# Patient Record
Sex: Male | Born: 1990
Health system: Southern US, Community
[De-identification: ages and names within clinical notes are randomized; demographics above are authoritative.]

## PROBLEM LIST (undated history)

## (undated) DIAGNOSIS — I10 Essential (primary) hypertension: Secondary | ICD-10-CM

## (undated) DIAGNOSIS — Z992 Dependence on renal dialysis: Secondary | ICD-10-CM

## (undated) DIAGNOSIS — D649 Anemia, unspecified: Secondary | ICD-10-CM

## (undated) DIAGNOSIS — N189 Chronic kidney disease, unspecified: Secondary | ICD-10-CM

## (undated) DIAGNOSIS — E039 Hypothyroidism, unspecified: Secondary | ICD-10-CM

## (undated) DIAGNOSIS — J9601 Acute respiratory failure with hypoxia: Secondary | ICD-10-CM

## (undated) DIAGNOSIS — E785 Hyperlipidemia, unspecified: Secondary | ICD-10-CM

## (undated) DIAGNOSIS — E079 Disorder of thyroid, unspecified: Secondary | ICD-10-CM

## (undated) DIAGNOSIS — N186 End stage renal disease: Secondary | ICD-10-CM

## (undated) DIAGNOSIS — T884XXA Failed or difficult intubation, initial encounter: Secondary | ICD-10-CM

## (undated) DIAGNOSIS — K219 Gastro-esophageal reflux disease without esophagitis: Secondary | ICD-10-CM

## (undated) DIAGNOSIS — S31000A Unspecified open wound of lower back and pelvis without penetration into retroperitoneum, initial encounter: Secondary | ICD-10-CM

## (undated) DIAGNOSIS — T8859XA Other complications of anesthesia, initial encounter: Secondary | ICD-10-CM

## (undated) HISTORY — DX: Disorder of thyroid, unspecified: E07.9

## (undated) HISTORY — DX: Essential (primary) hypertension: I10

---
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
## 2012-01-09 ENCOUNTER — Other Ambulatory Visit: Payer: Self-pay | Admitting: *Deleted

## 2012-01-09 MED ORDER — LISINOPRIL-HYDROCHLOROTHIAZIDE 20-12.5 MG PO TABS: 2.0000 | ORAL_TABLET | Freq: Every day | ORAL | Status: DC

## 2012-01-09 NOTE — Telephone Encounter (Signed)
Called and informed mom of Rx sent into pharmacy and that he will need an office visit with PCP for any further refills.Paul [name], [name]

## 2012-01-09 NOTE — Telephone Encounter (Signed)
Will refill x1 month. Call pt and let him know he needs a follow up appointment.  NO further refills unless he is seen by me, or at least has an appt to be seen.

## 2012-02-01 ENCOUNTER — Encounter: Payer: Self-pay | Admitting: Family Medicine

## 2012-02-22 ENCOUNTER — Other Ambulatory Visit: Payer: Self-pay | Admitting: Family Medicine

## 2012-02-22 MED ORDER — LISINOPRIL-HYDROCHLOROTHIAZIDE 20-12.5 MG PO TABS: 2.0000 | ORAL_TABLET | Freq: Every day | ORAL | Status: DC

## 2012-02-22 NOTE — Telephone Encounter (Signed)
Mom is calling for a refill on Lisinopril to got to [facility] on [address].  She has schedule him to be seen on 10/18 and would like some meds to last until then.

## 2012-02-22 NOTE — Telephone Encounter (Signed)
Given 3 week supply.

## 2012-02-22 NOTE — Telephone Encounter (Signed)
Will fwd. RX refill request to [place]. Javier Glazier, Gerrit Heck

## 2012-03-05 ENCOUNTER — Other Ambulatory Visit: Payer: Self-pay | Admitting: *Deleted

## 2012-03-05 DIAGNOSIS — E039 Hypothyroidism, unspecified: Secondary | ICD-10-CM

## 2012-03-05 MED ORDER — LEVOTHYROXINE SODIUM 150 MCG PO TABS: 150.0000 ug | ORAL_TABLET | Freq: Every day | ORAL | Status: DC

## 2012-03-09 ENCOUNTER — Ambulatory Visit: Payer: Medicaid Other | Admitting: Family Medicine

## 2012-03-14 ENCOUNTER — Other Ambulatory Visit: Payer: Self-pay | Admitting: *Deleted

## 2012-03-14 MED ORDER — LISINOPRIL-HYDROCHLOROTHIAZIDE 20-12.5 MG PO TABS: 2.0000 | ORAL_TABLET | Freq: Every day | ORAL | Status: DC

## 2012-03-14 NOTE — Telephone Encounter (Signed)
Patient has appointment scheduled for 03/28/2012 with Dr. Loraine Maple.  Lauralyn Primes

## 2012-03-14 NOTE — Telephone Encounter (Signed)
I will refill until the appt. This is the LAST TIME I will refill, even if he has an appt scheduled, because he has canceled and needs to be seen.  Please let pt and his mother know this. Thanks!

## 2012-03-28 ENCOUNTER — Ambulatory Visit: Payer: Medicaid Other | Admitting: Family Medicine

## 2012-03-30 ENCOUNTER — Telehealth: Payer: Self-pay | Admitting: Family Medicine

## 2012-03-30 NOTE — Telephone Encounter (Signed)
Pt is now out of his Lisinopril and has appt on 11/19 - needs enough to last until appt  [facility] [place]

## 2012-03-30 NOTE — Telephone Encounter (Signed)
Request DENIED.  Patient has no showed multiple times.  NO MEDS WILL BE PRESCRIBED UNTIL HE COMES IN FOR AN APPOINTMENT.

## 2012-04-10 ENCOUNTER — Ambulatory Visit (INDEPENDENT_AMBULATORY_CARE_PROVIDER_SITE_OTHER): Payer: Medicaid Other | Admitting: Family Medicine

## 2012-04-10 ENCOUNTER — Encounter: Payer: Self-pay | Admitting: Family Medicine

## 2012-04-10 VITALS — BP 146/91 | HR 112 | Ht 74.0 in | Wt 380.0 lb

## 2012-04-10 DIAGNOSIS — E785 Hyperlipidemia, unspecified: Secondary | ICD-10-CM

## 2012-04-10 DIAGNOSIS — I10 Essential (primary) hypertension: Secondary | ICD-10-CM

## 2012-04-10 DIAGNOSIS — E119 Type 2 diabetes mellitus without complications: Secondary | ICD-10-CM

## 2012-04-10 LAB — POCT GLYCOSYLATED HEMOGLOBIN (HGB A1C): Hemoglobin A1C: 8.9

## 2012-04-10 LAB — COMPREHENSIVE METABOLIC PANEL
Alkaline Phosphatase: 43 U/L (ref 39–117)
BUN: 14 mg/dL (ref 6–23)
CO2: 23 mEq/L (ref 19–32)
Creat: 1.03 mg/dL (ref 0.50–1.35)
Glucose, Bld: 128 mg/dL — ABNORMAL HIGH (ref 70–99)
Total Bilirubin: 0.2 mg/dL — ABNORMAL LOW (ref 0.3–1.2)

## 2012-04-10 NOTE — Assessment & Plan Note (Addendum)
Now primarily being managed by [facility]; last appt records faxed to [place] was from 01/25/12. Will not make any changes today. A1c unchanged at 8.9.  Pt reports he was supposed to f/u last month but didn't.  Is also not taking victoza, only taking lantus and metformin.

## 2012-04-10 NOTE — Progress Notes (Signed)
S: Pt comes in today for follow up.  HYPERTENSION BP: 146/91 Meds: amlodipine 10, lisinopril/HCTZ 40/25, was supposed to start coreg but mom refused, so now is on metoprolol 50 BID but not taking it Taking meds: Yes : out of lisinopril right now, never taken metoprolol    # of doses missed/week: 0 Symptoms: Headache: No Dizziness: No Vision changes: No SOB:  No Chest pain: No LE swelling: No Tobacco use: No   DIABETES Primarily Managed by [facility] now Home CBGs: AM usually 150's, PM usually 170-230; highest he can remember 260 or 270  Meds: per [facility] note: Lantus, metformin, victoza Taking Meds: yes- not taking Victoza  # of doses missed per week: 0 Hypoglycemic episodes?: no Symptoms: Polyuria: no Polydipsia: no Parasthesias: no   Dizziness: no  Nausea:  no Vomiting:  no Last A1c: TODAY 8.9   HYPERLIPIDEMIA and OBESITY Meds: tricor Muscle aches: no Last FLP or LDL:  Lab Results  Component Value Date   LDLCALC Comment:   Not calculated due to Triglyceride >400. Suggest ordering Direct LDL (Unit Code: [phone]).   Total Cholesterol/HDL Ratio:CHD Risk                        Coronary Heart Disease Risk Table                                        Men       Women          1/2 Average Risk              3.4        3.3              Average Risk              5.0        4.4           2X Average Risk              9.6        7.1           3X Average Risk             23.4       11.0 Use the calculated Patient Ratio above and the CHD Risk table  to determine the patient's CHD Risk. ATP III Classification (LDL):       < 100        mg/dL         Optimal      100 - 129     mg/dL         Near or Above Optimal      130 - 159     mg/dL         Borderline High      160 - 189     mg/dL         High       > 190        mg/dL         Very High   10/05/2011   Lab Results  Component Value Date   CHOL 253* 10/05/2011   HDL 41 10/05/2011   LDLCALC Comment:   Not calculated due to Triglyceride >400. Suggest  ordering Direct LDL (Unit Code: [phone]).   Total Cholesterol/HDL Ratio:CHD Risk  Coronary Heart Disease Risk Table                                        Men       Women          1/2 Average Risk              3.4        3.3              Average Risk              5.0        4.4           2X Average Risk              9.6        7.1           3X Average Risk             23.4       11.0 Use the calculated Patient Ratio above and the CHD Risk table  to determine the patient's CHD Risk. ATP III Classification (LDL):       < 100        mg/dL         Optimal      100 - 129     mg/dL         Near or Above Optimal      130 - 159     mg/dL         Borderline High      160 - 189     mg/dL         High       > 190        mg/dL         Very High   10/05/2011   LDLDIRECT 126* 04/14/2009   TRIG 581* 10/05/2011   CHOLHDL 6.2 10/05/2011    Diet: Trying to stay away from Sodas, drinking mostly Juice and Gatorade  Exercise: "I need to exercise more" hard because its colder out  Weight:  Wt Readings from Last 3 Encounters:  04/10/12 380 lb (172.367 kg) (99.99%*)  10/05/11 362 lb 5 oz (164.344 kg) (99.99%*)  04/12/11 357 lb 12.8 oz (162.297 kg) (99.98%*)   * Growth percentiles are based on CDC 2-20 Years data.       ROS: Per HPI  History  Smoking status  . Never Smoker   Smokeless tobacco  . Never Used    O:  Filed Vitals:   04/10/12 1108  BP: 146/91  Pulse: 112    Gen: NAD, obese CV: RRR, no murmur Pulm: CTA bilat, no wheezes or crackles Ext: Warm, no edema   A/P: 21 y.o. male p/w HTN, DM, HLD, obesity -See problem list -f/u in 1 month

## 2012-04-10 NOTE — Patient Instructions (Signed)
It was good to see you again (finally)!  We are checking some labs, I'll send you a letter with the results.  I am refilling your blood pressure medicine.  Please come back in ONE MONTH. We need to recheck your blood pressure and figure out what to do with your cholesterol and triglycerides.

## 2012-04-10 NOTE — Assessment & Plan Note (Signed)
Slightly improved today even though he has been out of lisino/HCTZ x2 weeks.  Max'ed out on 3 agents (norvasc, lisino/HCTZ) and mom will not allow [place] to start him on BB. Recheck BP in 1 month, although not confident pt will actually follow up.

## 2012-04-10 NOTE — Assessment & Plan Note (Addendum)
Continues with significantly elevated TGs. Will check direct LDL since he has eaten today. Is on Tricor 145mg  (fenofibrate) only at this time.  Will likely add a statin, as LDL will most likely still be >100.  Going to be difficult to get TG's under control while DM is not controlled.

## 2012-04-10 NOTE — Assessment & Plan Note (Signed)
Gained another 20lb in 6 months Is not really willing to listen to advice on diet and exercise or to brainstorm on his own.

## 2012-04-11 ENCOUNTER — Encounter: Payer: Self-pay | Admitting: Family Medicine

## 2012-04-11 ENCOUNTER — Other Ambulatory Visit: Payer: Self-pay | Admitting: *Deleted

## 2012-04-11 ENCOUNTER — Telehealth: Payer: Self-pay | Admitting: Family Medicine

## 2012-04-11 DIAGNOSIS — I10 Essential (primary) hypertension: Secondary | ICD-10-CM

## 2012-04-11 MED ORDER — PRAVASTATIN SODIUM 40 MG PO TABS: 40.0000 mg | ORAL_TABLET | Freq: Every day | ORAL | Status: DC

## 2012-04-11 MED ORDER — AMLODIPINE BESYLATE 10 MG PO TABS: 10.0000 mg | ORAL_TABLET | Freq: Every day | ORAL | Status: DC

## 2012-04-11 NOTE — Telephone Encounter (Signed)
LMOM (cell) - to rt call if this is his cell. (Recording states "Matt"). Please advise of Dr Alphonsa Overall msg.

## 2012-04-11 NOTE — Telephone Encounter (Signed)
Please call patient and let him know that I did send in a statin for his cholesterol.

## 2012-04-12 ENCOUNTER — Telehealth: Payer: Self-pay | Admitting: Family Medicine

## 2012-04-12 MED ORDER — LISINOPRIL-HYDROCHLOROTHIAZIDE 20-12.5 MG PO TABS: 2.0000 | ORAL_TABLET | Freq: Every day | ORAL | Status: DC

## 2012-04-12 NOTE — Telephone Encounter (Signed)
Notified patient.

## 2012-04-12 NOTE — Telephone Encounter (Signed)
Saw doctor the other day and she was supposed to call in his Lisinopril - they are upset that this hasn't been done yet.  [facility] [address]

## 2012-04-12 NOTE — Telephone Encounter (Signed)
Will forward message to Dr. Loraine Maple

## 2012-04-12 NOTE — Telephone Encounter (Signed)
Refilled

## 2012-04-16 ENCOUNTER — Other Ambulatory Visit: Payer: Self-pay | Admitting: *Deleted

## 2012-04-16 DIAGNOSIS — E039 Hypothyroidism, unspecified: Secondary | ICD-10-CM

## 2012-04-16 MED ORDER — LEVOTHYROXINE SODIUM 150 MCG PO TABS: 150.0000 ug | ORAL_TABLET | Freq: Every day | ORAL | Status: DC

## 2012-05-03 ENCOUNTER — Ambulatory Visit: Payer: Medicaid Other | Admitting: Family Medicine

## 2012-06-07 ENCOUNTER — Ambulatory Visit: Payer: Medicaid Other | Admitting: Family Medicine

## 2012-07-13 ENCOUNTER — Telehealth: Payer: Self-pay | Admitting: Family Medicine

## 2012-07-13 DIAGNOSIS — E785 Hyperlipidemia, unspecified: Secondary | ICD-10-CM

## 2012-07-13 DIAGNOSIS — I10 Essential (primary) hypertension: Secondary | ICD-10-CM

## 2012-07-13 MED ORDER — LISINOPRIL-HYDROCHLOROTHIAZIDE 20-12.5 MG PO TABS: 2.0000 | ORAL_TABLET | Freq: Every day | ORAL | Status: DC

## 2012-07-13 MED ORDER — AMLODIPINE BESYLATE 10 MG PO TABS: 10.0000 mg | ORAL_TABLET | Freq: Every day | ORAL | Status: DC

## 2012-07-13 MED ORDER — PRAVASTATIN SODIUM 40 MG PO TABS: 40.0000 mg | ORAL_TABLET | Freq: Every day | ORAL | Status: DC

## 2012-07-13 MED ORDER — TRICOR 145 MG PO TABS: 145.0000 mg | ORAL_TABLET | Freq: Every day | ORAL | Status: DC

## 2012-07-13 MED ORDER — LEVOTHYROXINE SODIUM 150 MCG PO TABS: 150.0000 ug | ORAL_TABLET | Freq: Every day | ORAL | Status: DC

## 2012-07-13 MED ORDER — METOPROLOL TARTRATE 50 MG PO TABS: 50.0000 mg | ORAL_TABLET | Freq: Two times a day (BID) | ORAL | Status: DC

## 2012-07-13 NOTE — Telephone Encounter (Signed)
Fwd. To PCP for review .Paul [name]  

## 2012-07-13 NOTE — Telephone Encounter (Signed)
Patient is completely out of meds and calls to see if he is able to get enough of his meds until his appt that is sch'd for next Friday 2/28.

## 2012-07-13 NOTE — Telephone Encounter (Signed)
Called pt. No answer. No voice mail. See Dr.McGill's message. Paul [name], Paul [name]

## 2012-07-13 NOTE — Telephone Encounter (Signed)
This should be coming through the pharmacy. I will refill them, next time he needs to call his pharmacy and have refill requests sent.   He needs to keep his appt.   I filled HTN and HLD meds only.  He needs DM medications filled through endocrinologist as I no longer see him for his DM per his mother's request.

## 2012-07-20 ENCOUNTER — Ambulatory Visit: Payer: Medicaid Other | Admitting: Family Medicine

## 2012-08-13 ENCOUNTER — Telehealth: Payer: Self-pay | Admitting: Family Medicine

## 2012-08-13 NOTE — Telephone Encounter (Signed)
Left message to return call. Per Dr Loraine Maple this patient Paul [name] have any refills until his appointment. He has no showed 6 out of the last 7 appointments.Paul [name], Paul [name]

## 2012-08-13 NOTE — Telephone Encounter (Signed)
Patient has an appointment for 4/2 but is out of his Lisinopril.  Caregiver is asking for enough of the Lisinopril to last until that appointment be sent to their pharmacy.

## 2012-08-14 NOTE — Telephone Encounter (Signed)
appt 08/22/12 with PCP. Marland KitchenMauricia [name]

## 2012-08-22 ENCOUNTER — Ambulatory Visit (INDEPENDENT_AMBULATORY_CARE_PROVIDER_SITE_OTHER): Payer: Medicaid Other | Admitting: Family Medicine

## 2012-08-22 ENCOUNTER — Telehealth: Payer: Self-pay | Admitting: *Deleted

## 2012-08-22 VITALS — BP 163/107 | HR 109 | Temp 97.7°F | Ht 74.0 in | Wt 383.0 lb

## 2012-08-22 DIAGNOSIS — IMO0002 Reserved for concepts with insufficient information to code with codable children: Secondary | ICD-10-CM

## 2012-08-22 DIAGNOSIS — E1165 Type 2 diabetes mellitus with hyperglycemia: Secondary | ICD-10-CM

## 2012-08-22 DIAGNOSIS — I10 Essential (primary) hypertension: Secondary | ICD-10-CM

## 2012-08-22 DIAGNOSIS — E785 Hyperlipidemia, unspecified: Secondary | ICD-10-CM

## 2012-08-22 MED ORDER — AMLODIPINE BESYLATE 10 MG PO TABS: 10.0000 mg | ORAL_TABLET | Freq: Every day | ORAL | Status: DC

## 2012-08-22 MED ORDER — LISINOPRIL-HYDROCHLOROTHIAZIDE 20-12.5 MG PO TABS: 2.0000 | ORAL_TABLET | Freq: Every day | ORAL | Status: DC

## 2012-08-22 MED ORDER — ATORVASTATIN CALCIUM 20 MG PO TABS: 20.0000 mg | ORAL_TABLET | Freq: Every day | ORAL | Status: DC

## 2012-08-22 NOTE — Telephone Encounter (Signed)
Patient completed PCP change request form.  Form states "prefer to have male doctor.  Not comfortable with females."  Discussed with Dr. Loraine Maple.  Called patient to discuss options.  Patient requesting an "African American" doctor.  Informed patient that only [facility] doctor is male and will be graduating in June.  Patient states he will "just stay with Dr. Loraine Maple for now."

## 2012-08-22 NOTE — Assessment & Plan Note (Signed)
[facility] is managing- per pt A1c was 8.9; on lantus 70 BID, met 1000 BID. Pt refuses victoza.

## 2012-08-22 NOTE — Patient Instructions (Addendum)
It was good to see you today.  I am sending in a different statin (cholesterol medicine) and refilling your blood pressure medicines.  Schedule an eye appointment!  It's very important to have your eyes checked every year, especially since you have diabetes.  I'm glad to hear your A1c is getting better!  Come back to see me on May 20th in the morning-- have the ladies up front schedule you in the cross cover clinic.  We will check your cholesterol and kidneys at this appointment so don't eat anything before you come!

## 2012-08-22 NOTE — Assessment & Plan Note (Signed)
Out of medicines, so elevated today. Refilled lisino/HCTZ and norvasc. F/u in 6 weeks for recheck and will check CMET.

## 2012-08-22 NOTE — Assessment & Plan Note (Signed)
Did not tolerate pravastatin due to GI upset, will try Lipitor instead. Check FLP in 6 weeks at f/u

## 2012-08-22 NOTE — Progress Notes (Signed)
S: Pt comes in today for follow up.  HYPERTENSION BP: 163/107 Meds: amlodipine 10, lisino/HCTZ 40/25, metoprolol 50 BID  Taking meds: Yes not taking metoprolol     # of doses missed/week: 0; has been out for a week Symptoms: Headache: No Dizziness: No Vision changes: No SOB:  No Chest pain: No LE swelling: No Tobacco use: No   DIABETES Being followed by [name] per mother's request for specialist.  Home CBGs: sometimes checks; supposed to do it 1x/day, was 107 yesterday at [place]: per [facility]. Lantus 70 BID, metformin 1000 BID; not taking Victoza, [facility] aware  Taking Meds: yes # of doses missed per week: 0 Hypoglycemic episodes?: no Symptoms: Polyuria: no Polydipsia: no Parasthesias: no   Dizziness: no  Nausea:  no Vomiting:  no Last A1c: per pt, 8.9 at [facility]    HYPERLIPIDEMIA  Meds: prava 40, tricor 140; not taking pravastatin b/c of GI upset  Muscle aches: no Last FLP or LDL:  Lab Results  Component Value Date   LDLCALC Comment:   Not calculated due to Triglyceride >400. Suggest ordering Direct LDL (Unit Code: [phone]).   Total Cholesterol/HDL Ratio:CHD Risk                        Coronary Heart Disease Risk Table                                        Men       Women          1/2 Average Risk              3.4        3.3              Average Risk              5.0        4.4           2X Average Risk              9.6        7.1           3X Average Risk             23.4       11.0 Use the calculated Patient Ratio above and the CHD Risk table  to determine the patient's CHD Risk. ATP III Classification (LDL):       < 100        mg/dL         Optimal      100 - 129     mg/dL         Near or Above Optimal      130 - 159     mg/dL         Borderline High      160 - 189     mg/dL         High       > 190        mg/dL         Very High   10/05/2011   Lab Results  Component Value Date   CHOL 253* 10/05/2011   HDL 41 10/05/2011   LDLCALC Comment:   Not calculated due to Triglyceride >400.  Suggest ordering Direct LDL (Unit Code: [phone]).  Total Cholesterol/HDL Ratio:CHD Risk                        Coronary Heart Disease Risk Table                                        Men       Women          1/2 Average Risk              3.4        3.3              Average Risk              5.0        4.4           2X Average Risk              9.6        7.1           3X Average Risk             23.4       11.0 Use the calculated Patient Ratio above and the CHD Risk table  to determine the patient's CHD Risk. ATP III Classification (LDL):       < 100        mg/dL         Optimal      100 - 129     mg/dL         Near or Above Optimal      130 - 159     mg/dL         Borderline High      160 - 189     mg/dL         High       > 190        mg/dL         Very High   10/05/2011   LDLDIRECT 181* 04/10/2012   TRIG 581* 10/05/2011   CHOLHDL 6.2 10/05/2011    Diet: Exercise:  Weight:  Wt Readings from Last 3 Encounters:  08/22/12 383 lb (173.728 kg)  04/10/12 380 lb (172.367 kg) (100%*, Z = 3.78)  10/05/11 362 lb 5 oz (164.344 kg) (100%*, Z = 3.62)   * Growth percentiles are based on CDC 2-20 Years data.      ROS: Per HPI  History  Smoking status  . Never Smoker   Smokeless tobacco  . Never Used    O:  Filed Vitals:   08/22/12 0847  BP: 163/107  Pulse: 109  Temp: 97.7 F (36.5 C)    Gen: NAD, morbidly obese  CV: RRR, no murmur Pulm: CTA bilat, no wheezes or crackles Ext: Warm, no edema   A/P: 22 y.o. male p/w HTN , DM, HLD -See problem list -f/u in 6 weeks for BP recheck and labs

## 2012-08-27 ENCOUNTER — Encounter: Payer: Self-pay | Admitting: Family Medicine

## 2012-10-09 ENCOUNTER — Ambulatory Visit: Payer: Medicaid Other

## 2012-10-27 ENCOUNTER — Other Ambulatory Visit: Payer: Self-pay | Admitting: Family Medicine

## 2012-11-01 ENCOUNTER — Encounter: Payer: Self-pay | Admitting: Family Medicine

## 2012-11-01 ENCOUNTER — Ambulatory Visit (INDEPENDENT_AMBULATORY_CARE_PROVIDER_SITE_OTHER): Payer: Medicaid Other | Admitting: Family Medicine

## 2012-11-01 VITALS — BP 182/118 | HR 105 | Ht 74.0 in | Wt 382.0 lb

## 2012-11-01 DIAGNOSIS — I10 Essential (primary) hypertension: Secondary | ICD-10-CM

## 2012-11-01 DIAGNOSIS — E1165 Type 2 diabetes mellitus with hyperglycemia: Secondary | ICD-10-CM

## 2012-11-01 DIAGNOSIS — E785 Hyperlipidemia, unspecified: Secondary | ICD-10-CM

## 2012-11-01 LAB — COMPREHENSIVE METABOLIC PANEL
AST: 13 U/L (ref 0–37)
Alkaline Phosphatase: 46 U/L (ref 39–117)
BUN: 15 mg/dL (ref 6–23)
Glucose, Bld: 172 mg/dL — ABNORMAL HIGH (ref 70–99)
Potassium: 4.2 mEq/L (ref 3.5–5.3)
Sodium: 140 mEq/L (ref 135–145)
Total Bilirubin: 0.3 mg/dL (ref 0.3–1.2)
Total Protein: 7.3 g/dL (ref 6.0–8.3)

## 2012-11-01 MED ORDER — AMLODIPINE BESYLATE 10 MG PO TABS: 10.0000 mg | ORAL_TABLET | Freq: Every day | ORAL | Status: DC

## 2012-11-01 MED ORDER — TRICOR 145 MG PO TABS: 145.0000 mg | ORAL_TABLET | Freq: Every day | ORAL | Status: DC

## 2012-11-01 MED ORDER — ATORVASTATIN CALCIUM 20 MG PO TABS: 20.0000 mg | ORAL_TABLET | Freq: Every day | ORAL | Status: DC

## 2012-11-01 MED ORDER — LISINOPRIL-HYDROCHLOROTHIAZIDE 20-12.5 MG PO TABS: 2.0000 | ORAL_TABLET | Freq: Every day | ORAL | Status: DC

## 2012-11-01 NOTE — Patient Instructions (Addendum)
It was good to see you today.  Good luck with everything!  I am giving you 3 months supply of your blood pressure medicine and a 1 year supply for your cholesterol medicines.  We checked some labs today-- I'll send you a letter with the results.   Try to go to [facility] or [facility] and check your blood pressure a few times in the next 2 weeks.  Then, call and tell me what those numbers are.  We can adjust your medicines if needed so you don't have to come back as soon.  Plan on coming back in 2-3 months to meet your new doctor and get your next set of refills.

## 2012-11-01 NOTE — Assessment & Plan Note (Addendum)
Not fasting today. Check LDL. Cont lipitor 20, fenofibrate 145  ADDENDUM: LDL remains elevated, will increase lipitor to 40mg , new Rx sent in and pt notified.

## 2012-11-01 NOTE — Assessment & Plan Note (Signed)
Followed by [name].

## 2012-11-01 NOTE — Assessment & Plan Note (Signed)
Elevated again today but off medicines. Having a difficult time getting him to come in for follow up unless he is totally out of his medicines.  Will try having him check BPs at home and call in the next 2 weeks so I can make med adjustments over the phone.  He will need a 4th agent added since already on norvasc 10 and lisino/HCTZ 40/25.  Mom will not let him so a BB so may want to try splitting lisino/HCTZ to lisino 80 + chlorthalidone 50 or 100.  Could also consider adding spironolactone if K is low (<4).  Check CMET today.  Counseled extensively on the risks of continuing to have BPs this elevated including early heart attack, stroke, blindness, impotence, kidney damage.

## 2012-11-01 NOTE — Progress Notes (Signed)
S: Pt comes in today for follow up.  HYPERTENSION BP: 182/118 Doesn't check BPs at home but says Dr. Alfonso Ramus always tells him it's good Meds: norvasc 10, lisino/HCTZ 40/25 (metoprolol 50 BID but does not take this because he mother does not want him to) Taking meds: Yes but has been out x1 week    # of doses missed/week: 0 usually (uses a pill box) Symptoms: Headache: No Dizziness: No Vision changes: No SOB:  No Chest pain: No LE swelling: No Tobacco use: No   DIABETES Followed by [name], will defer to them for treatment per mother's request. He reports his next appointment there is 11/20/12 Currently taking lantus 70 + metformin, refuses Victoza    ROS: Per HPI  History  Smoking status  . Never Smoker   Smokeless tobacco  . Never Used    O:  Filed Vitals:   11/01/12 0852  BP: 182/118  Pulse: 105    Gen: NAD, morbidly obese CV: RRR, no murmur Pulm: CTA bilat, no wheezes or crackles Ext: Warm, no edema   A/P: 22 y.o. male p/w HTN, DM managed by endocrine  -See problem list -f/u in 1 month

## 2012-11-02 ENCOUNTER — Encounter: Payer: Self-pay | Admitting: Family Medicine

## 2012-11-02 MED ORDER — ATORVASTATIN CALCIUM 40 MG PO TABS: 40.0000 mg | ORAL_TABLET | Freq: Every day | ORAL | Status: DC

## 2012-11-02 NOTE — Addendum Note (Signed)
Addended by: Lorin Glass A on: 11/02/2012 08:28 AM   Modules accepted: Orders

## 2012-11-14 ENCOUNTER — Telehealth: Payer: Self-pay | Admitting: *Deleted

## 2012-11-14 NOTE — Telephone Encounter (Signed)
Spoke with patient and he has not gone to check his blood pressures.  He does not have a car.  Barett Whidbee, Loralyn [place], [place]

## 2012-11-14 NOTE — Telephone Encounter (Signed)
Message copied by Lazaro Arms on Wed Nov 14, 2012  4:50 PM ------      Message from: Lorin Glass A      Created: Wed Nov 14, 2012 11:28 AM       Please call pt and find out what his BPs have been running over the past few weeks-- I asked him to call me with these results to help titrate his medications. Thanks! ------

## 2012-11-29 ENCOUNTER — Other Ambulatory Visit: Payer: Self-pay

## 2012-12-13 ENCOUNTER — Telehealth: Payer: Self-pay | Admitting: Family Medicine

## 2012-12-13 NOTE — Telephone Encounter (Signed)
Pt was just transferred to Dr. Teryl Lucy mother requested and [facility] approved. He has an appointment to see him 8/22 for med refill. Alann will be out medication before then, so can we give some refill's to last him till his visit. Dahl's mother only wants her son to see Dr. Teryl Lucy. JW

## 2012-12-13 NOTE — Telephone Encounter (Signed)
Please advise. Paul [name] S  

## 2012-12-14 ENCOUNTER — Other Ambulatory Visit: Payer: Self-pay | Admitting: Family Medicine

## 2012-12-14 DIAGNOSIS — E785 Hyperlipidemia, unspecified: Secondary | ICD-10-CM

## 2012-12-14 DIAGNOSIS — I10 Essential (primary) hypertension: Secondary | ICD-10-CM

## 2012-12-14 DIAGNOSIS — E119 Type 2 diabetes mellitus without complications: Secondary | ICD-10-CM

## 2012-12-14 DIAGNOSIS — E039 Hypothyroidism, unspecified: Secondary | ICD-10-CM

## 2012-12-14 MED ORDER — AMLODIPINE BESYLATE 10 MG PO TABS: 10.0000 mg | ORAL_TABLET | Freq: Every day | ORAL | Status: DC

## 2012-12-14 MED ORDER — LISINOPRIL-HYDROCHLOROTHIAZIDE 20-12.5 MG PO TABS: 2.0000 | ORAL_TABLET | Freq: Every day | ORAL | Status: DC

## 2012-12-14 MED ORDER — ACCU-CHEK SOFT TOUCH LANCETS MISC: Status: DC

## 2012-12-14 MED ORDER — METFORMIN HCL 1000 MG PO TABS: 1000.0000 mg | ORAL_TABLET | Freq: Two times a day (BID) | ORAL | Status: DC

## 2012-12-14 MED ORDER — GLUCOSE BLOOD VI STRP: ORAL_STRIP | Status: DC

## 2012-12-14 MED ORDER — LEVOTHYROXINE SODIUM 150 MCG PO TABS: 150.0000 ug | ORAL_TABLET | Freq: Every day | ORAL | Status: DC

## 2012-12-14 MED ORDER — TRICOR 145 MG PO TABS: 145.0000 mg | ORAL_TABLET | Freq: Every day | ORAL | Status: DC

## 2012-12-14 MED ORDER — ATORVASTATIN CALCIUM 40 MG PO TABS: 40.0000 mg | ORAL_TABLET | Freq: Every day | ORAL | Status: DC

## 2012-12-14 MED ORDER — INSULIN GLARGINE 100 UNIT/ML ~~LOC~~ SOLN: SUBCUTANEOUS | Status: DC

## 2012-12-14 NOTE — Telephone Encounter (Signed)
Hey Ms. Proposito!  I refilled Mr. Hendershot medications for one month with no refills. That should be enough to last him until he sees me next month. I'm not sure if this goes to you or someone else, but thanks!  Cordelia Poche, MD

## 2012-12-17 NOTE — Telephone Encounter (Signed)
Left message on voicemail. Paul [name] S  

## 2013-01-11 ENCOUNTER — Encounter: Payer: Self-pay | Admitting: Family Medicine

## 2013-01-11 ENCOUNTER — Ambulatory Visit (INDEPENDENT_AMBULATORY_CARE_PROVIDER_SITE_OTHER): Payer: Medicaid Other | Admitting: Family Medicine

## 2013-01-11 VITALS — BP 170/99 | HR 120 | Temp 98.8°F | Ht 74.0 in | Wt 371.2 lb

## 2013-01-11 DIAGNOSIS — E1165 Type 2 diabetes mellitus with hyperglycemia: Secondary | ICD-10-CM

## 2013-01-11 DIAGNOSIS — E039 Hypothyroidism, unspecified: Secondary | ICD-10-CM

## 2013-01-11 DIAGNOSIS — E119 Type 2 diabetes mellitus without complications: Secondary | ICD-10-CM

## 2013-01-11 DIAGNOSIS — E785 Hyperlipidemia, unspecified: Secondary | ICD-10-CM

## 2013-01-11 DIAGNOSIS — I10 Essential (primary) hypertension: Secondary | ICD-10-CM

## 2013-01-11 DIAGNOSIS — IMO0002 Reserved for concepts with insufficient information to code with codable children: Secondary | ICD-10-CM

## 2013-01-11 MED ORDER — AMLODIPINE BESYLATE 10 MG PO TABS: 10.0000 mg | ORAL_TABLET | Freq: Every day | ORAL | Status: DC

## 2013-01-11 MED ORDER — TRICOR 145 MG PO TABS: 145.0000 mg | ORAL_TABLET | Freq: Every day | ORAL | Status: DC

## 2013-01-11 MED ORDER — ATORVASTATIN CALCIUM 40 MG PO TABS: 40.0000 mg | ORAL_TABLET | Freq: Every day | ORAL | Status: DC

## 2013-01-11 MED ORDER — HYDRALAZINE HCL 10 MG PO TABS: 10.0000 mg | ORAL_TABLET | Freq: Three times a day (TID) | ORAL | Status: DC

## 2013-01-11 MED ORDER — LISINOPRIL-HYDROCHLOROTHIAZIDE 20-12.5 MG PO TABS: 2.0000 | ORAL_TABLET | Freq: Every day | ORAL | Status: DC

## 2013-01-11 MED ORDER — METFORMIN HCL 1000 MG PO TABS: 1000.0000 mg | ORAL_TABLET | Freq: Two times a day (BID) | ORAL | Status: DC

## 2013-01-11 MED ORDER — LEVOTHYROXINE SODIUM 150 MCG PO TABS: 150.0000 ug | ORAL_TABLET | Freq: Every day | ORAL | Status: DC

## 2013-01-11 MED ORDER — ACCU-CHEK SOFT TOUCH LANCETS MISC: Status: DC

## 2013-01-11 MED ORDER — GLUCOSE BLOOD VI STRP: ORAL_STRIP | Status: DC

## 2013-01-11 MED ORDER — INSULIN GLARGINE 100 UNIT/ML ~~LOC~~ SOLN: SUBCUTANEOUS | Status: DC

## 2013-01-11 NOTE — Progress Notes (Signed)
  Subjective:    Patient ID: Paul [name], male    DOB: [birth year], 22 y.o.   MRN: [id number]  Diabetes He presents for his follow-up diabetic visit. He has type 2 diabetes mellitus. There are no hypoglycemic associated symptoms. Pertinent negatives for hypoglycemia include no headaches. Pertinent negatives for diabetes include no blurred vision, no chest pain, no foot paresthesias, no foot ulcerations and no visual change. There are no diabetic complications. Risk factors for coronary artery disease include diabetes mellitus, hypertension, male sex, sedentary lifestyle, family history, dyslipidemia and obesity. He is compliant with treatment all of the time. He rarely participates in exercise. He does not see a podiatrist.Eye exam is not current.  Hypertension Pertinent negatives include no blurred vision, chest pain, headaches or shortness of breath.      Review of Systems  Eyes: Negative for blurred vision.  Respiratory: Negative for shortness of breath.   Cardiovascular: Negative for chest pain.  Musculoskeletal: Negative for myalgias.  Neurological: Negative for light-headedness, numbness and headaches.       Objective:   Physical Exam  Vitals reviewed. Constitutional: He appears well-developed.  Eyes:  Fundoscopic exam:      The right eye shows no AV nicking, no exudate and no hemorrhage.       The left eye shows no AV nicking, no exudate and no hemorrhage.  Cardiovascular: Regular rhythm, S1 normal and S2 normal.  Tachycardia present.   Pulmonary/Chest: Effort normal and breath sounds normal. No respiratory distress. He has no wheezes. He exhibits no tenderness.  Musculoskeletal: Normal range of motion. He exhibits no edema and no tenderness.  Neurological: He is alert. No sensory deficit.  Negative monofilament test  Skin:  No foot ulcers          Assessment & Plan:

## 2013-01-11 NOTE — Assessment & Plan Note (Signed)
BP continues to not be at goal. He is not having any symptoms. He says he is taking his medication. He will be more mindful about healthy food options, including low sodium meals. He seems to understand the consequences of his high blood pressure, including stroke, loss of eye sight, heart attack.I started hydralazine 10mg  TID as a new medication and will follow-up in one month. He will come back on Tuesday, 8/26, for a basic metabolic panel and TSH.

## 2013-01-11 NOTE — Patient Instructions (Addendum)
Hey Mr. Huneke, it was a pleasure meeting you today. We spoke about your high blood pressure and your diabetes. We decided that we would start you on a new drug called Hydralazine, which you will take three times per day. Please continue taking your other medications as prescribed. Please incorporate the lifestyle changes that we spoke about, including healthier eating, daily exercise and generally being more mindful of your health. If you have any questions, please do not hesitate to call our office. Please make an appointment for 4 weeks.  Sincerely, Cordelia Poche, MD

## 2013-01-11 NOTE — Assessment & Plan Note (Signed)
Currently followed by Dr. Buddy Duty at [facility]. Expressed to patient the important of keeping regular appointments. A1C improved to 8.1 from 8.9 last on 03/2012.

## 2013-04-25 ENCOUNTER — Telehealth: Payer: Self-pay | Admitting: Family Medicine

## 2013-04-25 NOTE — Telephone Encounter (Signed)
Refill request for Lisinopril. Appt made for 12/15 @ 2pm

## 2013-04-27 ENCOUNTER — Telehealth: Payer: Self-pay | Admitting: Family Medicine

## 2013-04-27 DIAGNOSIS — I10 Essential (primary) hypertension: Secondary | ICD-10-CM

## 2013-04-28 ENCOUNTER — Other Ambulatory Visit: Payer: Self-pay | Admitting: Family Medicine

## 2013-04-28 MED ORDER — LISINOPRIL-HYDROCHLOROTHIAZIDE 20-12.5 MG PO TABS: 2.0000 | ORAL_TABLET | Freq: Every day | ORAL | Status: DC

## 2013-04-28 NOTE — Telephone Encounter (Signed)
Medication refilled

## 2013-04-28 NOTE — Telephone Encounter (Signed)
I have refilled Paul [name] lisinopril-hctz via calling in to pharmacy. He has an appointment with me on 12/15.

## 2013-04-30 ENCOUNTER — Ambulatory Visit: Payer: Medicaid Other | Admitting: Family Medicine

## 2013-05-01 NOTE — Telephone Encounter (Signed)
Medication already called in, filled and picked up by patient (confirmed by pharmacy). Refused duplicate request by pharmacy.

## 2013-05-06 ENCOUNTER — Ambulatory Visit (INDEPENDENT_AMBULATORY_CARE_PROVIDER_SITE_OTHER): Payer: Medicaid Other | Admitting: Family Medicine

## 2013-05-06 ENCOUNTER — Encounter: Payer: Self-pay | Admitting: Family Medicine

## 2013-05-06 VITALS — BP 110/40 | HR 101 | Temp 98.0°F | Ht 74.0 in | Wt 349.7 lb

## 2013-05-06 DIAGNOSIS — E039 Hypothyroidism, unspecified: Secondary | ICD-10-CM

## 2013-05-06 DIAGNOSIS — I10 Essential (primary) hypertension: Secondary | ICD-10-CM

## 2013-05-06 DIAGNOSIS — E785 Hyperlipidemia, unspecified: Secondary | ICD-10-CM

## 2013-05-06 DIAGNOSIS — IMO0002 Reserved for concepts with insufficient information to code with codable children: Secondary | ICD-10-CM

## 2013-05-06 DIAGNOSIS — E1165 Type 2 diabetes mellitus with hyperglycemia: Secondary | ICD-10-CM

## 2013-05-06 DIAGNOSIS — E119 Type 2 diabetes mellitus without complications: Secondary | ICD-10-CM

## 2013-05-06 MED ORDER — METFORMIN HCL 1000 MG PO TABS: 1000.0000 mg | ORAL_TABLET | Freq: Two times a day (BID) | ORAL | Status: DC

## 2013-05-06 MED ORDER — INSULIN GLARGINE 100 UNIT/ML ~~LOC~~ SOLN: SUBCUTANEOUS | Status: DC

## 2013-05-06 MED ORDER — LISINOPRIL-HYDROCHLOROTHIAZIDE 20-12.5 MG PO TABS: 2.0000 | ORAL_TABLET | Freq: Every day | ORAL | Status: DC

## 2013-05-06 MED ORDER — ATORVASTATIN CALCIUM 40 MG PO TABS: 40.0000 mg | ORAL_TABLET | Freq: Every day | ORAL | Status: DC

## 2013-05-06 MED ORDER — LEVOTHYROXINE SODIUM 150 MCG PO TABS: 150.0000 ug | ORAL_TABLET | Freq: Every day | ORAL | Status: DC

## 2013-05-06 MED ORDER — ACCU-CHEK SOFT TOUCH LANCETS MISC: Status: DC

## 2013-05-06 MED ORDER — AMLODIPINE BESYLATE 10 MG PO TABS: 10.0000 mg | ORAL_TABLET | Freq: Every day | ORAL | Status: DC

## 2013-05-06 NOTE — Patient Instructions (Addendum)
It was great seeing you again, Paul [name]. Today we spoke about your high blood pressure, diabetes and your obesity. Great job with your weight. Continue to make healthy food choices and increase physical activity. Regarding your diabetes, we will get an A1C today and most likely start you on a per-meal dose of a new insulin called Novolog (short acting). With your losing weight and getting healthier overall, hopefully we can decrease the amount of insulin altogether. I will get some blood work to check your kidneys and your cholesterol. Please make an appointment for the lab some time this week to get those labs done. Please do not eat before getting your labs. Regarding your blood pressure, I will discontinue the hydralazine since your blood pressure seems to be better controlled after I got a manual reading. If you have any symptoms of dizziness, vision changes or loss of consciousness, please call the office to see me. Please see me in one month for a follow-up.  Sincerely,  Cordelia Poche

## 2013-05-07 NOTE — Progress Notes (Signed)
   Subjective:    Patient ID: Paul [name], male    DOB: [birth year], 22 y.o.   MRN: [id number]  HPI  Hypertension Mr. Paul [name] currently takes his medication as prescribed. He has not had any side effects from any medications. He has no headaches, chest pain, change in vision. He does not currently check his blood pressure at home.  Diabetes Patient generally has CBGs in 150s-160s. He currently takes his medication as prescribed. He checks his blood sugars twice a day but is not always consistent. He repots no hypoglycemic episodes.  Hypercholesterolemia/hypertriglyceridemia Taking atorvastatin and Tricor as prescribed. He endorses no side effects.  Obesity Mr. Paul [name] is happy to tell me today that he has lost just over 21lbs. He says he is eating healthier and drinking more water and less soda. He feels better overall and notices he has more energy. He plans to continue losing weight.  Review of Systems  Constitutional: Negative for chills.  Cardiovascular: Negative for chest pain and leg swelling.  Neurological: Negative for dizziness, tremors, seizures, syncope, weakness and headaches.       Objective:   Physical Exam  Vitals reviewed. Constitutional: He is oriented to person, place, and time. He appears well-developed and well-nourished.  Cardiovascular: Normal rate, regular rhythm and normal heart sounds.   Manual BP: 110/40  Pulmonary/Chest: Effort normal and breath sounds normal. No respiratory distress. He has no wheezes.  Neurological: He is alert and oriented to person, place, and time.  Monofilament test negative  Skin: Skin is warm and dry. No lesion noted.          Assessment & Plan:

## 2013-05-08 NOTE — Assessment & Plan Note (Signed)
He has lost about 21lbs in almost 4 months. Continue to encourage safe weigh tloss through diet and behavior modification.

## 2013-05-08 NOTE — Assessment & Plan Note (Signed)
Initial BP from automatic cuff showed high blood pressure. Manual reading was significantly lower. Patient not having symptoms, but would like blood pressure to not be so low (110/40). Will discontinue hydralzine and follow-up blood pressure at next visit.

## 2013-05-08 NOTE — Assessment & Plan Note (Signed)
Will obtain fasting lipid panel and follow-up

## 2013-05-08 NOTE — Assessment & Plan Note (Signed)
Will continue current regimen of metformin and lantus. Will obtain A1C and most likely start meal coverage to balance long vs short acting insulin. Will be mindful of patient's weight loss and potential benefits to insulin resistance achieved, possibly requiring less insulin.

## 2013-06-28 ENCOUNTER — Telehealth: Payer: Self-pay | Admitting: Family Medicine

## 2013-06-28 NOTE — Telephone Encounter (Signed)
Insurance no longer will pay for Tricor and patient will need a generic called in.

## 2013-07-02 MED ORDER — FENOFIBRATE 145 MG PO TABS: 145.0000 mg | ORAL_TABLET | Freq: Every day | ORAL | Status: DC

## 2013-07-02 NOTE — Telephone Encounter (Signed)
LVM for patient to call back. ?

## 2013-07-03 ENCOUNTER — Telehealth: Payer: Self-pay | Admitting: *Deleted

## 2013-07-03 NOTE — Telephone Encounter (Signed)
Prior Authorization received from [facility] for Fenofibrate 145 Mg. Formulary and PA form placed in provider box for completion. Derl Barrow, RN

## 2013-07-04 ENCOUNTER — Other Ambulatory Visit: Payer: Self-pay | Admitting: Family Medicine

## 2013-07-12 MED ORDER — GEMFIBROZIL 600 MG PO TABS: 600.0000 mg | ORAL_TABLET | Freq: Two times a day (BID) | ORAL | Status: DC

## 2013-07-12 NOTE — Telephone Encounter (Signed)
Gemfibrozil is on patient's insurance drug preferred list. Have written a prescription for new medication and sent to pharmacy.

## 2013-07-12 NOTE — Telephone Encounter (Signed)
Unable to reach patient,phone # is a Educational psychologist number and the emergancy  contact number is out of service.please advise. Paul [name], Paul [name]

## 2013-07-17 ENCOUNTER — Other Ambulatory Visit: Payer: Self-pay | Admitting: Family Medicine

## 2013-07-22 ENCOUNTER — Telehealth: Payer: Self-pay | Admitting: *Deleted

## 2013-07-22 NOTE — Telephone Encounter (Signed)
Unable to reach patient phone number not update. Gerrick Ray, Lewie Loron

## 2013-09-09 ENCOUNTER — Other Ambulatory Visit: Payer: Self-pay | Admitting: *Deleted

## 2013-09-09 DIAGNOSIS — E119 Type 2 diabetes mellitus without complications: Secondary | ICD-10-CM

## 2013-09-12 ENCOUNTER — Other Ambulatory Visit: Payer: Self-pay | Admitting: Family Medicine

## 2013-09-12 ENCOUNTER — Telehealth: Payer: Self-pay | Admitting: Family Medicine

## 2013-09-12 MED ORDER — INSULIN GLARGINE 100 UNIT/ML ~~LOC~~ SOLN: SUBCUTANEOUS | Status: DC

## 2013-09-12 NOTE — Telephone Encounter (Signed)
Patient needs refill on insulin. Has made an appt for 5/5 at 9 am but will need at least enough to last him until this appt. Please advise.

## 2013-09-13 NOTE — Telephone Encounter (Signed)
Refill request by pharmacy responded to. Patient can pick up his prescription at his pharmacy.

## 2013-09-24 ENCOUNTER — Ambulatory Visit: Payer: Medicaid Other | Admitting: Family Medicine

## 2013-10-04 ENCOUNTER — Ambulatory Visit (INDEPENDENT_AMBULATORY_CARE_PROVIDER_SITE_OTHER): Payer: Medicaid Other | Admitting: Family Medicine

## 2013-10-04 ENCOUNTER — Encounter: Payer: Self-pay | Admitting: Family Medicine

## 2013-10-04 VITALS — BP 148/75 | HR 87 | Temp 99.3°F | Ht 74.0 in | Wt 342.0 lb

## 2013-10-04 DIAGNOSIS — E039 Hypothyroidism, unspecified: Secondary | ICD-10-CM

## 2013-10-04 DIAGNOSIS — E785 Hyperlipidemia, unspecified: Secondary | ICD-10-CM

## 2013-10-04 DIAGNOSIS — IMO0002 Reserved for concepts with insufficient information to code with codable children: Secondary | ICD-10-CM

## 2013-10-04 DIAGNOSIS — E119 Type 2 diabetes mellitus without complications: Secondary | ICD-10-CM

## 2013-10-04 DIAGNOSIS — I10 Essential (primary) hypertension: Secondary | ICD-10-CM

## 2013-10-04 DIAGNOSIS — IMO0001 Reserved for inherently not codable concepts without codable children: Secondary | ICD-10-CM

## 2013-10-04 DIAGNOSIS — E1165 Type 2 diabetes mellitus with hyperglycemia: Secondary | ICD-10-CM

## 2013-10-04 LAB — POCT GLYCOSYLATED HEMOGLOBIN (HGB A1C): Hemoglobin A1C: 5.9

## 2013-10-04 MED ORDER — ACCU-CHEK SOFT TOUCH LANCETS MISC: Status: DC

## 2013-10-04 MED ORDER — AMLODIPINE BESYLATE 10 MG PO TABS: 10.0000 mg | ORAL_TABLET | Freq: Every day | ORAL | Status: DC

## 2013-10-04 MED ORDER — METFORMIN HCL 1000 MG PO TABS: 1000.0000 mg | ORAL_TABLET | Freq: Two times a day (BID) | ORAL | Status: DC

## 2013-10-04 MED ORDER — LEVOTHYROXINE SODIUM 150 MCG PO TABS: 150.0000 ug | ORAL_TABLET | Freq: Every day | ORAL | Status: DC

## 2013-10-04 MED ORDER — ATORVASTATIN CALCIUM 40 MG PO TABS: 40.0000 mg | ORAL_TABLET | Freq: Every day | ORAL | Status: DC

## 2013-10-04 NOTE — Patient Instructions (Signed)
Paul [name], it was a pleasure seeing you today. Today we talked about your high blood pressure and diabetes. I am not making any changes to your medications today. Please see me in 4 weeks for a physical exam.   If you have any questions or concerns, please do not hesitate to call the office at [phone].  Sincerely,  Cordelia Poche, MD

## 2013-10-07 ENCOUNTER — Telehealth: Payer: Self-pay | Admitting: Family Medicine

## 2013-10-07 DIAGNOSIS — E119 Type 2 diabetes mellitus without complications: Secondary | ICD-10-CM

## 2013-10-07 MED ORDER — INSULIN GLARGINE 100 UNIT/ML ~~LOC~~ SOLN: SUBCUTANEOUS | Status: DC

## 2013-10-07 NOTE — Telephone Encounter (Signed)
Refilling patient's insulin. Will have patient informed.

## 2013-10-07 NOTE — Telephone Encounter (Signed)
MOther called and needs a refill on his insulin. They forgot to ask on Friday to have this called in and he is out. Blima Rich

## 2013-10-07 NOTE — Telephone Encounter (Signed)
Mother calls again. States that this refill is urgent. Patient been out all weekend. Please refill ASAP.

## 2013-10-07 NOTE — Telephone Encounter (Signed)
Unable to reach patient phone number no longer in service.Paul [name]

## 2013-10-08 NOTE — Assessment & Plan Note (Signed)
Currently not at goal. Will recheck at next visit. Will refill meds for now. May make changes at next visit.

## 2013-10-08 NOTE — Assessment & Plan Note (Signed)
Patient is not checking blood sugar because has not had test strips. He will start to check his blood sugar now. Will follow-up A1C today.

## 2013-10-08 NOTE — Progress Notes (Signed)
   Subjective:    Patient ID: Paul [name], male    DOB: 05/22/1991, 23 y.o.   MRN: [id number]  HPI  Patient presents to clinic for follow-up of diabetes and hypertension. Patient has type 2 diabetes currently treated with metformin and Lantus. He is adherent to his regimen but has not been checking his blood sugars. Reports no episodes of hypoglycemia. Patient also has hypertension and is adherent to treatment with amlodipine and lisinopril-hydrochlorothiazide.    Review of Systems  Cardiovascular: Negative for chest pain and palpitations.  Gastrointestinal: Negative for nausea, vomiting and diarrhea.  Endocrine: Negative for polydipsia and polyuria.  Neurological: Negative for seizures, syncope and headaches.  All other systems reviewed and are negative.      Objective:   Physical Exam  Constitutional: He is oriented to person, place, and time. He appears well-developed and well-nourished.  Cardiovascular: Normal rate, regular rhythm and normal heart sounds.   Pulmonary/Chest: Effort normal and breath sounds normal.  Abdominal: Soft. There is no tenderness.  Neurological: He is alert and oriented to person, place, and time.  Skin: Skin is warm and dry.          Assessment & Plan:

## 2013-10-08 NOTE — Assessment & Plan Note (Signed)
Continue statin therapy.

## 2013-10-29 ENCOUNTER — Other Ambulatory Visit: Payer: Self-pay | Admitting: Family Medicine

## 2013-11-01 NOTE — Telephone Encounter (Signed)
LVM for patient to call back. ?

## 2013-11-11 ENCOUNTER — Other Ambulatory Visit: Payer: Self-pay | Admitting: *Deleted

## 2013-11-12 ENCOUNTER — Telehealth: Payer: Self-pay | Admitting: Family Medicine

## 2013-11-12 NOTE — Telephone Encounter (Signed)
Needs lantus refilled [facility] on [facility]

## 2013-11-13 ENCOUNTER — Telehealth: Payer: Self-pay | Admitting: Family Medicine

## 2013-11-13 NOTE — Telephone Encounter (Signed)
Spoke with patient and informed him that lantus was sent in on 6/11

## 2013-11-13 NOTE — Telephone Encounter (Signed)
Mother says she

## 2013-11-13 NOTE — Telephone Encounter (Signed)
Please advise.thank you. Kindred Reidinger S  

## 2013-11-13 NOTE — Telephone Encounter (Signed)
[phone] Mother wants Dr Teryl Lucy to call her today. She had called about sons insulin. It was called in to [facility] June 11. She now says she got it then but is out of it now. She is tired of his medicine being messed up.

## 2013-11-14 ENCOUNTER — Telehealth: Payer: Self-pay | Admitting: *Deleted

## 2013-11-14 DIAGNOSIS — E1165 Type 2 diabetes mellitus with hyperglycemia: Principal | ICD-10-CM

## 2013-11-14 DIAGNOSIS — IMO0001 Reserved for inherently not codable concepts without codable children: Secondary | ICD-10-CM

## 2013-11-14 MED ORDER — INSULIN GLARGINE 100 UNIT/ML ~~LOC~~ SOLN: SUBCUTANEOUS | Status: DC

## 2013-11-14 NOTE — Telephone Encounter (Signed)
Mom called and stated pt is out of insulin and has been for two days.  Mom stated that medication is not lasting a month.  Mom advised to make an appt to discuss with PCP.  Medication refilled per verbal order Dr. Lindell Noe.  Derl Barrow, RN

## 2013-11-14 NOTE — Telephone Encounter (Signed)
Mom said that she is out of his medicine for his insulin.  Need more than one vial for his insulin

## 2013-11-18 ENCOUNTER — Other Ambulatory Visit: Payer: Self-pay | Admitting: *Deleted

## 2013-11-18 MED ORDER — LISINOPRIL-HYDROCHLOROTHIAZIDE 20-12.5 MG PO TABS: 1.0000 | ORAL_TABLET | Freq: Every day | ORAL | Status: DC

## 2013-11-21 ENCOUNTER — Ambulatory Visit: Payer: Medicaid Other | Admitting: Family Medicine

## 2014-01-30 ENCOUNTER — Telehealth: Payer: Self-pay | Admitting: Family Medicine

## 2014-01-30 DIAGNOSIS — E119 Type 2 diabetes mellitus without complications: Secondary | ICD-10-CM

## 2014-01-30 DIAGNOSIS — I10 Essential (primary) hypertension: Secondary | ICD-10-CM

## 2014-01-30 MED ORDER — AMLODIPINE BESYLATE 10 MG PO TABS: 10.0000 mg | ORAL_TABLET | Freq: Every day | ORAL | Status: DC

## 2014-01-30 MED ORDER — METFORMIN HCL 1000 MG PO TABS: 1000.0000 mg | ORAL_TABLET | Freq: Two times a day (BID) | ORAL | Status: DC

## 2014-01-30 MED ORDER — LISINOPRIL-HYDROCHLOROTHIAZIDE 20-12.5 MG PO TABS: 1.0000 | ORAL_TABLET | Freq: Every day | ORAL | Status: DC

## 2014-01-30 NOTE — Telephone Encounter (Signed)
Refilled metformin, lisinopril-hctz and amlodipine. Patient has an appointment on 9/22 to see me.

## 2014-01-30 NOTE — Telephone Encounter (Signed)
Refill request for Metformin, lisinopril & amlodipine.

## 2014-01-31 NOTE — Telephone Encounter (Signed)
Left message on patient's voicemail.Laurene Melendrez, Lewie Loron

## 2014-02-11 ENCOUNTER — Ambulatory Visit: Payer: Medicaid Other | Admitting: Family Medicine

## 2014-02-14 ENCOUNTER — Other Ambulatory Visit: Payer: Self-pay | Admitting: Family Medicine

## 2014-02-14 NOTE — Telephone Encounter (Signed)
Mother called and she needs refills on her son's insulin and his lisinopril called in. He is out of insulin. Please do this today. jw

## 2014-02-17 ENCOUNTER — Telehealth: Payer: Self-pay | Admitting: Family Medicine

## 2014-02-17 NOTE — Telephone Encounter (Signed)
Pt called and needs his Lantus called in. He is out. Paul [name]

## 2014-02-18 NOTE — Telephone Encounter (Signed)
Medication already refilled. Patient needs to keep up with appointments for management of his chronic issues. I see that he has an appointment with me coming up.

## 2014-02-24 ENCOUNTER — Ambulatory Visit: Payer: Medicaid Other | Admitting: Family Medicine

## 2014-03-07 ENCOUNTER — Other Ambulatory Visit: Payer: Self-pay | Admitting: Family Medicine

## 2014-03-07 DIAGNOSIS — E7800 Pure hypercholesterolemia, unspecified: Secondary | ICD-10-CM

## 2014-03-07 DIAGNOSIS — I10 Essential (primary) hypertension: Secondary | ICD-10-CM

## 2014-03-07 NOTE — Telephone Encounter (Signed)
Mother called and needs a refill for her son's Lipitor and Amlodipine called in to [facility] on [address]. jw

## 2014-03-08 MED ORDER — AMLODIPINE BESYLATE 10 MG PO TABS: 10.0000 mg | ORAL_TABLET | Freq: Every day | ORAL | Status: DC

## 2014-03-08 MED ORDER — ATORVASTATIN CALCIUM 40 MG PO TABS: 40.0000 mg | ORAL_TABLET | Freq: Every day | ORAL | Status: DC

## 2014-03-10 NOTE — Telephone Encounter (Signed)
Attempted to call, but number is invalid to reach patient's mother

## 2014-03-10 NOTE — Telephone Encounter (Signed)
Appointment set for 10/30 with Dr. Lonny Prude

## 2014-03-21 ENCOUNTER — Ambulatory Visit (INDEPENDENT_AMBULATORY_CARE_PROVIDER_SITE_OTHER): Payer: Medicaid Other | Admitting: Family Medicine

## 2014-03-21 VITALS — BP 155/80 | HR 93 | Temp 98.3°F | Ht 74.0 in | Wt 338.0 lb

## 2014-03-21 DIAGNOSIS — E7800 Pure hypercholesterolemia, unspecified: Secondary | ICD-10-CM

## 2014-03-21 DIAGNOSIS — E785 Hyperlipidemia, unspecified: Secondary | ICD-10-CM

## 2014-03-21 DIAGNOSIS — E1165 Type 2 diabetes mellitus with hyperglycemia: Secondary | ICD-10-CM

## 2014-03-21 DIAGNOSIS — I10 Essential (primary) hypertension: Secondary | ICD-10-CM

## 2014-03-21 DIAGNOSIS — IMO0002 Reserved for concepts with insufficient information to code with codable children: Secondary | ICD-10-CM

## 2014-03-21 LAB — POCT GLYCOSYLATED HEMOGLOBIN (HGB A1C): HEMOGLOBIN A1C: 5.9

## 2014-03-21 MED ORDER — GLUCOSE BLOOD VI STRP: ORAL_STRIP | Status: DC

## 2014-03-21 MED ORDER — ACCU-CHEK FASTCLIX LANCETS MISC: 1.0000 | Freq: Three times a day (TID) | Status: DC

## 2014-03-21 MED ORDER — LISINOPRIL-HYDROCHLOROTHIAZIDE 20-12.5 MG PO TABS: 1.0000 | ORAL_TABLET | Freq: Every day | ORAL | Status: DC

## 2014-03-21 MED ORDER — AMLODIPINE BESYLATE 10 MG PO TABS: 10.0000 mg | ORAL_TABLET | Freq: Every day | ORAL | Status: DC

## 2014-03-21 MED ORDER — LEVOTHYROXINE SODIUM 150 MCG PO TABS: 150.0000 ug | ORAL_TABLET | Freq: Every day | ORAL | Status: DC

## 2014-03-21 MED ORDER — ACCU-CHEK NANO SMARTVIEW W/DEVICE KIT: 1.0000 | PACK | Freq: Three times a day (TID) | Status: DC

## 2014-03-21 MED ORDER — GEMFIBROZIL 600 MG PO TABS: 600.0000 mg | ORAL_TABLET | Freq: Two times a day (BID) | ORAL | Status: DC

## 2014-03-21 MED ORDER — INSULIN GLARGINE 100 UNIT/ML ~~LOC~~ SOLN: SUBCUTANEOUS | Status: DC

## 2014-03-21 MED ORDER — ATORVASTATIN CALCIUM 40 MG PO TABS: 40.0000 mg | ORAL_TABLET | Freq: Every day | ORAL | Status: DC

## 2014-03-21 NOTE — Progress Notes (Signed)
    Subjective    Paul [name] is a 23 y.o. male that presents for an office visit.   1. Diabetes: Currently adherent to regimen but has not had his medications for the past few days. No hypoglycemia.  2. Hypertension: Has mostly been adherent to regimen but has not had it consistently for the past 3 days. No headache, chest pain or shortness of breath.  3. CAD: Adherent with regimen. Currently on atorvastatin and gemfibrozil.  History  Substance Use Topics  . Smoking status: Never Smoker   . Smokeless tobacco: Never Used  . Alcohol Use: No    No Known Allergies  No orders of the defined types were placed in this encounter.    ROS  Per HPI   Objective   BP 155/80  Pulse 93  Temp(Src) 98.3 F (36.8 C) (Oral)  Ht 6\' 2"  (1.88 m)  Wt 338 lb (153.316 kg)  BMI 43.38 kg/m2  General: Obese, male in no distress  Assessment and Plan   Please refer to problem based charting of assessment and plan

## 2014-03-21 NOTE — Patient Instructions (Signed)
Thank you for coming to see me today. It was a pleasure. Today we talked about:   Blood pressure: your blood pressure was elevated today. I will recheck in one month and my need to increase your blood pressure medication at that time  Diabetes: I have refilled your medication. Please remember to write down your blood sugars daily.  High cholesterol: I have refilled your medications  Please make an appointment to see me in 1 month for yearly physical.  If you have any questions or concerns, please do not hesitate to call the office at [phone].  Sincerely,  Cordelia Poche, MD

## 2014-03-23 NOTE — Assessment & Plan Note (Signed)
Patient with stable A1C of 5.9. Patient insulin dependent. No hypoglycemia. Patient without meter for a while. Will refill prescription for a meter. Encouraged CBG x4 daily with recordings. Patient motivated to get off of insulin. Will continue curren regimen with plans to hopefully decrease insulin.

## 2014-03-23 NOTE — Assessment & Plan Note (Signed)
Not at goal. Will recheck at next visit and most likely increase Lisinopril-HCTZ. Will get labs at that time. No symptoms

## 2014-03-23 NOTE — Assessment & Plan Note (Signed)
Patient adherent to medication. Continue current regimen.

## 2014-03-24 ENCOUNTER — Telehealth: Payer: Self-pay | Admitting: Family Medicine

## 2014-03-24 DIAGNOSIS — E119 Type 2 diabetes mellitus without complications: Secondary | ICD-10-CM

## 2014-03-24 MED ORDER — METFORMIN HCL 1000 MG PO TABS: 1000.0000 mg | ORAL_TABLET | Freq: Two times a day (BID) | ORAL | Status: DC

## 2014-03-24 NOTE — Telephone Encounter (Signed)
Didn't get refill for Metformin when other meds were sent to pharmacy..  Patient out of this medication completely.  Mom would like to have medication sent to pharmacy today if possible.

## 2014-03-24 NOTE — Telephone Encounter (Signed)
Medication refilled

## 2014-05-07 ENCOUNTER — Ambulatory Visit: Payer: Self-pay

## 2014-05-08 ENCOUNTER — Telehealth: Payer: Self-pay | Admitting: Family Medicine

## 2014-05-08 DIAGNOSIS — E119 Type 2 diabetes mellitus without complications: Secondary | ICD-10-CM

## 2014-05-08 DIAGNOSIS — I10 Essential (primary) hypertension: Secondary | ICD-10-CM

## 2014-05-08 DIAGNOSIS — E7800 Pure hypercholesterolemia, unspecified: Secondary | ICD-10-CM

## 2014-05-08 NOTE — Telephone Encounter (Signed)
Now has the orange card. [facility] Would like to pick them up tomorrow

## 2014-05-08 NOTE — Telephone Encounter (Signed)
Spoke with patient's mother and she stated that patient now needs all of his medications sent to [facility] because he now is established there and can get discounts from there. Wants to know if we can give any samples if we are unable to get this done today

## 2014-05-09 ENCOUNTER — Telehealth: Payer: Self-pay | Admitting: Family Medicine

## 2014-05-09 MED ORDER — GEMFIBROZIL 600 MG PO TABS: 600.0000 mg | ORAL_TABLET | Freq: Two times a day (BID) | ORAL | Status: DC

## 2014-05-09 MED ORDER — ATORVASTATIN CALCIUM 40 MG PO TABS: 40.0000 mg | ORAL_TABLET | Freq: Every day | ORAL | Status: DC

## 2014-05-09 MED ORDER — INSULIN GLARGINE 100 UNIT/ML ~~LOC~~ SOLN: SUBCUTANEOUS | Status: DC

## 2014-05-09 MED ORDER — LISINOPRIL-HYDROCHLOROTHIAZIDE 20-12.5 MG PO TABS: 1.0000 | ORAL_TABLET | Freq: Every day | ORAL | Status: DC

## 2014-05-09 MED ORDER — METFORMIN HCL 1000 MG PO TABS: 1000.0000 mg | ORAL_TABLET | Freq: Two times a day (BID) | ORAL | Status: DC

## 2014-05-09 MED ORDER — AMLODIPINE BESYLATE 10 MG PO TABS: 10.0000 mg | ORAL_TABLET | Freq: Every day | ORAL | Status: DC

## 2014-05-09 MED ORDER — LEVOTHYROXINE SODIUM 150 MCG PO TABS: 150.0000 ug | ORAL_TABLET | Freq: Every day | ORAL | Status: DC

## 2014-05-09 NOTE — Telephone Encounter (Signed)
Refills printed and placed in pile to be faxed to [facility].

## 2014-05-09 NOTE — Telephone Encounter (Signed)
Mother called and was asking for any samples of Lantus for her son. She said they also need the current prescriptions transferred to [facility]. They will come ans ask for [place]. jw

## 2014-05-09 NOTE — Telephone Encounter (Signed)
Mother called again. She wants to get this resolved before the weekend

## 2014-05-12 NOTE — Telephone Encounter (Signed)
Pt's mom was in clinic on Friday 05/09/2014 for samples of lanuts.  Per pt's mom pt is out of Lantus, they will be going to [facility] very soon and just needed enough to last until then.  Per Dr. Valentina Lucks, it was ok to give samples depending on how much Lantus pt is taking.  Pt is currently INJECT 45 UNITS SUBCUTANEOUSLY ONCE DAILY IN THE MORNING AND 60 UNITS AT BEDTIME.  Two vials of Lantus was given.  Lot number: [id number], NDC [id number] exp 01/2016.  Pt to follow with [facility] soon.  Will forward to PCP.  Derl Barrow, RN

## 2014-06-05 ENCOUNTER — Telehealth: Payer: Self-pay | Admitting: Family Medicine

## 2014-06-05 NOTE — Telephone Encounter (Signed)
Wants to know if we have any samples of insulin?

## 2014-06-05 NOTE — Telephone Encounter (Signed)
Spoke with pt and informed him that we do not have any samples at this time. Paul [name], [name] D

## 2014-06-06 ENCOUNTER — Telehealth: Payer: Self-pay | Admitting: *Deleted

## 2014-06-06 NOTE — Telephone Encounter (Signed)
Pt has recently joined [facility].  [facility] has faxed a form for the provider to complete and return back ASAP.  Forms placed in provider box for completion.  Pt is also out of Lantus.  Derl Barrow, RN

## 2014-06-09 NOTE — Telephone Encounter (Signed)
Mom called back to inquire about insulin refill.  -

## 2014-06-10 MED ORDER — INSULIN GLARGINE 100 UNIT/ML ~~LOC~~ SOLN: SUBCUTANEOUS | Status: DC

## 2014-06-10 NOTE — Telephone Encounter (Signed)
Mother called and wanted to know what the status of her son's refill request. Paul [name]

## 2014-06-10 NOTE — Telephone Encounter (Signed)
Left voice message informing pt that refill for Lantus was faxed to [facility].  Derl Barrow, RN

## 2014-06-11 ENCOUNTER — Telehealth: Payer: Self-pay | Admitting: *Deleted

## 2014-06-11 NOTE — Telephone Encounter (Signed)
Dawn from [facility] called to clarify quantity of Lantus.  10 ml will last patient only 9.5 days.  Verbal order given by Dr. Bonner Puna to dispense 20 mL and have pt f/u with PCP for DM management.  Derl Barrow, RN

## 2014-06-30 ENCOUNTER — Encounter: Payer: Self-pay | Admitting: Family Medicine

## 2014-06-30 ENCOUNTER — Ambulatory Visit (INDEPENDENT_AMBULATORY_CARE_PROVIDER_SITE_OTHER): Payer: Self-pay | Admitting: Family Medicine

## 2014-06-30 VITALS — BP 130/70 | HR 87 | Temp 98.6°F | Ht 74.0 in | Wt 363.0 lb

## 2014-06-30 DIAGNOSIS — E039 Hypothyroidism, unspecified: Secondary | ICD-10-CM

## 2014-06-30 DIAGNOSIS — IMO0002 Reserved for concepts with insufficient information to code with codable children: Secondary | ICD-10-CM

## 2014-06-30 DIAGNOSIS — E7800 Pure hypercholesterolemia, unspecified: Secondary | ICD-10-CM

## 2014-06-30 DIAGNOSIS — I10 Essential (primary) hypertension: Secondary | ICD-10-CM

## 2014-06-30 DIAGNOSIS — E785 Hyperlipidemia, unspecified: Secondary | ICD-10-CM

## 2014-06-30 DIAGNOSIS — E1165 Type 2 diabetes mellitus with hyperglycemia: Secondary | ICD-10-CM

## 2014-06-30 DIAGNOSIS — E119 Type 2 diabetes mellitus without complications: Secondary | ICD-10-CM

## 2014-06-30 LAB — POCT GLYCOSYLATED HEMOGLOBIN (HGB A1C): Hemoglobin A1C: 6.3

## 2014-06-30 MED ORDER — INSULIN GLARGINE 100 UNIT/ML ~~LOC~~ SOLN: 50.0000 [IU] | Freq: Two times a day (BID) | SUBCUTANEOUS | Status: DC

## 2014-06-30 MED ORDER — LISINOPRIL-HYDROCHLOROTHIAZIDE 20-12.5 MG PO TABS: 1.0000 | ORAL_TABLET | Freq: Every day | ORAL | Status: DC

## 2014-06-30 MED ORDER — AMLODIPINE BESYLATE 10 MG PO TABS: 10.0000 mg | ORAL_TABLET | Freq: Every day | ORAL | Status: DC

## 2014-06-30 MED ORDER — METFORMIN HCL 1000 MG PO TABS: 1000.0000 mg | ORAL_TABLET | Freq: Two times a day (BID) | ORAL | Status: DC

## 2014-06-30 MED ORDER — GEMFIBROZIL 600 MG PO TABS: 600.0000 mg | ORAL_TABLET | Freq: Two times a day (BID) | ORAL | Status: DC

## 2014-06-30 MED ORDER — ATORVASTATIN CALCIUM 40 MG PO TABS: 40.0000 mg | ORAL_TABLET | Freq: Every day | ORAL | Status: DC

## 2014-06-30 MED ORDER — LEVOTHYROXINE SODIUM 150 MCG PO TABS: 150.0000 ug | ORAL_TABLET | Freq: Every day | ORAL | Status: DC

## 2014-06-30 NOTE — Assessment & Plan Note (Signed)
BP initially elevated, but normal on repeat.  No changes to regimen  Refill amlodipine 10mg  qD  Refill lisinopril-hctz 20-12.5mg  BID

## 2014-06-30 NOTE — Progress Notes (Signed)
    Subjective    Paul [name] is a 24 y.o. male that presents for an office visit.   1. Diabetes: He has been giving himself insulin without checking blood sugars. He has not checked his blood sugar in over three months due to not having a meter (and not being able to afford one). He is taking 50u lantus in the morning and 60u at night and Metformin  2. Hypertension: He is adherent with amlodipine and lisinopril-hctz. He has gained 25 lbs since three months ago. He states he has not been as active. No headaches, chest pain, or shortness of breath.   3. Hypothyroidism: adherent with synthroid. Last TSH checked in 2013. No side effects.  History  Substance Use Topics  . Smoking status: Never Smoker   . Smokeless tobacco: Never Used  . Alcohol Use: No    No Known Allergies  No orders of the defined types were placed in this encounter.    ROS Per HPI   Objective   BP 130/70 mmHg  Pulse 87  Temp(Src) 98.6 F (37 C) (Oral)  Ht 6\' 2"  (1.88 m)  Wt 363 lb (164.656 kg)  BMI 46.59 kg/m2  General: Well appearing, morbidly obese male HEENT: No enlarged thyroid or thyroid nodule felt  Assessment and Plan   Please refer to problem based charting of assessment and plan

## 2014-06-30 NOTE — Assessment & Plan Note (Addendum)
Patient not checking his blood sugar. Encouraged patient to obtain his meter from the [facility]. Patient has gained weight (25lbs), which has probably added to the fact that his A1C is up this time to 6.3 from 5.9, however, I still believe he is ready to be weaned down on his lantus. Patient refuses short acting insulin because of multiple sticks.  Decreased lantus by about 10% to Lantus 50u BID  Refill metformin  Encouraged/reminded to keep a CBG log and to bring it during visits

## 2014-06-30 NOTE — Patient Instructions (Signed)
Thank you for coming to see me today. It was a pleasure. Today we talked about:   Diabetes: Please make sure to check your blood sugar at home 4 times per day when you get your meter. Please write down these values and bring them to your next visit  Hypertension: Your blood pressure was normal today. No changes to your blood pressure medication  Hypothyroidism: I will recheck your TSH. You can do this with your fasting labs whenever you have time, but before your next visit.  Please make an appointment to see me in 1 month for yearly physical.  If you have any questions or concerns, please do not hesitate to call the office at [phone].  Sincerely,  Cordelia Poche, MD

## 2014-06-30 NOTE — Assessment & Plan Note (Signed)
TSH has not been checked in a while. Unsure of why he has hypothyroidism. Possible he may not actually have this anymore. From what I can tell, patient has never had abnormal TSH.  Continue synthroid for now  Recheck TSH

## 2014-07-03 ENCOUNTER — Other Ambulatory Visit: Payer: Self-pay | Admitting: Family Medicine

## 2014-07-03 ENCOUNTER — Telehealth: Payer: Self-pay | Admitting: *Deleted

## 2014-07-03 MED ORDER — OLMESARTAN MEDOXOMIL-HCTZ 20-12.5 MG PO TABS: 1.0000 | ORAL_TABLET | Freq: Every day | ORAL | Status: DC

## 2014-07-03 MED ORDER — ROSUVASTATIN CALCIUM 20 MG PO TABS: 20.0000 mg | ORAL_TABLET | Freq: Every day | ORAL | Status: DC

## 2014-07-03 NOTE — Telephone Encounter (Signed)
Received phone call from [facility] @ [facility].  She received old labs from our office on pt.  His creatinine was 1.63 from 11/01/12 and metformin is contraindicated in pt with creatinine 1.5 or higher.   Crystal reports calling pt and telling him not to take the metformin (although pt states he has been taking the metformin previously).  She advised she would call him back after speaking with MD.  Dr. Lonny Prude paged x 2 with no response.  Per Dr. Mingo Amber (preceptor) ok to stop metformin until new labs are drawn.  Crystal will call pt to reiterate importance of having labs drawn and will also discontinue metformin refills until labs are drawn and sent to her.  Fleeger, Salome Spotted

## 2014-09-08 ENCOUNTER — Other Ambulatory Visit: Payer: Self-pay | Admitting: *Deleted

## 2014-09-08 DIAGNOSIS — I10 Essential (primary) hypertension: Secondary | ICD-10-CM

## 2014-09-08 DIAGNOSIS — IMO0002 Reserved for concepts with insufficient information to code with codable children: Secondary | ICD-10-CM

## 2014-09-08 DIAGNOSIS — E1165 Type 2 diabetes mellitus with hyperglycemia: Secondary | ICD-10-CM

## 2014-09-08 NOTE — Telephone Encounter (Signed)
Prescription changed to [facility] due to formulary issue. Patient should have adequate refills for that medication.

## 2014-09-10 ENCOUNTER — Other Ambulatory Visit: Payer: Self-pay | Admitting: Family Medicine

## 2014-09-10 DIAGNOSIS — I10 Essential (primary) hypertension: Secondary | ICD-10-CM

## 2014-09-10 MED ORDER — AMLODIPINE BESYLATE 10 MG PO TABS: 10.0000 mg | ORAL_TABLET | Freq: Every day | ORAL | Status: DC

## 2014-09-10 NOTE — Telephone Encounter (Signed)
Mother called and son needs a refill on his Lisinopril and Norvasc. jw

## 2014-09-15 ENCOUNTER — Telehealth: Payer: Self-pay | Admitting: Family Medicine

## 2014-09-15 ENCOUNTER — Other Ambulatory Visit: Payer: Self-pay | Admitting: *Deleted

## 2014-09-15 DIAGNOSIS — E1165 Type 2 diabetes mellitus with hyperglycemia: Secondary | ICD-10-CM

## 2014-09-15 DIAGNOSIS — IMO0002 Reserved for concepts with insufficient information to code with codable children: Secondary | ICD-10-CM

## 2014-09-15 DIAGNOSIS — I10 Essential (primary) hypertension: Secondary | ICD-10-CM

## 2014-09-15 NOTE — Telephone Encounter (Signed)
Would like to ask the PCP a question about his medicine / thanks [employer], ASA

## 2014-09-15 NOTE — Telephone Encounter (Signed)
Received a fax from [facility] stating pt is out of Lisinopril-HCTZ 20-12.5 mg.  Pt has not received his PAP supply of Benicar-HCTZ.  Can pt get a refill until Benicar-HCTZ [facility]?  Derl Barrow, RN

## 2014-09-15 NOTE — Telephone Encounter (Signed)
Lisinopril question. Patient states that he was told not he is no loner taking lisinopril. Received this morning from [facility] Received a fax from [facility] stating pt is out of Lisinopril-HCTZ 20-12.5 mg. Pt has not received his PAP supply of Benicar-HCTZ. Can pt get a refill until Benicar-HCTZ is delivered to the [facility]?

## 2014-09-16 MED ORDER — LISINOPRIL-HYDROCHLOROTHIAZIDE 20-12.5 MG PO TABS: 1.0000 | ORAL_TABLET | Freq: Every day | ORAL | Status: DC

## 2014-09-17 NOTE — Telephone Encounter (Signed)
Spoke with pt and informed him of below. Katharina Caper, Colden Samaras D

## 2014-09-17 NOTE — Telephone Encounter (Signed)
Lisinopril-HCTZ sent refilled.

## 2014-10-03 ENCOUNTER — Ambulatory Visit: Payer: Medicaid Other | Admitting: Family Medicine

## 2014-11-06 ENCOUNTER — Ambulatory Visit: Payer: Medicaid Other | Admitting: Family Medicine

## 2014-11-25 ENCOUNTER — Ambulatory Visit: Payer: Medicaid Other | Admitting: Family Medicine

## 2014-11-28 ENCOUNTER — Ambulatory Visit (INDEPENDENT_AMBULATORY_CARE_PROVIDER_SITE_OTHER): Payer: Self-pay | Admitting: Family Medicine

## 2014-11-28 ENCOUNTER — Encounter: Payer: Self-pay | Admitting: Family Medicine

## 2014-11-28 VITALS — BP 150/82 | HR 116 | Temp 97.7°F | Wt 380.0 lb

## 2014-11-28 DIAGNOSIS — I10 Essential (primary) hypertension: Secondary | ICD-10-CM

## 2014-11-28 DIAGNOSIS — E1165 Type 2 diabetes mellitus with hyperglycemia: Secondary | ICD-10-CM

## 2014-11-28 DIAGNOSIS — E039 Hypothyroidism, unspecified: Secondary | ICD-10-CM

## 2014-11-28 DIAGNOSIS — IMO0002 Reserved for concepts with insufficient information to code with codable children: Secondary | ICD-10-CM

## 2014-11-28 DIAGNOSIS — E785 Hyperlipidemia, unspecified: Secondary | ICD-10-CM

## 2014-11-28 LAB — CBC WITH DIFFERENTIAL/PLATELET
BASOS ABS: 0 10*3/uL (ref 0.0–0.1)
Basophils Relative: 0 % (ref 0–1)
Eosinophils Absolute: 0.2 10*3/uL (ref 0.0–0.7)
Eosinophils Relative: 2 % (ref 0–5)
HCT: 39.6 % (ref 39.0–52.0)
Hemoglobin: 13.4 g/dL (ref 13.0–17.0)
LYMPHS ABS: 3.3 10*3/uL (ref 0.7–4.0)
LYMPHS PCT: 29 % (ref 12–46)
MCH: 27.1 pg (ref 26.0–34.0)
MCHC: 33.8 g/dL (ref 30.0–36.0)
MCV: 80 fL (ref 78.0–100.0)
MPV: 11.3 fL (ref 8.6–12.4)
Monocytes Absolute: 0.6 10*3/uL (ref 0.1–1.0)
Monocytes Relative: 5 % (ref 3–12)
NEUTROS ABS: 7.4 10*3/uL (ref 1.7–7.7)
NEUTROS PCT: 64 % (ref 43–77)
PLATELETS: 263 10*3/uL (ref 150–400)
RBC: 4.95 MIL/uL (ref 4.22–5.81)
RDW: 14.6 % (ref 11.5–15.5)
WBC: 11.5 10*3/uL — ABNORMAL HIGH (ref 4.0–10.5)

## 2014-11-28 LAB — POCT GLYCOSYLATED HEMOGLOBIN (HGB A1C): HEMOGLOBIN A1C: 9.6

## 2014-11-28 LAB — TSH: TSH: 2.303 u[IU]/mL (ref 0.350–4.500)

## 2014-11-28 MED ORDER — AMLODIPINE BESYLATE 10 MG PO TABS: 10.0000 mg | ORAL_TABLET | Freq: Every day | ORAL | Status: DC

## 2014-11-28 MED ORDER — LEVOTHYROXINE SODIUM 150 MCG PO TABS: 150.0000 ug | ORAL_TABLET | Freq: Every day | ORAL | Status: DC

## 2014-11-28 MED ORDER — INSULIN GLARGINE 100 UNIT/ML ~~LOC~~ SOLN: 50.0000 [IU] | Freq: Two times a day (BID) | SUBCUTANEOUS | Status: DC

## 2014-11-28 MED ORDER — LISINOPRIL-HYDROCHLOROTHIAZIDE 20-12.5 MG PO TABS: 1.0000 | ORAL_TABLET | Freq: Every day | ORAL | Status: DC

## 2014-11-28 MED ORDER — ROSUVASTATIN CALCIUM 20 MG PO TABS: 20.0000 mg | ORAL_TABLET | Freq: Every day | ORAL | Status: DC

## 2014-11-28 NOTE — Patient Instructions (Signed)
Thank you for coming to see me today. It was a pleasure. Today we talked about:   Diabetes: Your A1C is over 9 now from 6.3. We will work on diet for now but may need to adjust your medications  Hypertension: Your blood pressure is up. I am rechecking before you go but if it is still high, I would like to recheck in two weeks (nurse visit) to see fi we need to adjust medications  I am checking lab work and you will get a letter or call  Please make an appointment to see me in 4 weeks for follow-up.  If you have any questions or concerns, please do not hesitate to call the office at [phone].  Sincerely,  Cordelia Poche, MD

## 2014-11-28 NOTE — Progress Notes (Signed)
    Subjective    Paul [name] is a 24 y.o. male that presents for a follow-up visit for chronic issues.   1. Diabetes mellitus: He has been adherent with Lantus 50u twice daily. He has discontinued metformin. He has gained 17 pounds since the last visit. Fasting blood sugars around 130s. No hypoglycemia.  2. Hypertension: Is adherent with lisinopril-hctz and amlodipine. No chest pain or shortness of breath.  3. Weight gain: Has been having some issues with coping with depressed mood especially after he and his girlfriend broke up. 4. Hyperlipidemia: adherent with Crestor. No side effects  History  Substance Use Topics  . Smoking status: Never Smoker   . Smokeless tobacco: Never Used  . Alcohol Use: No    No Known Allergies  No orders of the defined types were placed in this encounter.    ROS  Per HPI   Objective   BP 150/82 mmHg  Pulse 116  Temp(Src) 97.7 F (36.5 C) (Oral)  Wt 380 lb (172.367 kg)  General: Well appearing, morbidly obese, no distress  Assessment and Plan   Please refer to problem based charting of assessment and plan

## 2014-11-29 ENCOUNTER — Telehealth: Payer: Self-pay | Admitting: Obstetrics and Gynecology

## 2014-11-29 DIAGNOSIS — E875 Hyperkalemia: Secondary | ICD-10-CM

## 2014-11-29 LAB — COMPLETE METABOLIC PANEL WITH GFR
ALT: 20 U/L (ref 0–53)
AST: 21 U/L (ref 0–37)
Albumin: 4.2 g/dL (ref 3.5–5.2)
Alkaline Phosphatase: 84 U/L (ref 39–117)
BUN: 31 mg/dL — AB (ref 6–23)
CALCIUM: 10 mg/dL (ref 8.4–10.5)
CO2: 19 meq/L (ref 19–32)
CREATININE: 1.85 mg/dL — AB (ref 0.50–1.35)
Chloride: 104 mEq/L (ref 96–112)
GFR, EST AFRICAN AMERICAN: 58 mL/min — AB
GFR, EST NON AFRICAN AMERICAN: 51 mL/min — AB
Glucose, Bld: 187 mg/dL — ABNORMAL HIGH (ref 70–99)
Potassium: 6.5 mEq/L (ref 3.5–5.3)
Sodium: 134 mEq/L — ABNORMAL LOW (ref 135–145)
Total Bilirubin: 0.3 mg/dL (ref 0.2–1.2)
Total Protein: 8.1 g/dL (ref 6.0–8.3)

## 2014-11-29 LAB — LDL CHOLESTEROL, DIRECT: Direct LDL: 112 mg/dL — ABNORMAL HIGH

## 2014-11-29 NOTE — Telephone Encounter (Signed)
Family Medicine Emergency Line Telephone Note   Received page from [facility] lab about critical value on patient. K was 6.5 (baseline appears to be 4). No signs of hemolysis on labs. Appears to have AKI as well with bump in BUN/Cr. Tried calling patient but no answer. LVM stating that patient had this critical value and that he will need to come into clinic for lab redraw early next week. Asked patient to stop his lisinopril as this can cause hyperkalemia. Patient told to go to ED if he was symptomatic for further evaluation. Also advised patient that he can call the after hours line if has further questions.  Will forward to PCP.   Luiz Blare, DO 11/29/2014, 8:52 AM PGY-2, [place]

## 2014-11-30 NOTE — Assessment & Plan Note (Addendum)
BP continues to not be at goal. Patient states he is adherent with regimen. No red flags however with patient's weight, diabetes and hyperlipidemia, his risk for CAD is very high. Discussed adding to regimen but patient is resistant to that plan. Will have patient come back in two weeks for nurses visit and recheck of BP for probably augmentation in regimen if continues to remain elevated.

## 2014-11-30 NOTE — Assessment & Plan Note (Signed)
Recheck TSH today.  

## 2014-11-30 NOTE — Assessment & Plan Note (Addendum)
Patient currently Adherent with regimen however diet seems to have worsened. Dicussed results of A1C with him. Discussed that I would like to start Novolog during the day with meals. Patient very resistant to starting Novolog and is bargaining with adding Metformin back instead. Discussed this as a possibility but will check his renal function first. Discussed diet modification as well as he is eating more "Junk food" and drinking more sodas. Will refer to opthalmology.

## 2014-12-01 ENCOUNTER — Telehealth: Payer: Self-pay | Admitting: Family Medicine

## 2014-12-07 ENCOUNTER — Encounter: Payer: Self-pay | Admitting: Family Medicine

## 2014-12-09 NOTE — Telephone Encounter (Signed)
No message after multiple attempts to contact and no reply form patient. Letter sent via certified mail regarding critical lab values. Patient needs to update contact information.

## 2015-12-15 ENCOUNTER — Other Ambulatory Visit: Payer: Self-pay | Admitting: *Deleted

## 2015-12-15 MED ORDER — INSULIN GLARGINE 100 UNIT/ML ~~LOC~~ SOLN: 50.0000 [IU] | Freq: Two times a day (BID) | SUBCUTANEOUS | 0 refills | Status: DC

## 2015-12-15 NOTE — Telephone Encounter (Signed)
Will RF Lantus x1.  Needs office visit.  Has not been seen in >1 year.

## 2016-01-01 ENCOUNTER — Other Ambulatory Visit: Payer: Self-pay | Admitting: *Deleted

## 2016-01-01 DIAGNOSIS — E039 Hypothyroidism, unspecified: Secondary | ICD-10-CM

## 2016-01-01 MED ORDER — LEVOTHYROXINE SODIUM 150 MCG PO TABS: 150.0000 ug | ORAL_TABLET | Freq: Every day | ORAL | 0 refills | Status: DC

## 2016-01-01 NOTE — Telephone Encounter (Signed)
Patient has not had TSH since 11/2014.  Rx sent in for 2 month supply.  Please have schedule an appointment for annual exam with fasting labs.

## 2016-01-04 ENCOUNTER — Other Ambulatory Visit: Payer: Self-pay | Admitting: Family Medicine

## 2016-01-04 MED ORDER — INSULIN GLARGINE 100 UNIT/ML ~~LOC~~ SOLN: 50.0000 [IU] | Freq: Two times a day (BID) | SUBCUTANEOUS | 1 refills | Status: DC

## 2016-01-04 NOTE — Telephone Encounter (Signed)
Yes, this has been sent in as well.

## 2016-01-04 NOTE — Telephone Encounter (Signed)
Spoke to pt. Waiting on insurance to kick in. Will call and schedule and appt for annual exam. Also, pt asking if Lantus can be refilled for a couple of months until he can get in here. Please advise.  Paul [name], CMA

## 2016-01-06 NOTE — Telephone Encounter (Signed)
LVM for pt to call. If pt calls, please let him know Lantus was called in for him. Ottis Stain, CMA

## 2016-04-15 ENCOUNTER — Encounter (HOSPITAL_COMMUNITY): Payer: Self-pay

## 2016-04-15 ENCOUNTER — Ambulatory Visit (HOSPITAL_COMMUNITY)
Admission: EM | Admit: 2016-04-15 | Discharge: 2016-04-15 | Disposition: A | Discharge status: Home / Self Care | Payer: Medicaid Other | Attending: Emergency Medicine | Admitting: Emergency Medicine

## 2016-04-15 DIAGNOSIS — M545 Low back pain, unspecified: Secondary | ICD-10-CM

## 2016-04-15 DIAGNOSIS — R310 Gross hematuria: Secondary | ICD-10-CM

## 2016-04-15 DIAGNOSIS — Z794 Long term (current) use of insulin: Secondary | ICD-10-CM

## 2016-04-15 DIAGNOSIS — E1129 Type 2 diabetes mellitus with other diabetic kidney complication: Secondary | ICD-10-CM

## 2016-04-15 LAB — POCT URINALYSIS DIP (DEVICE)
BILIRUBIN URINE: NEGATIVE
GLUCOSE, UA: 100 mg/dL — AB
KETONES UR: NEGATIVE mg/dL
Leukocytes, UA: NEGATIVE
Nitrite: POSITIVE — AB
PH: 6 (ref 5.0–8.0)
SPECIFIC GRAVITY, URINE: 1.02 (ref 1.005–1.030)
Urobilinogen, UA: 1 mg/dL (ref 0.0–1.0)

## 2016-04-15 MED ORDER — CEPHALEXIN 500 MG PO CAPS: 500.0000 mg | ORAL_CAPSULE | Freq: Four times a day (QID) | ORAL | 0 refills | Status: DC

## 2016-04-15 NOTE — ED Provider Notes (Signed)
CSN: [id number]     Arrival date & time 04/15/16  1041 History   None    Chief Complaint  Patient presents with  . Flank Pain   (Consider location/radiation/quality/duration/timing/severity/associated sxs/prior Treatment) Morbidly obese 25 year old male with type 2 diabetes mellitus treated with insulin states that he has been feeling tired for one week. Is also complaining of pain across the lower back, gross hematuria and the sensation that he may have a UTI. He states he is not checking his blood sugars regularly.      Past Medical History:  Diagnosis Date  . Diabetes mellitus   . Hypertension   . Thyroid disease    History reviewed. No pertinent surgical history. Family History  Problem Relation Age of Onset  . Heart disease Mother   . Hypertension Mother    Social History  Substance Use Topics  . Smoking status: Never Smoker  . Smokeless tobacco: Never Used  . Alcohol use No    Review of Systems  Constitutional: Positive for activity change and fatigue. Negative for fever.  HENT: Negative.   Respiratory: Negative.   Gastrointestinal: Negative.   Endocrine: Negative for polydipsia.  Genitourinary: Positive for hematuria. Negative for discharge, dysuria, genital sores, scrotal swelling and testicular pain.       Small volume voids  Musculoskeletal: Negative.   Neurological: Negative.   All other systems reviewed and are negative.   Allergies  Patient has no known allergies.  Home Medications   Prior to Admission medications   Medication Sig Start Date End Date Taking? Authorizing Provider  ACCU-CHEK FASTCLIX LANCETS MISC 1 each by Does not apply route 4 (four) times daily -  before meals and at bedtime. 03/21/14  Yes Mariel Aloe, MD  amLODipine (NORVASC) 10 MG tablet Take 1 tablet (10 mg total) by mouth daily. 11/28/14  Yes Mariel Aloe, MD  Blood Glucose Monitoring Suppl (ACCU-CHEK NANO SMARTVIEW) W/DEVICE KIT 1 Device by Does not apply route 4 (four)  times daily -  before meals and at bedtime. 03/21/14  Yes Mariel Aloe, MD  glucose blood test strip Use as instructed 03/21/14  Yes Mariel Aloe, MD  insulin glargine (LANTUS) 100 UNIT/ML injection Inject 0.5 mLs (50 Units total) into the skin 2 (two) times daily. 01/04/16  Yes Ashly Windell Moulding, DO  levothyroxine (SYNTHROID, LEVOTHROID) 150 MCG tablet Take 1 tablet (150 mcg total) by mouth daily. Needs office visit 01/01/16  Yes Ashly Windell Moulding, DO  metFORMIN (GLUCOPHAGE) 1000 MG tablet Take 1 tablet (1,000 mg total) by mouth 2 (two) times daily with a meal. 06/30/14  Yes Mariel Aloe, MD  rosuvastatin (CRESTOR) 20 MG tablet Take 1 tablet (20 mg total) by mouth daily. 11/28/14  Yes Mariel Aloe, MD  cephALEXin (KEFLEX) 500 MG capsule Take 1 capsule (500 mg total) by mouth 4 (four) times daily. 04/15/16   Janne Napoleon, NP  lisinopril-hydrochlorothiazide (PRINZIDE,ZESTORETIC) 20-12.5 MG per tablet Take 1 tablet by mouth daily. 11/28/14   Mariel Aloe, MD   Meds Ordered and Administered this Visit  Medications - No data to display  BP 148/92 (BP Location: Left Arm)   Pulse 100   Temp 97.9 F (36.6 C) (Oral)   Resp 16   SpO2 99%  No data found.   Physical Exam  Constitutional: He is oriented to person, place, and time. He appears well-nourished. No distress.  Morbidly and moderately severe obesity  HENT:  Head: Normocephalic and atraumatic.  Eyes: EOM  are normal.  Neck: Neck supple.  Cardiovascular: Normal rate.   Pulmonary/Chest: Effort normal.  Neurological: He is alert and oriented to person, place, and time.  Skin: Skin is warm and dry.  Psychiatric: He has a normal mood and affect.  Nursing note and vitals reviewed.   Urgent Care Course   Clinical Course     Procedures (including critical care time)  Labs Review Labs Reviewed  POCT URINALYSIS DIP (DEVICE) - Abnormal; Notable for the following:       Result Value   Glucose, UA 100 (*)    Hgb urine dipstick  MODERATE (*)    Protein, ur >=300 (*)    Nitrite POSITIVE (*)    All other components within normal limits  URINE CULTURE  URINE CYTOLOGY ANCILLARY ONLY    Imaging Review No results found.   Visual Acuity Review  Right Eye Distance:   Left Eye Distance:   Bilateral Distance:    Right Eye Near:   Left Eye Near:    Bilateral Near:         MDM   1. Gross hematuria   2. Acute bilateral low back pain without sciatica   3. Type 2 diabetes mellitus with other diabetic kidney complication, with long-term current use of insulin ([place])    Take your medication as directed. Be sure to check your blood sugars at least twice a day. Drink plenty of fluids and stay well-hydrated. Follow-up with your primary care doctor regularly and would recommend a visit in approximately 7-10 days. Sooner if feeling worse or not getting better. Read instructions regarding urinary tract infection. Meds ordered this encounter  Medications  . cephALEXin (KEFLEX) 500 MG capsule    Sig: Take 1 capsule (500 mg total) by mouth 4 (four) times daily.    Dispense:  28 capsule    Refill:  0    Order Specific Question:   Supervising Provider    Answer:   Melony Overly [[id number]]       Janne Napoleon, NP 04/15/16 1252

## 2016-04-15 NOTE — ED Triage Notes (Signed)
Pt said he has been having flank pain for 1 week, hematuria, no dysuria and fatigue. Said he did take some AZO and did get some relief.

## 2016-04-15 NOTE — Discharge Instructions (Signed)
Take your medication as directed. Be sure to check your blood sugars at least twice a day. Drink plenty of fluids and stay well-hydrated. Follow-up with your primary care doctor regularly and would recommend a visit in approximately 7-10 days. Sooner if feeling worse or not getting better. Read instructions regarding urinary tract infection.

## 2016-04-15 NOTE — ED Notes (Signed)
Dirty and clean catch urine specimens in lab

## 2016-04-21 ENCOUNTER — Ambulatory Visit (HOSPITAL_COMMUNITY)
Admission: EM | Admit: 2016-04-21 | Discharge: 2016-04-21 | Disposition: A | Discharge status: Home / Self Care | Payer: Medicaid Other | Attending: Family Medicine | Admitting: Family Medicine

## 2016-04-21 ENCOUNTER — Encounter (HOSPITAL_COMMUNITY): Payer: Self-pay | Admitting: Emergency Medicine

## 2016-04-21 DIAGNOSIS — Z79899 Other long term (current) drug therapy: Secondary | ICD-10-CM | POA: Insufficient documentation

## 2016-04-21 DIAGNOSIS — R319 Hematuria, unspecified: Secondary | ICD-10-CM

## 2016-04-21 DIAGNOSIS — Z794 Long term (current) use of insulin: Secondary | ICD-10-CM | POA: Insufficient documentation

## 2016-04-21 DIAGNOSIS — N390 Urinary tract infection, site not specified: Secondary | ICD-10-CM | POA: Insufficient documentation

## 2016-04-21 LAB — POCT URINALYSIS DIP (DEVICE)
Bilirubin Urine: NEGATIVE
GLUCOSE, UA: NEGATIVE mg/dL
Ketones, ur: NEGATIVE mg/dL
NITRITE: NEGATIVE
PH: 6 (ref 5.0–8.0)
Specific Gravity, Urine: 1.02 (ref 1.005–1.030)
UROBILINOGEN UA: 0.2 mg/dL (ref 0.0–1.0)

## 2016-04-21 MED ORDER — CIPROFLOXACIN HCL 500 MG PO TABS: 500.0000 mg | ORAL_TABLET | Freq: Two times a day (BID) | ORAL | 0 refills | Status: DC

## 2016-04-21 NOTE — ED Triage Notes (Signed)
Patient seen 11/24 for uti.  Reports the antibiotics are not helping like he thinks it should.  Still having penile irritation,  Patient continues to have back pain.

## 2016-04-21 NOTE — ED Provider Notes (Signed)
CSN: [id number]     Arrival date & time 04/21/16  1846 History   None    No chief complaint on file.  (Consider location/radiation/quality/duration/timing/severity/associated sxs/prior Treatment) The history is provided by the patient. No language interpreter was used.  Dysuria  This is a recurrent problem. The current episode started more than 1 week ago. The problem occurs constantly. The problem has not changed since onset.Nothing aggravates the symptoms. Nothing relieves the symptoms. The treatment provided no relief.  Pt has been on antibiotics for 5 days with no relief.  He reports he sees decreased blood but still has burning.  Past Medical History:  Diagnosis Date  . Diabetes mellitus   . Hypertension   . Thyroid disease    History reviewed. No pertinent surgical history. Family History  Problem Relation Age of Onset  . Heart disease Mother   . Hypertension Mother    Social History  Substance Use Topics  . Smoking status: Never Smoker  . Smokeless tobacco: Never Used  . Alcohol use No    Review of Systems  Genitourinary: Positive for dysuria.  All other systems reviewed and are negative.   Allergies  Patient has no known allergies.  Home Medications   Prior to Admission medications   Medication Sig Start Date End Date Taking? Authorizing Provider  ACCU-CHEK FASTCLIX LANCETS MISC 1 each by Does not apply route 4 (four) times daily -  before meals and at bedtime. 03/21/14   Mariel Aloe, MD  amLODipine (NORVASC) 10 MG tablet Take 1 tablet (10 mg total) by mouth daily. 11/28/14   Mariel Aloe, MD  Blood Glucose Monitoring Suppl (ACCU-CHEK NANO SMARTVIEW) W/DEVICE KIT 1 Device by Does not apply route 4 (four) times daily -  before meals and at bedtime. 03/21/14   Mariel Aloe, MD  cephALEXin (KEFLEX) 500 MG capsule Take 1 capsule (500 mg total) by mouth 4 (four) times daily. 04/15/16   Janne Napoleon, NP  ciprofloxacin (CIPRO) 500 MG tablet Take 1 tablet (500 mg  total) by mouth every 12 (twelve) hours. 04/21/16   Fransico Meadow, PA-C  glucose blood test strip Use as instructed 03/21/14   Mariel Aloe, MD  insulin glargine (LANTUS) 100 UNIT/ML injection Inject 0.5 mLs (50 Units total) into the skin 2 (two) times daily. 01/04/16   Ashly Windell Moulding, DO  levothyroxine (SYNTHROID, LEVOTHROID) 150 MCG tablet Take 1 tablet (150 mcg total) by mouth daily. Needs office visit 01/01/16   Janora Norlander, DO  lisinopril-hydrochlorothiazide (PRINZIDE,ZESTORETIC) 20-12.5 MG per tablet Take 1 tablet by mouth daily. Patient not taking: Reported on 04/21/2016 11/28/14   Mariel Aloe, MD  metFORMIN (GLUCOPHAGE) 1000 MG tablet Take 1 tablet (1,000 mg total) by mouth 2 (two) times daily with a meal. Patient not taking: Reported on 04/21/2016 06/30/14   Mariel Aloe, MD  rosuvastatin (CRESTOR) 20 MG tablet Take 1 tablet (20 mg total) by mouth daily. 11/28/14   Mariel Aloe, MD   Meds Ordered and Administered this Visit  Medications - No data to display  BP 153/91 (BP Location: Left Arm) Comment (BP Location): regular cuff on forearm  Pulse 101   Temp 98.6 F (37 C) (Oral)   Resp 24   SpO2 98%  No data found.   Physical Exam  Constitutional: He is oriented to person, place, and time. He appears well-developed and well-nourished.  HENT:  Head: Normocephalic.  Eyes: EOM are normal.  Neck: Normal range of motion.  Pulmonary/Chest: Effort normal.  Abdominal: He exhibits no distension.  Musculoskeletal: Normal range of motion.  Neurological: He is alert and oriented to person, place, and time.  Psychiatric: He has a normal mood and affect.  Nursing note and vitals reviewed.   Urgent Care Course   Clinical Course     Procedures (including critical care time)  Labs Review Labs Reviewed  POCT URINALYSIS DIP (DEVICE) - Abnormal; Notable for the following:       Result Value   Hgb urine dipstick MODERATE (*)    Protein, ur >=300 (*)    Leukocytes, UA  TRACE (*)    All other components within normal limits  URINE CULTURE    Imaging Review No results found.   Visual Acuity Review  Right Eye Distance:   Left Eye Distance:   Bilateral Distance:    Right Eye Near:   Left Eye Near:    Bilateral Near:         MDM   1. Urinary tract infection with hematuria, site unspecified    Meds ordered this encounter  Medications  . ciprofloxacin (CIPRO) 500 MG tablet    Sig: Take 1 tablet (500 mg total) by mouth every 12 (twelve) hours.    Dispense:  20 tablet    Refill:  0    Order Specific Question:   Supervising Provider    Answer:   Ihor Gully D [[id number]]  Pt advised he needs to see Urology for evaluation.  Pt advised he may need referral from primary.  I will culture urine  And change antibiotic to [place], PA-C 04/21/16 2044

## 2016-04-23 LAB — URINE CULTURE: CULTURE: NO GROWTH

## 2016-05-30 ENCOUNTER — Ambulatory Visit (HOSPITAL_COMMUNITY)
Admission: EM | Admit: 2016-05-30 | Discharge: 2016-05-30 | Disposition: A | Discharge status: Home / Self Care | Payer: Medicaid Other | Attending: Emergency Medicine | Admitting: Emergency Medicine

## 2016-05-30 ENCOUNTER — Encounter (HOSPITAL_COMMUNITY): Payer: Self-pay | Admitting: Emergency Medicine

## 2016-05-30 DIAGNOSIS — R29898 Other symptoms and signs involving the musculoskeletal system: Secondary | ICD-10-CM

## 2016-05-30 DIAGNOSIS — M545 Low back pain, unspecified: Secondary | ICD-10-CM

## 2016-05-30 DIAGNOSIS — T148XXA Other injury of unspecified body region, initial encounter: Secondary | ICD-10-CM

## 2016-05-30 DIAGNOSIS — Z794 Long term (current) use of insulin: Secondary | ICD-10-CM

## 2016-05-30 DIAGNOSIS — E1129 Type 2 diabetes mellitus with other diabetic kidney complication: Secondary | ICD-10-CM

## 2016-05-30 DIAGNOSIS — R350 Frequency of micturition: Secondary | ICD-10-CM

## 2016-05-30 LAB — POCT URINALYSIS DIP (DEVICE)
Bilirubin Urine: NEGATIVE
GLUCOSE, UA: NEGATIVE mg/dL
Ketones, ur: NEGATIVE mg/dL
LEUKOCYTES UA: NEGATIVE
NITRITE: NEGATIVE
Specific Gravity, Urine: 1.02 (ref 1.005–1.030)
Urobilinogen, UA: 0.2 mg/dL (ref 0.0–1.0)
pH: 6 (ref 5.0–8.0)

## 2016-05-30 MED ORDER — INSULIN GLARGINE 100 UNIT/ML ~~LOC~~ SOLN: SUBCUTANEOUS | 0 refills | Status: DC

## 2016-05-30 NOTE — Discharge Instructions (Signed)
It is likely that your urinary frequency is coming from elevated blood sugars. Your urinalysis does not suggest an infection. You did have elevated protein which also suggests that he may be having additional kidney problems. Your blood pressure is elevated, your showing some early signs of kidney impairment. You must check your blood sugars and record them at least twice daily. Follow-up with a primary care  provider as soon as possible. Your back pain is likely due to muscle pain which is secondary to deconditioning and inactivity. Recommend applying heat and performing stretches. Do not recommend taking NSAIDs such as Advil or Aleve because of your kidneys.

## 2016-05-30 NOTE — ED Provider Notes (Signed)
CSN: [id number]     Arrival date & time 05/30/16  1010 History   First MD Initiated Contact with Patient 05/30/16 1116     Chief Complaint  Patient presents with  . Back Pain   (Consider location/radiation/quality/duration/timing/severity/associated sxs/prior Treatment) Morbidly obese 26 year old male presents the urgent care with his mother stating that he has had pain across the low mid back for several weeks. It tends to come and go. It is never severe. Sometimes it is worse with walking. His mom states his activity level is primarily sitting and lying down most the time. He does not work. He also complains of a malodorous urine. Denies urethral discharge.  After examination the mother states that the real reason they are here is to get a refill on his insulin. They did not bring a bottle or any sort of evidence or information regarding how much of his Lantus that he takes. He apparently does not have any money to see a physician despite the fact that he has Medicaid coverage on his chart.      Past Medical History:  Diagnosis Date  . Diabetes mellitus   . Hypertension   . Thyroid disease    History reviewed. No pertinent surgical history. Family History  Problem Relation Age of Onset  . Heart disease Mother   . Hypertension Mother    Social History  Substance Use Topics  . Smoking status: Never Smoker  . Smokeless tobacco: Never Used  . Alcohol use No    Review of Systems  Constitutional: Negative.   Respiratory: Negative.   Cardiovascular: Negative for chest pain.  Gastrointestinal: Negative.   Genitourinary: Negative for dysuria, penile pain, scrotal swelling and testicular pain.       Malodorous urine.  Musculoskeletal: Positive for back pain and myalgias.  Skin: Negative.   Neurological: Negative.  Negative for syncope and weakness.  All other systems reviewed and are negative.   Allergies  Patient has no known allergies.  Home Medications   Prior to  Admission medications   Medication Sig Start Date End Date Taking? Authorizing Provider  ACCU-CHEK FASTCLIX LANCETS MISC 1 each by Does not apply route 4 (four) times daily -  before meals and at bedtime. 03/21/14  Yes Mariel Aloe, MD  amLODipine (NORVASC) 10 MG tablet Take 1 tablet (10 mg total) by mouth daily. 11/28/14  Yes Mariel Aloe, MD  Blood Glucose Monitoring Suppl (ACCU-CHEK NANO SMARTVIEW) W/DEVICE KIT 1 Device by Does not apply route 4 (four) times daily -  before meals and at bedtime. 03/21/14  Yes Mariel Aloe, MD  ciprofloxacin (CIPRO) 500 MG tablet Take 1 tablet (500 mg total) by mouth every 12 (twelve) hours. 04/21/16  Yes , PA-C  glucose blood test strip Use as instructed 03/21/14  Yes Mariel Aloe, MD  levothyroxine (SYNTHROID, LEVOTHROID) 150 MCG tablet Take 1 tablet (150 mcg total) by mouth daily. Needs office visit 01/01/16  Yes Ashly Windell Moulding, DO  rosuvastatin (CRESTOR) 20 MG tablet Take 1 tablet (20 mg total) by mouth daily. 11/28/14  Yes Mariel Aloe, MD  insulin glargine (LANTUS) 100 UNIT/ML injection Inject 50 u of insulin into the skin BID 05/30/16   Janne Napoleon, NP   Meds Ordered and Administered this Visit  Medications - No data to display  BP (!) 182/109 (BP Location: Left Wrist)   Pulse 95   Temp 98.2 F (36.8 C) (Oral)   Resp 20   SpO2 100%  No data found.   Physical Exam  Constitutional: He is oriented to person, place, and time. He appears well-developed and well-nourished. No distress.  HENT:  Head: Normocephalic and atraumatic.  Neck: Neck supple.  Cardiovascular: Normal rate.   Pulmonary/Chest: Effort normal and breath sounds normal.  Musculoskeletal: Normal range of motion. He exhibits no edema.  Mild tenderness to deep palpation to the bilateral para lumbar musculature. Patient is able to stand and flex to 80 with no pain. Able to stand and flex bilaterally with minimal discomfort in the low back. He is able to bear weight  solely on the left leg and the right leg without pain. While lying supine the patient is able to do straight leg raises without pain in the back. No lower extremity weakness. Strength is 5 over 5.  Neurological: He is alert and oriented to person, place, and time.  Skin: Skin is warm and dry. No rash noted.  Psychiatric: He has a normal mood and affect.  Nursing note and vitals reviewed.   Urgent Care Course   Clinical Course     Procedures (including critical care time)  Labs Review Labs Reviewed  POCT URINALYSIS DIP (DEVICE) - Abnormal; Notable for the following:       Result Value   Hgb urine dipstick SMALL (*)    Protein, ur >=300 (*)    All other components within normal limits    Imaging Review No results found.   Visual Acuity Review  Right Eye Distance:   Left Eye Distance:   Bilateral Distance:    Right Eye Near:   Left Eye Near:    Bilateral Near:         MDM   1. Urinary frequency   2. Acute bilateral low back pain without sciatica   3. Muscle strain   4. Muscular deconditioning   5. Type 2 diabetes mellitus with other diabetic kidney complication, with long-term current use of insulin (HCC)    It is likely that your urinary frequency is coming from elevated blood sugars. Your urinalysis does not suggest an infection. You did have elevated protein which also suggests that he may be having additional kidney problems. Your blood pressure is elevated, your showing some early signs of kidney impairment. You must check your blood sugars and record them at least twice daily. Follow-up with a primary care  provider as soon as possible. Your back pain is likely due to muscle pain which is secondary to deconditioning and inactivity. Recommend applying heat and performing stretches. Do not recommend taking NSAIDs such as Advil or Aleve because of your kidneys. Meds ordered this encounter  Medications  . insulin glargine (LANTUS) 100 UNIT/ML injection    Sig:  Inject 50 u of insulin into the skin BID    Dispense:  10 mL    Refill:  0    Order Specific Question:   Supervising Provider    Answer:   Carmela Hurt       Janne Napoleon, NP 05/30/16 1146

## 2016-05-30 NOTE — ED Triage Notes (Signed)
The patient presented to the [facility] with a complaint of lower back pain and some mild urinary frequency for 2 weeks. The patient also reported a strong odor.

## 2016-09-11 ENCOUNTER — Emergency Department (HOSPITAL_COMMUNITY): Payer: Medicaid Other

## 2016-09-11 ENCOUNTER — Inpatient Hospital Stay (HOSPITAL_COMMUNITY)
Admission: EM | Admit: 2016-09-11 | Discharge: 2016-09-29 | DRG: 853 | Disposition: A | Discharge status: Home / Self Care | Payer: Medicaid Other | Attending: Internal Medicine | Admitting: Internal Medicine

## 2016-09-11 ENCOUNTER — Inpatient Hospital Stay (HOSPITAL_COMMUNITY): Payer: Medicaid Other

## 2016-09-11 ENCOUNTER — Encounter (HOSPITAL_COMMUNITY): Payer: Self-pay | Admitting: Emergency Medicine

## 2016-09-11 DIAGNOSIS — R0603 Acute respiratory distress: Secondary | ICD-10-CM

## 2016-09-11 DIAGNOSIS — E039 Hypothyroidism, unspecified: Secondary | ICD-10-CM | POA: Diagnosis present

## 2016-09-11 DIAGNOSIS — Z452 Encounter for adjustment and management of vascular access device: Secondary | ICD-10-CM | POA: Diagnosis not present

## 2016-09-11 DIAGNOSIS — J95851 Ventilator associated pneumonia: Secondary | ICD-10-CM

## 2016-09-11 DIAGNOSIS — R509 Fever, unspecified: Secondary | ICD-10-CM

## 2016-09-11 DIAGNOSIS — Y848 Other medical procedures as the cause of abnormal reaction of the patient, or of later complication, without mention of misadventure at the time of the procedure: Secondary | ICD-10-CM | POA: Diagnosis not present

## 2016-09-11 DIAGNOSIS — J9600 Acute respiratory failure, unspecified whether with hypoxia or hypercapnia: Secondary | ICD-10-CM

## 2016-09-11 DIAGNOSIS — J189 Pneumonia, unspecified organism: Secondary | ICD-10-CM | POA: Diagnosis present

## 2016-09-11 DIAGNOSIS — N170 Acute kidney failure with tubular necrosis: Secondary | ICD-10-CM | POA: Diagnosis present

## 2016-09-11 DIAGNOSIS — E875 Hyperkalemia: Secondary | ICD-10-CM

## 2016-09-11 DIAGNOSIS — I120 Hypertensive chronic kidney disease with stage 5 chronic kidney disease or end stage renal disease: Secondary | ICD-10-CM | POA: Diagnosis present

## 2016-09-11 DIAGNOSIS — E872 Acidosis, unspecified: Secondary | ICD-10-CM

## 2016-09-11 DIAGNOSIS — N2581 Secondary hyperparathyroidism of renal origin: Secondary | ICD-10-CM | POA: Diagnosis present

## 2016-09-11 DIAGNOSIS — J9690 Respiratory failure, unspecified, unspecified whether with hypoxia or hypercapnia: Secondary | ICD-10-CM | POA: Diagnosis not present

## 2016-09-11 DIAGNOSIS — R0602 Shortness of breath: Secondary | ICD-10-CM | POA: Diagnosis not present

## 2016-09-11 DIAGNOSIS — J9601 Acute respiratory failure with hypoxia: Secondary | ICD-10-CM | POA: Diagnosis not present

## 2016-09-11 DIAGNOSIS — G4733 Obstructive sleep apnea (adult) (pediatric): Secondary | ICD-10-CM | POA: Diagnosis present

## 2016-09-11 DIAGNOSIS — Z992 Dependence on renal dialysis: Secondary | ICD-10-CM

## 2016-09-11 DIAGNOSIS — I313 Pericardial effusion (noninflammatory): Secondary | ICD-10-CM | POA: Diagnosis not present

## 2016-09-11 DIAGNOSIS — I509 Heart failure, unspecified: Secondary | ICD-10-CM | POA: Diagnosis not present

## 2016-09-11 DIAGNOSIS — I32 Pericarditis in diseases classified elsewhere: Secondary | ICD-10-CM | POA: Diagnosis not present

## 2016-09-11 DIAGNOSIS — N179 Acute kidney failure, unspecified: Secondary | ICD-10-CM

## 2016-09-11 DIAGNOSIS — R451 Restlessness and agitation: Secondary | ICD-10-CM | POA: Diagnosis not present

## 2016-09-11 DIAGNOSIS — D631 Anemia in chronic kidney disease: Secondary | ICD-10-CM | POA: Diagnosis present

## 2016-09-11 DIAGNOSIS — Z9289 Personal history of other medical treatment: Secondary | ICD-10-CM

## 2016-09-11 DIAGNOSIS — E1122 Type 2 diabetes mellitus with diabetic chronic kidney disease: Secondary | ICD-10-CM | POA: Diagnosis present

## 2016-09-11 DIAGNOSIS — E874 Mixed disorder of acid-base balance: Secondary | ICD-10-CM | POA: Diagnosis present

## 2016-09-11 DIAGNOSIS — Z4682 Encounter for fitting and adjustment of non-vascular catheter: Secondary | ICD-10-CM | POA: Diagnosis not present

## 2016-09-11 DIAGNOSIS — A419 Sepsis, unspecified organism: Secondary | ICD-10-CM | POA: Diagnosis present

## 2016-09-11 DIAGNOSIS — R768 Other specified abnormal immunological findings in serum: Secondary | ICD-10-CM | POA: Diagnosis present

## 2016-09-11 DIAGNOSIS — T884XXA Failed or difficult intubation, initial encounter: Secondary | ICD-10-CM

## 2016-09-11 DIAGNOSIS — R0902 Hypoxemia: Secondary | ICD-10-CM

## 2016-09-11 DIAGNOSIS — Z419 Encounter for procedure for purposes other than remedying health state, unspecified: Secondary | ICD-10-CM

## 2016-09-11 DIAGNOSIS — E785 Hyperlipidemia, unspecified: Secondary | ICD-10-CM | POA: Diagnosis present

## 2016-09-11 DIAGNOSIS — J9602 Acute respiratory failure with hypercapnia: Secondary | ICD-10-CM | POA: Diagnosis present

## 2016-09-11 DIAGNOSIS — I10 Essential (primary) hypertension: Secondary | ICD-10-CM | POA: Diagnosis not present

## 2016-09-11 DIAGNOSIS — N186 End stage renal disease: Secondary | ICD-10-CM | POA: Diagnosis present

## 2016-09-11 DIAGNOSIS — J810 Acute pulmonary edema: Secondary | ICD-10-CM

## 2016-09-11 DIAGNOSIS — Z79899 Other long term (current) drug therapy: Secondary | ICD-10-CM

## 2016-09-11 DIAGNOSIS — Z9119 Patient's noncompliance with other medical treatment and regimen: Secondary | ICD-10-CM

## 2016-09-11 DIAGNOSIS — T17590A Other foreign object in bronchus causing asphyxiation, initial encounter: Secondary | ICD-10-CM | POA: Diagnosis not present

## 2016-09-11 DIAGNOSIS — Z6841 Body Mass Index (BMI) 40.0 and over, adult: Secondary | ICD-10-CM | POA: Diagnosis not present

## 2016-09-11 DIAGNOSIS — Z794 Long term (current) use of insulin: Secondary | ICD-10-CM

## 2016-09-11 DIAGNOSIS — G9340 Encephalopathy, unspecified: Secondary | ICD-10-CM | POA: Diagnosis present

## 2016-09-11 DIAGNOSIS — T17500A Unspecified foreign body in bronchus causing asphyxiation, initial encounter: Secondary | ICD-10-CM

## 2016-09-11 DIAGNOSIS — Z01818 Encounter for other preprocedural examination: Secondary | ICD-10-CM

## 2016-09-11 DIAGNOSIS — J811 Chronic pulmonary edema: Secondary | ICD-10-CM | POA: Diagnosis not present

## 2016-09-11 DIAGNOSIS — J9809 Other diseases of bronchus, not elsewhere classified: Secondary | ICD-10-CM

## 2016-09-11 DIAGNOSIS — Y95 Nosocomial condition: Secondary | ICD-10-CM | POA: Diagnosis present

## 2016-09-11 DIAGNOSIS — Z95828 Presence of other vascular implants and grafts: Secondary | ICD-10-CM

## 2016-09-11 DIAGNOSIS — E11649 Type 2 diabetes mellitus with hypoglycemia without coma: Secondary | ICD-10-CM | POA: Diagnosis not present

## 2016-09-11 DIAGNOSIS — E861 Hypovolemia: Secondary | ICD-10-CM | POA: Diagnosis present

## 2016-09-11 DIAGNOSIS — R410 Disorientation, unspecified: Secondary | ICD-10-CM | POA: Diagnosis present

## 2016-09-11 DIAGNOSIS — D573 Sickle-cell trait: Secondary | ICD-10-CM | POA: Diagnosis present

## 2016-09-11 DIAGNOSIS — N189 Chronic kidney disease, unspecified: Secondary | ICD-10-CM

## 2016-09-11 DIAGNOSIS — L8990 Pressure ulcer of unspecified site, unspecified stage: Secondary | ICD-10-CM | POA: Insufficient documentation

## 2016-09-11 DIAGNOSIS — D509 Iron deficiency anemia, unspecified: Secondary | ICD-10-CM | POA: Diagnosis present

## 2016-09-11 DIAGNOSIS — J80 Acute respiratory distress syndrome: Secondary | ICD-10-CM

## 2016-09-11 DIAGNOSIS — Z978 Presence of other specified devices: Secondary | ICD-10-CM

## 2016-09-11 HISTORY — DX: Acute respiratory failure with hypoxia: J96.01

## 2016-09-11 LAB — CBC WITH DIFFERENTIAL/PLATELET
BASOS ABS: 0 10*3/uL (ref 0.0–0.1)
BASOS PCT: 0 %
EOS PCT: 1 %
Eosinophils Absolute: 0.1 10*3/uL (ref 0.0–0.7)
HCT: 21.7 % — ABNORMAL LOW (ref 39.0–52.0)
Hemoglobin: 7 g/dL — ABNORMAL LOW (ref 13.0–17.0)
LYMPHS PCT: 9 %
Lymphs Abs: 1.3 10*3/uL (ref 0.7–4.0)
MCH: 23.4 pg — ABNORMAL LOW (ref 26.0–34.0)
MCHC: 32.3 g/dL (ref 30.0–36.0)
MCV: 72.6 fL — AB (ref 78.0–100.0)
Monocytes Absolute: 0.4 10*3/uL (ref 0.1–1.0)
Monocytes Relative: 3 %
Neutro Abs: 12.8 10*3/uL — ABNORMAL HIGH (ref 1.7–7.7)
Neutrophils Relative %: 87 %
PLATELETS: 273 10*3/uL (ref 150–400)
RBC: 2.99 MIL/uL — AB (ref 4.22–5.81)
RDW: 19.7 % — ABNORMAL HIGH (ref 11.5–15.5)
WBC: 14.6 10*3/uL — AB (ref 4.0–10.5)

## 2016-09-11 LAB — RESPIRATORY PANEL BY PCR
ADENOVIRUS-RVPPCR: NOT DETECTED
Bordetella pertussis: NOT DETECTED
CORONAVIRUS 229E-RVPPCR: NOT DETECTED
CORONAVIRUS NL63-RVPPCR: NOT DETECTED
CORONAVIRUS OC43-RVPPCR: NOT DETECTED
Chlamydophila pneumoniae: NOT DETECTED
Coronavirus HKU1: NOT DETECTED
Influenza A: NOT DETECTED
Influenza B: NOT DETECTED
METAPNEUMOVIRUS-RVPPCR: NOT DETECTED
MYCOPLASMA PNEUMONIAE-RVPPCR: NOT DETECTED
PARAINFLUENZA VIRUS 1-RVPPCR: NOT DETECTED
PARAINFLUENZA VIRUS 2-RVPPCR: NOT DETECTED
Parainfluenza Virus 3: NOT DETECTED
Parainfluenza Virus 4: NOT DETECTED
Respiratory Syncytial Virus: NOT DETECTED
Rhinovirus / Enterovirus: NOT DETECTED

## 2016-09-11 LAB — POCT I-STAT 3, ART BLOOD GAS (G3+)
ACID-BASE DEFICIT: 14 mmol/L — AB (ref 0.0–2.0)
Acid-base deficit: 12 mmol/L — ABNORMAL HIGH (ref 0.0–2.0)
Acid-base deficit: 9 mmol/L — ABNORMAL HIGH (ref 0.0–2.0)
BICARBONATE: 11.9 mmol/L — AB (ref 20.0–28.0)
Bicarbonate: 11.3 mmol/L — ABNORMAL LOW (ref 20.0–28.0)
Bicarbonate: 14.8 mmol/L — ABNORMAL LOW (ref 20.0–28.0)
O2 SAT: 98 %
O2 Saturation: 96 %
O2 Saturation: 97 %
PCO2 ART: 19.4 mmHg — AB (ref 32.0–48.0)
PCO2 ART: 22.2 mmHg — AB (ref 32.0–48.0)
PH ART: 7.338 — AB (ref 7.350–7.450)
PH ART: 7.43 (ref 7.350–7.450)
PO2 ART: 81 mmHg — AB (ref 83.0–108.0)
Patient temperature: 35.9
Patient temperature: 36.2
TCO2: 12 mmol/L (ref 0–100)
TCO2: 12 mmol/L (ref 0–100)
TCO2: 16 mmol/L (ref 0–100)
pCO2 arterial: 20.7 mmHg — ABNORMAL LOW (ref 32.0–48.0)
pH, Arterial: 7.389 (ref 7.350–7.450)
pO2, Arterial: 100 mmHg (ref 83.0–108.0)
pO2, Arterial: 73 mmHg — ABNORMAL LOW (ref 83.0–108.0)

## 2016-09-11 LAB — I-STAT ARTERIAL BLOOD GAS, ED
ACID-BASE DEFICIT: 14 mmol/L — AB (ref 0.0–2.0)
ACID-BASE DEFICIT: 14 mmol/L — AB (ref 0.0–2.0)
Bicarbonate: 14 mmol/L — ABNORMAL LOW (ref 20.0–28.0)
Bicarbonate: 11.9 mmol/L — ABNORMAL LOW (ref 20.0–28.0)
O2 SAT: 65 %
O2 Saturation: 95 %
PH ART: 7.118 — AB (ref 7.350–7.450)
PO2 ART: 44 mmHg — AB (ref 83.0–108.0)
TCO2: 15 mmol/L (ref 0–100)
TCO2: 13 mmol/L (ref 0–100)
pCO2 arterial: 43.2 mmHg (ref 32.0–48.0)
pCO2 arterial: 28.7 mmHg — ABNORMAL LOW (ref 32.0–48.0)
pH, Arterial: 7.227 — ABNORMAL LOW (ref 7.350–7.450)
pO2, Arterial: 91 mmHg (ref 83.0–108.0)

## 2016-09-11 LAB — BASIC METABOLIC PANEL
ANION GAP: 17 — AB (ref 5–15)
BUN: 129 mg/dL — ABNORMAL HIGH (ref 6–20)
CO2: 12 mmol/L — ABNORMAL LOW (ref 22–32)
Calcium: 4.4 mg/dL — CL (ref 8.9–10.3)
Chloride: 108 mmol/L (ref 101–111)
Creatinine, Ser: 13.49 mg/dL — ABNORMAL HIGH (ref 0.61–1.24)
GFR, EST AFRICAN AMERICAN: 5 mL/min — AB (ref >60)
GFR, EST NON AFRICAN AMERICAN: 4 mL/min — AB (ref >60)
GLUCOSE: 166 mg/dL — AB (ref 65–99)
Potassium: 6.1 mmol/L — ABNORMAL HIGH (ref 3.5–5.1)
Sodium: 137 mmol/L (ref 135–145)

## 2016-09-11 LAB — GLUCOSE, CAPILLARY
GLUCOSE-CAPILLARY: 102 mg/dL — AB (ref 65–99)
GLUCOSE-CAPILLARY: 128 mg/dL — AB (ref 65–99)
Glucose-Capillary: 127 mg/dL — ABNORMAL HIGH (ref 65–99)

## 2016-09-11 LAB — I-STAT TROPONIN, ED: TROPONIN I, POC: 0.03 ng/mL (ref 0.00–0.08)

## 2016-09-11 LAB — I-STAT CHEM 8, ED
BUN: 117 mg/dL — ABNORMAL HIGH (ref 6–20)
BUN: 122 mg/dL — ABNORMAL HIGH (ref 6–20)
CALCIUM ION: 0.53 mmol/L — AB (ref 1.15–1.40)
Calcium, Ion: 0.41 mmol/L — CL (ref 1.15–1.40)
Chloride: 110 mmol/L (ref 101–111)
Chloride: 115 mmol/L — ABNORMAL HIGH (ref 101–111)
Creatinine, Ser: 15.3 mg/dL — ABNORMAL HIGH (ref 0.61–1.24)
Creatinine, Ser: 15.4 mg/dL — ABNORMAL HIGH (ref 0.61–1.24)
Glucose, Bld: 161 mg/dL — ABNORMAL HIGH (ref 65–99)
Glucose, Bld: 91 mg/dL (ref 65–99)
HCT: 18 % — ABNORMAL LOW (ref 39.0–52.0)
HEMATOCRIT: 21 % — AB (ref 39.0–52.0)
HEMOGLOBIN: 6.1 g/dL — AB (ref 13.0–17.0)
Hemoglobin: 7.1 g/dL — ABNORMAL LOW (ref 13.0–17.0)
POTASSIUM: 6.1 mmol/L — AB (ref 3.5–5.1)
POTASSIUM: 6.9 mmol/L — AB (ref 3.5–5.1)
SODIUM: 138 mmol/L (ref 135–145)
SODIUM: 134 mmol/L — AB (ref 135–145)
TCO2: 15 mmol/L (ref 0–100)
TCO2: 9 mmol/L (ref 0–100)

## 2016-09-11 LAB — COMPREHENSIVE METABOLIC PANEL
ALBUMIN: 2.6 g/dL — AB (ref 3.5–5.0)
ALK PHOS: 82 U/L (ref 38–126)
ALT: 33 U/L (ref 17–63)
AST: 43 U/L — AB (ref 15–41)
Anion gap: 15 (ref 5–15)
BUN: 130 mg/dL — ABNORMAL HIGH (ref 6–20)
CALCIUM: 5.4 mg/dL — AB (ref 8.9–10.3)
CHLORIDE: 111 mmol/L (ref 101–111)
CO2: 12 mmol/L — AB (ref 22–32)
CREATININE: 13.5 mg/dL — AB (ref 0.61–1.24)
GFR calc Af Amer: 5 mL/min — ABNORMAL LOW (ref >60)
GFR calc non Af Amer: 4 mL/min — ABNORMAL LOW (ref >60)
GLUCOSE: 97 mg/dL (ref 65–99)
Potassium: 6.8 mmol/L (ref 3.5–5.1)
SODIUM: 138 mmol/L (ref 135–145)
Total Bilirubin: 0.7 mg/dL (ref 0.3–1.2)
Total Protein: 6.7 g/dL (ref 6.5–8.1)

## 2016-09-11 LAB — URINALYSIS, ROUTINE W REFLEX MICROSCOPIC
Bilirubin Urine: NEGATIVE
Glucose, UA: 50 mg/dL — AB
Ketones, ur: NEGATIVE mg/dL
Leukocytes, UA: NEGATIVE
Nitrite: NEGATIVE
Protein, ur: >=300 mg/dL — AB
SPECIFIC GRAVITY, URINE: 1.012 (ref 1.005–1.030)
SQUAMOUS EPITHELIAL / LPF: NONE SEEN
pH: 5 (ref 5.0–8.0)

## 2016-09-11 LAB — I-STAT CG4 LACTIC ACID, ED: Lactic Acid, Venous: 0.45 mmol/L — ABNORMAL LOW (ref 0.5–1.9)

## 2016-09-11 LAB — PHOSPHORUS: Phosphorus: 10.8 mg/dL — ABNORMAL HIGH (ref 2.5–4.6)

## 2016-09-11 LAB — ECHOCARDIOGRAM COMPLETE
HEIGHTINCHES: 74 in
WEIGHTICAEL: 6400 [oz_av]

## 2016-09-11 LAB — PROTIME-INR
INR: 1.44
PROTHROMBIN TIME: 17.7 s — AB (ref 11.4–15.2)

## 2016-09-11 LAB — MRSA PCR SCREENING: MRSA by PCR: NEGATIVE

## 2016-09-11 LAB — D-DIMER, QUANTITATIVE: D-Dimer, Quant: 17.77 ug/mL-FEU — ABNORMAL HIGH (ref 0.00–0.50)

## 2016-09-11 LAB — PROCALCITONIN: PROCALCITONIN: 0.13 ng/mL

## 2016-09-11 LAB — CBG MONITORING, ED
GLUCOSE-CAPILLARY: 44 mg/dL — AB (ref 65–99)
GLUCOSE-CAPILLARY: 84 mg/dL (ref 65–99)
GLUCOSE-CAPILLARY: 63 mg/dL — AB (ref 65–99)
Glucose-Capillary: 80 mg/dL (ref 65–99)
Glucose-Capillary: 113 mg/dL — ABNORMAL HIGH (ref 65–99)
Glucose-Capillary: 91 mg/dL (ref 65–99)

## 2016-09-11 LAB — APTT: APTT: 35 s (ref 24–36)

## 2016-09-11 LAB — STREP PNEUMONIAE URINARY ANTIGEN: Strep Pneumo Urinary Antigen: NEGATIVE

## 2016-09-11 LAB — MAGNESIUM: Magnesium: 1.8 mg/dL (ref 1.7–2.4)

## 2016-09-11 LAB — LACTIC ACID, PLASMA
LACTIC ACID, VENOUS: 0.6 mmol/L (ref 0.5–1.9)
Lactic Acid, Venous: 0.8 mmol/L (ref 0.5–1.9)

## 2016-09-11 LAB — ABO/RH: ABO/RH(D): O POS

## 2016-09-11 LAB — TRIGLYCERIDES: Triglycerides: 70 mg/dL (ref <150)

## 2016-09-11 LAB — POC OCCULT BLOOD, ED: FECAL OCCULT BLD: NEGATIVE

## 2016-09-11 LAB — PREPARE RBC (CROSSMATCH)

## 2016-09-11 LAB — TROPONIN I: Troponin I: <0.03 ng/mL (ref <0.03)

## 2016-09-11 LAB — BRAIN NATRIURETIC PEPTIDE: B Natriuretic Peptide: 104.3 pg/mL — ABNORMAL HIGH (ref 0.0–100.0)

## 2016-09-11 MED ORDER — PROPOFOL 1000 MG/100ML IV EMUL
INTRAVENOUS | Status: AC | PRN
  Administered 2016-09-11: 45.939 ug/kg/min via INTRAVENOUS

## 2016-09-11 MED ORDER — FENTANYL 2500MCG IN NS 250ML (10MCG/ML) PREMIX INFUSION
100.0000 ug/h | INTRAVENOUS | Status: DC
  Administered 2016-09-11: 100 ug/h via INTRAVENOUS
  Administered 2016-09-11: 300 ug/h via INTRAVENOUS
  Administered 2016-09-12 (×2): 400 ug/h via INTRAVENOUS
  Administered 2016-09-12 (×2): 300 ug/h via INTRAVENOUS
  Administered 2016-09-13: 400 ug/h via INTRAVENOUS
  Administered 2016-09-13: 300 ug/h via INTRAVENOUS
  Administered 2016-09-13: 200 ug/h via INTRAVENOUS
  Administered 2016-09-14 – 2016-09-15 (×4): 300 ug/h via INTRAVENOUS
  Filled 2016-09-11 (×12): qty 250

## 2016-09-11 MED ORDER — SODIUM CHLORIDE 0.9 % IV SOLN
250.0000 mL | INTRAVENOUS | Status: DC | PRN
  Administered 2016-09-19: 250 mL via INTRAVENOUS

## 2016-09-11 MED ORDER — SODIUM CHLORIDE 0.9 % IV SOLN
3.0000 ug/kg/min | INTRAVENOUS | Status: DC
  Administered 2016-09-11: 3.8 ug/kg/min via INTRAVENOUS
  Administered 2016-09-11: 3.5 ug/kg/min via INTRAVENOUS
  Administered 2016-09-11 – 2016-09-12 (×2): 3 ug/kg/min via INTRAVENOUS
  Filled 2016-09-11 (×5): qty 20

## 2016-09-11 MED ORDER — FENTANYL CITRATE (PF) 100 MCG/2ML IJ SOLN
INTRAMUSCULAR | Status: AC | PRN
  Administered 2016-09-11: 100 ug via INTRAVENOUS

## 2016-09-11 MED ORDER — PIPERACILLIN-TAZOBACTAM IN DEX 2-0.25 GM/50ML IV SOLN
2.2500 g | Freq: Three times a day (TID) | INTRAVENOUS | Status: DC
  Filled 2016-09-11: qty 50

## 2016-09-11 MED ORDER — CHLORHEXIDINE GLUCONATE 0.12% ORAL RINSE (MEDLINE KIT)
15.0000 mL | Freq: Two times a day (BID) | OROMUCOSAL | Status: DC
  Administered 2016-09-11 – 2016-09-24 (×27): 15 mL via OROMUCOSAL

## 2016-09-11 MED ORDER — HEPARIN SODIUM (PORCINE) 1000 UNIT/ML DIALYSIS
1000.0000 [IU] | INTRAMUSCULAR | Status: DC | PRN
  Administered 2016-09-15: 2800 [IU] via INTRAVENOUS_CENTRAL
  Filled 2016-09-11: qty 6
  Filled 2016-09-11: qty 3
  Filled 2016-09-11: qty 6

## 2016-09-11 MED ORDER — CALCIUM CHLORIDE 10 % IV SOLN
INTRAVENOUS | Status: AC | PRN
  Administered 2016-09-11: 1 g via INTRAVENOUS

## 2016-09-11 MED ORDER — PROPOFOL 1000 MG/100ML IV EMUL
INTRAVENOUS | Status: AC
  Administered 2016-09-11: 50 ug/kg/min via INTRAVENOUS
  Filled 2016-09-11: qty 100

## 2016-09-11 MED ORDER — PERFLUTREN LIPID MICROSPHERE
1.0000 mL | INTRAVENOUS | Status: AC | PRN
  Administered 2016-09-11: 3 mL via INTRAVENOUS
  Filled 2016-09-11: qty 10

## 2016-09-11 MED ORDER — PRISMASOL BGK 4/2.5 32-4-2.5 MEQ/L IV SOLN
INTRAVENOUS | Status: DC
  Administered 2016-09-11 – 2016-09-14 (×6): via INTRAVENOUS_CENTRAL
  Filled 2016-09-11 (×8): qty 5000

## 2016-09-11 MED ORDER — DEXTROSE 50 % IV SOLN
INTRAVENOUS | Status: AC
  Filled 2016-09-11: qty 50

## 2016-09-11 MED ORDER — SODIUM CHLORIDE 0.9 % IV SOLN: INTRAVENOUS | Status: DC | PRN

## 2016-09-11 MED ORDER — ONDANSETRON HCL 4 MG/2ML IJ SOLN: 4.0000 mg | Freq: Four times a day (QID) | INTRAMUSCULAR | Status: DC | PRN

## 2016-09-11 MED ORDER — CHLORHEXIDINE GLUCONATE CLOTH 2 % EX PADS
6.0000 | MEDICATED_PAD | Freq: Every day | CUTANEOUS | Status: DC
  Administered 2016-09-12 – 2016-09-24 (×14): 6 via TOPICAL

## 2016-09-11 MED ORDER — PANTOPRAZOLE SODIUM 40 MG IV SOLR
40.0000 mg | Freq: Every day | INTRAVENOUS | Status: DC
  Administered 2016-09-11 – 2016-09-15 (×5): 40 mg via INTRAVENOUS
  Filled 2016-09-11 (×5): qty 40

## 2016-09-11 MED ORDER — PROPOFOL 1000 MG/100ML IV EMUL
25.0000 ug/kg/min | INTRAVENOUS | Status: DC
  Administered 2016-09-11 (×3): 80 ug/kg/min via INTRAVENOUS
  Administered 2016-09-11: 60 ug/kg/min via INTRAVENOUS
  Administered 2016-09-11 (×4): 80 ug/kg/min via INTRAVENOUS
  Administered 2016-09-11: 50 ug/kg/min via INTRAVENOUS
  Administered 2016-09-11 – 2016-09-12 (×19): 80 ug/kg/min via INTRAVENOUS
  Administered 2016-09-13: 70 ug/kg/min via INTRAVENOUS
  Administered 2016-09-13: 60 ug/kg/min via INTRAVENOUS
  Administered 2016-09-13: 40 ug/kg/min via INTRAVENOUS
  Administered 2016-09-13 (×2): 70 ug/kg/min via INTRAVENOUS
  Administered 2016-09-13: 30 ug/kg/min via INTRAVENOUS
  Administered 2016-09-13: 70 ug/kg/min via INTRAVENOUS
  Administered 2016-09-13: 80 ug/kg/min via INTRAVENOUS
  Administered 2016-09-13: 60 ug/kg/min via INTRAVENOUS
  Administered 2016-09-13: 70 ug/kg/min via INTRAVENOUS
  Administered 2016-09-13: 80 ug/kg/min via INTRAVENOUS
  Administered 2016-09-13: 60 ug/kg/min via INTRAVENOUS
  Administered 2016-09-13 (×2): 70 ug/kg/min via INTRAVENOUS
  Administered 2016-09-14 (×2): 60 ug/kg/min via INTRAVENOUS
  Administered 2016-09-14: 50 ug/kg/min via INTRAVENOUS
  Administered 2016-09-14 (×2): 60 ug/kg/min via INTRAVENOUS
  Administered 2016-09-14: 50 ug/kg/min via INTRAVENOUS
  Administered 2016-09-14 (×2): 60 ug/kg/min via INTRAVENOUS
  Administered 2016-09-14: 50 ug/kg/min via INTRAVENOUS
  Administered 2016-09-14: 60 ug/kg/min via INTRAVENOUS
  Administered 2016-09-14 (×2): 50 ug/kg/min via INTRAVENOUS
  Administered 2016-09-14: 60 ug/kg/min via INTRAVENOUS
  Administered 2016-09-14 – 2016-09-15 (×5): 50 ug/kg/min via INTRAVENOUS
  Administered 2016-09-15: 25 ug/kg/min via INTRAVENOUS
  Administered 2016-09-15 (×2): 50 ug/kg/min via INTRAVENOUS
  Filled 2016-09-11 (×3): qty 100
  Filled 2016-09-11: qty 200
  Filled 2016-09-11 (×3): qty 100
  Filled 2016-09-11: qty 200
  Filled 2016-09-11: qty 100
  Filled 2016-09-11: qty 200
  Filled 2016-09-11 (×5): qty 100
  Filled 2016-09-11: qty 200
  Filled 2016-09-11 (×9): qty 100
  Filled 2016-09-11: qty 200
  Filled 2016-09-11 (×3): qty 100
  Filled 2016-09-11: qty 200
  Filled 2016-09-11 (×5): qty 100
  Filled 2016-09-11: qty 200
  Filled 2016-09-11 (×6): qty 100
  Filled 2016-09-11: qty 200
  Filled 2016-09-11: qty 100
  Filled 2016-09-11: qty 200
  Filled 2016-09-11 (×2): qty 100
  Filled 2016-09-11: qty 200
  Filled 2016-09-11 (×3): qty 100
  Filled 2016-09-11: qty 200
  Filled 2016-09-11 (×2): qty 100

## 2016-09-11 MED ORDER — SODIUM CHLORIDE 0.9% FLUSH: 3.0000 mL | INTRAVENOUS | Status: DC | PRN

## 2016-09-11 MED ORDER — CHLORHEXIDINE GLUCONATE 0.12% ORAL RINSE (MEDLINE KIT): 15.0000 mL | Freq: Two times a day (BID) | OROMUCOSAL | Status: DC

## 2016-09-11 MED ORDER — SODIUM CHLORIDE 0.9 % IV SOLN: 250.0000 mL | INTRAVENOUS | Status: DC | PRN

## 2016-09-11 MED ORDER — SODIUM CHLORIDE 0.9 % IV SOLN
2500.0000 mg | Freq: Once | INTRAVENOUS | Status: DC
  Administered 2016-09-11: 2500 mg via INTRAVENOUS
  Filled 2016-09-11: qty 2500

## 2016-09-11 MED ORDER — LEVOFLOXACIN IN D5W 250 MG/50ML IV SOLN
250.0000 mg | INTRAVENOUS | Status: DC
  Filled 2016-09-11: qty 50

## 2016-09-11 MED ORDER — SODIUM CHLORIDE 0.9% FLUSH: 10.0000 mL | INTRAVENOUS | Status: DC | PRN

## 2016-09-11 MED ORDER — SODIUM CHLORIDE 0.9 % FOR CRRT
INTRAVENOUS_CENTRAL | Status: DC | PRN
  Filled 2016-09-11: qty 1000

## 2016-09-11 MED ORDER — ALBUTEROL SULFATE (2.5 MG/3ML) 0.083% IN NEBU: 2.5000 mg | INHALATION_SOLUTION | RESPIRATORY_TRACT | Status: DC | PRN

## 2016-09-11 MED ORDER — DOCUSATE SODIUM 50 MG/5ML PO LIQD
100.0000 mg | Freq: Two times a day (BID) | ORAL | Status: DC | PRN
  Administered 2016-09-17: 100 mg
  Filled 2016-09-11 (×2): qty 10

## 2016-09-11 MED ORDER — PROPOFOL 1000 MG/100ML IV EMUL
0.0000 ug/kg/min | INTRAVENOUS | Status: DC
  Administered 2016-09-11: 50 ug/kg/min via INTRAVENOUS
  Filled 2016-09-11: qty 100

## 2016-09-11 MED ORDER — ARTIFICIAL TEARS OP OINT
1.0000 "application " | TOPICAL_OINTMENT | Freq: Three times a day (TID) | OPHTHALMIC | Status: DC
  Administered 2016-09-11 – 2016-09-13 (×5): 1 via OPHTHALMIC
  Filled 2016-09-11: qty 3.5

## 2016-09-11 MED ORDER — ACETAMINOPHEN 325 MG PO TABS
650.0000 mg | ORAL_TABLET | ORAL | Status: DC | PRN
  Filled 2016-09-11: qty 2

## 2016-09-11 MED ORDER — IPRATROPIUM BROMIDE 0.02 % IN SOLN
1.0000 mg | Freq: Once | RESPIRATORY_TRACT | Status: AC
  Administered 2016-09-11: 1 mg via RESPIRATORY_TRACT
  Filled 2016-09-11: qty 5

## 2016-09-11 MED ORDER — FENTANYL CITRATE (PF) 100 MCG/2ML IJ SOLN
INTRAMUSCULAR | Status: AC
  Filled 2016-09-11: qty 2

## 2016-09-11 MED ORDER — CISATRACURIUM BOLUS VIA INFUSION
0.1000 mg/kg | Freq: Once | INTRAVENOUS | Status: AC
  Administered 2016-09-11: 18.1 mg via INTRAVENOUS
  Filled 2016-09-11: qty 19

## 2016-09-11 MED ORDER — PIPERACILLIN-TAZOBACTAM 3.375 G IVPB 30 MIN
3.3750 g | Freq: Once | INTRAVENOUS | Status: AC
  Administered 2016-09-11: 3.375 g via INTRAVENOUS
  Filled 2016-09-11: qty 50

## 2016-09-11 MED ORDER — ROCURONIUM BROMIDE 50 MG/5ML IV SOLN
INTRAVENOUS | Status: AC | PRN
  Administered 2016-09-11 (×2): 50 mg via INTRAVENOUS

## 2016-09-11 MED ORDER — FENTANYL BOLUS VIA INFUSION
50.0000 ug | INTRAVENOUS | Status: DC | PRN
  Administered 2016-09-12: 100 ug via INTRAVENOUS
  Filled 2016-09-11: qty 50

## 2016-09-11 MED ORDER — METHYLPREDNISOLONE SODIUM SUCC 125 MG IJ SOLR
125.0000 mg | Freq: Once | INTRAMUSCULAR | Status: AC
  Administered 2016-09-11: 125 mg via INTRAVENOUS
  Filled 2016-09-11: qty 2

## 2016-09-11 MED ORDER — FENTANYL CITRATE (PF) 100 MCG/2ML IJ SOLN: 100.0000 ug | Freq: Once | INTRAMUSCULAR | Status: DC

## 2016-09-11 MED ORDER — FENTANYL BOLUS VIA INFUSION
50.0000 ug | INTRAVENOUS | Status: DC | PRN
  Filled 2016-09-11: qty 50

## 2016-09-11 MED ORDER — LEVOFLOXACIN IN D5W 500 MG/100ML IV SOLN
500.0000 mg | Freq: Once | INTRAVENOUS | Status: AC
  Administered 2016-09-11: 500 mg via INTRAVENOUS
  Filled 2016-09-11: qty 100

## 2016-09-11 MED ORDER — SODIUM CHLORIDE 0.9% FLUSH
3.0000 mL | Freq: Two times a day (BID) | INTRAVENOUS | Status: DC
  Administered 2016-09-11 – 2016-09-12 (×4): 3 mL via INTRAVENOUS

## 2016-09-11 MED ORDER — FENTANYL CITRATE (PF) 100 MCG/2ML IJ SOLN: 50.0000 ug | Freq: Once | INTRAMUSCULAR | Status: DC

## 2016-09-11 MED ORDER — SODIUM CHLORIDE 0.9 % IV SOLN: Freq: Once | INTRAVENOUS | Status: DC

## 2016-09-11 MED ORDER — DEXTROSE 5 % IV SOLN
2.0000 g | INTRAVENOUS | Status: DC
  Administered 2016-09-11: 2 g via INTRAVENOUS
  Filled 2016-09-11 (×2): qty 2

## 2016-09-11 MED ORDER — LEVOFLOXACIN IN D5W 750 MG/150ML IV SOLN
750.0000 mg | INTRAVENOUS | Status: DC
  Filled 2016-09-11: qty 150

## 2016-09-11 MED ORDER — DEXTROSE 50 % IV SOLN
1.0000 | Freq: Once | INTRAVENOUS | Status: AC
  Administered 2016-09-11: 50 mL via INTRAVENOUS
  Filled 2016-09-11: qty 50

## 2016-09-11 MED ORDER — SODIUM CHLORIDE 0.9% FLUSH
10.0000 mL | Freq: Two times a day (BID) | INTRAVENOUS | Status: DC
  Administered 2016-09-12 – 2016-09-15 (×6): 10 mL
  Administered 2016-09-15: 20 mL
  Administered 2016-09-16 (×2): 10 mL
  Administered 2016-09-17: 40 mL
  Administered 2016-09-17: 30 mL
  Administered 2016-09-18: 10 mL
  Administered 2016-09-18: 30 mL
  Administered 2016-09-19: 10 mL
  Administered 2016-09-20: 20 mL
  Administered 2016-09-21: 10 mL
  Administered 2016-09-21: 30 mL
  Administered 2016-09-22 – 2016-09-29 (×13): 10 mL

## 2016-09-11 MED ORDER — MIDAZOLAM HCL 5 MG/5ML IJ SOLN
INTRAMUSCULAR | Status: AC | PRN
  Administered 2016-09-11: 2 mg via INTRAVENOUS

## 2016-09-11 MED ORDER — DEXTROSE 50 % IV SOLN
1.0000 | Freq: Once | INTRAVENOUS | Status: AC
  Administered 2016-09-11: 50 mL via INTRAVENOUS

## 2016-09-11 MED ORDER — HYDRALAZINE HCL 20 MG/ML IJ SOLN
10.0000 mg | Freq: Once | INTRAMUSCULAR | Status: AC
  Administered 2016-09-11: 10 mg via INTRAVENOUS
  Filled 2016-09-11: qty 1

## 2016-09-11 MED ORDER — MIDAZOLAM HCL 2 MG/2ML IJ SOLN
INTRAMUSCULAR | Status: AC
  Filled 2016-09-11: qty 2

## 2016-09-11 MED ORDER — ALTEPLASE 2 MG IJ SOLR
2.0000 mg | Freq: Once | INTRAMUSCULAR | Status: DC | PRN
  Filled 2016-09-11: qty 2

## 2016-09-11 MED ORDER — DOXERCALCIFEROL 4 MCG/2ML IV SOLN
4.0000 ug | Freq: Once | INTRAVENOUS | Status: DC
  Filled 2016-09-11: qty 2

## 2016-09-11 MED ORDER — DEXTROSE 5 % IV SOLN
INTRAVENOUS | Status: DC
  Administered 2016-09-11 – 2016-09-12 (×3): via INTRAVENOUS
  Filled 2016-09-11 (×8): qty 150

## 2016-09-11 MED ORDER — VANCOMYCIN HCL IN DEXTROSE 1-5 GM/200ML-% IV SOLN: 1000.0000 mg | Freq: Once | INTRAVENOUS | Status: DC

## 2016-09-11 MED ORDER — ORAL CARE MOUTH RINSE: 15.0000 mL | Freq: Four times a day (QID) | OROMUCOSAL | Status: DC

## 2016-09-11 MED ORDER — ORAL CARE MOUTH RINSE
15.0000 mL | Freq: Four times a day (QID) | OROMUCOSAL | Status: DC
  Administered 2016-09-11 – 2016-09-24 (×51): 15 mL via OROMUCOSAL

## 2016-09-11 MED ORDER — FENTANYL CITRATE (PF) 100 MCG/2ML IJ SOLN
100.0000 ug | Freq: Once | INTRAMUSCULAR | Status: AC | PRN
  Administered 2016-09-12: 100 ug via INTRAVENOUS

## 2016-09-11 MED ORDER — CALCIUM GLUCONATE 10 % IV SOLN
4.0000 g | Freq: Once | INTRAVENOUS | Status: AC
  Administered 2016-09-11: 4 g via INTRAVENOUS
  Filled 2016-09-11: qty 40

## 2016-09-11 MED ORDER — ALBUTEROL SULFATE (2.5 MG/3ML) 0.083% IN NEBU
5.0000 mg | INHALATION_SOLUTION | Freq: Once | RESPIRATORY_TRACT | Status: AC
  Administered 2016-09-11: 5 mg via RESPIRATORY_TRACT
  Filled 2016-09-11: qty 6

## 2016-09-11 MED ORDER — FENTANYL 2500MCG IN NS 250ML (10MCG/ML) PREMIX INFUSION
25.0000 ug/h | INTRAVENOUS | Status: DC
  Filled 2016-09-11: qty 250

## 2016-09-11 MED ORDER — FUROSEMIDE 10 MG/ML IJ SOLN
160.0000 mg | Freq: Once | INTRAVENOUS | Status: DC
  Filled 2016-09-11: qty 16

## 2016-09-11 MED ORDER — PRISMASOL BGK 4/2.5 32-4-2.5 MEQ/L IV SOLN
INTRAVENOUS | Status: DC
  Administered 2016-09-11 – 2016-09-14 (×4): via INTRAVENOUS_CENTRAL
  Filled 2016-09-11 (×7): qty 5000

## 2016-09-11 MED ORDER — FUROSEMIDE 10 MG/ML IJ SOLN
40.0000 mg | Freq: Once | INTRAMUSCULAR | Status: AC
  Administered 2016-09-11: 40 mg via INTRAVENOUS
  Filled 2016-09-11: qty 4

## 2016-09-11 MED ORDER — CALCIUM GLUCONATE 10 % IV SOLN
1.0000 g | Freq: Once | INTRAVENOUS | Status: AC
  Administered 2016-09-11: 1 g via INTRAVENOUS
  Filled 2016-09-11: qty 10

## 2016-09-11 MED ORDER — SODIUM BICARBONATE 8.4 % IV SOLN
INTRAVENOUS | Status: DC
  Administered 2016-09-11: 08:00:00 via INTRAVENOUS
  Filled 2016-09-11 (×2): qty 150

## 2016-09-11 MED ORDER — PRISMASOL BGK 4/2.5 32-4-2.5 MEQ/L IV SOLN
INTRAVENOUS | Status: DC
  Administered 2016-09-11 – 2016-09-15 (×39): via INTRAVENOUS_CENTRAL
  Filled 2016-09-11 (×54): qty 5000

## 2016-09-11 MED ORDER — PROPOFOL 1000 MG/100ML IV EMUL
INTRAVENOUS | Status: AC
  Filled 2016-09-11: qty 100

## 2016-09-11 NOTE — Procedures (Signed)
Intubation Procedure Note Paul [name] [id number] 01/23/1991  Procedure: Intubation Indications: Respiratory insufficiency  Procedure Details Consent: Risks of procedure as well as the alternatives and risks of each were explained to the (patient/caregiver).  Consent for procedure obtained. Time Out: Verified patient identification, verified procedure, site/side was marked, verified correct patient position, special equipment/implants available, medications/allergies/relevent history reviewed, required imaging and test results available.  Performed  Maximum sterile technique was used including cap, gloves, gown, hand hygiene and mask.  MAC and 4    Evaluation Hemodynamic Status: BP stable throughout; O2 sats: stable throughout Patient's Current Condition: unstable Complications: No apparent complications Patient did tolerate procedure well. Chest X-ray ordered to verify placement.  CXR: pending.   [name]. 09/11/2016  Anterior on glide with floppy covering epiglottis

## 2016-09-11 NOTE — H&P (Addendum)
PULMONARY / CRITICAL CARE MEDICINE   Name: Paul [name] MRN: [id number] DOB: 1990-10-21    ADMISSION DATE:  09/11/2016  REFERRING MD:  EDP , Dr. Leonides Schanz   CHIEF COMPLAINT:  "Cant breathe "   HISTORY OF PRESENT ILLNESS:   26 yo male with DM , HTN , Hyperlipidemia , Renal Insufficiency and Morbid obesity presented to ER via EMS emergently with confusion and respiratory distress . On arrival found to have BS at 58. Given D50 by EMS . Pt w/ hypoxia,  tachypnea , temp 94. and in resp distress placed on BIPAP support. Says has had cold/bronchitic symptoms for last 2 weeks with cough/congestion . Seen by PCP 1 week ago, given Zpack with no improvement and progression of sob with lower extremity swelling. Labs returned showed severe acidosis with pH at 7.118, PCO2 43, PO 44, HCO3 14. Hbg 6.1, K 6.9 , Scr 15  . CXR showed diffuse interstitial marking ? puml edema . Lactic acid 0.45 . Troponin neg.  D Dimer 17. Pt was started on Vanc and Zosyn . b/p elevated on arrival , w/ no hypotension. In ER he was given lasix 25m x 1 , solumedrol x 1  . Given D50 x 2 , started on HCO3 drip , calcium given and albuterol given . On arrival to ER , pt in severe resp distress on BIPAP . Still alert but very agitated with increased wob, accessory use and tachypnea . Pt will require emergent intubation and Hemodialysis . Mother and Sister at bedside with update given .    PAST MEDICAL HISTORY :  He  has a past medical history of Diabetes mellitus; Hypertension; and Thyroid disease.  PAST SURGICAL HISTORY: He  has no past surgical history on file.  No Known Allergies  No current facility-administered medications on file prior to encounter.    Current Outpatient Prescriptions on File Prior to Encounter  Medication Sig  . amLODipine (NORVASC) 10 MG tablet Take 1 tablet (10 mg total) by mouth daily.  . insulin glargine (LANTUS) 100 UNIT/ML injection Inject 50 u of insulin into the skin BID (Patient taking differently:  Inject 50 Units into the skin 2 (two) times daily. Inject 50 u of insulin into the skin BID)  . levothyroxine (SYNTHROID, LEVOTHROID) 150 MCG tablet Take 1 tablet (150 mcg total) by mouth daily. Needs office visit  . rosuvastatin (CRESTOR) 20 MG tablet Take 1 tablet (20 mg total) by mouth daily.  .Marland KitchenACCU-CHEK FASTCLIX LANCETS MISC 1 each by Does not apply route 4 (four) times daily -  before meals and at bedtime.  . Blood Glucose Monitoring Suppl (ACCU-CHEK NANO SMARTVIEW) W/DEVICE KIT 1 Device by Does not apply route 4 (four) times daily -  before meals and at bedtime.  . ciprofloxacin (CIPRO) 500 MG tablet Take 1 tablet (500 mg total) by mouth every 12 (twelve) hours. (Patient not taking: Reported on 09/11/2016)  . glucose blood test strip Use as instructed    FAMILY HISTORY:  His indicated that the status of his mother is unknown.    SOCIAL HISTORY: He  reports that he has never smoked. He has never used smokeless tobacco. He reports that he does not drink alcohol or use drugs.  REVIEW OF SYSTEMS:   Constitutional:   No  weight loss, night sweats,  Fevers, chills, + fatigue, or  lassitude.  HEENT:   No headaches,  Difficulty swallowing,  Tooth/dental problems, or  Sore throat,  No sneezing, itching, ear ache, nasal congestion, post nasal drip,   CV:  No chest pain,  ++Orthopnea, PND,  +swelling in lower extremities, anasarca, dizziness, palpitations, syncope.   GI  No heartburn, indigestion, abdominal pain, nausea, vomiting, diarrhea, change in bowel habits, loss of appetite, bloody stools.   Resp: ++shortness of breath with exertion or at rest.  ++ non-productive cough,  No coughing up of blood.    Skin: no rash or lesions.  GU: no dysuria, change in color of urine, no urgency or frequency.  No flank pain, no hematuria   MS:  No joint pain or swelling.  No decreased range of motion.  No back pain.  Psych:  No change in mood or affect. No depression or anxiety.  No  memory loss.       SUBJECTIVE:  Cough /congestion and sob for 2 weeks   VITAL SIGNS: BP (!) 158/106   Pulse 94   Temp (!) 94.9 F (34.9 C)   Resp (!) 33   Ht 6' 2"  (1.88 m)   Wt (!) 400 lb (181.4 kg)   SpO2 99%   BMI 51.36 kg/m   HEMODYNAMICS:    VENTILATOR SETTINGS: FiO2 (%):  [60 %] 60 %  INTAKE / OUTPUT: No intake/output data recorded.  PHYSICAL EXAMINATION: General: Morbidly obese male on BIPAP in severe resp distress  Neuro:  Alert/oriented, f/c , agitated HEENT:  [place]/AT, dry mucosa , BIPAP in place  Cardiovascular:  Tachycardia, distant heart sounds , 2-3+ edema  Lungs:  Decreased aeration, no wheezing  Abdomen: morbidly obese , BS hypoactive  Musculoskeletal: intact , mae x 4  Skin:  Intact w/o rash or lesions   LABS:  BMET  Recent Labs Lab 09/11/16 0548 09/11/16 0638 09/11/16 0654  NA 137 138 134*  K 6.1* 6.1* 6.9*  CL 108 110 115*  CO2 12*  --   --   BUN 129* 122* 117*  CREATININE 13.49* 15.40* 15.30*  GLUCOSE 166* 161* 91    Electrolytes  Recent Labs Lab 09/11/16 0548  CALCIUM 4.4*    CBC  Recent Labs Lab 09/11/16 0548 09/11/16 0638 09/11/16 0654  WBC 14.6*  --   --   HGB 7.0* 7.1* 6.1*  HCT 21.7* 21.0* 18.0*  PLT 273  --   --     Coag's No results for input(s): APTT, INR in the last 168 hours.  Sepsis Markers  Recent Labs Lab 09/11/16 0632  LATICACIDVEN 0.45*    ABG  Recent Labs Lab 09/11/16 0615  PHART 7.118*  PCO2ART 43.2  PO2ART 44.0*    Liver Enzymes No results for input(s): AST, ALT, ALKPHOS, BILITOT, ALBUMIN in the last 168 hours.  Cardiac Enzymes No results for input(s): TROPONINI, PROBNP in the last 168 hours.  Glucose  Recent Labs Lab 09/11/16 0535 09/11/16 0607 09/11/16 0635 09/11/16 0727  GLUCAP 44* 84 80 63*    Imaging Dg Chest Portable 1 View  Result Date: 09/11/2016 CLINICAL DATA:  Acute onset of severe shortness of breath. Initial encounter. EXAM: PORTABLE CHEST 1 VIEW  COMPARISON:  None. FINDINGS: The lungs are well-aerated. Vascular congestion is noted. Diffusely increased interstitial markings raise concern for pulmonary edema. No definite pleural effusion or pneumothorax is seen. The cardiomediastinal silhouette is enlarged. No acute osseous abnormalities are seen. IMPRESSION: Vascular congestion and cardiomegaly. Diffusely increased interstitial markings raise concern for pulmonary edema. Electronically Signed   By: Garald Balding M.D.   On: 09/11/2016 05:56  STUDIES:    CULTURES: 4/22 BC x 2 >> 4/22 Sputum CX >> 4/22 Viral panel   ANTIBIOTICS: 4/22 Levaquin >> 4/22 Rocephin >>  SIGNIFICANT EVENTS:   LINES/TUBES: 4/22 ETT >> 4/22 HD cath >>  DISCUSSION: 26 yo male morbidly obese with HTN/DM presented with severe metabolic acidosis , acute renal failure and acute hypoxic resp failure with pulmonary edema.   ASSESSMENT / PLAN:  PULMONARY A: Acute hypoxic respiratory failure with pulmonary edema w/ BIPAP failure   P:   Emergent Intubation  Vent support  VAP prevention  Check ABG in 1 hr and q4.  Repeat CXR now and in am  Check viral panel ,sputum cx , strep pneum/leg  Emergent HD   CARDIOVASCULAR A:  HTN Tachycardia   P:  Check 2 D echo  Cycle card enzymes Check EKG   RENAL A:   Acute Renal Failure  Metabolic Acidosis  Hyperkalemia   P:   Renal consult for emergent HD  Place HD cath  HCO3 Drip at 125cc/hr    GASTROINTESTINAL A:    P:   PPI  Begin TF in am  Check occult stool   HEMATOLOGIC A:   Severe Anemia ? Etiology  P:  Transfuse 1 U PRBC w/ HD  Repeat CBC at 1400  INFECTIOUS A:  ?Sepsis (LA low )   P:   Pan Cx , check viral panel, strep/leg ag Empiric ABX with Rocephin/Levaquin  Tr cbc/temp  Bair hugger  Check PCT   ENDOCRINE A:   DM  Hypoglycemia    P:   cbg q1hr  Hold SSI for now   NEUROLOGIC A:   Acute encephalopathy  P:   RASS goal: -1 to -2  Neuromuscular protocol w/  Nimbex -vent Asyncrony .     FAMILY  - Updates: Mother and sister updated at bedside 4/22   - Inter-disciplinary family meet or Palliative Care meeting due by:  09/18/16    Rexene Edison NP-C  Pulmonary and [place] Pager: [phone]  09/11/2016, 7:49 AM   STAFF NOTE: I, Merrie Roof, MD FACP have personally reviewed patient's available data, including medical history, events of note, physical examination and test results as part of my evaluation. I have discussed with resident/NP and other care providers such as pharmacist, RN and RRT. In addition, I personally evaluated patient and elicited key findings of: Arrived in ER, pt severe distress, agonal, air hungry. lethargic, crackles, jvd, obese, edema 4 plus, pcxr with pulm edema / effusions severe, crt noted, presents after recent URI in setting DM, now with ARF, A resp failure, gross volume overload, hyperkalemia, MODS, he appears to be near arrest, STAT ETT, rate 35, 100%, peep 8, TV 650 , likely will lower this in afternoon to true 8 cc/kg, want to correct [place] STAT so not code from hyperkalemia, place emergent HD cath, d/w renal for emergen renal replacement, add bicarb drip for now, requires high sedation and paralysis, cover atypical and follow pcxr, dc vanc, from home no role pseudo coverage / zosyn, send resp viral panel, will need US renal, neg balance goals important to improve resp status, anemia noted, dilution/ lack of epo, give blood on hD, repeat abg stat, I have updated mom and sister in full x 2, I also repeated calcium, to ICU, echo for r/o effusion given appearance on pcxr, sedate prop, fent, nimbex needed now severe dyschrony The patient is critically ill with multiple organ systems failure and requires high complexity  decision making for assessment and support, frequent evaluation and titration of therapies, application of advanced monitoring technologies and extensive interpretation of  multiple databases.   Critical Care Time devoted to patient care services described in this note is 90 Minutes. This time reflects time of care of this signee: Merrie Roof, MD FACP. This critical care time does not reflect procedure time, or teaching time or supervisory time of PA/NP/Med student/Med Resident etc but could involve care discussion time. Rest per NP/medical resident whose note is outlined above and that I agree with   [name]. Titus Mould, MD, [place] Pgr: [facility] 09/11/2016 12:03 PM

## 2016-09-11 NOTE — ED Notes (Addendum)
Critical care at bedside  

## 2016-09-11 NOTE — Progress Notes (Signed)
Discussed w CCM, we have limited staff for acute HD today, will plan CRRT instead of serial regular HD for now.    Kelly Splinter MD [employer] pgr [phone]   09/11/2016, 12:39 PM

## 2016-09-11 NOTE — Procedures (Signed)
Central Venous Catheter Insertion Procedure Note Paul [name] [id number] 1990/06/12  Procedure: Insertion of Central Venous Catheter Indications: HD  Procedure Details Consent: Risks of procedure as well as the alternatives and risks of each were explained to the (patient/caregiver).  Consent for procedure obtained. Time Out: Verified patient identification, verified procedure, site/side was marked, verified correct patient position, special equipment/implants available, medications/allergies/relevent history reviewed, required imaging and test results available.  Performed  Maximum sterile technique was used including antiseptics, cap, gloves, gown, hand hygiene, mask and sheet. Skin prep: Chlorhexidine; local anesthetic administered A antimicrobial bonded/coated triple lumen catheter was placed in the right internal jugular vein using the Seldinger technique.  Evaluation Blood flow good Complications: No apparent complications Patient did tolerate procedure well. Chest X-ray ordered to verify placement.  CXR: pending.  Paul [name] 09/11/2016, 8:24 AM  US guidance  Paul [name]. Titus Mould, MD, [place] Pgr: [facility]

## 2016-09-11 NOTE — Consult Note (Addendum)
Renal Service Consult Note [facility]  [place] 09/11/2016 [place] D Requesting Physician:  Dr Titus Mould  Reason for Consult:  Renal failure, severe HPI: The patient is a 26 y.o. year-old with long hx of morbid obesity, DM2 on insulin, HTN, HL came to ED this am with AMS, recent bronchitis episode 2 wks ago, legs swelling 1 week and difficulty breathing , 40 breaths/ min in triage.  W/U in ED showed creat 15 (was 1.8 in 2016), pulm edema, met acidosis, hypocalcemia and hyperkalemia.  In ED pt was intubated just now.  ABG showed pH 7.1.  BP's are high.   Asked to see for dialysis by CCM and ED MD.    Pt doesn't provide any hx, hx obtained from old chart and ED notes.    Date  Creat  eGFR  2008-12 0.8- 0.9  2013  1.0 2014  1.63 2016  1.85  51 09/11/16 15.30    ROS n/a   Past Medical History  Past Medical History:  Diagnosis Date  . Diabetes mellitus   . Hypertension   . Thyroid disease    Past Surgical History History reviewed. No pertinent surgical history. Family History  Family History  Problem Relation Age of Onset  . Heart disease Mother   . Hypertension Mother    Social History  reports that he has never smoked. He has never used smokeless tobacco. He reports that he does not drink alcohol or use drugs. Allergies No Known Allergies Home medications Prior to Admission medications   Medication Sig Start Date End Date Taking? Authorizing Provider  amLODipine (NORVASC) 10 MG tablet Take 1 tablet (10 mg total) by mouth daily. 11/28/14  Yes Mariel Aloe, MD  insulin glargine (LANTUS) 100 UNIT/ML injection Inject 50 u of insulin into the skin BID Patient taking differently: Inject 50 Units into the skin 2 (two) times daily. Inject 50 u of insulin into the skin BID 05/30/16  Yes Janne Napoleon, NP  levothyroxine (SYNTHROID, LEVOTHROID) 150 MCG tablet Take 1 tablet (150 mcg total) by mouth daily. Needs office visit 01/01/16  Yes Ashly Windell Moulding, DO   rosuvastatin (CRESTOR) 20 MG tablet Take 1 tablet (20 mg total) by mouth daily. 11/28/14  Yes Mariel Aloe, MD  ACCU-CHEK FASTCLIX LANCETS MISC 1 each by Does not apply route 4 (four) times daily -  before meals and at bedtime. 03/21/14   Mariel Aloe, MD  Blood Glucose Monitoring Suppl (ACCU-CHEK NANO SMARTVIEW) W/DEVICE KIT 1 Device by Does not apply route 4 (four) times daily -  before meals and at bedtime. 03/21/14   Mariel Aloe, MD  ciprofloxacin (CIPRO) 500 MG tablet Take 1 tablet (500 mg total) by mouth every 12 (twelve) hours. Patient not taking: Reported on 09/11/2016 04/21/16   Fransico Meadow, PA-C  glucose blood test strip Use as instructed 03/21/14   Mariel Aloe, MD   Liver Function Tests No results for input(s): AST, ALT, ALKPHOS, BILITOT, PROT, ALBUMIN in the last 168 hours. No results for input(s): LIPASE, AMYLASE in the last 168 hours. CBC  Recent Labs Lab 09/11/16 0548 09/11/16 0638 09/11/16 0654  WBC 14.6*  --   --   NEUTROABS 12.8*  --   --   HGB 7.0* 7.1* 6.1*  HCT 21.7* 21.0* 18.0*  MCV 72.6*  --   --   PLT 273  --   --    Basic Metabolic Panel  Recent Labs Lab 09/11/16 0548 09/11/16 [id number]  09/11/16 0654  NA 137 138 134*  K 6.1* 6.1* 6.9*  CL 108 110 115*  CO2 12*  --   --   GLUCOSE 166* 161* 91  BUN 129* 122* 117*  CREATININE 13.49* 15.40* 15.30*  CALCIUM 4.4*  --   --    Iron/TIBC/Ferritin/ %Sat No results found for: IRON, TIBC, FERRITIN, IRONPCTSAT  Vitals:   09/11/16 0631 09/11/16 0632 09/11/16 0633 09/11/16 0700  BP:  (!) 174/116  (!) 158/106  Pulse: 94 95 95 94  Resp: (!) 43  12 (!) 33  Temp:      SpO2: 99% 100% 99% 99%  Weight:      Height:       Exam Gen obese intubated, sedated No rash, cyanosis or gangrene Sclera anicteric, throat w ETT  No jvd or bruits noted Chest some coarse upper airway noises, o/w clear ant/ lat RRR no MRG tachy Abd soft ntnd no mass or ascites +bs markedly obese, slight SQ flankl edema GU normal  male MS no joint effusions or deformity Ext 2-3+ pitting bilat LE edema below the knees / no wounds or ulcers Neuro is sedated , intubated  Na 134   K 6.9   BUN 117  Cr 15.30   CO2 12  Glu 91   iCa 0.41   Serum Ca 4.4 ABG 7.18/ 43/44 WBC 14k  Hb 6.1   plt 273  EKG NSR  , prolonged QTc 569mec, QRS widening 95 msec    Assessment: 1. 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  Not sure if this is progression of CKD vs AKI. Very low Ca could argue for chronic renal failure.  Would benefit from dialysis acutely for vol overload and severe acidosis.  2. Hypocalcemia - check PTH, may be due to renal failure 3. Met acidosis - treat w Na bicarb gtt and then HD later today 4. HTN uncontrolled - get vol down w HD, use IV bp meds otherwise 5. Vol overload - treat w HD for now, place foley, give lasix 160 IV see if anything happens 6. DM2 per primary 7. Morb obesity    Plan - as above  RKelly SplinterMD CNewell Rubbermaidpager [phone]  09/11/2016, 8:04 AM

## 2016-09-11 NOTE — Progress Notes (Addendum)
Pharmacy Antibiotic Note  Paul [name] is a 26 y.o. morbidly obese male admitted on 09/11/2016 with sepsis.  Patient had bronchitis 2 weeks ago and completed a course of azithromycin.  Pharmacy consulted to transition patient to Levaquin and Rocephin.  Patient has severe AKI (SCr up to 15.3 and it was 1.85 ~1 year ago).  Noted plan for dialysis today d/t hyperkalemia and acidosis.   Plan: - Levaquin 750mg  IV Q48H d/t weight - Rocephin 2gm IV Q24H - Follow renal fxn/dialysis frequency, clinical progress   Height: 6\' 2"  (188 cm) Weight: (!) 400 lb (181.4 kg) IBW/kg (Calculated) : 82.2  Temp (24hrs), Avg:94.9 F (34.9 C), Min:94.9 F (34.9 C), Max:94.9 F (34.9 C)   Recent Labs Lab 09/11/16 0548 09/11/16 0632 09/11/16 0638 09/11/16 0654  WBC 14.6*  --   --   --   CREATININE 13.49*  --  15.40* 15.30*  LATICACIDVEN  --  0.45*  --   --     Estimated Creatinine Clearance: 12.8 mL/min (A) (by C-G formula based on SCr of 15.3 mg/dL (H)).    No Known Allergies   LVQ 4/22 >> CTX 4/22 >> Vanc/Zosyn x 1 in ED  4/22 BCx x2 -   Thuy D. Mina Marble, PharmD, BCPS Pager:  [phone] 09/11/2016, 8:46 AM   Addendum: Now planning CRRT instead of HD. Change levaquin 500mg  IV x 1 then 250mg  IV Q24H and continue ceftriaxone as ordered.  Salome Arnt, PharmD, BCPS Pager # [phone] 09/11/2016 12:51 PM

## 2016-09-11 NOTE — ED Triage Notes (Signed)
BIB EMS with SOB.  Hx bronchitis 2 wks ago.  Z-pack completed.  EMS originally called for confusion.  Glucose 37, then EMS gave D50, glucose came up to 123.  Pt has had leg swelling x1 week.  Pt breathign 40-48 times per minute.

## 2016-09-11 NOTE — Progress Notes (Signed)
Pharmacy Antibiotic Note  Paul [name] is a 26 y.o. male admitted on 09/11/2016 with sepsis.  Pharmacy has been consulted for Vancomycin and Zosyn dosing. Pt with bronchitis 2 wks ago and completed Azithromycin.  Pt with severe AKI (SCr up to 13.5) - noted SCr 1.85 ~ 14yr ago  Plan: Zosyn 3.375gm IV now over 30 min then 2.25gm IV q8h  Vancomycin 2500mg  IV now  Consider random level in 48-72 hours Will f/u micro data, renal function, and pt's clinical condition   Height: 6\' 2"  (188 cm) Weight: (!) 400 lb (181.4 kg) IBW/kg (Calculated) : 82.2  Temp (24hrs), Avg:94.9 F (34.9 C), Min:94.9 F (34.9 C), Max:94.9 F (34.9 C)   Recent Labs Lab 09/11/16 0548  WBC 14.6*    CrCl cannot be calculated (Patient's most recent lab result is older than the maximum 21 days allowed.).    No Known Allergies  Antimicrobials this admission: 4/22 Vanc >>  4/22 Zosyn >>   Dose adjustments this admission: n/a  Microbiology results:  BCx x2:   UCx:    Thank you for allowing pharmacy to be a part of this patient's care.  Sherlon Handing, PharmD, BCPS Clinical pharmacist, pager [phone] 09/11/2016 6:28 AM

## 2016-09-11 NOTE — Progress Notes (Signed)
[facility] Progress Note Patient Name: Paul [name] DOB: 1990-12-13 MRN: [id number]   Date of Service  09/11/2016  HPI/Events of Note  On CVVH with improving acidosis  eICU Interventions  Reduce vent rate from 35 to 30 to prevent him from getting more alkalemic as his hc03 rises     Intervention Category Major Interventions: Acid-Base disturbance - evaluation and management  Christinia Gully 09/11/2016, 8:38 PM

## 2016-09-11 NOTE — ED Notes (Signed)
CBG- 44 RN/MD aware

## 2016-09-11 NOTE — ED Provider Notes (Signed)
TIME SEEN: 5:36 AM  CHIEF COMPLAINT: Shortness of breath  HPI: Patient is a 26 year old morbidly obese male with history of hypertension, hypothyroidism, diabetes who presents to the emergency department with shortness of breath. Seen last week at his PCPs office with shortness of breath and dry cough and was diagnosed with bronchitis and put on azithromycin. States symptoms progressively worsened today. No known fevers. No chest pain or chest discomfort. Has had increased leg and abdominal swelling. No history of CHF. Family reports he has been told that he has mild kidney failure that is being followed and he was recently taken off of metformin and lisinopril. He is on insulin for his diabetes. He presents here hypoglycemic.  ROS:  Level V caveat for respiratory distress  PAST MEDICAL HISTORY/PAST SURGICAL HISTORY:  Past Medical History:  Diagnosis Date  . Diabetes mellitus   . Hypertension   . Thyroid disease     MEDICATIONS:  Prior to Admission medications   Medication Sig Start Date End Date Taking? Authorizing Provider  ACCU-CHEK FASTCLIX LANCETS MISC 1 each by Does not apply route 4 (four) times daily -  before meals and at bedtime. 03/21/14   Mariel Aloe, MD  amLODipine (NORVASC) 10 MG tablet Take 1 tablet (10 mg total) by mouth daily. 11/28/14   Mariel Aloe, MD  Blood Glucose Monitoring Suppl (ACCU-CHEK NANO SMARTVIEW) W/DEVICE KIT 1 Device by Does not apply route 4 (four) times daily -  before meals and at bedtime. 03/21/14   Mariel Aloe, MD  ciprofloxacin (CIPRO) 500 MG tablet Take 1 tablet (500 mg total) by mouth every 12 (twelve) hours. 04/21/16   Fransico Meadow, PA-C  glucose blood test strip Use as instructed 03/21/14   Mariel Aloe, MD  insulin glargine (LANTUS) 100 UNIT/ML injection Inject 50 u of insulin into the skin BID 05/30/16   Janne Napoleon, NP  levothyroxine (SYNTHROID, LEVOTHROID) 150 MCG tablet Take 1 tablet (150 mcg total) by mouth daily. Needs office visit  01/01/16   Janora Norlander, DO  rosuvastatin (CRESTOR) 20 MG tablet Take 1 tablet (20 mg total) by mouth daily. 11/28/14   Mariel Aloe, MD    ALLERGIES:  No Known Allergies  SOCIAL HISTORY:  Social History  Substance Use Topics  . Smoking status: Never Smoker  . Smokeless tobacco: Never Used  . Alcohol use No    FAMILY HISTORY: Family History  Problem Relation Age of Onset  . Heart disease Mother   . Hypertension Mother     EXAM: BP (!) 177/95 (BP Location: Right Arm)   Pulse 95   Resp (!) 40   Ht 6' 2"  (1.88 m)   Wt (!) 400 lb (181.4 kg)   SpO2 95%   BMI 51.36 kg/m  CONSTITUTIONAL: Alert and oriented and responds appropriately to questions. Patient is extremely tachypneic, hypoxic, morbidly obese HEAD: Normocephalic EYES: Conjunctivae clear, pupils appear equal, EOMI ENT: normal nose; moist mucous membranes NECK: Supple, no meningismus, no nuchal rigidity, no LAD  CARD: Regular and tachycardic; S1 and S2 appreciated; no murmurs, no clicks, no rubs, no gallops RESP: Patient is tachypneic with increased work of breathing, hypoxic on room air, diminished aeration diffusely, no rhonchi appreciated, patient able to speak short sentences ABD/GI: Normal bowel sounds; non-distended; soft, non-tender, no rebound, no guarding, no peritoneal signs, no hepatosplenomegaly BACK:  The back appears normal and is non-tender to palpation, there is no CVA tenderness EXT: Normal ROM in all joints; non-tender to  palpation; patient has bilateral pitting edema in his lower extremity is all the way to the lower abdomen; normal capillary refill; no cyanosis, no calf tenderness or swelling    SKIN: Normal color for age and race; warm; no rash NEURO: Moves all extremities equally PSYCH: The patient's mood and manner are appropriate. Grooming and personal hygiene are appropriate.  MEDICAL DECISION MAKING: Patient here in respiratory distress. He appears volume overloaded. He is mildly  hypertensive here. Discussed with family that this could be a CHF exacerbation, pulmonary embolus, pneumonia, reactive airway/bronchospasm. We'll give albuterol and place patient on BiPAP. We'll obtain labs, ABG and chest x-ray.  ED PROGRESS: Patient's chest x-ray shows mild pulmonary edema. He is improving on BiPAP. ABG shows hypoxia but also a metabolic acidosis. I'm concerned now for either lactic acidosis or renal failure. Labs otherwise pending. He is hypothermic here. Will place him under a bear hugger and also give broad-spectrum antibiotics. Because these appears volume overloaded at this time I do not feel that IV fluids are indicated.  Discussed this with patient and family and we all agree.  Patient's labs show acute renal failure with creatinine over 15. Potassium 6.1 without EKG changes. Calcium level is extremely low at 4.4. We'll give IV calcium, continue albuterol, give Lasix. He reports he is still making urine. At this time he continues to be intermittently hypoglycemic after several rounds of D50.  I do not feel giving him insulin for his hyperkalemia is appropriate. We'll discuss with nephrology as I feel he needs emergent dialysis. Will also give hydralazine for his hypertension. I have also discussed with Dr. Nehemiah Settle with hospitalist service and at this time we both agree that patient needs critical care management.   7:26 AM Discussed patient's case with intensivist, Dr. Titus Mould.  I have recommended admission and patient (and family if present) agree with this plan. Admitting physician will place admission orders.   I reviewed all nursing notes, vitals, pertinent previous records, EKGs, lab and urine results, imaging (as available).   He recommends starting bicarbonate drip. Patient and family have been updated that he will be admitted and receive emergent dialysis. Dr. Titus Mould will place dialysis catheter.  7:45 AM  D/w Dr. Jonnie Finner with nephrology consulted for emergent  dialysis. Dr. Titus Mould at bedside and they will intubate patient. They will also start dextrose drip given his continued intermittent hypoglycemia.     EKG Interpretation  Date/Time:  Sunday September 11 2016 05:27:11 EDT Ventricular Rate:  94 PR Interval:    QRS Duration: 105 QT Interval:  418 QTC Calculation: 523 R Axis:   45 Text Interpretation:  Sinus rhythm Probable left atrial enlargement Borderline repolarization abnormality Prolonged QT interval Baseline wander in lead(s) V2 Confirmed by Nicolai Labonte,  DO, Larena Ohnemus ([id number]) on 09/11/2016 7:52:55 AM         CRITICAL CARE Performed by: Nyra Jabs   Total critical care time: 60 minutes  Critical care time was exclusive of separately billable procedures and treating other patients.  Critical care was necessary to treat or prevent imminent or life-threatening deterioration.  Critical care was time spent personally by me on the following activities: development of treatment plan with patient and/or surrogate as well as nursing, discussions with consultants, evaluation of patient's response to treatment, examination of patient, obtaining history from patient or surrogate, ordering and performing treatments and interventions, ordering and review of laboratory studies, ordering and review of radiographic studies, pulse oximetry and re-evaluation of patient's condition.    Cyril Mourning  N Torina Ey, DO 09/11/16 [id number]

## 2016-09-11 NOTE — Code Documentation (Signed)
24 at the teeth 

## 2016-09-11 NOTE — Progress Notes (Signed)
Called to room per RN for Respiratory Distress. On arrival patient is speaking is broken sentence, respirations in the 40's and 50's. Pt appears uncomfortable. Pt has increase WOB noted, SOB, and shows of air hunger. Patient is edeamous, hemoglobin is low , presents with a temperature of 94.55F. Blood sugar is low. Pt states that his chest fills tight. ABG drawn reveals marked acidosis with hypoxia. Pt has a elevated K and Creatinine.  RRT assisted NT w/ bair-hugger to help aid patient body temperature. Pt is currently on BiPAP tolerating it fairly well. Pt is on aggressively settings to help with his respirations status. MD is aware of patient status.

## 2016-09-11 NOTE — Procedures (Signed)
Central Venous Catheter Insertion Procedure Note Paul [name] [id number] Dec 18, 1990  Procedure: Insertion of Central Venous Catheter Indications: Assessment of intravascular volume, Drug and/or fluid administration and Frequent blood sampling  Procedure Details Consent: Risks of procedure as well as the alternatives and risks of each were explained to the (patient/caregiver).  Consent for procedure obtained. Time Out: Verified patient identification, verified procedure, site/side was marked, verified correct patient position, special equipment/implants available, medications/allergies/relevent history reviewed, required imaging and test results available.  Performed  Maximum sterile technique was used including antiseptics, cap, gloves, gown, hand hygiene, mask and sheet. Skin prep: Chlorhexidine; local anesthetic administered A antimicrobial bonded/coated triple lumen catheter was placed in the left internal jugular vein using the Seldinger technique.  Evaluation Blood flow good Complications: No apparent complications Patient did tolerate procedure well. Chest X-ray ordered to verify placement.  CXR: pending.  [name] 09/11/2016, 2:22 PM  [place] [name]. Titus Mould, MD, [place] Pgr: [facility]

## 2016-09-11 NOTE — Procedures (Signed)
Arterial Catheter Insertion Procedure Note Flay Ghosh [id number] [birth year]  Procedure: Insertion of Arterial Catheter  Indications: Blood pressure monitoring  Procedure Details Consent: Unable to obtain consent because of emergent medical necessity. Time Out: Verified patient identification, verified procedure, site/side was marked, verified correct patient position, special equipment/implants available, medications/allergies/relevent history reviewed, required imaging and test results available.  Performed  Maximum sterile technique was used including antiseptics. Skin prep: Chlorhexidine; local anesthetic administered 20 gauge catheter was inserted into right radial artery using the Seldinger technique.  Evaluation Blood flow good; BP tracing good. Complications: No apparent complications   Aline placed per MD order.  Pt on vent and unable to give concent.  RT will continue to monitor.   Pierre Bali 09/11/2016

## 2016-09-11 NOTE — ED Notes (Signed)
Attempted second PIV x 2 in R AC.  One vein rolled.  Other would not thread.

## 2016-09-12 ENCOUNTER — Inpatient Hospital Stay (HOSPITAL_COMMUNITY): Payer: Medicaid Other

## 2016-09-12 LAB — POCT I-STAT 3, ART BLOOD GAS (G3+)
ACID-BASE DEFICIT: 8 mmol/L — AB (ref 0.0–2.0)
ACID-BASE DEFICIT: 1 mmol/L (ref 0.0–2.0)
Acid-base deficit: 5 mmol/L — ABNORMAL HIGH (ref 0.0–2.0)
BICARBONATE: 15.5 mmol/L — AB (ref 20.0–28.0)
Bicarbonate: 17 mmol/L — ABNORMAL LOW (ref 20.0–28.0)
Bicarbonate: 23.4 mmol/L (ref 20.0–28.0)
O2 SAT: 98 %
O2 SAT: 97 %
O2 Saturation: 99 %
PCO2 ART: 19.7 mmHg — AB (ref 32.0–48.0)
PH ART: 7.54 — AB (ref 7.350–7.450)
PO2 ART: 99 mmHg (ref 83.0–108.0)
Patient temperature: 36.1
Patient temperature: 35.9
TCO2: 16 mmol/L (ref 0–100)
TCO2: 18 mmol/L (ref 0–100)
TCO2: 25 mmol/L (ref 0–100)
pCO2 arterial: 21.1 mmHg — ABNORMAL LOW (ref 32.0–48.0)
pCO2 arterial: 37.2 mmHg (ref 32.0–48.0)
pH, Arterial: 7.469 — ABNORMAL HIGH (ref 7.350–7.450)
pH, Arterial: 7.406 (ref 7.350–7.450)
pO2, Arterial: 88 mmHg (ref 83.0–108.0)
pO2, Arterial: 86 mmHg (ref 83.0–108.0)

## 2016-09-12 LAB — BLOOD GAS, ARTERIAL
ACID-BASE DEFICIT: 1.5 mmol/L (ref 0.0–2.0)
Acid-base deficit: 2.7 mmol/L — ABNORMAL HIGH (ref 0.0–2.0)
Acid-base deficit: 1.1 mmol/L (ref 0.0–2.0)
BICARBONATE: 22.8 mmol/L (ref 20.0–28.0)
BICARBONATE: 22.9 mmol/L (ref 20.0–28.0)
Bicarbonate: 21.4 mmol/L (ref 20.0–28.0)
DRAWN BY: 331761
Drawn by: 33176
Drawn by: 331761
FIO2: 0.5
FIO2: 40
FIO2: 40
LHR: 18 {breaths}/min
MECHVT: 650 mL
MECHVT: 650 mL
O2 SAT: 96.7 %
O2 Saturation: 98.1 %
O2 Saturation: 97.2 %
PATIENT TEMPERATURE: 97.7
PATIENT TEMPERATURE: 98.4
PATIENT TEMPERATURE: 98.4
PCO2 ART: 35.2 mmHg (ref 32.0–48.0)
PCO2 ART: 39.1 mmHg (ref 32.0–48.0)
PEEP/CPAP: 8 cmH2O
PEEP: 8 cmH2O
PEEP: 10 cmH2O
PH ART: 7.398 (ref 7.350–7.450)
PO2 ART: 96 mmHg (ref 83.0–108.0)
PO2 ART: 89.5 mmHg (ref 83.0–108.0)
RATE: 18 resp/min
RATE: 18 resp/min
VT: 650 mL
pCO2 arterial: 36.4 mmHg (ref 32.0–48.0)
pH, Arterial: 7.413 (ref 7.350–7.450)
pH, Arterial: 7.384 (ref 7.350–7.450)
pO2, Arterial: 111 mmHg — ABNORMAL HIGH (ref 83.0–108.0)

## 2016-09-12 LAB — CBC
HCT: 16 % — ABNORMAL LOW (ref 39.0–52.0)
HCT: 19.6 % — ABNORMAL LOW (ref 39.0–52.0)
HCT: 23 % — ABNORMAL LOW (ref 39.0–52.0)
Hemoglobin: 5.6 g/dL — CL (ref 13.0–17.0)
Hemoglobin: 6.5 g/dL — CL (ref 13.0–17.0)
Hemoglobin: 7.9 g/dL — ABNORMAL LOW (ref 13.0–17.0)
MCH: 23.9 pg — AB (ref 26.0–34.0)
MCH: 24.4 pg — ABNORMAL LOW (ref 26.0–34.0)
MCH: 25.8 pg — ABNORMAL LOW (ref 26.0–34.0)
MCHC: 33.8 g/dL (ref 30.0–36.0)
MCHC: 33.2 g/dL (ref 30.0–36.0)
MCHC: 34.3 g/dL (ref 30.0–36.0)
MCV: 70.8 fL — AB (ref 78.0–100.0)
MCV: 73.7 fL — ABNORMAL LOW (ref 78.0–100.0)
MCV: 75.2 fL — AB (ref 78.0–100.0)
PLATELETS: 216 10*3/uL (ref 150–400)
PLATELETS: 187 10*3/uL (ref 150–400)
PLATELETS: 203 10*3/uL (ref 150–400)
RBC: 2.26 MIL/uL — ABNORMAL LOW (ref 4.22–5.81)
RBC: 2.66 MIL/uL — AB (ref 4.22–5.81)
RBC: 3.06 MIL/uL — ABNORMAL LOW (ref 4.22–5.81)
RDW: 19.5 % — ABNORMAL HIGH (ref 11.5–15.5)
RDW: 19.8 % — AB (ref 11.5–15.5)
RDW: 19.7 % — AB (ref 11.5–15.5)
WBC: 7.7 10*3/uL (ref 4.0–10.5)
WBC: 8.6 10*3/uL (ref 4.0–10.5)
WBC: 9.2 10*3/uL (ref 4.0–10.5)

## 2016-09-12 LAB — GLUCOSE, CAPILLARY
GLUCOSE-CAPILLARY: 100 mg/dL — AB (ref 65–99)
GLUCOSE-CAPILLARY: 96 mg/dL (ref 65–99)
GLUCOSE-CAPILLARY: 97 mg/dL (ref 65–99)
Glucose-Capillary: 105 mg/dL — ABNORMAL HIGH (ref 65–99)
Glucose-Capillary: 94 mg/dL (ref 65–99)
Glucose-Capillary: 109 mg/dL — ABNORMAL HIGH (ref 65–99)

## 2016-09-12 LAB — MAGNESIUM
Magnesium: 1.9 mg/dL (ref 1.7–2.4)
Magnesium: 2 mg/dL (ref 1.7–2.4)

## 2016-09-12 LAB — PREPARE RBC (CROSSMATCH)

## 2016-09-12 LAB — IRON AND TIBC
Iron: 86 ug/dL (ref 45–182)
Saturation Ratios: 43 % — ABNORMAL HIGH (ref 17.9–39.5)
TIBC: 202 ug/dL — ABNORMAL LOW (ref 250–450)
UIBC: 116 ug/dL

## 2016-09-12 LAB — RENAL FUNCTION PANEL
ANION GAP: 14 (ref 5–15)
Albumin: 2.2 g/dL — ABNORMAL LOW (ref 3.5–5.0)
Albumin: 2.3 g/dL — ABNORMAL LOW (ref 3.5–5.0)
Anion gap: 14 (ref 5–15)
BUN: 86 mg/dL — ABNORMAL HIGH (ref 6–20)
BUN: 65 mg/dL — AB (ref 6–20)
CALCIUM: 5.6 mg/dL — AB (ref 8.9–10.3)
CO2: 18 mmol/L — AB (ref 22–32)
CO2: 22 mmol/L (ref 22–32)
CREATININE: 9.17 mg/dL — AB (ref 0.61–1.24)
Calcium: 5.7 mg/dL — CL (ref 8.9–10.3)
Chloride: 106 mmol/L (ref 101–111)
Chloride: 103 mmol/L (ref 101–111)
Creatinine, Ser: 7.57 mg/dL — ABNORMAL HIGH (ref 0.61–1.24)
GFR calc Af Amer: 8 mL/min — ABNORMAL LOW (ref >60)
GFR calc Af Amer: 10 mL/min — ABNORMAL LOW (ref >60)
GFR calc non Af Amer: 7 mL/min — ABNORMAL LOW (ref >60)
GFR, EST NON AFRICAN AMERICAN: 9 mL/min — AB (ref >60)
GLUCOSE: 104 mg/dL — AB (ref 65–99)
GLUCOSE: 87 mg/dL (ref 65–99)
Phosphorus: 6.6 mg/dL — ABNORMAL HIGH (ref 2.5–4.6)
Phosphorus: 6.1 mg/dL — ABNORMAL HIGH (ref 2.5–4.6)
Potassium: 3.9 mmol/L (ref 3.5–5.1)
Potassium: 4.4 mmol/L (ref 3.5–5.1)
SODIUM: 138 mmol/L (ref 135–145)
Sodium: 139 mmol/L (ref 135–145)

## 2016-09-12 LAB — URINE CULTURE: Culture: NO GROWTH

## 2016-09-12 LAB — PHOSPHORUS: PHOSPHORUS: 6.7 mg/dL — AB (ref 2.5–4.6)

## 2016-09-12 LAB — RETICULOCYTES
RBC.: 2.48 MIL/uL — AB (ref 4.22–5.81)
RETIC COUNT ABSOLUTE: 67 10*3/uL (ref 19.0–186.0)
Retic Ct Pct: 2.7 % (ref 0.4–3.1)

## 2016-09-12 LAB — APTT: APTT: 32 s (ref 24–36)

## 2016-09-12 LAB — HIV ANTIBODY (ROUTINE TESTING W REFLEX): HIV SCREEN 4TH GENERATION: NONREACTIVE

## 2016-09-12 LAB — TECHNOLOGIST SMEAR REVIEW

## 2016-09-12 LAB — FERRITIN: Ferritin: 313 ng/mL (ref 24–336)

## 2016-09-12 LAB — LEGIONELLA PNEUMOPHILA SEROGP 1 UR AG: L. pneumophila Serogp 1 Ur Ag: NEGATIVE

## 2016-09-12 LAB — PARATHYROID HORMONE, INTACT (NO CA): PTH: 159 pg/mL — ABNORMAL HIGH (ref 15–65)

## 2016-09-12 MED ORDER — INSULIN ASPART 100 UNIT/ML ~~LOC~~ SOLN
1.0000 [IU] | SUBCUTANEOUS | Status: DC
  Administered 2016-09-13 – 2016-09-14 (×3): 1 [IU] via SUBCUTANEOUS
  Administered 2016-09-14 – 2016-09-15 (×2): 2 [IU] via SUBCUTANEOUS
  Administered 2016-09-15: 1 [IU] via SUBCUTANEOUS
  Administered 2016-09-15: 2 [IU] via SUBCUTANEOUS
  Administered 2016-09-16 – 2016-09-17 (×5): 1 [IU] via SUBCUTANEOUS
  Administered 2016-09-18 (×2): 2 [IU] via SUBCUTANEOUS
  Administered 2016-09-18: 1 [IU] via SUBCUTANEOUS
  Administered 2016-09-18: 2 [IU] via SUBCUTANEOUS
  Administered 2016-09-18 – 2016-09-19 (×3): 1 [IU] via SUBCUTANEOUS
  Administered 2016-09-19: 2 [IU] via SUBCUTANEOUS
  Administered 2016-09-19 – 2016-09-20 (×6): 1 [IU] via SUBCUTANEOUS
  Administered 2016-09-20 (×2): 2 [IU] via SUBCUTANEOUS
  Administered 2016-09-20 – 2016-09-21 (×4): 1 [IU] via SUBCUTANEOUS
  Administered 2016-09-21: 2 [IU] via SUBCUTANEOUS
  Administered 2016-09-24 – 2016-09-27 (×2): 1 [IU] via SUBCUTANEOUS
  Administered 2016-09-28: 2 [IU] via SUBCUTANEOUS

## 2016-09-12 MED ORDER — VITAL HIGH PROTEIN PO LIQD: 1000.0000 mL | ORAL | Status: DC

## 2016-09-12 MED ORDER — PRO-STAT SUGAR FREE PO LIQD
30.0000 mL | Freq: Two times a day (BID) | ORAL | Status: DC
  Filled 2016-09-12: qty 30

## 2016-09-12 MED ORDER — VITAL HIGH PROTEIN PO LIQD
1000.0000 mL | ORAL | Status: DC
  Administered 2016-09-12 – 2016-09-16 (×5): 1000 mL

## 2016-09-12 MED ORDER — HYDRALAZINE HCL 20 MG/ML IJ SOLN
5.0000 mg | Freq: Four times a day (QID) | INTRAMUSCULAR | Status: DC | PRN
  Administered 2016-09-13: 5 mg via INTRAVENOUS
  Filled 2016-09-12: qty 1

## 2016-09-12 MED ORDER — DEXTROSE 10 % IV SOLN: INTRAVENOUS | Status: DC | PRN

## 2016-09-12 MED ORDER — LEVOFLOXACIN IN D5W 250 MG/50ML IV SOLN
250.0000 mg | INTRAVENOUS | Status: DC
  Administered 2016-09-12 – 2016-09-13 (×2): 250 mg via INTRAVENOUS
  Filled 2016-09-12 (×3): qty 50

## 2016-09-12 MED ORDER — SODIUM CHLORIDE 0.9 % IV SOLN
Freq: Once | INTRAVENOUS | Status: AC
  Administered 2016-09-12: 05:00:00 via INTRAVENOUS

## 2016-09-12 MED ORDER — SODIUM CHLORIDE 0.9 % IV SOLN: Freq: Once | INTRAVENOUS | Status: AC

## 2016-09-12 MED ORDER — PRO-STAT SUGAR FREE PO LIQD
30.0000 mL | Freq: Four times a day (QID) | ORAL | Status: DC
  Administered 2016-09-12 – 2016-09-17 (×18): 30 mL
  Filled 2016-09-12 (×21): qty 30

## 2016-09-12 MED ORDER — SODIUM CHLORIDE 0.9 % IV SOLN
0.5000 mg/h | INTRAVENOUS | Status: DC
  Filled 2016-09-12: qty 10

## 2016-09-12 MED ORDER — AMLODIPINE BESYLATE 10 MG PO TABS
10.0000 mg | ORAL_TABLET | Freq: Every day | ORAL | Status: DC
  Administered 2016-09-12 – 2016-09-16 (×5): 10 mg via ORAL
  Filled 2016-09-12 (×5): qty 1

## 2016-09-12 MED ORDER — SODIUM CHLORIDE 0.9 % IV SOLN
1.0000 g | Freq: Once | INTRAVENOUS | Status: AC
  Administered 2016-09-12: 1 g via INTRAVENOUS
  Filled 2016-09-12: qty 10

## 2016-09-12 NOTE — Progress Notes (Signed)
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  Very low Ca could argue for chronic renal failure.   On CRRT with a goal to pull out 200 cc. BP tolerating  Hypocalcemia:  ?Hypoparathyroidism. ?due renal failure. Ca 5.4->5.6  F/u PTH  Continue Ca-gluconate  Dialy renal function  Met acidosis: bicarb 18. Likely due to renal failure  Na bicarb gtt and then HD later today  Acute Resp Failure: on PRVC  Per primary  Microcytic anemia:  Getting 2nd unit of blood  f/u anemia panel  HTN uncontrolled: BP 165/76  On CRRT  DM2   per primary   Subjective:   Totally sedated.    Objective:   BP (!) 165/76   Pulse 76   Temp 97.7 F (36.5 C)   Resp 18   Ht 6' 2"  (1.88 m)   Wt (!) 418 lb 14 oz (190 kg)   SpO2 100%   BMI 53.78 kg/m   Intake/Output Summary (Last 24 hours) at 09/12/16 0915 Last data filed at 09/12/16 0900  Gross per 24 hour  Intake          7692.09 ml  Output             8961 ml  Net         -1268.91 ml   Weight change: 18 lb 14 oz (8.561 kg)  Physical Exam: Gen: sedated and intubated CVS:RRR Resp:on PRVC Abd: obese and soft.  Ext: trace nonpitting edema, warm to touch  Imaging: US Renal  Result Date: 09/12/2016 CLINICAL DATA:  26 year old male with acute renal failure. EXAM: RENAL / URINARY TRACT ULTRASOUND COMPLETE COMPARISON:  None. FINDINGS: Evaluation is limited due to patient's body habitus. Right Kidney: Length: 12.3 cm. The right kidney is echogenic. No hydronephrosis or shadowing stone. Left Kidney: Length: 10.9 cm. The left kidney is echogenic. No hydronephrosis or shadowing stone. Bladder: The urinary bladder is collapsed and not well visualized. IMPRESSION: Echogenic kidneys consistent with medical renal disease. Correlation with clinical exam and renal  function recommended. Electronically Signed   By: Anner Crete M.D.   On: 09/12/2016 05:01   Dg Chest Port 1 View  Result Date: 09/11/2016 CLINICAL DATA:  New central line placement. EXAM: PORTABLE CHEST 1 VIEW COMPARISON:  09/11/2016 at 8:21 a.m. FINDINGS: New left internal jugular central venous line has its tip projecting in the left brachiocephalic vein. It does not enter the superior vena cava. Endotracheal tube, right internal jugular dual-lumen central venous line and nasogastric tube are stable in well positioned. There has been continued improvement in pulmonary edema, which is now mostly resolved. Cardiac enlargement is stable. No pneumothorax. IMPRESSION: 1. New left internal jugular central venous line catheter tip projects in the left brachiocephalic vein. No pneumothorax. 2. Continued improvement in lung aeration with near complete resolution of the pulmonary edema. 3. Previously described support apparatus is stable and well positioned. Electronically Signed   By: Lajean Manes M.D.   On: 09/11/2016 15:19   Dg Chest Portable 1 View  Result Date: 09/11/2016 CLINICAL DATA:  ET tube placementCentral line placement EXAM: PORTABLE CHEST 1 VIEW COMPARISON:  09/11/2016 at 5:43 a.m. FINDINGS: New endotracheal tube tip projects 4.2 cm above the chronic. Nasal/ orogastric tube passes below the diaphragm well into the stomach and below the included field of  view. Dual-lumen right internal jugular central venous line tip projects in the lower superior vena cava approximately 2 cm above the caval atrial junction. Vascular congestion and bilateral airspace lung opacities have improved consistent with improved pulmonary edema. No pneumothorax. IMPRESSION: 1. Improved pulmonary edema. 2. Support apparatus well positioned as detailed above. No pneumothorax. Electronically Signed   By: Lajean Manes M.D.   On: 09/11/2016 08:31   Dg Chest Portable 1 View  Result Date: 09/11/2016 CLINICAL DATA:  Acute  onset of severe shortness of breath. Initial encounter. EXAM: PORTABLE CHEST 1 VIEW COMPARISON:  None. FINDINGS: The lungs are well-aerated. Vascular congestion is noted. Diffusely increased interstitial markings raise concern for pulmonary edema. No definite pleural effusion or pneumothorax is seen. The cardiomediastinal silhouette is enlarged. No acute osseous abnormalities are seen. IMPRESSION: Vascular congestion and cardiomegaly. Diffusely increased interstitial markings raise concern for pulmonary edema. Electronically Signed   By: Garald Balding M.D.   On: 09/11/2016 05:56    Labs: BMET  Recent Labs Lab 09/11/16 0548 09/11/16 6967 09/11/16 0654 09/11/16 0819 09/12/16 0414  NA 137 138 134* 138 138  K 6.1* 6.1* 6.9* 6.8* 3.9  CL 108 110 115* 111 106  CO2 12*  --   --  12* 18*  GLUCOSE 166* 161* 91 97 104*  BUN 129* 122* 117* 130* 86*  CREATININE 13.49* 15.40* 15.30* 13.50* 9.17*  CALCIUM 4.4*  --   --  5.4* 5.6*  PHOS  --   --   --  10.8* 6.6*   CBC  Recent Labs Lab 09/11/16 0548 09/11/16 0638 09/11/16 0654 09/12/16 0413  WBC 14.6*  --   --  7.7  NEUTROABS 12.8*  --   --   --   HGB 7.0* 7.1* 6.1* 5.6*  HCT 21.7* 21.0* 18.0* 16.0*  MCV 72.6*  --   --  70.8*  PLT 273  --   --  216    Medications:    . amLODipine  10 mg Oral Daily  . artificial tears  1 application Both Eyes E9F  . chlorhexidine gluconate (MEDLINE KIT)  15 mL Mouth Rinse BID  . Chlorhexidine Gluconate Cloth  6 each Topical Daily  . doxercalciferol  4 mcg Intravenous Once  . mouth rinse  15 mL Mouth Rinse QID  . pantoprazole (PROTONIX) IV  40 mg Intravenous QHS  . sodium chloride flush  10-40 mL Intracatheter Q12H  . sodium chloride flush  3 mL Intravenous Q12H      Wendee Beavers, MD.  PGY-2 Pager [phone] 09/12/16  9:15 AM  I have seen and examined this patient and agree with plan and assessment in the above note with renal recommendations/intervention highlighted. By Dr. Cyndia Skeeters.  Pt  tolerating CVVHD with UF and has received his blood products.  Will likely transition him to intermittent HD tomorrow if he continues to tolerate UF.  I am concerned that this is acute recognition of a chronic condition and may not be reversible.  Will continue with workup and follow.  Broadus John A Anayely Constantine,MD 09/12/2016 11:22 AM

## 2016-09-12 NOTE — Progress Notes (Signed)
Initial Nutrition Assessment  DOCUMENTATION CODES:   Morbid obesity  INTERVENTION:    Vital High Protein at 10 ml/h (240 ml per day)  Pro-stat 30 ml QID  Provides 640 kcal, 81 gm protein, 201 ml free water daily  Total intake with Propofol and TF will be 2937 kcal, 81 gm protein  Unable to meet protein needs due to high Propofol dose  NUTRITION DIAGNOSIS:   Inadequate oral intake related to inability to eat as evidenced by NPO status.  GOAL:   Provide needs based on ASPEN/SCCM guidelines  MONITOR:   Vent status, TF tolerance, Skin, Labs, I & O's  REASON FOR ASSESSMENT:   Consult Enteral/tube feeding initiation and management  ASSESSMENT:   26 yo male morbidly obese with HTN/DM presented with severe metabolic acidosis , acute renal failure and acute hypoxic resp failure with pulmonary edema.   Discussed patient in ICU rounds and with RN today. Currently requiring CVVHD with UF. May transition to intermittent HD Received MD Consult for TF initiation and management. Patient is currently intubated on ventilator support Temp (24hrs), Avg:96.9 F (36.1 C), Min:95.4 F (35.2 C), Max:98.1 F (36.7 C)  Propofol: 87 ml/hr providing 2297 kcals from lipid.  Labs reviewed: phosphorus 6.6 (H) Medications reviewed and include Nimbex and Propofol.  Nutrition-Focused physical exam completed. Findings are no fat depletion, no muscle depletion, and moderate edema.   Diet Order:  Diet NPO time specified  Skin:  Wound (see comment) (stage II to groin)  Last BM:  unknown  Height:   Ht Readings from Last 1 Encounters:  09/11/16 6\' 2"  (1.88 m)    Weight:   Wt Readings from Last 1 Encounters:  09/12/16 (!) 418 lb 14 oz (190 kg)    Ideal Body Weight:  86.4 kg  BMI:  Body mass index is 53.78 kg/m.  Estimated Nutritional Needs:   Kcal:  3149-7026  Protein:  216 gm  Fluid:  2 L  EDUCATION NEEDS:   No education needs identified at this time  Molli Barrows, [place], [place], [place] Pager [phone] After Hours Pager [phone]

## 2016-09-12 NOTE — Progress Notes (Signed)
PULMONARY  / CRITICAL CARE MEDICINE  Name: [name] MRN: [id number] DOB: 02/13/1991    LOS: 26  REFERRING MD :  ED  CHIEF COMPLAINT:  "Cant breathe "   HISTORY OF PRESENT ILLNESS:   26 yo male with DM , HTN , Hyperlipidemia , Renal Insufficiency and Morbid obesity presented to ER via EMS emergently with confusion and respiratory distress . On arrival found to have BS at 37. Given D50 by EMS . Pt w/ hypoxia,  tachypnea , temp 94. and in resp distress placed on BIPAP support. Says has had cold/bronchitic symptoms for last 2 weeks with cough/congestion . Seen by PCP 1 week ago, given Zpack with no improvement and progression of sob with lower extremity swelling. Labs returned showed severe acidosis with pH at 7.118, PCO2 43, PO 44, HCO3 14. Hbg 6.1, K 6.9 , Scr 15  . CXR showed diffuse interstitial marking ? puml edema . Lactic acid 0.45 . Troponin neg.  D Dimer 17. Pt was started on Vanc and Zosyn . b/p elevated on arrival , w/ no hypotension. In ER he was given lasix 34m x 1 , solumedrol x 1  . Given D50 x 2 , started on HCO3 drip , calcium given and albuterol given . On arrival to ER , pt in severe resp distress on BIPAP . Still alert but very agitated with increased wob, accessory use and tachypnea . Pt will require emergent intubation and Hemodialysis . Mother and Sister at bedside with update given .    INTERVAL HISTORY: Overnight, rate on vent was decreased given alkalotic pH. Received another pRBC x 1.  PAST MEDICAL HISTORY :  Past Medical History:  Diagnosis Date  . Diabetes mellitus   . Hypertension   . Thyroid disease    History reviewed. No pertinent surgical history. Prior to Admission medications   Medication Sig Start Date End Date Taking? Authorizing Provider  amLODipine (NORVASC) 10 MG tablet Take 1 tablet (10 mg total) by mouth daily. 11/28/14  Yes RMariel Aloe MD  insulin glargine (LANTUS) 100 UNIT/ML injection Inject 50 u of insulin into the skin BID Patient taking  differently: Inject 50 Units into the skin 2 (two) times daily. Inject 50 u of insulin into the skin BID 05/30/16  Yes DJanne Napoleon NP  levothyroxine (SYNTHROID, LEVOTHROID) 150 MCG tablet Take 1 tablet (150 mcg total) by mouth daily. Needs office visit 01/01/16  Yes Ashly MWindell Moulding DO  rosuvastatin (CRESTOR) 20 MG tablet Take 1 tablet (20 mg total) by mouth daily. 11/28/14  Yes RMariel Aloe MD  ACCU-CHEK FASTCLIX LANCETS MISC 1 each by Does not apply route 4 (four) times daily -  before meals and at bedtime. 03/21/14   RMariel Aloe MD  Blood Glucose Monitoring Suppl (ACCU-CHEK NANO SMARTVIEW) W/DEVICE KIT 1 Device by Does not apply route 4 (four) times daily -  before meals and at bedtime. 03/21/14   RMariel Aloe MD  ciprofloxacin (CIPRO) 500 MG tablet Take 1 tablet (500 mg total) by mouth every 12 (twelve) hours. Patient not taking: Reported on 09/11/2016 04/21/16   LFransico Meadow PA-C  glucose blood test strip Use as instructed 03/21/14   RMariel Aloe MD   No Known Allergies  FAMILY HISTORY:  Family History  Problem Relation Age of Onset  . Heart disease Mother   . Hypertension Mother    SOCIAL HISTORY:  reports that he has never smoked. He has never used smokeless tobacco. He  reports that he does not drink alcohol or use drugs.  VITAL SIGNS: Temp:  [95.4 F (35.2 C)-97.5 F (36.4 C)] 97.2 F (36.2 C) (04/23 0715) Pulse Rate:  [68-109] 74 (04/23 0715) Resp:  [18-36] 21 (04/23 0715) BP: (119-197)/(58-94) 163/78 (04/23 0700) SpO2:  [97 %-100 %] 100 % (04/23 0715) Arterial Line BP: (134-253)/(78-130) 186/82 (04/23 0715) FiO2 (%):  [50 %-100 %] 50 % (04/23 0630) Weight:  [418 lb 14 oz (190 kg)] 418 lb 14 oz (190 kg) (04/23 0400) HEMODYNAMICS: CVP:  [13 mmHg-15 mmHg] 14 mmHg VENTILATOR SETTINGS: Vent Mode: PRVC FiO2 (%):  [50 %-100 %] 50 % Set Rate:  [18 bmp-35 bmp] 18 bmp Vt Set:  [650 mL] 650 mL PEEP:  [5 cmH20-8 cmH20] 8 cmH20 Plateau Pressure:  [22 cmH20-33 cmH20]  33 cmH20 INTAKE / OUTPUT: Intake/Output      04/22 0701 - 04/23 0700 04/23 0701 - 04/24 0700   I.V. (mL/kg) 6156.9 (32.4)    Blood 416    NG/GT 60    IV Piggyback 340    Total Intake(mL/kg) 6972.9 (36.7)    Urine (mL/kg/hr) 3485 (0.8)    Emesis/NG output 450 (0.1)    Other 5197 (1.1)    Total Output 9132     Net -2159.1            PHYSICAL EXAMINATION: Physical Exam  Constitutional: No distress.  HENT:  Head: Normocephalic and atraumatic.  ETT in place  Eyes: Conjunctivae are normal. No scleral icterus.  Neck:  L IJ cath, R HD cath in place  Cardiovascular: Normal rate and regular rhythm.   Pulmonary/Chest: Effort normal. No respiratory distress.  Skin: He is not diaphoretic.      LABS: Cbc  Recent Labs Lab 09/11/16 0548 09/11/16 0638 09/11/16 0654 09/12/16 0413  WBC 14.6*  --   --  7.7  HGB 7.0* 7.1* 6.1* 5.6*  HCT 21.7* 21.0* 18.0* 16.0*  PLT 273  --   --  216    Chemistry   Recent Labs Lab 09/11/16 0548  09/11/16 0654 09/11/16 0819 09/12/16 0413 09/12/16 0414  NA 137  < > 134* 138  --  138  K 6.1*  < > 6.9* 6.8*  --  3.9  CL 108  < > 115* 111  --  106  CO2 12*  --   --  12*  --  18*  BUN 129*  < > 117* 130*  --  86*  CREATININE 13.49*  < > 15.30* 13.50*  --  9.17*  CALCIUM 4.4*  --   --  5.4*  --  5.6*  MG  --   --   --  1.8 1.9  --   PHOS  --   --   --  10.8*  --  6.6*  GLUCOSE 166*  < > 91 97  --  104*  < > = values in this interval not displayed.  Liver fxn  Recent Labs Lab 09/11/16 0819 09/12/16 0414  AST 43*  --   ALT 33  --   ALKPHOS 82  --   BILITOT 0.7  --   PROT 6.7  --   ALBUMIN 2.6* 2.2*   coags  Recent Labs Lab 09/11/16 0819 09/12/16 0413  APTT 35 32  INR 1.44  --    Sepsis markers  Recent Labs Lab 09/11/16 0632 09/11/16 0819 09/11/16 0823 09/11/16 1534  LATICACIDVEN 0.45*  --  0.8 0.6  PROCALCITON  --  0.13  --   --  Cardiac markers  Recent Labs Lab 09/11/16 0819  TROPONINI <0.03   BNP No  results for input(s): PROBNP in the last 168 hours. ABG  Recent Labs Lab 09/11/16 2156 09/12/16 0130 09/12/16 0534  PHART 7.430 7.469* 7.540*  PCO2ART 22.2* 21.1* 19.7*  PO2ART 81.0* 88.0 99.0  HCO3 14.8* 15.5* 17.0*  TCO2 16 16 18     CBG trend  Recent Labs Lab 09/11/16 0857 09/11/16 1136 09/11/16 1921 09/11/16 2340 09/12/16 0423  GLUCAP 91 102* 127* 128* 105*    LINES / TUBES: 4/22 ETT >> 4/22 HD cath >> 4/22 L IJ >>  CULTURES: 4/22 BC x 2 >> 4/22 Sputum CX >> 4/22 Viral panel   ANTIBIOTICS: 4/22 Levaquin >> 4/22 Rocephin >>  SIGNIFICANT EVENTS:  4/23: Intubated and dialyzed.  STUDIES:  Renal US 4/23: Medical renal disease. CXR 4/23: Improved edema. Low lung volumes.   DIAGNOSES: Active Problems:   Respiratory failure, acute (HCC)   Pressure injury of skin   Acute renal failure (HCC)   Metabolic acidosis   Acute pulmonary edema (HCC)   ASSESSMENT / PLAN:  PULMONARY A: Acute hypoxic respiratory failure with pulmonary edema w/ BIPAP failure   P:   Continue vent support today pending improvement in pulmonary edema   CARDIOVASCULAR A:  HTN: Echo yesterday with EF 60-65%. Troponin negative x 1 yesterday. BP running 119-197/60s-90s. Tachycardia   P:  Restart home amlodipine 10 mg   RENAL A:   Acute Renal Failure  Metabolic Acidosis  Hyperkalemia: Resolved s/p CRRT.   P:   Follow BMET   GASTROINTESTINAL A:    P:   PPI  Consider TF right now   HEMATOLOGIC A:   Acute microcytic anemia: Received pRBC x 1 with Hb trending down to 5.6 this morning for which another unit was transfused. MCV trending 70s since 2012. P:  Follow post-transfusion H&H Check iron studies and retic count to better assess anemia   INFECTIOUS A: Sepsis: PCT 0.13. CXR appears improved today w/o suggestion of infiltrate.  P:   Consider d/c abx today   ENDOCRINE A:   DM  Hypoglycemia    P:   cbg q1hr  Hold SSI for now     NEUROLOGIC A:   Acute encephalopathy  P:   RASS goal: -1 to -2  Add another sedative with BP lowering effects if still hypertensive   Charlott Rakes, PGY3 Internal Medicine Pager: [phone] 09/12/2016, 7:31 AM

## 2016-09-12 NOTE — Progress Notes (Signed)
CRITICAL VALUE ALERT  Critical value received:  Hgb = 5.6  Date of notification:  09/12/16  Time of notification:  0515  Critical value read back:Yes.    Nurse who received alert:  Delphia Grates   MD notified (1st page):  Dr Danford Bad  Time of first page:  0520  MD notified (2nd page):  Time of second page:  Responding MD:  Dr Danford Bad  Time MD responded:  [phone]

## 2016-09-12 NOTE — Progress Notes (Signed)
Critical value of Calcium 5.7 reported to Dr. Hetty Ely

## 2016-09-12 NOTE — Progress Notes (Signed)
Pt Hgb of 6.5 reported to Dr. Posey Pronto at bedside

## 2016-09-12 NOTE — Progress Notes (Signed)
Received a call about critical value calcium 5.7 ( corrected for low albumin this is calcium 6.7) I spoke with nephrology as they are managing CRRT, they advised to give a calcium bolus.   Ordered IV calcium gluconate

## 2016-09-12 NOTE — Progress Notes (Signed)
CRITICAL VALUE ALERT  Critical value received:  Ca = 5.6, pCO2 = 19.7  Date of notification:  09/12/16  Time of notification:  0524  Critical value read back:Yes.    Nurse who received alert:  Delphia Grates  MD notified (1st page):  Dr Danford Bad  Time of first page:  [phone]  MD notified (2nd page):  Time of second page:  Responding MD:  Dr Danford Bad  Time MD responded:  [phone]

## 2016-09-12 NOTE — Progress Notes (Signed)
eLink Physician-Brief Progress Note Patient Name: Paul [name] DOB: [birth year] MRN: [id number]   Date of Service  09/12/2016  HPI/Events of Note   Recent Labs Lab 09/11/16 0548 09/11/16 0638 09/11/16 0654 09/12/16 0413  HGB 7.0* 7.1* 6.1* 5.6*     eICU Interventions  1 unit prbc     Intervention Category Intermediate Interventions: Other:  Hillman Attig 09/12/2016, 6:35 AM

## 2016-09-12 NOTE — Progress Notes (Signed)
eLink Physician-Brief Progress Note Patient Name: Paul [name] DOB: 27-May-1990 MRN: [id number]   Date of Service  09/12/2016  HPI/Events of Note   Recent Labs Lab 09/11/16 1416 09/11/16 1806 09/11/16 2156 09/12/16 0130 09/12/16 0534  PHART 7.338* 7.389 7.430 7.469* 7.540*  PCO2ART 20.7* 19.4* 22.2* 21.1* 19.7*  PO2ART 100.0 73.0* 81.0* 88.0 99.0  HCO3 11.3* 11.9* 14.8* 15.5* 17.0*  TCO2 12 12 16 16 18   O2SAT 98.0 96.0 97.0 98.0 99.0     eICU Interventions  resp alk  Plan Reduce RR on vent from 30 to 18 Check abg in 1h     Intervention Category Major Interventions: Acid-Base disturbance - evaluation and management  Paul [name] 09/12/2016, 6:04 AM

## 2016-09-13 ENCOUNTER — Inpatient Hospital Stay (HOSPITAL_COMMUNITY): Payer: Medicaid Other

## 2016-09-13 LAB — RENAL FUNCTION PANEL
Albumin: 2.5 g/dL — ABNORMAL LOW (ref 3.5–5.0)
Albumin: 2.6 g/dL — ABNORMAL LOW (ref 3.5–5.0)
Anion gap: 12 (ref 5–15)
Anion gap: 10 (ref 5–15)
BUN: 54 mg/dL — AB (ref 6–20)
BUN: 46 mg/dL — AB (ref 6–20)
CALCIUM: 6.4 mg/dL — AB (ref 8.9–10.3)
CO2: 23 mmol/L (ref 22–32)
CO2: 23 mmol/L (ref 22–32)
CREATININE: 6.07 mg/dL — AB (ref 0.61–1.24)
CREATININE: 5.41 mg/dL — AB (ref 0.61–1.24)
Calcium: 6.1 mg/dL — CL (ref 8.9–10.3)
Chloride: 101 mmol/L (ref 101–111)
Chloride: 102 mmol/L (ref 101–111)
GFR calc Af Amer: 14 mL/min — ABNORMAL LOW (ref >60)
GFR calc non Af Amer: 12 mL/min — ABNORMAL LOW (ref >60)
GFR, EST AFRICAN AMERICAN: 16 mL/min — AB (ref >60)
GFR, EST NON AFRICAN AMERICAN: 14 mL/min — AB (ref >60)
Glucose, Bld: 93 mg/dL (ref 65–99)
Glucose, Bld: 106 mg/dL — ABNORMAL HIGH (ref 65–99)
PHOSPHORUS: 5.7 mg/dL — AB (ref 2.5–4.6)
Phosphorus: 5.7 mg/dL — ABNORMAL HIGH (ref 2.5–4.6)
Potassium: 4.7 mmol/L (ref 3.5–5.1)
Potassium: 4.9 mmol/L (ref 3.5–5.1)
SODIUM: 136 mmol/L (ref 135–145)
SODIUM: 135 mmol/L (ref 135–145)

## 2016-09-13 LAB — BLOOD GAS, ARTERIAL
ACID-BASE DEFICIT: 1 mmol/L (ref 0.0–2.0)
Acid-base deficit: 1.6 mmol/L (ref 0.0–2.0)
BICARBONATE: 23.7 mmol/L (ref 20.0–28.0)
Bicarbonate: 23 mmol/L (ref 20.0–28.0)
DRAWN BY: 331761
Drawn by: 31101
FIO2: 40
FIO2: 40
MECHVT: 650 mL
MECHVT: 650 mL
O2 Saturation: 94 %
O2 Saturation: 93.7 %
PATIENT TEMPERATURE: 98.6
PCO2 ART: 41.5 mmHg (ref 32.0–48.0)
PEEP/CPAP: 8 cmH2O
PEEP/CPAP: 8 cmH2O
PH ART: 7.36 (ref 7.350–7.450)
PO2 ART: 75.3 mmHg — AB (ref 83.0–108.0)
PO2 ART: 72.1 mmHg — AB (ref 83.0–108.0)
Patient temperature: 98.6
RATE: 18 resp/min
RATE: 18 resp/min
pCO2 arterial: 43 mmHg (ref 32.0–48.0)
pH, Arterial: 7.363 (ref 7.350–7.450)

## 2016-09-13 LAB — TYPE AND SCREEN
ABO/RH(D): O POS
Antibody Screen: NEGATIVE
UNIT DIVISION: 0
Unit division: 0
Unit division: 0
Unit division: 0

## 2016-09-13 LAB — CBC
HEMATOCRIT: 25 % — AB (ref 39.0–52.0)
Hemoglobin: 8.2 g/dL — ABNORMAL LOW (ref 13.0–17.0)
MCH: 24.8 pg — ABNORMAL LOW (ref 26.0–34.0)
MCHC: 32.8 g/dL (ref 30.0–36.0)
MCV: 75.5 fL — ABNORMAL LOW (ref 78.0–100.0)
PLATELETS: 222 10*3/uL (ref 150–400)
RBC: 3.31 MIL/uL — ABNORMAL LOW (ref 4.22–5.81)
RDW: 18.7 % — AB (ref 11.5–15.5)
WBC: 9.5 10*3/uL (ref 4.0–10.5)

## 2016-09-13 LAB — BPAM RBC
BLOOD PRODUCT EXPIRATION DATE: 201805012359
BLOOD PRODUCT EXPIRATION DATE: 201805152359
BLOOD PRODUCT EXPIRATION DATE: 201805152359
Blood Product Expiration Date: 201805152359
ISSUE DATE / TIME: 201804221607
ISSUE DATE / TIME: 201804230610
ISSUE DATE / TIME: 201804230832
ISSUE DATE / TIME: 201804231510
UNIT TYPE AND RH: 5100
Unit Type and Rh: 5100
Unit Type and Rh: 5100
Unit Type and Rh: 5100

## 2016-09-13 LAB — GLUCOSE, CAPILLARY
GLUCOSE-CAPILLARY: 85 mg/dL (ref 65–99)
Glucose-Capillary: 97 mg/dL (ref 65–99)
Glucose-Capillary: 110 mg/dL — ABNORMAL HIGH (ref 65–99)
Glucose-Capillary: 106 mg/dL — ABNORMAL HIGH (ref 65–99)
Glucose-Capillary: 138 mg/dL — ABNORMAL HIGH (ref 65–99)

## 2016-09-13 LAB — PROCALCITONIN: Procalcitonin: 0.32 ng/mL

## 2016-09-13 LAB — MPO/PR-3 (ANCA) ANTIBODIES: ANCA Proteinase 3: <3.5 U/mL (ref 0.0–3.5)

## 2016-09-13 LAB — MAGNESIUM: MAGNESIUM: 2.1 mg/dL (ref 1.7–2.4)

## 2016-09-13 LAB — ANTISTREPTOLYSIN O TITER: ASO: 72 [IU]/mL (ref 0.0–200.0)

## 2016-09-13 LAB — ANTI-DNA ANTIBODY, DOUBLE-STRANDED: ds DNA Ab: <1 [IU]/mL (ref 0–9)

## 2016-09-13 LAB — TSH: TSH: 11.554 u[IU]/mL — ABNORMAL HIGH (ref 0.350–4.500)

## 2016-09-13 LAB — FANA STAINING PATTERNS: Speckled Pattern: 1:320 {titer} — ABNORMAL HIGH

## 2016-09-13 LAB — C4 COMPLEMENT: Complement C4, Body Fluid: 33 mg/dL (ref 14–44)

## 2016-09-13 LAB — ANTINUCLEAR ANTIBODIES, IFA: ANA Ab, IFA: POSITIVE — AB

## 2016-09-13 LAB — COMPLEMENT, TOTAL: Compl, Total (CH50): >60 U/mL — ABNORMAL HIGH (ref 42–60)

## 2016-09-13 LAB — APTT: aPTT: 28 seconds (ref 24–36)

## 2016-09-13 LAB — C3 COMPLEMENT: C3 COMPLEMENT: 133 mg/dL (ref 82–167)

## 2016-09-13 MED ORDER — SODIUM CHLORIDE 0.9 % IV SOLN
2.0000 g | Freq: Once | INTRAVENOUS | Status: AC
  Administered 2016-09-13: 2 g via INTRAVENOUS
  Filled 2016-09-13: qty 20

## 2016-09-13 MED ORDER — LABETALOL HCL 5 MG/ML IV SOLN
10.0000 mg | INTRAVENOUS | Status: DC | PRN
  Administered 2016-09-13 – 2016-09-17 (×5): 10 mg via INTRAVENOUS
  Filled 2016-09-13 (×5): qty 4

## 2016-09-13 MED ORDER — HYDRALAZINE HCL 20 MG/ML IJ SOLN
10.0000 mg | Freq: Once | INTRAMUSCULAR | Status: AC
  Administered 2016-09-13: 10 mg via INTRAVENOUS
  Filled 2016-09-13: qty 1

## 2016-09-13 MED ORDER — HYDRALAZINE HCL 20 MG/ML IJ SOLN
10.0000 mg | Freq: Four times a day (QID) | INTRAMUSCULAR | Status: DC
  Administered 2016-09-13 – 2016-09-15 (×8): 10 mg via INTRAVENOUS
  Filled 2016-09-13: qty 0.5
  Filled 2016-09-13: qty 1
  Filled 2016-09-13: qty 0.5
  Filled 2016-09-13: qty 1
  Filled 2016-09-13 (×6): qty 0.5

## 2016-09-13 MED ORDER — HYDRALAZINE HCL 20 MG/ML IJ SOLN: 10.0000 mg | Freq: Four times a day (QID) | INTRAMUSCULAR | Status: DC | PRN

## 2016-09-13 NOTE — Progress Notes (Signed)
[facility] Progress Note    Assessment/ Plan:   Acute and/or chronic renal failure: improving- he had stage III CKD in 2016 with creat 1.8. Serum Cr 13.5 (on admit)-->>6.07.  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  Very low Ca could argue for chronic renal failure. C3, C4 and ASO negative. Tolerating CVVHD.  -9L off in the last 24 hours.   Continue CVVHD. May transition to intermittent  Will take off more fluid. BP elevated to 190/90.   Follow up other autoimmune labs  Continue with CVVHD as his IVF's are > 5 liters/day which would make intermittent HD impossible.  Need to decrease his intake before transitioning to IHD.  Hypocalcemia: ?due renal failure. Ca 5.4->>6.1 (corrects to 7.3). PTH high at 156  Continue Ca-gluconate  Continue hectrol  Dialy renal function  Met acidosis: resolved. Likely due to renal failure  Na bicarb gtt and then HD later today  Acute Resp Failure: RT weaning to PS this morning. Pneumonia  On Levaquin 250 mg   Per primary  Microcytic anemia: anemia panel within normal  s/p 4 units  Daily H&H  HTN uncontrolled: BP 192/90 this morning.   On CVVHD  On Amlodipine and PRN Hydralazine  Would recommend regular schedule of hydralazine '10mg'$  IV q6 and then labetalol prn  DM2: last A1c  9.6 in 2016  On insulin per primary  A1c   Subjective:   Awake and follows command. Reports epigastric pain. He is being weaned to PS.    Objective:   BP (!) 185/84   Pulse 77   Temp 98.1 F (36.7 C)   Resp 19   Ht '6\' 2"'$  (1.88 m)   Wt (!) 401 lb 3.8 oz (182 kg)   SpO2 99%   BMI 51.52 kg/m   Intake/Output Summary (Last 24 hours) at 09/13/16 0742 Last data filed at 09/13/16 0700  Gross per 24 hour  Intake          5238.48 ml  Output             9159 ml  Net         -3920.52 ml   Weight change: -17 lb 10.2 oz (-8 kg)  Physical Exam: Gen: awake and follows command. Being weaned to PS this  morning CVS:RRR Resp:on PS Abd: obese and soft.  Ext: trace nonpitting edema, warm to touch  Imaging: US Renal  Result Date: 09/12/2016 CLINICAL DATA:  26 year old male with acute renal failure. EXAM: RENAL / URINARY TRACT ULTRASOUND COMPLETE COMPARISON:  None. FINDINGS: Evaluation is limited due to patient's body habitus. Right Kidney: Length: 12.3 cm. The right kidney is echogenic. No hydronephrosis or shadowing stone. Left Kidney: Length: 10.9 cm. The left kidney is echogenic. No hydronephrosis or shadowing stone. Bladder: The urinary bladder is collapsed and not well visualized. IMPRESSION: Echogenic kidneys consistent with medical renal disease. Correlation with clinical exam and renal function recommended. Electronically Signed   By: Anner Crete M.D.   On: 09/12/2016 05:01   Dg Chest Port 1 View  Result Date: 09/11/2016 CLINICAL DATA:  New central line placement. EXAM: PORTABLE CHEST 1 VIEW COMPARISON:  09/11/2016 at 8:21 a.m. FINDINGS: New left internal jugular central venous line has its tip projecting in the left brachiocephalic vein. It does not enter the superior vena cava. Endotracheal tube, right internal jugular dual-lumen central venous line and nasogastric tube are stable in well positioned. There has been continued improvement in pulmonary edema, which  is now mostly resolved. Cardiac enlargement is stable. No pneumothorax. IMPRESSION: 1. New left internal jugular central venous line catheter tip projects in the left brachiocephalic vein. No pneumothorax. 2. Continued improvement in lung aeration with near complete resolution of the pulmonary edema. 3. Previously described support apparatus is stable and well positioned. Electronically Signed   By: Lajean Manes M.D.   On: 09/11/2016 15:19   Dg Chest Portable 1 View  Result Date: 09/11/2016 CLINICAL DATA:  ET tube placementCentral line placement EXAM: PORTABLE CHEST 1 VIEW COMPARISON:  09/11/2016 at 5:43 a.m. FINDINGS: New  endotracheal tube tip projects 4.2 cm above the chronic. Nasal/ orogastric tube passes below the diaphragm well into the stomach and below the included field of view. Dual-lumen right internal jugular central venous line tip projects in the lower superior vena cava approximately 2 cm above the caval atrial junction. Vascular congestion and bilateral airspace lung opacities have improved consistent with improved pulmonary edema. No pneumothorax. IMPRESSION: 1. Improved pulmonary edema. 2. Support apparatus well positioned as detailed above. No pneumothorax. Electronically Signed   By: Lajean Manes M.D.   On: 09/11/2016 08:31    Labs: BMET  Recent Labs Lab 09/11/16 0548 09/11/16 2025 09/11/16 0654 09/11/16 0819 09/12/16 0414 09/12/16 1144 09/12/16 1600 09/13/16 0405  NA 137 138 134* 138 138  --  139 136  K 6.1* 6.1* 6.9* 6.8* 3.9  --  4.4 4.7  CL 108 110 115* 111 106  --  103 101  CO2 12*  --   --  12* 18*  --  22 23  GLUCOSE 166* 161* 91 97 104*  --  87 93  BUN 129* 122* 117* 130* 86*  --  65* 54*  CREATININE 13.49* 15.40* 15.30* 13.50* 9.17*  --  7.57* 6.07*  CALCIUM 4.4*  --   --  5.4* 5.6*  --  5.7* 6.1*  PHOS  --   --   --  10.8* 6.6* 6.7* 6.1* 5.7*   CBC  Recent Labs Lab 09/11/16 0548  09/12/16 0413 09/12/16 1230 09/12/16 1726 09/13/16 0415  WBC 14.6*  --  7.7 8.6 9.2 9.5  NEUTROABS 12.8*  --   --   --   --   --   HGB 7.0*  < > 5.6* 6.5* 7.9* 8.2*  HCT 21.7*  < > 16.0* 19.6* 23.0* 25.0*  MCV 72.6*  --  70.8* 73.7* 75.2* 75.5*  PLT 273  --  216 187 203 222  < > = values in this interval not displayed.  Medications:    . amLODipine  10 mg Oral Daily  . artificial tears  1 application Both Eyes K2H  . chlorhexidine gluconate (MEDLINE KIT)  15 mL Mouth Rinse BID  . Chlorhexidine Gluconate Cloth  6 each Topical Daily  . doxercalciferol  4 mcg Intravenous Once  . feeding supplement (PRO-STAT SUGAR FREE 64)  30 mL Per Tube QID  . feeding supplement (VITAL HIGH  PROTEIN)  1,000 mL Per Tube Q24H  . insulin aspart  1-3 Units Subcutaneous Q4H  . mouth rinse  15 mL Mouth Rinse QID  . pantoprazole (PROTONIX) IV  40 mg Intravenous QHS  . sodium chloride flush  10-40 mL Intracatheter Q12H    Wendee Beavers, MD.  PGY-2 Pager [phone] 09/13/16  7:42 AM  I have seen and examined this patient and agree with plan and assessment in the above note with renal recommendations/intervention highlighted. Additions added in red. Governor Rooks Jeanise Durfey,MD 09/13/2016 10:29  AM   

## 2016-09-13 NOTE — Progress Notes (Signed)
CRITICAL VALUE ALERT  Critical value received:  Calcium 6.4  Date of notification:  09/13/2016  Time of notification:  17:40  Critical value read back:Yes.    Nurse who received alert:  Fayrene Helper  MD notified (1st page):  Dr. Oletta Darter  Time of first page:  17:50  MD notified (2nd page):  Time of second page:  Responding MD:  Dr. Oletta Darter  Time MD responded:  17:53

## 2016-09-13 NOTE — Progress Notes (Signed)
[facility] Progress Note Patient Name: Linas Stepter DOB: 04-01-1991 MRN: [id number]   Date of Service  09/13/2016  HPI/Events of Note  Ca++ = 6.4 and Albumin = 2.6. Ca++ corrected for Albumin = 7.52.  eICU Interventions  Will replace Ca++.     Intervention Category Major Interventions: Electrolyte abnormality - evaluation and management  Sommer,Steven Eugene 09/13/2016, 6:09 PM

## 2016-09-13 NOTE — Progress Notes (Signed)
Critical calcium of 6.1 reported to Hulen Luster, MD.

## 2016-09-13 NOTE — Progress Notes (Signed)
PULMONARY  / CRITICAL CARE MEDICINE  Name: Paul [name] MRN: [id number] DOB: Nov 09, 1990    LOS: 2  REFERRING MD :  ED  CHIEF COMPLAINT:  "Cant breathe "   HISTORY OF PRESENT ILLNESS:   26 yo male with DM , HTN , Hyperlipidemia , Renal Insufficiency and Morbid obesity presented to ER via EMS emergently with confusion and respiratory distress . On arrival found to have BS at 91. Given D50 by EMS . Pt w/ hypoxia,  tachypnea , temp 94. and in resp distress placed on BIPAP support. Says has had cold/bronchitic symptoms for last 2 weeks with cough/congestion . Seen by PCP 1 week ago, given Zpack with no improvement and progression of sob with lower extremity swelling. Labs returned showed severe acidosis with pH at 7.118, PCO2 43, PO 44, HCO3 14. Hbg 6.1, K 6.9 , Scr 15  . CXR showed diffuse interstitial marking ? puml edema . Lactic acid 0.45 . Troponin neg.  D Dimer 17. Pt was started on Vanc and Zosyn . b/p elevated on arrival , w/ no hypotension. In ER he was given lasix 9m x 1 , solumedrol x 1  . Given D50 x 2 , started on HCO3 drip , calcium given and albuterol given . On arrival to ER , pt in severe resp distress on BIPAP . Still alert but very agitated with increased wob, accessory use and tachypnea . Pt will require emergent intubation and Hemodialysis . Mother and Sister at bedside with update given .    INTERVAL HISTORY: He received total pRBC x 3 yesterday. No overt sign of blood loss. He is alert this morning and following commands.  PAST MEDICAL HISTORY :  Past Medical History:  Diagnosis Date  . Diabetes mellitus   . Hypertension   . Thyroid disease    History reviewed. No pertinent surgical history. Prior to Admission medications   Medication Sig Start Date End Date Taking? Authorizing Provider  amLODipine (NORVASC) 10 MG tablet Take 1 tablet (10 mg total) by mouth daily. 11/28/14  Yes RMariel Aloe MD  insulin glargine (LANTUS) 100 UNIT/ML injection Inject 50 u of insulin  into the skin BID Patient taking differently: Inject 50 Units into the skin 2 (two) times daily. Inject 50 u of insulin into the skin BID 05/30/16  Yes DJanne Napoleon NP  levothyroxine (SYNTHROID, LEVOTHROID) 150 MCG tablet Take 1 tablet (150 mcg total) by mouth daily. Needs office visit 01/01/16  Yes Ashly MWindell Moulding DO  rosuvastatin (CRESTOR) 20 MG tablet Take 1 tablet (20 mg total) by mouth daily. 11/28/14  Yes RMariel Aloe MD  ACCU-CHEK FASTCLIX LANCETS MISC 1 each by Does not apply route 4 (four) times daily -  before meals and at bedtime. 03/21/14   RMariel Aloe MD  Blood Glucose Monitoring Suppl (ACCU-CHEK NANO SMARTVIEW) W/DEVICE KIT 1 Device by Does not apply route 4 (four) times daily -  before meals and at bedtime. 03/21/14   RMariel Aloe MD  ciprofloxacin (CIPRO) 500 MG tablet Take 1 tablet (500 mg total) by mouth every 12 (twelve) hours. Patient not taking: Reported on 09/11/2016 04/21/16   LFransico Meadow PA-C  glucose blood test strip Use as instructed 03/21/14   RMariel Aloe MD   No Known Allergies  FAMILY HISTORY:  Family History  Problem Relation Age of Onset  . Heart disease Mother   . Hypertension Mother    SOCIAL HISTORY:  reports that he has never smoked.  He has never used smokeless tobacco. He reports that he does not drink alcohol or use drugs.  VITAL SIGNS: Temp:  [96.6 F (35.9 C)-98.8 F (37.1 C)] 98.1 F (36.7 C) (04/24 0700) Pulse Rate:  [65-85] 85 (04/24 0754) Resp:  [17-24] 24 (04/24 0754) BP: (115-192)/(59-90) 163/90 (04/24 0754) SpO2:  [98 %-100 %] 100 % (04/24 0754) Arterial Line BP: (140-183)/(66-82) 156/72 (04/24 0700) FiO2 (%):  [40 %] 40 % (04/24 0810) Weight:  [182 kg (401 lb 3.8 oz)] 182 kg (401 lb 3.8 oz) (04/24 0106) HEMODYNAMICS: CVP:  [12 mmHg-24 mmHg] 23 mmHg VENTILATOR SETTINGS: Vent Mode: PRVC FiO2 (%):  [40 %] 40 % Set Rate:  [18 bmp] 18 bmp Vt Set:  [650 mL] 650 mL PEEP:  [8 cmH20] 8 cmH20 Pressure Support:  [12 cmH20] 12  cmH20 Plateau Pressure:  [30 cmH20-36 cmH20] 36 cmH20 INTAKE / OUTPUT: Intake/Output      04/23 0701 - 04/24 0700 04/24 0701 - 04/25 0700   I.V. (mL/kg) 3801.1 (20.9)    Blood 982.9    NG/GT 294.5    IV Piggyback 160    Total Intake(mL/kg) 5238.5 (28.8)    Urine (mL/kg/hr) 265 (0.1)    Emesis/NG output 150 (0)    Other 8744 (2)    Total Output 9159     Net -3920.5            PHYSICAL EXAMINATION: Physical Exam  Constitutional: No distress.  HENT:  Head: Normocephalic and atraumatic.  ETT in place  Eyes: Conjunctivae are normal. No scleral icterus.  Neck:  L IJ cath, R HD cath in place  Cardiovascular: Normal rate and regular rhythm.   Pulmonary/Chest: Effort normal. No respiratory distress.  Neurological:  Wiggles toes, opens/closes eyes on command.  Skin: He is not diaphoretic.      LABS: Cbc  Recent Labs Lab 09/12/16 1230 09/12/16 1726 09/13/16 0415  WBC 8.6 9.2 9.5  HGB 6.5* 7.9* 8.2*  HCT 19.6* 23.0* 25.0*  PLT 187 203 222    Chemistry   Recent Labs Lab 09/12/16 0413 09/12/16 0414 09/12/16 1144 09/12/16 1600 09/13/16 0405 09/13/16 0415  NA  --  138  --  139 136  --   K  --  3.9  --  4.4 4.7  --   CL  --  106  --  103 101  --   CO2  --  18*  --  22 23  --   BUN  --  86*  --  65* 54*  --   CREATININE  --  9.17*  --  7.57* 6.07*  --   CALCIUM  --  5.6*  --  5.7* 6.1*  --   MG 1.9  --  2.0  --   --  2.1  PHOS  --  6.6* 6.7* 6.1* 5.7*  --   GLUCOSE  --  104*  --  87 93  --     Liver fxn  Recent Labs Lab 09/11/16 0819 09/12/16 0414 09/12/16 1600 09/13/16 0405  AST 43*  --   --   --   ALT 33  --   --   --   ALKPHOS 82  --   --   --   BILITOT 0.7  --   --   --   PROT 6.7  --   --   --   ALBUMIN 2.6* 2.2* 2.3* 2.5*   coags  Recent Labs Lab 09/11/16 0819 09/12/16 0413 09/13/16  0415  APTT 35 32 28  INR 1.44  --   --    Sepsis markers  Recent Labs Lab 09/11/16 0632 09/11/16 0819 09/11/16 0823 09/11/16 1534  LATICACIDVEN  0.45*  --  0.8 0.6  PROCALCITON  --  0.13  --   --    Cardiac markers  Recent Labs Lab 09/11/16 0819  TROPONINI <0.03   ABG  Recent Labs Lab 09/12/16 0130 09/12/16 0534  09/12/16 1752 09/12/16 2002 09/13/16 0350  PHART 7.469* 7.540*  < > 7.384 7.406 7.360  PCO2ART 21.1* 19.7*  < > 39.1 37.2 43.0  PO2ART 88.0 99.0  < > 89.5 86.0 75.3*  HCO3 15.5* 17.0*  < > 22.9 23.4 23.7  TCO2 16 18  --   --  25  --   < > = values in this interval not displayed.  CBG trend  Recent Labs Lab 09/12/16 1523 09/12/16 2004 09/12/16 2331 09/13/16 0318 09/13/16 0727  GLUCAP 94 109* 100* 97 85    LINES / TUBES: 4/22 ETT >> 4/22 HD cath >> 4/22 L IJ >>  CULTURES: 4/22 BC x 2 >> 4/22 Sputum CX >> 4/22 Viral panel negative  ANTIBIOTICS: 4/22 Levaquin >> 4/22 Rocephin >> 4/23  SIGNIFICANT EVENTS:  4/23: Intubated and dialyzed.  STUDIES:  Echo 4/22 EF 60-65%, moderate-sized pericardial effusion Renal US 4/23: Medical renal disease. CXR 4/23: Improved edema. Low lung volumes.    DIAGNOSES: Active Problems:   Respiratory failure, acute (HCC)   Pressure injury of skin   Acute renal failure (HCC)   Metabolic acidosis   Acute pulmonary edema (HCC)   ASSESSMENT / PLAN:  PULMONARY A: Acute hypoxic respiratory failure with pulmonary edema w/ BIPAP failure   P:   Continue vent support today plan to wean today   CARDIOVASCULAR A:  HTN: BP trending high this AM. Moderate-sized pericardial effusion  P:  Continue home amlodipine 10 mg Increase hydralazine to 10 mg every 6 hours as needed for BP >180/110   RENAL A:   Acute Renal Failure: s/p CRRT x 2 days with plan for intermittent HD today.  Metabolic Acidosis: Resolved.   P:   Follow BMET Nephrology following, appreciate recommendations   GASTROINTESTINAL A: No acute issues.  P:   PPI  Continue tube feeds   HEMATOLOGIC A:   Acute microcytic anemia: Anemia of chronic disease per iron studies  yesterday. Unclear if CBC were aberrant yesterday as Hb now 8.3.  P:  Follow-up hemoglobinopathy eval   INFECTIOUS A: Sepsis: PCT 0.13. CXR appears improved today w/o suggestion of infiltrate.  P:   D/c Levaquin today    ENDOCRINE A:   DM  Hypoglycemia  Hyperparathyroidism: PTH 159   P:   SSI Check Vitamin D   NEUROLOGIC A:   Acute encephalopathy: Sedation needs adequate once Nimbex dc'd.  P:   RASS goal: -1 to -2  Versed, propofol, and fentanyl gtt   Charlott Rakes, PGY3 Internal Medicine Pager: [phone] 09/13/2016, 8:16 AM

## 2016-09-13 NOTE — Progress Notes (Signed)
Upon initial assessment, patient using accessory muscles to breathe. Molt, MD notified and came by to assess patient. Per MD will begin increasing sedation. Propofol increased to 60 and Fentanyl increased to 261mcg

## 2016-09-13 NOTE — Care Management Note (Addendum)
Case Management Note  Patient Details  Name: Paul [name] MRN: [id number] Date of Birth: 03-27-1991  Subjective/Objective:    Pt admitted with  Insufficiency and Morbid obesity presented to ER via EMS emergently with confusion and respiratory distress                  Action/Plan:  Pt is intubated and on CRRT.  Mom and sister at bedside.  CM will continue to assess for discharge needs   Expected Discharge Date:                  Expected Discharge Plan:     In-House Referral:  Clinical Social Work  Discharge planning Services  CM Consult  Post Acute Care Choice:    Choice offered to:     DME Arranged:    DME Agency:     HH Arranged:    [place] Agency:     Status of Service:     If discussed at [employer], dates discussed:    Additional Comments:  Maryclare Labrador, RN 09/13/2016, 11:28 AM

## 2016-09-13 NOTE — Procedures (Signed)
I was present at this CRRT session. I have reviewed the session itself and made appropriate changes.   Filed Weights   09/11/16 0528 09/12/16 0400 09/13/16 0106  Weight: (!) 181.4 kg (400 lb) (!) 190 kg (418 lb 14 oz) (!) 182 kg (401 lb 3.8 oz)     Recent Labs Lab 09/13/16 0405  NA 136  K 4.7  CL 101  CO2 23  GLUCOSE 93  BUN 54*  CREATININE 6.07*  CALCIUM 6.1*  PHOS 5.7*     Recent Labs Lab 09/11/16 0548  09/12/16 1230 09/12/16 1726 09/13/16 0415  WBC 14.6*  < > 8.6 9.2 9.5  NEUTROABS 12.8*  --   --   --   --   HGB 7.0*  < > 6.5* 7.9* 8.2*  HCT 21.7*  < > 19.6* 23.0* 25.0*  MCV 72.6*  < > 73.7* 75.2* 75.5*  PLT 273  < > 187 203 222  < > = values in this interval not displayed.  Scheduled Meds: . amLODipine  10 mg Oral Daily  . chlorhexidine gluconate (MEDLINE KIT)  15 mL Mouth Rinse BID  . Chlorhexidine Gluconate Cloth  6 each Topical Daily  . doxercalciferol  4 mcg Intravenous Once  . feeding supplement (PRO-STAT SUGAR FREE 64)  30 mL Per Tube QID  . feeding supplement (VITAL HIGH PROTEIN)  1,000 mL Per Tube Q24H  . hydrALAZINE  10 mg Intravenous Q6H  . insulin aspart  1-3 Units Subcutaneous Q4H  . mouth rinse  15 mL Mouth Rinse QID  . pantoprazole (PROTONIX) IV  40 mg Intravenous QHS  . sodium chloride flush  10-40 mL Intracatheter Q12H   Continuous Infusions: . sodium chloride    . sodium chloride    . sodium chloride Stopped (09/12/16 0527)  . dextrose    . fentaNYL infusion INTRAVENOUS 400 mcg/hr (09/13/16 0830)  . levofloxacin (LEVAQUIN) IV Stopped (09/12/16 1503)  . midazolam (VERSED) infusion Stopped (09/12/16 1145)  . dialysis replacement fluid (prismasate) 400 mL/hr at 09/13/16 0612  . dialysis replacement fluid (prismasate) 200 mL/hr at 09/12/16 1652  . dialysate (PRISMASATE) 2,000 mL/hr at 09/13/16 1000  . propofol (DIPRIVAN) infusion 70 mcg/kg/min (09/13/16 1128)  . sodium chloride     PRN Meds:.sodium chloride, Place/Maintain arterial line  **AND** sodium chloride, acetaminophen, albuterol, alteplase, dextrose, docusate, fentaNYL, heparin, labetalol, ondansetron (ZOFRAN) IV, sodium chloride   Donetta Potts,  MD 09/13/2016, 12:00 PM

## 2016-09-14 ENCOUNTER — Inpatient Hospital Stay (HOSPITAL_COMMUNITY): Payer: Medicaid Other

## 2016-09-14 DIAGNOSIS — J9601 Acute respiratory failure with hypoxia: Secondary | ICD-10-CM

## 2016-09-14 LAB — GLUCOSE, CAPILLARY
Glucose-Capillary: 105 mg/dL — ABNORMAL HIGH (ref 65–99)
Glucose-Capillary: 111 mg/dL — ABNORMAL HIGH (ref 65–99)
Glucose-Capillary: 114 mg/dL — ABNORMAL HIGH (ref 65–99)
Glucose-Capillary: 119 mg/dL — ABNORMAL HIGH (ref 65–99)
Glucose-Capillary: 123 mg/dL — ABNORMAL HIGH (ref 65–99)
Glucose-Capillary: 144 mg/dL — ABNORMAL HIGH (ref 65–99)
Glucose-Capillary: 152 mg/dL — ABNORMAL HIGH (ref 65–99)

## 2016-09-14 LAB — MAGNESIUM: Magnesium: 2.2 mg/dL (ref 1.7–2.4)

## 2016-09-14 LAB — RENAL FUNCTION PANEL
ALBUMIN: 2.5 g/dL — AB (ref 3.5–5.0)
ANION GAP: 11 (ref 5–15)
Albumin: 2.6 g/dL — ABNORMAL LOW (ref 3.5–5.0)
Anion gap: 10 (ref 5–15)
BUN: 41 mg/dL — ABNORMAL HIGH (ref 6–20)
BUN: 33 mg/dL — ABNORMAL HIGH (ref 6–20)
CALCIUM: 6.7 mg/dL — AB (ref 8.9–10.3)
CHLORIDE: 99 mmol/L — AB (ref 101–111)
CO2: 24 mmol/L (ref 22–32)
CO2: 24 mmol/L (ref 22–32)
CREATININE: 4.87 mg/dL — AB (ref 0.61–1.24)
Calcium: 7 mg/dL — ABNORMAL LOW (ref 8.9–10.3)
Chloride: 100 mmol/L — ABNORMAL LOW (ref 101–111)
Creatinine, Ser: 4.64 mg/dL — ABNORMAL HIGH (ref 0.61–1.24)
GFR calc Af Amer: 18 mL/min — ABNORMAL LOW (ref >60)
GFR calc Af Amer: 19 mL/min — ABNORMAL LOW (ref >60)
GFR calc non Af Amer: 15 mL/min — ABNORMAL LOW (ref >60)
GFR calc non Af Amer: 16 mL/min — ABNORMAL LOW (ref >60)
GLUCOSE: 106 mg/dL — AB (ref 65–99)
GLUCOSE: 151 mg/dL — AB (ref 65–99)
PHOSPHORUS: 6.2 mg/dL — AB (ref 2.5–4.6)
POTASSIUM: 5.4 mmol/L — AB (ref 3.5–5.1)
Phosphorus: 6.6 mg/dL — ABNORMAL HIGH (ref 2.5–4.6)
Potassium: 5 mmol/L (ref 3.5–5.1)
SODIUM: 134 mmol/L — AB (ref 135–145)
Sodium: 134 mmol/L — ABNORMAL LOW (ref 135–145)

## 2016-09-14 LAB — TRIGLYCERIDES: Triglycerides: 334 mg/dL — ABNORMAL HIGH (ref <150)

## 2016-09-14 LAB — CBC
HEMATOCRIT: 28.9 % — AB (ref 39.0–52.0)
Hemoglobin: 9.3 g/dL — ABNORMAL LOW (ref 13.0–17.0)
MCH: 25.3 pg — AB (ref 26.0–34.0)
MCHC: 32.2 g/dL (ref 30.0–36.0)
MCV: 78.5 fL (ref 78.0–100.0)
Platelets: 242 10*3/uL (ref 150–400)
RBC: 3.68 MIL/uL — ABNORMAL LOW (ref 4.22–5.81)
RDW: 19.4 % — ABNORMAL HIGH (ref 11.5–15.5)
WBC: 12.5 10*3/uL — ABNORMAL HIGH (ref 4.0–10.5)

## 2016-09-14 LAB — HEMOGLOBINOPATHY EVALUATION
HGB A2 QUANT: 2.9 % (ref 1.8–3.2)
HGB A: 73.6 % — AB (ref 96.4–98.8)
HGB C: 0 %
HGB F QUANT: 0 % (ref 0.0–2.0)
Hgb S Quant: 23.5 % — ABNORMAL HIGH
Hgb Variant: 0 %

## 2016-09-14 LAB — APTT: aPTT: 30 seconds (ref 24–36)

## 2016-09-14 LAB — HEMOGLOBIN A1C
Hgb A1c MFr Bld: 5.5 % (ref 4.8–5.6)
MEAN PLASMA GLUCOSE: 111 mg/dL

## 2016-09-14 LAB — CULTURE, RESPIRATORY W GRAM STAIN: Culture: NORMAL

## 2016-09-14 LAB — CULTURE, RESPIRATORY

## 2016-09-14 LAB — GLOMERULAR BASEMENT MEMBRANE ANTIBODIES: GBM Ab: 3 U (ref 0–20)

## 2016-09-14 MED ORDER — LEVOTHYROXINE SODIUM 100 MCG IV SOLR
75.0000 ug | Freq: Every day | INTRAVENOUS | Status: DC
  Administered 2016-09-14 – 2016-09-29 (×16): 75 ug via INTRAVENOUS
  Filled 2016-09-14 (×17): qty 5

## 2016-09-14 MED ORDER — PRISMASOL BGK 0/2.5 32-2.5 MEQ/L IV SOLN
INTRAVENOUS | Status: DC
  Administered 2016-09-14 – 2016-09-15 (×3): via INTRAVENOUS_CENTRAL
  Filled 2016-09-14 (×5): qty 5000

## 2016-09-14 NOTE — Progress Notes (Signed)
[facility] Progress Note    Assessment/ Plan:   Acute and/or chronic renal failure: improving- he had stage III CKD in 2016 with creat 1.8. Serum Cr 13.5 (on admit).  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  Very low Ca could argue for chronic renal failure. C3, C4 and ASO negative. Tolerating CVVHD.  -7.2L off in the last 24 hours. He is net -10.6L since admit  On CVVHD.  +2.9L in and  -7.2L off. CVP 18 this morning. Will continue take off fluid at 300cc/hr.   Will take off more fluid. BP better this morning at  155/66  Follow up other autoimmune labs  Hypocalcemia: ?due renal failure. Ca 5.4->>6.7 (corrects to 8). PTH 156  Continue Ca-gluconate  Continue hectrol  Dialy renal function  Met acidosis: resolved. Likely due to renal failure  Na bicarb gtt and then HD later today  Acute Resp Failure: RT weaning to PS this morning. Pneumonia  On Levaquin 250 mg   Per primary  Microcytic anemia: Hgb 9.3. Anemia panel within normal  s/p 4 unit  Daily H&H  HTN uncontrolled: BP 192/90 this morning.   On CVVHD as above  On Amlodipine 10 mg daily, hydralazine 49m IV q6 and labetalol prn  DM2: last A1c  9.6 in 2016  On insulin per primary   Subjective:   Mildly sedated, intubated, follows command. On CVVHD.  He is being weaned to PS.    Objective:   BP (!) 152/66   Pulse 92   Temp 97.5 F (36.4 C)   Resp 17   Ht 6' 2"  (1.88 m)   Wt (!) 388 lb 0.2 oz (176 kg)   SpO2 100%   BMI 49.82 kg/m   Intake/Output Summary (Last 24 hours) at 09/14/16 0804 Last data filed at 09/14/16 0800  Gross per 24 hour  Intake          2919.97 ml  Output             7227 ml  Net         -4307.03 ml   Weight change: -13 lb 3.7 oz (-6 kg)  Physical Exam: Gen: awake and follows command. Being weaned to PS this morning CVS:RRR Resp:on PS Abd: obese and soft.  Ext: no edema, warm to touch  Imaging: Dg Chest Port 1 View  Result  Date: 09/14/2016 CLINICAL DATA:  Respiratory failure. EXAM: PORTABLE CHEST 1 VIEW COMPARISON:  09/13/2016. FINDINGS: Endotracheal tube, NG tube, bilateral IJ lines in stable position. Persistent severe cardiomegaly. Low lung volumes. Persistent bibasilar infiltrates and/or edema again noted. Small bilateral pleural effusions can't be excluded. IMPRESSION: 1. Lines and tubes in stable position. 2. Persistent severe cardiomegaly. Persistent bibasilar pulmonary infiltrates/edema again noted without change. Small bilateral pleural effusions cannot be excluded. 3. Low lung volumes. Electronically Signed   By: TMarcello Moores Register   On: 09/14/2016 06:46   Dg Chest Port 1 View  Result Date: 09/13/2016 CLINICAL DATA:  Acute respiratory failure. EXAM: PORTABLE CHEST 1 VIEW COMPARISON:  09/11/2016 FINDINGS: Endotracheal tube tip projects 4.4 cm above the carina. Right internal jugular dual-lumen central venous catheter has its tip in the lower superior vena cava left internal jugular central venous line has its tip in the central left brachiocephalic vein. Nasal/orogastric tube passes at least the distal esophagus, likely into the stomach by below the included field of view. Support apparatus is stable. Cardiac silhouette is mildly enlarged. No mediastinal or hilar masses.  There is opacity at the lung bases increased from prior study. This is likely atelectasis. Pneumonia is possible. Asymmetric pulmonary edema is possible. IMPRESSION: 1. Support apparatus is stable and positioned as detailed above. 2. Increased opacity at the lung bases when compared to the most recent prior exam. This is most likely atelectasis. Pneumonia or asymmetric edema are possible but thought less likely. Electronically Signed   By: Lajean Manes M.D.   On: 09/13/2016 10:14    Labs: BMET  Recent Labs Lab 09/11/16 0548  09/11/16 0654 09/11/16 0819 09/12/16 0414 09/12/16 1144 09/12/16 1600 09/13/16 0405 09/13/16 1513 09/14/16 0307  NA  137  < > 134* 138 138  --  139 136 135 134*  K 6.1*  < > 6.9* 6.8* 3.9  --  4.4 4.7 4.9 5.0  CL 108  < > 115* 111 106  --  103 101 102 100*  CO2 12*  --   --  12* 18*  --  22 23 23 24   GLUCOSE 166*  < > 91 97 104*  --  87 93 106* 106*  BUN 129*  < > 117* 130* 86*  --  65* 54* 46* 41*  CREATININE 13.49*  < > 15.30* 13.50* 9.17*  --  7.57* 6.07* 5.41* 4.87*  CALCIUM 4.4*  --   --  5.4* 5.6*  --  5.7* 6.1* 6.4* 6.7*  PHOS  --   --   --  10.8* 6.6* 6.7* 6.1* 5.7* 5.7* 6.6*  < > = values in this interval not displayed. CBC  Recent Labs Lab 09/11/16 0548  09/12/16 1230 09/12/16 1726 09/13/16 0415 09/14/16 0307  WBC 14.6*  < > 8.6 9.2 9.5 12.5*  NEUTROABS 12.8*  --   --   --   --   --   HGB 7.0*  < > 6.5* 7.9* 8.2* 9.3*  HCT 21.7*  < > 19.6* 23.0* 25.0* 28.9*  MCV 72.6*  < > 73.7* 75.2* 75.5* 78.5  PLT 273  < > 187 203 222 242  < > = values in this interval not displayed.  Medications:    . amLODipine  10 mg Oral Daily  . chlorhexidine gluconate (MEDLINE KIT)  15 mL Mouth Rinse BID  . Chlorhexidine Gluconate Cloth  6 each Topical Daily  . doxercalciferol  4 mcg Intravenous Once  . feeding supplement (PRO-STAT SUGAR FREE 64)  30 mL Per Tube QID  . feeding supplement (VITAL HIGH PROTEIN)  1,000 mL Per Tube Q24H  . hydrALAZINE  10 mg Intravenous Q6H  . insulin aspart  1-3 Units Subcutaneous Q4H  . mouth rinse  15 mL Mouth Rinse QID  . pantoprazole (PROTONIX) IV  40 mg Intravenous QHS  . sodium chloride flush  10-40 mL Intracatheter Q12H    Wendee Beavers, MD.  PGY-2 Pager [phone] 09/14/16  8:04 AM  I have seen and examined this patient and agree with plan and assessment in the above note with renal recommendations/intervention highlighted.  As IVF input decreases, we should be able to transition to IHD, however still getting 3 liters/day which would mean UF in excess of 6liters with IHD.  Potassium up, will change pre-filter replacement solution to 0K/2.5 Ca and follow.    Governor Rooks Gurman [place],MD 09/14/2016 2:44 PM

## 2016-09-14 NOTE — Progress Notes (Signed)
PULMONARY  / CRITICAL CARE MEDICINE  Name: Dagen Beevers MRN: [id number] DOB: 1990/08/10    LOS: 19  REFERRING MD :  ED  CHIEF COMPLAINT:  "Cant breathe "   HISTORY OF PRESENT ILLNESS:   26 yo male with DM , HTN , Hyperlipidemia , Renal Insufficiency and Morbid obesity presented to ER via EMS emergently with confusion and respiratory distress . On arrival found to have BS at 30. Given D50 by EMS . Pt w/ hypoxia,  tachypnea , temp 94. and in resp distress placed on BIPAP support. Says has had cold/bronchitic symptoms for last 2 weeks with cough/congestion . Seen by PCP 1 week ago, given Zpack with no improvement and progression of sob with lower extremity swelling. Labs returned showed severe acidosis with pH at 7.118, PCO2 43, PO 44, HCO3 14. Hbg 6.1, K 6.9 , Scr 15  . CXR showed diffuse interstitial marking ? puml edema . Lactic acid 0.45 . Troponin neg.  D Dimer 17. Pt was started on Vanc and Zosyn . b/p elevated on arrival , w/ no hypotension. In ER he was given lasix 78m x 1 , solumedrol x 1  . Given D50 x 2 , started on HCO3 drip , calcium given and albuterol given . On arrival to ER , pt in severe resp distress on BIPAP . Still alert but very agitated with increased wob, accessory use and tachypnea . Pt will require emergent intubation and Hemodialysis . Mother and Sister at bedside with update given .    INTERVAL HISTORY: He required increased sedation yesterday for vent dyssynchrony.  PAST MEDICAL HISTORY :  Past Medical History:  Diagnosis Date  . Diabetes mellitus   . Hypertension   . Thyroid disease    History reviewed. No pertinent surgical history. Prior to Admission medications   Medication Sig Start Date End Date Taking? Authorizing Provider  amLODipine (NORVASC) 10 MG tablet Take 1 tablet (10 mg total) by mouth daily. 11/28/14  Yes RMariel Aloe MD  insulin glargine (LANTUS) 100 UNIT/ML injection Inject 50 u of insulin into the skin BID Patient taking differently: Inject  50 Units into the skin 2 (two) times daily. Inject 50 u of insulin into the skin BID 05/30/16  Yes DJanne Napoleon NP  levothyroxine (SYNTHROID, LEVOTHROID) 150 MCG tablet Take 1 tablet (150 mcg total) by mouth daily. Needs office visit 01/01/16  Yes Ashly MWindell Moulding DO  rosuvastatin (CRESTOR) 20 MG tablet Take 1 tablet (20 mg total) by mouth daily. 11/28/14  Yes RMariel Aloe MD  ACCU-CHEK FASTCLIX LANCETS MISC 1 each by Does not apply route 4 (four) times daily -  before meals and at bedtime. 03/21/14   RMariel Aloe MD  Blood Glucose Monitoring Suppl (ACCU-CHEK NANO SMARTVIEW) W/DEVICE KIT 1 Device by Does not apply route 4 (four) times daily -  before meals and at bedtime. 03/21/14   RMariel Aloe MD  ciprofloxacin (CIPRO) 500 MG tablet Take 1 tablet (500 mg total) by mouth every 12 (twelve) hours. Patient not taking: Reported on 09/11/2016 04/21/16   LFransico Meadow PA-C  glucose blood test strip Use as instructed 03/21/14   RMariel Aloe MD   No Known Allergies  FAMILY HISTORY:  Family History  Problem Relation Age of Onset  . Heart disease Mother   . Hypertension Mother    SOCIAL HISTORY:  reports that he has never smoked. He has never used smokeless tobacco. He reports that he does not drink  alcohol or use drugs.  VITAL SIGNS: Temp:  [97.5 F (36.4 C)-99.3 F (37.4 C)] 97.5 F (36.4 C) (04/25 0800) Pulse Rate:  [84-103] 92 (04/25 0800) Resp:  [16-27] 17 (04/25 0800) BP: (100-197)/(41-86) 152/66 (04/25 0800) SpO2:  [92 %-100 %] 100 % (04/25 0800) Arterial Line BP: (118-186)/(47-82) 159/70 (04/25 0800) FiO2 (%):  [40 %] 40 % (04/25 0800) Weight:  [176 kg (388 lb 0.2 oz)] 176 kg (388 lb 0.2 oz) (04/25 0416) HEMODYNAMICS:   VENTILATOR SETTINGS: Vent Mode: PRVC FiO2 (%):  [40 %] 40 % Set Rate:  [18 bmp] 18 bmp Vt Set:  [650 mL] 650 mL PEEP:  [5 cmH20-8 cmH20] 5 cmH20 Plateau Pressure:  [28 cmH20-38 cmH20] 30 cmH20 INTAKE / OUTPUT: Intake/Output      04/24 0701 - 04/25  0700 04/25 0701 - 04/26 0700   I.V. (mL/kg) 2266.3 (12.9) 95.3 (0.5)   Blood     NG/GT 450 10   IV Piggyback 170    Total Intake(mL/kg) 2886.3 (16.4) 105.3 (0.6)   Urine (mL/kg/hr) 147 (0)    Emesis/NG output     Other 7080 (1.7) 310 (1.6)   Total Output 7227 310   Net -4340.7 -204.7          PHYSICAL EXAMINATION: Physical Exam  Constitutional: No distress.  HENT:  Head: Normocephalic and atraumatic.  ETT in place  Eyes: Conjunctivae are normal. No scleral icterus.  Neck:  L IJ cath, R HD cath in place  Cardiovascular: Normal rate and regular rhythm.   Pulmonary/Chest: Effort normal. No respiratory distress.  Dyssynchrony with vent  Neurological:  Wiggles toes, opens/closes eyes on command.  Skin: He is not diaphoretic.      LABS: Cbc  Recent Labs Lab 09/12/16 1726 09/13/16 0415 09/14/16 0307  WBC 9.2 9.5 12.5*  HGB 7.9* 8.2* 9.3*  HCT 23.0* 25.0* 28.9*  PLT 203 222 242    Chemistry   Recent Labs Lab 09/12/16 1144  09/13/16 0405 09/13/16 0415 09/13/16 1513 09/14/16 0307  NA  --   < > 136  --  135 134*  K  --   < > 4.7  --  4.9 5.0  CL  --   < > 101  --  102 100*  CO2  --   < > 23  --  23 24  BUN  --   < > 54*  --  46* 41*  CREATININE  --   < > 6.07*  --  5.41* 4.87*  CALCIUM  --   < > 6.1*  --  6.4* 6.7*  MG 2.0  --   --  2.1  --  2.2  PHOS 6.7*  < > 5.7*  --  5.7* 6.6*  GLUCOSE  --   < > 93  --  106* 106*  < > = values in this interval not displayed.  Liver fxn  Recent Labs Lab 09/11/16 0819  09/13/16 0405 09/13/16 1513 09/14/16 0307  AST 43*  --   --   --   --   ALT 33  --   --   --   --   ALKPHOS 82  --   --   --   --   BILITOT 0.7  --   --   --   --   PROT 6.7  --   --   --   --   ALBUMIN 2.6*  < > 2.5* 2.6* 2.5*  < > = values in  this interval not displayed. coags  Recent Labs Lab 09/11/16 0819 09/12/16 0413 09/13/16 0415 09/14/16 0307  APTT 35 32 28 30  INR 1.44  --   --   --    Sepsis markers  Recent Labs Lab  09/11/16 0632 09/11/16 0819 09/11/16 0823 09/11/16 1534 09/13/16 1513  LATICACIDVEN 0.45*  --  0.8 0.6  --   PROCALCITON  --  0.13  --   --  0.32   Cardiac markers  Recent Labs Lab 09/11/16 0819  TROPONINI <0.03   ABG  Recent Labs Lab 09/12/16 0130 09/12/16 0534  09/12/16 2002 09/13/16 0350 09/13/16 0935  PHART 7.469* 7.540*  < > 7.406 7.360 7.363  PCO2ART 21.1* 19.7*  < > 37.2 43.0 41.5  PO2ART 88.0 99.0  < > 86.0 75.3* 72.1*  HCO3 15.5* 17.0*  < > 23.4 23.7 23.0  TCO2 16 18  --  25  --   --   < > = values in this interval not displayed.  CBG trend  Recent Labs Lab 09/13/16 1146 09/13/16 1522 09/13/16 2010 09/14/16 0007 09/14/16 0409  GLUCAP 110* 106* 138* 111* 105*    LINES / TUBES: 4/22 ETT >> 4/22 HD cath >> 4/22 L IJ >>  CULTURES: 4/22 BC x 2 >> 4/22 Sputum CX >> 4/22 Viral panel negative  ANTIBIOTICS: 4/22 Levaquin >> 4/22 Rocephin >> 4/23  SIGNIFICANT EVENTS:  4/23: Intubated and dialyzed.  STUDIES:  Echo 4/22 EF 60-65%, moderate-sized pericardial effusion Renal US 4/23: Medical renal disease. CXR 4/23: Improved edema. Low lung volumes.    DIAGNOSES: Active Problems:   Respiratory failure, acute (HCC)   Pressure injury of skin   Acute renal failure (HCC)   Metabolic acidosis   Acute pulmonary edema (HCC)   ASSESSMENT / PLAN:  PULMONARY A: Acute hypoxic respiratory failure with pulmonary edema w/ BIPAP failure: CXR with improving edema.  P:   Continue vent support today    CARDIOVASCULAR A:  HTN: BP improved. Moderate-sized pericardial effusion  P:  Continue home amlodipine 10 mg and hydralazine 60 mg every 6 hours   RENAL A:   Acute Renal Failure: s/p CRRT x 3 days with plan for intermittent HD today. Net -4.3L yesterday and -10.4L since admission. Metabolic Acidosis: Resolved.   P:   Follow BMET Nephrology following, appreciate recommendations   GASTROINTESTINAL A: No acute issues.  P:   PPI   Continue tube feeds   HEMATOLOGIC A:   Acute microcytic anemia: Anemia of chronic disease per iron studies on admission. Hb 9.3 this morning. P:  Follow-up hemoglobinopathy eval   INFECTIOUS A: Sepsis: Resolved.  P:   Continue reassessing.   ENDOCRINE A:   DM  Hypoglycemia  Hyperparathyroidism: PTH 159   P:   SSI Follow-up Vitamin D   NEUROLOGIC A:   Acute encephalopathy: Improving P:   RASS goal: -1 to -2  Continue propofol and fentanyl gtt   Charlott Rakes, PGY3 Internal Medicine Pager: [phone] 09/14/2016, 8:08 AM

## 2016-09-15 ENCOUNTER — Inpatient Hospital Stay (HOSPITAL_COMMUNITY): Payer: Medicaid Other

## 2016-09-15 DIAGNOSIS — R0603 Acute respiratory distress: Secondary | ICD-10-CM

## 2016-09-15 DIAGNOSIS — J9602 Acute respiratory failure with hypercapnia: Secondary | ICD-10-CM

## 2016-09-15 LAB — CBC
HCT: 29.9 % — ABNORMAL LOW (ref 39.0–52.0)
HEMOGLOBIN: 9.3 g/dL — AB (ref 13.0–17.0)
MCH: 24.9 pg — AB (ref 26.0–34.0)
MCHC: 31.1 g/dL (ref 30.0–36.0)
MCV: 80.2 fL (ref 78.0–100.0)
PLATELETS: 232 10*3/uL (ref 150–400)
RBC: 3.73 MIL/uL — AB (ref 4.22–5.81)
RDW: 19.2 % — ABNORMAL HIGH (ref 11.5–15.5)
WBC: 15.8 10*3/uL — ABNORMAL HIGH (ref 4.0–10.5)

## 2016-09-15 LAB — GLUCOSE, CAPILLARY
GLUCOSE-CAPILLARY: 103 mg/dL — AB (ref 65–99)
GLUCOSE-CAPILLARY: 140 mg/dL — AB (ref 65–99)
Glucose-Capillary: 107 mg/dL — ABNORMAL HIGH (ref 65–99)
Glucose-Capillary: 114 mg/dL — ABNORMAL HIGH (ref 65–99)
Glucose-Capillary: 121 mg/dL — ABNORMAL HIGH (ref 65–99)
Glucose-Capillary: 197 mg/dL — ABNORMAL HIGH (ref 65–99)

## 2016-09-15 LAB — RENAL FUNCTION PANEL
ALBUMIN: 2.6 g/dL — AB (ref 3.5–5.0)
ANION GAP: 11 (ref 5–15)
Albumin: 2.6 g/dL — ABNORMAL LOW (ref 3.5–5.0)
Anion gap: 12 (ref 5–15)
BUN: 32 mg/dL — ABNORMAL HIGH (ref 6–20)
BUN: 30 mg/dL — AB (ref 6–20)
CHLORIDE: 99 mmol/L — AB (ref 101–111)
CHLORIDE: 100 mmol/L — AB (ref 101–111)
CO2: 23 mmol/L (ref 22–32)
CO2: 24 mmol/L (ref 22–32)
CREATININE: 4.16 mg/dL — AB (ref 0.61–1.24)
Calcium: 6.9 mg/dL — ABNORMAL LOW (ref 8.9–10.3)
Calcium: 7.4 mg/dL — ABNORMAL LOW (ref 8.9–10.3)
Creatinine, Ser: 4.42 mg/dL — ABNORMAL HIGH (ref 0.61–1.24)
GFR, EST AFRICAN AMERICAN: 20 mL/min — AB (ref >60)
GFR, EST AFRICAN AMERICAN: 22 mL/min — AB (ref >60)
GFR, EST NON AFRICAN AMERICAN: 17 mL/min — AB (ref >60)
GFR, EST NON AFRICAN AMERICAN: 19 mL/min — AB (ref >60)
Glucose, Bld: 116 mg/dL — ABNORMAL HIGH (ref 65–99)
Glucose, Bld: 142 mg/dL — ABNORMAL HIGH (ref 65–99)
PHOSPHORUS: 5.8 mg/dL — AB (ref 2.5–4.6)
PHOSPHORUS: 7.1 mg/dL — AB (ref 2.5–4.6)
POTASSIUM: 5.1 mmol/L (ref 3.5–5.1)
Potassium: 5.5 mmol/L — ABNORMAL HIGH (ref 3.5–5.1)
SODIUM: 136 mmol/L (ref 135–145)
Sodium: 133 mmol/L — ABNORMAL LOW (ref 135–145)

## 2016-09-15 LAB — POCT I-STAT 3, ART BLOOD GAS (G3+)
ACID-BASE DEFICIT: 2 mmol/L (ref 0.0–2.0)
Acid-base deficit: 4 mmol/L — ABNORMAL HIGH (ref 0.0–2.0)
BICARBONATE: 26.4 mmol/L (ref 20.0–28.0)
BICARBONATE: 25.8 mmol/L (ref 20.0–28.0)
O2 Saturation: 88 %
O2 Saturation: 91 %
PO2 ART: 67 mmHg — AB (ref 83.0–108.0)
PO2 ART: 70 mmHg — AB (ref 83.0–108.0)
TCO2: 29 mmol/L (ref 0–100)
TCO2: 27 mmol/L (ref 0–100)
pCO2 arterial: 74.7 mmHg (ref 32.0–48.0)
pCO2 arterial: 57 mmHg — ABNORMAL HIGH (ref 32.0–48.0)
pH, Arterial: 7.15 — CL (ref 7.350–7.450)
pH, Arterial: 7.263 — ABNORMAL LOW (ref 7.350–7.450)

## 2016-09-15 LAB — MAGNESIUM: MAGNESIUM: 2.3 mg/dL (ref 1.7–2.4)

## 2016-09-15 LAB — VITAMIN D 25 HYDROXY (VIT D DEFICIENCY, FRACTURES): VIT D 25 HYDROXY: 6.8 ng/mL — AB (ref 30.0–100.0)

## 2016-09-15 LAB — TRIGLYCERIDES: TRIGLYCERIDES: 302 mg/dL — AB (ref <150)

## 2016-09-15 LAB — APTT: APTT: 28 s (ref 24–36)

## 2016-09-15 MED ORDER — SODIUM BICARBONATE 8.4 % IV SOLN
50.0000 meq | Freq: Once | INTRAVENOUS | Status: AC
  Administered 2016-09-15: 50 meq via INTRAVENOUS

## 2016-09-15 MED ORDER — ALBUTEROL SULFATE (2.5 MG/3ML) 0.083% IN NEBU
2.5000 mg | INHALATION_SOLUTION | RESPIRATORY_TRACT | Status: DC
  Administered 2016-09-15 – 2016-09-23 (×44): 2.5 mg via RESPIRATORY_TRACT
  Filled 2016-09-15 (×46): qty 3

## 2016-09-15 MED ORDER — ETOMIDATE 2 MG/ML IV SOLN
20.0000 mg | Freq: Once | INTRAVENOUS | Status: AC
  Administered 2016-09-15: 20 mg via INTRAVENOUS

## 2016-09-15 MED ORDER — ACETYLCYSTEINE 10 % IN SOLN
4.0000 mL | Freq: Two times a day (BID) | RESPIRATORY_TRACT | Status: DC
  Filled 2016-09-15: qty 4

## 2016-09-15 MED ORDER — FENTANYL CITRATE (PF) 100 MCG/2ML IJ SOLN
INTRAMUSCULAR | Status: AC
  Filled 2016-09-15: qty 4

## 2016-09-15 MED ORDER — FENTANYL CITRATE (PF) 100 MCG/2ML IJ SOLN
100.0000 ug | INTRAMUSCULAR | Status: DC | PRN
  Administered 2016-09-18 – 2016-09-21 (×7): 100 ug via INTRAVENOUS
  Filled 2016-09-15 (×9): qty 2

## 2016-09-15 MED ORDER — PROPOFOL 1000 MG/100ML IV EMUL
0.0000 ug/kg/min | INTRAVENOUS | Status: DC
  Administered 2016-09-15: 50 ug/kg/min via INTRAVENOUS
  Administered 2016-09-15: 40 ug/kg/min via INTRAVENOUS
  Administered 2016-09-15 (×2): 50 ug/kg/min via INTRAVENOUS
  Administered 2016-09-16: 40 ug/kg/min via INTRAVENOUS
  Administered 2016-09-16: 50 ug/kg/min via INTRAVENOUS
  Administered 2016-09-16: 45 ug/kg/min via INTRAVENOUS
  Administered 2016-09-16: 40 ug/kg/min via INTRAVENOUS
  Administered 2016-09-16 (×2): 50 ug/kg/min via INTRAVENOUS
  Administered 2016-09-16: 45 ug/kg/min via INTRAVENOUS
  Administered 2016-09-16: 40 ug/kg/min via INTRAVENOUS
  Administered 2016-09-16: 45 ug/kg/min via INTRAVENOUS
  Administered 2016-09-16: 40 ug/kg/min via INTRAVENOUS
  Administered 2016-09-16: 50 ug/kg/min via INTRAVENOUS
  Administered 2016-09-17: 45 ug/kg/min via INTRAVENOUS
  Administered 2016-09-17 (×3): 50 ug/kg/min via INTRAVENOUS
  Filled 2016-09-15: qty 200
  Filled 2016-09-15 (×18): qty 100

## 2016-09-15 MED ORDER — SODIUM BICARBONATE 8.4 % IV SOLN
INTRAVENOUS | Status: AC
  Administered 2016-09-15: 50 meq via INTRAVENOUS
  Filled 2016-09-15: qty 50

## 2016-09-15 MED ORDER — MIDAZOLAM HCL 2 MG/2ML IJ SOLN
2.0000 mg | Freq: Once | INTRAMUSCULAR | Status: AC
Start: 2016-09-15 — End: 2016-09-15
  Administered 2016-09-15: 2 mg via INTRAVENOUS

## 2016-09-15 MED ORDER — ROCURONIUM BROMIDE 50 MG/5ML IV SOLN
5.0000 mg | Freq: Once | INTRAVENOUS | Status: AC
  Administered 2016-09-15: 5 mg via INTRAVENOUS

## 2016-09-15 MED ORDER — EMPTY CONTAINERS FLEXIBLE MISC
100.0000 ug/h | Status: DC
  Filled 2016-09-15: qty 100

## 2016-09-15 MED ORDER — CLONIDINE HCL 0.1 MG/24HR TD PTWK
0.1000 mg | MEDICATED_PATCH | TRANSDERMAL | Status: DC
  Administered 2016-09-15: 0.1 mg via TRANSDERMAL
  Filled 2016-09-15: qty 1

## 2016-09-15 MED ORDER — FENTANYL CITRATE (PF) 100 MCG/2ML IJ SOLN: 100.0000 ug | INTRAMUSCULAR | Status: DC | PRN

## 2016-09-15 MED ORDER — MIDAZOLAM HCL 2 MG/2ML IJ SOLN: 2.0000 mg | INTRAMUSCULAR | Status: DC | PRN

## 2016-09-15 MED ORDER — FENTANYL CITRATE (PF) 100 MCG/2ML IJ SOLN
150.0000 ug | Freq: Once | INTRAMUSCULAR | Status: AC
  Administered 2016-09-15: 150 ug via INTRAVENOUS

## 2016-09-15 MED ORDER — ACETYLCYSTEINE 20 % IN SOLN
4.0000 mL | Freq: Two times a day (BID) | RESPIRATORY_TRACT | Status: DC
  Administered 2016-09-15 – 2016-09-21 (×12): 4 mL via RESPIRATORY_TRACT
  Filled 2016-09-15 (×12): qty 4

## 2016-09-15 MED ORDER — MIDAZOLAM HCL 2 MG/2ML IJ SOLN
2.0000 mg | INTRAMUSCULAR | Status: DC | PRN
  Administered 2016-09-19 – 2016-09-21 (×2): 2 mg via INTRAVENOUS
  Filled 2016-09-15 (×3): qty 2

## 2016-09-15 MED ORDER — MIDAZOLAM HCL 2 MG/2ML IJ SOLN
INTRAMUSCULAR | Status: AC
  Filled 2016-09-15: qty 4

## 2016-09-15 NOTE — Progress Notes (Signed)
Bladder scan performed: 45ml

## 2016-09-15 NOTE — Progress Notes (Addendum)
Nutrition Follow-up  DOCUMENTATION CODES:   Morbid obesity  INTERVENTION:   If remains extubated, diet advancement as able. If requires re-intubation, recommend resume TF:  Vital High Protein at 95 ml/h if not on Propofol (2280 kcal, 200 gm protein, 1906 ml free water daily)   Vital High Protein at 40 ml/h with Prostat 60 ml TID if on Propofol (1560 kcal, 174 gm protein, 803 ml free water daily)  NUTRITION DIAGNOSIS:   Inadequate oral intake related to inability to eat as evidenced by NPO status.  Ongoing  GOAL:   Provide needs based on ASPEN/SCCM guidelines  Unmet  MONITOR:   Vent status, Diet advancement, I & O's, Labs  ASSESSMENT:   26 yo male morbidly obese with HTN/DM presented with severe metabolic acidosis , acute renal failure and acute hypoxic resp failure with pulmonary edema.   Discussed patient in ICU rounds and with RN today. Patient self-extubated this morning. Now requiring BiPAP. CRRT to be stopped today when filter clots. Transitioning to IHD tomorrow. Remains NPO. Labs and medications reviewed.  Diet Order:  Diet NPO time specified  Skin:  Wound (see comment) (stage II to groin)  Last BM:  PTA  Height:   Ht Readings from Last 1 Encounters:  09/11/16 6\' 2"  (1.88 m)    Weight:   Wt Readings from Last 1 Encounters:  09/15/16 (!) 370 lb 6 oz (168 kg)    Ideal Body Weight:  86.4 kg  BMI:  Body mass index is 47.55 kg/m.  Estimated Nutritional Needs:   Kcal:  0211-1735  Protein:  175-200 gm  Fluid:  2 L  EDUCATION NEEDS:   No education needs identified at this time  Molli Barrows, [place], [place], [place] Pager [phone] After Hours Pager [phone]

## 2016-09-15 NOTE — Progress Notes (Signed)
Wasted approx 90 ml of propofol. Witnessed by lindsey flood rn

## 2016-09-15 NOTE — Progress Notes (Signed)
Pt self- extubated. I was in room at time of even at pt bagged until a NRB could be obtained. Sedation was turned off as well. Prior to extubation, pt was a RASS of 0 and was following commands. No signs of agitation. Pt had safety mitts on as well. MD Ramaswamey at bedside and asked to pplace pt on bipap  Will monitor  Lorre Munroe

## 2016-09-15 NOTE — Progress Notes (Signed)
[facility] Progress Note    Assessment/ Plan:   Acute and/or chronic renal failure: improving- he had stage III CKD in 2016 with creat 1.8. Serum Cr 13.5 (on admit).  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  Very low Ca could argue for chronic renal failure. Autoimmune labs negative except for positive ANA. Tolerating CVVHD. +2.9L in and  -9.2L off. CVP 13 this morning. He is net -17 L since admission.   Will transition to intermittent HD as IVF intake is down to 2.8  Will take off more fluid. BP elevated this morning  Follow up other autoimmune labs  Agree with the additional autoimune labs by primary for his positive ANA.   Will transition to IHD tomorrow and stop CVVHD once filter clots today (has been lasting about 12 hours)  Hypocalcemia: ?due renal failure. Ca 5.4->>6.8 (corrects to 8). PTH 156  Getting Ca with CVVHD  Continue hectrol  Dialy renal function  HyperK: resolving with 0 K UF fluid  Continue monitoring  Met acidosis: resolved. Likely due to renal failure.   Monitor with renal function panel  Acute Resp Failure: extubated himself this morning. S/p three days course of Levaquin for ?pneumonia  On BiPAP  Per primary  Microcytic anemia: s/p 4 unit. Hgb stable at  9.3. Anemia panel within normal. Iron sat 43%. Hemoglobinopathy with sickle cell trait  Daily H&H  HTN uncontrolled: elevated this morning.   On CVVHD and taking of 300 cc/hr  On Amlodipine 10 mg daily and labetalol as needed  Stop Hydralazine (as this may be source of +ANA and/or drug induced lupus)  Clonidine patch 0.1 (new to help control BP until he can take po)  DM2: last A1c  9.6 in 2016. Current A1c unreliable (after transfusion)  On insulin per primary   Subjective:   Sleepy but follows commands. On BiPAP. Mother at bedside   Objective:   BP (!) 133/54   Pulse 89   Temp 98.6 F (37 C)   Resp 19   Ht 6' 2"  (1.88 m)   Wt (!)  168 kg (370 lb 6 oz)   SpO2 100%   BMI 47.55 kg/m   Intake/Output Summary (Last 24 hours) at 09/15/16 0721 Last data filed at 09/15/16 0700  Gross per 24 hour  Intake          2827.47 ml  Output             9575 ml  Net         -6747.53 ml   Weight change: -8 kg (-17 lb 10.2 oz)  Physical Exam: Gen: sleepy but follows command CVS:RRR Resp:on BiPAP. Extubated himself this morning Abd: obese and soft.  Ext: no edema, warm to touch  Imaging: Dg Chest Port 1 View  Result Date: 09/14/2016 CLINICAL DATA:  Respiratory failure. EXAM: PORTABLE CHEST 1 VIEW COMPARISON:  09/13/2016. FINDINGS: Endotracheal tube, NG tube, bilateral IJ lines in stable position. Persistent severe cardiomegaly. Low lung volumes. Persistent bibasilar infiltrates and/or edema again noted. Small bilateral pleural effusions can't be excluded. IMPRESSION: 1. Lines and tubes in stable position. 2. Persistent severe cardiomegaly. Persistent bibasilar pulmonary infiltrates/edema again noted without change. Small bilateral pleural effusions cannot be excluded. 3. Low lung volumes. Electronically Signed   By: Marcello Moores  Register   On: 09/14/2016 06:46   Dg Chest Port 1 View  Result Date: 09/13/2016 CLINICAL DATA:  Acute respiratory failure. EXAM: PORTABLE CHEST 1 VIEW COMPARISON:  09/11/2016  FINDINGS: Endotracheal tube tip projects 4.4 cm above the carina. Right internal jugular dual-lumen central venous catheter has its tip in the lower superior vena cava left internal jugular central venous line has its tip in the central left brachiocephalic vein. Nasal/orogastric tube passes at least the distal esophagus, likely into the stomach by below the included field of view. Support apparatus is stable. Cardiac silhouette is mildly enlarged. No mediastinal or hilar masses. There is opacity at the lung bases increased from prior study. This is likely atelectasis. Pneumonia is possible. Asymmetric pulmonary edema is possible. IMPRESSION: 1.  Support apparatus is stable and positioned as detailed above. 2. Increased opacity at the lung bases when compared to the most recent prior exam. This is most likely atelectasis. Pneumonia or asymmetric edema are possible but thought less likely. Electronically Signed   By: Lajean Manes M.D.   On: 09/13/2016 10:14    Labs: BMET  Recent Labs Lab 09/12/16 0414 09/12/16 1144 09/12/16 1600 09/13/16 0405 09/13/16 1513 09/14/16 0307 09/14/16 1515 09/15/16 0417  NA 138  --  139 136 135 134* 134* 133*  K 3.9  --  4.4 4.7 4.9 5.0 5.4* 5.1  CL 106  --  103 101 102 100* 99* 99*  CO2 18*  --  22 23 23 24 24 23   GLUCOSE 104*  --  87 93 106* 106* 151* 116*  BUN 86*  --  65* 54* 46* 41* 33* 32*  CREATININE 9.17*  --  7.57* 6.07* 5.41* 4.87* 4.64* 4.42*  CALCIUM 5.6*  --  5.7* 6.1* 6.4* 6.7* 7.0* 6.9*  PHOS 6.6* 6.7* 6.1* 5.7* 5.7* 6.6* 6.2* 5.8*   CBC  Recent Labs Lab 09/11/16 0548  09/12/16 1726 09/13/16 0415 09/14/16 0307 09/15/16 0417  WBC 14.6*  < > 9.2 9.5 12.5* 15.8*  NEUTROABS 12.8*  --   --   --   --   --   HGB 7.0*  < > 7.9* 8.2* 9.3* 9.3*  HCT 21.7*  < > 23.0* 25.0* 28.9* 29.9*  MCV 72.6*  < > 75.2* 75.5* 78.5 80.2  PLT 273  < > 203 222 242 232  < > = values in this interval not displayed.  Medications:    . amLODipine  10 mg Oral Daily  . chlorhexidine gluconate (MEDLINE KIT)  15 mL Mouth Rinse BID  . Chlorhexidine Gluconate Cloth  6 each Topical Daily  . doxercalciferol  4 mcg Intravenous Once  . feeding supplement (PRO-STAT SUGAR FREE 64)  30 mL Per Tube QID  . feeding supplement (VITAL HIGH PROTEIN)  1,000 mL Per Tube Q24H  . hydrALAZINE  10 mg Intravenous Q6H  . insulin aspart  1-3 Units Subcutaneous Q4H  . levothyroxine  75 mcg Intravenous Daily  . mouth rinse  15 mL Mouth Rinse QID  . pantoprazole (PROTONIX) IV  40 mg Intravenous QHS  . sodium chloride flush  10-40 mL Intracatheter Q12H    Wendee Beavers, MD.  PGY-2 Pager [phone] 09/15/16  7:21  AM  I have seen and examined this patient and agree with plan and assessment in the above note with renal recommendations/intervention highlighted.  Self-extubated today.  Plans per PCCM.  Continue with CVVHD while filter remains functioning and will plan to transition to IHD tomorrow.  Added clonidine patch for BP control until he can take po. Broadus John A Tracer Gutridge,MD 09/15/2016 12:22 PM

## 2016-09-15 NOTE — Procedures (Signed)
Intubation Procedure Note Paul [name] [id number] [birth year]  Procedure: Intubation Indications: Respiratory insufficiency  Procedure Details Consent: Risks of procedure as well as the alternatives and risks of each were explained to the (patient/caregiver).  Consent for procedure obtained. and Unable to obtain consent because of altered level of consciousness. Time Out: Verified patient identification, verified procedure, site/side was marked, verified correct patient position, special equipment/implants available, medications/allergies/relevent history reviewed, required imaging and test results available.  Performed  Maximum sterile technique was used including cap, gloves, gown, hand hygiene and mask.  MAC  3 glidecope used.  Evaluation Hemodynamic Status: BP stable throughout; O2 sats: transiently fell during during procedure Patient's Current Condition: stable Complications: No apparent complications Patient did tolerate procedure well. Chest X-ray ordered to verify placement.  CXR: pending.   Paul [name] 09/15/2016

## 2016-09-15 NOTE — Progress Notes (Signed)
Pt self extubated.  Pt placed on 100% NRB and tolerating well.  Vital signs stable, sats 100%.  Pt is groggy but following commands. RT will continue to monitor.

## 2016-09-15 NOTE — Progress Notes (Signed)
Pt noted to be more lethargic, mouth breathing, and with SaO2 trending down (94% on 80% bipap). ABG obtained with critical results (pH 7.135, CO2 78.3). MD Hetty Ely paged to make aware. Waiting new orders

## 2016-09-15 NOTE — Progress Notes (Signed)
Spoke with on call nephrology MD about stopping CRRT for the night.

## 2016-09-15 NOTE — Progress Notes (Addendum)
[facility] Progress Note Patient Name: Shannen Vernon DOB: 1991/03/26 MRN: [id number]   Date of Service  09/15/2016  HPI/Events of Note  Resp acidosis on vent  eICU Interventions  Increase RR from 18 to 22 recheck ABG     Intervention Category Intermediate Interventions: Other:  Kadeen Sroka 09/15/2016, 5:56 PM

## 2016-09-15 NOTE — Progress Notes (Signed)
Paged by Casey Burkitt that he was looking less alert. ABG notable for worsening respiratory acidosis. At bedside, he was able to follow commands, but given that he still has significant pulmonary edema, we decided to reintubate.   To cut down on his volume, we will leave him on propofol and prn fentanyl. Should he require more frequent fentanyl, he will need the concentrated formulation.  ADDENDUM 09/15/2016  4:11 PM:  Follow-up CXR showed whiteout of the right lung suspicious for collapse. Bedside bronch noted mucus plug of the right main bronchus which was suctioned out.

## 2016-09-15 NOTE — Procedures (Signed)
Bronchoscopy Procedure Note Syon Tews [id number] 1991/01/09  Procedure: Bronchoscopy Indications: Remove secretions  Procedure Details Consent: Unable to obtain consent because of emergent medical necessity. Time Out: Verified patient identification, verified procedure, site/side was marked, verified correct patient position, special equipment/implants available, medications/allergies/relevent history reviewed, required imaging and test results available.  Performed  In preparation for procedure, patient was given 100% FiO2 and bronchoscope lubricated. Sedation: Muscle relaxants  Airway entered and the following bronchi were examined: RUL, RML, RLL, LUL, LLL and Bronchi.   Procedures performed: Brushings performed- none Bronchoscope removed.  , Patient placed back on 100% FiO2 at conclusion of procedure.    Evaluation Hemodynamic Status: BP stable throughout; O2 sats: stable throughout Patient's Current Condition: stable Specimens:  Sent purulent fluid Complications: No apparent complications Patient did tolerate procedure well.   [name]. 09/15/2016   1. Complete obstruction rt main, suctioned clean all lobes 2. BAL done, purulence 3. Left wnl  [name]. Titus Mould, MD, [facility] Pgr: [facility]

## 2016-09-15 NOTE — Progress Notes (Signed)
PULMONARY  / CRITICAL CARE MEDICINE  Name: Paul [name] MRN: [id number] DOB: Jun 25, 1990    LOS: 67  REFERRING MD :  ED  CHIEF COMPLAINT:  "Cant breathe "   HISTORY OF PRESENT ILLNESS:   26 yo male with DM , HTN , Hyperlipidemia , Renal Insufficiency and Morbid obesity presented to ER via EMS emergently with confusion and respiratory distress . On arrival found to have BS at 70. Given D50 by EMS . Pt w/ hypoxia,  tachypnea , temp 94. and in resp distress placed on BIPAP support. Says has had cold/bronchitic symptoms for last 2 weeks with cough/congestion . Seen by PCP 1 week ago, given Zpack with no improvement and progression of sob with lower extremity swelling. Labs returned showed severe acidosis with pH at 7.118, PCO2 43, PO 44, HCO3 14. Hbg 6.1, K 6.9 , Scr 15  . CXR showed diffuse interstitial marking ? puml edema . Lactic acid 0.45 . Troponin neg.  D Dimer 17. Pt was started on Vanc and Zosyn . b/p elevated on arrival , w/ no hypotension. In ER he was given lasix 34m x 1 , solumedrol x 1  . Given D50 x 2 , started on HCO3 drip , calcium given and albuterol given . On arrival to ER , pt in severe resp distress on BIPAP . Still alert but very agitated with increased wob, accessory use and tachypnea . Pt will require emergent intubation and Hemodialysis . Mother and Sister at bedside with update given .    INTERVAL HISTORY: Unable to wean yesterday due to dyssynchrony. AI workup notable for ANA+, increased total complement. HbS+ on hemoglobinopathy eval.  PAST MEDICAL HISTORY :  Past Medical History:  Diagnosis Date  . Diabetes mellitus   . Hypertension   . Thyroid disease    History reviewed. No pertinent surgical history. Prior to Admission medications   Medication Sig Start Date End Date Taking? Authorizing Provider  amLODipine (NORVASC) 10 MG tablet Take 1 tablet (10 mg total) by mouth daily. 11/28/14  Yes RMariel Aloe MD  insulin glargine (LANTUS) 100 UNIT/ML injection Inject 50  u of insulin into the skin BID Patient taking differently: Inject 50 Units into the skin 2 (two) times daily. Inject 50 u of insulin into the skin BID 05/30/16  Yes DJanne Napoleon NP  levothyroxine (SYNTHROID, LEVOTHROID) 150 MCG tablet Take 1 tablet (150 mcg total) by mouth daily. Needs office visit 01/01/16  Yes Ashly MWindell Moulding DO  rosuvastatin (CRESTOR) 20 MG tablet Take 1 tablet (20 mg total) by mouth daily. 11/28/14  Yes RMariel Aloe MD  ACCU-CHEK FASTCLIX LANCETS MISC 1 each by Does not apply route 4 (four) times daily -  before meals and at bedtime. 03/21/14   RMariel Aloe MD  Blood Glucose Monitoring Suppl (ACCU-CHEK NANO SMARTVIEW) W/DEVICE KIT 1 Device by Does not apply route 4 (four) times daily -  before meals and at bedtime. 03/21/14   RMariel Aloe MD  ciprofloxacin (CIPRO) 500 MG tablet Take 1 tablet (500 mg total) by mouth every 12 (twelve) hours. Patient not taking: Reported on 09/11/2016 04/21/16   LFransico Meadow PA-C  glucose blood test strip Use as instructed 03/21/14   RMariel Aloe MD   No Known Allergies  FAMILY HISTORY:  Family History  Problem Relation Age of Onset  . Heart disease Mother   . Hypertension Mother    SOCIAL HISTORY:  reports that he has never smoked. He has  never used smokeless tobacco. He reports that he does not drink alcohol or use drugs.  VITAL SIGNS: Temp:  [97.5 F (36.4 C)-99.5 F (37.5 C)] 98.6 F (37 C) (04/26 0700) Pulse Rate:  [87-102] 89 (04/26 0700) Resp:  [14-23] 19 (04/26 0700) BP: (117-184)/(57-80) 137/58 (04/26 0600) SpO2:  [96 %-100 %] 100 % (04/26 0700) Arterial Line BP: (145-182)/(60-77) 147/64 (04/26 0700) FiO2 (%):  [40 %] 40 % (04/26 0400) Weight:  [168 kg (370 lb 6 oz)] 168 kg (370 lb 6 oz) (04/26 0411) HEMODYNAMICS:   VENTILATOR SETTINGS: Vent Mode: PRVC FiO2 (%):  [40 %] 40 % Set Rate:  [15 bmp-18 bmp] 18 bmp Vt Set:  [650 mL] 650 mL PEEP:  [5 cmH20] 5 cmH20 Plateau Pressure:  [26 cmH20-38 cmH20] 26  cmH20 INTAKE / OUTPUT: Intake/Output      04/25 0701 - 04/26 0700 04/26 0701 - 04/27 0700   I.V. (mL/kg) 2307.5 (13.7)    NG/GT 520    IV Piggyback     Total Intake(mL/kg) 2827.5 (16.8)    Urine (mL/kg/hr) 85 (0)    Other 9077 (2.3)    Stool     Total Output 9162     Net -6334.5            PHYSICAL EXAMINATION: Physical Exam  Constitutional: No distress.  HENT:  Head: Normocephalic and atraumatic.  ETT in place  Eyes: Conjunctivae are normal. No scleral icterus.  Neck:  L IJ cath, R HD cath in place  Cardiovascular: Normal rate and regular rhythm.   Pulmonary/Chest: Effort normal. No respiratory distress.  Breathing comfortably  Neurological:  Opens/closes eyes on command.  Skin: He is not diaphoretic.      LABS: Cbc  Recent Labs Lab 09/13/16 0415 09/14/16 0307 09/15/16 0417  WBC 9.5 12.5* 15.8*  HGB 8.2* 9.3* 9.3*  HCT 25.0* 28.9* 29.9*  PLT 222 242 232    Chemistry   Recent Labs Lab 09/13/16 0415  09/14/16 0307 09/14/16 1515 09/15/16 0417  NA  --   < > 134* 134* 133*  K  --   < > 5.0 5.4* 5.1  CL  --   < > 100* 99* 99*  CO2  --   < > _0 BUN  --   < > 41* 33* 32*  CREATININE  --   < > 4.87* 4.64* 4.42*  CALCIUM  --   < > 6.7* 7.0* 6.9*  MG 2.1  --  2.2  --  2.3  PHOS  --   < > 6.6* 6.2* 5.8*  GLUCOSE  --   < > 106* 151* 116*  < > = values in this interval not displayed.  Liver fxn  Recent Labs Lab 09/11/16 0819  09/14/16 0307 09/14/16 1515 09/15/16 0417  AST 43*  --   --   --   --   ALT 33  --   --   --   --   ALKPHOS 82  --   --   --   --   BILITOT 0.7  --   --   --   --   PROT 6.7  --   --   --   --   ALBUMIN 2.6*  < > 2.5* 2.6* 2.6*  < > = values in this interval not displayed. coags  Recent Labs Lab 09/11/16 0819  09/13/16 0415 09/14/16 0307 09/15/16 0417  APTT 35  < > _1 INR  1.44  --   --   --   --   < > = values in this interval not displayed. Sepsis markers  Recent Labs Lab 09/11/16 0632  09/11/16 0819 09/11/16 0823 09/11/16 1534 09/13/16 1513  LATICACIDVEN 0.45*  --  0.8 0.6  --   PROCALCITON  --  0.13  --   --  0.32   Cardiac markers  Recent Labs Lab 09/11/16 0819  TROPONINI <0.03   ABG  Recent Labs Lab 09/12/16 0130 09/12/16 0534  09/12/16 2002 09/13/16 0350 09/13/16 0935  PHART 7.469* 7.540*  < > 7.406 7.360 7.363  PCO2ART 21.1* 19.7*  < > 37.2 43.0 41.5  PO2ART 88.0 99.0  < > 86.0 75.3* 72.1*  HCO3 15.5* 17.0*  < > 23.4 23.7 23.0  TCO2 16 18  --  25  --   --   < > = values in this interval not displayed.  CBG trend  Recent Labs Lab 09/14/16 1134 09/14/16 1523 09/14/16 2003 09/14/16 2330 09/15/16 0401  GLUCAP 123* 152* 144* 119* 121*    LINES / TUBES: 4/22 ETT >> 4/22 HD cath >> 4/22 L IJ >>  CULTURES: 4/22 BC x 2 >> 4/22 Sputum CX >> 4/22 Viral panel negative  ANTIBIOTICS: 4/22 Levaquin >> 4/25 4/22 Rocephin >> 4/23  SIGNIFICANT EVENTS:  4/23: Intubated and dialyzed.  STUDIES:  Echo 4/22 EF 60-65%, moderate-sized pericardial effusion Renal US 4/23: Medical renal disease. CXR 4/23: Improved edema. Low lung volumes.    DIAGNOSES: Active Problems:   Acute respiratory failure (HCC)   Pressure injury of skin   Acute renal failure (HCC)   Metabolic acidosis   Acute pulmonary edema (HCC)   ASSESSMENT / PLAN:  PULMONARY A: Acute hypoxic respiratory failure with pulmonary edema w/ BIPAP failure: CXR with stable edema.  P:   Continue vent support today though will again try to wean today   CARDIOVASCULAR A:  HTN: BP improved. Moderate-sized pericardial effusion: Suspect uremia-related which should improve with dialysis.  P:  Continue home amlodipine 10 mg and hydralazine 60 mg every 6 hours   RENAL A:   Acute Renal Failure: s/p CRRT x 4 days with plan for intermittent HD today. ANA+ on AI workup raises concern for drug-induced SLE, mCTD, Sjogren's, systemic sclerosis. Metabolic Acidosis: Resolved.   Hyperkalemia Hypocalcemia  P:   Follow BMET CRRT per Nephrology, appreciate recommendations Speak to family about autoimmune disease history   GASTROINTESTINAL A: No acute issues.  P:   PPI  Continue tube feeds   HEMATOLOGIC A:   Acute microcytic anemia: Anemia of chronic disease per iron studies on admission. Hb 9.3 this morning. HbS on hemoglobinopathy eval which may account for his values. P:  Review smear with Dr. Darnell Level   INFECTIOUS A: Sepsis: Resolved.  P:   Continue reassessing.   ENDOCRINE A:   DM  Metabolic bone disease: Severe Vit D 6.8. PTH 159.    P:   SSI Replete Vit D per Nephrology. Received doxercalciferol on 4/22.    NEUROLOGIC A:   Acute encephalopathy: Improving  P:   RASS goal: -1 to -2   Continue propofol and fentanyl gtt   Charlott Rakes, PGY3 Internal Medicine Pager: [phone] 09/15/2016, 7:11 AM

## 2016-09-16 ENCOUNTER — Inpatient Hospital Stay (HOSPITAL_COMMUNITY): Payer: Medicaid Other

## 2016-09-16 DIAGNOSIS — G9340 Encephalopathy, unspecified: Secondary | ICD-10-CM

## 2016-09-16 LAB — RENAL FUNCTION PANEL
ANION GAP: 14 (ref 5–15)
Albumin: 2.4 g/dL — ABNORMAL LOW (ref 3.5–5.0)
Albumin: 2.7 g/dL — ABNORMAL LOW (ref 3.5–5.0)
Anion gap: 17 — ABNORMAL HIGH (ref 5–15)
BUN: 37 mg/dL — ABNORMAL HIGH (ref 6–20)
BUN: 22 mg/dL — AB (ref 6–20)
CHLORIDE: 91 mmol/L — AB (ref 101–111)
CO2: 21 mmol/L — ABNORMAL LOW (ref 22–32)
CO2: 26 mmol/L (ref 22–32)
CREATININE: 3.76 mg/dL — AB (ref 0.61–1.24)
Calcium: 7.1 mg/dL — ABNORMAL LOW (ref 8.9–10.3)
Calcium: 7.7 mg/dL — ABNORMAL LOW (ref 8.9–10.3)
Chloride: 99 mmol/L — ABNORMAL LOW (ref 101–111)
Creatinine, Ser: 5.26 mg/dL — ABNORMAL HIGH (ref 0.61–1.24)
GFR calc Af Amer: 24 mL/min — ABNORMAL LOW (ref >60)
GFR calc non Af Amer: 21 mL/min — ABNORMAL LOW (ref >60)
GFR, EST AFRICAN AMERICAN: 16 mL/min — AB (ref >60)
GFR, EST NON AFRICAN AMERICAN: 14 mL/min — AB (ref >60)
GLUCOSE: 140 mg/dL — AB (ref 65–99)
Glucose, Bld: 124 mg/dL — ABNORMAL HIGH (ref 65–99)
PHOSPHORUS: 5 mg/dL — AB (ref 2.5–4.6)
POTASSIUM: 5.2 mmol/L — AB (ref 3.5–5.1)
POTASSIUM: 4.4 mmol/L (ref 3.5–5.1)
Phosphorus: 5.1 mg/dL — ABNORMAL HIGH (ref 2.5–4.6)
Sodium: 134 mmol/L — ABNORMAL LOW (ref 135–145)
Sodium: 134 mmol/L — ABNORMAL LOW (ref 135–145)

## 2016-09-16 LAB — POCT I-STAT 3, ART BLOOD GAS (G3+)
Acid-base deficit: 3 mmol/L — ABNORMAL HIGH (ref 0.0–2.0)
Bicarbonate: 20.9 mmol/L (ref 20.0–28.0)
O2 Saturation: 99 %
PCO2 ART: 34.2 mmHg (ref 32.0–48.0)
PO2 ART: 127 mmHg — AB (ref 83.0–108.0)
Patient temperature: 37.8
TCO2: 22 mmol/L (ref 0–100)
pH, Arterial: 7.398 (ref 7.350–7.450)

## 2016-09-16 LAB — MAGNESIUM: Magnesium: 2.3 mg/dL (ref 1.7–2.4)

## 2016-09-16 LAB — CYCLIC CITRUL PEPTIDE ANTIBODY, IGG/IGA: CCP Antibodies IgG/IgA: 5 units (ref 0–19)

## 2016-09-16 LAB — SJOGRENS SYNDROME-B EXTRACTABLE NUCLEAR ANTIBODY: SSB (La) (ENA) Antibody, IgG: <0.2 AI (ref 0.0–0.9)

## 2016-09-16 LAB — BLOOD GAS, ARTERIAL
Acid-base deficit: 2.6 mmol/L — ABNORMAL HIGH (ref 0.0–2.0)
BICARBONATE: 21.6 mmol/L (ref 20.0–28.0)
Drawn by: 419771
FIO2: 80
LHR: 22 {breaths}/min
O2 Saturation: 99.1 %
PATIENT TEMPERATURE: 98.6
PCO2 ART: 36.5 mmHg (ref 32.0–48.0)
PEEP: 12 cmH2O
VT: 650 mL
pH, Arterial: 7.389 (ref 7.350–7.450)
pO2, Arterial: 184 mmHg — ABNORMAL HIGH (ref 83.0–108.0)

## 2016-09-16 LAB — CULTURE, BLOOD (ROUTINE X 2)
CULTURE: NO GROWTH
Culture: NO GROWTH
Culture: NO GROWTH
SPECIAL REQUESTS: ADEQUATE
SPECIAL REQUESTS: ADEQUATE

## 2016-09-16 LAB — ANTI-SCLERODERMA ANTIBODY

## 2016-09-16 LAB — CBC
HCT: 27.7 % — ABNORMAL LOW (ref 39.0–52.0)
HEMOGLOBIN: 8.9 g/dL — AB (ref 13.0–17.0)
MCH: 25.4 pg — ABNORMAL LOW (ref 26.0–34.0)
MCHC: 32.1 g/dL (ref 30.0–36.0)
MCV: 78.9 fL (ref 78.0–100.0)
Platelets: 208 10*3/uL (ref 150–400)
RBC: 3.51 MIL/uL — ABNORMAL LOW (ref 4.22–5.81)
RDW: 19.3 % — ABNORMAL HIGH (ref 11.5–15.5)
WBC: 18.3 10*3/uL — AB (ref 4.0–10.5)

## 2016-09-16 LAB — GLUCOSE, CAPILLARY
GLUCOSE-CAPILLARY: 116 mg/dL — AB (ref 65–99)
GLUCOSE-CAPILLARY: 108 mg/dL — AB (ref 65–99)
GLUCOSE-CAPILLARY: 115 mg/dL — AB (ref 65–99)
GLUCOSE-CAPILLARY: 137 mg/dL — AB (ref 65–99)
Glucose-Capillary: 118 mg/dL — ABNORMAL HIGH (ref 65–99)
Glucose-Capillary: 125 mg/dL — ABNORMAL HIGH (ref 65–99)

## 2016-09-16 LAB — SJOGRENS SYNDROME-A EXTRACTABLE NUCLEAR ANTIBODY

## 2016-09-16 LAB — PROCALCITONIN: PROCALCITONIN: 4.69 ng/mL

## 2016-09-16 LAB — APTT: APTT: 29 s (ref 24–36)

## 2016-09-16 LAB — RHEUMATOID FACTOR: RHEUMATOID FACTOR: 17.9 [IU]/mL — AB (ref 0.0–13.9)

## 2016-09-16 LAB — TRIGLYCERIDES: TRIGLYCERIDES: 340 mg/dL — AB (ref <150)

## 2016-09-16 MED ORDER — SODIUM CHLORIDE 0.9 % IV SOLN: 100.0000 mL | INTRAVENOUS | Status: DC | PRN

## 2016-09-16 MED ORDER — ACETAMINOPHEN 160 MG/5ML PO SOLN
650.0000 mg | ORAL | Status: DC | PRN
  Administered 2016-09-16 (×2): 650 mg
  Filled 2016-09-16 (×2): qty 20.3

## 2016-09-16 MED ORDER — ALTEPLASE 2 MG IJ SOLR
2.0000 mg | Freq: Once | INTRAMUSCULAR | Status: DC | PRN
  Filled 2016-09-16: qty 2

## 2016-09-16 MED ORDER — VANCOMYCIN HCL IN DEXTROSE 1-5 GM/200ML-% IV SOLN
1000.0000 mg | INTRAVENOUS | Status: DC | PRN
  Filled 2016-09-16: qty 200

## 2016-09-16 MED ORDER — VANCOMYCIN HCL 10 G IV SOLR
2500.0000 mg | Freq: Once | INTRAVENOUS | Status: AC
  Administered 2016-09-16: 2500 mg via INTRAVENOUS
  Filled 2016-09-16: qty 2500

## 2016-09-16 MED ORDER — VANCOMYCIN HCL IN DEXTROSE 1-5 GM/200ML-% IV SOLN
1000.0000 mg | Freq: Once | INTRAVENOUS | Status: AC
  Administered 2016-09-16: 1000 mg via INTRAVENOUS
  Filled 2016-09-16: qty 200

## 2016-09-16 MED ORDER — HEPARIN SODIUM (PORCINE) 1000 UNIT/ML DIALYSIS: 20.0000 [IU]/kg | INTRAMUSCULAR | Status: DC | PRN

## 2016-09-16 MED ORDER — PANTOPRAZOLE SODIUM 40 MG PO PACK
40.0000 mg | PACK | Freq: Every day | ORAL | Status: DC
  Administered 2016-09-16 – 2016-09-20 (×5): 40 mg
  Filled 2016-09-16 (×6): qty 20

## 2016-09-16 MED ORDER — PENTAFLUOROPROP-TETRAFLUOROETH EX AERO: 1.0000 "application " | INHALATION_SPRAY | CUTANEOUS | Status: DC | PRN

## 2016-09-16 MED ORDER — PIPERACILLIN-TAZOBACTAM 3.375 G IVPB
3.3750 g | Freq: Two times a day (BID) | INTRAVENOUS | Status: AC
  Administered 2016-09-16 – 2016-09-22 (×12): 3.375 g via INTRAVENOUS
  Filled 2016-09-16 (×12): qty 50

## 2016-09-16 MED ORDER — WHITE PETROLATUM GEL
Status: AC
  Administered 2016-09-16: 16:00:00
  Filled 2016-09-16: qty 1

## 2016-09-16 MED ORDER — LIDOCAINE HCL (PF) 1 % IJ SOLN: 5.0000 mL | INTRAMUSCULAR | Status: DC | PRN

## 2016-09-16 MED ORDER — PIPERACILLIN-TAZOBACTAM 3.375 G IVPB 30 MIN
3.3750 g | Freq: Once | INTRAVENOUS | Status: AC
  Administered 2016-09-16: 3.375 g via INTRAVENOUS
  Filled 2016-09-16: qty 50

## 2016-09-16 MED ORDER — HEPARIN SODIUM (PORCINE) 1000 UNIT/ML DIALYSIS: 1000.0000 [IU] | INTRAMUSCULAR | Status: DC | PRN

## 2016-09-16 MED ORDER — AMLODIPINE BESYLATE 10 MG PO TABS
10.0000 mg | ORAL_TABLET | Freq: Every day | ORAL | Status: DC
  Administered 2016-09-17 – 2016-09-28 (×8): 10 mg
  Filled 2016-09-16 (×10): qty 1

## 2016-09-16 MED ORDER — LIDOCAINE-PRILOCAINE 2.5-2.5 % EX CREA
1.0000 "application " | TOPICAL_CREAM | CUTANEOUS | Status: DC | PRN
  Filled 2016-09-16: qty 5

## 2016-09-16 NOTE — Progress Notes (Signed)
[facility] Progress Note    Assessment/ Plan:   Acute and/or chronic renal failure: improving- he had stage III CKD in 2016 with creat 1.8. Serum Cr 13.5 (on admit).  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  Very low Ca could argue for chronic renal failure. Autoimmune labs negative except for positive ANA. Follow up autoimmune labs ordered by primary team. RA mildly elevated.  +1.2L in and  -4.4L off.  He is net -20.4 L since admission. BP stable.   IHD today. Plan to take off 500 mL  Follow up other autoimmune labs  Daily renal function  Hypocalcemia: ?due renal failure. Ca 5.4->>7.1(corrects to 8.3). PTH 156  Continue hectrol  Dialy renal function  HyperK: K 5.2.   IHD with 0 K today  Continue monitoring  Met acidosis: improved. Bicarb 21. Likely due to renal failure.   Monitor with renal function panel  Sodium bicarb as needed  Acute Resp Failure: reintubated after self extubation yesterday.   On PRVC  Per primary  Sepsis: had fever, tachycardia and tachypnea overnight. Blood culture drawn.   On Vanco and Zosyn by primary  Microcytic anemia: s/p 4 unit. Hgb 8.9, stable. Anemia panel within normal. Iron sat 43%. Hemoglobinopathy with sickle cell trait  Daily H&H  HTN uncontrolled: elevated this morning.   On Amlodipine 10 mg daily, Clonidine patch 0.1 and labetalol as needed   DM2: last A1c  9.6 in 2016. Current A1c unreliable (after transfusion)  On insulin per primary   Subjective:   Reintubated after self extubation yesterday. Had fever, tachycardia and tachypnea over overnight. Blood culture drawn but not on antibiotics yet.    Objective:   BP 133/64 (BP Location: Right Leg)   Pulse (!) 106   Temp (!) 101.3 F (38.5 C)   Resp (!) 22   Ht 6' 2"  (1.88 m)   Wt (!) 163 kg (359 lb 5.6 oz)   SpO2 100%   BMI 46.14 kg/m   Intake/Output Summary (Last 24 hours) at 09/16/16 0718 Last data filed at  09/16/16 0500  Gross per 24 hour  Intake          1207.04 ml  Output             4417 ml  Net         -3209.96 ml   Weight change: -5 kg (-11 lb 0.4 oz)  Physical Exam: Gen: sleepy but follows command CVS:RRR Resp:on PRVC. Good air sounds bilaterally Abd: obese and soft.  Ext: no edema, warm to touch  Imaging: Dg Chest Port 1 View  Result Date: 09/16/2016 CLINICAL DATA:  Hypoxia EXAM: PORTABLE CHEST 1 VIEW COMPARISON:  September 15, 2016 FINDINGS: Endotracheal tube tip is 3.3 cm above the carina. Right jugular catheter tip is in the superior vena cava near the cavoatrial junction. Left jugular catheter tip is in the left innominate vein. Nasogastric tube tip and side port are below the diaphragm. No pneumothorax. There remains consolidation in portions of the right lower lobe, essentially stable. There is a small right pleural effusion. Left lung is clear. There is cardiomegaly with pulmonary venous hypertension. No adenopathy. No evident bone lesions. IMPRESSION: Tube and catheter positions as described without evident pneumothorax. Persistent consolidation throughout portions of the right lower lobe with small right pleural effusion. Left lung clear. Evidence of underlying pulmonary vascular congestion. Electronically Signed   By: Lowella Grip III M.D.   On: 09/16/2016 07:12  Dg Chest Port 1 View  Result Date: 09/15/2016 CLINICAL DATA:  26 year old male currently intubated EXAM: PORTABLE CHEST 1 VIEW COMPARISON:  Prior chest x-ray 09/15/2016 at 15:26 p.m. FINDINGS: The patient is intubated. The tip of the endotracheal tube is 3.4 cm above the carina. Left IJ approach central venous catheter with the tip overlying the left innominate vein. Right IJ approach non tunneled hemodialysis catheter with the tip at the level of the mid SVC. Incompletely imaged gastric tube. The tip lies below the diaphragm, likely within the stomach. Marked cardiomegaly. Improved aeration of the right lung likely  secondary to resolution of mucous plugging. There is still some persistent right lower lobe atelectasis. The left lung remains relatively clear. No evidence of pneumothorax. No acute osseous abnormality. IMPRESSION: 1. Improving aeration in the right lung likely secondary to resolution of right mainstem bronchus mucous plugging. There is still persistent atelectasis and airspace opacity throughout the right mid and the lower lobe. 2. Stable support apparatus. Electronically Signed   By: Jacqulynn Cadet M.D.   On: 09/15/2016 17:14   Dg Chest Port 1 View  Result Date: 09/15/2016 CLINICAL DATA:  Hypoxia EXAM: PORTABLE CHEST 1 VIEW COMPARISON:  Study obtained earlier in the day FINDINGS: Endotracheal tube tip is 4.3 cm above the carina. Central catheter tip is in the superior vena cava. Nasogastric tube tip and side port are below the diaphragm. No pneumothorax. There is abrupt termination of the proximal right main bronchus with diffuse opacification of the right lung. Left lung is clear. Heart size is normal. No adenopathy is appreciable in areas that can be assessed. No bone lesions are appreciable. IMPRESSION: Tube and catheter positions as described without pneumothorax. Abrupt termination of the proximal right main bronchus with diffuse opacification on the right. Suspect mucous plug in the proximal right main bronchus. There may also be a degree of pleural effusion and consolidation on the right. Left lung clear. These results will be called to the ordering clinician or representative by the Radiologist Assistant, and communication documented in the PACS or zVision Dashboard. Electronically Signed   By: Lowella Grip III M.D.   On: 09/15/2016 15:43   Dg Chest Port 1 View  Result Date: 09/15/2016 CLINICAL DATA:  Acute respiratory failure EXAM: PORTABLE CHEST 1 VIEW COMPARISON:  Yesterday FINDINGS: Endotracheal tube tip 2 cm above the carina. High position is improved from yesterday. Dialysis catheter  on the right and central line on the left, tips in unchanged position. Orogastric tube at least reaches the stomach. Unchanged cardiopericardial enlargement. Mildly improved aeration. There is still extensive lung opacity and low lung volumes. IMPRESSION: 1. Mildly high endotracheal tube.  Consider advancement by 2 cm. 2. Other hardware is in stable position. 3. Questionable improvement in aeration/edema. Lung volumes remain low and there is still extensive lung opacity. 4. Pericardial effusion. Cardiopericardial enlargement is unchanged. Electronically Signed   By: Monte Fantasia M.D.   On: 09/15/2016 07:36    Labs: BMET  Recent Labs Lab 09/13/16 0405 09/13/16 1513 09/14/16 0307 09/14/16 1515 09/15/16 0417 09/15/16 1659 09/16/16 0420  NA 136 135 134* 134* 133* 136 134*  K 4.7 4.9 5.0 5.4* 5.1 5.5* 5.2*  CL 101 102 100* 99* 99* 100* 99*  CO2 23 23 24 24 23 24  21*  GLUCOSE 93 106* 106* 151* 116* 142* 124*  BUN 54* 46* 41* 33* 32* 30* 37*  CREATININE 6.07* 5.41* 4.87* 4.64* 4.42* 4.16* 5.26*  CALCIUM 6.1* 6.4* 6.7* 7.0* 6.9* 7.4* 7.1*  PHOS 5.7* 5.7* 6.6* 6.2* 5.8* 7.1* 5.0*   CBC  Recent Labs Lab 09/11/16 0548  09/13/16 0415 09/14/16 0307 09/15/16 0417 09/16/16 0420  WBC 14.6*  < > 9.5 12.5* 15.8* 18.3*  NEUTROABS 12.8*  --   --   --   --   --   HGB 7.0*  < > 8.2* 9.3* 9.3* 8.9*  HCT 21.7*  < > 25.0* 28.9* 29.9* 27.7*  MCV 72.6*  < > 75.5* 78.5 80.2 78.9  PLT 273  < > 222 242 232 208  < > = values in this interval not displayed.  Medications:    . acetylcysteine  4 mL Nebulization BID  . albuterol  2.5 mg Nebulization Q4H  . amLODipine  10 mg Oral Daily  . chlorhexidine gluconate (MEDLINE KIT)  15 mL Mouth Rinse BID  . Chlorhexidine Gluconate Cloth  6 each Topical Daily  . cloNIDine  0.1 mg Transdermal Weekly  . doxercalciferol  4 mcg Intravenous Once  . feeding supplement (PRO-STAT SUGAR FREE 64)  30 mL Per Tube QID  . feeding supplement (VITAL HIGH PROTEIN)   1,000 mL Per Tube Q24H  . insulin aspart  1-3 Units Subcutaneous Q4H  . levothyroxine  75 mcg Intravenous Daily  . mouth rinse  15 mL Mouth Rinse QID  . pantoprazole (PROTONIX) IV  40 mg Intravenous QHS  . sodium chloride flush  10-40 mL Intracatheter Q12H    Wendee Beavers, MD.  PGY-2 Pager [phone] 09/16/16  7:18 AM  I have seen and examined this patient and agree with plan and assessment in the above note with renal recommendations/intervention highlighted.  Re-intubated and will plan for IHD with large volume UF as BP tolerates.  If he does not tolerate IHD will restart CVVHD. Governor Rooks Marilynne Dupuis,MD 09/16/2016 12:36 PM

## 2016-09-16 NOTE — Progress Notes (Addendum)
Pharmacy Antibiotic Note  Paul [name] is a 26 y.o. male admitted on 09/11/2016 with pneumonia and 5 days since admission.  Pharmacy has been consulted for broad spectrum Vancomycin and Zosyn dosing.  Fever - now apparent off CRRT. WBC up at 18.3.  SCr up 5.26, trending back up off CRRT.  S/p mucus plugging and R-lung collapse now re-intubated.   Plan: Zosyn 3.375g IV over 30 min x1. Vancomycin 2566m IV (concentrated to 133mmL to help reduce volume) x1. Follow-up HD plan for further dosing.  Follow-up BAL and blood cultures for ability to narrow.   Height: _0  (188 cm) Weight: (!) 359 lb 5.6 oz (163 kg) IBW/kg (Calculated) : 82.2  Temp (24hrs), Avg:99 F (37.2 C), Min:96.4 F (35.8 C), Max:101.7 F (38.7 C)   Recent Labs Lab 09/11/16 0632  09/11/16 0823 09/11/16 1534  09/12/16 1726  09/13/16 0415  09/14/16 0307 09/14/16 1515 09/15/16 0417 09/15/16 1659 09/16/16 0420  WBC  --   --   --   --   < > 9.2  --  9.5  --  12.5*  --  15.8*  --  18.3*  CREATININE  --   < >  --   --   < >  --   < >  --   < > 4.87* 4.64* 4.42* 4.16* 5.26*  LATICACIDVEN 0.45*  --  0.8 0.6  --   --   --   --   --   --   --   --   --   --   < > = values in this interval not displayed.  Estimated Creatinine Clearance: 35.1 mL/min (A) (by C-G formula based on SCr of 5.26 mg/dL (H)).    No Known Allergies  Antimicrobials this admission: LVQ 4/22 >>4/25 CTX 4/22 >>4/23 Vancomycin 4/22 x1; restart 4/27 >> Zosyn 4/22 x1; restart 4/27 >>  Dose adjustments this admission:   Microbiology results: 4/22 RVP: NEG 4/22 Leg UAg: NEG 4/22 Strep pneum UAg -NEG 4/22 BCx x3: ngtd 4/22 UCx: NEG 4/23 TA: normal flora 4/22 MRSA - NEG 4/26 BCx:  4/26 BAL Cx:   Thank you for allowing pharmacy to be a part of this patient's care.  JeSloan LeiterPharmD, BCPS Clinical Pharmacist Clinical phone 09/16/2016 until 3:30 PM - #[phone] hours, please call #[phone]/27/2018 8:19 AM   Addendum: HD today     Plan Zosyn 3.375g IV every 12 hours - every 4 hours.  Vancomycin 1g IV x1 post HD today. Follow-up HD schedule for further doses.   JeSloan LeiterPharmD, BCPS Clinical Pharmacist Clinical phone 09/16/2016 until 3:30 PM - #[phone] hours, please call #[phone]

## 2016-09-16 NOTE — Progress Notes (Signed)
PULMONARY  / CRITICAL CARE MEDICINE  Name: Paul [name] MRN: [id number] DOB: 1990-09-17    LOS: 5  REFERRING MD :  ED  CHIEF COMPLAINT:  "Cant breathe "   HISTORY OF PRESENT ILLNESS:   26 yo male with DM , HTN , Hyperlipidemia , Renal Insufficiency and Morbid obesity presented to ER via EMS emergently with confusion and respiratory distress . On arrival found to have BS at 22. Given D50 by EMS . Pt w/ hypoxia,  tachypnea , temp 94. and in resp distress placed on BIPAP support. Says has had cold/bronchitic symptoms for last 2 weeks with cough/congestion . Seen by PCP 1 week ago, given Zpack with no improvement and progression of sob with lower extremity swelling. Labs returned showed severe acidosis with pH at 7.118, PCO2 43, PO 44, HCO3 14. Hbg 6.1, K 6.9 , Scr 15  . CXR showed diffuse interstitial marking ? puml edema . Lactic acid 0.45 . Troponin neg.  D Dimer 17. Pt was started on Vanc and Zosyn . b/p elevated on arrival , w/ no hypotension. In ER he was given lasix 40mg  x 1 , solumedrol x 1  . Given D50 x 2 , started on HCO3 drip , calcium given and albuterol given . On arrival to ER , pt in severe resp distress on BIPAP . Still alert but very agitated with increased wob, accessory use and tachypnea . Pt will require emergent intubation and Hemodialysis . Mother and Sister at bedside with update given .    INTERVAL HISTORY: He self-extubated yesterday though had to be reintubated for worsening respiratory acidosis which was complicated by right mainstem bronchus obstruction from mucus plug. CRRT was stopped overnight after which he developed fever T100-101F.  VITAL SIGNS: Temp:  [96.4 F (35.8 C)-101.7 F (38.7 C)] 101.3 F (38.5 C) (04/27 0500) Pulse Rate:  [91-114] 106 (04/27 0500) Resp:  [16-24] 22 (04/27 0500) BP: (93-182)/(49-77) 133/64 (04/27 0400) SpO2:  [89 %-100 %] 100 % (04/27 0500) Arterial Line BP: (109-197)/(52-85) 119/60 (04/27 0500) FiO2 (%):  [40 %-100 %] 60 % (04/27  0410) Weight:  [163 kg (359 lb 5.6 oz)] 163 kg (359 lb 5.6 oz) (04/27 0200)   HEMODYNAMICS: CVP:  [13 mmHg] 13 mmHg  VENTILATOR SETTINGS: Vent Mode: PRVC FiO2 (%):  [40 %-100 %] 60 % Set Rate:  [15 bmp-22 bmp] 22 bmp Vt Set:  [650 mL] 650 mL PEEP:  [5 cmH20-12 cmH20] 12 cmH20 Plateau Pressure:  [22 cmH20-33 cmH20] 29 cmH20  INTAKE / OUTPUT: Intake/Output      04/26 0701 - 04/27 0700 04/27 0701 - 04/28 0700   I.V. (mL/kg) 1027 (6.3)    NG/GT 180    Total Intake(mL/kg) 1207 (7.4)    Urine (mL/kg/hr) 45 (0)    Other 4372 (1.1)    Total Output 4417     Net -3210           PHYSICAL EXAMINATION: Physical Exam  Constitutional: No distress.  HENT:  Head: Normocephalic and atraumatic.  ETT in place  Eyes: Conjunctivae are normal. No scleral icterus.  Neck:  L IJ cath, R HD cath in place  Cardiovascular: Normal rate and regular rhythm.   Pulmonary/Chest: Effort normal. No respiratory distress.  Breathing comfortably  Neurological:  Sleeping  Skin: He is not diaphoretic.      LABS: Cbc  Recent Labs Lab 09/14/16 0307 09/15/16 0417 09/16/16 0420  WBC 12.5* 15.8* 18.3*  HGB 9.3* 9.3* 8.9*  HCT 28.9* 29.9*  27.7*  PLT 242 232 208    Chemistry   Recent Labs Lab 09/14/16 0307  09/15/16 0417 09/15/16 1659 09/16/16 0420  NA 134*  < > 133* 136 134*  K 5.0  < > 5.1 5.5* 5.2*  CL 100*  < > 99* 100* 99*  CO2 24  < > 23 24 21*  BUN 41*  < > 32* 30* 37*  CREATININE 4.87*  < > 4.42* 4.16* 5.26*  CALCIUM 6.7*  < > 6.9* 7.4* 7.1*  MG 2.2  --  2.3  --  2.3  PHOS 6.6*  < > 5.8* 7.1* 5.0*  GLUCOSE 106*  < > 116* 142* 124*  < > = values in this interval not displayed.  Liver fxn  Recent Labs Lab 09/11/16 0819  09/15/16 0417 09/15/16 1659 09/16/16 0420  AST 43*  --   --   --   --   ALT 33  --   --   --   --   ALKPHOS 82  --   --   --   --   BILITOT 0.7  --   --   --   --   PROT 6.7  --   --   --   --   ALBUMIN 2.6*  < > 2.6* 2.6* 2.4*  < > = values in this  interval not displayed. coags  Recent Labs Lab 09/11/16 0819  09/14/16 0307 09/15/16 0417 09/16/16 0420  APTT 35  < > 30 28 29   INR 1.44  --   --   --   --   < > = values in this interval not displayed. Sepsis markers  Recent Labs Lab 09/11/16 0632 09/11/16 0819 09/11/16 0823 09/11/16 1534 09/13/16 1513 09/16/16 0420  LATICACIDVEN 0.45*  --  0.8 0.6  --   --   PROCALCITON  --  0.13  --   --  0.32 4.69   Cardiac markers  Recent Labs Lab 09/11/16 0819  TROPONINI <0.03   ABG  Recent Labs Lab 09/12/16 2002  09/15/16 1416 09/15/16 1648 09/16/16 0400  PHART 7.406  < > 7.150* 7.263* 7.389  PCO2ART 37.2  < > 74.7* 57.0* 36.5  PO2ART 86.0  < > 67.0* 70.0* 184*  HCO3 23.4  < > 26.4 25.8 21.6  TCO2 25  --  29 27  --   < > = values in this interval not displayed.  CBG trend  Recent Labs Lab 09/15/16 1146 09/15/16 1634 09/15/16 1939 09/15/16 2348 09/16/16 0341  GLUCAP 197* 140* 107* 114* 116*    LINES / TUBES: 4/22 ETT >> 4/22 HD cath >> 4/22 L IJ >>  CULTURES: 4/22 BC x 2 >> 4/22 Sputum CX >> 4/22 Viral panel negative 4/26 Sputum Cx: moderate GNR, GPC, few GPR 4/27 BC x 2 >>  ANTIBIOTICS: 4/22 Vanc/Zosyn x 1 4/22 Rocephin >> 4/23 4/22 Levaquin >> 4/25 4/27 Vanc/Zosyn >>  SIGNIFICANT EVENTS:  4/23: Intubated and dialyzed. 4/26: Self-extubated but reintubated complicated by mucus plugging of right mainstem bronchus. CRRT stopped.  STUDIES:  Echo 4/22 EF 60-65%, moderate-sized pericardial effusion Renal US 4/23: Medical renal disease. CXR 4/23: Improved edema. Low lung volumes.  DIAGNOSES: Active Problems:   Acute respiratory failure (HCC)   Pressure injury of skin   Acute renal failure (HCC)   Metabolic acidosis   Acute pulmonary edema (HCC)   Respiratory distress  ASSESSMENT / PLAN:  PULMONARY A: Acute hypoxic respiratory failure with pulmonary edema w/ BIPAP  failure  Mucus plugging of right mainstem bronchus  P:   Continue  vent support today though will again try to wean today Continue chest PT and Mucormyst   CARDIOVASCULAR A:  HTN: BP improved. Moderate-sized pericardial effusion: Suspect uremia-related which should improve with dialysis.   P:  Continue home amlodipine 10 mg and hydralazine 60 mg every 6 hours Repeat echo on 4/29 to reassess effusion size  RENAL A:   Acute Renal Failure: s/p CRRT x 4 days with plan for intermittent HD today. ANA+ on AI workup raises concern for drug-induced SLE, mCTD, Sjogren's, systemic sclerosis. Metabolic Acidosis: Resolved.  Hyperkalemia  Hypocalcemia  P:   Follow BMET CRRT per Nephrology, appreciate recommendations Speak to family about autoimmune disease history  GASTROINTESTINAL A: No acute issues.  P:   PPI  Continue tube feeds   HEMATOLOGIC A:   Anemia of chronic disease: Hb 9 this AM. Sickle cell trait P:  Follow CBC  INFECTIOUS A: Sepsis: PCT uptrending. CRRT may have masked his fever as curve peaked at 60F since 4/23.  P:   Follow repeat cultures Restart vanc/Zosyn for empiric coverage  ENDOCRINE A:   DM  Metabolic bone disease: Severe Vit D 6.8. PTH 159.    P:   SSI Vit D per Nephrology. Received doxercalciferol on 4/22.   NEUROLOGIC A:   Acute encephalopathy: Multifactorial in the setting of acidosis and sepsis though resolved. P:   RASS goal: -1 Continue propofol and fentanyl prn to minimize volume  Charlott Rakes, PGY3 Internal Medicine Pager: [phone] 09/16/2016, 7:15 AM   Attending Note:  26 year old morbidly obese male who presents to the hospital with renal, heart and respiratory failure.  Patient was extubated then reintubated on 4/26.  On exam, coarse BS diffusely.  CXR with cardiomegaly and pulmonary edema noted.  Discussed with PCCM-resident.  Transition to HD today from CRRT.  Hopefully continue volume negative.  Abx restarted due to fever overnight and PCT of 4.  Hold off extubation today and  continue weaning efforts.    The patient is critically ill with multiple organ systems failure and requires high complexity decision making for assessment and support, frequent evaluation and titration of therapies, application of advanced monitoring technologies and extensive interpretation of multiple databases.   Critical Care Time devoted to patient care services described in this note is  35  Minutes. This time reflects time of care of this signee Dr Jennet Maduro. This critical care time does not reflect procedure time, or teaching time or supervisory time of PA/NP/Med student/Med Resident etc but could involve care discussion time.  Rush Farmer, M.D. [facility] Pulmonary/Critical Care Medicine. Pager: [phone]. After hours pager: [phone].

## 2016-09-17 ENCOUNTER — Inpatient Hospital Stay (HOSPITAL_COMMUNITY): Payer: Medicaid Other

## 2016-09-17 LAB — BASIC METABOLIC PANEL
ANION GAP: 17 — AB (ref 5–15)
BUN: 34 mg/dL — AB (ref 6–20)
CHLORIDE: 92 mmol/L — AB (ref 101–111)
CO2: 23 mmol/L (ref 22–32)
Calcium: 7 mg/dL — ABNORMAL LOW (ref 8.9–10.3)
Creatinine, Ser: 5.09 mg/dL — ABNORMAL HIGH (ref 0.61–1.24)
GFR calc Af Amer: 17 mL/min — ABNORMAL LOW (ref >60)
GFR calc non Af Amer: 15 mL/min — ABNORMAL LOW (ref >60)
GLUCOSE: 98 mg/dL (ref 65–99)
POTASSIUM: 4.2 mmol/L (ref 3.5–5.1)
Sodium: 132 mmol/L — ABNORMAL LOW (ref 135–145)

## 2016-09-17 LAB — TRIGLYCERIDES: TRIGLYCERIDES: 375 mg/dL — AB (ref <150)

## 2016-09-17 LAB — HEPATITIS B CORE ANTIBODY, TOTAL: Hep B Core Total Ab: NEGATIVE

## 2016-09-17 LAB — GLUCOSE, CAPILLARY
GLUCOSE-CAPILLARY: 138 mg/dL — AB (ref 65–99)
Glucose-Capillary: 105 mg/dL — ABNORMAL HIGH (ref 65–99)
Glucose-Capillary: 115 mg/dL — ABNORMAL HIGH (ref 65–99)
Glucose-Capillary: 129 mg/dL — ABNORMAL HIGH (ref 65–99)
Glucose-Capillary: 132 mg/dL — ABNORMAL HIGH (ref 65–99)
Glucose-Capillary: 137 mg/dL — ABNORMAL HIGH (ref 65–99)

## 2016-09-17 LAB — CBC
HCT: 27.7 % — ABNORMAL LOW (ref 39.0–52.0)
Hemoglobin: 8.9 g/dL — ABNORMAL LOW (ref 13.0–17.0)
MCH: 24.9 pg — AB (ref 26.0–34.0)
MCHC: 32.1 g/dL (ref 30.0–36.0)
MCV: 77.4 fL — AB (ref 78.0–100.0)
PLATELETS: 266 10*3/uL (ref 150–400)
RBC: 3.58 MIL/uL — ABNORMAL LOW (ref 4.22–5.81)
RDW: 18.6 % — AB (ref 11.5–15.5)
WBC: 15.6 10*3/uL — ABNORMAL HIGH (ref 4.0–10.5)

## 2016-09-17 LAB — VANCOMYCIN, RANDOM: VANCOMYCIN RM: 45

## 2016-09-17 LAB — PROCALCITONIN: Procalcitonin: 5.64 ng/mL

## 2016-09-17 LAB — HEPATITIS B E ANTIGEN: Hep B E Ag: NEGATIVE

## 2016-09-17 LAB — HEPATITIS B SURFACE ANTIGEN: HEP B S AG: NEGATIVE

## 2016-09-17 MED ORDER — HEPARIN SODIUM (PORCINE) 1000 UNIT/ML DIALYSIS: 20.0000 [IU]/kg | INTRAMUSCULAR | Status: DC | PRN

## 2016-09-17 MED ORDER — PRO-STAT SUGAR FREE PO LIQD
60.0000 mL | Freq: Three times a day (TID) | ORAL | Status: DC
  Administered 2016-09-17 – 2016-09-21 (×9): 60 mL
  Filled 2016-09-17 (×15): qty 60

## 2016-09-17 MED ORDER — VITAL HIGH PROTEIN PO LIQD
1000.0000 mL | ORAL | Status: DC
  Administered 2016-09-17 – 2016-09-18 (×2): 1000 mL

## 2016-09-17 NOTE — Progress Notes (Signed)
Pharmacy Antibiotic Note  Paul [name] is a 26 y.o. male admitted on 09/11/2016 with sepsis.  Pharmacy has been consulted for vancomycin and zosyn dosing. Patient with CKD 3 in 2016 with baseline SCr ~1.8. Patient admitted with SCr 13.5, has required CRRT and has now been transitioned to [facility].   Vancomycin pre-HD level today is 45 (goal 15-25), will not redose after HD session this afternoon. Will follow continue to follow to assess for further doses.  Plan: - No vancomycin needed after 4/28 HD session - Continue zosyn 3.375g IV q12h - Monitor renal plans, C&S and duration of treatment  Height: _0  (188 cm) Weight: (!) 348 lb 5.2 oz (158 kg) IBW/kg (Calculated) : 82.2  Temp (24hrs), Avg:100.1 F (37.8 C), Min:99.7 F (37.6 C), Max:100.6 F (38.1 C)   Recent Labs Lab 09/11/16 [phone]  09/11/16 0823 09/11/16 1534  09/13/16 0415  09/14/16 0307  09/15/16 0417 09/15/16 1659 09/16/16 0420 09/16/16 1933 09/17/16 0520 09/17/16 1302  WBC  --   --   --   --   < > 9.5  --  12.5*  --  15.8*  --  18.3*  --  15.6*  --   CREATININE  --   < >  --   --   < >  --   < > 4.87*  < > 4.42* 4.16* 5.26* 3.76* 5.09*  --   LATICACIDVEN 0.45*  --  0.8 0.6  --   --   --   --   --   --   --   --   --   --   --   VANCORANDOM  --   --   --   --   --   --   --   --   --   --   --   --   --   --  45  < > = values in this interval not displayed.  Estimated Creatinine Clearance: 35.6 mL/min (A) (by C-G formula based on SCr of 5.09 mg/dL (H)).    No Known Allergies  Antimicrobials this admission: LVQ 4/22 >>4/25 CTX 4/22 >>4/23 Vancomycin 4/22 x1; restart 4/27 >> Zosyn 4/22 x1; restart 4/27 >>   Dose adjustments this admission: 4/28 Pre-HD vancomycin level: 45 (supratherapeutic; target preHD level 15-25)   Microbiology results: 4/22 RVP: NEG 4/22 Leg UAg: NEG 4/22 Strep pneum UAg -NEG 4/22 BCx x3: ngtd 4/22 UCx: NEG 4/23 TA: normal flora 4/22 MRSA - NEG 4/26 BAL Cx: Mod GN cocci in pairs, mod  GPC in pairs, Few GPR, rare yeast (reincubated) 4/27 BCx: ngtd  Thank you for allowing pharmacy to be a part of this patient's care.  Dimitri Ped, PharmD, BCPS PGY-2 Infectious Diseases Pharmacy Resident Pager: [phone] 09/17/2016 2:10 PM

## 2016-09-17 NOTE — Progress Notes (Signed)
PULMONARY  / CRITICAL CARE MEDICINE  Name: Paul [name] MRN: [id number] DOB: February 09, 1991    LOS: 78  REFERRING MD :  ED  CHIEF COMPLAINT:  "Cant breathe "   brief  26 yo male with DM , HTN , Hyperlipidemia , Renal Insufficiency and Morbid obesity presented to ER via EMS emergently with confusion and respiratory distress . On arrival found to have BS at 42. Given D50 by EMS . Pt w/ hypoxia,  tachypnea , temp 94. and in resp distress placed on BIPAP support. Says has had cold/bronchitic symptoms for last 2 weeks with cough/congestion . Seen by PCP 1 week ago, given Zpack with no improvement and progression of sob with lower extremity swelling. Labs returned showed severe acidosis with pH at 7.118, PCO2 43, PO 44, HCO3 14. Hbg 6.1, K 6.9 , Scr 15  . CXR showed diffuse interstitial marking ? puml edema . Lactic acid 0.45 . Troponin neg.  D Dimer 17. Pt was started on Vanc and Zosyn . b/p elevated on arrival , w/ no hypotension. In ER he was given lasix 40mg  x 1 , solumedrol x 1  . Given D50 x 2 , started on HCO3 drip , calcium given and albuterol given . On arrival to ER , pt in severe resp distress on BIPAP . Still alert but very agitated with increased wob, accessory use and tachypnea . Pt will require emergent intubation and Hemodialysis . Mother and Sister at bedside with update given .     LINES / TUBES: 4/22 ETT >> 4/22 HD cath >> 4/22 L IJ >>  CULTURES: 4/22 BC x 2 >> 4/22 Sputum CX >> 4/22 Viral panel negative 4/26 Sputum Cx: moderate GNR, GPC, few GPR 4/27 BC x 2 >>  ANTIBIOTICS: 4/22 Vanc/Zosyn x 1 4/22 Rocephin >> 4/23 4/22 Levaquin >> 4/25 4/27 Vanc/Zosyn >>  SIGNIFICANT EVENTS:  4/23: Intubated and dialyzed. 4/26: Self-extubated but reintubated complicated by mucus plugging of right mainstem bronchus. CRRT stopped.  STUDIES:  Echo 4/22 EF 60-65%, moderate-sized pericardial effusion Renal US 4/23: Medical renal disease. CXR 4/23: Improved edem  4/27 He self-extubated  yesterday though had to be reintubated for worsening respiratory acidosis which was complicated by right mainstem bronchus obstruction from mucus plug. CRRT was stopped overnight after which he developed fever T100-101F.    SUBJECTIVE/OVERNIGHT/INTERVAL HX 4/28 - on diprivan gtt. WUA pending but somewhat awake and following commands. Meets SBT criteria too. FEVER  + x 48h  VITAL SIGNS: Temp:  [99.9 F (37.7 C)-100.6 F (38.1 C)] 100.4 F (38 C) (04/28 0700) Pulse Rate:  [97-109] 103 (04/28 0749) Resp:  [16-26] 18 (04/28 0749) BP: (120-152)/(42-76) 136/72 (04/28 0749) SpO2:  [96 %-100 %] 100 % (04/28 0749) Arterial Line BP: (121-180)/(61-85) 140/61 (04/28 0700) FiO2 (%):  [40 %] 40 % (04/28 0749) Weight:  [156 kg (343 lb 14.7 oz)-163 kg (359 lb 5.6 oz)] 156 kg (343 lb 14.7 oz) (04/28 0500)   HEMODYNAMICS:    VENTILATOR SETTINGS: Vent Mode: PRVC FiO2 (%):  [40 %] 40 % Set Rate:  [18 bmp] 18 bmp Vt Set:  [650 mL] 650 mL PEEP:  [8 cmH20] 8 cmH20 Plateau Pressure:  [25 cmH20-29 cmH20] 25 cmH20  INTAKE / OUTPUT: Intake/Output      04/27 0701 - 04/28 0700 04/28 0701 - 04/29 0700   I.V. (mL/kg) 1326.8 (8.5)    NG/GT 170    IV Piggyback 500    Total Intake(mL/kg) 1996.8 (12.8)    Urine (  mL/kg/hr) 48 (0)    Other 5000 (1.3)    Total Output 5048     Net -3051.2           PHYSICAL EXAMINATION: Physical Exam  Constitutional: He is well-developed, well-nourished, and in no distress. No distress.  obese  HENT:  Head: Normocephalic and atraumatic.  ETT in place  Eyes: Conjunctivae are normal. Right eye exhibits no discharge. Left eye exhibits no discharge. No scleral icterus.  Neck: Neck supple. No JVD present.  L IJ cath, R HD cath in place  Cardiovascular: Normal rate and regular rhythm.   Pulmonary/Chest: Effort normal and breath sounds normal. No respiratory distress.  Breathing comfortably onv ent  Abdominal: Soft. He exhibits no distension and no mass. There is no  tenderness. There is no rebound and no guarding.  Musculoskeletal: He exhibits edema.  Neurological:  On diprvian - RASS -1  Skin: He is not diaphoretic.      LABS: PULMONARY  Recent Labs Lab 09/12/16 0534  09/12/16 2002  09/13/16 0935 09/15/16 1416 09/15/16 1648 09/16/16 0400 09/16/16 1129  PHART 7.540*  < > 7.406  < > 7.363 7.150* 7.263* 7.389 7.398  PCO2ART 19.7*  < > 37.2  < > 41.5 74.7* 57.0* 36.5 34.2  PO2ART 99.0  < > 86.0  < > 72.1* 67.0* 70.0* 184* 127.0*  HCO3 17.0*  < > 23.4  < > 23.0 26.4 25.8 21.6 20.9  TCO2 18  --  25  --   --  29 27  --  22  O2SAT 99.0  < > 97.0  < > 93.7 88.0 91.0 99.1 99.0  < > = values in this interval not displayed.  CBC  Recent Labs Lab 09/15/16 0417 09/16/16 0420 09/17/16 0520  HGB 9.3* 8.9* 8.9*  HCT 29.9* 27.7* 27.7*  WBC 15.8* 18.3* 15.6*  PLT 232 208 266    COAGULATION  Recent Labs Lab 09/11/16 0819  INR 1.44    CARDIAC   Recent Labs Lab 09/11/16 0819  TROPONINI <0.03   No results for input(s): PROBNP in the last 168 hours.   CHEMISTRY  Recent Labs Lab 09/12/16 1144  09/13/16 0415  09/14/16 0307 09/14/16 1515 09/15/16 0417 09/15/16 1659 09/16/16 0420 09/16/16 1933 09/17/16 0520  NA  --   < >  --   < > 134* 134* 133* 136 134* 134* 132*  K  --   < >  --   < > 5.0 5.4* 5.1 5.5* 5.2* 4.4 4.2  CL  --   < >  --   < > 100* 99* 99* 100* 99* 91* 92*  CO2  --   < >  --   < > 24 24 23 24  21* 26 23  GLUCOSE  --   < >  --   < > 106* 151* 116* 142* 124* 140* 98  BUN  --   < >  --   < > 41* 33* 32* 30* 37* 22* 34*  CREATININE  --   < >  --   < > 4.87* 4.64* 4.42* 4.16* 5.26* 3.76* 5.09*  CALCIUM  --   < >  --   < > 6.7* 7.0* 6.9* 7.4* 7.1* 7.7* 7.0*  MG 2.0  --  2.1  --  2.2  --  2.3  --  2.3  --   --   PHOS 6.7*  < >  --   < > 6.6* 6.2* 5.8* 7.1* 5.0* 5.1*  --   < > =  values in this interval not displayed. Estimated Creatinine Clearance: 35.4 mL/min (A) (by C-G formula based on SCr of 5.09 mg/dL  (H)).   LIVER  Recent Labs Lab 09/11/16 0819  09/14/16 1515 09/15/16 0417 09/15/16 1659 09/16/16 0420 09/16/16 1933  AST 43*  --   --   --   --   --   --   ALT 33  --   --   --   --   --   --   ALKPHOS 82  --   --   --   --   --   --   BILITOT 0.7  --   --   --   --   --   --   PROT 6.7  --   --   --   --   --   --   ALBUMIN 2.6*  < > 2.6* 2.6* 2.6* 2.4* 2.7*  INR 1.44  --   --   --   --   --   --   < > = values in this interval not displayed.   INFECTIOUS  Recent Labs Lab 09/11/16 7494  09/11/16 0823 09/11/16 1534 09/13/16 1513 09/16/16 0420 09/17/16 0520  LATICACIDVEN 0.45*  --  0.8 0.6  --   --   --   PROCALCITON  --   < >  --   --  0.32 4.69 5.64  < > = values in this interval not displayed.   ENDOCRINE CBG (last 3)   Recent Labs  09/16/16 2318 09/17/16 0306 09/17/16 0738  GLUCAP 125* 105* 115*      cxr - slowly improvnig   IMAGING x48h  - image(s) personally visualized  -   highlighted in bold Dg Chest Port 1 View  Result Date: 09/17/2016 CLINICAL DATA:  Acute respiratory failure EXAM: PORTABLE CHEST 1 VIEW COMPARISON:  09/16/2016 FINDINGS: Endotracheal tube with the tip 3 cm above the carina. Nasogastric tube coursing below the diaphragm. Right jugular central venous catheter with the tip projecting over the cavoatrial junction. Bilateral diffuse interstitial thickening. Possible trace bilateral pleural effusions. No pneumothorax. Stable cardiomegaly. IMPRESSION: 1. Support lines and tubing in satisfactory position. 2. Mild CHF. Electronically Signed   By: Kathreen Devoid   On: 09/17/2016 08:08   Dg Chest Port 1 View  Result Date: 09/16/2016 CLINICAL DATA:  Hypoxia EXAM: PORTABLE CHEST 1 VIEW COMPARISON:  September 15, 2016 FINDINGS: Endotracheal tube tip is 3.3 cm above the carina. Right jugular catheter tip is in the superior vena cava near the cavoatrial junction. Left jugular catheter tip is in the left innominate vein. Nasogastric tube tip and side  port are below the diaphragm. No pneumothorax. There remains consolidation in portions of the right lower lobe, essentially stable. There is a small right pleural effusion. Left lung is clear. There is cardiomegaly with pulmonary venous hypertension. No adenopathy. No evident bone lesions. IMPRESSION: Tube and catheter positions as described without evident pneumothorax. Persistent consolidation throughout portions of the right lower lobe with small right pleural effusion. Left lung clear. Evidence of underlying pulmonary vascular congestion. Electronically Signed   By: Lowella Grip III M.D.   On: 09/16/2016 07:12   Dg Chest Port 1 View  Result Date: 09/15/2016 CLINICAL DATA:  26 year old male currently intubated EXAM: PORTABLE CHEST 1 VIEW COMPARISON:  Prior chest x-ray 09/15/2016 at 15:26 p.m. FINDINGS: The patient is intubated. The tip of the endotracheal tube is 3.4 cm above the carina. Left IJ approach  central venous catheter with the tip overlying the left innominate vein. Right IJ approach non tunneled hemodialysis catheter with the tip at the level of the mid SVC. Incompletely imaged gastric tube. The tip lies below the diaphragm, likely within the stomach. Marked cardiomegaly. Improved aeration of the right lung likely secondary to resolution of mucous plugging. There is still some persistent right lower lobe atelectasis. The left lung remains relatively clear. No evidence of pneumothorax. No acute osseous abnormality. IMPRESSION: 1. Improving aeration in the right lung likely secondary to resolution of right mainstem bronchus mucous plugging. There is still persistent atelectasis and airspace opacity throughout the right mid and the lower lobe. 2. Stable support apparatus. Electronically Signed   By: Jacqulynn Cadet M.D.   On: 09/15/2016 17:14   Dg Chest Port 1 View  Result Date: 09/15/2016 CLINICAL DATA:  Hypoxia EXAM: PORTABLE CHEST 1 VIEW COMPARISON:  Study obtained earlier in the day  FINDINGS: Endotracheal tube tip is 4.3 cm above the carina. Central catheter tip is in the superior vena cava. Nasogastric tube tip and side port are below the diaphragm. No pneumothorax. There is abrupt termination of the proximal right main bronchus with diffuse opacification of the right lung. Left lung is clear. Heart size is normal. No adenopathy is appreciable in areas that can be assessed. No bone lesions are appreciable. IMPRESSION: Tube and catheter positions as described without pneumothorax. Abrupt termination of the proximal right main bronchus with diffuse opacification on the right. Suspect mucous plug in the proximal right main bronchus. There may also be a degree of pleural effusion and consolidation on the right. Left lung clear. These results will be called to the ordering clinician or representative by the Radiologist Assistant, and communication documented in the PACS or zVision Dashboard. Electronically Signed   By: Lowella Grip III M.D.   On: 09/15/2016 15:43     DIAGNOSES: Active Problems:   Acute respiratory failure (HCC)   Pressure injury of skin   Acute renal failure (HCC)   Metabolic acidosis   Acute pulmonary edema (HCC)   Respiratory distress  ASSESSMENT / PLAN:  PULMONARY A: Acute hypoxic respiratory failure with pulmonary edema w/ BIPAP failure  Mucus plugging of right mainstem bronchus   09/17/2016 = meets sbt criteria  P:   Continue vent support today though will again try to wean today Continue chest PT and Mucormyst   CARDIOVASCULAR A:  HTN: BP improved. Moderate-sized pericardial effusion: Suspect uremia-related which should improve with dialysis.   09/17/2016 = BP high suggesting room for HD  P:  Continue home amlodipine 10 mg and hydralazine 60 mg every 6 hours Repeat echo on 4/29 to reassess effusion size Dc aline 09/17/2016   RENAL A:   Acute Renal Failure: s/p CRRT x 4 days with plan for intermittent HD today. ANA+ on AI workup  raises concern for drug-induced SLE, mCTD, Sjogren's, systemic sclerosis. Metabolic Acidosis: Resolved.  Hyperkalemia  Hypocalcemia  4/29 -s/p HD yesterday with 5L removeal  P:   Follow BMET Try HD again 09/17/2016 and get volume off (BP high) Speak to family about autoimmune disease history  GASTROINTESTINAL A: No acute issues.  P:   PPI  Continue tube feeds   HEMATOLOGIC A:   Anemia of chronic disease: Hb 9 this AM. Sickle cell trait P:  Follow CBC  INFECTIOUS A: Sepsis: PCT uptrending. CRRT may have masked his fever as curve peaked at 85F since 4/23.  09/17/2016 - fever x 48h but improving wbc  P:   Follow repeat cultures vanc/Zosyn for empiric coverage  ENDOCRINE A:   DM  Metabolic bone disease: Severe Vit D 6.8. PTH 159.    P:   SSI Vit D per Nephrology. Received doxercalciferol on 4/22.   NEUROLOGIC A:   Acute encephalopathy: Multifactorial in the setting of acidosis and sepsis though resolved. P:   RASS goal: -1 Continue propofol and fentanyl prn to minimize volume    The patient is critically ill with multiple organ systems failure and requires high complexity decision making for assessment and support, frequent evaluation and titration of therapies, application of advanced monitoring technologies and extensive interpretation of multiple databases.   Critical Care Time devoted to patient care services described in this note is  30  Minutes. This time reflects time of care of this signee Dr Brand Males. This critical care time does not reflect procedure time, or teaching time or supervisory time of PA/NP/Med student/Med Resident etc but could involve care discussion time    Dr. Brand Males, M.D., [facility].C.P Pulmonary and Critical Care Medicine Staff Physician [facility] Pager: [phone], If no answer or between  15:00h - 7:00h: call [phone]  09/17/2016 8:19 AM

## 2016-09-17 NOTE — Progress Notes (Signed)
Nutrition Follow-up  DOCUMENTATION CODES:   Morbid obesity  INTERVENTION:  Recommend Vital High Protein at 40 ml/hr with Pro-Stat 60 ml TID. Provides 1560 kcal, 174 grams of protein, 803 ml water daily. With propofol provides 2759 kcal daily.  NUTRITION DIAGNOSIS:   Inadequate oral intake related to inability to eat as evidenced by NPO status.  Ongoing.   GOAL:   Provide needs based on ASPEN/SCCM guidelines  Met with advancement to goal rate.  MONITOR:   Vent status, Diet advancement, I & O's, Labs  REASON FOR ASSESSMENT:   Consult Enteral/tube feeding initiation and management  ASSESSMENT:   26 yo male morbidly obese with HTN/DM presented with severe metabolic acidosis , acute renal failure and acute hypoxic resp failure with pulmonary edema.   -Patient had to be reintubated for worsening respiratory acidosis. CRRT was discontinued and patient now on intermittent HD.  Access: OGT; per chest x-ray 4/28  tube tip below diaphragm   TF: currently receiving Vital High Protein @ 10 ml/hr and ordered for Pro-Stat 30 ml QID  Patient is currently intubated on ventilator support MV: 11.2 L/min Temp (24hrs), Avg:100.2 F (37.9 C), Min:99.9 F (37.7 C), Max:100.6 F (38.1 C)  Propofol: currently off; was running at 45.4 ml/hr (1199 kcal)  Medications reviewed and include: Novolog sliding scale Q4hrs, levothyorxine, pantoprazole, Zosyn.  Labs reviewed: CBG 105-137 past 24 hrs, Sodium 132, Chloride 92, BUN 34, Creatinine 5.09, EGFR 17.   Discussed with RN. Patient will likely go back on propofol. Plan is to advance to goal TF regimen today.   Diet Order:  Diet NPO time specified  Skin:  Wound (see comment) (stage II to groin)  Last BM:  PTA  Height:   Ht Readings from Last 1 Encounters:  09/11/16 6' 2"  (1.88 m)    Weight:   Wt Readings from Last 1 Encounters:  09/17/16 (!) 343 lb 14.7 oz (156 kg)    Ideal Body Weight:  86.4 kg  BMI:  Body mass index is  44.16 kg/m.  Estimated Nutritional Needs:   Kcal:  0174-9449  Protein:  175-200 gm  Fluid:  2 L  EDUCATION NEEDS:   No education needs identified at this time  Willey Blade, MS, RD, LDN Pager: [phone] After Hours Pager: [phone]

## 2016-09-17 NOTE — Progress Notes (Signed)
CPT withheld at this time.  Pt getting dialysis.

## 2016-09-17 NOTE — Progress Notes (Signed)
[facility] Progress Note    Assessment/ Plan:   Acute and/or chronic renal failure: improving- he had stage III CKD in 2016 with creat 1.8. Serum Cr 13.5 (on admit).  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  Very low Ca could argue for chronic renal failure. Autoimmune labs negative except for positive ANA and mildly elevated RF. 5L taken off by HD yesterday. Patient tolerated this well. CVP 10 this morning.  Plan for another session of iHD today. Would like to take off 2-3L.  Follow up other autoimmune labs  Daily renal function  Hypocalcemia: ?due renal failure. Ca 7.0 this morning (no albumin for correction). PTH 156.  Continue hectrol   Daily renal function panel  Met acidosis: improving after HD. Bicarb 23 today. Likely due to renal failure.   Monitor with renal function panel  Acute Resp Failure: remains intubated. SBT today.  On PRVC  Per primary  Sepsis: improving.  On Vanc and Zosyn by primary  Blood cultures pending  Microcytic anemia: s/p 4 units. Hgb 8.9, stable. Anemia panel within normal. Iron sat 43%. Hemoglobinopathy with sickle cell trait  Daily H&H  HTN: Much improved after 5L taken off with HD yesterday.   On Amlodipine 10 mg daily, Clonidine patch 0.1 and labetalol as needed   DM2: last A1c  9.6 in 2016. Current A1c unreliable (after transfusion)  On insulin per primary   Subjective:    Awake and following commands this morning. Did well with HD.   Objective:   BP 135/60   Pulse 100   Temp 99.7 F (37.6 C)   Resp 19   Ht 6' 2"  (1.88 m)   Wt (!) 343 lb 14.7 oz (156 kg)   SpO2 97%   BMI 44.16 kg/m   Intake/Output Summary (Last 24 hours) at 09/17/16 1332 Last data filed at 09/17/16 1200  Gross per 24 hour  Intake          1752.12 ml  Output             5058 ml  Net         -3305.88 ml   Weight change: 0 lb (0 kg)  Physical Exam: Gen: Awake, alert, in NAD CVS:RRR Resp:  breathing comfortably on vent, lungs with mild rhonchi but otherwise clear. Abd: obese and soft.  Ext: 1+ edema, improved from previous exam  Imaging: Dg Chest Port 1 View  Result Date: 09/17/2016 CLINICAL DATA:  Acute respiratory failure EXAM: PORTABLE CHEST 1 VIEW COMPARISON:  09/16/2016 FINDINGS: Endotracheal tube with the tip 3 cm above the carina. Nasogastric tube coursing below the diaphragm. Right jugular central venous catheter with the tip projecting over the cavoatrial junction. Bilateral diffuse interstitial thickening. Possible trace bilateral pleural effusions. No pneumothorax. Stable cardiomegaly. IMPRESSION: 1. Support lines and tubing in satisfactory position. 2. Mild CHF. Electronically Signed   By: Kathreen Devoid   On: 09/17/2016 08:08   Dg Chest Port 1 View  Result Date: 09/16/2016 CLINICAL DATA:  Hypoxia EXAM: PORTABLE CHEST 1 VIEW COMPARISON:  September 15, 2016 FINDINGS: Endotracheal tube tip is 3.3 cm above the carina. Right jugular catheter tip is in the superior vena cava near the cavoatrial junction. Left jugular catheter tip is in the left innominate vein. Nasogastric tube tip and side port are below the diaphragm. No pneumothorax. There remains consolidation in portions of the right lower lobe, essentially stable. There is a small right pleural effusion. Left lung is clear.  There is cardiomegaly with pulmonary venous hypertension. No adenopathy. No evident bone lesions. IMPRESSION: Tube and catheter positions as described without evident pneumothorax. Persistent consolidation throughout portions of the right lower lobe with small right pleural effusion. Left lung clear. Evidence of underlying pulmonary vascular congestion. Electronically Signed   By: Lowella Grip III M.D.   On: 09/16/2016 07:12   Dg Chest Port 1 View  Result Date: 09/15/2016 CLINICAL DATA:  26 year old male currently intubated EXAM: PORTABLE CHEST 1 VIEW COMPARISON:  Prior chest x-ray 09/15/2016 at 15:26  p.m. FINDINGS: The patient is intubated. The tip of the endotracheal tube is 3.4 cm above the carina. Left IJ approach central venous catheter with the tip overlying the left innominate vein. Right IJ approach non tunneled hemodialysis catheter with the tip at the level of the mid SVC. Incompletely imaged gastric tube. The tip lies below the diaphragm, likely within the stomach. Marked cardiomegaly. Improved aeration of the right lung likely secondary to resolution of mucous plugging. There is still some persistent right lower lobe atelectasis. The left lung remains relatively clear. No evidence of pneumothorax. No acute osseous abnormality. IMPRESSION: 1. Improving aeration in the right lung likely secondary to resolution of right mainstem bronchus mucous plugging. There is still persistent atelectasis and airspace opacity throughout the right mid and the lower lobe. 2. Stable support apparatus. Electronically Signed   By: Jacqulynn Cadet M.D.   On: 09/15/2016 17:14   Dg Chest Port 1 View  Result Date: 09/15/2016 CLINICAL DATA:  Hypoxia EXAM: PORTABLE CHEST 1 VIEW COMPARISON:  Study obtained earlier in the day FINDINGS: Endotracheal tube tip is 4.3 cm above the carina. Central catheter tip is in the superior vena cava. Nasogastric tube tip and side port are below the diaphragm. No pneumothorax. There is abrupt termination of the proximal right main bronchus with diffuse opacification of the right lung. Left lung is clear. Heart size is normal. No adenopathy is appreciable in areas that can be assessed. No bone lesions are appreciable. IMPRESSION: Tube and catheter positions as described without pneumothorax. Abrupt termination of the proximal right main bronchus with diffuse opacification on the right. Suspect mucous plug in the proximal right main bronchus. There may also be a degree of pleural effusion and consolidation on the right. Left lung clear. These results will be called to the ordering clinician or  representative by the Radiologist Assistant, and communication documented in the PACS or zVision Dashboard. Electronically Signed   By: Lowella Grip III M.D.   On: 09/15/2016 15:43    Labs: BMET  Recent Labs Lab 09/13/16 1513 09/14/16 0307 09/14/16 1515 09/15/16 0417 09/15/16 1659 09/16/16 0420 09/16/16 1933 09/17/16 0520  NA 135 134* 134* 133* 136 134* 134* 132*  K 4.9 5.0 5.4* 5.1 5.5* 5.2* 4.4 4.2  CL 102 100* 99* 99* 100* 99* 91* 92*  CO2 23 24 24 23 24  21* 26 23  GLUCOSE 106* 106* 151* 116* 142* 124* 140* 98  BUN 46* 41* 33* 32* 30* 37* 22* 34*  CREATININE 5.41* 4.87* 4.64* 4.42* 4.16* 5.26* 3.76* 5.09*  CALCIUM 6.4* 6.7* 7.0* 6.9* 7.4* 7.1* 7.7* 7.0*  PHOS 5.7* 6.6* 6.2* 5.8* 7.1* 5.0* 5.1*  --    CBC  Recent Labs Lab 09/11/16 0548  09/14/16 0307 09/15/16 0417 09/16/16 0420 09/17/16 0520  WBC 14.6*  < > 12.5* 15.8* 18.3* 15.6*  NEUTROABS 12.8*  --   --   --   --   --   HGB 7.0*  < >  9.3* 9.3* 8.9* 8.9*  HCT 21.7*  < > 28.9* 29.9* 27.7* 27.7*  MCV 72.6*  < > 78.5 80.2 78.9 77.4*  PLT 273  < > 242 232 208 266  < > = values in this interval not displayed.  Medications:    . acetylcysteine  4 mL Nebulization BID  . albuterol  2.5 mg Nebulization Q4H  . amLODipine  10 mg Per Tube Daily  . chlorhexidine gluconate (MEDLINE KIT)  15 mL Mouth Rinse BID  . Chlorhexidine Gluconate Cloth  6 each Topical Daily  . cloNIDine  0.1 mg Transdermal Weekly  . doxercalciferol  4 mcg Intravenous Once  . feeding supplement (PRO-STAT SUGAR FREE 64)  60 mL Per Tube TID  . feeding supplement (VITAL HIGH PROTEIN)  1,000 mL Per Tube Q24H  . insulin aspart  1-3 Units Subcutaneous Q4H  . levothyroxine  75 mcg Intravenous Daily  . mouth rinse  15 mL Mouth Rinse QID  . pantoprazole sodium  40 mg Per Tube QHS  . sodium chloride flush  10-40 mL Intracatheter Q12H    Hyman Bible, MD PGY-2 Family Medicine Resident 09/17/16  1:32 PM  I have seen and examined this patient and  agree with plan and assessment in the above note with renal recommendations/intervention highlighted.  Will plan for another session of HD to help with pulmonary status.  Electrolytes stable.  Plan for HD again on Monday. Broadus John A Alexsus Papadopoulos,MD 09/17/2016 2:13 PM

## 2016-09-18 ENCOUNTER — Inpatient Hospital Stay (HOSPITAL_COMMUNITY): Payer: Medicaid Other

## 2016-09-18 LAB — CBC WITH DIFFERENTIAL/PLATELET
BASOS ABS: 0.1 10*3/uL (ref 0.0–0.1)
Basophils Relative: 1 %
EOS ABS: 0.6 10*3/uL (ref 0.0–0.7)
EOS PCT: 4 %
HCT: 30 % — ABNORMAL LOW (ref 39.0–52.0)
HEMOGLOBIN: 9.8 g/dL — AB (ref 13.0–17.0)
Lymphocytes Relative: 8 %
Lymphs Abs: 1.2 10*3/uL (ref 0.7–4.0)
MCH: 25.3 pg — AB (ref 26.0–34.0)
MCHC: 32.7 g/dL (ref 30.0–36.0)
MCV: 77.5 fL — ABNORMAL LOW (ref 78.0–100.0)
Monocytes Absolute: 2.3 10*3/uL — ABNORMAL HIGH (ref 0.1–1.0)
Monocytes Relative: 15 %
NEUTROS PCT: 72 %
Neutro Abs: 11.1 10*3/uL — ABNORMAL HIGH (ref 1.7–7.7)
Platelets: 289 10*3/uL (ref 150–400)
RBC: 3.87 MIL/uL — AB (ref 4.22–5.81)
RDW: 18 % — ABNORMAL HIGH (ref 11.5–15.5)
WBC: 15.3 10*3/uL — AB (ref 4.0–10.5)

## 2016-09-18 LAB — GLUCOSE, CAPILLARY
GLUCOSE-CAPILLARY: 170 mg/dL — AB (ref 65–99)
GLUCOSE-CAPILLARY: 151 mg/dL — AB (ref 65–99)
Glucose-Capillary: 131 mg/dL — ABNORMAL HIGH (ref 65–99)
Glucose-Capillary: 131 mg/dL — ABNORMAL HIGH (ref 65–99)
Glucose-Capillary: 149 mg/dL — ABNORMAL HIGH (ref 65–99)
Glucose-Capillary: 153 mg/dL — ABNORMAL HIGH (ref 65–99)

## 2016-09-18 LAB — BASIC METABOLIC PANEL
ANION GAP: 16 — AB (ref 5–15)
BUN: 36 mg/dL — AB (ref 6–20)
CO2: 25 mmol/L (ref 22–32)
CREATININE: 5.39 mg/dL — AB (ref 0.61–1.24)
Calcium: 7.7 mg/dL — ABNORMAL LOW (ref 8.9–10.3)
Chloride: 94 mmol/L — ABNORMAL LOW (ref 101–111)
GFR calc non Af Amer: 14 mL/min — ABNORMAL LOW (ref >60)
GFR, EST AFRICAN AMERICAN: 16 mL/min — AB (ref >60)
GLUCOSE: 135 mg/dL — AB (ref 65–99)
Potassium: 4 mmol/L (ref 3.5–5.1)
SODIUM: 135 mmol/L (ref 135–145)

## 2016-09-18 LAB — PHOSPHORUS: PHOSPHORUS: 6.6 mg/dL — AB (ref 2.5–4.6)

## 2016-09-18 LAB — CULTURE, RESPIRATORY: CULTURE: NORMAL

## 2016-09-18 LAB — MAGNESIUM: MAGNESIUM: 2.5 mg/dL — AB (ref 1.7–2.4)

## 2016-09-18 LAB — PROCALCITONIN: PROCALCITONIN: 5.3 ng/mL

## 2016-09-18 LAB — ALBUMIN: Albumin: 2.6 g/dL — ABNORMAL LOW (ref 3.5–5.0)

## 2016-09-18 MED ORDER — DARBEPOETIN ALFA 100 MCG/0.5ML IJ SOSY
100.0000 ug | PREFILLED_SYRINGE | INTRAMUSCULAR | Status: DC
  Administered 2016-09-19 – 2016-09-26 (×2): 100 ug via INTRAVENOUS
  Filled 2016-09-18 (×3): qty 0.5

## 2016-09-18 NOTE — Progress Notes (Signed)
[facility] Progress Note    Assessment/ Plan:   Acute and/or chronic renal failure: improving- he had stage III CKD in 2016 with creat 1.8. Serum Cr 13.5 (on admit).  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  Very low Ca could argue for chronic renal failure. Autoimmune labs negative except for positive ANA and mildly elevated RF. IV fluid 1.5 L. Took off 3L with HD yesterday. Net -1.5 L in the last 24 hour period and -25L since admit. Blood pressure on lower side of normal this morning. There is hemoconcentration as well.   Plan for another session of HD 4/30.   Follow up other autoimmune labs  Daily renal function  Hypocalcemia: Improved. Calcium 7.7 (corrects to 8.8). PTH 156.  Continue hectrol   Daily renal function panel  Met acidosis: Resolved with HD. Likely due to renal failure.   Monitor with renal function panel  Acute Resp Failure: on PS   On PS  Per primary  Sepsis: continues to be tachycardic and febrile. Blood pressure on the lower side of normal. Blood cultures from 4/27 with no growth  On Vanc and Zosyn by primary  Microcytic anemia: s/p 4 units. Hgb from 8.9 up to 9.6 likely hemoconcentration. Anemia panel within normal. Iron sat 43%. Hemoglobinopathy with sickle cell trait  Daily H&H  HTN: normotensive but on the low side  Discontinue labetalol patch  On Amlodipine 10 mg daily and labetalol as needed  DM2: last A1c  9.6 in 2016. Current A1c unreliable (after transfusion)  On insulin per primary   Subjective:    Awake and following commands this morning. Did well with HD yesterday. Family at bedside.    Objective:   BP 123/80   Pulse (!) 107   Temp (!) 100.4 F (38 C)   Resp 18   Ht _0  (1.88 m)   Wt (!) 343 lb 14.7 oz (156 kg)   SpO2 99%   BMI 44.16 kg/m   Intake/Output Summary (Last 24 hours) at 09/18/16 0657 Last data filed at 09/18/16 0600  Gross per 24 hour  Intake            1585.8 ml  Output             3035 ml  Net          -1449.2 ml   Weight change: -11 lb 0.4 oz (-5 kg)  Physical Exam: Gen: Awake, alert, in NAD CVS:RRR, no edema Resp: breathing comfortably on PS, lungs with mild rhonchi but otherwise clear. Abd: obese and soft. Bowel sounds normal Ext: no edema  Imaging: Dg Chest Port 1 View  Result Date: 09/17/2016 CLINICAL DATA:  Acute respiratory failure EXAM: PORTABLE CHEST 1 VIEW COMPARISON:  09/16/2016 FINDINGS: Endotracheal tube with the tip 3 cm above the carina. Nasogastric tube coursing below the diaphragm. Right jugular central venous catheter with the tip projecting over the cavoatrial junction. Bilateral diffuse interstitial thickening. Possible trace bilateral pleural effusions. No pneumothorax. Stable cardiomegaly. IMPRESSION: 1. Support lines and tubing in satisfactory position. 2. Mild CHF. Electronically Signed   By: Kathreen Devoid   On: 09/17/2016 08:08    Labs: BMET  Recent Labs Lab 09/13/16 1513 09/14/16 8676 09/14/16 1515 09/15/16 0417 09/15/16 1659 09/16/16 0420 09/16/16 1933 09/17/16 0520 09/18/16 0434  NA 135 134* 134* 133* 136 134* 134* 132* 135  K 4.9 5.0 5.4* 5.1 5.5* 5.2* 4.4 4.2 4.0  CL 102 100* 99* 99* 100*  99* 91* 92* 94*  CO2 _0 21* _1 GLUCOSE 106* 106* 151* 116* 142* 124* 140* 98 135*  BUN 46* 41* 33* 32* 30* 37* 22* 34* 36*  CREATININE 5.41* 4.87* 4.64* 4.42* 4.16* 5.26* 3.76* 5.09* 5.39*  CALCIUM 6.4* 6.7* 7.0* 6.9* 7.4* 7.1* 7.7* 7.0* 7.7*  PHOS 5.7* 6.6* 6.2* 5.8* 7.1* 5.0* 5.1*  --   --    CBC  Recent Labs Lab 09/15/16 0417 09/16/16 0420 09/17/16 0520 09/18/16 0434  WBC 15.8* 18.3* 15.6* 15.3*  NEUTROABS  --   --   --  11.1*  HGB 9.3* 8.9* 8.9* 9.8*  HCT 29.9* 27.7* 27.7* 30.0*  MCV 80.2 78.9 77.4* 77.5*  PLT 232 208 266 289    Medications:    . acetylcysteine  4 mL Nebulization BID  . albuterol  2.5 mg Nebulization Q4H  . amLODipine  10 mg Per Tube Daily  .  chlorhexidine gluconate (MEDLINE KIT)  15 mL Mouth Rinse BID  . Chlorhexidine Gluconate Cloth  6 each Topical Daily  . cloNIDine  0.1 mg Transdermal Weekly  . doxercalciferol  4 mcg Intravenous Once  . feeding supplement (PRO-STAT SUGAR FREE 64)  60 mL Per Tube TID  . feeding supplement (VITAL HIGH PROTEIN)  1,000 mL Per Tube Q24H  . insulin aspart  1-3 Units Subcutaneous Q4H  . levothyroxine  75 mcg Intravenous Daily  . mouth rinse  15 mL Mouth Rinse QID  . pantoprazole sodium  40 mg Per Tube QHS  . sodium chloride flush  10-40 mL Intracatheter Q12H    Wendee Beavers, MD.  PGY-2 Pager [phone] 09/18/16  6:57 AM  I have seen and examined this patient and agree with plan and assessment in the above note with renal recommendations/intervention highlighted.  More awake and alert this am.  Hold off on HD as volume status and electrolytes are stable.  Plan for HD qMWF and follow.  Would try to avoid trach at all costs as he is likely ESRD and placement will be a major issue.  WBC and temp remain elevated, work up per [employer].  He will need tunneled HD cath and AVF/AVG in the near future. Governor Rooks Brehanna Deveny,MD 09/18/2016 12:54 PM

## 2016-09-18 NOTE — Progress Notes (Signed)
PULMONARY  / CRITICAL CARE MEDICINE  Name: Paul [name] MRN: [id number] DOB: 14-Dec-1990    LOS: 57  REFERRING MD :  ED  CHIEF COMPLAINT:  "Cant breathe "   brief  26 yo male with DM , HTN , Hyperlipidemia , Renal Insufficiency and Morbid obesity presented to ER via EMS emergently with confusion and respiratory distress . On arrival found to have BS at 57. Given D50 by EMS . Pt w/ hypoxia,  tachypnea , temp 94. and in resp distress placed on BIPAP support. Says has had cold/bronchitic symptoms for last 2 weeks with cough/congestion . Seen by PCP 1 week ago, given Zpack with no improvement and progression of sob with lower extremity swelling. Labs returned showed severe acidosis with pH at 7.118, PCO2 43, PO 44, HCO3 14. Hbg 6.1, K 6.9 , Scr 15  . CXR showed diffuse interstitial marking ? puml edema . Lactic acid 0.45 . Troponin neg.  D Dimer 17. Pt was started on Vanc and Zosyn . b/p elevated on arrival , w/ no hypotension. In ER he was given lasix 40mg  x 1 , solumedrol x 1  . Given D50 x 2 , started on HCO3 drip , calcium given and albuterol given . On arrival to ER , pt in severe resp distress on BIPAP . Still alert but very agitated with increased wob, accessory use and tachypnea . Pt will require emergent intubation and Hemodialysis . Mother and Sister at bedside with update given .     LINES / TUBES: 4/22 ETT >> 4/22 HD cath >> 4/22 L IJ >>  CULTURES: 4/22 BC x 2 >> 4/22 Sputum CX >> 4/22 Viral panel negative 4/26 Sputum Cx: moderate GNR, GPC, few GPR 4/27 BC x 2 >>  ANTIBIOTICS: 4/22 Vanc/Zosyn x 1 4/22 Rocephin >> 4/23 4/22 Levaquin >> 4/25 4/27 Vanc/Zosyn >>  SIGNIFICANT EVENTS:  4/23: Intubated and dialyzed. 4/26: Self-extubated but reintubated complicated by mucus plugging of right mainstem bronchus. CRRT stopped.  4/27: Switched to intermittent HD.  STUDIES:  Echo 4/22 EF 60-65%, moderate-sized pericardial effusion Renal US 4/23: Medical renal disease. CXR 4/23:  Improved edem   SUBJECTIVE/OVERNIGHT/INTERVAL HX This morning, he is awake and alert off sedation. PS 10 over PEEP 8.   VITAL SIGNS: Temp:  [99.3 F (37.4 C)-100.9 F (38.3 C)] 100.4 F (38 C) (04/29 0700) Pulse Rate:  [96-121] 107 (04/29 0700) Resp:  [17-36] 18 (04/29 0700) BP: (107-161)/(60-103) 130/85 (04/29 0700) SpO2:  [95 %-100 %] 99 % (04/29 0711) Arterial Line BP: (129-165)/(57-69) 165/69 (04/28 1000) FiO2 (%):  [40 %] 40 % (04/29 0711) Weight:  [155 kg (341 lb 11.4 oz)-158 kg (348 lb 5.2 oz)] 156 kg (343 lb 14.7 oz) (04/29 0430)   HEMODYNAMICS: CVP:  [10 mmHg] 10 mmHg  VENTILATOR SETTINGS: Vent Mode: PRVC FiO2 (%):  [40 %] 40 % Set Rate:  [18 bmp] 18 bmp Vt Set:  [420 mL] 420 mL PEEP:  [8 cmH20] 8 cmH20 Pressure Support:  [10 cmH20] 10 cmH20 Plateau Pressure:  [22 cmH20-24 cmH20] 22 cmH20  INTAKE / OUTPUT: Intake/Output      04/28 0701 - 04/29 0700 04/29 0701 - 04/30 0700   I.V. (mL/kg) 200.4 (1.3)    NG/GT 1270    IV Piggyback 100    Total Intake(mL/kg) 1570.4 (10.1)    Urine (mL/kg/hr) 35 (0)    Other 3000 (0.8)    Total Output 3035     Net -1464.6  PHYSICAL EXAMINATION: Physical Exam  Constitutional: He is well-developed, well-nourished, and in no distress. No distress.  obese  HENT:  Head: Normocephalic and atraumatic.  ETT in place  Eyes: Conjunctivae are normal. Right eye exhibits no discharge. Left eye exhibits no discharge. No scleral icterus.  Neck: Neck supple. No JVD present.  L IJ cath, R HD cath in place  Cardiovascular: Normal rate and regular rhythm.   Pulmonary/Chest: Effort normal and breath sounds normal. No respiratory distress.  Abdominal: Soft. He exhibits no distension and no mass. There is no tenderness. There is no rebound and no guarding.  Musculoskeletal: He exhibits edema.  Neurological:  Wiggles toes on command  Skin: He is not diaphoretic.      LABS: PULMONARY  Recent Labs Lab 09/12/16 0534   09/12/16 2002  09/13/16 0935 09/15/16 1416 09/15/16 1648 09/16/16 0400 09/16/16 1129  PHART 7.540*  < > 7.406  < > 7.363 7.150* 7.263* 7.389 7.398  PCO2ART 19.7*  < > 37.2  < > 41.5 74.7* 57.0* 36.5 34.2  PO2ART 99.0  < > 86.0  < > 72.1* 67.0* 70.0* 184* 127.0*  HCO3 17.0*  < > 23.4  < > 23.0 26.4 25.8 21.6 20.9  TCO2 18  --  25  --   --  29 27  --  22  O2SAT 99.0  < > 97.0  < > 93.7 88.0 91.0 99.1 99.0  < > = values in this interval not displayed.  CBC  Recent Labs Lab 09/16/16 0420 09/17/16 0520 09/18/16 0434  HGB 8.9* 8.9* 9.8*  HCT 27.7* 27.7* 30.0*  WBC 18.3* 15.6* 15.3*  PLT 208 266 289    COAGULATION  Recent Labs Lab 09/11/16 0819  INR 1.44    CARDIAC    Recent Labs Lab 09/11/16 0819  TROPONINI <0.03   No results for input(s): PROBNP in the last 168 hours.   CHEMISTRY  Recent Labs Lab 09/13/16 0415  09/14/16 0307  09/15/16 0417 09/15/16 1659 09/16/16 0420 09/16/16 1933 09/17/16 0520 09/18/16 0434  NA  --   < > 134*  < > 133* 136 134* 134* 132* 135  K  --   < > 5.0  < > 5.1 5.5* 5.2* 4.4 4.2 4.0  CL  --   < > 100*  < > 99* 100* 99* 91* 92* 94*  CO2  --   < > 24  < > 23 24 21* 26 23 25   GLUCOSE  --   < > 106*  < > 116* 142* 124* 140* 98 135*  BUN  --   < > 41*  < > 32* 30* 37* 22* 34* 36*  CREATININE  --   < > 4.87*  < > 4.42* 4.16* 5.26* 3.76* 5.09* 5.39*  CALCIUM  --   < > 6.7*  < > 6.9* 7.4* 7.1* 7.7* 7.0* 7.7*  MG 2.1  --  2.2  --  2.3  --  2.3  --   --  2.5*  PHOS  --   < > 6.6*  < > 5.8* 7.1* 5.0* 5.1*  --  6.6*  < > = values in this interval not displayed. Estimated Creatinine Clearance: 33.4 mL/min (A) (by C-G formula based on SCr of 5.39 mg/dL (H)).   LIVER  Recent Labs Lab 09/11/16 1093  09/15/16 0417 09/15/16 1659 09/16/16 0420 09/16/16 1933 09/18/16 0434  AST 43*  --   --   --   --   --   --  ALT 33  --   --   --   --   --   --   ALKPHOS 82  --   --   --   --   --   --   BILITOT 0.7  --   --   --   --   --   --    PROT 6.7  --   --   --   --   --   --   ALBUMIN 2.6*  < > 2.6* 2.6* 2.4* 2.7* 2.6*  INR 1.44  --   --   --   --   --   --   < > = values in this interval not displayed.   INFECTIOUS  Recent Labs Lab 09/11/16 0823 09/11/16 1534  09/16/16 0420 09/17/16 0520 09/18/16 0434  LATICACIDVEN 0.8 0.6  --   --   --   --   PROCALCITON  --   --   < > 4.69 5.64 5.30  < > = values in this interval not displayed.   ENDOCRINE CBG (last 3)   Recent Labs  09/17/16 2340 09/18/16 0431 09/18/16 0738  GLUCAP 132* 131* 153*      cxr - slowly improvnig   IMAGING x48h  - image(s) personally visualized  -   highlighted in bold Dg Chest Port 1 View  Result Date: 09/18/2016 CLINICAL DATA:  Acute respiratory failure. Endotracheal tube present. EXAM: PORTABLE CHEST 1 VIEW COMPARISON:  09/17/2016 FINDINGS: Support lines and tubes remain in stable position. Cardiomegaly stable. Bilateral pulmonary airspace disease and low lung volumes show no significant change. There is persistent collapse or consolidation in the left retrocardiac lung base. No pneumothorax visualized. IMPRESSION: No significant interval change compared to prior exam. Electronically Signed   By: Earle Gell M.D.   On: 09/18/2016 07:22   Dg Chest Port 1 View  Result Date: 09/17/2016 CLINICAL DATA:  Acute respiratory failure EXAM: PORTABLE CHEST 1 VIEW COMPARISON:  09/16/2016 FINDINGS: Endotracheal tube with the tip 3 cm above the carina. Nasogastric tube coursing below the diaphragm. Right jugular central venous catheter with the tip projecting over the cavoatrial junction. Bilateral diffuse interstitial thickening. Possible trace bilateral pleural effusions. No pneumothorax. Stable cardiomegaly. IMPRESSION: 1. Support lines and tubing in satisfactory position. 2. Mild CHF. Electronically Signed   By: Kathreen Devoid   On: 09/17/2016 08:08     DIAGNOSES: Active Problems:   Acute respiratory failure (HCC)   Pressure injury of skin    Acute renal failure (HCC)   Metabolic acidosis   Acute pulmonary edema (HCC)   Respiratory distress  ASSESSMENT / PLAN:  PULMONARY A: Acute hypoxic respiratory failure with pulmonary edema w/ BIPAP failure  Mucus plugging of right mainstem bronchus  P:   Continue vent support today though will again try to wean today    CARDIOVASCULAR A:  HTN: BP improved. Moderate-sized pericardial effusion: Suspect uremia-related which should improve with dialysis.   P:  Continue home amlodipine 10 mg, clonidine 0.1 mg patch, and labetalol as needed Repeat echo on 4/29 to reassess effusion size   RENAL A:   Acute Renal Failure: Now in intermittent HD. ANA+ on AI workup raises concern for drug-induced SLE, mCTD, Sjogren's, systemic sclerosis though workup thus far reassuring. RF+ is non-specific. Metabolic Acidosis: Resolved.  Hyperkalemia: Resolved Hypocalcemia: Resolved. Corrected Ca 8.8. Hyperphosphatemia: P 6.6.  P:   Follow BMET HD per Renal   GASTROINTESTINAL A: No acute issues.  P:  PPI  Continue tube feeds and bowel regimen   HEMATOLOGIC A:   Anemia of chronic disease: Hb 9 this AM. Sickle cell trait P:  Follow CBC   INFECTIOUS A: Sepsis: PCT uptrending. CRRT may have masked his fever as curve peaked at 19F since 4/23. Leukocytosis stable at 15,000.  P:   Follow repeat cultures Vanc/Zosyn for empiric coverage   ENDOCRINE A:   DM  Metabolic bone disease: Severe Vit D 6.8. PTH 159.    P:   SSI Vit D per Nephrology. Received doxercalciferol on 4/22.    NEUROLOGIC A:   Acute encephalopathy: Multifactorial in the setting of acidosis and sepsis though resolved. P:   RASS goal: -1 Continue propofol gtt and fentanyl prn to minimize volume   Charlott Rakes, PGY3 Internal Medicine Pager: [phone] 09/18/2016 7:59 AM

## 2016-09-19 ENCOUNTER — Inpatient Hospital Stay (HOSPITAL_COMMUNITY): Payer: Medicaid Other

## 2016-09-19 DIAGNOSIS — I361 Nonrheumatic tricuspid (valve) insufficiency: Secondary | ICD-10-CM

## 2016-09-19 DIAGNOSIS — J189 Pneumonia, unspecified organism: Secondary | ICD-10-CM

## 2016-09-19 DIAGNOSIS — Y95 Nosocomial condition: Secondary | ICD-10-CM

## 2016-09-19 LAB — GLUCOSE, CAPILLARY
GLUCOSE-CAPILLARY: 132 mg/dL — AB (ref 65–99)
GLUCOSE-CAPILLARY: 165 mg/dL — AB (ref 65–99)
GLUCOSE-CAPILLARY: 148 mg/dL — AB (ref 65–99)
GLUCOSE-CAPILLARY: 126 mg/dL — AB (ref 65–99)
Glucose-Capillary: 139 mg/dL — ABNORMAL HIGH (ref 65–99)
Glucose-Capillary: 155 mg/dL — ABNORMAL HIGH (ref 65–99)

## 2016-09-19 LAB — CBC WITH DIFFERENTIAL/PLATELET
BASOS PCT: 0 %
Basophils Absolute: 0 10*3/uL (ref 0.0–0.1)
Eosinophils Absolute: 0.7 10*3/uL (ref 0.0–0.7)
Eosinophils Relative: 4 %
HCT: 29.3 % — ABNORMAL LOW (ref 39.0–52.0)
HEMOGLOBIN: 9.5 g/dL — AB (ref 13.0–17.0)
LYMPHS PCT: 10 %
Lymphs Abs: 1.8 10*3/uL (ref 0.7–4.0)
MCH: 25.1 pg — AB (ref 26.0–34.0)
MCHC: 32.4 g/dL (ref 30.0–36.0)
MCV: 77.5 fL — AB (ref 78.0–100.0)
Monocytes Absolute: 2 10*3/uL — ABNORMAL HIGH (ref 0.1–1.0)
Monocytes Relative: 11 %
NEUTROS PCT: 75 %
Neutro Abs: 13.8 10*3/uL — ABNORMAL HIGH (ref 1.7–7.7)
Platelets: 290 10*3/uL (ref 150–400)
RBC: 3.78 MIL/uL — ABNORMAL LOW (ref 4.22–5.81)
RDW: 18 % — ABNORMAL HIGH (ref 11.5–15.5)
WBC: 18.3 10*3/uL — ABNORMAL HIGH (ref 4.0–10.5)

## 2016-09-19 LAB — RENAL FUNCTION PANEL
Albumin: 2.6 g/dL — ABNORMAL LOW (ref 3.5–5.0)
Anion gap: 23 — ABNORMAL HIGH (ref 5–15)
BUN: 80 mg/dL — ABNORMAL HIGH (ref 6–20)
CHLORIDE: 91 mmol/L — AB (ref 101–111)
CO2: 23 mmol/L (ref 22–32)
Calcium: 7.7 mg/dL — ABNORMAL LOW (ref 8.9–10.3)
Creatinine, Ser: 8.32 mg/dL — ABNORMAL HIGH (ref 0.61–1.24)
GFR, EST AFRICAN AMERICAN: 9 mL/min — AB (ref >60)
GFR, EST NON AFRICAN AMERICAN: 8 mL/min — AB (ref >60)
Glucose, Bld: 141 mg/dL — ABNORMAL HIGH (ref 65–99)
POTASSIUM: 3.9 mmol/L (ref 3.5–5.1)
Phosphorus: 8.8 mg/dL — ABNORMAL HIGH (ref 2.5–4.6)
Sodium: 137 mmol/L (ref 135–145)

## 2016-09-19 LAB — TROPONIN I
Troponin I: <0.03 ng/mL (ref <0.03)
Troponin I: 0.03 ng/mL (ref <0.03)

## 2016-09-19 LAB — MAGNESIUM: Magnesium: 2.7 mg/dL — ABNORMAL HIGH (ref 1.7–2.4)

## 2016-09-19 MED ORDER — VANCOMYCIN HCL IN DEXTROSE 1-5 GM/200ML-% IV SOLN
1000.0000 mg | INTRAVENOUS | Status: DC | PRN
  Filled 2016-09-19: qty 200

## 2016-09-19 MED ORDER — VANCOMYCIN HCL IN DEXTROSE 1-5 GM/200ML-% IV SOLN
1000.0000 mg | Freq: Once | INTRAVENOUS | Status: AC
  Administered 2016-09-19: 1000 mg via INTRAVENOUS
  Filled 2016-09-19: qty 200

## 2016-09-19 MED ORDER — HEPARIN SODIUM (PORCINE) 1000 UNIT/ML DIALYSIS: 20.0000 [IU]/kg | INTRAMUSCULAR | Status: DC | PRN

## 2016-09-19 MED ORDER — HEPARIN SODIUM (PORCINE) 5000 UNIT/ML IJ SOLN
5000.0000 [IU] | Freq: Three times a day (TID) | INTRAMUSCULAR | Status: DC
  Administered 2016-09-19 – 2016-09-26 (×22): 5000 [IU] via SUBCUTANEOUS
  Filled 2016-09-19 (×22): qty 1

## 2016-09-19 MED ORDER — ACETAMINOPHEN 160 MG/5ML PO SOLN
650.0000 mg | Freq: Four times a day (QID) | ORAL | Status: DC | PRN
  Administered 2016-09-19 (×2): 650 mg
  Filled 2016-09-19 (×2): qty 20.3

## 2016-09-19 NOTE — Progress Notes (Signed)
  Echocardiogram 2D Echocardiogram has been performed.  Paul [name] [name] 09/19/2016, 5:36 PM

## 2016-09-19 NOTE — Progress Notes (Signed)
[facility] Progress Note    Assessment/ Plan:   Acute and/or chronic renal failure: improving- he had stage III CKD in 2016 with creat 1.8. Serum Cr 13.5 (on admit).  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  Autoimmune labs negative except for positive ANA and mildly elevated RF. Intake 1.3L. Output was none. -23L since admit. Blood pressure on lower side of normal this morning. Serum creatinine up to 8.32 and BUN 80.   HD session today. Plan for qMWF and follow.    Would try to avoid trach at all costs as he is likely ESRD and placement will be a major issue  Anti-Histone antibody pending  Daily renal function  Hypocalcemia: Improved. Calcium 7.7 (corrects to 8.8). PTH 156.  Continue hectrol   Daily renal function panel  Met acidosis: Resolved with HD. Likely due to renal failure.   Monitor with renal function panel  Acute Resp Failure: on PS   On PS  Per primary  Sepsis: continues to be tachycardic and febrile. Blood pressure on the lower side of normal. Blood cultures from 4/27 with no growth  On Vanc and Zosyn by primary  Microcytic anemia: s/p 4 units. Hgb from 8.9 up to 9.6 likely hemoconcentration. Anemia panel within normal. Iron sat 43%. Hemoglobinopathy with sickle cell trait  Daily H&H  HTN: normotensive but on the low side  Discontinue labetalol patch  On Amlodipine 10 mg daily and labetalol as needed  DM2: last A1c  9.6 in 2016. Current A1c unreliable (after transfusion)  On insulin per primary   Subjective:    Awake and following commands this morning. On pressure support. Sister at bedside.   Objective:   BP 100/66   Pulse (!) 114   Temp 100.2 F (37.9 C)   Resp (!) 27   Ht 6' 2"  (1.88 m)   Wt (!) 350 lb 8.5 oz (159 kg)   SpO2 99%   BMI 45.01 kg/m   Intake/Output Summary (Last 24 hours) at 09/19/16 1117 Last data filed at 09/19/16 1000  Gross per 24 hour  Intake             1480 ml   Output               21 ml  Net             1459 ml   Weight change: -2 lb 3.3 oz (-1 kg)  Physical Exam: Gen: Awake, alert, in NAD CVS:RRR, no edema Resp: breathing comfortably on PS, lungs with mild rhonchi but otherwise clear. Abd: obese and soft. Bowel sounds normal Ext: no edema  Imaging: Dg Chest Port 1 View  Result Date: 09/18/2016 CLINICAL DATA:  Acute respiratory failure. Endotracheal tube present. EXAM: PORTABLE CHEST 1 VIEW COMPARISON:  09/17/2016 FINDINGS: Support lines and tubes remain in stable position. Cardiomegaly stable. Bilateral pulmonary airspace disease and low lung volumes show no significant change. There is persistent collapse or consolidation in the left retrocardiac lung base. No pneumothorax visualized. IMPRESSION: No significant interval change compared to prior exam. Electronically Signed   By: Earle Gell M.D.   On: 09/18/2016 07:22   Dg Chest Port 1v Same Day  Result Date: 09/19/2016 CLINICAL DATA:  Acute respiratory failure, pulmonary edema, diabetes. EXAM: PORTABLE CHEST 1 VIEW COMPARISON:  Portable chest x-ray of September 18, 2016 FINDINGS: The radiograph is obtained in a lordotic position. There has been interval development of subsegmental atelectasis in the inferior aspect  of the right upper lobe. There is persistent increased density in the retrocardiac region on the left. The cardiac silhouette remains enlarged. The pulmonary vascularity is slightly less conspicuous today. There is no pleural effusion. The esophagogastric tube tip projects below the inferior margin of the image. The endotracheal tube tip lies just below the inferior margin of the clavicular heads. IMPRESSION: Interval development of subsegmental atelectasis in the right upper lobe. Persistent left basilar atelectasis or pneumonia. Stable cardiomegaly with only mild pulmonary interstitial edema. Electronically Signed   By: David  Martinique M.D.   On: 09/19/2016 08:31    Labs: BMET  Recent  Labs Lab 09/14/16 1515 09/15/16 0417 09/15/16 1659 09/16/16 0420 09/16/16 1933 09/17/16 0520 09/18/16 0434 09/19/16 0413  NA 134* 133* 136 134* 134* 132* 135 137  K 5.4* 5.1 5.5* 5.2* 4.4 4.2 4.0 3.9  CL 99* 99* 100* 99* 91* 92* 94* 91*  CO2 24 23 24  21* 26 23 25 23   GLUCOSE 151* 116* 142* 124* 140* 98 135* 141*  BUN 33* 32* 30* 37* 22* 34* 36* 80*  CREATININE 4.64* 4.42* 4.16* 5.26* 3.76* 5.09* 5.39* 8.32*  CALCIUM 7.0* 6.9* 7.4* 7.1* 7.7* 7.0* 7.7* 7.7*  PHOS 6.2* 5.8* 7.1* 5.0* 5.1*  --  6.6* 8.8*   CBC  Recent Labs Lab 09/16/16 0420 09/17/16 0520 09/18/16 0434 09/19/16 0411  WBC 18.3* 15.6* 15.3* 18.3*  NEUTROABS  --   --  11.1* 13.8*  HGB 8.9* 8.9* 9.8* 9.5*  HCT 27.7* 27.7* 30.0* 29.3*  MCV 78.9 77.4* 77.5* 77.5*  PLT 208 266 289 290    Medications:    . acetylcysteine  4 mL Nebulization BID  . albuterol  2.5 mg Nebulization Q4H  . amLODipine  10 mg Per Tube Daily  . chlorhexidine gluconate (MEDLINE KIT)  15 mL Mouth Rinse BID  . Chlorhexidine Gluconate Cloth  6 each Topical Daily  . darbepoetin (ARANESP) injection - DIALYSIS  100 mcg Intravenous Q Mon-HD  . doxercalciferol  4 mcg Intravenous Once  . feeding supplement (PRO-STAT SUGAR FREE 64)  60 mL Per Tube TID  . feeding supplement (VITAL HIGH PROTEIN)  1,000 mL Per Tube Q24H  . heparin subcutaneous  5,000 Units Subcutaneous Q8H  . insulin aspart  1-3 Units Subcutaneous Q4H  . levothyroxine  75 mcg Intravenous Daily  . mouth rinse  15 mL Mouth Rinse QID  . pantoprazole sodium  40 mg Per Tube QHS  . sodium chloride flush  10-40 mL Intracatheter Q12H    Wendee Beavers, MD.  PGY-2 Pager [phone] 09/19/16  11:17 AM

## 2016-09-19 NOTE — Progress Notes (Signed)
Pt exhibited ST depression in one lead on cardiac monitor after bath. Resident MD Posey Pronto made aware. 12 lead EKG obtained with preliminary results showing potential ischemia in inferiolateral leads. Pt denies any chest pain. Attending MD made aware; orders given to trend troponin levels. Will continue to monitor for any EKG changes.

## 2016-09-19 NOTE — Progress Notes (Signed)
Pt had potential vomiting episode during mouth care (unclear as patient denies nausea but tube feed remnants were present in mouth after coughing). Tube feeds turned off, nutritionist and MD made aware. Will check gastric residual in four hours.

## 2016-09-19 NOTE — Progress Notes (Signed)
Pharmacy Antibiotic Note  Paul [name] is a 26 y.o. male admitted on 09/11/2016 with pneumonia.  Pharmacy has been consulted for vancomycin dosing.  HD last 4/28- tolerated full session 4 hrs at 350 cc.  HD 4/30 -currently tolerating her session  Plan: Vancomycin 1g IV post HD  Continue Zosyn 3.375g IV every 12 hours - 4 hour infusion Monitor clinical status, culture results, and HD schedule  Height: _0  (188 cm) Weight: (!) 350 lb 8.5 oz (159 kg) IBW/kg (Calculated) : 82.2  Temp (24hrs), Avg:100.3 F (37.9 C), Min:99.9 F (37.7 C), Max:100.8 F (38.2 C)   Recent Labs Lab 09/15/16 0417  09/16/16 0420 09/16/16 1933 09/17/16 0520 09/17/16 1302 09/18/16 0434 09/19/16 0411 09/19/16 0413  WBC 15.8*  --  18.3*  --  15.6*  --  15.3* 18.3*  --   CREATININE 4.42*  < > 5.26* 3.76* 5.09*  --  5.39*  --  8.32*  VANCORANDOM  --   --   --   --   --  45  --   --   --   < > = values in this interval not displayed.  Estimated Creatinine Clearance: 21.9 mL/min (A) (by C-G formula based on SCr of 8.32 mg/dL (H)).    No Known Allergies  Antimicrobials this admission: LVQ 4/22 >>4/25 CTX 4/22 >>4/23 Vancomycin 4/22 x1; restart 4/27 >> Zosyn 4/22 x1; restart 4/27 >>  Dose adjustments this admission: 4/28 Pre-HD vancomycin level: 45 (supratherapeutic; target preHD level 15-25) - no vanc given post HD ---Post HD level estimated to be (3.5hrs @ BFR 400) = ~29 4/30: Vancomycin 1g post HD today due to estimated post HD level ~16  Microbiology results: 4/22 RVP: NEG 4/22 Leg UAg: NEG 4/22 Strep pneum UAg -NEG 4/22 BCx x3: ngtd 4/22 UCx: NEG 4/23 TA: normal flora 4/22 MRSA - NEG 4/26 BAL Cx: normal resp flora 4/27 BCx: ngtd 4/27 HepB: Negative  Thank you for allowing pharmacy to be a part of this patient's care.  Sloan Leiter, PharmD, BCPS Clinical Pharmacist Clinical Phone 09/19/2016 until 3:30 PM - [phone] After hours, please call #[phone] 09/19/2016 1:36 PM

## 2016-09-19 NOTE — Progress Notes (Signed)
PULMONARY  / CRITICAL CARE MEDICINE  Name: Paul [name] MRN: [id number] DOB: [birth year]    LOS: 26  REFERRING MD :  ED  CHIEF COMPLAINT:  "Cant breathe "   brief  26 yo male with DM , HTN , Hyperlipidemia , Renal Insufficiency and Morbid obesity presented to ER via EMS emergently with confusion and respiratory distress . On arrival found to have BS at 33. Given D50 by EMS . Pt w/ hypoxia,  tachypnea , temp 94. and in resp distress placed on BIPAP support. Says has had cold/bronchitic symptoms for last 2 weeks with cough/congestion . Seen by PCP 1 week ago, given Zpack with no improvement and progression of sob with lower extremity swelling. Labs returned showed severe acidosis with pH at 7.118, PCO2 43, PO 44, HCO3 14. Hbg 6.1, K 6.9 , Scr 15  . CXR showed diffuse interstitial marking ? puml edema . Lactic acid 0.45 . Troponin neg.  D Dimer 17. Pt was started on Vanc and Zosyn . b/p elevated on arrival , w/ no hypotension. In ER he was given lasix 40mg  x 1 , solumedrol x 1  . Given D50 x 2 , started on HCO3 drip , calcium given and albuterol given . On arrival to ER , pt in severe resp distress on BIPAP . Still alert but very agitated with increased wob, accessory use and tachypnea . Pt will require emergent intubation and Hemodialysis . Mother and Sister at bedside with update given .     LINES / TUBES: 4/22 ETT >> 4/22 HD cath >> 4/22 L IJ >>  CULTURES: 4/22 BC x 2 >> 4/22 Sputum CX >> 4/22 Viral panel negative 4/26 Sputum Cx: moderate GNR, GPC, few GPR 4/27 BC x 2 >>  ANTIBIOTICS: 4/22 Vanc/Zosyn x 1 4/22 Rocephin >> 4/23 4/22 Levaquin >> 4/25 4/27 Vanc/Zosyn >>  SIGNIFICANT EVENTS:  4/23: Intubated and dialyzed. 4/26: Self-extubated but reintubated complicated by mucus plugging of right mainstem bronchus. CRRT stopped.  4/27: Switched to intermittent HD.  STUDIES:  Echo 4/22 EF 60-65%, moderate-sized pericardial effusion Renal US 4/23: Medical renal disease. CXR 4/23:  Improved edem   SUBJECTIVE/OVERNIGHT/INTERVAL HX This morning, he is sleeping but arousable and able to answer questions. Complaining of sore throat overnight.   VITAL SIGNS: Temp:  [99.7 F (37.6 C)-100.8 F (38.2 C)] 100 F (37.8 C) (04/30 0700) Pulse Rate:  [105-116] 106 (04/30 0700) Resp:  [15-29] 18 (04/30 0700) BP: (104-130)/(62-76) 119/70 (04/30 0700) SpO2:  [97 %-100 %] 100 % (04/30 0700) FiO2 (%):  [40 %] 40 % (04/30 0600) Weight:  [157 kg (346 lb 2 oz)] 157 kg (346 lb 2 oz) (04/30 0400)   HEMODYNAMICS:    VENTILATOR SETTINGS: Vent Mode: PRVC FiO2 (%):  [40 %] 40 % Set Rate:  [18 bmp] 18 bmp Vt Set:  [650 mL] 650 mL PEEP:  [8 cmH20] 8 cmH20 Pressure Support:  [10 cmH20] 10 cmH20 Plateau Pressure:  [21 cmH20-22 cmH20] 21 cmH20  INTAKE / OUTPUT: Intake/Output      04/29 0701 - 04/30 0700 04/30 0701 - 05/01 0700   I.V. (mL/kg) 135 (0.9)    NG/GT 1100    IV Piggyback 100    Total Intake(mL/kg) 1335 (8.5)    Urine (mL/kg/hr) 21 (0)    Other     Stool 0 (0)    Total Output 21     Net +1314          Stool Occurrence 1 x  PHYSICAL EXAMINATION: Physical Exam  Constitutional: He is well-developed, well-nourished, and in no distress. No distress.  obese  HENT:  Head: Normocephalic and atraumatic.  ETT in place  Eyes: Conjunctivae are normal. Right eye exhibits no discharge. Left eye exhibits no discharge. No scleral icterus.  Neck: Neck supple. No JVD present.  L IJ cath, R HD cath in place  Cardiovascular: Normal rate and regular rhythm.   Pulmonary/Chest: Effort normal and breath sounds normal. No respiratory distress.  Abdominal: Soft. He exhibits no distension and no mass. There is no tenderness. There is no rebound and no guarding.  Neurological:  Wiggles toes on command. Nods appropriately to questions.  Skin: He is not diaphoretic.      LABS: PULMONARY  Recent Labs Lab 09/12/16 2002  09/13/16 0935 09/15/16 1416 09/15/16 1648  09/16/16 0400 09/16/16 1129  PHART 7.406  < > 7.363 7.150* 7.263* 7.389 7.398  PCO2ART 37.2  < > 41.5 74.7* 57.0* 36.5 34.2  PO2ART 86.0  < > 72.1* 67.0* 70.0* 184* 127.0*  HCO3 23.4  < > 23.0 26.4 25.8 21.6 20.9  TCO2 25  --   --  29 27  --  22  O2SAT 97.0  < > 93.7 88.0 91.0 99.1 99.0  < > = values in this interval not displayed.  CBC  Recent Labs Lab 09/17/16 0520 09/18/16 0434 09/19/16 0411  HGB 8.9* 9.8* 9.5*  HCT 27.7* 30.0* 29.3*  WBC 15.6* 15.3* 18.3*  PLT 266 289 290    COAGULATION No results for input(s): INR in the last 168 hours.  CARDIAC   No results for input(s): TROPONINI in the last 168 hours. No results for input(s): PROBNP in the last 168 hours.   CHEMISTRY  Recent Labs Lab 09/14/16 0307  09/15/16 0417 09/15/16 1659 09/16/16 0420 09/16/16 1933 09/17/16 0520 09/18/16 0434 09/19/16 0411 09/19/16 0413  NA 134*  < > 133* 136 134* 134* 132* 135  --  137  K 5.0  < > 5.1 5.5* 5.2* 4.4 4.2 4.0  --  3.9  CL 100*  < > 99* 100* 99* 91* 92* 94*  --  91*  CO2 24  < > 23 24 21* 26 23 25   --  23  GLUCOSE 106*  < > 116* 142* 124* 140* 98 135*  --  141*  BUN 41*  < > 32* 30* 37* 22* 34* 36*  --  80*  CREATININE 4.87*  < > 4.42* 4.16* 5.26* 3.76* 5.09* 5.39*  --  8.32*  CALCIUM 6.7*  < > 6.9* 7.4* 7.1* 7.7* 7.0* 7.7*  --  7.7*  MG 2.2  --  2.3  --  2.3  --   --  2.5* 2.7*  --   PHOS 6.6*  < > 5.8* 7.1* 5.0* 5.1*  --  6.6*  --  8.8*  < > = values in this interval not displayed. Estimated Creatinine Clearance: 21.7 mL/min (A) (by C-G formula based on SCr of 8.32 mg/dL (H)).   LIVER  Recent Labs Lab 09/15/16 1659 09/16/16 0420 09/16/16 1933 09/18/16 0434 09/19/16 0413  ALBUMIN 2.6* 2.4* 2.7* 2.6* 2.6*     INFECTIOUS  Recent Labs Lab 09/16/16 0420 09/17/16 0520 09/18/16 0434  PROCALCITON 4.69 5.64 5.30     ENDOCRINE CBG (last 3)   Recent Labs  09/18/16 2340 09/19/16 0422 09/19/16 0721  GLUCAP 149* 132* 139*      cxr - slowly  improvnig   IMAGING x48h  - image(s)  personally visualized  -   highlighted in bold Dg Chest Port 1 View  Result Date: 09/18/2016 CLINICAL DATA:  Acute respiratory failure. Endotracheal tube present. EXAM: PORTABLE CHEST 1 VIEW COMPARISON:  09/17/2016 FINDINGS: Support lines and tubes remain in stable position. Cardiomegaly stable. Bilateral pulmonary airspace disease and low lung volumes show no significant change. There is persistent collapse or consolidation in the left retrocardiac lung base. No pneumothorax visualized. IMPRESSION: No significant interval change compared to prior exam. Electronically Signed   By: Earle Gell M.D.   On: 09/18/2016 07:22     DIAGNOSES: Active Problems:   Acute respiratory failure (HCC)   Pressure injury of skin   Acute renal failure (HCC)   Metabolic acidosis   Acute pulmonary edema (HCC)   Respiratory distress  ASSESSMENT / PLAN:  PULMONARY A: Acute hypoxic respiratory failure with  CAP +  pulmonary edema + VAP + untreated OSA/OHS Mucus plugging of right mainstem bronchus  P:   Continue vent support today though will again try to wean today May need a chest ct scan Cont HD Cont Vanc and Zosyn    CARDIOVASCULAR A:  HTN: BP improved. Moderate-sized pericardial effusion: Suspect uremia-related which should improve with dialysis.   P:  Continue home amlodipine 10 mg, clonidine 0.1 mg patch, and labetalol as needed Follow-up echo to reassess effusion size Repeat echo to f/u on pericardial effusion   RENAL A:   Acute Renal Failure: Now in intermittent HD. ANA+ on AI workup raises concern for drug-induced SLE, mCTD, Sjogren's, systemic sclerosis though workup thus far reassuring. RF+ but non-specific. Metabolic Acidosis: Resolved.  Hyperkalemia: Resolved Hypocalcemia: Resolved. Corrected Ca 8.8. Hyperphosphatemia: P 8.8.  P:   Follow BMET HD per Renal Follow up anti histone AB   GASTROINTESTINAL A: No acute issues.  P:    PPI  Continue tube feeds and bowel regimen   HEMATOLOGIC A:   Anemia of chronic disease: Hb 9 this AM. Sickle cell trait P:  Follow CBC   INFECTIOUS A: Sepsis: PCT uptrending. CRRT may have masked his fever as curve peaked at 4F since 4/23. Leukocytosis stable at 15,000.  P:   Follow repeat cultures Vanc/Zosyn for empiric coverage of VAP Added Tylenol prn for throat pain, fever   ENDOCRINE A:   DM  Metabolic bone disease: Severe Vit D 6.8. PTH 159.    P:   SSI Vit D per Nephrology. Received doxercalciferol on 4/22.    NEUROLOGIC A:   Sedation needs on vent: Requiring hardly any prn. P:   RASS goal: -1 Continue fentanyl prn   Charlott Rakes, PGY3 Internal Medicine Pager: [phone] 09/19/2016 7:56 AM   ATTENDING NOTE / ATTESTATION NOTE :   I have discussed the case with the resident/APP  Dr. Posey Pronto .  I agree with the resident/APP's  history, physical examination, assessment, and plans.    I have edited the above note and modified it according to our agreed history, physical examination, assessment and plan.   26 yo male with DM , HTN , Hyperlipidemia , Renal Insufficiency and Morbid obesity presented to ER via EMS emergently with confusion and respiratory distress . On arrival found to have BS at 63. Given D50 by EMS . Pt w/ hypoxia,  tachypnea , temp 94. and in resp distress placed on BIPAP support. Says has had cold/bronchitic symptoms for last 2 weeks with cough/congestion . Seen by PCP 1 week ago, given Zpack with no improvement and progression of sob with lower extremity  swelling. Labs returned showed severe acidosis with pH at 7.118, PCO2 43, PO 44, HCO3 14. Hbg 6.1, K 6.9 , Scr 15  . CXR showed diffuse interstitial marking ? puml edema . Lactic acid 0.45 . Troponin neg.  D Dimer 17. Pt was started on Vanc and Zosyn . b/p elevated on arrival , w/ no hypotension. In ER he was given lasix 40mg  x 1 , solumedrol x 1  . Given D50 x 2 , started on HCO3 drip  , calcium given and albuterol given . On arrival to ER , pt in severe resp distress on BIPAP . Still alert but very agitated with increased wob, accessory use and tachypnea . Pt will require emergent intubation and Hemodialysis . Mother and Sister at bedside with update given .    Pt self extubated on 4/26 and was reintubated for resp distress 2/2 pulm edema. He was switched to IHD on Friday and Saturday.  Doing daily PST. Remained febrile since off CRRT.      Vitals:  Vitals:   09/19/16 0905 09/19/16 0930 09/19/16 0945 09/19/16 1000  BP:  (!) 115/59 112/63 117/61  Pulse: (!) 106 (!) 111 (!) 110 (!) 108  Resp: (!) 29 (!) 28  (!) 27  Temp:  100.2 F (37.9 C)  100.2 F (37.9 C)  TempSrc:  Core (Comment)    SpO2: 98% 97%  99%  Weight:  (!) 159 kg (350 lb 8.5 oz)    Height:        Constitutional/General: morbidly obese,  intubated, sedated, not in any distress  Body mass index is 45.01 kg/m. Wt Readings from Last 3 Encounters:  09/19/16 (!) 159 kg (350 lb 8.5 oz)  11/28/14 (!) 172.4 kg (380 lb)  06/30/14 (!) 164.7 kg (363 lb)    HEENT: PERLA, anicteric sclerae. (-) Oral thrush. Intubated, ETT in place  Neck: No masses. Midline trachea. No JVD, (-) LAD. (-) bruits appreciated.  Respiratory/Chest: Grossly normal chest. (-) deformity. (-) Accessory muscle use.  Symmetric expansion. Diminished BS on both lower lung zones. (-) wheezing,  Rhonchi Crackles bibasilar  (-) egophony  Cardiovascular: Regular rate and  rhythm, heart sounds normal, no murmur or gallops,  Gr 2  peripheral edema  Gastrointestinal:  Normal bowel sounds. Soft, non-tender. No hepatosplenomegaly.  (-) masses.   Musculoskeletal:  Normal muscle tone.   Extremities: Grossly normal. (-) clubbing, cyanosis.  Gr 2  edema  Skin: (-) rash,lesions seen.   Neurological/Psychiatric : sedated, intubated. CN grossly intact. (-) lateralizing signs.     CBC Recent Labs     09/17/16  0520  09/18/16  0434   09/19/16  0411  WBC  15.6*  15.3*  18.3*  HGB  8.9*  9.8*  9.5*  HCT  27.7*  30.0*  29.3*  PLT  266  289  290    Coag's No results for input(s): APTT, INR in the last 72 hours.  BMET Recent Labs     09/17/16  0520  09/18/16  0434  09/19/16  0413  NA  132*  135  137  K  4.2  4.0  3.9  CL  92*  94*  91*  CO2  23  25  23   BUN  34*  36*  80*  CREATININE  5.09*  5.39*  8.32*  GLUCOSE  98  135*  141*    Electrolytes Recent Labs     09/16/16  1933  09/17/16  0520  09/18/16  0434  09/19/16  0411  09/19/16  0413  CALCIUM  7.7*  7.0*  7.7*   --   7.7*  MG   --    --   2.5*  2.7*   --   PHOS  5.1*   --   6.6*   --   8.8*    Sepsis Markers Recent Labs     09/17/16  0520  09/18/16  0434  PROCALCITON  5.64  5.30    ABG Recent Labs     09/16/16  1129  PHART  7.398  PCO2ART  34.2  PO2ART  127.0*    Liver Enzymes Recent Labs     09/16/16  1933  09/18/16  0434  09/19/16  0413  ALBUMIN  2.7*  2.6*  2.6*    Cardiac Enzymes No results for input(s): TROPONINI, PROBNP in the last 72 hours.  Glucose Recent Labs     09/18/16  1133  09/18/16  1524  09/18/16  1922  09/18/16  2340  09/19/16  0422  09/19/16  0721  GLUCAP  170*  151*  131*  149*  132*  139*    Imaging Dg Chest Port 1 View  Result Date: 09/18/2016 CLINICAL DATA:  Acute respiratory failure. Endotracheal tube present. EXAM: PORTABLE CHEST 1 VIEW COMPARISON:  09/17/2016 FINDINGS: Support lines and tubes remain in stable position. Cardiomegaly stable. Bilateral pulmonary airspace disease and low lung volumes show no significant change. There is persistent collapse or consolidation in the left retrocardiac lung base. No pneumothorax visualized. IMPRESSION: No significant interval change compared to prior exam. Electronically Signed   By: Earle Gell M.D.   On: 09/18/2016 07:22   Dg Chest Port 1v Same Day  Result Date: 09/19/2016 CLINICAL DATA:  Acute respiratory failure, pulmonary edema, diabetes.  EXAM: PORTABLE CHEST 1 VIEW COMPARISON:  Portable chest x-ray of September 18, 2016 FINDINGS: The radiograph is obtained in a lordotic position. There has been interval development of subsegmental atelectasis in the inferior aspect of the right upper lobe. There is persistent increased density in the retrocardiac region on the left. The cardiac silhouette remains enlarged. The pulmonary vascularity is slightly less conspicuous today. There is no pleural effusion. The esophagogastric tube tip projects below the inferior margin of the image. The endotracheal tube tip lies just below the inferior margin of the clavicular heads. IMPRESSION: Interval development of subsegmental atelectasis in the right upper lobe. Persistent left basilar atelectasis or pneumonia. Stable cardiomegaly with only mild pulmonary interstitial edema. Electronically Signed   By: David  Martinique M.D.   On: 09/19/2016 08:31    Assessment/Plan :  Acute hypoxic respiratory failure with  CAP (by history)  +  Acute pulmonary edema + Supect VAP  + untreated OSA/OHS Mucus plugging of right mainstem bronchus - Continue ventilatory support. - Daily weaning trial - take fluid off with IHD - cont Vanc and Zosyn for now - May need a Chest ct scan to better delineate LLL.  LLL looks plugged. Still febrile.  Not sure if he has an abscess/empyema in LLL.    Moderate sized pericardial effusion, suspect related to uremia' - rpt echo as f/u.    AKI/CKD - cont HD. Likely HD dependent now   I spent  30   minutes of Critical Care time with this patient today. This is my time spent independent of the APP or resident.   Family :Family updated at length today.  No family at bedside.    Elsie Saas A de  Dios, MD 09/19/2016, 10:29 AM [facility] Pager [phone] After 3 pm or if no answer, call [phone]

## 2016-09-20 ENCOUNTER — Inpatient Hospital Stay (HOSPITAL_COMMUNITY): Payer: Medicaid Other

## 2016-09-20 DIAGNOSIS — L8990 Pressure ulcer of unspecified site, unspecified stage: Secondary | ICD-10-CM

## 2016-09-20 DIAGNOSIS — J9601 Acute respiratory failure with hypoxia: Secondary | ICD-10-CM

## 2016-09-20 DIAGNOSIS — J811 Chronic pulmonary edema: Secondary | ICD-10-CM

## 2016-09-20 DIAGNOSIS — I313 Pericardial effusion (noninflammatory): Secondary | ICD-10-CM

## 2016-09-20 DIAGNOSIS — J9600 Acute respiratory failure, unspecified whether with hypoxia or hypercapnia: Secondary | ICD-10-CM

## 2016-09-20 LAB — TROPONIN I: Troponin I: 0.12 ng/mL (ref <0.03)

## 2016-09-20 LAB — GLUCOSE, CAPILLARY
GLUCOSE-CAPILLARY: 128 mg/dL — AB (ref 65–99)
GLUCOSE-CAPILLARY: 135 mg/dL — AB (ref 65–99)
GLUCOSE-CAPILLARY: 148 mg/dL — AB (ref 65–99)
GLUCOSE-CAPILLARY: 165 mg/dL — AB (ref 65–99)
Glucose-Capillary: 134 mg/dL — ABNORMAL HIGH (ref 65–99)
Glucose-Capillary: 121 mg/dL — ABNORMAL HIGH (ref 65–99)

## 2016-09-20 LAB — RENAL FUNCTION PANEL
ANION GAP: 20 — AB (ref 5–15)
Albumin: 2.8 g/dL — ABNORMAL LOW (ref 3.5–5.0)
BUN: 62 mg/dL — ABNORMAL HIGH (ref 6–20)
CALCIUM: 8.8 mg/dL — AB (ref 8.9–10.3)
CHLORIDE: 94 mmol/L — AB (ref 101–111)
CO2: 24 mmol/L (ref 22–32)
Creatinine, Ser: 7.29 mg/dL — ABNORMAL HIGH (ref 0.61–1.24)
GFR calc Af Amer: 11 mL/min — ABNORMAL LOW (ref >60)
GFR calc non Af Amer: 9 mL/min — ABNORMAL LOW (ref >60)
Glucose, Bld: 135 mg/dL — ABNORMAL HIGH (ref 65–99)
PHOSPHORUS: 6.5 mg/dL — AB (ref 2.5–4.6)
Potassium: 4.1 mmol/L (ref 3.5–5.1)
SODIUM: 138 mmol/L (ref 135–145)

## 2016-09-20 LAB — ECHOCARDIOGRAM COMPLETE
Height: 74 in
WEIGHTICAEL: 5555.59 [oz_av]

## 2016-09-20 LAB — CBC WITH DIFFERENTIAL/PLATELET
BASOS ABS: 0.1 10*3/uL (ref 0.0–0.1)
BASOS PCT: 1 %
EOS PCT: 3 %
Eosinophils Absolute: 0.6 10*3/uL (ref 0.0–0.7)
HCT: 31.3 % — ABNORMAL LOW (ref 39.0–52.0)
Hemoglobin: 10.2 g/dL — ABNORMAL LOW (ref 13.0–17.0)
LYMPHS PCT: 8 %
Lymphs Abs: 1.5 10*3/uL (ref 0.7–4.0)
MCH: 25.5 pg — ABNORMAL LOW (ref 26.0–34.0)
MCHC: 32.6 g/dL (ref 30.0–36.0)
MCV: 78.3 fL (ref 78.0–100.0)
MONO ABS: 3.2 10*3/uL — AB (ref 0.1–1.0)
MONOS PCT: 18 %
Neutro Abs: 12.9 10*3/uL — ABNORMAL HIGH (ref 1.7–7.7)
Neutrophils Relative %: 71 %
PLATELETS: 284 10*3/uL (ref 150–400)
RBC: 4 MIL/uL — ABNORMAL LOW (ref 4.22–5.81)
RDW: 18 % — AB (ref 11.5–15.5)
WBC: 18.3 10*3/uL — ABNORMAL HIGH (ref 4.0–10.5)

## 2016-09-20 LAB — MAGNESIUM: Magnesium: 3 mg/dL — ABNORMAL HIGH (ref 1.7–2.4)

## 2016-09-20 LAB — HISTONE ANTIBODIES, IGG, BLOOD: DNA-HISTONE: 1.5 U — AB (ref 0.0–0.9)

## 2016-09-20 MED ORDER — IOPAMIDOL (ISOVUE-300) INJECTION 61%
INTRAVENOUS | Status: AC
  Administered 2016-09-20: 30 mL
  Filled 2016-09-20: qty 30

## 2016-09-20 MED ORDER — VANCOMYCIN HCL IN DEXTROSE 1-5 GM/200ML-% IV SOLN
1000.0000 mg | INTRAVENOUS | Status: DC
  Administered 2016-09-21: 1000 mg via INTRAVENOUS
  Filled 2016-09-20 (×2): qty 200

## 2016-09-20 NOTE — Progress Notes (Signed)
Admit: 09/11/2016 LOS: 9  25M with dialysis dependent AoCKD, sepsis, VDRF  Subjective:  iHD yesterday,  1.7L UF limited by low BPs No UOP Cont to have low grade fevers BCx NGTD   04/30 0701 - 05/01 0700 In: 600 [I.V.:180; NG/GT:160; IV Piggyback:110] Out: 2187 [Urine:18; Emesis/NG output:401]  Filed Weights   09/19/16 0930 09/19/16 1346 09/20/16 0500  Weight: (!) 159 kg (350 lb 8.5 oz) (!) 157.5 kg (347 lb 3.6 oz) (!) 156 kg (343 lb 14.7 oz)    Scheduled Meds: . acetylcysteine  4 mL Nebulization BID  . albuterol  2.5 mg Nebulization Q4H  . amLODipine  10 mg Per Tube Daily  . chlorhexidine gluconate (MEDLINE KIT)  15 mL Mouth Rinse BID  . Chlorhexidine Gluconate Cloth  6 each Topical Daily  . darbepoetin (ARANESP) injection - DIALYSIS  100 mcg Intravenous Q Mon-HD  . doxercalciferol  4 mcg Intravenous Once  . feeding supplement (PRO-STAT SUGAR FREE 64)  60 mL Per Tube TID  . feeding supplement (VITAL HIGH PROTEIN)  1,000 mL Per Tube Q24H  . heparin subcutaneous  5,000 Units Subcutaneous Q8H  . insulin aspart  1-3 Units Subcutaneous Q4H  . levothyroxine  75 mcg Intravenous Daily  . mouth rinse  15 mL Mouth Rinse QID  . pantoprazole sodium  40 mg Per Tube QHS  . sodium chloride flush  10-40 mL Intracatheter Q12H   Continuous Infusions: . sodium chloride 250 mL (09/19/16 0843)  . sodium chloride 100 mL (09/18/16 0400)  . sodium chloride    . dextrose    . piperacillin-tazobactam (ZOSYN)  IV 3.375 g (09/20/16 0325)  . propofol (DIPRIVAN) infusion Stopped (09/17/16 0831)   PRN Meds:.sodium chloride, sodium chloride, sodium chloride, acetaminophen (TYLENOL) oral liquid 160 mg/5 mL, albuterol, alteplase, dextrose, docusate, fentaNYL (SUBLIMAZE) injection, fentaNYL (SUBLIMAZE) injection, heparin, heparin, heparin, labetalol, lidocaine (PF), lidocaine-prilocaine, midazolam, midazolam, ondansetron (ZOFRAN) IV, pentafluoroprop-tetrafluoroeth  Current Labs: reviewed   Physical  Exam:  Blood pressure 109/78, pulse (!) 117, temperature (!) 100.6 F (38.1 C), resp. rate 17, height _0  (1.88 m), weight (!) 156 kg (343 lb 14.7 oz), SpO2 99 %. Awake, alert, interactive Intubated RRR 1+ LEE, pitting into shins R IJ temp HD cath  A 1. Anuric Dialysis depdendent AoCKD 2. CAP / VAP, persistent fever, B Cx neg, CCM managing; Vanc/Zosyn 3. Sepsis, off pressors 4. VDRF 5. Anemia, on ESA  P 1. Cont iHD MWF, pull fluid as able: 2K, 2.5 Ca, tight hep, goal 4L 2. Consider removing temp cath post HD, discuss with CCM, not sure ready for tunneled line 3. There is a possibility of renal recovery, largely since time course of renal faiulre is unclear (is this dominantly acute or progressive CKD...).  Kidneys were normal sized at admit Renal US.  Would use contrasted studies only if no viable other options at this time.     Pearson Grippe MD 09/20/2016, 7:46 AM   Recent Labs Lab 09/18/16 0434 09/19/16 0413 09/20/16 0430  NA 135 137 138  K 4.0 3.9 4.1  CL 94* 91* 94*  CO2 _1 GLUCOSE 135* 141* 135*  BUN 36* 80* 62*  CREATININE 5.39* 8.32* 7.29*  CALCIUM 7.7* 7.7* 8.8*  PHOS 6.6* 8.8* 6.5*    Recent Labs Lab 09/18/16 0434 09/19/16 0411 09/20/16 0430  WBC 15.3* 18.3* 18.3*  NEUTROABS 11.1* 13.8* 12.9*  HGB 9.8* 9.5* 10.2*  HCT 30.0* 29.3* 31.3*  MCV 77.5* 77.5* 78.3  PLT 289 290  Paul [name]

## 2016-09-20 NOTE — Progress Notes (Signed)
*  PRELIMINARY RESULTS* Vascular Ultrasound Lower extremity venous duplex has been completed.  Preliminary findings: No evidence of DVT in visualized veins or baker's cyst.   Landry Mellow, RDMS, RVT  09/20/2016, 10:13 AM

## 2016-09-20 NOTE — Progress Notes (Signed)
   09/20/16 2115  Adult Ventilator Settings  Vent Type Servo i  Humidity HME  Vent Mode PRVC  Vt Set 650 mL  Set Rate 18 bmp  FiO2 (%) 40 %  I Time 0.9 Sec(s)  PEEP 8 cmH20  Patient transported to ct scan and back tio unit without any complication. He is placed back on the above vent settings.

## 2016-09-20 NOTE — Progress Notes (Signed)
Pharmacy Antibiotic Note  Paul [name] is a 26 y.o. male admitted on 09/11/2016 with pneumonia.  Pharmacy has been consulted for vancomycin dosing.  Per Renal, patient will be on MWF dialysis schedule.  He tolerated his session yesterday.   Plan: Vancomycin 1g IV post HD on MWF Continue Zosyn 3.375g IV Q12H - 4 hour infusion Monitor clinical status, culture results, and HD tolerance  Height: _0  (188 cm) Weight: (!) 343 lb 14.7 oz (156 kg) IBW/kg (Calculated) : 82.2  Temp (24hrs), Avg:100.6 F (38.1 C), Min:99.4 F (37.4 C), Max:101.7 F (38.7 C)   Recent Labs Lab 09/16/16 0420 09/16/16 1933 09/17/16 0520 09/17/16 1302 09/18/16 0434 09/19/16 0411 09/19/16 0413 09/20/16 0430  WBC 18.3*  --  15.6*  --  15.3* 18.3*  --  18.3*  CREATININE 5.26* 3.76* 5.09*  --  5.39*  --  8.32* 7.29*  VANCORANDOM  --   --   --  45  --   --   --   --     Estimated Creatinine Clearance: 24.7 mL/min (A) (by C-G formula based on SCr of 7.29 mg/dL (H)).    No Known Allergies  Antimicrobials this admission: LVQ 4/22 >>4/25 CTX 4/22 >>4/23 Vancomycin 4/22 x1; restart 4/27 >> Zosyn 4/22 x1; restart 4/27 >>  Dose adjustments this admission: 4/28 Pre-HD vancomycin level: 45 (supratherapeutic; target preHD level 15-25) - no vanc given post HD ---Post HD level estimated to be (3.5hrs @ BFR 400) = ~29 4/30: Vancomycin 1g post HD today due to estimated post HD level ~16  Microbiology results: 4/22 RVP: NEG 4/22 Leg UAg: NEG 4/22 Strep pneum UAg -NEG 4/22 BCx x3: ngtd 4/22 UCx: NEG 4/23 TA: normal flora 4/22 MRSA - NEG 4/26 BAL Cx: normal resp flora 4/27 BCx: ngtd 4/27 HepB: Negative   Cierrah Dace D. Mina Marble, PharmD, BCPS Pager:  [phone] 09/20/2016, 8:57 AM

## 2016-09-20 NOTE — Procedures (Signed)
Pt placed back on full support at this time due to increased WOB, pt tolerating well. RT will monitor

## 2016-09-20 NOTE — Progress Notes (Signed)
PULMONARY  / CRITICAL CARE MEDICINE  Name: Paul [name] MRN: [id number] DOB: 01/09/1991    LOS: 44  REFERRING MD :  ED  CHIEF COMPLAINT:  "Cant breathe "   brief  26 yo male with DM , HTN , Hyperlipidemia , Renal Insufficiency and Morbid obesity presented to ER via EMS emergently with confusion and respiratory distress . On arrival found to have BS at 34. Given D50 by EMS . Pt w/ hypoxia,  tachypnea , temp 94. and in resp distress placed on BIPAP support. Says has had cold/bronchitic symptoms for last 2 weeks with cough/congestion . Seen by PCP 1 week ago, given Zpack with no improvement and progression of sob with lower extremity swelling. Labs returned showed severe acidosis with pH at 7.118, PCO2 43, PO 44, HCO3 14. Hbg 6.1, K 6.9 , Scr 15  . CXR showed diffuse interstitial marking ? puml edema . Lactic acid 0.45 . Troponin neg.  D Dimer 17. Pt was started on Vanc and Zosyn . b/p elevated on arrival , w/ no hypotension. In ER he was given lasix 12m x 1 , solumedrol x 1  . Given D50 x 2 , started on HCO3 drip , calcium given and albuterol given . On arrival to ER , pt in severe resp distress on BIPAP . Still alert but very agitated with increased wob, accessory use and tachypnea . Pt will require emergent intubation and Hemodialysis . Mother and Sister at bedside with update given .     LINES / TUBES: 4/22 ETT >> 4/22 HD cath >> 4/22 L IJ >>  CULTURES: 4/22 BC x 2 >> (-) 4/22 Sputum CX >> (-) 4/22 Viral panel negative 4/26 BAL/ Sputum Cx: (-) 4/27 BC x 2 >> (-)  ANTIBIOTICS: 4/22 Vanc/Zosyn x 1 4/22 Rocephin >> 4/23 4/22 Levaquin >> 4/25 4/27 Vanc/Zosyn >>  SIGNIFICANT EVENTS:  4/23: Intubated and dialyzed. 4/26: Self-extubated but reintubated complicated by mucus plugging of right mainstem bronchus. CRRT stopped.  4/27: Switched to intermittent HD.  STUDIES:  Echo 4/22 EF 60-65%, moderate-sized pericardial effusion Renal UKorea4/23: Medical renal disease. CXR 4/23: Improved  edema Echo 4/30 EF 60-65%, Loculated moderate sized pericardial effusion   SUBJECTIVE (-) issues overnight. Complained of abd pain on 4/30 Fever pattern the same since after CRRT (4/26)   VITAL SIGNS: Temp:  [99.4 F (37.4 C)-101.7 F (38.7 C)] 100.6 F (38.1 C) (05/01 0600) Pulse Rate:  [106-126] 122 (05/01 0748) Resp:  [13-29] 18 (05/01 0748) BP: (96-132)/(52-86) 118/86 (05/01 0748) SpO2:  [93 %-100 %] 99 % (05/01 0752) FiO2 (%):  [40 %] 40 % (05/01 0752) Weight:  [156 kg (343 lb 14.7 oz)-159 kg (350 lb 8.5 oz)] 156 kg (343 lb 14.7 oz) (05/01 0500)   HEMODYNAMICS:    VENTILATOR SETTINGS: Vent Mode: PSV;CPAP FiO2 (%):  [40 %] 40 % Set Rate:  [18 bmp] 18 bmp Vt Set:  [650 mL] 650 mL PEEP:  [8 cmH20] 8 cmH20 Pressure Support:  [10 cmH20] 10 cmH20 Plateau Pressure:  [26 cmH20-27 cmH20] 26 cmH20  INTAKE / OUTPUT: Intake/Output      04/30 0701 - 05/01 0700 05/01 0701 - 05/02 0700   I.V. (mL/kg) 180 (1.2)    Other 150    NG/GT 160    IV Piggyback 110    Total Intake(mL/kg) 600 (3.8)    Urine (mL/kg/hr) 18 (0)    Emesis/NG output 401 (0.1)    Other 1768 (0.5)    Stool 0 (  0)    Total Output 2187     Net -1587          Stool Occurrence 1 x    Emesis Occurrence 1 x     PHYSICAL EXAMINATION: Physical Exam  Constitutional: He is well-developed, well-nourished, and in no distress. No distress.  obese  HENT:  Head: Normocephalic and atraumatic.  ETT in place  Eyes: Conjunctivae are normal. Right eye exhibits no discharge. Left eye exhibits no discharge. No scleral icterus.  Neck: Neck supple. No JVD present.  L IJ cath, R HD cath in place  Cardiovascular: Normal rate and regular rhythm.   Pulmonary/Chest: Effort normal. No respiratory distress. He has rales.  Abdominal: Soft. Bowel sounds are normal. He exhibits no distension and no mass. There is no rebound.  Neurological: He is alert.  Wiggles toes on command. Nods appropriately to questions.  Skin: He is not  diaphoretic.     ASSESSMENT / PLAN:  PULMONARY A: Acute hypoxic hypercapneic respiratory failure with  CAP (by history)/possible VAP +  pulmonary edema + untreated OSA/OHS Mucus plugging of right mainstem bronchus on 4/26 P:   Continue vent support. Cont weaning.  Cont HD Cont Vanc and Zosyn Pt remains febrile. Will discuss in ID/Cards section. Keep intubated pending work up.     CARDIOVASCULAR A:  Pulmonary edema, improved HTN Moderate-sized pericardial effusion, now loculated.  P:  Continue home amlodipine 10 mg  and labetalol as needed Pt remains febrile and cultures have been (-).  2DEcho now shows a loculated posterior pericardial effusion.  Previously free flowing.  Not sure if this is the source of infection.  Will consult [facility].  Will likely need chest ct scan but will await if [facility]CVS wants contrast with scan.  Avoiding contrast as per Renal, there might be chance of renal recovery. Will need to talk to mother. Keep intubated for now.   >> Addendum :  Briefly d/w TCVS re: pericardial effusion.  Will await official consult.  [facility] is not concerned about effusion as source of infection.  It likely is not loculated. Will pan scan pt as part of work up for fever. No contrast.   RENAL A:   Acute Renal Failure CKD P:   Cont  intermittent HD MWF. There is chance of renal recovery per renal.  Avoiding ct scan  contrast if possible ANA+ on AI workup raises concern for drug-induced SLE. Histone ab is still pending.  mCTD, Sjogren's, systemic sclerosis, RA markers (-)    GASTROINTESTINAL A: Nausea and vomiting P:   PPI  Continue tube feeds and bowel regimen   HEMATOLOGIC A:   Anemia of chronic disease Sickle cell trait P:  Follow CBC   INFECTIOUS A:  Concern for infected pericardial effusion. Now loculated.  P:   Vanc/Zosyn for empiric coverage of VAP/pericardial effusion Added Tylenol prn for throat pain, fever Will need scan of chest + abd + pelvis with no  contrast as renal thinks there is a small chance of recovery.     ENDOCRINE A:   DM  Metabolic bone disease: Severe Vit D 6.8. PTH 159.   P:   SSI Vit D per Nephrology. Received doxercalciferol on 4/22.    NEUROLOGIC A:   Sedation needs on vent: Requiring hardly any prn. P:   RASS goal: -1 Continue fentanyl and versed prn    I spent  30   minutes of Critical Care time with this patient today.   Family :  No family  at bedside.    Monica Becton, MD 09/20/2016, 8:56 AM [facility] Pager [phone] After 3 pm or if no answer, call [phone]

## 2016-09-20 NOTE — Progress Notes (Signed)
  Subjective: Patient examined, last 2 echocardiogram images personally reviewed The patient is currently intubated and sedated His pericardial effusion is moderate and is consistent with uremic pericarditis Now that he has started dialysis  It should improve He has no clinical signs of tamponade with bounding peripheral pulses and warm extremities, no JVD There has been a slight interval increase in his pericardial effusion on the last study recommend repeating an echocardiogram 5/3   Objective: Vital signs in last 24 hours: Temp:  [99.4 F (37.4 C)-101.7 F (38.7 C)] 100.2 F (37.9 C) (05/01 1800) Pulse Rate:  [108-126] 109 (05/01 1800) Cardiac Rhythm: Sinus tachycardia (05/01 1600) Resp:  [13-26] 18 (05/01 1800) BP: (63-136)/(44-87) 136/75 (05/01 1800) SpO2:  [96 %-100 %] 100 % (05/01 1937) FiO2 (%):  [40 %] 40 % (05/01 1937) Weight:  [343 lb 14.7 oz (156 kg)] 343 lb 14.7 oz (156 kg) (05/01 0500)  Hemodynamic parameters for last 24 hours:   Sinus tachycardia  Intake/Output from previous day: 04/30 0701 - 05/01 0700 In: 610 [I.V.:190; NG/GT:160; IV Piggyback:110] Out: 2187 [Urine:18; Emesis/NG output:401] Intake/Output this shift: No intake/output data recorded.  Sedated on ventilator Breath sounds equal, mildly coarse No murmur or friction rub Mild peripheral edema Warm lower extremities with 3+ pedal pulses  Lab Results:  Recent Labs  09/19/16 0411 09/20/16 0430  WBC 18.3* 18.3*  HGB 9.5* 10.2*  HCT 29.3* 31.3*  PLT 290 284   BMET:  Recent Labs  09/19/16 0413 09/20/16 0430  NA 137 138  K 3.9 4.1  CL 91* 94*  CO2 23 24  GLUCOSE 141* 135*  BUN 80* 62*  CREATININE 8.32* 7.29*  CALCIUM 7.7* 8.8*    PT/INR: No results for input(s): LABPROT, INR in the last 72 hours. ABG    Component Value Date/Time   PHART 7.398 09/16/2016 1129   HCO3 20.9 09/16/2016 1129   TCO2 22 09/16/2016 1129   ACIDBASEDEF 3.0 (H) 09/16/2016 1129   O2SAT 99.0 09/16/2016  1129   CBG (last 3)   Recent Labs  09/20/16 0724 09/20/16 1126 09/20/16 1621  GLUCAP 134* 165* 148*    Assessment/Plan: S/P  Moderate pericardial effusion-no indication for pericardial window Should improve with dialysis Repeat echo later this week   LOS: 9 days    Tharon Aquas Trigt III 09/20/2016

## 2016-09-21 LAB — RENAL FUNCTION PANEL
ANION GAP: 25 — AB (ref 5–15)
Albumin: 2.8 g/dL — ABNORMAL LOW (ref 3.5–5.0)
BUN: 99 mg/dL — ABNORMAL HIGH (ref 6–20)
CHLORIDE: 91 mmol/L — AB (ref 101–111)
CO2: 21 mmol/L — AB (ref 22–32)
CREATININE: 10.45 mg/dL — AB (ref 0.61–1.24)
Calcium: 9.4 mg/dL (ref 8.9–10.3)
GFR, EST AFRICAN AMERICAN: 7 mL/min — AB (ref >60)
GFR, EST NON AFRICAN AMERICAN: 6 mL/min — AB (ref >60)
Glucose, Bld: 124 mg/dL — ABNORMAL HIGH (ref 65–99)
Phosphorus: 7.4 mg/dL — ABNORMAL HIGH (ref 2.5–4.6)
Potassium: 4 mmol/L (ref 3.5–5.1)
Sodium: 137 mmol/L (ref 135–145)

## 2016-09-21 LAB — CBC WITH DIFFERENTIAL/PLATELET
BASOS ABS: 0.1 10*3/uL (ref 0.0–0.1)
BASOS PCT: 1 %
Eosinophils Absolute: 0.8 10*3/uL — ABNORMAL HIGH (ref 0.0–0.7)
Eosinophils Relative: 4 %
HEMATOCRIT: 30.9 % — AB (ref 39.0–52.0)
HEMOGLOBIN: 10.1 g/dL — AB (ref 13.0–17.0)
LYMPHS PCT: 16 %
Lymphs Abs: 3.1 10*3/uL (ref 0.7–4.0)
MCH: 25.4 pg — AB (ref 26.0–34.0)
MCHC: 32.7 g/dL (ref 30.0–36.0)
MCV: 77.6 fL — AB (ref 78.0–100.0)
MONOS PCT: 9 %
Monocytes Absolute: 1.7 10*3/uL — ABNORMAL HIGH (ref 0.1–1.0)
Neutro Abs: 13.3 10*3/uL — ABNORMAL HIGH (ref 1.7–7.7)
Neutrophils Relative %: 70 %
Platelets: 308 10*3/uL (ref 150–400)
RBC: 3.98 MIL/uL — ABNORMAL LOW (ref 4.22–5.81)
RDW: 17.9 % — ABNORMAL HIGH (ref 11.5–15.5)
WBC: 19 10*3/uL — AB (ref 4.0–10.5)

## 2016-09-21 LAB — CULTURE, BLOOD (ROUTINE X 2)
Culture: NO GROWTH
Culture: NO GROWTH
Special Requests: ADEQUATE
Special Requests: ADEQUATE

## 2016-09-21 LAB — GLUCOSE, CAPILLARY
GLUCOSE-CAPILLARY: 119 mg/dL — AB (ref 65–99)
Glucose-Capillary: 140 mg/dL — ABNORMAL HIGH (ref 65–99)
Glucose-Capillary: 125 mg/dL — ABNORMAL HIGH (ref 65–99)
Glucose-Capillary: 156 mg/dL — ABNORMAL HIGH (ref 65–99)
Glucose-Capillary: 115 mg/dL — ABNORMAL HIGH (ref 65–99)
Glucose-Capillary: 116 mg/dL — ABNORMAL HIGH (ref 65–99)

## 2016-09-21 LAB — MAGNESIUM: MAGNESIUM: 3 mg/dL — AB (ref 1.7–2.4)

## 2016-09-21 MED ORDER — SODIUM CHLORIDE 0.9 % IV SOLN
100.0000 mL | INTRAVENOUS | Status: DC | PRN
  Administered 2016-09-26: 14:00:00 via INTRAVENOUS

## 2016-09-21 MED ORDER — LIDOCAINE HCL (PF) 1 % IJ SOLN
5.0000 mL | INTRAMUSCULAR | Status: DC | PRN
  Filled 2016-09-21: qty 5

## 2016-09-21 MED ORDER — HEPARIN SODIUM (PORCINE) 1000 UNIT/ML DIALYSIS
5000.0000 [IU] | INTRAMUSCULAR | Status: DC | PRN
  Filled 2016-09-21: qty 5

## 2016-09-21 MED ORDER — HEPARIN SODIUM (PORCINE) 1000 UNIT/ML DIALYSIS
20.0000 [IU]/kg | INTRAMUSCULAR | Status: DC | PRN
  Filled 2016-09-21: qty 4

## 2016-09-21 MED ORDER — HEPARIN SODIUM (PORCINE) 1000 UNIT/ML DIALYSIS
1000.0000 [IU] | INTRAMUSCULAR | Status: DC | PRN
  Filled 2016-09-21: qty 1

## 2016-09-21 MED ORDER — LIDOCAINE-PRILOCAINE 2.5-2.5 % EX CREA: 1.0000 "application " | TOPICAL_CREAM | CUTANEOUS | Status: DC | PRN

## 2016-09-21 MED ORDER — SODIUM CHLORIDE 0.9 % IV SOLN: 100.0000 mL | INTRAVENOUS | Status: DC | PRN

## 2016-09-21 MED ORDER — ALTEPLASE 2 MG IJ SOLR
2.0000 mg | Freq: Once | INTRAMUSCULAR | Status: DC | PRN
  Filled 2016-09-21: qty 2

## 2016-09-21 MED ORDER — PENTAFLUOROPROP-TETRAFLUOROETH EX AERO: 1.0000 "application " | INHALATION_SPRAY | CUTANEOUS | Status: DC | PRN

## 2016-09-21 NOTE — Progress Notes (Signed)
   [facility] PCCM  > pt has done well with PST.  Good volumes. (+) cuff leak.  >  Plan to extubate to bipap 12-14/5 for 1-2 hrs then wean to [place] or VM.  Definitely needs bipap at HS for most likely OSA.  > mother updated at bedside.   Monica Becton, MD 09/21/2016, 3:02 PM [facility] Pager [phone] After 3 pm or if no answer, call [phone]

## 2016-09-21 NOTE — Progress Notes (Signed)
PULMONARY  / CRITICAL CARE MEDICINE  Name: Paul [name] MRN: [id number] DOB: Nov 19, 1990    LOS: 19  REFERRING MD :  ED  CHIEF COMPLAINT:  "Cant breathe "   brief  26 yo male with DM , HTN , Hyperlipidemia , Renal Insufficiency and Morbid obesity presented to ER via EMS emergently with confusion and respiratory distress . On arrival found to have BS at 3. Given D50 by EMS . Pt w/ hypoxia,  tachypnea , temp 94. and in resp distress placed on BIPAP support. Says has had cold/bronchitic symptoms for last 2 weeks with cough/congestion . Seen by PCP 1 week ago, given Zpack with no improvement and progression of sob with lower extremity swelling. Labs returned showed severe acidosis with pH at 7.118, PCO2 43, PO 44, HCO3 14. Hbg 6.1, K 6.9 , Scr 15  . CXR showed diffuse interstitial marking ? puml edema . Lactic acid 0.45 . Troponin neg.  D Dimer 17. Pt was started on Vanc and Zosyn . b/p elevated on arrival , w/ no hypotension. In ER he was given lasix 30m x 1 , solumedrol x 1  . Given D50 x 2 , started on HCO3 drip , calcium given and albuterol given . On arrival to ER , pt in severe resp distress on BIPAP . Still alert but very agitated with increased wob, accessory use and tachypnea . Pt will require emergent intubation and Hemodialysis . Mother and Sister at bedside with update given .     LINES / TUBES: 4/22 ETT >> 4/22 HD cath >> 4/22 L IJ >>  CULTURES: 4/22 BC x 2 >> (-) 4/22 Sputum CX >> (-) 4/22 Viral panel negative 4/26 BAL/ Sputum Cx: (-) 4/27 BC x 2 >> (-)  ANTIBIOTICS: 4/22 Vanc/Zosyn x 1 4/22 Rocephin >> 4/23 4/22 Levaquin >> 4/25 4/27 Vanc/Zosyn >>  SIGNIFICANT EVENTS:  4/23: Intubated and dialyzed. 4/26: Self-extubated but reintubated complicated by mucus plugging of right mainstem bronchus. CRRT stopped.  4/27: Switched to intermittent HD.  STUDIES:  Echo 4/22 EF 60-65%, moderate-sized pericardial effusion Renal UKorea4/23: Medical renal disease. CXR 4/23: Improved  edema Echo 4/30 EF 60-65%, Loculated moderate sized pericardial effusion   SUBJECTIVE (-) issues overnight. Complained of abd pain on 4/30 but better. Some diarrhea last night.  Fever pattern the same since after CRRT (4/26)   VITAL SIGNS: Temp:  [99.9 F (37.7 C)-101.1 F (38.4 C)] 100.6 F (38.1 C) (05/02 0800) Pulse Rate:  [108-123] 117 (05/02 0800) Resp:  [18-28] 20 (05/02 0800) BP: (104-140)/(44-98) 130/65 (05/02 0800) SpO2:  [97 %-100 %] 100 % (05/02 0800) FiO2 (%):  [40 %] 40 % (05/02 0800) Weight:  [151 kg (332 lb 14.3 oz)] 151 kg (332 lb 14.3 oz) (05/02 0500)   HEMODYNAMICS:    VENTILATOR SETTINGS: Vent Mode: PRVC FiO2 (%):  [40 %] 40 % Set Rate:  [18 bmp] 18 bmp Vt Set:  [650 mL] 650 mL PEEP:  [8 cmH20] 8 cmH20 Pressure Support:  [8 cmH20] 8 cmH20 Plateau Pressure:  [20 cmH20-23 cmH20] 23 cmH20  INTAKE / OUTPUT: Intake/Output      05/01 0701 - 05/02 0700 05/02 0701 - 05/03 0700   I.V. (mL/kg) 130 (0.9) 10 (0.1)   Other     NG/GT 1090    IV Piggyback 50 50   Total Intake(mL/kg) 1270 (8.4) 60 (0.4)   Urine (mL/kg/hr) 4 (0) 8 (0)   Emesis/NG output     Other  Stool 0 (0)    Total Output 4 8   Net +1266 +52        Stool Occurrence 1 x     PHYSICAL EXAMINATION: Physical Exam  Constitutional: He is well-developed, well-nourished, and in no distress. No distress.  obese  HENT:  Head: Normocephalic and atraumatic.  ETT in place  Eyes: Conjunctivae are normal. Right eye exhibits no discharge. Left eye exhibits no discharge. No scleral icterus.  Neck: Neck supple. No JVD present.  L IJ cath, R HD cath in place  Cardiovascular: Normal rate and regular rhythm.   Pulmonary/Chest: Effort normal. No respiratory distress. He has rales.  Bibasilar crackles. Looks comfortable on PST  Abdominal: Soft. Bowel sounds are normal. He exhibits no distension and no mass. There is no rebound.  Musculoskeletal: He exhibits edema.  Neurological: He is alert.   Wiggles toes on command. Nods appropriately to questions.  Skin: He is not diaphoretic.     ASSESSMENT / PLAN:  PULMONARY A: Acute hypoxic hypercapneic respiratory failure with  CAP (by history)/VAP +  pulmonary edema + untreated OSA/OHS Mucus plugging of right mainstem bronchus on 4/26. Resolved.  P:   Continue vent support. Cont weaning. Doing well on PST. Anticipate we can extubate after HD and if with cuff leak.  Cont HD Cont Vanc and Zosyn >> plan to d/c in am.     CARDIOVASCULAR A:  Pulmonary edema, improved HTN Moderate-sized pericardial effusion. Likely related to AKI/CKD.  Sinus tachycardia, likely related to hypovolemia.  P:  Continue home amlodipine 10 mg  and labetalol as needed Appreciate TCVS recommendations.  Pericardial effusion does not seem to be loculated.  It is just posteriorly located.  He is not in tamponade.  Pericardial effusion ikely related to uremia.  Should be better with HD. Plan for f/u echo on 5/3 per TCVS.    Try to keep even during todays HD.  Pt has been tachycardic x 2 days, likely related to hypovolemia now.    RENAL A:   Acute Renal Failure CKD P:   Cont  intermittent HD MWF. There is chance of renal recovery per renal.  Avoiding ct scan contrast if possible ANA+ on AI workup raises concern for drug-induced SLE. Histone ab is still pending.  mCTD, Sjogren's, systemic sclerosis, RA markers (-) Plan to keep him even today as he is now tachycardic.    GASTROINTESTINAL A: Nausea and vomiting Diarrhea x 1 episode P:   PPI  Continue tube feeds and bowel regimen Observe diarrhea   HEMATOLOGIC A:   Anemia of chronic disease Sickle cell trait P:  Follow CBC   INFECTIOUS A:  Cultures have been (-).  Panscan was U/R except for bibasilar PNA and colonic thickening.  P:   Vanc/Zosyn for empiric coverage of VAP. D6 today. Plan to extubate today and see what fever pattern is post extubation. Plan to complete 7d of abx.   Added Tylenol prn for throat pain, fever Will keep temp HD catheter in for now.    ENDOCRINE A:   DM  Metabolic bone disease: Severe Vit D 6.8. PTH 159.   P:   SSI Vit D per Nephrology. Received doxercalciferol on 4/22.    NEUROLOGIC A:   Sedation needs on vent: Requiring hardly any prn. P:   RASS goal: -1 Continue fentanyl and versed prn    I spent  30   minutes of Critical Care time with this patient today.   Family :  Family updated  at bedside on 5/1.  Sister updated on 5/2.    Monica Becton, MD 09/21/2016, 8:36 AM [facility] Pager [phone] After 3 pm or if no answer, call [phone]

## 2016-09-21 NOTE — Progress Notes (Signed)
While setting up system for dialysis the primary nurse asked how much fluid was ordered to be pulled today, the order was for 4L. She brought to my attention that the critical care team was concerned due to the tachycardia that he was possibly dry already. I informed Dr. Joelyn Oms on what was told to me and he ordered for me to keep him even instead of pulling 4L.

## 2016-09-21 NOTE — Progress Notes (Signed)
Admit: 09/11/2016 LOS: 10  16M with dialysis dependent AoCKD, sepsis, VDRF  Subjective:  CT SUrgery consult for pericardial effusion, note reviewed CT CAP w/o contrast yesterday, reviewed For HD today Remains anuric Continued fevers, leukocytosis On Vanc/Zosyn   05/01 0701 - 05/02 0700 In: 1270 [I.V.:130; NG/GT:1090; IV Piggyback:50] Out: 4 [Urine:4]  Filed Weights   09/19/16 1346 09/20/16 0500 09/21/16 0500  Weight: (!) 157.5 kg (347 lb 3.6 oz) (!) 156 kg (343 lb 14.7 oz) (!) 151 kg (332 lb 14.3 oz)    Scheduled Meds: . acetylcysteine  4 mL Nebulization BID  . albuterol  2.5 mg Nebulization Q4H  . amLODipine  10 mg Per Tube Daily  . chlorhexidine gluconate (MEDLINE KIT)  15 mL Mouth Rinse BID  . Chlorhexidine Gluconate Cloth  6 each Topical Daily  . darbepoetin (ARANESP) injection - DIALYSIS  100 mcg Intravenous Q Mon-HD  . doxercalciferol  4 mcg Intravenous Once  . feeding supplement (PRO-STAT SUGAR FREE 64)  60 mL Per Tube TID  . feeding supplement (VITAL HIGH PROTEIN)  1,000 mL Per Tube Q24H  . heparin subcutaneous  5,000 Units Subcutaneous Q8H  . insulin aspart  1-3 Units Subcutaneous Q4H  . levothyroxine  75 mcg Intravenous Daily  . mouth rinse  15 mL Mouth Rinse QID  . pantoprazole sodium  40 mg Per Tube QHS  . sodium chloride flush  10-40 mL Intracatheter Q12H   Continuous Infusions: . sodium chloride 250 mL (09/19/16 0843)  . sodium chloride 100 mL (09/18/16 0400)  . sodium chloride    . dextrose    . piperacillin-tazobactam (ZOSYN)  IV 3.375 g (09/21/16 0400)  . vancomycin     PRN Meds:.sodium chloride, sodium chloride, sodium chloride, acetaminophen (TYLENOL) oral liquid 160 mg/5 mL, albuterol, alteplase, dextrose, docusate, fentaNYL (SUBLIMAZE) injection, fentaNYL (SUBLIMAZE) injection, heparin, heparin, heparin, labetalol, lidocaine (PF), lidocaine-prilocaine, midazolam, midazolam, ondansetron (ZOFRAN) IV, pentafluoroprop-tetrafluoroeth  Current Labs:  reviewed   Physical Exam:  Blood pressure (!) 127/53, pulse (!) 114, temperature (!) 100.6 F (38.1 C), resp. rate (!) 28, height 6' 2"  (1.88 m), weight (!) 151 kg (332 lb 14.3 oz), SpO2 99 %. Awake, alert, interactive Intubated RRR 1+ LEE, pitting into shins R IJ temp HD cath  A 1. Anuric Dialysis depdendent AoCKD -- remains unclear if this how much acute vs chronic component 2. CAP / VAP, persistent fever, B Cx neg, CCM managing; Vanc/Zosyn 3. Sepsis, off pressors 4. VDRF 5. Anemia, on ESA 6. Pericardial effusion  P 1. Cont iHD MWF today, pull fluid as able: 2K, 2.5 Ca, tight hep, goal 4L 2. Consider removing temp cath post HD, discuss with CCM, not sure ready for tunneled line 3. There is a possibility of renal recovery, largely since time course of renal faiulre is unclear (is this dominantly acute or progressive CKD...).  Kidneys were normal sized at admit Renal US.  Would use contrasted studies only if no viable other options at this time.     Pearson Grippe MD 09/21/2016, 7:59 AM   Recent Labs Lab 09/19/16 0413 09/20/16 0430 09/21/16 0500  NA 137 138 137  K 3.9 4.1 4.0  CL 91* 94* 91*  CO2 23 24 21*  GLUCOSE 141* 135* 124*  BUN 80* 62* 99*  CREATININE 8.32* 7.29* 10.45*  CALCIUM 7.7* 8.8* 9.4  PHOS 8.8* 6.5* 7.4*    Recent Labs Lab 09/19/16 0411 09/20/16 0430 09/21/16 0500  WBC 18.3* 18.3* 19.0*  NEUTROABS 13.8* 12.9* 13.3*  HGB  9.5* 10.2* 10.1*  HCT 29.3* 31.3* 30.9*  MCV 77.5* 78.3 77.6*  PLT 290 284 308

## 2016-09-22 ENCOUNTER — Inpatient Hospital Stay (HOSPITAL_COMMUNITY): Payer: Medicaid Other

## 2016-09-22 ENCOUNTER — Other Ambulatory Visit (HOSPITAL_COMMUNITY): Payer: Self-pay

## 2016-09-22 DIAGNOSIS — N179 Acute kidney failure, unspecified: Secondary | ICD-10-CM

## 2016-09-22 DIAGNOSIS — R079 Chest pain, unspecified: Secondary | ICD-10-CM

## 2016-09-22 DIAGNOSIS — J95851 Ventilator associated pneumonia: Secondary | ICD-10-CM

## 2016-09-22 LAB — RENAL FUNCTION PANEL
ALBUMIN: 2.9 g/dL — AB (ref 3.5–5.0)
ANION GAP: 21 — AB (ref 5–15)
BUN: 64 mg/dL — ABNORMAL HIGH (ref 6–20)
CHLORIDE: 90 mmol/L — AB (ref 101–111)
CO2: 24 mmol/L (ref 22–32)
Calcium: 9.8 mg/dL (ref 8.9–10.3)
Creatinine, Ser: 8 mg/dL — ABNORMAL HIGH (ref 0.61–1.24)
GFR calc Af Amer: 10 mL/min — ABNORMAL LOW (ref >60)
GFR, EST NON AFRICAN AMERICAN: 8 mL/min — AB (ref >60)
Glucose, Bld: 118 mg/dL — ABNORMAL HIGH (ref 65–99)
PHOSPHORUS: 7.7 mg/dL — AB (ref 2.5–4.6)
POTASSIUM: 3.6 mmol/L (ref 3.5–5.1)
Sodium: 135 mmol/L (ref 135–145)

## 2016-09-22 LAB — CBC WITH DIFFERENTIAL/PLATELET
BASOS ABS: 0.1 10*3/uL (ref 0.0–0.1)
BASOS PCT: 1 %
Eosinophils Absolute: 0.7 10*3/uL (ref 0.0–0.7)
Eosinophils Relative: 4 %
HEMATOCRIT: 32 % — AB (ref 39.0–52.0)
HEMOGLOBIN: 10.3 g/dL — AB (ref 13.0–17.0)
LYMPHS PCT: 10 %
Lymphs Abs: 1.7 10*3/uL (ref 0.7–4.0)
MCH: 25.2 pg — ABNORMAL LOW (ref 26.0–34.0)
MCHC: 32.2 g/dL (ref 30.0–36.0)
MCV: 78.4 fL (ref 78.0–100.0)
MONOS PCT: 22 %
Monocytes Absolute: 3.8 10*3/uL — ABNORMAL HIGH (ref 0.1–1.0)
NEUTROS ABS: 11.4 10*3/uL — AB (ref 1.7–7.7)
NEUTROS PCT: 64 %
Platelets: 267 10*3/uL (ref 150–400)
RBC: 4.08 MIL/uL — ABNORMAL LOW (ref 4.22–5.81)
RDW: 17.8 % — ABNORMAL HIGH (ref 11.5–15.5)
WBC: 17.7 10*3/uL — ABNORMAL HIGH (ref 4.0–10.5)

## 2016-09-22 LAB — GLUCOSE, CAPILLARY
GLUCOSE-CAPILLARY: 114 mg/dL — AB (ref 65–99)
GLUCOSE-CAPILLARY: 122 mg/dL — AB (ref 65–99)
Glucose-Capillary: 108 mg/dL — ABNORMAL HIGH (ref 65–99)
Glucose-Capillary: 139 mg/dL — ABNORMAL HIGH (ref 65–99)
Glucose-Capillary: 103 mg/dL — ABNORMAL HIGH (ref 65–99)

## 2016-09-22 LAB — MAGNESIUM: Magnesium: 2.6 mg/dL — ABNORMAL HIGH (ref 1.7–2.4)

## 2016-09-22 LAB — ECHOCARDIOGRAM LIMITED
Height: 74 in
Weight: 5326.31 oz

## 2016-09-22 MED ORDER — ACETAMINOPHEN 650 MG RE SUPP
650.0000 mg | Freq: Four times a day (QID) | RECTAL | Status: DC | PRN
  Administered 2016-09-23: 650 mg via RECTAL
  Filled 2016-09-22 (×2): qty 1

## 2016-09-22 NOTE — Progress Notes (Signed)
PT Cancellation Note  Patient Details Name: Paul [name] MRN: [id number] DOB: [birth year]   Cancelled Treatment:    Reason Eval/Treat Not Completed: Patient at procedure or test/unavailable (pt currently in echo will attempt later as time allows)   Jeroline Wolbert B Jujhar Everett 09/22/2016, 12:10 PM  Elwyn Reach, [place]

## 2016-09-22 NOTE — Evaluation (Signed)
Physical Therapy Evaluation Patient Details Name: Paul [name] MRN: [id number] DOB: Sep 30, 1990 Today's Date: 09/22/2016   History of Present Illness  26 yo admitted with sepsis, respiratory failure and renal failure s/p VDRF 4/22-4/26, 4/26-5/2, and CRRT. PMHx: obesity, DM, HTN, renal insufficiency  Clinical Impression  Very pleasant young man who is eager to return home and to caring for himself. 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 Leston demonstrates generalized weakness and deconditioning, impaired balance and cardiopulmonary tolerance as well as decreased transfers and gait who will benefit from acute therapy to maximize mobility, function, gait and strength to decrease burden of care.   HR 110 at rest, up to 151 with standing, 133 at rest end of session sats 91-95% on RA with activity, 98% end of session on return to 3L    Follow Up Recommendations Home health PT;Supervision/Assistance - 24 hour    Equipment Recommendations  Rolling walker with 5" wheels;3in1 (PT)    Recommendations for Other Services OT consult     Precautions / Restrictions Precautions Precautions: Fall Precaution Comments: watch HR Restrictions Weight Bearing Restrictions: No      Mobility  Bed Mobility Overal bed mobility: Needs Assistance Bed Mobility: Rolling;Sidelying to Sit Rolling: Min assist Sidelying to sit: Min assist;HOB elevated       General bed mobility comments: HOB 20 degrees with use of rail and assist to elevate trunk, increased time and cues  Transfers Overall transfer level: Needs assistance   Transfers: Sit to/from Stand;Stand Pivot Transfers Sit to Stand: Min assist;+2 safety/equipment;From elevated surface Stand pivot transfers: Min assist;+2 safety/equipment       General transfer comment: cues for hand placement, foot position and sequence. Min assist with RW to pivot bed to chair. Pt with posterior LOB and  assist to correct as well as assist to direct and move RW  Ambulation/Gait             General Gait Details: pt declined attempting today  Stairs            Wheelchair Mobility    Modified Rankin (Stroke Patients Only)       Balance Overall balance assessment: Needs assistance   Sitting balance-Leahy Scale: Good       Standing balance-Leahy Scale: Poor                               Pertinent Vitals/Pain Pain Assessment: No/denies pain    Home Living Family/patient expects to be discharged to:: Private residence Living Arrangements: Parent Available Help at Discharge: Family;Available 24 hours/day Type of Home: House Home Access: Stairs to enter   [employer] of Steps: 3 Home Layout: One level Home Equipment: None      Prior Function Level of Independence: Independent               Hand Dominance        Extremity/Trunk Assessment   Upper Extremity Assessment Upper Extremity Assessment: Generalized weakness    Lower Extremity Assessment Lower Extremity Assessment: Generalized weakness    Cervical / Trunk Assessment Cervical / Trunk Assessment: Normal  Communication   Communication: No difficulties  Cognition Arousal/Alertness: Awake/alert Behavior During Therapy: WFL for tasks assessed/performed Overall Cognitive Status: Impaired/Different from baseline Area of Impairment: Orientation                 Orientation Level: Time  General Comments      Exercises General Exercises - Lower Extremity Long Arc Quad: AROM;Both;Seated;15 reps Hip Flexion/Marching: AROM;15 reps;Both;Seated   Assessment/Plan    PT Assessment Patient needs continued PT services  PT Problem List Decreased strength;Decreased mobility;Decreased activity tolerance;Decreased balance;Decreased knowledge of use of DME;Cardiopulmonary status limiting activity       PT Treatment Interventions Gait  training;Therapeutic exercise;Patient/family education;Therapeutic activities;DME instruction;Stair training;Functional mobility training;Balance training    PT Goals (Current goals can be found in the Care Plan section)  Acute Rehab PT Goals Patient Stated Goal: return home PT Goal Formulation: With patient Time For Goal Achievement: 10/06/16 Potential to Achieve Goals: Good    Frequency Min 3X/week   Barriers to discharge Decreased caregiver support;Inaccessible home environment pt reports he assists mom and that sister can help but she works    Co-evaluation               AM-PAC PT "6 Clicks" Daily Activity  Outcome Measure Difficulty turning over in bed (including adjusting bedclothes, sheets and blankets)?: Total Difficulty moving from lying on back to sitting on the side of the bed? : Total Difficulty sitting down on and standing up from a chair with arms (e.g., wheelchair, bedside commode, etc,.)?: A Lot Help needed moving to and from a bed to chair (including a wheelchair)?: A Lot Help needed walking in hospital room?: A Lot Help needed climbing 3-5 steps with a railing? : A Lot 6 Click Score: 10    End of Session Equipment Utilized During Treatment: Gait belt Activity Tolerance: Patient tolerated treatment well Patient left: in chair;with call bell/phone within reach Nurse Communication: Mobility status PT Visit Diagnosis: Unsteadiness on feet (R26.81);Muscle weakness (generalized) (M62.81);Difficulty in walking, not elsewhere classified (R26.2)    Time: 3903-0092 PT Time Calculation (min) (ACUTE ONLY): 26 min   Charges:   PT Evaluation $PT Eval Moderate Complexity: 1 Procedure PT Treatments $Therapeutic Activity: 8-22 mins   PT G Codes:        Elwyn Reach, PT [phone]  [place] 09/22/2016, 2:21 PM

## 2016-09-22 NOTE — Plan of Care (Signed)
Problem: SLP Dysphagia Goals Goal: Patient will demonstrate readiness for PO's Patient will demonstrate readiness for PO's and/or instrumental swallow study as evidenced by: Ability to consume thin liquid trials without s/s of aspiration and with independent use of immediate throat clear and dry swallow.

## 2016-09-22 NOTE — Progress Notes (Signed)
PULMONARY  / CRITICAL CARE MEDICINE  Name: Paul [name] MRN: [id number] DOB: 05/09/1991    LOS: 65  REFERRING MD :  ED  CHIEF COMPLAINT:  "Cant breathe "   brief  26 yo male with DM , HTN , Hyperlipidemia , Renal Insufficiency and Morbid obesity presented to ER via EMS emergently with confusion and respiratory distress . On arrival found to have BS at 45. Given D50 by EMS . Pt w/ hypoxia,  tachypnea , temp 94. and in resp distress placed on BIPAP support. Says has had cold/bronchitic symptoms for last 2 weeks with cough/congestion . Seen by PCP 1 week ago, given Zpack with no improvement and progression of sob with lower extremity swelling. Labs returned showed severe acidosis with pH at 7.118, PCO2 43, PO 44, HCO3 14. Hbg 6.1, K 6.9 , Scr 15  . CXR showed diffuse interstitial marking ? puml edema . Lactic acid 0.45 . Troponin neg.  D Dimer 17. Pt was started on Vanc and Zosyn . b/p elevated on arrival , w/ no hypotension. In ER he was given lasix 57m x 1 , solumedrol x 1  . Given D50 x 2 , started on HCO3 drip , calcium given and albuterol given . On arrival to ER , pt in severe resp distress on BIPAP . Still alert but very agitated with increased wob, accessory use and tachypnea . Pt will require emergent intubation and Hemodialysis . Mother and Sister at bedside with update given .     LINES / TUBES: 4/22 ETT >> 5/2 4/22 HD cath >> 4/22 L IJ >> 5/2  CULTURES: 4/22 BC x 2 >> (-) 4/22 Sputum CX >> (-) 4/22 Viral panel negative 4/26 BAL/ Sputum Cx: (-) 4/27 BC x 2 >> (-)  ANTIBIOTICS: 4/22 Vanc/Zosyn x 1 4/22 Rocephin >> 4/23 4/22 Levaquin >> 4/25 4/27 Vanc/Zosyn >> 5/3  SIGNIFICANT EVENTS:  4/23: Intubated and dialyzed. 4/26: Self-extubated but reintubated complicated by mucus plugging of right mainstem bronchus. CRRT stopped.  4/27: Switched to intermittent HD. 5/2 : extubated  STUDIES:  Echo 4/22 EF 60-65%, moderate-sized pericardial effusion Renal [place]4/23: Medical renal  disease. CXR 4/23: Improved edema Echo 4/30 EF 60-65%, Loculated moderate sized pericardial effusion   SUBJECTIVE (-) issues overnight. Tolerating extubation and wore Bipap for presumptive OSA.  Fever pattern improved since extubation   VITAL SIGNS: Temp:  [97.4 F (36.3 C)-100.1 F (37.8 C)] 98.7 F (37.1 C) (05/03 1126) Pulse Rate:  [109-129] 120 (05/03 1000) Resp:  [12-35] 14 (05/03 1000) BP: (91-138)/(53-93) 122/91 (05/03 1000) SpO2:  [95 %-100 %] 97 % (05/03 1000) FiO2 (%):  [40 %] 40 % (05/03 0332) Weight:  [151 kg (332 lb 14.3 oz)-154 kg (339 lb 8.1 oz)] 151 kg (332 lb 14.3 oz) (05/03 0500)   HEMODYNAMICS:    VENTILATOR SETTINGS: Vent Mode: BIPAP;PCV FiO2 (%):  [40 %] 40 % Set Rate:  [15 bmp] 15 bmp PEEP:  [5 cmH20] 5 cmH20  INTAKE / OUTPUT: Intake/Output      05/02 0701 - 05/03 0700 05/03 0701 - 05/04 0700   I.V. (mL/kg) 120 (0.8)    NG/GT 150    IV Piggyback 287.5 50   Total Intake(mL/kg) 557.5 (3.7) 50 (0.3)   Urine (mL/kg/hr) 15 (0)    Other 0 (0)    Stool     Total Output 15     Net +542.5 +50         PHYSICAL EXAMINATION: Physical Exam  Constitutional:  He is well-developed, well-nourished, and in no distress. No distress.  obese  HENT:  Head: Normocephalic and atraumatic.  Fair cough. Able to speak  Eyes: Conjunctivae are normal. Right eye exhibits no discharge. Left eye exhibits no discharge. No scleral icterus.  Neck: Neck supple. No JVD present.  R HD cath in place  Cardiovascular: Normal rate and regular rhythm.   Pulmonary/Chest: Effort normal. No respiratory distress. He has rales.  Bibasilar crackles.   Abdominal: Soft. Bowel sounds are normal. He exhibits no distension and no mass. There is no rebound.  Musculoskeletal: He exhibits edema.  Neurological: He is alert.  Wiggles toes on command. Nods appropriately to questions.  Skin: He is not diaphoretic.     ASSESSMENT / PLAN:  PULMONARY A: Acute hypoxic hypercapneic  respiratory failure with  CAP (by history)/VAP +  pulmonary edema + untreated OSA/OHS. S/P extubation 5/2.  Mucus plugging of right mainstem bronchus on 4/26. Resolved.  P:   Keep o2 sats > 88% Bipap at HS for presumptive OSA/OHS and prn. Plan to wean off bipap until he is almost at baseline and ready to be discharged.  Will need sleep study as outpt.  Stop vanc and zosyn after today. Completed 7 days.  Cont HD    CARDIOVASCULAR A:  Pulmonary edema, improved HTN Moderate-sized pericardial effusion. Likely related to AKI/CKD.  Sinus tachycardia, likely related to hypovolemia.  P:  Continue home amlodipine 10 mg  and labetalol as needed Appreciate TCVS recommendations.  Pericardial effusion does not seem to be loculated.  It is just posteriorly located.  He is not in tamponade.  Pericardial effusion ikely related to uremia.  Should be better with HD.  Plan for f/u echo on 5/3 per TCVS.    Keep even with HD.  Sinus tach 2/2 hypovolemia.  Observing for now.    RENAL A:   Acute Renal Failure CKD P:   Cont  intermittent HD MWF. There is chance of renal recovery per renal.  Avoiding ct scan contrast if possible ANA+ on AI workup raises concern for drug-induced SLE. Histone ab is weakly (+) mCTD, Sjogren's, systemic sclerosis, RA markers (-) Keep euvolemic.    GASTROINTESTINAL A: Nausea and vomiting Soft stools P:   PPI >> will d/c  Failed swallow evaln. Re assess in am.  Observe diarrhea   HEMATOLOGIC A:   Anemia of chronic disease Sickle cell trait P:  Follow CBC   INFECTIOUS A:  Cultures have been (-).  Panscan was U/R except for bibasilar PNA and colonic thickening.  P:   Vanc/Zosyn for empiric coverage of VAP. Will complete 7d and stop after today.  Added Tylenol prn for throat pain, fever Per renal, plan to D/C HD cath in am and give him a line holiday and plan for tunneled cath next week.    ENDOCRINE A:   DM  Metabolic bone disease: Severe Vit D  6.8. PTH 159.  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 Received doxercalciferol on 4/22.    NEUROLOGIC A:   No issues P:   RASS goal: -1 Continue fentanyl and versed prn    Family :  Family updated at bedside on 5/2.   Plan to transfer to SDU.  TRH Dr. Reesa Chew aware of pt.  TRH will be primary starting 5/4, PCCM will sign off unless with issues.    Monica Becton, MD 09/22/2016, 11:40 AM [facility] Pager [phone] After 3 pm or if no answer, call [phone]

## 2016-09-22 NOTE — Progress Notes (Signed)
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 albuterol  2.5 mg Nebulization Q4H  . amLODipine  10 mg Per Tube Daily  . chlorhexidine gluconate (MEDLINE KIT)  15 mL Mouth Rinse BID  . Chlorhexidine Gluconate Cloth  6 each Topical Daily  . darbepoetin (ARANESP) injection - DIALYSIS  100 mcg Intravenous Q Mon-HD  . doxercalciferol  4 mcg Intravenous Once  . feeding supplement (PRO-STAT SUGAR FREE 64)  60 mL Per Tube TID  . feeding supplement (VITAL HIGH PROTEIN)  1,000 mL Per Tube Q24H  . heparin subcutaneous  5,000 Units Subcutaneous Q8H  . insulin aspart  1-3 Units Subcutaneous Q4H  . levothyroxine  75 mcg Intravenous Daily  . mouth rinse  15 mL Mouth Rinse QID  . pantoprazole sodium  40 mg Per Tube QHS  . sodium chloride flush  10-40 mL Intracatheter Q12H   Continuous Infusions: . sodium chloride    . sodium chloride    . sodium chloride 250 mL (09/19/16 0843)  . dextrose    . piperacillin-tazobactam (ZOSYN)  IV Stopped (09/22/16 0707)  . vancomycin Stopped (09/21/16 1400)   PRN Meds:.sodium chloride, sodium chloride, sodium chloride, acetaminophen (TYLENOL) oral liquid 160 mg/5 mL, albuterol, alteplase, dextrose, docusate, fentaNYL (SUBLIMAZE) injection, fentaNYL (SUBLIMAZE) injection, heparin, heparin, heparin, labetalol, lidocaine (PF), lidocaine-prilocaine, midazolam, midazolam, ondansetron (ZOFRAN) IV, pentafluoroprop-tetrafluoroeth  Current Labs: reviewed   Physical Exam:  Blood pressure 114/68, pulse (!)  119, temperature 98.4 F (36.9 C), temperature source Oral, resp. rate (!) 29, height 6' 2"  (1.88 m), weight (!) 151 kg (332 lb 14.3 oz), SpO2 99 %. Awake, alert, interactive Coarse bs/ b/l reghular, tachy 1+ LEE, pitting into shins R IJ temp HD cath  A 1. Anuric Dialysis depdendent AoCKD -- remains unclear if this how much acute vs chronic component 2. CAP / VAP, persistent fever, B Cx neg, CCM managing; Vanc/Zosyn 3. Sepsis, off pressors 4. VDRF 5. Anemia, on ESA 6. Pericardial effusion  P 1. Cont iHD MWF today, pull fluid as able: 3K, 2.5 Ca, tight hep, goal 2L 2. After HD tomorrow remove HD Cath ove weekend, hopefully can place [facility] on Monday 3. There is a possibility of renal recovery, largely since time course of renal faiulre is unclear (is this dominantly acute or progressive CKD...).  Kidneys were normal sized at admit Renal US.  Would use contrasted studies only if no viable other options at this time.     Pearson Grippe MD 09/22/2016, 7:53 AM   Recent Labs Lab 09/20/16 0430 09/21/16 0500 09/22/16 0500  NA 138 137 135  K 4.1 4.0 3.6  CL 94* 91* 90*  CO2 24 21* 24  GLUCOSE 135* 124* 118*  BUN 62* 99* 64*  CREATININE 7.29* 10.45* 8.00*  CALCIUM 8.8* 9.4 9.8  PHOS 6.5* 7.4* 7.7*    Recent Labs Lab 09/20/16 0430 09/21/16 0500 09/22/16 0500  WBC 18.3* 19.0* 17.7*  NEUTROABS 12.9* 13.3* 11.4*  HGB 10.2* 10.1* 10.3*  HCT 31.3* 30.9*  32.0*  MCV 78.3 77.6* 78.4  PLT 284 308 PENDING

## 2016-09-22 NOTE — Evaluation (Signed)
Clinical/Bedside Swallow Evaluation Patient Details  Name: Rolando Hessling MRN: [id number] Date of Birth: 01/02/1991  Today's Date: 09/22/2016 Time: SLP Start Time (ACUTE ONLY): 7322 SLP Stop Time (ACUTE ONLY): 0910 SLP Time Calculation (min) (ACUTE ONLY): 15 min  Past Medical History:  Past Medical History:  Diagnosis Date  . Diabetes mellitus   . Hypertension   . Thyroid disease    Past Surgical History: History reviewed. No pertinent surgical history. HPI:  45M with dialysis dependent AoCKD, sepsis, VDRF. Admitted to ER via EMS with confusion and respiratory distress, CAP. Intubated 4/22, self extubated 4/26 but reintubated and extubated 5/2.    Assessment / Plan / Recommendation Clinical Impression  Patient presents with a functional appearing oropharyngeal swallow with only subtle signs of aspiration however 10 day multiple intubation (with self extubation) and hoarse vocal quality raise concern for aspiration with high potential for silent aspiration. Discussed with patient who is in agreement for FEES this am. SLP Visit Diagnosis: Dysphagia, unspecified (R13.10)    Aspiration Risk       Diet Recommendation NPO   Medication Administration: Via alternative means    Other  Recommendations Oral Care Recommendations: Oral care QID   Follow up Recommendations        Frequency and Duration            Prognosis Prognosis for Safe Diet Advancement: Good      Swallow Study   General HPI: 45M with dialysis dependent AoCKD, sepsis, VDRF. Admitted to ER via EMS with confusion and respiratory distress, CAP. Intubated 4/22, self extubated 4/26 but reintubated and extubated 5/2.  Type of Study: Bedside Swallow Evaluation Previous Swallow Assessment: none Diet Prior to this Study: NPO Temperature Spikes Noted: No Respiratory Status: Nasal cannula History of Recent Intubation: Yes Length of Intubations (days): 10 days Date extubated: 09/21/16 Behavior/Cognition:  Alert;Cooperative;Pleasant mood Oral Cavity Assessment: Other (comment) (white lingual coating) Oral Care Completed by SLP: Recent completion by staff Oral Cavity - Dentition: Adequate natural dentition Vision: Functional for self-feeding Self-Feeding Abilities: Able to feed self Patient Positioning: Upright in bed Baseline Vocal Quality: Hoarse;Low vocal intensity Volitional Cough: Strong Volitional Swallow: Able to elicit    Oral/Motor/Sensory Function Overall Oral Motor/Sensory Function: Within functional limits   Ice Chips Ice chips: Within functional limits Presentation: Spoon   Thin Liquid Thin Liquid: Impaired Presentation: Cup;Straw Pharyngeal  Phase Impairments: Multiple swallows;Wet Vocal Quality    Nectar Thick Nectar Thick Liquid: Not tested   Honey Thick Honey Thick Liquid: Not tested   Puree Puree: Within functional limits Presentation: Spoon   Solid   GO  Mehdi Gironda MA, CCC-SLP [phone]  Solid: Within functional limits Presentation: Self Fed        Burna Atlas Meryl 09/22/2016,10:44 AM

## 2016-09-22 NOTE — Care Management Note (Signed)
Case Management Note  Patient Details  Name: Paul [name] MRN: [id number] Date of Birth: [birth year]  Subjective/Objective:    Pt admitted with  Insufficiency and Morbid obesity presented to ER via EMS emergently with confusion and respiratory distress                  Action/Plan:  Pt is intubated and on CRRT.  Mom and sister at bedside.  CM will continue to assess for discharge needs   Expected Discharge Date:                  Expected Discharge Plan:     In-House Referral:  Clinical Social Work  Discharge planning Services  CM Consult  Post Acute Care Choice:    Choice offered to:     DME Arranged:    DME Agency:     HH Arranged:    [place] Agency:     Status of Service:     If discussed at [employer], dates discussed:    Additional Comments: 09/22/2016 Discussed in LOS 09/22/16 - remains appropriate for continued stay.  Pt tentatively scheduled for extubation today post HD.   Pt is on MWF IHD schedule - per nephrology kidneys will likely not recover.  Remains on IV antibitoics Maryclare Labrador, RN 09/22/2016, 9:45 AM

## 2016-09-22 NOTE — Procedures (Addendum)
Objective Swallowing Evaluation: Type of Study: FEES-Fiberoptic Endoscopic Evaluation of Swallow  Patient Details  Name: Paul [name] MRN: [id number] Date of Birth: 1991/03/28  Today's Date: 09/22/2016 Time: SLP Start Time (ACUTE ONLY): 1015-SLP Stop Time (ACUTE ONLY): 1035 SLP Time Calculation (min) (ACUTE ONLY): 20 min  Past Medical History:  Past Medical History:  Diagnosis Date  . Diabetes mellitus   . Hypertension   . Thyroid disease    Past Surgical History: History reviewed. No pertinent surgical history. 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 Admitted to ER via EMS with confusion and respiratory distress, CAP. Intubated 4/22, self extubated 4/26 but reintubated and extubated 5/2.   No Data Recorded   Assessment / Plan / Recommendation  CHL IP CLINICAL IMPRESSIONS 09/22/2016  Clinical Impression Patient presents with a moderate pharyngeal phase dysphagia characterized by severe diffuse laryngeal and pharyngeal edema s/p prolonged, multiple intubations resulting in decreased laryngeal closure with resultant penetration and/or aspiration of all liquid consistencies as well as pureed solids. Although cued throat clear largely effective to clear the airway, patient with moderate thin secretions which mix with po pharyngeal residue and increase risk of post swallow aspiration. Suspect rapid recovery given age and what appears to be good laryngeal/pharyngeal strength. Decrease in edema and secretions likely to also positively impact function.  Recommend NPO except sips and chips after thorough oral care, frequent throat clearing post swallow to minimze aspiration. Will f/u with consideration of repeat FEES as early as 24 hours depending on bedside presentation.  SLP Visit Diagnosis Dysphagia, pharyngeal phase (R13.13)  Attention and concentration deficit following --  Frontal lobe and executive function deficit following --  Impact on safety and function Severe aspiration risk       CHL IP TREATMENT RECOMMENDATION 09/22/2016  Treatment Recommendations Therapy as outlined in treatment plan below     Prognosis 09/22/2016  Prognosis for Safe Diet Advancement Good  Barriers to Reach Goals --  Barriers/Prognosis Comment --    CHL IP DIET RECOMMENDATION 09/22/2016  SLP Diet Recommendations Ice chips PRN after oral care;NPO  Liquid Administration via --  Medication Administration Via alternative means  Compensations --  Postural Changes --      CHL IP OTHER RECOMMENDATIONS 09/22/2016  Recommended Consults --  Oral Care Recommendations Oral care QID  Other Recommendations --      CHL IP FOLLOW UP RECOMMENDATIONS 09/22/2016  Follow up Recommendations Other (comment)      CHL IP FREQUENCY AND DURATION 09/22/2016  Speech Therapy Frequency (ACUTE ONLY) min 3x week  Treatment Duration 2 weeks           CHL IP ORAL PHASE 09/22/2016  Oral Phase WFL  Oral - Pudding Teaspoon --  Oral - Pudding Cup --  Oral - Honey Teaspoon --  Oral - Honey Cup --  Oral - Nectar Teaspoon --  Oral - Nectar Cup --  Oral - Nectar Straw --  Oral - Thin Teaspoon --  Oral - Thin Cup --  Oral - Thin Straw --  Oral - Puree --  Oral - Mech Soft --  Oral - Regular --  Oral - Multi-Consistency --  Oral - Pill --  Oral Phase - Comment --    CHL IP PHARYNGEAL PHASE 09/22/2016  Pharyngeal Phase Impaired  Pharyngeal- Pudding Teaspoon --  Pharyngeal --  Pharyngeal- Pudding Cup --  Pharyngeal --  Pharyngeal- Honey Teaspoon Pharyngeal residue - valleculae;Pharyngeal residue - pyriform;Penetration/Aspiration during swallow;Penetration/Apiration after swallow;Trace aspiration;Reduced airway/laryngeal closure  Pharyngeal Material  enters airway, passes BELOW cords without attempt by patient to eject out (silent aspiration)  Pharyngeal- Honey Cup --  Pharyngeal --  Pharyngeal- Nectar Teaspoon Pharyngeal residue - valleculae;Pharyngeal residue - pyriform;Penetration/Aspiration during  swallow;Penetration/Apiration after swallow;Trace aspiration;Reduced airway/laryngeal closure  Pharyngeal Material enters airway, passes BELOW cords without attempt by patient to eject out (silent aspiration)  Pharyngeal- Nectar Cup --  Pharyngeal --  Pharyngeal- Nectar Straw --  Pharyngeal --  Pharyngeal- Thin Teaspoon Pharyngeal residue - valleculae;Pharyngeal residue - pyriform;Penetration/Aspiration during swallow;Penetration/Apiration after swallow;Trace aspiration;Reduced airway/laryngeal closure  Pharyngeal Material enters airway, CONTACTS cords and not ejected out;Material enters airway, passes BELOW cords without attempt by patient to eject out (silent aspiration)  Pharyngeal- Thin Cup Pharyngeal residue - valleculae;Pharyngeal residue - pyriform;Penetration/Aspiration during swallow;Penetration/Apiration after swallow;Trace aspiration;Reduced airway/laryngeal closure  Pharyngeal Material enters airway, CONTACTS cords and not ejected out;Material enters airway, passes BELOW cords without attempt by patient to eject out (silent aspiration)  Pharyngeal- Thin Straw --  Pharyngeal --  Pharyngeal- Puree Pharyngeal residue - valleculae;Pharyngeal residue - pyriform;Penetration/Aspiration during swallow;Penetration/Apiration after swallow;Trace aspiration;Reduced airway/laryngeal closure  Pharyngeal Material enters airway, CONTACTS cords and not ejected out  Pharyngeal- Mechanical Soft Reduced airway/laryngeal closure  Pharyngeal Material does not enter airway  Pharyngeal- Regular --  Pharyngeal --  Pharyngeal- Multi-consistency --  Pharyngeal --  Pharyngeal- Pill --  Pharyngeal --  Pharyngeal Comment --     CHL IP CERVICAL ESOPHAGEAL PHASE 09/22/2016  Cervical Esophageal Phase Impaired  Pudding Teaspoon --  Pudding Cup --  Honey Teaspoon --  Honey Cup --  Nectar Teaspoon --  Nectar Cup --  Nectar Straw --  Thin Teaspoon --  Thin Cup --  Thin Straw --  Puree --  Mechanical Soft  --  Regular --  Multi-consistency --  Pill --  Cervical Esophageal Comment suspect decreased opening due to edema    Gabriel Rainwater MA, CCC-SLP [phone]  [name] 09/22/2016, 10:55 AM

## 2016-09-22 NOTE — Progress Notes (Signed)
  Echocardiogram 2D Echocardiogram has been performed.  Lanyla Costello T Aryan Bello 09/22/2016, 12:41 PM

## 2016-09-22 NOTE — Progress Notes (Signed)
[facility] Progress Note Patient Name: Paul [name] DOB: [birth year] MRN: [id number]   Date of Service  09/22/2016  HPI/Events of Note  Patient c/o neck pain from lying in bed. Tylenol per tube ordered, however, the patient no longer has gastric tube. NPO - failed swallow study this AM.  eICU Interventions  Will order: 1. D/C Tylenol per tube. 2. Tylenol Suppository 650 mg PR Q 6 hours PRN pain.      Intervention Category Intermediate Interventions: Pain - evaluation and management  Sommer,Steven Eugene 09/22/2016, 6:04 PM

## 2016-09-22 NOTE — Progress Notes (Addendum)
Nutrition Follow-up  DOCUMENTATION CODES:   Morbid obesity  INTERVENTION:    Diet advancement as able per SLP / MD.  If unable to advance diet soon, consider NGT/Cortrak tube placement for TF.  Vital AF 1.2 at 80 ml/h (2304 kcal, 144 gm protein, 1557 ml free water daily) would meet nutrition needs.  NUTRITION DIAGNOSIS:   Inadequate oral intake related to inability to eat as evidenced by NPO status.  Ongoing  GOAL:   Patient will meet greater than or equal to 90% of their needs  Unmet  MONITOR:   Diet advancement, PO intake, Labs, Skin  ASSESSMENT:   26 yo male morbidly obese with HTN/DM presented with severe metabolic acidosis , acute renal failure and acute hypoxic resp failure with pulmonary edema.   Discussed patient with RN today. Patient was extubated 5/2.   Received HD yesterday. TF off since extubation. S/P FEES with SLP today. Patient to remain NPO today, may be able to repeat FEES tomorrow. Labs and medications reviewed. Phosphorus 7.7 (H), magnesium 2.6 (H)  Diet Order:  Diet NPO time specified  Skin:  Wound (see comment) (Stage II to groin and sacrum)  Last BM:  5/2  Height:   Ht Readings from Last 1 Encounters:  09/11/16 6\' 2"  (1.88 m)    Weight:   Wt Readings from Last 1 Encounters:  09/22/16 (!) 332 lb 14.3 oz (151 kg)    Ideal Body Weight:  86.4 kg  BMI:  Body mass index is 42.74 kg/m.  Estimated Nutritional Needs:   Kcal:  4360-6770  Protein:  150-175 gm  Fluid:  2.5 L  EDUCATION NEEDS:   No education needs identified at this time  Molli Barrows, [place], [place], [place] Pager [phone] After Hours Pager [phone]

## 2016-09-23 DIAGNOSIS — I313 Pericardial effusion (noninflammatory): Secondary | ICD-10-CM

## 2016-09-23 DIAGNOSIS — R0902 Hypoxemia: Secondary | ICD-10-CM

## 2016-09-23 LAB — GLUCOSE, CAPILLARY
GLUCOSE-CAPILLARY: 104 mg/dL — AB (ref 65–99)
GLUCOSE-CAPILLARY: 102 mg/dL — AB (ref 65–99)
Glucose-Capillary: 103 mg/dL — ABNORMAL HIGH (ref 65–99)
Glucose-Capillary: 98 mg/dL (ref 65–99)
Glucose-Capillary: 98 mg/dL (ref 65–99)

## 2016-09-23 LAB — RENAL FUNCTION PANEL
ANION GAP: 24 — AB (ref 5–15)
Albumin: 2.7 g/dL — ABNORMAL LOW (ref 3.5–5.0)
BUN: 99 mg/dL — ABNORMAL HIGH (ref 6–20)
CO2: 21 mmol/L — AB (ref 22–32)
Calcium: 9.4 mg/dL (ref 8.9–10.3)
Chloride: 87 mmol/L — ABNORMAL LOW (ref 101–111)
Creatinine, Ser: 11.04 mg/dL — ABNORMAL HIGH (ref 0.61–1.24)
GFR calc Af Amer: 7 mL/min — ABNORMAL LOW (ref >60)
GFR calc non Af Amer: 6 mL/min — ABNORMAL LOW (ref >60)
GLUCOSE: 99 mg/dL (ref 65–99)
Phosphorus: 9.9 mg/dL — ABNORMAL HIGH (ref 2.5–4.6)
Potassium: 3.4 mmol/L — ABNORMAL LOW (ref 3.5–5.1)
Sodium: 132 mmol/L — ABNORMAL LOW (ref 135–145)

## 2016-09-23 LAB — CBC WITH DIFFERENTIAL/PLATELET
BASOS PCT: 1 %
Basophils Absolute: 0.2 10*3/uL — ABNORMAL HIGH (ref 0.0–0.1)
EOS PCT: 2 %
Eosinophils Absolute: 0.3 10*3/uL (ref 0.0–0.7)
HEMATOCRIT: 30.4 % — AB (ref 39.0–52.0)
Hemoglobin: 10 g/dL — ABNORMAL LOW (ref 13.0–17.0)
LYMPHS ABS: 2.4 10*3/uL (ref 0.7–4.0)
Lymphocytes Relative: 16 %
MCH: 25.5 pg — ABNORMAL LOW (ref 26.0–34.0)
MCHC: 32.9 g/dL (ref 30.0–36.0)
MCV: 77.6 fL — AB (ref 78.0–100.0)
MONO ABS: 2.8 10*3/uL — AB (ref 0.1–1.0)
Monocytes Relative: 18 %
Neutro Abs: 9.6 10*3/uL — ABNORMAL HIGH (ref 1.7–7.7)
Neutrophils Relative %: 63 %
Platelets: 248 10*3/uL (ref 150–400)
RBC: 3.92 MIL/uL — ABNORMAL LOW (ref 4.22–5.81)
RDW: 18.3 % — AB (ref 11.5–15.5)
WBC: 15.3 10*3/uL — AB (ref 4.0–10.5)

## 2016-09-23 LAB — MAGNESIUM: Magnesium: 2.9 mg/dL — ABNORMAL HIGH (ref 1.7–2.4)

## 2016-09-23 MED ORDER — HEPARIN SODIUM (PORCINE) 1000 UNIT/ML DIALYSIS: 20.0000 [IU]/kg | INTRAMUSCULAR | Status: DC | PRN

## 2016-09-23 MED ORDER — OXYCODONE HCL 5 MG PO TABS: 5.0000 mg | ORAL_TABLET | Freq: Four times a day (QID) | ORAL | Status: DC | PRN

## 2016-09-23 MED ORDER — LEVALBUTEROL HCL 1.25 MG/0.5ML IN NEBU
1.2500 mg | INHALATION_SOLUTION | Freq: Four times a day (QID) | RESPIRATORY_TRACT | Status: DC
  Administered 2016-09-23 – 2016-09-24 (×6): 1.25 mg via RESPIRATORY_TRACT
  Filled 2016-09-23 (×11): qty 0.5

## 2016-09-23 MED ORDER — HYDROMORPHONE HCL 1 MG/ML IJ SOLN
0.5000 mg | INTRAMUSCULAR | Status: DC | PRN
  Administered 2016-09-23 – 2016-09-26 (×4): 0.5 mg via INTRAVENOUS
  Filled 2016-09-23 (×4): qty 1

## 2016-09-23 NOTE — Consult Note (Signed)
Hospital Consult    Reason for Consult:  In need of tunneled dialysis catheter and permanent dialysis access Referring Physician:  Joelyn Oms MRN #:  [id number]  History of Present Illness: This is a 26 y.o. male who was admitted with difficulty breathing and leg swelling.  He was emergently intubated and found to be in acute on chronic renal failure and treated with dialysis.  He was extubated a couple of days ago.  He has had a swallow eval and is high risk for aspiration and is npo.  His pulmonary edema has improved.  He had echo on 09/22/16, which revealed a moderate pericardial effusion but in comparison to to echo on 5/1, it is smaller.  He has an EF of 65-70%.    He is on insulin for diabetes.  He is on a statin for cholesterol management.  He is on a CCB for blood pressure control.  Pt on BiPap for presumptive OSA.  VVS is asked to consult for tunneled dialysis catheter and permanent HD access.  Pt is right hand dominant.     Past Medical History:  Diagnosis Date  . Diabetes mellitus   . Hypertension   . Thyroid disease     History reviewed. No pertinent surgical history.  Allergies  Allergen Reactions  . Hydralazine Hcl Other (See Comments)    Drug-induced Lupus    Prior to Admission medications   Medication Sig Start Date End Date Taking? Authorizing Provider  amLODipine (NORVASC) 10 MG tablet Take 1 tablet (10 mg total) by mouth daily. 11/28/14  Yes Mariel Aloe, MD  insulin glargine (LANTUS) 100 UNIT/ML injection Inject 50 u of insulin into the skin BID Patient taking differently: Inject 50 Units into the skin 2 (two) times daily. Inject 50 u of insulin into the skin BID 05/30/16  Yes Janne Napoleon, NP  levothyroxine (SYNTHROID, LEVOTHROID) 150 MCG tablet Take 1 tablet (150 mcg total) by mouth daily. Needs office visit 01/01/16  Yes Ashly Windell Moulding, DO  rosuvastatin (CRESTOR) 20 MG tablet Take 1 tablet (20 mg total) by mouth daily. 11/28/14  Yes Mariel Aloe, MD    ACCU-CHEK FASTCLIX LANCETS MISC 1 each by Does not apply route 4 (four) times daily -  before meals and at bedtime. 03/21/14   Mariel Aloe, MD  Blood Glucose Monitoring Suppl (ACCU-CHEK NANO SMARTVIEW) W/DEVICE KIT 1 Device by Does not apply route 4 (four) times daily -  before meals and at bedtime. 03/21/14   Mariel Aloe, MD  ciprofloxacin (CIPRO) 500 MG tablet Take 1 tablet (500 mg total) by mouth every 12 (twelve) hours. Patient not taking: Reported on 09/11/2016 04/21/16   Fransico Meadow, PA-C  glucose blood test strip Use as instructed 03/21/14   Mariel Aloe, MD    Social History   Social History  . Marital status: Single    Spouse name: N/A  . Number of children: N/A  . Years of education: N/A   Occupational History  . Not on file.   Social History Main Topics  . Smoking status: Never Smoker  . Smokeless tobacco: Never Used  . Alcohol use No  . Drug use: No  . Sexual activity: Not on file   Other Topics Concern  . Not on file   Social History Narrative  . No narrative on file     Family History  Problem Relation Age of Onset  . Heart disease Mother   . Hypertension Mother  ROS: _0  Positive   _1  Negative   _2  All sytems reviewed and are negative  Cardiovascular: _3  chest pain/pressure _4  palpitations _5  SOB _6  DOE _7  pain in legs while walking _8  pain in legs at rest _9  pain in legs at night _10  non-healing ulcers _11  hx of DVT _12  swelling in legs  Pulmonary: _13  productive cough _14  asthma/wheezing _15  home O2  Neurologic: _16  weakness in _17  arms _18  legs _19  numbness in _20  arms _21  legs _22  hx of CVA _23  mini stroke _24 difficulty speaking or slurred speech _25  temporary loss of vision in one eye _26  dizziness  Hematologic: _27  hx of cancer _28  bleeding problems _29  problems with blood clotting easily  Endocrine:   _30  diabetes _31  thyroid disease  GI _32  vomiting blood _33  blood in stool  GU: _34  CKD/renal failure _35  HD--_36  M/W/F or  _37  T/T/S _38  burning with urination _39  blood in urine  Psychiatric: _40  anxiety _41  depression  Musculoskeletal: _42  arthritis _43  joint pain  Integumentary: _44  rashes _45  ulcers  Constitutional: _46  fever _47  chills   Physical Examination  Vitals:   09/23/16 1200 09/23/16 1225  BP: (!) 139/57 (!) 132/58  Pulse: (!) 131 (!) 116  Resp: 18 18  Temp:  98.2 F (36.8 C)   Body mass index is 42.09 kg/m.  General:  WDWN in NAD Gait: Not observed HENT: WNL, normocephalic Pulmonary: normal non-labored breathing, without Rales, rhonchi,  wheezing Cardiac:  Regular/tachy without  Murmurs, rubs or gallops; bandage on left neck and IV right neck therefore carotids not examined Abdomen: obese, soft, NT/ND, no masses Skin: without rashes Vascular Exam/Pulses:  Right Left  Radial 2+ (normal) 2+ (normal)  Ulnar 1+ (weak) 1+ (weak)  DP 2+ (normal) 1+ (weak)   Extremities: without ischemic changes, without Gangrene , without cellulitis; without open wounds;  Musculoskeletal: no muscle wasting or atrophy  Neurologic: A&O X 3;  No focal weakness or paresthesias are detected; speech is fluent/normal Psychiatric:  The pt has Normal affect.  CBC    Component Value Date/Time   WBC 15.3 (H) 09/23/2016 0500   RBC 3.92 (L) 09/23/2016 0500   HGB 10.0 (L) 09/23/2016 0500   HCT 30.4 (L) 09/23/2016 0500   PLT 248 09/23/2016 0500   MCV 77.6 (L) 09/23/2016 0500   MCH 25.5 (L) 09/23/2016 0500   MCHC 32.9 09/23/2016 0500   RDW 18.3 (H) 09/23/2016 0500   LYMPHSABS 2.4 09/23/2016 0500   MONOABS 2.8 (H) 09/23/2016 0500   EOSABS 0.3 09/23/2016 0500   BASOSABS 0.2 (H) 09/23/2016 0500    BMET    Component Value Date/Time   NA 132 (L) 09/23/2016 0500   K 3.4 (L) 09/23/2016 0500   CL 87 (L) 09/23/2016 0500   CO2 21 (L) 09/23/2016 0500   GLUCOSE 99 09/23/2016 0500   BUN 99 (H) 09/23/2016 0500   CREATININE 11.04 (H) 09/23/2016 0500   CREATININE 1.85 (H) 11/28/2014 1153   CALCIUM 9.4  09/23/2016 0500   GFRNONAA 6 (L) 09/23/2016 0500   GFRNONAA 51 (L) 11/28/2014 1153   GFRAA 7 (L) 09/23/2016 0500   GFRAA 58 (L) 11/28/2014 1153    COAGS: Lab Results  Component Value Date   INR 1.44 09/11/2016     Non-Invasive Vascular Imaging:   Vein mapping ordered 09/23/16  Vascular Ultrasound 09/20/16 Lower extremity venous duplex has been completed.  Preliminary findings: No evidence of DVT in visualized veins or baker's cyst.  Statin:  Yes.   Beta Blocker:  No. Aspirin:  No. ACEI:  No. ARB:  No. CCB use:  Yes Other antiplatelets/anticoagulants:  Yes.   SQ heparin   ASSESSMENT/PLAN: This is a 26 y.o. male with acute on chronic renal failure   -pt is right hand dominant.   -will need BUE vein mapping, which I have ordered this afternoon.  -pt with leukocytosis, which is improving and is 15k today - pt on Vanc/Zosyn.  Pt's temporary catheter to be removed today for line holiday with plan for [facility] next week. -discussed possible options of fistula vs graft and catheter placement with pt and his mother.   -plan for access next week.   -Dr. Oneida Alar will be in to see pt this afternoon.   Leontine Locket, PA-C [facility] [phone]   Agree with above.  Pt is right handed so will try to do left arm access.  Obesity is going to make permanent access problematic. Will follow up on vein mapping this weekend.  Possible perm access and catheter either 5/7 or 5/9.  Ruta Hinds, MD [facility] [place] Office: [phone] Pager: [phone]

## 2016-09-23 NOTE — Care Management Note (Signed)
Case Management Note  Patient Details  Name: Paul [name] MRN: [id number] Date of Birth: [birth year]  Subjective/Objective:  From home with mom, presents with Acute resp failure, Acute Renal Failure, Acute pulmonary edema, HCAP, Vent assc Pna, AKI.  Per pt eval recs HHPT , patient does not have any insurance,has applied for medicaid but this is a long process. Patient states he would like to go back to [facility], NCM called to see if could get him scheduled with someone there , they states Dr. Ronnie Doss will be his PCP there, he will also need to contact [facility] at [phone] to update his orange card information.  NCM will give this information to patient and his mom.  Do not have a dc date for patient at this time but as soon as we know will need to call Family Medicine to schedule a hospital follow up with Dr. Lajuana Ripple.                     Action/Plan: NCM will cont to follow for dc needs.  Expected Discharge Date:                  Expected Discharge Plan:  Home/Self Care  In-House Referral:  Clinical Social Work  Discharge planning Services  CM Consult  Post Acute Care Choice:    Choice offered to:     DME Arranged:    DME Agency:     HH Arranged:    HH Agency:     Status of Service:  In process, will continue to follow  If discussed at Long Length of Stay Meetings, dates discussed:    Additional Comments:  Zenon Mayo, RN 09/23/2016, 3:33 PM

## 2016-09-23 NOTE — Progress Notes (Signed)
Admit: 09/11/2016 LOS: 12  73M with dialysis dependent AoCKD, sepsis, VDRF  Subjective:  Out of ICU For HD today No UOP AFebrile; WBC downtrending; off ABX   05/03 0701 - 05/04 0700 In: 140 [I.V.:40; IV Piggyback:100] Out: -   Filed Weights   09/21/16 1408 09/22/16 0500 09/23/16 0500  Weight: (!) 154 kg (339 lb 8.1 oz) (!) 151 kg (332 lb 14.3 oz) (!) 149 kg (328 lb 7.8 oz)    Scheduled Meds: . albuterol  2.5 mg Nebulization Q4H  . amLODipine  10 mg Per Tube Daily  . chlorhexidine gluconate (MEDLINE KIT)  15 mL Mouth Rinse BID  . Chlorhexidine Gluconate Cloth  6 each Topical Daily  . darbepoetin (ARANESP) injection - DIALYSIS  100 mcg Intravenous Q Mon-HD  . doxercalciferol  4 mcg Intravenous Once  . heparin subcutaneous  5,000 Units Subcutaneous Q8H  . insulin aspart  1-3 Units Subcutaneous Q4H  . levothyroxine  75 mcg Intravenous Daily  . mouth rinse  15 mL Mouth Rinse QID  . sodium chloride flush  10-40 mL Intracatheter Q12H   Continuous Infusions: . sodium chloride 100 mL (09/22/16 2100)  . sodium chloride    . sodium chloride 250 mL (09/19/16 0843)  . dextrose     PRN Meds:.sodium chloride, sodium chloride, sodium chloride, acetaminophen, albuterol, alteplase, dextrose, docusate, heparin, heparin, heparin, labetalol, lidocaine (PF), lidocaine-prilocaine, ondansetron (ZOFRAN) IV, pentafluoroprop-tetrafluoroeth  Current Labs: reviewed   Physical Exam:  Blood pressure (!) 139/91, pulse (!) 104, temperature 100 F (37.8 C), temperature source Oral, resp. rate (!) 23, height _0  (1.88 m), weight (!) 149 kg (328 lb 7.8 oz), SpO2 100 %. Awake, alert, interactive Coarse bs/ b/l reghular, tachy 1+ LEE, pitting into shins R IJ temp HD cath  A 1. Anuric Dialysis depdendent AoCKD -- remains unclear if this how much acute vs chronic component 2. CAP / VAP, persistent fever, B Cx NGF, Vanc/Zosyn completed 3. Sepsis, off pressors 4. VDRF; resolving 5. Anemia, on ESA; no  iron as of now w/ recent infection 6. Pericardial effusion 7. Hyperphosphatemia: follow for now with HD, once PO established consider binders 8. Mild 2HPTH at admission; no need for VDRA or cinacalcet  P 1. Cont iHD MWF today, pull fluid as able: 4K, 2.5 Ca, tight hep, goal 2L 2. After HD remove temp HD Cath; will ask VVS to eval for access next week and will make sure they can accomodate prior to removal.   3. There is a possibility of renal recovery, largely since time course of renal faiulre is unclear (is this dominantly acute or progressive CKD...).  Kidneys were normal sized at admit Renal US.  Would use contrasted studies only if no viable other options at this time.     Pearson Grippe MD 09/23/2016, 8:02 AM   Recent Labs Lab 09/21/16 0500 09/22/16 0500 09/23/16 0500  NA 137 135 132*  K 4.0 3.6 3.4*  CL 91* 90* 87*  CO2 21* 24 21*  GLUCOSE 124* 118* 99  BUN 99* 64* 99*  CREATININE 10.45* 8.00* 11.04*  CALCIUM 9.4 9.8 9.4  PHOS 7.4* 7.7* 9.9*    Recent Labs Lab 09/21/16 0500 09/22/16 0500 09/23/16 0500  WBC 19.0* 17.7* 15.3*  NEUTROABS 13.3* 11.4* 9.6*  HGB 10.1* 10.3* 10.0*  HCT 30.9* 32.0* 30.4*  MCV 77.6* 78.4 77.6*  PLT 308 267 248

## 2016-09-23 NOTE — Progress Notes (Signed)
PROGRESS NOTE    Paul [name]  FXT:[id number] DOB: 1990/10/07 DOA: 09/11/2016 PCP: No PCP Per Patient   Brief Narrative:  26 year old male with past medical history of diabetes, hypertension, hyperlipidemia, renal insufficiency and morbid obesity presented to the ER with complaints of confusion respiratory distress. He was noted to be hypoxic and tachypnea to the ER therefore placed on BiPAP. About a week prior to admission he was placed on Z-Pak by his PCP without any improvement. In the ER he was started on vancomycin and Zosyn. He was noted to have significantly elevated creatinine and was almost and uric therefore emergent dialysis was performed. He was also intubated due to no improvement for BiPAP. His cultures*far remain negative and he has been treated with antibiotics and completed the course of vancomycin and Zosyn on 09/22/2016. During his time in ICU actually self extubated but had to be reintubated on April 26 due to mucous plugging in the right main bronchus. While in the ICU he received CRRT later which was transitioned to intermittent dialysis on 09/16/2016. Initial echocardiogram on April 22 showed ejection fraction of 60-65 percent with moderate sized pericardial effusion but with dialysis it has improved and repeat echocardiogram shows small effusion compared to the one from the time of admission. Renal ultrasound shows medical disease.   Assessment & Plan:   Active Problems:   Acute hypoxemic respiratory failure (HCC)   Pressure injury of skin   Acute renal failure (ARF) (HCC)   Metabolic acidosis   Acute pulmonary edema (HCC)   Respiratory distress   HAP (hospital-acquired pneumonia)   VAP (ventilator-associated pneumonia) (HCC)   AKI (acute kidney injury) ([place])  Acute hypoxic/hypercapnic respiratory failure -Likely multifactorial in nature, pneumonia, pulmonary edema, untreated obstructive sleep apnea and mucus plugging -Currently his respiratory status is stable and  currently weaned off to BiPAP only requiring it at night. We will watch him closely and stepdown unit today  -Supplemental O2 as needed -Completed his course of antibiotics  Pulmonary edema/moderate sized pericardial effusion -Repeat echocardiogram yesterday shows improvement in smaller pericardial effusion -Suspect this could be from uremia. 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 History of anybody is mildly positive -Nephrology is following. Plans for his HD catheter to come out today. Vascular surgery has been consulted by nephrology for possible placement of tunnel catheter versus long-term access hopefully on Monday. -Triple-lumen catheter can be removed today after his dialysis  Anemia of chronic diseases Sickle cell trait -Continue monitor hemoglobin. No active signs of infection at this time  Elevated Phos, now on HD Mild Secondary HyperPTH - defer the need for Cinacalcet to nephro   HTN -cont Norvasc 61m po daily and labetalol prn   DVT prophylaxis: hep subQ Code Status: Full  Family Communication:  None at bedside  Disposition Plan: TBD  Consultants:   nephro   Procedures:   FEES 5/3  Bronchoscopy 4/26  CRRT 4/24  Antimicrobials:  4/22 Vanc/Zosyn x 1 4/22 Rocephin >> 4/23 4/22 Levaquin >> 4/25 4/27 Vanc/Zosyn >> 5/3    Subjective: Tolerated HD well today. No other ocmplaints.   Objective: Vitals:   09/23/16 1100 09/23/16 1130 09/23/16 1200 09/23/16 1225  BP: (!) 149/75 (!) 155/77 (!) 139/57 (!) 132/58  Pulse: (!) 118 (!) 136 (!) 131 (!) 116  Resp: 18 17 18 18   Temp:    98.2 F (36.8 C)  TempSrc:  Oral  SpO2: 99% 98% 98% 98%  Weight:    (!) 148.7 kg (327 lb 13.2 oz)  Height:        Intake/Output Summary (Last 24 hours) at 09/23/16 1332 Last data filed at 09/23/16 1225  Gross per 24 hour  Intake               100 ml  Output             1345 ml  Net            -1245 ml   Filed Weights   09/23/16 0500 09/23/16 0815 09/23/16 1225  Weight: (!) 149 kg (328 lb 7.8 oz) (!) 150 kg (330 lb 11 oz) (!) 148.7 kg (327 lb 13.2 oz)    Examination:  General exam: Appears calm and comfortable. obese Respiratory system: Clear to auscultation. Respiratory effort normal. Cardiovascular system: S1 & S2 heard, RRR. No JVD, murmurs, rubs, gallops or clicks. No pedal edema. R IJ HD catheter in place  Gastrointestinal system: Abdomen is nondistended, soft and nontender. No organomegaly or masses felt. Normal bowel sounds heard. Central nervous system: Alert. Follows some commands  Extremities: Symmetric 5 x 5 power. Skin: No rashes, lesions or ulcers Psychiatry: Judgement and insight appear normal. Mood & affect appropriate.     Data Reviewed:   CBC:  Recent Labs Lab 09/19/16 0411 09/20/16 0430 09/21/16 0500 09/22/16 0500 09/23/16 0500  WBC 18.3* 18.3* 19.0* 17.7* 15.3*  NEUTROABS 13.8* 12.9* 13.3* 11.4* 9.6*  HGB 9.5* 10.2* 10.1* 10.3* 10.0*  HCT 29.3* 31.3* 30.9* 32.0* 30.4*  MCV 77.5* 78.3 77.6* 78.4 77.6*  PLT 290 284 308 267 147   Basic Metabolic Panel:  Recent Labs Lab 09/19/16 0411 09/19/16 0413 09/20/16 0430 09/21/16 0500 09/22/16 0500 09/23/16 0500  NA  --  137 138 137 135 132*  K  --  3.9 4.1 4.0 3.6 3.4*  CL  --  91* 94* 91* 90* 87*  CO2  --  23 24 21* 24 21*  GLUCOSE  --  141* 135* 124* 118* 99  BUN  --  80* 62* 99* 64* 99*  CREATININE  --  8.32* 7.29* 10.45* 8.00* 11.04*  CALCIUM  --  7.7* 8.8* 9.4 9.8 9.4  MG 2.7*  --  3.0* 3.0* 2.6* 2.9*  PHOS  --  8.8* 6.5* 7.4* 7.7* 9.9*   GFR: Estimated Creatinine Clearance: 15.9 mL/min (A) (by C-G formula based on SCr of 11.04 mg/dL (H)). Liver Function Tests:  Recent Labs Lab 09/19/16 0413 09/20/16 0430 09/21/16 0500 09/22/16 0500 09/23/16 0500  ALBUMIN 2.6* 2.8* 2.8* 2.9* 2.7*   No results for input(s):  LIPASE, AMYLASE in the last 168 hours. No results for input(s): AMMONIA in the last 168 hours. Coagulation Profile: No results for input(s): INR, PROTIME in the last 168 hours. Cardiac Enzymes:  Recent Labs Lab 09/19/16 1313 09/19/16 1859 09/20/16 0030 09/20/16 0615  TROPONINI <0.03 0.03* <0.03 0.12*   BNP (last 3 results) No results for input(s): PROBNP in the last 8760 hours. HbA1C: No results for input(s): HGBA1C in the last 72 hours. CBG:  Recent Labs Lab 09/22/16 2047 09/22/16 2342 09/23/16 0348 09/23/16 0808 09/23/16 1247  GLUCAP 103* 98 104* 102* 103*   Lipid Profile: No results for input(s): CHOL, HDL, LDLCALC, TRIG, CHOLHDL, LDLDIRECT in the last 72 hours. Thyroid Function Tests: No results for input(s): TSH, T4TOTAL, FREET4, T3FREE, THYROIDAB in the last 72 hours. Anemia Panel: No results for input(s): VITAMINB12,  FOLATE, FERRITIN, TIBC, IRON, RETICCTPCT in the last 72 hours. Sepsis Labs:  Recent Labs Lab 09/17/16 0520 09/18/16 0434  PROCALCITON 5.64 5.30    Recent Results (from the past 240 hour(s))  Culture, respiratory (NON-Expectorated)     Status: None   Collection Time: 09/15/16  4:00 PM  Result Value Ref Range Status   Specimen Description BRONCHIAL ALVEOLAR LAVAGE  Final   Special Requests NONE  Final   Gram Stain   Final    ABUNDANT WBC PRESENT,BOTH PMN AND MONONUCLEAR MODERATE GRAM NEGATIVE RODS FEW GRAM NEGATIVE COCCI IN PAIRS MODERATE GRAM POSITIVE COCCI IN PAIRS FEW GRAM POSITIVE RODS RARE YEAST ABUNDANT SQUAMOUS EPITHELIAL CELLS PRESENT    Culture Consistent with normal respiratory flora.  Final   Report Status 09/18/2016 FINAL  Final  Culture, blood (Routine X 2) w Reflex to ID Panel     Status: None   Collection Time: 09/16/16  7:58 AM  Result Value Ref Range Status   Specimen Description BLOOD RIGHT HAND  Final   Special Requests   Final    BOTTLES DRAWN AEROBIC AND ANAEROBIC Blood Culture adequate volume   Culture NO  GROWTH 5 DAYS  Final   Report Status 09/21/2016 FINAL  Final  Culture, blood (Routine X 2) w Reflex to ID Panel     Status: None   Collection Time: 09/16/16  8:04 AM  Result Value Ref Range Status   Specimen Description BLOOD LEFT HAND  Final   Special Requests   Final    BOTTLES DRAWN AEROBIC AND ANAEROBIC Blood Culture adequate volume   Culture NO GROWTH 5 DAYS  Final   Report Status 09/21/2016 FINAL  Final         Radiology Studies: No results found.      Scheduled Meds: . amLODipine  10 mg Per Tube Daily  . chlorhexidine gluconate (MEDLINE KIT)  15 mL Mouth Rinse BID  . Chlorhexidine Gluconate Cloth  6 each Topical Daily  . darbepoetin (ARANESP) injection - DIALYSIS  100 mcg Intravenous Q Mon-HD  . heparin subcutaneous  5,000 Units Subcutaneous Q8H  . insulin aspart  1-3 Units Subcutaneous Q4H  . levalbuterol  1.25 mg Nebulization Q6H  . levothyroxine  75 mcg Intravenous Daily  . mouth rinse  15 mL Mouth Rinse QID  . sodium chloride flush  10-40 mL Intracatheter Q12H   Continuous Infusions: . sodium chloride 100 mL (09/22/16 2100)  . sodium chloride    . sodium chloride 250 mL (09/19/16 0843)  . dextrose       LOS: 12 days    Time spent: 35 mins     Paul Hunnicutt Arsenio Loader, MD [facility] Pager [phone]   If 7PM-7AM, please contact night-coverage [email or web address] Password [email or web address] 09/23/2016, 1:32 PM

## 2016-09-23 NOTE — Progress Notes (Signed)
Physical Therapy Cancellation Note   09/23/16 0919  PT Visit Information  Last PT Received On 09/23/16  Reason Eval/Treat Not Completed Patient at procedure or test/unavailable  History of Present Illness 26 yo admitted with sepsis, respiratory failure and renal failure s/p VDRF 4/22-4/26, 4/26-5/2, and CRRT. PMHx: obesity, DM, HTN, renal insufficiency    Earney Navy, PTA Pager: [phone]

## 2016-09-23 NOTE — Progress Notes (Signed)
  Subjective: Echo done yesterday shows improvement of pericardiasl effusion w/o tampanode This is most likely uremic in etiology and should continue to improve with HD  Objective: Vital signs in last 24 hours: Temp:  [98 F (36.7 C)-100 F (37.8 C)] 98.2 F (36.8 C) (05/04 1225) Pulse Rate:  [101-136] 116 (05/04 1225) Cardiac Rhythm: Sinus tachycardia (05/04 1245) Resp:  [13-37] 18 (05/04 1225) BP: (115-163)/(55-91) 132/58 (05/04 1225) SpO2:  [90 %-100 %] 98 % (05/04 1225) FiO2 (%):  [40 %] 40 % (05/04 0459) Weight:  [327 lb 13.2 oz (148.7 kg)-330 lb 11 oz (150 kg)] 327 lb 13.2 oz (148.7 kg) (05/04 1225)  Hemodynamic parameters for last 24 hours:   stable Intake/Output from previous day: 05/03 0701 - 05/04 0700 In: 140 [I.V.:40; IV Piggyback:100] Out: -  Intake/Output this shift: Total I/O In: 10 [I.V.:10] Out: 1345 [Other:1345]    Lab Results:  Recent Labs  09/22/16 0500 09/23/16 0500  WBC 17.7* 15.3*  HGB 10.3* 10.0*  HCT 32.0* 30.4*  PLT 267 248   BMET:  Recent Labs  09/22/16 0500 09/23/16 0500  NA 135 132*  K 3.6 3.4*  CL 90* 87*  CO2 24 21*  GLUCOSE 118* 99  BUN 64* 99*  CREATININE 8.00* 11.04*  CALCIUM 9.8 9.4    PT/INR: No results for input(s): LABPROT, INR in the last 72 hours. ABG    Component Value Date/Time   PHART 7.398 09/16/2016 1129   HCO3 20.9 09/16/2016 1129   TCO2 22 09/16/2016 1129   ACIDBASEDEF 3.0 (H) 09/16/2016 1129   O2SAT 99.0 09/16/2016 1129   CBG (last 3)   Recent Labs  09/23/16 0348 09/23/16 0808 09/23/16 1247  GLUCAP 104* 102* 103*    Assessment/Plan: S/P  Mild pericardial effusion No indication for drainage at this time   LOS: 12 days    Tharon Aquas Trigt III 09/23/2016

## 2016-09-23 NOTE — Progress Notes (Signed)
09/23/2016- Respiratory care note- Pt placed on BIPAP for the night.  Tolerating well.

## 2016-09-23 NOTE — Progress Notes (Signed)
   SLP Cancellation Note  Patient Details Name: Aniketh Huberty MRN: [id number] DOB: [birth year]   Cancelled treatment:       Reason Eval/Treat Not Completed: Other (comment) (pt at HD)   Luanna Salk, [facility] SLP [phone]

## 2016-09-23 NOTE — Progress Notes (Signed)
Respiratory care note- Pt placed on BIPAP for the night.  Tolerating well.

## 2016-09-24 ENCOUNTER — Inpatient Hospital Stay (HOSPITAL_COMMUNITY): Payer: Medicaid Other

## 2016-09-24 DIAGNOSIS — J9600 Acute respiratory failure, unspecified whether with hypoxia or hypercapnia: Secondary | ICD-10-CM

## 2016-09-24 DIAGNOSIS — N189 Chronic kidney disease, unspecified: Secondary | ICD-10-CM

## 2016-09-24 DIAGNOSIS — N179 Acute kidney failure, unspecified: Secondary | ICD-10-CM

## 2016-09-24 LAB — CBC WITH DIFFERENTIAL/PLATELET
Basophils Absolute: 0.1 10*3/uL (ref 0.0–0.1)
Basophils Relative: 1 %
EOS PCT: 4 %
Eosinophils Absolute: 0.5 10*3/uL (ref 0.0–0.7)
HEMATOCRIT: 34.9 % — AB (ref 39.0–52.0)
HEMOGLOBIN: 11.5 g/dL — AB (ref 13.0–17.0)
LYMPHS ABS: 2.6 10*3/uL (ref 0.7–4.0)
Lymphocytes Relative: 21 %
MCH: 25.3 pg — ABNORMAL LOW (ref 26.0–34.0)
MCHC: 33 g/dL (ref 30.0–36.0)
MCV: 76.9 fL — AB (ref 78.0–100.0)
MONOS PCT: 10 %
Monocytes Absolute: 1.2 10*3/uL — ABNORMAL HIGH (ref 0.1–1.0)
NEUTROS PCT: 64 %
Neutro Abs: 8 10*3/uL — ABNORMAL HIGH (ref 1.7–7.7)
Platelets: 253 10*3/uL (ref 150–400)
RBC: 4.54 MIL/uL (ref 4.22–5.81)
RDW: 17.1 % — ABNORMAL HIGH (ref 11.5–15.5)
WBC: 12.4 10*3/uL — AB (ref 4.0–10.5)

## 2016-09-24 LAB — RENAL FUNCTION PANEL
ALBUMIN: 3.1 g/dL — AB (ref 3.5–5.0)
ANION GAP: 26 — AB (ref 5–15)
BUN: 96 mg/dL — ABNORMAL HIGH (ref 6–20)
CO2: 17 mmol/L — ABNORMAL LOW (ref 22–32)
Calcium: 9.4 mg/dL (ref 8.9–10.3)
Chloride: 85 mmol/L — ABNORMAL LOW (ref 101–111)
Creatinine, Ser: 11.52 mg/dL — ABNORMAL HIGH (ref 0.61–1.24)
GFR, EST AFRICAN AMERICAN: 6 mL/min — AB (ref >60)
GFR, EST NON AFRICAN AMERICAN: 5 mL/min — AB (ref >60)
Glucose, Bld: 113 mg/dL — ABNORMAL HIGH (ref 65–99)
PHOSPHORUS: 10.9 mg/dL — AB (ref 2.5–4.6)
POTASSIUM: 3.6 mmol/L (ref 3.5–5.1)
Sodium: 128 mmol/L — ABNORMAL LOW (ref 135–145)

## 2016-09-24 LAB — GLUCOSE, CAPILLARY
GLUCOSE-CAPILLARY: 102 mg/dL — AB (ref 65–99)
GLUCOSE-CAPILLARY: 123 mg/dL — AB (ref 65–99)
GLUCOSE-CAPILLARY: 87 mg/dL (ref 65–99)
GLUCOSE-CAPILLARY: 89 mg/dL (ref 65–99)
GLUCOSE-CAPILLARY: 89 mg/dL (ref 65–99)
GLUCOSE-CAPILLARY: 91 mg/dL (ref 65–99)
Glucose-Capillary: 100 mg/dL — ABNORMAL HIGH (ref 65–99)

## 2016-09-24 LAB — MAGNESIUM: Magnesium: 2.8 mg/dL — ABNORMAL HIGH (ref 1.7–2.4)

## 2016-09-24 MED ORDER — DICLOFENAC SODIUM 1 % TD GEL
2.0000 g | Freq: Four times a day (QID) | TRANSDERMAL | Status: DC | PRN
  Administered 2016-09-24: 2 g via TOPICAL
  Filled 2016-09-24: qty 100

## 2016-09-24 MED ORDER — LIDOCAINE 5 % EX PTCH
1.0000 | MEDICATED_PATCH | CUTANEOUS | Status: DC
  Administered 2016-09-24 – 2016-09-29 (×4): 1 via TRANSDERMAL
  Filled 2016-09-24 (×5): qty 1

## 2016-09-24 MED ORDER — PANTOPRAZOLE SODIUM 40 MG IV SOLR
40.0000 mg | Freq: Once | INTRAVENOUS | Status: AC
  Administered 2016-09-24: 40 mg via INTRAVENOUS
  Filled 2016-09-24: qty 40

## 2016-09-24 NOTE — Progress Notes (Signed)
Admit: 09/11/2016 LOS: 71  33M with dialysis dependent AoCKD, sepsis, VDRF  Subjective:  HD yesterday, tolerated well. 1.3 L removed, used temp dialysis catheter Seen by the VS to evaluate for tunnel dialysis catheter and permanent access Blood pressure stable   05/04 0701 - 05/05 0700 In: 427.7 [I.V.:427.7] Out: 1346 [Stool:1]  Filed Weights   09/23/16 0815 09/23/16 1225 09/24/16 0500  Weight: (!) 150 kg (330 lb 11 oz) (!) 148.7 kg (327 lb 13.2 oz) (!) 150 kg (330 lb 11 oz)    Scheduled Meds: . amLODipine  10 mg Per Tube Daily  . chlorhexidine gluconate (MEDLINE KIT)  15 mL Mouth Rinse BID  . Chlorhexidine Gluconate Cloth  6 each Topical Daily  . darbepoetin (ARANESP) injection - DIALYSIS  100 mcg Intravenous Q Mon-HD  . heparin subcutaneous  5,000 Units Subcutaneous Q8H  . insulin aspart  1-3 Units Subcutaneous Q4H  . levalbuterol  1.25 mg Nebulization Q6H  . levothyroxine  75 mcg Intravenous Daily  . mouth rinse  15 mL Mouth Rinse QID  . sodium chloride flush  10-40 mL Intracatheter Q12H   Continuous Infusions: . sodium chloride 100 mL (09/22/16 2100)  . sodium chloride    . sodium chloride 250 mL (09/19/16 0843)  . dextrose     PRN Meds:.sodium chloride, sodium chloride, sodium chloride, acetaminophen, albuterol, alteplase, dextrose, docusate, heparin, heparin, heparin, HYDROmorphone (DILAUDID) injection, labetalol, lidocaine (PF), lidocaine-prilocaine, ondansetron (ZOFRAN) IV, pentafluoroprop-tetrafluoroeth  Current Labs: reviewed   Physical Exam:  Blood pressure 127/71, pulse (!) 107, temperature 98 F (36.7 C), temperature source Oral, resp. rate (!) 23, height 6' 2"  (1.88 m), weight (!) 150 kg (330 lb 11 oz), SpO2 100 %. Awake, alert, interactive Coarse bs/ b/l reghular, tachy 1+ LEE, pitting into shins R IJ temp HD cath  A 1. Anuric Dialysis depdendent AoCKD -- remains unclear if this how much acute vs chronic component, started dialysis on 4/22 2. CAP /  VAP, persistent fever, B Cx NGF, Vanc/Zosyn completed; improving 3. Sepsis, off pressors, resolved 4. VDRF; resolving 5. Anemia, on ESA; no iron as of now w/ recent infection 6. Pericardial effusion 7. Hyperphosphatemia: follow for now with HD, once PO established consider binders 8. Mild 2HPTH at admission; no need for VDRA or cinacalcet  P 1. Cont iHD MWF today, pull fluid as able:  2. Line holiday, remove temporary dialysis catheter today. Patient will need at least tunnel dialysis catheter on 5/7 as he will need dialysis that day. 3. There is a possibility of renal recovery, largely since time course of renal faiulre is unclear (is this dominantly acute or progressive CKD...).  Kidneys were normal sized at admit Renal US.  Would use contrasted studies only if no viable other options at this time.     Pearson Grippe MD 09/24/2016, 8:46 AM   Recent Labs Lab 09/21/16 0500 09/22/16 0500 09/23/16 0500  NA 137 135 132*  K 4.0 3.6 3.4*  CL 91* 90* 87*  CO2 21* 24 21*  GLUCOSE 124* 118* 99  BUN 99* 64* 99*  CREATININE 10.45* 8.00* 11.04*  CALCIUM 9.4 9.8 9.4  PHOS 7.4* 7.7* 9.9*    Recent Labs Lab 09/21/16 0500 09/22/16 0500 09/23/16 0500  WBC 19.0* 17.7* 15.3*  NEUTROABS 13.3* 11.4* 9.6*  HGB 10.1* 10.3* 10.0*  HCT 30.9* 32.0* 30.4*  MCV 77.6* 78.4 77.6*  PLT 308 267 248

## 2016-09-24 NOTE — Progress Notes (Signed)
Report given to [employer]

## 2016-09-24 NOTE — Progress Notes (Signed)
PROGRESS NOTE    Paul [name]  CVE:[id number] DOB: 07-17-1990 DOA: 09/11/2016 PCP: Janora Norlander, DO   Brief Narrative:  26 year old male with past medical history of diabetes, hypertension, hyperlipidemia, renal insufficiency and morbid obesity presented to the ER with complaints of confusion respiratory distress. He was noted to be hypoxic and tachypnea to the ER therefore placed on BiPAP. About a week prior to admission he was placed on Z-Pak by his PCP without any improvement. In the ER he was started on vancomycin and Zosyn. He was noted to have significantly elevated creatinine and was almost and uric therefore emergent dialysis was performed. He was also intubated due to no improvement for BiPAP. His cultures*far remain negative and he has been treated with antibiotics and completed the course of vancomycin and Zosyn on 09/22/2016. During his time in ICU actually self extubated but had to be reintubated on April 26 due to mucous plugging in the right main bronchus. While in the ICU he received CRRT later which was transitioned to intermittent dialysis on 09/16/2016. Initial echocardiogram on April 22 showed ejection fraction of 60-65 percent with moderate sized pericardial effusion but with dialysis it has improved and repeat echocardiogram shows small effusion compared to the one from the time of admission. Renal ultrasound shows medical disease.   Assessment & Plan:   Active Problems:   Acute hypoxemic respiratory failure (HCC)   Pressure injury of skin   Acute renal failure (ARF) (HCC)   Metabolic acidosis   Acute pulmonary edema (HCC)   Respiratory distress   HAP (hospital-acquired pneumonia)   VAP (ventilator-associated pneumonia) (HCC)   AKI (acute kidney injury) ([place])  Acute hypoxic/hypercapnic respiratory failure -Likely multifactorial in nature, pneumonia, pulmonary edema, untreated obstructive sleep apnea and mucus plugging -Was on nocturnal BiPAP but didn't use much  last night. He is doing much better this morning therefore I'll switch him to nocturnal CPAP. -Supplemental O2 as needed -Completed his course of antibiotics  Pulmonary edema/moderate sized pericardial effusion -Repeat echocardiogram yesterday shows improvement in smaller pericardial effusion -Suspect this could be from uremia. 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 History of anybody is mildly positive -Vein mapping to be done this weekend ( will go ahead and order it) so he can have permanent access/catheter placement early next week. Can remove triple-lumen catheter.  Anemia of chronic diseases Sickle cell trait -Continue monitor hemoglobin. No active signs of infection at this time  Elevated Phos, now on HD Mild Secondary HyperPTH - defer the need for Cinacalcet to nephro   HTN -cont Norvasc 43m po daily and labetalol prn   DVT prophylaxis: hep subQ Code Status: Full  Family Communication:  None at bedside  Disposition Plan: TBD  Consultants:   nephro   Procedures:   FEES 5/3  Bronchoscopy 4/26  CRRT 4/24  Antimicrobials:  4/22 Vanc/Zosyn x 1 4/22 Rocephin >> 4/23 4/22 Levaquin >> 4/25 4/27 Vanc/Zosyn >> 5/3    Subjective: No acute events overnight. He states he did well and his only complaints this morning. Only used BiPAP last night for short period time.  Objective: Vitals:   09/24/16 0944 09/24/16 1000 09/24/16 1100 09/24/16 1205  BP:    120/80  Pulse:  (!) 111 (!) 118 (!) 109  Resp:  (!) 27 (!) 28 15  Temp:    97.6 F (36.4 C)  TempSrc:    Oral  SpO2: 91% 92% 97% 100%  Weight:      Height:        Intake/Output Summary (Last 24 hours) at 09/24/16 1236 Last data filed at 09/24/16 1200  Gross per 24 hour  Intake           679.33 ml  Output                1 ml  Net           678.33 ml   Filed  Weights   09/23/16 0815 09/23/16 1225 09/24/16 0500  Weight: (!) 150 kg (330 lb 11 oz) (!) 148.7 kg (327 lb 13.2 oz) (!) 150 kg (330 lb 11 oz)    Examination:  General exam: Appears calm and comfortable. obese Respiratory system: Clear to auscultation. Respiratory effort normal. Cardiovascular system: S1 & S2 heard, RRR. No JVD, murmurs, rubs, gallops or clicks. No pedal edema. R IJ HD catheter in place  Gastrointestinal system: Abdomen is nondistended, soft and nontender. No organomegaly or masses felt. Normal bowel sounds heard. Central nervous system: Alert. Follows some commands  Extremities: Symmetric 5 x 5 power. Skin: No rashes, lesions or ulcers Psychiatry: Judgement and insight appear normal. Mood & affect appropriate.     Data Reviewed:   CBC:  Recent Labs Lab 09/19/16 0411 09/20/16 0430 09/21/16 0500 09/22/16 0500 09/23/16 0500  WBC 18.3* 18.3* 19.0* 17.7* 15.3*  NEUTROABS 13.8* 12.9* 13.3* 11.4* 9.6*  HGB 9.5* 10.2* 10.1* 10.3* 10.0*  HCT 29.3* 31.3* 30.9* 32.0* 30.4*  MCV 77.5* 78.3 77.6* 78.4 77.6*  PLT 290 284 308 267 161   Basic Metabolic Panel:  Recent Labs Lab 09/19/16 0411 09/19/16 0413 09/20/16 0430 09/21/16 0500 09/22/16 0500 09/23/16 0500  NA  --  137 138 137 135 132*  K  --  3.9 4.1 4.0 3.6 3.4*  CL  --  91* 94* 91* 90* 87*  CO2  --  23 24 21* 24 21*  GLUCOSE  --  141* 135* 124* 118* 99  BUN  --  80* 62* 99* 64* 99*  CREATININE  --  8.32* 7.29* 10.45* 8.00* 11.04*  CALCIUM  --  7.7* 8.8* 9.4 9.8 9.4  MG 2.7*  --  3.0* 3.0* 2.6* 2.9*  PHOS  --  8.8* 6.5* 7.4* 7.7* 9.9*   GFR: Estimated Creatinine Clearance: 16 mL/min (A) (by C-G formula based on SCr of 11.04 mg/dL (H)). Liver Function Tests:  Recent Labs Lab 09/19/16 0413 09/20/16 0430 09/21/16 0500 09/22/16 0500 09/23/16 0500  ALBUMIN 2.6* 2.8* 2.8* 2.9* 2.7*   No results for input(s): LIPASE, AMYLASE in the last 168 hours. No results for input(s): AMMONIA in the last 168  hours. Coagulation Profile: No results for input(s): INR, PROTIME in the last 168 hours. Cardiac Enzymes:  Recent Labs Lab 09/19/16 1313 09/19/16 1859 09/20/16 0030 09/20/16 0615  TROPONINI <0.03 0.03* <0.03 0.12*   BNP (last 3 results) No results for input(s): PROBNP in the last 8760 hours. HbA1C: No results for input(s): HGBA1C in the last 72 hours. CBG:  Recent Labs Lab 09/23/16 2009 09/24/16 0012 09/24/16 0535 09/24/16 0752 09/24/16 1204  GLUCAP 98 102* 100* 89 123*   Lipid Profile: No results for input(s): CHOL, HDL, LDLCALC, TRIG, CHOLHDL, LDLDIRECT in the last 72 hours. Thyroid Function Tests: No results for input(s): TSH, T4TOTAL, FREET4, T3FREE, THYROIDAB in the last 72 hours. Anemia Panel: No results for input(s): VITAMINB12, FOLATE, FERRITIN, TIBC, IRON, RETICCTPCT in the last 72  hours. Sepsis Labs:  Recent Labs Lab 09/18/16 0434  PROCALCITON 5.30    Recent Results (from the past 240 hour(s))  Culture, respiratory (NON-Expectorated)     Status: None   Collection Time: 09/15/16  4:00 PM  Result Value Ref Range Status   Specimen Description BRONCHIAL ALVEOLAR LAVAGE  Final   Special Requests NONE  Final   Gram Stain   Final    ABUNDANT WBC PRESENT,BOTH PMN AND MONONUCLEAR MODERATE GRAM NEGATIVE RODS FEW GRAM NEGATIVE COCCI IN PAIRS MODERATE GRAM POSITIVE COCCI IN PAIRS FEW GRAM POSITIVE RODS RARE YEAST ABUNDANT SQUAMOUS EPITHELIAL CELLS PRESENT    Culture Consistent with normal respiratory flora.  Final   Report Status 09/18/2016 FINAL  Final  Culture, blood (Routine X 2) w Reflex to ID Panel     Status: None   Collection Time: 09/16/16  7:58 AM  Result Value Ref Range Status   Specimen Description BLOOD RIGHT HAND  Final   Special Requests   Final    BOTTLES DRAWN AEROBIC AND ANAEROBIC Blood Culture adequate volume   Culture NO GROWTH 5 DAYS  Final   Report Status 09/21/2016 FINAL  Final  Culture, blood (Routine X 2) w Reflex to ID Panel      Status: None   Collection Time: 09/16/16  8:04 AM  Result Value Ref Range Status   Specimen Description BLOOD LEFT HAND  Final   Special Requests   Final    BOTTLES DRAWN AEROBIC AND ANAEROBIC Blood Culture adequate volume   Culture NO GROWTH 5 DAYS  Final   Report Status 09/21/2016 FINAL  Final         Radiology Studies: No results found.      Scheduled Meds: . amLODipine  10 mg Per Tube Daily  . chlorhexidine gluconate (MEDLINE KIT)  15 mL Mouth Rinse BID  . Chlorhexidine Gluconate Cloth  6 each Topical Daily  . darbepoetin (ARANESP) injection - DIALYSIS  100 mcg Intravenous Q Mon-HD  . heparin subcutaneous  5,000 Units Subcutaneous Q8H  . insulin aspart  1-3 Units Subcutaneous Q4H  . levalbuterol  1.25 mg Nebulization Q6H  . levothyroxine  75 mcg Intravenous Daily  . mouth rinse  15 mL Mouth Rinse QID  . sodium chloride flush  10-40 mL Intracatheter Q12H   Continuous Infusions: . sodium chloride 100 mL (09/24/16 0700)  . sodium chloride    . sodium chloride 250 mL (09/19/16 0843)  . dextrose       LOS: 13 days    Time spent: 35 mins     Ankit Arsenio Loader, MD [facility] Pager [phone]   If 7PM-7AM, please contact night-coverage [email or web address] Password [facility] 09/24/2016, 12:36 PM

## 2016-09-24 NOTE — Progress Notes (Signed)
PT Cancellation Note  Patient Details Name: Paul [name] MRN: [id number] DOB: January 24, 1991   Cancelled Treatment:    Reason Eval/Treat Not Completed: Patient at procedure or test/unavailable. Pt having a procedure done in the room. PT will continue to f/u with pt as available.   [place] 09/24/2016, 1:14 PM

## 2016-09-24 NOTE — Progress Notes (Signed)
  Speech Language Pathology Treatment: Dysphagia  Patient Details Name: Paul [name] MRN: [id number] DOB: [birth year] Today's Date: 09/24/2016 Time: 0902-0920 SLP Time Calculation (min) (ACUTE ONLY): 18 min  Assessment / Plan / Recommendation Clinical Impression  Patient presents with slight improvement in vocal quality; with reduced hoarseness but continues to have low vocal amplitude, mildly weak cough. Provided education re: instrumental tests to evaluate swallowing function given silent aspiration of multiple consistencies seen on FEES 5/3. Recommend repeat instrumental prior to diet initiation. FEES not available on the weekend, however given pt's size, ability to perform MBS is limited. Oral care performed prior to initiation of PO trials, training in compensatory cough/throat clear to reduce risk of aspiration. Pt implements these techniques with initial instruction for sips of thin liquids, ice chips, and teaspoons pureed solid. Given improvements in cough, vocal quality, pt may have medications crushed in puree. Continue sips and chips of water/ice only within 30 minutes after oral care. SLP will f/u next date for improvements at bedside, possibility of performing MBS in larger suite. Anticipate rapid improvements and diet initiation by Monday (repeat FEES if MBS not possible).     HPI HPI: 12M with dialysis dependent AoCKD, sepsis, VDRF. Admitted to ER via EMS with confusion and respiratory distress, CAP. Intubated 4/22, self extubated 4/26 but reintubated and extubated 5/2.       SLP Plan  Other (Comment) (Repeat FEES Monday)       Recommendations  Diet recommendations: Other(comment) (Water, ice chips within 30 minutes after oral care) Liquids provided via: Cup Medication Administration: Crushed with puree Supervision: Patient able to self feed;Intermittent supervision to cue for compensatory strategies (Must provide oral care prior to sips of water, ice chips) Compensations: Slow  rate;Small sips/bites Postural Changes and/or Swallow Maneuvers: Seated upright 90 degrees                Oral Care Recommendations: Oral care QID;Oral care prior to ice chip/H20 SLP Visit Diagnosis: Dysphagia, pharyngeal phase (R13.13) Plan: Other (Comment) (Repeat FEES Monday)       [place], [place], CCC-SLP Speech-Language Pathologist [phone]  Aliene Altes 09/24/2016, 9:24 AM

## 2016-09-24 NOTE — Progress Notes (Signed)
R TL HD cath removed per order. Site cleaned with CHG, sutures removed, site WNL without signs of infection noted.  Pressure applied with vaseline guaze and dry 4x4 dressing.   No AND, remove dressing in 24-48 hours.

## 2016-09-24 NOTE — Progress Notes (Signed)
  Right  Upper Extremity Vein Map    Cephalic  Segment Diameter Depth Comment  1. Axilla mm mm   2. Mid upper arm 2.60mm 3.69mm   3. Above AC 2.49mm 3.70mm   4. In AC 2.68mm 78mm   5. Below AC 31mm 2.77mm   6. Mid forearm 1.4mm 6.60mm Multiple branches  7. Wrist 1.40mm 2.52mm    mm mm    mm mm    mm mm    Basilic  Segment Diameter Depth Comment  1. Axilla mm mm   2. Mid upper arm 6.60mm 44mm   3. Above AC 3.58mm 78mm   4. In AC 3.60mm 11mm   5. Below AC mm mm bandages  6. Mid forearm mm mm small  7. Wrist mm mm Too small   mm mm    mm mm    mm mm    Left Upper Extremity Vein Map    Cephalic  Segment Diameter Depth Comment  1. Axilla 4.21mm 56mm   2. Mid upper arm 5.26mm 66mm   3. Above AC 4.80mm 9.66mm   4. In [facility] 5.42mm 7.34mm   5. Below AC 2.36mm 8.30mm   6. Mid forearm 3.46mm 8.58mm branch  7. Wrist mm 3.107mm Multiple branches   mm mm    mm mm    mm mm    Basilic  Segment Diameter Depth Comment  1. Axilla mm mm   2. Mid upper arm 4.68mm 11mm   3. Above AC  52mm 70mm branch  4. In [facility]  3.50mm 58mm   5. Below AC  3.49mm 7.21mm   6. Mid forearm  3.57mm 3.69mm branch  7. Wrist  1.5mm 2.74mm branch    mm mm    mm mm    mm mm

## 2016-09-25 LAB — GLUCOSE, CAPILLARY
GLUCOSE-CAPILLARY: 89 mg/dL (ref 65–99)
GLUCOSE-CAPILLARY: 92 mg/dL (ref 65–99)
GLUCOSE-CAPILLARY: 91 mg/dL (ref 65–99)
GLUCOSE-CAPILLARY: 110 mg/dL — AB (ref 65–99)
GLUCOSE-CAPILLARY: 92 mg/dL (ref 65–99)

## 2016-09-25 LAB — CBC WITH DIFFERENTIAL/PLATELET
BASOS ABS: 0 10*3/uL (ref 0.0–0.1)
BASOS PCT: 0 %
EOS ABS: 0.2 10*3/uL (ref 0.0–0.7)
Eosinophils Relative: 2 %
HCT: 29.8 % — ABNORMAL LOW (ref 39.0–52.0)
HEMOGLOBIN: 10.1 g/dL — AB (ref 13.0–17.0)
LYMPHS ABS: 2.6 10*3/uL (ref 0.7–4.0)
LYMPHS PCT: 23 %
MCH: 25.6 pg — AB (ref 26.0–34.0)
MCHC: 33.9 g/dL (ref 30.0–36.0)
MCV: 75.6 fL — AB (ref 78.0–100.0)
Monocytes Absolute: 1.1 10*3/uL — ABNORMAL HIGH (ref 0.1–1.0)
Monocytes Relative: 10 %
NEUTROS ABS: 7.5 10*3/uL (ref 1.7–7.7)
NEUTROS PCT: 65 %
Platelets: 231 10*3/uL (ref 150–400)
RBC: 3.94 MIL/uL — ABNORMAL LOW (ref 4.22–5.81)
RDW: 17.7 % — ABNORMAL HIGH (ref 11.5–15.5)
WBC: 11.4 10*3/uL — AB (ref 4.0–10.5)

## 2016-09-25 LAB — RENAL FUNCTION PANEL
Albumin: 2.9 g/dL — ABNORMAL LOW (ref 3.5–5.0)
Anion gap: 28 — ABNORMAL HIGH (ref 5–15)
BUN: 115 mg/dL — ABNORMAL HIGH (ref 6–20)
CHLORIDE: 85 mmol/L — AB (ref 101–111)
CO2: 15 mmol/L — AB (ref 22–32)
Calcium: 8.6 mg/dL — ABNORMAL LOW (ref 8.9–10.3)
Creatinine, Ser: 13.21 mg/dL — ABNORMAL HIGH (ref 0.61–1.24)
GFR, EST AFRICAN AMERICAN: 5 mL/min — AB (ref >60)
GFR, EST NON AFRICAN AMERICAN: 5 mL/min — AB (ref >60)
Glucose, Bld: 82 mg/dL (ref 65–99)
Phosphorus: 11.4 mg/dL — ABNORMAL HIGH (ref 2.5–4.6)
Potassium: 4.2 mmol/L (ref 3.5–5.1)
Sodium: 128 mmol/L — ABNORMAL LOW (ref 135–145)

## 2016-09-25 LAB — MAGNESIUM: MAGNESIUM: 2.8 mg/dL — AB (ref 1.7–2.4)

## 2016-09-25 MED ORDER — DEXTROSE 5 % IV SOLN
1.5000 g | INTRAVENOUS | Status: AC
  Administered 2016-09-26: 1.5 g via INTRAVENOUS
  Filled 2016-09-25: qty 1.5

## 2016-09-25 MED ORDER — LORAZEPAM 2 MG/ML IJ SOLN
1.0000 mg | Freq: Once | INTRAMUSCULAR | Status: AC
  Administered 2016-09-25: 1 mg via INTRAVENOUS
  Filled 2016-09-25: qty 1

## 2016-09-25 MED ORDER — LEVALBUTEROL HCL 1.25 MG/0.5ML IN NEBU
1.2500 mg | INHALATION_SOLUTION | Freq: Two times a day (BID) | RESPIRATORY_TRACT | Status: DC
  Administered 2016-09-25 – 2016-09-28 (×6): 1.25 mg via RESPIRATORY_TRACT
  Filled 2016-09-25 (×9): qty 0.5

## 2016-09-25 MED ORDER — MORPHINE SULFATE (PF) 4 MG/ML IV SOLN: 4.0000 mg | Freq: Once | INTRAVENOUS | Status: DC

## 2016-09-25 NOTE — Progress Notes (Signed)
Patient requested CPAP be removed related to discomfort. This RN removed the CPAP. The patient wore CPAP for approximately 2.5 hrs. Respiratory notified.

## 2016-09-25 NOTE — Progress Notes (Signed)
Central monitoring informed this RN that the patient had 9 beat run of V. Tach. Patient is having no chest pain or shortness of breath. Patient is resting well on CPAP. Maudie Mercury, MD notified. Will continue to monitor.

## 2016-09-25 NOTE — Progress Notes (Signed)
Admit: 09/11/2016 LOS: 66  83M with dialysis dependent AoCKD, sepsis, VDRF  Subjective:  Vein Mapping yesterday Temp HD cath removed Afebrile, WBC cont to improve No UOP recorded Didn't pass swallow eval He tells me pCP Jinny Blossom) warned him he was at risk for dialysis down the road at recent office visit and that lisinopril and metformin were stopped  05/05 0701 - 05/06 0700 In: 290 [P.O.:150; I.V.:140] Out: -   Filed Weights   09/23/16 1225 09/24/16 0500 09/25/16 0434  Weight: (!) 148.7 kg (327 lb 13.2 oz) (!) 150 kg (330 lb 11 oz) (!) 151 kg (332 lb 14.3 oz)    Scheduled Meds: . amLODipine  10 mg Per Tube Daily  . darbepoetin (ARANESP) injection - DIALYSIS  100 mcg Intravenous Q Mon-HD  . heparin subcutaneous  5,000 Units Subcutaneous Q8H  . insulin aspart  1-3 Units Subcutaneous Q4H  . levalbuterol  1.25 mg Nebulization BID  . levothyroxine  75 mcg Intravenous Daily  . lidocaine  1 patch Transdermal Q24H  . sodium chloride flush  10-40 mL Intracatheter Q12H   Continuous Infusions: . sodium chloride 100 mL (09/24/16 0700)  . sodium chloride    . sodium chloride 250 mL (09/19/16 0843)  . dextrose     PRN Meds:.sodium chloride, sodium chloride, sodium chloride, acetaminophen, albuterol, alteplase, dextrose, diclofenac sodium, docusate, heparin, heparin, heparin, HYDROmorphone (DILAUDID) injection, labetalol, lidocaine (PF), lidocaine-prilocaine, ondansetron (ZOFRAN) IV, pentafluoroprop-tetrafluoroeth  Current Labs: reviewed   Physical Exam:  Blood pressure 110/66, pulse (!) 109, temperature 97.8 F (36.6 C), temperature source Oral, resp. rate (!) 33, height 6\' 2"  (1.88 m), weight (!) 151 kg (332 lb 14.3 oz), SpO2 95 %. Awake, alert, interactive Coarse bs/ b/l reghular, tachy No LEE s/nt/nd Bandage over prev R IJ Temp Cath  A 1. Anuric Dialysis depdendent AoCKD -- remains unclear if this how much acute vs chronic component -- I increasingly favor this has  progressed to ESRD, started dialysis on 4/22.   2. CAP / VAP, persistent fever, B Cx NGF, Vanc/Zosyn completed; improving 3. Sepsis, off pressors, resolved 4. VDRF; resolving 5. Anemia, on ESA; no iron as of now w/ recent infection 6. Pericardial effusion 7. Hyperphosphatemia: follow for now with HD, once PO established consider binders 8. Mild 2HPTH at admission; no need for VDRA or cinacalcet  P 1. Cont iHD MWF today, pull fluid as able: 2K, 2.5 Ca, 4h, No heparin; CLIP in process 2. On line holiday currnetly, he will need vascular access tomorrow 5/7 for HD, VVS aware and perhaps can have AV access and TDC simultaneous 3. Would use contrasted studies only if no viable other options at this time.     Pearson Grippe MD 09/25/2016, 7:51 AM   Recent Labs Lab 09/23/16 0500 09/24/16 1214 09/25/16 0306  NA 132* 128* 128*  K 3.4* 3.6 4.2  CL 87* 85* 85*  CO2 21* 17* 15*  GLUCOSE 99 113* 82  BUN 99* 96* 115*  CREATININE 11.04* 11.52* 13.21*  CALCIUM 9.4 9.4 8.6*  PHOS 9.9* 10.9* 11.4*    Recent Labs Lab 09/23/16 0500 09/24/16 1214 09/25/16 0306  WBC 15.3* 12.4* 11.4*  NEUTROABS 9.6* 8.0* 7.5  HGB 10.0* 11.5* 10.1*  HCT 30.4* 34.9* 29.8*  MCV 77.6* 76.9* 75.6*  PLT 248 253 231

## 2016-09-25 NOTE — Progress Notes (Addendum)
  [facility] Progress Note  Vein mapping reviewed. Patient right handed. He has adequate conduit for left radial versus brachial cephalic fistula. Plan for left arm fistula and TDC insertion tomorrow. Procedure discussed with patient and he is willing to proceed. NPO past midnight. Obtain consent.   Virgina Jock, PA-C [facility] Office: [phone] Pager: [phone] 09/25/2016 8:47 AM   Agree with above.  Vein map reviewed.  Upper arm cephalic is going to be best choice will need to superficialize later when mature.  Catheter tomorrow as well   NPO p midnight Consent  Ruta Hinds, MD [facility] [place] Office: [phone] Pager: [phone]

## 2016-09-25 NOTE — Progress Notes (Signed)
Patient stated that last night 5/5 he did not tolerate the FFM, tonight he stated he would like to try CPAP again but with a small mask. Patient attempted nasal mask, but did not tolerate. He said he just did not like the pressure of a CPAP and would rather try using a nasal cannula. 2 LPM nasal cannula was placed on patient. RT made pt and family at bedside aware that if he would like to try again to call.

## 2016-09-25 NOTE — Progress Notes (Signed)
PROGRESS NOTE    Paul [name]  CZY:[id number] DOB: 09-13-1990 DOA: 09/11/2016 PCP: Janora Norlander, DO   Brief Narrative:  26 year old male with past medical history of diabetes, hypertension, hyperlipidemia, renal insufficiency and morbid obesity presented to the ER with complaints of confusion respiratory distress. He was noted to be hypoxic and tachypnea to the ER therefore placed on BiPAP. About a week prior to admission he was placed on Z-Pak by his PCP without any improvement. In the ER he was started on vancomycin and Zosyn. He was noted to have significantly elevated creatinine and was almost and uric therefore emergent dialysis was performed. He was also intubated due to no improvement for BiPAP. His cultures*far remain negative and he has been treated with antibiotics and completed the course of vancomycin and Zosyn on 09/22/2016. During his time in ICU actually self extubated but had to be reintubated on April 26 due to mucous plugging in the right main bronchus. While in the ICU he received CRRT later which was transitioned to intermittent dialysis on 09/16/2016. Initial echocardiogram on April 22 showed ejection fraction of 60-65 percent with moderate sized pericardial effusion but with dialysis it has improved and repeat echocardiogram shows small effusion compared to the one from the time of admission. Renal ultrasound shows medical disease.   Assessment & Plan:   Active Problems:   Acute hypoxemic respiratory failure (HCC)   Pressure injury of skin   Acute renal failure (ARF) (HCC)   Metabolic acidosis   Acute pulmonary edema (HCC)   Respiratory distress   HAP (hospital-acquired pneumonia)   VAP (ventilator-associated pneumonia) (HCC)   AKI (acute kidney injury) ([place])  Acute hypoxic/hypercapnic respiratory failure -Likely multifactorial in nature, pneumonia, pulmonary edema, untreated obstructive sleep apnea and mucus plugging -on CPAP qhs now, states he is uncomfortably  with the face mask, I have asked RT to try nasal mask tonight.  -Supplemental O2 as needed -Completed his course of antibiotics  Pulmonary edema/moderate sized pericardial effusion -Repeat echocardiogram 5/3  shows improvement in smaller pericardial effusion -Suspect this could be from uremia. 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 History of anybody is mildly positive -Vein mapping done- vascular following. Plans for Tunneled catheter AVF tomorrow. Needs to remove his TLC meanwhile.   Anemia of chronic diseases Sickle cell trait -Continue monitor hemoglobin. No active signs of infection at this time  Elevated Phos, now on HD Mild Secondary HyperPTH - defer the need for Cinacalcet to nephro   HTN -cont Norvasc 10mg  po daily and labetalol prn   High risk of aspiration per speech. Will cont IVF, at this time doenst want Cortrak, it will become necessary if he doesn't pass his swallow test.   DVT prophylaxis: hep subQ Code Status: Full  Family Communication:  None at bedside  Disposition Plan: TBD  Consultants:   nephro   Procedures:   FEES 5/3  Bronchoscopy 4/26  CRRT 4/24  Antimicrobials:  4/22 Vanc/Zosyn x 1 4/22 Rocephin >> 4/23 4/22 Levaquin >> 4/25 4/27 Vanc/Zosyn >> 5/3    Subjective: No acute events overnight. States CPAP mask is uncomfortable otherwise no other complaints.   Objective: Vitals:   09/24/16 2355 09/25/16 0434 09/25/16 0740 09/25/16 1031  BP:  110/66    Pulse:  (!) 109    Resp:  (!) 33    Temp:  98 F (36.7 C) 97.8 F (36.6 C)  TempSrc:  Oral Oral   SpO2: 93% 95%  94%  Weight:  (!) 151 kg (332 lb 14.3 oz)    Height:        Intake/Output Summary (Last 24 hours) at 09/25/16 1056 Last data filed at 09/25/16 0920  Gross per 24 hour  Intake              180 ml  Output                0 ml    Net              180 ml   Filed Weights   09/23/16 1225 09/24/16 0500 09/25/16 0434  Weight: (!) 148.7 kg (327 lb 13.2 oz) (!) 150 kg (330 lb 11 oz) (!) 151 kg (332 lb 14.3 oz)    Examination:  General exam: Appears calm and comfortable. obese Respiratory system: Clear to auscultation. Respiratory effort normal. Cardiovascular system: S1 & S2 heard, RRR. No JVD, murmurs, rubs, gallops or clicks. No pedal edema. R IJ HD catheter in place  Gastrointestinal system: Abdomen is nondistended, soft and nontender. No organomegaly or masses felt. Normal bowel sounds heard. Central nervous system: Alert. Follows some commands  Extremities: Symmetric 5 x 5 power. Skin: No rashes, lesions or ulcers Psychiatry: Judgement and insight appear normal. Mood & affect appropriate.     Data Reviewed:   CBC:  Recent Labs Lab 09/21/16 0500 09/22/16 0500 09/23/16 0500 09/24/16 1214 09/25/16 0306  WBC 19.0* 17.7* 15.3* 12.4* 11.4*  NEUTROABS 13.3* 11.4* 9.6* 8.0* 7.5  HGB 10.1* 10.3* 10.0* 11.5* 10.1*  HCT 30.9* 32.0* 30.4* 34.9* 29.8*  MCV 77.6* 78.4 77.6* 76.9* 75.6*  PLT 308 267 248 253 299   Basic Metabolic Panel:  Recent Labs Lab 09/21/16 0500 09/22/16 0500 09/23/16 0500 09/24/16 1214 09/25/16 0306  NA 137 135 132* 128* 128*  K 4.0 3.6 3.4* 3.6 4.2  CL 91* 90* 87* 85* 85*  CO2 21* 24 21* 17* 15*  GLUCOSE 124* 118* 99 113* 82  BUN 99* 64* 99* 96* 115*  CREATININE 10.45* 8.00* 11.04* 11.52* 13.21*  CALCIUM 9.4 9.8 9.4 9.4 8.6*  MG 3.0* 2.6* 2.9* 2.8* 2.8*  PHOS 7.4* 7.7* 9.9* 10.9* 11.4*   GFR: Estimated Creatinine Clearance: 13.4 mL/min (A) (by C-G formula based on SCr of 13.21 mg/dL (H)). Liver Function Tests:  Recent Labs Lab 09/21/16 0500 09/22/16 0500 09/23/16 0500 09/24/16 1214 09/25/16 0306  ALBUMIN 2.8* 2.9* 2.7* 3.1* 2.9*   No results for input(s): LIPASE, AMYLASE in the last 168 hours. No results for input(s): AMMONIA in the last 168 hours. Coagulation  Profile: No results for input(s): INR, PROTIME in the last 168 hours. Cardiac Enzymes:  Recent Labs Lab 09/19/16 1313 09/19/16 1859 09/20/16 0030 09/20/16 0615  TROPONINI <0.03 0.03* <0.03 0.12*   BNP (last 3 results) No results for input(s): PROBNP in the last 8760 hours. HbA1C: No results for input(s): HGBA1C in the last 72 hours. CBG:  Recent Labs Lab 09/24/16 1548 09/24/16 2019 09/24/16 2332 09/25/16 0436 09/25/16 0802  GLUCAP 87 89 91 89 92   Lipid Profile: No results for input(s): CHOL, HDL, LDLCALC, TRIG, CHOLHDL, LDLDIRECT in the last 72 hours. Thyroid Function Tests: No results for input(s): TSH, T4TOTAL, FREET4, T3FREE, THYROIDAB in the last 72 hours. Anemia Panel: No results for input(s): VITAMINB12, FOLATE, FERRITIN, TIBC, IRON, RETICCTPCT in the last 72 hours. Sepsis Labs: No results for input(s): PROCALCITON, LATICACIDVEN in the last 168  hours.  Recent Results (from the past 240 hour(s))  Culture, respiratory (NON-Expectorated)     Status: None   Collection Time: 09/15/16  4:00 PM  Result Value Ref Range Status   Specimen Description BRONCHIAL ALVEOLAR LAVAGE  Final   Special Requests NONE  Final   Gram Stain   Final    ABUNDANT WBC PRESENT,BOTH PMN AND MONONUCLEAR MODERATE GRAM NEGATIVE RODS FEW GRAM NEGATIVE COCCI IN PAIRS MODERATE GRAM POSITIVE COCCI IN PAIRS FEW GRAM POSITIVE RODS RARE YEAST ABUNDANT SQUAMOUS EPITHELIAL CELLS PRESENT    Culture Consistent with normal respiratory flora.  Final   Report Status 09/18/2016 FINAL  Final  Culture, blood (Routine X 2) w Reflex to ID Panel     Status: None   Collection Time: 09/16/16  7:58 AM  Result Value Ref Range Status   Specimen Description BLOOD RIGHT HAND  Final   Special Requests   Final    BOTTLES DRAWN AEROBIC AND ANAEROBIC Blood Culture adequate volume   Culture NO GROWTH 5 DAYS  Final   Report Status 09/21/2016 FINAL  Final  Culture, blood (Routine X 2) w Reflex to ID Panel      Status: None   Collection Time: 09/16/16  8:04 AM  Result Value Ref Range Status   Specimen Description BLOOD LEFT HAND  Final   Special Requests   Final    BOTTLES DRAWN AEROBIC AND ANAEROBIC Blood Culture adequate volume   Culture NO GROWTH 5 DAYS  Final   Report Status 09/21/2016 FINAL  Final         Radiology Studies: No results found.      Scheduled Meds: . amLODipine  10 mg Per Tube Daily  . darbepoetin (ARANESP) injection - DIALYSIS  100 mcg Intravenous Q Mon-HD  . heparin subcutaneous  5,000 Units Subcutaneous Q8H  . insulin aspart  1-3 Units Subcutaneous Q4H  . levalbuterol  1.25 mg Nebulization BID  . levothyroxine  75 mcg Intravenous Daily  . lidocaine  1 patch Transdermal Q24H  . sodium chloride flush  10-40 mL Intracatheter Q12H   Continuous Infusions: . sodium chloride 100 mL (09/24/16 0700)  . sodium chloride    . sodium chloride 250 mL (09/19/16 0843)  . [START ON 09/26/2016] cefUROXime (ZINACEF)  IV    . dextrose       LOS: 14 days    Time spent: 35 mins     Breanda Greenlaw Arsenio Loader, MD [facility] Pager [phone]   If 7PM-7AM, please contact night-coverage [email or web address] Password [email or web address] 09/25/2016, 10:56 AM

## 2016-09-26 ENCOUNTER — Inpatient Hospital Stay (HOSPITAL_COMMUNITY): Payer: Medicaid Other | Admitting: Certified Registered Nurse Anesthetist

## 2016-09-26 ENCOUNTER — Inpatient Hospital Stay (HOSPITAL_COMMUNITY): Payer: Medicaid Other

## 2016-09-26 ENCOUNTER — Encounter (HOSPITAL_COMMUNITY)
Admission: EM | Disposition: A | Discharge status: Home / Self Care | Payer: Self-pay | Source: Home / Self Care | Attending: Internal Medicine

## 2016-09-26 DIAGNOSIS — N185 Chronic kidney disease, stage 5: Secondary | ICD-10-CM

## 2016-09-26 HISTORY — PX: AV FISTULA PLACEMENT: SHX1204

## 2016-09-26 HISTORY — PX: INSERTION OF DIALYSIS CATHETER: SHX1324

## 2016-09-26 LAB — RENAL FUNCTION PANEL
ALBUMIN: 2.9 g/dL — AB (ref 3.5–5.0)
Anion gap: 30 — ABNORMAL HIGH (ref 5–15)
BUN: 139 mg/dL — AB (ref 6–20)
CALCIUM: 8.4 mg/dL — AB (ref 8.9–10.3)
CO2: 13 mmol/L — AB (ref 22–32)
Chloride: 86 mmol/L — ABNORMAL LOW (ref 101–111)
Creatinine, Ser: 15.88 mg/dL — ABNORMAL HIGH (ref 0.61–1.24)
GFR calc Af Amer: 4 mL/min — ABNORMAL LOW (ref >60)
GFR calc non Af Amer: 4 mL/min — ABNORMAL LOW (ref >60)
GLUCOSE: 87 mg/dL (ref 65–99)
PHOSPHORUS: 13.2 mg/dL — AB (ref 2.5–4.6)
Potassium: 3.8 mmol/L (ref 3.5–5.1)
SODIUM: 129 mmol/L — AB (ref 135–145)

## 2016-09-26 LAB — CBC WITH DIFFERENTIAL/PLATELET
BASOS ABS: 0.1 10*3/uL (ref 0.0–0.1)
Basophils Relative: 1 %
EOS PCT: 5 %
Eosinophils Absolute: 0.5 10*3/uL (ref 0.0–0.7)
HEMATOCRIT: 29 % — AB (ref 39.0–52.0)
Hemoglobin: 9.7 g/dL — ABNORMAL LOW (ref 13.0–17.0)
LYMPHS ABS: 2.8 10*3/uL (ref 0.7–4.0)
LYMPHS PCT: 26 %
MCH: 24.8 pg — AB (ref 26.0–34.0)
MCHC: 33.4 g/dL (ref 30.0–36.0)
MCV: 74.2 fL — AB (ref 78.0–100.0)
MONO ABS: 1.1 10*3/uL — AB (ref 0.1–1.0)
MONOS PCT: 10 %
NEUTROS ABS: 6.3 10*3/uL (ref 1.7–7.7)
Neutrophils Relative %: 59 %
PLATELETS: 235 10*3/uL (ref 150–400)
RBC: 3.91 MIL/uL — ABNORMAL LOW (ref 4.22–5.81)
RDW: 17.3 % — AB (ref 11.5–15.5)
WBC: 10.8 10*3/uL — ABNORMAL HIGH (ref 4.0–10.5)

## 2016-09-26 LAB — GLUCOSE, CAPILLARY
GLUCOSE-CAPILLARY: 92 mg/dL (ref 65–99)
GLUCOSE-CAPILLARY: 109 mg/dL — AB (ref 65–99)
Glucose-Capillary: 94 mg/dL (ref 65–99)
Glucose-Capillary: 86 mg/dL (ref 65–99)
Glucose-Capillary: 94 mg/dL (ref 65–99)
Glucose-Capillary: 92 mg/dL (ref 65–99)

## 2016-09-26 LAB — SURGICAL PCR SCREEN
MRSA, PCR: NEGATIVE
Staphylococcus aureus: POSITIVE — AB

## 2016-09-26 LAB — MAGNESIUM: Magnesium: 2.8 mg/dL — ABNORMAL HIGH (ref 1.7–2.4)

## 2016-09-26 SURGERY — INSERTION OF DIALYSIS CATHETER
Anesthesia: General | Site: Chest | Laterality: Right

## 2016-09-26 MED ORDER — LIDOCAINE HCL 1 % IJ SOLN
INTRAMUSCULAR | Status: AC
  Filled 2016-09-26: qty 20

## 2016-09-26 MED ORDER — PROTAMINE SULFATE 10 MG/ML IV SOLN
INTRAVENOUS | Status: AC
  Filled 2016-09-26: qty 50

## 2016-09-26 MED ORDER — CHLORHEXIDINE GLUCONATE CLOTH 2 % EX PADS
6.0000 | MEDICATED_PAD | Freq: Every day | CUTANEOUS | Status: DC
  Administered 2016-09-26 – 2016-09-27 (×2): 6 via TOPICAL

## 2016-09-26 MED ORDER — SUCCINYLCHOLINE CHLORIDE 20 MG/ML IJ SOLN
INTRAMUSCULAR | Status: DC | PRN
  Administered 2016-09-26: 140 mg via INTRAVENOUS

## 2016-09-26 MED ORDER — MUPIROCIN 2 % EX OINT
1.0000 "application " | TOPICAL_OINTMENT | Freq: Two times a day (BID) | CUTANEOUS | Status: DC
  Administered 2016-09-26 – 2016-09-29 (×7): 1 via NASAL
  Filled 2016-09-26 (×3): qty 22

## 2016-09-26 MED ORDER — IOPAMIDOL (ISOVUE-300) INJECTION 61%
INTRAVENOUS | Status: AC
  Filled 2016-09-26: qty 50

## 2016-09-26 MED ORDER — FENTANYL CITRATE (PF) 250 MCG/5ML IJ SOLN
INTRAMUSCULAR | Status: AC
  Filled 2016-09-26: qty 5

## 2016-09-26 MED ORDER — PROPOFOL 10 MG/ML IV BOLUS
INTRAVENOUS | Status: DC | PRN
  Administered 2016-09-26: 200 mg via INTRAVENOUS
  Administered 2016-09-26: 10 mg via INTRAVENOUS
  Administered 2016-09-26: 150 mg via INTRAVENOUS
  Administered 2016-09-26: 20 mg via INTRAVENOUS

## 2016-09-26 MED ORDER — HEPARIN SODIUM (PORCINE) 5000 UNIT/ML IJ SOLN
5000.0000 [IU] | Freq: Three times a day (TID) | INTRAMUSCULAR | Status: DC
  Administered 2016-09-27 – 2016-09-29 (×6): 5000 [IU] via SUBCUTANEOUS
  Filled 2016-09-26 (×6): qty 1

## 2016-09-26 MED ORDER — HEPARIN SODIUM (PORCINE) 1000 UNIT/ML IJ SOLN
INTRAMUSCULAR | Status: AC
  Filled 2016-09-26: qty 1

## 2016-09-26 MED ORDER — EPHEDRINE 5 MG/ML INJ
INTRAVENOUS | Status: AC
Start: 2016-09-26 — End: 2016-09-26
  Filled 2016-09-26: qty 10

## 2016-09-26 MED ORDER — HEPARIN SODIUM (PORCINE) 1000 UNIT/ML IJ SOLN
INTRAMUSCULAR | Status: DC | PRN
  Administered 2016-09-26: 1000 [IU]

## 2016-09-26 MED ORDER — PROPOFOL 10 MG/ML IV BOLUS
INTRAVENOUS | Status: AC
Start: 2016-09-26 — End: 2016-09-26
  Filled 2016-09-26: qty 20

## 2016-09-26 MED ORDER — DARBEPOETIN ALFA 100 MCG/0.5ML IJ SOSY
PREFILLED_SYRINGE | INTRAMUSCULAR | Status: AC
  Administered 2016-09-26: 100 ug via INTRAVENOUS
  Filled 2016-09-26: qty 0.5

## 2016-09-26 MED ORDER — HEPARIN SODIUM (PORCINE) 5000 UNIT/ML IJ SOLN
INTRAMUSCULAR | Status: DC | PRN
  Administered 2016-09-26: 13:00:00

## 2016-09-26 MED ORDER — 0.9 % SODIUM CHLORIDE (POUR BTL) OPTIME
TOPICAL | Status: DC | PRN
  Administered 2016-09-26: 1000 mL

## 2016-09-26 MED ORDER — PHENYLEPHRINE 40 MCG/ML (10ML) SYRINGE FOR IV PUSH (FOR BLOOD PRESSURE SUPPORT)
PREFILLED_SYRINGE | INTRAVENOUS | Status: AC
  Filled 2016-09-26: qty 10

## 2016-09-26 MED ORDER — LIDOCAINE 2% (20 MG/ML) 5 ML SYRINGE
INTRAMUSCULAR | Status: AC
Start: 2016-09-26 — End: 2016-09-26
  Filled 2016-09-26: qty 10

## 2016-09-26 MED ORDER — ROCURONIUM BROMIDE 10 MG/ML (PF) SYRINGE
PREFILLED_SYRINGE | INTRAVENOUS | Status: AC
  Filled 2016-09-26: qty 10

## 2016-09-26 MED ORDER — FENTANYL CITRATE (PF) 100 MCG/2ML IJ SOLN
INTRAMUSCULAR | Status: AC
  Administered 2016-09-26: 50 ug via INTRAVENOUS
  Filled 2016-09-26: qty 2

## 2016-09-26 MED ORDER — PROTAMINE SULFATE 10 MG/ML IV SOLN
INTRAVENOUS | Status: AC
  Filled 2016-09-26: qty 5

## 2016-09-26 MED ORDER — LIDOCAINE HCL 1 % IJ SOLN
INTRAMUSCULAR | Status: AC
  Filled 2016-09-26: qty 40

## 2016-09-26 MED ORDER — LIDOCAINE HCL (CARDIAC) 20 MG/ML IV SOLN
INTRAVENOUS | Status: DC | PRN
  Administered 2016-09-26: 100 mg via INTRAVENOUS

## 2016-09-26 MED ORDER — PHENYLEPHRINE HCL 10 MG/ML IJ SOLN
INTRAMUSCULAR | Status: DC | PRN
  Administered 2016-09-26 (×2): 40 ug via INTRAVENOUS

## 2016-09-26 MED ORDER — MIDAZOLAM HCL 5 MG/5ML IJ SOLN
INTRAMUSCULAR | Status: DC | PRN
  Administered 2016-09-26 (×2): 1 mg via INTRAVENOUS

## 2016-09-26 MED ORDER — FENTANYL CITRATE (PF) 100 MCG/2ML IJ SOLN
25.0000 ug | INTRAMUSCULAR | Status: DC | PRN
  Administered 2016-09-26: 50 ug via INTRAVENOUS

## 2016-09-26 MED ORDER — FENTANYL CITRATE (PF) 100 MCG/2ML IJ SOLN
INTRAMUSCULAR | Status: DC | PRN
  Administered 2016-09-26: 50 ug via INTRAVENOUS
  Administered 2016-09-26: 25 ug via INTRAVENOUS
  Administered 2016-09-26: 50 ug via INTRAVENOUS
  Administered 2016-09-26 (×3): 25 ug via INTRAVENOUS
  Administered 2016-09-26: 50 ug via INTRAVENOUS

## 2016-09-26 MED ORDER — SUGAMMADEX SODIUM 200 MG/2ML IV SOLN
INTRAVENOUS | Status: AC
Start: 2016-09-26 — End: 2016-09-26
  Filled 2016-09-26: qty 2

## 2016-09-26 MED ORDER — HEPARIN SODIUM (PORCINE) 1000 UNIT/ML IJ SOLN
INTRAMUSCULAR | Status: DC | PRN
  Administered 2016-09-26: 8000 [IU] via INTRAVENOUS

## 2016-09-26 MED ORDER — ONDANSETRON HCL 4 MG/2ML IJ SOLN
INTRAMUSCULAR | Status: AC
  Filled 2016-09-26: qty 4

## 2016-09-26 SURGICAL SUPPLY — 54 items
ARMBAND PINK RESTRICT EXTREMIT (MISCELLANEOUS) ×8 IMPLANT
BAG DECANTER FOR FLEXI CONT (MISCELLANEOUS) ×4 IMPLANT
BIOPATCH RED 1 DISK 7.0 (GAUZE/BANDAGES/DRESSINGS) ×4 IMPLANT
CANISTER SUCT 3000ML PPV (MISCELLANEOUS) ×4 IMPLANT
CANNULA VESSEL 3MM 2 BLNT TIP (CANNULA) ×4 IMPLANT
CATH PALINDROME RT-P 15FX23CM (CATHETERS) ×4 IMPLANT
CHLORAPREP W/TINT 26ML (MISCELLANEOUS) ×4 IMPLANT
CLIP TI MEDIUM 6 (CLIP) ×4 IMPLANT
CLIP TI WIDE RED SMALL 6 (CLIP) ×4 IMPLANT
COVER PROBE W GEL 5X96 (DRAPES) ×4 IMPLANT
COVER SURGICAL LIGHT HANDLE (MISCELLANEOUS) ×4 IMPLANT
DECANTER SPIKE VIAL GLASS SM (MISCELLANEOUS) ×4 IMPLANT
DERMABOND ADVANCED (GAUZE/BANDAGES/DRESSINGS) ×2
DERMABOND ADVANCED .7 DNX12 (GAUZE/BANDAGES/DRESSINGS) ×6 IMPLANT
DRAPE C-ARM 42X72 X-RAY (DRAPES) ×4 IMPLANT
DRAPE CHEST BREAST 15X10 FENES (DRAPES) ×4 IMPLANT
ELECT CAUTERY BLADE 6.4 (BLADE) ×4 IMPLANT
ELECT REM PT RETURN 9FT ADLT (ELECTROSURGICAL) ×4
ELECTRODE REM PT RTRN 9FT ADLT (ELECTROSURGICAL) ×3 IMPLANT
GAUZE SPONGE 4X4 16PLY XRAY LF (GAUZE/BANDAGES/DRESSINGS) ×4 IMPLANT
GLOVE BIO SURGEON STRL SZ 6.5 (GLOVE) ×8 IMPLANT
GLOVE BIO SURGEON STRL SZ7.5 (GLOVE) ×8 IMPLANT
GLOVE BIOGEL PI IND STRL 6.5 (GLOVE) ×6 IMPLANT
GLOVE BIOGEL PI IND STRL 7.0 (GLOVE) ×3 IMPLANT
GLOVE BIOGEL PI INDICATOR 6.5 (GLOVE) ×2
GLOVE BIOGEL PI INDICATOR 7.0 (GLOVE) ×1
GLOVE ECLIPSE 6.5 STRL STRAW (GLOVE) ×8 IMPLANT
GLOVE SURG SS PI 6.5 STRL IVOR (GLOVE) ×4 IMPLANT
GOWN STRL REUS W/ TWL LRG LVL3 (GOWN DISPOSABLE) ×18 IMPLANT
GOWN STRL REUS W/TWL LRG LVL3 (GOWN DISPOSABLE) ×6
KIT BASIN OR (CUSTOM PROCEDURE TRAY) ×4 IMPLANT
KIT ROOM TURNOVER OR (KITS) ×4 IMPLANT
NEEDLE 18GX1X1/2 (RX/OR ONLY) (NEEDLE) ×4 IMPLANT
NEEDLE HYPO 25GX1X1/2 BEV (NEEDLE) ×4 IMPLANT
NS IRRIG 1000ML POUR BTL (IV SOLUTION) ×4 IMPLANT
PACK CV ACCESS (CUSTOM PROCEDURE TRAY) ×4 IMPLANT
PACK SURGICAL SETUP 50X90 (CUSTOM PROCEDURE TRAY) ×4 IMPLANT
PAD ARMBOARD 7.5X6 YLW CONV (MISCELLANEOUS) ×8 IMPLANT
PENCIL BUTTON HOLSTER BLD 10FT (ELECTRODE) ×4 IMPLANT
SUT ETHILON 3 0 PS 1 (SUTURE) ×4 IMPLANT
SUT PROLENE 6 0 CC (SUTURE) ×12 IMPLANT
SUT PROLENE 7 0 BV 1 (SUTURE) ×4 IMPLANT
SUT VIC AB 3-0 SH 27 (SUTURE) ×2
SUT VIC AB 3-0 SH 27X BRD (SUTURE) ×6 IMPLANT
SUT VICRYL 4-0 PS2 18IN ABS (SUTURE) ×8 IMPLANT
SYR 10ML LL (SYRINGE) ×4 IMPLANT
SYR 20CC LL (SYRINGE) ×8 IMPLANT
SYR 5ML LL (SYRINGE) ×4 IMPLANT
SYR CONTROL 10ML LL (SYRINGE) ×4 IMPLANT
TOWEL OR 17X24 6PK STRL BLUE (TOWEL DISPOSABLE) ×4 IMPLANT
TOWEL OR 17X26 10 PK STRL BLUE (TOWEL DISPOSABLE) ×4 IMPLANT
UNDERPAD 30X30 (UNDERPADS AND DIAPERS) ×4 IMPLANT
WATER STERILE IRR 1000ML POUR (IV SOLUTION) ×4 IMPLANT
WIRE HI TORQ VERSACORE J 260CM (WIRE) ×4 IMPLANT

## 2016-09-26 NOTE — Progress Notes (Signed)
Patient arrived to unit by bed.  Reviewed treatment plan and this RN agrees with plan.  Report received from bedside RN, Elmyra Ricks.  Consent verified.  Patient A & O X 4.   Lung sounds diminished to ausculation in all fields. Generalized edema. Cardiac:  NSR.  Removed caps and cleansed RIJ catheter with chlorhedxidine.  Aspirated ports of heparin and flushed them with saline per protocol.  Connected and secured lines, initiated treatment at 1704.  UF Goal of 3500 mL and net fluid removal 3 L.  Will continue to monitor.

## 2016-09-26 NOTE — Anesthesia Postprocedure Evaluation (Signed)
Anesthesia Post Note  Patient: Paul [name]  Procedure(s) Performed: Procedure(s) (LRB): INSERTION OF DIALYSIS CATHETER - Right Internal Jugular Placement (Right) INSERTION OF ARTERIOVENOUS (AV) GORE-TEX GRAFT VS. FISTULA  ARM (Left)  Patient location during evaluation: PACU Anesthesia Type: General Level of consciousness: patient remains intubated per anesthesia plan Pain management: pain level controlled Vital Signs Assessment: post-procedure vital signs reviewed and stable Respiratory status: patient remains intubated per anesthesia plan Cardiovascular status: stable Anesthetic complications: no       Last Vitals:  Vitals:   09/26/16 0328 09/26/16 0800  BP: 127/73 (!) 142/76  Pulse: (!) 105 (!) 108  Resp: (!) 25   Temp: 37.3 C 36.4 C    Last Pain:  Vitals:   09/26/16 0800  TempSrc: Oral  PainSc:                  Paul [name]

## 2016-09-26 NOTE — Op Note (Signed)
Procedure: Insert Palindrome catheter 23 cm right internal jugular, Left Brachial Cephalic AV fistula  Preop: ESRD  Postop: ESRD  Anesthesia: General  Assistant: Leontine Locket PA-C  Findings: 1.  3.5 mm cephalic vein. 2.  23 cm Palindrome  Procedure:    After obtaining informed consent, the patient was taken to the operating room. The patient was placed in supine position on the operating room table. After commencing general anesthesia, the patient's entire neck and chest were prepped and draped in usual sterile fashion. The patient was placed in Trendelenburg position. Ultrasound was used to identify the patient's right internal jugular vein. This had normal compressibility and respiratory variation. Local anesthesia was infiltrated over the right jugular vein.  Using ultrasound guidance, the right internal jugular vein was successfully cannulated.  A 0.035 J-tipped guidewire was threaded into the right internal jugular vein and into the superior vena cava followed by the inferior vena cava under fluoroscopic guidance.   Next sequential 12 and 14 dilators were placed over the guidewire into the right atrium.  A 16 French dilator with a peel-away sheath was then placed over the guidewire into the right atrium.   The guidewire and dilator were removed. A 23 cm Palindrome catheter was then placed through the peel away sheath into the right atrium.  The catheter was then tunneled subcutaneously, cut to length, and the hub attached. The catheter was noted to flush and draw easily. The catheter was inspected under fluoroscopy and found with its tip to be in the right atrium without any kinks throughout its course. The catheter was sutured to the skin with nylon sutures. The neck insertion site was closed with Vicryl stitch. The catheter was then loaded with concentrated Heparin solution. A dry sterile dressing was applied.  The left upper extremity was prepped and draped in usual sterile fashion.  A  transverse incision was then made near the antecubital crease the left arm. The incision was carried into the subcutaneous tissues down to level of the cephalic vein. The cephalic vein was approximately 3 mm in diameter. It was of good quality but there was a lengthy distance between the artery and vein secondary to the patient's obesity. This was dissected free circumferentially and small side branches ligated and divided between silk ties or clips. Next the brachial artery was dissected free in the medial portion of the incision. The artery was  2.5 mm in diameter. The vessel loops were placed proximal and distal to the planned site of arteriotomy. The patient was given 7000 units of intravenous heparin. After appropriate circulation time, the vessel loops were used to control the artery. A longitudinal opening was made in the brachial artery.  The vein was ligated distally with a 2-0 silk tie. The vein was controlled proximally with a fine bulldog clamp. The vein was then swung over to the artery and sewn end of vein to side of artery using a running 7-0 Prolene suture. Just prior to completion of the anastomosis, everything was fore bled back bled and thoroughly flushed. The anastomosis was secured, vessel loops released, and there was a palpable thrill in the fistula immediately. After hemostasis was obtained, the subcutaneous tissues were reapproximated using a running 3-0 Vicryl suture. The skin was then closed with a 4 0 Vicryl subcuticular stitch. Dermabond was applied to the skin incision.  The patient had a palpable radial pulse at the end of the case.  Ruta Hinds, MD [facility] [place] Office: [phone] Pager: [phone]

## 2016-09-26 NOTE — Interval H&P Note (Signed)
History and Physical Interval Note:  09/26/2016 11:16 AM  Paul [name]  has presented today for surgery, with the diagnosis of ESRD  The various methods of treatment have been discussed with the patient and family. After consideration of risks, benefits and other options for treatment, the patient has consented to  Procedure(s): INSERTION OF DIALYSIS CATHETER (N/A) INSERTION OF ARTERIOVENOUS (AV) GORE-TEX GRAFT VS. FISTULA  ARM (Left) as a surgical intervention .  The patient's history has been reviewed, patient examined, no change in status, stable for surgery.  I have reviewed the patient's chart and labs.  Questions were answered to the patient's satisfaction.     Ruta Hinds

## 2016-09-26 NOTE — H&P (View-Only) (Signed)
  [facility] Progress Note  Vein mapping reviewed. Patient right handed. He has adequate conduit for left radial versus brachial cephalic fistula. Plan for left arm fistula and TDC insertion tomorrow. Procedure discussed with patient and he is willing to proceed. NPO past midnight. Obtain consent.   Virgina Jock, PA-C [facility] Office: [phone] Pager: [phone] 09/25/2016 8:47 AM   Agree with above.  Vein map reviewed.  Upper arm cephalic is going to be best choice will need to superficialize later when mature.  Catheter tomorrow as well   NPO p midnight Consent  Ruta Hinds, MD [facility] [place] Office: [phone] Pager: [phone]

## 2016-09-26 NOTE — Anesthesia Procedure Notes (Signed)
Procedure Name: Intubation Date/Time: 09/26/2016 12:16 PM Performed by: Merdis Delay Pre-anesthesia Checklist: Patient identified, Emergency Drugs available, Suction available, Patient being monitored and Timeout performed Patient Re-evaluated:Patient Re-evaluated prior to inductionOxygen Delivery Method: Circle system utilized Preoxygenation: Pre-oxygenation with 100% oxygen Intubation Type: IV induction Ventilation: Mask ventilation without difficulty Laryngoscope Size: Glidescope and 4 Grade View: Grade I Tube type: Oral Tube size: 7.5 mm Number of attempts: 1 Airway Equipment and Method: Stylet Placement Confirmation: ETT inserted through vocal cords under direct vision,  positive ETCO2,  CO2 detector and breath sounds checked- equal and bilateral Secured at: 25 cm Tube secured with: Tape Dental Injury: Teeth and Oropharynx as per pre-operative assessment  Difficulty Due To: Difficulty was anticipated Comments: Attempted LMA insertion x2, went to Glide and performed intubation x1 attempt by Elsie Ra

## 2016-09-26 NOTE — Progress Notes (Signed)
SLP Cancellation Note  Patient Details Name: Shoua Ressler MRN: [id number] DOB: 16-Dec-1990   Cancelled treatment:       Reason Eval/Treat Not Completed: Patient at procedure or test/unavailable. Pt to have procedure at 12, then HD. Will not be able to participate in FEES until tomorrow.   Herbie [place], [place] CCC-SLP [phone]  Lynann Beaver 09/26/2016, 9:16 AM

## 2016-09-26 NOTE — Transfer of Care (Signed)
Immediate Anesthesia Transfer of Care Note  Patient: Paul [name]  Procedure(s) Performed: Procedure(s): INSERTION OF DIALYSIS CATHETER - Right Internal Jugular Placement (Right) LEFT UPPER ARM ARTERIOVENOUS (AV) FISTULA CREATION (Left)  Patient Location: PACU  Anesthesia Type:General  Level of Consciousness: awake, alert , oriented and patient cooperative  Airway & Oxygen Therapy: Patient Spontanous Breathing and Patient connected to face mask oxygen  Post-op Assessment: Report given to RN, Post -op Vital signs reviewed and stable and Patient moving all extremities  Post vital signs: Reviewed and stable  Last Vitals:  Vitals:   09/26/16 0328 09/26/16 0800  BP: 127/73 (!) 142/76  Pulse: (!) 105 (!) 108  Resp: (!) 25   Temp: 37.3 C 36.4 C    Last Pain:  Vitals:   09/26/16 0800  TempSrc: Oral  PainSc:       Patients Stated Pain Goal: 1 (77/93/96 8864)  Complications: No apparent anesthesia complications

## 2016-09-26 NOTE — Progress Notes (Signed)
PT Cancellation Note  Patient Details Name: Paul [name] MRN: [id number] DOB: 01-19-1991   Cancelled Treatment:    Reason Eval/Treat Not Completed: Patient at procedure or test/unavailable. Pt off floor for PC placement then plan is for HD this afternoon.    Lorriane Shire 09/26/2016, 10:16 AM

## 2016-09-26 NOTE — Anesthesia Preprocedure Evaluation (Signed)
Anesthesia Evaluation  Patient identified by MRN, date of birth, ID band Patient awake    Reviewed: Allergy & Precautions, NPO status , Patient's Chart, lab work & pertinent test results  Airway Mallampati: II  TM Distance: >3 FB     Dental   Pulmonary pneumonia,    breath sounds clear to auscultation       Cardiovascular hypertension,  Rhythm:Regular Rate:Normal     Neuro/Psych    GI/Hepatic negative GI ROS, Neg liver ROS,   Endo/Other  diabetesHypothyroidism   Renal/GU Renal disease     Musculoskeletal   Abdominal   Peds  Hematology   Anesthesia Other Findings   Reproductive/Obstetrics                             Anesthesia Physical Anesthesia Plan  ASA: III  Anesthesia Plan: General   Post-op Pain Management:    Induction: Intravenous  Airway Management Planned: LMA  Additional Equipment:   Intra-op Plan:   Post-operative Plan: Extubation in OR  Informed Consent: I have reviewed the patients History and Physical, chart, labs and discussed the procedure including the risks, benefits and alternatives for the proposed anesthesia with the patient or authorized representative who has indicated his/her understanding and acceptance.   Dental advisory given  Plan Discussed with: CRNA and Anesthesiologist  Anesthesia Plan Comments:         Anesthesia Quick Evaluation

## 2016-09-26 NOTE — Progress Notes (Signed)
Dialysis treatment terminated per patient with 40 minutes remaining d/t malaise.  2900 mL ultrafiltrated.  2400 mL net fluid removal.  Patient status unchanged. Lung sounds diminished to ausculation in all fields. Generalized edema. Cardiac: NSR.  Cleansed RIJ catheter with chlorhexidine.  Disconnected lines and flushed ports with saline per protocol.  Ports locked with heparin and capped per protocol.    Report given to bedside, RN Tish.

## 2016-09-26 NOTE — Progress Notes (Signed)
Paul [name] TEAM 1 - Stepdown/ICU TEAM  Paul [name]  EXH:[id number] DOB: 04/06/1991 DOA: 09/11/2016 PCP: Janora Norlander, DO   Brief Narrative:  26 year old male with history of diabetes, hypertension, hyperlipidemia, renal insufficiency and morbid obesity who presented to the ER with respiratory distress. He was noted to be hypoxic and tachypnea and was placed on BiPAP. About a week prior to admission he was placed on Z-Pak by his PCP without any improvement. He was noted to have significantly elevated creatinine and was anuric therefore emergent dialysis was performed. He was also intubated due to no improvement on BiPAP. During his time in the ICU he self extubated but had to be reintubated on April 26 due to mucous plugging in the right main bronchus. In the ICU he received CRRT, which was transitioned to intermittent dialysis on 09/16/2016. Initial echocardiogram on April 22 showed ejection fraction of 60-65 percent with moderate sized pericardial effusion but with dialysis it improved and repeat echocardiogram noted only a small effusion. Renal ultrasound showed medical disease.  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  To go from recovery to HD.  Was seen by Nephrology and Vasc Surgery today.     Objective: Vitals:   09/26/16 1540 09/26/16 1610 09/26/16 1625 09/26/16 1635  BP: 138/72 (!) 150/84 (!) 147/76 (!) 146/68  Pulse: 94 93 92   Resp: (!) 28 15 15 15   Temp: 98.1 F (36.7 C)   97.3 F (36.3 C)  TempSrc:      SpO2: 100% 99% 98%   Weight:      Height:        Intake/Output Summary (Last 24 hours) at 09/26/16 1703 Last data filed at 09/26/16 1600  Gross per 24 hour  Intake              850 ml  Output               35 ml  Net              815 ml   Filed Weights   09/24/16 0500 09/25/16 0434 09/26/16 0500  Weight: (!) 150 kg (330 lb 11 oz) (!) 151 kg (332 lb 14.3 oz) (!) 151 kg (332 lb 14.3 oz)    Examination:    Data Reviewed:   CBC:  Recent Labs Lab 09/22/16 0500 09/23/16 0500 09/24/16 1214 09/25/16 0306 09/26/16 0214  WBC 17.7* 15.3* 12.4* 11.4* 10.8*  NEUTROABS 11.4* 9.6* 8.0* 7.5 6.3  HGB 10.3* 10.0* 11.5* 10.1* 9.7*  HCT 32.0* 30.4* 34.9* 29.8* 29.0*  MCV 78.4 77.6* 76.9* 75.6* 74.2*  PLT 267  248 253 231 545   Basic Metabolic Panel:  Recent Labs Lab 09/22/16 0500 09/23/16 0500 09/24/16 1214 09/25/16 0306 09/26/16 0214  NA 135 132* 128* 128* 129*  K 3.6 3.4* 3.6 4.2 3.8  CL 90* 87* 85* 85* 86*  CO2 24 21* 17* 15* 13*  GLUCOSE 118* 99 113* 82 87  BUN 64* 99* 96* 115* 139*  CREATININE 8.00* 11.04* 11.52* 13.21* 15.88*  CALCIUM 9.8 9.4 9.4 8.6* 8.4*  MG 2.6* 2.9* 2.8* 2.8* 2.8*  PHOS 7.7* 9.9* 10.9* 11.4* 13.2*   GFR: Estimated Creatinine Clearance: 11.1 mL/min (A) (by C-G formula based on SCr of 15.88 mg/dL (H)). Liver Function Tests:  Recent Labs Lab 09/22/16 0500 09/23/16 0500 09/24/16 1214 09/25/16 0306 09/26/16 0214  ALBUMIN 2.9* 2.7* 3.1* 2.9* 2.9*   Cardiac Enzymes:  Recent Labs Lab  09/19/16 1859 09/20/16 0030 09/20/16 0615  TROPONINI 0.03* <0.03 0.12*   CBG:  Recent Labs Lab 09/25/16 2344 09/26/16 0326 09/26/16 0810 09/26/16 1028 09/26/16 1546  GLUCAP 94 86 92 94 109*    Recent Results (from the past 240 hour(s))  Surgical PCR screen     Status: Abnormal   Collection Time: 09/26/16 12:16 AM  Result Value Ref Range Status   MRSA, PCR NEGATIVE NEGATIVE Final   Staphylococcus aureus POSITIVE (A) NEGATIVE Final    Comment:        The Xpert SA Assay (FDA approved for NASAL specimens in patients over 24 years of age), is one component of a comprehensive surveillance program.  Test performance has been validated by [facility] for patients greater than or equal to 67 year old. It is not intended to diagnose infection nor to guide or monitor treatment.      Scheduled Meds: . [MAR Hold] amLODipine  10 mg Per Tube Daily  . [MAR Hold] Chlorhexidine Gluconate Cloth  6 each Topical Daily  . [MAR Hold] darbepoetin (ARANESP) injection - DIALYSIS  100 mcg Intravenous Q Mon-HD  . [MAR Hold] heparin subcutaneous  5,000 Units Subcutaneous Q8H  . [MAR Hold] insulin aspart  1-3 Units Subcutaneous Q4H  . [MAR Hold] levalbuterol  1.25 mg Nebulization BID  . [MAR Hold] levothyroxine  75 mcg Intravenous Daily  . [MAR Hold] lidocaine  1 patch Transdermal Q24H  . [MAR Hold] mupirocin ointment  1 application Nasal BID  . [MAR Hold] sodium chloride flush  10-40 mL Intracatheter Q12H    Cherene Altes, MD [facility] Office  [phone] Pager - Text Page per Amion as per below:  On-Call/Text Page:      [email or web address]      password [email or web address]  If 7PM-7AM, please contact night-coverage [email or web address] Password [email or web address] 09/26/2016, 5:11 PM

## 2016-09-26 NOTE — Progress Notes (Signed)
S:No new CO.  Discussed his CKD and the fact that this may be permanent but time will tell O:BP 127/73 (BP Location: Right Arm)   Pulse (!) 105   Temp 99.2 F (37.3 C) (Oral)   Resp (!) 25   Ht 6\' 2"  (1.88 m)   Wt (!) 151 kg (332 lb 14.3 oz)   SpO2 97%   BMI 42.74 kg/m   Intake/Output Summary (Last 24 hours) at 09/26/16 0739 Last data filed at 09/25/16 0920  Gross per 24 hour  Intake               10 ml  Output                0 ml  Net               10 ml   Weight change: 0 kg (0 lb) JME:QASTM and alert CVS:Sl tachy, regular Resp: Clear Abd:+ BS NTND Ext:No edema NEURO:CNI Ox3, no asterixis   . amLODipine  10 mg Per Tube Daily  . Chlorhexidine Gluconate Cloth  6 each Topical Daily  . darbepoetin (ARANESP) injection - DIALYSIS  100 mcg Intravenous Q Mon-HD  . heparin subcutaneous  5,000 Units Subcutaneous Q8H  . insulin aspart  1-3 Units Subcutaneous Q4H  . levalbuterol  1.25 mg Nebulization BID  . levothyroxine  75 mcg Intravenous Daily  . lidocaine  1 patch Transdermal Q24H  . mupirocin ointment  1 application Nasal BID  . sodium chloride flush  10-40 mL Intracatheter Q12H   No results found. BMET    Component Value Date/Time   NA 129 (L) 09/26/2016 0214   K 3.8 09/26/2016 0214   CL 86 (L) 09/26/2016 0214   CO2 13 (L) 09/26/2016 0214   GLUCOSE 87 09/26/2016 0214   BUN 139 (H) 09/26/2016 0214   CREATININE 15.88 (H) 09/26/2016 0214   CREATININE 1.85 (H) 11/28/2014 1153   CALCIUM 8.4 (L) 09/26/2016 0214   GFRNONAA 4 (L) 09/26/2016 0214   GFRNONAA 51 (L) 11/28/2014 1153   GFRAA 4 (L) 09/26/2016 0214   GFRAA 58 (L) 11/28/2014 1153   CBC    Component Value Date/Time   WBC 10.8 (H) 09/26/2016 0214   RBC 3.91 (L) 09/26/2016 0214   HGB 9.7 (L) 09/26/2016 0214   HCT 29.0 (L) 09/26/2016 0214   PLT 235 09/26/2016 0214   MCV 74.2 (L) 09/26/2016 0214   MCH 24.8 (L) 09/26/2016 0214   MCHC 33.4 09/26/2016 0214   RDW 17.3 (H) 09/26/2016 0214   LYMPHSABS 2.8  09/26/2016 0214   MONOABS 1.1 (H) 09/26/2016 0214   EOSABS 0.5 09/26/2016 0214   BASOSABS 0.1 09/26/2016 0214     Assessment:  1. Acute on CKD vs ESRD.  Suspicion from previous Dr is the later.  Last Scr available from 7/16 and it was 1.85 2. Sepsis/CAP 3. Anemia on ESA 4. 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 Mod pericardial effusion 6. DM 7. HTN  Plan: 1. For PC and access today then HD     Paul [name]

## 2016-09-27 ENCOUNTER — Encounter (HOSPITAL_COMMUNITY): Payer: Self-pay | Admitting: Vascular Surgery

## 2016-09-27 ENCOUNTER — Telehealth: Payer: Self-pay | Admitting: Vascular Surgery

## 2016-09-27 DIAGNOSIS — Z992 Dependence on renal dialysis: Secondary | ICD-10-CM

## 2016-09-27 DIAGNOSIS — Z95828 Presence of other vascular implants and grafts: Secondary | ICD-10-CM

## 2016-09-27 LAB — GLUCOSE, CAPILLARY
GLUCOSE-CAPILLARY: 93 mg/dL (ref 65–99)
Glucose-Capillary: 113 mg/dL — ABNORMAL HIGH (ref 65–99)
Glucose-Capillary: 87 mg/dL (ref 65–99)
Glucose-Capillary: 107 mg/dL — ABNORMAL HIGH (ref 65–99)
Glucose-Capillary: 148 mg/dL — ABNORMAL HIGH (ref 65–99)
Glucose-Capillary: 112 mg/dL — ABNORMAL HIGH (ref 65–99)
Glucose-Capillary: 103 mg/dL — ABNORMAL HIGH (ref 65–99)

## 2016-09-27 MED ORDER — OXYCODONE HCL 5 MG PO TABS
5.0000 mg | ORAL_TABLET | Freq: Four times a day (QID) | ORAL | Status: AC | PRN
Start: 2016-09-27 — End: 2016-09-28
  Administered 2016-09-27 – 2016-09-28 (×2): 5 mg via ORAL
  Filled 2016-09-27 (×2): qty 1

## 2016-09-27 MED ORDER — ACETAMINOPHEN 325 MG PO TABS
650.0000 mg | ORAL_TABLET | Freq: Four times a day (QID) | ORAL | Status: DC | PRN
  Administered 2016-09-28: 650 mg via ORAL
  Filled 2016-09-27: qty 2

## 2016-09-27 MED ORDER — RESOURCE THICKENUP CLEAR PO POWD
ORAL | Status: DC | PRN
  Filled 2016-09-27 (×2): qty 125

## 2016-09-27 MED ORDER — METOPROLOL TARTRATE 12.5 MG HALF TABLET
12.5000 mg | ORAL_TABLET | Freq: Two times a day (BID) | ORAL | Status: DC
  Administered 2016-09-27 – 2016-09-28 (×3): 12.5 mg via ORAL
  Filled 2016-09-27 (×4): qty 1

## 2016-09-27 NOTE — Progress Notes (Signed)
S: Feels well.  Says he has been up to chair O:BP (!) 149/76 (BP Location: Right Leg)   Pulse (!) 109   Temp 97.9 F (36.6 C) (Oral)   Resp 11   Ht 6\' 2"  (1.88 m)   Wt (!) 153 kg (337 lb 4.9 oz)   SpO2 100%   BMI 43.31 kg/m   Intake/Output Summary (Last 24 hours) at 09/27/16 0810 Last data filed at 09/26/16 2035  Gross per 24 hour  Intake              850 ml  Output             2435 ml  Net            -1585 ml   Weight change: 0 kg (0 lb) PXT:GGYIR and alert CVS:Sl tachy, regular Resp: Clear Abd:+ BS NTND Ext:No edema  LUA AVF NEURO:CNI Ox3, no asterixis Rt IJ PC   . amLODipine  10 mg Per Tube Daily  . Chlorhexidine Gluconate Cloth  6 each Topical Daily  . darbepoetin (ARANESP) injection - DIALYSIS  100 mcg Intravenous Q Mon-HD  . heparin subcutaneous  5,000 Units Subcutaneous Q8H  . insulin aspart  1-3 Units Subcutaneous Q4H  . levalbuterol  1.25 mg Nebulization BID  . levothyroxine  75 mcg Intravenous Daily  . lidocaine  1 patch Transdermal Q24H  . mupirocin ointment  1 application Nasal BID  . sodium chloride flush  10-40 mL Intracatheter Q12H   Dg Chest Port 1 View  Result Date: 09/26/2016 CLINICAL DATA:  Post dialysis catheter insertion, history diabetes mellitus, hypertension EXAM: PORTABLE CHEST 1 VIEW COMPARISON:  Portable exam 1618 hours compared to 09/20/2016 FINDINGS: RIGHT jugular central venous catheter with tip projecting over SVC. Normal heart size, mediastinal contours, and pulmonary vascularity. Tips of lung apices excluded. Lungs otherwise clear. No pleural effusion or pneumothorax. Bones unremarkable. IMPRESSION: No gross evidence of pneumothorax following RIGHT jugular line insertion. Electronically Signed   By: Lavonia Dana M.D.   On: 09/26/2016 16:28   Dg Fluoro Guide Cv Line-no Report  Result Date: 09/26/2016 Fluoroscopy was utilized by the requesting physician.  No radiographic interpretation.   BMET    Component Value Date/Time   NA 129 (L)  09/26/2016 0214   K 3.8 09/26/2016 0214   CL 86 (L) 09/26/2016 0214   CO2 13 (L) 09/26/2016 0214   GLUCOSE 87 09/26/2016 0214   BUN 139 (H) 09/26/2016 0214   CREATININE 15.88 (H) 09/26/2016 0214   CREATININE 1.85 (H) 11/28/2014 1153   CALCIUM 8.4 (L) 09/26/2016 0214   GFRNONAA 4 (L) 09/26/2016 0214   GFRNONAA 51 (L) 11/28/2014 1153   GFRAA 4 (L) 09/26/2016 0214   GFRAA 58 (L) 11/28/2014 1153   CBC    Component Value Date/Time   WBC 10.8 (H) 09/26/2016 0214   RBC 3.91 (L) 09/26/2016 0214   HGB 9.7 (L) 09/26/2016 0214   HCT 29.0 (L) 09/26/2016 0214   PLT 235 09/26/2016 0214   MCV 74.2 (L) 09/26/2016 0214   MCH 24.8 (L) 09/26/2016 0214   MCHC 33.4 09/26/2016 0214   RDW 17.3 (H) 09/26/2016 0214   LYMPHSABS 2.8 09/26/2016 0214   MONOABS 1.1 (H) 09/26/2016 0214   EOSABS 0.5 09/26/2016 0214   BASOSABS 0.1 09/26/2016 0214     Assessment:  1. Acute on CKD vs ESRD.  Suspicion from previous Dr is the later.  Last Scr available from 7/16  was 1.85 2. Sepsis/CAP 3. Anemia  on ESA 4. 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 Mod pericardial effusion 6. DM 7. HTN  Plan: 1. HD tomorrow and will check CBC/Renal with HD 2. Working on outpt spot 3. Start PO4 binder when taking PO 4. Start renavite when taking PO    Meredeth Furber T

## 2016-09-27 NOTE — Progress Notes (Signed)
Patient being transferred to [place]. Report called to receiving nurse. All questions answered. Family notified of transfer/room changed.

## 2016-09-27 NOTE — Telephone Encounter (Signed)
-----   Message from Mena Goes, RN sent at 09/26/2016  3:19 PM EDT ----- Regarding: 4-6 weeks w/ duplex   ----- Message ----- From: Gabriel Earing, PA-C Sent: 09/26/2016   3:02 PM To: Vvs Charge Pool  s/p left brachial cephalic AVF 3/0/07.  f/u with Dr. Oneida Alar (MD only as he is going to need superficialization) in 4-6 weeks with duplex.  Thanks, Aldona Bar

## 2016-09-27 NOTE — Telephone Encounter (Signed)
LVM for pt to call back to confirm/make appt  sched for 6/27 [place] 6/28 OV

## 2016-09-27 NOTE — Progress Notes (Addendum)
Physical Therapy Treatment Patient Details Name: Paul [name] MRN: [id number] DOB: May 12, 1991 Today's Date: 09/27/2016    History of Present Illness 26 yo admitted with sepsis, respiratory failure and renal failure s/p VDRF 4/22-4/26, 4/26-5/2, and CRRT. PMHx: obesity, DM, HTN, renal insufficiency    PT Comments    Patient progressing with in room ambulation.  Remains nervous about not being able to make it to door and back initially asking if a chair would be available, then stating would only walk to bathroom doorn and back to bed, but did make it to door to room and back but rushed due to fear of fatigue.  Will benefit from skilled PT in the acute setting to allow return home at d/c.    Follow Up Recommendations  Home health PT;Supervision/Assistance - 24 hour     Equipment Recommendations  Rolling walker with 5" wheels;3in1 (PT)    Recommendations for Other Services       Precautions / Restrictions Precautions Precautions: Fall Precaution Comments: watch HR    Mobility  Bed Mobility Overal bed mobility: Needs Assistance Bed Mobility: Rolling;Sidelying to Sit Rolling: Min assist Sidelying to sit: Min assist       General bed mobility comments: use of bed rail to come upright, guided legs forward  Transfers Overall transfer level: Needs assistance Equipment used: Rolling walker (2 wheeled) Transfers: Sit to/from [facility] Sit to Stand: Mod assist;+2 safety/equipment Stand pivot transfers: Mod assist;+2 safety/equipment       General transfer comment: assist for lifting with momentum from EOB and for stability with pivot to [facility]  Ambulation/Gait Ambulation/Gait assistance: Min assist;+2 safety/equipment Ambulation Distance (Feet): 12 Feet Assistive device: Rolling walker (2 wheeled) Gait Pattern/deviations: Step-to pattern;Decreased stride length;Shuffle;Wide base of support     General Gait Details: fearful to attempt, but willing, assist with  safety and for maneuvering obstacles in room, pt rushing to get back to bed and had asked if we would have a chair to follow him   Stairs            Wheelchair Mobility    Modified Rankin (Stroke Patients Only)       Balance Overall balance assessment: Needs assistance   Sitting balance-Leahy Scale: Good       Standing balance-Leahy Scale: Poor                              Cognition Arousal/Alertness: Awake/alert Behavior During Therapy: WFL for tasks assessed/performed Overall Cognitive Status: No family/caregiver present to determine baseline cognitive functioning                                 General Comments: oriented to recent events with surgery yesterday for R IJ catheter and L Brachiocephalic fistula; seems somewhat slow to follow commands and some decreased safety awareness      Exercises      General Comments General comments (skin integrity, edema, etc.): HR max to 147 with mobility, mantains at rest seated EOB in 120's-130's, in supine 118      Pertinent Vitals/Pain Pain Assessment: No/denies pain    Home Living                      Prior Function            PT Goals (current goals can now be found in the care plan  section) Progress towards PT goals: Progressing toward goals    Frequency    Min 3X/week      PT Plan Current plan remains appropriate    Co-evaluation              AM-PAC PT "6 Clicks" Daily Activity  Outcome Measure  Difficulty turning over in bed (including adjusting bedclothes, sheets and blankets)?: Total Difficulty moving from lying on back to sitting on the side of the bed? : Total Difficulty sitting down on and standing up from a chair with arms (e.g., wheelchair, bedside commode, etc,.)?: A Lot Help needed moving to and from a bed to chair (including a wheelchair)?: A Little Help needed walking in hospital room?: A Lot Help needed climbing 3-5 steps with a railing? : A  Lot 6 Click Score: 11    End of Session Equipment Utilized During Treatment: Gait belt Activity Tolerance: Patient tolerated treatment well Patient left: in bed;with call bell/phone within reach;with family/visitor present (in bed due to impending transfer to floor)   PT Visit Diagnosis: Unsteadiness on feet (R26.81);Muscle weakness (generalized) (M62.81);Difficulty in walking, not elsewhere classified (R26.2)     Time: 0947-0962 PT Time Calculation (min) (ACUTE ONLY): 41 min  Charges:  $Gait Training: 8-22 mins $Therapeutic Activity: 23-37 mins                    G CodesMagda Kiel, [place] [phone] 09/27/2016    Reginia Naas 09/27/2016, 4:58 PM

## 2016-09-27 NOTE — Progress Notes (Signed)
PROGRESS NOTE    Paul [name]  GHW:[id number] DOB: 1990-08-09 DOA: 09/11/2016 PCP: Janora Norlander, DO   Brief Narrative:  26 year old male with past medical history of diabetes, hypertension, hyperlipidemia, renal insufficiency and morbid obesity presented to the ER with complaints of confusion respiratory distress. He was noted to be hypoxic and tachypnea to the ER therefore placed on BiPAP. About a week prior to admission he was placed on Z-Pak by his PCP without any improvement. In the ER he was started on vancomycin and Zosyn. He was noted to have significantly elevated creatinine and was almost and uric therefore emergent dialysis was performed. He was also intubated due to no improvement for BiPAP. His cultures*far remain negative and he has been treated with antibiotics and completed the course of vancomycin and Zosyn on 09/22/2016. During his time in ICU actually self extubated but had to be reintubated on April 26 due to mucous plugging in the right main bronchus. While in the ICU he received CRRT later which was transitioned to intermittent dialysis on 09/16/2016. Initial echocardiogram on April 22 showed ejection fraction of 60-65 percent with moderate sized pericardial effusion but with dialysis it has improved and repeat echocardiogram shows small effusion compared to the one from the time of admission. Renal ultrasound shows medical disease.   Assessment & Plan:   Active Problems:   Acute hypoxemic respiratory failure (HCC)   Pressure injury of skin   Acute renal failure (ARF) (HCC)   Metabolic acidosis   Acute pulmonary edema (HCC)   Respiratory distress   HAP (hospital-acquired pneumonia)   VAP (ventilator-associated pneumonia) (HCC)   AKI (acute kidney injury) ([place])  Acute hypoxic/hypercapnic respiratory failure -Likely multifactorial in nature, pneumonia, pulmonary edema, untreated obstructive sleep apnea and mucus plugging -on CPAP qhs now, doesn't like using it much  due to the uncomfortably feeling of the flow. He is noncompliant with it.  -Supplemental O2 as needed -Completed his course of antibiotics  Pulmonary edema/moderate sized pericardial effusion -Repeat echocardiogram 5/3  shows improvement in smaller pericardial effusion -Suspect this could be from uremia. Continue hemodialysis as tolerated  Acute renal failure -Not requiring dialysis, Monday Wednesday Friday. HD plans and medication adjustment per Nephro.  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 History of anybody is mildly positive -s/p Insertion of Permacath and Left Brachial AVF on 5/7. Doing well. Follow up with Dr Oneida Alar in 6 weeks.   Anemia of chronic diseases Sickle cell trait -Continue monitor hemoglobin. No active signs of infection at this time  Elevated Phos, now on HD PO4 binders and Renavite when able to take po.   HTN -cont Norvasc 10mg  po daily and labetalol prn. Start Metoprolol PO today  Speech Rec: Dysphagia 3 diet. Nectar thick liquid. Still Moderate Aspiration Risk   DVT prophylaxis: hep subQ Code Status: Full  Family Communication:  None at bedside  Disposition Plan: Transfer to tele   Consultants:   nephro   Vascular   Procedures:   FEES 5/3  Bronchoscopy 4/26  CRRT 4/24  AV Fistula and Permacath placement 5/7  Antimicrobials:  4/22 Vanc/Zosyn x 1 4/22 Rocephin >> 4/23 4/22 Levaquin >> 4/25 4/27 Vanc/Zosyn >> 5/3    Subjective: No acute events overnight. Doesn't consistently use CPAP mask, states the flow is too much for him.   Objective: Vitals:   09/27/16 0417 09/27/16 0418 09/27/16 0810 09/27/16 1100  BP:  (!) 149/76 (!) 161/87 137/79  Pulse:  (!) 109 (!) 123 Marland Kitchen)  110  Resp:  11 20 15   Temp:  97.9 F (36.6 C) 97.9 F (36.6 C) 97.8 F (36.6 C)  TempSrc:  Oral Oral   SpO2:  100% 100% 100%  Weight: (!) 153 kg (337 lb 4.9 oz)     Height:        Intake/Output Summary (Last 24 hours) at  09/27/16 1333 Last data filed at 09/26/16 2035  Gross per 24 hour  Intake              850 ml  Output             2425 ml  Net            -1575 ml   Filed Weights   09/26/16 1657 09/26/16 2024 09/27/16 0417  Weight: (!) 151 kg (332 lb 14.3 oz) (!) 148.6 kg (327 lb 9.7 oz) (!) 153 kg (337 lb 4.9 oz)    Examination:  General exam: Appears calm and comfortable. obese Respiratory system: Clear to auscultation. Respiratory effort normal. Cardiovascular system: S1 & S2 heard, RRR. No JVD, murmurs, rubs, gallops or clicks. No pedal edema. R IJ HD catheter in place  Gastrointestinal system: Abdomen is nondistended, soft and nontender. No organomegaly or masses felt. Normal bowel sounds heard. Central nervous system: Alert. Follows some commands  Extremities: Symmetric 5 x 5 power. Skin: No rashes, lesions or ulcers Psychiatry: Judgement and insight appear normal. Mood & affect appropriate.     Data Reviewed:   CBC:  Recent Labs Lab 09/22/16 0500 09/23/16 0500 09/24/16 1214 09/25/16 0306 09/26/16 0214  WBC 17.7* 15.3* 12.4* 11.4* 10.8*  NEUTROABS 11.4* 9.6* 8.0* 7.5 6.3  HGB 10.3* 10.0* 11.5* 10.1* 9.7*  HCT 32.0* 30.4* 34.9* 29.8* 29.0*  MCV 78.4 77.6* 76.9* 75.6* 74.2*  PLT 267 248 253 231 850   Basic Metabolic Panel:  Recent Labs Lab 09/22/16 0500 09/23/16 0500 09/24/16 1214 09/25/16 0306 09/26/16 0214  NA 135 132* 128* 128* 129*  K 3.6 3.4* 3.6 4.2 3.8  CL 90* 87* 85* 85* 86*  CO2 24 21* 17* 15* 13*  GLUCOSE 118* 99 113* 82 87  BUN 64* 99* 96* 115* 139*  CREATININE 8.00* 11.04* 11.52* 13.21* 15.88*  CALCIUM 9.8 9.4 9.4 8.6* 8.4*  MG 2.6* 2.9* 2.8* 2.8* 2.8*  PHOS 7.7* 9.9* 10.9* 11.4* 13.2*   GFR: Estimated Creatinine Clearance: 11.2 mL/min (A) (by C-G formula based on SCr of 15.88 mg/dL (H)). Liver Function Tests:  Recent Labs Lab 09/22/16 0500 09/23/16 0500 09/24/16 1214 09/25/16 0306 09/26/16 0214  ALBUMIN 2.9* 2.7* 3.1* 2.9* 2.9*   No results  for input(s): LIPASE, AMYLASE in the last 168 hours. No results for input(s): AMMONIA in the last 168 hours. Coagulation Profile: No results for input(s): INR, PROTIME in the last 168 hours. Cardiac Enzymes: No results for input(s): CKTOTAL, CKMB, CKMBINDEX, TROPONINI in the last 168 hours. BNP (last 3 results) No results for input(s): PROBNP in the last 8760 hours. HbA1C: No results for input(s): HGBA1C in the last 72 hours. CBG:  Recent Labs Lab 09/26/16 2138 09/26/16 2328 09/27/16 0431 09/27/16 0808 09/27/16 1232  GLUCAP 113* 92 87 93 107*   Lipid Profile: No results for input(s): CHOL, HDL, LDLCALC, TRIG, CHOLHDL, LDLDIRECT in the last 72 hours. Thyroid Function Tests: No results for input(s): TSH, T4TOTAL, FREET4, T3FREE, THYROIDAB in the last 72 hours. Anemia Panel: No results for input(s): VITAMINB12, FOLATE, FERRITIN, TIBC, IRON, RETICCTPCT in the last 72 hours. Sepsis Labs:  No results for input(s): PROCALCITON, LATICACIDVEN in the last 168 hours.  Recent Results (from the past 240 hour(s))  Surgical PCR screen     Status: Abnormal   Collection Time: 09/26/16 12:16 AM  Result Value Ref Range Status   MRSA, PCR NEGATIVE NEGATIVE Final   Staphylococcus aureus POSITIVE (A) NEGATIVE Final    Comment:        The Xpert SA Assay (FDA approved for NASAL specimens in patients over 79 years of age), is one component of a comprehensive surveillance program.  Test performance has been validated by [facility] for patients greater than or equal to 34 year old. It is not intended to diagnose infection nor to guide or monitor treatment.          Radiology Studies: Dg Chest Port 1 View  Result Date: 09/26/2016 CLINICAL DATA:  Post dialysis catheter insertion, history diabetes mellitus, hypertension EXAM: PORTABLE CHEST 1 VIEW COMPARISON:  Portable exam 1618 hours compared to 09/20/2016 FINDINGS: RIGHT jugular central venous catheter with tip projecting over SVC.  Normal heart size, mediastinal contours, and pulmonary vascularity. Tips of lung apices excluded. Lungs otherwise clear. No pleural effusion or pneumothorax. Bones unremarkable. IMPRESSION: No gross evidence of pneumothorax following RIGHT jugular line insertion. Electronically Signed   By: Lavonia Dana M.D.   On: 09/26/2016 16:28   Dg Fluoro Guide Cv Line-no Report  Result Date: 09/26/2016 Fluoroscopy was utilized by the requesting physician.  No radiographic interpretation.        Scheduled Meds: . amLODipine  10 mg Per Tube Daily  . Chlorhexidine Gluconate Cloth  6 each Topical Daily  . darbepoetin (ARANESP) injection - DIALYSIS  100 mcg Intravenous Q Mon-HD  . heparin subcutaneous  5,000 Units Subcutaneous Q8H  . insulin aspart  1-3 Units Subcutaneous Q4H  . levalbuterol  1.25 mg Nebulization BID  . levothyroxine  75 mcg Intravenous Daily  . lidocaine  1 patch Transdermal Q24H  . mupirocin ointment  1 application Nasal BID  . sodium chloride flush  10-40 mL Intracatheter Q12H   Continuous Infusions: . sodium chloride 100 mL (09/24/16 0700)  . sodium chloride 250 mL (09/19/16 0843)  . dextrose       LOS: 16 days    Time spent: 35 mins     Ankit Arsenio Loader, MD [facility] Pager [phone]   If 7PM-7AM, please contact night-coverage [email or web address] Password [email or web address] 09/27/2016, 1:33 PM

## 2016-09-27 NOTE — Procedures (Signed)
Objective Swallowing Evaluation: Type of Study: FEES-Fiberoptic Endoscopic Evaluation of Swallow  Patient Details  Name: Paul [name] MRN: [id number] Date of Birth: [birth year]  Today's Date: 09/27/2016 Time: SLP Start Time (ACUTE ONLY): 0950-SLP Stop Time (ACUTE ONLY): 1020 SLP Time Calculation (min) (ACUTE ONLY): 30 min  Past Medical History:  Past Medical History:  Diagnosis Date  . Diabetes mellitus   . Hypertension   . Thyroid disease    Past Surgical History:  Past Surgical History:  Procedure Laterality Date  . AV FISTULA PLACEMENT Left 09/26/2016   Procedure: LEFT UPPER ARM ARTERIOVENOUS (AV) FISTULA CREATION;  Surgeon: Elam Dutch, MD;  Location: [place];  Service: Vascular;  Laterality: Left;  . 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 Admitted to ER via EMS with confusion and respiratory distress, CAP. Intubated 4/22, self extubated 4/26 but reintubated and extubated 5/2.   No Data Recorded   Assessment / Plan / Recommendation  CHL IP CLINICAL IMPRESSIONS 09/27/2016  Clinical Impression Pt positioned in a partially reclined position for FEES and could only accept spoon/straw sips. He demonstrates improvement in orpharyngeal dysphagia; vocal folds are irregular, but edema has resolved. Limitations continue to include increased oral and oropharyngeal secretions with mild oral dysphagia. Pt has oral residuals that mix with saliva and contribute to pharyngeal stasis post swallow. Laryngeal elevation and epiglottic deflection are slightly impaired, appearing to contribue to decreased laryngeal closure and bolus transit. Pt tolerated nectar thick liquids via straw well with mild residuals, but no penetration, though pt did persistently clear throat  throughout exam. Thin liquids were penetrated/aspirated during the swallow with sensation, but incomplete ejection of aspirate. Recommend pt consume nectar thick liquids and dys 3 (mech soft) solids with multiple effortful swallow to clear oral and pharyngeal residuals. Will follow for tolerance.   SLP Visit Diagnosis Dysphagia, oropharyngeal phase (R13.12)  Attention and concentration deficit following --  Frontal lobe and executive function deficit following --  Impact on safety and function Moderate aspiration risk      CHL IP TREATMENT RECOMMENDATION 09/27/2016  Treatment Recommendations Therapy as outlined in treatment plan below     Prognosis 09/27/2016  Prognosis for Safe Diet Advancement Good  Barriers to Reach Goals --  Barriers/Prognosis Comment --    CHL IP DIET RECOMMENDATION 09/27/2016  SLP Diet Recommendations Dysphagia 3 (Mech soft) solids;Nectar thick liquid  Liquid Administration via Cup;Straw  Medication Administration Whole meds with puree  Compensations Slow rate;Small sips/bites;Multiple dry swallows after each bite/sip  Postural Changes Remain semi-upright after after feeds/meals (Comment);Seated upright at 90 degrees      CHL IP OTHER RECOMMENDATIONS 09/27/2016  Recommended Consults --  Oral Care Recommendations Oral care BID  Other Recommendations Order thickener from pharmacy;Have oral suction available      CHL IP FOLLOW UP RECOMMENDATIONS 09/27/2016  Follow up Recommendations 24 hour supervision/assistance      CHL IP FREQUENCY AND DURATION 09/27/2016  Speech Therapy Frequency (ACUTE ONLY) min 2x/week  Treatment Duration 2 weeks           CHL IP ORAL PHASE 09/27/2016  Oral Phase Impaired  Oral - Pudding Teaspoon --  Oral - Pudding Cup --  Oral - Honey Teaspoon --  Oral - Honey Cup --  Oral - Nectar Teaspoon Lingual/palatal residue;Decreased bolus  cohesion  Oral - Nectar Cup --  Oral - Nectar Straw Lingual/palatal residue;Decreased bolus cohesion  Oral -  Thin Teaspoon --  Oral - Thin Cup --  Oral - Thin Straw Lingual/palatal residue;Decreased bolus cohesion  Oral - Puree Lingual/palatal residue;Decreased bolus cohesion  Oral - Mech Soft --  Oral - Regular Lingual/palatal residue;Decreased bolus cohesion  Oral - Multi-Consistency --  Oral - Pill --  Oral Phase - Comment --    CHL IP PHARYNGEAL PHASE 09/27/2016  Pharyngeal Phase Impaired  Pharyngeal- Pudding Teaspoon --  Pharyngeal --  Pharyngeal- Pudding Cup --  Pharyngeal --  Pharyngeal- Honey Teaspoon NT  Pharyngeal --  Pharyngeal- Honey Cup --  Pharyngeal --  Pharyngeal- Nectar Teaspoon Reduced airway/laryngeal closure;Reduced laryngeal elevation;Reduced tongue base retraction;Pharyngeal residue - valleculae;Lateral channel residue  Pharyngeal Material does not enter airway  Pharyngeal- Nectar Cup --  Pharyngeal --  Pharyngeal- Nectar Straw Reduced airway/laryngeal closure;Reduced laryngeal elevation;Reduced tongue base retraction;Pharyngeal residue - valleculae;Lateral channel residue  Pharyngeal --  Pharyngeal- Thin Teaspoon NT  Pharyngeal --  Pharyngeal- Thin Cup NT  Pharyngeal --  Pharyngeal- Thin Straw Reduced airway/laryngeal closure;Reduced laryngeal elevation;Reduced tongue base retraction;Pharyngeal residue - valleculae;Lateral channel residue;Penetration/Aspiration during swallow;Reduced epiglottic inversion;Delayed swallow initiation-vallecula;Trace aspiration  Pharyngeal Material enters airway, CONTACTS cords and not ejected out;Material enters airway, passes BELOW cords then ejected out;Material enters airway, remains ABOVE vocal cords and not ejected out;Material does not enter airway  Pharyngeal- Puree Reduced airway/laryngeal closure;Reduced laryngeal elevation;Reduced tongue base retraction;Pharyngeal residue - valleculae;Lateral channel residue  Pharyngeal Material does not enter airway  Pharyngeal- Mechanical Soft NT  Pharyngeal --  Pharyngeal- Regular Reduced  airway/laryngeal closure;Reduced laryngeal elevation;Reduced tongue base retraction;Pharyngeal residue - valleculae;Lateral channel residue  Pharyngeal --  Pharyngeal- Multi-consistency --  Pharyngeal --  Pharyngeal- Pill --  Pharyngeal --  Pharyngeal Comment --     CHL IP CERVICAL ESOPHAGEAL PHASE 09/22/2016  Cervical Esophageal Phase Impaired  Pudding Teaspoon --  Pudding Cup --  Honey Teaspoon --  Honey Cup --  Nectar Teaspoon --  Nectar Cup --  Nectar Straw --  Thin Teaspoon --  Thin Cup --  Thin Straw --  Puree --  Mechanical Soft --  Regular --  Multi-consistency --  Pill --  Cervical Esophageal Comment suspect decreased opening due to edema    No flowsheet data found.  Cristianna Cyr, Katherene Ponto 09/27/2016, 11:10 AM

## 2016-09-27 NOTE — Progress Notes (Addendum)
[facility]  Subjective  - Doing well s/p surgery yesterday.   Objective (!) 149/76 (!) 109 97.9 F (36.6 C) (Oral) 11 100%  Intake/Output Summary (Last 24 hours) at 09/27/16 0743 Last data filed at 09/26/16 2035  Gross per 24 hour  Intake              850 ml  Output             2435 ml  Net            -1585 ml    Left BC incision healing well with out hematoma.  Left hand grip 5/5, sensation grossly intact. Right IJ in place dressing clean and dry  Assessment/Planning: POD # 1 Insert Palindrome catheter 23 cm right internal jugular, Left Brachial Cephalic AV fistula  F/U with Dr. Oneida Alar in 6 weeks.  Laurence Slate [facility] 09/27/2016 7:43 AM -- Palpable thrill in fistula Incision intact Catheter working Will need follow up 4-6 weeks  [facility] Laboratory Lab Results:  Recent Labs  09/25/16 0306 09/26/16 0214  WBC 11.4* 10.8*  HGB 10.1* 9.7*  HCT 29.8* 29.0*  PLT 231 235   BMET  Recent Labs  09/25/16 0306 09/26/16 0214  NA 128* 129*  K 4.2 3.8  CL 85* 86*  CO2 15* 13*  GLUCOSE 82 87  BUN 115* 139*  CREATININE 13.21* 15.88*  CALCIUM 8.6* 8.4*    COAG Lab Results  Component Value Date   INR 1.44 09/11/2016   No results found for: PTT

## 2016-09-28 LAB — RENAL FUNCTION PANEL
Albumin: 2.9 g/dL — ABNORMAL LOW (ref 3.5–5.0)
Anion gap: 26 — ABNORMAL HIGH (ref 5–15)
BUN: 110 mg/dL — AB (ref 6–20)
CHLORIDE: 87 mmol/L — AB (ref 101–111)
CO2: 16 mmol/L — AB (ref 22–32)
Calcium: 8.2 mg/dL — ABNORMAL LOW (ref 8.9–10.3)
Creatinine, Ser: 15.02 mg/dL — ABNORMAL HIGH (ref 0.61–1.24)
GFR calc Af Amer: 5 mL/min — ABNORMAL LOW (ref >60)
GFR calc non Af Amer: 4 mL/min — ABNORMAL LOW (ref >60)
GLUCOSE: 137 mg/dL — AB (ref 65–99)
POTASSIUM: 3.2 mmol/L — AB (ref 3.5–5.1)
Phosphorus: 10.1 mg/dL — ABNORMAL HIGH (ref 2.5–4.6)
Sodium: 129 mmol/L — ABNORMAL LOW (ref 135–145)

## 2016-09-28 LAB — CBC
HEMATOCRIT: 30.2 % — AB (ref 39.0–52.0)
Hemoglobin: 10.1 g/dL — ABNORMAL LOW (ref 13.0–17.0)
MCH: 24.9 pg — AB (ref 26.0–34.0)
MCHC: 33.4 g/dL (ref 30.0–36.0)
MCV: 74.6 fL — AB (ref 78.0–100.0)
Platelets: 229 10*3/uL (ref 150–400)
RBC: 4.05 MIL/uL — ABNORMAL LOW (ref 4.22–5.81)
RDW: 17.4 % — AB (ref 11.5–15.5)
WBC: 9.2 10*3/uL (ref 4.0–10.5)

## 2016-09-28 LAB — GLUCOSE, CAPILLARY
GLUCOSE-CAPILLARY: 104 mg/dL — AB (ref 65–99)
GLUCOSE-CAPILLARY: 106 mg/dL — AB (ref 65–99)
Glucose-Capillary: 108 mg/dL — ABNORMAL HIGH (ref 65–99)
Glucose-Capillary: 117 mg/dL — ABNORMAL HIGH (ref 65–99)
Glucose-Capillary: 185 mg/dL — ABNORMAL HIGH (ref 65–99)
Glucose-Capillary: 121 mg/dL — ABNORMAL HIGH (ref 65–99)

## 2016-09-28 MED ORDER — AMLODIPINE BESYLATE 5 MG PO TABS: 5.0000 mg | ORAL_TABLET | Freq: Every day | ORAL | 0 refills | Status: DC

## 2016-09-28 MED ORDER — METOPROLOL TARTRATE 25 MG PO TABS: 12.5000 mg | ORAL_TABLET | Freq: Two times a day (BID) | ORAL | 0 refills | Status: DC

## 2016-09-28 MED ORDER — DOCUSATE SODIUM 50 MG/5ML PO LIQD: 100.0000 mg | Freq: Two times a day (BID) | ORAL | 0 refills | Status: DC | PRN

## 2016-09-28 MED ORDER — RENA-VITE PO TABS: 1.0000 | ORAL_TABLET | Freq: Every day | ORAL | 0 refills | Status: DC

## 2016-09-28 MED ORDER — DARBEPOETIN ALFA 100 MCG/0.5ML IJ SOSY: 100.0000 ug | PREFILLED_SYRINGE | INTRAMUSCULAR | Status: DC

## 2016-09-28 MED ORDER — AMLODIPINE BESYLATE 5 MG PO TABS: 5.0000 mg | ORAL_TABLET | Freq: Every day | ORAL | Status: DC

## 2016-09-28 MED ORDER — POTASSIUM CHLORIDE CRYS ER 20 MEQ PO TBCR
40.0000 meq | EXTENDED_RELEASE_TABLET | Freq: Once | ORAL | Status: AC
  Administered 2016-09-28: 40 meq via ORAL
  Filled 2016-09-28: qty 2

## 2016-09-28 MED ORDER — OXYCODONE HCL 5 MG PO TABS
5.0000 mg | ORAL_TABLET | Freq: Four times a day (QID) | ORAL | Status: AC | PRN
Start: 2016-09-28 — End: 2016-09-28
  Administered 2016-09-28 (×2): 5 mg via ORAL
  Filled 2016-09-28 (×2): qty 1

## 2016-09-28 MED ORDER — ACETAMINOPHEN 325 MG PO TABS: 650.0000 mg | ORAL_TABLET | Freq: Four times a day (QID) | ORAL | 0 refills | Status: DC | PRN

## 2016-09-28 MED ORDER — RENA-VITE PO TABS
1.0000 | ORAL_TABLET | Freq: Every day | ORAL | Status: DC
  Filled 2016-09-28: qty 1

## 2016-09-28 MED ORDER — CALCIUM ACETATE (PHOS BINDER) 667 MG PO CAPS
1334.0000 mg | ORAL_CAPSULE | Freq: Three times a day (TID) | ORAL | Status: DC
  Administered 2016-09-28 – 2016-09-29 (×2): 1334 mg via ORAL
  Filled 2016-09-28 (×2): qty 2

## 2016-09-28 MED ORDER — RESOURCE THICKENUP CLEAR PO POWD: 1.0000 | ORAL | 0 refills | Status: DC | PRN

## 2016-09-28 MED ORDER — CALCIUM ACETATE (PHOS BINDER) 667 MG PO CAPS: 1334.0000 mg | ORAL_CAPSULE | Freq: Three times a day (TID) | ORAL | 0 refills | Status: DC

## 2016-09-28 NOTE — Progress Notes (Signed)
S:  No new CO.  Walking a little.  UO minimal O:BP 123/64   Pulse (!) 114   Temp 98.8 F (37.1 C)   Resp 17   Ht 6\' 2"  (1.88 m)   Wt (!) 146 kg (321 lb 14 oz)   SpO2 98%   BMI 41.33 kg/m   Intake/Output Summary (Last 24 hours) at 09/28/16 0853 Last data filed at 09/28/16 0200  Gross per 24 hour  Intake              480 ml  Output                0 ml  Net              480 ml   Weight change: -5 kg (-11 lb 0.4 oz) QAS:TMHDQ and alert CVS:Sl tachy, regular Resp: Clear Abd:+ BS NTND Ext:No edema  LUA AVF NEURO:CNI Ox3, no asterixis Rt IJ PC   . amLODipine  10 mg Per Tube Daily  . Chlorhexidine Gluconate Cloth  6 each Topical Daily  . darbepoetin (ARANESP) injection - DIALYSIS  100 mcg Intravenous Q Mon-HD  . heparin subcutaneous  5,000 Units Subcutaneous Q8H  . insulin aspart  1-3 Units Subcutaneous Q4H  . levalbuterol  1.25 mg Nebulization BID  . levothyroxine  75 mcg Intravenous Daily  . lidocaine  1 patch Transdermal Q24H  . metoprolol tartrate  12.5 mg Oral BID  . mupirocin ointment  1 application Nasal BID  . sodium chloride flush  10-40 mL Intracatheter Q12H   Dg Chest Port 1 View  Result Date: 09/26/2016 CLINICAL DATA:  Post dialysis catheter insertion, history diabetes mellitus, hypertension EXAM: PORTABLE CHEST 1 VIEW COMPARISON:  Portable exam 1618 hours compared to 09/20/2016 FINDINGS: RIGHT jugular central venous catheter with tip projecting over SVC. Normal heart size, mediastinal contours, and pulmonary vascularity. Tips of lung apices excluded. Lungs otherwise clear. No pleural effusion or pneumothorax. Bones unremarkable. IMPRESSION: No gross evidence of pneumothorax following RIGHT jugular line insertion. Electronically Signed   By: Lavonia Dana M.D.   On: 09/26/2016 16:28   Dg Fluoro Guide Cv Line-no Report  Result Date: 09/26/2016 Fluoroscopy was utilized by the requesting physician.  No radiographic interpretation.   BMET    Component Value Date/Time   NA 129 (L) 09/26/2016 0214   K 3.8 09/26/2016 0214   CL 86 (L) 09/26/2016 0214   CO2 13 (L) 09/26/2016 0214   GLUCOSE 87 09/26/2016 0214   BUN 139 (H) 09/26/2016 0214   CREATININE 15.88 (H) 09/26/2016 0214   CREATININE 1.85 (H) 11/28/2014 1153   CALCIUM 8.4 (L) 09/26/2016 0214   GFRNONAA 4 (L) 09/26/2016 0214   GFRNONAA 51 (L) 11/28/2014 1153   GFRAA 4 (L) 09/26/2016 0214   GFRAA 58 (L) 11/28/2014 1153   CBC    Component Value Date/Time   WBC 10.8 (H) 09/26/2016 0214   RBC 3.91 (L) 09/26/2016 0214   HGB 9.7 (L) 09/26/2016 0214   HCT 29.0 (L) 09/26/2016 0214   PLT 235 09/26/2016 0214   MCV 74.2 (L) 09/26/2016 0214   MCH 24.8 (L) 09/26/2016 0214   MCHC 33.4 09/26/2016 0214   RDW 17.3 (H) 09/26/2016 0214   LYMPHSABS 2.8 09/26/2016 0214   MONOABS 1.1 (H) 09/26/2016 0214   EOSABS 0.5 09/26/2016 0214   BASOSABS 0.1 09/26/2016 0214     Assessment:  1. New ESRD.  Last Scr available from 7/16  was 1.85.  Has outpt spot at  [facility] TTS 2nd shift 2. Sepsis/CAP 3. Anemia on ESA 4. 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 Mod pericardial effusion 6. DM 7. HTN  Plan: 1.  HD today and then will do again tomorrow to get on TTS schedule 2. BP a little soft, will decrease to 5mg  q d 3.  Cont to work with PT.  Can go from renal standpoint when ambulation improves 4. Start PO4 binder 5. Start renavite   Eban Weick T

## 2016-09-28 NOTE — Discharge Summary (Addendum)
Physician Discharge Summary  Paul [name] FTD:[id number] DOB: Oct 08, 1990 DOA: 09/11/2016  PCP: Janora Norlander, DO  Admit date: 09/11/2016 Discharge date: 09/28/2016  Recommendations for Outpatient Follow-up:  1. Continue HD per renal schedule 2. Please use insulin 25 units twice a day instead of full 50 units twice a day if you are not eating a full meal. Check your CBG as per prior to the admission.  Before a meal (preprandial plasma glucose): 70-130   1-2 hours after beginning of the meal (Postprandial plasma glucose): Less than 180    See more at: www.diabetes.org/living-with-diabetes/treatment-and-care/blood-glucose-control/checking-your-blood-glucose.htm  Discharge Diagnoses:  Active Problems:   Acute hypoxemic respiratory failure (HCC)   Pressure injury of skin   Acute renal failure (ARF) (HCC)   Metabolic acidosis   Acute pulmonary edema (HCC)   Respiratory distress   HAP (hospital-acquired pneumonia)   VAP (ventilator-associated pneumonia) (HCC)   AKI (acute kidney injury) ([place])    Discharge Condition: stable   Diet recommendation: as tolerated; dysphagia 3, nectar thick   History of present illness:   Per brief narrative 09/27/2016 "26 year old male with past medical history of diabetes, hypertension, hyperlipidemia, renal insufficiency and morbid obesity presented to the ER with complaints of confusion respiratory distress. He was noted to be hypoxic and tachypnea to the ER therefore placed on BiPAP. About a week prior to admission he was placed on Z-Pak by his PCP without any improvement. In the ER he was started on vancomycin and Zosyn. He was noted to have significantly elevated creatinine and was almost and uric therefore emergent dialysis was performed. He was also intubated due to no improvement for BiPAP. His cultures*far remain negative and he has been treated with antibiotics and completed the course of vancomycin and Zosyn on 09/22/2016. During his time in ICU  actually self extubated but had to be reintubated on April 26 due to mucous plugging in the right main bronchus. While in the ICU he received CRRT later which was transitioned to intermittent dialysis on 09/16/2016. Initial echocardiogram on April 22 showed ejection fraction of 60-65 percent with moderate sized pericardial effusion but with dialysis it has improved and repeat echocardiogram shows small effusion compared to the one from the time of admission. 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 Oneida Alar in 6 weeks   Anemia of CKD - Hgb stable   Elevated phosphate level  - Continue phosphate binders  Essential hypertension - Continue Norvasc  Hypothyroidism - Continue synthroid    DVT prophylaxis: Hep subQ Code Status: Full  Family Communication: no family at the bedside this am   Consultants:   Nephrology  Vascular surgery    Procedures:   FEES 5/3  Bronchoscopy 4/26  CRRT 4/24  AVF and permacath placement 5/7   Signed:  Leisa Lenz, MD  [facility] 09/28/2016, 3:52 PM  Pager #: [phone]  Time spent in minutes: less than 30 minutes   Discharge Exam: Vitals:   09/28/16 1345 09/28/16 1350  BP: Marland Kitchen)  111/56 (!) 126/50  Pulse: (!) 110 (!) 101  Resp:  20  Temp:  98.2 F (36.8 C)   Vitals:   09/28/16 1245 09/28/16 1315 09/28/16 1345 09/28/16 1350  BP: 118/60 (!) 113/59 (!) 111/56 (!) 126/50  Pulse: 99 (!) 110 (!) 110 (!) 101  Resp:    20   Temp:    98.2 F (36.8 C)  TempSrc:    Oral  SpO2:   98% 98%  Weight:      Height:        General: Pt is alert, follows commands appropriately, not in acute distress Cardiovascular: Regular rate and rhythm, S1/S2 +, no murmurs Respiratory: Clear to auscultation bilaterally, no wheezing, no crackles, no rhonchi Abdominal: Soft, non tender, non distended, bowel sounds +, no guarding Extremities: no edema, no cyanosis, pulses palpable bilaterally DP and PT Neuro: Grossly nonfocal  Discharge Instructions  Discharge Instructions    Call MD for:  persistant nausea and vomiting    Complete by:  As directed    Call MD for:  redness, tenderness, or signs of infection (pain, swelling, redness, odor or green/yellow discharge around incision site)    Complete by:  As directed    Diet - low sodium heart healthy    Complete by:  As directed    Discharge instructions    Complete by:  As directed    Please use insulin 25 units twice a day instead of full 50 units twice a day if you are not eating a full meal. Check your CBG as per prior to the admission.  Before a meal (preprandial plasma glucose): 70-130   1-2 hours after beginning of the meal (Postprandial plasma glucose): Less than 180    See more at: www.diabetes.org/living-with-diabetes/treatment-and-care/blood-glucose-control/checking-your-blood-glucose.htm   Increase activity slowly    Complete by:  As directed      Allergies as of 09/28/2016      Reactions   Hydralazine Hcl Other (See Comments)   Drug-induced Lupus      Medication List    STOP taking these medications   ciprofloxacin 500 MG tablet Commonly known as:  CIPRO     TAKE these medications   ACCU-CHEK FASTCLIX LANCETS Misc 1 each by Does not apply route 4 (four) times daily -  before meals and at bedtime.   ACCU-CHEK NANO SMARTVIEW w/Device Kit 1 Device by Does not apply route 4 (four) times daily -  before meals and at bedtime.   acetaminophen 325 MG  tablet Commonly known as:  TYLENOL Take 2 tablets (650 mg total) by mouth every 6 (six) hours as needed for mild pain or headache.   amLODipine 5 MG tablet Commonly known as:  NORVASC Take 1 tablet (5 mg total) by mouth daily. Start taking on:  09/29/2016 What changed:  medication strength  how much to take   calcium acetate 667 MG capsule Commonly known as:  PHOSLO Take 2 capsules (1,334 mg total) by mouth 3 (three) times daily with meals.   docusate 50 MG/5ML liquid Commonly known as:  COLACE Place 10 mLs (100 mg total) into feeding tube 2 (two) times daily as needed for mild constipation.   glucose blood test strip Use as instructed   insulin glargine 100 UNIT/ML injection Commonly known as:  LANTUS Inject 50 u of insulin into the skin BID What changed:  how much to take  how to take this  when to take this  additional instructions   levothyroxine 150 MCG tablet Commonly  known as:  SYNTHROID, LEVOTHROID Take 1 tablet (150 mcg total) by mouth daily. Needs office visit   metoprolol tartrate 25 MG tablet Commonly known as:  LOPRESSOR Take 0.5 tablets (12.5 mg total) by mouth 2 (two) times daily.   multivitamin Tabs tablet Take 1 tablet by mouth at bedtime.   [place] Take 1 Container by mouth as needed.   rosuvastatin 20 MG tablet Commonly known as:  CRESTOR Take 1 tablet (20 mg total) by mouth daily.      Follow-up Information    Elam Dutch, MD Follow up in 6 week(s).   Specialties:  Vascular Surgery, Cardiology Why:  Office will call you to arrange your appt (sent) Contact information: [address] [place] [address] [place], Bill[place], DO. Schedule an appointment as soon as possible for a visit in 1 week(s).   Specialty:  Family Medicine Contact information: [address] [place] [address] [phone]            The results of significant diagnostics from this  hospitalization (including imaging, microbiology, ancillary and laboratory) are listed below for reference.    Significant Diagnostic Studies: Ct Abdomen Pelvis Wo Contrast  Result Date: 09/20/2016 CLINICAL DATA:  Fever EXAM: CT CHEST, ABDOMEN AND PELVIS WITHOUT CONTRAST TECHNIQUE: Multidetector CT imaging of the chest, abdomen and pelvis was performed following the standard protocol without IV contrast. COMPARISON:  Chest x-ray 09/20/2016 FINDINGS: CT CHEST FINDINGS Cardiovascular: Limited without intravenous contrast. Non aneurysmal aorta. Right-sided central venous catheter tip terminates in the distal SVC. Left-sided central venous catheter tip terminates in the left brachiocephalic vein. Borderline to mild cardiomegaly. Moderate pericardial effusion, thickest along the left posterior heart where the effusion measures 2.5 cm in maximum thickness. Mediastinum/Nodes: Endotracheal tube tip in the mid trachea. Nonspecific subcentimeter mediastinal lymph nodes. Esophageal tube present and the tip terminates in the distal stomach. Heterogenous nodular appearing thyroid gland Lungs/Pleura: No pneumothorax or significant pleural effusion. Patchy consolidations within the bilateral lower lobes. Musculoskeletal: No chest wall mass or suspicious bone lesions identified. CT ABDOMEN PELVIS FINDINGS Hepatobiliary: No focal hepatic abnormality. No calcified gallstones. Possible small amount of sludge in the gallbladder. No biliary dilatation. Pancreas: Unremarkable. No pancreatic ductal dilatation or surrounding inflammatory changes. Spleen: Enlarged spleen, measuring up to 16 cm. Adrenals/Urinary Tract: Adrenal glands are within normal limits. Vague low-density lesions in the kidneys, measuring up to 1.2 cm on the right, likely cysts. No hydronephrosis. The bladder contains a Foley catheter . Stomach/Bowel: The stomach is nonenlarged. No dilated small bowel. Diffuse fluid-filled colon. Normal appendix. Vascular/Lymphatic:  Nonspecific retroperitoneal subcentimeter nodes. Non aneurysmal aorta Reproductive: Prostate is unremarkable. Other: No free air or free fluid. Musculoskeletal: No acute or significant osseous findings. IMPRESSION: 1. Patchy consolidations within the bilateral lower lobes is suspicious for pneumonia. 2. Moderate pericardial effusion measuring up to 2.5 cm in thickness along the left posterior cardiac border. 3. Diffuse fluid-filled colon can be seen with diarrheal illness. No evidence for wall thickening or bowel obstruction. Normal appendix. 4. Enlarged spleen 5. Heterogenous nodular thyroid gland. Electronically Signed   By: Donavan Foil M.D.   On: 09/20/2016 21:21   Ct Chest Wo Contrast  Result Date: 09/20/2016 CLINICAL DATA:  Fever EXAM: CT CHEST, ABDOMEN AND PELVIS WITHOUT CONTRAST TECHNIQUE: Multidetector CT imaging of the chest, abdomen and pelvis was performed following the standard protocol without IV contrast. COMPARISON:  Chest x-ray 09/20/2016 FINDINGS: CT CHEST FINDINGS Cardiovascular:  Limited without intravenous contrast. Non aneurysmal aorta. Right-sided central venous catheter tip terminates in the distal SVC. Left-sided central venous catheter tip terminates in the left brachiocephalic vein. Borderline to mild cardiomegaly. Moderate pericardial effusion, thickest along the left posterior heart where the effusion measures 2.5 cm in maximum thickness. Mediastinum/Nodes: Endotracheal tube tip in the mid trachea. Nonspecific subcentimeter mediastinal lymph nodes. Esophageal tube present and the tip terminates in the distal stomach. Heterogenous nodular appearing thyroid gland Lungs/Pleura: No pneumothorax or significant pleural effusion. Patchy consolidations within the bilateral lower lobes. Musculoskeletal: No chest wall mass or suspicious bone lesions identified. CT ABDOMEN PELVIS FINDINGS Hepatobiliary: No focal hepatic abnormality. No calcified gallstones. Possible small amount of sludge in the  gallbladder. No biliary dilatation. Pancreas: Unremarkable. No pancreatic ductal dilatation or surrounding inflammatory changes. Spleen: Enlarged spleen, measuring up to 16 cm. Adrenals/Urinary Tract: Adrenal glands are within normal limits. Vague low-density lesions in the kidneys, measuring up to 1.2 cm on the right, likely cysts. No hydronephrosis. The bladder contains a Foley catheter . Stomach/Bowel: The stomach is nonenlarged. No dilated small bowel. Diffuse fluid-filled colon. Normal appendix. Vascular/Lymphatic: Nonspecific retroperitoneal subcentimeter nodes. Non aneurysmal aorta Reproductive: Prostate is unremarkable. Other: No free air or free fluid. Musculoskeletal: No acute or significant osseous findings. IMPRESSION: 1. Patchy consolidations within the bilateral lower lobes is suspicious for pneumonia. 2. Moderate pericardial effusion measuring up to 2.5 cm in thickness along the left posterior cardiac border. 3. Diffuse fluid-filled colon can be seen with diarrheal illness. No evidence for wall thickening or bowel obstruction. Normal appendix. 4. Enlarged spleen 5. Heterogenous nodular thyroid gland. Electronically Signed   By: Donavan Foil M.D.   On: 09/20/2016 21:21   US Renal  Result Date: 09/12/2016 CLINICAL DATA:  26 year old male with acute renal failure. EXAM: RENAL / URINARY TRACT ULTRASOUND COMPLETE COMPARISON:  None. FINDINGS: Evaluation is limited due to patient's body habitus. Right Kidney: Length: 12.3 cm. The right kidney is echogenic. No hydronephrosis or shadowing stone. Left Kidney: Length: 10.9 cm. The left kidney is echogenic. No hydronephrosis or shadowing stone. Bladder: The urinary bladder is collapsed and not well visualized. IMPRESSION: Echogenic kidneys consistent with medical renal disease. Correlation with clinical exam and renal function recommended. Electronically Signed   By: Anner Crete M.D.   On: 09/12/2016 05:01   Dg Chest Port 1 View  Result Date:  09/26/2016 CLINICAL DATA:  Post dialysis catheter insertion, history diabetes mellitus, hypertension EXAM: PORTABLE CHEST 1 VIEW COMPARISON:  Portable exam 1618 hours compared to 09/20/2016 FINDINGS: RIGHT jugular central venous catheter with tip projecting over SVC. Normal heart size, mediastinal contours, and pulmonary vascularity. Tips of lung apices excluded. Lungs otherwise clear. No pleural effusion or pneumothorax. Bones unremarkable. IMPRESSION: No gross evidence of pneumothorax following RIGHT jugular line insertion. Electronically Signed   By: Lavonia Dana M.D.   On: 09/26/2016 16:28   Dg Chest Port 1 View  Result Date: 09/20/2016 CLINICAL DATA:  Respiratory failure, intubated patient EXAM: PORTABLE CHEST 1 VIEW COMPARISON:  Portable chest x-ray of September 19, 2016 FINDINGS: The lungs are better inflated today. There is persistent increased density at both lung bases and in the right perihilar region. The cardiac silhouette remains enlarged. There is no pneumothorax nor definite pleural effusion. The endotracheal tube tip lies approximately 5.5 cm above the carina. The esophagogastric tube tip projects below the inferior margin of the image. The left internal jugular venous catheter terminates at the junction of the left internal jugular vein with the  left subclavian vein. IMPRESSION: Slight improved aeration today in part due to patient positioning. Persistent bibasilar atelectasis or pneumonia with right perihilar atelectasis or pneumonia. Stable enlargement cardiac silhouette mild interstitial edema. The support devices are in reasonable position. Electronically Signed   By: David  Martinique M.D.   On: 09/20/2016 07:06   Dg Chest Port 1 View  Result Date: 09/18/2016 CLINICAL DATA:  Acute respiratory failure. Endotracheal tube present. EXAM: PORTABLE CHEST 1 VIEW COMPARISON:  09/17/2016 FINDINGS: Support lines and tubes remain in stable position. Cardiomegaly stable. Bilateral pulmonary airspace disease  and low lung volumes show no significant change. There is persistent collapse or consolidation in the left retrocardiac lung base. No pneumothorax visualized. IMPRESSION: No significant interval change compared to prior exam. Electronically Signed   By: Earle Gell M.D.   On: 09/18/2016 07:22   Dg Chest Port 1 View  Result Date: 09/17/2016 CLINICAL DATA:  Acute respiratory failure EXAM: PORTABLE CHEST 1 VIEW COMPARISON:  09/16/2016 FINDINGS: Endotracheal tube with the tip 3 cm above the carina. Nasogastric tube coursing below the diaphragm. Right jugular central venous catheter with the tip projecting over the cavoatrial junction. Bilateral diffuse interstitial thickening. Possible trace bilateral pleural effusions. No pneumothorax. Stable cardiomegaly. IMPRESSION: 1. Support lines and tubing in satisfactory position. 2. Mild CHF. Electronically Signed   By: Kathreen Devoid   On: 09/17/2016 08:08   Dg Chest Port 1 View  Result Date: 09/16/2016 CLINICAL DATA:  Hypoxia EXAM: PORTABLE CHEST 1 VIEW COMPARISON:  September 15, 2016 FINDINGS: Endotracheal tube tip is 3.3 cm above the carina. Right jugular catheter tip is in the superior vena cava near the cavoatrial junction. Left jugular catheter tip is in the left innominate vein. Nasogastric tube tip and side port are below the diaphragm. No pneumothorax. There remains consolidation in portions of the right lower lobe, essentially stable. There is a small right pleural effusion. Left lung is clear. There is cardiomegaly with pulmonary venous hypertension. No adenopathy. No evident bone lesions. IMPRESSION: Tube and catheter positions as described without evident pneumothorax. Persistent consolidation throughout portions of the right lower lobe with small right pleural effusion. Left lung clear. Evidence of underlying pulmonary vascular congestion. Electronically Signed   By: Lowella Grip III M.D.   On: 09/16/2016 07:12   Dg Chest Port 1 View  Result Date:  09/15/2016 CLINICAL DATA:  26 year old male currently intubated EXAM: PORTABLE CHEST 1 VIEW COMPARISON:  Prior chest x-ray 09/15/2016 at 15:26 p.m. FINDINGS: The patient is intubated. The tip of the endotracheal tube is 3.4 cm above the carina. Left IJ approach central venous catheter with the tip overlying the left innominate vein. Right IJ approach non tunneled hemodialysis catheter with the tip at the level of the mid SVC. Incompletely imaged gastric tube. The tip lies below the diaphragm, likely within the stomach. Marked cardiomegaly. Improved aeration of the right lung likely secondary to resolution of mucous plugging. There is still some persistent right lower lobe atelectasis. The left lung remains relatively clear. No evidence of pneumothorax. No acute osseous abnormality. IMPRESSION: 1. Improving aeration in the right lung likely secondary to resolution of right mainstem bronchus mucous plugging. There is still persistent atelectasis and airspace opacity throughout the right mid and the lower lobe. 2. Stable support apparatus. Electronically Signed   By: Jacqulynn Cadet M.D.   On: 09/15/2016 17:14   Dg Chest Port 1 View  Result Date: 09/15/2016 CLINICAL DATA:  Hypoxia EXAM: PORTABLE CHEST 1 VIEW COMPARISON:  Study obtained  earlier in the day FINDINGS: Endotracheal tube tip is 4.3 cm above the carina. Central catheter tip is in the superior vena cava. Nasogastric tube tip and side port are below the diaphragm. No pneumothorax. There is abrupt termination of the proximal right main bronchus with diffuse opacification of the right lung. Left lung is clear. Heart size is normal. No adenopathy is appreciable in areas that can be assessed. No bone lesions are appreciable. IMPRESSION: Tube and catheter positions as described without pneumothorax. Abrupt termination of the proximal right main bronchus with diffuse opacification on the right. Suspect mucous plug in the proximal right main bronchus. There may  also be a degree of pleural effusion and consolidation on the right. Left lung clear. These results will be called to the ordering clinician or representative by the Radiologist Assistant, and communication documented in the PACS or zVision Dashboard. Electronically Signed   By: Lowella Grip III M.D.   On: 09/15/2016 15:43   Dg Chest Port 1 View  Result Date: 09/15/2016 CLINICAL DATA:  Acute respiratory failure EXAM: PORTABLE CHEST 1 VIEW COMPARISON:  Yesterday FINDINGS: Endotracheal tube tip 2 cm above the carina. High position is improved from yesterday. Dialysis catheter on the right and central line on the left, tips in unchanged position. Orogastric tube at least reaches the stomach. Unchanged cardiopericardial enlargement. Mildly improved aeration. There is still extensive lung opacity and low lung volumes. IMPRESSION: 1. Mildly high endotracheal tube.  Consider advancement by 2 cm. 2. Other hardware is in stable position. 3. Questionable improvement in aeration/edema. Lung volumes remain low and there is still extensive lung opacity. 4. Pericardial effusion. Cardiopericardial enlargement is unchanged. Electronically Signed   By: Monte Fantasia M.D.   On: 09/15/2016 07:36   Dg Chest Port 1 View  Result Date: 09/14/2016 CLINICAL DATA:  Respiratory failure. EXAM: PORTABLE CHEST 1 VIEW COMPARISON:  09/13/2016. FINDINGS: Endotracheal tube, NG tube, bilateral IJ lines in stable position. Persistent severe cardiomegaly. Low lung volumes. Persistent bibasilar infiltrates and/or edema again noted. Small bilateral pleural effusions can't be excluded. IMPRESSION: 1. Lines and tubes in stable position. 2. Persistent severe cardiomegaly. Persistent bibasilar pulmonary infiltrates/edema again noted without change. Small bilateral pleural effusions cannot be excluded. 3. Low lung volumes. Electronically Signed   By: Marcello Moores  Register   On: 09/14/2016 06:46   Dg Chest Port 1 View  Result Date:  09/13/2016 CLINICAL DATA:  Acute respiratory failure. EXAM: PORTABLE CHEST 1 VIEW COMPARISON:  09/11/2016 FINDINGS: Endotracheal tube tip projects 4.4 cm above the carina. Right internal jugular dual-lumen central venous catheter has its tip in the lower superior vena cava left internal jugular central venous line has its tip in the central left brachiocephalic vein. Nasal/orogastric tube passes at least the distal esophagus, likely into the stomach by below the included field of view. Support apparatus is stable. Cardiac silhouette is mildly enlarged. No mediastinal or hilar masses. There is opacity at the lung bases increased from prior study. This is likely atelectasis. Pneumonia is possible. Asymmetric pulmonary edema is possible. IMPRESSION: 1. Support apparatus is stable and positioned as detailed above. 2. Increased opacity at the lung bases when compared to the most recent prior exam. This is most likely atelectasis. Pneumonia or asymmetric edema are possible but thought less likely. Electronically Signed   By: Lajean Manes M.D.   On: 09/13/2016 10:14   Dg Chest Port 1 View  Result Date: 09/11/2016 CLINICAL DATA:  New central line placement. EXAM: PORTABLE CHEST 1 VIEW COMPARISON:  09/11/2016 at 8:21 a.m. FINDINGS: New left internal jugular central venous line has its tip projecting in the left brachiocephalic vein. It does not enter the superior vena cava. Endotracheal tube, right internal jugular dual-lumen central venous line and nasogastric tube are stable in well positioned. There has been continued improvement in pulmonary edema, which is now mostly resolved. Cardiac enlargement is stable. No pneumothorax. IMPRESSION: 1. New left internal jugular central venous line catheter tip projects in the left brachiocephalic vein. No pneumothorax. 2. Continued improvement in lung aeration with near complete resolution of the pulmonary edema. 3. Previously described support apparatus is stable and well  positioned. Electronically Signed   By: Lajean Manes M.D.   On: 09/11/2016 15:19   Dg Chest Portable 1 View  Result Date: 09/11/2016 CLINICAL DATA:  ET tube placementCentral line placement EXAM: PORTABLE CHEST 1 VIEW COMPARISON:  09/11/2016 at 5:43 a.m. FINDINGS: New endotracheal tube tip projects 4.2 cm above the chronic. Nasal/ orogastric tube passes below the diaphragm well into the stomach and below the included field of view. Dual-lumen right internal jugular central venous line tip projects in the lower superior vena cava approximately 2 cm above the caval atrial junction. Vascular congestion and bilateral airspace lung opacities have improved consistent with improved pulmonary edema. No pneumothorax. IMPRESSION: 1. Improved pulmonary edema. 2. Support apparatus well positioned as detailed above. No pneumothorax. Electronically Signed   By: Lajean Manes M.D.   On: 09/11/2016 08:31   Dg Chest Portable 1 View  Result Date: 09/11/2016 CLINICAL DATA:  Acute onset of severe shortness of breath. Initial encounter. EXAM: PORTABLE CHEST 1 VIEW COMPARISON:  None. FINDINGS: The lungs are well-aerated. Vascular congestion is noted. Diffusely increased interstitial markings raise concern for pulmonary edema. No definite pleural effusion or pneumothorax is seen. The cardiomediastinal silhouette is enlarged. No acute osseous abnormalities are seen. IMPRESSION: Vascular congestion and cardiomegaly. Diffusely increased interstitial markings raise concern for pulmonary edema. Electronically Signed   By: Garald Balding M.D.   On: 09/11/2016 05:56   Dg Chest Port 1v Same Day  Result Date: 09/19/2016 CLINICAL DATA:  Acute respiratory failure, pulmonary edema, diabetes. EXAM: PORTABLE CHEST 1 VIEW COMPARISON:  Portable chest x-ray of September 18, 2016 FINDINGS: The radiograph is obtained in a lordotic position. There has been interval development of subsegmental atelectasis in the inferior aspect of the right upper  lobe. There is persistent increased density in the retrocardiac region on the left. The cardiac silhouette remains enlarged. The pulmonary vascularity is slightly less conspicuous today. There is no pleural effusion. The esophagogastric tube tip projects below the inferior margin of the image. The endotracheal tube tip lies just below the inferior margin of the clavicular heads. IMPRESSION: Interval development of subsegmental atelectasis in the right upper lobe. Persistent left basilar atelectasis or pneumonia. Stable cardiomegaly with only mild pulmonary interstitial edema. Electronically Signed   By: David  Martinique M.D.   On: 09/19/2016 08:31   Dg Fluoro Guide Cv Line-no Report  Result Date: 09/26/2016 Fluoroscopy was utilized by the requesting physician.  No radiographic interpretation.    Microbiology: Recent Results (from the past 240 hour(s))  Surgical PCR screen     Status: Abnormal   Collection Time: 09/26/16 12:16 AM  Result Value Ref Range Status   MRSA, PCR NEGATIVE NEGATIVE Final   Staphylococcus aureus POSITIVE (A) NEGATIVE Final    Comment:        The Xpert SA Assay (FDA approved for NASAL specimens in patients over 21 years  of age), is one component of a comprehensive surveillance program.  Test performance has been validated by [facility] for patients greater than or equal to 72 year old. It is not intended to diagnose infection nor to guide or monitor treatment.      Labs: Basic Metabolic Panel:  Recent Labs Lab 09/22/16 0500 09/23/16 0500 09/24/16 1214 09/25/16 0306 09/26/16 0214 09/28/16 1000  NA 135 132* 128* 128* 129* 129*  K 3.6 3.4* 3.6 4.2 3.8 3.2*  CL 90* 87* 85* 85* 86* 87*  CO2 24 21* 17* 15* 13* 16*  GLUCOSE 118* 99 113* 82 87 137*  BUN 64* 99* 96* 115* 139* 110*  CREATININE 8.00* 11.04* 11.52* 13.21* 15.88* 15.02*  CALCIUM 9.8 9.4 9.4 8.6* 8.4* 8.2*  MG 2.6* 2.9* 2.8* 2.8* 2.8*  --   PHOS 7.7* 9.9* 10.9* 11.4* 13.2* 10.1*   Liver  Function Tests:  Recent Labs Lab 09/23/16 0500 09/24/16 1214 09/25/16 0306 09/26/16 0214 09/28/16 1000  ALBUMIN 2.7* 3.1* 2.9* 2.9* 2.9*   No results for input(s): LIPASE, AMYLASE in the last 168 hours. No results for input(s): AMMONIA in the last 168 hours. CBC:  Recent Labs Lab 09/22/16 0500 09/23/16 0500 09/24/16 1214 09/25/16 0306 09/26/16 0214 09/28/16 1000  WBC 17.7* 15.3* 12.4* 11.4* 10.8* 9.2  NEUTROABS 11.4* 9.6* 8.0* 7.5 6.3  --   HGB 10.3* 10.0* 11.5* 10.1* 9.7* 10.1*  HCT 32.0* 30.4* 34.9* 29.8* 29.0* 30.2*  MCV 78.4 77.6* 76.9* 75.6* 74.2* 74.6*  PLT 267 248 253 231 235 229   Cardiac Enzymes: No results for input(s): CKTOTAL, CKMB, CKMBINDEX, TROPONINI in the last 168 hours. BNP: BNP (last 3 results)  Recent Labs  09/11/16 0549  BNP 104.3*    ProBNP (last 3 results) No results for input(s): PROBNP in the last 8760 hours.  CBG:  Recent Labs Lab 09/27/16 2058 09/28/16 0028 09/28/16 0448 09/28/16 0828 09/28/16 1402  GLUCAP 103* 106* 108* 117* 104*

## 2016-09-28 NOTE — Discharge Instructions (Signed)
° ° °  09/26/2016 Paul [name] [id number] 21-Apr-1991  Surgeon(s): Fields, Jessy Oto, MD  Procedure(s): INSERTION OF DIALYSIS CATHETER - Right Internal Jugular Placement LEFT Brachial Cephalic AV Fistula Creation  x Do not stick fistula for 12 weeks

## 2016-09-28 NOTE — Progress Notes (Signed)
Family Medicine Teaching Service Transfer Inquiry  Brief summary of reason for admission/transfer: Spoke with [facility] provider Dr. Charlies Silvers. Patient was a [facility] patient (last seen by Dr. Lonny Prude in 11/2014) and was admitted to [facility] after being cared for by CCM for acute hypoxic respiratory failure, requiring intubation. Patient has been receiving intermittent dialysis and had permacath and left brachial AVF placed 5/7. Given that this is patient's hospitalization day 17 and he appears to be approaching discharge, discussed with my attending Dr. Nori Riis and felt that transfer would be unsafe at this point in time and not in best interest of patient. Dr. Charlies Silvers plans to discuss with [facility] attending Dr. Nori Riis. [facility] does not plan to accept patient at this time.  In addition, patient has actually transferred primary care to [facility] per resident who cared for him on Nephrology consult service.   Rogue Bussing, MD PGY-2, [facility] Medicine Teaching Service Service Pager [phone]

## 2016-09-28 NOTE — Procedures (Signed)
Pt seen on HD.  Ap 210 Vp 170  BFR 350.  SBP 119.  Tolerating HD well so for.

## 2016-09-28 NOTE — Progress Notes (Signed)
SLP Cancellation Note  Patient Details Name: Paul [name] MRN: [id number] DOB: 09-19-1990   Cancelled treatment:        Attempted to work with re: dysphagia. Pt left for HD 30 min prior.    Houston Siren 09/28/2016, 10:55 AM  Orbie Pyo Colvin Caroli.Ed [employer] [phone]

## 2016-09-28 NOTE — Progress Notes (Signed)
PT Cancellation Note  Patient Details Name: Paul [name] MRN: [id number] DOB: 1991/02/04   Cancelled Treatment:    Reason Eval/Treat Not Completed: Patient at procedure or test/unavailable   Was at HD when attempted PT session;   Will follow up later today as time allows;  Otherwise, will follow up for PT tomorrow;   Thank you,  Roney Marion, PT  Acute Rehabilitation Services Pager [phone] Office [place] 09/28/2016, 2:41 PM

## 2016-09-28 NOTE — Progress Notes (Signed)
Patient returned to floor.

## 2016-09-28 NOTE — Care Management Note (Signed)
Case Management Note  Patient Details  Name: Kale Rondeau MRN: [id number] Date of Birth: [birth year]  Subjective/Objective:          CM following for progression and d/c planning.           Action/Plan: 09/28/2016 Noted orders for HHPT and HHRN. Please note that this pt has no acute needs to support HHPT needs other than generalized weakness, and given age we are unable to proved HHPT for this pt. Pt will have Medicaid shortly, however [place] Medicaid will not cover HHPT except in cases of trauma or stroke.  Also noted order for [facility], unable to identify a skilled need for this pt, will discuss with MD.  Will assist with any DME needs.  Expected Discharge Date:                  Expected Discharge Plan:  Home/Self Care  In-House Referral:  Clinical Social Work  Discharge planning Services  CM Consult  Post Acute Care Choice:    Choice offered to:     DME Arranged:    DME Agency:     HH Arranged:    HH Agency:     Status of Service:  In process, will continue to follow  If discussed at Long Length of Stay Meetings, dates discussed:    Additional Comments:  Adron Bene, RN 09/28/2016, 3:00 PM

## 2016-09-28 NOTE — Plan of Care (Signed)
Problem: Education: Goal: Knowledge of [place] [facility] Education information/materials will improve Outcome: Progressing POC reviewed with pt. and talked with pt. about moving head/neck (R) side to avoid stiffness. Notified Baltazar Najjar, NP re: pain med- pt. didn't want Dilaudid and only had Tylenol supp.; asked for po Tylenol and another pain med if possible.

## 2016-09-29 ENCOUNTER — Encounter (HOSPITAL_COMMUNITY): Payer: Self-pay

## 2016-09-29 LAB — RENAL FUNCTION PANEL
ALBUMIN: 2.9 g/dL — AB (ref 3.5–5.0)
ANION GAP: 20 — AB (ref 5–15)
BUN: 57 mg/dL — AB (ref 6–20)
CALCIUM: 8.8 mg/dL — AB (ref 8.9–10.3)
CO2: 21 mmol/L — ABNORMAL LOW (ref 22–32)
Chloride: 92 mmol/L — ABNORMAL LOW (ref 101–111)
Creatinine, Ser: 10.45 mg/dL — ABNORMAL HIGH (ref 0.61–1.24)
GFR calc Af Amer: 7 mL/min — ABNORMAL LOW (ref >60)
GFR calc non Af Amer: 6 mL/min — ABNORMAL LOW (ref >60)
GLUCOSE: 115 mg/dL — AB (ref 65–99)
POTASSIUM: 3.1 mmol/L — AB (ref 3.5–5.1)
Phosphorus: 5.7 mg/dL — ABNORMAL HIGH (ref 2.5–4.6)
SODIUM: 133 mmol/L — AB (ref 135–145)

## 2016-09-29 LAB — GLUCOSE, CAPILLARY
GLUCOSE-CAPILLARY: 114 mg/dL — AB (ref 65–99)
Glucose-Capillary: 106 mg/dL — ABNORMAL HIGH (ref 65–99)
Glucose-Capillary: 113 mg/dL — ABNORMAL HIGH (ref 65–99)

## 2016-09-29 LAB — CBC
HEMATOCRIT: 29.8 % — AB (ref 39.0–52.0)
HEMOGLOBIN: 9.9 g/dL — AB (ref 13.0–17.0)
MCH: 25.3 pg — ABNORMAL LOW (ref 26.0–34.0)
MCHC: 33.2 g/dL (ref 30.0–36.0)
MCV: 76 fL — ABNORMAL LOW (ref 78.0–100.0)
Platelets: 231 10*3/uL (ref 150–400)
RBC: 3.92 MIL/uL — ABNORMAL LOW (ref 4.22–5.81)
RDW: 17.6 % — AB (ref 11.5–15.5)
WBC: 9.5 10*3/uL (ref 4.0–10.5)

## 2016-09-29 MED FILL — AMLODIPINE BESYLATE 5 MG TA: 5 | 30 days supply | Qty: 30 | Fill #0

## 2016-09-29 MED FILL — METOPROLOL TARTRATE 25 MG T: 25 | 30 days supply | Qty: 30 | Fill #0

## 2016-09-29 MED FILL — RENA-VITE TABLET: 30 days supply | Qty: 30 | Fill #0

## 2016-09-29 MED FILL — CALCIUM ACETATE (PHOS BINDE: 667 | 15 days supply | Qty: 90 | Fill #0

## 2016-09-29 NOTE — Procedures (Signed)
Pt seen on HD. Ap 240 Vp 180.  BFR 350.  Note plans for DC.  He is set ip for outpt HD at [facility] 2nd shift but he knows to go earlier on Sat to do paperwork.

## 2016-09-29 NOTE — Progress Notes (Signed)
Accepted at [place] 1st Treatment 10/01/16 at 11:00 am.schedule date and time  Tuesday,Thursday ,Saturday at 11:45 am .

## 2016-09-29 NOTE — Plan of Care (Signed)
Problem: Education: Goal: Knowledge of [place] [facility] Education information/materials will improve Outcome: Progressing POC reviewed with pt.   

## 2016-09-29 NOTE — Care Management Note (Addendum)
Case Management Note  Patient Details  Name: Rawley Harju MRN: [id number] Date of Birth: 1991-03-02  Subjective/Objective:       CM following for progression and d/c planning.              Action/Plan: 09/29/16 Noted consult for [place], however we will be unable to arrange this as pt issue is weakness from lack of activity and pt is able to move about as he wishes. We have ask for an eval by [facility] for a bariatric rolling walker as the pt is uninsured.  [place] letter provided for medications at the time of d/c.  2pm met with pt and family, walker to be delivered to room, [facility] letter given and explained. Pt and family informed of [facility] and possible free CBG meter.  HD schedule discussed with Charlyne Quale of HD unit and family provided with all info by Freda Munro.  Expected Discharge Date:  09/29/16               Expected Discharge Plan:  Home/Self Care  In-House Referral:  Clinical Social Work  Discharge planning Services  CM Consult  Post Acute Care Choice:  Durable Medical Equipment Choice offered to:  NA  DME Arranged:  Gilford Rile rolling DME Agency:  [place]:  NA [place] Agency:  NA  Status of Service:  Completed, signed off  If discussed at [place] of Stay Meetings, dates discussed:    Additional Comments:  Adron Bene, RN 09/29/2016, 10:40 AM

## 2016-09-29 NOTE — Progress Notes (Signed)
Patient seen and examined at bedside. Patient is medically stable for discharge after hemodialysis. Please refer to discharge note completed 09/28/2016.  Leisa Lenz [facility] [phone]

## 2016-09-29 NOTE — Progress Notes (Signed)
Pt. wanting sleep med- had offered pain med; told pt. That he slept good last night after receiving pain med and still wanted sleep med. Paged L. [place], NP and informed of pt. wanting sleep med, and that HR has been 103. 105 and occ. up to 127 - 130. She stated with pt.'s hx of sleep apnea she rather pt. try pain med, and to watch HR. Pt. In bariatric air matress bed and the controls stopped working; another bed ordered and pt transferred to the new bed.

## 2016-09-29 NOTE — Progress Notes (Signed)
Hemodialysis: System clotted, unable to give blood back via the venous port. New system set up and tx resumed after 71min delay to tear down and set up machine.

## 2016-09-29 NOTE — Discharge Planning (Signed)
Pt given follow up info, prescriptions, MATCH paperwork, walker, and education given about insulin and BP management. Family to drive pt home. Denies questions.

## 2016-09-30 DIAGNOSIS — N189 Chronic kidney disease, unspecified: Secondary | ICD-10-CM | POA: Insufficient documentation

## 2016-09-30 DIAGNOSIS — D689 Coagulation defect, unspecified: Secondary | ICD-10-CM | POA: Insufficient documentation

## 2016-09-30 DIAGNOSIS — J151 Pneumonia due to Pseudomonas: Secondary | ICD-10-CM | POA: Insufficient documentation

## 2016-09-30 DIAGNOSIS — N2581 Secondary hyperparathyroidism of renal origin: Secondary | ICD-10-CM | POA: Insufficient documentation

## 2016-10-06 ENCOUNTER — Encounter: Payer: Self-pay | Admitting: Licensed Clinical Social Worker

## 2016-10-06 ENCOUNTER — Ambulatory Visit (INDEPENDENT_AMBULATORY_CARE_PROVIDER_SITE_OTHER): Payer: Self-pay | Admitting: Internal Medicine

## 2016-10-06 VITALS — BP 140/60 | Temp 97.8°F

## 2016-10-06 DIAGNOSIS — E1165 Type 2 diabetes mellitus with hyperglycemia: Secondary | ICD-10-CM

## 2016-10-06 DIAGNOSIS — L98422 Non-pressure chronic ulcer of back with fat layer exposed: Secondary | ICD-10-CM

## 2016-10-06 DIAGNOSIS — L89303 Pressure ulcer of unspecified buttock, stage 3: Secondary | ICD-10-CM

## 2016-10-06 DIAGNOSIS — E1122 Type 2 diabetes mellitus with diabetic chronic kidney disease: Secondary | ICD-10-CM

## 2016-10-06 DIAGNOSIS — N179 Acute kidney failure, unspecified: Secondary | ICD-10-CM

## 2016-10-06 DIAGNOSIS — Z794 Long term (current) use of insulin: Secondary | ICD-10-CM

## 2016-10-06 DIAGNOSIS — Z992 Dependence on renal dialysis: Secondary | ICD-10-CM

## 2016-10-06 DIAGNOSIS — N186 End stage renal disease: Secondary | ICD-10-CM

## 2016-10-06 DIAGNOSIS — IMO0002 Reserved for concepts with insufficient information to code with codable children: Secondary | ICD-10-CM

## 2016-10-06 MED ORDER — TRAMADOL HCL 50 MG PO TABS: 50.0000 mg | ORAL_TABLET | Freq: Three times a day (TID) | ORAL | 0 refills | Status: DC | PRN

## 2016-10-06 NOTE — Patient Instructions (Addendum)
Pharmacy recommends ReliOn Meter that you can purchase from [facility] for $7. You can purchase strips for $9. Please check one fasting blood sugar per day and record this number. You do not need to check more than that right now. We should have more Lantus samples soon so don't let yourself run out of insulin.   I have placed a referral to Wound Care. I have also prescribed Tramadol for your buttocks sore. This is a temporary medication to help with your acute pain.   Please make a follow up appointment with Dr. Lajuana Ripple within the next month.

## 2016-10-06 NOTE — Progress Notes (Signed)
Subjective:    Paul [name] - 26 y.o. male MRN [id number]  Date of birth: 1991/02/18  HPI  Paul [name] is here for hospital follow up. Patient has PMH significant for T2DM, HTN, HLD, CKD. Patient was admitted on 4/22 with acute respiratory failure multifactorial from PNA, pulmonary edmea, and OSA. Patient required intubation during hospital course and was treated with antibiotics. Hospital course was complicated by AKI superimposed on CKD stage 5. He required CRRT during ICU stay and was later transitioned to intermittent dialysis. A permacath and AVF left brachial were placed during hospitalization. Patient was discharged on 09/28/16.   AKI superimposed on CKD stage 5 Since discharge, patient reports that he has received three seasons of HD. His labs are being monitored at dialysis. He is now making small amount of urine again. Does not have follow up appointment scheduled with [facility] yet.    T2DM:  Lantus was decreased from 50u BID to 25u BID during hospitalization. Patient reports he is still taking reduced dose. He has been checking random CBGs multiple times per day using his sister's meter (he does not have a meter or diabetic supplies of his own). CBGs have been ranging between 110-190. He has not been checking fasting CBGs.    Sacral Ulcer:  Patient reports sacral ulcer as result of significant length of stay in hospital bed. The area is painful. He has been applying guaze at home over the area. Has not been seen by wound care. Afebrile without nausea/vomiting. Requests pain medications.    -  reports that he has never smoked. He has never used smokeless tobacco. - Review of Systems: Per HPI. - Past Medical History: Patient Active Problem List   Diagnosis Date Noted  . VAP (ventilator-associated pneumonia) ([place])   . AKI (acute kidney injury) ([place])   . HAP (hospital-acquired pneumonia)   . Respiratory distress   . Acute hypoxemic respiratory failure ([place])  09/11/2016  . Pressure injury of skin 09/11/2016  . Acute renal failure (ARF) ([place])   . Metabolic acidosis   . Acute pulmonary edema (H[facility])   . Hypothyroidism 10/29/2009  . Diabetes mellitus type 2, uncontrolled ([place]) 04/14/2009  . Hyperlipidemia 04/14/2009  . OBESITY, MORBID 04/14/2009  . Essential hypertension, benign 04/14/2009   - Medications: reviewed and updated   Objective:   Physical Exam BP 140/60 (BP Location: Right Arm, Patient Position: Sitting, Cuff Size: Normal)   Temp 97.8 F (36.6 C) (Oral)   SpO2 97%  Gen: NAD, alert, cooperative with exam, in wheelchair  CV: RRR, good S1/S2, no murmur, no edema, capillary refill brisk Resp: CTABL, no wheezes, non-labored Skin: A few Stage II-III sacral ulcers present without drainage or surrounding erythema with good granulation tissue at edges. Left AV fistula present without erythema or drainage.  Neuro: able to stand with support, alert and oriented      Assessment & Plan:   Acute renal failure (ARF) ([place]) Patient compliant with HD. Labs being monitored by nephrology. AV fistula appears to be healing well. Have recommended patient inquire about follow up at [place]. VVS follow up scheduled for 6 weeks s/p discharge.   Diabetes mellitus type 2, uncontrolled Controlled with recent A1c 5.5. CBGs appear to be adequately controlled with reduction of Lantus to 25u BID although patient self reports numbers without a log. Recommended Reli-On meter and supplies from [employer] due to lack of insurance. Have asked patient to check one fasting CBG daily and record this number. Return for follow up  with PCP in 2-4 weeks.   Pressure injury of skin Sacral ulcer appears to be healing well without signs of infection. Have prescribed Tramadol for pain and discussed that this would be a temporary medication. Have placed wound care referral. Discussed return precautions that would be concerning for an infection.   Casimer Lanius, CSW saw patient at [facility]  to discuss financial assistance and other potential barriers to care. Please see her separate note from 5/17.   Phill Myron, D.O. 10/08/2016, 10:54 AM PGY-2, [place]

## 2016-10-06 NOTE — Progress Notes (Signed)
  Total time:15 minutes Type of Service: [place] Name and Language:NA  SUBJECTIVE: Paul [name] is a 26 y.o. male  Patient was referred by Dr. Juleen China for:  resources. Patient is accompanied by his adult sister Paul [name] [phone]).  Patient reports the following concerns: Medicaid and disability is pending.  Would like information on the [employer].  LIFE CONTEXT:  Family & Social: lives with his mother Paul [name] [phone], Sister transports patient to dialysis appointments.  School/ Work: Recently applied for disability and Medicaid  Self-Care: not assessed  Life changes: Recently started dialysis, had 3rd treatment. Chair time is  T, TH 11:45AM at [facility] on [address].  INTERVENTIONS:Community Resource, Reflective listening  ASSESSMENT:  Patient currently needs assistance with coverage until his medicaid and disability are approved.  LCSW provided [employer] and information on the MAP program. assessed for other ongoing needs including transportation to dialysis appointments.  PLAN: 1. Patient will complete [employer] Card application and schedule an appointment with Ms Kennyth Lose 2. Patient will get medication from MAP program as he indicated he is already established there   Warm Hand Off Completed.     Casimer Lanius, LCSW Licensed Clinical Social Worker [facility]   [phone] 11:28 AM

## 2016-10-07 ENCOUNTER — Encounter (HOSPITAL_COMMUNITY): Payer: Self-pay

## 2016-10-07 ENCOUNTER — Ambulatory Visit (HOSPITAL_COMMUNITY)
Admission: RE | Admit: 2016-10-07 | Discharge: 2016-10-07 | Disposition: A | Discharge status: Home / Self Care | Payer: Self-pay | Source: Ambulatory Visit | Attending: Interventional Radiology | Admitting: Interventional Radiology

## 2016-10-07 ENCOUNTER — Other Ambulatory Visit: Payer: Self-pay

## 2016-10-07 ENCOUNTER — Encounter (HOSPITAL_COMMUNITY): Payer: Self-pay | Admitting: *Deleted

## 2016-10-07 ENCOUNTER — Other Ambulatory Visit (HOSPITAL_COMMUNITY): Payer: Self-pay | Admitting: Interventional Radiology

## 2016-10-07 DIAGNOSIS — Z0279 Encounter for issue of other medical certificate: Secondary | ICD-10-CM | POA: Diagnosis not present

## 2016-10-07 DIAGNOSIS — N186 End stage renal disease: Secondary | ICD-10-CM

## 2016-10-07 NOTE — Progress Notes (Signed)
Pt SDW-Pre-op call completed by pt. Pt denies SOB, chest pain, and being under the care of a cardiologist. Pt denies having a stress test and cardiac cath. Pt denies having a difficult airway ( see FYI). Pt requested that mother, Santiago Glad,  is given pre-op instructions. Mother advised to have pt stop taking vitamins, fish oil and herbal medications. Do not take any NSAIDs ie: Ibuprofen, Advil, Naproxen, BC and Goody Powder. Mother made aware to have pt take 12 units of Lantus Insulin tonight instead of 25 units, check BG every 2 hours prior to arrival to hospital on DOS, if BG is < 70, do not take 12 units ( half of 25 units) of Lantus, instead drink 4 ounces of cranberry juice, wait 15 minutes and recheck BG, if BG remains < 70 call SS unit to speak with nurse. Mother verbalized understanding of all pre-op instructions.

## 2016-10-08 ENCOUNTER — Ambulatory Visit (HOSPITAL_COMMUNITY): Payer: Medicaid Other

## 2016-10-08 ENCOUNTER — Ambulatory Visit (HOSPITAL_COMMUNITY)
Admission: RE | Admit: 2016-10-08 | Discharge: 2016-10-08 | Disposition: A | Discharge status: Home / Self Care | Payer: Medicaid Other | Source: Ambulatory Visit | Attending: Vascular Surgery | Admitting: Vascular Surgery

## 2016-10-08 ENCOUNTER — Encounter (HOSPITAL_COMMUNITY): Payer: Self-pay | Admitting: *Deleted

## 2016-10-08 ENCOUNTER — Ambulatory Visit (HOSPITAL_COMMUNITY): Payer: Medicaid Other | Admitting: Certified Registered Nurse Anesthetist

## 2016-10-08 ENCOUNTER — Encounter (HOSPITAL_COMMUNITY)
Admission: RE | Disposition: A | Discharge status: Home / Self Care | Payer: Self-pay | Source: Ambulatory Visit | Attending: Vascular Surgery

## 2016-10-08 DIAGNOSIS — Z992 Dependence on renal dialysis: Secondary | ICD-10-CM

## 2016-10-08 DIAGNOSIS — Z79899 Other long term (current) drug therapy: Secondary | ICD-10-CM | POA: Insufficient documentation

## 2016-10-08 DIAGNOSIS — E1122 Type 2 diabetes mellitus with diabetic chronic kidney disease: Secondary | ICD-10-CM | POA: Insufficient documentation

## 2016-10-08 DIAGNOSIS — I120 Hypertensive chronic kidney disease with stage 5 chronic kidney disease or end stage renal disease: Secondary | ICD-10-CM | POA: Insufficient documentation

## 2016-10-08 DIAGNOSIS — E039 Hypothyroidism, unspecified: Secondary | ICD-10-CM | POA: Diagnosis not present

## 2016-10-08 DIAGNOSIS — Z452 Encounter for adjustment and management of vascular access device: Secondary | ICD-10-CM | POA: Diagnosis not present

## 2016-10-08 DIAGNOSIS — N185 Chronic kidney disease, stage 5: Secondary | ICD-10-CM

## 2016-10-08 DIAGNOSIS — K219 Gastro-esophageal reflux disease without esophagitis: Secondary | ICD-10-CM | POA: Diagnosis not present

## 2016-10-08 DIAGNOSIS — Z794 Long term (current) use of insulin: Secondary | ICD-10-CM | POA: Insufficient documentation

## 2016-10-08 DIAGNOSIS — N186 End stage renal disease: Secondary | ICD-10-CM | POA: Insufficient documentation

## 2016-10-08 HISTORY — PX: EXCHANGE OF A DIALYSIS CATHETER: SHX5818

## 2016-10-08 HISTORY — DX: Gastro-esophageal reflux disease without esophagitis: K21.9

## 2016-10-08 HISTORY — DX: Chronic kidney disease, unspecified: N18.9

## 2016-10-08 HISTORY — DX: Morbid (severe) obesity due to excess calories: E66.01

## 2016-10-08 HISTORY — DX: Unspecified open wound of lower back and pelvis without penetration into retroperitoneum, initial encounter: S31.000A

## 2016-10-08 LAB — POCT I-STAT 4, (NA,K, GLUC, HGB,HCT)
GLUCOSE: 127 mg/dL — AB (ref 65–99)
HCT: 28 % — ABNORMAL LOW (ref 39.0–52.0)
Hemoglobin: 9.5 g/dL — ABNORMAL LOW (ref 13.0–17.0)
Potassium: 3.5 mmol/L (ref 3.5–5.1)
Sodium: 138 mmol/L (ref 135–145)

## 2016-10-08 LAB — GLUCOSE, CAPILLARY
GLUCOSE-CAPILLARY: 124 mg/dL — AB (ref 65–99)
Glucose-Capillary: 116 mg/dL — ABNORMAL HIGH (ref 65–99)

## 2016-10-08 SURGERY — EXCHANGE OF A DIALYSIS CATHETER
Anesthesia: General | Site: Neck | Laterality: Right

## 2016-10-08 MED ORDER — PROPOFOL 1000 MG/100ML IV EMUL
INTRAVENOUS | Status: AC
  Filled 2016-10-08: qty 100

## 2016-10-08 MED ORDER — MIDAZOLAM HCL 5 MG/5ML IJ SOLN
INTRAMUSCULAR | Status: DC | PRN
  Administered 2016-10-08: 2 mg via INTRAVENOUS

## 2016-10-08 MED ORDER — PHENYLEPHRINE HCL 10 MG/ML IJ SOLN
INTRAVENOUS | Status: DC | PRN
  Administered 2016-10-08: 25 ug/min via INTRAVENOUS

## 2016-10-08 MED ORDER — HEPARIN SODIUM (PORCINE) 1000 UNIT/ML IJ SOLN
INTRAMUSCULAR | Status: AC
  Filled 2016-10-08: qty 1

## 2016-10-08 MED ORDER — ONDANSETRON HCL 4 MG/2ML IJ SOLN
INTRAMUSCULAR | Status: DC | PRN
  Administered 2016-10-08: 4 mg via INTRAVENOUS

## 2016-10-08 MED ORDER — OXYCODONE HCL 5 MG PO TABS
ORAL_TABLET | ORAL | Status: AC
  Filled 2016-10-08: qty 1

## 2016-10-08 MED ORDER — FENTANYL CITRATE (PF) 250 MCG/5ML IJ SOLN
INTRAMUSCULAR | Status: AC
  Filled 2016-10-08: qty 5

## 2016-10-08 MED ORDER — PHENYLEPHRINE HCL 10 MG/ML IJ SOLN
INTRAMUSCULAR | Status: DC | PRN
  Administered 2016-10-08: 80 ug via INTRAVENOUS

## 2016-10-08 MED ORDER — DEXTROSE 5 % IV SOLN
1.5000 g | INTRAVENOUS | Status: AC
  Administered 2016-10-08: 1.5 g via INTRAVENOUS
  Filled 2016-10-08: qty 1.5

## 2016-10-08 MED ORDER — SODIUM CHLORIDE 0.9 % IV SOLN
INTRAVENOUS | Status: DC
  Administered 2016-10-08 (×2): via INTRAVENOUS

## 2016-10-08 MED ORDER — LIDOCAINE-EPINEPHRINE (PF) 1 %-1:200000 IJ SOLN
INTRAMUSCULAR | Status: AC
  Filled 2016-10-08: qty 30

## 2016-10-08 MED ORDER — PROPOFOL 10 MG/ML IV BOLUS
INTRAVENOUS | Status: AC
  Filled 2016-10-08: qty 20

## 2016-10-08 MED ORDER — HEPARIN SODIUM (PORCINE) 1000 UNIT/ML IJ SOLN
INTRAMUSCULAR | Status: DC | PRN
  Administered 2016-10-08: 3.4 [IU] via INTRAVENOUS

## 2016-10-08 MED ORDER — METOPROLOL TARTRATE 12.5 MG HALF TABLET
12.5000 mg | ORAL_TABLET | Freq: Once | ORAL | Status: AC
  Administered 2016-10-08: 12.5 mg via ORAL
  Filled 2016-10-08: qty 1

## 2016-10-08 MED ORDER — FENTANYL CITRATE (PF) 100 MCG/2ML IJ SOLN: 25.0000 ug | INTRAMUSCULAR | Status: DC | PRN

## 2016-10-08 MED ORDER — LIDOCAINE HCL (CARDIAC) 20 MG/ML IV SOLN
INTRAVENOUS | Status: DC | PRN
  Administered 2016-10-08: 80 mg via INTRATRACHEAL

## 2016-10-08 MED ORDER — CHLORHEXIDINE GLUCONATE CLOTH 2 % EX PADS: 6.0000 | MEDICATED_PAD | Freq: Once | CUTANEOUS | Status: DC

## 2016-10-08 MED ORDER — SUCCINYLCHOLINE CHLORIDE 20 MG/ML IJ SOLN
INTRAMUSCULAR | Status: DC | PRN
  Administered 2016-10-08: 100 mg via INTRAVENOUS

## 2016-10-08 MED ORDER — OXYCODONE HCL 5 MG/5ML PO SOLN: 5.0000 mg | Freq: Once | ORAL | Status: AC | PRN

## 2016-10-08 MED ORDER — FENTANYL CITRATE (PF) 100 MCG/2ML IJ SOLN
INTRAMUSCULAR | Status: DC | PRN
  Administered 2016-10-08 (×2): 50 ug via INTRAVENOUS
  Administered 2016-10-08 (×2): 25 ug via INTRAVENOUS

## 2016-10-08 MED ORDER — SODIUM CHLORIDE 0.9 % IV SOLN
INTRAVENOUS | Status: DC | PRN
  Administered 2016-10-08: 08:00:00

## 2016-10-08 MED ORDER — METOPROLOL TARTRATE 12.5 MG HALF TABLET
ORAL_TABLET | ORAL | Status: AC
  Administered 2016-10-08: 12.5 mg via ORAL
  Filled 2016-10-08: qty 1

## 2016-10-08 MED ORDER — MIDAZOLAM HCL 2 MG/2ML IJ SOLN
INTRAMUSCULAR | Status: AC
  Filled 2016-10-08: qty 2

## 2016-10-08 MED ORDER — OXYCODONE HCL 5 MG PO TABS
5.0000 mg | ORAL_TABLET | Freq: Once | ORAL | Status: AC | PRN
  Administered 2016-10-08: 5 mg via ORAL

## 2016-10-08 MED ORDER — PROPOFOL 10 MG/ML IV BOLUS
INTRAVENOUS | Status: DC | PRN
  Administered 2016-10-08: 140 mg via INTRAVENOUS

## 2016-10-08 SURGICAL SUPPLY — 43 items
BAG DECANTER FOR FLEXI CONT (MISCELLANEOUS) ×3 IMPLANT
BIOPATCH RED 1 DISK 7.0 (GAUZE/BANDAGES/DRESSINGS) ×2 IMPLANT
BIOPATCH RED 1IN DISK 7.0MM (GAUZE/BANDAGES/DRESSINGS) ×1
CATH PALINDROME RT-P 15FX19CM (CATHETERS) IMPLANT
CATH PALINDROME RT-P 15FX23CM (CATHETERS) IMPLANT
CATH PALINDROME RT-P 15FX28CM (CATHETERS) IMPLANT
CATH PALINDROME RT-P 15FX55CM (CATHETERS) IMPLANT
COVER PROBE W GEL 5X96 (DRAPES) ×3 IMPLANT
COVER SURGICAL LIGHT HANDLE (MISCELLANEOUS) ×3 IMPLANT
DRAPE C-ARM 42X72 X-RAY (DRAPES) ×3 IMPLANT
DRAPE CHEST BREAST 15X10 FENES (DRAPES) ×3 IMPLANT
GAUZE SPONGE 2X2 8PLY STRL LF (GAUZE/BANDAGES/DRESSINGS) ×1 IMPLANT
GAUZE SPONGE 4X4 16PLY XRAY LF (GAUZE/BANDAGES/DRESSINGS) ×3 IMPLANT
GLOVE BIO SURGEON STRL SZ7 (GLOVE) ×6 IMPLANT
GLOVE BIO SURGEON STRL SZ7.5 (GLOVE) ×3 IMPLANT
GLOVE BIOGEL PI IND STRL 6.5 (GLOVE) ×1 IMPLANT
GLOVE BIOGEL PI IND STRL 7.0 (GLOVE) ×1 IMPLANT
GLOVE BIOGEL PI IND STRL 7.5 (GLOVE) ×2 IMPLANT
GLOVE BIOGEL PI INDICATOR 6.5 (GLOVE) ×2
GLOVE BIOGEL PI INDICATOR 7.0 (GLOVE) ×2
GLOVE BIOGEL PI INDICATOR 7.5 (GLOVE) ×4
GOWN STRL REUS W/ TWL LRG LVL3 (GOWN DISPOSABLE) ×3 IMPLANT
GOWN STRL REUS W/TWL LRG LVL3 (GOWN DISPOSABLE) ×6
KIT BASIN OR (CUSTOM PROCEDURE TRAY) ×3 IMPLANT
KIT ROOM TURNOVER OR (KITS) ×3 IMPLANT
NEEDLE 18GX1X1/2 (RX/OR ONLY) (NEEDLE) ×3 IMPLANT
NEEDLE HYPO 25GX1X1/2 BEV (NEEDLE) ×3 IMPLANT
NS IRRIG 1000ML POUR BTL (IV SOLUTION) ×3 IMPLANT
PACK SURGICAL SETUP 50X90 (CUSTOM PROCEDURE TRAY) ×3 IMPLANT
PAD ARMBOARD 7.5X6 YLW CONV (MISCELLANEOUS) ×6 IMPLANT
SOAP 2 % CHG 4 OZ (WOUND CARE) ×3 IMPLANT
SPONGE GAUZE 2X2 STER 10/PKG (GAUZE/BANDAGES/DRESSINGS) ×2
SUT ETHILON 3 0 PS 1 (SUTURE) ×3 IMPLANT
SUT MNCRL AB 4-0 PS2 18 (SUTURE) ×3 IMPLANT
SYR 10ML LL (SYRINGE) ×3 IMPLANT
SYR 20CC LL (SYRINGE) ×6 IMPLANT
SYR 30ML LL (SYRINGE) IMPLANT
SYR 3ML LL SCALE MARK (SYRINGE) ×3 IMPLANT
SYR 5ML LL (SYRINGE) ×3 IMPLANT
SYR CONTROL 10ML LL (SYRINGE) ×3 IMPLANT
TAPE CLOTH SURG 4X10 WHT LF (GAUZE/BANDAGES/DRESSINGS) ×3 IMPLANT
WATER STERILE IRR 1000ML POUR (IV SOLUTION) ×3 IMPLANT
WIRE AMPLATZ SS-J .035X180CM (WIRE) ×3 IMPLANT

## 2016-10-08 NOTE — Anesthesia Preprocedure Evaluation (Signed)
Anesthesia Evaluation  Patient identified by MRN, date of birth, ID band Patient awake    Reviewed: Allergy & Precautions, NPO status , Patient's Chart, lab work & pertinent test results  History of Anesthesia Complications Negative for: history of anesthetic complications  Airway Mallampati: II  TM Distance: >3 FB Neck ROM: Full    Dental  (+) Teeth Intact   Pulmonary neg pulmonary ROS,    breath sounds clear to auscultation       Cardiovascular hypertension, Pt. on medications and Pt. on home beta blockers  Rhythm:Regular Rate:Normal     Neuro/Psych negative neurological ROS  negative psych ROS   GI/Hepatic Neg liver ROS, GERD  Controlled,  Endo/Other  diabetes, Insulin DependentHypothyroidism Morbid obesity  Renal/GU ESRF and DialysisRenal disease     Musculoskeletal   Abdominal   Peds  Hematology   Anesthesia Other Findings   Reproductive/Obstetrics                             Anesthesia Physical Anesthesia Plan  ASA: III  Anesthesia Plan: General   Post-op Pain Management:    Induction: Intravenous  Airway Management Planned: Oral ETT  Additional Equipment: None  Intra-op Plan:   Post-operative Plan: Extubation in OR  Informed Consent: I have reviewed the patients History and Physical, chart, labs and discussed the procedure including the risks, benefits and alternatives for the proposed anesthesia with the patient or authorized representative who has indicated his/her understanding and acceptance.   Dental advisory given  Plan Discussed with: CRNA and Surgeon  Anesthesia Plan Comments:         Anesthesia Quick Evaluation

## 2016-10-08 NOTE — H&P (Signed)
Brief History and Physical  History of Present Illness  Paul [name] is a 26 y.o. male who presents with chief complaint: non-functioning TDC.  The patient presents today for [facility] exchange.    Past Medical History:  Diagnosis Date  . Chronic kidney disease    ARF on CRF  . Diabetes mellitus    Type II  . GERD (gastroesophageal reflux disease)   . Hypertension   . Morbid obesity ([place])   . Sacral wound   . Thyroid disease     Past Surgical History:  Procedure Laterality Date  . AV FISTULA PLACEMENT Left 09/26/2016   Procedure: LEFT UPPER ARM ARTERIOVENOUS (AV) FISTULA CREATION;  Surgeon: Elam Dutch, MD;  Location: [place];  Service: Vascular;  Laterality: Left;  . INSERTION OF DIALYSIS CATHETER Right 09/26/2016   Procedure: INSERTION OF DIALYSIS CATHETER - Right Internal Jugular Placement;  Surgeon: Elam Dutch, MD;  Location: [facility] OR;  Service: Vascular;  Laterality: Right;    Social History   Social History  . Marital status: Single    Spouse name: N/A  . Number of children: N/A  . Years of education: N/A   Occupational History  . Not on file.   Social History Main Topics  . Smoking status: Never Smoker  . Smokeless tobacco: Never Used  . Alcohol use No  . Drug use: No  . Sexual activity: Not on file   Other Topics Concern  . Not on file   Social History Narrative  . No narrative on file    Family History  Problem Relation Age of Onset  . Heart disease Mother   . Hypertension Mother     No current facility-administered medications on file prior to encounter.    Current Outpatient Prescriptions on File Prior to Encounter  Medication Sig Dispense Refill  . acetaminophen (TYLENOL) 325 MG tablet Take 2 tablets (650 mg total) by mouth every 6 (six) hours as needed for mild pain or headache. 30 tablet 0  . amLODipine (NORVASC) 5 MG tablet Take 1 tablet (5 mg total) by mouth daily. 30 tablet 0  . calcium acetate (PHOSLO) 667 MG capsule Take 2  capsules (1,334 mg total) by mouth 3 (three) times daily with meals. 90 capsule 0  . docusate (COLACE) 50 MG/5ML liquid Place 10 mLs (100 mg total) into feeding tube 2 (two) times daily as needed for mild constipation. 100 mL 0  . insulin glargine (LANTUS) 100 UNIT/ML injection Inject 50 u of insulin into the skin BID (Patient taking differently: Inject 25 Units into the skin 2 (two) times daily. Inject 50 u of insulin into the skin BID) 10 mL 0  . levothyroxine (SYNTHROID, LEVOTHROID) 150 MCG tablet Take 1 tablet (150 mcg total) by mouth daily. Needs office visit 90 tablet 0  . Maltodextrin-Xanthan Gum (RESOURCE THICKENUP CLEAR) POWD Take 1 Container by mouth as needed. 1 Can 0  . metoprolol tartrate (LOPRESSOR) 25 MG tablet Take 0.5 tablets (12.5 mg total) by mouth 2 (two) times daily. 60 tablet 0  . multivitamin (RENA-VIT) TABS tablet Take 1 tablet by mouth at bedtime. 30 tablet 0  . rosuvastatin (CRESTOR) 20 MG tablet Take 1 tablet (20 mg total) by mouth daily. 90 tablet 3  . ACCU-CHEK FASTCLIX LANCETS MISC 1 each by Does not apply route 4 (four) times daily -  before meals and at bedtime. 204 each 3  . Blood Glucose Monitoring Suppl (ACCU-CHEK NANO SMARTVIEW) W/DEVICE KIT 1 Device by  Does not apply route 4 (four) times daily -  before meals and at bedtime. 1 kit 0  . glucose blood test strip Use as instructed 100 each 12  . traMADol (ULTRAM) 50 MG tablet Take 1 tablet (50 mg total) by mouth every 8 (eight) hours as needed. 30 tablet 0    Allergies  Allergen Reactions  . Hydralazine Hcl Other (See Comments)    Drug-induced Lupus    Review of Systems: As listed above, otherwise negative.   Physical Examination  Vitals:   10/08/16 0632  BP: 115/86  Pulse: (!) 114  Resp: 20  Temp: 98 F (36.7 C)  TempSrc: Oral  SpO2: 100%  Weight: (!) 335 lb (152 kg)    General: A&O x 3, WDWN  Pulmonary: Sym exp, good air movt, CTAB, no rales, rhonchi, & wheezing, RIJV TDC  Cardiac: RRR, Nl  S1, S2, no Murmurs, rubs or gallops  Laboratory See iStat  Medical Decision Making  Paul [name] is a 26 y.o. male who presents with: thrombosed TDC.   The patient is scheduled for: [facility] exchange, possible placement of new [facility] The patient is aware the risks of tunneled dialysis catheter placement include but are not limited to: bleeding, infection, central venous injury, pneumothorax, possible venous stenosis, possible malpositioning in the venous system, and possible infections related to long-term catheter presence.   The patient is aware of the risks and agrees to proceed.  Adele Barthel, MD, FACS [facility] [place] Office: [phone] Pager: [phone]  10/08/2016, 7:15 AM

## 2016-10-08 NOTE — Assessment & Plan Note (Signed)
Sacral ulcer appears to be healing well without signs of infection. Have prescribed Tramadol for pain and discussed that this would be a temporary medication. Have placed wound care referral. Discussed return precautions that would be concerning for an infection.

## 2016-10-08 NOTE — Transfer of Care (Signed)
Immediate Anesthesia Transfer of Care Note  Patient: Paul [name]  Procedure(s) Performed: Procedure(s): EXCHANGE OF A DIALYSIS CATHETER-RIGHT INTERNAL JUGULAR (Right)  Patient Location: PACU  Anesthesia Type:General  Level of Consciousness: awake, alert  and oriented  Airway & Oxygen Therapy: Patient Spontanous Breathing and Patient connected to nasal cannula oxygen  Post-op Assessment: Report given to RN and Post -op Vital signs reviewed and stable  Post vital signs: Reviewed and stable  Last Vitals:  Vitals:   10/08/16 0632  BP: 115/86  Pulse: (!) 114  Resp: 20  Temp: 36.7 C    Last Pain:  Vitals:   10/08/16 0632  TempSrc: Oral      Patients Stated Pain Goal: 0 (16/10/96 0454)  Complications: No apparent anesthesia complications

## 2016-10-08 NOTE — Anesthesia Procedure Notes (Signed)
Procedure Name: Intubation Date/Time: 10/08/2016 7:40 AM Performed by: Clearnce Sorrel Pre-anesthesia Checklist: Patient identified, Emergency Drugs available, Suction available, Patient being monitored and Timeout performed Patient Re-evaluated:Patient Re-evaluated prior to inductionOxygen Delivery Method: Circle system utilized Preoxygenation: Pre-oxygenation with 100% oxygen Intubation Type: IV induction Laryngoscope Size: Mac and 4 Grade View: Grade I Tube type: Oral Tube size: 7.5 mm Number of attempts: 1 Airway Equipment and Method: Stylet Placement Confirmation: ETT inserted through vocal cords under direct vision,  positive ETCO2 and breath sounds checked- equal and bilateral Secured at: 23 cm Tube secured with: Tape Dental Injury: Teeth and Oropharynx as per pre-operative assessment

## 2016-10-08 NOTE — Op Note (Signed)
    OPERATIVE NOTE  PROCEDURE: 1. right internal jugular vein tunneled dialysis catheter exchange  PRE-OPERATIVE DIAGNOSIS: end-stage renal failure  POST-OPERATIVE DIAGNOSIS: same as above  SURGEON: Adele Barthel, MD  ANESTHESIA: general  ESTIMATED BLOOD LOSS: 30 cc  FINDING(S): 1.  Tips of the catheter in the right atrium on fluoroscopy 2.  No obvious pneumothorax on fluoroscopy  SPECIMEN(S):  none  INDICATIONS:   Paul [name] is a 26 y.o. male who presents with thrombosed right internal jugular vein.  The patient presents for tunneled dialysis catheter placement.  The patient is aware the risks of tunneled dialysis catheter placement include but are not limited to: bleeding, infection, central venous injury, pneumothorax, possible venous stenosis, possible malpositioning in the venous system, and possible infections related to long-term catheter presence.  The patient was aware of these risks and agreed to proceed.  DESCRIPTION: After written full informed consent was obtained from the patient, the patient was taken back to the operating room.  Prior to induction, the patient was given IV antibiotics.  After obtaining adequate sedation, the patient was prepped and draped in the standard fashion for a chest or neck tunneled dialysis catheter placement.  I transected the prior nylon stitches on the tunneled dialysis catheter.  Then I placed an Amplatz wire through one port under fluoroscopic guidance in the inferior vena cava.  I then exchanged the old catheter for a new 23 cm Palindrome, which was placed under fluoroscopic guidance down into the right atrium.  The back end of this catheter was transected, revealing the two lumens of this catheter.  The ports were docked onto these two lumens.  The catheter hub was then clicked into place.  Each port was tested by aspirating and flushing.  Initially there was some resistance so I advanced the catheter into the atrium another 2 cm.  No  resistance was noted on subsequent aspiration.  Each port was then thoroughly flushed with heparinized saline.  The catheter was secured in placed with two interrupted stitches of 3-0 Nylon tied to the catheter.  The neck and chest incision were cleaned and sterile bandages applied.  Each port was then loaded with concentrated heparin (1000 Units/mL) at the manufacturer recommended volumes to each port.  Sterile caps were applied to each port.  On completion fluoroscopy, the tips of the catheter were in the right atrium, and there was no evidence of pneumothorax.   COMPLICATIONS: none  CONDITION: stable   Adele Barthel, MD, [facility] [place] Office: [phone] Pager: [phone]  10/08/2016, 8:10 AM

## 2016-10-08 NOTE — Assessment & Plan Note (Signed)
Patient compliant with HD. Labs being monitored by nephrology. AV fistula appears to be healing well. Have recommended patient inquire about follow up at [place]. [facility] follow up scheduled for 6 weeks s/p discharge.

## 2016-10-08 NOTE — Assessment & Plan Note (Signed)
Controlled with recent A1c 5.5. CBGs appear to be adequately controlled with reduction of Lantus to 25u BID although patient self reports numbers without a log. Recommended Reli-On meter and supplies from [employer] due to lack of insurance. Have asked patient to check one fasting CBG daily and record this number. Return for follow up with PCP in 2-4 weeks.

## 2016-10-08 NOTE — Anesthesia Postprocedure Evaluation (Addendum)
Anesthesia Post Note  Patient: Quinnten Calvin  Procedure(s) Performed: Procedure(s) (LRB): EXCHANGE OF A DIALYSIS CATHETER-RIGHT INTERNAL JUGULAR (Right)  Patient location during evaluation: PACU Anesthesia Type: General Level of consciousness: awake and alert Pain management: pain level controlled Vital Signs Assessment: post-procedure vital signs reviewed and stable Respiratory status: spontaneous breathing, nonlabored ventilation, respiratory function stable and patient connected to nasal cannula oxygen Cardiovascular status: blood pressure returned to baseline and stable Postop Assessment: no signs of nausea or vomiting Anesthetic complications: no       Last Vitals:  Vitals:   10/08/16 0900 10/08/16 0930  BP: 115/66 128/75  Pulse: 88 93  Resp: 15 15  Temp:      Last Pain:  Vitals:   10/08/16 1000  TempSrc:   PainSc: 5                  Ily Denno

## 2016-10-09 ENCOUNTER — Encounter (HOSPITAL_COMMUNITY): Payer: Self-pay | Admitting: Vascular Surgery

## 2016-10-10 DIAGNOSIS — D509 Iron deficiency anemia, unspecified: Secondary | ICD-10-CM | POA: Insufficient documentation

## 2016-10-13 ENCOUNTER — Encounter (HOSPITAL_COMMUNITY): Payer: Self-pay | Admitting: Vascular Surgery

## 2016-10-13 NOTE — OR Nursing (Signed)
Late entry due to typing error.

## 2016-10-19 DIAGNOSIS — Z23 Encounter for immunization: Secondary | ICD-10-CM | POA: Insufficient documentation

## 2016-10-24 ENCOUNTER — Telehealth: Payer: Self-pay | Admitting: *Deleted

## 2016-10-24 ENCOUNTER — Other Ambulatory Visit: Payer: Self-pay | Admitting: Family Medicine

## 2016-10-24 MED ORDER — INSULIN GLARGINE 100 UNIT/ML ~~LOC~~ SOLN: 25.0000 [IU] | Freq: Two times a day (BID) | SUBCUTANEOUS | 11 refills | Status: DC

## 2016-10-24 MED ORDER — LEVOTHYROXINE SODIUM 150 MCG PO TABS: 150.0000 ug | ORAL_TABLET | Freq: Every day | ORAL | 0 refills | Status: DC

## 2016-10-24 MED ORDER — CALCIUM ACETATE (PHOS BINDER) 667 MG PO CAPS: 1334.0000 mg | ORAL_CAPSULE | Freq: Three times a day (TID) | ORAL | 0 refills | Status: DC

## 2016-10-24 MED ORDER — RENA-VITE PO TABS: 1.0000 | ORAL_TABLET | Freq: Every day | ORAL | 0 refills | Status: DC

## 2016-10-24 NOTE — Telephone Encounter (Signed)
Needs refill on calcuim and rena vit.  [facility]

## 2016-10-24 NOTE — Telephone Encounter (Signed)
Received fax from [facility] needing a clarification on patient's Lantus directions.  Directions are written as: Inject 0.25 mLs (25 Units total) into the skin 2 (two) times daily. Inject 50 u of insulin into the skin BID. Please advise or send in new Rx to [facility].  Derl Barrow, RN

## 2016-10-24 NOTE — Addendum Note (Signed)
Addendum  created 10/24/16 1541 by Oleta Mouse, MD   Sign clinical note

## 2016-10-24 NOTE — Telephone Encounter (Signed)
He is currently taking 25 units BID. That is what the Rx should say for the Lantus.   Phill Myron, D.O. 10/24/2016, 4:28 PM PGY-2, [place]

## 2016-10-25 ENCOUNTER — Other Ambulatory Visit: Payer: Self-pay | Admitting: Internal Medicine

## 2016-10-25 ENCOUNTER — Encounter (HOSPITAL_BASED_OUTPATIENT_CLINIC_OR_DEPARTMENT_OTHER): Payer: Medicaid Other | Attending: Surgery

## 2016-10-25 DIAGNOSIS — Z992 Dependence on renal dialysis: Secondary | ICD-10-CM | POA: Insufficient documentation

## 2016-10-25 DIAGNOSIS — L89323 Pressure ulcer of left buttock, stage 3: Secondary | ICD-10-CM | POA: Diagnosis not present

## 2016-10-25 DIAGNOSIS — I120 Hypertensive chronic kidney disease with stage 5 chronic kidney disease or end stage renal disease: Secondary | ICD-10-CM | POA: Insufficient documentation

## 2016-10-25 DIAGNOSIS — E11622 Type 2 diabetes mellitus with other skin ulcer: Secondary | ICD-10-CM | POA: Diagnosis not present

## 2016-10-25 DIAGNOSIS — L89322 Pressure ulcer of left buttock, stage 2: Secondary | ICD-10-CM | POA: Diagnosis not present

## 2016-10-25 DIAGNOSIS — Z6841 Body Mass Index (BMI) 40.0 and over, adult: Secondary | ICD-10-CM | POA: Insufficient documentation

## 2016-10-25 DIAGNOSIS — L928 Other granulomatous disorders of the skin and subcutaneous tissue: Secondary | ICD-10-CM | POA: Diagnosis not present

## 2016-10-25 DIAGNOSIS — N186 End stage renal disease: Secondary | ICD-10-CM | POA: Insufficient documentation

## 2016-10-25 DIAGNOSIS — E1122 Type 2 diabetes mellitus with diabetic chronic kidney disease: Secondary | ICD-10-CM | POA: Diagnosis not present

## 2016-10-25 DIAGNOSIS — L89313 Pressure ulcer of right buttock, stage 3: Secondary | ICD-10-CM | POA: Insufficient documentation

## 2016-10-25 MED ORDER — RENA-VITE PO TABS: 1.0000 | ORAL_TABLET | Freq: Every day | ORAL | 0 refills | Status: DC

## 2016-10-25 MED ORDER — CALCIUM ACETATE (PHOS BINDER) 667 MG PO CAPS: 1334.0000 mg | ORAL_CAPSULE | Freq: Three times a day (TID) | ORAL | 0 refills | Status: DC

## 2016-10-25 MED FILL — RENA-VITE TABLET: 30 days supply | Qty: 30 | Fill #0

## 2016-10-25 MED FILL — CALCIUM ACETATE (PHOS BINDE: 667 | 15 days supply | Qty: 90 | Fill #0

## 2016-10-25 NOTE — Telephone Encounter (Signed)
Pt mother informed of below. Katharina Caper, Blane Worthington D, [place]

## 2016-10-25 NOTE — Telephone Encounter (Signed)
I have sent Rx to [facility] per request. Please have patient send further requests for these medications to the nephrology office.   Phill Myron, D.O. 10/25/2016, 1:43 PM PGY-2, [place]

## 2016-10-25 NOTE — Telephone Encounter (Signed)
Mother called and would like the doctor to send a prescription for the rena-Vit [facility] since [facility] seems to be the only place where she get this. jw

## 2016-10-29 DIAGNOSIS — R519 Headache, unspecified: Secondary | ICD-10-CM | POA: Insufficient documentation

## 2016-11-02 DIAGNOSIS — E11622 Type 2 diabetes mellitus with other skin ulcer: Secondary | ICD-10-CM | POA: Diagnosis not present

## 2016-11-04 ENCOUNTER — Encounter: Payer: Self-pay | Admitting: Vascular Surgery

## 2016-11-10 ENCOUNTER — Encounter: Payer: Self-pay | Admitting: *Deleted

## 2016-11-10 ENCOUNTER — Other Ambulatory Visit: Payer: Self-pay

## 2016-11-10 DIAGNOSIS — N179 Acute kidney failure, unspecified: Secondary | ICD-10-CM

## 2016-11-11 DIAGNOSIS — E46 Unspecified protein-calorie malnutrition: Secondary | ICD-10-CM | POA: Insufficient documentation

## 2016-11-16 ENCOUNTER — Ambulatory Visit (HOSPITAL_COMMUNITY)
Admission: RE | Admit: 2016-11-16 | Discharge: 2016-11-16 | Disposition: A | Discharge status: Home / Self Care | Payer: Medicaid Other | Source: Ambulatory Visit | Attending: Vascular Surgery | Admitting: Vascular Surgery

## 2016-11-16 DIAGNOSIS — Z9889 Other specified postprocedural states: Secondary | ICD-10-CM | POA: Diagnosis not present

## 2016-11-16 DIAGNOSIS — N179 Acute kidney failure, unspecified: Secondary | ICD-10-CM

## 2016-11-16 DIAGNOSIS — E11622 Type 2 diabetes mellitus with other skin ulcer: Secondary | ICD-10-CM | POA: Diagnosis not present

## 2016-11-17 ENCOUNTER — Encounter: Payer: Self-pay | Admitting: Vascular Surgery

## 2016-11-17 ENCOUNTER — Ambulatory Visit (INDEPENDENT_AMBULATORY_CARE_PROVIDER_SITE_OTHER): Payer: Self-pay | Admitting: Vascular Surgery

## 2016-11-17 VITALS — BP 139/95 | HR 96 | Temp 97.4°F | Resp 18 | Ht 74.0 in | Wt 342.5 lb

## 2016-11-17 DIAGNOSIS — Z992 Dependence on renal dialysis: Secondary | ICD-10-CM

## 2016-11-17 DIAGNOSIS — N186 End stage renal disease: Secondary | ICD-10-CM

## 2016-11-17 NOTE — Progress Notes (Signed)
Patient is a 26 year old male who returns for follow-up today after recent placement of a left brachiocephalic AV fistula. He is currently dialyzing via a right-sided catheter. He denies any numbness or tingling in his left hand.  Physical exam:  Vitals:   11/17/16 1104  BP: (!) 139/95  Pulse: 96  Resp: 18  Temp: 97.4 F (36.3 C)  TempSrc: Oral  SpO2: 100%  Weight: (!) 342 lb 8 oz (155.4 kg)  Height: 6\' 2"  (1.88 m)    Extremities: Well-healed antecubital incision no audible bruit or palpable thrill.  Data: Duplex ultrasound shows occlusion of the left brachiocephalic AV fistula.  Assessment: Occluded left brachiocephalic AV fistula.  Plan: Patient will need a new long-term hemodialysis access. Currently has dialysis today is Tuesday Thursday Saturday. We will schedule him for a left upper arm graft 11/28/2016. Risks benefits possible complications and procedure details including but not limited to bleeding infection graft thrombosis 25% ischemic steal all were discussed patient today. He understands and agrees to proceed.  Ruta Hinds, MD [facility] [place] Office: [phone] Pager: [phone]

## 2016-11-21 ENCOUNTER — Other Ambulatory Visit: Payer: Self-pay

## 2016-11-21 ENCOUNTER — Other Ambulatory Visit: Payer: Self-pay | Admitting: Internal Medicine

## 2016-11-21 MED ORDER — AMLODIPINE BESYLATE 5 MG PO TABS: 5.0000 mg | ORAL_TABLET | Freq: Every day | ORAL | 3 refills | Status: DC

## 2016-11-21 MED ORDER — ROSUVASTATIN CALCIUM 20 MG PO TABS: 20.0000 mg | ORAL_TABLET | Freq: Every day | ORAL | 3 refills | Status: DC

## 2016-11-21 MED ORDER — GLUCOSE BLOOD VI STRP: ORAL_STRIP | 12 refills | Status: DC

## 2016-11-21 MED ORDER — AMLODIPINE BESYLATE 5 MG PO TABS: 5.0000 mg | ORAL_TABLET | Freq: Every day | ORAL | 0 refills | Status: DC

## 2016-11-21 NOTE — Telephone Encounter (Signed)
Have sent medications to [facility] as requested.   Phill Myron, D.O. 11/21/2016, 3:11 PM PGY-3, [place]

## 2016-11-21 NOTE — Telephone Encounter (Signed)
Mother is calling to request refill of:  Name of Medication(s):Crestor, Amlodipine, and his test strips Last date of 10/06/2016  [facility]  [facility]  Will route refill request to Clinic RN.  Discussed with patient policy to call pharmacy for future refills.  Also, discussed refills may take up to 48 hours to approve or deny.  Areatha Keas

## 2016-11-21 NOTE — Telephone Encounter (Signed)
Mother is calling back because her son now has Medicaid and use the [address] for his pharmacy. jw

## 2016-11-21 NOTE — Telephone Encounter (Signed)
Mom informed. Ottis Stain, CMA

## 2016-11-25 ENCOUNTER — Encounter (HOSPITAL_COMMUNITY): Payer: Self-pay | Admitting: *Deleted

## 2016-11-25 NOTE — Progress Notes (Signed)
Spoke with pt for pre-op call. Pt denies cardiac history, chest pain or sob. Pt is a type 2 diabetic, last A1C was 5.5 on 09/17/16. Pt states his fasting blood sugar is usually between 110-120. Pt instructed to take 1/2 of his regular dose of Lantus Sunday PM by Dr. Oneida Alar' office and no Lantus the morning of surgery. Instructed pt to check his blood sugar when he gets up Monday AM and every 2 hours until he leaves for the hospital. If blood sugar is 70 or below, treat with 1/2 cup of clear juice (apple or cranberry) and recheck blood sugar 15 minutes after drinking juice. If blood sugar continues to be 70 or below, call the Short Stay department and ask to speak to a nurse. Pt voiced understanding.

## 2016-11-28 ENCOUNTER — Ambulatory Visit (HOSPITAL_COMMUNITY)
Admission: RE | Admit: 2016-11-28 | Discharge: 2016-11-28 | Disposition: A | Discharge status: Home / Self Care | Payer: Medicaid Other | Source: Ambulatory Visit | Attending: Vascular Surgery | Admitting: Vascular Surgery

## 2016-11-28 ENCOUNTER — Ambulatory Visit (HOSPITAL_COMMUNITY): Payer: Medicaid Other | Admitting: Certified Registered Nurse Anesthetist

## 2016-11-28 ENCOUNTER — Encounter (HOSPITAL_COMMUNITY)
Admission: RE | Disposition: A | Discharge status: Home / Self Care | Payer: Self-pay | Source: Ambulatory Visit | Attending: Vascular Surgery

## 2016-11-28 ENCOUNTER — Encounter (HOSPITAL_COMMUNITY): Payer: Self-pay | Admitting: Certified Registered Nurse Anesthetist

## 2016-11-28 ENCOUNTER — Other Ambulatory Visit: Payer: Self-pay | Admitting: Internal Medicine

## 2016-11-28 ENCOUNTER — Other Ambulatory Visit: Payer: Self-pay | Admitting: *Deleted

## 2016-11-28 DIAGNOSIS — Y832 Surgical operation with anastomosis, bypass or graft as the cause of abnormal reaction of the patient, or of later complication, without mention of misadventure at the time of the procedure: Secondary | ICD-10-CM | POA: Insufficient documentation

## 2016-11-28 DIAGNOSIS — Z992 Dependence on renal dialysis: Secondary | ICD-10-CM | POA: Diagnosis not present

## 2016-11-28 DIAGNOSIS — T82868A Thrombosis of vascular prosthetic devices, implants and grafts, initial encounter: Secondary | ICD-10-CM | POA: Diagnosis not present

## 2016-11-28 DIAGNOSIS — Z5309 Procedure and treatment not carried out because of other contraindication: Secondary | ICD-10-CM | POA: Diagnosis not present

## 2016-11-28 HISTORY — DX: Anemia, unspecified: D64.9

## 2016-11-28 LAB — BASIC METABOLIC PANEL WITH GFR
Anion gap: 12 (ref 5–15)
BUN: 47 mg/dL — ABNORMAL HIGH (ref 6–20)
CO2: 24 mmol/L (ref 22–32)
Calcium: 8.3 mg/dL — ABNORMAL LOW (ref 8.9–10.3)
Chloride: 104 mmol/L (ref 101–111)
Creatinine, Ser: 9.76 mg/dL — ABNORMAL HIGH (ref 0.61–1.24)
GFR calc Af Amer: 8 mL/min — ABNORMAL LOW
GFR calc non Af Amer: 7 mL/min — ABNORMAL LOW
Glucose, Bld: 86 mg/dL (ref 65–99)
Potassium: 6.4 mmol/L (ref 3.5–5.1)
Sodium: 140 mmol/L (ref 135–145)

## 2016-11-28 LAB — GLUCOSE, CAPILLARY
GLUCOSE-CAPILLARY: 93 mg/dL (ref 65–99)
Glucose-Capillary: 83 mg/dL (ref 65–99)

## 2016-11-28 LAB — POCT I-STAT 4, (NA,K, GLUC, HGB,HCT)
Glucose, Bld: 88 mg/dL (ref 65–99)
HCT: 31 % — ABNORMAL LOW (ref 39.0–52.0)
HEMOGLOBIN: 10.5 g/dL — AB (ref 13.0–17.0)
POTASSIUM: 6.6 mmol/L — AB (ref 3.5–5.1)
SODIUM: 138 mmol/L (ref 135–145)

## 2016-11-28 SURGERY — CANCELLED PROCEDURE
Site: Arm Upper | Laterality: Left

## 2016-11-28 MED ORDER — FENTANYL CITRATE (PF) 250 MCG/5ML IJ SOLN
INTRAMUSCULAR | Status: AC
  Filled 2016-11-28: qty 5

## 2016-11-28 MED ORDER — HEPARIN SODIUM (PORCINE) 1000 UNIT/ML IJ SOLN
INTRAMUSCULAR | Status: AC
  Filled 2016-11-28: qty 1

## 2016-11-28 MED ORDER — SODIUM CHLORIDE 0.9 % IV SOLN
INTRAVENOUS | Status: DC
  Administered 2016-11-28: 09:00:00 via INTRAVENOUS

## 2016-11-28 MED ORDER — MIDAZOLAM HCL 2 MG/2ML IJ SOLN
INTRAMUSCULAR | Status: AC
Start: 2016-11-28 — End: ?
  Filled 2016-11-28: qty 2

## 2016-11-28 MED ORDER — CHLORHEXIDINE GLUCONATE CLOTH 2 % EX PADS: 6.0000 | MEDICATED_PAD | Freq: Once | CUTANEOUS | Status: DC

## 2016-11-28 MED ORDER — EPHEDRINE 5 MG/ML INJ
INTRAVENOUS | Status: AC
  Filled 2016-11-28: qty 10

## 2016-11-28 MED ORDER — PROPOFOL 10 MG/ML IV BOLUS
INTRAVENOUS | Status: AC
  Filled 2016-11-28: qty 20

## 2016-11-28 MED ORDER — DEXTROSE 5 % IV SOLN
1.5000 g | INTRAVENOUS | Status: DC
  Filled 2016-11-28: qty 1.5

## 2016-11-28 MED ORDER — PHENYLEPHRINE 40 MCG/ML (10ML) SYRINGE FOR IV PUSH (FOR BLOOD PRESSURE SUPPORT)
PREFILLED_SYRINGE | INTRAVENOUS | Status: AC
  Filled 2016-11-28: qty 10

## 2016-11-28 MED ORDER — SODIUM POLYSTYRENE SULFONATE 15 GM/60ML PO SUSP
60.0000 g | Freq: Once | ORAL | Status: DC
  Filled 2016-11-28: qty 240

## 2016-11-28 MED ORDER — HEPARIN SODIUM (PORCINE) 5000 UNIT/ML IJ SOLN: INTRAMUSCULAR | Status: DC | PRN

## 2016-11-28 MED ORDER — LIDOCAINE HCL (CARDIAC) 20 MG/ML IV SOLN
INTRAVENOUS | Status: AC
  Filled 2016-11-28: qty 5

## 2016-11-28 MED ORDER — SUCCINYLCHOLINE CHLORIDE 200 MG/10ML IV SOSY
PREFILLED_SYRINGE | INTRAVENOUS | Status: AC
  Filled 2016-11-28: qty 10

## 2016-11-28 MED ORDER — LIDOCAINE-EPINEPHRINE (PF) 1 %-1:200000 IJ SOLN
INTRAMUSCULAR | Status: AC
  Filled 2016-11-28: qty 30

## 2016-11-28 MED ORDER — MIDAZOLAM HCL 2 MG/2ML IJ SOLN
INTRAMUSCULAR | Status: AC
  Filled 2016-11-28: qty 2

## 2016-11-28 MED ORDER — 0.9 % SODIUM CHLORIDE (POUR BTL) OPTIME: TOPICAL | Status: DC | PRN

## 2016-11-28 MED ORDER — ROCURONIUM BROMIDE 50 MG/5ML IV SOLN
INTRAVENOUS | Status: AC
  Filled 2016-11-28: qty 1

## 2016-11-28 NOTE — Progress Notes (Signed)
Pt. D/C'd home, with family.  Pt. Given instructions to return at [facility] on 12/02/2016, for a 0730 surgery. Pt. Given a name & phone no. for reference to [facility].

## 2016-11-28 NOTE — Progress Notes (Signed)
Report to Dr. Donnetta Hutching regarding the K+ on pt.

## 2016-11-28 NOTE — Anesthesia Preprocedure Evaluation (Addendum)
Anesthesia Evaluation  Patient identified by MRN, date of birth, ID band Patient awake    Reviewed: Allergy & Precautions, NPO status , Patient's Chart, lab work & pertinent test results  Airway Mallampati: II  TM Distance: >3 FB Neck ROM: Full    Dental  (+) Teeth Intact, Dental Advisory Given   Pulmonary    breath sounds clear to auscultation       Cardiovascular hypertension,  Rhythm:Regular Rate:Normal     Neuro/Psych    GI/Hepatic   Endo/Other  diabetes  Renal/GU      Musculoskeletal   Abdominal (+) + obese,   Peds  Hematology   Anesthesia Other Findings   Reproductive/Obstetrics                            Anesthesia Physical Anesthesia Plan  ASA: III  Anesthesia Plan: General   Post-op Pain Management:    Induction: Intravenous  PONV Risk Score and Plan: Ondansetron  Airway Management Planned: Oral ETT  Additional Equipment:   Intra-op Plan:   Post-operative Plan: Extubation in OR  Informed Consent: I have reviewed the patients History and Physical, chart, labs and discussed the procedure including the risks, benefits and alternatives for the proposed anesthesia with the patient or authorized representative who has indicated his/her understanding and acceptance.   Dental advisory given  Plan Discussed with: CRNA and Anesthesiologist  Anesthesia Plan Comments:        Anesthesia Quick Evaluation

## 2016-11-28 NOTE — Progress Notes (Addendum)
K+ elevated -Dr. Linna Caprice aware.

## 2016-11-28 NOTE — H&P (View-Only) (Signed)
Patient is a 26 year old male who returns for follow-up today after recent placement of a left brachiocephalic AV fistula. He is currently dialyzing via a right-sided catheter. He denies any numbness or tingling in his left hand.  Physical exam:  Vitals:   11/17/16 1104  BP: (!) 139/95  Pulse: 96  Resp: 18  Temp: 97.4 F (36.3 C)  TempSrc: Oral  SpO2: 100%  Weight: (!) 342 lb 8 oz (155.4 kg)  Height: 6\' 2"  (1.88 m)    Extremities: Well-healed antecubital incision no audible bruit or palpable thrill.  Data: Duplex ultrasound shows occlusion of the left brachiocephalic AV fistula.  Assessment: Occluded left brachiocephalic AV fistula.  Plan: Patient will need a new long-term hemodialysis access. Currently has dialysis today is Tuesday Thursday Saturday. We will schedule him for a left upper arm graft 11/28/2016. Risks benefits possible complications and procedure details including but not limited to bleeding infection graft thrombosis 25% ischemic steal all were discussed patient today. He understands and agrees to proceed.  Ruta Hinds, MD [facility] [place] Office: [phone] Pager: [phone]

## 2016-11-28 NOTE — Progress Notes (Signed)
Hovermat on pt.'s bed.

## 2016-11-28 NOTE — Interval H&P Note (Signed)
History and Physical Interval Note:  11/28/2016 8:50 AM  Paul [name]  has presented today for surgery, with the diagnosis of End Stage Renal Disease  N18.6  The various methods of treatment have been discussed with the patient and family. After consideration of risks, benefits and other options for treatment, the patient has consented to  Procedure(s): INSERTION OF ARTERIOVENOUS (AV) GORE-TEX GRAFT ARM (Left) as a surgical intervention .  The patient's history has been reviewed, patient examined, no change in status, stable for surgery.  I have reviewed the patient's chart and labs.  Questions were answered to the patient's satisfaction.     Curt Jews

## 2016-11-28 NOTE — Progress Notes (Signed)
Patient was found to have potassium of 6.5. This was confirmed by repeat. Discussed with Dr.Shertz from nephrology service. Will give 60 g of Kayexalate and have him present to normal dialysis unit tomorrow. Will reschedule left arm AV graft. Surgery canceled for today.

## 2016-11-28 NOTE — Telephone Encounter (Signed)
Mother called because the pharmacy is saying that we did not send in test strips. She has called them 3 times, Epic shows that we did send this in. Can we re-send. jw

## 2016-11-28 NOTE — Progress Notes (Signed)
Pt. Consumed Kayexelate in 5 mins., total of 274mls. Pt. Given water to follow - ok'd by [employer]. Danise Mina.

## 2016-11-29 MED ORDER — GLUCOSE BLOOD VI STRP: ORAL_STRIP | 12 refills | Status: DC

## 2016-11-30 ENCOUNTER — Encounter (HOSPITAL_BASED_OUTPATIENT_CLINIC_OR_DEPARTMENT_OTHER): Payer: Medicaid Other | Attending: Surgery

## 2016-11-30 DIAGNOSIS — Z6841 Body Mass Index (BMI) 40.0 and over, adult: Secondary | ICD-10-CM | POA: Insufficient documentation

## 2016-11-30 DIAGNOSIS — Z992 Dependence on renal dialysis: Secondary | ICD-10-CM | POA: Insufficient documentation

## 2016-11-30 DIAGNOSIS — N186 End stage renal disease: Secondary | ICD-10-CM | POA: Insufficient documentation

## 2016-11-30 DIAGNOSIS — E1122 Type 2 diabetes mellitus with diabetic chronic kidney disease: Secondary | ICD-10-CM | POA: Insufficient documentation

## 2016-11-30 DIAGNOSIS — I120 Hypertensive chronic kidney disease with stage 5 chronic kidney disease or end stage renal disease: Secondary | ICD-10-CM | POA: Insufficient documentation

## 2016-11-30 DIAGNOSIS — Z794 Long term (current) use of insulin: Secondary | ICD-10-CM | POA: Insufficient documentation

## 2016-11-30 DIAGNOSIS — L89313 Pressure ulcer of right buttock, stage 3: Secondary | ICD-10-CM | POA: Insufficient documentation

## 2016-11-30 DIAGNOSIS — Z79899 Other long term (current) drug therapy: Secondary | ICD-10-CM | POA: Insufficient documentation

## 2016-11-30 DIAGNOSIS — L89322 Pressure ulcer of left buttock, stage 2: Secondary | ICD-10-CM | POA: Insufficient documentation

## 2016-11-30 NOTE — Progress Notes (Signed)
Spoke with pt's mom for pre-op call. Just talked with her on 11/25/16 for surgery that was cancelled on Monday. Gave pre-op orders only. Mom is aware of reducing Lantus by 1/2 night before surgery and checking blood sugar in AM day of surgery.

## 2016-12-01 ENCOUNTER — Other Ambulatory Visit: Payer: Self-pay | Admitting: Internal Medicine

## 2016-12-01 MED ORDER — GLUCOSE BLOOD VI STRP: ORAL_STRIP | 3 refills | Status: DC

## 2016-12-01 MED ORDER — ACCU-CHEK FASTCLIX LANCETS MISC: 1.0000 | Freq: Three times a day (TID) | 3 refills | Status: DC

## 2016-12-01 MED ORDER — ACCU-CHEK AVIVA PLUS W/DEVICE KIT: 1.0000 | PACK | Freq: Every day | 1 refills | Status: DC

## 2016-12-01 NOTE — Telephone Encounter (Signed)
Prescriptions sent

## 2016-12-01 NOTE — Telephone Encounter (Signed)
Pharmacy states insurance will not cover [employer] diabetic supplies. Insurance will cover Accu check, so pt needs meter, test strips, and lancets sent to [facility] on [address]

## 2016-12-02 ENCOUNTER — Encounter (HOSPITAL_COMMUNITY): Payer: Self-pay

## 2016-12-02 ENCOUNTER — Encounter (HOSPITAL_COMMUNITY)
Admission: RE | Disposition: A | Discharge status: Home / Self Care | Payer: Self-pay | Source: Ambulatory Visit | Attending: Vascular Surgery

## 2016-12-02 ENCOUNTER — Telehealth: Payer: Self-pay | Admitting: *Deleted

## 2016-12-02 ENCOUNTER — Ambulatory Visit (HOSPITAL_COMMUNITY): Payer: Medicaid Other | Admitting: Certified Registered Nurse Anesthetist

## 2016-12-02 ENCOUNTER — Ambulatory Visit (HOSPITAL_COMMUNITY)
Admission: RE | Admit: 2016-12-02 | Discharge: 2016-12-02 | Disposition: A | Discharge status: Home / Self Care | Payer: Medicaid Other | Source: Ambulatory Visit | Attending: Vascular Surgery | Admitting: Vascular Surgery

## 2016-12-02 DIAGNOSIS — Y832 Surgical operation with anastomosis, bypass or graft as the cause of abnormal reaction of the patient, or of later complication, without mention of misadventure at the time of the procedure: Secondary | ICD-10-CM | POA: Insufficient documentation

## 2016-12-02 DIAGNOSIS — Z6841 Body Mass Index (BMI) 40.0 and over, adult: Secondary | ICD-10-CM | POA: Diagnosis not present

## 2016-12-02 DIAGNOSIS — Z794 Long term (current) use of insulin: Secondary | ICD-10-CM | POA: Diagnosis not present

## 2016-12-02 DIAGNOSIS — Z992 Dependence on renal dialysis: Secondary | ICD-10-CM

## 2016-12-02 DIAGNOSIS — I120 Hypertensive chronic kidney disease with stage 5 chronic kidney disease or end stage renal disease: Secondary | ICD-10-CM | POA: Insufficient documentation

## 2016-12-02 DIAGNOSIS — E1122 Type 2 diabetes mellitus with diabetic chronic kidney disease: Secondary | ICD-10-CM | POA: Insufficient documentation

## 2016-12-02 DIAGNOSIS — E039 Hypothyroidism, unspecified: Secondary | ICD-10-CM | POA: Diagnosis not present

## 2016-12-02 DIAGNOSIS — T82590A Other mechanical complication of surgically created arteriovenous fistula, initial encounter: Secondary | ICD-10-CM | POA: Insufficient documentation

## 2016-12-02 DIAGNOSIS — N186 End stage renal disease: Secondary | ICD-10-CM | POA: Insufficient documentation

## 2016-12-02 DIAGNOSIS — Z79899 Other long term (current) drug therapy: Secondary | ICD-10-CM | POA: Insufficient documentation

## 2016-12-02 HISTORY — PX: AV FISTULA PLACEMENT: SHX1204

## 2016-12-02 LAB — GLUCOSE, CAPILLARY: Glucose-Capillary: 100 mg/dL — ABNORMAL HIGH (ref 65–99)

## 2016-12-02 LAB — POCT I-STAT 4, (NA,K, GLUC, HGB,HCT)
GLUCOSE: 86 mg/dL (ref 65–99)
HCT: 34 % — ABNORMAL LOW (ref 39.0–52.0)
HEMOGLOBIN: 11.6 g/dL — AB (ref 13.0–17.0)
POTASSIUM: 4.6 mmol/L (ref 3.5–5.1)
Sodium: 140 mmol/L (ref 135–145)

## 2016-12-02 SURGERY — INSERTION OF ARTERIOVENOUS (AV) GORE-TEX GRAFT ARM
Anesthesia: General | Site: Arm Upper | Laterality: Left

## 2016-12-02 MED ORDER — PROPOFOL 10 MG/ML IV BOLUS
INTRAVENOUS | Status: DC | PRN
  Administered 2016-12-02 (×2): 200 mg via INTRAVENOUS
  Administered 2016-12-02: 50 mg via INTRAVENOUS

## 2016-12-02 MED ORDER — ONDANSETRON HCL 4 MG/2ML IJ SOLN
INTRAMUSCULAR | Status: DC | PRN
  Administered 2016-12-02: 4 mg via INTRAVENOUS

## 2016-12-02 MED ORDER — FENTANYL CITRATE (PF) 100 MCG/2ML IJ SOLN: 25.0000 ug | INTRAMUSCULAR | Status: DC | PRN

## 2016-12-02 MED ORDER — ONDANSETRON HCL 4 MG/2ML IJ SOLN: 4.0000 mg | Freq: Once | INTRAMUSCULAR | Status: DC | PRN

## 2016-12-02 MED ORDER — PROPOFOL 10 MG/ML IV BOLUS
INTRAVENOUS | Status: AC
  Filled 2016-12-02: qty 20

## 2016-12-02 MED ORDER — FENTANYL CITRATE (PF) 250 MCG/5ML IJ SOLN
INTRAMUSCULAR | Status: AC
  Filled 2016-12-02: qty 5

## 2016-12-02 MED ORDER — LIDOCAINE 2% (20 MG/ML) 5 ML SYRINGE
INTRAMUSCULAR | Status: DC | PRN
  Administered 2016-12-02: 100 mg via INTRAVENOUS

## 2016-12-02 MED ORDER — OXYCODONE HCL 5 MG/5ML PO SOLN: 5.0000 mg | Freq: Once | ORAL | Status: AC | PRN

## 2016-12-02 MED ORDER — SUCCINYLCHOLINE CHLORIDE 200 MG/10ML IV SOSY
PREFILLED_SYRINGE | INTRAVENOUS | Status: DC | PRN
  Administered 2016-12-02: 160 mg via INTRAVENOUS

## 2016-12-02 MED ORDER — PHENYLEPHRINE 40 MCG/ML (10ML) SYRINGE FOR IV PUSH (FOR BLOOD PRESSURE SUPPORT)
PREFILLED_SYRINGE | INTRAVENOUS | Status: AC
  Filled 2016-12-02: qty 10

## 2016-12-02 MED ORDER — DEXAMETHASONE SODIUM PHOSPHATE 10 MG/ML IJ SOLN
INTRAMUSCULAR | Status: AC
  Filled 2016-12-02: qty 1

## 2016-12-02 MED ORDER — ONDANSETRON HCL 4 MG/2ML IJ SOLN
INTRAMUSCULAR | Status: AC
  Filled 2016-12-02: qty 2

## 2016-12-02 MED ORDER — OXYCODONE HCL 5 MG PO TABS
5.0000 mg | ORAL_TABLET | Freq: Once | ORAL | Status: AC | PRN
  Administered 2016-12-02: 5 mg via ORAL

## 2016-12-02 MED ORDER — ESMOLOL HCL 100 MG/10ML IV SOLN
INTRAVENOUS | Status: AC
  Filled 2016-12-02: qty 10

## 2016-12-02 MED ORDER — SODIUM CHLORIDE 0.9 % IV SOLN
INTRAVENOUS | Status: DC | PRN
  Administered 2016-12-02: 11:00:00 500 mL

## 2016-12-02 MED ORDER — ACETAMINOPHEN 160 MG/5ML PO SOLN: 325.0000 mg | ORAL | Status: DC | PRN

## 2016-12-02 MED ORDER — SODIUM CHLORIDE 0.9 % IV SOLN
INTRAVENOUS | Status: DC
  Administered 2016-12-02: 09:00:00 via INTRAVENOUS

## 2016-12-02 MED ORDER — DEXTROSE 5 % IV SOLN
1.5000 g | INTRAVENOUS | Status: AC
  Administered 2016-12-02: 1.5 g via INTRAVENOUS
  Filled 2016-12-02: qty 1.5

## 2016-12-02 MED ORDER — OXYCODONE HCL 5 MG PO TABS
ORAL_TABLET | ORAL | Status: AC
  Filled 2016-12-02: qty 1

## 2016-12-02 MED ORDER — ACETAMINOPHEN 325 MG PO TABS: 325.0000 mg | ORAL_TABLET | ORAL | Status: DC | PRN

## 2016-12-02 MED ORDER — FENTANYL CITRATE (PF) 250 MCG/5ML IJ SOLN
INTRAMUSCULAR | Status: DC | PRN
  Administered 2016-12-02 (×2): 50 ug via INTRAVENOUS
  Administered 2016-12-02: 150 ug via INTRAVENOUS

## 2016-12-02 MED ORDER — ACCU-CHEK SOFTCLIX LANCETS MISC: 3 refills | Status: DC

## 2016-12-02 MED ORDER — OXYCODONE-ACETAMINOPHEN 5-325 MG PO TABS: 1.0000 | ORAL_TABLET | Freq: Four times a day (QID) | ORAL | 0 refills | Status: DC | PRN

## 2016-12-02 MED ORDER — LIDOCAINE HCL (CARDIAC) 20 MG/ML IV SOLN
INTRAVENOUS | Status: AC
  Filled 2016-12-02: qty 5

## 2016-12-02 MED ORDER — DEXAMETHASONE SODIUM PHOSPHATE 10 MG/ML IJ SOLN
INTRAMUSCULAR | Status: DC | PRN
  Administered 2016-12-02: 10 mg via INTRAVENOUS

## 2016-12-02 MED ORDER — EPHEDRINE 5 MG/ML INJ
INTRAVENOUS | Status: AC
  Filled 2016-12-02: qty 10

## 2016-12-02 MED ORDER — MEPERIDINE HCL 25 MG/ML IJ SOLN: 6.2500 mg | INTRAMUSCULAR | Status: DC | PRN

## 2016-12-02 MED ORDER — MIDAZOLAM HCL 2 MG/2ML IJ SOLN
INTRAMUSCULAR | Status: AC
Start: 2016-12-02 — End: 2016-12-02
  Filled 2016-12-02: qty 2

## 2016-12-02 MED ORDER — ONDANSETRON HCL 4 MG/2ML IJ SOLN: 4.0000 mg | Freq: Four times a day (QID) | INTRAMUSCULAR | Status: DC | PRN

## 2016-12-02 MED ORDER — HYDROCODONE-ACETAMINOPHEN 7.5-325 MG PO TABS: 1.0000 | ORAL_TABLET | Freq: Once | ORAL | Status: DC | PRN

## 2016-12-02 MED ORDER — MIDAZOLAM HCL 2 MG/2ML IJ SOLN
INTRAMUSCULAR | Status: DC | PRN
  Administered 2016-12-02: 2 mg via INTRAVENOUS

## 2016-12-02 MED ORDER — 0.9 % SODIUM CHLORIDE (POUR BTL) OPTIME
TOPICAL | Status: DC | PRN
  Administered 2016-12-02: 1000 mL

## 2016-12-02 SURGICAL SUPPLY — 40 items
ARMBAND PINK RESTRICT EXTREMIT (MISCELLANEOUS) ×3 IMPLANT
CANISTER SUCT 3000ML PPV (MISCELLANEOUS) ×3 IMPLANT
CANNULA VESSEL 3MM 2 BLNT TIP (CANNULA) ×3 IMPLANT
CLIP LIGATING EXTRA MED SLVR (CLIP) ×3 IMPLANT
CLIP LIGATING EXTRA SM BLUE (MISCELLANEOUS) ×3 IMPLANT
COVER PROBE W GEL 5X96 (DRAPES) ×3 IMPLANT
DECANTER SPIKE VIAL GLASS SM (MISCELLANEOUS) ×3 IMPLANT
DERMABOND ADVANCED (GAUZE/BANDAGES/DRESSINGS) ×2
DERMABOND ADVANCED .7 DNX12 (GAUZE/BANDAGES/DRESSINGS) ×1 IMPLANT
ELECT REM PT RETURN 9FT ADLT (ELECTROSURGICAL) ×3
ELECTRODE REM PT RTRN 9FT ADLT (ELECTROSURGICAL) ×1 IMPLANT
GAUZE SPONGE 4X4 12PLY STRL (GAUZE/BANDAGES/DRESSINGS) ×3 IMPLANT
GLOVE BIOGEL PI IND STRL 6 (GLOVE) ×2 IMPLANT
GLOVE BIOGEL PI IND STRL 6.5 (GLOVE) ×2 IMPLANT
GLOVE BIOGEL PI IND STRL 7.5 (GLOVE) ×1 IMPLANT
GLOVE BIOGEL PI INDICATOR 6 (GLOVE) ×4
GLOVE BIOGEL PI INDICATOR 6.5 (GLOVE) ×4
GLOVE BIOGEL PI INDICATOR 7.5 (GLOVE) ×2
GLOVE ECLIPSE 7.0 STRL STRAW (GLOVE) ×3 IMPLANT
GLOVE ECLIPSE 7.5 STRL STRAW (GLOVE) ×3 IMPLANT
GLOVE SS BIOGEL STRL SZ 7.5 (GLOVE) ×1 IMPLANT
GLOVE SUPERSENSE BIOGEL SZ 7.5 (GLOVE) ×2
GLOVE SURG SS PI 6.5 STRL IVOR (GLOVE) ×6 IMPLANT
GOWN STRL REUS W/ TWL LRG LVL3 (GOWN DISPOSABLE) ×5 IMPLANT
GOWN STRL REUS W/ TWL XL LVL3 (GOWN DISPOSABLE) ×1 IMPLANT
GOWN STRL REUS W/TWL LRG LVL3 (GOWN DISPOSABLE) ×10
GOWN STRL REUS W/TWL XL LVL3 (GOWN DISPOSABLE) ×2
GRAFT GORETEX STRT 4-7X45 (Vascular Products) ×3 IMPLANT
KIT BASIN OR (CUSTOM PROCEDURE TRAY) ×3 IMPLANT
KIT ROOM TURNOVER OR (KITS) ×3 IMPLANT
NS IRRIG 1000ML POUR BTL (IV SOLUTION) ×3 IMPLANT
PACK CV ACCESS (CUSTOM PROCEDURE TRAY) ×3 IMPLANT
PAD ARMBOARD 7.5X6 YLW CONV (MISCELLANEOUS) ×6 IMPLANT
SUT PROLENE 6 0 CC (SUTURE) ×12 IMPLANT
SUT SILK 2 0 FS (SUTURE) IMPLANT
SUT VIC AB 3-0 SH 27 (SUTURE) ×4
SUT VIC AB 3-0 SH 27X BRD (SUTURE) ×2 IMPLANT
SYR TOOMEY 50ML (SYRINGE) IMPLANT
UNDERPAD 30X30 (UNDERPADS AND DIAPERS) ×3 IMPLANT
WATER STERILE IRR 1000ML POUR (IV SOLUTION) ×3 IMPLANT

## 2016-12-02 NOTE — H&P (View-Only) (Signed)
Patient is a 26 year old male who returns for follow-up today after recent placement of a left brachiocephalic AV fistula. He is currently dialyzing via a right-sided catheter. He denies any numbness or tingling in his left hand.  Physical exam:  Vitals:   11/17/16 1104  BP: (!) 139/95  Pulse: 96  Resp: 18  Temp: 97.4 F (36.3 C)  TempSrc: Oral  SpO2: 100%  Weight: (!) 342 lb 8 oz (155.4 kg)  Height: 6\' 2"  (1.88 m)    Extremities: Well-healed antecubital incision no audible bruit or palpable thrill.  Data: Duplex ultrasound shows occlusion of the left brachiocephalic AV fistula.  Assessment: Occluded left brachiocephalic AV fistula.  Plan: Patient will need a new long-term hemodialysis access. Currently has dialysis today is Tuesday Thursday Saturday. We will schedule him for a left upper arm graft 11/28/2016. Risks benefits possible complications and procedure details including but not limited to bleeding infection graft thrombosis 25% ischemic steal all were discussed patient today. He understands and agrees to proceed.  Ruta Hinds, MD [facility] [place] Office: [phone] Pager: [phone]

## 2016-12-02 NOTE — Telephone Encounter (Signed)
Received fax from [facility] stating patient has Accu-Chek Aviva Plus and need Softclix Lancets.  Rx changed to Softclix lancet from Fastclix lancets. Derl Barrow, RN

## 2016-12-02 NOTE — Transfer of Care (Signed)
Immediate Anesthesia Transfer of Care Note  Patient: Paul [name]  Procedure(s) Performed: Procedure(s): INSERTION OF ARTERIOVENOUS GORE-TEX GRAFT LEFT UPPER  ARM USING A 4-7MM BY 45CM GRAFT  (Left)  Patient Location: PACU  Anesthesia Type:General  Level of Consciousness: awake, alert , oriented and patient cooperative  Airway & Oxygen Therapy: Patient Spontanous Breathing and Patient connected to face mask oxygen  Post-op Assessment: Report given to RN, Post -op Vital signs reviewed and stable and Patient moving all extremities  Post vital signs: Reviewed and stable  Last Vitals:  Vitals:   12/02/16 0905  BP: 135/85  Pulse: 80  Resp: 18  Temp: 36.8 C    Last Pain: There were no vitals filed for this visit.       Complications: No apparent anesthesia complications

## 2016-12-02 NOTE — Progress Notes (Signed)
Pt has pre existing right upper chest Diatek-dsg CDI/ both ports capped & clamped.

## 2016-12-02 NOTE — Interval H&P Note (Signed)
History and Physical Interval Note:  12/02/2016 9:48 AM  Paul [name]  has presented today for surgery, with the diagnosis of chronic renal disease  The various methods of treatment have been discussed with the patient and family. After consideration of risks, benefits and other options for treatment, the patient has consented to  Procedure(s): INSERTION OF ARTERIOVENOUS (AV) GORE-TEX GRAFT ARM (Left) as a surgical intervention .  The patient's history has been reviewed, patient examined, no change in status, stable for surgery.  I have reviewed the patient's chart and labs.  Questions were answered to the patient's satisfaction.     Curt Jews

## 2016-12-02 NOTE — Anesthesia Procedure Notes (Signed)
Procedure Name: Intubation Date/Time: 12/02/2016 10:13 AM Performed by: Willeen Cass P Pre-anesthesia Checklist: Patient identified, Emergency Drugs available, Suction available and Patient being monitored Patient Re-evaluated:Patient Re-evaluated prior to induction Oxygen Delivery Method: Circle System Utilized Preoxygenation: Pre-oxygenation with 100% oxygen Induction Type: IV induction Ventilation: Mask ventilation without difficulty and Two handed mask ventilation required Laryngoscope Size: Glidescope and 4 Tube type: Oral Tube size: 7.5 mm Number of attempts: 1 Airway Equipment and Method: Video-laryngoscopy and Rigid stylet Placement Confirmation: ETT inserted through vocal cords under direct vision,  positive ETCO2 and breath sounds checked- equal and bilateral Secured at: 23 cm Tube secured with: Tape Dental Injury: Teeth and Oropharynx as per pre-operative assessment

## 2016-12-02 NOTE — Anesthesia Preprocedure Evaluation (Addendum)
Anesthesia Evaluation  Patient identified by MRN, date of birth, ID band Patient awake    Reviewed: Allergy & Precautions, H&P , NPO status , Patient's Chart, lab work & pertinent test results  Airway Mallampati: II   Neck ROM: full    Dental no notable dental hx. (+) Teeth Intact   Pulmonary neg pulmonary ROS,    Pulmonary exam normal breath sounds clear to auscultation       Cardiovascular hypertension, Normal cardiovascular exam Rhythm:regular Rate:Normal     Neuro/Psych    GI/Hepatic GERD  ,  Endo/Other  diabetes, Type 2, Insulin DependentHypothyroidism Morbid obesity  Renal/GU ESRF and DialysisRenal disease     Musculoskeletal   Abdominal (+) + obese,   Peds  Hematology   Anesthesia Other Findings   Reproductive/Obstetrics                            Anesthesia Physical Anesthesia Plan  ASA: III  Anesthesia Plan: General   Post-op Pain Management:    Induction: Intravenous  PONV Risk Score and Plan: 2 and Ondansetron, Dexamethasone and Treatment may vary due to age or medical condition  Airway Management Planned: LMA  Additional Equipment:   Intra-op Plan:   Post-operative Plan:   Informed Consent: I have reviewed the patients History and Physical, chart, labs and discussed the procedure including the risks, benefits and alternatives for the proposed anesthesia with the patient or authorized representative who has indicated his/her understanding and acceptance.     Plan Discussed with: CRNA, Anesthesiologist and Surgeon  Anesthesia Plan Comments:         Anesthesia Quick Evaluation                                   Anesthesia Evaluation  Patient identified by MRN, date of birth, ID band Patient awake    Reviewed: Allergy & Precautions, NPO status , Patient's Chart, lab work & pertinent test results  Airway Mallampati: II  TM Distance: >3 FB Neck ROM:  Full    Dental  (+) Teeth Intact, Dental Advisory Given   Pulmonary    breath sounds clear to auscultation       Cardiovascular hypertension,  Rhythm:Regular Rate:Normal     Neuro/Psych    GI/Hepatic   Endo/Other  diabetes  Renal/GU      Musculoskeletal   Abdominal (+) + obese,   Peds  Hematology   Anesthesia Other Findings   Reproductive/Obstetrics                            Anesthesia Physical Anesthesia Plan  ASA: III  Anesthesia Plan: General   Post-op Pain Management:    Induction: Intravenous  PONV Risk Score and Plan: Ondansetron  Airway Management Planned: Oral ETT  Additional Equipment:   Intra-op Plan:   Post-operative Plan: Extubation in OR  Informed Consent: I have reviewed the patients History and Physical, chart, labs and discussed the procedure including the risks, benefits and alternatives for the proposed anesthesia with the patient or authorized representative who has indicated his/her understanding and acceptance.   Dental advisory given  Plan Discussed with: CRNA and Anesthesiologist  Anesthesia Plan Comments:        Anesthesia Quick Evaluation

## 2016-12-02 NOTE — Op Note (Signed)
    OPERATIVE REPORT  DATE OF SURGERY: 12/02/2016  PATIENT: Paul [name], 26 y.o. male MRN: [id number]  DOB: Nov 05, 1990  PRE-OPERATIVE DIAGNOSIS: End-stage renal disease  POST-OPERATIVE DIAGNOSIS:  Same  PROCEDURE: Left upper arm loop AV Gore-Tex graft  SURGEON:  Curt Jews, M.D.  PHYSICIAN ASSISTANT: Gerri Lins PA-C  ANESTHESIA:  Gen.  EBL: Minimal ml  Total I/O In: -  Out: 30 [Blood:30]  BLOOD ADMINISTERED: None  DRAINS: None  SPECIMEN: None  COUNTS CORRECT:  YES  PLAN OF CARE: PACU   PATIENT DISPOSITION:  PACU - hemodynamically stable  PROCEDURE DETAILS: Patient was taken to the operative placed supine position where the area of the left arm left excellent reprepped and draped in usual sterile fashion. Patient had a prior left brachiocephalic fistula which failed. Incision was made over the axillary pulse and the vein and artery were exposed. Both of these were small to moderate size particularly in light of the gentleman is a morbid obesity. She felt that his best option would be for a left loop graft. Separate incision was made over the upper arm and a 4-7 tapered or take stretch graft was brought through the loop configuration tunnel. The axillary artery was occluded proximally and distally and was opened with 11 blade incision longitudinally with Potts scissors. The graft was spatulated and the 4 mm portion the graft was spatulated and sewn end-to-side to the artery with a running 6-0 Prolene. This anastomosis was tested and found to be adequate. Clamps were removed from the artery and were placed on the graft. The graft was flushed with heparinized saline and reoccluded. Next the axillary vein was occluded proximally and distally and was opened with 11 blade some ulcerative with Potts scissors. The graft was cut to appropriate length and was spatulated and the 7 mm portion was sewn end-to-side to the vein with clamps removed next or was noted. The patient maintained  2+ radial pulse. The wound irrigated with saline. Hemostasis cautery. Wounds were closed with 3-0 Vicryl in the subcutaneous and subcuticular tissue. Sterile dressing was applied the patient was transferred to the recovery room stable condition   Rosetta Posner, M.D., [facility] 12/02/2016 12:29 PM

## 2016-12-02 NOTE — Anesthesia Postprocedure Evaluation (Signed)
Anesthesia Post Note  Patient: Paul [name]  Procedure(s) Performed: Procedure(s) (LRB): INSERTION OF ARTERIOVENOUS GORE-TEX GRAFT LEFT UPPER  ARM USING A 4-7MM BY 45CM GRAFT  (Left)     Patient location during evaluation: PACU Anesthesia Type: General Level of consciousness: awake Pain management: pain level controlled Vital Signs Assessment: post-procedure vital signs reviewed and stable Respiratory status: spontaneous breathing Cardiovascular status: stable Postop Assessment: no signs of nausea or vomiting Anesthetic complications: no    Last Vitals:  Vitals:   12/02/16 1258 12/02/16 1304  BP: (!) 166/92 (!) 167/92  Pulse: 87 92  Resp: 16   Temp: 36.5 C     Last Pain:  Vitals:   12/02/16 1258  PainSc: 3    Pain Goal:                 Maxx Pham JR,JOHN Micheline Markes

## 2016-12-05 ENCOUNTER — Encounter (HOSPITAL_COMMUNITY): Payer: Self-pay | Admitting: Vascular Surgery

## 2016-12-05 ENCOUNTER — Ambulatory Visit: Payer: Medicaid Other | Admitting: Internal Medicine

## 2016-12-14 DIAGNOSIS — L89322 Pressure ulcer of left buttock, stage 2: Secondary | ICD-10-CM | POA: Diagnosis not present

## 2016-12-14 DIAGNOSIS — Z79899 Other long term (current) drug therapy: Secondary | ICD-10-CM | POA: Diagnosis not present

## 2016-12-14 DIAGNOSIS — Z794 Long term (current) use of insulin: Secondary | ICD-10-CM | POA: Diagnosis not present

## 2016-12-14 DIAGNOSIS — Z992 Dependence on renal dialysis: Secondary | ICD-10-CM | POA: Diagnosis not present

## 2016-12-14 DIAGNOSIS — N186 End stage renal disease: Secondary | ICD-10-CM | POA: Diagnosis not present

## 2016-12-14 DIAGNOSIS — E1122 Type 2 diabetes mellitus with diabetic chronic kidney disease: Secondary | ICD-10-CM | POA: Diagnosis not present

## 2016-12-14 DIAGNOSIS — I120 Hypertensive chronic kidney disease with stage 5 chronic kidney disease or end stage renal disease: Secondary | ICD-10-CM | POA: Diagnosis not present

## 2016-12-14 DIAGNOSIS — Z6841 Body Mass Index (BMI) 40.0 and over, adult: Secondary | ICD-10-CM | POA: Diagnosis not present

## 2016-12-14 DIAGNOSIS — L89313 Pressure ulcer of right buttock, stage 3: Secondary | ICD-10-CM | POA: Diagnosis not present

## 2017-01-03 ENCOUNTER — Telehealth: Payer: Self-pay | Admitting: Internal Medicine

## 2017-01-03 MED ORDER — INSULIN GLARGINE 100 UNIT/ML ~~LOC~~ SOLN: 25.0000 [IU] | Freq: Two times a day (BID) | SUBCUTANEOUS | 11 refills | Status: DC

## 2017-01-03 MED FILL — LOSARTAN POTASSIUM 50 MG TA: 50 | 30 days supply | Qty: 30 | Fill #0

## 2017-01-03 NOTE — Telephone Encounter (Signed)
Mother is calling to get a refill on her son's insulin to be called in. Blima Rich

## 2017-01-03 NOTE — Telephone Encounter (Signed)
Have refilled insulin.   Phill Myron, D.O. 01/03/2017, 11:53 AM PGY-3, [place]

## 2017-01-11 ENCOUNTER — Ambulatory Visit (INDEPENDENT_AMBULATORY_CARE_PROVIDER_SITE_OTHER): Payer: Medicaid Other | Admitting: Internal Medicine

## 2017-01-11 ENCOUNTER — Encounter: Payer: Self-pay | Admitting: Internal Medicine

## 2017-01-11 DIAGNOSIS — Z794 Long term (current) use of insulin: Secondary | ICD-10-CM

## 2017-01-11 DIAGNOSIS — E1122 Type 2 diabetes mellitus with diabetic chronic kidney disease: Secondary | ICD-10-CM | POA: Diagnosis not present

## 2017-01-11 DIAGNOSIS — E1165 Type 2 diabetes mellitus with hyperglycemia: Secondary | ICD-10-CM | POA: Diagnosis not present

## 2017-01-11 DIAGNOSIS — E039 Hypothyroidism, unspecified: Secondary | ICD-10-CM | POA: Diagnosis present

## 2017-01-11 DIAGNOSIS — Z992 Dependence on renal dialysis: Secondary | ICD-10-CM

## 2017-01-11 DIAGNOSIS — E1129 Type 2 diabetes mellitus with other diabetic kidney complication: Secondary | ICD-10-CM

## 2017-01-11 DIAGNOSIS — I10 Essential (primary) hypertension: Secondary | ICD-10-CM | POA: Diagnosis not present

## 2017-01-11 DIAGNOSIS — N186 End stage renal disease: Secondary | ICD-10-CM | POA: Diagnosis not present

## 2017-01-11 LAB — POCT GLYCOSYLATED HEMOGLOBIN (HGB A1C): HEMOGLOBIN A1C: 5.2

## 2017-01-11 MED ORDER — METOPROLOL TARTRATE 25 MG PO TABS: 12.5000 mg | ORAL_TABLET | Freq: Two times a day (BID) | ORAL | 3 refills | Status: DC

## 2017-01-11 MED ORDER — LEVOTHYROXINE SODIUM 150 MCG PO TABS: 150.0000 ug | ORAL_TABLET | Freq: Every day | ORAL | 0 refills | Status: DC

## 2017-01-11 MED ORDER — INSULIN GLARGINE 100 UNIT/ML ~~LOC~~ SOLN: 20.0000 [IU] | Freq: Two times a day (BID) | SUBCUTANEOUS | 11 refills | Status: DC

## 2017-01-11 MED ORDER — AMLODIPINE BESYLATE 10 MG PO TABS: 10.0000 mg | ORAL_TABLET | Freq: Every day | ORAL | 3 refills | Status: DC

## 2017-01-11 NOTE — Patient Instructions (Addendum)
Please decrease your insulin to 20 units twice per day. Get your diabetes supplies from [facility] and check your fasting blood sugar each morning for a few days. Call me with these numbers. I have refilled your Amlodipine 10 mg, Synthroid, and Metoprolol.   We will check your thyroid lab today and call you with the results.

## 2017-01-11 NOTE — Progress Notes (Signed)
   Subjective:    Paul [name] - 26 y.o. male MRN [id number]  Date of birth: Jun 02, 1990  HPI  Paul [name] is here for follow up of chronic medical conditions.  T2DM: Currently taking Lantus 25u BID. Has not been checking his CBGs for >1 month due to not picking up his diabetic supplies from the pharmacy. He denies shakiness, sweatiness, passing out. Denies polyuria. Denies vision changes.   Hypothyroidism: Reports compliance with Synthroid. Denies hot/cold intolerance, unintended weight changes, skin/nail/hair changes.   Chronic HTN Disease Monitoring:  Home BP Monitoring - no Chest pain- no  Dyspnea- no Headache - no  Medications: Metoprolol 12.5 mg BID, Amlodipine 10 mg (recently increased from 5 mg by nephrology doctor)  Compliance- yes Lightheadedness- no  Edema- no    -  reports that he has never smoked. He has never used smokeless tobacco. - Review of Systems: Per HPI. - Past Medical History: Patient Active Problem List   Diagnosis Date Noted  . VAP (ventilator-associated pneumonia) ([place])   . AKI (acute kidney injury) ([place])   . HAP (hospital-acquired pneumonia)   . Respiratory distress   . Acute hypoxemic respiratory failure ([place]) 09/11/2016  . Pressure injury of skin 09/11/2016  . Acute renal failure (ARF) ([place])   . Metabolic acidosis   . Acute pulmonary edema (HCC)   . Hypothyroidism 10/29/2009  . Diabetes mellitus type 2, uncontrolled ([place]) 04/14/2009  . Hyperlipidemia 04/14/2009  . OBESITY, MORBID 04/14/2009  . Essential hypertension, benign 04/14/2009   - Medications: reviewed and updated   Objective:   Physical Exam BP 138/78   Pulse 73   Temp 98.1 F (36.7 C) (Oral)   Wt (!) 357 lb (161.9 kg)   SpO2 99%   BMI 45.84 kg/m  Gen: NAD, alert, cooperative with exam, well-appearing CV: RRR, good S1/S2, no murmur, no edema, left AV fistula  Resp: CTABL, no wheezes, non-labored    Assessment & Plan:   1. Type 2 diabetes mellitus with  other diabetic kidney complication, with long-term current use of insulin (HCC) A1c 5.2 today decreased from 5.5 in May 2018. Will decrease Insulin to 20u BID. Suspect could decrease even further but patient has not been checking CBGs recently.  - HgB A1c - insulin glargine (LANTUS) 100 UNIT/ML injection; Inject 0.2 mLs (20 Units total) into the skin 2 (two) times daily.  Dispense: 10 mL; Refill: 11 -obtain diabetic supplies and start daily fasting glucose log  -return in one month for further adjustment of insulin   2. Hypothyroidism, unspecified type Last TSH elevated at 11.5 in April 2018.  - levothyroxine (SYNTHROID, LEVOTHROID) 150 MCG tablet; Take 1 tablet (150 mcg total) by mouth daily. Needs office visit  Dispense: 90 tablet; Refill: 0 - TSH  3. Essential hypertension Controlled today.  - amLODipine (NORVASC) 10 MG tablet; Take 1 tablet (10 mg total) by mouth daily.  Dispense: 90 tablet; Refill: 3 - metoprolol tartrate (LOPRESSOR) 25 MG tablet; Take 0.5 tablets (12.5 mg total) by mouth 2 (two) times daily.  Dispense: 180 tablet; Refill: [place], D.O. 01/11/2017, 10:04 AM PGY-3, [place]

## 2017-04-04 DIAGNOSIS — L299 Pruritus, unspecified: Secondary | ICD-10-CM | POA: Insufficient documentation

## 2017-06-24 ENCOUNTER — Other Ambulatory Visit: Payer: Self-pay | Admitting: Internal Medicine

## 2017-06-24 DIAGNOSIS — E039 Hypothyroidism, unspecified: Secondary | ICD-10-CM

## 2017-06-26 NOTE — Telephone Encounter (Signed)
Have sent in 30 day supply. Patient needs office visit for hypothyroidism for further refills.   Phill Myron, D.O. 06/26/2017, 9:46 AM PGY-3, [place]

## 2017-07-04 NOTE — Telephone Encounter (Signed)
Advised patient to call back for appointment.

## 2017-10-11 ENCOUNTER — Other Ambulatory Visit: Payer: Self-pay | Admitting: *Deleted

## 2017-10-11 DIAGNOSIS — E039 Hypothyroidism, unspecified: Secondary | ICD-10-CM

## 2017-10-17 MED ORDER — LEVOTHYROXINE SODIUM 150 MCG PO TABS: ORAL_TABLET | ORAL | 0 refills | Status: DC

## 2017-10-30 ENCOUNTER — Ambulatory Visit: Payer: Self-pay | Admitting: Internal Medicine

## 2017-11-16 ENCOUNTER — Ambulatory Visit (INDEPENDENT_AMBULATORY_CARE_PROVIDER_SITE_OTHER): Payer: Medicaid Other | Admitting: Internal Medicine

## 2017-11-16 VITALS — BP 125/80 | HR 111 | Temp 98.5°F | Wt >= 6400 oz

## 2017-11-16 DIAGNOSIS — Z794 Long term (current) use of insulin: Secondary | ICD-10-CM | POA: Diagnosis not present

## 2017-11-16 DIAGNOSIS — I10 Essential (primary) hypertension: Secondary | ICD-10-CM | POA: Diagnosis not present

## 2017-11-16 DIAGNOSIS — Z23 Encounter for immunization: Secondary | ICD-10-CM

## 2017-11-16 DIAGNOSIS — N186 End stage renal disease: Secondary | ICD-10-CM

## 2017-11-16 DIAGNOSIS — Z992 Dependence on renal dialysis: Secondary | ICD-10-CM

## 2017-11-16 DIAGNOSIS — E039 Hypothyroidism, unspecified: Secondary | ICD-10-CM

## 2017-11-16 DIAGNOSIS — E1129 Type 2 diabetes mellitus with other diabetic kidney complication: Secondary | ICD-10-CM | POA: Diagnosis not present

## 2017-11-16 DIAGNOSIS — E1122 Type 2 diabetes mellitus with diabetic chronic kidney disease: Secondary | ICD-10-CM

## 2017-11-16 LAB — POCT GLYCOSYLATED HEMOGLOBIN (HGB A1C): HBA1C, POC (CONTROLLED DIABETIC RANGE): 6.8 % (ref 0.0–7.0)

## 2017-11-16 NOTE — Assessment & Plan Note (Signed)
Followed by [facility]. Reports no change in his kidney function, anticipate need for transplant in the future.

## 2017-11-16 NOTE — Assessment & Plan Note (Signed)
Last A1c at goal. Will repeat today as has been >6 months. Foot exam performed today. Referral to ophthalmology placed for diabetic eye exam.

## 2017-11-16 NOTE — Assessment & Plan Note (Signed)
BP well controlled today, at goal. Continue current regimen. Will check BMET today.

## 2017-11-16 NOTE — Progress Notes (Signed)
Subjective:    Paul [name] - 27 y.o. male MRN [id number]  Date of birth: 11/22/1990  HPI  Paul [name] is here for follow up of chronic medical conditions.  Diabetes mellitus, Type 2 Disease Monitoring Blood Sugar Ranges: Patient has not been checking CBGs for the past several weeks due to changes in work schedule.  Polyuria: no Visual problems: no   Urine Microalbumin Not candidate d/t on Ace/Arb therapy.   Last A1C: 5.2  Medication Compliance: yes, takes Lantus 15-25u BID (patient decides how much insulin to take based on what he is eating and his blood sugars)   Medication Side Effects Hypoglycemia: no   Preventitive Health Care Eye Exam: Recommended. Referral placed. Patient reports last exam was >2 years ago and he does not have eye doctor in [place].  Foot Exam: Completed today.   Hypothyroidism: Reports compliance with Synthroid 150 mcg. No fatigue, changes in hair/nails, hot/cold intolerance, or unintended weight changes.   Chronic HTN Disease Monitoring:  Home BP Monitoring - Does not monitor.  Chest pain- no  Dyspnea- no Headache - no  Compliance- yes Lightheadedness- no  Edema- no    ESRD: Patient [facility]. Receives HD 3x/week. Missed HD today but is asymptomatic. Reports he will likely need a kidney transplant.    Health Maintenance Due  Topic Date Due  . PNEUMOCOCCAL POLYSACCHARIDE VACCINE (1) 05/28/1994  . OPHTHALMOLOGY EXAM  05/28/2002  . TETANUS/TDAP  05/29/2011  . FOOT EXAM  10/05/2014  . HEMOGLOBIN A1C  04/13/2017    -  reports that he has never smoked. He has never used smokeless tobacco. - Review of Systems: Per HPI. - Past Medical History: Patient Active Problem List   Diagnosis Date Noted  . ESRD on dialysis ([place]) 11/16/2017  . VAP (ventilator-associated pneumonia) ([place])   . HAP (hospital-acquired pneumonia)   . Respiratory  distress   . Acute hypoxemic respiratory failure ([place]) 09/11/2016  . Pressure injury of skin 09/11/2016  . Metabolic acidosis   . Acute pulmonary edema (HCC)   . Hypothyroidism 10/29/2009  . Type 2 diabetes mellitus ([place]) 04/14/2009  . Hyperlipidemia 04/14/2009  . OBESITY, MORBID 04/14/2009  . Essential hypertension, benign 04/14/2009   - Medications: reviewed and updated   Objective:   Physical Exam BP 125/80 (BP Location: Left Wrist, Patient Position: Sitting, Cuff Size: Normal)   Pulse (!) 111   Temp 98.5 F (36.9 C) (Oral)   Wt (!) 406 lb 12.8 oz (184.5 kg)   SpO2 93%   BMI 52.23 kg/m  Gen: NAD, alert, cooperative with exam, well-appearing, obese CV: RRR, good S1/S2, no murmur, no edema, capillary refill brisk  Resp: CTABL, no wheezes, non-labored    Diabetic Foot Check -  Appearance - no lesions, ulcers or calluses Skin - no unusual pallor or redness Monofilament testing - normal bilaterally  Right - Great toe, medial, central, lateral ball and posterior foot intact Left - Great toe, medial, central, lateral ball and posterior foot intact     Assessment & Plan:   Essential hypertension, benign BP well controlled today, at goal. Continue current regimen. Will check BMET today.   Hypothyroidism Last TSH elevated to 11 in April 2018. Reports compliance with Synthroid and no symptoms related to thyroid. Will check TSH today and adjust medication as needed.   ESRD on dialysis ([place]) Followed by [facility]. Reports no change in his kidney function, anticipate need for transplant in the future.   Type 2 diabetes mellitus (  [place]) Last A1c at goal. Will repeat today as has been >6 months. Foot exam performed today. Referral to ophthalmology placed for diabetic eye exam.   Need for Tdap vaccination - Tdap vaccine greater than or equal to 7yo IM  Need for prophylactic vaccination against Streptococcus pneumoniae (pneumococcus) - Pneumococcal polysaccharide vaccine 23-valent  greater than or equal to 2yo subcutaneous/IM   Phill Myron, D.O. 11/16/2017, 10:18 AM PGY-3, [place]

## 2017-11-16 NOTE — Patient Instructions (Signed)
We will check your thyroid, electrolytes, kidney function, and A1c. We will call you with the results.   I have placed a referral for a diabetic eye exam. It is important that you have eye exams once per year with your diabetes.

## 2017-11-16 NOTE — Assessment & Plan Note (Signed)
Last TSH elevated to 11 in April 2018. Reports compliance with Synthroid and no symptoms related to thyroid. Will check TSH today and adjust medication as needed.

## 2017-11-17 ENCOUNTER — Other Ambulatory Visit: Payer: Self-pay | Admitting: Internal Medicine

## 2017-11-17 DIAGNOSIS — E039 Hypothyroidism, unspecified: Secondary | ICD-10-CM

## 2017-11-17 LAB — TSH: TSH: 5.1 u[IU]/mL — AB (ref 0.450–4.500)

## 2017-11-17 LAB — BASIC METABOLIC PANEL
BUN/Creatinine Ratio: 3 — ABNORMAL LOW (ref 9–20)
BUN: 44 mg/dL — ABNORMAL HIGH (ref 6–20)
CALCIUM: 7.8 mg/dL — AB (ref 8.7–10.2)
CHLORIDE: 96 mmol/L (ref 96–106)
CO2: 24 mmol/L (ref 20–29)
Creatinine, Ser: 13.63 mg/dL — ABNORMAL HIGH (ref 0.76–1.27)
GFR calc Af Amer: 5 mL/min/{1.73_m2} — ABNORMAL LOW (ref >59)
GFR calc non Af Amer: 4 mL/min/{1.73_m2} — ABNORMAL LOW (ref >59)
GLUCOSE: 122 mg/dL — AB (ref 65–99)
POTASSIUM: 4.8 mmol/L (ref 3.5–5.2)
SODIUM: 143 mmol/L (ref 134–144)

## 2017-11-17 MED ORDER — LEVOTHYROXINE SODIUM 175 MCG PO TABS: ORAL_TABLET | ORAL | 3 refills | Status: DC

## 2017-11-17 NOTE — Progress Notes (Signed)
Pt informed of below. Zimmerman Rumple, Thaddeaus Monica D, CMA  

## 2017-11-17 NOTE — Progress Notes (Signed)
Given TSH slightly elevated, I have increased Synthroid from 150 mcg to 175 mcg. I would recommend that he follows up with new PCP in the next couple of months to discuss management of his thyroid and discuss need for repeat lab work.   Phill Myron, D.O. 11/17/2017, 11:03 AM PGY-3, [place]

## 2017-11-22 ENCOUNTER — Other Ambulatory Visit: Payer: Self-pay

## 2017-11-22 ENCOUNTER — Telehealth: Payer: Self-pay

## 2017-11-22 DIAGNOSIS — Z794 Long term (current) use of insulin: Principal | ICD-10-CM

## 2017-11-22 DIAGNOSIS — E1129 Type 2 diabetes mellitus with other diabetic kidney complication: Secondary | ICD-10-CM

## 2017-11-27 ENCOUNTER — Telehealth: Payer: Self-pay

## 2017-11-27 MED ORDER — INSULIN GLARGINE 100 UNIT/ML ~~LOC~~ SOLN
20.0000 [IU] | Freq: Two times a day (BID) | SUBCUTANEOUS | 11 refills | Status: DC
Start: 2017-11-27 — End: 2019-01-10

## 2017-11-27 NOTE — Telephone Encounter (Signed)
2nd request. I did inform mother PCPs have changed and new is one has been on ML and will be back this week.

## 2017-11-27 NOTE — Telephone Encounter (Signed)
Mother called nurse line stating she went to [facility] to pick up insulin and they never received the prescription. Per chart review, [facility] electronically sent in rx. I called the pharmacy and phoned in prescription, as they said they never received. Per pharmacist, rx should be ready for pick up after 2pm. I called mom to inform, she did not answer and no option for VM.

## 2017-12-01 ENCOUNTER — Other Ambulatory Visit: Payer: Self-pay | Admitting: *Deleted

## 2017-12-03 MED ORDER — ROSUVASTATIN CALCIUM 20 MG PO TABS
20.0000 mg | ORAL_TABLET | Freq: Every day | ORAL | 3 refills | Status: DC
Start: 2017-12-03 — End: 2019-01-15

## 2017-12-16 DIAGNOSIS — E1129 Type 2 diabetes mellitus with other diabetic kidney complication: Secondary | ICD-10-CM | POA: Diagnosis not present

## 2017-12-16 DIAGNOSIS — N186 End stage renal disease: Secondary | ICD-10-CM | POA: Diagnosis not present

## 2017-12-20 DIAGNOSIS — Z992 Dependence on renal dialysis: Secondary | ICD-10-CM | POA: Diagnosis not present

## 2017-12-20 DIAGNOSIS — E1129 Type 2 diabetes mellitus with other diabetic kidney complication: Secondary | ICD-10-CM | POA: Diagnosis not present

## 2017-12-20 DIAGNOSIS — N186 End stage renal disease: Secondary | ICD-10-CM | POA: Diagnosis not present

## 2018-01-18 DIAGNOSIS — N186 End stage renal disease: Secondary | ICD-10-CM | POA: Diagnosis not present

## 2018-01-18 DIAGNOSIS — D509 Iron deficiency anemia, unspecified: Secondary | ICD-10-CM | POA: Diagnosis not present

## 2018-01-20 DIAGNOSIS — E1129 Type 2 diabetes mellitus with other diabetic kidney complication: Secondary | ICD-10-CM | POA: Diagnosis not present

## 2018-01-20 DIAGNOSIS — N186 End stage renal disease: Secondary | ICD-10-CM | POA: Diagnosis not present

## 2018-01-20 DIAGNOSIS — Z992 Dependence on renal dialysis: Secondary | ICD-10-CM | POA: Diagnosis not present

## 2018-02-01 ENCOUNTER — Telehealth: Payer: Self-pay | Admitting: Student in an Organized Health Care Education/Training Program

## 2018-02-01 NOTE — Telephone Encounter (Signed)
Called patient to schedule appointment for health maintenance (A1C due 02/16/18). There was no answer. KG.

## 2018-02-15 DIAGNOSIS — N186 End stage renal disease: Secondary | ICD-10-CM | POA: Diagnosis not present

## 2018-02-15 DIAGNOSIS — D509 Iron deficiency anemia, unspecified: Secondary | ICD-10-CM | POA: Diagnosis not present

## 2018-02-19 DIAGNOSIS — E1129 Type 2 diabetes mellitus with other diabetic kidney complication: Secondary | ICD-10-CM | POA: Diagnosis not present

## 2018-02-19 DIAGNOSIS — Z992 Dependence on renal dialysis: Secondary | ICD-10-CM | POA: Diagnosis not present

## 2018-02-19 DIAGNOSIS — N186 End stage renal disease: Secondary | ICD-10-CM | POA: Diagnosis not present

## 2018-03-11 ENCOUNTER — Other Ambulatory Visit: Payer: Self-pay

## 2018-03-11 ENCOUNTER — Emergency Department (HOSPITAL_COMMUNITY)
Admission: EM | Admit: 2018-03-11 | Discharge: 2018-03-11 | Disposition: A | Discharge status: Home / Self Care | Payer: Medicaid Other | Attending: Emergency Medicine | Admitting: Emergency Medicine

## 2018-03-11 DIAGNOSIS — Z794 Long term (current) use of insulin: Secondary | ICD-10-CM | POA: Diagnosis not present

## 2018-03-11 DIAGNOSIS — Z79899 Other long term (current) drug therapy: Secondary | ICD-10-CM | POA: Insufficient documentation

## 2018-03-11 DIAGNOSIS — R58 Hemorrhage, not elsewhere classified: Secondary | ICD-10-CM

## 2018-03-11 DIAGNOSIS — I120 Hypertensive chronic kidney disease with stage 5 chronic kidney disease or end stage renal disease: Secondary | ICD-10-CM | POA: Insufficient documentation

## 2018-03-11 DIAGNOSIS — E1122 Type 2 diabetes mellitus with diabetic chronic kidney disease: Secondary | ICD-10-CM | POA: Diagnosis not present

## 2018-03-11 DIAGNOSIS — T8249XA Other complication of vascular dialysis catheter, initial encounter: Secondary | ICD-10-CM | POA: Diagnosis present

## 2018-03-11 DIAGNOSIS — I10 Essential (primary) hypertension: Secondary | ICD-10-CM | POA: Diagnosis not present

## 2018-03-11 DIAGNOSIS — N186 End stage renal disease: Secondary | ICD-10-CM | POA: Insufficient documentation

## 2018-03-11 DIAGNOSIS — Z992 Dependence on renal dialysis: Secondary | ICD-10-CM | POA: Insufficient documentation

## 2018-03-11 DIAGNOSIS — T82838A Hemorrhage of vascular prosthetic devices, implants and grafts, initial encounter: Secondary | ICD-10-CM | POA: Diagnosis not present

## 2018-03-11 DIAGNOSIS — Y829 Unspecified medical devices associated with adverse incidents: Secondary | ICD-10-CM | POA: Diagnosis not present

## 2018-03-11 DIAGNOSIS — R000 Tachycardia, unspecified: Secondary | ICD-10-CM | POA: Diagnosis not present

## 2018-03-11 LAB — I-STAT CHEM 8, ED
BUN: 35 mg/dL — ABNORMAL HIGH (ref 6–20)
Calcium, Ion: 0.8 mmol/L — CL (ref 1.15–1.40)
Chloride: 98 mmol/L (ref 98–111)
Creatinine, Ser: 15.3 mg/dL — ABNORMAL HIGH (ref 0.61–1.24)
Glucose, Bld: 196 mg/dL — ABNORMAL HIGH (ref 70–99)
HEMATOCRIT: 36 % — AB (ref 39.0–52.0)
HEMOGLOBIN: 12.2 g/dL — AB (ref 13.0–17.0)
Potassium: 4.6 mmol/L (ref 3.5–5.1)
SODIUM: 138 mmol/L (ref 135–145)
TCO2: 29 mmol/L (ref 22–32)

## 2018-03-11 NOTE — ED Triage Notes (Signed)
Pt to ED from home, T, TH, S dialysis pt. Had dialysis Saturday without issue. This am was laying in bed, put ointment on fistua, coughed and blood started spurting from it "across the room" per EMS. Direct pressure held x20 mins, bleeding controlled on arrival to ED. Denies dizziness and pain.

## 2018-03-11 NOTE — ED Provider Notes (Signed)
[facility] EMERGENCY DEPARTMENT Provider Note  CSN: [id number] Arrival date & time: 03/11/18 3976  Chief Complaint(s) No chief complaint on file.  HPI Paul [name] is a 27 y.o. male with a history of ESRD on dialysis Tuesday, Thursday, Saturday through a left brachial AV fistula who presents to the emergency department for bleeding fistula.  Patient reported that he had a scab over the fistula has been placing some antibiotic ointment on it.  Today when he was applying ointment he began to cough and his fistula began bleeding profusely.  EMS was called.  Direct pressure was applied and bleeding was controlled.  Patient denies any injuries.  Currently denies any other physical complaints.  HPI  Past Medical History Past Medical History:  Diagnosis Date  . Anemia   . Chronic kidney disease    ARF on CRF Dialysis T/TH/Sa  . Diabetes mellitus    Type II  . GERD (gastroesophageal reflux disease)   . Hypertension   . Morbid obesity ([place])   . Sacral wound   . Thyroid disease    Patient Active Problem List   Diagnosis Date Noted  . ESRD on dialysis ([place]) 11/16/2017  . VAP (ventilator-associated pneumonia) ([place])   . HAP (hospital-acquired pneumonia)   . Respiratory distress   . Acute hypoxemic respiratory failure ([place]) 09/11/2016  . Pressure injury of skin 09/11/2016  . Metabolic acidosis   . Acute pulmonary edema ([facility])   . Hypothyroidism 10/29/2009  . Type 2 diabetes mellitus ([place]) 04/14/2009  . Hyperlipidemia 04/14/2009  . OBESITY, MORBID 04/14/2009  . Essential hypertension, benign 04/14/2009   Home Medication(s) Prior to Admission medications   Medication Sig Start Date End Date Taking? Authorizing Provider  amLODipine (NORVASC) 10 MG tablet Take 1 tablet (10 mg total) by mouth daily. 01/11/17  Yes Nicolette Bang, DO  insulin glargine (LANTUS) 100 UNIT/ML injection Inject 0.2 mLs (20 Units total) into the skin 2 (two) times daily. 11/27/17  Yes  Everrett Coombe, MD  levothyroxine (SYNTHROID, LEVOTHROID) 175 MCG tablet TAKE 1 TABLET BY MOUTH ONCE DAILY **NEEDS  OFFICE  VISIT** Patient taking differently: Take 175 mcg by mouth daily before breakfast.  11/17/17  Yes Nicolette Bang, DO  multivitamin (RENA-VIT) TABS tablet Take 1 tablet by mouth at bedtime. 10/25/16  Yes Nicolette Bang, DO  rosuvastatin (CRESTOR) 20 MG tablet Take 1 tablet (20 mg total) by mouth daily. 12/03/17  Yes Everrett Coombe, MD  sevelamer carbonate (RENVELA) 800 MG tablet Take 2,400 mg by mouth 3 (three) times daily with meals.   Yes [provider]  ACCU-CHEK SOFTCLIX LANCETS lancets Use as instructed to test blood glucose four times daily before meals and at bedtime. ICD-10 code: E11.65. 12/02/16   Nicolette Bang, DO  acetaminophen (TYLENOL) 325 MG tablet Take 2 tablets (650 mg total) by mouth every 6 (six) hours as needed for mild pain or headache. Patient not taking: Reported on 03/11/2018 09/28/16   Robbie Lis, MD  Blood Glucose Monitoring Suppl (ACCU-CHEK AVIVA PLUS) w/Device KIT 1 kit by Does not apply route daily. 12/01/16   Carlyle Dolly, MD  Blood Glucose Monitoring Suppl (ACCU-CHEK NANO SMARTVIEW) W/DEVICE KIT 1 Device by Does not apply route 4 (four) times daily -  before meals and at bedtime. 03/21/14   Mariel Aloe, MD  calcium acetate (PHOSLO) 667 MG capsule Take 2 capsules (1,334 mg total) by mouth 3 (three) times daily with meals. Patient not taking: Reported on 03/11/2018  10/25/16   Nicolette Bang, DO  glucose blood (ACCU-CHEK AVIVA) test strip Check sugar 6 x daily 12/01/16   Carlyle Dolly, MD  oxyCODONE-acetaminophen (PERCOCET/ROXICET) 5-325 MG tablet Take 1 tablet by mouth every 6 (six) hours as needed. Patient not taking: Reported on 03/11/2018 12/02/16   [name], PA-C                                                                                                                                     Past Surgical History Past Surgical History:  Procedure Laterality Date  . AV FISTULA PLACEMENT Left 09/26/2016   Procedure: LEFT UPPER ARM ARTERIOVENOUS (AV) FISTULA CREATION;  Surgeon: Elam Dutch, MD;  Location: [facility] OR;  Service: Vascular;  Laterality: Left;  . AV FISTULA PLACEMENT Left 12/02/2016   Procedure: INSERTION OF ARTERIOVENOUS GORE-TEX GRAFT LEFT UPPER  ARM USING A 4-7MM BY 45CM GRAFT ;  Surgeon: Rosetta Posner, MD;  Location: [facility];  Service: Vascular;  Laterality: Left;  . EXCHANGE OF A DIALYSIS CATHETER Right 10/08/2016   Procedure: EXCHANGE OF A DIALYSIS CATHETER-RIGHT INTERNAL JUGULAR;  Surgeon: Conrad Mill Creek, MD;  Location: [place];  Service: Vascular;  Laterality: Right;  . INSERTION OF DIALYSIS CATHETER Right 09/26/2016   Procedure: INSERTION OF DIALYSIS CATHETER - Right Internal Jugular Placement;  Surgeon: Elam Dutch, MD;  Location: [facility] OR;  Service: Vascular;  Laterality: Right;   Family History Family History  Problem Relation Age of Onset  . Heart disease Mother   . Hypertension Mother     Social History Social History   Tobacco Use  . Smoking status: Never Smoker  . Smokeless tobacco: Never Used  Substance Use Topics  . Alcohol use: No  . Drug use: No   Allergies Hydralazine hcl  Review of Systems Review of Systems All other systems are reviewed and are negative for acute change except as noted in the HPI  Physical Exam Vital Signs  I have reviewed the triage vital signs BP (!) 147/76 (BP Location: Right Arm)   Pulse (!) 106   Temp 98.5 F (36.9 C) (Oral)   Resp 16   Ht _0  (1.88 m)   Wt (!) 179.4 kg   SpO2 99%   BMI 50.78 kg/m   Physical Exam  Constitutional: He is oriented to person, place, and time. He appears well-developed and well-nourished. No distress.  HENT:  Head: Normocephalic and atraumatic.  Right Ear: External ear normal.  Left Ear: External ear normal.  Nose: Nose normal.  Mouth/Throat: Mucous membranes  are normal. No trismus in the jaw.  Eyes: Conjunctivae and EOM are normal. No scleral icterus.  Neck: Normal range of motion and phonation normal.  Cardiovascular: Normal rate and regular rhythm.  Pulmonary/Chest: Effort normal. No stridor. No respiratory distress.  Abdominal: He exhibits no distension.  Musculoskeletal: Normal range of motion. He exhibits no edema.  Arms: Neurological: He is alert and oriented to person, place, and time.  Skin: He is not diaphoretic.  Psychiatric: He has a normal mood and affect. His behavior is normal.  Vitals reviewed.   ED Results and Treatments Labs (all labs ordered are listed, but only abnormal results are displayed) Labs Reviewed  I-STAT CHEM 8, ED - Abnormal; Notable for the following components:      Result Value   BUN 35 (*)    Creatinine, Ser 15.30 (*)    Glucose, Bld 196 (*)    Calcium, Ion 0.80 (*)    Hemoglobin 12.2 (*)    HCT 36.0 (*)    All other components within normal limits                                                                                                                         EKG  EKG Interpretation  Date/Time:    Ventricular Rate:    PR Interval:    QRS Duration:   QT Interval:    QTC Calculation:   R Axis:     Text Interpretation:        Radiology No results found. Pertinent labs & imaging results that were available during my care of the patient were reviewed by me and considered in my medical decision making (see chart for details).  Medications Ordered in ED Medications - No data to display                                                                                                                                  Procedures Procedures  (including critical care time)  Medical Decision Making / ED Course I have reviewed the nursing notes for this encounter and the patient's prior records (if available in EHR or on provided paperwork).    Bleeding AV fistula now hemostatic.   Nonstick dressing applied.  Hemoglobin stable.  The patient appears reasonably screened and/or stabilized for discharge and I doubt any other medical condition or other [facility] requiring further screening, evaluation, or treatment in the ED at this time prior to discharge.  The patient is safe for discharge with strict return precautions.   Final Clinical Impression(s) / ED Diagnoses Final diagnoses:  Bleeding  Hypocalcemia    Disposition: Discharge  Condition: Good  I have discussed the results, Dx and Tx plan with the patient who expressed understanding and agree(s) with the  plan. Discharge instructions discussed at great length. The patient was given strict return precautions who verbalized understanding of the instructions. No further questions at time of discharge.    ED Discharge Orders    None       Follow Up: Nephrologist  Call  regarding your calcium level  Vascular surgery  Schedule an appointment as soon as possible for a visit  if you continue to have problems with your fistula bleeding     This chart was dictated using voice recognition software.  Despite best efforts to proofread,  errors can occur which can change the documentation meaning.   Fatima Blank, MD 03/11/18 [phone]

## 2018-03-11 NOTE — Discharge Instructions (Signed)
Keep the bandaged until your next dialysis appointment. Then rebandage it for 1-2 weeks. You may change the dressing daily as needed.

## 2018-03-15 DIAGNOSIS — E1129 Type 2 diabetes mellitus with other diabetic kidney complication: Secondary | ICD-10-CM | POA: Diagnosis not present

## 2018-03-15 DIAGNOSIS — N186 End stage renal disease: Secondary | ICD-10-CM | POA: Diagnosis not present

## 2018-03-22 ENCOUNTER — Ambulatory Visit (HOSPITAL_COMMUNITY)
Admission: EM | Admit: 2018-03-22 | Discharge: 2018-03-22 | Disposition: A | Discharge status: Home / Self Care | Payer: Medicaid Other | Attending: Emergency Medicine | Admitting: Emergency Medicine

## 2018-03-22 ENCOUNTER — Encounter (HOSPITAL_COMMUNITY): Payer: Self-pay | Admitting: Internal Medicine

## 2018-03-22 ENCOUNTER — Encounter (HOSPITAL_COMMUNITY)
Admission: EM | Disposition: A | Discharge status: Home / Self Care | Payer: Self-pay | Source: Home / Self Care | Attending: Emergency Medicine

## 2018-03-22 ENCOUNTER — Emergency Department (HOSPITAL_COMMUNITY): Payer: Medicaid Other

## 2018-03-22 ENCOUNTER — Emergency Department (HOSPITAL_COMMUNITY): Payer: Medicaid Other | Admitting: Certified Registered Nurse Anesthetist

## 2018-03-22 DIAGNOSIS — E079 Disorder of thyroid, unspecified: Secondary | ICD-10-CM | POA: Diagnosis not present

## 2018-03-22 DIAGNOSIS — N186 End stage renal disease: Secondary | ICD-10-CM | POA: Insufficient documentation

## 2018-03-22 DIAGNOSIS — E1122 Type 2 diabetes mellitus with diabetic chronic kidney disease: Secondary | ICD-10-CM | POA: Diagnosis not present

## 2018-03-22 DIAGNOSIS — I10 Essential (primary) hypertension: Secondary | ICD-10-CM | POA: Diagnosis not present

## 2018-03-22 DIAGNOSIS — Y832 Surgical operation with anastomosis, bypass or graft as the cause of abnormal reaction of the patient, or of later complication, without mention of misadventure at the time of the procedure: Secondary | ICD-10-CM | POA: Insufficient documentation

## 2018-03-22 DIAGNOSIS — Z419 Encounter for procedure for purposes other than remedying health state, unspecified: Secondary | ICD-10-CM

## 2018-03-22 DIAGNOSIS — R58 Hemorrhage, not elsewhere classified: Secondary | ICD-10-CM

## 2018-03-22 DIAGNOSIS — K219 Gastro-esophageal reflux disease without esophagitis: Secondary | ICD-10-CM | POA: Insufficient documentation

## 2018-03-22 DIAGNOSIS — T82838A Hemorrhage of vascular prosthetic devices, implants and grafts, initial encounter: Secondary | ICD-10-CM | POA: Diagnosis not present

## 2018-03-22 DIAGNOSIS — Z794 Long term (current) use of insulin: Secondary | ICD-10-CM | POA: Diagnosis not present

## 2018-03-22 DIAGNOSIS — T82848A Pain from vascular prosthetic devices, implants and grafts, initial encounter: Secondary | ICD-10-CM | POA: Diagnosis not present

## 2018-03-22 DIAGNOSIS — T82898A Other specified complication of vascular prosthetic devices, implants and grafts, initial encounter: Secondary | ICD-10-CM | POA: Insufficient documentation

## 2018-03-22 DIAGNOSIS — E1129 Type 2 diabetes mellitus with other diabetic kidney complication: Secondary | ICD-10-CM | POA: Diagnosis not present

## 2018-03-22 DIAGNOSIS — I120 Hypertensive chronic kidney disease with stage 5 chronic kidney disease or end stage renal disease: Secondary | ICD-10-CM | POA: Diagnosis not present

## 2018-03-22 DIAGNOSIS — Z6841 Body Mass Index (BMI) 40.0 and over, adult: Secondary | ICD-10-CM | POA: Diagnosis not present

## 2018-03-22 DIAGNOSIS — T82868A Thrombosis of vascular prosthetic devices, implants and grafts, initial encounter: Secondary | ICD-10-CM | POA: Diagnosis not present

## 2018-03-22 DIAGNOSIS — T82590A Other mechanical complication of surgically created arteriovenous fistula, initial encounter: Secondary | ICD-10-CM | POA: Diagnosis present

## 2018-03-22 DIAGNOSIS — Z992 Dependence on renal dialysis: Secondary | ICD-10-CM | POA: Diagnosis not present

## 2018-03-22 DIAGNOSIS — J811 Chronic pulmonary edema: Secondary | ICD-10-CM | POA: Diagnosis not present

## 2018-03-22 HISTORY — PX: REVISON OF ARTERIOVENOUS FISTULA: SHX6074

## 2018-03-22 HISTORY — PX: INSERTION OF DIALYSIS CATHETER: SHX1324

## 2018-03-22 LAB — CBC
HEMATOCRIT: 32 % — AB (ref 39.0–52.0)
HEMOGLOBIN: 10.4 g/dL — AB (ref 13.0–17.0)
MCH: 28 pg (ref 26.0–34.0)
MCHC: 32.5 g/dL (ref 30.0–36.0)
MCV: 86 fL (ref 80.0–100.0)
NRBC: 0 % (ref 0.0–0.2)
Platelets: 240 10*3/uL (ref 150–400)
RBC: 3.72 MIL/uL — AB (ref 4.22–5.81)
RDW: 15.9 % — ABNORMAL HIGH (ref 11.5–15.5)
WBC: 11 10*3/uL — ABNORMAL HIGH (ref 4.0–10.5)

## 2018-03-22 LAB — BASIC METABOLIC PANEL
ANION GAP: 14 (ref 5–15)
BUN: 39 mg/dL — ABNORMAL HIGH (ref 6–20)
CHLORIDE: 96 mmol/L — AB (ref 98–111)
CO2: 28 mmol/L (ref 22–32)
Calcium: 8.5 mg/dL — ABNORMAL LOW (ref 8.9–10.3)
Creatinine, Ser: 15.14 mg/dL — ABNORMAL HIGH (ref 0.61–1.24)
GFR calc non Af Amer: 4 mL/min — ABNORMAL LOW (ref >60)
GFR, EST AFRICAN AMERICAN: 4 mL/min — AB (ref >60)
Glucose, Bld: 120 mg/dL — ABNORMAL HIGH (ref 70–99)
POTASSIUM: 4.1 mmol/L (ref 3.5–5.1)
Sodium: 138 mmol/L (ref 135–145)

## 2018-03-22 LAB — GLUCOSE, CAPILLARY
Glucose-Capillary: 108 mg/dL — ABNORMAL HIGH (ref 70–99)
Glucose-Capillary: 182 mg/dL — ABNORMAL HIGH (ref 70–99)

## 2018-03-22 SURGERY — REVISON OF ARTERIOVENOUS FISTULA
Anesthesia: General | Site: Chest | Laterality: Left

## 2018-03-22 MED ORDER — FENTANYL CITRATE (PF) 100 MCG/2ML IJ SOLN
INTRAMUSCULAR | Status: AC
  Filled 2018-03-22: qty 2

## 2018-03-22 MED ORDER — PHENYLEPHRINE 40 MCG/ML (10ML) SYRINGE FOR IV PUSH (FOR BLOOD PRESSURE SUPPORT)
PREFILLED_SYRINGE | INTRAVENOUS | Status: AC
  Filled 2018-03-22: qty 20

## 2018-03-22 MED ORDER — SODIUM CHLORIDE 0.9 % IV SOLN
INTRAVENOUS | Status: AC
  Filled 2018-03-22: qty 1.2

## 2018-03-22 MED ORDER — GLYCOPYRROLATE PF 0.2 MG/ML IJ SOSY
PREFILLED_SYRINGE | INTRAMUSCULAR | Status: AC
  Filled 2018-03-22: qty 5

## 2018-03-22 MED ORDER — LIDOCAINE 2% (20 MG/ML) 5 ML SYRINGE
INTRAMUSCULAR | Status: AC
  Filled 2018-03-22: qty 5

## 2018-03-22 MED ORDER — SODIUM CHLORIDE 0.9 % IV SOLN
INTRAVENOUS | Status: DC | PRN
  Administered 2018-03-22: 500 mL

## 2018-03-22 MED ORDER — OXYCODONE-ACETAMINOPHEN 5-325 MG PO TABS
1.0000 | ORAL_TABLET | Freq: Once | ORAL | Status: AC
  Administered 2018-03-22: 1 via ORAL

## 2018-03-22 MED ORDER — HEPARIN SODIUM (PORCINE) 1000 UNIT/ML IJ SOLN
INTRAMUSCULAR | Status: DC | PRN
  Administered 2018-03-22: 3800 [IU]

## 2018-03-22 MED ORDER — FENTANYL CITRATE (PF) 250 MCG/5ML IJ SOLN
INTRAMUSCULAR | Status: DC | PRN
  Administered 2018-03-22: 50 ug via INTRAVENOUS
  Administered 2018-03-22: 25 ug via INTRAVENOUS
  Administered 2018-03-22: 100 ug via INTRAVENOUS
  Administered 2018-03-22 (×2): 50 ug via INTRAVENOUS
  Administered 2018-03-22: 25 ug via INTRAVENOUS

## 2018-03-22 MED ORDER — MIDAZOLAM HCL 2 MG/2ML IJ SOLN
INTRAMUSCULAR | Status: DC | PRN
  Administered 2018-03-22: 2 mg via INTRAVENOUS

## 2018-03-22 MED ORDER — OXYCODONE HCL 5 MG PO TABS: 5.0000 mg | ORAL_TABLET | Freq: Once | ORAL | Status: DC | PRN

## 2018-03-22 MED ORDER — NEOSTIGMINE METHYLSULFATE 10 MG/10ML IV SOLN
INTRAVENOUS | Status: DC | PRN
  Administered 2018-03-22: 5 mg via INTRAVENOUS

## 2018-03-22 MED ORDER — ROCURONIUM BROMIDE 10 MG/ML (PF) SYRINGE
PREFILLED_SYRINGE | INTRAVENOUS | Status: DC | PRN
  Administered 2018-03-22: 50 mg via INTRAVENOUS

## 2018-03-22 MED ORDER — MIDAZOLAM HCL 2 MG/2ML IJ SOLN
INTRAMUSCULAR | Status: AC
  Filled 2018-03-22: qty 2

## 2018-03-22 MED ORDER — OXYCODONE HCL 5 MG/5ML PO SOLN: 5.0000 mg | Freq: Once | ORAL | Status: DC | PRN

## 2018-03-22 MED ORDER — PROPOFOL 10 MG/ML IV BOLUS
INTRAVENOUS | Status: DC | PRN
  Administered 2018-03-22: 300 mg via INTRAVENOUS
  Administered 2018-03-22: 30 mg via INTRAVENOUS

## 2018-03-22 MED ORDER — DEXTROSE 5 % IV SOLN
3.0000 g | INTRAVENOUS | Status: AC
  Administered 2018-03-22: 3 g via INTRAVENOUS
  Filled 2018-03-22: qty 3

## 2018-03-22 MED ORDER — HEPARIN SODIUM (PORCINE) 1000 UNIT/ML IJ SOLN
INTRAMUSCULAR | Status: AC
  Filled 2018-03-22: qty 1

## 2018-03-22 MED ORDER — OXYCODONE-ACETAMINOPHEN 5-325 MG PO TABS: 1.0000 | ORAL_TABLET | Freq: Four times a day (QID) | ORAL | 0 refills | Status: DC | PRN

## 2018-03-22 MED ORDER — 0.9 % SODIUM CHLORIDE (POUR BTL) OPTIME
TOPICAL | Status: DC | PRN
  Administered 2018-03-22: 1000 mL

## 2018-03-22 MED ORDER — CEFAZOLIN SODIUM-DEXTROSE 2-4 GM/100ML-% IV SOLN: 2.0000 g | INTRAVENOUS | Status: DC

## 2018-03-22 MED ORDER — PROPOFOL 10 MG/ML IV BOLUS
INTRAVENOUS | Status: AC
  Filled 2018-03-22: qty 20

## 2018-03-22 MED ORDER — ONDANSETRON HCL 4 MG/2ML IJ SOLN
INTRAMUSCULAR | Status: AC
  Filled 2018-03-22: qty 2

## 2018-03-22 MED ORDER — MEPERIDINE HCL 50 MG/ML IJ SOLN: 6.2500 mg | INTRAMUSCULAR | Status: DC | PRN

## 2018-03-22 MED ORDER — FENTANYL CITRATE (PF) 250 MCG/5ML IJ SOLN
INTRAMUSCULAR | Status: AC
  Filled 2018-03-22: qty 5

## 2018-03-22 MED ORDER — OXYCODONE-ACETAMINOPHEN 5-325 MG PO TABS
ORAL_TABLET | ORAL | Status: AC
  Filled 2018-03-22: qty 1

## 2018-03-22 MED ORDER — FENTANYL CITRATE (PF) 100 MCG/2ML IJ SOLN
25.0000 ug | INTRAMUSCULAR | Status: DC | PRN
  Administered 2018-03-22 (×2): 50 ug via INTRAVENOUS

## 2018-03-22 MED ORDER — PROMETHAZINE HCL 25 MG/ML IJ SOLN
INTRAMUSCULAR | Status: AC
  Filled 2018-03-22: qty 1

## 2018-03-22 MED ORDER — EPHEDRINE 5 MG/ML INJ
INTRAVENOUS | Status: AC
  Filled 2018-03-22: qty 10

## 2018-03-22 MED ORDER — PROMETHAZINE HCL 25 MG/ML IJ SOLN
6.2500 mg | INTRAMUSCULAR | Status: DC | PRN
  Administered 2018-03-22: 6.25 mg via INTRAVENOUS

## 2018-03-22 MED ORDER — LIDOCAINE 2% (20 MG/ML) 5 ML SYRINGE
INTRAMUSCULAR | Status: DC | PRN
  Administered 2018-03-22: 100 mg via INTRAVENOUS

## 2018-03-22 MED ORDER — PHENYLEPHRINE 40 MCG/ML (10ML) SYRINGE FOR IV PUSH (FOR BLOOD PRESSURE SUPPORT)
PREFILLED_SYRINGE | INTRAVENOUS | Status: DC | PRN
  Administered 2018-03-22 (×2): 80 ug via INTRAVENOUS

## 2018-03-22 MED ORDER — SODIUM CHLORIDE 0.9 % IV SOLN
INTRAVENOUS | Status: DC | PRN
  Administered 2018-03-22: 25 ug/min via INTRAVENOUS

## 2018-03-22 MED ORDER — PHENYLEPHRINE 40 MCG/ML (10ML) SYRINGE FOR IV PUSH (FOR BLOOD PRESSURE SUPPORT)
PREFILLED_SYRINGE | INTRAVENOUS | Status: AC
  Filled 2018-03-22: qty 10

## 2018-03-22 MED ORDER — NEOSTIGMINE METHYLSULFATE 3 MG/3ML IV SOSY
PREFILLED_SYRINGE | INTRAVENOUS | Status: AC
  Filled 2018-03-22: qty 6

## 2018-03-22 MED ORDER — SODIUM CHLORIDE 0.9 % IV SOLN
INTRAVENOUS | Status: DC
  Administered 2018-03-22: 12:00:00 via INTRAVENOUS

## 2018-03-22 MED ORDER — HEPARIN SODIUM (PORCINE) 1000 UNIT/ML IJ SOLN
INTRAMUSCULAR | Status: DC | PRN
  Administered 2018-03-22: 5000 [IU] via INTRAVENOUS

## 2018-03-22 MED ORDER — DEXAMETHASONE SODIUM PHOSPHATE 10 MG/ML IJ SOLN
INTRAMUSCULAR | Status: DC | PRN
  Administered 2018-03-22: 10 mg via INTRAVENOUS

## 2018-03-22 MED ORDER — GLYCOPYRROLATE 0.2 MG/ML IJ SOLN
INTRAMUSCULAR | Status: DC | PRN
  Administered 2018-03-22: .6 mg via INTRAVENOUS

## 2018-03-22 MED ORDER — DEXAMETHASONE SODIUM PHOSPHATE 10 MG/ML IJ SOLN
INTRAMUSCULAR | Status: AC
  Filled 2018-03-22: qty 1

## 2018-03-22 MED ORDER — ROCURONIUM BROMIDE 50 MG/5ML IV SOSY
PREFILLED_SYRINGE | INTRAVENOUS | Status: AC
  Filled 2018-03-22: qty 10

## 2018-03-22 MED ORDER — LIDOCAINE-EPINEPHRINE (PF) 1 %-1:200000 IJ SOLN
INTRAMUSCULAR | Status: AC
  Filled 2018-03-22: qty 30

## 2018-03-22 MED ORDER — ONDANSETRON HCL 4 MG/2ML IJ SOLN
INTRAMUSCULAR | Status: DC | PRN
  Administered 2018-03-22: 4 mg via INTRAVENOUS

## 2018-03-22 SURGICAL SUPPLY — 49 items
ARMBAND PINK RESTRICT EXTREMIT (MISCELLANEOUS) ×4 IMPLANT
BANDAGE ELASTIC 6 VELCRO ST LF (GAUZE/BANDAGES/DRESSINGS) ×4 IMPLANT
BIOPATCH RED 1 DISK 7.0 (GAUZE/BANDAGES/DRESSINGS) ×3 IMPLANT
BIOPATCH RED 1IN DISK 7.0MM (GAUZE/BANDAGES/DRESSINGS) ×1
BNDG GAUZE ELAST 4 BULKY (GAUZE/BANDAGES/DRESSINGS) ×4 IMPLANT
CANISTER SUCT 3000ML PPV (MISCELLANEOUS) ×4 IMPLANT
CATH EMB 3FR 40CM (CATHETERS) ×4 IMPLANT
CATH PALINDROME RT-P 15FX23CM (CATHETERS) IMPLANT
CATH PALINDROME RT-P 15FX28CM (CATHETERS) ×4 IMPLANT
CLIP VESOCCLUDE MED 6/CT (CLIP) ×4 IMPLANT
CLIP VESOCCLUDE SM WIDE 6/CT (CLIP) ×4 IMPLANT
COVER PROBE W GEL 5X96 (DRAPES) ×4 IMPLANT
COVER WAND RF STERILE (DRAPES) ×4 IMPLANT
DECANTER SPIKE VIAL GLASS SM (MISCELLANEOUS) ×4 IMPLANT
DERMABOND ADVANCED (GAUZE/BANDAGES/DRESSINGS) ×4
DERMABOND ADVANCED .7 DNX12 (GAUZE/BANDAGES/DRESSINGS) ×4 IMPLANT
DRAPE C-ARM 42X72 X-RAY (DRAPES) ×4 IMPLANT
ELECT REM PT RETURN 9FT ADLT (ELECTROSURGICAL) ×4
ELECTRODE REM PT RTRN 9FT ADLT (ELECTROSURGICAL) ×2 IMPLANT
GLOVE BIO SURGEON STRL SZ7.5 (GLOVE) ×4 IMPLANT
GLOVE BIOGEL PI IND STRL 6.5 (GLOVE) ×2 IMPLANT
GLOVE BIOGEL PI IND STRL 8 (GLOVE) ×2 IMPLANT
GLOVE BIOGEL PI INDICATOR 6.5 (GLOVE) ×2
GLOVE BIOGEL PI INDICATOR 8 (GLOVE) ×2
GOWN STRL REUS W/ TWL LRG LVL3 (GOWN DISPOSABLE) ×6 IMPLANT
GOWN STRL REUS W/ TWL XL LVL3 (GOWN DISPOSABLE) ×6 IMPLANT
GOWN STRL REUS W/TWL LRG LVL3 (GOWN DISPOSABLE) ×6
GOWN STRL REUS W/TWL XL LVL3 (GOWN DISPOSABLE) ×6
GRAFT GORETEX 6X40 (Vascular Products) ×4 IMPLANT
HEMOSTAT SPONGE AVITENE ULTRA (HEMOSTASIS) IMPLANT
KIT BASIN OR (CUSTOM PROCEDURE TRAY) ×4 IMPLANT
KIT TURNOVER KIT B (KITS) ×4 IMPLANT
NEEDLE 18GX1X1/2 (RX/OR ONLY) (NEEDLE) ×4 IMPLANT
NS IRRIG 1000ML POUR BTL (IV SOLUTION) ×4 IMPLANT
PACK CV ACCESS (CUSTOM PROCEDURE TRAY) ×4 IMPLANT
PAD ARMBOARD 7.5X6 YLW CONV (MISCELLANEOUS) ×8 IMPLANT
STAPLER VISISTAT 35W (STAPLE) IMPLANT
SUT ETHILON 3 0 PS 1 (SUTURE) ×4 IMPLANT
SUT MNCRL AB 4-0 PS2 18 (SUTURE) ×20 IMPLANT
SUT PROLENE 5 0 C 1 24 (SUTURE) ×4 IMPLANT
SUT PROLENE 6 0 BV (SUTURE) ×8 IMPLANT
SUT PROLENE 7 0 BV 1 (SUTURE) IMPLANT
SUT VIC AB 3-0 SH 27 (SUTURE) ×4
SUT VIC AB 3-0 SH 27X BRD (SUTURE) ×4 IMPLANT
SYR 10ML LL (SYRINGE) ×4 IMPLANT
SYR CONTROL 10ML LL (SYRINGE) ×4 IMPLANT
TOWEL GREEN STERILE (TOWEL DISPOSABLE) ×4 IMPLANT
UNDERPAD 30X30 (UNDERPADS AND DIAPERS) ×4 IMPLANT
WATER STERILE IRR 1000ML POUR (IV SOLUTION) ×4 IMPLANT

## 2018-03-22 NOTE — Consult Note (Addendum)
Hospital Consult   Reason for Consult:  Bleeding L arm AV graft Requesting Physician:  ED Dr. Leonette Monarch MRN #:  [id number]  History of Present Illness: This is a 27 y.o. male with PMH significant for diabetes, HTN, and ESRD on hemodialysis.  He is well known to vascular surgery having had a L arm fistula that occluded and L upper arm loop graft was placed by Dr. Donnetta Hutching 11/2016.  He had a bleeding episode from scabbing overlying graft about 2 weeks ago that was controlled in the emergency department.  This area began to bleed again last night which was stopped with direct pressure.  He experienced bleeding a third time during HD this morning and was brought to ED.  He is not taking any blood thinners.  Past Medical History:  Diagnosis Date  . Anemia   . Chronic kidney disease    ARF on CRF Dialysis T/TH/Sa  . Diabetes mellitus    Type II  . GERD (gastroesophageal reflux disease)   . Hypertension   . Morbid obesity ([place])   . Sacral wound   . Thyroid disease     Past Surgical History:  Procedure Laterality Date  . AV FISTULA PLACEMENT Left 09/26/2016   Procedure: LEFT UPPER ARM ARTERIOVENOUS (AV) FISTULA CREATION;  Surgeon: Elam Dutch, MD;  Location: [facility] OR;  Service: Vascular;  Laterality: Left;  . AV FISTULA PLACEMENT Left 12/02/2016   Procedure: INSERTION OF ARTERIOVENOUS GORE-TEX GRAFT LEFT UPPER  ARM USING A 4-7MM BY 45CM GRAFT ;  Surgeon: Rosetta Posner, MD;  Location: [facility];  Service: Vascular;  Laterality: Left;  . EXCHANGE OF A DIALYSIS CATHETER Right 10/08/2016   Procedure: EXCHANGE OF A DIALYSIS CATHETER-RIGHT INTERNAL JUGULAR;  Surgeon: Conrad Mekoryuk, MD;  Location: [facility];  Service: Vascular;  Laterality: Right;  . INSERTION OF DIALYSIS CATHETER Right 09/26/2016   Procedure: INSERTION OF DIALYSIS CATHETER - Right Internal Jugular Placement;  Surgeon: Elam Dutch, MD;  Location: [facility];  Service: Vascular;  Laterality: Right;    Allergies  Allergen Reactions  .  Hydralazine Hcl Other (See Comments)    DRUG-INDUCED LUPUS    Prior to Admission medications   Medication Sig Start Date End Date Taking? Authorizing Provider  ACCU-CHEK SOFTCLIX LANCETS lancets Use as instructed to test blood glucose four times daily before meals and at bedtime. ICD-10 code: E11.65. 12/02/16   Nicolette Bang, DO  acetaminophen (TYLENOL) 325 MG tablet Take 2 tablets (650 mg total) by mouth every 6 (six) hours as needed for mild pain or headache. Patient not taking: Reported on 03/11/2018 09/28/16   Robbie Lis, MD  amLODipine (NORVASC) 10 MG tablet Take 1 tablet (10 mg total) by mouth daily. 01/11/17   Nicolette Bang, DO  Blood Glucose Monitoring Suppl (ACCU-CHEK AVIVA PLUS) w/Device KIT 1 kit by Does not apply route daily. 12/01/16   Carlyle Dolly, MD  Blood Glucose Monitoring Suppl (ACCU-CHEK NANO SMARTVIEW) W/DEVICE KIT 1 Device by Does not apply route 4 (four) times daily -  before meals and at bedtime. 03/21/14   Mariel Aloe, MD  calcium acetate (PHOSLO) 667 MG capsule Take 2 capsules (1,334 mg total) by mouth 3 (three) times daily with meals. Patient not taking: Reported on 03/11/2018 10/25/16   Nicolette Bang, DO  glucose blood (ACCU-CHEK AVIVA) test strip Check sugar 6 x daily 12/01/16   Carlyle Dolly, MD  insulin glargine (LANTUS) 100 UNIT/ML injection Inject 0.2 mLs (20  Units total) into the skin 2 (two) times daily. 11/27/17   Everrett Coombe, MD  levothyroxine (SYNTHROID, LEVOTHROID) 175 MCG tablet TAKE 1 TABLET BY MOUTH ONCE DAILY **NEEDS  OFFICE  VISIT** Patient taking differently: Take 175 mcg by mouth daily before breakfast.  11/17/17   Nicolette Bang, DO  multivitamin (RENA-VIT) TABS tablet Take 1 tablet by mouth at bedtime. 10/25/16   Nicolette Bang, DO  oxyCODONE-acetaminophen (PERCOCET/ROXICET) 5-325 MG tablet Take 1 tablet by mouth every 6 (six) hours as needed. Patient not taking: Reported on  03/11/2018 12/02/16   [name], PA-C  rosuvastatin (CRESTOR) 20 MG tablet Take 1 tablet (20 mg total) by mouth daily. 12/03/17   Everrett Coombe, MD  sevelamer carbonate (RENVELA) 800 MG tablet Take 2,400 mg by mouth 3 (three) times daily with meals.    [provider]    Social History   Socioeconomic History  . Marital status: Single    Spouse name: Not on file  . Number of children: Not on file  . Years of education: Not on file  . Highest education level: Not on file  Occupational History  . Not on file  Social Needs  . Financial resource strain: Not on file  . Food insecurity:    Worry: Not on file    Inability: Not on file  . Transportation needs:    Medical: Not on file    Non-medical: Not on file  Tobacco Use  . Smoking status: Never Smoker  . Smokeless tobacco: Never Used  Substance and Sexual Activity  . Alcohol use: No  . Drug use: No  . Sexual activity: Not on file  Lifestyle  . Physical activity:    Days per week: Not on file    Minutes per session: Not on file  . Stress: Not on file  Relationships  . Social connections:    Talks on phone: Not on file    Gets together: Not on file    Attends religious service: Not on file    Active member of club or organization: Not on file    Attends meetings of clubs or organizations: Not on file    Relationship status: Not on file  . Intimate partner violence:    Fear of current or ex partner: Not on file    Emotionally abused: Not on file    Physically abused: Not on file    Forced sexual activity: Not on file  Other Topics Concern  . Not on file  Social History Narrative  . Not on file     Family History  Problem Relation Age of Onset  . Heart disease Mother   . Hypertension Mother     ROS: Otherwise negative unless mentioned in HPI  Physical Examination  Vitals:   03/22/18 0741  BP: (!) 142/95  Pulse: (!) 101  Resp: 16  Temp: 98.1 F (36.7 C)  SpO2: 96%   There is no height or  weight on file to calculate BMI.  General:  Obese, NAD Gait: Not observed HENT: WNL, normocephalic Pulmonary: normal non-labored breathing, without Rales, rhonchi,  wheezing Cardiac: tachycardic Abdomen:  soft, NT/ND, no masses Skin: without rashes Vascular Exam/Pulses: palpable L radial pulse Extremities: palpable thrill and audible bruit L arm loop graft.  Active bleeding from AV graft under pressure dressing Musculoskeletal: no muscle wasting or atrophy  Neurologic: A&O X 3;  No focal weakness or paresthesias are detected; speech is fluent/normal Psychiatric:  The pt has Normal  affect. Lymph:  Unremarkable  CBC    Component Value Date/Time   WBC 9.5 09/29/2016 0912   RBC 3.92 (L) 09/29/2016 0912   HGB 12.2 (L) 03/11/2018 0510   HCT 36.0 (L) 03/11/2018 0510   PLT 231 09/29/2016 0912   MCV 76.0 (L) 09/29/2016 0912   MCH 25.3 (L) 09/29/2016 0912   MCHC 33.2 09/29/2016 0912   RDW 17.6 (H) 09/29/2016 0912   LYMPHSABS 2.8 09/26/2016 0214   MONOABS 1.1 (H) 09/26/2016 0214   EOSABS 0.5 09/26/2016 0214   BASOSABS 0.1 09/26/2016 0214    BMET    Component Value Date/Time   NA 138 03/11/2018 0510   NA 143 11/16/2017 1033   K 4.6 03/11/2018 0510   CL 98 03/11/2018 0510   CO2 24 11/16/2017 1033   GLUCOSE 196 (H) 03/11/2018 0510   BUN 35 (H) 03/11/2018 0510   BUN 44 (H) 11/16/2017 1033   CREATININE 15.30 (H) 03/11/2018 0510   CREATININE 1.85 (H) 11/28/2014 1153   CALCIUM 7.8 (L) 11/16/2017 1033   GFRNONAA 4 (L) 11/16/2017 1033   GFRNONAA 51 (L) 11/28/2014 1153   GFRAA 5 (L) 11/16/2017 1033   GFRAA 58 (L) 11/28/2014 1153    COAGS: Lab Results  Component Value Date   INR 1.44 09/11/2016     ASSESSMENT/PLAN: This is a 27 y.o. male with several bleeding episodes from L arm upper arm loop graft  -AV graft continues to bleed under pressure dressing when seen in ED -Graft remains patent with a palpable thrill; palpable L radial pulse - Plan will be to take patient  urgently to OR for revision of L arm AV graft; also explained to patient if unable to revise, he will need a TDC placed at the time of surgery -Risk, benefits, and alternatives to access surgery were discussed.   -This patient was seen in conjunction with on call Vascular surgeon Dr. Carlis Abbott -He wishes to proceed with the above procedure - NPO, order placed for consent   Dagoberto Ligas PA-C [facility] [phone]  I have seen and evaluated the patient. I agree with the PA note as documented above. Bleeding from left upper arm loop graft.  Will take to OR for revision vs graft ligation and possible TDC.   Marty Heck, MD [facility] Office: [phone] Pager: [phone]

## 2018-03-22 NOTE — ED Triage Notes (Signed)
Pt here for evaluation of bleeding from left arm AV graft. Pt reports bleeding began last night and he was able to control it at home. Bleeding resumed this morning at dialysis appointment when attempting to access the graft. Receives dialysis Tues Thurs Saturday. Bleeding is controlled at this time. Estimated 50-100cc blood loss by EMS.

## 2018-03-22 NOTE — Anesthesia Preprocedure Evaluation (Signed)
Anesthesia Evaluation  Patient identified by MRN, date of birth, ID band Patient awake    Reviewed: Allergy & Precautions, H&P , NPO status , Patient's Chart, lab work & pertinent test results  Airway Mallampati: II   Neck ROM: full    Dental  (+) Teeth Intact, Dental Advisory Given   Pulmonary pneumonia,    Pulmonary exam normal breath sounds clear to auscultation       Cardiovascular hypertension, Pt. on medications Normal cardiovascular exam Rhythm:regular Rate:Normal  Echo 09/2016 - Left ventricle: The cavity size was normal. Wall thickness was increased in a pattern of moderate LVH. Systolic function was vigorous. The estimated ejection fraction was in the range of 65% to 70%. - Pericardium, extracardiac: Moderate pericardial effusion present surrounding heart. Measures approximately 13 mm in maximal dimension. There does not appear to be echo evid for tamponade. A small to moderate pericardial effusion was identified. - Impressions: In comparison to echo from 5/1 effusion is smaller   Neuro/Psych    GI/Hepatic GERD  ,  Endo/Other  diabetes, Type 2, Insulin DependentHypothyroidism Morbid obesity  Renal/GU ESRF and DialysisRenal disease     Musculoskeletal   Abdominal (+) + obese,   Peds  Hematology  (+) anemia ,   Anesthesia Other Findings   Reproductive/Obstetrics                             Anesthesia Physical  Anesthesia Plan  ASA: III  Anesthesia Plan: General   Post-op Pain Management:    Induction: Intravenous  PONV Risk Score and Plan: 2 and Ondansetron, Dexamethasone, Treatment may vary due to age or medical condition and Midazolam  Airway Management Planned: LMA  Additional Equipment: None  Intra-op Plan:   Post-operative Plan: Extubation in OR  Informed Consent: I have reviewed the patients History and Physical, chart, labs and discussed the procedure including  the risks, benefits and alternatives for the proposed anesthesia with the patient or authorized representative who has indicated his/her understanding and acceptance.   Dental advisory given  Plan Discussed with: CRNA  Anesthesia Plan Comments:         Anesthesia Quick Evaluation                                   Anesthesia Evaluation  Patient identified by MRN, date of birth, ID band Patient awake    Reviewed: Allergy & Precautions, NPO status , Patient's Chart, lab work & pertinent test results  Airway Mallampati: II  TM Distance: >3 FB Neck ROM: Full    Dental  (+) Teeth Intact, Dental Advisory Given   Pulmonary    breath sounds clear to auscultation       Cardiovascular hypertension,  Rhythm:Regular Rate:Normal     Neuro/Psych    GI/Hepatic   Endo/Other  diabetes  Renal/GU      Musculoskeletal   Abdominal (+) + obese,   Peds  Hematology   Anesthesia Other Findings   Reproductive/Obstetrics                            Anesthesia Physical Anesthesia Plan  ASA: III  Anesthesia Plan: General   Post-op Pain Management:    Induction: Intravenous  PONV Risk Score and Plan: Ondansetron  Airway Management Planned: Oral ETT  Additional Equipment:   Intra-op Plan:   Post-operative  Plan: Extubation in OR  Informed Consent: I have reviewed the patients History and Physical, chart, labs and discussed the procedure including the risks, benefits and alternatives for the proposed anesthesia with the patient or authorized representative who has indicated his/her understanding and acceptance.   Dental advisory given  Plan Discussed with: CRNA and Anesthesiologist  Anesthesia Plan Comments:        Anesthesia Quick Evaluation

## 2018-03-22 NOTE — Discharge Instructions (Signed)
° °  [facility] ° °Discharge Instructions ° °AV Fistula or Graft Surgery for Dialysis Access ° °Please refer to the following instructions for your post-procedure care. Your surgeon or physician assistant will discuss any changes with you. ° °Activity ° °You may drive the day following your surgery, if you are comfortable and no longer taking prescription pain medication. Resume full activity as the soreness in your incision resolves. ° °Bathing/Showering ° °You may shower after you go home. Keep your incision dry for 48 hours. Do not soak in a bathtub, hot tub, or swim until the incision heals completely. You may not shower if you have a hemodialysis catheter. ° °Incision Care ° °Clean your incision with mild soap and water after 48 hours. Pat the area dry with a clean towel. You do not need a bandage unless otherwise instructed. Do not apply any ointments or creams to your incision. You may have skin glue on your incision. Do not peel it off. It will come off on its own in about one week. Your arm may swell a bit after surgery. To reduce swelling use pillows to elevate your arm so it is above your heart. Your doctor will tell you if you need to lightly wrap your arm with an ACE bandage. ° °Diet ° °Resume your normal diet. There are not special food restrictions following this procedure. In order to heal from your surgery, it is CRITICAL to get adequate nutrition. Your body requires vitamins, minerals, and protein. Vegetables are the best source of vitamins and minerals. Vegetables also provide the perfect balance of protein. Processed food has little nutritional value, so try to avoid this. ° °Medications ° °Resume taking all of your medications. If your incision is causing pain, you may take over-the counter pain relievers such as acetaminophen (Tylenol). If you were prescribed a stronger pain medication, please be aware these medications can cause nausea and constipation. Prevent  nausea by taking the medication with a snack or meal. Avoid constipation by drinking plenty of fluids and eating foods with high amount of fiber, such as fruits, vegetables, and grains. Do not take Tylenol if you are taking prescription pain medications. ° ° ° ° °Follow up °Your surgeon may want to see you in the office following your access surgery. If so, this will be arranged at the time of your surgery. ° °Please call us immediately for any of the following conditions: ° °Increased pain, redness, drainage (pus) from your incision site °Fever of 101 degrees or higher °Severe or worsening pain at your incision site °Hand pain or numbness. ° °Reduce your risk of vascular disease: ° °Stop smoking. If you would like help, call [name] at 1-800-QUIT-NOW (1-800-784-8669) or [place] at 336-586-4000 ° °Manage your cholesterol °Maintain a desired weight °Control your diabetes °Keep your blood pressure down ° °Dialysis ° °It will take several weeks to several months for your new dialysis access to be ready for use. Your surgeon will determine when it is OK to use it. Your nephrologist will continue to direct your dialysis. You can continue to use your Permcath until your new access is ready for use. ° °If you have any questions, please call the office at [phone]. ° °

## 2018-03-22 NOTE — ED Notes (Signed)
Patient moved to room C25 d/t vascular access problem. Bleeding resumed and PA Sophia made aware. This RN and Sophia PA re-dressed fistula and controlled bleeding for the time being. Pt placed on cardiac monitoring. Vital signs stable. Pt denies dizziness/lightheadedness at this time. Will continue to monitor for bleeding etc.

## 2018-03-22 NOTE — ED Notes (Signed)
IV team bedside. 

## 2018-03-22 NOTE — Op Note (Signed)
Date: March 22, 2018  Preoperative diagnosis: Active bleeding from left upper extremity AV loop graft with overlying ulceration  Postoperative diagnosis: Same  Seizure: 1.  Ultrasound-guided access of the left internal jugular vein 2.  Placement of left IJ tunneled dialysis catheter (27 cm palindrome catheter) 3.  Thrombectomy of left upper extremity AV loop graft 4.  Revision of left upper extremity AV loop graft (6 mm PTFE interposition graft)  Surgeon: Dr. Marty Heck, MD  Assistant: Arlee Muslim, PA  Indications: Patient is a 27 year old male who presented to the ED this morning with active bleeding from his left upper extremity AV loop graft.  A pressure dressing was placed in the ED and vascular surgery was subsequently consulted.  Patient states he had overlying skin ulceration that has been thinning for some time and became progressively worse.  He presents to the OR after risks and benefits were discussed with patient.   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  Initially placed left IJ tunneled dialysis catheter in the right atrium for his hemodialysis needs.  We separately revised his left upper extremity AV loop graft using a new 6 mm PTFE graft tunneled around his existing graft for interposition and exclusion of the ulcerated segment.  Complications: None  Details: Patient was taken to operating room after informed consent was obtained.  He was placed on the operative table in the supine position.  His bilateral neck as well as his left arm were prepped and draped in usual sterile fashion.  Initially used ultrasound guidance to access is right internal jugular vein.  We were able to get venous backbleeding and we could confirm that the needle was indeed in the jugular vein but unfortunately the J wire would not advance and suspect he has a central occlusion.  As a result we then turned  his head and then accessed his left IJ and got a J-wire to advance into his right atrium.  Over this we made a separate stab incision on his left chest wall and at the needle insertion site in his left neck and then tunneled from the exit site to the needle insertion site and brought out our 27 cm palindrome catheter.  We then dilated sequentially with increasing dilators over the wire until he had a dilator peel-away sheath through the left IJ into the right atrium.  We then placed the tip of the catheter under fluoroscopy until it was in the right atrium.  We had a lot of difficulty getting the catheter to advance in the right atrium and it kept wanting to coil in the superior vena cava but after multiple attempts we were finally able to get it to advance accordingly.  At this point in time the peel-away sheath was pulled out.  The Monocryl 4-0 subcuticular stitch was placed at the neck incision and then we placed two 3-0 nylon sutures and the catheter in his chest wall.  I then turned my attention to his left upper extremity AV graft.  Initially made a transverse incision over the arterial limb of his graft in his upper arm and got inflow control with a fistula clamp.  The patient was given 5000 units of IV heparin prior to getting control.  I then went distal to the venous outflow of the graft past the ulcerated open segment as well as another skin thinning segment and got cut down with another transverse incision  to control of the venous outflow.  The graft was clamped here as well ultimately the blind limb of the graft was then transected and then we brought and tunneled a new 6 mm PTFE lateral and inferior to his existing graft in his left upper arm using a curved tunneler.  We then sewed both the proximal and distal anastomosis to the arterial and venous limbs using 6-0 Prolene in running fashion.  The graft was de-aired.  Prior to completion we did have trouble getting venous back bleeding and passed a #3  Fogarty catheter and retrieved some fresh thrombus.  At the end of the case he had a good thrill in the native limbs of the graft.  Both incisions were then washed out with saline until effluent was clear.  Subcutaneous tissue was closed with 3-0 Vicryl in running fashion the skin was closed with 4-0 Monocryl.  He was taken to PACU in stable condition.  Anesthesia: LMA  Marty Heck, MD [facility] Office: [phone] Pager: [place]

## 2018-03-22 NOTE — Anesthesia Procedure Notes (Addendum)
Procedure Name: Intubation Date/Time: 03/22/2018 12:21 PM Performed by: Bryson Corona, CRNA Pre-anesthesia Checklist: Patient identified, Emergency Drugs available, Suction available and Patient being monitored Patient Re-evaluated:Patient Re-evaluated prior to induction Oxygen Delivery Method: Circle System Utilized Preoxygenation: Pre-oxygenation with 100% oxygen Induction Type: IV induction Ventilation: Oral airway inserted - appropriate to patient size Laryngoscope Size: Mac and 4 Grade View: Grade I Tube type: Oral Tube size: 7.0 mm Number of attempts: 1 Airway Equipment and Method: Stylet and Oral airway Placement Confirmation: ETT inserted through vocal cords under direct vision,  positive ETCO2 and breath sounds checked- equal and bilateral Secured at: 23 cm Tube secured with: Tape Dental Injury: Teeth and Oropharynx as per pre-operative assessment

## 2018-03-22 NOTE — ED Provider Notes (Signed)
[facility] EMERGENCY DEPARTMENT Provider Note   CSN: [id number] Arrival date & time: 03/22/18  0736     History   Chief Complaint Chief Complaint  Patient presents with  . fistula problem    HPI Paul [name] is a 27 y.o. male presenting for evaluation of bleeding from the fistula.  Patient states he has had a AV fistula/graft of his left arm for the past year.  This was put in by Dr. Donnetta Hutching.  2 weeks ago, a scab fell off, and he had bleeding.  He was seen at the ER, during which bleeding was managed.  He has had intermittent bleeding since.  Last night, he had some bleeding, which was controlled with direct pressure for 15 minutes.  Today at dialysis, patient had repeat bleeding.  Is difficult to control, patient was sent to the ER for further evaluation.  After several hours of a pressure dressing, patient had continued heavy bleeding from the site.  He denies feeling faint or dizzy.  He denies chest pain or shortness of breath.  He is not on blood thinners.  He denies trauma or injury.  He reports a history of hypertension and diabetes which is well controlled.  He denies other medical problems.  HPI  Past Medical History:  Diagnosis Date  . Anemia   . Chronic kidney disease    ARF on CRF Dialysis T/TH/Sa  . Diabetes mellitus    Type II  . GERD (gastroesophageal reflux disease)   . Hypertension   . Morbid obesity ([place])   . Sacral wound   . Thyroid disease     Patient Active Problem List   Diagnosis Date Noted  . ESRD on dialysis ([place]) 11/16/2017  . VAP (ventilator-associated pneumonia) ([place])   . HAP (hospital-acquired pneumonia)   . Respiratory distress   . Acute hypoxemic respiratory failure ([place]) 09/11/2016  . Pressure injury of skin 09/11/2016  . Metabolic acidosis   . Acute pulmonary edema ([facility])   . Hypothyroidism 10/29/2009  . Type 2 diabetes mellitus ([place]) 04/14/2009  . Hyperlipidemia 04/14/2009  . OBESITY, MORBID 04/14/2009  . Essential  hypertension, benign 04/14/2009    Past Surgical History:  Procedure Laterality Date  . AV FISTULA PLACEMENT Left 09/26/2016   Procedure: LEFT UPPER ARM ARTERIOVENOUS (AV) FISTULA CREATION;  Surgeon: Elam Dutch, MD;  Location: [facility] OR;  Service: Vascular;  Laterality: Left;  . AV FISTULA PLACEMENT Left 12/02/2016   Procedure: INSERTION OF ARTERIOVENOUS GORE-TEX GRAFT LEFT UPPER  ARM USING A 4-7MM BY 45CM GRAFT ;  Surgeon: Rosetta Posner, MD;  Location: [facility];  Service: Vascular;  Laterality: Left;  . EXCHANGE OF A DIALYSIS CATHETER Right 10/08/2016   Procedure: EXCHANGE OF A DIALYSIS CATHETER-RIGHT INTERNAL JUGULAR;  Surgeon: Conrad Seldovia, MD;  Location: [facility];  Service: Vascular;  Laterality: Right;  . INSERTION OF DIALYSIS CATHETER Right 09/26/2016   Procedure: INSERTION OF DIALYSIS CATHETER - Right Internal Jugular Placement;  Surgeon: Elam Dutch, MD;  Location: [facility] OR;  Service: Vascular;  Laterality: Right;        Home Medications    Prior to Admission medications   Medication Sig Start Date End Date Taking? Authorizing Provider  acetaminophen (TYLENOL) 325 MG tablet Take 2 tablets (650 mg total) by mouth every 6 (six) hours as needed for mild pain or headache. 09/28/16  Yes Robbie Lis, MD  amLODipine (NORVASC) 10 MG tablet Take 1 tablet (10 mg total) by mouth daily. 01/11/17  Yes Nicolette Bang, DO  insulin glargine (LANTUS) 100 UNIT/ML injection Inject 0.2 mLs (20 Units total) into the skin 2 (two) times daily. 11/27/17  Yes Everrett Coombe, MD  levothyroxine (SYNTHROID, LEVOTHROID) 175 MCG tablet TAKE 1 TABLET BY MOUTH ONCE DAILY **NEEDS  OFFICE  VISIT** Patient taking differently: Take 175 mcg by mouth daily before breakfast.  11/17/17  Yes Nicolette Bang, DO  multivitamin (RENA-VIT) TABS tablet Take 1 tablet by mouth at bedtime. 10/25/16  Yes Nicolette Bang, DO  rosuvastatin (CRESTOR) 20 MG tablet Take 1 tablet (20 mg total) by mouth daily. 12/03/17   Yes Everrett Coombe, MD  sevelamer carbonate (RENVELA) 800 MG tablet Take 2,400 mg by mouth 3 (three) times daily with meals.   Yes [provider]  ACCU-CHEK SOFTCLIX LANCETS lancets Use as instructed to test blood glucose four times daily before meals and at bedtime. ICD-10 code: E11.65. 12/02/16   Nicolette Bang, DO  Blood Glucose Monitoring Suppl (ACCU-CHEK AVIVA PLUS) w/Device KIT 1 kit by Does not apply route daily. 12/01/16   Carlyle Dolly, MD  Blood Glucose Monitoring Suppl (ACCU-CHEK NANO SMARTVIEW) W/DEVICE KIT 1 Device by Does not apply route 4 (four) times daily -  before meals and at bedtime. 03/21/14   Mariel Aloe, MD  calcium acetate (PHOSLO) 667 MG capsule Take 2 capsules (1,334 mg total) by mouth 3 (three) times daily with meals. Patient not taking: Reported on 03/11/2018 10/25/16   Nicolette Bang, DO  glucose blood (ACCU-CHEK AVIVA) test strip Check sugar 6 x daily 12/01/16   Carlyle Dolly, MD  oxyCODONE-acetaminophen (PERCOCET/ROXICET) 5-325 MG tablet Take 1 tablet by mouth every 6 (six) hours as needed. Patient not taking: Reported on 03/11/2018 12/02/16   [name], PA-C    Family History Family History  Problem Relation Age of Onset  . Heart disease Mother   . Hypertension Mother     Social History Social History   Tobacco Use  . Smoking status: Never Smoker  . Smokeless tobacco: Never Used  Substance Use Topics  . Alcohol use: No  . Drug use: No     Allergies   Hydralazine hcl   Review of Systems Review of Systems  Skin: Positive for wound.  Neurological: Negative for numbness.  Hematological: Does not bruise/bleed easily.  All other systems reviewed and are negative.    Physical Exam Updated Vital Signs BP (!) 142/95 (BP Location: Right Arm)   Pulse (!) 101   Temp 98.1 F (36.7 C) (Oral)   Resp 16   SpO2 96%   Physical Exam  Constitutional: He is oriented to person, place, and time. He appears  well-developed and well-nourished. No distress.  Pt is anxious, but in NAD  HENT:  Head: Normocephalic and atraumatic.  Eyes: Pupils are equal, round, and reactive to light. Conjunctivae and EOM are normal.  Neck: Normal range of motion. Neck supple.  Cardiovascular: Regular rhythm and intact distal pulses.  Mildly tachycardic  Pulmonary/Chest: Effort normal and breath sounds normal. No respiratory distress. He has no wheezes.  Abdominal: Soft. He exhibits no distension. There is no tenderness.  Musculoskeletal: Normal range of motion.  Neurological: He is alert and oriented to person, place, and time. No sensory deficit.  Skin: Skin is warm and dry. Capillary refill takes less than 2 seconds.  Arterial bleed from the AV fistula when dressing is removed.  Controlled with direct pressure and a pressure bandage. Good cap refill.  Radial pulses intact.  Psychiatric: His mood appears anxious.  Pt appears very anxious.  Nursing note and vitals reviewed.    ED Treatments / Results  Labs (all labs ordered are listed, but only abnormal results are displayed) Labs Reviewed  CBC  BASIC METABOLIC PANEL    EKG None  Radiology No results found.  Procedures .Critical Care Performed by: Franchot Heidelberg, PA-C Authorized by: Franchot Heidelberg, PA-C   Critical care provider statement:    Critical care time (minutes):  45   Critical care time was exclusive of:  Separately billable procedures and treating other patients and teaching time   Critical care was necessary to treat or prevent imminent or life-threatening deterioration of the following conditions:  Circulatory failure   Critical care was time spent personally by me on the following activities:  Blood draw for specimens, development of treatment plan with patient or surrogate, discussions with consultants, evaluation of patient's response to treatment, examination of patient, obtaining history from patient or surrogate, ordering  and performing treatments and interventions, ordering and review of laboratory studies, ordering and review of radiographic studies, pulse oximetry, re-evaluation of patient's condition and review of old charts   I assumed direction of critical care for this patient from another provider in my specialty: no   Comments:     Pt with arterial bleed from AV fistula which continued to bleed under pressure dressing. Pt to go to OR emergently for AV revision    (including critical care time)   Medications Ordered in ED Medications  ceFAZolin (ANCEF) IVPB 2g/100 mL premix (has no administration in time range)     Initial Impression / Assessment and Plan / ED Course  I have reviewed the triage vital signs and the nursing notes.  Pertinent labs & imaging results that were available during my care of the patient were reviewed by me and considered in my medical decision making (see chart for details).     Patient presenting for evaluation of bleeding from his AV fistula site.  Patient is stable, heart rate mildly elevated but patient states he is very anxious.  Blood pressure stable.  Good radial pulses bilaterally.  Upon removal of the pressure dressing applied at dialysis, patient has arterial bleed, controlled with newly applied pressure dressing.  As this is the second time patient has been in the ER for bleeding the past 2 weeks, will consult with vascular for further management of the fistula. Case discussed with attending, Dr. Wilson Singer agrees to plan.   Patient has continued bleeding under the pressure dressing.  Vascular recommends emergent revision in the OR. Pt had increased bleeding under pressure dressing, new dressing applied while pt is waiting to go to OR. HR and blood pressure stable. Pt without dizziness or weakness.   Final Clinical Impressions(s) / ED Diagnoses   Final diagnoses:  Bleeding  Dialysis AV fistula malfunction, initial encounter [facility])    ED Discharge Orders    None         Franchot Heidelberg, PA-C 03/22/18 1057    Virgel Manifold, MD 03/23/18 1527

## 2018-03-22 NOTE — Transfer of Care (Signed)
Immediate Anesthesia Transfer of Care Note  Patient: Paul [name]  Procedure(s) Performed: LEFT UPPER EXTREMITY ARTERIOVENOUS  REVISION WITH GORE-TEX GRAFT. (Left Arm Upper) INSERTION OF DIALYSIS CATHETER (Left Chest)  Patient Location: PACU  Anesthesia Type:General  Level of Consciousness: drowsy  Airway & Oxygen Therapy: Patient Spontanous Breathing and Patient connected to face mask oxygen  Post-op Assessment: Report given to RN and Post -op Vital signs reviewed and stable  Post vital signs: Reviewed and stable  Last Vitals:  Vitals Value Taken Time  BP    Temp    Pulse 91 03/22/2018  2:23 PM  Resp 22 03/22/2018  2:23 PM  SpO2 100 % 03/22/2018  2:23 PM  Vitals shown include unvalidated device data.  Last Pain:  Vitals:   03/22/18 0745  TempSrc:   PainSc: 0-No pain         Complications: No apparent anesthesia complications

## 2018-03-23 ENCOUNTER — Encounter (HOSPITAL_COMMUNITY): Payer: Self-pay | Admitting: Vascular Surgery

## 2018-03-23 NOTE — Anesthesia Postprocedure Evaluation (Signed)
Anesthesia Post Note  Patient: Paul [name]  Procedure(s) Performed: LEFT UPPER EXTREMITY ARTERIOVENOUS  REVISION WITH GORE-TEX GRAFT. (Left Arm Upper) INSERTION OF DIALYSIS CATHETER (Left Chest)     Patient location during evaluation: PACU Anesthesia Type: General Level of consciousness: sedated and patient cooperative Pain management: pain level controlled Vital Signs Assessment: post-procedure vital signs reviewed and stable Respiratory status: spontaneous breathing Cardiovascular status: stable Anesthetic complications: no    Last Vitals:  Vitals:   03/22/18 1542 03/22/18 1615  BP:  125/71  Pulse:  79  Resp:  20  Temp: 36.5 C   SpO2:  99%    Last Pain:  Vitals:   03/22/18 1658  TempSrc:   PainSc: [place]

## 2018-03-28 ENCOUNTER — Ambulatory Visit (INDEPENDENT_AMBULATORY_CARE_PROVIDER_SITE_OTHER): Payer: Self-pay | Admitting: Physician Assistant

## 2018-03-28 ENCOUNTER — Other Ambulatory Visit: Payer: Self-pay

## 2018-03-28 VITALS — BP 170/105 | HR 99 | Temp 99.4°F | Resp 18 | Ht 74.0 in | Wt >= 6400 oz

## 2018-03-28 DIAGNOSIS — N186 End stage renal disease: Secondary | ICD-10-CM

## 2018-03-28 DIAGNOSIS — Z992 Dependence on renal dialysis: Secondary | ICD-10-CM

## 2018-03-28 NOTE — Progress Notes (Signed)
POST OPERATIVE OFFICE NOTE    CC:  F/u for surgery  HPI:  This is a 27 y.o. male who is s/p  Seizure: 1.  Ultrasound-guided access of the left internal jugular vein 2.  Placement of left IJ tunneled dialysis catheter (27 cm palindrome catheter) 3.  Thrombectomy of left upper extremity AV loop graft 4.  Revision of left upper extremity AV loop graft (6 mm PTFE interposition graft)  He denise numbness, pain or loss of motor in the left UE.  He is currently on HD T-TH-Sat via [facility] on the left.    Allergies  Allergen Reactions  . Hydralazine Hcl Other (See Comments)    DRUG-INDUCED LUPUS    Current Outpatient Medications  Medication Sig Dispense Refill  . ACCU-CHEK SOFTCLIX LANCETS lancets Use as instructed to test blood glucose four times daily before meals and at bedtime. ICD-10 code: E11.65. 200 each 3  . acetaminophen (TYLENOL) 325 MG tablet Take 2 tablets (650 mg total) by mouth every 6 (six) hours as needed for mild pain or headache. 30 tablet 0  . amLODipine (NORVASC) 10 MG tablet Take 1 tablet (10 mg total) by mouth daily. 90 tablet 3  . Blood Glucose Monitoring Suppl (ACCU-CHEK AVIVA PLUS) w/Device KIT 1 kit by Does not apply route daily. 1 kit 1  . Blood Glucose Monitoring Suppl (ACCU-CHEK NANO SMARTVIEW) W/DEVICE KIT 1 Device by Does not apply route 4 (four) times daily -  before meals and at bedtime. 1 kit 0  . cinacalcet (SENSIPAR) 30 MG tablet TAKE 1 TABLET BY MOUTH ONCE DAILY WITH DINNER DO NOT TAKE LESS THAN 12 HOURS PRIOR TO DIALYSIS  3  . glucose blood (ACCU-CHEK AVIVA) test strip Check sugar 6 x daily 200 each 3  . insulin glargine (LANTUS) 100 UNIT/ML injection Inject 0.2 mLs (20 Units total) into the skin 2 (two) times daily. 10 mL 11  . levothyroxine (SYNTHROID, LEVOTHROID) 175 MCG tablet TAKE 1 TABLET BY MOUTH ONCE DAILY **NEEDS  OFFICE  VISIT** (Patient taking differently: Take 175 mcg by mouth daily before breakfast. ) 30 tablet 3  . multivitamin (RENA-VIT) TABS  tablet Take 1 tablet by mouth at bedtime. 30 tablet 0  . oxyCODONE-acetaminophen (PERCOCET) 5-325 MG tablet Take 1 tablet by mouth every 6 (six) hours as needed for up to 15 doses for severe pain. 15 tablet 0  . rosuvastatin (CRESTOR) 20 MG tablet Take 1 tablet (20 mg total) by mouth daily. 90 tablet 3  . sevelamer carbonate (RENVELA) 800 MG tablet Take 2,400 mg by mouth 3 (three) times daily with meals.     No current facility-administered medications for this visit.      ROS:  See HPI  Physical Exam:  Vitals:   03/28/18 1316 03/28/18 1320  BP: (!) 198/115 (!) 170/105  Pulse: 99 99  Resp: 18   Temp: 99.4 F (37.4 C)   SpO2: 94%     Incision:  Well healed Extremities:  Palpable thrill in the loop graft, palpable radial pulse left UE, grip 5/5   Assessment/Plan:  This is a 27 y.o. male who is s/p: Seizure: 1.  Ultrasound-guided access of the left internal jugular vein 2.  Placement of left IJ tunneled dialysis catheter (27 cm palindrome catheter) 3.  Thrombectomy of left upper extremity AV loop graft 4.  Revision of left upper extremity AV loop graft (6 mm PTFE interposition graft)  We will wait 4 weeks from surgery 03/22/2018 until the graft is used.  Once it is successful he can be scheduled for [facility] removal.  He will f/u PRN in the future.   Roxy Horseman PA-C [facility] [phone]

## 2018-04-18 DIAGNOSIS — N186 End stage renal disease: Secondary | ICD-10-CM | POA: Diagnosis not present

## 2018-04-21 DIAGNOSIS — N186 End stage renal disease: Secondary | ICD-10-CM | POA: Diagnosis not present

## 2018-04-21 DIAGNOSIS — Z992 Dependence on renal dialysis: Secondary | ICD-10-CM | POA: Diagnosis not present

## 2018-04-21 DIAGNOSIS — E1129 Type 2 diabetes mellitus with other diabetic kidney complication: Secondary | ICD-10-CM | POA: Diagnosis not present

## 2018-04-30 NOTE — Telephone Encounter (Signed)
Error

## 2018-05-17 DIAGNOSIS — N186 End stage renal disease: Secondary | ICD-10-CM | POA: Diagnosis not present

## 2018-05-22 DIAGNOSIS — Z992 Dependence on renal dialysis: Secondary | ICD-10-CM | POA: Diagnosis not present

## 2018-05-22 DIAGNOSIS — N186 End stage renal disease: Secondary | ICD-10-CM | POA: Diagnosis not present

## 2018-05-22 DIAGNOSIS — E1129 Type 2 diabetes mellitus with other diabetic kidney complication: Secondary | ICD-10-CM | POA: Diagnosis not present

## 2018-05-29 DIAGNOSIS — Z7689 Persons encountering health services in other specified circumstances: Secondary | ICD-10-CM | POA: Diagnosis not present

## 2018-05-30 ENCOUNTER — Other Ambulatory Visit: Payer: Self-pay | Admitting: *Deleted

## 2018-05-30 DIAGNOSIS — E039 Hypothyroidism, unspecified: Secondary | ICD-10-CM

## 2018-05-30 DIAGNOSIS — Z452 Encounter for adjustment and management of vascular access device: Secondary | ICD-10-CM | POA: Diagnosis not present

## 2018-05-31 MED ORDER — LEVOTHYROXINE SODIUM 175 MCG PO TABS: 175.0000 ug | ORAL_TABLET | Freq: Every day | ORAL | 0 refills | Status: DC

## 2018-05-31 NOTE — Telephone Encounter (Signed)
efilled x1 month. Patient needs an appointment for repeat thyroid blood work. Please schedule an OV.

## 2018-06-05 DIAGNOSIS — Z7689 Persons encountering health services in other specified circumstances: Secondary | ICD-10-CM | POA: Diagnosis not present

## 2018-06-07 DIAGNOSIS — Z7689 Persons encountering health services in other specified circumstances: Secondary | ICD-10-CM | POA: Diagnosis not present

## 2018-06-09 DIAGNOSIS — Z7689 Persons encountering health services in other specified circumstances: Secondary | ICD-10-CM | POA: Diagnosis not present

## 2018-06-11 NOTE — Telephone Encounter (Signed)
Called pt. Voicemail not set up. If pt calls, please schedule an appt with Dr. Burr Medico. Ottis Stain, CMA

## 2018-06-12 NOTE — Telephone Encounter (Signed)
Contacted pt and scheduled an appointment. Katharina Caper, Erle Guster D, [place]

## 2018-06-14 DIAGNOSIS — Z7689 Persons encountering health services in other specified circumstances: Secondary | ICD-10-CM | POA: Diagnosis not present

## 2018-06-14 DIAGNOSIS — N186 End stage renal disease: Secondary | ICD-10-CM | POA: Diagnosis not present

## 2018-06-14 DIAGNOSIS — E1129 Type 2 diabetes mellitus with other diabetic kidney complication: Secondary | ICD-10-CM | POA: Diagnosis not present

## 2018-06-16 DIAGNOSIS — Z7689 Persons encountering health services in other specified circumstances: Secondary | ICD-10-CM | POA: Diagnosis not present

## 2018-06-19 DIAGNOSIS — Z7689 Persons encountering health services in other specified circumstances: Secondary | ICD-10-CM | POA: Diagnosis not present

## 2018-06-21 DIAGNOSIS — Z7689 Persons encountering health services in other specified circumstances: Secondary | ICD-10-CM | POA: Diagnosis not present

## 2018-06-22 DIAGNOSIS — Z992 Dependence on renal dialysis: Secondary | ICD-10-CM | POA: Diagnosis not present

## 2018-06-22 DIAGNOSIS — N186 End stage renal disease: Secondary | ICD-10-CM | POA: Diagnosis not present

## 2018-06-22 DIAGNOSIS — E1129 Type 2 diabetes mellitus with other diabetic kidney complication: Secondary | ICD-10-CM | POA: Diagnosis not present

## 2018-06-23 DIAGNOSIS — Z7689 Persons encountering health services in other specified circumstances: Secondary | ICD-10-CM | POA: Diagnosis not present

## 2018-06-28 DIAGNOSIS — Z7689 Persons encountering health services in other specified circumstances: Secondary | ICD-10-CM | POA: Diagnosis not present

## 2018-06-30 DIAGNOSIS — Z7689 Persons encountering health services in other specified circumstances: Secondary | ICD-10-CM | POA: Diagnosis not present

## 2018-07-03 DIAGNOSIS — Z7689 Persons encountering health services in other specified circumstances: Secondary | ICD-10-CM | POA: Diagnosis not present

## 2018-07-05 DIAGNOSIS — Z7689 Persons encountering health services in other specified circumstances: Secondary | ICD-10-CM | POA: Diagnosis not present

## 2018-07-07 DIAGNOSIS — Z7689 Persons encountering health services in other specified circumstances: Secondary | ICD-10-CM | POA: Diagnosis not present

## 2018-07-09 ENCOUNTER — Ambulatory Visit: Payer: Medicaid Other | Admitting: Student in an Organized Health Care Education/Training Program

## 2018-07-10 DIAGNOSIS — Z7689 Persons encountering health services in other specified circumstances: Secondary | ICD-10-CM | POA: Diagnosis not present

## 2018-07-12 DIAGNOSIS — Z7689 Persons encountering health services in other specified circumstances: Secondary | ICD-10-CM | POA: Diagnosis not present

## 2018-07-14 DIAGNOSIS — Z7689 Persons encountering health services in other specified circumstances: Secondary | ICD-10-CM | POA: Diagnosis not present

## 2018-07-16 ENCOUNTER — Other Ambulatory Visit: Payer: Self-pay

## 2018-07-16 DIAGNOSIS — E039 Hypothyroidism, unspecified: Secondary | ICD-10-CM

## 2018-07-16 DIAGNOSIS — I10 Essential (primary) hypertension: Secondary | ICD-10-CM

## 2018-07-16 MED ORDER — LEVOTHYROXINE SODIUM 175 MCG PO TABS: 175.0000 ug | ORAL_TABLET | Freq: Every day | ORAL | 0 refills | Status: DC

## 2018-07-16 MED ORDER — AMLODIPINE BESYLATE 10 MG PO TABS: 10.0000 mg | ORAL_TABLET | Freq: Every day | ORAL | 3 refills | Status: DC

## 2018-07-16 NOTE — Telephone Encounter (Signed)
Pts mom called nurse line stating she just made her son an apt for March. The patient will need a refill on the pended medications between now and then. Please advise.

## 2018-07-17 DIAGNOSIS — Z7689 Persons encountering health services in other specified circumstances: Secondary | ICD-10-CM | POA: Diagnosis not present

## 2018-07-19 DIAGNOSIS — Z7689 Persons encountering health services in other specified circumstances: Secondary | ICD-10-CM | POA: Diagnosis not present

## 2018-07-19 DIAGNOSIS — N186 End stage renal disease: Secondary | ICD-10-CM | POA: Diagnosis not present

## 2018-07-21 DIAGNOSIS — Z7689 Persons encountering health services in other specified circumstances: Secondary | ICD-10-CM | POA: Diagnosis not present

## 2018-07-21 DIAGNOSIS — E1129 Type 2 diabetes mellitus with other diabetic kidney complication: Secondary | ICD-10-CM | POA: Diagnosis not present

## 2018-07-21 DIAGNOSIS — N186 End stage renal disease: Secondary | ICD-10-CM | POA: Diagnosis not present

## 2018-07-21 DIAGNOSIS — Z992 Dependence on renal dialysis: Secondary | ICD-10-CM | POA: Diagnosis not present

## 2018-07-30 ENCOUNTER — Ambulatory Visit: Payer: Medicaid Other | Admitting: Student in an Organized Health Care Education/Training Program

## 2018-08-16 DIAGNOSIS — N186 End stage renal disease: Secondary | ICD-10-CM | POA: Diagnosis not present

## 2018-08-21 DIAGNOSIS — N186 End stage renal disease: Secondary | ICD-10-CM | POA: Diagnosis not present

## 2018-08-21 DIAGNOSIS — Z992 Dependence on renal dialysis: Secondary | ICD-10-CM | POA: Diagnosis not present

## 2018-08-21 DIAGNOSIS — E1129 Type 2 diabetes mellitus with other diabetic kidney complication: Secondary | ICD-10-CM | POA: Diagnosis not present

## 2018-09-13 DIAGNOSIS — N186 End stage renal disease: Secondary | ICD-10-CM | POA: Diagnosis not present

## 2018-09-13 DIAGNOSIS — E1129 Type 2 diabetes mellitus with other diabetic kidney complication: Secondary | ICD-10-CM | POA: Diagnosis not present

## 2018-09-20 DIAGNOSIS — N186 End stage renal disease: Secondary | ICD-10-CM | POA: Diagnosis not present

## 2018-09-20 DIAGNOSIS — Z992 Dependence on renal dialysis: Secondary | ICD-10-CM | POA: Diagnosis not present

## 2018-09-20 DIAGNOSIS — E1129 Type 2 diabetes mellitus with other diabetic kidney complication: Secondary | ICD-10-CM | POA: Diagnosis not present

## 2018-10-17 ENCOUNTER — Other Ambulatory Visit: Payer: Self-pay | Admitting: Student in an Organized Health Care Education/Training Program

## 2018-10-17 DIAGNOSIS — E039 Hypothyroidism, unspecified: Secondary | ICD-10-CM

## 2018-10-18 DIAGNOSIS — N186 End stage renal disease: Secondary | ICD-10-CM | POA: Diagnosis not present

## 2018-10-18 NOTE — Telephone Encounter (Signed)
Covering inbox for Dr. Burr Medico. Patient needs appointment- he has a lot of medical problems that should be monitored and it does not appear he has been seen in the past year which is concerning. Also unclear how he is taking this thyroid medication as it was last filled 3 months ago for a 30 day supply. Will refill for 30 days.

## 2018-10-18 NOTE — Telephone Encounter (Signed)
To white team. [name], CMA

## 2018-10-21 DIAGNOSIS — E1129 Type 2 diabetes mellitus with other diabetic kidney complication: Secondary | ICD-10-CM | POA: Diagnosis not present

## 2018-10-21 DIAGNOSIS — Z992 Dependence on renal dialysis: Secondary | ICD-10-CM | POA: Diagnosis not present

## 2018-10-21 DIAGNOSIS — N186 End stage renal disease: Secondary | ICD-10-CM | POA: Diagnosis not present

## 2018-11-15 DIAGNOSIS — N186 End stage renal disease: Secondary | ICD-10-CM | POA: Diagnosis not present

## 2018-11-20 DIAGNOSIS — Z992 Dependence on renal dialysis: Secondary | ICD-10-CM | POA: Diagnosis not present

## 2018-11-20 DIAGNOSIS — E1129 Type 2 diabetes mellitus with other diabetic kidney complication: Secondary | ICD-10-CM | POA: Diagnosis not present

## 2018-11-20 DIAGNOSIS — N186 End stage renal disease: Secondary | ICD-10-CM | POA: Diagnosis not present

## 2018-12-05 ENCOUNTER — Other Ambulatory Visit: Payer: Self-pay

## 2018-12-05 DIAGNOSIS — E039 Hypothyroidism, unspecified: Secondary | ICD-10-CM

## 2018-12-06 NOTE — Telephone Encounter (Signed)
Please call patient to set up follow up visit for thyroid check. He hasn't had a TSH in a year and was still titrating his medicine at that last visit. I will fill for one month, but let patient know that I will refuse all refills until he is seen.

## 2018-12-10 NOTE — Telephone Encounter (Signed)
Contacted paint to set up apt. Patient stated he goes to dialysis 3x a week and would rather not come here because of Covid. Patient will see if the kidney center would be willing to draw lab for him. Patient will call us back with information. Informed him he would need a lab draw regardless for any more medication. Patient understood.

## 2018-12-11 NOTE — Telephone Encounter (Signed)
Will continue to look for patient's lab result. If needed, I have put a standing order in for this patient for our clinic. Please schedule a virtual video visit. Thank you so much for all of your help with this!

## 2018-12-13 MED ORDER — LEVOTHYROXINE SODIUM 175 MCG PO TABS: ORAL_TABLET | ORAL | 0 refills | Status: DC

## 2018-12-15 DIAGNOSIS — E1129 Type 2 diabetes mellitus with other diabetic kidney complication: Secondary | ICD-10-CM | POA: Diagnosis not present

## 2018-12-15 DIAGNOSIS — N186 End stage renal disease: Secondary | ICD-10-CM | POA: Diagnosis not present

## 2018-12-21 DIAGNOSIS — N186 End stage renal disease: Secondary | ICD-10-CM | POA: Diagnosis not present

## 2018-12-21 DIAGNOSIS — Z992 Dependence on renal dialysis: Secondary | ICD-10-CM | POA: Diagnosis not present

## 2018-12-21 DIAGNOSIS — E1129 Type 2 diabetes mellitus with other diabetic kidney complication: Secondary | ICD-10-CM | POA: Diagnosis not present

## 2019-01-10 ENCOUNTER — Other Ambulatory Visit: Payer: Self-pay | Admitting: Student in an Organized Health Care Education/Training Program

## 2019-01-10 DIAGNOSIS — E1129 Type 2 diabetes mellitus with other diabetic kidney complication: Secondary | ICD-10-CM

## 2019-01-10 DIAGNOSIS — Z794 Long term (current) use of insulin: Secondary | ICD-10-CM

## 2019-01-10 NOTE — Telephone Encounter (Signed)
Paul [name],   I am refilling this patient's prescription for Lantus, but only once.  He hasn't been seen for a year.  It looks like he has a scheduled appointment with you on 01/16/2019.  If he no-shows, would you mind sending a message to reschedule this appointment and copy me to it? Please and thank yoU!!   Apolonio Schneiders

## 2019-01-15 ENCOUNTER — Other Ambulatory Visit: Payer: Self-pay | Admitting: Student in an Organized Health Care Education/Training Program

## 2019-01-16 ENCOUNTER — Ambulatory Visit: Payer: Medicaid Other | Admitting: Family Medicine

## 2019-01-17 DIAGNOSIS — N186 End stage renal disease: Secondary | ICD-10-CM | POA: Diagnosis not present

## 2019-01-21 DIAGNOSIS — N186 End stage renal disease: Secondary | ICD-10-CM | POA: Diagnosis not present

## 2019-01-21 DIAGNOSIS — E1129 Type 2 diabetes mellitus with other diabetic kidney complication: Secondary | ICD-10-CM | POA: Diagnosis not present

## 2019-01-21 DIAGNOSIS — Z992 Dependence on renal dialysis: Secondary | ICD-10-CM | POA: Diagnosis not present

## 2019-01-22 ENCOUNTER — Other Ambulatory Visit: Payer: Self-pay | Admitting: Family Medicine

## 2019-01-22 DIAGNOSIS — Z992 Dependence on renal dialysis: Secondary | ICD-10-CM

## 2019-01-22 DIAGNOSIS — N186 End stage renal disease: Secondary | ICD-10-CM

## 2019-01-22 DIAGNOSIS — E039 Hypothyroidism, unspecified: Secondary | ICD-10-CM

## 2019-01-22 DIAGNOSIS — I10 Essential (primary) hypertension: Secondary | ICD-10-CM

## 2019-01-30 ENCOUNTER — Encounter: Payer: Self-pay | Admitting: Family Medicine

## 2019-01-30 ENCOUNTER — Telehealth (INDEPENDENT_AMBULATORY_CARE_PROVIDER_SITE_OTHER): Payer: Medicaid Other | Admitting: Family Medicine

## 2019-01-30 ENCOUNTER — Other Ambulatory Visit: Payer: Self-pay

## 2019-01-30 DIAGNOSIS — I10 Essential (primary) hypertension: Secondary | ICD-10-CM | POA: Diagnosis not present

## 2019-01-30 DIAGNOSIS — Z794 Long term (current) use of insulin: Secondary | ICD-10-CM | POA: Diagnosis not present

## 2019-01-30 DIAGNOSIS — E785 Hyperlipidemia, unspecified: Secondary | ICD-10-CM | POA: Diagnosis not present

## 2019-01-30 DIAGNOSIS — E039 Hypothyroidism, unspecified: Secondary | ICD-10-CM

## 2019-01-30 DIAGNOSIS — E1129 Type 2 diabetes mellitus with other diabetic kidney complication: Secondary | ICD-10-CM | POA: Diagnosis not present

## 2019-01-30 DIAGNOSIS — Z992 Dependence on renal dialysis: Secondary | ICD-10-CM | POA: Diagnosis not present

## 2019-01-30 DIAGNOSIS — N186 End stage renal disease: Secondary | ICD-10-CM

## 2019-01-30 MED ORDER — INSULIN GLARGINE 100 UNIT/ML ~~LOC~~ SOLN: 10.0000 [IU] | Freq: Two times a day (BID) | SUBCUTANEOUS | 0 refills | Status: DC

## 2019-01-30 MED ORDER — LEVOTHYROXINE SODIUM 175 MCG PO TABS: ORAL_TABLET | ORAL | 0 refills | Status: DC

## 2019-01-30 MED ORDER — ROSUVASTATIN CALCIUM 20 MG PO TABS: 40.0000 mg | ORAL_TABLET | Freq: Every day | ORAL | 0 refills | Status: DC

## 2019-01-30 NOTE — Progress Notes (Signed)
Telemedicine Visit Patient consented to have visit conducted via telephone.  Encounter participants: Patient: Paul [name]  Provider: Wilber Oliphant, MD Others (if applicable): none  Subjective  Subjective  Patient ID: MRN [id number]  Date of birth: February 02, 1991   PCP: Wilber Oliphant, MD  CC: Medication Refills   HPI: Paul [name] is a 28 y.o. male with past medical history significant for ESRD, hypothyroidism, HTn, Diabetes, who presents today after several medication refill requrests despite not having been seen in clinic since 2019.   ESRD on Dialysis T Th S  Follows with [facility]. Sees Dr. Justin Mend about once a week and HD. His HD site is [facility] on [address]. Patient reports most recent dry weight is 165.5. He does note that his weight has lowered a little bit over the last few months.   HTN Well controlled with volume control. Still taking amlodipine.Taking medications as instructed, no medication side effects noted, no TIA's, no chest pain on exertion, no dyspnea on exertion and no swelling of ankles.   Diabetes  Patient reports that he has not checked his glucose in several months. He recently moved and reports that he needs a new meter, he thinks. He is currently taking 10-15 units of lantus twice daily depending on his meals and what he is eating. He denies any recent episodes of hypoglycemia, changes in vision.   Hypothyroid 175 mcg levothyroxine. Denies any changes in vision, tachycardia, bradycardia, confusion, headache.   HISTORY Medications, allergies, medical history, family history and social history were reviewed and edited as necessary. Pertinent findings included in HPI.  Social Hx: Tashon reports that he has never smoked. He has never used smokeless tobacco. He reports that he does not drink alcohol or use drugs. ROS: See HPI    Objective ECTIVEEND@ Objective  Observations:    Pertinent Labs & Imaging:  Reviewed  in chart as appropriate   Assessment  Assessment & Plan  ESRD on dialysis [facility]) Called over to dialysis center to get labs for this patient who directed me to [place]. [facility]A reported that patient has never been seen in the office since he has been on ESRD since 2018 and ESRD patient are seen in the HD centers only. After calling the dialysis center again, I was informed that patient's get labs weekly in the following pattern:  Hemoglobin q weekly Phos, calcium, BUN pre and post 2nd week of the month  Last week of the month, all of the labs.  The HD center is sending over all of the most recent labs to help titrate his medicaitons. I am unsure that they will be able to provide TSH, A1C, or lipid panel. If I am unable to get any of these labs, he will need to come into the office to get labs drawn.        Hypothyroidism Awaiting labs from HD center. Patient reports that he is on his last few pills of levothyroxine. Continues to take it daily and no side effects.  - refill x 1 month and call when results return for changes in dosing .   Essential hypertension, benign Well controlled with HD.   Type 2 diabetes mellitus [facility]) Patient previously followed by endocrinology in 2014, but no recent visits per chart history. Will refill lantus x 1 month. Patient will need new diabetic testing supplies. Given that DMII is better controlled in the setting of ESRD, will await A1C and labs to make any changes  at this time.  - Patient counseled on hypoglycemia symptoms - send monitoring kit to the pharmacy  - Patient will need A1C in office if not obtained from HD Site.   Hyperlipidemia Now taking 28m (261mnoted in our EMR). Updated chart and refilled.  - follow up lipid panel  Would also ideally like to get LFTs as patient has not had in two years and had a mildly elevated AST.   No orders of the defined types were placed in this encounter.   Time spent on phone with patient:  22 minutes Time spent coordinating care: 20 minutes    RaWilber OliphantM.D.  PGY-2  Family Medicine  [phone]/02/2019 3:28 PM

## 2019-01-31 MED ORDER — ONETOUCH ULTRASOFT LANCETS MISC: 12 refills | Status: DC

## 2019-01-31 MED ORDER — BD SYRINGE SLIP TIP 30 ML MISC: 1.0000 | Freq: Every day | 11 refills | Status: DC

## 2019-01-31 MED ORDER — GLUCOSE BLOOD VI STRP: ORAL_STRIP | 12 refills | Status: DC

## 2019-01-31 MED ORDER — ONETOUCH VERIO W/DEVICE KIT: 1.0000 | PACK | Freq: Every day | 0 refills | Status: DC

## 2019-01-31 NOTE — Assessment & Plan Note (Signed)
Called over to dialysis center to get labs for this patient who directed me to [place]. CKA reported that patient has never been seen in the office since he has been on ESRD since 2018 and ESRD patient are seen in the HD centers only. After calling the dialysis center again, I was informed that patient's get labs weekly in the following pattern:  Hemoglobin q weekly Phos, calcium, BUN pre and post 2nd week of the month  Last week of the month, all of the labs.  The HD center is sending over all of the most recent labs to help titrate his medicaitons. I am unsure that they will be able to provide TSH, A1C, or lipid panel. If I am unable to get any of these labs, he will need to come into the office to get labs drawn.

## 2019-01-31 NOTE — Assessment & Plan Note (Signed)
Patient previously followed by endocrinology in 2014, but no recent visits per chart history. Will refill lantus x 1 month. Patient will need new diabetic testing supplies. Given that DMII is better controlled in the setting of ESRD, will await A1C and labs to make any changes at this time.  - Patient counseled on hypoglycemia symptoms - send monitoring kit to the pharmacy  - Patient will need A1C in office if not obtained from HD Site.

## 2019-01-31 NOTE — Assessment & Plan Note (Addendum)
Now taking 40mg  (20mg  noted in our EMR). Updated chart and refilled.  - follow up lipid panel  Would also ideally like to get LFTs as patient has not had in two years and had a mildly elevated AST.

## 2019-01-31 NOTE — Assessment & Plan Note (Signed)
Well controlled with HD.

## 2019-01-31 NOTE — Assessment & Plan Note (Addendum)
Awaiting labs from HD center. Patient reports that he is on his last few pills of levothyroxine. Continues to take it daily and no side effects.  - refill x 1 month and call when results return for changes in dosing .

## 2019-02-01 ENCOUNTER — Telehealth: Payer: Self-pay | Admitting: *Deleted

## 2019-02-01 DIAGNOSIS — E1122 Type 2 diabetes mellitus with diabetic chronic kidney disease: Secondary | ICD-10-CM

## 2019-02-01 NOTE — Telephone Encounter (Signed)
Fax received from pharmacy stating that one touch verio that was originally sent to pharmacy is not covered by insurance.  Medicaid covers the accu-chek guide and supplies.  Orders pended and sent to PCP.  Jazmin Hartsell,CMA

## 2019-02-03 MED ORDER — ACCU-CHEK SOFT TOUCH LANCETS MISC: 12 refills | Status: DC

## 2019-02-03 MED ORDER — ACCU-CHEK GUIDE VI STRP: ORAL_STRIP | 12 refills | Status: DC

## 2019-02-03 MED ORDER — ACCU-CHEK GUIDE ME W/DEVICE KIT: 1.0000 | PACK | Freq: Every day | 0 refills | Status: DC

## 2019-02-05 MED ORDER — ACCU-CHEK GUIDE ME W/DEVICE KIT: 1.0000 | PACK | Freq: Every day | 0 refills | Status: DC

## 2019-02-05 MED ORDER — ACCU-CHEK SOFT TOUCH LANCETS MISC: 1.0000 | Freq: Every day | 12 refills | Status: DC

## 2019-02-05 MED ORDER — ACCU-CHEK GUIDE VI STRP: 1.0000 | ORAL_STRIP | Freq: Two times a day (BID) | 12 refills | Status: DC

## 2019-02-05 NOTE — Telephone Encounter (Signed)
Rx re-sent as requested.

## 2019-02-05 NOTE — Addendum Note (Signed)
Addended by: Zettie Cooley E on: 02/05/2019 01:46 PM   Modules accepted: Orders

## 2019-02-05 NOTE — Telephone Encounter (Signed)
Pharmacy sent fax.  The directions for meter and supplies needs to be resent with frequency and dx code.  [name], CMA

## 2019-02-14 DIAGNOSIS — N186 End stage renal disease: Secondary | ICD-10-CM | POA: Diagnosis not present

## 2019-02-20 DIAGNOSIS — Z992 Dependence on renal dialysis: Secondary | ICD-10-CM | POA: Diagnosis not present

## 2019-02-20 DIAGNOSIS — E1129 Type 2 diabetes mellitus with other diabetic kidney complication: Secondary | ICD-10-CM | POA: Diagnosis not present

## 2019-02-20 DIAGNOSIS — N186 End stage renal disease: Secondary | ICD-10-CM | POA: Diagnosis not present

## 2019-02-26 ENCOUNTER — Encounter: Payer: Self-pay | Admitting: Family Medicine

## 2019-02-26 DIAGNOSIS — E039 Hypothyroidism, unspecified: Secondary | ICD-10-CM

## 2019-03-01 ENCOUNTER — Other Ambulatory Visit: Payer: Self-pay | Admitting: Family Medicine

## 2019-03-01 DIAGNOSIS — E039 Hypothyroidism, unspecified: Secondary | ICD-10-CM

## 2019-03-12 NOTE — Progress Notes (Signed)
-  Discussed with patient his need to do lab values.  We will try to arrange for him to get these labs done during his dialysis.  Unfortunately, they are unable to perform TSH which was the lab that we have needed to titrate his medication.  Attempted to call patient at two different numbers listed.   Will send this to RN team to schedule a lab visit for him.   Wilber Oliphant, M.D.  11:12 AM 03/12/2019

## 2019-03-14 ENCOUNTER — Telehealth: Payer: Self-pay

## 2019-03-14 ENCOUNTER — Other Ambulatory Visit: Payer: Self-pay | Admitting: Family Medicine

## 2019-03-14 DIAGNOSIS — E1129 Type 2 diabetes mellitus with other diabetic kidney complication: Secondary | ICD-10-CM

## 2019-03-14 NOTE — Telephone Encounter (Signed)
Called both numbers in chart. No answer, no VM. If pt calls, please schedule lab visit for TSH. Ottis Stain, CMA

## 2019-03-14 NOTE — Telephone Encounter (Signed)
-----   Message from Bobbye Riggs, [place] sent at 03/12/2019  1:19 PM EDT ----- Please call this patient to schedule lab visit. We have already tried to obtain labs through his dialysis center, but they are not able to get TSH. He must come in for at least a lab visit. Please & thank you so much, Apolonio Schneiders

## 2019-03-15 ENCOUNTER — Other Ambulatory Visit: Payer: Self-pay | Admitting: Family Medicine

## 2019-03-15 DIAGNOSIS — E1122 Type 2 diabetes mellitus with diabetic chronic kidney disease: Secondary | ICD-10-CM

## 2019-03-15 DIAGNOSIS — E039 Hypothyroidism, unspecified: Secondary | ICD-10-CM

## 2019-03-15 NOTE — Telephone Encounter (Signed)
Spoke to patient's mother over the phone upon calling [phone].  I asked him at length as he is taking daily as I do not have a recent A1c on him.  She reports that he is taking 10 to 15 units daily.  I will refill this prescription but will not refill until patient comes into the lab to get A1c values and other requested labs.  Patient is aware to call family medicine center and make a lab appointment.

## 2019-03-21 DIAGNOSIS — E1129 Type 2 diabetes mellitus with other diabetic kidney complication: Secondary | ICD-10-CM | POA: Diagnosis not present

## 2019-03-21 DIAGNOSIS — N186 End stage renal disease: Secondary | ICD-10-CM | POA: Diagnosis not present

## 2019-03-23 DIAGNOSIS — Z992 Dependence on renal dialysis: Secondary | ICD-10-CM | POA: Diagnosis not present

## 2019-03-23 DIAGNOSIS — E1129 Type 2 diabetes mellitus with other diabetic kidney complication: Secondary | ICD-10-CM | POA: Diagnosis not present

## 2019-03-23 DIAGNOSIS — N186 End stage renal disease: Secondary | ICD-10-CM | POA: Diagnosis not present

## 2019-04-15 DIAGNOSIS — N186 End stage renal disease: Secondary | ICD-10-CM | POA: Diagnosis not present

## 2019-04-22 DIAGNOSIS — Z992 Dependence on renal dialysis: Secondary | ICD-10-CM | POA: Diagnosis not present

## 2019-04-22 DIAGNOSIS — N186 End stage renal disease: Secondary | ICD-10-CM | POA: Diagnosis not present

## 2019-04-22 DIAGNOSIS — E1129 Type 2 diabetes mellitus with other diabetic kidney complication: Secondary | ICD-10-CM | POA: Diagnosis not present

## 2019-05-02 ENCOUNTER — Other Ambulatory Visit: Payer: Self-pay | Admitting: Family Medicine

## 2019-05-14 DIAGNOSIS — N186 End stage renal disease: Secondary | ICD-10-CM | POA: Diagnosis not present

## 2019-05-23 DIAGNOSIS — E1129 Type 2 diabetes mellitus with other diabetic kidney complication: Secondary | ICD-10-CM | POA: Diagnosis not present

## 2019-05-23 DIAGNOSIS — Z992 Dependence on renal dialysis: Secondary | ICD-10-CM | POA: Diagnosis not present

## 2019-05-23 DIAGNOSIS — N186 End stage renal disease: Secondary | ICD-10-CM | POA: Diagnosis not present

## 2019-06-05 ENCOUNTER — Other Ambulatory Visit: Payer: Self-pay

## 2019-06-05 DIAGNOSIS — E1129 Type 2 diabetes mellitus with other diabetic kidney complication: Secondary | ICD-10-CM

## 2019-06-05 DIAGNOSIS — Z794 Long term (current) use of insulin: Secondary | ICD-10-CM

## 2019-06-05 NOTE — Telephone Encounter (Signed)
Pt calls in requesting rx refills. Pt is scheduled for office visit 06/17/19. Refill requested for synthroid, however, this medication is not currently on medication list.   Please advise  To PCP  Talbot Grumbling, RN

## 2019-06-06 MED ORDER — INSULIN GLARGINE 100 UNIT/ML ~~LOC~~ SOLN: SUBCUTANEOUS | 0 refills | Status: DC

## 2019-06-06 MED ORDER — ROSUVASTATIN CALCIUM 20 MG PO TABS: 40.0000 mg | ORAL_TABLET | Freq: Every day | ORAL | 0 refills | Status: DC

## 2019-06-06 NOTE — Telephone Encounter (Signed)
I had difficult time getting patient into get his labs drawn to titrate his TSH.  The last time I filled his thyroid medication was in October for #30.  I do not know if he has been getting his medication from anyone else or if he has not been taking his thyroid medication since November.  Therefore, if he has not been taking any thyroid medication for several months, it would be important to recheck his levels prior to restarting the medication.  Given his ESRD on dialysis, I worry especially about renally dosing his medications.  He has an office appointment scheduled for 06/17/2019.  Ideally, I would like him to come in prior to for a lab visit as soon as possible.  I have communicated previously that I would not refill his medications without a lab or an office follow-up.  We attempted to reach the patient later in October with no further response.  I will refill patient's Lantus and rosuvastatin once more until patient is seen.  Please ask patient if he has been taking his thyroid medication for the last couple of months, or if he has run out.  Additionally, please perform a thorough medication reconciliation over the phone so that I can check appropriate labs the next time he is seen.  Wilber Oliphant, M.D.  3:59 AM 06/06/2019

## 2019-06-10 NOTE — Telephone Encounter (Signed)
Patient called and informed of need to make a lab appointment prior to office visit next week. Pt states that he thought he could come in next week and have his blood drawn at his scheduled office visit. Informed pt that provider would like to have lab results before appointment. Pt states that he will have to call back to schedule lab visit.   Paul Grumbling, RN

## 2019-06-14 ENCOUNTER — Other Ambulatory Visit: Payer: Medicaid Other

## 2019-06-14 ENCOUNTER — Other Ambulatory Visit: Payer: Self-pay

## 2019-06-14 DIAGNOSIS — N186 End stage renal disease: Secondary | ICD-10-CM | POA: Diagnosis not present

## 2019-06-14 DIAGNOSIS — Z992 Dependence on renal dialysis: Secondary | ICD-10-CM | POA: Diagnosis not present

## 2019-06-15 LAB — RENAL FUNCTION PANEL
Albumin: 5.5 g/dL — ABNORMAL HIGH (ref 4.1–5.2)
BUN/Creatinine Ratio: 3 — ABNORMAL LOW (ref 9–20)
BUN: 31 mg/dL — ABNORMAL HIGH (ref 6–20)
CO2: 28 mmol/L (ref 20–29)
Calcium: 10.5 mg/dL — ABNORMAL HIGH (ref 8.7–10.2)
Chloride: 87 mmol/L — ABNORMAL LOW (ref 96–106)
Creatinine, Ser: 12.06 mg/dL — ABNORMAL HIGH (ref 0.76–1.27)
GFR calc Af Amer: 6 mL/min/{1.73_m2} — ABNORMAL LOW (ref >59)
GFR calc non Af Amer: 5 mL/min/{1.73_m2} — ABNORMAL LOW (ref >59)
Glucose: 138 mg/dL — ABNORMAL HIGH (ref 65–99)
Phosphorus: 4.8 mg/dL — ABNORMAL HIGH (ref 2.8–4.1)
Potassium: 4.4 mmol/L (ref 3.5–5.2)
Sodium: 139 mmol/L (ref 134–144)

## 2019-06-17 ENCOUNTER — Ambulatory Visit: Payer: Medicaid Other | Admitting: Family Medicine

## 2019-06-21 ENCOUNTER — Other Ambulatory Visit: Payer: Self-pay | Admitting: Family Medicine

## 2019-06-21 DIAGNOSIS — E039 Hypothyroidism, unspecified: Secondary | ICD-10-CM

## 2019-06-21 DIAGNOSIS — E1122 Type 2 diabetes mellitus with diabetic chronic kidney disease: Secondary | ICD-10-CM

## 2019-06-21 DIAGNOSIS — Z992 Dependence on renal dialysis: Secondary | ICD-10-CM

## 2019-06-21 NOTE — Progress Notes (Signed)
Patient came in for labs as requested over the last several months. Patient has not wanted to come in due to [place] and I have refilled multiple medicaitons despite patient not being seen in the office.  Previously, I have placed lipid panel, TSH, A1c to be collected on 03/15/19 but it appears those labs were discontinued.  Patient does have an active order for TSH and A1c from September 2020.  At most recent lab visit, patient only obtained renal function panel, which was not the appropriate lab need to be obtained.  Unfortunately, I will likely need this patient come in again to obtain the correct labs that we have been needing; it is probably too late to add these labs onto his most recent lab draw.  Placed new future orders

## 2019-06-22 DIAGNOSIS — N186 End stage renal disease: Secondary | ICD-10-CM | POA: Diagnosis not present

## 2019-06-22 DIAGNOSIS — E1129 Type 2 diabetes mellitus with other diabetic kidney complication: Secondary | ICD-10-CM | POA: Diagnosis not present

## 2019-06-23 DIAGNOSIS — Z992 Dependence on renal dialysis: Secondary | ICD-10-CM | POA: Diagnosis not present

## 2019-06-23 DIAGNOSIS — N186 End stage renal disease: Secondary | ICD-10-CM | POA: Diagnosis not present

## 2019-06-23 DIAGNOSIS — E1129 Type 2 diabetes mellitus with other diabetic kidney complication: Secondary | ICD-10-CM | POA: Diagnosis not present

## 2019-07-01 ENCOUNTER — Telehealth: Payer: Self-pay

## 2019-07-01 NOTE — Telephone Encounter (Signed)
-----   Message from Bobbye Riggs, [place] sent at 07/01/2019  2:24 PM EST ----- RN Team,   Can  you please call this patient and ask him to come in for another lab appointment? There was a communication issue with the last lab visit. Please apologize as I know coming into the lab is very troublesome for him. The labs are already placed for future.   Thank you,   Apolonio Schneiders  ----- Message ----- From: [place] Pink, CMA Sent: 06/24/2019   2:06 PM EST To: Wilber Oliphant, MD Subject: LABS                                           I meant to reply back on this patient. I misread his collection date for when he was here for labs. Unfortunately we are well past the point of adding anything to his previous sample.

## 2019-07-01 NOTE — Telephone Encounter (Signed)
Appt made for 07/05/19 at 9 am. Ottis Stain, CMA

## 2019-07-05 ENCOUNTER — Other Ambulatory Visit: Payer: Medicaid Other

## 2019-07-18 DIAGNOSIS — N186 End stage renal disease: Secondary | ICD-10-CM | POA: Diagnosis not present

## 2019-07-21 DIAGNOSIS — N186 End stage renal disease: Secondary | ICD-10-CM | POA: Diagnosis not present

## 2019-07-21 DIAGNOSIS — Z992 Dependence on renal dialysis: Secondary | ICD-10-CM | POA: Diagnosis not present

## 2019-07-21 DIAGNOSIS — E1129 Type 2 diabetes mellitus with other diabetic kidney complication: Secondary | ICD-10-CM | POA: Diagnosis not present

## 2019-08-08 DIAGNOSIS — N186 End stage renal disease: Secondary | ICD-10-CM | POA: Diagnosis not present

## 2019-08-21 DIAGNOSIS — N186 End stage renal disease: Secondary | ICD-10-CM | POA: Diagnosis not present

## 2019-08-21 DIAGNOSIS — Z992 Dependence on renal dialysis: Secondary | ICD-10-CM | POA: Diagnosis not present

## 2019-08-21 DIAGNOSIS — E1129 Type 2 diabetes mellitus with other diabetic kidney complication: Secondary | ICD-10-CM | POA: Diagnosis not present

## 2019-08-26 DIAGNOSIS — T82868A Thrombosis of vascular prosthetic devices, implants and grafts, initial encounter: Secondary | ICD-10-CM | POA: Diagnosis not present

## 2019-08-26 DIAGNOSIS — I871 Compression of vein: Secondary | ICD-10-CM | POA: Diagnosis not present

## 2019-08-26 DIAGNOSIS — T82858A Stenosis of vascular prosthetic devices, implants and grafts, initial encounter: Secondary | ICD-10-CM | POA: Diagnosis not present

## 2019-08-26 DIAGNOSIS — Z992 Dependence on renal dialysis: Secondary | ICD-10-CM | POA: Diagnosis not present

## 2019-08-26 DIAGNOSIS — N186 End stage renal disease: Secondary | ICD-10-CM | POA: Diagnosis not present

## 2019-08-26 HISTORY — PX: THROMBECTOMY: PRO61

## 2019-09-12 DIAGNOSIS — N186 End stage renal disease: Secondary | ICD-10-CM | POA: Diagnosis not present

## 2019-09-12 DIAGNOSIS — E1129 Type 2 diabetes mellitus with other diabetic kidney complication: Secondary | ICD-10-CM | POA: Diagnosis not present

## 2019-09-18 ENCOUNTER — Other Ambulatory Visit: Payer: Self-pay

## 2019-09-18 ENCOUNTER — Other Ambulatory Visit (INDEPENDENT_AMBULATORY_CARE_PROVIDER_SITE_OTHER): Payer: Medicaid Other

## 2019-09-18 DIAGNOSIS — E039 Hypothyroidism, unspecified: Secondary | ICD-10-CM | POA: Diagnosis not present

## 2019-09-18 DIAGNOSIS — Z992 Dependence on renal dialysis: Secondary | ICD-10-CM | POA: Diagnosis not present

## 2019-09-18 DIAGNOSIS — E1122 Type 2 diabetes mellitus with diabetic chronic kidney disease: Secondary | ICD-10-CM | POA: Diagnosis not present

## 2019-09-18 DIAGNOSIS — N186 End stage renal disease: Secondary | ICD-10-CM | POA: Diagnosis not present

## 2019-09-18 DIAGNOSIS — Z794 Long term (current) use of insulin: Secondary | ICD-10-CM

## 2019-09-18 LAB — POCT GLYCOSYLATED HEMOGLOBIN (HGB A1C): HbA1c, POC (controlled diabetic range): 6.1 % (ref 0.0–7.0)

## 2019-09-19 ENCOUNTER — Other Ambulatory Visit: Payer: Self-pay | Admitting: Family Medicine

## 2019-09-19 ENCOUNTER — Other Ambulatory Visit: Payer: Self-pay | Admitting: Student in an Organized Health Care Education/Training Program

## 2019-09-19 DIAGNOSIS — Z794 Long term (current) use of insulin: Secondary | ICD-10-CM

## 2019-09-19 DIAGNOSIS — E1129 Type 2 diabetes mellitus with other diabetic kidney complication: Secondary | ICD-10-CM

## 2019-09-19 DIAGNOSIS — I10 Essential (primary) hypertension: Secondary | ICD-10-CM

## 2019-09-19 LAB — LIPID PANEL
Chol/HDL Ratio: 8.4 ratio — ABNORMAL HIGH (ref 0.0–5.0)
Cholesterol, Total: 235 mg/dL — ABNORMAL HIGH (ref 100–199)
HDL: 28 mg/dL — ABNORMAL LOW (ref >39)
LDL Chol Calc (NIH): 145 mg/dL — ABNORMAL HIGH (ref 0–99)
Triglycerides: 338 mg/dL — ABNORMAL HIGH (ref 0–149)
VLDL Cholesterol Cal: 62 mg/dL — ABNORMAL HIGH (ref 5–40)

## 2019-09-19 LAB — TSH: TSH: 6.27 u[IU]/mL — ABNORMAL HIGH (ref 0.450–4.500)

## 2019-09-20 ENCOUNTER — Telehealth: Payer: Self-pay | Admitting: Family Medicine

## 2019-09-20 DIAGNOSIS — Z992 Dependence on renal dialysis: Secondary | ICD-10-CM | POA: Diagnosis not present

## 2019-09-20 DIAGNOSIS — E1129 Type 2 diabetes mellitus with other diabetic kidney complication: Secondary | ICD-10-CM | POA: Diagnosis not present

## 2019-09-20 DIAGNOSIS — N186 End stage renal disease: Secondary | ICD-10-CM | POA: Diagnosis not present

## 2019-09-20 MED ORDER — INSULIN GLARGINE 100 UNIT/ML ~~LOC~~ SOLN: SUBCUTANEOUS | 0 refills | Status: DC

## 2019-09-20 MED ORDER — ROSUVASTATIN CALCIUM 20 MG PO TABS: 40.0000 mg | ORAL_TABLET | Freq: Every day | ORAL | 0 refills | Status: DC

## 2019-09-20 NOTE — Telephone Encounter (Signed)
I have received labs for this patient, but he still needs a virtual visit for his medications.

## 2019-09-20 NOTE — Telephone Encounter (Signed)
Contacted pt and informed him of below and scheduled him with video visit next week.  He wanted to see if he could get his meds refilled, Lantus, Crestor and amlodipine before his appointment.  He did say that if you did not want to refill all of them he definitely needs his insulin, he is completely out. Paul [name], CMA

## 2019-09-20 NOTE — Telephone Encounter (Signed)
Called patient as I have continued to receive requests for medications. I have received his most recent labs, but still have no have a virtual visit with him. I have an open appt at 3:50 this afternoon if he is able to do a virtual visit.  Coordinating this patient's care with him has been difficult and drawn out over several months. I would like to make sure he is getting the care he needs, so I am hoping he calls back. Will discuss case with [name].

## 2019-09-24 ENCOUNTER — Other Ambulatory Visit: Payer: Self-pay

## 2019-09-24 ENCOUNTER — Encounter: Payer: Self-pay | Admitting: Family Medicine

## 2019-09-24 ENCOUNTER — Telehealth (INDEPENDENT_AMBULATORY_CARE_PROVIDER_SITE_OTHER): Payer: Medicaid Other | Admitting: Family Medicine

## 2019-09-24 VITALS — Ht 73.0 in | Wt 326.0 lb

## 2019-09-24 DIAGNOSIS — Z992 Dependence on renal dialysis: Secondary | ICD-10-CM | POA: Diagnosis not present

## 2019-09-24 DIAGNOSIS — N186 End stage renal disease: Secondary | ICD-10-CM

## 2019-09-24 DIAGNOSIS — I10 Essential (primary) hypertension: Secondary | ICD-10-CM | POA: Diagnosis not present

## 2019-09-24 DIAGNOSIS — E039 Hypothyroidism, unspecified: Secondary | ICD-10-CM | POA: Diagnosis not present

## 2019-09-24 DIAGNOSIS — E785 Hyperlipidemia, unspecified: Secondary | ICD-10-CM | POA: Diagnosis not present

## 2019-09-24 DIAGNOSIS — Z794 Long term (current) use of insulin: Secondary | ICD-10-CM

## 2019-09-24 DIAGNOSIS — E1129 Type 2 diabetes mellitus with other diabetic kidney complication: Secondary | ICD-10-CM | POA: Diagnosis not present

## 2019-09-24 DIAGNOSIS — E1122 Type 2 diabetes mellitus with diabetic chronic kidney disease: Secondary | ICD-10-CM

## 2019-09-24 NOTE — Progress Notes (Signed)
Spoke with pt went over check in questions. Salvatore Marvel, CMA

## 2019-09-24 NOTE — Progress Notes (Signed)
[facility] Telemedicine Visit  Patient consented to have virtual visit and was identified by name and date of birth. Method of visit: Video was attempted, but technology challenges prevented patient from using video, so visit was conducted via telephone.  Encounter participants: Patient: Paul [name] - located at home  Provider: Wilber Oliphant - located at [facility] Others (if applicable):   Follow up for Chronic Disease  Type II Diabetes A1C 6.1  Currently, taking 10-15 lantus units twice a day depending on what he's eating, mostly lunch and dinner. Tries to check his sugars at home, usually, though does report not check them often. Patient reports usually around 150-180. Patient reports some episodes of hypoglycemia, but hey do not happen often. He denies any blurred vision, n/v, changes in mental status.   Hypertension  Patient is currently prescribed 10 mg amlodipine once daily. Patient reports that he is taking maybe about 3-4 x a week. Sometimes taking it the night or day before he goes to dialysis (T, Th, F @ 6-11am).  Patient reports that he takes medication if he is feeling hypertensive.  He notes this usually happens when he has a lot of volume on him.  He does report that some days during dialysis, his BP has been dropping.  Typically, patient reports home blood pressures 790-240 systolic over 97-353 diastolic.  Hypothyroidism  Most recent 6.270- takes euthyrox 175 mcg. Last refill 03/01/19.  Patient reports that he has not been taking this medication as he has not had any of the medication at home.  He also reports when he had the medication, he would oftentimes forget to take it prior to eating and is unsure if he was taking it at the right time.  Patient reports that he is overall doing well.  No significant fatigue, sleepiness, weight gain, polyuria, polydipsia.   HLD Most recent lipid panel with mixed hyperlipidemia. Currently Rx'ed Crestor 20 mg.   ROS: per  HPI  Exam:  Ht 6\' 1"  (1.854 m)   Wt (!) 326 lb (147.9 kg)   BMI 43.01 kg/m   Respiratory: No acute distress.   Assessment/Plan:  Hypothyroidism Given no intake of thyroid medication in several months, TSH elevated. Will start lower dose Euthyrox and scheduled f/u lab appt for recheck. 10/25/19 @ 0900. Medication sent to pharmacy. Return precautions provided.   Essential hypertension, benign Patient reports nonadherence to amlodipine daily. Taking medication on days that he feels as if blood pressure is high.  He does also report low blood pressures during dialysis.  I have asked patient to take his blood pressures daily. It is possible that patient can come off of medication with better volume control. Will continue to monitor. Directions to take 5 mg amlodipine on non-dialysis days for now.   Hyperlipidemia We will continue Crestor 20 mg for now.  Patient also counseled on diet.  Recently read studies in passing about statins in patients with ESRD on dialysis.  We will read further about benefits of this medication in this population.  Type 2 diabetes mellitus ([place]) Patient's most recent A1c 6.1.  It is likely that his A1c is falsely low due to ESRD.  Patient reports that he is taking about 10 to 15 units of Lantus twice daily depending on what he is eating.  Not many episodes of hypoglycemia.  Would like patient to check his sugars once daily and record.  Unclear why he is doing twice daily Lantus dosing.  Patient to continue with  current dosing with plans on simplifying dosing.     Time spent during visit with patient: 32 minutes  Wilber Oliphant, M.D.  1:44 PM 09/27/2019

## 2019-09-26 ENCOUNTER — Other Ambulatory Visit: Payer: Self-pay | Admitting: Family Medicine

## 2019-09-26 ENCOUNTER — Encounter: Payer: Self-pay | Admitting: Family Medicine

## 2019-09-26 DIAGNOSIS — Z794 Long term (current) use of insulin: Secondary | ICD-10-CM

## 2019-09-26 DIAGNOSIS — E039 Hypothyroidism, unspecified: Secondary | ICD-10-CM

## 2019-09-26 DIAGNOSIS — E1129 Type 2 diabetes mellitus with other diabetic kidney complication: Secondary | ICD-10-CM

## 2019-09-26 MED ORDER — LEVOTHYROXINE SODIUM 75 MCG PO TABS: 75.0000 ug | ORAL_TABLET | Freq: Every day | ORAL | 0 refills | Status: DC

## 2019-09-26 MED ORDER — ACCU-CHEK SOFT TOUCH LANCETS MISC: 1.0000 | Freq: Every day | 12 refills | Status: DC

## 2019-09-26 MED ORDER — INSULIN GLARGINE 100 UNIT/ML ~~LOC~~ SOLN: SUBCUTANEOUS | 0 refills | Status: DC

## 2019-09-26 MED ORDER — ROSUVASTATIN CALCIUM 20 MG PO TABS: 40.0000 mg | ORAL_TABLET | Freq: Every day | ORAL | 0 refills | Status: DC

## 2019-09-26 NOTE — Assessment & Plan Note (Signed)
Patient reports nonadherence to amlodipine daily. Taking medication on days that he feels as if blood pressure is high.  He does also report low blood pressures during dialysis.  I have asked patient to take his blood pressures daily. It is possible that patient can come off of medication with better volume control. Will continue to monitor. Directions to take 5 mg amlodipine on non-dialysis days for now.

## 2019-09-26 NOTE — Assessment & Plan Note (Signed)
Given no intake of thyroid medication in several months, TSH elevated. Will start lower dose Euthyrox and scheduled f/u lab appt for recheck. 10/25/19 @ 0900. Medication sent to pharmacy. Return precautions provided.

## 2019-09-26 NOTE — Assessment & Plan Note (Signed)
We will continue Crestor 20 mg for now.  Patient also counseled on diet.  Recently read studies in passing about statins in patients with ESRD on dialysis.  We will read further about benefits of this medication in this population.

## 2019-09-27 NOTE — Assessment & Plan Note (Signed)
Patient's most recent A1c 6.1.  It is likely that his A1c is falsely low due to ESRD.  Patient reports that he is taking about 10 to 15 units of Lantus twice daily depending on what he is eating.  Not many episodes of hypoglycemia.  Would like patient to check his sugars once daily and record.  Unclear why he is doing twice daily Lantus dosing.  Patient to continue with current dosing with plans on simplifying dosing.

## 2019-10-02 ENCOUNTER — Other Ambulatory Visit: Payer: Self-pay | Admitting: Family Medicine

## 2019-10-10 ENCOUNTER — Telehealth: Payer: Self-pay

## 2019-10-10 DIAGNOSIS — N186 End stage renal disease: Secondary | ICD-10-CM | POA: Diagnosis not present

## 2019-10-10 MED ORDER — ROSUVASTATIN CALCIUM 20 MG PO TABS: 40.0000 mg | ORAL_TABLET | Freq: Every day | ORAL | 0 refills | Status: DC

## 2019-10-10 NOTE — Telephone Encounter (Signed)
Patient calls nurse line stating he has been trying to get his crestor since the beginning of May. Per chart review looks like Crestor was sent in on 5/6, however it was set to print and patient was never given printed medication, as he did a video call that day. Will resend in Crestor to his pharmacy.

## 2019-10-21 DIAGNOSIS — E1129 Type 2 diabetes mellitus with other diabetic kidney complication: Secondary | ICD-10-CM | POA: Diagnosis not present

## 2019-10-21 DIAGNOSIS — Z992 Dependence on renal dialysis: Secondary | ICD-10-CM | POA: Diagnosis not present

## 2019-10-21 DIAGNOSIS — N186 End stage renal disease: Secondary | ICD-10-CM | POA: Diagnosis not present

## 2019-10-23 DIAGNOSIS — T82868A Thrombosis of vascular prosthetic devices, implants and grafts, initial encounter: Secondary | ICD-10-CM | POA: Diagnosis not present

## 2019-10-23 DIAGNOSIS — Z992 Dependence on renal dialysis: Secondary | ICD-10-CM | POA: Diagnosis not present

## 2019-10-23 DIAGNOSIS — T82858A Stenosis of vascular prosthetic devices, implants and grafts, initial encounter: Secondary | ICD-10-CM | POA: Diagnosis not present

## 2019-10-23 DIAGNOSIS — N186 End stage renal disease: Secondary | ICD-10-CM | POA: Diagnosis not present

## 2019-11-07 DIAGNOSIS — N186 End stage renal disease: Secondary | ICD-10-CM | POA: Diagnosis not present

## 2019-11-10 ENCOUNTER — Other Ambulatory Visit: Payer: Self-pay | Admitting: Family Medicine

## 2019-11-10 DIAGNOSIS — E039 Hypothyroidism, unspecified: Secondary | ICD-10-CM

## 2019-11-20 DIAGNOSIS — Z992 Dependence on renal dialysis: Secondary | ICD-10-CM | POA: Diagnosis not present

## 2019-11-20 DIAGNOSIS — E1129 Type 2 diabetes mellitus with other diabetic kidney complication: Secondary | ICD-10-CM | POA: Diagnosis not present

## 2019-11-20 DIAGNOSIS — N186 End stage renal disease: Secondary | ICD-10-CM | POA: Diagnosis not present

## 2019-12-21 DIAGNOSIS — N186 End stage renal disease: Secondary | ICD-10-CM | POA: Diagnosis not present

## 2019-12-21 DIAGNOSIS — E1129 Type 2 diabetes mellitus with other diabetic kidney complication: Secondary | ICD-10-CM | POA: Diagnosis not present

## 2019-12-21 DIAGNOSIS — Z992 Dependence on renal dialysis: Secondary | ICD-10-CM | POA: Diagnosis not present

## 2019-12-24 ENCOUNTER — Other Ambulatory Visit: Payer: Self-pay | Admitting: Family Medicine

## 2019-12-24 DIAGNOSIS — E039 Hypothyroidism, unspecified: Secondary | ICD-10-CM

## 2020-01-08 DIAGNOSIS — I120 Hypertensive chronic kidney disease with stage 5 chronic kidney disease or end stage renal disease: Secondary | ICD-10-CM | POA: Diagnosis not present

## 2020-01-08 DIAGNOSIS — I10 Essential (primary) hypertension: Secondary | ICD-10-CM | POA: Insufficient documentation

## 2020-01-08 DIAGNOSIS — Z888 Allergy status to other drugs, medicaments and biological substances status: Secondary | ICD-10-CM | POA: Diagnosis not present

## 2020-01-08 DIAGNOSIS — Z01818 Encounter for other preprocedural examination: Secondary | ICD-10-CM | POA: Diagnosis not present

## 2020-01-08 DIAGNOSIS — N186 End stage renal disease: Secondary | ICD-10-CM | POA: Diagnosis not present

## 2020-01-08 DIAGNOSIS — N261 Atrophy of kidney (terminal): Secondary | ICD-10-CM | POA: Diagnosis not present

## 2020-02-05 ENCOUNTER — Other Ambulatory Visit: Payer: Self-pay | Admitting: Family Medicine

## 2020-02-05 DIAGNOSIS — E1129 Type 2 diabetes mellitus with other diabetic kidney complication: Secondary | ICD-10-CM

## 2020-02-05 DIAGNOSIS — E039 Hypothyroidism, unspecified: Secondary | ICD-10-CM

## 2020-02-10 ENCOUNTER — Other Ambulatory Visit: Payer: Self-pay | Admitting: Family Medicine

## 2020-02-20 DIAGNOSIS — Z992 Dependence on renal dialysis: Secondary | ICD-10-CM | POA: Diagnosis not present

## 2020-02-20 DIAGNOSIS — N186 End stage renal disease: Secondary | ICD-10-CM | POA: Diagnosis not present

## 2020-02-20 DIAGNOSIS — E1129 Type 2 diabetes mellitus with other diabetic kidney complication: Secondary | ICD-10-CM | POA: Diagnosis not present

## 2020-03-03 ENCOUNTER — Other Ambulatory Visit: Payer: Self-pay | Admitting: Family Medicine

## 2020-03-03 DIAGNOSIS — I10 Essential (primary) hypertension: Secondary | ICD-10-CM

## 2020-03-06 ENCOUNTER — Telehealth: Payer: Self-pay

## 2020-03-06 DIAGNOSIS — Z01818 Encounter for other preprocedural examination: Secondary | ICD-10-CM | POA: Diagnosis not present

## 2020-03-06 DIAGNOSIS — I10 Essential (primary) hypertension: Secondary | ICD-10-CM

## 2020-03-06 DIAGNOSIS — I6523 Occlusion and stenosis of bilateral carotid arteries: Secondary | ICD-10-CM | POA: Diagnosis not present

## 2020-03-06 DIAGNOSIS — Z992 Dependence on renal dialysis: Secondary | ICD-10-CM | POA: Diagnosis not present

## 2020-03-06 DIAGNOSIS — Z01810 Encounter for preprocedural cardiovascular examination: Secondary | ICD-10-CM | POA: Diagnosis not present

## 2020-03-06 DIAGNOSIS — N186 End stage renal disease: Secondary | ICD-10-CM | POA: Diagnosis not present

## 2020-03-06 NOTE — Telephone Encounter (Signed)
[facility] calls nurse line requesting clarification on 10/12 Amlodipine directions. The sig currently states take one tab PO daily, however also says take one tab PO on nondialysis days. Please clarify if he is to take one everyday or only on nondialysis days.

## 2020-03-07 MED ORDER — AMLODIPINE BESYLATE 5 MG PO TABS: ORAL_TABLET | ORAL | 2 refills | Status: DC

## 2020-03-07 NOTE — Telephone Encounter (Signed)
Rx resent and clarified with free text.

## 2020-03-09 DIAGNOSIS — N186 End stage renal disease: Secondary | ICD-10-CM | POA: Diagnosis not present

## 2020-03-09 DIAGNOSIS — I871 Compression of vein: Secondary | ICD-10-CM | POA: Diagnosis not present

## 2020-03-09 DIAGNOSIS — T82868A Thrombosis of vascular prosthetic devices, implants and grafts, initial encounter: Secondary | ICD-10-CM | POA: Diagnosis not present

## 2020-03-09 DIAGNOSIS — Z992 Dependence on renal dialysis: Secondary | ICD-10-CM | POA: Diagnosis not present

## 2020-03-17 ENCOUNTER — Other Ambulatory Visit: Payer: Self-pay | Admitting: Family Medicine

## 2020-03-17 DIAGNOSIS — E039 Hypothyroidism, unspecified: Secondary | ICD-10-CM

## 2020-03-17 DIAGNOSIS — E1129 Type 2 diabetes mellitus with other diabetic kidney complication: Secondary | ICD-10-CM

## 2020-03-17 DIAGNOSIS — Z794 Long term (current) use of insulin: Secondary | ICD-10-CM

## 2020-03-22 DIAGNOSIS — E1129 Type 2 diabetes mellitus with other diabetic kidney complication: Secondary | ICD-10-CM | POA: Diagnosis not present

## 2020-03-22 DIAGNOSIS — Z992 Dependence on renal dialysis: Secondary | ICD-10-CM | POA: Diagnosis not present

## 2020-03-22 DIAGNOSIS — N186 End stage renal disease: Secondary | ICD-10-CM | POA: Diagnosis not present

## 2020-03-23 DIAGNOSIS — T829XXA Unspecified complication of cardiac and vascular prosthetic device, implant and graft, initial encounter: Secondary | ICD-10-CM | POA: Insufficient documentation

## 2020-04-07 ENCOUNTER — Other Ambulatory Visit: Payer: Self-pay | Admitting: *Deleted

## 2020-04-07 DIAGNOSIS — N186 End stage renal disease: Secondary | ICD-10-CM

## 2020-04-07 DIAGNOSIS — Z992 Dependence on renal dialysis: Secondary | ICD-10-CM

## 2020-04-20 ENCOUNTER — Ambulatory Visit (HOSPITAL_COMMUNITY)
Admission: RE | Admit: 2020-04-20 | Discharge: 2020-04-20 | Disposition: A | Discharge status: Home / Self Care | Payer: Medicaid Other | Source: Ambulatory Visit | Attending: Surgery | Admitting: Surgery

## 2020-04-20 ENCOUNTER — Encounter: Payer: Self-pay | Admitting: Surgery

## 2020-04-20 ENCOUNTER — Other Ambulatory Visit: Payer: Self-pay

## 2020-04-20 ENCOUNTER — Other Ambulatory Visit: Payer: Self-pay | Admitting: Family Medicine

## 2020-04-20 ENCOUNTER — Ambulatory Visit (INDEPENDENT_AMBULATORY_CARE_PROVIDER_SITE_OTHER): Payer: Medicaid Other | Admitting: Surgery

## 2020-04-20 ENCOUNTER — Ambulatory Visit (INDEPENDENT_AMBULATORY_CARE_PROVIDER_SITE_OTHER)
Admission: RE | Admit: 2020-04-20 | Discharge: 2020-04-20 | Disposition: A | Discharge status: Home / Self Care | Payer: Medicaid Other | Source: Ambulatory Visit | Attending: Surgery | Admitting: Surgery

## 2020-04-20 VITALS — BP 119/82 | HR 81 | Temp 97.3°F | Resp 20 | Ht 73.0 in | Wt 317.0 lb

## 2020-04-20 DIAGNOSIS — Z992 Dependence on renal dialysis: Secondary | ICD-10-CM | POA: Insufficient documentation

## 2020-04-20 DIAGNOSIS — N186 End stage renal disease: Secondary | ICD-10-CM | POA: Insufficient documentation

## 2020-04-20 DIAGNOSIS — E1129 Type 2 diabetes mellitus with other diabetic kidney complication: Secondary | ICD-10-CM

## 2020-04-20 NOTE — H&P (View-Only) (Signed)
Vascular and Vein Specialist of [facility]  Patient name: Paul [name] MRN: [id number] DOB: Mar 24, 1991 Sex: male   REASON FOR VISIT:    Follow up  HISOTRY OF PRESENT ILLNESS:    Paul [name] is a 30 y.o. male who underwent left brachiocephalic fistula on 01/26/931 by Dr. Oneida Alar.  He then had a left upper arm loop graft by Dr. Donnetta Hutching on 12/02/2016.  In 2019 he presented with active bleeding from his graft which required revision by Dr. Carlis Abbott.  He no longer has a thrill in his graft and is here today for new access.  The patient's renal failure secondary to diabetes and hypertension.  He also suffers from morbid obesity.  He is a non-smoker.  He takes a statin for hypercholesterolemia.   PAST MEDICAL HISTORY:   Past Medical History:  Diagnosis Date  . Acute hypoxemic respiratory failure ([place]) 09/11/2016  . Anemia   . Chronic kidney disease    ARF on CRF Dialysis T/TH/Sa  . Diabetes mellitus    Type II  . GERD (gastroesophageal reflux disease)   . Hypertension   . Morbid obesity ([place])   . Sacral wound   . Thyroid disease      FAMILY HISTORY:   Family History  Problem Relation Age of Onset  . Heart disease Mother   . Hypertension Mother     SOCIAL HISTORY:   Social History   Tobacco Use  . Smoking status: Never Smoker  . Smokeless tobacco: Never Used  Substance Use Topics  . Alcohol use: No     ALLERGIES:   Allergies  Allergen Reactions  . Hydralazine Hcl Other (See Comments)    DRUG-INDUCED LUPUS     CURRENT MEDICATIONS:   Current Outpatient Medications  Medication Sig Dispense Refill  . amLODipine (NORVASC) 5 MG tablet Take 5 mg on nondialysis days. 90 tablet 2  . Blood Glucose Monitoring Suppl (ACCU-CHEK GUIDE ME) w/Device KIT 1 kit by Does not apply route daily. 1 kit 0  . ferric citrate (AURYXIA) 1 GM 210 MG(Fe) tablet Take 840 mg by mouth 3 (three) times daily with meals.    Marland Kitchen glucose blood (ACCU-CHEK GUIDE)  test strip 1 each by Other route 2 (two) times daily. Use as instructed 100 each 12  . insulin glargine (LANTUS) 100 UNIT/ML injection INJECT 0.1-0.15 MLS (10-15 UNITS) INTO THE SKIN 2 TIMES DAILY. PATIENT NEEDS APPOINTMENT 10 mL 0  . Lancets (ACCU-CHEK SOFT TOUCH) lancets 1 each by Other route daily. Use as instructed 100 each 12  . levothyroxine (SYNTHROID) 75 MCG tablet TAKE 1 TABLET BY MOUTH ONCE DAILY BEFORE BREAKFAST 45 tablet 0  . multivitamin (RENA-VIT) TABS tablet Take 1 tablet by mouth at bedtime. 30 tablet 0  . rosuvastatin (CRESTOR) 20 MG tablet Take 2 tablets by mouth once daily 90 tablet 0  . Syringe, Disposable, (B-D SYRINGE SLIP TIP 30CC) 30 ML MISC 1 Syringe by Does not apply route daily. 30 each 11   No current facility-administered medications for this visit.    REVIEW OF SYSTEMS:   _0  denotes positive finding, _1  denotes negative finding Cardiac  Comments:  Chest pain or chest pressure:    Shortness of breath upon exertion:    Short of breath when lying flat:    Irregular heart rhythm:        Vascular    Pain in calf, thigh, or hip brought on by ambulation:    Pain in feet at night that wakes you up  from your sleep:     Blood clot in your veins:    Leg swelling:         Pulmonary    Oxygen at home:    Productive cough:     Wheezing:         Neurologic    Sudden weakness in arms or legs:     Sudden numbness in arms or legs:     Sudden onset of difficulty speaking or slurred speech:    Temporary loss of vision in one eye:     Problems with dizziness:         Gastrointestinal    Blood in stool:     Vomited blood:         Genitourinary    Burning when urinating:     Blood in urine:        Psychiatric    Major depression:         Hematologic    Bleeding problems:    Problems with blood clotting too easily:        Skin    Rashes or ulcers:        Constitutional    Fever or chills:      PHYSICAL EXAM:   Vitals:   04/20/20 1200  BP: 119/82   Pulse: 81  Resp: 20  Temp: (!) 97.3 F (36.3 C)  SpO2: 99%  Weight: (!) 317 lb (143.8 kg)  Height: _0  (1.854 m)    GENERAL: The patient is a well-nourished male, in no acute distress. The vital signs are documented above. CARDIAC: There is a regular rate and rhythm.  VASCULAR: Palpable radial pulse.  Occluded left upper arm loop graft PULMONARY: Non-labored respirations ABDOMEN: Soft and non-tender with normal pitched bowel sounds.  MUSCULOSKELETAL: There are no major deformities or cyanosis. NEUROLOGIC: No focal weakness or paresthesias are detected. SKIN: There are no ulcers or rashes noted. PSYCHIATRIC: The patient has a normal affect.  STUDIES:   I have reviewed the following studies:  Arterial: Triphasic signals bilateral upper extremity +-----------------+-------------+----------+--------+  Right Cephalic  Diameter (cm)Depth (cm)Findings  +-----------------+-------------+----------+--------+  Shoulder       0.24              +-----------------+-------------+----------+--------+  Prox upper arm    0.21              +-----------------+-------------+----------+--------+  Mid upper arm    0.23              +-----------------+-------------+----------+--------+  Dist upper arm    0.29              +-----------------+-------------+----------+--------+  Antecubital fossa  0.29              +-----------------+-------------+----------+--------+  Prox forearm     0.23              +-----------------+-------------+----------+--------+  Mid forearm     0.22              +-----------------+-------------+----------+--------+  Dist forearm     0.19              +-----------------+-------------+----------+--------+   +-----------------+-------------+----------+--------+  Right Basilic  Diameter (cm)Depth  (cm)Findings  +-----------------+-------------+----------+--------+  Mid upper arm    0.40              +-----------------+-------------+----------+--------+  Dist upper arm    0.47              +-----------------+-------------+----------+--------+  Antecubital fossa  0.31              +-----------------+-------------+----------+--------+  MEDICAL ISSUES:   End-stage renal disease: I discussed with the patient that he had a adequate basilic vein on the right.  We discussed a staged right basilic vein fistula creation.  I discussed the risks and benefits of the procedure including the risk of not maturity, the need for second operation, and the risk of steal syndrome.  All of his questions were answered.    Leia Alf, MD, FACS Vascular and Vein Specialists of [facility] [phone] Pager [phone]

## 2020-04-20 NOTE — Progress Notes (Signed)
 Vascular and Vein Specialist of [place]  Patient name: Paul [name] MRN: [id number] DOB: 04/04/1991 Sex: male   REASON FOR VISIT:    Follow up  HISOTRY OF PRESENT ILLNESS:    Paul [name] is a 29 y.o. male who underwent left brachiocephalic fistula on 09/26/2016 by Dr. Fields.  He then had a left upper arm loop graft by Dr. Early on 12/02/2016.  In 2019 he presented with active bleeding from his graft which required revision by Dr. Clark.  He no longer has a thrill in his graft and is here today for new access.  The patient's renal failure secondary to diabetes and hypertension.  He also suffers from morbid obesity.  He is a non-smoker.  He takes a statin for hypercholesterolemia.   PAST MEDICAL HISTORY:   Past Medical History:  Diagnosis Date  . Acute hypoxemic respiratory failure ([facility]) 09/11/2016  . Anemia   . Chronic kidney disease    ARF on CRF Dialysis T/TH/Sa  . Diabetes mellitus    Type II  . GERD (gastroesophageal reflux disease)   . Hypertension   . Morbid obesity ([facility])   . Sacral wound   . Thyroid disease      FAMILY HISTORY:   Family History  Problem Relation Age of Onset  . Heart disease Mother   . Hypertension Mother     SOCIAL HISTORY:   Social History   Tobacco Use  . Smoking status: Never Smoker  . Smokeless tobacco: Never Used  Substance Use Topics  . Alcohol use: No     ALLERGIES:   Allergies  Allergen Reactions  . Hydralazine Hcl Other (See Comments)    DRUG-INDUCED LUPUS     CURRENT MEDICATIONS:   Current Outpatient Medications  Medication Sig Dispense Refill  . amLODipine (NORVASC) 5 MG tablet Take 5 mg on nondialysis days. 90 tablet 2  . Blood Glucose Monitoring Suppl (ACCU-CHEK GUIDE ME) w/Device KIT 1 kit by Does not apply route daily. 1 kit 0  . ferric citrate (AURYXIA) 1 GM 210 MG(Fe) tablet Take 840 mg by mouth 3 (three) times daily with meals.    . glucose blood (ACCU-CHEK GUIDE)  test strip 1 each by Other route 2 (two) times daily. Use as instructed 100 each 12  . insulin glargine (LANTUS) 100 UNIT/ML injection INJECT 0.1-0.15 MLS (10-15 UNITS) INTO THE SKIN 2 TIMES DAILY. PATIENT NEEDS APPOINTMENT 10 mL 0  . Lancets (ACCU-CHEK SOFT TOUCH) lancets 1 each by Other route daily. Use as instructed 100 each 12  . levothyroxine (SYNTHROID) 75 MCG tablet TAKE 1 TABLET BY MOUTH ONCE DAILY BEFORE BREAKFAST 45 tablet 0  . multivitamin (RENA-VIT) TABS tablet Take 1 tablet by mouth at bedtime. 30 tablet 0  . rosuvastatin (CRESTOR) 20 MG tablet Take 2 tablets by mouth once daily 90 tablet 0  . Syringe, Disposable, (B-D SYRINGE SLIP TIP 30CC) 30 ML MISC 1 Syringe by Does not apply route daily. 30 each 11   No current facility-administered medications for this visit.    REVIEW OF SYSTEMS:   [X] denotes positive finding, [ ] denotes negative finding Cardiac  Comments:  Chest pain or chest pressure:    Shortness of breath upon exertion:    Short of breath when lying flat:    Irregular heart rhythm:        Vascular    Pain in calf, thigh, or hip brought on by ambulation:    Pain in feet at night that wakes you up   from your sleep:     Blood clot in your veins:    Leg swelling:         Pulmonary    Oxygen at home:    Productive cough:     Wheezing:         Neurologic    Sudden weakness in arms or legs:     Sudden numbness in arms or legs:     Sudden onset of difficulty speaking or slurred speech:    Temporary loss of vision in one eye:     Problems with dizziness:         Gastrointestinal    Blood in stool:     Vomited blood:         Genitourinary    Burning when urinating:     Blood in urine:        Psychiatric    Major depression:         Hematologic    Bleeding problems:    Problems with blood clotting too easily:        Skin    Rashes or ulcers:        Constitutional    Fever or chills:      PHYSICAL EXAM:   Vitals:   04/20/20 1200  BP: 119/82   Pulse: 81  Resp: 20  Temp: (!) 97.3 F (36.3 C)  SpO2: 99%  Weight: (!) 317 lb (143.8 kg)  Height: 6' 1" (1.854 m)    GENERAL: The patient is a well-nourished male, in no acute distress. The vital signs are documented above. CARDIAC: There is a regular rate and rhythm.  VASCULAR: Palpable radial pulse.  Occluded left upper arm loop graft PULMONARY: Non-labored respirations ABDOMEN: Soft and non-tender with normal pitched bowel sounds.  MUSCULOSKELETAL: There are no major deformities or cyanosis. NEUROLOGIC: No focal weakness or paresthesias are detected. SKIN: There are no ulcers or rashes noted. PSYCHIATRIC: The patient has a normal affect.  STUDIES:   I have reviewed the following studies:  Arterial: Triphasic signals bilateral upper extremity +-----------------+-------------+----------+--------+  Right Cephalic  Diameter (cm)Depth (cm)Findings  +-----------------+-------------+----------+--------+  Shoulder       0.24              +-----------------+-------------+----------+--------+  Prox upper arm    0.21              +-----------------+-------------+----------+--------+  Mid upper arm    0.23              +-----------------+-------------+----------+--------+  Dist upper arm    0.29              +-----------------+-------------+----------+--------+  Antecubital fossa  0.29              +-----------------+-------------+----------+--------+  Prox forearm     0.23              +-----------------+-------------+----------+--------+  Mid forearm     0.22              +-----------------+-------------+----------+--------+  Dist forearm     0.19              +-----------------+-------------+----------+--------+   +-----------------+-------------+----------+--------+  Right Basilic  Diameter (cm)Depth  (cm)Findings  +-----------------+-------------+----------+--------+  Mid upper arm    0.40              +-----------------+-------------+----------+--------+  Dist upper arm    0.47              +-----------------+-------------+----------+--------+  Antecubital fossa  0.31              +-----------------+-------------+----------+--------+     MEDICAL ISSUES:   End-stage renal disease: I discussed with the patient that he had a adequate basilic vein on the right.  We discussed a staged right basilic vein fistula creation.  I discussed the risks and benefits of the procedure including the risk of not maturity, the need for second operation, and the risk of steal syndrome.  All of his questions were answered.    Wells Shakyia Bosso, IV, MD, FACS Vascular and Vein Specialists of [place] Tel [phone] Pager [phone] 

## 2020-04-21 ENCOUNTER — Other Ambulatory Visit: Payer: Self-pay | Admitting: *Deleted

## 2020-04-21 DIAGNOSIS — E039 Hypothyroidism, unspecified: Secondary | ICD-10-CM

## 2020-04-21 DIAGNOSIS — Z992 Dependence on renal dialysis: Secondary | ICD-10-CM | POA: Diagnosis not present

## 2020-04-21 DIAGNOSIS — E1129 Type 2 diabetes mellitus with other diabetic kidney complication: Secondary | ICD-10-CM | POA: Diagnosis not present

## 2020-04-21 DIAGNOSIS — N186 End stage renal disease: Secondary | ICD-10-CM | POA: Diagnosis not present

## 2020-04-21 MED ORDER — LEVOTHYROXINE SODIUM 75 MCG PO TABS: 75.0000 ug | ORAL_TABLET | Freq: Every day | ORAL | 0 refills | Status: DC

## 2020-04-21 NOTE — Telephone Encounter (Signed)
Called patient and scheduled appointment.

## 2020-05-04 ENCOUNTER — Other Ambulatory Visit (HOSPITAL_COMMUNITY)
Admission: RE | Admit: 2020-05-04 | Discharge: 2020-05-04 | Disposition: A | Discharge status: Home / Self Care | Payer: Medicaid Other | Source: Ambulatory Visit | Attending: Surgery | Admitting: Surgery

## 2020-05-04 DIAGNOSIS — Z20822 Contact with and (suspected) exposure to covid-19: Secondary | ICD-10-CM | POA: Diagnosis not present

## 2020-05-04 DIAGNOSIS — Z01812 Encounter for preprocedural laboratory examination: Secondary | ICD-10-CM | POA: Insufficient documentation

## 2020-05-05 ENCOUNTER — Encounter (HOSPITAL_COMMUNITY): Payer: Self-pay | Admitting: Surgery

## 2020-05-05 LAB — SARS CORONAVIRUS 2 (TAT 6-24 HRS): SARS Coronavirus 2: NEGATIVE

## 2020-05-05 MED ORDER — DEXTROSE 5 % IV SOLN
3.0000 g | INTRAVENOUS | Status: DC
  Filled 2020-05-05: qty 3000

## 2020-05-05 NOTE — Progress Notes (Signed)
Patient denies chest pain, shortness of breath, or cardiology visit. States compliance with quarantine since COVID test. Educated on visitation policy. Below instructions provided regarding DM.    How do I manage my blood sugar before surgery? . Check your blood sugar at least 4 times a day, starting 2 days before surgery, to make sure that the level is not too high or low. o Check your blood sugar the morning of your surgery when you wake up and every 2 hours until you get to the Short Stay unit. . If your blood sugar is less than 70 mg/dL, you will need to treat for low blood sugar: o Do not take insulin. o Treat a low blood sugar (less than 70 mg/dL) with  cup of clear juice (cranberry or apple), 4 glucose tablets, OR glucose gel. Recheck blood sugar in 15 minutes after treatment (to make sure it is greater than 70 mg/dL). If your blood sugar is not greater than 70 mg/dL on recheck, call [phone] o  for further instructions. . Report your blood sugar to the short stay nurse when you get to Short Stay.  . If you are admitted to the hospital after surgery: o Your blood sugar will be checked by the staff and you will probably be given insulin after surgery (instead of oral diabetes medicines) to make sure you have good blood sugar levels. o The goal for blood sugar control after surgery is 80-180 mg/dL.  WHAT DO I DO ABOUT MY DIABETES MEDICATION?   Marland Kitchen Do not take oral diabetes medicines (pills) the morning of surgery.  . THE NIGHT BEFORE SURGERY, take ______10_____ units of _Lantus__________insulin.       . THE MORNING OF SURGERY, take ____10_________ units of _Lantus_________insulin.  . The day of surgery, do not take other diabetes injectables, including Byetta (exenatide), Bydureon (exenatide ER), Victoza (liraglutide), or Trulicity (dulaglutide).

## 2020-05-06 ENCOUNTER — Ambulatory Visit (HOSPITAL_COMMUNITY)
Admission: RE | Admit: 2020-05-06 | Discharge: 2020-05-06 | Disposition: A | Discharge status: Home / Self Care | Payer: Medicaid Other | Attending: Surgery | Admitting: Surgery

## 2020-05-06 ENCOUNTER — Ambulatory Visit (HOSPITAL_COMMUNITY): Payer: Medicaid Other | Admitting: Anesthesiology

## 2020-05-06 ENCOUNTER — Encounter (HOSPITAL_COMMUNITY)
Admission: RE | Disposition: A | Discharge status: Home / Self Care | Payer: Self-pay | Source: Home / Self Care | Attending: Surgery

## 2020-05-06 ENCOUNTER — Encounter (HOSPITAL_COMMUNITY): Payer: Self-pay | Admitting: Surgery

## 2020-05-06 ENCOUNTER — Other Ambulatory Visit: Payer: Self-pay

## 2020-05-06 DIAGNOSIS — Z794 Long term (current) use of insulin: Secondary | ICD-10-CM | POA: Insufficient documentation

## 2020-05-06 DIAGNOSIS — Z6841 Body Mass Index (BMI) 40.0 and over, adult: Secondary | ICD-10-CM | POA: Insufficient documentation

## 2020-05-06 DIAGNOSIS — Z992 Dependence on renal dialysis: Secondary | ICD-10-CM | POA: Diagnosis not present

## 2020-05-06 DIAGNOSIS — I120 Hypertensive chronic kidney disease with stage 5 chronic kidney disease or end stage renal disease: Secondary | ICD-10-CM | POA: Insufficient documentation

## 2020-05-06 DIAGNOSIS — N185 Chronic kidney disease, stage 5: Secondary | ICD-10-CM | POA: Diagnosis not present

## 2020-05-06 DIAGNOSIS — E7800 Pure hypercholesterolemia, unspecified: Secondary | ICD-10-CM | POA: Diagnosis not present

## 2020-05-06 DIAGNOSIS — E1122 Type 2 diabetes mellitus with diabetic chronic kidney disease: Secondary | ICD-10-CM | POA: Insufficient documentation

## 2020-05-06 DIAGNOSIS — N186 End stage renal disease: Secondary | ICD-10-CM | POA: Insufficient documentation

## 2020-05-06 DIAGNOSIS — Z79899 Other long term (current) drug therapy: Secondary | ICD-10-CM | POA: Insufficient documentation

## 2020-05-06 HISTORY — PX: BASCILIC VEIN TRANSPOSITION: SHX5742

## 2020-05-06 HISTORY — DX: End stage renal disease: N18.6

## 2020-05-06 HISTORY — DX: End stage renal disease: Z99.2

## 2020-05-06 HISTORY — DX: Hypothyroidism, unspecified: E03.9

## 2020-05-06 LAB — POCT I-STAT, CHEM 8
BUN: 63 mg/dL — ABNORMAL HIGH (ref 6–20)
Calcium, Ion: 1.04 mmol/L — ABNORMAL LOW (ref 1.15–1.40)
Chloride: 99 mmol/L (ref 98–111)
Creatinine, Ser: 17.6 mg/dL — ABNORMAL HIGH (ref 0.61–1.24)
Glucose, Bld: 97 mg/dL (ref 70–99)
HCT: 54 % — ABNORMAL HIGH (ref 39.0–52.0)
Hemoglobin: 18.4 g/dL — ABNORMAL HIGH (ref 13.0–17.0)
Potassium: 4.3 mmol/L (ref 3.5–5.1)
Sodium: 139 mmol/L (ref 135–145)
TCO2: 26 mmol/L (ref 22–32)

## 2020-05-06 LAB — GLUCOSE, CAPILLARY
Glucose-Capillary: 102 mg/dL — ABNORMAL HIGH (ref 70–99)
Glucose-Capillary: 108 mg/dL — ABNORMAL HIGH (ref 70–99)

## 2020-05-06 SURGERY — TRANSPOSITION, VEIN, BASILIC
Anesthesia: General | Site: Arm Upper | Laterality: Right

## 2020-05-06 MED ORDER — DEXAMETHASONE SODIUM PHOSPHATE 10 MG/ML IJ SOLN
INTRAMUSCULAR | Status: AC
  Filled 2020-05-06: qty 2

## 2020-05-06 MED ORDER — PHENYLEPHRINE 40 MCG/ML (10ML) SYRINGE FOR IV PUSH (FOR BLOOD PRESSURE SUPPORT)
PREFILLED_SYRINGE | INTRAVENOUS | Status: AC
  Filled 2020-05-06: qty 10

## 2020-05-06 MED ORDER — LIDOCAINE-EPINEPHRINE 1 %-1:100000 IJ SOLN
INTRAMUSCULAR | Status: AC
  Filled 2020-05-06: qty 1

## 2020-05-06 MED ORDER — HEPARIN SODIUM (PORCINE) 1000 UNIT/ML IJ SOLN
INTRAMUSCULAR | Status: DC | PRN
  Administered 2020-05-06: 3000 [IU] via INTRAVENOUS

## 2020-05-06 MED ORDER — FENTANYL CITRATE (PF) 100 MCG/2ML IJ SOLN
25.0000 ug | INTRAMUSCULAR | Status: DC | PRN
  Administered 2020-05-06: 50 ug via INTRAVENOUS

## 2020-05-06 MED ORDER — OXYCODONE HCL 5 MG PO TABS
5.0000 mg | ORAL_TABLET | Freq: Once | ORAL | Status: AC | PRN
  Administered 2020-05-06: 5 mg via ORAL

## 2020-05-06 MED ORDER — ROCURONIUM BROMIDE 10 MG/ML (PF) SYRINGE
PREFILLED_SYRINGE | INTRAVENOUS | Status: AC
  Filled 2020-05-06: qty 10

## 2020-05-06 MED ORDER — CHLORHEXIDINE GLUCONATE 4 % EX LIQD: 60.0000 mL | Freq: Once | CUTANEOUS | Status: DC

## 2020-05-06 MED ORDER — VASOPRESSIN 20 UNIT/ML IV SOLN
INTRAVENOUS | Status: DC | PRN
  Administered 2020-05-06 (×3): 1 [IU] via INTRAVENOUS

## 2020-05-06 MED ORDER — SUCCINYLCHOLINE CHLORIDE 200 MG/10ML IV SOSY
PREFILLED_SYRINGE | INTRAVENOUS | Status: AC
  Filled 2020-05-06: qty 10

## 2020-05-06 MED ORDER — MIDAZOLAM HCL 2 MG/2ML IJ SOLN
INTRAMUSCULAR | Status: AC
  Filled 2020-05-06: qty 2

## 2020-05-06 MED ORDER — LIDOCAINE 2% (20 MG/ML) 5 ML SYRINGE
INTRAMUSCULAR | Status: AC
  Filled 2020-05-06: qty 10

## 2020-05-06 MED ORDER — PHENYLEPHRINE HCL-NACL 10-0.9 MG/250ML-% IV SOLN
INTRAVENOUS | Status: DC | PRN
  Administered 2020-05-06: 50 ug/min via INTRAVENOUS

## 2020-05-06 MED ORDER — EPHEDRINE 5 MG/ML INJ
INTRAVENOUS | Status: AC
  Filled 2020-05-06: qty 10

## 2020-05-06 MED ORDER — LIDOCAINE-EPINEPHRINE (PF) 1 %-1:200000 IJ SOLN
INTRAMUSCULAR | Status: AC
  Filled 2020-05-06: qty 30

## 2020-05-06 MED ORDER — ORAL CARE MOUTH RINSE: 15.0000 mL | Freq: Once | OROMUCOSAL | Status: AC

## 2020-05-06 MED ORDER — OXYCODONE HCL 5 MG PO TABS
ORAL_TABLET | ORAL | Status: AC
  Filled 2020-05-06: qty 1

## 2020-05-06 MED ORDER — HYDROCODONE-ACETAMINOPHEN 5-325 MG PO TABS: 1.0000 | ORAL_TABLET | Freq: Four times a day (QID) | ORAL | 0 refills | Status: DC | PRN

## 2020-05-06 MED ORDER — SODIUM CHLORIDE 0.9 % IV SOLN: INTRAVENOUS | Status: DC

## 2020-05-06 MED ORDER — ONDANSETRON HCL 4 MG/2ML IJ SOLN
INTRAMUSCULAR | Status: AC
  Filled 2020-05-06: qty 4

## 2020-05-06 MED ORDER — SODIUM CHLORIDE 0.9 % IV SOLN
INTRAVENOUS | Status: AC
  Filled 2020-05-06: qty 1.2

## 2020-05-06 MED ORDER — PROPOFOL 10 MG/ML IV BOLUS
INTRAVENOUS | Status: AC
  Filled 2020-05-06: qty 20

## 2020-05-06 MED ORDER — LIDOCAINE-EPINEPHRINE (PF) 1 %-1:200000 IJ SOLN
INTRAMUSCULAR | Status: DC | PRN
  Administered 2020-05-06: 7 mL

## 2020-05-06 MED ORDER — CHLORHEXIDINE GLUCONATE 0.12 % MT SOLN
OROMUCOSAL | Status: AC
  Administered 2020-05-06: 09:00:00 15 mL via OROMUCOSAL
  Filled 2020-05-06: qty 15

## 2020-05-06 MED ORDER — CHLORHEXIDINE GLUCONATE 0.12 % MT SOLN: 15.0000 mL | Freq: Once | OROMUCOSAL | Status: AC

## 2020-05-06 MED ORDER — SODIUM CHLORIDE 0.9 % IV SOLN: INTRAVENOUS | Status: DC | PRN

## 2020-05-06 MED ORDER — 0.9 % SODIUM CHLORIDE (POUR BTL) OPTIME
TOPICAL | Status: DC | PRN
  Administered 2020-05-06: 12:00:00 1000 mL

## 2020-05-06 MED ORDER — PHENYLEPHRINE 40 MCG/ML (10ML) SYRINGE FOR IV PUSH (FOR BLOOD PRESSURE SUPPORT)
PREFILLED_SYRINGE | INTRAVENOUS | Status: DC | PRN
  Administered 2020-05-06: 80 ug via INTRAVENOUS
  Administered 2020-05-06: 120 ug via INTRAVENOUS
  Administered 2020-05-06: 80 ug via INTRAVENOUS
  Administered 2020-05-06: 200 ug via INTRAVENOUS

## 2020-05-06 MED ORDER — FENTANYL CITRATE (PF) 250 MCG/5ML IJ SOLN
INTRAMUSCULAR | Status: DC | PRN
  Administered 2020-05-06: 100 ug via INTRAVENOUS
  Administered 2020-05-06 (×3): 50 ug via INTRAVENOUS

## 2020-05-06 MED ORDER — MIDAZOLAM HCL 2 MG/2ML IJ SOLN
INTRAMUSCULAR | Status: DC | PRN
  Administered 2020-05-06: 2 mg via INTRAVENOUS

## 2020-05-06 MED ORDER — PROMETHAZINE HCL 25 MG/ML IJ SOLN: 6.2500 mg | INTRAMUSCULAR | Status: DC | PRN

## 2020-05-06 MED ORDER — OXYCODONE HCL 5 MG/5ML PO SOLN: 5.0000 mg | Freq: Once | ORAL | Status: AC | PRN

## 2020-05-06 MED ORDER — ONDANSETRON HCL 4 MG/2ML IJ SOLN
INTRAMUSCULAR | Status: DC | PRN
  Administered 2020-05-06: 4 mg via INTRAVENOUS

## 2020-05-06 MED ORDER — LIDOCAINE 2% (20 MG/ML) 5 ML SYRINGE
INTRAMUSCULAR | Status: DC | PRN
  Administered 2020-05-06: 60 mg via INTRAVENOUS

## 2020-05-06 MED ORDER — ACETAMINOPHEN 10 MG/ML IV SOLN: 1000.0000 mg | Freq: Once | INTRAVENOUS | Status: DC | PRN

## 2020-05-06 MED ORDER — VASOPRESSIN 20 UNIT/ML IV SOLN
INTRAVENOUS | Status: AC
  Filled 2020-05-06: qty 1

## 2020-05-06 MED ORDER — FENTANYL CITRATE (PF) 100 MCG/2ML IJ SOLN
INTRAMUSCULAR | Status: AC
  Filled 2020-05-06: qty 2

## 2020-05-06 MED ORDER — DEXAMETHASONE SODIUM PHOSPHATE 10 MG/ML IJ SOLN
INTRAMUSCULAR | Status: DC | PRN
  Administered 2020-05-06: 5 mg via INTRAVENOUS

## 2020-05-06 MED ORDER — FENTANYL CITRATE (PF) 250 MCG/5ML IJ SOLN
INTRAMUSCULAR | Status: AC
  Filled 2020-05-06: qty 5

## 2020-05-06 MED ORDER — ALBUMIN HUMAN 5 % IV SOLN: INTRAVENOUS | Status: DC | PRN

## 2020-05-06 MED ORDER — PROPOFOL 10 MG/ML IV BOLUS
INTRAVENOUS | Status: DC | PRN
  Administered 2020-05-06: 50 mg via INTRAVENOUS
  Administered 2020-05-06: 200 mg via INTRAVENOUS
  Administered 2020-05-06: 50 mg via INTRAVENOUS
  Administered 2020-05-06: 20 mg via INTRAVENOUS

## 2020-05-06 MED ORDER — SUCCINYLCHOLINE CHLORIDE 20 MG/ML IJ SOLN
INTRAMUSCULAR | Status: DC | PRN
  Administered 2020-05-06: 140 mg via INTRAVENOUS

## 2020-05-06 SURGICAL SUPPLY — 32 items
ARMBAND PINK RESTRICT EXTREMIT (MISCELLANEOUS) ×3 IMPLANT
CANISTER SUCT 3000ML PPV (MISCELLANEOUS) ×3 IMPLANT
CLIP VESOCCLUDE MED 24/CT (CLIP) IMPLANT
CLIP VESOCCLUDE MED 6/CT (CLIP) IMPLANT
CLIP VESOCCLUDE SM WIDE 24/CT (CLIP) IMPLANT
CLIP VESOCCLUDE SM WIDE 6/CT (CLIP) IMPLANT
COVER PROBE W GEL 5X96 (DRAPES) ×3 IMPLANT
DERMABOND ADVANCED (GAUZE/BANDAGES/DRESSINGS) ×2
DERMABOND ADVANCED .7 DNX12 (GAUZE/BANDAGES/DRESSINGS) ×1 IMPLANT
ELECT REM PT RETURN 9FT ADLT (ELECTROSURGICAL) ×3
ELECTRODE REM PT RTRN 9FT ADLT (ELECTROSURGICAL) ×1 IMPLANT
GLOVE BIOGEL PI IND STRL 7.5 (GLOVE) ×1 IMPLANT
GLOVE BIOGEL PI INDICATOR 7.5 (GLOVE) ×2
GLOVE SURG SS PI 7.5 STRL IVOR (GLOVE) ×3 IMPLANT
GOWN STRL REUS W/ TWL LRG LVL3 (GOWN DISPOSABLE) ×2 IMPLANT
GOWN STRL REUS W/ TWL XL LVL3 (GOWN DISPOSABLE) ×1 IMPLANT
GOWN STRL REUS W/TWL LRG LVL3 (GOWN DISPOSABLE) ×4
GOWN STRL REUS W/TWL XL LVL3 (GOWN DISPOSABLE) ×2
HEMOSTAT SNOW SURGICEL 2X4 (HEMOSTASIS) IMPLANT
KIT BASIN OR (CUSTOM PROCEDURE TRAY) ×3 IMPLANT
KIT TURNOVER KIT B (KITS) ×3 IMPLANT
NS IRRIG 1000ML POUR BTL (IV SOLUTION) ×3 IMPLANT
PACK CV ACCESS (CUSTOM PROCEDURE TRAY) ×3 IMPLANT
PAD ARMBOARD 7.5X6 YLW CONV (MISCELLANEOUS) ×6 IMPLANT
SUT PROLENE 6 0 CC (SUTURE) ×3 IMPLANT
SUT SILK 2 0 SH (SUTURE) IMPLANT
SUT VIC AB 3-0 SH 27 (SUTURE) ×2
SUT VIC AB 3-0 SH 27X BRD (SUTURE) ×1 IMPLANT
SUT VICRYL 4-0 PS2 18IN ABS (SUTURE) ×3 IMPLANT
TOWEL GREEN STERILE (TOWEL DISPOSABLE) ×3 IMPLANT
UNDERPAD 30X36 HEAVY ABSORB (UNDERPADS AND DIAPERS) ×3 IMPLANT
WATER STERILE IRR 1000ML POUR (IV SOLUTION) ×3 IMPLANT

## 2020-05-06 NOTE — Op Note (Signed)
    Patient name: Emery Dupuy MRN: [id number] DOB: Jun 08, 1990 Sex: male  05/06/2020 Pre-operative Diagnosis: End-stage renal disease Post-operative diagnosis:  Same Surgeon:  Annamarie Major Assistants: Arlee Muslim Procedure:   First stage right basilic vein fistula creation Anesthesia: General Blood Loss: Minimal Specimens: None  Findings: 3 mm disease-free brachial artery.  3 to 4 mm basilic vein  Indications: The patient comes in today for dialysis access  Procedure:  The patient was identified in the holding area and taken to [place]  The patient was then placed supine on the table. general anesthesia was administered.  The patient was prepped and draped in the usual sterile fashion.  A time out was called and antibiotics were administered.  A PA was necessary to expedite the procedure and assist with suction, retraction, and suture closure.  Ultrasound was used to evaluate the basilic vein in the right arm.  This appeared to be a very healthy 3 to 5 mm vein from the antecubital crease up to the shoulder.  An oblique incision was made just proximal to the antecubital crease.  I first dissected out the brachial artery.  This is a 3 mm disease-free artery.  It was encircled proximally distally with Vesseloops.  I then identified the basilic vein.  This was mobilized throughout the width of the incision.  Side branches were ligated between silk ties.  The vein measured at least 3 mm.  3000 units of heparin was then administered.  The basilic vein was then ligated distally.  It distended nicely with heparin saline.  It was marked for orientation.  Next the brachial artery was occluded with vascular clamps and a #11 blade was used to make an arteriotomy which was extended longitudinally with Potts scissors.  The vein was cut to the appropriate length and spatulated to fit the size the arteriotomy.  A running anastomosis was then created and a end-to-side fashion.  Prior to completion, the  appropriate flushing maneuvers were performed and the anastomosis was completed.  I inspected the course of the vein to make sure there were no kinks or additional branches.  There was a audible Doppler signal within the fistula and a brisk radial and ulnar Doppler signal.  The wound was then irrigated.  Hemostasis was achieved.  The incision was closed with 2 layers of Vicryl followed by Dermabond.  There were no immediate complications.   Disposition: To PACU stable.   Theotis Burrow, M.D., [facility] Vascular and Vein Specialists of [place] Office: [phone] Pager:  [phone]

## 2020-05-06 NOTE — Interval H&P Note (Signed)
History and Physical Interval Note:  05/06/2020 9:53 AM  Paul [name]  has presented today for surgery, with the diagnosis of END STAGE RENAL DISEASE.  The various methods of treatment have been discussed with the patient and family. After consideration of risks, benefits and other options for treatment, the patient has consented to  Procedure(s): [place] (Right) as a surgical intervention.  The patient's history has been reviewed, patient examined, no change in status, stable for surgery.  I have reviewed the patient's chart and labs.  Questions were answered to the patient's satisfaction.     Annamarie Major

## 2020-05-06 NOTE — Transfer of Care (Signed)
Immediate Anesthesia Transfer of Care Note  Patient: Paul [name]  Procedure(s) Performed: FIRST STAGE RIGHT BASCILIC VEIN TRANSPOSITION (Right Arm Upper)  Patient Location: PACU  Anesthesia Type:General  Level of Consciousness: drowsy and patient cooperative  Airway & Oxygen Therapy: Patient Spontanous Breathing  Post-op Assessment: Report given to RN, Post -op Vital signs reviewed and stable and Patient moving all extremities X 4  Post vital signs: Reviewed and stable  Last Vitals:  Vitals Value Taken Time  BP 98/72 05/06/20 1150  Temp    Pulse 89 05/06/20 1150  Resp 21 05/06/20 1150  SpO2 96 % 05/06/20 1150  Vitals shown include unvalidated device data.  Last Pain:  Vitals:   05/06/20 0841  TempSrc: Oral  PainSc: 0-No pain      Patients Stated Pain Goal: 4 (00/45/99 7741)  Complications: No complications documented.

## 2020-05-06 NOTE — Anesthesia Postprocedure Evaluation (Signed)
Anesthesia Post Note  Patient: Noris Kulinski  Procedure(s) Performed: FIRST STAGE RIGHT BASCILIC VEIN TRANSPOSITION (Right Arm Upper)     Patient location during evaluation: PACU Anesthesia Type: General Level of consciousness: awake Pain management: pain level controlled Vital Signs Assessment: post-procedure vital signs reviewed and stable Respiratory status: spontaneous breathing, nonlabored ventilation, respiratory function stable and patient connected to nasal cannula oxygen Cardiovascular status: blood pressure returned to baseline and stable Postop Assessment: no apparent nausea or vomiting Anesthetic complications: no   No complications documented.  Last Vitals:  Vitals:   05/06/20 1150 05/06/20 1204  BP: 98/72 92/70  Pulse: 91 86  Resp: 20 13  Temp: 36.5 C 36.7 C  SpO2: 100% 96%    Last Pain:  Vitals:   05/06/20 1204  TempSrc:   PainSc: 8                  Oniel Meleski P Carneshia Raker

## 2020-05-06 NOTE — Progress Notes (Signed)
Pt's BP pre-op was soft d/t recent dialysis. Pt's BP had MAP of 65-- called [facility] and would like pt to sit up and eat and drink and get a repeat BP before DC. Pt is stable, asymptomatic. He is placed in [place] [facility] for a snack and will be DC'd from there.

## 2020-05-06 NOTE — Anesthesia Preprocedure Evaluation (Addendum)
Anesthesia Evaluation  Patient identified by MRN, date of birth, ID band Patient awake    Reviewed: Allergy & Precautions, NPO status , Patient's Chart, lab work & pertinent test results  Airway Mallampati: I  TM Distance: >3 FB Neck ROM: Full    Dental no notable dental hx.    Pulmonary neg pulmonary ROS, Patient abstained from smoking.,    Pulmonary exam normal breath sounds clear to auscultation       Cardiovascular hypertension, Pt. on medications Normal cardiovascular exam Rhythm:Regular Rate:Normal     Neuro/Psych negative neurological ROS  negative psych ROS   GI/Hepatic negative GI ROS, (+)     substance abuse  ,   Endo/Other  diabetes, Insulin DependentHypothyroidism Morbid obesity  Renal/GU ESRF and DialysisRenal disease     Musculoskeletal negative musculoskeletal ROS (+)   Abdominal (+) + obese,   Peds  Hematology HLD   Anesthesia Other Findings END STAGE RENAL DISEASE  Reproductive/Obstetrics                           Anesthesia Physical Anesthesia Plan  ASA: III  Anesthesia Plan: General   Post-op Pain Management:    Induction: Intravenous  PONV Risk Score and Plan: 2 and Ondansetron, Dexamethasone and Treatment may vary due to age or medical condition  Airway Management Planned: Oral ETT  Additional Equipment:   Intra-op Plan:   Post-operative Plan: Extubation in OR  Informed Consent: I have reviewed the patients History and Physical, chart, labs and discussed the procedure including the risks, benefits and alternatives for the proposed anesthesia with the patient or authorized representative who has indicated his/her understanding and acceptance.     Dental advisory given  Plan Discussed with: CRNA  Anesthesia Plan Comments:         Anesthesia Quick Evaluation

## 2020-05-06 NOTE — Discharge Instructions (Signed)
° °  [facility] [place] ° °Discharge Instructions ° °AV Fistula or Graft Surgery for Dialysis Access ° °Please refer to the following instructions for your post-procedure care. Your surgeon or physician assistant will discuss any changes with you. ° °Activity ° °You may drive the day following your surgery, if you are comfortable and no longer taking prescription pain medication. Resume full activity as the soreness in your incision resolves. ° °Bathing/Showering ° °You may shower after you go home. Keep your incision dry for 48 hours. Do not soak in a bathtub, hot tub, or swim until the incision heals completely. You may not shower if you have a hemodialysis catheter. ° °Incision Care ° °Clean your incision with mild soap and water after 48 hours. Pat the area dry with a clean towel. You do not need a bandage unless otherwise instructed. Do not apply any ointments or creams to your incision. You may have skin glue on your incision. Do not peel it off. It will come off on its own in about one week. Your arm may swell a bit after surgery. To reduce swelling use pillows to elevate your arm so it is above your heart. Your doctor will tell you if you need to lightly wrap your arm with an ACE bandage. ° °Diet ° °Resume your normal diet. There are not special food restrictions following this procedure. In order to heal from your surgery, it is CRITICAL to get adequate nutrition. Your body requires vitamins, minerals, and protein. Vegetables are the best source of vitamins and minerals. Vegetables also provide the perfect balance of protein. Processed food has little nutritional value, so try to avoid this. ° °Medications ° °Resume taking all of your medications. If your incision is causing pain, you may take over-the counter pain relievers such as acetaminophen (Tylenol). If you were prescribed a stronger pain medication, please be aware these medications can cause nausea and constipation. Prevent  nausea by taking the medication with a snack or meal. Avoid constipation by drinking plenty of fluids and eating foods with high amount of fiber, such as fruits, vegetables, and grains. Do not take Tylenol if you are taking prescription pain medications. ° ° ° ° °Follow up °Your surgeon may want to see you in the office following your access surgery. If so, this will be arranged at the time of your surgery. ° °Please call us immediately for any of the following conditions: ° °Increased pain, redness, drainage (pus) from your incision site °Fever of 101 degrees or higher °Severe or worsening pain at your incision site °Hand pain or numbness. ° °Reduce your risk of vascular disease: ° °Stop smoking. If you would like help, call [name] at 1-800-QUIT-NOW (1-800-784-8669) or [facility] at 336-586-4000 ° °Manage your cholesterol °Maintain a desired weight °Control your diabetes °Keep your blood pressure down ° °Dialysis ° °It will take several weeks to several months for your new dialysis access to be ready for use. Your surgeon will determine when it is OK to use it. Your nephrologist will continue to direct your dialysis. You can continue to use your Permcath until your new access is ready for use. ° °If you have any questions, please call the office at [phone]. ° °

## 2020-05-06 NOTE — Anesthesia Procedure Notes (Signed)
Procedure Name: Intubation Date/Time: 05/06/2020 10:30 AM Performed by: Darletta Moll, CRNA Pre-anesthesia Checklist: Patient identified, Emergency Drugs available, Suction available and Patient being monitored Patient Re-evaluated:Patient Re-evaluated prior to induction Oxygen Delivery Method: Circle system utilized Preoxygenation: Pre-oxygenation with 100% oxygen Induction Type: IV induction Ventilation: Mask ventilation without difficulty Laryngoscope Size: Mac and 4 Grade View: Grade I Tube type: Oral Tube size: 7.5 mm Number of attempts: 1 Airway Equipment and Method: Stylet Placement Confirmation: ETT inserted through vocal cords under direct vision,  positive ETCO2 and breath sounds checked- equal and bilateral Secured at: 23 cm Tube secured with: Tape Dental Injury: Teeth and Oropharynx as per pre-operative assessment

## 2020-05-07 ENCOUNTER — Encounter (HOSPITAL_COMMUNITY): Payer: Self-pay | Admitting: Surgery

## 2020-05-19 ENCOUNTER — Other Ambulatory Visit: Payer: Self-pay

## 2020-05-19 DIAGNOSIS — N186 End stage renal disease: Secondary | ICD-10-CM

## 2020-05-21 ENCOUNTER — Ambulatory Visit: Payer: Medicaid Other | Admitting: Family Medicine

## 2020-05-22 DIAGNOSIS — Z992 Dependence on renal dialysis: Secondary | ICD-10-CM | POA: Diagnosis not present

## 2020-05-22 DIAGNOSIS — E1129 Type 2 diabetes mellitus with other diabetic kidney complication: Secondary | ICD-10-CM | POA: Diagnosis not present

## 2020-05-22 DIAGNOSIS — N186 End stage renal disease: Secondary | ICD-10-CM | POA: Diagnosis not present

## 2020-05-29 ENCOUNTER — Other Ambulatory Visit: Payer: Self-pay

## 2020-05-29 DIAGNOSIS — E1122 Type 2 diabetes mellitus with diabetic chronic kidney disease: Secondary | ICD-10-CM

## 2020-05-29 MED ORDER — ACCU-CHEK SOFT TOUCH LANCETS MISC
1.0000 | Freq: Every day | 12 refills | Status: DC
Start: 2020-05-29 — End: 2020-06-01

## 2020-05-29 MED ORDER — ROSUVASTATIN CALCIUM 20 MG PO TABS: 40.0000 mg | ORAL_TABLET | Freq: Every day | ORAL | 0 refills | Status: DC

## 2020-05-30 ENCOUNTER — Other Ambulatory Visit: Payer: Self-pay | Admitting: Family Medicine

## 2020-05-30 DIAGNOSIS — Z794 Long term (current) use of insulin: Secondary | ICD-10-CM

## 2020-05-30 DIAGNOSIS — E1129 Type 2 diabetes mellitus with other diabetic kidney complication: Secondary | ICD-10-CM

## 2020-06-01 ENCOUNTER — Telehealth: Payer: Self-pay | Admitting: Family Medicine

## 2020-06-01 ENCOUNTER — Other Ambulatory Visit: Payer: Self-pay | Admitting: Family Medicine

## 2020-06-01 DIAGNOSIS — E1122 Type 2 diabetes mellitus with diabetic chronic kidney disease: Secondary | ICD-10-CM

## 2020-06-01 DIAGNOSIS — Z794 Long term (current) use of insulin: Secondary | ICD-10-CM

## 2020-06-01 NOTE — Telephone Encounter (Signed)
Called [facility] to get recent office visits and labs for patient in preparation for his upcoming appointment on 06/05/20. Will be faxed over today. Will notify Dr. Jeannine Kitten, who patient will be seeing on 06/05/20 for any patient updates/needs.   Wilber Oliphant, M.D.  9:34 AM 06/01/2020

## 2020-06-02 ENCOUNTER — Encounter: Payer: Self-pay | Admitting: Family Medicine

## 2020-06-03 ENCOUNTER — Encounter (HOSPITAL_COMMUNITY): Payer: Medicaid Other

## 2020-06-04 NOTE — Progress Notes (Deleted)
    SUBJECTIVE:   CHIEF COMPLAINT / HPI:   DM: ophtho, foot, a1c  Hypothyroid: synthroid 26mcg  Hld: crestor 20mg    ESRD:   HTN: amlodipine    PERTINENT  PMH / PSH: ***  OBJECTIVE:   There were no vitals taken for this visit.  ***  ASSESSMENT/PLAN:   No problem-specific Assessment & Plan notes found for this encounter.     Benay Pike, MD [place]

## 2020-06-05 ENCOUNTER — Ambulatory Visit: Payer: Medicaid Other | Admitting: Family Medicine

## 2020-06-10 ENCOUNTER — Ambulatory Visit (HOSPITAL_COMMUNITY)
Admission: RE | Admit: 2020-06-10 | Discharge: 2020-06-10 | Disposition: A | Discharge status: Home / Self Care | Payer: Medicaid Other | Source: Ambulatory Visit | Attending: Surgery | Admitting: Surgery

## 2020-06-10 ENCOUNTER — Ambulatory Visit (INDEPENDENT_AMBULATORY_CARE_PROVIDER_SITE_OTHER): Payer: Self-pay | Admitting: Physician Assistant

## 2020-06-10 ENCOUNTER — Other Ambulatory Visit: Payer: Self-pay

## 2020-06-10 VITALS — BP 112/78 | HR 102 | Temp 98.5°F | Resp 20 | Ht 73.0 in | Wt 314.3 lb

## 2020-06-10 DIAGNOSIS — Z992 Dependence on renal dialysis: Secondary | ICD-10-CM | POA: Diagnosis not present

## 2020-06-10 DIAGNOSIS — N186 End stage renal disease: Secondary | ICD-10-CM

## 2020-06-10 NOTE — Progress Notes (Signed)
POST OPERATIVE OFFICE NOTE    CC:  F/u for surgery  HPI:  This is a 30 y.o. male who is s/p right 1st stage BVT on 05/06/2020 by Dr. Trula Slade.     Pt has hx of left brachiocephalic fistula on 0/01/6282 by Dr. Oneida Alar.  He then had a left upper arm loop graft by Dr. Donnetta Hutching on 12/02/2016.  In 2019 he presented with active bleeding from his graft which required revision by Dr. Carlis Abbott.  The graft no longer had a thrill and that is when he underwent 1st stage right BVT.   He presents today for follow up.  Pt states he does not have pain/numbness in right hand.  He is currently dialyzing via tunneled dialysis catheter that was placed about 4 months ago at [facility].  He states it is working well.   The pt is on dialysis T/T/S at [facility] location.   Allergies  Allergen Reactions  . Hydralazine Hcl Other (See Comments)    DRUG-INDUCED LUPUS    Current Outpatient Medications  Medication Sig Dispense Refill  . Accu-Chek FastClix Lancets MISC USE AS DIRECTED ONCE DAILY 102 each 0  . acetaminophen (TYLENOL) 500 MG tablet Take 1,000 mg by mouth every 6 (six) hours as needed (for pain.).    Marland Kitchen amLODipine (NORVASC) 5 MG tablet Take 5 mg on nondialysis days. (Patient taking differently: Take 5 mg by mouth every Monday, Wednesday, Friday, Saturday, and Sunday at 6 PM. Take 5 mg on nondialysis days.) 90 tablet 2  . Blood Glucose Monitoring Suppl (ACCU-CHEK GUIDE ME) w/Device KIT 1 kit by Does not apply route daily. 1 kit 0  . cinacalcet (SENSIPAR) 60 MG tablet Take 60 mg by mouth every evening.     . ferric citrate (AURYXIA) 1 GM 210 MG(Fe) tablet Take 420-840 mg by mouth See admin instructions. Take 4 tablets (840 mg) by mouth with each meals & take 2-3 tablets (420 mg-630 mg) by mouth with each snack.    Marland Kitchen glucose blood (ACCU-CHEK GUIDE) test strip 1 each by Other route 2 (two) times daily. Use as instructed 100 each 12  . HYDROcodone-acetaminophen (NORCO) 5-325 MG tablet Take 1 tablet by mouth  every 6 (six) hours as needed for moderate pain. 10 tablet 0  . insulin glargine (LANTUS) 100 UNIT/ML injection INJECT 0.1-0.15 MLS (10-15 UNITS) INTO THE SKIN 2 TIMES DAILY. PATIENT NEEDS APPOINTMENT 10 mL 0  . levothyroxine (SYNTHROID) 75 MCG tablet Take 1 tablet (75 mcg total) by mouth daily before breakfast. 30 tablet 0  . multivitamin (RENA-VIT) TABS tablet Take 1 tablet by mouth at bedtime. 30 tablet 0  . rosuvastatin (CRESTOR) 20 MG tablet Take 2 tablets (40 mg total) by mouth daily. 90 tablet 0  . Syringe, Disposable, (B-D SYRINGE SLIP TIP 30CC) 30 ML MISC 1 Syringe by Does not apply route daily. 30 each 11   No current facility-administered medications for this visit.     ROS:  See HPI  Physical Exam:  Today's Vitals   06/10/20 1444  BP: 112/78  Pulse: (!) 102  Resp: 20  Temp: 98.5 F (36.9 C)  TempSrc: Temporal  SpO2: 97%  Weight: (!) 314 lb 4.8 oz (142.6 kg)  Height: 6' 1"  (1.854 m)   Body mass index is 41.47 kg/m.   Incision:  Welled healed with 53m superficial dehiscence  Extremities:   There is a palpable right radial pulse.   Motor and sensory are in tact.   There is not  a thrill/bruit present.    Dialysis Duplex on 06/10/2020: Anastomosis appears occluded.    Assessment/Plan:  This is a 30 y.o. male who is s/p: Right 1st stage BVT on 05/06/2020 by Dr. Trula Slade  -the pt does not have evidence of steal.  His fistula is occluded.   -discussed with Dr. Oneida Alar and will set pt up for central venogram on a M/W/F given early failure of fistula.  If no central venous stenosis, can plan for right upper arm graft in the future.   -discussed with pt and his sister and questions are answered.     Leontine Locket, [facility] [phone]  Clinic MD:  Scot Dock

## 2020-06-10 NOTE — H&P (View-Only) (Signed)
POST OPERATIVE OFFICE NOTE    CC:  F/u for surgery  HPI:  This is a 30 y.o. male who is s/p right 1st stage BVT on 05/06/2020 by Dr. Trula Slade.     Pt has hx of left brachiocephalic fistula on [birth year]1948 by Dr. Oneida Alar.  He then had a left upper arm loop graft by Dr. Donnetta Hutching on 12/02/2016.  In 2019 he presented with active bleeding from his graft which required revision by Dr. Carlis Abbott.  The graft no longer had a thrill and that is when he underwent 1st stage right BVT.   He presents today for follow up.  Pt states he does not have pain/numbness in right hand.  He is currently dialyzing via tunneled dialysis catheter that was placed about 4 months ago at [facility].  He states it is working well.   The pt is on dialysis T/T/S at [facility] location.   Allergies  Allergen Reactions  . Hydralazine Hcl Other (See Comments)    DRUG-INDUCED LUPUS    Current Outpatient Medications  Medication Sig Dispense Refill  . Accu-Chek FastClix Lancets MISC USE AS DIRECTED ONCE DAILY 102 each 0  . acetaminophen (TYLENOL) 500 MG tablet Take 1,000 mg by mouth every 6 (six) hours as needed (for pain.).    Marland Kitchen amLODipine (NORVASC) 5 MG tablet Take 5 mg on nondialysis days. (Patient taking differently: Take 5 mg by mouth every Monday, Wednesday, Friday, Saturday, and Sunday at 6 PM. Take 5 mg on nondialysis days.) 90 tablet 2  . Blood Glucose Monitoring Suppl (ACCU-CHEK GUIDE ME) w/Device KIT 1 kit by Does not apply route daily. 1 kit 0  . cinacalcet (SENSIPAR) 60 MG tablet Take 60 mg by mouth every evening.     . ferric citrate (AURYXIA) 1 GM 210 MG(Fe) tablet Take 420-840 mg by mouth See admin instructions. Take 4 tablets (840 mg) by mouth with each meals & take 2-3 tablets (420 mg-630 mg) by mouth with each snack.    Marland Kitchen glucose blood (ACCU-CHEK GUIDE) test strip 1 each by Other route 2 (two) times daily. Use as instructed 100 each 12  . HYDROcodone-acetaminophen (NORCO) 5-325 MG tablet Take 1 tablet by mouth  every 6 (six) hours as needed for moderate pain. 10 tablet 0  . insulin glargine (LANTUS) 100 UNIT/ML injection INJECT 0.1-0.15 MLS (10-15 UNITS) INTO THE SKIN 2 TIMES DAILY. PATIENT NEEDS APPOINTMENT 10 mL 0  . levothyroxine (SYNTHROID) 75 MCG tablet Take 1 tablet (75 mcg total) by mouth daily before breakfast. 30 tablet 0  . multivitamin (RENA-VIT) TABS tablet Take 1 tablet by mouth at bedtime. 30 tablet 0  . rosuvastatin (CRESTOR) 20 MG tablet Take 2 tablets (40 mg total) by mouth daily. 90 tablet 0  . Syringe, Disposable, (B-D SYRINGE SLIP TIP 30CC) 30 ML MISC 1 Syringe by Does not apply route daily. 30 each 11   No current facility-administered medications for this visit.     ROS:  See HPI  Physical Exam:  Today's Vitals   06/10/20 1444  BP: 112/78  Pulse: (!) 102  Resp: 20  Temp: 98.5 F (36.9 C)  TempSrc: Temporal  SpO2: 97%  Weight: (!) 314 lb 4.8 oz (142.6 kg)  Height: 6' 1"  (1.854 m)   Body mass index is 41.47 kg/m.   Incision:  Welled healed with 61m superficial dehiscence  Extremities:   There is a palpable right radial pulse.   Motor and sensory are in tact.   There is not  a thrill/bruit present.    Dialysis Duplex on 06/10/2020: Anastomosis appears occluded.    Assessment/Plan:  This is a 30 y.o. male who is s/p: Right 1st stage BVT on 05/06/2020 by Dr. Trula Slade  -the pt does not have evidence of steal.  His fistula is occluded.   -discussed with Dr. Oneida Alar and will set pt up for central venogram on a M/W/F given early failure of fistula.  If no central venous stenosis, can plan for right upper arm graft in the future.   -discussed with pt and his sister and questions are answered.     Leontine Locket, [facility] [phone]  Clinic MD:  Scot Dock

## 2020-06-15 ENCOUNTER — Other Ambulatory Visit (HOSPITAL_COMMUNITY)
Admission: RE | Admit: 2020-06-15 | Discharge: 2020-06-15 | Disposition: A | Discharge status: Home / Self Care | Payer: Medicaid Other | Source: Ambulatory Visit | Attending: Vascular Surgery | Admitting: Vascular Surgery

## 2020-06-15 DIAGNOSIS — Z20822 Contact with and (suspected) exposure to covid-19: Secondary | ICD-10-CM | POA: Diagnosis not present

## 2020-06-15 DIAGNOSIS — Z01812 Encounter for preprocedural laboratory examination: Secondary | ICD-10-CM | POA: Diagnosis present

## 2020-06-15 LAB — SARS CORONAVIRUS 2 (TAT 6-24 HRS): SARS Coronavirus 2: NEGATIVE

## 2020-06-17 ENCOUNTER — Ambulatory Visit (HOSPITAL_COMMUNITY)
Admission: RE | Admit: 2020-06-17 | Discharge: 2020-06-17 | Disposition: A | Discharge status: Home / Self Care | Payer: Medicaid Other | Attending: Vascular Surgery | Admitting: Vascular Surgery

## 2020-06-17 ENCOUNTER — Encounter (HOSPITAL_COMMUNITY): Payer: Self-pay | Admitting: Vascular Surgery

## 2020-06-17 ENCOUNTER — Encounter (HOSPITAL_COMMUNITY)
Admission: RE | Disposition: A | Discharge status: Home / Self Care | Payer: Self-pay | Source: Home / Self Care | Attending: Vascular Surgery

## 2020-06-17 DIAGNOSIS — Z79890 Hormone replacement therapy (postmenopausal): Secondary | ICD-10-CM | POA: Insufficient documentation

## 2020-06-17 DIAGNOSIS — Z992 Dependence on renal dialysis: Secondary | ICD-10-CM | POA: Insufficient documentation

## 2020-06-17 DIAGNOSIS — N186 End stage renal disease: Secondary | ICD-10-CM | POA: Diagnosis present

## 2020-06-17 DIAGNOSIS — Z794 Long term (current) use of insulin: Secondary | ICD-10-CM | POA: Diagnosis not present

## 2020-06-17 DIAGNOSIS — Z79899 Other long term (current) drug therapy: Secondary | ICD-10-CM | POA: Insufficient documentation

## 2020-06-17 DIAGNOSIS — N185 Chronic kidney disease, stage 5: Secondary | ICD-10-CM

## 2020-06-17 HISTORY — PX: VISCERAL ANGIOGRAPHY: CATH118276

## 2020-06-17 LAB — POCT I-STAT, CHEM 8
BUN: 58 mg/dL — ABNORMAL HIGH (ref 6–20)
Calcium, Ion: 0.9 mmol/L — ABNORMAL LOW (ref 1.15–1.40)
Chloride: 99 mmol/L (ref 98–111)
Creatinine, Ser: >18 mg/dL — ABNORMAL HIGH (ref 0.61–1.24)
Glucose, Bld: 100 mg/dL — ABNORMAL HIGH (ref 70–99)
HCT: 48 % (ref 39.0–52.0)
Hemoglobin: 16.3 g/dL (ref 13.0–17.0)
Potassium: 4.4 mmol/L (ref 3.5–5.1)
Sodium: 137 mmol/L (ref 135–145)
TCO2: 25 mmol/L (ref 22–32)

## 2020-06-17 LAB — GLUCOSE, CAPILLARY: Glucose-Capillary: 132 mg/dL — ABNORMAL HIGH (ref 70–99)

## 2020-06-17 SURGERY — VISCERAL ANGIOGRAPHY
Anesthesia: LOCAL | Laterality: Right

## 2020-06-17 MED ORDER — SODIUM CHLORIDE 0.9 % IV SOLN: 250.0000 mL | INTRAVENOUS | Status: DC | PRN

## 2020-06-17 MED ORDER — SODIUM CHLORIDE 0.9% FLUSH: 3.0000 mL | INTRAVENOUS | Status: DC | PRN

## 2020-06-17 MED ORDER — IODIXANOL 320 MG/ML IV SOLN
INTRAVENOUS | Status: DC | PRN
  Administered 2020-06-17: 50 mL

## 2020-06-17 MED ORDER — SODIUM CHLORIDE 0.9% FLUSH: 3.0000 mL | Freq: Two times a day (BID) | INTRAVENOUS | Status: DC

## 2020-06-17 SURGICAL SUPPLY — 2 items
STOPCOCK MORSE 400PSI 3WAY (MISCELLANEOUS) ×2 IMPLANT
TUBING CIL FLEX 10 FLL-RA (TUBING) ×2 IMPLANT

## 2020-06-17 NOTE — Op Note (Signed)
DATE OF SERVICE: 06/17/2020  PATIENT:  Paul [name]  30 y.o. male  PRE-OPERATIVE DIAGNOSIS:  ESRD  POST-OPERATIVE DIAGNOSIS:  Same  PROCEDURE:   Right upper extremity and central venogram (4m contrast)  SURGEON:  Surgeon(s) and Role:    * HCherre Robins MD - Primary  ASSISTANT: none  An assistant was required to facilitate exposure and expedite the case.  ANESTHESIA:   none  EBL: min  BLOOD ADMINISTERED:none  DRAINS: none   LOCAL MEDICATIONS USED:  NONE  SPECIMEN:  none  COUNTS: confirmed correct.  TOURNIQUET:  None  PATIENT DISPOSITION:  PACU - hemodynamically stable.   Delay start of Pharmacological VTE agent (>24hrs) due to surgical blood loss or risk of bleeding: no  INDICATION FOR PROCEDURE: MAdhav Croxfordis a 30y.o. male with ESRD in need of HD access. After careful discussion of risks, benefits, and alternatives the patient was offered venogram. The patient understood and wished to proceed.  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 The patient was positioned supine on the operating room table. Anesthesia was induced. The right hand was prepped and draped in standard fashion. A surgical pause was performed confirming correct patient, procedure, and operative location.  After cannulating the superficial veins with a peripheral IV catheter, several venograms were obtained with the arm adducted and abducted. Findings above.   Upon completion of the case instrument and sharps counts were confirmed correct. The patient was transferred to the PACU in good  condition. I was present for all portions of the procedure.  TYevonne Aline HStanford Breed MD [facility] CenterPhone Number: ([phone]/26/2022 8:36 AM

## 2020-06-17 NOTE — Progress Notes (Signed)
Discharge instructions reviewed with pt voices understanding.  

## 2020-06-17 NOTE — Interval H&P Note (Signed)
History and Physical Interval Note:  06/17/2020 8:36 AM  Paul [name]  has presented today for surgery, with the diagnosis of END STAGE RENAL DISEASE.  The various methods of treatment have been discussed with the patient and family. After consideration of risks, benefits and other options for treatment, the patient has consented to  Procedure(s): CENTRAL VENO (Right) as a surgical intervention.  The patient's history has been reviewed, patient examined, no change in status, stable for surgery.  I have reviewed the patient's chart and labs.  Questions were answered to the patient's satisfaction.     Cherre Robins

## 2020-06-17 NOTE — Discharge Instructions (Signed)
Venogram A venogram, or venography, is a procedure that uses an X-ray and dye (contrast) to examine how well the veins work and how blood flows through them. Contrast helps the veins show up on X-rays. A venogram may be done:  To evaluate abnormalities in the vein.  To identify clots within veins, such as deep vein thrombosis (DVT).  To map out the veins that might be needed for another procedure. Tell a health care provider about:  Any allergies you have, especially to medicines, shellfish, iodine, and contrast.  All medicines you are taking, including vitamins, herbs, eye drops, creams, and over-the-counter medicines.  Any problems you or family members have had with anesthetic medicines.  Any blood disorders you have.  Any surgeries you have had and any complications that occurred.  Any medical conditions you have.  Whether you are pregnant, may be pregnant, or are breastfeeding.  Any history of smoking or tobacco use. What are the risks? Generally, this is a safe procedure. However, problems may occur, including:  Infection.  Bleeding.  Blood clots.  Allergic reaction to medicines or contrast.  Damage to other structures or organs.  Kidney problems.  Increased risk of cancer. Being exposed to too much radiation over a lifetime can increase the risk of cancer. The risk is small. What happens before the procedure? Medicines Ask your health care provider about:  Changing or stopping your regular medicines. This is especially important if you are taking diabetes medicines or blood thinners.  Taking medicines such as aspirin and ibuprofen. These medicines can thin your blood. Do not take these medicines unless your health care provider tells you to take them.  Taking over-the-counter medicines, vitamins, herbs, and supplements. General instructions  Follow instructions from your health care provider about eating or drinking restrictions.  You may have blood tests  to check how well your kidneys and liver are working and how well your blood can clot.  Plan to have someone take you home from the hospital or clinic. What happens during the procedure?  An IV will be inserted into one of your veins.  You may be given a medicine to help you relax (sedative).  You will lie down on an X-ray table. The table may be tilted in different directions during the procedure to help the contrast move throughout your body. Safety straps will keep you secure if the table is tilted.  If veins in your arm or leg will be examined, a band may be wrapped around that arm or leg to keep the veins full of blood. This may cause your arm or leg to feel numb.  The contrast will be injected into your IV. You may have a hot, flushed feeling as it moves throughout your body. You may also have a metallic taste in your mouth. Both of these sensations will go away after the test is complete.  You may be asked to lie in different positions or place your legs or arms in different positions.  At the end of the procedure, you may be given IV fluids to help wash or flush the contrast out of your veins.  The IV will be removed, and pressure will be applied to the IV site to prevent bleeding. A bandage (dressing) may be applied to the IV site. The exact procedure may vary among health care providers and hospitals.   What can I expect after the procedure?  Your blood pressure, heart rate, breathing rate, and blood oxygen level will be monitored until   you leave the hospital or clinic.  You may be given something to eat and drink.  You may have bruising or mild discomfort in the area where the IV was inserted. Follow these instructions at home: Eating and drinking  Follow instructions from your health care provider about eating or drinking restrictions.  Drink a lot of water for the first several days after the procedure, as directed by your health care provider. This helps to flush the  contrast out of your body.   Activity  Rest as told by your health care provider.  Return to your normal activities as told by your health care provider. Ask your health care provider what activities are safe for you.  If you were given a sedative during your procedure, do not drive for 24 hours or until your health care provider approves. General instructions  Check your IV insertion area every day for signs of infection. Check for: ? Redness, swelling, or pain. ? Fluid or blood. ? Warmth. ? Pus or a bad smell.  Take over-the-counter and prescription medicines only as told by your health care provider.  Keep all follow-up visits as told by your health care provider. This is important. Contact a health care provider if:  Your skin becomes itchy or you develop a rash or hives.  You have a fever that does not get better with medicine.  You feel nauseous or you vomit.  You have redness, swelling, or pain around the insertion site.  You have fluid or blood coming from the insertion site.  Your insertion area feels warm to the touch.  You have pus or a bad smell coming from the insertion site. Get help right away if you:  Have shortness of breath or difficulty breathing.  Develop chest pain.  Faint.  Feel very dizzy. These symptoms may represent a serious problem that is an emergency. Do not wait to see if the symptoms will go away. Get medical help right away. Call your local emergency services (911 in the [place] Do not drive yourself to the hospital. Summary  A venogram, or venography, is a procedure that uses an X-ray and contrast dye to check how well the veins work and how blood flows through them.  An IV will be inserted into one of your veins in order to inject the contrast.  During the exam, you will lie on an X-ray table. The table may be tilted in different directions during the procedure to help the contrast move throughout your body. Safety straps will keep  you secure.  After the procedure, you will need to drink a lot of water to help wash or flush the contrast out of your body. This information is not intended to replace advice given to you by your health care provider. Make sure you discuss any questions you have with your health care provider. Document Revised: 12/15/2018 Document Reviewed: 12/15/2018 Elsevier Patient Education  2021 [employer]  

## 2020-06-18 ENCOUNTER — Other Ambulatory Visit: Payer: Self-pay

## 2020-06-19 ENCOUNTER — Encounter (HOSPITAL_COMMUNITY): Payer: Self-pay | Admitting: Vascular Surgery

## 2020-06-22 DIAGNOSIS — Z992 Dependence on renal dialysis: Secondary | ICD-10-CM | POA: Diagnosis not present

## 2020-06-22 DIAGNOSIS — E1129 Type 2 diabetes mellitus with other diabetic kidney complication: Secondary | ICD-10-CM | POA: Diagnosis not present

## 2020-06-22 DIAGNOSIS — N186 End stage renal disease: Secondary | ICD-10-CM | POA: Diagnosis not present

## 2020-07-02 ENCOUNTER — Other Ambulatory Visit (HOSPITAL_COMMUNITY)
Admission: RE | Admit: 2020-07-02 | Discharge: 2020-07-02 | Disposition: A | Discharge status: Home / Self Care | Payer: Medicaid Other | Source: Ambulatory Visit | Attending: Vascular Surgery | Admitting: Vascular Surgery

## 2020-07-02 ENCOUNTER — Encounter (HOSPITAL_COMMUNITY): Payer: Self-pay | Admitting: Vascular Surgery

## 2020-07-02 ENCOUNTER — Other Ambulatory Visit: Payer: Self-pay

## 2020-07-02 DIAGNOSIS — Z20822 Contact with and (suspected) exposure to covid-19: Secondary | ICD-10-CM | POA: Insufficient documentation

## 2020-07-02 DIAGNOSIS — Z01812 Encounter for preprocedural laboratory examination: Secondary | ICD-10-CM | POA: Insufficient documentation

## 2020-07-02 LAB — SARS CORONAVIRUS 2 (TAT 6-24 HRS): SARS Coronavirus 2: NEGATIVE

## 2020-07-02 MED ORDER — DEXTROSE 5 % IV SOLN
3.0000 g | INTRAVENOUS | Status: DC
  Filled 2020-07-02: qty 3000

## 2020-07-02 NOTE — Progress Notes (Signed)
Mr. Flach denies chest pain or shortness of breath. Patient was tested for Covid today and has been in quarantine since that time.  Mr. Kenton Kingfisher has type II diabetes. Patient states that CBG - fasting averages 110. I instructed Mr. Greeney to take 10 units of Lantus tonight and in am if CBG is greater than 70. I instructed patient to check CBG after awaking and every 2 hours until arrival  to the hospital.  I Instructed patient if CBG is less than 70 to take 4 Glucose Tablets or 1 tube of Glucose Gel or 1/2 cup of a clear juice. Recheck CBG in 15 minutes then call pre- op desk at [phone] for further instructions.

## 2020-07-02 NOTE — Anesthesia Preprocedure Evaluation (Addendum)
Anesthesia Evaluation  Patient identified by MRN, date of birth, ID band Patient awake    Reviewed: Allergy & Precautions, NPO status , Patient's Chart, lab work & pertinent test results  Airway Mallampati: II  TM Distance: >3 FB Neck ROM: Full    Dental  (+) Dental Advisory Given, Teeth Intact   Pulmonary pneumonia, resolved, Patient abstained from smoking.,    Pulmonary exam normal breath sounds clear to auscultation       Cardiovascular hypertension, Pt. on medications Normal cardiovascular exam Rhythm:Regular Rate:Normal     Neuro/Psych  Headaches, negative psych ROS   GI/Hepatic GERD  ,(+)     substance abuse  ,   Endo/Other  diabetes, Insulin DependentHypothyroidism Morbid obesity  Renal/GU ESRF and DialysisRenal disease     Musculoskeletal negative musculoskeletal ROS (+)   Abdominal (+) + obese,   Peds  Hematology  (+) anemia , HLD   Anesthesia Other Findings END STAGE RENAL DISEASE  Reproductive/Obstetrics                            Anesthesia Physical  Anesthesia Plan  ASA: III  Anesthesia Plan: General   Post-op Pain Management:    Induction: Intravenous  PONV Risk Score and Plan: 2 and Ondansetron, Dexamethasone, Treatment may vary due to age or medical condition, Midazolam and Diphenhydramine  Airway Management Planned: LMA  Additional Equipment: None  Intra-op Plan:   Post-operative Plan: Extubation in OR  Informed Consent: I have reviewed the patients History and Physical, chart, labs and discussed the procedure including the risks, benefits and alternatives for the proposed anesthesia with the patient or authorized representative who has indicated his/her understanding and acceptance.     Dental advisory given  Plan Discussed with: CRNA  Anesthesia Plan Comments:        Anesthesia Quick Evaluation

## 2020-07-03 ENCOUNTER — Encounter (HOSPITAL_COMMUNITY)
Admission: RE | Disposition: A | Discharge status: Home / Self Care | Payer: Self-pay | Source: Home / Self Care | Attending: Vascular Surgery

## 2020-07-03 ENCOUNTER — Ambulatory Visit (HOSPITAL_COMMUNITY)
Admission: RE | Admit: 2020-07-03 | Discharge: 2020-07-03 | Disposition: A | Discharge status: Home / Self Care | Payer: Medicaid Other | Attending: Vascular Surgery | Admitting: Vascular Surgery

## 2020-07-03 ENCOUNTER — Ambulatory Visit (HOSPITAL_COMMUNITY): Payer: Medicaid Other | Admitting: Anesthesiology

## 2020-07-03 ENCOUNTER — Encounter (HOSPITAL_COMMUNITY): Payer: Self-pay | Admitting: Vascular Surgery

## 2020-07-03 DIAGNOSIS — Z992 Dependence on renal dialysis: Secondary | ICD-10-CM | POA: Diagnosis not present

## 2020-07-03 DIAGNOSIS — Z794 Long term (current) use of insulin: Secondary | ICD-10-CM | POA: Insufficient documentation

## 2020-07-03 DIAGNOSIS — N186 End stage renal disease: Secondary | ICD-10-CM | POA: Diagnosis not present

## 2020-07-03 DIAGNOSIS — Z6841 Body Mass Index (BMI) 40.0 and over, adult: Secondary | ICD-10-CM | POA: Diagnosis not present

## 2020-07-03 DIAGNOSIS — T82858A Stenosis of vascular prosthetic devices, implants and grafts, initial encounter: Secondary | ICD-10-CM | POA: Insufficient documentation

## 2020-07-03 DIAGNOSIS — Z79899 Other long term (current) drug therapy: Secondary | ICD-10-CM | POA: Diagnosis not present

## 2020-07-03 DIAGNOSIS — Z79890 Hormone replacement therapy (postmenopausal): Secondary | ICD-10-CM | POA: Insufficient documentation

## 2020-07-03 DIAGNOSIS — E1122 Type 2 diabetes mellitus with diabetic chronic kidney disease: Secondary | ICD-10-CM | POA: Diagnosis not present

## 2020-07-03 DIAGNOSIS — I120 Hypertensive chronic kidney disease with stage 5 chronic kidney disease or end stage renal disease: Secondary | ICD-10-CM | POA: Insufficient documentation

## 2020-07-03 DIAGNOSIS — D631 Anemia in chronic kidney disease: Secondary | ICD-10-CM | POA: Diagnosis not present

## 2020-07-03 DIAGNOSIS — Y832 Surgical operation with anastomosis, bypass or graft as the cause of abnormal reaction of the patient, or of later complication, without mention of misadventure at the time of the procedure: Secondary | ICD-10-CM | POA: Diagnosis not present

## 2020-07-03 DIAGNOSIS — N185 Chronic kidney disease, stage 5: Secondary | ICD-10-CM | POA: Diagnosis not present

## 2020-07-03 HISTORY — PX: AV FISTULA PLACEMENT: SHX1204

## 2020-07-03 LAB — POCT I-STAT, CHEM 8
BUN: 46 mg/dL — ABNORMAL HIGH (ref 6–20)
Calcium, Ion: 0.9 mmol/L — ABNORMAL LOW (ref 1.15–1.40)
Chloride: 97 mmol/L — ABNORMAL LOW (ref 98–111)
Creatinine, Ser: 16.3 mg/dL — ABNORMAL HIGH (ref 0.61–1.24)
Glucose, Bld: 137 mg/dL — ABNORMAL HIGH (ref 70–99)
HCT: 50 % (ref 39.0–52.0)
Hemoglobin: 17 g/dL (ref 13.0–17.0)
Potassium: 3.7 mmol/L (ref 3.5–5.1)
Sodium: 138 mmol/L (ref 135–145)
TCO2: 24 mmol/L (ref 22–32)

## 2020-07-03 LAB — GLUCOSE, CAPILLARY
Glucose-Capillary: 157 mg/dL — ABNORMAL HIGH (ref 70–99)
Glucose-Capillary: 138 mg/dL — ABNORMAL HIGH (ref 70–99)

## 2020-07-03 SURGERY — INSERTION OF ARTERIOVENOUS (AV) GORE-TEX GRAFT ARM
Anesthesia: General | Site: Arm Upper | Laterality: Right

## 2020-07-03 MED ORDER — EPHEDRINE 5 MG/ML INJ
INTRAVENOUS | Status: AC
  Filled 2020-07-03: qty 10

## 2020-07-03 MED ORDER — AMISULPRIDE (ANTIEMETIC) 5 MG/2ML IV SOLN
INTRAVENOUS | Status: AC
  Administered 2020-07-03: 10 mg
  Filled 2020-07-03: qty 4

## 2020-07-03 MED ORDER — ROCURONIUM BROMIDE 10 MG/ML (PF) SYRINGE
PREFILLED_SYRINGE | INTRAVENOUS | Status: AC
  Filled 2020-07-03: qty 10

## 2020-07-03 MED ORDER — MEPERIDINE HCL 25 MG/ML IJ SOLN: 6.2500 mg | INTRAMUSCULAR | Status: DC | PRN

## 2020-07-03 MED ORDER — PHENYLEPHRINE HCL-NACL 10-0.9 MG/250ML-% IV SOLN
INTRAVENOUS | Status: AC
  Filled 2020-07-03: qty 250

## 2020-07-03 MED ORDER — PHENYLEPHRINE HCL-NACL 10-0.9 MG/250ML-% IV SOLN
INTRAVENOUS | Status: DC | PRN
  Administered 2020-07-03: 25 ug/min via INTRAVENOUS

## 2020-07-03 MED ORDER — HYDROMORPHONE HCL 1 MG/ML IJ SOLN
INTRAMUSCULAR | Status: DC | PRN
  Administered 2020-07-03 (×2): .5 mg via INTRAVENOUS

## 2020-07-03 MED ORDER — MIDAZOLAM HCL 2 MG/2ML IJ SOLN
INTRAMUSCULAR | Status: AC
  Filled 2020-07-03: qty 2

## 2020-07-03 MED ORDER — HEPARIN SODIUM (PORCINE) 1000 UNIT/ML IJ SOLN
INTRAMUSCULAR | Status: AC
  Filled 2020-07-03: qty 1

## 2020-07-03 MED ORDER — LIDOCAINE HCL (PF) 1 % IJ SOLN
INTRAMUSCULAR | Status: AC
  Filled 2020-07-03: qty 30

## 2020-07-03 MED ORDER — PHENYLEPHRINE HCL (PRESSORS) 10 MG/ML IV SOLN
INTRAVENOUS | Status: AC
  Filled 2020-07-03: qty 1

## 2020-07-03 MED ORDER — SODIUM CHLORIDE 0.9 % IV SOLN: INTRAVENOUS | Status: DC | PRN

## 2020-07-03 MED ORDER — PROTAMINE SULFATE 10 MG/ML IV SOLN
INTRAVENOUS | Status: AC
  Filled 2020-07-03: qty 5

## 2020-07-03 MED ORDER — ONDANSETRON HCL 4 MG/2ML IJ SOLN
INTRAMUSCULAR | Status: AC
  Filled 2020-07-03: qty 2

## 2020-07-03 MED ORDER — PROMETHAZINE HCL 25 MG/ML IJ SOLN
INTRAMUSCULAR | Status: AC
  Administered 2020-07-03: 6.25 mg via INTRAVENOUS
  Filled 2020-07-03: qty 1

## 2020-07-03 MED ORDER — OXYCODONE-ACETAMINOPHEN 5-325 MG PO TABS
1.0000 | ORAL_TABLET | Freq: Once | ORAL | Status: AC
  Administered 2020-07-03: 1 via ORAL

## 2020-07-03 MED ORDER — ONDANSETRON HCL 4 MG/2ML IJ SOLN
INTRAMUSCULAR | Status: DC | PRN
  Administered 2020-07-03: 4 mg via INTRAVENOUS

## 2020-07-03 MED ORDER — EPHEDRINE SULFATE-NACL 50-0.9 MG/10ML-% IV SOSY
PREFILLED_SYRINGE | INTRAVENOUS | Status: DC | PRN
  Administered 2020-07-03 (×5): 10 mg via INTRAVENOUS

## 2020-07-03 MED ORDER — KETAMINE HCL 50 MG/5ML IJ SOSY
PREFILLED_SYRINGE | INTRAMUSCULAR | Status: AC
  Filled 2020-07-03: qty 5

## 2020-07-03 MED ORDER — DEXMEDETOMIDINE (PRECEDEX) IN NS 20 MCG/5ML (4 MCG/ML) IV SYRINGE
PREFILLED_SYRINGE | INTRAVENOUS | Status: AC
  Filled 2020-07-03: qty 5

## 2020-07-03 MED ORDER — CHLORHEXIDINE GLUCONATE 0.12 % MT SOLN
OROMUCOSAL | Status: AC
  Administered 2020-07-03: 15 mL
  Filled 2020-07-03: qty 15

## 2020-07-03 MED ORDER — FENTANYL CITRATE (PF) 250 MCG/5ML IJ SOLN
INTRAMUSCULAR | Status: DC | PRN
  Administered 2020-07-03: 50 ug via INTRAVENOUS
  Administered 2020-07-03 (×2): 100 ug via INTRAVENOUS

## 2020-07-03 MED ORDER — HEMOSTATIC AGENTS (NO CHARGE) OPTIME
TOPICAL | Status: DC | PRN
  Administered 2020-07-03: 1 via TOPICAL

## 2020-07-03 MED ORDER — CHLORHEXIDINE GLUCONATE 4 % EX LIQD: 60.0000 mL | Freq: Once | CUTANEOUS | Status: DC

## 2020-07-03 MED ORDER — PROPOFOL 10 MG/ML IV BOLUS
INTRAVENOUS | Status: AC
  Filled 2020-07-03: qty 40

## 2020-07-03 MED ORDER — HEPARIN SODIUM (PORCINE) 1000 UNIT/ML IJ SOLN
INTRAMUSCULAR | Status: DC | PRN
  Administered 2020-07-03: 8000 [IU] via INTRAVENOUS

## 2020-07-03 MED ORDER — PHENYLEPHRINE 40 MCG/ML (10ML) SYRINGE FOR IV PUSH (FOR BLOOD PRESSURE SUPPORT)
PREFILLED_SYRINGE | INTRAVENOUS | Status: AC
  Filled 2020-07-03: qty 10

## 2020-07-03 MED ORDER — SODIUM CHLORIDE 0.9 % IV SOLN: INTRAVENOUS | Status: DC

## 2020-07-03 MED ORDER — FENTANYL CITRATE (PF) 100 MCG/2ML IJ SOLN: 25.0000 ug | INTRAMUSCULAR | Status: DC | PRN

## 2020-07-03 MED ORDER — LIDOCAINE 2% (20 MG/ML) 5 ML SYRINGE
INTRAMUSCULAR | Status: DC | PRN
  Administered 2020-07-03: 100 mg via INTRAVENOUS

## 2020-07-03 MED ORDER — CALCIUM CHLORIDE 10 % IV SOLN
INTRAVENOUS | Status: DC | PRN
  Administered 2020-07-03 (×2): 100 mg via INTRAVENOUS
  Administered 2020-07-03 (×2): 200 mg via INTRAVENOUS

## 2020-07-03 MED ORDER — DEXMEDETOMIDINE (PRECEDEX) IN NS 20 MCG/5ML (4 MCG/ML) IV SYRINGE
PREFILLED_SYRINGE | INTRAVENOUS | Status: DC | PRN
  Administered 2020-07-03 (×3): 4 ug via INTRAVENOUS
  Administered 2020-07-03 (×2): 8 ug via INTRAVENOUS
  Administered 2020-07-03: 4 ug via INTRAVENOUS

## 2020-07-03 MED ORDER — PROPOFOL 10 MG/ML IV BOLUS
INTRAVENOUS | Status: DC | PRN
  Administered 2020-07-03: 100 mg via INTRAVENOUS
  Administered 2020-07-03: 250 mg via INTRAVENOUS

## 2020-07-03 MED ORDER — AMISULPRIDE (ANTIEMETIC) 5 MG/2ML IV SOLN: 10.0000 mg | Freq: Once | INTRAVENOUS | Status: DC

## 2020-07-03 MED ORDER — HYDROMORPHONE HCL 1 MG/ML IJ SOLN
INTRAMUSCULAR | Status: AC
  Filled 2020-07-03: qty 0.5

## 2020-07-03 MED ORDER — AMISULPRIDE (ANTIEMETIC) 5 MG/2ML IV SOLN: 10.0000 mg | Freq: Once | INTRAVENOUS | Status: DC | PRN

## 2020-07-03 MED ORDER — MIDAZOLAM HCL 5 MG/5ML IJ SOLN
INTRAMUSCULAR | Status: DC | PRN
  Administered 2020-07-03: 2 mg via INTRAVENOUS

## 2020-07-03 MED ORDER — FENTANYL CITRATE (PF) 250 MCG/5ML IJ SOLN
INTRAMUSCULAR | Status: AC
  Filled 2020-07-03: qty 5

## 2020-07-03 MED ORDER — LIDOCAINE 2% (20 MG/ML) 5 ML SYRINGE
INTRAMUSCULAR | Status: AC
  Filled 2020-07-03: qty 5

## 2020-07-03 MED ORDER — OXYCODONE-ACETAMINOPHEN 5-325 MG PO TABS: 1.0000 | ORAL_TABLET | ORAL | 0 refills | Status: DC | PRN

## 2020-07-03 MED ORDER — 0.9 % SODIUM CHLORIDE (POUR BTL) OPTIME
TOPICAL | Status: DC | PRN
  Administered 2020-07-03: 1000 mL

## 2020-07-03 MED ORDER — SODIUM CHLORIDE 0.9 % IV SOLN
INTRAVENOUS | Status: AC
  Filled 2020-07-03: qty 1.2

## 2020-07-03 MED ORDER — PHENYLEPHRINE 40 MCG/ML (10ML) SYRINGE FOR IV PUSH (FOR BLOOD PRESSURE SUPPORT)
PREFILLED_SYRINGE | INTRAVENOUS | Status: DC | PRN
  Administered 2020-07-03 (×2): 80 ug via INTRAVENOUS
  Administered 2020-07-03: 160 ug via INTRAVENOUS
  Administered 2020-07-03: 80 ug via INTRAVENOUS

## 2020-07-03 MED ORDER — PROMETHAZINE HCL 25 MG/ML IJ SOLN: 6.2500 mg | INTRAMUSCULAR | Status: DC | PRN

## 2020-07-03 MED ORDER — OXYCODONE-ACETAMINOPHEN 5-325 MG PO TABS
ORAL_TABLET | ORAL | Status: AC
  Filled 2020-07-03: qty 1

## 2020-07-03 MED ORDER — CALCIUM CHLORIDE 10 % IV SOLN
INTRAVENOUS | Status: AC
  Filled 2020-07-03: qty 10

## 2020-07-03 SURGICAL SUPPLY — 40 items
ARMBAND PINK RESTRICT EXTREMIT (MISCELLANEOUS) ×2 IMPLANT
BLADE CLIPPER SURG (BLADE) ×2 IMPLANT
CANISTER SUCT 3000ML PPV (MISCELLANEOUS) ×2 IMPLANT
CLIP VESOCCLUDE MED 6/CT (CLIP) ×2 IMPLANT
CLIP VESOCCLUDE SM WIDE 24/CT (CLIP) ×2 IMPLANT
CLIP VESOCCLUDE SM WIDE 6/CT (CLIP) ×2 IMPLANT
COVER PROBE W GEL 5X96 (DRAPES) ×2 IMPLANT
COVER WAND RF STERILE (DRAPES) ×2 IMPLANT
DECANTER SPIKE VIAL GLASS SM (MISCELLANEOUS) ×2 IMPLANT
DERMABOND ADVANCED (GAUZE/BANDAGES/DRESSINGS) ×1
DERMABOND ADVANCED .7 DNX12 (GAUZE/BANDAGES/DRESSINGS) ×1 IMPLANT
ELECT REM PT RETURN 9FT ADLT (ELECTROSURGICAL) ×2
ELECTRODE REM PT RTRN 9FT ADLT (ELECTROSURGICAL) ×1 IMPLANT
GLOVE BIO SURGEON STRL SZ7.5 (GLOVE) ×2 IMPLANT
GLOVE SRG 8 PF TXTR STRL LF DI (GLOVE) ×1 IMPLANT
GLOVE SURG UNDER POLY LF SZ6.5 (GLOVE) ×6 IMPLANT
GLOVE SURG UNDER POLY LF SZ7 (GLOVE) ×6 IMPLANT
GLOVE SURG UNDER POLY LF SZ8 (GLOVE) ×1
GOWN STRL REUS W/ TWL LRG LVL3 (GOWN DISPOSABLE) ×3 IMPLANT
GOWN STRL REUS W/ TWL XL LVL3 (GOWN DISPOSABLE) ×1 IMPLANT
GOWN STRL REUS W/TWL LRG LVL3 (GOWN DISPOSABLE) ×3
GOWN STRL REUS W/TWL XL LVL3 (GOWN DISPOSABLE) ×1
GRAFT GORETEX STRT 4-7X45 (Vascular Products) ×2 IMPLANT
HEMOSTAT SPONGE AVITENE ULTRA (HEMOSTASIS) IMPLANT
KIT BASIN OR (CUSTOM PROCEDURE TRAY) ×2 IMPLANT
KIT TURNOVER KIT B (KITS) ×2 IMPLANT
NS IRRIG 1000ML POUR BTL (IV SOLUTION) ×2 IMPLANT
PACK CV ACCESS (CUSTOM PROCEDURE TRAY) ×2 IMPLANT
PAD ARMBOARD 7.5X6 YLW CONV (MISCELLANEOUS) ×4 IMPLANT
SUT GORETEX 6.0 TT13 (SUTURE) IMPLANT
SUT MNCRL AB 4-0 PS2 18 (SUTURE) ×4 IMPLANT
SUT PROLENE 6 0 BV (SUTURE) ×14 IMPLANT
SUT PROLENE 7 0 BV 1 (SUTURE) IMPLANT
SUT SILK 2 0 PERMA HAND 18 BK (SUTURE) IMPLANT
SUT SILK 3 0 SH CR/8 (SUTURE) ×2 IMPLANT
SUT VIC AB 3-0 SH 27 (SUTURE) ×2
SUT VIC AB 3-0 SH 27X BRD (SUTURE) ×2 IMPLANT
TOWEL GREEN STERILE (TOWEL DISPOSABLE) ×2 IMPLANT
UNDERPAD 30X36 HEAVY ABSORB (UNDERPADS AND DIAPERS) ×2 IMPLANT
WATER STERILE IRR 1000ML POUR (IV SOLUTION) ×2 IMPLANT

## 2020-07-03 NOTE — Transfer of Care (Signed)
Immediate Anesthesia Transfer of Care Note  Patient: Paul [name]  Procedure(s) Performed: INSERTION OF ARTERIOVENOUS (AV) GORE-TEX GRAFT RIGHT UPPER ARM (Right Arm Upper)  Patient Location: PACU  Anesthesia Type:General  Level of Consciousness: awake, alert , oriented and patient cooperative  Airway & Oxygen Therapy: Patient Spontanous Breathing and Patient connected to face mask oxygen  Post-op Assessment: Report given to RN, Post -op Vital signs reviewed and stable and Patient moving all extremities X 4  Post vital signs: Reviewed and stable  Last Vitals:  Vitals Value Taken Time  BP 111/87 07/03/20 1011  Temp    Pulse 115 07/03/20 1014  Resp 22 07/03/20 1014  SpO2 100 % 07/03/20 1014  Vitals shown include unvalidated device data.  Last Pain:  Vitals:   07/03/20 0631  TempSrc:   PainSc: 0-No pain      Patients Stated Pain Goal: 3 ([id number])  Complications: No complications documented.

## 2020-07-03 NOTE — Anesthesia Postprocedure Evaluation (Signed)
Anesthesia Post Note  Patient: Paul [name]  Procedure(s) Performed: INSERTION OF ARTERIOVENOUS (AV) GORE-TEX GRAFT RIGHT UPPER ARM (Right Arm Upper)     Patient location during evaluation: PACU Anesthesia Type: General Level of consciousness: sedated and patient cooperative Pain management: pain level controlled Vital Signs Assessment: post-procedure vital signs reviewed and stable Respiratory status: spontaneous breathing Cardiovascular status: stable Anesthetic complications: no   No complications documented.  Last Vitals:  Vitals:   07/03/20 1040 07/03/20 1055  BP: 116/77 104/70  Pulse: (!) 101 100  Resp: 15 20  Temp:  (!) 36.4 C  SpO2: 96% 99%    Last Pain:  Vitals:   07/03/20 1120  TempSrc:   PainSc: [place]

## 2020-07-03 NOTE — Op Note (Signed)
OPERATIVE NOTE   PROCEDURE:  right upper arm arteriovenous graft (4 mm x 7 mm GoreTex graft)   PRE-OPERATIVE DIAGNOSIS: end stage renal disease   POST-OPERATIVE DIAGNOSIS: same  SURGEON: Marty Heck, MD  ASSISTANT(S): OR staff  ANESTHESIA: LMA  ESTIMATED BLOOD LOSS: 100 mL  FINDING(S): I could not appreciate a brachial pulse until I cut down on the artery.  The artery was fairly small.  He had a very large arm given morbid obesity.  Ultimately a 4 mm x 7 mm tapered AV graft was sewn from the brachial artery just above antecubitum and tunneled up to the axilla.  There was a weak thrill at completion.  He did have Doppler flow at the radial artery after the graft was completed.  I could not appreciate a radial pulse prior to the case.  SPECIMEN(S): None  INDICATIONS:   Meguel [name] is a 30 y.o. male who presents with ESRD and need for permanent hemodialysis access.  Risk, benefits, and alternatives to access surgery were discussed.  The patient is aware the risks include but are not limited to: bleeding, infection, steal syndrome, nerve damage, ischemic monomelic neuropathy, failure to mature, and need for additional procedures.  The patient is aware of the risks and elects to proceed forward.  DESCRIPTION: After full informed written consent was obtained from the patient, the patient was brought back to the operating room and placed supine upon the operating table.  The patient was given IV antibiotics prior to proceeding.  After obtaining adequate sedation, the patient was prepped and draped in standard fashion for a left arm access procedure.  I evaluated all of the surface veins with ultrasound and they were small as indicated on pre-operative vein mapping.  I turned my attention first to the antecubitum.  Under ultrasound guidance, I identified the location of the brachial artery and marked it on the skin.  I then looked on the upper arm near the axilla and marked the  brachial vein as well as axillary vein. I made a longitudinal incision over the brachial artery above the antecubitum and another longitudinal incision over the brachial/axillary vein.  I dissected down through the subcutaneous tissue and  fascia carefully and was able to dissect out the brachial artery.  The artery was about 3.5 mm externally.  It was controlled proximally and distally with vessel loops.  I then dissected out the larger brachial vein through the upper arm incision and dissected it onto the axillary vein.  Externally, it appeared to be 4 mm in diameter.  I then dissected this vein proximally and distal.  II took a metal Gore tunneler and dissected from the antecubital incision to the axillary incision.  Then I delivered the 4 x 7-mm stretch Gore-Tex graft, through this metal tunneler and then pulled out the metal tunneler leaving the graft in place.  The 4-mm end was left on the brachial artery side and the 7-mm end toward the vein side.  I then gave the patient 8,000 units of IV  heparin to gain anticoagulation.  After waiting 2 minutes, I placed the brachial artery under tension proximally and distally with vessel loops, made an arteriotomy and extended it with a Potts scissor.  I sewed the 4-mm end of the graft to this arteriotomy with a running stitch of 6-0 Prolene.  At this point, then I completed the anastomosis in the usual fashion.  I released the vessel loops on the inflow and allowed the artery to  decompress through the graft. There was good pulsatile bleeding through this graft.  I clamped the graft near its arterial anastomosis and sucked out all the blood in the graft and loaded the graft with heparinized saline.  At this point, I pulled the graft to appropriate length and reset my exposure on the high brachial onto the axillary vein. The vein was controlled with Henle clamps and opened with 11 blade scalpel and extended with Potts scissors.  There was good venous backbleeding from the  vein. I was able to pass a #5 dilator without resistance. I spatulated the graft to facilitate an end-to-side anastomosis.  In the process of spatulating, I cut the graft to appropriate length for this anastomosis.  This graft was sewn to the vein in an end-to-side configuration with a 6-0 Prolene.  Prior to completing this anastomosis, I allowed the vein to back bleed and then I also allowed the artery to bleed in an antegrade fashion.  I completed this anastomosis in the usual fashion.  I irrigated out both incisions.  I used surgicel snow to get hemostasis.   The graft had a weak palpable thrill.  The radial and ulnar artery has good signals.  The subcutaneous tissue in each incision was reapproximated with a running stitch of 3-0 Vicryl.  The skin was then reapproximated with a running subcuticular 4-0 Monocryl.  The skin was then cleaned, dried, and Dermabond used to reinforce the skin closure.   COMPLICATIONS: None  CONDITION: Stable   Marty Heck, MD [facility] Office: [place]    07/03/2020, 9:44 AM

## 2020-07-03 NOTE — Anesthesia Procedure Notes (Signed)
Procedure Name: LMA Insertion Date/Time: 07/03/2020 7:44 AM Performed by: Hendricks Limes, CRNA Pre-anesthesia Checklist: Patient identified, Emergency Drugs available, Suction available and Patient being monitored Patient Re-evaluated:Patient Re-evaluated prior to induction Oxygen Delivery Method: Circle system utilized Preoxygenation: Pre-oxygenation with 100% oxygen Induction Type: IV induction Ventilation: Mask ventilation without difficulty LMA: LMA inserted LMA Size: 5.0 Number of attempts: 1 Airway Equipment and Method: Patient positioned with wedge pillow Placement Confirmation: positive ETCO2 and breath sounds checked- equal and bilateral Tube secured with: Tape Dental Injury: Teeth and Oropharynx as per pre-operative assessment

## 2020-07-03 NOTE — Discharge Instructions (Signed)
[facility]  Discharge Instructions  AV Fistula or Graft Surgery for Dialysis Access  Please refer to the following instructions for your post-procedure care. Your surgeon or physician assistant will discuss any changes with you.  Activity  You may drive the day following your surgery, if you are comfortable and no longer taking prescription pain medication. Resume full activity as the soreness in your incision resolves.  Bathing/Showering  You may shower after you go home. Keep your incision dry for 48 hours. Do not soak in a bathtub, hot tub, or swim until the incision heals completely. You may not shower if you have a hemodialysis catheter.  Incision Care  Clean your incision with mild soap and water after 48 hours. Pat the area dry with a clean towel. You do not need a bandage unless otherwise instructed. Do not apply any ointments or creams to your incision. You may have skin glue on your incision. Do not peel it off. It will come off on its own in about one week. Your arm may swell a bit after surgery. To reduce swelling use pillows to elevate your arm so it is above your heart. Your doctor will tell you if you need to lightly wrap your arm with an ACE bandage.  Diet  Resume your normal diet. There are not special food restrictions following this procedure. In order to heal from your surgery, it is CRITICAL to get adequate nutrition. Your body requires vitamins, minerals, and protein. Vegetables are the best source of vitamins and minerals. Vegetables also provide the perfect balance of protein. Processed food has little nutritional value, so try to avoid this.  Medications  Resume taking all of your medications. If your incision is causing pain, you may take over-the counter pain relievers such as acetaminophen (Tylenol). If you were prescribed a stronger pain medication, please be aware these medications can cause nausea and constipation. Prevent  nausea by taking the medication with a snack or meal. Avoid constipation by drinking plenty of fluids and eating foods with high amount of fiber, such as fruits, vegetables, and grains.  Do not take Tylenol if you are taking prescription pain medications.  Follow up Your surgeon may want to see you in the office following your access surgery. If so, this will be arranged at the time of your surgery.  Please call us immediately for any of the following conditions:  . Increased pain, redness, drainage (pus) from your incision site . Fever of 101 degrees or higher . Severe or worsening pain at your incision site . Hand pain or numbness. .  Reduce your risk of vascular disease:  . Stop smoking. If you would like help, call [name] at 1-800-QUIT-NOW (867)166-4161) or [name] at 941-305-6305  . Manage your cholesterol . Maintain a desired weight . Control your diabetes . Keep your blood pressure down  Dialysis  It will take several weeks to several months for your new dialysis access to be ready for use. Your surgeon will determine when it is okay to use it. Your nephrologist will continue to direct your dialysis. You can continue to use your Permcath until your new access is ready for use.   07/03/2020 Paul [name] [id number] 10-04-1990  Surgeon(s): Paul Heck, MD  Procedure(s): INSERTION OF ARTERIOVENOUS (AV) GORE-TEX GRAFT RIGHT UPPER ARM   May stick graft immediately   May stick graft on designated area only:   x Do not stick AV graft for 4 weeks  If you have any questions, please call the office at [phone].

## 2020-07-03 NOTE — H&P (Signed)
History and Physical Interval Note:  07/03/2020 7:30 AM  [name]  has presented today for surgery, with the diagnosis of ESRD.  The various methods of treatment have been discussed with the patient and family. After consideration of risks, benefits and other options for treatment, the patient has consented to  Procedure(s): INSERTION OF ARTERIOVENOUS (AV) GORE-TEX GRAFT RIGHT UPPER ARM (Right) as a surgical intervention.  The patient's history has been reviewed, patient examined, no change in status, stable for surgery.  I have reviewed the patient's chart and labs.  Questions were answered to the patient's satisfaction.    Right arm AVG  Marty Heck  POST OPERATIVE OFFICE NOTE    CC:  F/u for surgery  HPI:  This is a 30 y.o. male who is s/p right 1st stage BVT on 05/06/2020 by Dr. Trula Slade.     Pt has hx of left brachiocephalic fistula on 10/29/1155 by Dr. Oneida Alar. He then had a left upper arm loop graft by Dr. Donnetta Hutching on 12/02/2016. In 2019 he presented with active bleeding from his graft which required revision by Dr. Carlis Abbott.  The graft no longer had a thrill and that is when he underwent 1st stage right BVT.   He presents today for follow up.  Pt states he does not have pain/numbness in right hand.  He is currently dialyzing via tunneled dialysis catheter that was placed about 4 months ago at [facility].  He states it is working well.   The pt is on dialysis T/T/S at [facility] location.        Allergies  Allergen Reactions  . Hydralazine Hcl Other (See Comments)    DRUG-INDUCED LUPUS          Current Outpatient Medications  Medication Sig Dispense Refill  . Accu-Chek FastClix Lancets MISC USE AS DIRECTED ONCE DAILY 102 each 0  . acetaminophen (TYLENOL) 500 MG tablet Take 1,000 mg by mouth every 6 (six) hours as needed (for pain.).    Marland Kitchen amLODipine (NORVASC) 5 MG tablet Take 5 mg on nondialysis days. (Patient taking differently: Take 5 mg by mouth  every Monday, Wednesday, Friday, Saturday, and Sunday at 6 PM. Take 5 mg on nondialysis days.) 90 tablet 2  . Blood Glucose Monitoring Suppl (ACCU-CHEK GUIDE ME) w/Device KIT 1 kit by Does not apply route daily. 1 kit 0  . cinacalcet (SENSIPAR) 60 MG tablet Take 60 mg by mouth every evening.     . ferric citrate (AURYXIA) 1 GM 210 MG(Fe) tablet Take 420-840 mg by mouth See admin instructions. Take 4 tablets (840 mg) by mouth with each meals & take 2-3 tablets (420 mg-630 mg) by mouth with each snack.    Marland Kitchen glucose blood (ACCU-CHEK GUIDE) test strip 1 each by Other route 2 (two) times daily. Use as instructed 100 each 12  . HYDROcodone-acetaminophen (NORCO) 5-325 MG tablet Take 1 tablet by mouth every 6 (six) hours as needed for moderate pain. 10 tablet 0  . insulin glargine (LANTUS) 100 UNIT/ML injection INJECT 0.1-0.15 MLS (10-15 UNITS) INTO THE SKIN 2 TIMES DAILY. PATIENT NEEDS APPOINTMENT 10 mL 0  . levothyroxine (SYNTHROID) 75 MCG tablet Take 1 tablet (75 mcg total) by mouth daily before breakfast. 30 tablet 0  . multivitamin (RENA-VIT) TABS tablet Take 1 tablet by mouth at bedtime. 30 tablet 0  . rosuvastatin (CRESTOR) 20 MG tablet Take 2 tablets (40 mg total) by mouth daily. 90 tablet 0  . Syringe, Disposable, (B-D SYRINGE SLIP TIP  30CC) 30 ML MISC 1 Syringe by Does not apply route daily. 30 each 11   No current facility-administered medications for this visit.     ROS:  See HPI  Physical Exam:     Today's Vitals   06/10/20 1444  BP: 112/78  Pulse: (!) 102  Resp: 20  Temp: 98.5 F (36.9 C)  TempSrc: Temporal  SpO2: 97%  Weight: (!) 314 lb 4.8 oz (142.6 kg)  Height: 6' 1"  (1.854 m)   Body mass index is 41.47 kg/m.   Incision:  Welled healed with 10m superficial dehiscence  Extremities:   There is a palpable right radial pulse.   Motor and sensory are in tact.   There is not a thrill/bruit present.    Dialysis Duplex on 06/10/2020: Anastomosis appears  occluded.    Assessment/Plan:  This is a 30y.o. male who is s/p: Right 1st stage BVT on 05/06/2020 by Dr. BTrula Slade -the pt does not have evidence of steal.  His fistula is occluded.   -discussed with Dr. FOneida Alarand will set pt up for central venogram on a M/W/F given early failure of fistula.  If no central venous stenosis, can plan for right upper arm graft in the future.   -discussed with pt and his sister and questions are answered.     SLeontine Locket PPione[facility] LLC[facility] [phone] Clinic MD:  DScot Dock

## 2020-07-06 ENCOUNTER — Encounter (HOSPITAL_COMMUNITY): Payer: Self-pay | Admitting: Vascular Surgery

## 2020-07-08 ENCOUNTER — Telehealth: Payer: Self-pay

## 2020-07-08 NOTE — Telephone Encounter (Signed)
Received VM from [facility] to ask when AVG could be cannulated. It was placed on 2/11. Confirmed time with PA and called back and advised on VM that the earliest AVG may be accessed is 4 weeks out and that date would be 07/31/20.

## 2020-07-09 DIAGNOSIS — E8770 Fluid overload, unspecified: Secondary | ICD-10-CM | POA: Insufficient documentation

## 2020-07-10 ENCOUNTER — Other Ambulatory Visit: Payer: Self-pay | Admitting: Family Medicine

## 2020-07-10 DIAGNOSIS — E1129 Type 2 diabetes mellitus with other diabetic kidney complication: Secondary | ICD-10-CM

## 2020-07-10 DIAGNOSIS — Z794 Long term (current) use of insulin: Secondary | ICD-10-CM

## 2020-07-13 ENCOUNTER — Telehealth: Payer: Self-pay

## 2020-07-13 NOTE — Telephone Encounter (Signed)
Pt called with c/o AVG placed 07/03/20 having pain and numbness that has been ongoing. He has not had them look at it at [facility]. Pt feels swelling has improved and he is elevating arm. He wants to be seen in office by MD who placed graft, however, I have made him aware his MD is not in clinic this week and he can see APP. He wants to see APP and appt has been made for this week and he verbalized understanding.

## 2020-07-15 ENCOUNTER — Other Ambulatory Visit: Payer: Self-pay

## 2020-07-15 ENCOUNTER — Ambulatory Visit (INDEPENDENT_AMBULATORY_CARE_PROVIDER_SITE_OTHER): Payer: Medicaid Other | Admitting: Physician Assistant

## 2020-07-15 VITALS — BP 102/69 | HR 99 | Temp 98.6°F | Resp 20 | Ht 74.0 in | Wt 322.7 lb

## 2020-07-15 DIAGNOSIS — N186 End stage renal disease: Secondary | ICD-10-CM

## 2020-07-15 DIAGNOSIS — Z992 Dependence on renal dialysis: Secondary | ICD-10-CM

## 2020-07-15 NOTE — Progress Notes (Signed)
POST OPERATIVE DIALYSIS ACCESS OFFICE NOTE    CC:  F/u for dialysis access surgery; 12 day post-op  HPI:  This is a 30 y.o. male who is s/p right upper arm AV graft with PTFE on 07/03/2020 by Dr. Carlis Abbott.  He had a previous first stage basilic vein fistula that occluded.  Today, he complains of upper arm edema but denies hand pain or numbness.  He states his edema is improving.  He is dialyzing via right IJ tunneled dialysis catheter.  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  He is attempting weight loss and has lost approximately 50 pounds in an attempt to be accepted on renal transplant waiting list.  Patient sister accompanies him today.  Dialysis days:  MWF Dialysis center: [facility]  Allergies  Allergen Reactions  . Hydralazine Hcl Other (See Comments)    DRUG-INDUCED LUPUS    Current Outpatient Medications  Medication Sig Dispense Refill  . Accu-Chek FastClix Lancets MISC USE AS DIRECTED ONCE DAILY 102 each 0  . acetaminophen (TYLENOL) 500 MG tablet Take 1,000 mg by mouth every 6 (six) hours as needed (for pain.).    Marland Kitchen amLODipine (NORVASC) 5 MG tablet Take 5 mg on nondialysis days. (Patient taking differently: Take 5 mg by mouth every Monday, Wednesday, Friday, Saturday, and Sunday at 6 PM. Take 5 mg on nondialysis days.) 90 tablet 2  . Blood Glucose Monitoring Suppl (ACCU-CHEK GUIDE ME) w/Device KIT 1 kit by Does not apply route daily. 1 kit 0  . cinacalcet (SENSIPAR) 30 MG tablet Take 30 mg by mouth every evening.    . ferric citrate (AURYXIA) 1 GM 210 MG(Fe) tablet Take 420-840 mg by mouth See admin instructions. Take 4 tablets (840 mg) by mouth with each meals & take 2-3 tablets (420 mg-630 mg) by mouth with each snack.    Marland Kitchen glucose blood (ACCU-CHEK GUIDE) test strip 1 each by Other route 2 (two) times daily. Use as instructed 100 each 12  .  insulin glargine (LANTUS) 100 UNIT/ML injection INJECT 10 TO  15 UNITS INTO THE SKIN TWICE DAILY. PATIENT NEEDS APPOINTMENT 10 mL 0  . levothyroxine (SYNTHROID) 75 MCG tablet Take 1 tablet (75 mcg total) by mouth daily before breakfast. 30 tablet 0  . multivitamin (RENA-VIT) TABS tablet Take 1 tablet by mouth at bedtime. (Patient taking differently: Take 1 tablet by mouth every evening.) 30 tablet 0  . oxyCODONE-acetaminophen (PERCOCET) 5-325 MG tablet Take 1 tablet by mouth every 4 (four) hours as needed for severe pain. 20 tablet 0  . rosuvastatin (CRESTOR) 20 MG tablet Take 2 tablets (40 mg total) by mouth daily. (Patient taking differently: Take 20 mg by mouth every evening.) 90 tablet 0  . Syringe, Disposable, (B-D SYRINGE SLIP TIP 30CC) 30 ML MISC 1 Syringe by Does not apply route daily. 30 each 11   No current facility-administered medications for this visit.     ROS:  See HPI  There were no vitals taken for this visit.   Physical Exam:  General appearance: Well-developed well-nourished in no apparent distress Cardiac: rate and rhythm are regular Respiratory: Nonlabored Incision: Right AC and axillary incisions are healing nicely without signs of infection Extremities: Graft is easily palpated in the right upper arm.  The bruit is difficult to auscultate, however it is audible with hand-held Doppler.  There is mild right upper arm edema.  The right radial pulse is 2+.  The hand grip strength is 5/5.  Sensation is intact.  Right hand is warm without pallor.   Assessment/Plan:   -pt does not have evidence of steal syndrome -the fistula/graft may be used starting on approximately Jul 31, 2020  Barbie Banner, PA-C 07/15/2020 9:12 AM [facility] [phone]  Clinic MD: Scot Dock

## 2020-07-16 ENCOUNTER — Telehealth: Payer: Self-pay

## 2020-07-16 NOTE — Telephone Encounter (Signed)
Call from [place], [place] with [facility] left voicemail asking for [place] to get MD to advise on what next step for pt would be. She saw pt today and did not hear bruit and is wanting to know if MD would like to schedule fistulogram. I left a vm for [place] to let her know we will contact her once we hear back from MD.

## 2020-07-16 NOTE — Telephone Encounter (Signed)
Pt has been scheduled to return to office for MD visit next week. Pt is aware and verbalized understanding of this appt. PA nephrology is also aware.

## 2020-07-20 DIAGNOSIS — E1129 Type 2 diabetes mellitus with other diabetic kidney complication: Secondary | ICD-10-CM | POA: Diagnosis not present

## 2020-07-20 DIAGNOSIS — Z992 Dependence on renal dialysis: Secondary | ICD-10-CM | POA: Diagnosis not present

## 2020-07-20 DIAGNOSIS — N186 End stage renal disease: Secondary | ICD-10-CM | POA: Diagnosis not present

## 2020-07-21 ENCOUNTER — Encounter: Payer: Self-pay | Admitting: Vascular Surgery

## 2020-07-21 ENCOUNTER — Ambulatory Visit (HOSPITAL_COMMUNITY)
Admission: RE | Admit: 2020-07-21 | Discharge: 2020-07-21 | Disposition: A | Discharge status: Home / Self Care | Payer: Medicaid Other | Source: Ambulatory Visit | Attending: Family Medicine | Admitting: Family Medicine

## 2020-07-21 ENCOUNTER — Other Ambulatory Visit (HOSPITAL_COMMUNITY): Payer: Self-pay | Admitting: Vascular Surgery

## 2020-07-21 ENCOUNTER — Ambulatory Visit (INDEPENDENT_AMBULATORY_CARE_PROVIDER_SITE_OTHER): Payer: Medicaid Other | Admitting: Vascular Surgery

## 2020-07-21 ENCOUNTER — Other Ambulatory Visit: Payer: Self-pay

## 2020-07-21 VITALS — BP 117/84 | HR 101 | Temp 98.3°F | Resp 16 | Ht 73.0 in | Wt 322.0 lb

## 2020-07-21 DIAGNOSIS — R52 Pain, unspecified: Secondary | ICD-10-CM | POA: Insufficient documentation

## 2020-07-21 DIAGNOSIS — Z992 Dependence on renal dialysis: Secondary | ICD-10-CM

## 2020-07-21 DIAGNOSIS — N186 End stage renal disease: Secondary | ICD-10-CM

## 2020-07-21 NOTE — Progress Notes (Signed)
Patient name: Paul [name] MRN: [id number] DOB: [birth year] Sex: male  REASON FOR VISIT: Possibly occluded right upper arm AV graft  HPI: Paul [name] is a 30 y.o. male with end-stage renal disease on dialysis Tuesday Thursday Friday that presents for evaluation of his right upper arm AV graft that was recently placed with concern for occlusion.  His graft was placed on 07/03/2020 and he was seen last week by our PA with a patent graft.  Ultimately this was felt to be occluded on Friday when he saw one of the PAs with nephrology.  He has a complex history including a left brachiocephalic in 1191 by Dr. Oneida Alar and then later a left upper extremity loop graft in 2018 by Dr. Donnetta Hutching that required thrombectomy and declots including by myself.  Ultimately this eventually failed and he had a right first stage basilic vein with Dr. Trula Slade and failed and then recently an upper arm graft by myself on 07/03/20.  Past Medical History:  Diagnosis Date  . Acute hypoxemic respiratory failure ([place]) 09/11/2016  . Anemia   . Chronic kidney disease    ARF on CRF Dialysis T/TH/Sa  . Diabetes mellitus    Type II  . End stage renal disease on dialysis [facility])    [facility]   . GERD (gastroesophageal reflux disease)   . Hypertension   . Hypothyroidism   . Morbid obesity ([place])   . Sacral wound   . Thyroid disease     Past Surgical History:  Procedure Laterality Date  . AV FISTULA PLACEMENT Left 09/26/2016   Procedure: LEFT UPPER ARM ARTERIOVENOUS (AV) FISTULA CREATION;  Surgeon: Elam Dutch, MD;  Location: [facility] OR;  Service: Vascular;  Laterality: Left;  . AV FISTULA PLACEMENT Left 12/02/2016   Procedure: INSERTION OF ARTERIOVENOUS GORE-TEX GRAFT LEFT UPPER  ARM USING A 4-7MM BY 45CM GRAFT ;  Surgeon: Rosetta Posner, MD;  Location: [facility] OR;  Service: Vascular;  Laterality: Left;  . AV FISTULA PLACEMENT Right 07/03/2020   Procedure: INSERTION OF ARTERIOVENOUS (AV) GORE-TEX GRAFT RIGHT UPPER ARM;  Surgeon:  Marty Heck, MD;  Location: [place];  Service: Vascular;  Laterality: Right;  . BASCILIC VEIN TRANSPOSITION Right 05/06/2020   Procedure: FIRST STAGE RIGHT BASCILIC VEIN TRANSPOSITION;  Surgeon: Serafina Mitchell, MD;  Location: [place];  Service: Vascular;  Laterality: Right;  . EXCHANGE OF A DIALYSIS CATHETER Right 10/08/2016   Procedure: EXCHANGE OF A DIALYSIS CATHETER-RIGHT INTERNAL JUGULAR;  Surgeon: Conrad [place], MD;  Location: [place];  Service: Vascular;  Laterality: Right;  . INSERTION OF DIALYSIS CATHETER Right 09/26/2016   Procedure: INSERTION OF DIALYSIS CATHETER - Right Internal Jugular Placement;  Surgeon: Elam Dutch, MD;  Location: [facility] OR;  Service: Vascular;  Laterality: Right;  . INSERTION OF DIALYSIS CATHETER Left 03/22/2018   Procedure: INSERTION OF DIALYSIS CATHETER;  Surgeon: Marty Heck, MD;  Location: [place];  Service: Vascular;  Laterality: Left;  . REVISON OF ARTERIOVENOUS FISTULA Left 03/22/2018   Procedure: LEFT UPPER EXTREMITY ARTERIOVENOUS  REVISION WITH GORE-TEX GRAFT.;  Surgeon: Marty Heck, MD;  Location: [place];  Service: Vascular;  Laterality: Left;  . THROMBECTOMY Left 08/26/2019   thrombectomy of LUA loop AVG  . VISCERAL ANGIOGRAPHY Right 06/17/2020   Procedure: CENTRAL VENO;  Surgeon: Cherre Robins, MD;  Location: [place] CV LAB;  Service: Cardiovascular;  Laterality: Right;    Family History  Problem Relation Age of Onset  . Heart disease Mother   .  Hypertension Mother     SOCIAL HISTORY: Social History   Tobacco Use  . Smoking status: Never Smoker  . Smokeless tobacco: Never Used  Substance Use Topics  . Alcohol use: No    Allergies  Allergen Reactions  . Hydralazine Hcl Other (See Comments)    DRUG-INDUCED LUPUS    Current Outpatient Medications  Medication Sig Dispense Refill  . Accu-Chek FastClix Lancets MISC USE AS DIRECTED ONCE DAILY 102 each 0  . acetaminophen (TYLENOL) 500 MG tablet Take 1,000 mg by  mouth every 6 (six) hours as needed (for pain.).    Marland Kitchen amLODipine (NORVASC) 5 MG tablet Take 5 mg on nondialysis days. (Patient taking differently: Take 5 mg by mouth every Monday, Wednesday, Friday, Saturday, and Sunday at 6 PM. Take 5 mg on nondialysis days.) 90 tablet 2  . Blood Glucose Monitoring Suppl (ACCU-CHEK GUIDE ME) w/Device KIT 1 kit by Does not apply route daily. 1 kit 0  . cinacalcet (SENSIPAR) 30 MG tablet Take 30 mg by mouth every evening.    . ferric citrate (AURYXIA) 1 GM 210 MG(Fe) tablet Take 420-840 mg by mouth See admin instructions. Take 4 tablets (840 mg) by mouth with each meals & take 2-3 tablets (420 mg-630 mg) by mouth with each snack.    Marland Kitchen glucose blood (ACCU-CHEK GUIDE) test strip 1 each by Other route 2 (two) times daily. Use as instructed 100 each 12  . insulin glargine (LANTUS) 100 UNIT/ML injection INJECT 10 TO  15 UNITS INTO THE SKIN TWICE DAILY. PATIENT NEEDS APPOINTMENT 10 mL 0  . levothyroxine (SYNTHROID) 75 MCG tablet Take 1 tablet (75 mcg total) by mouth daily before breakfast. 30 tablet 0  . multivitamin (RENA-VIT) TABS tablet Take 1 tablet by mouth at bedtime. (Patient taking differently: Take 1 tablet by mouth every evening.) 30 tablet 0  . oxyCODONE-acetaminophen (PERCOCET) 5-325 MG tablet Take 1 tablet by mouth every 4 (four) hours as needed for severe pain. 20 tablet 0  . rosuvastatin (CRESTOR) 20 MG tablet Take 2 tablets (40 mg total) by mouth daily. (Patient taking differently: Take 20 mg by mouth every evening.) 90 tablet 0  . Syringe, Disposable, (B-D SYRINGE SLIP TIP 30CC) 30 ML MISC 1 Syringe by Does not apply route daily. 30 each 11   No current facility-administered medications for this visit.    REVIEW OF SYSTEMS:  [X]  denotes positive finding, [ ]  denotes negative finding Cardiac  Comments:  Chest pain or chest pressure:    Shortness of breath upon exertion:    Short of breath when lying flat:    Irregular heart rhythm:        Vascular     Pain in calf, thigh, or hip brought on by ambulation:    Pain in feet at night that wakes you up from your sleep:     Blood clot in your veins:    Leg swelling:         Pulmonary    Oxygen at home:    Productive cough:     Wheezing:         Neurologic    Sudden weakness in arms or legs:     Sudden numbness in arms or legs:     Sudden onset of difficulty speaking or slurred speech:    Temporary loss of vision in one eye:     Problems with dizziness:         Gastrointestinal    Blood in stool:  Vomited blood:         Genitourinary    Burning when urinating:     Blood in urine:        Psychiatric    Major depression:         Hematologic    Bleeding problems:    Problems with blood clotting too easily:        Skin    Rashes or ulcers:        Constitutional    Fever or chills:      PHYSICAL EXAM: Vitals:   07/21/20 1534  BP: 117/84  Pulse: (!) 101  Resp: 16  Temp: 98.3 F (36.8 C)  TempSrc: Temporal  SpO2: 97%  Weight: (!) 322 lb (146.1 kg)  Height: 6' 1"  (1.854 m)    GENERAL: The patient is a well-nourished male, in no acute distress. The vital signs are documented above. CARDIAC: There is a regular rate and rhythm.  VASCULAR: No flow in right upper arm AV graft and cannot appreciate a thrill Radial signal at right wrist  DATA:   Right upper extremity graft duplex shows occluded AV graft  Assessment/Plan:  I had a long discussion with Mr. Shein and his sister regarding multiple failed accesses in both upper extremities and most recently now an occluded right upper arm AV graft just after it was placed on 07/03/2020.  We confirmed with duplex in the office that this is indeed occluded.  I was not terribly happy with the inflow at the time the graft was placed and I think the next best option would be converting him to an upper arm loop graft possibly with a bigger artery for better inflow in the axilla.  His brachial artery appeared fairly small at the  time of graft placement.  He had a lot of questions today which is understandable.  We will get him scheduled for Monday for right arm AVG revision.  I do not think just simply thrombectomizing the graft and expecting a better outcome would be a good plan moving forward.  If this fails he is likely going to be looking at groin access.   Marty Heck, MD [facility] Office: [phone]

## 2020-07-22 ENCOUNTER — Other Ambulatory Visit: Payer: Self-pay

## 2020-07-24 ENCOUNTER — Encounter (HOSPITAL_COMMUNITY): Payer: Self-pay | Admitting: Vascular Surgery

## 2020-07-24 MED ORDER — DEXTROSE 5 % IV SOLN
3.0000 g | INTRAVENOUS | Status: AC
  Administered 2020-07-27: 3 g via INTRAVENOUS
  Filled 2020-07-24: qty 3000
  Filled 2020-07-24: qty 3

## 2020-07-24 NOTE — Progress Notes (Signed)
PCP - Dr Zettie Cooley Cardiologist - n/a  Chest x-ray - n/a EKG - 05/06/20 Stress Test - 03/06/20 ECHO Limited - 09/22/16, Complete on 09/19/16 Cardiac Cath - n/a  Fasting Blood Sugar - unknown Checks Blood Sugar checks sugar a couple times a week  . THE NIGHT BEFORE SURGERY, take 10 units Lantus Insulin.       . THE MORNING OF SURGERY, take 10 units Lantus Insulin.  . If your CBG is greater than 220 mg/dL, you may take  of your sliding scale (correction) dose of insulin.  . If your blood sugar is less than 70 mg/dL, you will need to treat for low blood sugar: o Treat a low blood sugar (less than 70 mg/dL) with  cup of clear juice (cranberry or apple), 4 glucose tablets, OR glucose gel. o Recheck blood sugar in 15 minutes after treatment (to make sure it is greater than 70 mg/dL). If your blood sugar is not greater than 70 mg/dL on recheck, call [phone] for further instructions.  Anesthesia review: Yes  STOP now taking any Aspirin (unless otherwise instructed by your surgeon), Aleve, Naproxen, Ibuprofen, Motrin, Advil, Goody's, BC's, all herbal medications, fish oil, and all vitamins.   Coronavirus Screening Covid test is scheduled on Sat 07/25/20 Do you have any of the following symptoms:  Cough yes/no: No Fever (>100.82F)  yes/no: No Runny nose yes/no: No Sore throat yes/no: No Difficulty breathing/shortness of breath  yes/no: No  Have you traveled in the last 14 days and where? yes/no: No  Patient verbalized understanding of instructions that were given to them via phone.

## 2020-07-25 ENCOUNTER — Other Ambulatory Visit (HOSPITAL_COMMUNITY)
Admission: RE | Admit: 2020-07-25 | Discharge: 2020-07-25 | Disposition: A | Discharge status: Home / Self Care | Payer: Medicaid Other | Source: Ambulatory Visit | Attending: Vascular Surgery | Admitting: Vascular Surgery

## 2020-07-25 DIAGNOSIS — Z20822 Contact with and (suspected) exposure to covid-19: Secondary | ICD-10-CM | POA: Diagnosis not present

## 2020-07-25 DIAGNOSIS — Z01812 Encounter for preprocedural laboratory examination: Secondary | ICD-10-CM | POA: Insufficient documentation

## 2020-07-25 LAB — SARS CORONAVIRUS 2 (TAT 6-24 HRS): SARS Coronavirus 2: NEGATIVE

## 2020-07-27 ENCOUNTER — Encounter (HOSPITAL_COMMUNITY)
Admission: RE | Disposition: A | Discharge status: Home / Self Care | Payer: Self-pay | Source: Home / Self Care | Attending: Vascular Surgery

## 2020-07-27 ENCOUNTER — Ambulatory Visit (HOSPITAL_COMMUNITY): Payer: Medicaid Other | Admitting: Physician Assistant

## 2020-07-27 ENCOUNTER — Other Ambulatory Visit: Payer: Self-pay

## 2020-07-27 ENCOUNTER — Encounter (HOSPITAL_COMMUNITY): Payer: Self-pay | Admitting: Vascular Surgery

## 2020-07-27 ENCOUNTER — Ambulatory Visit (HOSPITAL_COMMUNITY)
Admission: RE | Admit: 2020-07-27 | Discharge: 2020-07-27 | Disposition: A | Discharge status: Home / Self Care | Payer: Medicaid Other | Attending: Vascular Surgery | Admitting: Vascular Surgery

## 2020-07-27 DIAGNOSIS — Z794 Long term (current) use of insulin: Secondary | ICD-10-CM | POA: Insufficient documentation

## 2020-07-27 DIAGNOSIS — Z992 Dependence on renal dialysis: Secondary | ICD-10-CM | POA: Diagnosis not present

## 2020-07-27 DIAGNOSIS — D631 Anemia in chronic kidney disease: Secondary | ICD-10-CM | POA: Diagnosis not present

## 2020-07-27 DIAGNOSIS — Y841 Kidney dialysis as the cause of abnormal reaction of the patient, or of later complication, without mention of misadventure at the time of the procedure: Secondary | ICD-10-CM | POA: Insufficient documentation

## 2020-07-27 DIAGNOSIS — I120 Hypertensive chronic kidney disease with stage 5 chronic kidney disease or end stage renal disease: Secondary | ICD-10-CM | POA: Diagnosis not present

## 2020-07-27 DIAGNOSIS — Z79890 Hormone replacement therapy (postmenopausal): Secondary | ICD-10-CM | POA: Diagnosis not present

## 2020-07-27 DIAGNOSIS — Z79899 Other long term (current) drug therapy: Secondary | ICD-10-CM | POA: Insufficient documentation

## 2020-07-27 DIAGNOSIS — N185 Chronic kidney disease, stage 5: Secondary | ICD-10-CM | POA: Diagnosis not present

## 2020-07-27 DIAGNOSIS — E1122 Type 2 diabetes mellitus with diabetic chronic kidney disease: Secondary | ICD-10-CM | POA: Diagnosis not present

## 2020-07-27 DIAGNOSIS — N186 End stage renal disease: Secondary | ICD-10-CM | POA: Diagnosis not present

## 2020-07-27 DIAGNOSIS — T82510A Breakdown (mechanical) of surgically created arteriovenous fistula, initial encounter: Secondary | ICD-10-CM | POA: Insufficient documentation

## 2020-07-27 DIAGNOSIS — Z6841 Body Mass Index (BMI) 40.0 and over, adult: Secondary | ICD-10-CM | POA: Insufficient documentation

## 2020-07-27 HISTORY — PX: THROMBECTOMY AND REVISION OF ARTERIOVENTOUS (AV) GORETEX  GRAFT: SHX6120

## 2020-07-27 HISTORY — DX: Hyperlipidemia, unspecified: E78.5

## 2020-07-27 LAB — POCT I-STAT, CHEM 8
BUN: 55 mg/dL — ABNORMAL HIGH (ref 6–20)
Calcium, Ion: 0.97 mmol/L — ABNORMAL LOW (ref 1.15–1.40)
Chloride: 101 mmol/L (ref 98–111)
Creatinine, Ser: 17 mg/dL — ABNORMAL HIGH (ref 0.61–1.24)
Glucose, Bld: 99 mg/dL (ref 70–99)
HCT: 46 % (ref 39.0–52.0)
Hemoglobin: 15.6 g/dL (ref 13.0–17.0)
Potassium: 4.1 mmol/L (ref 3.5–5.1)
Sodium: 139 mmol/L (ref 135–145)
TCO2: 25 mmol/L (ref 22–32)

## 2020-07-27 LAB — GLUCOSE, CAPILLARY
Glucose-Capillary: 104 mg/dL — ABNORMAL HIGH (ref 70–99)
Glucose-Capillary: 80 mg/dL (ref 70–99)
Glucose-Capillary: 155 mg/dL — ABNORMAL HIGH (ref 70–99)

## 2020-07-27 SURGERY — THROMBECTOMY AND REVISION OF ARTERIOVENTOUS (AV) GORETEX  GRAFT
Anesthesia: General | Site: Arm Upper | Laterality: Right

## 2020-07-27 MED ORDER — FENTANYL CITRATE (PF) 250 MCG/5ML IJ SOLN
INTRAMUSCULAR | Status: DC | PRN
  Administered 2020-07-27: 50 ug via INTRAVENOUS
  Administered 2020-07-27: 100 ug via INTRAVENOUS
  Administered 2020-07-27 (×2): 50 ug via INTRAVENOUS

## 2020-07-27 MED ORDER — ONDANSETRON HCL 4 MG/2ML IJ SOLN
INTRAMUSCULAR | Status: AC
  Filled 2020-07-27: qty 2

## 2020-07-27 MED ORDER — KETAMINE HCL 10 MG/ML IJ SOLN
INTRAMUSCULAR | Status: DC | PRN
  Administered 2020-07-27: 20 mg via INTRAVENOUS

## 2020-07-27 MED ORDER — DEXAMETHASONE SODIUM PHOSPHATE 10 MG/ML IJ SOLN
INTRAMUSCULAR | Status: DC | PRN
  Administered 2020-07-27: 10 mg via INTRAVENOUS

## 2020-07-27 MED ORDER — CHLORHEXIDINE GLUCONATE 0.12 % MT SOLN
OROMUCOSAL | Status: AC
  Administered 2020-07-27: 15 mL via OROMUCOSAL
  Filled 2020-07-27: qty 15

## 2020-07-27 MED ORDER — SODIUM CHLORIDE 0.9 % IV SOLN: INTRAVENOUS | Status: DC | PRN

## 2020-07-27 MED ORDER — ALBUMIN HUMAN 5 % IV SOLN: INTRAVENOUS | Status: DC | PRN

## 2020-07-27 MED ORDER — CHLORHEXIDINE GLUCONATE 0.12 % MT SOLN: 15.0000 mL | Freq: Once | OROMUCOSAL | Status: AC

## 2020-07-27 MED ORDER — PROPOFOL 10 MG/ML IV BOLUS
INTRAVENOUS | Status: AC
  Filled 2020-07-27: qty 40

## 2020-07-27 MED ORDER — OXYCODONE HCL 5 MG PO TABS
5.0000 mg | ORAL_TABLET | Freq: Once | ORAL | Status: AC | PRN
  Administered 2020-07-27: 5 mg via ORAL

## 2020-07-27 MED ORDER — SODIUM CHLORIDE 0.9 % IV SOLN: INTRAVENOUS | Status: DC

## 2020-07-27 MED ORDER — CHLORHEXIDINE GLUCONATE 4 % EX LIQD: 60.0000 mL | Freq: Once | CUTANEOUS | Status: DC

## 2020-07-27 MED ORDER — HEPARIN SODIUM (PORCINE) 1000 UNIT/ML IJ SOLN
INTRAMUSCULAR | Status: DC | PRN
  Administered 2020-07-27: 8000 [IU] via INTRAVENOUS

## 2020-07-27 MED ORDER — ONDANSETRON HCL 4 MG/2ML IJ SOLN: 4.0000 mg | Freq: Once | INTRAMUSCULAR | Status: DC | PRN

## 2020-07-27 MED ORDER — OXYCODONE HCL 5 MG PO TABS
ORAL_TABLET | ORAL | Status: AC
  Filled 2020-07-27: qty 1

## 2020-07-27 MED ORDER — STERILE WATER FOR IRRIGATION IR SOLN
Status: DC | PRN
  Administered 2020-07-27: 1000 mL

## 2020-07-27 MED ORDER — PROTAMINE SULFATE 10 MG/ML IV SOLN
INTRAVENOUS | Status: AC
  Filled 2020-07-27: qty 5

## 2020-07-27 MED ORDER — ONDANSETRON HCL 4 MG/2ML IJ SOLN
INTRAMUSCULAR | Status: DC | PRN
  Administered 2020-07-27: 4 mg via INTRAVENOUS

## 2020-07-27 MED ORDER — FENTANYL CITRATE (PF) 250 MCG/5ML IJ SOLN
INTRAMUSCULAR | Status: AC
  Filled 2020-07-27: qty 5

## 2020-07-27 MED ORDER — OXYCODONE HCL 5 MG/5ML PO SOLN: 5.0000 mg | Freq: Once | ORAL | Status: AC | PRN

## 2020-07-27 MED ORDER — ORAL CARE MOUTH RINSE: 15.0000 mL | Freq: Once | OROMUCOSAL | Status: AC

## 2020-07-27 MED ORDER — PROTAMINE SULFATE 10 MG/ML IV SOLN
INTRAVENOUS | Status: DC | PRN
  Administered 2020-07-27 (×2): 10 mg via INTRAVENOUS

## 2020-07-27 MED ORDER — MIDAZOLAM HCL 2 MG/2ML IJ SOLN
INTRAMUSCULAR | Status: DC | PRN
  Administered 2020-07-27: 2 mg via INTRAVENOUS

## 2020-07-27 MED ORDER — 0.9 % SODIUM CHLORIDE (POUR BTL) OPTIME
TOPICAL | Status: DC | PRN
  Administered 2020-07-27: 1000 mL

## 2020-07-27 MED ORDER — MIDAZOLAM HCL 2 MG/2ML IJ SOLN
INTRAMUSCULAR | Status: AC
  Filled 2020-07-27: qty 2

## 2020-07-27 MED ORDER — KETAMINE HCL 50 MG/5ML IJ SOSY
PREFILLED_SYRINGE | INTRAMUSCULAR | Status: AC
  Filled 2020-07-27: qty 5

## 2020-07-27 MED ORDER — PROPOFOL 10 MG/ML IV BOLUS
INTRAVENOUS | Status: DC | PRN
  Administered 2020-07-27: 100 mg via INTRAVENOUS
  Administered 2020-07-27: 200 mg via INTRAVENOUS
  Administered 2020-07-27: 20 mg via INTRAVENOUS
  Administered 2020-07-27: 30 mg via INTRAVENOUS
  Administered 2020-07-27: 100 mg via INTRAVENOUS
  Administered 2020-07-27: 30 mg via INTRAVENOUS

## 2020-07-27 MED ORDER — PROPOFOL 10 MG/ML IV BOLUS
INTRAVENOUS | Status: AC
  Filled 2020-07-27: qty 20

## 2020-07-27 MED ORDER — ACETAMINOPHEN 10 MG/ML IV SOLN
1000.0000 mg | Freq: Once | INTRAVENOUS | Status: DC | PRN
Start: 2020-07-27 — End: 2020-07-27

## 2020-07-27 MED ORDER — SODIUM CHLORIDE 0.9 % IV SOLN
INTRAVENOUS | Status: AC
  Filled 2020-07-27: qty 1.2

## 2020-07-27 MED ORDER — HYDROCODONE-ACETAMINOPHEN 5-325 MG PO TABS
1.0000 | ORAL_TABLET | Freq: Four times a day (QID) | ORAL | 0 refills | Status: DC | PRN
Start: 2020-07-27 — End: 2021-04-30

## 2020-07-27 MED ORDER — KETAMINE HCL 10 MG/ML IJ SOLN: INTRAMUSCULAR | Status: DC | PRN

## 2020-07-27 MED ORDER — LIDOCAINE 2% (20 MG/ML) 5 ML SYRINGE
INTRAMUSCULAR | Status: DC | PRN
  Administered 2020-07-27: 100 mg via INTRAVENOUS

## 2020-07-27 MED ORDER — HEPARIN SODIUM (PORCINE) 1000 UNIT/ML IJ SOLN
INTRAMUSCULAR | Status: AC
  Filled 2020-07-27: qty 1

## 2020-07-27 MED ORDER — FENTANYL CITRATE (PF) 100 MCG/2ML IJ SOLN: 25.0000 ug | INTRAMUSCULAR | Status: DC | PRN

## 2020-07-27 MED ORDER — DEXMEDETOMIDINE (PRECEDEX) IN NS 20 MCG/5ML (4 MCG/ML) IV SYRINGE
PREFILLED_SYRINGE | INTRAVENOUS | Status: DC | PRN
  Administered 2020-07-27 (×2): 12 ug via INTRAVENOUS
  Administered 2020-07-27 (×2): 8 ug via INTRAVENOUS

## 2020-07-27 MED ORDER — DEXAMETHASONE SODIUM PHOSPHATE 10 MG/ML IJ SOLN
INTRAMUSCULAR | Status: AC
  Filled 2020-07-27: qty 1

## 2020-07-27 SURGICAL SUPPLY — 43 items
ARMBAND PINK RESTRICT EXTREMIT (MISCELLANEOUS) ×2 IMPLANT
CANISTER SUCT 3000ML PPV (MISCELLANEOUS) ×2 IMPLANT
CATH EMB 3FR 80CM (CATHETERS) IMPLANT
CATH EMB 4FR 80CM (CATHETERS) IMPLANT
CATH EMB 5FR 80CM (CATHETERS) IMPLANT
CLIP VESOCCLUDE MED 6/CT (CLIP) ×2 IMPLANT
CLIP VESOCCLUDE SM WIDE 6/CT (CLIP) ×2 IMPLANT
COVER PROBE W GEL 5X96 (DRAPES) ×2 IMPLANT
COVER WAND RF STERILE (DRAPES) IMPLANT
DERMABOND ADVANCED (GAUZE/BANDAGES/DRESSINGS) ×1
DERMABOND ADVANCED .7 DNX12 (GAUZE/BANDAGES/DRESSINGS) ×1 IMPLANT
ELECT REM PT RETURN 9FT ADLT (ELECTROSURGICAL) ×2
ELECTRODE REM PT RTRN 9FT ADLT (ELECTROSURGICAL) ×1 IMPLANT
GLOVE BIO SURGEON STRL SZ7.5 (GLOVE) IMPLANT
GLOVE SRG 8 PF TXTR STRL LF DI (GLOVE) ×1 IMPLANT
GLOVE SURG PR MICRO ENCORE 7.5 (GLOVE) ×4 IMPLANT
GLOVE SURG UNDER POLY LF SZ6.5 (GLOVE) ×6 IMPLANT
GLOVE SURG UNDER POLY LF SZ8 (GLOVE) ×1
GOWN STRL REUS W/ TWL LRG LVL3 (GOWN DISPOSABLE) ×1 IMPLANT
GOWN STRL REUS W/ TWL XL LVL3 (GOWN DISPOSABLE) ×2 IMPLANT
GOWN STRL REUS W/TWL LRG LVL3 (GOWN DISPOSABLE) ×1
GOWN STRL REUS W/TWL XL LVL3 (GOWN DISPOSABLE) ×2
GRAFT GORETEX STRT 4-7X45 (Vascular Products) IMPLANT
GRAFT GORETEX STRT 6X50 (Vascular Products) ×2 IMPLANT
HEMOSTAT SPONGE AVITENE ULTRA (HEMOSTASIS) IMPLANT
INSERT FOGARTY SM (MISCELLANEOUS) ×2 IMPLANT
KIT BASIN OR (CUSTOM PROCEDURE TRAY) ×2 IMPLANT
KIT TURNOVER KIT B (KITS) ×2 IMPLANT
LOOP VESSEL MINI RED (MISCELLANEOUS) ×2 IMPLANT
NS IRRIG 1000ML POUR BTL (IV SOLUTION) ×2 IMPLANT
PACK CV ACCESS (CUSTOM PROCEDURE TRAY) ×2 IMPLANT
PAD ARMBOARD 7.5X6 YLW CONV (MISCELLANEOUS) ×4 IMPLANT
SUT MNCRL AB 4-0 PS2 18 (SUTURE) ×2 IMPLANT
SUT PROLENE 6 0 BV (SUTURE) ×4 IMPLANT
SUT PROLENE 6 0 CC (SUTURE) ×2 IMPLANT
SUT SILK 2 0 SH (SUTURE) ×2 IMPLANT
SUT VIC AB 2-0 CT1 27 (SUTURE) ×1
SUT VIC AB 2-0 CT1 TAPERPNT 27 (SUTURE) ×1 IMPLANT
SUT VIC AB 3-0 SH 27 (SUTURE) ×1
SUT VIC AB 3-0 SH 27X BRD (SUTURE) ×1 IMPLANT
TOWEL GREEN STERILE (TOWEL DISPOSABLE) ×2 IMPLANT
UNDERPAD 30X36 HEAVY ABSORB (UNDERPADS AND DIAPERS) ×2 IMPLANT
WATER STERILE IRR 1000ML POUR (IV SOLUTION) ×2 IMPLANT

## 2020-07-27 NOTE — Anesthesia Procedure Notes (Signed)
Procedure Name: LMA Insertion Date/Time: 07/27/2020 1:41 PM Performed by: Jenne Campus, CRNA Pre-anesthesia Checklist: Patient identified, Emergency Drugs available, Suction available and Patient being monitored Patient Re-evaluated:Patient Re-evaluated prior to induction Oxygen Delivery Method: Circle System Utilized Preoxygenation: Pre-oxygenation with 100% oxygen Induction Type: IV induction Ventilation: Mask ventilation without difficulty LMA: LMA inserted LMA Size: 5.0 Number of attempts: 1 Airway Equipment and Method: Bite block Placement Confirmation: positive ETCO2 and breath sounds checked- equal and bilateral Tube secured with: Tape Dental Injury: Teeth and Oropharynx as per pre-operative assessment

## 2020-07-27 NOTE — Discharge Instructions (Signed)
° °  [facility] [place] ° °Discharge Instructions ° °AV Fistula or Graft Surgery for Dialysis Access ° °Please refer to the following instructions for your post-procedure care. Your surgeon or physician assistant will discuss any changes with you. ° °Activity ° °You may drive the day following your surgery, if you are comfortable and no longer taking prescription pain medication. Resume full activity as the soreness in your incision resolves. ° °Bathing/Showering ° °You may shower after you go home. Keep your incision dry for 48 hours. Do not soak in a bathtub, hot tub, or swim until the incision heals completely. You may not shower if you have a hemodialysis catheter. ° °Incision Care ° °Clean your incision with mild soap and water after 48 hours. Pat the area dry with a clean towel. You do not need a bandage unless otherwise instructed. Do not apply any ointments or creams to your incision. You may have skin glue on your incision. Do not peel it off. It will come off on its own in about one week. Your arm may swell a bit after surgery. To reduce swelling use pillows to elevate your arm so it is above your heart. Your doctor will tell you if you need to lightly wrap your arm with an ACE bandage. ° °Diet ° °Resume your normal diet. There are not special food restrictions following this procedure. In order to heal from your surgery, it is CRITICAL to get adequate nutrition. Your body requires vitamins, minerals, and protein. Vegetables are the best source of vitamins and minerals. Vegetables also provide the perfect balance of protein. Processed food has little nutritional value, so try to avoid this. ° °Medications ° °Resume taking all of your medications. If your incision is causing pain, you may take over-the counter pain relievers such as acetaminophen (Tylenol). If you were prescribed a stronger pain medication, please be aware these medications can cause nausea and constipation. Prevent  nausea by taking the medication with a snack or meal. Avoid constipation by drinking plenty of fluids and eating foods with high amount of fiber, such as fruits, vegetables, and grains. Do not take Tylenol if you are taking prescription pain medications. ° ° ° ° °Follow up °Your surgeon may want to see you in the office following your access surgery. If so, this will be arranged at the time of your surgery. ° °Please call us immediately for any of the following conditions: ° °Increased pain, redness, drainage (pus) from your incision site °Fever of 101 degrees or higher °Severe or worsening pain at your incision site °Hand pain or numbness. ° °Reduce your risk of vascular disease: ° °Stop smoking. If you would like help, call [name] at 1-800-QUIT-NOW (1-800-784-8669) or [facility] at [phone] ° °Manage your cholesterol °Maintain a desired weight °Control your diabetes °Keep your blood pressure down ° °Dialysis ° °It will take several weeks to several months for your new dialysis access to be ready for use. Your surgeon will determine when it is OK to use it. Your nephrologist will continue to direct your dialysis. You can continue to use your Permcath until your new access is ready for use. ° °If you have any questions, please call the office at [phone]. ° °

## 2020-07-27 NOTE — Anesthesia Postprocedure Evaluation (Signed)
Anesthesia Post Note  Patient: Paul [name]  Procedure(s) Performed: INSERTION OF RIGHT ARM LOOP GRAFT WITH EXCISION OF RIGHT ARM BRACHIAL AXILLARY GRAFT (Right Arm Upper)     Patient location during evaluation: PACU Anesthesia Type: General Level of consciousness: awake Pain management: pain level controlled Vital Signs Assessment: post-procedure vital signs reviewed and stable Respiratory status: spontaneous breathing, nonlabored ventilation, respiratory function stable and patient connected to nasal cannula oxygen Cardiovascular status: blood pressure returned to baseline and stable Postop Assessment: no apparent nausea or vomiting Anesthetic complications: no   No complications documented.  Last Vitals:  Vitals:   07/27/20 1656 07/27/20 1720  BP: (!) 144/96 (!) 145/60  Pulse: 88 87  Resp: 17 17  Temp:  36.5 C  SpO2: 100% 100%    Last Pain:  Vitals:   07/27/20 1720  TempSrc:   PainSc: 3                  [name] P Lenny Bouchillon

## 2020-07-27 NOTE — Transfer of Care (Signed)
Immediate Anesthesia Transfer of Care Note  Patient: Roland Lemmen  Procedure(s) Performed: INSERTION OF RIGHT ARM LOOP GRAFT WITH EXCISION OF RIGHT ARM BRACHIAL AXILLARY GRAFT (Right Arm Upper)  Patient Location: PACU  Anesthesia Type:General  Level of Consciousness: oriented, drowsy and patient cooperative  Airway & Oxygen Therapy: Patient Spontanous Breathing and Patient connected to face mask oxygen  Post-op Assessment: Report given to RN and Post -op Vital signs reviewed and stable  Post vital signs: Reviewed  Last Vitals:  Vitals Value Taken Time  BP 142/41 07/27/20 1625  Temp    Pulse 95 07/27/20 1629  Resp 25 07/27/20 1629  SpO2 96 % 07/27/20 1629  Vitals shown include unvalidated device data.  Last Pain:  Vitals:   07/27/20 1136  TempSrc: Oral  PainSc:       Patients Stated Pain Goal: 3 ([id number])  Complications: No complications documented.

## 2020-07-27 NOTE — H&P (Signed)
History and Physical Interval Note:  07/27/2020 11:54 AM  Paul [name]  has presented today for surgery, with the diagnosis of ESRD.  The various methods of treatment have been discussed with the patient and family. After consideration of risks, benefits and other options for treatment, the patient has consented to  Procedure(s): REVISION OF RIGHT ARM ARTERIOVENTOUS (AV) GORETEX  GRAFT (Right) as a surgical intervention.  The patient's history has been reviewed, patient examined, no change in status, stable for surgery.  I have reviewed the patient's chart and labs.  Questions were answered to the patient's satisfaction.    Revision of right arm AVG since occluded, likely convert to upper arm loop graft.  Marty Heck  Patient name: Paul [name]            MRN: [id number]        DOB: Sep 03, 1990            Sex: male  REASON FOR VISIT: Possibly occluded right upper arm AV graft  HPI: [name] is a 30 y.o. male with end-stage renal disease on dialysis Tuesday Thursday Friday that presents for evaluation of his right upper arm AV graft that was recently placed with concern for occlusion.  His graft was placed on 07/03/2020 and he was seen last week by our PA with a patent graft.  Ultimately this was felt to be occluded on Friday when he saw one of the PAs with nephrology.  He has a complex history including a left brachiocephalic in 4128 by Dr. Oneida Alar and then later a left upper extremity loop graft in 2018 by Dr. Donnetta Hutching that required thrombectomy and declots including by myself.  Ultimately this eventually failed and he had a right first stage basilic vein with Dr. Trula Slade and failed and then recently an upper arm graft by myself on 07/03/20.      Past Medical History:  Diagnosis Date  . Acute hypoxemic respiratory failure ([place]) 09/11/2016  . Anemia   . Chronic kidney disease    ARF on CRF Dialysis T/TH/Sa  . Diabetes mellitus    Type II  . End stage renal disease on dialysis  [facility])    [facility]   . GERD (gastroesophageal reflux disease)   . Hypertension   . Hypothyroidism   . Morbid obesity ([place])   . Sacral wound   . Thyroid disease          Past Surgical History:  Procedure Laterality Date  . AV FISTULA PLACEMENT Left 09/26/2016   Procedure: LEFT UPPER ARM ARTERIOVENOUS (AV) FISTULA CREATION;  Surgeon: Elam Dutch, MD;  Location: [facility] OR;  Service: Vascular;  Laterality: Left;  . AV FISTULA PLACEMENT Left 12/02/2016   Procedure: INSERTION OF ARTERIOVENOUS GORE-TEX GRAFT LEFT UPPER  ARM USING A 4-7MM BY 45CM GRAFT ;  Surgeon: Rosetta Posner, MD;  Location: [facility] OR;  Service: Vascular;  Laterality: Left;  . AV FISTULA PLACEMENT Right 07/03/2020   Procedure: INSERTION OF ARTERIOVENOUS (AV) GORE-TEX GRAFT RIGHT UPPER ARM;  Surgeon: Marty Heck, MD;  Location: [place];  Service: Vascular;  Laterality: Right;  . BASCILIC VEIN TRANSPOSITION Right 05/06/2020   Procedure: FIRST STAGE RIGHT BASCILIC VEIN TRANSPOSITION;  Surgeon: Serafina Mitchell, MD;  Location: [place];  Service: Vascular;  Laterality: Right;  . EXCHANGE OF A DIALYSIS CATHETER Right 10/08/2016   Procedure: EXCHANGE OF A DIALYSIS CATHETER-RIGHT INTERNAL JUGULAR;  Surgeon: Conrad Hiram, MD;  Location: [place];  Service: Vascular;  Laterality: Right;  .  INSERTION OF DIALYSIS CATHETER Right 09/26/2016   Procedure: INSERTION OF DIALYSIS CATHETER - Right Internal Jugular Placement;  Surgeon: Elam Dutch, MD;  Location: [facility] OR;  Service: Vascular;  Laterality: Right;  . INSERTION OF DIALYSIS CATHETER Left 03/22/2018   Procedure: INSERTION OF DIALYSIS CATHETER;  Surgeon: Marty Heck, MD;  Location: [place];  Service: Vascular;  Laterality: Left;  . REVISON OF ARTERIOVENOUS FISTULA Left 03/22/2018   Procedure: LEFT UPPER EXTREMITY ARTERIOVENOUS  REVISION WITH GORE-TEX GRAFT.;  Surgeon: Marty Heck, MD;  Location: [place];  Service: Vascular;  Laterality: Left;  .  THROMBECTOMY Left 08/26/2019   thrombectomy of LUA loop AVG  . VISCERAL ANGIOGRAPHY Right 06/17/2020   Procedure: CENTRAL VENO;  Surgeon: Cherre Robins, MD;  Location: [place] [facility];  Service: Cardiovascular;  Laterality: Right;         Family History  Problem Relation Age of Onset  . Heart disease Mother   . Hypertension Mother     SOCIAL HISTORY: Social History       Tobacco Use  . Smoking status: Never Smoker  . Smokeless tobacco: Never Used  Substance Use Topics  . Alcohol use: No         Allergies  Allergen Reactions  . Hydralazine Hcl Other (See Comments)    DRUG-INDUCED LUPUS          Current Outpatient Medications  Medication Sig Dispense Refill  . Accu-Chek FastClix Lancets MISC USE AS DIRECTED ONCE DAILY 102 each 0  . acetaminophen (TYLENOL) 500 MG tablet Take 1,000 mg by mouth every 6 (six) hours as needed (for pain.).    Marland Kitchen amLODipine (NORVASC) 5 MG tablet Take 5 mg on nondialysis days. (Patient taking differently: Take 5 mg by mouth every Monday, Wednesday, Friday, Saturday, and Sunday at 6 PM. Take 5 mg on nondialysis days.) 90 tablet 2  . Blood Glucose Monitoring Suppl (ACCU-CHEK GUIDE ME) w/Device KIT 1 kit by Does not apply route daily. 1 kit 0  . cinacalcet (SENSIPAR) 30 MG tablet Take 30 mg by mouth every evening.    . ferric citrate (AURYXIA) 1 GM 210 MG(Fe) tablet Take 420-840 mg by mouth See admin instructions. Take 4 tablets (840 mg) by mouth with each meals & take 2-3 tablets (420 mg-630 mg) by mouth with each snack.    Marland Kitchen glucose blood (ACCU-CHEK GUIDE) test strip 1 each by Other route 2 (two) times daily. Use as instructed 100 each 12  . insulin glargine (LANTUS) 100 UNIT/ML injection INJECT 10 TO  15 UNITS INTO THE SKIN TWICE DAILY. PATIENT NEEDS APPOINTMENT 10 mL 0  . levothyroxine (SYNTHROID) 75 MCG tablet Take 1 tablet (75 mcg total) by mouth daily before breakfast. 30 tablet 0  . multivitamin (RENA-VIT) TABS tablet  Take 1 tablet by mouth at bedtime. (Patient taking differently: Take 1 tablet by mouth every evening.) 30 tablet 0  . oxyCODONE-acetaminophen (PERCOCET) 5-325 MG tablet Take 1 tablet by mouth every 4 (four) hours as needed for severe pain. 20 tablet 0  . rosuvastatin (CRESTOR) 20 MG tablet Take 2 tablets (40 mg total) by mouth daily. (Patient taking differently: Take 20 mg by mouth every evening.) 90 tablet 0  . Syringe, Disposable, (B-D SYRINGE SLIP TIP 30CC) 30 ML MISC 1 Syringe by Does not apply route daily. 30 each 11   No current facility-administered medications for this visit.    REVIEW OF SYSTEMS:  [X]  denotes positive finding, [ ]  denotes negative finding  Cardiac  Comments:  Chest pain or chest pressure:    Shortness of breath upon exertion:    Short of breath when lying flat:    Irregular heart rhythm:        Vascular    Pain in calf, thigh, or hip brought on by ambulation:    Pain in feet at night that wakes you up from your sleep:     Blood clot in your veins:    Leg swelling:         Pulmonary    Oxygen at home:    Productive cough:     Wheezing:         Neurologic    Sudden weakness in arms or legs:     Sudden numbness in arms or legs:     Sudden onset of difficulty speaking or slurred speech:    Temporary loss of vision in one eye:     Problems with dizziness:         Gastrointestinal    Blood in stool:     Vomited blood:         Genitourinary    Burning when urinating:     Blood in urine:        Psychiatric    Major depression:         Hematologic    Bleeding problems:    Problems with blood clotting too easily:        Skin    Rashes or ulcers:        Constitutional    Fever or chills:      PHYSICAL EXAM:    Vitals:   07/21/20 1534  BP: 117/84  Pulse: (!) 101  Resp: 16  Temp: 98.3 F (36.8 C)  TempSrc: Temporal  SpO2: 97%  Weight: (!)  322 lb (146.1 kg)  Height: 6' 1"  (1.854 m)    GENERAL: The patient is a well-nourished male, in no acute distress. The vital signs are documented above. CARDIAC: There is a regular rate and rhythm.  VASCULAR: No flow in right upper arm AV graft and cannot appreciate a thrill Radial signal at right wrist  DATA:   Right upper extremity graft duplex shows occluded AV graft  Assessment/Plan:  I had a long discussion with Mr. Bakos and his sister regarding multiple failed accesses in both upper extremities and most recently now an occluded right upper arm AV graft just after it was placed on 07/03/2020.  We confirmed with duplex in the office that this is indeed occluded.  I was not terribly happy with the inflow at the time the graft was placed and I think the next best option would be converting him to an upper arm loop graft possibly with a bigger artery for better inflow in the axilla.  His brachial artery appeared fairly small at the time of graft placement.  He had a lot of questions today which is understandable.  We will get him scheduled for Monday for right arm AVG revision.  I do not think just simply thrombectomizing the graft and expecting a better outcome would be a good plan moving forward.  If this fails he is likely going to be looking at groin access.   Marty Heck, MD [facility] [place] Office: [phone]

## 2020-07-27 NOTE — Op Note (Signed)
OPERATIVE NOTE   PROCEDURE: Right upper arm AV loop graft (axillary artery to axillary vein 6 mm Gore Tex graft)  PRE-OPERATIVE DIAGNOSIS: end stage renal disease   POST-OPERATIVE DIAGNOSIS: Same  SURGEON: Marty Heck, MD  ASSISTANT(S): Arlee Muslim, PA  ANESTHESIA: LMA  ESTIMATED BLOOD LOSS: Less than 75 mL  FINDING(S): A right upper extremity loop graft was sewn from the axillary artery to the axillary vein and tunneled in loop configuration in the upper arm.  There was an appreciable thrill at completion although somewhat difficult to appreciate given morbidly obese.  I did excise his previous right upper arm brachial artery to axillary vein graft but left cuffs on the artery and vein.  Brisk radial signal at completion at the wrist.  SPECIMEN(S):    None  INDICATIONS:   Paul [name] is a 30 y.o. male who presents with end-stage renal disease and need for permanent dialysis access.  He recently underwent a right upper extremity brachial artery to axillary vein graft that thrombosed within 1 week.  I was not particularly happy with the inflow given he had a fairly small brachial artery.  I have now proposed upper extremity loop graft for better inflow.  Risk, benefits, and alternatives to access surgery were discussed.  The patient is aware the risks include but are not limited to: bleeding, infection, steal syndrome, nerve damage, ischemic monomelic neuropathy, failure to mature, and need for additional procedures.  The patient is aware of the risks and elects to proceed forward.  DESCRIPTION: After full informed written consent was obtained from the patient, the patient was brought back to the operating room and placed supine upon the operating table.  The patient was given IV antibiotics prior to proceeding.  After obtaining adequate sedation, the patient was prepped and draped in standard fashion for a right arm access procedure.  Ultimately, I reopened the axillary  incision.  There was extensive adhesions and scar tissue here.  Through extensive dissection with Metzenbaum scissors and Bovie cautery isolated the axillary artery as well as the axillary vein.  Axillary artery was larger and had a better pulse than his brachial artery that was small for his size.  I then made a counterincision just above the elbow.  I then used a curved tunneler and tunneled a 6 mm Gore-Tex graft in loop configuration in the upper arm using the counterincision.  Patient was given 8000 units of IV heparin.  I then clamped the axillary artery with Vesseloops and this was opened with 11 blade scalpel extended with Potts scissors.  The graft was sewn end to side to the axillary artery with 6-0 Prolene in parachute technique.  We had excellent pulsatile inflow in the graft.  I then used a Henley clamp on the axillary vein and baby profunda clamps on the brachial veins and the larger brachial vein was opened onto the axillary vein with Potts scissors.  I then sewed an end-to-side anastomosis with 6-0 Prolene in parachute technique.  We de-aired everything prior to completion.  There was a thrill in the graft.  We had a good radial signal at the wrist as well.  Unfortunately was apparent that his previous brachial artery to axillary vein graft (now occluded) was going to interfere with access of his new loop graft.  I then elected to open the previous incision over his brachial artery just above antecubitum and ultimately the previous brach ax graft that was occluded was then oversewn on both arterial and  venous ends with 2-0 silk tie on right angle clamp and the graft was transected and pulled through its previously created tunnel.  This was passed off the field.  All incisions were irrigated.  We gave 20 mg protamine.  Axillary incision and brachial incision were closed multiple layers of 2-0 Vicryl, 3-0 Vicryl, 4-0 Monocryl and Dermabond.  The counterincision on the arm for the loop graft closed with  a 3-0 Vicryl 4-0 Monocryl and Dermabond.  COMPLICATIONS: None  CONDITION: Stable   Marty Heck, MD [facility]: [phone]   07/27/2020, 3:59 PM

## 2020-07-27 NOTE — Anesthesia Preprocedure Evaluation (Addendum)
Anesthesia Evaluation  Patient identified by MRN, date of birth, ID band Patient awake    Reviewed: Allergy & Precautions, H&P , NPO status , Patient's Chart, lab work & pertinent test results  Airway Mallampati: III  TM Distance: >3 FB Neck ROM: Full    Dental no notable dental hx. (+) Dental Advisory Given, Teeth Intact   Pulmonary neg pulmonary ROS,    Pulmonary exam normal breath sounds clear to auscultation + decreased breath sounds      Cardiovascular hypertension, Pt. on medications Normal cardiovascular exam Rhythm:Regular Rate:Normal     Neuro/Psych negative neurological ROS  negative psych ROS   GI/Hepatic negative GI ROS, Neg liver ROS,   Endo/Other  diabetes, Poorly Controlled, Type 2Hypothyroidism Morbid obesity  Renal/GU DialysisRenal diseaseLast HD  K 4.1  negative genitourinary   Musculoskeletal negative musculoskeletal ROS (+)   Abdominal (+) + obese,   Peds negative pediatric ROS (+)  Hematology negative hematology ROS (+)   Anesthesia Other Findings   Reproductive/Obstetrics negative OB ROS                           Anesthesia Physical Anesthesia Plan  ASA: III  Anesthesia Plan: General   Post-op Pain Management:    Induction: Intravenous  PONV Risk Score and Plan: 2 and Ondansetron, Dexamethasone and Treatment may vary due to age or medical condition  Airway Management Planned: LMA  Additional Equipment:   Intra-op Plan:   Post-operative Plan: Extubation in OR  Informed Consent: I have reviewed the patients History and Physical, chart, labs and discussed the procedure including the risks, benefits and alternatives for the proposed anesthesia with the patient or authorized representative who has indicated his/her understanding and acceptance.     Dental advisory given  Plan Discussed with: CRNA and Surgeon  Anesthesia Plan Comments:          Anesthesia Quick Evaluation

## 2020-07-28 ENCOUNTER — Encounter (HOSPITAL_COMMUNITY): Payer: Self-pay | Admitting: Vascular Surgery

## 2020-08-20 DIAGNOSIS — Z992 Dependence on renal dialysis: Secondary | ICD-10-CM | POA: Diagnosis not present

## 2020-08-20 DIAGNOSIS — E1129 Type 2 diabetes mellitus with other diabetic kidney complication: Secondary | ICD-10-CM | POA: Diagnosis not present

## 2020-08-20 DIAGNOSIS — N186 End stage renal disease: Secondary | ICD-10-CM | POA: Diagnosis not present

## 2020-09-02 ENCOUNTER — Other Ambulatory Visit: Payer: Self-pay

## 2020-09-02 ENCOUNTER — Ambulatory Visit (INDEPENDENT_AMBULATORY_CARE_PROVIDER_SITE_OTHER): Payer: Medicaid Other | Admitting: Physician Assistant

## 2020-09-02 VITALS — BP 119/82 | HR 90 | Temp 98.0°F | Resp 20 | Ht 74.0 in | Wt 334.7 lb

## 2020-09-02 DIAGNOSIS — Z992 Dependence on renal dialysis: Secondary | ICD-10-CM

## 2020-09-02 DIAGNOSIS — N186 End stage renal disease: Secondary | ICD-10-CM

## 2020-09-02 NOTE — Progress Notes (Signed)
Postoperative Access Visit   History of Present Illness   Paul [name] is a 30 y.o. year old male who presents for postoperative follow-up for: right upper arm loop graft  By Dr. Carlis Abbott (Date: 07/27/20).  This patient has had several access surgeries on both arms and is considered to be on his last access before attempting thigh graft per Dr. Carlis Abbott.  Patient states they attempted to cannulate his right upper arm loop graft.  They were able to use one small needle to access the graft.  They also had some confusion about the configuration of the graft in his upper arm.  He is dialyzing on a Tuesday Thursday Saturday schedule at the kidney center on [address] in [place].  He still has a right IJ TDC.  Patient states he met with a transplant team however he needs to be under 300 pounds prior to being placed on the transplant list.  He currently weighs 334 pounds.   Physical Examination   Vitals:   09/02/20 1101  BP: 119/82  Pulse: 90  Resp: 20  Temp: 98 F (36.7 C)  TempSrc: Temporal  SpO2: 100%  Weight: (!) 334 lb 11.2 oz (151.8 kg)  Height: 6' 2" (1.88 m)   Body mass index is 42.97 kg/m.  right arm Incisions are healing well; axilla incision with superficial dehiscence however wound bed is clean and dry; palpable radial pulse, hand grip is 5/5, sensation in digits is intact, palpable thrill, bruit can be auscultated     Medical Decision Making   Paul [name] is a 30 y.o. year old male who presents s/p right upper arm loop graft   Patent R upper arm loop graft without signs or symptoms of steal syndrome  The patient's access is ready for use and I traced the path of the graft through the upper arm with a marker and also indicated the direction of flow  The patient's tunneled dialysis catheter can be removed when Nephrology is comfortable with the performance of the graft  The patient may follow up on a prn basis   Paul Ligas PA-C [facility] [place] Office: [phone]  Clinic MD: Scot Dock

## 2020-09-19 DIAGNOSIS — N186 End stage renal disease: Secondary | ICD-10-CM | POA: Diagnosis not present

## 2020-09-19 DIAGNOSIS — E1129 Type 2 diabetes mellitus with other diabetic kidney complication: Secondary | ICD-10-CM | POA: Diagnosis not present

## 2020-09-19 DIAGNOSIS — Z992 Dependence on renal dialysis: Secondary | ICD-10-CM | POA: Diagnosis not present

## 2020-09-21 DIAGNOSIS — Z452 Encounter for adjustment and management of vascular access device: Secondary | ICD-10-CM | POA: Diagnosis not present

## 2020-09-21 DIAGNOSIS — N186 End stage renal disease: Secondary | ICD-10-CM | POA: Diagnosis not present

## 2020-09-21 DIAGNOSIS — Z992 Dependence on renal dialysis: Secondary | ICD-10-CM | POA: Diagnosis not present

## 2020-09-28 DIAGNOSIS — T82898A Other specified complication of vascular prosthetic devices, implants and grafts, initial encounter: Secondary | ICD-10-CM | POA: Diagnosis not present

## 2020-09-28 DIAGNOSIS — Z992 Dependence on renal dialysis: Secondary | ICD-10-CM | POA: Diagnosis not present

## 2020-09-28 DIAGNOSIS — N186 End stage renal disease: Secondary | ICD-10-CM | POA: Diagnosis not present

## 2020-09-28 DIAGNOSIS — I871 Compression of vein: Secondary | ICD-10-CM | POA: Diagnosis not present

## 2020-10-20 DIAGNOSIS — Z992 Dependence on renal dialysis: Secondary | ICD-10-CM | POA: Diagnosis not present

## 2020-10-20 DIAGNOSIS — E1129 Type 2 diabetes mellitus with other diabetic kidney complication: Secondary | ICD-10-CM | POA: Diagnosis not present

## 2020-10-20 DIAGNOSIS — N186 End stage renal disease: Secondary | ICD-10-CM | POA: Diagnosis not present

## 2020-11-19 DIAGNOSIS — E1129 Type 2 diabetes mellitus with other diabetic kidney complication: Secondary | ICD-10-CM | POA: Diagnosis not present

## 2020-11-19 DIAGNOSIS — Z992 Dependence on renal dialysis: Secondary | ICD-10-CM | POA: Diagnosis not present

## 2020-11-19 DIAGNOSIS — N186 End stage renal disease: Secondary | ICD-10-CM | POA: Diagnosis not present

## 2020-12-20 DIAGNOSIS — E1129 Type 2 diabetes mellitus with other diabetic kidney complication: Secondary | ICD-10-CM | POA: Diagnosis not present

## 2020-12-20 DIAGNOSIS — N186 End stage renal disease: Secondary | ICD-10-CM | POA: Diagnosis not present

## 2020-12-20 DIAGNOSIS — Z992 Dependence on renal dialysis: Secondary | ICD-10-CM | POA: Diagnosis not present

## 2021-01-20 DIAGNOSIS — Z992 Dependence on renal dialysis: Secondary | ICD-10-CM | POA: Diagnosis not present

## 2021-01-20 DIAGNOSIS — E1129 Type 2 diabetes mellitus with other diabetic kidney complication: Secondary | ICD-10-CM | POA: Diagnosis not present

## 2021-01-20 DIAGNOSIS — N186 End stage renal disease: Secondary | ICD-10-CM | POA: Diagnosis not present

## 2021-02-11 ENCOUNTER — Other Ambulatory Visit: Payer: Self-pay | Admitting: Family Medicine

## 2021-02-11 DIAGNOSIS — Z794 Long term (current) use of insulin: Secondary | ICD-10-CM

## 2021-02-11 DIAGNOSIS — E1129 Type 2 diabetes mellitus with other diabetic kidney complication: Secondary | ICD-10-CM

## 2021-02-16 ENCOUNTER — Other Ambulatory Visit: Payer: Self-pay | Admitting: Family Medicine

## 2021-02-16 DIAGNOSIS — E039 Hypothyroidism, unspecified: Secondary | ICD-10-CM

## 2021-02-19 ENCOUNTER — Telehealth: Payer: Self-pay

## 2021-02-19 DIAGNOSIS — Z992 Dependence on renal dialysis: Secondary | ICD-10-CM | POA: Diagnosis not present

## 2021-02-19 DIAGNOSIS — E1129 Type 2 diabetes mellitus with other diabetic kidney complication: Secondary | ICD-10-CM | POA: Diagnosis not present

## 2021-02-19 DIAGNOSIS — N186 End stage renal disease: Secondary | ICD-10-CM | POA: Diagnosis not present

## 2021-02-19 NOTE — Telephone Encounter (Signed)
Received phone call from pharmacist regarding permission to change manufacturer for levothyroxine. Current manufacturer is on back order. The switch will be from Alvogen to [employer].   Patient has follow up visit with PCP on 10/26. Spoke with preceptor, Dr. Erin Hearing and received okay for change.   Talbot Grumbling, RN

## 2021-03-11 ENCOUNTER — Other Ambulatory Visit: Payer: Self-pay | Admitting: Family Medicine

## 2021-03-17 ENCOUNTER — Ambulatory Visit (INDEPENDENT_AMBULATORY_CARE_PROVIDER_SITE_OTHER): Payer: Medicaid Other | Admitting: Student

## 2021-03-17 ENCOUNTER — Encounter: Payer: Self-pay | Admitting: Student

## 2021-03-17 ENCOUNTER — Other Ambulatory Visit: Payer: Self-pay

## 2021-03-17 VITALS — BP 142/80 | HR 94 | Ht 74.0 in | Wt 332.8 lb

## 2021-03-17 DIAGNOSIS — E039 Hypothyroidism, unspecified: Secondary | ICD-10-CM | POA: Diagnosis not present

## 2021-03-17 DIAGNOSIS — Z992 Dependence on renal dialysis: Secondary | ICD-10-CM

## 2021-03-17 DIAGNOSIS — I10 Essential (primary) hypertension: Secondary | ICD-10-CM | POA: Diagnosis not present

## 2021-03-17 DIAGNOSIS — Z794 Long term (current) use of insulin: Secondary | ICD-10-CM

## 2021-03-17 DIAGNOSIS — Z9114 Patient's other noncompliance with medication regimen: Secondary | ICD-10-CM

## 2021-03-17 DIAGNOSIS — E785 Hyperlipidemia, unspecified: Secondary | ICD-10-CM | POA: Diagnosis present

## 2021-03-17 DIAGNOSIS — Z91148 Patient's other noncompliance with medication regimen for other reason: Secondary | ICD-10-CM | POA: Insufficient documentation

## 2021-03-17 DIAGNOSIS — N186 End stage renal disease: Secondary | ICD-10-CM | POA: Diagnosis not present

## 2021-03-17 DIAGNOSIS — E1122 Type 2 diabetes mellitus with diabetic chronic kidney disease: Secondary | ICD-10-CM

## 2021-03-17 LAB — POCT GLYCOSYLATED HEMOGLOBIN (HGB A1C): HbA1c, POC (controlled diabetic range): 5.8 % (ref 0.0–7.0)

## 2021-03-17 NOTE — Progress Notes (Signed)
    SUBJECTIVE:   CHIEF COMPLAINT / HPI: Re-establishing care, medication refills  T2DM with ESRD TTS Followed outpatient with Dr. Edrick Oh, nephrology. He takes 15-20 Lantus BID. Since running out of prescription, he has been buying OTC insulin from [facility]. He does not have a glucose monitor at home, therefore he is unsure of glucose ranges. No hypoglycemic episodes.  Hypertension Takes Amlodipine on off-schedule days (MWF, Sunday). Does not monitor BP at home but says it is low after dialysis.  Barriers to medical care Patient is not currently employed and has transportation barriers. He says it is difficult going to dialysis so many days a week, which is why he has not followed up closer with our clinic/healthcare maintenance.   PERTINENT  PMH / PSH: Obesity, hypothyroidism, hyperlipidemia  OBJECTIVE:   BP (!) 142/80   Pulse 94   Ht 6\' 2"  (1.88 m)   Wt (!) 332 lb 12.8 oz (151 kg)   SpO2 100%   BMI 42.73 kg/m   General: Obese, comfortable, in no distress HEENT:Pupils PERRLA, EOMI CV: Regular rate and rhythm, no murmurs appreciated Resp: CTAB in all fields. Normal work of breathing on room air Extremities: Warm, dry, well-perfused, no extremity edema  ASSESSMENT/PLAN:   Hyperlipidemia Ordered lipid panel today, anticipate elevation of LDL/cholesterol given that he has not taken Crestor in ~1 month. Refilled Crestor 20mg .   Type 2 diabetes mellitus (HCC) A1c 5.8% today. Likely falsely low 2/2 ESRD and received HD yesterday. He does not check blood glucose at home as he does not have a monitor, sent in script for glucose monitor and will have patient record CBG 2x daily and bring in journal at visit in 3-4 weeks. No episodes of hypoglycemia. - Continue Lantus 15u BID for now - Would like for f/u with Dr. Hughes Better for further diabetes management. Patient is amenable to this.  Poor compliance with medication Patient has not been seen in clinic since 2021. Goal of  today's appointment was to establish rapport with patient and gauge level of interest in his health. He requested Crestor, Lantus, Amlodipine, and Synthroid by name for refill. He says he has been busy and has some level of burnout from having to go to dialysis sessions 3x/week. He has financial and transportation barriers to receiving healthcare. Even though he did not receive medications until today, he bought OTC insulin and verbalized that he did so to prevent adverse outcome indicating he does see importance of adhering to essential medications. I discussed that through shared decision-making and close follow up, we can work together to meet his healthcare goals. He understands that he is young and already having poor outcomes from uncontrolled T2DM and says he does not want any further complications. - Follow-up in 3-4 weeks, will slowly work with patient at his level of engagement to incorporate healthcare management including diabetic eye exams, improving insulin regimen, medication compliance  Hypertension BP 142/80 today. Goal: <140/90 per ADA recommendations. Refilled Amlodipine 5mg  today.     Orvis Brill, [place]

## 2021-03-17 NOTE — Patient Instructions (Addendum)
It was great to meet you today.   Our plans for today:  - Resume the following medications: Amlodipine, Crestor, Lantus, Synthroid - Follow-up in 3-4 weeks.  We are checking some labs today, I will call you if they are abnormal will send you a MyChart message or a letter if they are normal.  If you do not hear about your labs in the next 2 weeks please let us know.  Take care and seek immediate care sooner if you develop any concerns.   Dr. Orvis Brill, [place]

## 2021-03-17 NOTE — Assessment & Plan Note (Signed)
Ordered lipid panel today, anticipate elevation of LDL/cholesterol given that he has not taken Crestor in ~1 month. Refilled Crestor 20mg .

## 2021-03-17 NOTE — Assessment & Plan Note (Addendum)
A1c 5.8% today. Likely falsely low 2/2 ESRD and received HD yesterday. He does not check blood glucose at home as he does not have a monitor, sent in script for glucose monitor and will have patient record CBG 2x daily and bring in journal at visit in 3-4 weeks. No episodes of hypoglycemia. - Continue Lantus 15u BID for now - Would like for f/u with Dr. Hughes Better for further diabetes management. Patient is amenable to this.

## 2021-03-17 NOTE — Assessment & Plan Note (Addendum)
BP 142/80 today. Goal: <140/90 per ADA recommendations. Refilled Amlodipine 5mg  today.

## 2021-03-17 NOTE — Assessment & Plan Note (Signed)
Patient has not been seen in clinic since 2021. Goal of today's appointment was to establish rapport with patient and gauge level of interest in his health. He requested Crestor, Lantus, Amlodipine, and Synthroid by name for refill. He says he has been busy and has some level of burnout from having to go to dialysis sessions 3x/week. He has financial and transportation barriers to receiving healthcare. Even though he did not receive medications until today, he bought OTC insulin and verbalized that he did so to prevent adverse outcome indicating he does see importance of adhering to essential medications. I discussed that through shared decision-making and close follow up, we can work together to meet his healthcare goals. He understands that he is young and already having poor outcomes from uncontrolled T2DM and says he does not want any further complications. - Follow-up in 3-4 weeks, will slowly work with patient at his level of engagement to incorporate healthcare management including diabetic eye exams, improving insulin regimen, medication compliance

## 2021-03-18 LAB — LIPID PANEL
Chol/HDL Ratio: 6.7 ratio — ABNORMAL HIGH (ref 0.0–5.0)
Cholesterol, Total: 228 mg/dL — ABNORMAL HIGH (ref 100–199)
HDL: 34 mg/dL — ABNORMAL LOW (ref >39)
LDL Chol Calc (NIH): 127 mg/dL — ABNORMAL HIGH (ref 0–99)
Triglycerides: 374 mg/dL — ABNORMAL HIGH (ref 0–149)
VLDL Cholesterol Cal: 67 mg/dL — ABNORMAL HIGH (ref 5–40)

## 2021-03-18 LAB — TSH: TSH: 5.25 u[IU]/mL — ABNORMAL HIGH (ref 0.450–4.500)

## 2021-03-19 ENCOUNTER — Other Ambulatory Visit: Payer: Self-pay | Admitting: Family Medicine

## 2021-03-19 DIAGNOSIS — E1129 Type 2 diabetes mellitus with other diabetic kidney complication: Secondary | ICD-10-CM

## 2021-03-19 DIAGNOSIS — E039 Hypothyroidism, unspecified: Secondary | ICD-10-CM

## 2021-03-22 DIAGNOSIS — N186 End stage renal disease: Secondary | ICD-10-CM | POA: Diagnosis not present

## 2021-03-22 DIAGNOSIS — E1129 Type 2 diabetes mellitus with other diabetic kidney complication: Secondary | ICD-10-CM | POA: Diagnosis not present

## 2021-03-22 DIAGNOSIS — Z992 Dependence on renal dialysis: Secondary | ICD-10-CM | POA: Diagnosis not present

## 2021-03-24 DIAGNOSIS — I871 Compression of vein: Secondary | ICD-10-CM | POA: Diagnosis not present

## 2021-03-24 DIAGNOSIS — N186 End stage renal disease: Secondary | ICD-10-CM | POA: Diagnosis not present

## 2021-03-24 DIAGNOSIS — T82858A Stenosis of vascular prosthetic devices, implants and grafts, initial encounter: Secondary | ICD-10-CM | POA: Diagnosis not present

## 2021-03-24 DIAGNOSIS — Z992 Dependence on renal dialysis: Secondary | ICD-10-CM | POA: Diagnosis not present

## 2021-03-25 ENCOUNTER — Other Ambulatory Visit (HOSPITAL_COMMUNITY): Payer: Self-pay | Admitting: Nephrology

## 2021-03-25 DIAGNOSIS — T82868D Thrombosis of vascular prosthetic devices, implants and grafts, subsequent encounter: Secondary | ICD-10-CM | POA: Diagnosis not present

## 2021-03-25 DIAGNOSIS — N186 End stage renal disease: Secondary | ICD-10-CM

## 2021-03-25 DIAGNOSIS — Z992 Dependence on renal dialysis: Secondary | ICD-10-CM | POA: Diagnosis not present

## 2021-03-26 ENCOUNTER — Telehealth (HOSPITAL_COMMUNITY): Payer: Self-pay

## 2021-03-26 ENCOUNTER — Other Ambulatory Visit: Payer: Self-pay | Admitting: Student

## 2021-03-26 ENCOUNTER — Other Ambulatory Visit: Payer: Self-pay

## 2021-03-26 ENCOUNTER — Other Ambulatory Visit (HOSPITAL_COMMUNITY): Payer: Self-pay | Admitting: Nephrology

## 2021-03-26 ENCOUNTER — Ambulatory Visit (HOSPITAL_COMMUNITY)
Admission: RE | Admit: 2021-03-26 | Discharge: 2021-03-26 | Disposition: A | Discharge status: Home / Self Care | Payer: Medicaid Other | Source: Ambulatory Visit | Attending: Nephrology | Admitting: Nephrology

## 2021-03-26 ENCOUNTER — Ambulatory Visit (HOSPITAL_COMMUNITY): Payer: Medicaid Other

## 2021-03-26 DIAGNOSIS — Z992 Dependence on renal dialysis: Secondary | ICD-10-CM | POA: Insufficient documentation

## 2021-03-26 DIAGNOSIS — Z888 Allergy status to other drugs, medicaments and biological substances status: Secondary | ICD-10-CM | POA: Diagnosis not present

## 2021-03-26 DIAGNOSIS — E785 Hyperlipidemia, unspecified: Secondary | ICD-10-CM | POA: Insufficient documentation

## 2021-03-26 DIAGNOSIS — I120 Hypertensive chronic kidney disease with stage 5 chronic kidney disease or end stage renal disease: Secondary | ICD-10-CM | POA: Diagnosis not present

## 2021-03-26 DIAGNOSIS — T82590A Other mechanical complication of surgically created arteriovenous fistula, initial encounter: Secondary | ICD-10-CM | POA: Diagnosis not present

## 2021-03-26 DIAGNOSIS — Z4901 Encounter for fitting and adjustment of extracorporeal dialysis catheter: Secondary | ICD-10-CM | POA: Diagnosis not present

## 2021-03-26 DIAGNOSIS — Z8249 Family history of ischemic heart disease and other diseases of the circulatory system: Secondary | ICD-10-CM | POA: Diagnosis not present

## 2021-03-26 DIAGNOSIS — Z79890 Hormone replacement therapy (postmenopausal): Secondary | ICD-10-CM | POA: Diagnosis not present

## 2021-03-26 DIAGNOSIS — N186 End stage renal disease: Secondary | ICD-10-CM

## 2021-03-26 DIAGNOSIS — E039 Hypothyroidism, unspecified: Secondary | ICD-10-CM | POA: Diagnosis not present

## 2021-03-26 DIAGNOSIS — Z79899 Other long term (current) drug therapy: Secondary | ICD-10-CM | POA: Insufficient documentation

## 2021-03-26 DIAGNOSIS — Z6841 Body Mass Index (BMI) 40.0 and over, adult: Secondary | ICD-10-CM | POA: Diagnosis not present

## 2021-03-26 DIAGNOSIS — N179 Acute kidney failure, unspecified: Secondary | ICD-10-CM | POA: Insufficient documentation

## 2021-03-26 DIAGNOSIS — Z95828 Presence of other vascular implants and grafts: Secondary | ICD-10-CM | POA: Diagnosis not present

## 2021-03-26 DIAGNOSIS — E1122 Type 2 diabetes mellitus with diabetic chronic kidney disease: Secondary | ICD-10-CM | POA: Insufficient documentation

## 2021-03-26 HISTORY — PX: IR US GUIDE VASC ACCESS RIGHT: IMG2390

## 2021-03-26 HISTORY — PX: IR FLUORO GUIDE CV LINE RIGHT: IMG2283

## 2021-03-26 LAB — BASIC METABOLIC PANEL
Anion gap: 19 — ABNORMAL HIGH (ref 5–15)
BUN: 48 mg/dL — ABNORMAL HIGH (ref 6–20)
CO2: 25 mmol/L (ref 22–32)
Calcium: 8 mg/dL — ABNORMAL LOW (ref 8.9–10.3)
Chloride: 93 mmol/L — ABNORMAL LOW (ref 98–111)
Creatinine, Ser: 17.86 mg/dL — ABNORMAL HIGH (ref 0.61–1.24)
GFR, Estimated: 3 mL/min — ABNORMAL LOW (ref >60)
Glucose, Bld: 87 mg/dL (ref 70–99)
Potassium: 3.4 mmol/L — ABNORMAL LOW (ref 3.5–5.1)
Sodium: 137 mmol/L (ref 135–145)

## 2021-03-26 LAB — GLUCOSE, CAPILLARY: Glucose-Capillary: 89 mg/dL (ref 70–99)

## 2021-03-26 MED ORDER — CEFAZOLIN SODIUM-DEXTROSE 2-4 GM/100ML-% IV SOLN: 2.0000 g | INTRAVENOUS | Status: AC

## 2021-03-26 MED ORDER — LIDOCAINE HCL 1 % IJ SOLN
INTRAMUSCULAR | Status: AC
  Administered 2021-03-26: 10 mL
  Filled 2021-03-26: qty 20

## 2021-03-26 MED ORDER — FENTANYL CITRATE (PF) 100 MCG/2ML IJ SOLN
INTRAMUSCULAR | Status: AC
  Filled 2021-03-26: qty 4

## 2021-03-26 MED ORDER — CEFAZOLIN SODIUM-DEXTROSE 2-4 GM/100ML-% IV SOLN
INTRAVENOUS | Status: AC
  Administered 2021-03-26: 2 g via INTRAVENOUS
  Filled 2021-03-26: qty 100

## 2021-03-26 MED ORDER — FENTANYL CITRATE (PF) 100 MCG/2ML IJ SOLN
INTRAMUSCULAR | Status: AC | PRN
  Administered 2021-03-26 (×2): 25 ug via INTRAVENOUS

## 2021-03-26 MED ORDER — SODIUM CHLORIDE 0.9 % IV SOLN: INTRAVENOUS | Status: DC

## 2021-03-26 MED ORDER — GELATIN ABSORBABLE 12-7 MM EX MISC
CUTANEOUS | Status: AC
  Filled 2021-03-26: qty 1

## 2021-03-26 MED ORDER — MIDAZOLAM HCL 2 MG/2ML IJ SOLN
INTRAMUSCULAR | Status: AC | PRN
  Administered 2021-03-26: 1 mg via INTRAVENOUS

## 2021-03-26 MED ORDER — HEPARIN SODIUM (PORCINE) 1000 UNIT/ML IJ SOLN
INTRAMUSCULAR | Status: AC
  Filled 2021-03-26: qty 1

## 2021-03-26 MED ORDER — MIDAZOLAM HCL 2 MG/2ML IJ SOLN
INTRAMUSCULAR | Status: AC
  Filled 2021-03-26: qty 4

## 2021-03-26 NOTE — H&P (Signed)
Chief Complaint: Patient was seen in consultation today for tunneled dialysis catheter   Referring Physician(s): Webb,Martin  Supervising Physician: Corrie Mckusick  Patient Status: [facility] - Out-pt  History of Present Illness: Paul [name] is a 30 y.o. male with a medical history significant for DM2, HTN, morbid obesity and ESRD on hemodialysis currently through a right upper extremity graft which was created by Vascular surgery earlier this year. He has a history of a left upper arm fistula and it is not functional.  His right upper extremity graft is currently not-functioning well (last treatment was 03/23/21) and Interventional Radiology has been asked to evaluate this patient for a tunneled dialysis catheter to facilitate his hemodialysis treatments until the Vascular team is able to get his graft functioning again.   Past Medical History:  Diagnosis Date  . Acute hypoxemic respiratory failure ([place]) 09/11/2016  . Anemia   . Chronic kidney disease    ARF on CRF Dialysis T/TH/Sa  . Diabetes mellitus    Type II  . End stage renal disease on dialysis [facility])    [facility]   . GERD (gastroesophageal reflux disease)    diet controlled  . HLD (hyperlipidemia)   . Hypertension   . Hypothyroidism   . Morbid obesity ([place])   . Sacral wound    resolved  . Thyroid disease     Past Surgical History:  Procedure Laterality Date  . AV FISTULA PLACEMENT Left 09/26/2016   Procedure: LEFT UPPER ARM ARTERIOVENOUS (AV) FISTULA CREATION;  Surgeon: Elam Dutch, MD;  Location: [facility] OR;  Service: Vascular;  Laterality: Left;  . AV FISTULA PLACEMENT Left 12/02/2016   Procedure: INSERTION OF ARTERIOVENOUS GORE-TEX GRAFT LEFT UPPER  ARM USING A 4-7MM BY 45CM GRAFT ;  Surgeon: Rosetta Posner, MD;  Location: [facility] OR;  Service: Vascular;  Laterality: Left;  . AV FISTULA PLACEMENT Right 07/03/2020   Procedure: INSERTION OF ARTERIOVENOUS (AV) GORE-TEX GRAFT RIGHT UPPER ARM;  Surgeon: Marty Heck, MD;  Location: [place];  Service: Vascular;  Laterality: Right;  . BASCILIC VEIN TRANSPOSITION Right 05/06/2020   Procedure: FIRST STAGE RIGHT BASCILIC VEIN TRANSPOSITION;  Surgeon: Serafina Mitchell, MD;  Location: [place];  Service: Vascular;  Laterality: Right;  . EXCHANGE OF A DIALYSIS CATHETER Right 10/08/2016   Procedure: EXCHANGE OF A DIALYSIS CATHETER-RIGHT INTERNAL JUGULAR;  Surgeon: Conrad Hanley Hills, MD;  Location: [place];  Service: Vascular;  Laterality: Right;  . INSERTION OF DIALYSIS CATHETER Right 09/26/2016   Procedure: INSERTION OF DIALYSIS CATHETER - Right Internal Jugular Placement;  Surgeon: Elam Dutch, MD;  Location: [facility] OR;  Service: Vascular;  Laterality: Right;  . INSERTION OF DIALYSIS CATHETER Left 03/22/2018   Procedure: INSERTION OF DIALYSIS CATHETER;  Surgeon: Marty Heck, MD;  Location: [place];  Service: Vascular;  Laterality: Left;  . REVISON OF ARTERIOVENOUS FISTULA Left 03/22/2018   Procedure: LEFT UPPER EXTREMITY ARTERIOVENOUS  REVISION WITH GORE-TEX GRAFT.;  Surgeon: Marty Heck, MD;  Location: [place];  Service: Vascular;  Laterality: Left;  . THROMBECTOMY Left 08/26/2019   thrombectomy of LUA loop AVG  . THROMBECTOMY AND REVISION OF ARTERIOVENTOUS (AV) GORETEX  GRAFT Right 07/27/2020   Procedure: INSERTION OF RIGHT ARM LOOP GRAFT WITH EXCISION OF RIGHT ARM BRACHIAL AXILLARY GRAFT;  Surgeon: Marty Heck, MD;  Location: [place];  Service: Vascular;  Laterality: Right;  . VISCERAL ANGIOGRAPHY Right 06/17/2020   Procedure: CENTRAL VENO;  Surgeon: Cherre Robins, MD;  Location:  [place] INVASIVE CV LAB;  Service: Cardiovascular;  Laterality: Right;    Allergies: Hydralazine hcl  Medications: Prior to Admission medications   Medication Sig Start Date End Date Taking? Authorizing Provider  acetaminophen (TYLENOL) 500 MG tablet Take 1,000 mg by mouth every 6 (six) hours as needed (for pain.).   Yes [provider]  amLODipine  (NORVASC) 5 MG tablet Take 5 mg on nondialysis days. Patient taking differently: Take 5 mg by mouth every Monday, Wednesday, Friday, Saturday, and Sunday at 6 PM. Take 5 mg on nondialysis days. 03/07/20  Yes Wilber Oliphant, MD  cinacalcet (SENSIPAR) 60 MG tablet Take 60 mg by mouth every evening.   Yes [provider]  ferric citrate (AURYXIA) 1 GM 210 MG(Fe) tablet Take 420-840 mg by mouth See admin instructions. Take 4 tablets (840 mg) by mouth with each meals & take 2-3 tablets (420 mg-630 mg) by mouth with each snack.   Yes [provider]  LANTUS 100 UNIT/ML injection INJECT 0.1-0.15 MLS (10-15 UNITS) INTO THE SKIN 2 TIMES DAILY. PATIENT NEEDS APPOINTMENT 03/19/21  Yes Dameron, Luna Fuse, DO  levothyroxine (SYNTHROID) 75 MCG tablet TAKE 1 TABLET BY MOUTH ONCE DAILY BEFORE BREAKFAST 03/19/21  Yes Dameron, Luna Fuse, DO  multivitamin (RENA-VIT) TABS tablet Take 1 tablet by mouth at bedtime. Patient taking differently: Take 1 tablet by mouth every evening. 10/25/16  Yes Nicolette Bang, MD  rosuvastatin (CRESTOR) 20 MG tablet Take 2 tablets by mouth once daily 03/12/21  Yes Dameron, Luna Fuse, DO  Accu-Chek FastClix Lancets MISC USE AS DIRECTED ONCE DAILY 06/01/20   Wilber Oliphant, MD  Blood Glucose Monitoring Suppl (ACCU-CHEK GUIDE ME) w/Device KIT 1 kit by Does not apply route daily. 02/05/19   Wilber Oliphant, MD  glucose blood (ACCU-CHEK GUIDE) test strip 1 each by Other route 2 (two) times daily. Use as instructed 02/05/19   Wilber Oliphant, MD  heparin 1000 unit/mL SOLN injection Heparin Sodium (Porcine) 1,000 Units/mL Systemic 08/15/20 08/14/21  [provider]  HYDROcodone-acetaminophen (NORCO) 5-325 MG tablet Take 1 tablet by mouth every 6 (six) hours as needed for moderate pain. 07/27/20   Dagoberto Ligas, PA-C  Syringe, Disposable, (B-D SYRINGE SLIP TIP 30CC) 30 ML MISC 1 Syringe by Does not apply route daily. 01/31/19   Wilber Oliphant, MD     Family History  Problem  Relation Age of Onset  . Heart disease Mother   . Hypertension Mother     Social History   Socioeconomic History  . Marital status: Single    Spouse name: Not on file  . Number of children: Not on file  . Years of education: Not on file  . Highest education level: Not on file  Occupational History  . Not on file  Tobacco Use  . Smoking status: Never  . Smokeless tobacco: Never  Vaping Use  . Vaping Use: Never used  Substance and Sexual Activity  . Alcohol use: No  . Drug use: Yes    Frequency: 2.0 times per week    Types: Marijuana    Comment: Last time 07/23/20  . Sexual activity: Not on file  Other Topics Concern  . Not on file  Social History Narrative  . Not on file   Social Determinants of Health   Financial Resource Strain: Not on file  Food Insecurity: Not on file  Transportation Needs: Not on file  Physical Activity: Not on file  Stress: Not on file  Social Connections: Not on  file    Review of Systems: A 12 point ROS discussed and pertinent positives are indicated in the HPI above.  All other systems are negative.  Review of Systems  Constitutional:  Negative for appetite change and fatigue.  Respiratory:  Negative for cough and shortness of breath.   Cardiovascular:  Negative for chest pain and leg swelling.  Gastrointestinal:  Negative for abdominal pain, diarrhea, nausea and vomiting.  Neurological:  Negative for dizziness and headaches.   Vital Signs: BP (!) 130/97 (BP Location: Left Wrist)   Pulse 78   Temp 98.2 F (36.8 C) (Oral)   Ht 6' 2"  (1.88 m)   Wt (!) 330 lb (149.7 kg)   SpO2 100%   BMI 42.37 kg/m   Physical Exam Constitutional:      General: He is not in acute distress.    Appearance: He is obese. He is not ill-appearing.  HENT:     Mouth/Throat:     Pharynx: Oropharynx is clear.  Cardiovascular:     Rate and Rhythm: Normal rate and regular rhythm.     Pulses: Normal pulses.     Heart sounds: Normal heart sounds.      Comments: Right upper arm graft negative for thrill and bruit. There are two suture bumpers in place from recent fistulogram done by the Vascular team.  Pulmonary:     Effort: Pulmonary effort is normal.     Breath sounds: Normal breath sounds.  Abdominal:     General: Bowel sounds are normal.     Palpations: Abdomen is soft.     Tenderness: There is no abdominal tenderness.  Musculoskeletal:     Right lower leg: No edema.     Left lower leg: No edema.  Skin:    General: Skin is warm and dry.  Neurological:     Mental Status: He is alert and oriented to person, place, and time.  Psychiatric:        Mood and Affect: Mood normal.        Behavior: Behavior normal.        Thought Content: Thought content normal.        Judgment: Judgment normal.    Imaging: No results found.  Labs:  CBC: Recent Labs    05/06/20 0904 06/17/20 0804 07/03/20 0620 07/27/20 1124  HGB 18.4* 16.3 17.0 15.6  HCT 54.0* 48.0 50.0 46.0    COAGS: No results for input(s): INR, APTT in the last 8760 hours.  BMP: Recent Labs    06/17/20 0804 07/03/20 0620 07/27/20 1124 03/26/21 1219  NA 137 138 139 137  K 4.4 3.7 4.1 3.4*  CL 99 97* 101 93*  CO2  --   --   --  25  GLUCOSE 100* 137* 99 87  BUN 58* 46* 55* 48*  CALCIUM  --   --   --  8.0*  CREATININE >18.00* 16.30* 17.00* 17.86*  GFRNONAA  --   --   --  3*    LIVER FUNCTION TESTS: No results for input(s): BILITOT, AST, ALT, ALKPHOS, PROT, ALBUMIN in the last 8760 hours.  TUMOR MARKERS: No results for input(s): AFPTM, CEA, CA199, CHROMGRNA in the last 8760 hours.  Assessment and Plan:  Malfunctioning right upper arm graft: Paul [name], 30 year old male, presents today to the [facility] department for an image-guided tunneled dialysis catheter.   Risks and benefits discussed with the patient including, but not limited to bleeding, infection, vascular injury, pneumothorax which may require  chest tube placement,  air embolism or even death  All of the patient's questions were answered, patient is agreeable to proceed. He has been NPO. Labs and vitals have been reviewed.   Consent signed and in chart.   Thank you for this interesting consult.  I greatly enjoyed meeting Paul [name] and look forward to participating in their care.  A copy of this report was sent to the requesting provider on this date.  Electronically Signed: Soyla Dryer, AGACNP-BC [phone] 03/26/2021, 1:57 PM   I spent a total of  30 Minutes   in face to face in clinical consultation, greater than 50% of which was counseling/coordinating care for tunneled dialysis catheter

## 2021-03-26 NOTE — Procedures (Signed)
Interventional Radiology Procedure Note  Procedure:   Image guided right EJ vein tunneled HD catheter, 23cm.     Complications: None  Recommendations:  - Ok to use - Do not submerge - Routine line care  - DC 1 hr  Signed,  Dulcy Fanny. Earleen Newport, DO

## 2021-03-26 NOTE — Telephone Encounter (Signed)
Spoke to patient this morning to inform him that his dialysis catheter placement will be done with moderate sedation. We do not do them under general anesthesia. I spoke with Dr. Earleen Newport who confirmed this. The patient was fine with moderate because he said he really needs the catheter placed. He wanted to know if his driver can wait in short stay with him. I let him know that they will need to wait in the waiting area and per Caryl Pina from short stay, the person can wait with him after his procedure. Pt was fine with this plan. AW

## 2021-04-21 DIAGNOSIS — N186 End stage renal disease: Secondary | ICD-10-CM | POA: Diagnosis not present

## 2021-04-21 DIAGNOSIS — Z992 Dependence on renal dialysis: Secondary | ICD-10-CM | POA: Diagnosis not present

## 2021-04-21 DIAGNOSIS — E1129 Type 2 diabetes mellitus with other diabetic kidney complication: Secondary | ICD-10-CM | POA: Diagnosis not present

## 2021-04-27 ENCOUNTER — Other Ambulatory Visit (HOSPITAL_COMMUNITY): Payer: Self-pay | Admitting: Nephrology

## 2021-04-27 DIAGNOSIS — N186 End stage renal disease: Secondary | ICD-10-CM

## 2021-04-28 ENCOUNTER — Other Ambulatory Visit: Payer: Self-pay

## 2021-04-28 DIAGNOSIS — N186 End stage renal disease: Secondary | ICD-10-CM

## 2021-04-29 ENCOUNTER — Other Ambulatory Visit: Payer: Self-pay | Admitting: Radiology

## 2021-05-03 ENCOUNTER — Encounter (HOSPITAL_COMMUNITY): Payer: Self-pay

## 2021-05-03 ENCOUNTER — Ambulatory Visit (HOSPITAL_COMMUNITY): Admission: RE | Admit: 2021-05-03 | Payer: Medicaid Other | Source: Ambulatory Visit

## 2021-05-06 ENCOUNTER — Inpatient Hospital Stay (HOSPITAL_BASED_OUTPATIENT_CLINIC_OR_DEPARTMENT_OTHER)
Admission: EM | Admit: 2021-05-06 | Discharge: 2021-05-14 | DRG: 853 | Disposition: A | Discharge status: Home / Self Care | Payer: Medicaid Other | Attending: Internal Medicine | Admitting: Internal Medicine

## 2021-05-06 ENCOUNTER — Other Ambulatory Visit: Payer: Self-pay

## 2021-05-06 ENCOUNTER — Encounter (HOSPITAL_BASED_OUTPATIENT_CLINIC_OR_DEPARTMENT_OTHER): Payer: Self-pay | Admitting: *Deleted

## 2021-05-06 ENCOUNTER — Emergency Department (HOSPITAL_BASED_OUTPATIENT_CLINIC_OR_DEPARTMENT_OTHER): Payer: Medicaid Other

## 2021-05-06 DIAGNOSIS — E1140 Type 2 diabetes mellitus with diabetic neuropathy, unspecified: Secondary | ICD-10-CM | POA: Diagnosis present

## 2021-05-06 DIAGNOSIS — I82210 Acute embolism and thrombosis of superior vena cava: Secondary | ICD-10-CM | POA: Diagnosis present

## 2021-05-06 DIAGNOSIS — B9561 Methicillin susceptible Staphylococcus aureus infection as the cause of diseases classified elsewhere: Secondary | ICD-10-CM

## 2021-05-06 DIAGNOSIS — E11628 Type 2 diabetes mellitus with other skin complications: Secondary | ICD-10-CM

## 2021-05-06 DIAGNOSIS — M869 Osteomyelitis, unspecified: Secondary | ICD-10-CM

## 2021-05-06 DIAGNOSIS — L0291 Cutaneous abscess, unspecified: Secondary | ICD-10-CM

## 2021-05-06 DIAGNOSIS — Z8249 Family history of ischemic heart disease and other diseases of the circulatory system: Secondary | ICD-10-CM

## 2021-05-06 DIAGNOSIS — R7881 Bacteremia: Secondary | ICD-10-CM

## 2021-05-06 DIAGNOSIS — Z6841 Body Mass Index (BMI) 40.0 and over, adult: Secondary | ICD-10-CM

## 2021-05-06 DIAGNOSIS — Z992 Dependence on renal dialysis: Secondary | ICD-10-CM

## 2021-05-06 DIAGNOSIS — Z794 Long term (current) use of insulin: Secondary | ICD-10-CM

## 2021-05-06 DIAGNOSIS — E785 Hyperlipidemia, unspecified: Secondary | ICD-10-CM | POA: Diagnosis present

## 2021-05-06 DIAGNOSIS — I10 Essential (primary) hypertension: Secondary | ICD-10-CM | POA: Diagnosis present

## 2021-05-06 DIAGNOSIS — R000 Tachycardia, unspecified: Secondary | ICD-10-CM | POA: Diagnosis not present

## 2021-05-06 DIAGNOSIS — A4101 Sepsis due to Methicillin susceptible Staphylococcus aureus: Principal | ICD-10-CM | POA: Diagnosis present

## 2021-05-06 DIAGNOSIS — Z79890 Hormone replacement therapy (postmenopausal): Secondary | ICD-10-CM

## 2021-05-06 DIAGNOSIS — E1169 Type 2 diabetes mellitus with other specified complication: Secondary | ICD-10-CM | POA: Diagnosis present

## 2021-05-06 DIAGNOSIS — Z888 Allergy status to other drugs, medicaments and biological substances status: Secondary | ICD-10-CM

## 2021-05-06 DIAGNOSIS — E039 Hypothyroidism, unspecified: Secondary | ICD-10-CM | POA: Diagnosis present

## 2021-05-06 DIAGNOSIS — L089 Local infection of the skin and subcutaneous tissue, unspecified: Secondary | ICD-10-CM

## 2021-05-06 DIAGNOSIS — R6520 Severe sepsis without septic shock: Secondary | ICD-10-CM | POA: Diagnosis present

## 2021-05-06 DIAGNOSIS — A419 Sepsis, unspecified organism: Secondary | ICD-10-CM | POA: Diagnosis not present

## 2021-05-06 DIAGNOSIS — E119 Type 2 diabetes mellitus without complications: Secondary | ICD-10-CM

## 2021-05-06 DIAGNOSIS — N186 End stage renal disease: Secondary | ICD-10-CM

## 2021-05-06 DIAGNOSIS — Z79899 Other long term (current) drug therapy: Secondary | ICD-10-CM

## 2021-05-06 DIAGNOSIS — E11621 Type 2 diabetes mellitus with foot ulcer: Secondary | ICD-10-CM | POA: Diagnosis present

## 2021-05-06 DIAGNOSIS — I120 Hypertensive chronic kidney disease with stage 5 chronic kidney disease or end stage renal disease: Secondary | ICD-10-CM | POA: Diagnosis present

## 2021-05-06 DIAGNOSIS — E1122 Type 2 diabetes mellitus with diabetic chronic kidney disease: Secondary | ICD-10-CM | POA: Diagnosis present

## 2021-05-06 DIAGNOSIS — L03032 Cellulitis of left toe: Secondary | ICD-10-CM | POA: Diagnosis present

## 2021-05-06 DIAGNOSIS — D638 Anemia in other chronic diseases classified elsewhere: Secondary | ICD-10-CM | POA: Diagnosis present

## 2021-05-06 DIAGNOSIS — L97529 Non-pressure chronic ulcer of other part of left foot with unspecified severity: Secondary | ICD-10-CM | POA: Diagnosis present

## 2021-05-06 DIAGNOSIS — Z20822 Contact with and (suspected) exposure to covid-19: Secondary | ICD-10-CM | POA: Diagnosis present

## 2021-05-06 DIAGNOSIS — K219 Gastro-esophageal reflux disease without esophagitis: Secondary | ICD-10-CM | POA: Diagnosis present

## 2021-05-06 LAB — CBC WITH DIFFERENTIAL/PLATELET
Abs Immature Granulocytes: 0.11 10*3/uL — ABNORMAL HIGH (ref 0.00–0.07)
Basophils Absolute: 0.1 10*3/uL (ref 0.0–0.1)
Basophils Relative: 0 %
Eosinophils Absolute: 0.1 10*3/uL (ref 0.0–0.5)
Eosinophils Relative: 1 %
HCT: 43 % (ref 39.0–52.0)
Hemoglobin: 14.3 g/dL (ref 13.0–17.0)
Immature Granulocytes: 1 %
Lymphocytes Relative: 13 %
Lymphs Abs: 2.4 10*3/uL (ref 0.7–4.0)
MCH: 27.9 pg (ref 26.0–34.0)
MCHC: 33.3 g/dL (ref 30.0–36.0)
MCV: 84 fL (ref 80.0–100.0)
Monocytes Absolute: 1.7 10*3/uL — ABNORMAL HIGH (ref 0.1–1.0)
Monocytes Relative: 9 %
Neutro Abs: 15 10*3/uL — ABNORMAL HIGH (ref 1.7–7.7)
Neutrophils Relative %: 76 %
Platelets: 226 10*3/uL (ref 150–400)
RBC: 5.12 MIL/uL (ref 4.22–5.81)
RDW: 17.5 % — ABNORMAL HIGH (ref 11.5–15.5)
WBC: 19.5 10*3/uL — ABNORMAL HIGH (ref 4.0–10.5)
nRBC: 0 % (ref 0.0–0.2)

## 2021-05-06 LAB — PROTIME-INR
INR: 1.3 — ABNORMAL HIGH (ref 0.8–1.2)
Prothrombin Time: 15.8 seconds — ABNORMAL HIGH (ref 11.4–15.2)

## 2021-05-06 LAB — COMPREHENSIVE METABOLIC PANEL
ALT: 12 U/L (ref 0–44)
AST: 21 U/L (ref 15–41)
Albumin: 4.3 g/dL (ref 3.5–5.0)
Alkaline Phosphatase: 69 U/L (ref 38–126)
Anion gap: 17 — ABNORMAL HIGH (ref 5–15)
BUN: 30 mg/dL — ABNORMAL HIGH (ref 6–20)
CO2: 25 mmol/L (ref 22–32)
Calcium: 9.7 mg/dL (ref 8.9–10.3)
Chloride: 92 mmol/L — ABNORMAL LOW (ref 98–111)
Creatinine, Ser: 11.66 mg/dL — ABNORMAL HIGH (ref 0.61–1.24)
GFR, Estimated: 6 mL/min — ABNORMAL LOW (ref >60)
Glucose, Bld: 163 mg/dL — ABNORMAL HIGH (ref 70–99)
Potassium: 3.8 mmol/L (ref 3.5–5.1)
Sodium: 134 mmol/L — ABNORMAL LOW (ref 135–145)
Total Bilirubin: 0.7 mg/dL (ref 0.3–1.2)
Total Protein: 9.8 g/dL — ABNORMAL HIGH (ref 6.5–8.1)

## 2021-05-06 LAB — APTT: aPTT: 34 seconds (ref 24–36)

## 2021-05-06 LAB — RESP PANEL BY RT-PCR (FLU A&B, COVID) ARPGX2
Influenza A by PCR: NEGATIVE
Influenza B by PCR: NEGATIVE
SARS Coronavirus 2 by RT PCR: NEGATIVE

## 2021-05-06 LAB — LACTIC ACID, PLASMA
Lactic Acid, Venous: 2.3 mmol/L (ref 0.5–1.9)
Lactic Acid, Venous: 2 mmol/L (ref 0.5–1.9)

## 2021-05-06 LAB — CORTISOL: Cortisol, Plasma: 28.4 ug/dL

## 2021-05-06 MED ORDER — LACTATED RINGERS IV BOLUS (SEPSIS): 400.0000 mL | Freq: Once | INTRAVENOUS | Status: DC

## 2021-05-06 MED ORDER — LACTATED RINGERS IV BOLUS
250.0000 mL | Freq: Once | INTRAVENOUS | Status: AC
  Administered 2021-05-06: 250 mL via INTRAVENOUS

## 2021-05-06 MED ORDER — LACTATED RINGERS IV BOLUS
500.0000 mL | Freq: Once | INTRAVENOUS | Status: AC
  Administered 2021-05-06: 500 mL via INTRAVENOUS

## 2021-05-06 MED ORDER — SODIUM CHLORIDE 0.9 % IV BOLUS
250.0000 mL | Freq: Once | INTRAVENOUS | Status: AC
  Administered 2021-05-06: 250 mL via INTRAVENOUS

## 2021-05-06 MED ORDER — LACTATED RINGERS IV BOLUS (SEPSIS)
1000.0000 mL | Freq: Once | INTRAVENOUS | Status: DC
  Administered 2021-05-06: 1000 mL via INTRAVENOUS

## 2021-05-06 MED ORDER — LACTATED RINGERS IV BOLUS (SEPSIS): 1000.0000 mL | Freq: Once | INTRAVENOUS | Status: DC

## 2021-05-06 MED ORDER — METRONIDAZOLE 500 MG/100ML IV SOLN
500.0000 mg | Freq: Once | INTRAVENOUS | Status: AC
Start: 2021-05-06 — End: 2021-05-06
  Administered 2021-05-06: 500 mg via INTRAVENOUS
  Filled 2021-05-06: qty 100

## 2021-05-06 MED ORDER — ACETAMINOPHEN 500 MG PO TABS
1000.0000 mg | ORAL_TABLET | Freq: Once | ORAL | Status: AC
  Administered 2021-05-06: 1000 mg via ORAL
  Filled 2021-05-06: qty 2

## 2021-05-06 MED ORDER — CEFEPIME HCL 2 G IJ SOLR
2.0000 g | Freq: Once | INTRAMUSCULAR | Status: AC
  Administered 2021-05-06: 2 g via INTRAVENOUS
  Filled 2021-05-06: qty 2

## 2021-05-06 MED ORDER — LACTATED RINGERS IV SOLN: INTRAVENOUS | Status: DC

## 2021-05-06 MED ORDER — VANCOMYCIN HCL IN DEXTROSE 1-5 GM/200ML-% IV SOLN
1000.0000 mg | INTRAVENOUS | Status: AC
  Administered 2021-05-06 (×2): 1000 mg via INTRAVENOUS
  Filled 2021-05-06 (×2): qty 200

## 2021-05-06 MED ORDER — PIPERACILLIN-TAZOBACTAM IN DEX 2-0.25 GM/50ML IV SOLN: 2.2500 g | Freq: Once | INTRAVENOUS | Status: DC

## 2021-05-06 NOTE — ED Triage Notes (Signed)
Pt c/o left foot wound infection  x 1 month , pt is diabetic

## 2021-05-06 NOTE — ED Notes (Signed)
[facility] called for report, ETA 10 min.

## 2021-05-06 NOTE — Progress Notes (Signed)
[facility] following code sepsis °

## 2021-05-06 NOTE — Progress Notes (Deleted)
Office Note     CC:  ESRD Requesting Provider:  Orvis Brill, DO  HPI: Paul [name] is a {Handed:22697} handed 30 y.o. (24-Feb-1991) male with kidney disease who presents at the request of Orvis Brill, DO for permanent HD access. The patient has had multiple prior access procedures - See Below. Per pt, previous tunneled lines have been placed in ***. Current access is ***. Dialysis days are ***.   On exam, ***  Also has a toe wound  Access procedures:  Right axillary artery to axillary vein graft 07/27/20 Right Brachial artery to axillary vein graft 07/03/20 right 1st stage brachiobasilic on 38/02/1750 Left upper arm loop AV Gore-Tex graft 12/02/2016 left brachiocephalic fistula on 0/06/5850      The pt is *** on a statin for cholesterol management.  The pt is *** on a daily aspirin.   Other AC:  *** The pt is *** on medications for hypertension.   The pt is *** diabetic. Tobacco hx:  ***  Past Medical History:  Diagnosis Date   Acute hypoxemic respiratory failure ([place]) 09/11/2016   Anemia    Chronic kidney disease    ARF on CRF Dialysis T/TH/Sa   Diabetes mellitus    Type II   End stage renal disease on dialysis ([place])    [place]    GERD (gastroesophageal reflux disease)    diet controlled   HLD (hyperlipidemia)    Hypertension    Hypothyroidism    Morbid obesity ([place])    Sacral wound    resolved   Thyroid disease     Past Surgical History:  Procedure Laterality Date   AV FISTULA PLACEMENT Left 09/26/2016   Procedure: LEFT UPPER ARM ARTERIOVENOUS (AV) FISTULA CREATION;  Surgeon: Elam Dutch, MD;  Location: [place];  Service: Vascular;  Laterality: Left;   AV FISTULA PLACEMENT Left 12/02/2016   Procedure: INSERTION OF ARTERIOVENOUS GORE-TEX GRAFT LEFT UPPER  ARM USING A 4-7MM BY 45CM GRAFT ;  Surgeon: Rosetta Posner, MD;  Location: [place];  Service: Vascular;  Laterality: Left;   AV FISTULA PLACEMENT Right 07/03/2020   Procedure: INSERTION OF  ARTERIOVENOUS (AV) GORE-TEX GRAFT RIGHT UPPER ARM;  Surgeon: Marty Heck, MD;  Location: [place];  Service: Vascular;  Laterality: Right;   BASCILIC VEIN TRANSPOSITION Right 05/06/2020   Procedure: FIRST STAGE RIGHT [place];  Surgeon: Serafina Mitchell, MD;  Location: [place];  Service: Vascular;  Laterality: Right;   EXCHANGE OF A DIALYSIS CATHETER Right 10/08/2016   Procedure: EXCHANGE OF A DIALYSIS CATHETER-RIGHT INTERNAL JUGULAR;  Surgeon: Conrad Seguin, MD;  Location: [place];  Service: Vascular;  Laterality: Right;   INSERTION OF DIALYSIS CATHETER Right 09/26/2016   Procedure: INSERTION OF DIALYSIS CATHETER - Right Internal Jugular Placement;  Surgeon: Elam Dutch, MD;  Location: [place];  Service: Vascular;  Laterality: Right;   INSERTION OF DIALYSIS CATHETER Left 03/22/2018   Procedure: INSERTION OF DIALYSIS CATHETER;  Surgeon: Marty Heck, MD;  Location: [place];  Service: Vascular;  Laterality: Left;   IR FLUORO GUIDE CV LINE RIGHT  03/26/2021   IR US GUIDE VASC ACCESS RIGHT  03/26/2021   REVISON OF ARTERIOVENOUS FISTULA Left 03/22/2018   Procedure: LEFT UPPER EXTREMITY ARTERIOVENOUS  REVISION WITH GORE-TEX GRAFT.;  Surgeon: Marty Heck, MD;  [facility]: [facility];  Service: Vascular;  Laterality: Left;   THROMBECTOMY Left 08/26/2019   thrombectomy of LUA loop AVG   THROMBECTOMY AND REVISION OF ARTERIOVENTOUS (  AV) GORETEX  GRAFT Right 07/27/2020   Procedure: INSERTION OF RIGHT ARM LOOP GRAFT WITH EXCISION OF RIGHT ARM BRACHIAL AXILLARY GRAFT;  Surgeon: Marty Heck, MD;  Location: [place];  Service: Vascular;  Laterality: Right;   VISCERAL ANGIOGRAPHY Right 06/17/2020   Procedure: CENTRAL VENO;  Surgeon: Cherre Todd Jelinski, MD;  Location: [place] CV LAB;  Service: Cardiovascular;  Laterality: Right;    Social History   Socioeconomic History   Marital status: Single    Spouse name: Not on file   Number of children: Not on file   Years of  education: Not on file   Highest education level: Not on file  Occupational History   Not on file  Tobacco Use   Smoking status: Never   Smokeless tobacco: Never  Vaping Use   Vaping Use: Never used  Substance and Sexual Activity   Alcohol use: No   Drug use: Yes    Frequency: 2.0 times per week    Types: Marijuana    Comment: Last time 07/23/20   Sexual activity: Not on file  Other Topics Concern   Not on file  Social History Narrative   Not on file   Social Determinants of Health   Financial Resource Strain: Not on file  Food Insecurity: Not on file  Transportation Needs: Not on file  Physical Activity: Not on file  Stress: Not on file  Social Connections: Not on file  Intimate Partner Violence: Not on file   *** Family History  Problem Relation Age of Onset   Heart disease Mother    Hypertension Mother     No current facility-administered medications for this visit.   Current Outpatient Medications  Medication Sig Dispense Refill   Accu-Chek FastClix Lancets MISC USE AS DIRECTED ONCE DAILY 102 each 0   acetaminophen (TYLENOL) 500 MG tablet Take 1,000 mg by mouth every 6 (six) hours as needed (for pain.).     amLODipine (NORVASC) 5 MG tablet Take 5 mg on nondialysis days. (Patient taking differently: Take 5 mg by mouth 4 (four) times a week. Take 5 mg on Sun, Mon, Wed, and Fri (non-dialysis days)) 90 tablet 2   Blood Glucose Monitoring Suppl (ACCU-CHEK GUIDE ME) w/Device KIT 1 kit by Does not apply route daily. 1 kit 0   calcium carbonate (TUMS - DOSED IN MG ELEMENTAL CALCIUM) 500 MG chewable tablet Chew 1,000-1,500 mg by mouth daily as needed for indigestion or heartburn.     cinacalcet (SENSIPAR) 60 MG tablet Take 60 mg by mouth every other day.     ferric citrate (AURYXIA) 1 GM 210 MG(Fe) tablet Take 420-840 mg by mouth See admin instructions. Take 3-4 tablets (630 mg-840 mg) by mouth with each meals & take 1-2 tablets (210-420 mg) by mouth with each snack.      glucose blood (ACCU-CHEK GUIDE) test strip 1 each by Other route 2 (two) times daily. Use as instructed 100 each 12   heparin 1000 unit/mL SOLN injection Heparin Sodium (Porcine) 1,000 Units/mL Systemic     LANTUS 100 UNIT/ML injection INJECT 0.1-0.15 MLS (10-15 UNITS) INTO THE SKIN 2 TIMES DAILY. PATIENT NEEDS APPOINTMENT (Patient taking differently: Inject 15-20 Units into the skin 2 (two) times daily.) 10 mL 0   levothyroxine (SYNTHROID) 75 MCG tablet TAKE 1 TABLET BY MOUTH ONCE DAILY BEFORE BREAKFAST 30 tablet 0   multivitamin (RENA-VIT) TABS tablet Take 1 tablet by mouth at bedtime. (Patient taking differently: Take 1 tablet by mouth every evening.)  30 tablet 0   rosuvastatin (CRESTOR) 20 MG tablet Take 2 tablets by mouth once daily (Patient taking differently: Take 20 mg by mouth daily.) 90 tablet 0   Syringe, Disposable, (B-D SYRINGE SLIP TIP 30CC) 30 ML MISC 1 Syringe by Does not apply route daily. 30 each 11   Facility-Administered Medications Ordered in Other Visits  Medication Dose Route Frequency Provider Last Rate Last Admin   vancomycin (VANCOCIN) IVPB 1000 mg/200 mL premix  1,000 mg Intravenous Q1 Hr x 2 Autry, Kyla Balzarine, PA-C 200 mL/hr at 05/06/21 1728 1,000 mg at 05/06/21 1728    Allergies  Allergen Reactions   Hydralazine Hcl Other (See Comments)    DRUG-INDUCED LUPUS     REVIEW OF SYSTEMS:  *** _0  denotes positive finding, _1  denotes negative finding Cardiac  Comments:  Chest pain or chest pressure:    Shortness of breath upon exertion:    Short of breath when lying flat:    Irregular heart rhythm:        Vascular    Pain in calf, thigh, or hip brought on by ambulation:    Pain in feet at night that wakes you up from your sleep:     Blood clot in your veins:    Leg swelling:         Pulmonary    Oxygen at home:    Productive cough:     Wheezing:         Neurologic    Sudden weakness in arms or legs:     Sudden numbness in arms or legs:     Sudden onset  of difficulty speaking or slurred speech:    Temporary loss of vision in one eye:     Problems with dizziness:         Gastrointestinal    Blood in stool:     Vomited blood:         Genitourinary    Burning when urinating:     Blood in urine:        Psychiatric    Major depression:         Hematologic    Bleeding problems:    Problems with blood clotting too easily:        Skin    Rashes or ulcers:        Constitutional    Fever or chills:      PHYSICAL EXAMINATION:  There were no vitals filed for this visit.  General:  WDWN in NAD; vital signs documented above Gait: Not observed HENT: WNL, normocephalic Pulmonary: normal non-labored breathing , without Rales, rhonchi,  wheezing Cardiac: {Desc; regular/irreg:14544} HR, without  Murmurs {With/Without:20273} carotid bruit*** Abdomen: soft, NT, no masses Skin: {With/Without:20273} rashes Vascular Exam/Pulses:  Right Left  Radial {Exam; arterial pulse strength 0-4:30167} {Exam; arterial pulse strength 0-4:30167}  Ulnar {Exam; arterial pulse strength 0-4:30167} {Exam; arterial pulse strength 0-4:30167}  Femoral {Exam; arterial pulse strength 0-4:30167} {Exam; arterial pulse strength 0-4:30167}  Popliteal {Exam; arterial pulse strength 0-4:30167} {Exam; arterial pulse strength 0-4:30167}  DP {Exam; arterial pulse strength 0-4:30167} {Exam; arterial pulse strength 0-4:30167}  PT {Exam; arterial pulse strength 0-4:30167} {Exam; arterial pulse strength 0-4:30167}   Extremities: {With/Without:20273} ischemic changes, {With/Without:20273} Gangrene , {With/Without:20273} cellulitis; {With/Without:20273} open wounds;  Musculoskeletal: no muscle wasting or atrophy  Neurologic: A&O X 3;  No focal weakness or paresthesias are detected Psychiatric:  The pt has {Desc; normal/abnormal:11317::"Normal"} affect.   Non-Invasive Vascular Imaging:   ***  ASSESSMENT/PLAN:  Paul [name] is a 30 y.o. male who presents with  {KidneyDisease:19197::"end stage renal disease","chronic kidney disease stage ***"}  Based on vein mapping and examination, ***. I had an extensive discussion with this patient in regards to the nature of access surgery, including risk, benefits, and alternatives.   The patient is aware that the risks of access surgery include but are not limited to: bleeding, infection, steal syndrome, nerve damage, ischemic monomelic neuropathy, failure of access to mature, complications related to venous hypertension, and possible need for additional access procedures in the future. *** I discussed with the patient the nature of the staged access procedure, specifically the need for a second operation to transpose the first stage fistula if it matures adequately.   The patient has *** agreed to proceed with the above procedure which will be scheduled ***.  Broadus John, MD [facility] [phone]

## 2021-05-06 NOTE — Progress Notes (Addendum)
BRIEF HOSPITAL MEDICINE ACCEPTANCE NOTE  30 year old male with past medical history of end-stage renal disease on hemodialysis Tuesday Thursday Saturday, hypertension, hypothyroidism, hyperlipidemia who presented to [place] emergency department with complaints of left great toe wound with surrounding swelling and pain.  Upon evaluation in the emergency department patient was found to exhibit multiple SIRS criteria including a fever of 100.4 F, tachycardia and evidence of substantial hypotension with systolic blood pressures in the 80s while historically patient's baseline systolic blood pressures have been in the 150s.  Patient was initiated on intravenous antibiotic therapy with intravenous cefepime, vancomycin and metronidazole.  Patient was administered 1 L of isotonic fluid.   CT imaging of the left foot reveals no evidence of abscess or obvious osteomyelitis.    Patient continues to be hypotensive with systolic blood pressures in the 80s to 90s which continues to be at least 40-50 less than the patient's baseline systolic pressure.  ER provider reports the patient appears euvolemic however.  ER provider has voiced an appropriate concern about continued attempts at volume resuscitation considering patient is an uric with end-stage renal disease.  After initial discussion I brought up concerns for sepsis with septic shock and consider that volume resuscitative options are limited I have asked the ER provider to discuss potential acceptance to the PCCM service.  ER provider did end up speaking to PCCM and they declined taking the patient stating that they would not consider the patient unless 4.5 L of isotonic fluids have been administered based on the patient's body weight.  ER provider has voiced their continued apprehension to do this considering patient is anuretic with end-stage renal disease.  ER provider has contacted me once again and I have accepted to a progressive bed at [facility].    [facility]

## 2021-05-06 NOTE — ED Notes (Signed)
Total IV intake  LR - 1000 NS- 250 Meds- 700

## 2021-05-06 NOTE — ED Notes (Signed)
[facility] at bedside to transfer pt, pt stable for transfer

## 2021-05-06 NOTE — Sepsis Progress Note (Signed)
Full amount of fluids not given per sepsis protocol due to history of ESRD on dialysis.

## 2021-05-06 NOTE — Progress Notes (Signed)
Notified bedside nurse of need to draw repeat lactic acid. 

## 2021-05-06 NOTE — ED Provider Notes (Signed)
[facility] EMERGENCY DEPARTMENT Provider Note   CSN: [id number] Arrival date & time: 05/06/21  1517     History Chief Complaint  Patient presents with   Wound Infection    Paul [name] is a 30 y.o. male.  With past medical history of type 2 diabetes, end-stage renal disease on hemodialysis Tuesday, Thursday, Saturday, hypertension who presents to the emergency department with left great toe wound infection.  He states that he has had the wound his foot for months however started noticing redness, swelling and pain over the past 2 to 3 days.  He states that this morning he woke up with chills, nausea.  He denies any vomiting or diarrhea.  He states that he went to dialysis and then presented here to the emergency department.  He denies seeing podiatry for the foot wound.  He denies chest pain, shortness of breath, lightheadedness, dizziness, abdominal pain.  HPI     Past Medical History:  Diagnosis Date   Acute hypoxemic respiratory failure ([place]) 09/11/2016   Anemia    Chronic kidney disease    ARF on CRF Dialysis T/TH/Sa   Diabetes mellitus    Type II   End stage renal disease on dialysis ([place])    [place]    GERD (gastroesophageal reflux disease)    diet controlled   HLD (hyperlipidemia)    Hypertension    Hypothyroidism    Morbid obesity ([place])    Sacral wound    resolved   Thyroid disease     Patient Active Problem List   Diagnosis Date Noted   Poor compliance with medication 03/17/2021   Fluid overload, unspecified 26/83/4196   Complication of vascular dialysis catheter 03/23/2020   Other disorders of phosphorus metabolism 01/28/2020   Hypertension 01/08/2020   Hypocalcemia 10/29/2019   ESRD on dialysis ([place]) 11/16/2017   Pruritus, unspecified 04/04/2017   Unspecified protein-calorie malnutrition ([place]) 11/11/2016   Headache, unspecified 10/29/2016   Encounter for immunization 10/19/2016   Iron deficiency anemia, unspecified 10/10/2016    Anemia in chronic kidney disease 09/30/2016   Coagulation defect, unspecified ([place]) 09/30/2016   Pneumonia due to Pseudomonas ([place]) 09/30/2016   Secondary hyperparathyroidism of renal origin ([place]) 09/30/2016   Hypothyroidism 10/29/2009   Type 2 diabetes mellitus ([place]) 04/14/2009   Hyperlipidemia 04/14/2009   OBESITY, MORBID 04/14/2009   Essential hypertension, benign 04/14/2009    Past Surgical History:  Procedure Laterality Date   AV FISTULA PLACEMENT Left 09/26/2016   Procedure: LEFT UPPER ARM ARTERIOVENOUS (AV) FISTULA CREATION;  Surgeon: Elam Dutch, MD;  Location: [facility];  Service: Vascular;  Laterality: Left;   AV FISTULA PLACEMENT Left 12/02/2016   Procedure: INSERTION OF ARTERIOVENOUS GORE-TEX GRAFT LEFT UPPER  ARM USING A 4-7MM BY 45CM GRAFT ;  Surgeon: Rosetta Posner, MD;  Location: [facility];  Service: Vascular;  Laterality: Left;   AV FISTULA PLACEMENT Right 07/03/2020   Procedure: INSERTION OF ARTERIOVENOUS (AV) GORE-TEX GRAFT RIGHT UPPER ARM;  Surgeon: Marty Heck, MD;  Location: [facility];  Service: Vascular;  Laterality: Right;   BASCILIC VEIN TRANSPOSITION Right 05/06/2020   Procedure: FIRST STAGE RIGHT [place];  Surgeon: Serafina Mitchell, MD;  Location[facility];  Service: Vascular;  Laterality: Right;   EXCHANGE OF A DIALYSIS CATHETER Right 10/08/2016   Procedure: EXCHANGE OF A DIALYSIS CATHETER-RIGHT INTERNAL JUGULAR;  Surgeon: Conrad Haysi, MD;  Location[facility];  Service: Vascular;  Laterality: Right;   INSERTION OF DIALYSIS CATHETER Right  09/26/2016   Procedure: INSERTION OF DIALYSIS CATHETER - Right Internal Jugular Placement;  Surgeon: Elam Dutch, MD;  Location: [facility];  Service: Vascular;  Laterality: Right;   INSERTION OF DIALYSIS CATHETER Left 03/22/2018   Procedure: INSERTION OF DIALYSIS CATHETER;  Surgeon: Marty Heck, MD;  Location: [facility];  Service: Vascular;  Laterality: Left;   IR FLUORO GUIDE CV LINE RIGHT  03/26/2021   IR US  GUIDE VASC ACCESS RIGHT  03/26/2021   REVISON OF ARTERIOVENOUS FISTULA Left 03/22/2018   Procedure: LEFT UPPER EXTREMITY ARTERIOVENOUS  REVISION WITH GORE-TEX GRAFT.;  Surgeon: Marty Heck, MD;  [facility];  Service: Vascular;  Laterality: Left;   THROMBECTOMY Left 08/26/2019   thrombectomy of LUA loop AVG   THROMBECTOMY AND REVISION OF ARTERIOVENTOUS (AV) GORETEX  GRAFT Right 07/27/2020   Procedure: INSERTION OF RIGHT ARM LOOP GRAFT WITH EXCISION OF RIGHT ARM BRACHIAL AXILLARY GRAFT;  Surgeon: Marty Heck, MD;  Location: [facility];  Service: Vascular;  Laterality: Right;   VISCERAL ANGIOGRAPHY Right 06/17/2020   Procedure: CENTRAL VENO;  Surgeon: Cherre Robins, MD;  Location: [facility];  Service: Cardiovascular;  Laterality: Right;       Family History  Problem Relation Age of Onset   Heart disease Mother    Hypertension Mother     Social History   Tobacco Use   Smoking status: Never   Smokeless tobacco: Never  Vaping Use   Vaping Use: Never used  Substance Use Topics   Alcohol use: No   Drug use: Yes    Frequency: 2.0 times per week    Types: Marijuana    Comment: Last time 07/23/20    Home Medications Prior to Admission medications   Medication Sig Start Date End Date Taking? Authorizing Provider  Accu-Chek FastClix Lancets MISC USE AS DIRECTED ONCE DAILY 06/01/20   Wilber Oliphant, MD  acetaminophen (TYLENOL) 500 MG tablet Take 1,000 mg by mouth every 6 (six) hours as needed (for pain.).    [provider]  amLODipine (NORVASC) 5 MG tablet Take 5 mg on nondialysis days. Patient taking differently: Take 5 mg by mouth 4 (four) times a week. Take 5 mg on Sun, Mon, Wed, and Fri (non-dialysis days) 03/07/20   Wilber Oliphant, MD  Blood Glucose Monitoring Suppl (ACCU-CHEK GUIDE ME) w/Device KIT 1 kit by Does not apply route daily. 02/05/19   Wilber Oliphant, MD  calcium carbonate (TUMS - DOSED IN MG ELEMENTAL CALCIUM) 500 MG chewable tablet Chew  1,000-1,500 mg by mouth daily as needed for indigestion or heartburn.    [provider]  cinacalcet (SENSIPAR) 60 MG tablet Take 60 mg by mouth every other day.    [provider]  ferric citrate (AURYXIA) 1 GM 210 MG(Fe) tablet Take 420-840 mg by mouth See admin instructions. Take 3-4 tablets (630 mg-840 mg) by mouth with each meals & take 1-2 tablets (210-420 mg) by mouth with each snack.    [provider]  glucose blood (ACCU-CHEK GUIDE) test strip 1 each by Other route 2 (two) times daily. Use as instructed 02/05/19   Wilber Oliphant, MD  heparin 1000 unit/mL SOLN injection Heparin Sodium (Porcine) 1,000 Units/mL Systemic 08/15/20 08/14/21  [provider]  LANTUS 100 UNIT/ML injection INJECT 0.1-0.15 MLS (10-15 UNITS) INTO THE SKIN 2 TIMES DAILY. PATIENT NEEDS APPOINTMENT Patient taking differently: Inject 15-20 Units into the skin 2 (two) times daily. 03/19/21   Orvis Brill,  DO  levothyroxine (SYNTHROID) 75 MCG tablet TAKE 1 TABLET BY MOUTH ONCE DAILY BEFORE BREAKFAST 03/19/21   Dameron, Luna Fuse, DO  multivitamin (RENA-VIT) TABS tablet Take 1 tablet by mouth at bedtime. Patient taking differently: Take 1 tablet by mouth every evening. 10/25/16   Nicolette Bang, MD  rosuvastatin (CRESTOR) 20 MG tablet Take 2 tablets by mouth once daily Patient taking differently: Take 20 mg by mouth daily. 03/12/21   Dameron, Luna Fuse, DO  Syringe, Disposable, (B-D SYRINGE SLIP TIP 30CC) 30 ML MISC 1 Syringe by Does not apply route daily. 01/31/19   Wilber Oliphant, MD    Allergies    Hydralazine hcl  Review of Systems   Review of Systems  Constitutional:  Positive for appetite change, chills and fever.  Respiratory:  Negative for shortness of breath.   Cardiovascular:  Negative for chest pain.  Gastrointestinal:  Positive for nausea. Negative for abdominal pain and vomiting.  Genitourinary:  Negative for dysuria.  Musculoskeletal:  Positive for joint swelling.   Skin:  Positive for wound.  All other systems reviewed and are negative.  Physical Exam Updated Vital Signs BP (!) 84/69 (BP Location: Right Arm)    Pulse (!) 131    Temp (!) 100.4 F (38 C) (Oral)    Resp 16    Ht 6' 1"  (1.854 m)    Wt (!) 145 kg    SpO2 99%    BMI 42.17 kg/m   Physical Exam Vitals and nursing note reviewed.  Constitutional:      General: He is not in acute distress.    Appearance: Normal appearance. He is ill-appearing and toxic-appearing.  HENT:     Head: Normocephalic and atraumatic.     Mouth/Throat:     Mouth: Mucous membranes are moist.     Pharynx: Oropharynx is clear.  Eyes:     General: No scleral icterus.    Extraocular Movements: Extraocular movements intact.     Pupils: Pupils are equal, round, and reactive to light.  Cardiovascular:     Rate and Rhythm: Regular rhythm. Tachycardia present.     Pulses: Normal pulses.     Heart sounds: No murmur heard. Pulmonary:     Effort: Pulmonary effort is normal. No respiratory distress.     Breath sounds: Normal breath sounds.  Abdominal:     General: Bowel sounds are normal. There is no distension.     Palpations: Abdomen is soft.     Tenderness: There is no abdominal tenderness.  Musculoskeletal:        General: Swelling and tenderness present.     Cervical back: Normal range of motion.  Skin:    General: Skin is warm and dry.     Capillary Refill: Capillary refill takes less than 2 seconds.     Findings: Erythema and wound present. No rash.     Comments: 2 cm wound to the plantar surface of the left great toe.  Wound appears deep with local redness and swelling to the foot.  Neurological:     General: No focal deficit present.     Mental Status: He is alert and oriented to person, place, and time. Mental status is at baseline.  Psychiatric:        Mood and Affect: Mood normal.        Behavior: Behavior normal.        Thought Content: Thought content normal.        Judgment: Judgment normal.  ED Results / Procedures / Treatments   Labs (all labs ordered are listed, but only abnormal results are displayed) Labs Reviewed  CBC WITH DIFFERENTIAL/PLATELET - Abnormal; Notable for the following components:      Result Value   WBC 19.5 (*)    RDW 17.5 (*)    Neutro Abs 15.0 (*)    Monocytes Absolute 1.7 (*)    Abs Immature Granulocytes 0.11 (*)    All other components within normal limits  COMPREHENSIVE METABOLIC PANEL - Abnormal; Notable for the following components:   Sodium 134 (*)    Chloride 92 (*)    Glucose, Bld 163 (*)    BUN 30 (*)    Creatinine, Ser 11.66 (*)    Total Protein 9.8 (*)    GFR, Estimated 6 (*)    Anion gap 17 (*)    All other components within normal limits  LACTIC ACID, PLASMA - Abnormal; Notable for the following components:   Lactic Acid, Venous 2.3 (*)    All other components within normal limits  PROTIME-INR - Abnormal; Notable for the following components:   Prothrombin Time 15.8 (*)    INR 1.3 (*)    All other components within normal limits  RESP PANEL BY RT-PCR (FLU A&B, COVID) ARPGX2  CULTURE, BLOOD (ROUTINE X 2)  CULTURE, BLOOD (ROUTINE X 2)  APTT  LACTIC ACID, PLASMA  URINALYSIS, ROUTINE W REFLEX MICROSCOPIC   EKG EKG Interpretation  Date/Time:  Thursday May 06 2021 16:10:16 EST Ventricular Rate:  126 PR Interval:  148 QRS Duration: 90 QT Interval:  310 QTC Calculation: 449 R Axis:   44 Text Interpretation: Sinus tachycardia SINCE LAST TRACING HEART RATE HAS INCREASED Confirmed by Malvin Johns [phone]) on 05/06/2021 4:11:49 PM  Radiology CT Foot Left Wo Contrast  Result Date: 05/06/2021 CLINICAL DATA:  Foot wound infection.  Osteomyelitis suspected EXAM: CT OF THE LEFT FOOT WITHOUT CONTRAST TECHNIQUE: Multidetector CT imaging of the left foot was performed according to the standard protocol. Multiplanar CT image reconstructions were also generated. COMPARISON:  None. FINDINGS: Bones/Joint/Cartilage No cortical  erosion or periosteal reaction to suggest acute osteomyelitis. No evidence of fracture or dislocation Ligaments Suboptimally assessed by CT. Muscles and Tendons Muscles are normal in bulk and density. No intramuscular fluid collection. Soft tissues Skin thickening and marked subcutaneous soft tissue edema about the first digit. There is deep skin wound on the plantar aspect of the first digit about the interphalangeal joint. No drainable fluid collection or abscess on this noncontrast enhanced examination. IMPRESSION: 1. Deep skin wound about the plantar aspect of the interphalangeal joint of the first digit. Skin thickening and subcutaneous soft tissue edema consistent with cellulitis. No cortical erosion or periosteal reaction suggesting osteomyelitis, however early osteomyelitis can not be excluded on CT examination. Further evaluation with MRI examination is recommended. 2. No drainable fluid collection or abscess on this noncontrast enhanced examination. Electronically Signed   By: Keane Police D.O.   On: 05/06/2021 18:11   DG Chest Port 1 View  Result Date: 05/06/2021 CLINICAL DATA:  Possible septicemia EXAM: PORTABLE CHEST 1 VIEW COMPARISON:  Previous studies including the examination 1031 2019 FINDINGS: Transverse diameter of heart is in the upper limits of normal. Central pulmonary vessels are prominent. There are no signs of alveolar pulmonary edema or focal pulmonary consolidation. Right hemidiaphragm is elevated. There is crowding of markings in the right lower lung fields. There is no pleural effusion or pneumothorax. Tip dialysis catheter is seen at the  junction of superior vena cava and right atrium. IMPRESSION: There are no signs of pulmonary edema or focal pulmonary consolidation. Electronically Signed   By: Elmer Picker M.D.   On: 05/06/2021 16:38    Procedures .Critical Care Performed by: Mickie Hillier, PA-C Authorized by: Mickie Hillier, PA-C   Critical care provider  statement:    Critical care time (minutes):  40   Critical care time was exclusive of:  Separately billable procedures and treating other patients   Critical care was necessary to treat or prevent imminent or life-threatening deterioration of the following conditions:  Sepsis and shock   Critical care was time spent personally by me on the following activities:  Development of treatment plan with patient or surrogate, discussions with consultants, discussions with primary provider, evaluation of patient's response to treatment, examination of patient, interpretation of cardiac output measurements, obtaining history from patient or surrogate, review of old charts, re-evaluation of patient's condition, pulse oximetry, ordering and review of radiographic studies, ordering and review of laboratory studies and ordering and performing treatments and interventions   I assumed direction of critical care for this patient from another provider in my specialty: no     Care discussed with: admitting provider     Medications Ordered in ED Medications  vancomycin (VANCOCIN) IVPB 1000 mg/200 mL premix (1,000 mg Intravenous New Bag/Given 05/06/21 1900)  acetaminophen (TYLENOL) tablet 1,000 mg (has no administration in time range)  sodium chloride 0.9 % bolus 250 mL (has no administration in time range)  lactated ringers bolus 500 mL (500 mLs Intravenous New Bag/Given 05/06/21 1633)  ceFEPIme (MAXIPIME) 2 g in sodium chloride 0.9 % 100 mL IVPB (0 g Intravenous Stopped 05/06/21 1701)  metroNIDAZOLE (FLAGYL) IVPB 500 mg (0 mg Intravenous Stopped 05/06/21 1728)   ED Course  I have reviewed the triage vital signs and the nursing notes.  Pertinent labs & imaging results that were available during my care of the patient were reviewed by me and considered in my medical decision making (see chart for details).    MDM Rules/Calculators/A&P 30 year old male who presents to the emergency department with foot  infection.  Patient tachycardic, hypotensive with fever in triage.  Sepsis work-up initiated on presentation to the room. He does have wound to the left great toe that appears eroded.  Unable to visualize full extent wound so we will obtain CT imaging.  Leukocytosis to 19.5, febrile, hypotensive to 80s/60s He has a history of end-stage renal disease on dialysis Tuesday Thursday Saturday with creatinine of 11.66.  Will be cautious with fluid resuscitation.  Based on ideal body weight should received 2400 mL of fluid resuscitation.  We will gently give 500 at a time with reevaluation. -Blood cultures obtained prior to antibiotics -Empirically starting Vancomycin, cefepime, Flagyl -Does not make urine so doubt this is source of infection.  Chest x-ray without pneumonia, doubt this is source of infection.  Abdomen is nonperitoneal neck, no abdominal pain concerning for intra-abdominal source of infection. -Lactic 2.3  -CT of left foot showing deep skin wound around the plantar aspect of the first digit with cellulitis.  There is no cortical erosion or periosteal reaction suggesting osteomyelitis at this time.  Spoke with Dr. Marlyce Huge, MD, hospitalist who recommends consulting critical care prior to excepting patient.  He does recommend random cortisol drawl which I have added on. -Consulted critical care  Giving 1 g of Tylenol, 250 mL fluid over 1 hour given continued hypotension, tachycardia and fever. 2040: Spoke  with PCCM MD who does not agree to take patient at this time. He recommends continued fluid resuscitation up to 4.5L and if continues to be hypotensive requiring vasopressors, will accept then.  2045: Given need for cautious fluid resuscitation, and resuscitation based on ideal body weight, will continue with gentle fluid resuscitation. I have spoken again with Dr. Marlyce Huge who agrees to admit patient at this time. Will be transferred to [facility].  Final Clinical Impression(s) / ED  Diagnoses Final diagnoses:  Wound infection  Sepsis without acute organ dysfunction, due to unspecified organism [facility])    Rx / DC Orders ED Discharge Orders     None        Mickie Hillier, PA-C 05/06/21 2047    Malvin Johns, MD 05/06/21 2252

## 2021-05-07 ENCOUNTER — Encounter (HOSPITAL_COMMUNITY): Payer: Self-pay | Admitting: Internal Medicine

## 2021-05-07 ENCOUNTER — Inpatient Hospital Stay (HOSPITAL_COMMUNITY): Payer: Medicaid Other

## 2021-05-07 ENCOUNTER — Ambulatory Visit (HOSPITAL_COMMUNITY): Payer: Medicaid Other

## 2021-05-07 ENCOUNTER — Ambulatory Visit: Payer: Medicaid Other | Admitting: Vascular Surgery

## 2021-05-07 ENCOUNTER — Other Ambulatory Visit (HOSPITAL_COMMUNITY): Payer: Medicaid Other

## 2021-05-07 DIAGNOSIS — Z8249 Family history of ischemic heart disease and other diseases of the circulatory system: Secondary | ICD-10-CM | POA: Diagnosis not present

## 2021-05-07 DIAGNOSIS — N186 End stage renal disease: Secondary | ICD-10-CM | POA: Diagnosis not present

## 2021-05-07 DIAGNOSIS — B9561 Methicillin susceptible Staphylococcus aureus infection as the cause of diseases classified elsewhere: Secondary | ICD-10-CM

## 2021-05-07 DIAGNOSIS — E785 Hyperlipidemia, unspecified: Secondary | ICD-10-CM | POA: Diagnosis not present

## 2021-05-07 DIAGNOSIS — Z794 Long term (current) use of insulin: Secondary | ICD-10-CM | POA: Diagnosis not present

## 2021-05-07 DIAGNOSIS — Z888 Allergy status to other drugs, medicaments and biological substances status: Secondary | ICD-10-CM | POA: Diagnosis not present

## 2021-05-07 DIAGNOSIS — M79672 Pain in left foot: Secondary | ICD-10-CM | POA: Diagnosis not present

## 2021-05-07 DIAGNOSIS — N250 Renal osteodystrophy: Secondary | ICD-10-CM | POA: Diagnosis not present

## 2021-05-07 DIAGNOSIS — Z20822 Contact with and (suspected) exposure to covid-19: Secondary | ICD-10-CM | POA: Diagnosis not present

## 2021-05-07 DIAGNOSIS — E1122 Type 2 diabetes mellitus with diabetic chronic kidney disease: Secondary | ICD-10-CM | POA: Diagnosis not present

## 2021-05-07 DIAGNOSIS — E039 Hypothyroidism, unspecified: Secondary | ICD-10-CM | POA: Diagnosis not present

## 2021-05-07 DIAGNOSIS — I82210 Acute embolism and thrombosis of superior vena cava: Secondary | ICD-10-CM | POA: Diagnosis not present

## 2021-05-07 DIAGNOSIS — L0291 Cutaneous abscess, unspecified: Secondary | ICD-10-CM

## 2021-05-07 DIAGNOSIS — L97529 Non-pressure chronic ulcer of other part of left foot with unspecified severity: Secondary | ICD-10-CM | POA: Diagnosis not present

## 2021-05-07 DIAGNOSIS — A4101 Sepsis due to Methicillin susceptible Staphylococcus aureus: Secondary | ICD-10-CM | POA: Diagnosis not present

## 2021-05-07 DIAGNOSIS — M86272 Subacute osteomyelitis, left ankle and foot: Secondary | ICD-10-CM | POA: Diagnosis not present

## 2021-05-07 DIAGNOSIS — L089 Local infection of the skin and subcutaneous tissue, unspecified: Secondary | ICD-10-CM | POA: Diagnosis not present

## 2021-05-07 DIAGNOSIS — I120 Hypertensive chronic kidney disease with stage 5 chronic kidney disease or end stage renal disease: Secondary | ICD-10-CM | POA: Diagnosis not present

## 2021-05-07 DIAGNOSIS — M869 Osteomyelitis, unspecified: Secondary | ICD-10-CM | POA: Diagnosis not present

## 2021-05-07 DIAGNOSIS — E1140 Type 2 diabetes mellitus with diabetic neuropathy, unspecified: Secondary | ICD-10-CM | POA: Diagnosis not present

## 2021-05-07 DIAGNOSIS — E11621 Type 2 diabetes mellitus with foot ulcer: Secondary | ICD-10-CM | POA: Diagnosis not present

## 2021-05-07 DIAGNOSIS — Z992 Dependence on renal dialysis: Secondary | ICD-10-CM

## 2021-05-07 DIAGNOSIS — D638 Anemia in other chronic diseases classified elsewhere: Secondary | ICD-10-CM | POA: Diagnosis not present

## 2021-05-07 DIAGNOSIS — L0390 Cellulitis, unspecified: Secondary | ICD-10-CM

## 2021-05-07 DIAGNOSIS — A419 Sepsis, unspecified organism: Secondary | ICD-10-CM | POA: Diagnosis not present

## 2021-05-07 DIAGNOSIS — I10 Essential (primary) hypertension: Secondary | ICD-10-CM | POA: Diagnosis not present

## 2021-05-07 DIAGNOSIS — R7881 Bacteremia: Secondary | ICD-10-CM | POA: Diagnosis not present

## 2021-05-07 DIAGNOSIS — M86072 Acute hematogenous osteomyelitis, left ankle and foot: Secondary | ICD-10-CM

## 2021-05-07 DIAGNOSIS — R6520 Severe sepsis without septic shock: Secondary | ICD-10-CM | POA: Diagnosis not present

## 2021-05-07 DIAGNOSIS — E1169 Type 2 diabetes mellitus with other specified complication: Secondary | ICD-10-CM | POA: Diagnosis not present

## 2021-05-07 DIAGNOSIS — E11628 Type 2 diabetes mellitus with other skin complications: Secondary | ICD-10-CM | POA: Diagnosis not present

## 2021-05-07 DIAGNOSIS — Z79890 Hormone replacement therapy (postmenopausal): Secondary | ICD-10-CM | POA: Diagnosis not present

## 2021-05-07 DIAGNOSIS — Z6841 Body Mass Index (BMI) 40.0 and over, adult: Secondary | ICD-10-CM | POA: Diagnosis not present

## 2021-05-07 DIAGNOSIS — E1129 Type 2 diabetes mellitus with other diabetic kidney complication: Secondary | ICD-10-CM | POA: Diagnosis not present

## 2021-05-07 DIAGNOSIS — R000 Tachycardia, unspecified: Secondary | ICD-10-CM | POA: Diagnosis not present

## 2021-05-07 DIAGNOSIS — K219 Gastro-esophageal reflux disease without esophagitis: Secondary | ICD-10-CM | POA: Diagnosis not present

## 2021-05-07 LAB — BLOOD CULTURE ID PANEL (REFLEXED) - BCID2

## 2021-05-07 LAB — CBC
HCT: 36.8 % — ABNORMAL LOW (ref 39.0–52.0)
Hemoglobin: 11.9 g/dL — ABNORMAL LOW (ref 13.0–17.0)
MCH: 27.2 pg (ref 26.0–34.0)
MCHC: 32.3 g/dL (ref 30.0–36.0)
MCV: 84 fL (ref 80.0–100.0)
Platelets: 202 10*3/uL (ref 150–400)
RBC: 4.38 MIL/uL (ref 4.22–5.81)
RDW: 16.8 % — ABNORMAL HIGH (ref 11.5–15.5)
WBC: 18.2 10*3/uL — ABNORMAL HIGH (ref 4.0–10.5)
nRBC: 0 % (ref 0.0–0.2)

## 2021-05-07 LAB — HEMOGLOBIN A1C
Hgb A1c MFr Bld: 6 % — ABNORMAL HIGH (ref 4.8–5.6)
Mean Plasma Glucose: 125.5 mg/dL

## 2021-05-07 LAB — BASIC METABOLIC PANEL
Anion gap: 15 (ref 5–15)
BUN: 32 mg/dL — ABNORMAL HIGH (ref 6–20)
CO2: 23 mmol/L (ref 22–32)
Calcium: 9.2 mg/dL (ref 8.9–10.3)
Chloride: 94 mmol/L — ABNORMAL LOW (ref 98–111)
Creatinine, Ser: 13.15 mg/dL — ABNORMAL HIGH (ref 0.61–1.24)
GFR, Estimated: 5 mL/min — ABNORMAL LOW (ref >60)
Glucose, Bld: 158 mg/dL — ABNORMAL HIGH (ref 70–99)
Potassium: 3.2 mmol/L — ABNORMAL LOW (ref 3.5–5.1)
Sodium: 132 mmol/L — ABNORMAL LOW (ref 135–145)

## 2021-05-07 LAB — GLUCOSE, CAPILLARY
Glucose-Capillary: 176 mg/dL — ABNORMAL HIGH (ref 70–99)
Glucose-Capillary: 156 mg/dL — ABNORMAL HIGH (ref 70–99)
Glucose-Capillary: 126 mg/dL — ABNORMAL HIGH (ref 70–99)
Glucose-Capillary: 106 mg/dL — ABNORMAL HIGH (ref 70–99)
Glucose-Capillary: 131 mg/dL — ABNORMAL HIGH (ref 70–99)

## 2021-05-07 LAB — HIV ANTIBODY (ROUTINE TESTING W REFLEX): HIV Screen 4th Generation wRfx: NONREACTIVE

## 2021-05-07 LAB — HEPATITIS B SURFACE ANTIBODY,QUALITATIVE: Hep B S Ab: REACTIVE — AB

## 2021-05-07 LAB — LACTIC ACID, PLASMA
Lactic Acid, Venous: 1 mmol/L (ref 0.5–1.9)
Lactic Acid, Venous: 1.2 mmol/L (ref 0.5–1.9)

## 2021-05-07 LAB — HEPATITIS B SURFACE ANTIGEN: Hepatitis B Surface Ag: NONREACTIVE

## 2021-05-07 MED ORDER — CEFAZOLIN SODIUM-DEXTROSE 2-4 GM/100ML-% IV SOLN
2.0000 g | INTRAVENOUS | Status: DC
  Administered 2021-05-08 – 2021-05-13 (×4): 2 g via INTRAVENOUS
  Filled 2021-05-07 (×5): qty 100

## 2021-05-07 MED ORDER — ONDANSETRON HCL 4 MG/2ML IJ SOLN: 4.0000 mg | Freq: Four times a day (QID) | INTRAMUSCULAR | Status: DC | PRN

## 2021-05-07 MED ORDER — CALCIUM CARBONATE ANTACID 500 MG PO CHEW
1.0000 | CHEWABLE_TABLET | Freq: Three times a day (TID) | ORAL | Status: DC | PRN
  Administered 2021-05-07: 200 mg via ORAL
  Filled 2021-05-07: qty 1

## 2021-05-07 MED ORDER — MELATONIN 3 MG PO TABS
3.0000 mg | ORAL_TABLET | Freq: Every day | ORAL | Status: DC
  Administered 2021-05-07 – 2021-05-08 (×2): 3 mg via ORAL
  Filled 2021-05-07 (×5): qty 1

## 2021-05-07 MED ORDER — ACETAMINOPHEN 325 MG PO TABS
650.0000 mg | ORAL_TABLET | Freq: Four times a day (QID) | ORAL | Status: DC | PRN
  Administered 2021-05-07 (×2): 650 mg via ORAL
  Filled 2021-05-07 (×2): qty 2

## 2021-05-07 MED ORDER — SENNOSIDES-DOCUSATE SODIUM 8.6-50 MG PO TABS
1.0000 | ORAL_TABLET | Freq: Every evening | ORAL | Status: DC | PRN
  Administered 2021-05-14: 09:00:00 1 via ORAL
  Filled 2021-05-07: qty 1

## 2021-05-07 MED ORDER — VANCOMYCIN HCL IN DEXTROSE 1-5 GM/200ML-% IV SOLN: 1000.0000 mg | INTRAVENOUS | Status: DC

## 2021-05-07 MED ORDER — ACETAMINOPHEN 650 MG RE SUPP: 650.0000 mg | Freq: Four times a day (QID) | RECTAL | Status: DC | PRN

## 2021-05-07 MED ORDER — SODIUM CHLORIDE 0.9% FLUSH: 3.0000 mL | INTRAVENOUS | Status: DC | PRN

## 2021-05-07 MED ORDER — SODIUM CHLORIDE 0.9% FLUSH
3.0000 mL | Freq: Two times a day (BID) | INTRAVENOUS | Status: DC
  Administered 2021-05-07 – 2021-05-14 (×15): 3 mL via INTRAVENOUS

## 2021-05-07 MED ORDER — CHLORHEXIDINE GLUCONATE 4 % EX LIQD
60.0000 mL | Freq: Once | CUTANEOUS | Status: AC
  Administered 2021-05-07: 4 via TOPICAL
  Filled 2021-05-07: qty 45

## 2021-05-07 MED ORDER — SODIUM CHLORIDE 0.9 % IV SOLN: 250.0000 mL | INTRAVENOUS | Status: DC | PRN

## 2021-05-07 MED ORDER — RENA-VITE PO TABS
1.0000 | ORAL_TABLET | Freq: Every evening | ORAL | Status: DC
  Administered 2021-05-07 – 2021-05-13 (×7): 1 via ORAL
  Filled 2021-05-07 (×7): qty 1

## 2021-05-07 MED ORDER — CEFAZOLIN SODIUM-DEXTROSE 2-4 GM/100ML-% IV SOLN: 2.0000 g | INTRAVENOUS | Status: DC

## 2021-05-07 MED ORDER — POVIDONE-IODINE 10 % EX SWAB
2.0000 "application " | Freq: Once | CUTANEOUS | Status: AC
  Administered 2021-05-08: 2 via TOPICAL

## 2021-05-07 MED ORDER — CEFAZOLIN IN SODIUM CHLORIDE 3-0.9 GM/100ML-% IV SOLN
3.0000 g | INTRAVENOUS | Status: AC
  Administered 2021-05-08: 3 g via INTRAVENOUS
  Filled 2021-05-07 (×2): qty 100

## 2021-05-07 MED ORDER — HYDROMORPHONE HCL 1 MG/ML IJ SOLN
1.0000 mg | Freq: Once | INTRAMUSCULAR | Status: AC
  Administered 2021-05-07: 1 mg via INTRAVENOUS
  Filled 2021-05-07: qty 1

## 2021-05-07 MED ORDER — CINACALCET HCL 30 MG PO TABS
60.0000 mg | ORAL_TABLET | ORAL | Status: DC
  Administered 2021-05-07 – 2021-05-09 (×2): 60 mg via ORAL
  Filled 2021-05-07 (×4): qty 2

## 2021-05-07 MED ORDER — LEVOTHYROXINE SODIUM 75 MCG PO TABS
75.0000 ug | ORAL_TABLET | Freq: Every day | ORAL | Status: DC
  Administered 2021-05-07 – 2021-05-14 (×5): 75 ug via ORAL
  Filled 2021-05-07 (×7): qty 1

## 2021-05-07 MED ORDER — OXYCODONE HCL 5 MG PO TABS
5.0000 mg | ORAL_TABLET | Freq: Four times a day (QID) | ORAL | Status: DC | PRN
  Administered 2021-05-07 (×2): 5 mg via ORAL
  Filled 2021-05-07 (×2): qty 1

## 2021-05-07 MED ORDER — SODIUM CHLORIDE 0.9 % IV SOLN: INTRAVENOUS | Status: DC

## 2021-05-07 MED ORDER — ONDANSETRON HCL 4 MG PO TABS: 4.0000 mg | ORAL_TABLET | Freq: Four times a day (QID) | ORAL | Status: DC | PRN

## 2021-05-07 MED ORDER — HEPARIN SODIUM (PORCINE) 1000 UNIT/ML DIALYSIS
4000.0000 [IU] | INTRAMUSCULAR | Status: DC | PRN
  Filled 2021-05-07 (×3): qty 4

## 2021-05-07 MED ORDER — HEPARIN SODIUM (PORCINE) 5000 UNIT/ML IJ SOLN
5000.0000 [IU] | Freq: Three times a day (TID) | INTRAMUSCULAR | Status: DC
  Administered 2021-05-07 – 2021-05-12 (×12): 5000 [IU] via SUBCUTANEOUS
  Filled 2021-05-07 (×12): qty 1

## 2021-05-07 MED ORDER — MIDODRINE HCL 5 MG PO TABS
5.0000 mg | ORAL_TABLET | Freq: Three times a day (TID) | ORAL | Status: DC
  Administered 2021-05-07 – 2021-05-14 (×19): 5 mg via ORAL
  Filled 2021-05-07 (×19): qty 1

## 2021-05-07 MED ORDER — CHLORHEXIDINE GLUCONATE CLOTH 2 % EX PADS
6.0000 | MEDICATED_PAD | Freq: Every day | CUTANEOUS | Status: DC
  Administered 2021-05-08 – 2021-05-14 (×6): 6 via TOPICAL

## 2021-05-07 MED ORDER — ROSUVASTATIN CALCIUM 20 MG PO TABS
20.0000 mg | ORAL_TABLET | Freq: Every day | ORAL | Status: DC
  Administered 2021-05-07 – 2021-05-14 (×8): 20 mg via ORAL
  Filled 2021-05-07 (×8): qty 1

## 2021-05-07 MED ORDER — INSULIN GLARGINE-YFGN 100 UNIT/ML ~~LOC~~ SOLN
5.0000 [IU] | Freq: Every day | SUBCUTANEOUS | Status: DC
  Administered 2021-05-07 – 2021-05-14 (×8): 5 [IU] via SUBCUTANEOUS
  Filled 2021-05-07 (×8): qty 0.05

## 2021-05-07 MED ORDER — INSULIN ASPART 100 UNIT/ML IJ SOLN
0.0000 [IU] | Freq: Three times a day (TID) | INTRAMUSCULAR | Status: DC
Start: 2021-05-07 — End: 2021-05-14
  Administered 2021-05-07: 08:00:00 1 [IU] via SUBCUTANEOUS
  Administered 2021-05-08 – 2021-05-09 (×2): 2 [IU] via SUBCUTANEOUS
  Administered 2021-05-10: 17:00:00 1 [IU] via SUBCUTANEOUS
  Administered 2021-05-10: 09:00:00 3 [IU] via SUBCUTANEOUS
  Administered 2021-05-11: 17:00:00 1 [IU] via SUBCUTANEOUS
  Administered 2021-05-12: 14:00:00 3 [IU] via SUBCUTANEOUS
  Administered 2021-05-12 – 2021-05-13 (×2): 1 [IU] via SUBCUTANEOUS

## 2021-05-07 NOTE — Plan of Care (Signed)

## 2021-05-07 NOTE — Progress Notes (Addendum)
Patient seen and examined personally, I reviewed the chart, history and physical and admission note, done by admitting physician this morning and agree with the same with following addendum.  Please refer to the morning admission note for more detailed plan of care.  Briefly,  30 year old male with ESRD and HD TTS, T2DM, morbid obesity bmi 43, HTN, HLD, anemia of chronic disease, hypothyroidism presented with wound on the left great toe along with redness, pain initially started as a blister and ulcer on the bottom of the left great toe few weeks ago and got worse in last 2 to 3 days. He was seen in the ED systolic blood pressure insulin 80s given IV fluid boluses CT scan of the fluid no periosteal reaction MRI recommended, also had leukocytosis lactic acidosis abnormal LFTs and concern about severe sepsis PCCM was consulted and after discussion transferred under hospitalist for admission for severe sepsis  Seen this morning.  Pain is a stable, mildly tachycardic in 110s blood pressure stable alert awake oriented x3. Doing well on room air.  Lactic acidosis resolved.  A/P Severe sepsis due to MSSA bacteremia 2/2 left great wound and cellulitis: At this time blood pressure stable mildly tachycardic, lactic acidosis resolved. MRI pending to rule out osteomyelitis.  Dr. Sharol Given consulted, ABI ordered, continue vancomycin and cefepime-blood culture with MSSA, ID consulted  T2DM on insulin: Sugar is stable continue SSI, Lantus. Stable HbA1c 6 HTN: Holding BP meds due to sepsis HLD: Continue statin  ESRD and HD TTS: Dialysis for tomorrow, nephrology notified Anemia of chronic disease: Monitor hemoglobin Metabolic bone disease: Monitor calcium and Phos Hypokalemia defer replacement due to ESRD adjust  dialyzer  Hypothyroidism: continue Synthroid Morbid obesity bmi 43

## 2021-05-07 NOTE — Consult Note (Signed)
Renal Service Consult Note [name]  Paul [name] 05/07/2021 [name], MD Requesting Physician: Dr [name]  Reason for Consult: ESRD pt w/ diabetic foot infection HPI: The patient is a 30 y.o. year-old w/ hx of DM2, ESRD on HD, HTN, thyroid disease who presented w/ pain and redness of the L foot/ 1st toe going on for the last 2-3 weeks, worse the last few days. In ED SBP was in the 80's, he rec'd some IVF"s w/ improved BP's.  CT foot showed no osteo, but MRI was recommended. WBC was 18K. Pt given IV abx and for infection and admitted to progressive care unit.  We are asked to see for dialysis.   Pt seen in room. On HD for about 3 yrs. Lives w/ his mother. No fam hx of esrd.  Denies any recent HD issues.  His R arm AVG failed and he is supposed to see the [facility] group soon to discuss the next access. Using [facility] for now.    ROS - denies CP, no joint pain, no HA, no blurry vision, no rash, no diarrhea, no nausea/ vomiting   Past Medical History  Past Medical History:  Diagnosis Date   Acute hypoxemic respiratory failure ([place]) 09/11/2016   Anemia    Chronic kidney disease    ARF on CRF Dialysis T/TH/Sa   Diabetes mellitus    Type II   End stage renal disease on dialysis ([place])    [place]    GERD (gastroesophageal reflux disease)    diet controlled   HLD (hyperlipidemia)    Hypertension    Hypothyroidism    Morbid obesity ([place])    Sacral wound    resolved   Thyroid disease    Past Surgical History  Past Surgical History:  Procedure Laterality Date   AV FISTULA PLACEMENT Left 09/26/2016   Procedure: LEFT UPPER ARM ARTERIOVENOUS (AV) FISTULA CREATION;  Surgeon: Elam Dutch, MD;  Location: [facility];  Service: Vascular;  Laterality: Left;   AV FISTULA PLACEMENT Left 12/02/2016   Procedure: INSERTION OF ARTERIOVENOUS GORE-TEX GRAFT LEFT UPPER  ARM USING A 4-7MM BY 45CM GRAFT ;  Surgeon: Rosetta Posner, MD;  Location: [place];  Service: Vascular;  Laterality:  Left;   AV FISTULA PLACEMENT Right 07/03/2020   Procedure: INSERTION OF ARTERIOVENOUS (AV) GORE-TEX GRAFT RIGHT UPPER ARM;  Surgeon: Marty Heck, MD;  Location: [place];  Service: Vascular;  Laterality: Right;   [place] Right 05/06/2020   Procedure: FIRST STAGE RIGHT [place];  Surgeon: Serafina Mitchell, MD;  Location: [place];  Service: Vascular;  Laterality: Right;   EXCHANGE OF A DIALYSIS CATHETER Right 10/08/2016   Procedure: EXCHANGE OF A DIALYSIS CATHETER-RIGHT INTERNAL JUGULAR;  Surgeon: Conrad Scio, MD;  Location: [place];  Service: Vascular;  Laterality: Right;   INSERTION OF DIALYSIS CATHETER Right 09/26/2016   Procedure: INSERTION OF DIALYSIS CATHETER - Right Internal Jugular Placement;  Surgeon: Elam Dutch, MD;  Location: [place];  Service: Vascular;  Laterality: Right;   INSERTION OF DIALYSIS CATHETER Left 03/22/2018   Procedure: INSERTION OF DIALYSIS CATHETER;  Surgeon: Marty Heck, MD;  Location: [place];  Service: Vascular;  Laterality: Left;   IR FLUORO GUIDE CV LINE RIGHT  03/26/2021   IR US GUIDE VASC ACCESS RIGHT  03/26/2021   REVISON OF ARTERIOVENOUS FISTULA Left 03/22/2018   Procedure: LEFT UPPER EXTREMITY ARTERIOVENOUS  REVISION WITH GORE-TEX GRAFT.;  Surgeon: Marty Heck,  MD;  Location: [facility];  Service: Vascular;  Laterality: Left;   THROMBECTOMY Left 08/26/2019   thrombectomy of LUA loop AVG   THROMBECTOMY AND REVISION OF ARTERIOVENTOUS (AV) GORETEX  GRAFT Right 07/27/2020   Procedure: INSERTION OF RIGHT ARM LOOP GRAFT WITH EXCISION OF RIGHT ARM BRACHIAL AXILLARY GRAFT;  Surgeon: Marty Heck, MD;  Location: [place];  Service: Vascular;  Laterality: Right;   VISCERAL ANGIOGRAPHY Right 06/17/2020   Procedure: CENTRAL VENO;  Surgeon: Cherre Robins, MD;  Location: [place] [facility];  Service: Cardiovascular;  Laterality: Right;   Family History  Family History  Problem Relation Age of Onset   Heart  disease Mother    Hypertension Mother    Social History  reports that he has never smoked. He has never used smokeless tobacco. He reports current drug use. Frequency: 2.00 times per week. Drug: Marijuana. He reports that he does not drink alcohol. Allergies  Allergies  Allergen Reactions   Hydralazine Hcl Other (See Comments)    DRUG-INDUCED LUPUS   Home medications Prior to Admission medications   Medication Sig Start Date End Date Taking? Authorizing Provider  Accu-Chek FastClix Lancets MISC USE AS DIRECTED ONCE DAILY 06/01/20   Wilber Oliphant, MD  acetaminophen (TYLENOL) 500 MG tablet Take 1,000 mg by mouth every 6 (six) hours as needed (for pain.).    [provider]  amLODipine (NORVASC) 5 MG tablet Take 5 mg on nondialysis days. Patient taking differently: Take 5 mg by mouth 4 (four) times a week. Take 5 mg on Sun, Mon, Wed, and Fri (non-dialysis days) 03/07/20   Wilber Oliphant, MD  Blood Glucose Monitoring Suppl (ACCU-CHEK GUIDE ME) w/Device KIT 1 kit by Does not apply route daily. 02/05/19   Wilber Oliphant, MD  calcium carbonate (TUMS - DOSED IN MG ELEMENTAL CALCIUM) 500 MG chewable tablet Chew 1,000-1,500 mg by mouth daily as needed for indigestion or heartburn.    [provider]  cinacalcet (SENSIPAR) 60 MG tablet Take 60 mg by mouth every other day.    [provider]  ferric citrate (AURYXIA) 1 GM 210 MG(Fe) tablet Take 420-840 mg by mouth See admin instructions. Take 3-4 tablets (630 mg-840 mg) by mouth with each meals & take 1-2 tablets (210-420 mg) by mouth with each snack.    [provider]  glucose blood (ACCU-CHEK GUIDE) test strip 1 each by Other route 2 (two) times daily. Use as instructed 02/05/19   Wilber Oliphant, MD  heparin 1000 unit/mL SOLN injection Heparin Sodium (Porcine) 1,000 Units/mL Systemic 08/15/20 08/14/21  [provider]  LANTUS 100 UNIT/ML injection INJECT 0.1-0.15 MLS (10-15 UNITS) INTO THE SKIN 2 TIMES DAILY.  PATIENT NEEDS APPOINTMENT Patient taking differently: Inject 15-20 Units into the skin 2 (two) times daily. 03/19/21   Dameron, Luna Fuse, DO  levothyroxine (SYNTHROID) 75 MCG tablet TAKE 1 TABLET BY MOUTH ONCE DAILY BEFORE BREAKFAST 03/19/21   Dameron, Luna Fuse, DO  multivitamin (RENA-VIT) TABS tablet Take 1 tablet by mouth at bedtime. Patient taking differently: Take 1 tablet by mouth every evening. 10/25/16   Nicolette Bang, MD  rosuvastatin (CRESTOR) 20 MG tablet Take 2 tablets by mouth once daily Patient taking differently: Take 20 mg by mouth daily. 03/12/21   Dameron, Luna Fuse, DO  Syringe, Disposable, (B-D SYRINGE SLIP TIP 30CC) 30 ML MISC 1 Syringe by Does not apply route daily. 01/31/19   Wilber Oliphant, MD     Vitals:   05/06/21 2595  05/07/21 0028 05/07/21 0555 05/07/21 0742  BP: 97/66 109/73 116/62 102/75  Pulse: (!) 112 (!) 106  (!) 117  Resp: (!) _0 Temp:  98.9 F (37.2 C) 100.2 F (37.9 C) 99.9 F (37.7 C)  TempSrc:  Oral Oral   SpO2: 95%   95%  Weight:      Height:       Exam Gen alert, no distress No rash, cyanosis or gangrene Sclera anicteric, throat clear  No jvd or bruits Chest clear bilat to bases, no rales/ wheezing RRR no RG Abd soft ntnd no mass or ascites +bs obese GU normal male MS no joint effusions or deformity Ext trace pretib edema, L grt toe wrapped Neuro is alert, Ox 3 , nf  RIJ TDC intact       Home meds include - norvasc 45m, sensipar, auryxia, lantus 10-15u bid, synthroid, rena-vit, crestor, prns/ vits/ supps  CXR - IMPRESSION: There are no signs of pulmonary edema or focal pulmonary consolidation.   [facility]   4.5h   500/1.5   145kg  2/2 bath P2 TDC  Hep 8000  - hect 5ug tiw  - no esa     Na 132  K 32  CO2 23  BUN 32 Cr 13.15   Ca 9.2  WBC 18K Hb 11.9        Assessment/ Plan: Sepsis/ diabetic L foot infection - w/ ^WBC, hypotension. Getting IV abx. May need MRI. Per pmd.  ESRD - on HD TTS.  Had HD  yesterday. No acute HD needs. Plan HD on Sat.  HTN - BP's soft likely due to infection. Better today.  DM2 - on insulin.  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 [place]ar [place]  Anemia ckd - Hb >10, no esa at OP unit. Follow.       RKelly Splinter MD 05/07/2021, 8:50 AM  Recent Labs  Lab 05/06/21 1600 05/07/21 0312  WBC 19.5* 18.2*  HGB 14.3 11.9*   Recent Labs  Lab 05/06/21 1600 05/07/21 0312  K 3.8 3.2*  BUN 30* 32*  CREATININE 11.66* 13.15*  CALCIUM 9.7 9.2

## 2021-05-07 NOTE — Progress Notes (Signed)
Pt is a direct admit from [facility]. Pt arrived to the unit via stretcher accompanied by two EMT's/ pt was oriented to the room with call light within reach bed at lowest position. Admitting MD was notified.

## 2021-05-07 NOTE — Consult Note (Signed)
ORTHOPAEDIC CONSULTATION  REQUESTING PHYSICIAN: Antonieta Pert, MD  Chief Complaint: Chronic ulcer left great toe.  HPI: [name] is a 30 y.o. male who presents with diabetic insensate neuropathy with end-stage renal disease on dialysis who states he has had a long-term history of ulceration on the plantar aspect of the left great toe.  Past Medical History:  Diagnosis Date   Acute hypoxemic respiratory failure ([place]) 09/11/2016   Anemia    Chronic kidney disease    ARF on CRF Dialysis T/TH/Sa   Diabetes mellitus    Type II   End stage renal disease on dialysis ([place])    [place]    GERD (gastroesophageal reflux disease)    diet controlled   HLD (hyperlipidemia)    Hypertension    Hypothyroidism    Morbid obesity ([place])    Sacral wound    resolved   Thyroid disease    Past Surgical History:  Procedure Laterality Date   AV FISTULA PLACEMENT Left 09/26/2016   Procedure: LEFT UPPER ARM ARTERIOVENOUS (AV) FISTULA CREATION;  Surgeon: Elam Dutch, MD;  Location: [facility];  Service: Vascular;  Laterality: Left;   AV FISTULA PLACEMENT Left 12/02/2016   Procedure: INSERTION OF ARTERIOVENOUS GORE-TEX GRAFT LEFT UPPER  ARM USING A 4-7MM BY 45CM GRAFT ;  Surgeon: Rosetta Posner, MD;  Location: [facility];  Service: Vascular;  Laterality: Left;   AV FISTULA PLACEMENT Right 07/03/2020   Procedure: INSERTION OF ARTERIOVENOUS (AV) GORE-TEX GRAFT RIGHT UPPER ARM;  Surgeon: Marty Heck, MD;  Location: [facility];  Service: Vascular;  Laterality: Right;   BASCILIC VEIN TRANSPOSITION Right 05/06/2020   Procedure: FIRST STAGE RIGHT [place];  Surgeon: Serafina Mitchell, MD;  Location: [facility] [facility];  Service: Vascular;  Laterality: Right;   EXCHANGE OF A DIALYSIS CATHETER Right 10/08/2016   Procedure: EXCHANGE OF A DIALYSIS CATHETER-RIGHT INTERNAL JUGULAR;  Surgeon: Conrad Fort Duchesne, MD;  Location: [facility];  Service: Vascular;  Laterality: Right;   INSERTION OF DIALYSIS CATHETER Right  09/26/2016   Procedure: INSERTION OF DIALYSIS CATHETER - Right Internal Jugular Placement;  Surgeon: Elam Dutch, MD;  Location: [facility];  Service: Vascular;  Laterality: Right;   INSERTION OF DIALYSIS CATHETER Left 03/22/2018   Procedure: INSERTION OF DIALYSIS CATHETER;  Surgeon: Marty Heck, MD;  Location: [facility];  Service: Vascular;  Laterality: Left;   IR FLUORO GUIDE CV LINE RIGHT  03/26/2021   IR US GUIDE VASC ACCESS RIGHT  03/26/2021   REVISON OF ARTERIOVENOUS FISTULA Left 03/22/2018   Procedure: LEFT UPPER EXTREMITY ARTERIOVENOUS  REVISION WITH GORE-TEX GRAFT.;  Surgeon: Marty Heck, MD;  [facility];  Service: Vascular;  Laterality: Left;   THROMBECTOMY Left 08/26/2019   thrombectomy of LUA loop AVG   THROMBECTOMY AND REVISION OF ARTERIOVENTOUS (AV) GORETEX  GRAFT Right 07/27/2020   Procedure: INSERTION OF RIGHT ARM LOOP GRAFT WITH EXCISION OF RIGHT ARM BRACHIAL AXILLARY GRAFT;  Surgeon: Marty Heck, MD;  Location: [facility];  Service: Vascular;  Laterality: Right;   VISCERAL ANGIOGRAPHY Right 06/17/2020   Procedure: CENTRAL VENO;  Surgeon: Cherre Robins, MD;  Location: [facility];  Service: Cardiovascular;  Laterality: Right;   Social History   Socioeconomic History   Marital status: Single    Spouse name: Not on file   Number of children: Not on file   Years of education: Not on file   Highest education level: Not on file  Occupational  History   Not on file  Tobacco Use   Smoking status: Never   Smokeless tobacco: Never  Vaping Use   Vaping Use: Never used  Substance and Sexual Activity   Alcohol use: No   Drug use: Yes    Frequency: 2.0 times per week    Types: Marijuana    Comment: Last time 07/23/20   Sexual activity: Not on file  Other Topics Concern   Not on file  Social History Narrative   Not on file   Social Determinants of Health   Financial Resource Strain: Not on file  Food Insecurity: Not on file  Transportation  Needs: Not on file  Physical Activity: Not on file  Stress: Not on file  Social Connections: Not on file   Family History  Problem Relation Age of Onset   Heart disease Mother    Hypertension Mother    - negative except otherwise stated in the family history section Allergies  Allergen Reactions   Hydralazine Hcl Other (See Comments)    DRUG-INDUCED LUPUS   Prior to Admission medications   Medication Sig Start Date End Date Taking? Authorizing Provider  Accu-Chek FastClix Lancets MISC USE AS DIRECTED ONCE DAILY 06/01/20   Wilber Oliphant, MD  acetaminophen (TYLENOL) 500 MG tablet Take 1,000 mg by mouth every 6 (six) hours as needed (for pain.).    [provider]  amLODipine (NORVASC) 5 MG tablet Take 5 mg on nondialysis days. Patient taking differently: Take 5 mg by mouth 4 (four) times a week. Take 5 mg on Sun, Mon, Wed, and Fri (non-dialysis days) 03/07/20   Wilber Oliphant, MD  Blood Glucose Monitoring Suppl (ACCU-CHEK GUIDE ME) w/Device KIT 1 kit by Does not apply route daily. 02/05/19   Wilber Oliphant, MD  calcium carbonate (TUMS - DOSED IN MG ELEMENTAL CALCIUM) 500 MG chewable tablet Chew 1,000-1,500 mg by mouth daily as needed for indigestion or heartburn.    [provider]  cinacalcet (SENSIPAR) 60 MG tablet Take 60 mg by mouth every other day.    [provider]  ferric citrate (AURYXIA) 1 GM 210 MG(Fe) tablet Take 420-840 mg by mouth See admin instructions. Take 3-4 tablets (630 mg-840 mg) by mouth with each meals & take 1-2 tablets (210-420 mg) by mouth with each snack.    [provider]  glucose blood (ACCU-CHEK GUIDE) test strip 1 each by Other route 2 (two) times daily. Use as instructed 02/05/19   Wilber Oliphant, MD  heparin 1000 unit/mL SOLN injection Heparin Sodium (Porcine) 1,000 Units/mL Systemic 08/15/20 08/14/21  [provider]  LANTUS 100 UNIT/ML injection INJECT 0.1-0.15 MLS (10-15 UNITS) INTO THE SKIN 2 TIMES DAILY. PATIENT  NEEDS APPOINTMENT Patient taking differently: Inject 15-20 Units into the skin 2 (two) times daily. 03/19/21   Dameron, Luna Fuse, DO  levothyroxine (SYNTHROID) 75 MCG tablet TAKE 1 TABLET BY MOUTH ONCE DAILY BEFORE BREAKFAST 03/19/21   Dameron, Luna Fuse, DO  multivitamin (RENA-VIT) TABS tablet Take 1 tablet by mouth at bedtime. Patient taking differently: Take 1 tablet by mouth every evening. 10/25/16   Nicolette Bang, MD  rosuvastatin (CRESTOR) 20 MG tablet Take 2 tablets by mouth once daily Patient taking differently: Take 20 mg by mouth daily. 03/12/21   Dameron, Luna Fuse, DO  Syringe, Disposable, (B-D SYRINGE SLIP TIP 30CC) 30 ML MISC 1 Syringe by Does not apply route daily. 01/31/19   Wilber Oliphant, MD   CT Foot Left Wo Contrast  Result Date: 05/06/2021 CLINICAL DATA:  Foot wound infection.  Osteomyelitis suspected EXAM: CT OF THE LEFT FOOT WITHOUT CONTRAST TECHNIQUE: Multidetector CT imaging of the left foot was performed according to the standard protocol. Multiplanar CT image reconstructions were also generated. COMPARISON:  None. FINDINGS: Bones/Joint/Cartilage No cortical erosion or periosteal reaction to suggest acute osteomyelitis. No evidence of fracture or dislocation Ligaments Suboptimally assessed by CT. Muscles and Tendons Muscles are normal in bulk and density. No intramuscular fluid collection. Soft tissues Skin thickening and marked subcutaneous soft tissue edema about the first digit. There is deep skin wound on the plantar aspect of the first digit about the interphalangeal joint. No drainable fluid collection or abscess on this noncontrast enhanced examination. IMPRESSION: 1. Deep skin wound about the plantar aspect of the interphalangeal joint of the first digit. Skin thickening and subcutaneous soft tissue edema consistent with cellulitis. No cortical erosion or periosteal reaction suggesting osteomyelitis, however early osteomyelitis can not be excluded on CT examination.  Further evaluation with MRI examination is recommended. 2. No drainable fluid collection or abscess on this noncontrast enhanced examination. Electronically Signed   By: Keane Police D.O.   On: 05/06/2021 18:11   MR FOOT LEFT WO CONTRAST  Result Date: 05/07/2021 CLINICAL DATA:  Left great toe ulcer.  Left foot pain and redness. EXAM: MRI OF THE LEFT FOOT WITHOUT CONTRAST TECHNIQUE: Multiplanar, multisequence MR imaging of the left forefoot was performed. No intravenous contrast was administered. COMPARISON:  CT left foot from yesterday. FINDINGS: Bones/Joint/Cartilage Fairly confluent marrow edema involving the first proximal and distal phalanges, as well as the hallux sesamoids, with patchy T1 hypointensity, consistent with osteomyelitis. No fracture or dislocation. Joint spaces are preserved. No significant joint effusion. Ligaments Collateral ligaments are intact. Muscles and Tendons Partially torn distal flexor hallucis longus tendon with small amount of fluid surrounding the tendon. Remaining flexor and extensor tendons are intact. Increased T2 signal within the intrinsic muscles of the forefoot, nonspecific, but likely related to diabetic muscle changes. Soft tissue Soft tissue ulceration at the plantar base of the first IP joint. 2.0 x 0.7 x 1.3 cm fluid collection along the lateral aspect of the first proximal phalanx (series 7, image 22). IMPRESSION: 1. Soft tissue ulceration at the plantar base of the first IP joint with underlying osteomyelitis of the first proximal and distal phalanges, as well as the hallux sesamoids. 2. 2.0 cm abscess along the lateral aspect of the first proximal phalanx. 3. Partially torn distal flexor hallucis longus tendon with mild infectious tenosynovitis. Electronically Signed   By: Titus Dubin M.D.   On: 05/07/2021 10:45   DG Chest Port 1 View  Result Date: 05/06/2021 CLINICAL DATA:  Possible septicemia EXAM: PORTABLE CHEST 1 VIEW COMPARISON:  Previous studies  including the examination 1031 2019 FINDINGS: Transverse diameter of heart is in the upper limits of normal. Central pulmonary vessels are prominent. There are no signs of alveolar pulmonary edema or focal pulmonary consolidation. Right hemidiaphragm is elevated. There is crowding of markings in the right lower lung fields. There is no pleural effusion or pneumothorax. Tip dialysis catheter is seen at the junction of superior vena cava and right atrium. IMPRESSION: There are no signs of pulmonary edema or focal pulmonary consolidation. Electronically Signed   By: Elmer Picker M.D.   On: 05/06/2021 16:38   VAS [place] ABI WITH/WO TBI  Result Date: 05/07/2021  LOWER EXTREMITY DOPPLER STUDY Patient Name:  RAAHIM SHARTZER  Date of Exam:   05/07/2021 Medical  Rec #: [id number]      Accession #:    [id number] Date of Birth: 09-Feb-1991       Patient Gender: M Patient Age:   47 years Exam Location[facility] Procedure:      VAS [place] ABI WITH/WO TBI Referring Phys: Antavion Karter Hellmer --------------------------------------------------------------------------------  Indications: Left great toe ulceration. High Risk Factors: Hyperlipidemia, Diabetes. Other Factors: ESRD on dialysis.  Limitations: Today's exam was limited due to Bandaging, history of multiple UE              dialysis accesses bilaterally. Comparison Study: No prior studies. Performing Technologist: Darlin Coco RDMS RVT  Examination Guidelines: A complete evaluation includes at minimum, Doppler waveform signals and systolic blood pressure reading at the level of bilateral brachial, anterior tibial, and posterior tibial arteries, when vessel segments are accessible. Bilateral testing is considered an integral part of a complete examination. Photoelectric Plethysmograph (PPG) waveforms and toe systolic pressure readings are included as required and additional duplex testing as needed. Limited examinations for reoccurring indications may be performed as noted.   ABI Findings: +---------+------------------+-----+---------+---------------------------------+  Right     Rt Pressure (mmHg) Index Waveform  Comment                            +---------+------------------+-----+---------+---------------------------------+  Brachial                           triphasic Brachial waveform only, unable to                                                obtain pressure due to hx of                                                     dialysis access                    +---------+------------------+-----+---------+---------------------------------+  PTA       128                1.28  triphasic                                    +---------+------------------+-----+---------+---------------------------------+  DP        102                1.02  triphasic                                    +---------+------------------+-----+---------+---------------------------------+  Great Toe 70                 0.70  Normal                                       +---------+------------------+-----+---------+---------------------------------+ +---------+------------------+-----+---------+---------------------------------+  Left      Lt Pressure (mmHg) Index Waveform  Comment                            +---------+------------------+-----+---------+---------------------------------+  Brachial  100                      triphasic Brachial waveform, radial/forearm                                                pressure due to hx of dialysis                                                   access                             +---------+------------------+-----+---------+---------------------------------+  PTA       126                1.26  triphasic                                    +---------+------------------+-----+---------+---------------------------------+  DP        114                1.14  triphasic                                     +---------+------------------+-----+---------+---------------------------------+  Great Toe                                    Unable to obtain due to                                                          wound/bandaging                    +---------+------------------+-----+---------+---------------------------------+  Summary: Right: Resting right ankle-brachial index is within normal range. No evidence of significant right lower extremity arterial disease. The right toe-brachial index is normal. Left: Resting left ankle-brachial index is within normal range. No evidence of significant left lower extremity arterial disease.  *See table(s) above for measurements and observations.     Preliminary    - pertinent xrays, CT, MRI studies were reviewed and independently interpreted  Positive ROS: All other systems have been reviewed and were otherwise negative with the exception of those mentioned in the HPI and as above.  Physical Exam: General: Alert, no acute distress Psychiatric: Patient is competent for consent with normal mood and affect Lymphatic: No axillary or cervical lymphadenopathy Cardiovascular: No pedal edema Respiratory: No cyanosis, no use of accessory musculature GI: No organomegaly, abdomen is soft and non-tender    Images:  _0 @  Labs:  Lab Results  Component Value Date   HGBA1C 6.0 (H) 05/07/2021   HGBA1C 5.8 03/17/2021   HGBA1C 6.1 09/18/2019   REPTSTATUS PENDING 05/06/2021   GRAMSTAIN  05/06/2021    NO WBC SEEN MODERATE GRAM  POSITIVE COCCI IN PAIRS Performed at [facility], [place] [address], [place], [place] [address]    CULT PENDING 05/06/2021    Lab Results  Component Value Date   ALBUMIN 4.3 05/06/2021   ALBUMIN 5.5 (H) 06/14/2019   ALBUMIN 2.9 (L) 09/29/2016     CBC EXTENDED Latest Ref Rng & Units 05/07/2021 05/06/2021 07/27/2020  WBC 4.0 - 10.5 K/uL 18.2(H) 19.5(H) -  RBC 4.22 - 5.81 MIL/uL 4.38 5.12 -  HGB 13.0 - 17.0 g/dL 11.9(L)  14.3 15.6  HCT 39.0 - 52.0 % 36.8(L) 43.0 46.0  PLT 150 - 400 K/uL 202 226 -  NEUTROABS 1.7 - 7.7 K/uL - 15.0(H) -  LYMPHSABS 0.7 - 4.0 K/uL - 2.4 -    Neurologic: Patient does not have protective sensation bilateral lower extremities.   MUSCULOSKELETAL:   Skin: Examination there is a necrotic ulcer 2 cm in diameter on the plantar aspect of the great toe.  This probes to bone there is necrotic flexor tendon.  Patient does have a faintly palpable dorsalis pedis and posterior tibial pulse.  Review of the ankle-brachial indices shows triphasic flow with 70 mm of pressure in the right great toe.  Patient's hemoglobin is 11.9.  White cell count has decreased from 19.5-18.2.  Hemoglobin A1c is 6.0.  Review of the MRI scan shows extensive osteomyelitis of the great toe.  The MRI scan also shows involvement of the sesamoids.  There is edema within the MTP joint.  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  Plan: Anticipate would have to proceed with an amputation through the first metatarsal neck.  This should allow for resection of the abscess infected sesamoids and the infected great toe.  Will try to proceed with surgery tomorrow Saturday morning.  Patient will need to hold dialysis until after surgery.  Thank you for the consult and the opportunity to see Mr. Alexandros Ewan, [place] [phone] 2:56 PM

## 2021-05-07 NOTE — Consult Note (Signed)
WOC Nurse Consult Note: Patient receiving care in [facility] 5N17 [facility] sent to [facility],MD to consult Dr. Sharol Given. Reason for Consult: Foot diabetic ulcer Wound type: Diabetic ulcer of the left great toe Pressure Injury POA: NA Wound bed: See photo taken today by Dr. [name] in media. The toe is edematous with erythema tracking down the medial side of the foot Drainage (amount, consistency, odor)  Dressing procedure/placement/frequency: Apply a piece of Xeroform gauze over the wound, cover with 2 x 2 and wrap with Kerlix. Consult to Dr. Sharol Given MR and ABI studies pending.   Monitor the wound area(s) for worsening of condition such as: Signs/symptoms of infection, increase in size, development of or worsening of odor, development of pain, or increased pain at the affected locations.   Notify the medical team if any of these develop.  Thank you for the consult. [place] nurse will not follow at this time.   Please re-consult the [place] team if needed.  Cathlean Marseilles Tamala Julian, MSN, RN, [place], Lysle Pearl, [facility] Wound Treatment Associate Pager [phone]

## 2021-05-07 NOTE — Consult Note (Addendum)
[name]    Date of Admission:  05/06/2021     Reason for Consult: MSSA bacteremia     Referring Physician: Johnnye Lana consult  Current antibiotics: Vanco, Cefepime, Flagyl 12/15-present    ASSESSMENT:    30 y.o. male admitted with:  MSSA bacteremia: Secondary to left great toe wound with cellulitis and MRI that also confirms osteomyelitis and abscess.  Preliminary ABI's are normal on the left.   A superficial wound culture of the toe wound also shows GPC in pairs. ESRD on HD: Currently has a tunneled HD line in the right [place].  This does not appear to be source of bacteremia but is at risk of being seeded in the setting of MSSA. Type 2 DM Severe sepsis: Due to #1.  RECOMMENDATIONS:    Start cefazolin Stop cefepime, vanco, flagyl Repeat blood cultures TTE Anticipate need for amputation for source control In setting of MSSA bacteremia, would also recommend HD line removal and holiday Will follow   Principal Problem:   MSSA bacteremia Active Problems:   Type 2 diabetes mellitus (HCC)   Essential hypertension, benign   ESRD on dialysis ([place])   Sepsis due to cellulitis (HCC)   Wound infection   Osteomyelitis (HCC)   Abscess   MEDICATIONS:    Scheduled Meds:  cinacalcet  60 mg Oral QODAY   heparin  5,000 Units Subcutaneous Q8H   insulin aspart  0-6 Units Subcutaneous TID WC & HS   insulin glargine-yfgn  5 Units Subcutaneous Daily   levothyroxine  75 mcg Oral QAC breakfast   multivitamin  1 tablet Oral QPM   rosuvastatin  20 mg Oral Daily   sodium chloride flush  3 mL Intravenous Q12H   Continuous Infusions:  sodium chloride     [START ON 05/08/2021]  ceFAZolin (ANCEF) IV     PRN Meds:.sodium chloride, acetaminophen **OR** acetaminophen, calcium carbonate, ondansetron **OR** ondansetron (ZOFRAN) IV, oxyCODONE, senna-docusate, sodium chloride flush  HPI:    Paul [name] is a 30 y.o. male with PMHx of ESRD on HD, DM2, HTN, HLD  who presented with worsening foot ulcer.  He has been dealing with a great toe ulcer for the past few months and trying to manage it on his own.  He reports it acutely worsened which prompted him to present to the ED.  He was found to be febrile with elevated WBC and admitted.  He was started on broad spectrum antibiotics and blood cultures were obtained which are growing MSSA.  He also has been having trouble with his dialysis graft so a temporary HD line was placed last month.  He has had no issues with this.  He has no other joint pain or back pain. We are consulted in the setting of MSSA bacteremia.    Past Medical History:  Diagnosis Date   Acute hypoxemic respiratory failure ([place]) 09/11/2016   Anemia    Chronic kidney disease    ARF on CRF Dialysis T/TH/Sa   Diabetes mellitus    Type II   End stage renal disease on dialysis ([place])    [place]    GERD (gastroesophageal reflux disease)    diet controlled   HLD (hyperlipidemia)    Hypertension    Hypothyroidism    Morbid obesity ([place])    Sacral wound    resolved   Thyroid disease     Social History   Tobacco Use   Smoking status: Never   Smokeless tobacco: Never  Vaping Use   Vaping Use: Never used  Substance Use Topics   Alcohol use: No   Drug use: Yes    Frequency: 2.0 times per week    Types: Marijuana    Comment: Last time 07/23/20    Family History  Problem Relation Age of Onset   Heart disease Mother    Hypertension Mother     Allergies  Allergen Reactions   Hydralazine Hcl Other (See Comments)    DRUG-INDUCED LUPUS    Review of Systems  Constitutional:  Positive for fever and malaise/fatigue.  Respiratory: Negative.    Cardiovascular: Negative.   Gastrointestinal: Negative.   Musculoskeletal:  Positive for joint pain. Negative for neck pain.  Skin:        Great toe wound.  All other systems reviewed and are negative.  OBJECTIVE:   Blood pressure 99/65, pulse (!) 109, temperature (!) 101.1  F (38.4 C), resp. rate 18, height 6\' 1"  (1.854 m), weight (!) 149.2 kg, SpO2 100 %. Body mass index is 43.41 kg/m.  Physical Exam Constitutional:      General: He is not in acute distress.    Appearance: Normal appearance.  HENT:     Head: Normocephalic and atraumatic.  Eyes:     Extraocular Movements: Extraocular movements intact.     Conjunctiva/sclera: Conjunctivae normal.  Cardiovascular:     Rate and Rhythm: Normal rate and regular rhythm.     Comments: Right sided HD line in place.  No TTP, no erythema, no drainage.  Pulmonary:     Effort: Pulmonary effort is normal. No respiratory distress.     Breath sounds: Normal breath sounds.  Abdominal:     General: There is no distension.     Palpations: Abdomen is soft.     Tenderness: There is no abdominal tenderness.  Musculoskeletal:     Comments: Left great toe is wrapped.  There is warmth, erythema, and swelling of the adjacent foot.   Skin:    General: Skin is warm and dry.     Findings: No lesion.  Neurological:     General: No focal deficit present.     Mental Status: He is alert and oriented to person, place, and time.  Psychiatric:        Mood and Affect: Mood normal.        Behavior: Behavior normal.      Lab Results: Lab Results  Component Value Date   WBC 18.2 (H) 05/07/2021   HGB 11.9 (L) 05/07/2021   HCT 36.8 (L) 05/07/2021   MCV 84.0 05/07/2021   PLT 202 05/07/2021    Lab Results  Component Value Date   NA 132 (L) 05/07/2021   K 3.2 (L) 05/07/2021   CO2 23 05/07/2021   GLUCOSE 158 (H) 05/07/2021   BUN 32 (H) 05/07/2021   CREATININE 13.15 (H) 05/07/2021   CALCIUM 9.2 05/07/2021   GFRNONAA 5 (L) 05/07/2021   GFRAA 6 (L) 06/14/2019    Lab Results  Component Value Date   ALT 12 05/06/2021   AST 21 05/06/2021   ALKPHOS 69 05/06/2021   BILITOT 0.7 05/06/2021    No results found for: CRP  No results found for: ESRSEDRATE  I have reviewed the micro and lab results in Epic.  Imaging: CT  Foot Left Wo Contrast  Result Date: 05/06/2021 CLINICAL DATA:  Foot wound infection.  Osteomyelitis suspected EXAM: CT OF THE LEFT FOOT WITHOUT CONTRAST TECHNIQUE: Multidetector CT imaging of the left foot was  performed according to the standard protocol. Multiplanar CT image reconstructions were also generated. COMPARISON:  None. FINDINGS: Bones/Joint/Cartilage No cortical erosion or periosteal reaction to suggest acute osteomyelitis. No evidence of fracture or dislocation Ligaments Suboptimally assessed by CT. Muscles and Tendons Muscles are normal in bulk and density. No intramuscular fluid collection. Soft tissues Skin thickening and marked subcutaneous soft tissue edema about the first digit. There is deep skin wound on the plantar aspect of the first digit about the interphalangeal joint. No drainable fluid collection or abscess on this noncontrast enhanced examination. IMPRESSION: 1. Deep skin wound about the plantar aspect of the interphalangeal joint of the first digit. Skin thickening and subcutaneous soft tissue edema consistent with cellulitis. No cortical erosion or periosteal reaction suggesting osteomyelitis, however early osteomyelitis can not be excluded on CT examination. Further evaluation with MRI examination is recommended. 2. No drainable fluid collection or abscess on this noncontrast enhanced examination. Electronically Signed   By: Keane Police D.O.   On: 05/06/2021 18:11   MR FOOT LEFT WO CONTRAST  Result Date: 05/07/2021 CLINICAL DATA:  Left great toe ulcer.  Left foot pain and redness. EXAM: MRI OF THE LEFT FOOT WITHOUT CONTRAST TECHNIQUE: Multiplanar, multisequence MR imaging of the left forefoot was performed. No intravenous contrast was administered. COMPARISON:  CT left foot from yesterday. FINDINGS: Bones/Joint/Cartilage Fairly confluent marrow edema involving the first proximal and distal phalanges, as well as the hallux sesamoids, with patchy T1 hypointensity, consistent  with osteomyelitis. No fracture or dislocation. Joint spaces are preserved. No significant joint effusion. Ligaments Collateral ligaments are intact. Muscles and Tendons Partially torn distal flexor hallucis longus tendon with small amount of fluid surrounding the tendon. Remaining flexor and extensor tendons are intact. Increased T2 signal within the intrinsic muscles of the forefoot, nonspecific, but likely related to diabetic muscle changes. Soft tissue Soft tissue ulceration at the plantar base of the first IP joint. 2.0 x 0.7 x 1.3 cm fluid collection along the lateral aspect of the first proximal phalanx (series 7, image 22). IMPRESSION: 1. Soft tissue ulceration at the plantar base of the first IP joint with underlying osteomyelitis of the first proximal and distal phalanges, as well as the hallux sesamoids. 2. 2.0 cm abscess along the lateral aspect of the first proximal phalanx. 3. Partially torn distal flexor hallucis longus tendon with mild infectious tenosynovitis. Electronically Signed   By: Titus Dubin M.D.   On: 05/07/2021 10:45   DG Chest Port 1 View  Result Date: 05/06/2021 CLINICAL DATA:  Possible septicemia EXAM: PORTABLE CHEST 1 VIEW COMPARISON:  Previous studies including the examination 1031 2019 FINDINGS: Transverse diameter of heart is in the upper limits of normal. Central pulmonary vessels are prominent. There are no signs of alveolar pulmonary edema or focal pulmonary consolidation. Right hemidiaphragm is elevated. There is crowding of markings in the right lower lung fields. There is no pleural effusion or pneumothorax. Tip dialysis catheter is seen at the junction of superior vena cava and right atrium. IMPRESSION: There are no signs of pulmonary edema or focal pulmonary consolidation. Electronically Signed   By: Elmer Picker M.D.   On: 05/06/2021 16:38   VAS [place] ABI WITH/WO TBI  Result Date: 05/07/2021  LOWER EXTREMITY DOPPLER STUDY Patient Name:  LAURICE KIMMONS  Date  of Exam:   05/07/2021 Medical Rec #: [id number]      Accession #:    [id number] Date of Birth: February 27, 1991       Patient Gender: M Patient Age:  30 years Exam Location:  [facility] Procedure:      VAS [place] ABI WITH/WO TBI Referring Phys: Munir DUDA --------------------------------------------------------------------------------  Indications: Left great toe ulceration. High Risk Factors: Hyperlipidemia, Diabetes. Other Factors: ESRD on dialysis.  Limitations: Today's exam was limited due to Bandaging, history of multiple UE              dialysis accesses bilaterally. Comparison Study: No prior studies. Performing Technologist: Darlin Coco RDMS RVT  Examination Guidelines: A complete evaluation includes at minimum, Doppler waveform signals and systolic blood pressure reading at the level of bilateral brachial, anterior tibial, and posterior tibial arteries, when vessel segments are accessible. Bilateral testing is considered an integral part of a complete examination. Photoelectric Plethysmograph (PPG) waveforms and toe systolic pressure readings are included as required and additional duplex testing as needed. Limited examinations for reoccurring indications may be performed as noted.  ABI Findings: +---------+------------------+-----+---------+---------------------------------+  Right     Rt Pressure (mmHg) Index Waveform  Comment                            +---------+------------------+-----+---------+---------------------------------+  Brachial                           triphasic Brachial waveform only, unable to                                                obtain pressure due to hx of                                                     dialysis access                    +---------+------------------+-----+---------+---------------------------------+  PTA       128                1.28  triphasic                                     +---------+------------------+-----+---------+---------------------------------+  DP        102                1.02  triphasic                                    +---------+------------------+-----+---------+---------------------------------+  Great Toe 70                 0.70  Normal                                       +---------+------------------+-----+---------+---------------------------------+ +---------+------------------+-----+---------+---------------------------------+  Left      Lt Pressure (mmHg) Index Waveform  Comment                            +---------+------------------+-----+---------+---------------------------------+  Brachial  100  triphasic Brachial waveform, radial/forearm                                                pressure due to hx of dialysis                                                   access                             +---------+------------------+-----+---------+---------------------------------+  PTA       126                1.26  triphasic                                    +---------+------------------+-----+---------+---------------------------------+  DP        114                1.14  triphasic                                    +---------+------------------+-----+---------+---------------------------------+  Great Toe                                    Unable to obtain due to                                                          wound/bandaging                    +---------+------------------+-----+---------+---------------------------------+  Summary: Right: Resting right ankle-brachial index is within normal range. No evidence of significant right lower extremity arterial disease. The right toe-brachial index is normal. Left: Resting left ankle-brachial index is within normal range. No evidence of significant left lower extremity arterial disease.  *See table(s) above for measurements and observations.     Preliminary      Imaging  independently reviewed in Epic.  [facility] [phone] pager 05/07/2021, 2:03 PM

## 2021-05-07 NOTE — Progress Notes (Signed)
PHARMACY - PHYSICIAN COMMUNICATION CRITICAL VALUE ALERT - BLOOD CULTURE IDENTIFICATION (BCID)  Paul [name] is an 30 y.o. male who presented to [facility] on 05/06/2021 with a chief complaint of left great toe wound and cellulitis  Assessment:   MSSA bacteremia found in 30 year old man with left great toe wound and cellulitis  Name of physician (or Provider) Contacted: Dr. [name] and Dr. Juleen China  Current antibiotics: Vancomycin IV  Changes to prescribed antibiotics recommended: Change to Cefazolin, dosed after dialysis.  ID service to see today  Recommendations accepted by provider  Results for orders placed or performed during the hospital encounter of 05/06/21  Blood Culture ID Panel (Reflexed) (Collected: 05/06/2021  4:15 PM)  Result Value Ref Range   Enterococcus faecalis NOT DETECTED NOT DETECTED   Enterococcus Faecium NOT DETECTED NOT DETECTED   Listeria monocytogenes NOT DETECTED NOT DETECTED   Staphylococcus species DETECTED (A) NOT DETECTED   Staphylococcus aureus (BCID) DETECTED (A) NOT DETECTED   Staphylococcus epidermidis NOT DETECTED NOT DETECTED   Staphylococcus lugdunensis NOT DETECTED NOT DETECTED   Streptococcus species NOT DETECTED NOT DETECTED   Streptococcus agalactiae NOT DETECTED NOT DETECTED   Streptococcus pneumoniae NOT DETECTED NOT DETECTED   Streptococcus pyogenes NOT DETECTED NOT DETECTED   A.calcoaceticus-baumannii NOT DETECTED NOT DETECTED   Bacteroides fragilis NOT DETECTED NOT DETECTED   Enterobacterales NOT DETECTED NOT DETECTED   Enterobacter cloacae complex NOT DETECTED NOT DETECTED   Escherichia coli NOT DETECTED NOT DETECTED   Klebsiella aerogenes NOT DETECTED NOT DETECTED   Klebsiella oxytoca NOT DETECTED NOT DETECTED   Klebsiella pneumoniae NOT DETECTED NOT DETECTED   Proteus species NOT DETECTED NOT DETECTED   Salmonella species NOT DETECTED NOT DETECTED   Serratia marcescens NOT DETECTED NOT DETECTED   Haemophilus influenzae NOT DETECTED  NOT DETECTED   Neisseria meningitidis NOT DETECTED NOT DETECTED   Pseudomonas aeruginosa NOT DETECTED NOT DETECTED   Stenotrophomonas maltophilia NOT DETECTED NOT DETECTED   Candida albicans NOT DETECTED NOT DETECTED   Candida auris NOT DETECTED NOT DETECTED   Candida glabrata NOT DETECTED NOT DETECTED   Candida krusei NOT DETECTED NOT DETECTED   Candida parapsilosis NOT DETECTED NOT DETECTED   Candida tropicalis NOT DETECTED NOT DETECTED   Cryptococcus neoformans/gattii NOT DETECTED NOT DETECTED   Meth resistant mecA/C and MREJ NOT DETECTED NOT DETECTED    [name] 05/07/2021  12:40 PM

## 2021-05-07 NOTE — H&P (Signed)
History and Physical    Paul [name] XTA:[id number] DOB: 04/26/1991 DOA: 05/06/2021  PCP: Paul Brill, DO   Patient coming from: Home via [facility] ER  Chief Complaint: Pain in left foot.   HPI: Paul [name] is a 30 y.o. male with medical history significant for DMT2, ESRD on HD Tu, Th, Sat, HTN who presents for evaluation of pain and redness of left foot and first toe. He ports she is having blister and ulcer on the bottom of his left first toe for the last few weeks that has gotten worse in the last 2 to 3 days.  He reports he has had some drainage from the area.  He reports on the morning of May 06, 2021 he had chills with mild nausea but no vomiting or diarrhea.  He had increased pain in the left first toe.  He describes the pain as a dull toothache that can become intense rating it a 7-8 out of 10 on the pain scale.  Dialysis on December 15 and had his normal treatment.  After dialysis he felt like he had a higher fever and did not feel well so he went to the emergency room.  He was started on empiric antibiotics.  He was found to have low blood pressure in the emergency room initially and was given some IV fluid.  After few hours of blood pressure did improve.  ER did not give him a full 30 mL/kg fluid bolus as he is a dialysis and does not make urine.  He has not had any chest pain, shortness of breath, cough, syncope dizziness or abdominal pain.  ED Course: In the emergency room Paul [name] had systolic blood pressures in the 80s.  His systolic blood pressure is normally in the 140-150 range.  He was given IV fluid after a few hours his systolic blood pressure improved to the 90-105 range.  CT scan of his foot showed no periosteal reaction but radiology recommended MRI of the foot to better evaluate for osteomyelitis.  He was found to have leukocytosis with white blood cell count of 19,000.  Lactic acid was 2.3 and decreased to 2.0 after some IV fluids.  Sodium 134 potassium 3.8 chloride 92  bicarb 25 creatinine 11.66 BUN 30 glucose 163 alkaline phosphatase 69 AST 21 ALT 12.  COVID-19 negative.  ER physician did discuss with PCCM but they refused admission as patient has not had 4-1/2 L of fluid bolus based on his weight.  Hospitalist service asked to admit for further management and was accepted to progressive care unit  Review of Systems:  General: Reports fever, chills. Denies weight loss, night sweats.  Denies dizziness.  Denies change in appetite HENT: Denies head trauma, headache, denies change in hearing, tinnitus.  Denies nasal congestion or bleeding.  Denies sore throat.  Denies difficulty swallowing Eyes: Denies blurry vision, pain in eye, drainage.  Denies discoloration of eyes. Neck: Denies pain.  Denies swelling.  Denies pain with movement. Cardiovascular: Denies chest pain, palpitations. Denies edema.  Denies orthopnea Respiratory: Denies shortness of breath, cough. Denies wheezing.  Denies sputum production Gastrointestinal: Denies abdominal pain, swelling.  Denies nausea, vomiting, diarrhea.  Denies melena.  Denies hematemesis. Musculoskeletal: Reports pain in left first toe. Denies limitation of movement. Denies arthralgias or myalgias. Genitourinary: Denies pelvic pain.  Denies urinary frequency or hesitancy.  Denies dysuria.  Skin: Has ulcer on left first toe, swelling of left foot. Denies rash.  Denies petechiae, purpura, ecchymosis. Neurological: Denies syncope. Denies seizure activity.  Denies paresthesia. Denies slurred speech, drooping face.  Denies visual change. Psychiatric: Denies depression, anxiety. Denies hallucinations.  Past Medical History:  Diagnosis Date   Acute hypoxemic respiratory failure ([place]) 09/11/2016   Anemia    Chronic kidney disease    ARF on CRF Dialysis T/TH/Sa   Diabetes mellitus    Type II   End stage renal disease on dialysis ([place])    [place]    GERD (gastroesophageal reflux disease)    diet controlled   HLD  (hyperlipidemia)    Hypertension    Hypothyroidism    Morbid obesity ([place])    Sacral wound    resolved   Thyroid disease     Past Surgical History:  Procedure Laterality Date   AV FISTULA PLACEMENT Left 09/26/2016   Procedure: LEFT UPPER ARM ARTERIOVENOUS (AV) FISTULA CREATION;  Surgeon: Elam Dutch, MD;  Location: [place];  Service: Vascular;  Laterality: Left;   AV FISTULA PLACEMENT Left 12/02/2016   Procedure: INSERTION OF ARTERIOVENOUS GORE-TEX GRAFT LEFT UPPER  ARM USING A 4-7MM BY 45CM GRAFT ;  Surgeon: Rosetta Posner, MD;  Location: [place];  Service: Vascular;  Laterality: Left;   AV FISTULA PLACEMENT Right 07/03/2020   Procedure: INSERTION OF ARTERIOVENOUS (AV) GORE-TEX GRAFT RIGHT UPPER ARM;  Surgeon: Marty Heck, MD;  Location: [place];  Service: Vascular;  Laterality: Right;   BASCILIC VEIN TRANSPOSITION Right 05/06/2020   Procedure: FIRST STAGE RIGHT [place];  Surgeon: Serafina Mitchell, MD;  Location: [place];  Service: Vascular;  Laterality: Right;   EXCHANGE OF A DIALYSIS CATHETER Right 10/08/2016   Procedure: EXCHANGE OF A DIALYSIS CATHETER-RIGHT INTERNAL JUGULAR;  Surgeon: Conrad Benicia, MD;  Location: [place];  Service: Vascular;  Laterality: Right;   INSERTION OF DIALYSIS CATHETER Right 09/26/2016   Procedure: INSERTION OF DIALYSIS CATHETER - Right Internal Jugular Placement;  Surgeon: Elam Dutch, MD;  Location: [place];  Service: Vascular;  Laterality: Right;   INSERTION OF DIALYSIS CATHETER Left 03/22/2018   Procedure: INSERTION OF DIALYSIS CATHETER;  Surgeon: Marty Heck, MD;  Location: [place];  Service: Vascular;  Laterality: Left;   IR FLUORO GUIDE CV LINE RIGHT  03/26/2021   IR US GUIDE VASC ACCESS RIGHT  03/26/2021   REVISON OF ARTERIOVENOUS FISTULA Left 03/22/2018   Procedure: LEFT UPPER EXTREMITY ARTERIOVENOUS  REVISION WITH GORE-TEX GRAFT.;  Surgeon: Marty Heck, MD;  [facility];  Service: Vascular;  Laterality: Left;    THROMBECTOMY Left 08/26/2019   thrombectomy of LUA loop AVG   THROMBECTOMY AND REVISION OF ARTERIOVENTOUS (AV) GORETEX  GRAFT Right 07/27/2020   Procedure: INSERTION OF RIGHT ARM LOOP GRAFT WITH EXCISION OF RIGHT ARM BRACHIAL AXILLARY GRAFT;  Surgeon: Marty Heck, MD;  Location: [place];  Service: Vascular;  Laterality: Right;   VISCERAL ANGIOGRAPHY Right 06/17/2020   Procedure: CENTRAL VENO;  Surgeon: Cherre Robins, MD;  Location: [place] [facility];  Service: Cardiovascular;  Laterality: Right;    Social History  reports that he has never smoked. He has never used smokeless tobacco. He reports current drug use. Frequency: 2.00 times per week. Drug: Marijuana. He reports that he does not drink alcohol.  Allergies  Allergen Reactions   Hydralazine Hcl Other (See Comments)    DRUG-INDUCED LUPUS    Family History  Problem Relation Age of Onset   Heart disease Mother    Hypertension Mother      Prior to Admission medications  Medication Sig Start Date End Date Taking? Authorizing Provider  Accu-Chek FastClix Lancets MISC USE AS DIRECTED ONCE DAILY 06/01/20   Wilber Oliphant, MD  acetaminophen (TYLENOL) 500 MG tablet Take 1,000 mg by mouth every 6 (six) hours as needed (for pain.).    [provider]  amLODipine (NORVASC) 5 MG tablet Take 5 mg on nondialysis days. Patient taking differently: Take 5 mg by mouth 4 (four) times a week. Take 5 mg on Sun, Mon, Wed, and Fri (non-dialysis days) 03/07/20   Wilber Oliphant, MD  Blood Glucose Monitoring Suppl (ACCU-CHEK GUIDE ME) w/Device KIT 1 kit by Does not apply route daily. 02/05/19   Wilber Oliphant, MD  calcium carbonate (TUMS - DOSED IN MG ELEMENTAL CALCIUM) 500 MG chewable tablet Chew 1,000-1,500 mg by mouth daily as needed for indigestion or heartburn.    [provider]  cinacalcet (SENSIPAR) 60 MG tablet Take 60 mg by mouth every other day.    [provider]  ferric citrate (AURYXIA) 1 GM 210 MG(Fe)  tablet Take 420-840 mg by mouth See admin instructions. Take 3-4 tablets (630 mg-840 mg) by mouth with each meals & take 1-2 tablets (210-420 mg) by mouth with each snack.    [provider]  glucose blood (ACCU-CHEK GUIDE) test strip 1 each by Other route 2 (two) times daily. Use as instructed 02/05/19   Wilber Oliphant, MD  heparin 1000 unit/mL SOLN injection Heparin Sodium (Porcine) 1,000 Units/mL Systemic 08/15/20 08/14/21  [provider]  LANTUS 100 UNIT/ML injection INJECT 0.1-0.15 MLS (10-15 UNITS) INTO THE SKIN 2 TIMES DAILY. PATIENT NEEDS APPOINTMENT Patient taking differently: Inject 15-20 Units into the skin 2 (two) times daily. 03/19/21   Dameron, Luna Fuse, DO  levothyroxine (SYNTHROID) 75 MCG tablet TAKE 1 TABLET BY MOUTH ONCE DAILY BEFORE BREAKFAST 03/19/21   Dameron, Luna Fuse, DO  multivitamin (RENA-VIT) TABS tablet Take 1 tablet by mouth at bedtime. Patient taking differently: Take 1 tablet by mouth every evening. 10/25/16   Nicolette Bang, MD  rosuvastatin (CRESTOR) 20 MG tablet Take 2 tablets by mouth once daily Patient taking differently: Take 20 mg by mouth daily. 03/12/21   Dameron, Luna Fuse, DO  Syringe, Disposable, (B-D SYRINGE SLIP TIP 30CC) 30 ML MISC 1 Syringe by Does not apply route daily. 01/31/19   Wilber Oliphant, MD    Physical Exam: Vitals:   05/06/21 2157 05/06/21 2230 05/06/21 2315 05/07/21 0028  BP: 93/62 123/83 97/66 109/73  Pulse: (!) 105 (!) 110 (!) 112 (!) 106  Resp: 14 (!) 24 (!) 26 20  Temp:    98.9 F (37.2 C)  TempSrc:    Oral  SpO2: 96% 95% 95%   Weight:      Height:        Constitutional: NAD, calm, comfortable Vitals:   05/06/21 2157 05/06/21 2230 05/06/21 2315 05/07/21 0028  BP: 93/62 123/83 97/66 109/73  Pulse: (!) 105 (!) 110 (!) 112 (!) 106  Resp: 14 (!) 24 (!) 26 20  Temp:    98.9 F (37.2 C)  TempSrc:    Oral  SpO2: 96% 95% 95%   Weight:      Height:       General: WDWN, Alert and oriented x3.  Eyes: EOMI,  PERRL,conjunctivae normal. Sclera nonicteric HENT:  [place]/AT, external ears normal.  Nares patent without epistasis.  Mucous membranes are moist. Neck: Soft, normal range of motion, supple, no masses,  Trachea midline Respiratory: clear to auscultation bilaterally,  no wheezing, no crackles. Normal respiratory effort. No accessory muscle use.  Cardiovascular: Regular  rhythm, tachycardia. no murmurs / rubs / gallops. No extremity edema. 1+ pedal pulses.  Abdomen: Soft, no tenderness, nondistended, no rebound or guarding.  No masses palpated. Bowel sounds normoactive Musculoskeletal: FROM. no cyanosis. No joint deformity upper and lower extremities. no contractures. Normal muscle tone. Left foot with swelling. Left first toe plantar surface with deep ulcer 2 cm with no drainage but has surrounding erythema. Left foot warm to touch.  Skin: Warm, dry, intact no rashes.  Neurologic: CN 2-12 grossly intact.  Normal speech. Strength 5/5 in all extremities.   Psychiatric: Normal judgment and insight.  Normal mood.    Labs on Admission: I have personally reviewed following labs and imaging studies  CBC: Recent Labs  Lab 05/06/21 1600  WBC 19.5*  NEUTROABS 15.0*  HGB 14.3  HCT 43.0  MCV 84.0  PLT 268    Basic Metabolic Panel: Recent Labs  Lab 05/06/21 1600  NA 134*  K 3.8  CL 92*  CO2 25  GLUCOSE 163*  BUN 30*  CREATININE 11.66*  CALCIUM 9.7    GFR: Estimated Creatinine Clearance: 14.4 mL/min (A) (by C-G formula based on SCr of 11.66 mg/dL (H)).  Liver Function Tests: Recent Labs  Lab 05/06/21 1600  AST 21  ALT 12  ALKPHOS 69  BILITOT 0.7  PROT 9.8*  ALBUMIN 4.3    Urine analysis:    Component Value Date/Time   COLORURINE YELLOW 09/11/2016 0740   APPEARANCEUR HAZY (A) 09/11/2016 0740   LABSPEC 1.012 09/11/2016 0740   PHURINE 5.0 09/11/2016 0740   GLUCOSEU 50 (A) 09/11/2016 0740   HGBUR MODERATE (A) 09/11/2016 0740   BILIRUBINUR NEGATIVE 09/11/2016 0740   KETONESUR  NEGATIVE 09/11/2016 0740   PROTEINUR >=300 (A) 09/11/2016 0740   UROBILINOGEN 0.2 05/30/2016 1117   NITRITE NEGATIVE 09/11/2016 0740   LEUKOCYTESUR NEGATIVE 09/11/2016 0740    Radiological Exams on Admission: CT Foot Left Wo Contrast  Result Date: 05/06/2021 CLINICAL DATA:  Foot wound infection.  Osteomyelitis suspected EXAM: CT OF THE LEFT FOOT WITHOUT CONTRAST TECHNIQUE: Multidetector CT imaging of the left foot was performed according to the standard protocol. Multiplanar CT image reconstructions were also generated. COMPARISON:  None. FINDINGS: Bones/Joint/Cartilage No cortical erosion or periosteal reaction to suggest acute osteomyelitis. No evidence of fracture or dislocation Ligaments Suboptimally assessed by CT. Muscles and Tendons Muscles are normal in bulk and density. No intramuscular fluid collection. Soft tissues Skin thickening and marked subcutaneous soft tissue edema about the first digit. There is deep skin wound on the plantar aspect of the first digit about the interphalangeal joint. No drainable fluid collection or abscess on this noncontrast enhanced examination. IMPRESSION: 1. Deep skin wound about the plantar aspect of the interphalangeal joint of the first digit. Skin thickening and subcutaneous soft tissue edema consistent with cellulitis. No cortical erosion or periosteal reaction suggesting osteomyelitis, however early osteomyelitis can not be excluded on CT examination. Further evaluation with MRI examination is recommended. 2. No drainable fluid collection or abscess on this noncontrast enhanced examination. Electronically Signed   By: Keane Police D.O.   On: 05/06/2021 18:11   DG Chest Port 1 View  Result Date: 05/06/2021 CLINICAL DATA:  Possible septicemia EXAM: PORTABLE CHEST 1 VIEW COMPARISON:  Previous studies including the examination 1031 2019 FINDINGS: Transverse diameter of heart is in the upper limits of normal. Central pulmonary vessels are prominent. There are  no signs of  alveolar pulmonary edema or focal pulmonary consolidation. Right hemidiaphragm is elevated. There is crowding of markings in the right lower lung fields. There is no pleural effusion or pneumothorax. Tip dialysis catheter is seen at the junction of superior vena cava and right atrium. IMPRESSION: There are no signs of pulmonary edema or focal pulmonary consolidation. Electronically Signed   By: Elmer Picker M.D.   On: 05/06/2021 16:38     Assessment/Plan Principal Problem:   Sepsis due to cellulitis Mr. Genrich is admitted to Progressive care bed. Meets Sepsis criteria with diabetic foot wound that is infected and has elevated WBC and low BP.  Pt was given IVF in the ER but is saline lock now as BP has improved and pt is anuric with his ESRD. He has HD on Tuesday, Thursday and Saturdays Check lactic acid level as pt had elevated level in the ER.  Vancomycin for suspected osteomyelitis of left 1st toe and foot.  Pt had low BP initially which has improved after IV fluids in Er. Check CBC, BMP in am  Active Problems:   Wound infection Has deep ulcer of plantar surface of left first toe. Will obtain MRI foot to better evaluate for osteomyeltis as the presence of osteomyelitis will dictate duration of antibiotic coverage.  Dilaudid given IV now for pain and oxycodone Q 6 hour for pain will be provided.     ESRD on dialysis  Has HD on T,Th, Sat. Consult Nephrology during day to arrange for HD tomorrow.     Type 2 diabetes mellitus Continue lantus once a day for basal insulin. Monitor blood sugar qac,qhs and SSI provided as needed for glycemic control. Check HgbA1c    Essential hypertension, benign Hold norvasc overnight as pt had low BP initially.   DVT prophylaxis: Heparin for DVT prophylaxis.  Code Status:   Full Code  Family Communication:  Diagnosis and plan discussed with patient and family at bedside.  They verbalized understanding agree with plan.  Questions  answered.  Further recommendations to follow as clinical indicated Disposition Plan:   Patient is from:  Home  Anticipated DC to:  Home  Anticipated DC date:  Anticipate 2 midnight or more stay in the hospital to treat condition  Admission status:  Inpatient  Yevonne Aline Ohn Bostic MD [facility]  How to contact the [facility] Attending or Consulting provider [place] or covering provider during after hours [place], for this patient?   Check the care team in [facility] and look for a) attending/consulting [facility] provider listed and b) the [facility] team listed Log into [email or web address] and use 's universal password to access. If you do not have the password, please contact the hospital operator. Locate the [facility] provider you are looking for under [facility] and page to a number that you can be directly reached. If you still have difficulty reaching the provider, please page the [facility] (Director on Call) for the Hospitalists listed on amion for assistance.  05/07/2021, 2:27 AM

## 2021-05-07 NOTE — Plan of Care (Signed)

## 2021-05-07 NOTE — H&P (View-Only) (Signed)
ORTHOPAEDIC CONSULTATION  REQUESTING PHYSICIAN: Antonieta Pert, MD  Chief Complaint: Chronic ulcer left great toe.  HPI: Paul [name] is a 30 y.o. male who presents with diabetic insensate neuropathy with end-stage renal disease on dialysis who states he has had a long-term history of ulceration on the plantar aspect of the left great toe.  Past Medical History:  Diagnosis Date   Acute hypoxemic respiratory failure ([place]) 09/11/2016   Anemia    Chronic kidney disease    ARF on CRF Dialysis T/TH/Sa   Diabetes mellitus    Type II   End stage renal disease on dialysis ([place])    [place]    GERD (gastroesophageal reflux disease)    diet controlled   HLD (hyperlipidemia)    Hypertension    Hypothyroidism    Morbid obesity ([place])    Sacral wound    resolved   Thyroid disease    Past Surgical History:  Procedure Laterality Date   AV FISTULA PLACEMENT Left 09/26/2016   Procedure: LEFT UPPER ARM ARTERIOVENOUS (AV) FISTULA CREATION;  Surgeon: Elam Dutch, MD;  Location: [facility];  Service: Vascular;  Laterality: Left;   AV FISTULA PLACEMENT Left 12/02/2016   Procedure: INSERTION OF ARTERIOVENOUS GORE-TEX GRAFT LEFT UPPER  ARM USING A 4-7MM BY 45CM GRAFT ;  Surgeon: Rosetta Posner, MD;  Location: [facility];  Service: Vascular;  Laterality: Left;   AV FISTULA PLACEMENT Right 07/03/2020   Procedure: INSERTION OF ARTERIOVENOUS (AV) GORE-TEX GRAFT RIGHT UPPER ARM;  Surgeon: Marty Heck, MD;  Location: [facility];  Service: Vascular;  Laterality: Right;   BASCILIC VEIN TRANSPOSITION Right 05/06/2020   Procedure: FIRST STAGE RIGHT [place];  Surgeon: Serafina Mitchell, MD;  Location: [facility] [facility];  Service: Vascular;  Laterality: Right;   EXCHANGE OF A DIALYSIS CATHETER Right 10/08/2016   Procedure: EXCHANGE OF A DIALYSIS CATHETER-RIGHT INTERNAL JUGULAR;  Surgeon: Conrad Fort Duchesne, MD;  Location: [facility];  Service: Vascular;  Laterality: Right;   INSERTION OF DIALYSIS CATHETER Right  09/26/2016   Procedure: INSERTION OF DIALYSIS CATHETER - Right Internal Jugular Placement;  Surgeon: Elam Dutch, MD;  Location: [facility];  Service: Vascular;  Laterality: Right;   INSERTION OF DIALYSIS CATHETER Left 03/22/2018   Procedure: INSERTION OF DIALYSIS CATHETER;  Surgeon: Marty Heck, MD;  Location: [facility];  Service: Vascular;  Laterality: Left;   IR FLUORO GUIDE CV LINE RIGHT  03/26/2021   IR US GUIDE VASC ACCESS RIGHT  03/26/2021   REVISON OF ARTERIOVENOUS FISTULA Left 03/22/2018   Procedure: LEFT UPPER EXTREMITY ARTERIOVENOUS  REVISION WITH GORE-TEX GRAFT.;  Surgeon: Marty Heck, MD;  [facility];  Service: Vascular;  Laterality: Left;   THROMBECTOMY Left 08/26/2019   thrombectomy of LUA loop AVG   THROMBECTOMY AND REVISION OF ARTERIOVENTOUS (AV) GORETEX  GRAFT Right 07/27/2020   Procedure: INSERTION OF RIGHT ARM LOOP GRAFT WITH EXCISION OF RIGHT ARM BRACHIAL AXILLARY GRAFT;  Surgeon: Marty Heck, MD;  Location: [facility];  Service: Vascular;  Laterality: Right;   VISCERAL ANGIOGRAPHY Right 06/17/2020   Procedure: CENTRAL VENO;  Surgeon: Cherre Robins, MD;  Location: [facility];  Service: Cardiovascular;  Laterality: Right;   Social History   Socioeconomic History   Marital status: Single    Spouse name: Not on file   Number of children: Not on file   Years of education: Not on file   Highest education level: Not on file  Occupational  History   Not on file  Tobacco Use   Smoking status: Never   Smokeless tobacco: Never  Vaping Use   Vaping Use: Never used  Substance and Sexual Activity   Alcohol use: No   Drug use: Yes    Frequency: 2.0 times per week    Types: Marijuana    Comment: Last time 07/23/20   Sexual activity: Not on file  Other Topics Concern   Not on file  Social History Narrative   Not on file   Social Determinants of Health   Financial Resource Strain: Not on file  Food Insecurity: Not on file  Transportation  Needs: Not on file  Physical Activity: Not on file  Stress: Not on file  Social Connections: Not on file   Family History  Problem Relation Age of Onset   Heart disease Mother    Hypertension Mother    - negative except otherwise stated in the family history section Allergies  Allergen Reactions   Hydralazine Hcl Other (See Comments)    DRUG-INDUCED LUPUS   Prior to Admission medications   Medication Sig Start Date End Date Taking? Authorizing Provider  Accu-Chek FastClix Lancets MISC USE AS DIRECTED ONCE DAILY 06/01/20   Wilber Oliphant, MD  acetaminophen (TYLENOL) 500 MG tablet Take 1,000 mg by mouth every 6 (six) hours as needed (for pain.).    [provider]  amLODipine (NORVASC) 5 MG tablet Take 5 mg on nondialysis days. Patient taking differently: Take 5 mg by mouth 4 (four) times a week. Take 5 mg on Sun, Mon, Wed, and Fri (non-dialysis days) 03/07/20   Wilber Oliphant, MD  Blood Glucose Monitoring Suppl (ACCU-CHEK GUIDE ME) w/Device KIT 1 kit by Does not apply route daily. 02/05/19   Wilber Oliphant, MD  calcium carbonate (TUMS - DOSED IN MG ELEMENTAL CALCIUM) 500 MG chewable tablet Chew 1,000-1,500 mg by mouth daily as needed for indigestion or heartburn.    [provider]  cinacalcet (SENSIPAR) 60 MG tablet Take 60 mg by mouth every other day.    [provider]  ferric citrate (AURYXIA) 1 GM 210 MG(Fe) tablet Take 420-840 mg by mouth See admin instructions. Take 3-4 tablets (630 mg-840 mg) by mouth with each meals & take 1-2 tablets (210-420 mg) by mouth with each snack.    [provider]  glucose blood (ACCU-CHEK GUIDE) test strip 1 each by Other route 2 (two) times daily. Use as instructed 02/05/19   Wilber Oliphant, MD  heparin 1000 unit/mL SOLN injection Heparin Sodium (Porcine) 1,000 Units/mL Systemic 08/15/20 08/14/21  [provider]  LANTUS 100 UNIT/ML injection INJECT 0.1-0.15 MLS (10-15 UNITS) INTO THE SKIN 2 TIMES DAILY. PATIENT  NEEDS APPOINTMENT Patient taking differently: Inject 15-20 Units into the skin 2 (two) times daily. 03/19/21   Dameron, Luna Fuse, DO  levothyroxine (SYNTHROID) 75 MCG tablet TAKE 1 TABLET BY MOUTH ONCE DAILY BEFORE BREAKFAST 03/19/21   Dameron, Luna Fuse, DO  multivitamin (RENA-VIT) TABS tablet Take 1 tablet by mouth at bedtime. Patient taking differently: Take 1 tablet by mouth every evening. 10/25/16   Nicolette Bang, MD  rosuvastatin (CRESTOR) 20 MG tablet Take 2 tablets by mouth once daily Patient taking differently: Take 20 mg by mouth daily. 03/12/21   Dameron, Luna Fuse, DO  Syringe, Disposable, (B-D SYRINGE SLIP TIP 30CC) 30 ML MISC 1 Syringe by Does not apply route daily. 01/31/19   Wilber Oliphant, MD   CT Foot Left Wo Contrast  Result Date: 05/06/2021 CLINICAL DATA:  Foot wound infection.  Osteomyelitis suspected EXAM: CT OF THE LEFT FOOT WITHOUT CONTRAST TECHNIQUE: Multidetector CT imaging of the left foot was performed according to the standard protocol. Multiplanar CT image reconstructions were also generated. COMPARISON:  None. FINDINGS: Bones/Joint/Cartilage No cortical erosion or periosteal reaction to suggest acute osteomyelitis. No evidence of fracture or dislocation Ligaments Suboptimally assessed by CT. Muscles and Tendons Muscles are normal in bulk and density. No intramuscular fluid collection. Soft tissues Skin thickening and marked subcutaneous soft tissue edema about the first digit. There is deep skin wound on the plantar aspect of the first digit about the interphalangeal joint. No drainable fluid collection or abscess on this noncontrast enhanced examination. IMPRESSION: 1. Deep skin wound about the plantar aspect of the interphalangeal joint of the first digit. Skin thickening and subcutaneous soft tissue edema consistent with cellulitis. No cortical erosion or periosteal reaction suggesting osteomyelitis, however early osteomyelitis can not be excluded on CT examination.  Further evaluation with MRI examination is recommended. 2. No drainable fluid collection or abscess on this noncontrast enhanced examination. Electronically Signed   By: Keane Police D.O.   On: 05/06/2021 18:11   MR FOOT LEFT WO CONTRAST  Result Date: 05/07/2021 CLINICAL DATA:  Left great toe ulcer.  Left foot pain and redness. EXAM: MRI OF THE LEFT FOOT WITHOUT CONTRAST TECHNIQUE: Multiplanar, multisequence MR imaging of the left forefoot was performed. No intravenous contrast was administered. COMPARISON:  CT left foot from yesterday. FINDINGS: Bones/Joint/Cartilage Fairly confluent marrow edema involving the first proximal and distal phalanges, as well as the hallux sesamoids, with patchy T1 hypointensity, consistent with osteomyelitis. No fracture or dislocation. Joint spaces are preserved. No significant joint effusion. Ligaments Collateral ligaments are intact. Muscles and Tendons Partially torn distal flexor hallucis longus tendon with small amount of fluid surrounding the tendon. Remaining flexor and extensor tendons are intact. Increased T2 signal within the intrinsic muscles of the forefoot, nonspecific, but likely related to diabetic muscle changes. Soft tissue Soft tissue ulceration at the plantar base of the first IP joint. 2.0 x 0.7 x 1.3 cm fluid collection along the lateral aspect of the first proximal phalanx (series 7, image 22). IMPRESSION: 1. Soft tissue ulceration at the plantar base of the first IP joint with underlying osteomyelitis of the first proximal and distal phalanges, as well as the hallux sesamoids. 2. 2.0 cm abscess along the lateral aspect of the first proximal phalanx. 3. Partially torn distal flexor hallucis longus tendon with mild infectious tenosynovitis. Electronically Signed   By: Titus Dubin M.D.   On: 05/07/2021 10:45   DG Chest Port 1 View  Result Date: 05/06/2021 CLINICAL DATA:  Possible septicemia EXAM: PORTABLE CHEST 1 VIEW COMPARISON:  Previous studies  including the examination 1031 2019 FINDINGS: Transverse diameter of heart is in the upper limits of normal. Central pulmonary vessels are prominent. There are no signs of alveolar pulmonary edema or focal pulmonary consolidation. Right hemidiaphragm is elevated. There is crowding of markings in the right lower lung fields. There is no pleural effusion or pneumothorax. Tip dialysis catheter is seen at the junction of superior vena cava and right atrium. IMPRESSION: There are no signs of pulmonary edema or focal pulmonary consolidation. Electronically Signed   By: Elmer Picker M.D.   On: 05/06/2021 16:38   VAS [place] ABI WITH/WO TBI  Result Date: 05/07/2021  LOWER EXTREMITY DOPPLER STUDY Patient Name:  Paul [name]  Date of Exam:   05/07/2021 Medical  Rec #: [id number]      Accession #:    [id number] Date of Birth: 09-Feb-1991       Patient Gender: M Patient Age:   47 years Exam Location[facility] Procedure:      VAS [place] ABI WITH/WO TBI Referring Phys: Antavion Avira Tillison --------------------------------------------------------------------------------  Indications: Left great toe ulceration. High Risk Factors: Hyperlipidemia, Diabetes. Other Factors: ESRD on dialysis.  Limitations: Today's exam was limited due to Bandaging, history of multiple UE              dialysis accesses bilaterally. Comparison Study: No prior studies. Performing Technologist: Darlin Coco RDMS RVT  Examination Guidelines: A complete evaluation includes at minimum, Doppler waveform signals and systolic blood pressure reading at the level of bilateral brachial, anterior tibial, and posterior tibial arteries, when vessel segments are accessible. Bilateral testing is considered an integral part of a complete examination. Photoelectric Plethysmograph (PPG) waveforms and toe systolic pressure readings are included as required and additional duplex testing as needed. Limited examinations for reoccurring indications may be performed as noted.   ABI Findings: +---------+------------------+-----+---------+---------------------------------+  Right     Rt Pressure (mmHg) Index Waveform  Comment                            +---------+------------------+-----+---------+---------------------------------+  Brachial                           triphasic Brachial waveform only, unable to                                                obtain pressure due to hx of                                                     dialysis access                    +---------+------------------+-----+---------+---------------------------------+  PTA       128                1.28  triphasic                                    +---------+------------------+-----+---------+---------------------------------+  DP        102                1.02  triphasic                                    +---------+------------------+-----+---------+---------------------------------+  Great Toe 70                 0.70  Normal                                       +---------+------------------+-----+---------+---------------------------------+ +---------+------------------+-----+---------+---------------------------------+  Left      Lt Pressure (mmHg) Index Waveform  Comment                            +---------+------------------+-----+---------+---------------------------------+  Brachial  100                      triphasic Brachial waveform, radial/forearm                                                pressure due to hx of dialysis                                                   access                             +---------+------------------+-----+---------+---------------------------------+  PTA       126                1.26  triphasic                                    +---------+------------------+-----+---------+---------------------------------+  DP        114                1.14  triphasic                                     +---------+------------------+-----+---------+---------------------------------+  Great Toe                                    Unable to obtain due to                                                          wound/bandaging                    +---------+------------------+-----+---------+---------------------------------+  Summary: Right: Resting right ankle-brachial index is within normal range. No evidence of significant right lower extremity arterial disease. The right toe-brachial index is normal. Left: Resting left ankle-brachial index is within normal range. No evidence of significant left lower extremity arterial disease.  *See table(s) above for measurements and observations.     Preliminary    - pertinent xrays, CT, MRI studies were reviewed and independently interpreted  Positive ROS: All other systems have been reviewed and were otherwise negative with the exception of those mentioned in the HPI and as above.  Physical Exam: General: Alert, no acute distress Psychiatric: Patient is competent for consent with normal mood and affect Lymphatic: No axillary or cervical lymphadenopathy Cardiovascular: No pedal edema Respiratory: No cyanosis, no use of accessory musculature GI: No organomegaly, abdomen is soft and non-tender    Images:  _0 @  Labs:  Lab Results  Component Value Date   HGBA1C 6.0 (H) 05/07/2021   HGBA1C 5.8 03/17/2021   HGBA1C 6.1 09/18/2019   REPTSTATUS PENDING 05/06/2021   GRAMSTAIN  05/06/2021    NO WBC SEEN MODERATE GRAM  POSITIVE COCCI IN PAIRS Performed at [facility], [place] [address], [place], [place] [address]    CULT PENDING 05/06/2021    Lab Results  Component Value Date   ALBUMIN 4.3 05/06/2021   ALBUMIN 5.5 (H) 06/14/2019   ALBUMIN 2.9 (L) 09/29/2016     CBC EXTENDED Latest Ref Rng & Units 05/07/2021 05/06/2021 07/27/2020  WBC 4.0 - 10.5 K/uL 18.2(H) 19.5(H) -  RBC 4.22 - 5.81 MIL/uL 4.38 5.12 -  HGB 13.0 - 17.0 g/dL 11.9(L)  14.3 15.6  HCT 39.0 - 52.0 % 36.8(L) 43.0 46.0  PLT 150 - 400 K/uL 202 226 -  NEUTROABS 1.7 - 7.7 K/uL - 15.0(H) -  LYMPHSABS 0.7 - 4.0 K/uL - 2.4 -    Neurologic: Patient does not have protective sensation bilateral lower extremities.   MUSCULOSKELETAL:   Skin: Examination there is a necrotic ulcer 2 cm in diameter on the plantar aspect of the great toe.  This probes to bone there is necrotic flexor tendon.  Patient does have a faintly palpable dorsalis pedis and posterior tibial pulse.  Review of the ankle-brachial indices shows triphasic flow with 70 mm of pressure in the right great toe.  Patient's hemoglobin is 11.9.  White cell count has decreased from 19.5-18.2.  Hemoglobin A1c is 6.0.  Review of the MRI scan shows extensive osteomyelitis of the great toe.  The MRI scan also shows involvement of the sesamoids.  There is edema within the MTP joint.  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  Plan: Anticipate would have to proceed with an amputation through the first metatarsal neck.  This should allow for resection of the abscess infected sesamoids and the infected great toe.  Will try to proceed with surgery tomorrow Saturday morning.  Patient will need to hold dialysis until after surgery.  Thank you for the consult and the opportunity to see Mr. Alexandros Ewan, [place] [phone] 2:56 PM

## 2021-05-07 NOTE — Progress Notes (Signed)
ABI study completed.   Please see CV Proc for preliminary results.   Darlin Coco, RDMS, RVT

## 2021-05-07 NOTE — Progress Notes (Signed)
Pharmacy Antibiotic Note  Jazziel Fitzsimmons is a 30 y.o. male with ESRD on HD TTSat admitted on 05/06/2021 with LLE infection.  Pharmacy has been consulted for Vancomycin  dosing.  Vancomycin 2 g IV given in ED at  1730 12/15  Plan: Vancomycin 1000 mg IV after each HD  Height: 6\' 1"  (185.4 cm) Weight: (!) 149.2 kg (329 lb) IBW/kg (Calculated) : 79.9  Temp (24hrs), Avg:99.5 F (37.5 C), Min:98.9 F (37.2 C), Max:100.4 F (38 C)  Recent Labs  Lab 05/06/21 1600 05/06/21 1740  WBC 19.5*  --   CREATININE 11.66*  --   LATICACIDVEN 2.3* 2.0*    Estimated Creatinine Clearance: 14.4 mL/min (A) (by C-G formula based on SCr of 11.66 mg/dL (H)).    Allergies  Allergen Reactions   Hydralazine Hcl Other (See Comments)    DRUG-INDUCED LUPUS    Caryl Pina 05/07/2021 2:29 AM

## 2021-05-07 NOTE — Plan of Care (Signed)
  Problem: Education: Goal: Knowledge of General Education information will improve Description: Including pain rating scale, medication(s)/side effects and non-pharmacologic comfort measures Outcome: Progressing   Problem: Activity: Goal: Risk for activity intolerance will decrease Outcome: Progressing   Problem: Nutrition: Goal: Adequate nutrition will be maintained Outcome: Progressing   Problem: Coping: Goal: Level of anxiety will decrease Outcome: Progressing   Problem: Elimination: Goal: Will not experience complications related to bowel motility Outcome: Progressing   Problem: Pain Managment: Goal: General experience of comfort will improve Outcome: Progressing   

## 2021-05-08 ENCOUNTER — Encounter (HOSPITAL_COMMUNITY): Payer: Self-pay | Admitting: Internal Medicine

## 2021-05-08 ENCOUNTER — Inpatient Hospital Stay (HOSPITAL_COMMUNITY): Payer: Medicaid Other | Admitting: Certified Registered Nurse Anesthetist

## 2021-05-08 ENCOUNTER — Encounter (HOSPITAL_COMMUNITY)
Admission: EM | Disposition: A | Discharge status: Home / Self Care | Payer: Self-pay | Source: Home / Self Care | Attending: Internal Medicine

## 2021-05-08 DIAGNOSIS — I82210 Acute embolism and thrombosis of superior vena cava: Secondary | ICD-10-CM | POA: Diagnosis not present

## 2021-05-08 DIAGNOSIS — B9561 Methicillin susceptible Staphylococcus aureus infection as the cause of diseases classified elsewhere: Secondary | ICD-10-CM | POA: Diagnosis not present

## 2021-05-08 DIAGNOSIS — R7881 Bacteremia: Secondary | ICD-10-CM | POA: Diagnosis not present

## 2021-05-08 DIAGNOSIS — R6520 Severe sepsis without septic shock: Secondary | ICD-10-CM | POA: Diagnosis not present

## 2021-05-08 DIAGNOSIS — E1122 Type 2 diabetes mellitus with diabetic chronic kidney disease: Secondary | ICD-10-CM | POA: Diagnosis not present

## 2021-05-08 DIAGNOSIS — E039 Hypothyroidism, unspecified: Secondary | ICD-10-CM | POA: Diagnosis not present

## 2021-05-08 DIAGNOSIS — I120 Hypertensive chronic kidney disease with stage 5 chronic kidney disease or end stage renal disease: Secondary | ICD-10-CM | POA: Diagnosis not present

## 2021-05-08 DIAGNOSIS — A4101 Sepsis due to Methicillin susceptible Staphylococcus aureus: Secondary | ICD-10-CM | POA: Diagnosis not present

## 2021-05-08 DIAGNOSIS — E1169 Type 2 diabetes mellitus with other specified complication: Secondary | ICD-10-CM | POA: Diagnosis not present

## 2021-05-08 DIAGNOSIS — Z992 Dependence on renal dialysis: Secondary | ICD-10-CM | POA: Diagnosis not present

## 2021-05-08 DIAGNOSIS — N186 End stage renal disease: Secondary | ICD-10-CM | POA: Diagnosis not present

## 2021-05-08 DIAGNOSIS — M869 Osteomyelitis, unspecified: Secondary | ICD-10-CM | POA: Diagnosis not present

## 2021-05-08 DIAGNOSIS — Z20822 Contact with and (suspected) exposure to covid-19: Secondary | ICD-10-CM | POA: Diagnosis not present

## 2021-05-08 DIAGNOSIS — E1140 Type 2 diabetes mellitus with diabetic neuropathy, unspecified: Secondary | ICD-10-CM | POA: Diagnosis not present

## 2021-05-08 DIAGNOSIS — D638 Anemia in other chronic diseases classified elsewhere: Secondary | ICD-10-CM | POA: Diagnosis not present

## 2021-05-08 DIAGNOSIS — Z6841 Body Mass Index (BMI) 40.0 and over, adult: Secondary | ICD-10-CM | POA: Diagnosis not present

## 2021-05-08 DIAGNOSIS — L97529 Non-pressure chronic ulcer of other part of left foot with unspecified severity: Secondary | ICD-10-CM | POA: Diagnosis not present

## 2021-05-08 DIAGNOSIS — D631 Anemia in chronic kidney disease: Secondary | ICD-10-CM | POA: Diagnosis not present

## 2021-05-08 HISTORY — PX: AMPUTATION: SHX166

## 2021-05-08 LAB — CBC
HCT: 35.9 % — ABNORMAL LOW (ref 39.0–52.0)
Hemoglobin: 11.9 g/dL — ABNORMAL LOW (ref 13.0–17.0)
MCH: 27.7 pg (ref 26.0–34.0)
MCHC: 33.1 g/dL (ref 30.0–36.0)
MCV: 83.7 fL (ref 80.0–100.0)
Platelets: 237 10*3/uL (ref 150–400)
RBC: 4.29 MIL/uL (ref 4.22–5.81)
RDW: 17 % — ABNORMAL HIGH (ref 11.5–15.5)
WBC: 19.7 10*3/uL — ABNORMAL HIGH (ref 4.0–10.5)
nRBC: 0 % (ref 0.0–0.2)

## 2021-05-08 LAB — BLOOD CULTURE ID PANEL (REFLEXED) - BCID2

## 2021-05-08 LAB — GLUCOSE, CAPILLARY
Glucose-Capillary: 116 mg/dL — ABNORMAL HIGH (ref 70–99)
Glucose-Capillary: 118 mg/dL — ABNORMAL HIGH (ref 70–99)
Glucose-Capillary: 129 mg/dL — ABNORMAL HIGH (ref 70–99)
Glucose-Capillary: 124 mg/dL — ABNORMAL HIGH (ref 70–99)
Glucose-Capillary: 240 mg/dL — ABNORMAL HIGH (ref 70–99)

## 2021-05-08 LAB — MRSA NEXT GEN BY PCR, NASAL: MRSA by PCR Next Gen: NOT DETECTED

## 2021-05-08 LAB — BASIC METABOLIC PANEL
Anion gap: 19 — ABNORMAL HIGH (ref 5–15)
BUN: 47 mg/dL — ABNORMAL HIGH (ref 6–20)
CO2: 22 mmol/L (ref 22–32)
Calcium: 8.8 mg/dL — ABNORMAL LOW (ref 8.9–10.3)
Chloride: 93 mmol/L — ABNORMAL LOW (ref 98–111)
Creatinine, Ser: 16.36 mg/dL — ABNORMAL HIGH (ref 0.61–1.24)
GFR, Estimated: 4 mL/min — ABNORMAL LOW (ref >60)
Glucose, Bld: 109 mg/dL — ABNORMAL HIGH (ref 70–99)
Potassium: 3.7 mmol/L (ref 3.5–5.1)
Sodium: 134 mmol/L — ABNORMAL LOW (ref 135–145)

## 2021-05-08 SURGERY — AMPUTATION DIGIT
Anesthesia: General | Laterality: Left

## 2021-05-08 MED ORDER — ALUM & MAG HYDROXIDE-SIMETH 200-200-20 MG/5ML PO SUSP
30.0000 mL | Freq: Four times a day (QID) | ORAL | Status: DC | PRN
  Administered 2021-05-08 – 2021-05-13 (×7): 30 mL via ORAL
  Filled 2021-05-08 (×8): qty 30

## 2021-05-08 MED ORDER — PROPOFOL 10 MG/ML IV BOLUS
INTRAVENOUS | Status: AC
  Filled 2021-05-08: qty 20

## 2021-05-08 MED ORDER — GABAPENTIN 100 MG PO CAPS
100.0000 mg | ORAL_CAPSULE | Freq: Three times a day (TID) | ORAL | Status: DC
  Administered 2021-05-08 – 2021-05-14 (×18): 100 mg via ORAL
  Filled 2021-05-08 (×18): qty 1

## 2021-05-08 MED ORDER — LIDOCAINE 2% (20 MG/ML) 5 ML SYRINGE
INTRAMUSCULAR | Status: DC | PRN
  Administered 2021-05-08: 60 mg via INTRAVENOUS

## 2021-05-08 MED ORDER — FENTANYL CITRATE (PF) 100 MCG/2ML IJ SOLN
INTRAMUSCULAR | Status: AC
  Filled 2021-05-08: qty 2

## 2021-05-08 MED ORDER — LIDOCAINE 2% (20 MG/ML) 5 ML SYRINGE
INTRAMUSCULAR | Status: AC
  Filled 2021-05-08: qty 15

## 2021-05-08 MED ORDER — FENTANYL CITRATE (PF) 100 MCG/2ML IJ SOLN
25.0000 ug | INTRAMUSCULAR | Status: DC | PRN
  Administered 2021-05-08 (×2): 25 ug via INTRAVENOUS

## 2021-05-08 MED ORDER — AMOXICILLIN 500 MG PO CAPS
500.0000 mg | ORAL_CAPSULE | ORAL | Status: DC
  Administered 2021-05-08 – 2021-05-09 (×2): 500 mg via ORAL
  Filled 2021-05-08 (×4): qty 1

## 2021-05-08 MED ORDER — OXYCODONE-ACETAMINOPHEN 7.5-325 MG PO TABS
1.0000 | ORAL_TABLET | Freq: Four times a day (QID) | ORAL | Status: DC | PRN
  Administered 2021-05-08 – 2021-05-14 (×20): 1 via ORAL
  Filled 2021-05-08 (×20): qty 1

## 2021-05-08 MED ORDER — PROPOFOL 10 MG/ML IV BOLUS
INTRAVENOUS | Status: DC | PRN
  Administered 2021-05-08: 10 mg via INTRAVENOUS
  Administered 2021-05-08: 300 mg via INTRAVENOUS

## 2021-05-08 MED ORDER — PHENYLEPHRINE 40 MCG/ML (10ML) SYRINGE FOR IV PUSH (FOR BLOOD PRESSURE SUPPORT)
PREFILLED_SYRINGE | INTRAVENOUS | Status: AC
  Filled 2021-05-08: qty 10

## 2021-05-08 MED ORDER — CHLORHEXIDINE GLUCONATE 0.12 % MT SOLN
OROMUCOSAL | Status: AC
  Administered 2021-05-08: 15 mL via OROMUCOSAL
  Filled 2021-05-08: qty 15

## 2021-05-08 MED ORDER — FENTANYL CITRATE (PF) 250 MCG/5ML IJ SOLN
INTRAMUSCULAR | Status: AC
  Filled 2021-05-08: qty 5

## 2021-05-08 MED ORDER — ONDANSETRON HCL 4 MG/2ML IJ SOLN
INTRAMUSCULAR | Status: AC
  Filled 2021-05-08: qty 2

## 2021-05-08 MED ORDER — MIDAZOLAM HCL 2 MG/2ML IJ SOLN
INTRAMUSCULAR | Status: AC
  Filled 2021-05-08: qty 2

## 2021-05-08 MED ORDER — CHLORHEXIDINE GLUCONATE 0.12 % MT SOLN: 15.0000 mL | Freq: Once | OROMUCOSAL | Status: AC

## 2021-05-08 MED ORDER — PHENYLEPHRINE 40 MCG/ML (10ML) SYRINGE FOR IV PUSH (FOR BLOOD PRESSURE SUPPORT)
PREFILLED_SYRINGE | INTRAVENOUS | Status: DC | PRN
  Administered 2021-05-08: 120 ug via INTRAVENOUS
  Administered 2021-05-08: 160 ug via INTRAVENOUS
  Administered 2021-05-08: 80 ug via INTRAVENOUS

## 2021-05-08 MED ORDER — FENTANYL CITRATE (PF) 250 MCG/5ML IJ SOLN
INTRAMUSCULAR | Status: DC | PRN
  Administered 2021-05-08 (×5): 25 ug via INTRAVENOUS

## 2021-05-08 MED ORDER — OXYCODONE HCL 5 MG/5ML PO SOLN: 5.0000 mg | Freq: Once | ORAL | Status: DC | PRN

## 2021-05-08 MED ORDER — ONDANSETRON HCL 4 MG/2ML IJ SOLN: 4.0000 mg | Freq: Four times a day (QID) | INTRAMUSCULAR | Status: DC | PRN

## 2021-05-08 MED ORDER — OXYCODONE HCL 5 MG PO TABS: 5.0000 mg | ORAL_TABLET | Freq: Once | ORAL | Status: DC | PRN

## 2021-05-08 MED ORDER — ONDANSETRON HCL 4 MG/2ML IJ SOLN
INTRAMUSCULAR | Status: DC | PRN
  Administered 2021-05-08: 4 mg via INTRAVENOUS

## 2021-05-08 MED ORDER — SODIUM CHLORIDE 0.9 % IV SOLN: INTRAVENOUS | Status: DC

## 2021-05-08 SURGICAL SUPPLY — 32 items
BAG COUNTER SPONGE SURGICOUNT (BAG) ×2 IMPLANT
BAG SURGICOUNT SPONGE COUNTING (BAG) ×1
BLADE SAW SGTL 18.5X63.X.64 HD (BLADE) ×2 IMPLANT
BLADE SURG 21 STRL SS (BLADE) ×3 IMPLANT
BNDG COHESIVE 4X5 TAN STRL (GAUZE/BANDAGES/DRESSINGS) ×3 IMPLANT
BNDG COHESIVE 6X5 TAN NS LF (GAUZE/BANDAGES/DRESSINGS) ×2 IMPLANT
BNDG ESMARK 4X9 LF (GAUZE/BANDAGES/DRESSINGS) IMPLANT
BNDG GAUZE ELAST 4 BULKY (GAUZE/BANDAGES/DRESSINGS) ×3 IMPLANT
COVER SURGICAL LIGHT HANDLE (MISCELLANEOUS) ×6 IMPLANT
DRAPE U-SHAPE 47X51 STRL (DRAPES) ×3 IMPLANT
DRSG ADAPTIC 3X8 NADH LF (GAUZE/BANDAGES/DRESSINGS) IMPLANT
DRSG PAD ABDOMINAL 8X10 ST (GAUZE/BANDAGES/DRESSINGS) ×3 IMPLANT
DURAPREP 26ML APPLICATOR (WOUND CARE) ×3 IMPLANT
ELECT REM PT RETURN 9FT ADLT (ELECTROSURGICAL) ×3
ELECTRODE REM PT RTRN 9FT ADLT (ELECTROSURGICAL) ×1 IMPLANT
GAUZE SPONGE 4X4 12PLY STRL (GAUZE/BANDAGES/DRESSINGS) IMPLANT
GAUZE SPONGE 4X4 12PLY STRL LF (GAUZE/BANDAGES/DRESSINGS) ×2 IMPLANT
GAUZE VASELINE 3X9 (GAUZE/BANDAGES/DRESSINGS) ×2 IMPLANT
GLOVE SURG ORTHO LTX SZ9 (GLOVE) ×3 IMPLANT
GLOVE SURG UNDER POLY LF SZ9 (GLOVE) ×3 IMPLANT
GOWN STRL REUS W/ TWL XL LVL3 (GOWN DISPOSABLE) ×2 IMPLANT
GOWN STRL REUS W/TWL XL LVL3 (GOWN DISPOSABLE) ×4
KIT BASIN OR (CUSTOM PROCEDURE TRAY) ×3 IMPLANT
KIT TURNOVER KIT B (KITS) ×3 IMPLANT
MANIFOLD NEPTUNE II (INSTRUMENTS) ×3 IMPLANT
NEEDLE 22X1 1/2 (OR ONLY) (NEEDLE) IMPLANT
NS IRRIG 1000ML POUR BTL (IV SOLUTION) ×3 IMPLANT
PACK ORTHO EXTREMITY (CUSTOM PROCEDURE TRAY) ×3 IMPLANT
PAD ARMBOARD 7.5X6 YLW CONV (MISCELLANEOUS) ×6 IMPLANT
SUT ETHILON 2 0 PSLX (SUTURE) ×3 IMPLANT
SYR CONTROL 10ML LL (SYRINGE) IMPLANT
TOWEL GREEN STERILE (TOWEL DISPOSABLE) ×3 IMPLANT

## 2021-05-08 NOTE — Anesthesia Preprocedure Evaluation (Signed)
Anesthesia Evaluation  Patient identified by MRN, date of birth, ID band Patient awake    Reviewed: Allergy & Precautions, H&P , NPO status , Patient's Chart, lab work & pertinent test results  Airway Mallampati: II   Neck ROM: full    Dental   Pulmonary Patient abstained from smoking.,    breath sounds clear to auscultation       Cardiovascular hypertension,  Rhythm:regular Rate:Normal     Neuro/Psych  Headaches,    GI/Hepatic GERD  ,  Endo/Other  diabetesHypothyroidism   Renal/GU ESRF and DialysisRenal disease     Musculoskeletal   Abdominal   Peds  Hematology   Anesthesia Other Findings   Reproductive/Obstetrics                             Anesthesia Physical Anesthesia Plan  ASA: 3  Anesthesia Plan: General   Post-op Pain Management:    Induction: Intravenous  PONV Risk Score and Plan: 2 and Ondansetron, Treatment may vary due to age or medical condition and Midazolam  Airway Management Planned: LMA  Additional Equipment:   Intra-op Plan:   Post-operative Plan: Extubation in OR  Informed Consent: I have reviewed the patients History and Physical, chart, labs and discussed the procedure including the risks, benefits and alternatives for the proposed anesthesia with the patient or authorized representative who has indicated his/her understanding and acceptance.     Dental advisory given  Plan Discussed with: CRNA, Anesthesiologist and Surgeon  Anesthesia Plan Comments:         Anesthesia Quick Evaluation

## 2021-05-08 NOTE — Op Note (Signed)
05/08/2021  8:41 AM  PATIENT:  Paul [name]    PRE-OPERATIVE DIAGNOSIS:  Osteomyeltits Left Great Toe  POST-OPERATIVE DIAGNOSIS: Osteomyelitis left great toe with abscess that extended proximal to the metatarsal head with necrotic infected soft tissue.  PROCEDURE: Left first ray amputation. Local tissue rearrangement for wound closure 10 x 4 cm. Necrotic soft tissue sent for cultures.  SURGEON:  Newt Minion, MD  PHYSICIAN ASSISTANT:None ANESTHESIA:   General  PREOPERATIVE INDICATIONS:  Paul [name] is a  30 y.o. male with a diagnosis of Osteomyeltits Left Great Toe who failed conservative measures and elected for surgical management.    The risks benefits and alternatives were discussed with the patient preoperatively including but not limited to the risks of infection, bleeding, nerve injury, cardiopulmonary complications, the need for revision surgery, among others, and the patient was willing to proceed.  OPERATIVE IMPLANTS: None  @ENCIMAGES @  OPERATIVE FINDINGS: Patient had extensive abscess with involvement of the soft tissue around the first ray this necessitated a first ray amputation.  Necrotic tissue sent for cultures.  OPERATIVE PROCEDURE: Patient was brought the operating room and underwent a general anesthetic.  After adequate levels anesthesia were obtained patient's left lower extremity was prepped using DuraPrep draped into a sterile field a timeout was called.  A racquet incision was made around the ulcerative tissue in the great toe.  The toe was amputated through the MTP joint.  The soft tissue margins were necrotic with purulent draining abscess.  The incision was then carried more proximal and further soft tissue was resected the necrotic infected soft tissue was sent for cultures.  The first metatarsal head and sesamoids were involved with the infection.  A partial ray amputation was performed through the midshaft of the first metatarsal.  The bone was  resected.  There was an abscess that extended up the FHL tendon.  Further tendon was resected and extensive soft tissue was resected back to healthy viable margins.  The wound was irrigated normal saline electrocardio was used hemostasis.  Local tissue rearrangement was used to close the wound that was 10 x 4 cm.  Sterile dressing was applied patient was extubated taken the PACU in stable condition.   DISCHARGE PLANNING:  Antibiotic duration: Patient will need to continue IV antibiotics during his hospital stay and would discharge on oral antibiotics pending the results of the deep tissue cultures.  Weightbearing: Touchdown weightbearing on the left however I told patient ideally he should be nonweightbearing.  Pain medication: Opioid pathway  Dressing care/ Wound VAC: Reinforce dressing as needed  Ambulatory devices: Walker or crutches  Discharge to: Anticipate discharge to home after tissue cultures are finalized and patient can be discharged on oral antibiotic for 2 weeks.  Follow-up: In the office 1 week post operative.

## 2021-05-08 NOTE — Interval H&P Note (Signed)
History and Physical Interval Note:  05/08/2021 7:36 AM  Paul [name]  has presented today for surgery, with the diagnosis of Osteomyeltits Left Great Toe.  The various methods of treatment have been discussed with the patient and family. After consideration of risks, benefits and other options for treatment, the patient has consented to  Procedure(s): AMPUTATION GREAT TOE (Left) as a surgical intervention.  The patient's history has been reviewed, patient examined, no change in status, stable for surgery.  I have reviewed the patient's chart and labs.  Questions were answered to the patient's satisfaction.     Newt Minion

## 2021-05-08 NOTE — Progress Notes (Signed)
PROGRESS NOTE    Paul [name]  QIW:[id number] DOB: 1990/05/29 DOA: 05/06/2021 PCP: Orvis Brill, DO   Chief Complaint  Patient presents with   Wound Infection   Brief Narrative/Hospital Course:  Paul [name], 30 y.o. male with PMH of ESRD and HD TTS, T2DM, morbid obesity bmi 43, HTN, HLD, anemia of chronic disease, hypothyroidism presented with wound on the left great toe along with redness, pain initially started as a blister and ulcer on the bottom of the left great toe few weeks ago and got worse in last 2 to 3 days. He was seen in the ED systolic blood pressure insulin 80s given IV fluid boluses CT scan of the fluid no periosteal reaction MRI recommended, also had leukocytosis lactic acidosis abnormal LFTs and concern about severe sepsis PCCM was consulted and after discussion transferred under hospitalist for admission for severe sepsis   Subjective: Seen and examined this morning.  He just came back from the OR with left great toe amputation Sister is at the bedside.  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 HD catheter in place- await ID/NEPHRO recs-antibiotics adjusted to cefazolin per ID follow culture Recent Labs  Lab 05/06/21 1600 05/06/21 1740 05/07/21 0312 05/07/21 0610 05/08/21 0621  WBC 19.5*  --  18.2*  --  19.7*  LATICACIDVEN 2.3* 2.0* 1.0 1.2  --      T2DM on insulin  with CKD and diabetic neuropathy: Blood sugar controlled on SSI, Lantus. Stable HbA1c 6 , added Neurontin. Recent Labs  Lab 05/07/21 0740 05/07/21 1220 05/07/21 1529 05/07/21 2034 05/08/21 0603  GLUCAP 156* 126* 106* 131* 116*    ERD:EYCXKGY BP meds due to sepsis.  Patient was placed on midodrine HLD: Continue statin ESRD and HD TTS: Nephrology consulted for dialysis today  Anemia of chronic disease: Monitor hemoglobin Recent Labs  Lab 05/06/21 1600 05/07/21 0312 05/08/21 0621  HGB 14.3 11.9* 11.9*  HCT 43.0 36.8* 18.5*    Metabolic bone disease: Monitor calcium and Phos Hypokalemia- stable. Recent Labs  Lab 05/06/21 1600 05/07/21 0312 05/08/21 0621  K 3.8 3.2* 3.7    Hypothyroidism: on Synthroid Class III Obesity:Patient's Body mass index is 43.41 kg/m. : Will benefit with PCP follow-up, weight loss  healthy lifestyle and outpatient sleep evaluation.   DVT prophylaxis: heparin injection 5,000 Units Start: 05/07/21 1400 Code Status:   Code Status: Full Code Family Communication: plan of care discussed with patient at bedside. Status is: Inpatient Remains inpatient appropriate because: For IV antibiotics and management of osteomyelitis Disposition: Currently not medically stable for discharge. Anticipated Disposition: TBD  Objective: Vitals last 24 hrs: Vitals:   05/07/21 2300 05/08/21 0440 05/08/21 0611 05/08/21 0708  BP: (!) 100/55 100/64 (!) 106/57   Pulse: (!) 104 (!) 101 97 98  Resp: 20 18 14 16   Temp: 99.7 F (37.6 C) 98 F (36.7 C) 98.8 F (37.1 C)   TempSrc: Oral Oral Oral   SpO2: 100% 100% 100% 100%  Weight:  Height:       Weight change:  No intake or output data in the 24 hours ending 05/08/21 0755 Net IO Since Admission: 1,972.94 mL [05/08/21 0755]   Physical Examination: General exam: AA0X3, weak,older than stated age. HEENT:Oral mucosa moist, Ear/Nose WNL grossly,dentition normal. Respiratory system: B/l clear BS, HD catheter in the  right chest, no use of accessory muscle, non tender. Cardiovascular system: S1 & S2 +,No JVD. Gastrointestinal system: Abdomen soft, NT,ND, BS+. Nervous System:Alert, awake, moving extremities. Extremities: edema none, surgical site with dressing in place distal peripheral pulses palpable.  Skin: No rashes, no icterus. MSK: Normal muscle bulk, tone, power.  Medications reviewed:  Scheduled Meds:  [MAR Hold] Chlorhexidine Gluconate Cloth  6 each Topical Q0600   [MAR Hold] cinacalcet  60 mg Oral QODAY   [MAR Hold] heparin  5,000 Units Subcutaneous Q8H   [MAR Hold] insulin aspart  0-6 Units Subcutaneous TID WC & HS   [MAR Hold] insulin glargine-yfgn  5 Units Subcutaneous Daily   [MAR Hold] levothyroxine  75 mcg Oral QAC breakfast   [MAR Hold] melatonin  3 mg Oral QHS   [MAR Hold] midodrine  5 mg Oral TID WC   [MAR Hold] multivitamin  1 tablet Oral QPM   [MAR Hold] rosuvastatin  20 mg Oral Daily   [MAR Hold] sodium chloride flush  3 mL Intravenous Q12H   Continuous Infusions:  [MAR Hold] sodium chloride     sodium chloride 10 mL/hr at 05/08/21 0712   [MAR Hold]  ceFAZolin (ANCEF) IV      ceFAZolin (ANCEF) IV      Diet Order             Diet NPO time specified  Diet effective ____                          Weight change:   Wt Readings from Last 3 Encounters:  05/06/21 (!) 149.2 kg  03/26/21 (!) 149.7 kg  03/17/21 (!) 151 kg     Consultants:see note  Procedures:see note Antimicrobials: Anti-infectives (From admission, onward)    Start     Dose/Rate Route Frequency Ordered Stop   05/08/21 1800  [MAR Hold]  ceFAZolin (ANCEF) IVPB 2g/100 mL premix        (MAR Hold since Sat 05/08/2021 at 0652.Hold Reason: Transfer to a Procedural area)   2 g 200 mL/hr over 30 Minutes Intravenous Every T-Th-Sa (1800) 05/07/21 1239     05/08/21 1200  vancomycin (VANCOCIN) IVPB 1000 mg/200 mL premix  Status:  Discontinued        1,000 mg 200 mL/hr over 60 Minutes Intravenous Every  T-Th-Sa (Hemodialysis) 05/07/21 0233 05/07/21 1239   05/08/21 0600  ceFAZolin (ANCEF) IVPB 3g/100 mL premix        3 g 200 mL/hr over 30 Minutes Intravenous On call to O.R. 05/07/21 1857 05/09/21 0559   05/07/21 1530  ceFAZolin (ANCEF) IVPB 2g/100 mL premix  Status:  Discontinued        2 g 200 mL/hr over 30 Minutes Intravenous To Radiology 05/07/21 1436 05/07/21 1615   05/06/21 1700  vancomycin (VANCOCIN) IVPB 1000 mg/200 mL premix        1,000 mg 200 mL/hr over 60 Minutes Intravenous Every 1 hr x 2 05/06/21 1621 05/06/21 2005   05/06/21 1630  ceFEPIme (MAXIPIME) 2 g in sodium chloride 0.9 % 100 mL IVPB        2 g 200 mL/hr  over 30 Minutes Intravenous  Once 05/06/21 1621 05/06/21 1701   05/06/21 1630  metroNIDAZOLE (FLAGYL) IVPB 500 mg        500 mg 100 mL/hr over 60 Minutes Intravenous  Once 05/06/21 1621 05/06/21 1728   05/06/21 1615  piperacillin-tazobactam (ZOSYN) IVPB 2.25 g  Status:  Discontinued        2.25 g 100 mL/hr over 30 Minutes Intravenous  Once 05/06/21 1614 05/06/21 1621      Culture/Microbiology    Component Value Date/Time   SDES  05/06/2021 1947    TOE Performed at [facility], [address], [place], [place] [address]    [facility]  05/06/2021 1947    Immunocompromised Performed at [facility], [address] [place], [place] [address]    CULT PENDING 05/06/2021 1947   REPTSTATUS PENDING 05/06/2021 1947    Other culture-see note  Unresulted Labs (From admission, onward)     Start     Ordered   05/08/21 0500  CBC  Daily,   R     Question:  Specimen collection method  Answer:  Lab=Lab collect   05/07/21 0745   05/08/21 0500  Culture, blood (routine x 2)  BLOOD CULTURE X 2,   R (with TIMED occurrences)      05/07/21 1401   05/07/21 1437  Hepatitis B surface antibody,quantitative  (New Admission Hemo Labs (Hepatitis B))  Once,   R       Question:  Specimen collection method  Answer:  Lab=Lab collect   05/07/21 1436    05/06/21 1602  Urinalysis, Routine w reflex microscopic  (Septic presentation on arrival (screening labs, nursing and treatment orders for obvious sepsis))  ONCE - STAT,   STAT        05/06/21 1605          Data Reviewed: I have personally reviewed following labs and imaging studies CBC: Recent Labs  Lab 05/06/21 1600 05/07/21 0312 05/08/21 0621  WBC 19.5* 18.2* 19.7*  NEUTROABS 15.0*  --   --   HGB 14.3 11.9* 11.9*  HCT 43.0 36.8* 35.9*  MCV 84.0 84.0 83.7  PLT 226 202 196   Basic Metabolic Panel: Recent Labs  Lab 05/06/21 1600 05/07/21 0312 05/08/21 0621  NA 134* 132* 134*  K 3.8 3.2* 3.7  CL 92* 94* 93*  CO2 25 23 22   GLUCOSE 163* 158* 109*  BUN 30* 32* 47*  CREATININE 11.66* 13.15* 16.36*  CALCIUM 9.7 9.2 8.8*   GFR: Estimated Creatinine Clearance: 10.2 mL/min (A) (by C-G formula based on SCr of 16.36 mg/dL (H)). Liver Function Tests: Recent Labs  Lab 05/06/21 1600  AST 21  ALT 12  ALKPHOS 69  BILITOT 0.7  PROT 9.8*  ALBUMIN 4.3   No results for input(s): LIPASE, AMYLASE in the last 168 hours. No results for input(s): AMMONIA in the last 168 hours. Coagulation Profile: Recent Labs  Lab 05/06/21 1600  INR 1.3*   Cardiac Enzymes: No results for input(s): CKTOTAL, CKMB, CKMBINDEX, TROPONINI in the last 168 hours. BNP (last 3 results) No results for input(s): PROBNP in the last 8760 hours. HbA1C: Recent Labs    05/07/21 0312  HGBA1C 6.0*   CBG: Recent Labs  Lab 05/07/21 0740 05/07/21 1220 05/07/21 1529 05/07/21 2034 05/08/21 0603  GLUCAP 156* 126* 106* 131* 116*   Lipid Profile: No results for input(s): CHOL, HDL, LDLCALC, TRIG, CHOLHDL, LDLDIRECT in the last 72 hours. Thyroid Function Tests: No  results for input(s): TSH, T4TOTAL, FREET4, T3FREE, THYROIDAB in the last 72 hours. Anemia Panel: No results for input(s): VITAMINB12, FOLATE, FERRITIN, TIBC, IRON, RETICCTPCT in the last 72 hours. Sepsis Labs: Recent Labs  Lab  05/06/21 1600 05/06/21 1740 05/07/21 0312 05/07/21 0610  LATICACIDVEN 2.3* 2.0* 1.0 1.2    Recent Results (from the past 240 hour(s))  Resp Panel by RT-PCR (Flu A&B, Covid) Nasopharyngeal Swab     Status: None   Collection Time: 05/06/21  3:37 PM   Specimen: Nasopharyngeal Swab; Nasopharyngeal(NP) swabs in vial transport medium  Result Value Ref Range Status   SARS Coronavirus 2 by RT PCR NEGATIVE NEGATIVE Final    Comment: (NOTE) SARS-CoV-2 target nucleic acids are NOT DETECTED.  The SARS-CoV-2 RNA is generally detectable in upper respiratory specimens during the acute phase of infection. The lowest concentration of SARS-CoV-2 viral copies this assay can detect is 138 copies/mL. A negative result does not preclude SARS-Cov-2 infection and should not be used as the sole basis for treatment or other patient management decisions. A negative result may occur with  improper specimen collection/handling, submission of specimen other than nasopharyngeal swab, presence of viral mutation(s) within the areas targeted by this assay, and inadequate number of viral copies(<138 copies/mL). A negative result must be combined with clinical observations, patient history, and epidemiological information. The expected result is Negative.  Fact Sheet for Patients:  EntrepreneurPulse.com.au  Fact Sheet for Healthcare Providers:  IncredibleEmployment.be  This test is no t yet approved or cleared by the [place] FDA and  has been authorized for detection and/or diagnosis of SARS-CoV-2 by FDA under an Emergency Use Authorization (EUA). This EUA will remain  in effect (meaning this test can be used) for the duration of the COVID-19 declaration under Section 564(b)(1) of the Act, 21 U.S.C.section 360bbb-3(b)(1), unless the authorization is terminated  or revoked sooner.       Influenza A by PCR NEGATIVE NEGATIVE Final   Influenza B by PCR NEGATIVE  NEGATIVE Final    Comment: (NOTE) The Xpert Xpress SARS-CoV-2/FLU/RSV plus assay is intended as an aid in the diagnosis of influenza from Nasopharyngeal swab specimens and should not be used as a sole basis for treatment. Nasal washings and aspirates are unacceptable for Xpert Xpress SARS-CoV-2/FLU/RSV testing.  Fact Sheet for Patients: EntrepreneurPulse.com.au  Fact Sheet for Healthcare Providers: IncredibleEmployment.be  This test is not yet approved or cleared by the [place] FDA and has been authorized for detection and/or diagnosis of SARS-CoV-2 by FDA under an Emergency Use Authorization (EUA). This EUA will remain in effect (meaning this test can be used) for the duration of the COVID-19 declaration under Section 564(b)(1) of the Act, 21 U.S.C. section 360bbb-3(b)(1), unless the authorization is terminated or revoked.  Performed at [facility], [address] [place], [place] [address]   Culture, blood (x 2)     Status: None (Preliminary result)   Collection Time: 05/06/21  4:00 PM   Specimen: BLOOD  Result Value Ref Range Status   Specimen Description   Final    BLOOD BLOOD RIGHT HAND Performed at [facility], [address] [place], [place] [address]    Special Requests   Final    BOTTLES DRAWN AEROBIC ONLY Blood Culture results may not be optimal due to an inadequate volume of blood received in culture bottles Performed at [facility], [address] [place], [place] [address]    Culture  Setup  Time   Final    GRAM POSITIVE COCCI AEROBIC BOTTLE ONLY CRITICAL RESULT CALLED TO, READ BACK BY AND VERIFIED WITH: PHARMD GREG ABBOTT 05/08/21@4 :17 BY TW    Culture   Final    NO GROWTH < 12 HOURS Performed at [facility], [address], [place], [place] [address]    Report Status PENDING  Incomplete  Blood Culture ID Panel (Reflexed)     Status: Abnormal   Collection Time: 05/06/21   4:00 PM  Result Value Ref Range Status   Enterococcus faecalis NOT DETECTED NOT DETECTED Final   Enterococcus Faecium NOT DETECTED NOT DETECTED Final   Listeria monocytogenes NOT DETECTED NOT DETECTED Final   Staphylococcus species DETECTED (A) NOT DETECTED Final    Comment: CRITICAL RESULT CALLED TO, READ BACK BY AND VERIFIED WITH: PHARMD GREG ABBOTT 05/08/21@4 :17 BY TW    Staphylococcus aureus (BCID) DETECTED (A) NOT DETECTED Final    Comment: CRITICAL RESULT CALLED TO, READ BACK BY AND VERIFIED WITH: PHARMD GREG ABBOTT 05/08/21@4 :17 BY TW    Staphylococcus epidermidis NOT DETECTED NOT DETECTED Final   Staphylococcus lugdunensis NOT DETECTED NOT DETECTED Final   Streptococcus species NOT DETECTED NOT DETECTED Final   Streptococcus agalactiae NOT DETECTED NOT DETECTED Final   Streptococcus pneumoniae NOT DETECTED NOT DETECTED Final   Streptococcus pyogenes NOT DETECTED NOT DETECTED Final   A.calcoaceticus-baumannii NOT DETECTED NOT DETECTED Final   Bacteroides fragilis NOT DETECTED NOT DETECTED Final   Enterobacterales NOT DETECTED NOT DETECTED Final   Enterobacter cloacae complex NOT DETECTED NOT DETECTED Final   Escherichia coli NOT DETECTED NOT DETECTED Final   Klebsiella aerogenes NOT DETECTED NOT DETECTED Final   Klebsiella oxytoca NOT DETECTED NOT DETECTED Final   Klebsiella pneumoniae NOT DETECTED NOT DETECTED Final   Proteus species NOT DETECTED NOT DETECTED Final   Salmonella species NOT DETECTED NOT DETECTED Final   Serratia marcescens NOT DETECTED NOT DETECTED Final   Haemophilus influenzae NOT DETECTED NOT DETECTED Final   Neisseria meningitidis NOT DETECTED NOT DETECTED Final   Pseudomonas aeruginosa NOT DETECTED NOT DETECTED Final   Stenotrophomonas maltophilia NOT DETECTED NOT DETECTED Final   Candida albicans NOT DETECTED NOT DETECTED Final   Candida auris NOT DETECTED NOT DETECTED Final   Candida glabrata NOT DETECTED NOT DETECTED Final   Candida krusei NOT  DETECTED NOT DETECTED Final   Candida parapsilosis NOT DETECTED NOT DETECTED Final   Candida tropicalis NOT DETECTED NOT DETECTED Final   Cryptococcus neoformans/gattii NOT DETECTED NOT DETECTED Final   Meth resistant mecA/C and MREJ NOT DETECTED NOT DETECTED Final    Comment: Performed at [facility], [address] [place], [place] [address]  Culture, blood (x 2)     Status: None (Preliminary result)   Collection Time: 05/06/21  4:15 PM   Specimen: BLOOD  Result Value Ref Range Status   Specimen Description   Final    BLOOD BLOOD RIGHT HAND Performed at [facility], [address] [place], [place] [address]    Special Requests   Final    BOTTLES DRAWN AEROBIC AND ANAEROBIC Blood Culture adequate volume Performed at [facility], [address] [place], [place] [address]    Culture  Setup Time   Final    GRAM POSITIVE COCCI IN CLUSTERS IN BOTH AEROBIC AND ANAEROBIC BOTTLES CRITICAL RESULT CALLED TO, READ BACK BY AND VERIFIED WITH: PHARMD J.FRENS AT 2956 ON 05/07/2021 BY T.SAAD. Performed at [facility], [address], [place], [place] [address]    Culture GRAM POSITIVE COCCI  Final   Report Status PENDING  Incomplete  Blood Culture ID Panel (Reflexed)     Status: Abnormal   Collection Time: 05/06/21  4:15 PM  Result Value Ref Range Status   Enterococcus faecalis NOT DETECTED NOT DETECTED Final   Enterococcus Faecium NOT DETECTED NOT DETECTED Final   Listeria monocytogenes NOT DETECTED NOT DETECTED Final   Staphylococcus species DETECTED (A) NOT DETECTED Final    Comment: CRITICAL RESULT CALLED TO, READ BACK BY AND VERIFIED WITH: PHARMD J.FRENS AT 5852 ON 05/07/2021 BY T.SAAD.    Staphylococcus aureus (BCID) DETECTED (A) NOT DETECTED Final    Comment: CRITICAL RESULT CALLED TO, READ BACK BY AND VERIFIED WITH: PHARMD J.FRENS AT 7782 ON 05/07/2021 BY T.SAAD.    Staphylococcus epidermidis NOT DETECTED NOT DETECTED Final   Staphylococcus  lugdunensis NOT DETECTED NOT DETECTED Final   Streptococcus species NOT DETECTED NOT DETECTED Final   Streptococcus agalactiae NOT DETECTED NOT DETECTED Final   Streptococcus pneumoniae NOT DETECTED NOT DETECTED Final   Streptococcus pyogenes NOT DETECTED NOT DETECTED Final   A.calcoaceticus-baumannii NOT DETECTED NOT DETECTED Final   Bacteroides fragilis NOT DETECTED NOT DETECTED Final   Enterobacterales NOT DETECTED NOT DETECTED Final   Enterobacter cloacae complex NOT DETECTED NOT DETECTED Final   Escherichia coli NOT DETECTED NOT DETECTED Final   Klebsiella aerogenes NOT DETECTED NOT DETECTED Final   Klebsiella oxytoca NOT DETECTED NOT DETECTED Final   Klebsiella pneumoniae NOT DETECTED NOT DETECTED Final   Proteus species NOT DETECTED NOT DETECTED Final   Salmonella species NOT DETECTED NOT DETECTED Final   Serratia marcescens NOT DETECTED NOT DETECTED Final   Haemophilus influenzae NOT DETECTED NOT DETECTED Final   Neisseria meningitidis NOT DETECTED NOT DETECTED Final   Pseudomonas aeruginosa NOT DETECTED NOT DETECTED Final   Stenotrophomonas maltophilia NOT DETECTED NOT DETECTED Final   Candida albicans NOT DETECTED NOT DETECTED Final   Candida auris NOT DETECTED NOT DETECTED Final   Candida glabrata NOT DETECTED NOT DETECTED Final   Candida krusei NOT DETECTED NOT DETECTED Final   Candida parapsilosis NOT DETECTED NOT DETECTED Final   Candida tropicalis NOT DETECTED NOT DETECTED Final   Cryptococcus neoformans/gattii NOT DETECTED NOT DETECTED Final   Meth resistant mecA/C and MREJ NOT DETECTED NOT DETECTED Final    Comment: Performed at [facility], [address] [place], [place] [address]  Aerobic/Anaerobic Culture w Gram Stain (surgical/deep wound)     Status: None (Preliminary result)   Collection Time: 05/06/21  7:47 PM   Specimen: Toe; Wound  Result Value Ref Range Status   Specimen Description   Final    TOE Performed at [facility], [address] [place], [place] [address]    Special Requests   Final    Immunocompromised Performed at [facility], [address] [place], [place] [address]    Gram Stain   Final    NO WBC SEEN MODERATE GRAM POSITIVE COCCI IN PAIRS Performed at [facility], [place] [address], [place],  [address]    Culture PENDING  Incomplete   Report Status PENDING  Incomplete  MRSA Next Gen by PCR, Nasal     Status: None   Collection Time: 05/08/21  2:17 AM   Specimen: Nasal Mucosa; Nasal Swab  Result Value Ref Range Status   MRSA by PCR Next Gen NOT  DETECTED NOT DETECTED Final    Comment: (NOTE) The GeneXpert MRSA Assay (FDA approved for NASAL specimens only), is one component of a comprehensive MRSA colonization surveillance program. It is not intended to diagnose MRSA infection nor to guide or monitor treatment for MRSA infections. Test performance is not FDA approved in patients less than 62 years old. Performed at [facility], [place] [address] [place], [place] [address]      Radiology Studies: CT Foot Left Wo Contrast  Result Date: 05/06/2021 CLINICAL DATA:  Foot wound infection.  Osteomyelitis suspected EXAM: CT OF THE LEFT FOOT WITHOUT CONTRAST TECHNIQUE: Multidetector CT imaging of the left foot was performed according to the standard protocol. Multiplanar CT image reconstructions were also generated. COMPARISON:  None. FINDINGS: Bones/Joint/Cartilage No cortical erosion or periosteal reaction to suggest acute osteomyelitis. No evidence of fracture or dislocation Ligaments Suboptimally assessed by CT. Muscles and Tendons Muscles are normal in bulk and density. No intramuscular fluid collection. Soft tissues Skin thickening and marked subcutaneous soft tissue edema about the first digit. There is deep skin wound on the plantar aspect of the first digit about the interphalangeal joint. No drainable fluid collection or abscess on this noncontrast enhanced examination.  IMPRESSION: 1. Deep skin wound about the plantar aspect of the interphalangeal joint of the first digit. Skin thickening and subcutaneous soft tissue edema consistent with cellulitis. No cortical erosion or periosteal reaction suggesting osteomyelitis, however early osteomyelitis can not be excluded on CT examination. Further evaluation with MRI examination is recommended. 2. No drainable fluid collection or abscess on this noncontrast enhanced examination. Electronically Signed   By: Keane Police D.O.   On: 05/06/2021 18:11   MR FOOT LEFT WO CONTRAST  Result Date: 05/07/2021 CLINICAL DATA:  Left great toe ulcer.  Left foot pain and redness. EXAM: MRI OF THE LEFT FOOT WITHOUT CONTRAST TECHNIQUE: Multiplanar, multisequence MR imaging of the left forefoot was performed. No intravenous contrast was administered. COMPARISON:  CT left foot from yesterday. FINDINGS: Bones/Joint/Cartilage Fairly confluent marrow edema involving the first proximal and distal phalanges, as well as the hallux sesamoids, with patchy T1 hypointensity, consistent with osteomyelitis. No fracture or dislocation. Joint spaces are preserved. No significant joint effusion. Ligaments Collateral ligaments are intact. Muscles and Tendons Partially torn distal flexor hallucis longus tendon with small amount of fluid surrounding the tendon. Remaining flexor and extensor tendons are intact. Increased T2 signal within the intrinsic muscles of the forefoot, nonspecific, but likely related to diabetic muscle changes. Soft tissue Soft tissue ulceration at the plantar base of the first IP joint. 2.0 x 0.7 x 1.3 cm fluid collection along the lateral aspect of the first proximal phalanx (series 7, image 22). IMPRESSION: 1. Soft tissue ulceration at the plantar base of the first IP joint with underlying osteomyelitis of the first proximal and distal phalanges, as well as the hallux sesamoids. 2. 2.0 cm abscess along the lateral aspect of the first proximal  phalanx. 3. Partially torn distal flexor hallucis longus tendon with mild infectious tenosynovitis. Electronically Signed   By: Titus Dubin M.D.   On: 05/07/2021 10:45   DG Chest Port 1 View  Result Date: 05/06/2021 CLINICAL DATA:  Possible septicemia EXAM: PORTABLE CHEST 1 VIEW COMPARISON:  Previous studies including the examination 1031 2019 FINDINGS: Transverse diameter of heart is in the upper limits of normal. Central pulmonary vessels are prominent. There are no signs of alveolar pulmonary edema or focal pulmonary consolidation. Right hemidiaphragm is elevated. There is crowding  of markings in the right lower lung fields. There is no pleural effusion or pneumothorax. Tip dialysis catheter is seen at the junction of superior vena cava and right atrium. IMPRESSION: There are no signs of pulmonary edema or focal pulmonary consolidation. Electronically Signed   By: Elmer Picker M.D.   On: 05/06/2021 16:38   VAS [place] ABI WITH/WO TBI  Result Date: 05/07/2021  LOWER EXTREMITY DOPPLER STUDY Patient Name:  ANOOP HEMMER  Date of Exam:   05/07/2021 Medical Rec #: [id number]      Accession #:    [id number] Date of Birth: 1991-01-15       Patient Gender: M Patient Age:   34 years Exam Location:  [facility] Procedure:      VAS [place] ABI WITH/WO TBI Referring Phys: Gustin DUDA --------------------------------------------------------------------------------  Indications: Left great toe ulceration. High Risk Factors: Hyperlipidemia, Diabetes. Other Factors: ESRD on dialysis.  Limitations: Today's exam was limited due to Bandaging, history of multiple UE              dialysis accesses bilaterally. Comparison Study: No prior studies. Performing Technologist: Darlin Coco RDMS RVT  Examination Guidelines: A complete evaluation includes at minimum, Doppler waveform signals and systolic blood pressure reading at the level of bilateral brachial, anterior tibial, and posterior tibial arteries, when vessel  segments are accessible. Bilateral testing is considered an integral part of a complete examination. Photoelectric Plethysmograph (PPG) waveforms and toe systolic pressure readings are included as required and additional duplex testing as needed. Limited examinations for reoccurring indications may be performed as noted.  ABI Findings: +---------+------------------+-----+---------+---------------------------------+  Right     Rt Pressure (mmHg) Index Waveform  Comment                            +---------+------------------+-----+---------+---------------------------------+  Brachial                           triphasic Brachial waveform only, unable to                                                obtain pressure due to hx of                                                     dialysis access                    +---------+------------------+-----+---------+---------------------------------+  PTA       128                1.28  triphasic                                    +---------+------------------+-----+---------+---------------------------------+  DP        102                1.02  triphasic                                    +---------+------------------+-----+---------+---------------------------------+  Great Toe 70                 0.70  Normal                                       +---------+------------------+-----+---------+---------------------------------+ +---------+------------------+-----+---------+---------------------------------+  Left      Lt Pressure (mmHg) Index Waveform  Comment                            +---------+------------------+-----+---------+---------------------------------+  Brachial  100                      triphasic Brachial waveform, radial/forearm                                                pressure due to hx of dialysis                                                   access                              +---------+------------------+-----+---------+---------------------------------+  PTA       126                1.26  triphasic                                    +---------+------------------+-----+---------+---------------------------------+  DP        114                1.14  triphasic                                    +---------+------------------+-----+---------+---------------------------------+  Great Toe                                    Unable to obtain due to                                                          wound/bandaging                    +---------+------------------+-----+---------+---------------------------------+  Summary: Right: Resting right ankle-brachial index is within normal range. No evidence of significant right lower extremity arterial disease. The right toe-brachial index is normal. Left: Resting left ankle-brachial index is within normal range. No evidence of significant left lower extremity arterial disease.  *See table(s) above for measurements and observations.     Preliminary      LOS: 1 day   Antonieta Pert, MD [facility]  05/08/2021, 7:55 AM

## 2021-05-08 NOTE — Transfer of Care (Signed)
Immediate Anesthesia Transfer of Care Note  Patient: Paul [name]  Procedure(s) Performed: AMPUTATION GREAT TOE (Left)  Patient Location: PACU  Anesthesia Type:General  Level of Consciousness: patient cooperative and responds to stimulation  Airway & Oxygen Therapy: Patient Spontanous Breathing  Post-op Assessment: Report given to RN and Post -op Vital signs reviewed and stable  Post vital signs: Reviewed and stable  Last Vitals:  Vitals Value Taken Time  BP 109/57 05/08/21 0833  Temp    Pulse 101 05/08/21 0834  Resp 22 05/08/21 0834  SpO2 98 % 05/08/21 0834  Vitals shown include unvalidated device data.  Last Pain:  Vitals:   05/08/21 0708  TempSrc:   PainSc: 6          Complications: No notable events documented.

## 2021-05-08 NOTE — Progress Notes (Signed)
[facility] Progress Note  Subjective: seen on HD, had 1st ray amp surgery this am.  No c/o  Vitals:   05/08/21 1530 05/08/21 1600 05/08/21 1630 05/08/21 1659  BP: (!) 105/52 101/70 (!) 143/50 111/68  Pulse: 93 (!) 110 (!) 105 98  Resp:    13  Temp:    99.1 F (37.3 C)  TempSrc:    Oral  SpO2:    100%  Weight:    (!) 144.7 kg  Height:        Exam: Gen alert, no distress No jvd or bruits Chest clear bilat RRR no RG Abd soft ntnd no mass or ascites +bs obese Ext trace pretib edema Neuro is alert, Ox 3 , nf  RIJ TDC intact            Home meds include - norvasc 5mg , sensipar, auryxia, lantus 10-15u bid, synthroid, rena-vit, crestor, prns/ vits/ supps   CXR - IMPRESSION: There are no signs of pulmonary edema or focal pulmonary consolidation.    OP HD: [facility]   4.5h   500/1.5   145kg  2/2 bath P2 TDC  Hep 8000  - hect 5ug tiw  - no esa   Assessment/ Plan: Sepsis/ diabetic L foot infection - w/ ^WBC, hypotension. Getting IV abx. MRI showed 1st toe infection, sp 1st ray amputation today 12/17 by Dr Sharol Given.  ESRD - on HD TTS. Next HD today.  HD access - on HD 3 yrs. 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 Supposed to see [facility] soon about next access. Using [facility] now.  HTN - BP's soft likely due to infection. Better today.  DM2 - on insulin.  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 Cont sensipar and [place].  Anemia ckd - Hb >10, no esa at OP unit. Follow.           Rob Doctor, hospital 05/08/2021, 5:27 PM   Recent Labs  Lab 05/07/21 0312 05/08/21 0621  K 3.2* 3.7  BUN 32* 47*  CREATININE 13.15* 16.36*  CALCIUM 9.2 8.8*  HGB 11.9* 11.9*   Inpatient medications:  amoxicillin  500 mg Oral Q24H   Chlorhexidine Gluconate Cloth  6 each Topical Q0600   cinacalcet  60 mg Oral QODAY   fentaNYL       gabapentin  100 mg Oral TID   heparin  5,000 Units Subcutaneous Q8H   insulin aspart  0-6 Units Subcutaneous TID WC & HS   insulin glargine-yfgn  5 Units Subcutaneous  Daily   levothyroxine  75 mcg Oral QAC breakfast   melatonin  3 mg Oral QHS   midodrine  5 mg Oral TID WC   multivitamin  1 tablet Oral QPM   rosuvastatin  20 mg Oral Daily   sodium chloride flush  3 mL Intravenous Q12H    sodium chloride      ceFAZolin (ANCEF) IV     sodium chloride, acetaminophen **OR** acetaminophen, alum & mag hydroxide-simeth, calcium carbonate, heparin, ondansetron **OR** ondansetron (ZOFRAN) IV, oxyCODONE-acetaminophen, senna-docusate, sodium chloride flush

## 2021-05-08 NOTE — Anesthesia Procedure Notes (Signed)
Procedure Name: LMA Insertion Date/Time: 05/08/2021 7:54 AM Performed by: Betha Loa, CRNA Pre-anesthesia Checklist: Patient identified, Emergency Drugs available, Suction available and Patient being monitored Patient Re-evaluated:Patient Re-evaluated prior to induction Oxygen Delivery Method: Circle System Utilized Preoxygenation: Pre-oxygenation with 100% oxygen Induction Type: IV induction Ventilation: Mask ventilation without difficulty LMA: LMA inserted LMA Size: 5.0 Number of attempts: 1 Placement Confirmation: positive ETCO2 Tube secured with: Tape Dental Injury: Teeth and Oropharynx as per pre-operative assessment

## 2021-05-09 ENCOUNTER — Inpatient Hospital Stay (HOSPITAL_COMMUNITY): Payer: Medicaid Other

## 2021-05-09 DIAGNOSIS — B9561 Methicillin susceptible Staphylococcus aureus infection as the cause of diseases classified elsewhere: Secondary | ICD-10-CM | POA: Diagnosis not present

## 2021-05-09 DIAGNOSIS — R7881 Bacteremia: Secondary | ICD-10-CM | POA: Diagnosis not present

## 2021-05-09 LAB — ECHOCARDIOGRAM COMPLETE
AR max vel: 3.18 cm2
AV Peak grad: 7.2 mmHg
Ao pk vel: 1.34 m/s
Area-P 1/2: 4.89 cm2
Calc EF: 62.9 %
Height: 73 in
S' Lateral: 2.9 cm
Single Plane A2C EF: 62.4 %
Single Plane A4C EF: 62.6 %
Weight: 5104.09 oz

## 2021-05-09 LAB — CULTURE, BLOOD (ROUTINE X 2): Special Requests: ADEQUATE

## 2021-05-09 LAB — CBC
HCT: 35.9 % — ABNORMAL LOW (ref 39.0–52.0)
Hemoglobin: 11.4 g/dL — ABNORMAL LOW (ref 13.0–17.0)
MCH: 26.8 pg (ref 26.0–34.0)
MCHC: 31.8 g/dL (ref 30.0–36.0)
MCV: 84.3 fL (ref 80.0–100.0)
Platelets: 230 10*3/uL (ref 150–400)
RBC: 4.26 MIL/uL (ref 4.22–5.81)
RDW: 16.9 % — ABNORMAL HIGH (ref 11.5–15.5)
WBC: 12.6 10*3/uL — ABNORMAL HIGH (ref 4.0–10.5)
nRBC: 0 % (ref 0.0–0.2)

## 2021-05-09 LAB — GLUCOSE, CAPILLARY
Glucose-Capillary: 221 mg/dL — ABNORMAL HIGH (ref 70–99)
Glucose-Capillary: 121 mg/dL — ABNORMAL HIGH (ref 70–99)
Glucose-Capillary: 149 mg/dL — ABNORMAL HIGH (ref 70–99)
Glucose-Capillary: 104 mg/dL — ABNORMAL HIGH (ref 70–99)

## 2021-05-09 MED ORDER — CALCIUM CARBONATE ANTACID 500 MG PO CHEW
1000.0000 mg | CHEWABLE_TABLET | Freq: Every day | ORAL | Status: DC | PRN
Start: 2021-05-09 — End: 2021-05-14

## 2021-05-09 MED ORDER — FERRIC CITRATE 1 GM 210 MG(FE) PO TABS
630.0000 mg | ORAL_TABLET | Freq: Three times a day (TID) | ORAL | Status: DC
  Administered 2021-05-09 – 2021-05-14 (×4): 630 mg via ORAL
  Filled 2021-05-09 (×16): qty 3

## 2021-05-09 MED ORDER — ACETAMINOPHEN 500 MG PO TABS: 1000.0000 mg | ORAL_TABLET | Freq: Four times a day (QID) | ORAL | Status: DC | PRN

## 2021-05-09 MED ORDER — FERRIC CITRATE 1 GM 210 MG(FE) PO TABS
210.0000 mg | ORAL_TABLET | ORAL | Status: DC
  Administered 2021-05-09: 22:00:00 210 mg via ORAL
  Filled 2021-05-09 (×4): qty 1

## 2021-05-09 MED ORDER — FERRIC CITRATE 1 GM 210 MG(FE) PO TABS: 420.0000 mg | ORAL_TABLET | ORAL | Status: DC

## 2021-05-09 NOTE — Progress Notes (Signed)
Mobility Specialist Progress Note    05/09/21 1421  Mobility  Activity Ambulated in hall  Level of Assistance Contact guard assist, steadying assist  Mountain wheel walker;Crutches  Distance Ambulated (ft) 130 ft [phone])  Mobility Ambulated with assistance in hallway  Mobility Response Tolerated well  Mobility performed by Mobility specialist  $Mobility charge 1 Mobility   Pt received in bed and agreeable. Pt wanted to try using both a walker and crutches. Took a seated rest break between each bout. Roughly maintained NWB but occasionally TDWB or PWB with foot during transition from STS. Returned to bed with call bell in reach.   [facility] Mobility Specialist  M.S. Primary Phone: [phone] M.S. Secondary Phone: [phone]

## 2021-05-09 NOTE — Progress Notes (Signed)
Patient ID: Paul [name], male   DOB: 04-13-1991, 30 y.o.   MRN: [id number] Patient is postoperative day 1 first ray amputation left foot.  Patient had an extensive abscess that tracked up the FHL tendon.  There was a significant amount of necrotic tissue that was excised.  Tissue margins were grossly viable however cultures are positive for gram-positive cocci.  Would recommend discharging on oral antibiotics pending culture sensitivities.

## 2021-05-09 NOTE — Progress Notes (Signed)
Mobility Specialist Progress Note    05/09/21 1225  Mobility  Activity Transferred:  Bed to chair  Level of Assistance Contact guard assist, steadying assist  Assistive Device Front wheel walker  Distance Ambulated (ft) 5 ft  Mobility Out of bed to chair with meals  Mobility Response Tolerated well  Mobility performed by Mobility specialist  Bed Position Chair  $Mobility charge 1 Mobility   Pt received in bed and agreeable. No complaints. Will f/u this pm for hallway ambulation.  [facility] Mobility Specialist  M.S. Primary Phone: [phone] M.S. Secondary Phone: [phone]

## 2021-05-09 NOTE — Progress Notes (Signed)
PROGRESS NOTE    Paul [name]  WNI:[id number] DOB: July 13, 1990 DOA: 05/06/2021 PCP: Orvis Brill, DO   Chief Complaint  Patient presents with   Wound Infection   Brief Narrative/Hospital Course: Paul [name], 30 y.o. male with PMH of ESRD and HD TTS, T2DM, morbid obesity bmi 43, HTN, HLD, anemia of chronic disease, hypothyroidism presented with wound on the left great toe along with redness, pain initially started as a blister and ulcer on the bottom of the left great toe few weeks ago and got worse in last 2 to 3 days. He was seen in the ED systolic blood pressure insulin 80s given IV fluid boluses CT scan of the fluid no periosteal reaction MRI recommended, also had leukocytosis lactic acidosis abnormal LFTs and concern about severe sepsis PCCM was consulted and after discussion transferred under hospitalist for admission for severe sepsis. 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 Continue Ancef, oral amoxicillin, and further plan for bacteremia as per ID Recent Labs  Lab 05/06/21 1600 05/06/21 1740 05/07/21 0312 05/07/21 0610 05/08/21 0621 05/09/21 0232  WBC 19.5*  --  18.2*  --  19.7* 12.6*  LATICACIDVEN 2.3* 2.0* 1.0 1.2  --   --      T2DM on insulin with CKD and diabetic neuropathy sugar fairly controlled on SSI, Lantus. Stable HbA1c 6 , added  Neurontin. Recent Labs  Lab 05/08/21 0840 05/08/21 1748 05/08/21 1846 05/08/21 2137 05/09/21 0843  GLUCAP 118* 129* 124* 240* 221*    [facility]:WEXHBZJ BP meds due to sepsis.  Patient-placed on midodrine IRC:VELF statin ESRD and HD TTS: Nephrology consulted for dialysis, s/p HD 12/17  Anemia of chronic disease: stabe, monitor hemoglobin Recent Labs  Lab 05/06/21 1600 05/07/21 0312 05/08/21 0621 05/09/21 0232  HGB 14.3 11.9* 11.9* 11.4*  HCT 43.0 36.8* 81.0* 17.5*  Metabolic bone disease: Monitor calcium and Phos.Continue Sensipar Hypokalemia-stable. Recent Labs  Lab 05/06/21 1600 05/07/21 0312 05/08/21 0621  K 3.8 3.2* 3.7  Hypothyroidism: cont Synthroid Class III Obesity:Patient's Body mass index is 42.09 kg/m. : Will benefit with PCP follow-up, weight loss  healthy lifestyle and outpatient sleep evaluation.  DVT prophylaxis: heparin injection 5,000 Units Start: 05/07/21 1400 Code Status:   Code Status: Full Code Family Communication: plan of care discussed with patient at bedside. Status is: Inpatient Remains inpatient appropriate because: For IV antibiotics and management of osteomyelitis Disposition: Currently not medically stable for discharge. Anticipated Disposition:TBD  Objective: Vitals last 24 hrs: Vitals:   05/08/21 1936 05/08/21 2120 05/08/21 2246 05/09/21 0840  BP: (!) 109/46 (!) 105/55 (!) 104/57 (!) 121/54  Pulse: (!) 112 (!) 101 (!) 105 (!) 102  Resp: 18 18 16 18   Temp: 99.1 F (37.3 C) 99.2 F (37.3 C) 99.1 F (37.3 C) 99.8 F (37.7 C)  TempSrc: Oral Oral Oral Oral  SpO2: 97% 96% 99% 100%  Weight:      Height:  Weight change:   Intake/Output Summary (Last 24 hours) at 05/09/2021 7858 Last data filed at 05/09/2021 0454 Gross per 24 hour  Intake 100 ml  Output 2000 ml  Net -1900 ml   Net IO Since Admission: 372.94 mL [05/09/21 0927]   Physical Examination: General exam: AAOx 3, obese HEENT:Oral mucosa moist, Ear/Nose WNL grossly,  dentition normal. Respiratory system: bilaterally clear, no use of accessory muscle Cardiovascular system: S1 & S2 +, No JVD,. Gastrointestinal system: Abdomen soft, NT,ND, BS+ Nervous System:Alert, awake, moving extremities and grossly nonfocal Extremities: Left foot with dressing in place Skin: No rashes,no icterus. MSK: Normal muscle bulk,tone, power  TDC+  Medications reviewed:  Scheduled Meds:  amoxicillin  500 mg Oral Q24H   Chlorhexidine Gluconate Cloth  6 each Topical Q0600   cinacalcet  60 mg Oral QODAY   gabapentin  100 mg Oral TID   heparin  5,000 Units Subcutaneous Q8H   insulin aspart  0-6 Units Subcutaneous TID WC & HS   insulin glargine-yfgn  5 Units Subcutaneous Daily   levothyroxine  75 mcg Oral QAC breakfast   melatonin  3 mg Oral QHS   midodrine  5 mg Oral TID WC   multivitamin  1 tablet Oral QPM   rosuvastatin  20 mg Oral Daily   sodium chloride flush  3 mL Intravenous Q12H   Continuous Infusions:  sodium chloride      ceFAZolin (ANCEF) IV 2 g (05/08/21 1856)    Diet Order             Diet renal/carb modified with fluid restriction Diet-HS Snack? Nothing; Fluid restriction: 1200 mL Fluid; Room service appropriate? Yes; Fluid consistency: Thin  Diet effective now                          Weight change:   Wt Readings from Last 3 Encounters:  05/08/21 (!) 144.7 kg  03/26/21 (!) 149.7 kg  03/17/21 (!) 151 kg     Consultants:see note  Procedures:see note Antimicrobials: Anti-infectives (From admission, onward)    Start     Dose/Rate Route Frequency Ordered Stop   05/08/21 1800  ceFAZolin (ANCEF) IVPB 2g/100 mL premix        2 g 200 mL/hr over 30 Minutes Intravenous Every T-Th-Sa (1800) 05/07/21 1239     05/08/21 1600  amoxicillin (AMOXIL) capsule 500 mg        500 mg Oral Every 24 hours 05/08/21 1535     05/08/21 1200  vancomycin (VANCOCIN) IVPB 1000 mg/200 mL premix  Status:  Discontinued        1,000 mg 200 mL/hr over 60 Minutes  Intravenous Every T-Th-Sa (Hemodialysis) 05/07/21 0233 05/07/21 1239   05/08/21 0600  ceFAZolin (ANCEF) IVPB 3g/100 mL premix        3 g 200 mL/hr over 30 Minutes Intravenous On call to O.R. 05/07/21 1857 05/08/21 0755   05/07/21 1530  ceFAZolin (ANCEF) IVPB 2g/100 mL premix  Status:  Discontinued        2 g 200 mL/hr over 30 Minutes Intravenous To Radiology 05/07/21 1436 05/07/21 1615   05/06/21 1700  vancomycin (VANCOCIN) IVPB 1000 mg/200 mL premix        1,000 mg 200 mL/hr over 60 Minutes Intravenous Every 1 hr x 2 05/06/21 1621 05/06/21 2005   05/06/21 1630  ceFEPIme (MAXIPIME) 2 g in sodium chloride 0.9 % 100 mL IVPB        2 g  200 mL/hr over 30 Minutes Intravenous  Once 05/06/21 1621 05/06/21 1701   05/06/21 1630  metroNIDAZOLE (FLAGYL) IVPB 500 mg        500 mg 100 mL/hr over 60 Minutes Intravenous  Once 05/06/21 1621 05/06/21 1728   05/06/21 1615  piperacillin-tazobactam (ZOSYN) IVPB 2.25 g  Status:  Discontinued        2.25 g 100 mL/hr over 30 Minutes Intravenous  Once 05/06/21 1614 05/06/21 1621      Culture/Microbiology    Component Value Date/Time   SDES TISSUE 05/08/2021 0810   SPECREQUEST TIS FROM GREAT LEFT TOE SPEC A 05/08/2021 0810   CULT PENDING 05/08/2021 0810   REPTSTATUS PENDING 05/08/2021 0810    Other culture-see note  Unresulted Labs (From admission, onward)     Start     Ordered   05/08/21 0500  CBC  Daily,   R     Question:  Specimen collection method  Answer:  Lab=Lab collect   05/07/21 0745   05/08/21 0500  Culture, blood (routine x 2)  BLOOD CULTURE X 2,   R      05/07/21 1401   05/07/21 1437  Hepatitis B surface antibody,quantitative  (New Admission Hemo Labs (Hepatitis B))  Once,   R       Question:  Specimen collection method  Answer:  Lab=Lab collect   05/07/21 1436   05/06/21 1602  Urinalysis, Routine w reflex microscopic  (Septic presentation on arrival (screening labs, nursing and treatment orders for obvious sepsis))  ONCE - STAT,   STAT         05/06/21 1605   Signed and Held  CBC  Daily,   R     Question:  Specimen collection method  Answer:  Lab=Lab collect   Signed and Held          Data Reviewed: I have personally reviewed following labs and imaging studies CBC: Recent Labs  Lab 05/06/21 1600 05/07/21 0312 05/08/21 0621 05/09/21 0232  WBC 19.5* 18.2* 19.7* 12.6*  NEUTROABS 15.0*  --   --   --   HGB 14.3 11.9* 11.9* 11.4*  HCT 43.0 36.8* 35.9* 35.9*  MCV 84.0 84.0 83.7 84.3  PLT 226 202 237 270   Basic Metabolic Panel: Recent Labs  Lab 05/06/21 1600 05/07/21 0312 05/08/21 0621  NA 134* 132* 134*  K 3.8 3.2* 3.7  CL 92* 94* 93*  CO2 25 23 22   GLUCOSE 163* 158* 109*  BUN 30* 32* 47*  CREATININE 11.66* 13.15* 16.36*  CALCIUM 9.7 9.2 8.8*   GFR: Estimated Creatinine Clearance: 10.1 mL/min (A) (by C-G formula based on SCr of 16.36 mg/dL (H)). Liver Function Tests: Recent Labs  Lab 05/06/21 1600  AST 21  ALT 12  ALKPHOS 69  BILITOT 0.7  PROT 9.8*  ALBUMIN 4.3   No results for input(s): LIPASE, AMYLASE in the last 168 hours. No results for input(s): AMMONIA in the last 168 hours. Coagulation Profile: Recent Labs  Lab 05/06/21 1600  INR 1.3*   Cardiac Enzymes: No results for input(s): CKTOTAL, CKMB, CKMBINDEX, TROPONINI in the last 168 hours. BNP (last 3 results) No results for input(s): PROBNP in the last 8760 hours. HbA1C: Recent Labs    05/07/21 0312  HGBA1C 6.0*   CBG: Recent Labs  Lab 05/08/21 0840 05/08/21 1748 05/08/21 1846 05/08/21 2137 05/09/21 0843  GLUCAP 118* 129* 124* 240* 221*   Lipid Profile: No results for input(s): CHOL, HDL, LDLCALC, TRIG, CHOLHDL, LDLDIRECT in  the last 72 hours. Thyroid Function Tests: No results for input(s): TSH, T4TOTAL, FREET4, T3FREE, THYROIDAB in the last 72 hours. Anemia Panel: No results for input(s): VITAMINB12, FOLATE, FERRITIN, TIBC, IRON, RETICCTPCT in the last 72 hours. Sepsis Labs: Recent Labs  Lab 05/06/21 1600  05/06/21 1740 05/07/21 0312 05/07/21 0610  LATICACIDVEN 2.3* 2.0* 1.0 1.2    Recent Results (from the past 240 hour(s))  Resp Panel by RT-PCR (Flu A&B, Covid) Nasopharyngeal Swab     Status: None   Collection Time: 05/06/21  3:37 PM   Specimen: Nasopharyngeal Swab; Nasopharyngeal(NP) swabs in vial transport medium  Result Value Ref Range Status   SARS Coronavirus 2 by RT PCR NEGATIVE NEGATIVE Final    Comment: (NOTE) SARS-CoV-2 target nucleic acids are NOT DETECTED.  The SARS-CoV-2 RNA is generally detectable in upper respiratory specimens during the acute phase of infection. The lowest concentration of SARS-CoV-2 viral copies this assay can detect is 138 copies/mL. A negative result does not preclude SARS-Cov-2 infection and should not be used as the sole basis for treatment or other patient management decisions. A negative result may occur with  improper specimen collection/handling, submission of specimen other than nasopharyngeal swab, presence of viral mutation(s) within the areas targeted by this assay, and inadequate number of viral copies(<138 copies/mL). A negative result must be combined with clinical observations, patient history, and epidemiological information. The expected result is Negative.  Fact Sheet for Patients:  EntrepreneurPulse.com.au  Fact Sheet for Healthcare Providers:  IncredibleEmployment.be  This test is no t yet approved or cleared by the [place] FDA and  has been authorized for detection and/or diagnosis of SARS-CoV-2 by FDA under an Emergency Use Authorization (EUA). This EUA will remain  in effect (meaning this test can be used) for the duration of the COVID-19 declaration under Section 564(b)(1) of the Act, 21 U.S.C.section 360bbb-3(b)(1), unless the authorization is terminated  or revoked sooner.       Influenza A by PCR NEGATIVE NEGATIVE Final   Influenza B by PCR NEGATIVE NEGATIVE Final     Comment: (NOTE) The Xpert Xpress SARS-CoV-2/FLU/RSV plus assay is intended as an aid in the diagnosis of influenza from Nasopharyngeal swab specimens and should not be used as a sole basis for treatment. Nasal washings and aspirates are unacceptable for Xpert Xpress SARS-CoV-2/FLU/RSV testing.  Fact Sheet for Patients: EntrepreneurPulse.com.au  Fact Sheet for Healthcare Providers: IncredibleEmployment.be  This test is not yet approved or cleared by the [place] FDA and has been authorized for detection and/or diagnosis of SARS-CoV-2 by FDA under an Emergency Use Authorization (EUA). This EUA will remain in effect (meaning this test can be used) for the duration of the COVID-19 declaration under Section 564(b)(1) of the Act, 21 U.S.C. section 360bbb-3(b)(1), unless the authorization is terminated or revoked.  Performed at [facility], [address] [place], [place] [address]   Culture, blood (x 2)     Status: None (Preliminary result)   Collection Time: 05/06/21  4:00 PM   Specimen: BLOOD  Result Value Ref Range Status   Specimen Description   Final    BLOOD BLOOD RIGHT HAND Performed at [facility], [address] [place], [place] [address]    Special Requests   Final    BOTTLES DRAWN AEROBIC ONLY Blood Culture results may not be optimal due to an inadequate volume of blood received in culture bottles Performed at [facility], [address] [place],  [place] [address]    Culture  Setup Time   Final    GRAM POSITIVE COCCI AEROBIC BOTTLE ONLY CRITICAL RESULT CALLED TO, READ BACK BY AND VERIFIED WITH: PHARMD GREG ABBOTT 05/08/21@4 :17 BY TW    Culture   Final    NO GROWTH 2 DAYS Performed at [facility], [place] [address], [place], [place] [address]    Report Status PENDING  Incomplete  Blood Culture ID Panel (Reflexed)     Status: Abnormal   Collection Time: 05/06/21  4:00 PM  Result  Value Ref Range Status   Enterococcus faecalis NOT DETECTED NOT DETECTED Final   Enterococcus Faecium NOT DETECTED NOT DETECTED Final   Listeria monocytogenes NOT DETECTED NOT DETECTED Final   Staphylococcus species DETECTED (A) NOT DETECTED Final    Comment: CRITICAL RESULT CALLED TO, READ BACK BY AND VERIFIED WITH: PHARMD GREG ABBOTT 05/08/21@4 :17 BY TW    Staphylococcus aureus (BCID) DETECTED (A) NOT DETECTED Final    Comment: CRITICAL RESULT CALLED TO, READ BACK BY AND VERIFIED WITH: PHARMD GREG ABBOTT 05/08/21@4 :17 BY TW    Staphylococcus epidermidis NOT DETECTED NOT DETECTED Final   Staphylococcus lugdunensis NOT DETECTED NOT DETECTED Final   Streptococcus species NOT DETECTED NOT DETECTED Final   Streptococcus agalactiae NOT DETECTED NOT DETECTED Final   Streptococcus pneumoniae NOT DETECTED NOT DETECTED Final   Streptococcus pyogenes NOT DETECTED NOT DETECTED Final   A.calcoaceticus-baumannii NOT DETECTED NOT DETECTED Final   Bacteroides fragilis NOT DETECTED NOT DETECTED Final   Enterobacterales NOT DETECTED NOT DETECTED Final   Enterobacter cloacae complex NOT DETECTED NOT DETECTED Final   Escherichia coli NOT DETECTED NOT DETECTED Final   Klebsiella aerogenes NOT DETECTED NOT DETECTED Final   Klebsiella oxytoca NOT DETECTED NOT DETECTED Final   Klebsiella pneumoniae NOT DETECTED NOT DETECTED Final   Proteus species NOT DETECTED NOT DETECTED Final   Salmonella species NOT DETECTED NOT DETECTED Final   Serratia marcescens NOT DETECTED NOT DETECTED Final   Haemophilus influenzae NOT DETECTED NOT DETECTED Final   Neisseria meningitidis NOT DETECTED NOT DETECTED Final   Pseudomonas aeruginosa NOT DETECTED NOT DETECTED Final   Stenotrophomonas maltophilia NOT DETECTED NOT DETECTED Final   Candida albicans NOT DETECTED NOT DETECTED Final   Candida auris NOT DETECTED NOT DETECTED Final   Candida glabrata NOT DETECTED NOT DETECTED Final   Candida krusei NOT DETECTED NOT DETECTED  Final   Candida parapsilosis NOT DETECTED NOT DETECTED Final   Candida tropicalis NOT DETECTED NOT DETECTED Final   Cryptococcus neoformans/gattii NOT DETECTED NOT DETECTED Final   Meth resistant mecA/C and MREJ NOT DETECTED NOT DETECTED Final    Comment: Performed at [facility], [address], [place], [place] [address]  Culture, blood (x 2)     Status: Abnormal   Collection Time: 05/06/21  4:15 PM   Specimen: BLOOD  Result Value Ref Range Status   Specimen Description   Final    BLOOD BLOOD RIGHT HAND Performed at [facility], [address] [place], [place] [address]    Special Requests   Final    BOTTLES DRAWN AEROBIC AND ANAEROBIC Blood Culture adequate volume Performed at [facility], [address] [place], [place] [address]    Culture  Setup Time   Final    GRAM POSITIVE COCCI IN CLUSTERS IN BOTH AEROBIC AND ANAEROBIC BOTTLES CRITICAL RESULT CALLED TO, READ BACK BY AND VERIFIED WITH: PHARMD J.FRENS AT 2353 ON 05/07/2021 BY  T.SAAD. Performed at [facility], [place] [address] [place], [place]  [address]    Culture STAPHYLOCOCCUS AUREUS (A)  Final   Report Status 05/09/2021 FINAL  Final   Organism ID, Bacteria STAPHYLOCOCCUS AUREUS  Final      Susceptibility   Staphylococcus aureus - MIC*    CIPROFLOXACIN >=8 RESISTANT Resistant     ERYTHROMYCIN <=0.25 SENSITIVE Sensitive     GENTAMICIN <=0.5 SENSITIVE Sensitive     OXACILLIN <=0.25 SENSITIVE Sensitive     TETRACYCLINE <=1 SENSITIVE Sensitive     VANCOMYCIN 1 SENSITIVE Sensitive     TRIMETH/SULFA <=10 SENSITIVE Sensitive     CLINDAMYCIN <=0.25 SENSITIVE Sensitive     RIFAMPIN <=0.5 SENSITIVE Sensitive     Inducible Clindamycin NEGATIVE Sensitive     * STAPHYLOCOCCUS AUREUS  Blood Culture ID Panel (Reflexed)     Status: Abnormal   Collection Time: 05/06/21  4:15 PM  Result Value Ref Range Status   Enterococcus faecalis NOT DETECTED NOT DETECTED Final   Enterococcus Faecium NOT  DETECTED NOT DETECTED Final   Listeria monocytogenes NOT DETECTED NOT DETECTED Final   Staphylococcus species DETECTED (A) NOT DETECTED Final    Comment: CRITICAL RESULT CALLED TO, READ BACK BY AND VERIFIED WITH: PHARMD J.FRENS AT 3710 ON 05/07/2021 BY T.SAAD.    Staphylococcus aureus (BCID) DETECTED (A) NOT DETECTED Final    Comment: CRITICAL RESULT CALLED TO, READ BACK BY AND VERIFIED WITH: PHARMD J.FRENS AT 6269 ON 05/07/2021 BY T.SAAD.    Staphylococcus epidermidis NOT DETECTED NOT DETECTED Final   Staphylococcus lugdunensis NOT DETECTED NOT DETECTED Final   Streptococcus species NOT DETECTED NOT DETECTED Final   Streptococcus agalactiae NOT DETECTED NOT DETECTED Final   Streptococcus pneumoniae NOT DETECTED NOT DETECTED Final   Streptococcus pyogenes NOT DETECTED NOT DETECTED Final   A.calcoaceticus-baumannii NOT DETECTED NOT DETECTED Final   Bacteroides fragilis NOT DETECTED NOT DETECTED Final   Enterobacterales NOT DETECTED NOT DETECTED Final   Enterobacter cloacae complex NOT DETECTED NOT DETECTED Final   Escherichia coli NOT DETECTED NOT DETECTED Final   Klebsiella aerogenes NOT DETECTED NOT DETECTED Final   Klebsiella oxytoca NOT DETECTED NOT DETECTED Final   Klebsiella pneumoniae NOT DETECTED NOT DETECTED Final   Proteus species NOT DETECTED NOT DETECTED Final   Salmonella species NOT DETECTED NOT DETECTED Final   Serratia marcescens NOT DETECTED NOT DETECTED Final   Haemophilus influenzae NOT DETECTED NOT DETECTED Final   Neisseria meningitidis NOT DETECTED NOT DETECTED Final   Pseudomonas aeruginosa NOT DETECTED NOT DETECTED Final   Stenotrophomonas maltophilia NOT DETECTED NOT DETECTED Final   Candida albicans NOT DETECTED NOT DETECTED Final   Candida auris NOT DETECTED NOT DETECTED Final   Candida glabrata NOT DETECTED NOT DETECTED Final   Candida krusei NOT DETECTED NOT DETECTED Final   Candida parapsilosis NOT DETECTED NOT DETECTED Final   Candida tropicalis NOT  DETECTED NOT DETECTED Final   Cryptococcus neoformans/gattii NOT DETECTED NOT DETECTED Final   Meth resistant mecA/C and MREJ NOT DETECTED NOT DETECTED Final    Comment: Performed at [facility], [address] [place], [place] [address]  Aerobic/Anaerobic Culture w Gram Stain (surgical/deep wound)     Status: None (Preliminary result)   Collection Time: 05/06/21  7:47 PM   Specimen: Toe; Wound  Result Value Ref Range Status   Specimen Description   Final    TOE Performed at [facility], [address] [place], [place] [address]  Special Requests   Final    Immunocompromised Performed at [facility], [name] [place], [place] [address]    Gram Stain   Final    NO WBC SEEN MODERATE GRAM POSITIVE COCCI IN PAIRS    Culture   Final    ABUNDANT STAPHYLOCOCCUS AUREUS MODERATE ENTEROCOCCUS FAECALIS CULTURE REINCUBATED FOR BETTER GROWTH SUSCEPTIBILITIES TO FOLLOW Performed at [facility], [place] [address] [place], [place] [address]    Report Status PENDING  Incomplete  MRSA Next Gen by PCR, Nasal     Status: None   Collection Time: 05/08/21  2:17 AM   Specimen: Nasal Mucosa; Nasal Swab  Result Value Ref Range Status   MRSA by PCR Next Gen NOT DETECTED NOT DETECTED Final    Comment: (NOTE) The GeneXpert MRSA Assay (FDA approved for NASAL specimens only), is one component of a comprehensive MRSA colonization surveillance program. It is not intended to diagnose MRSA infection nor to guide or monitor treatment for MRSA infections. Test performance is not FDA approved in patients less than 56 years old. Performed at [facility], [place] [address], [place], [place] [address]   Aerobic/Anaerobic Culture w Gram Stain (surgical/deep wound)     Status: None (Preliminary result)   Collection Time: 05/08/21  8:10 AM   Specimen: PATH Other; Tissue  Result Value Ref Range Status   Specimen Description TISSUE  Final   Special Requests TIS  FROM GREAT LEFT TOE SPEC A  Final   Gram Stain   Final    FEW WBC PRESENT, PREDOMINANTLY PMN FEW GRAM POSITIVE COCCI Performed at [facility], [place] [address], [place], [place] [address]    Culture PENDING  Incomplete   Report Status PENDING  Incomplete     Radiology Studies: VAS [place] ABI WITH/WO TBI  Result Date: 05/08/2021  LOWER EXTREMITY DOPPLER STUDY Patient Name:  TRINTEN BOUDOIN  Date of Exam:   05/07/2021 Medical Rec #: [id number]      Accession #:    [id number] Date of Birth: 1990/10/29       Patient Gender: M Patient Age:   35 years Exam Location:  [facility] Procedure:      VAS [place] ABI WITH/WO TBI Referring Phys: Yahir DUDA --------------------------------------------------------------------------------  Indications: Left great toe ulceration. High Risk Factors: Hyperlipidemia, Diabetes. Other Factors: ESRD on dialysis.  Limitations: Today's exam was limited due to Bandaging, history of multiple UE              dialysis accesses bilaterally. Comparison Study: No prior studies. Performing Technologist: Darlin Coco RDMS RVT  Examination Guidelines: A complete evaluation includes at minimum, Doppler waveform signals and systolic blood pressure reading at the level of bilateral brachial, anterior tibial, and posterior tibial arteries, when vessel segments are accessible. Bilateral testing is considered an integral part of a complete examination. Photoelectric Plethysmograph (PPG) waveforms and toe systolic pressure readings are included as required and additional duplex testing as needed. Limited examinations for reoccurring indications may be performed as noted.  ABI Findings: +---------+------------------+-----+---------+---------------------------------+  Right     Rt Pressure (mmHg) Index Waveform  Comment                            +---------+------------------+-----+---------+---------------------------------+  Brachial                           triphasic Brachial waveform  only,  unable to                                                obtain pressure due to hx of                                                     dialysis access                    +---------+------------------+-----+---------+---------------------------------+  PTA       128                1.28  triphasic                                    +---------+------------------+-----+---------+---------------------------------+  DP        102                1.02  triphasic                                    +---------+------------------+-----+---------+---------------------------------+  Great Toe 70                 0.70  Normal                                       +---------+------------------+-----+---------+---------------------------------+ +---------+------------------+-----+---------+---------------------------------+  Left      Lt Pressure (mmHg) Index Waveform  Comment                            +---------+------------------+-----+---------+---------------------------------+  Brachial  100                      triphasic Brachial waveform, radial/forearm                                                pressure due to hx of dialysis                                                   access                             +---------+------------------+-----+---------+---------------------------------+  PTA       126                1.26  triphasic                                    +---------+------------------+-----+---------+---------------------------------+  DP        114  1.14  triphasic                                    +---------+------------------+-----+---------+---------------------------------+  Great Toe                                    Unable to obtain due to                                                          wound/bandaging                    +---------+------------------+-----+---------+---------------------------------+  Summary: Right: Resting right ankle-brachial index is within normal range.  No evidence of significant right lower extremity arterial disease. The right toe-brachial index is normal. Left: Resting left ankle-brachial index is within normal range. No evidence of significant left lower extremity arterial disease.  *See table(s) above for measurements and observations.  Electronically signed by Orlie Pollen on 05/08/2021 at 10:25:27 AM.    Final      LOS: 2 days   Antonieta Pert, MD [facility]  05/09/2021, 9:27 AM

## 2021-05-09 NOTE — Progress Notes (Signed)
Pt refused progressive monitoring MD notified Sheran Luz  by [facility]

## 2021-05-09 NOTE — Plan of Care (Signed)
  Problem: Activity: Goal: Risk for activity intolerance will decrease Outcome: Progressing   Problem: Nutrition: Goal: Adequate nutrition will be maintained Outcome: Progressing   Problem: Coping: Goal: Level of anxiety will decrease Outcome: Progressing   Problem: Safety: Goal: Ability to remain free from injury will improve Outcome: Progressing   

## 2021-05-09 NOTE — Progress Notes (Signed)
[facility] Progress Note   Subjective:  Left first ray amp with Dr. Sharol Given yesterday  Dialysis yesterday net UF 2 L - no issues. Feels better today. No cp, dyspnea   Objective Vitals:   05/08/21 1936 05/08/21 2120 05/08/21 2246 05/09/21 0840  BP: (!) 109/46 (!) 105/55 (!) 104/57 (!) 121/54  Pulse: (!) 112 (!) 101 (!) 105 (!) 102  Resp: 18 18 16 18   Temp: 99.1 F (37.3 C) 99.2 F (37.3 C) 99.1 F (37.3 C) 99.8 F (37.7 C)  TempSrc: Oral Oral Oral Oral  SpO2: 97% 96% 99% 100%  Weight:      Height:         Additional Objective Labs: Basic Metabolic Panel: Recent Labs  Lab 05/06/21 1600 05/07/21 0312 05/08/21 0621  NA 134* 132* 134*  K 3.8 3.2* 3.7  CL 92* 94* 93*  CO2 25 23 22   GLUCOSE 163* 158* 109*  BUN 30* 32* 47*  CREATININE 11.66* 13.15* 16.36*  CALCIUM 9.7 9.2 8.8*   CBC: Recent Labs  Lab 05/06/21 1600 05/07/21 0312 05/08/21 0621 05/09/21 0232  WBC 19.5* 18.2* 19.7* 12.6*  NEUTROABS 15.0*  --   --   --   HGB 14.3 11.9* 11.9* 11.4*  HCT 43.0 36.8* 35.9* 35.9*  MCV 84.0 84.0 83.7 84.3  PLT 226 202 237 230   Blood Culture    Component Value Date/Time   SDES TISSUE 05/08/2021 0810   SPECREQUEST TIS FROM GREAT LEFT TOE SPEC A 05/08/2021 0810   CULT PENDING 05/08/2021 0810   REPTSTATUS PENDING 05/08/2021 0810     Physical Exam General: Well appearing, nad  Heart: RRR No m,r,g  Lungs: Clear bilaterally  Abdomen: obese, soft -non tender  Extremities: L toe amp, bandaged. Trace LE edema  Dialysis Access: R IJ TDC   Medications:  sodium chloride      ceFAZolin (ANCEF) IV 2 g (05/08/21 1856)    amoxicillin  500 mg Oral Q24H   Chlorhexidine Gluconate Cloth  6 each Topical Q0600   cinacalcet  60 mg Oral QODAY   gabapentin  100 mg Oral TID   heparin  5,000 Units Subcutaneous Q8H   insulin aspart  0-6 Units Subcutaneous TID WC & HS   insulin glargine-yfgn  5 Units Subcutaneous Daily   levothyroxine  75 mcg Oral QAC breakfast    melatonin  3 mg Oral QHS   midodrine  5 mg Oral TID WC   multivitamin  1 tablet Oral QPM   rosuvastatin  20 mg Oral Daily   sodium chloride flush  3 mL Intravenous Q12H    Dialysis Orders:  [facility]   4.5h   500/1.5   145kg  2/2 bath P2 TDC  Hep 8000  - hect 5ug tiw  - no esa  Assessment/Plan: Sepsis/ L foot osteo - w/ ^WBC, hypotension. 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  MSSA bacteremia 2/2 L foot osteo -  Antibiotics/recs per ID ESRD - on HD TTS. Continue on schedule. Next HD 12/20 Plans to go to sister's house in [place] after discharge --will need transient placement at [facility]. Contact renal navigator for assistance on Monday.  HD access - on HD 3 yrs. 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 Supposed to see [facility] soon about next access. Using [facility] now.  HTN - BP's soft likely due to infection. Better today.  DM2 - on insulin.  MBD ckd -  Ca ok, add on phos. Cont sensipar and [place].  Anemia ckd - Hb >10, no esa at OP unit. Follow.   Lynnda Child PA-C [facility] 05/09/2021,12:35 PM

## 2021-05-10 ENCOUNTER — Encounter (HOSPITAL_COMMUNITY): Payer: Self-pay | Admitting: Orthopedic Surgery

## 2021-05-10 DIAGNOSIS — Z794 Long term (current) use of insulin: Secondary | ICD-10-CM | POA: Diagnosis not present

## 2021-05-10 DIAGNOSIS — I120 Hypertensive chronic kidney disease with stage 5 chronic kidney disease or end stage renal disease: Secondary | ICD-10-CM | POA: Diagnosis not present

## 2021-05-10 DIAGNOSIS — N186 End stage renal disease: Secondary | ICD-10-CM | POA: Diagnosis not present

## 2021-05-10 DIAGNOSIS — R7881 Bacteremia: Secondary | ICD-10-CM | POA: Diagnosis not present

## 2021-05-10 DIAGNOSIS — E1129 Type 2 diabetes mellitus with other diabetic kidney complication: Secondary | ICD-10-CM | POA: Diagnosis not present

## 2021-05-10 DIAGNOSIS — N250 Renal osteodystrophy: Secondary | ICD-10-CM | POA: Diagnosis not present

## 2021-05-10 DIAGNOSIS — Z992 Dependence on renal dialysis: Secondary | ICD-10-CM | POA: Diagnosis not present

## 2021-05-10 DIAGNOSIS — B9561 Methicillin susceptible Staphylococcus aureus infection as the cause of diseases classified elsewhere: Secondary | ICD-10-CM | POA: Diagnosis not present

## 2021-05-10 DIAGNOSIS — A419 Sepsis, unspecified organism: Secondary | ICD-10-CM | POA: Diagnosis not present

## 2021-05-10 LAB — CBC
HCT: 37 % — ABNORMAL LOW (ref 39.0–52.0)
Hemoglobin: 12 g/dL — ABNORMAL LOW (ref 13.0–17.0)
MCH: 27.3 pg (ref 26.0–34.0)
MCHC: 32.4 g/dL (ref 30.0–36.0)
MCV: 84.3 fL (ref 80.0–100.0)
Platelets: 299 10*3/uL (ref 150–400)
RBC: 4.39 MIL/uL (ref 4.22–5.81)
RDW: 17.2 % — ABNORMAL HIGH (ref 11.5–15.5)
WBC: 9.7 10*3/uL (ref 4.0–10.5)
nRBC: 0 % (ref 0.0–0.2)

## 2021-05-10 LAB — AEROBIC/ANAEROBIC CULTURE W GRAM STAIN (SURGICAL/DEEP WOUND): Gram Stain: NONE SEEN

## 2021-05-10 LAB — GLUCOSE, CAPILLARY
Glucose-Capillary: 139 mg/dL — ABNORMAL HIGH (ref 70–99)
Glucose-Capillary: 260 mg/dL — ABNORMAL HIGH (ref 70–99)
Glucose-Capillary: 123 mg/dL — ABNORMAL HIGH (ref 70–99)
Glucose-Capillary: 194 mg/dL — ABNORMAL HIGH (ref 70–99)
Glucose-Capillary: 126 mg/dL — ABNORMAL HIGH (ref 70–99)

## 2021-05-10 MED ORDER — FERRIC CITRATE 1 GM 210 MG(FE) PO TABS
210.0000 mg | ORAL_TABLET | ORAL | Status: DC | PRN
  Filled 2021-05-10: qty 1

## 2021-05-10 MED ORDER — AMOXICILLIN-POT CLAVULANATE 500-125 MG PO TABS
1.0000 | ORAL_TABLET | Freq: Every day | ORAL | Status: DC
  Administered 2021-05-10 – 2021-05-13 (×4): 500 mg via ORAL
  Filled 2021-05-10 (×6): qty 1

## 2021-05-10 NOTE — Anesthesia Postprocedure Evaluation (Signed)
Anesthesia Post Note  Patient: Paul [name]  Procedure(s) Performed: AMPUTATION GREAT TOE (Left)     Patient location during evaluation: PACU Anesthesia Type: General Level of consciousness: awake and alert Pain management: pain level controlled Vital Signs Assessment: post-procedure vital signs reviewed and stable Respiratory status: spontaneous breathing, nonlabored ventilation, respiratory function stable and patient connected to nasal cannula oxygen Cardiovascular status: blood pressure returned to baseline and stable Postop Assessment: no apparent nausea or vomiting Anesthetic complications: no   No notable events documented.  Last Vitals:  Vitals:   05/10/21 0000 05/10/21 0514  BP: 116/74 (!) 109/97  Pulse: 92 87  Resp: 18 18  Temp: 36.7 C 36.6 C  SpO2: 98% 98%    Last Pain:  Vitals:   05/10/21 0623  TempSrc:   PainSc: [place]

## 2021-05-10 NOTE — Progress Notes (Addendum)
[facility] for Infectious Disease    Date of Admission:  05/06/2021   Total days of antibiotics 5/cefazolin (day 3 since clearance)          ID: Paul [name] is a 30 y.o. 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 Concern about pain associated with changing out his temp catheter  Medications:   amoxicillin  500 mg Oral Q24H   Chlorhexidine Gluconate Cloth  6 each Topical Q0600   cinacalcet  60 mg Oral QODAY   ferric citrate  630 mg Oral TID WC   gabapentin  100 mg Oral TID   heparin  5,000 Units Subcutaneous Q8H   insulin aspart  0-6 Units Subcutaneous TID WC & HS   insulin glargine-yfgn  5 Units Subcutaneous Daily   levothyroxine  75 mcg Oral QAC breakfast   melatonin  3 mg Oral QHS   midodrine  5 mg Oral TID WC   multivitamin  1 tablet Oral QPM   rosuvastatin  20 mg Oral Daily   sodium chloride flush  3 mL Intravenous Q12H    Objective: Vital signs in last 24 hours: Temp:  [97.8 F (36.6 C)-98.7 F (37.1 C)] 98.2 F (36.8 C) (12/19 1132) Pulse Rate:  [87-95] 92 (12/19 1132) Resp:  [16-18] 16 (12/19 1132) BP: (95-164)/(54-97) 134/91 (12/19 1132) SpO2:  [98 %-99 %] 99 % (12/19 1132) Physical Exam  Constitutional: He is oriented to person, place, and time. He appears well-developed and well-nourished. No distress.  HENT:  Mouth/Throat: Oropharynx is clear and moist. No oropharyngeal exudate.  Chest wall: left hd catheter no redness or drainage Cardiovascular: Normal rate, regular rhythm and normal heart sounds. Exam reveals no gallop and no friction rub.  No murmur heard.  Pulmonary/Chest: Effort  normal and breath sounds normal. No respiratory distress. He has no wheezes.  Abdominal: Soft. Bowel sounds are normal. He exhibits no distension. There is no tenderness.  Lymphadenopathy:  He has no cervical adenopathy.  BOF:BPZW foot wrapped Neurological: He is alert and oriented to person, place, and time.  Skin: Skin is warm and dry. No rash noted. No erythema.  Psychiatric: He has a normal mood and affect. His behavior is normal.   Lab Results Recent Labs    05/08/21 0621 05/09/21 0232 05/10/21 0331  WBC 19.7* 12.6* 9.7  HGB 11.9* 11.4* 12.0*  HCT 35.9* 35.9* 37.0*  NA 134*  --   --   K 3.7  --   --   CL 93*  --   --   CO2 22  --   --   BUN 47*  --   --   CREATININE 16.36*  --   --     Microbiology: 12/17 blood cx ngtd 12/17 OR tissue cx- MSSA 12/15 superficial wound cx - e.faecalis, b.fragilis, and mssa  Studies/Results: ECHOCARDIOGRAM COMPLETE  Result Date: 05/09/2021    ECHOCARDIOGRAM REPORT   Patient Name:   Paul [name] Date of Exam: 05/09/2021 Medical Rec #:  [id number]     Height:       73.0 in Accession #:    [id number]  Weight:       319.0 lb Date of Birth:  1991-05-04      BSA:          2.624 m Patient Age:    30 years      BP:           105/55 mmHg Patient Gender: M             HR:           97 bpm. Exam Location:  Inpatient Procedure: 2D Echo, Color Doppler and Cardiac Doppler Indications:    Bacteremia  History:        Patient has no prior history of Echocardiogram examinations.                 Risk Factors:Diabetes and Hypertension.  Sonographer:    Jyl Heinz Referring Phys: [id number] [place]  1. Left ventricular ejection fraction, by estimation, is 60 to 65%. The left ventricle has normal function. The left ventricle has no regional wall motion abnormalities. Left ventricular diastolic parameters were normal.  2. Right ventricular systolic function is normal. The right ventricular size is normal.  3. The mitral valve is normal in  structure. No evidence of mitral valve regurgitation. No evidence of mitral stenosis.  4. The aortic valve is normal in structure. Aortic valve regurgitation is not visualized. No aortic stenosis is present.  5. The inferior vena cava is normal in size with greater than 50% respiratory variability, suggesting right atrial pressure of 3 mmHg. Conclusion(s)/Recommendation(s): No evidence of valvular vegetations on this transthoracic echocardiogram. Consider a transesophageal echocardiogram to exclude infective endocarditis if clinically indicated. FINDINGS  Left Ventricle: Left ventricular ejection fraction, by estimation, is 60 to 65%. The left ventricle has normal function. The left ventricle has no regional wall motion abnormalities. The left ventricular internal cavity size was normal in size. There is  no left ventricular hypertrophy. Left ventricular diastolic parameters were normal. Right Ventricle: The right ventricular size is normal. No increase in right ventricular wall thickness. Right ventricular systolic function is normal. Left Atrium: Left atrial size was normal in size. Right Atrium: Right atrial size was normal in size. Pericardium: There is no evidence of pericardial effusion. Mitral Valve: The mitral valve is normal in structure. No evidence of mitral valve regurgitation. No evidence of mitral valve stenosis. Tricuspid Valve: The tricuspid valve is normal in structure. Tricuspid valve regurgitation is not demonstrated. No evidence of tricuspid stenosis. Aortic Valve: The aortic valve is normal in structure. Aortic valve regurgitation is not visualized. No aortic stenosis is present. Aortic valve peak gradient measures 7.2 mmHg. Pulmonic Valve: The pulmonic valve was normal in structure. Pulmonic valve regurgitation is not visualized. No evidence of pulmonic stenosis. Aorta: The aortic root is normal in size and structure. Venous: The inferior vena cava is normal in size with greater than 50%  respiratory variability, suggesting right atrial pressure of 3 mmHg. IAS/Shunts: No atrial level shunt detected by color flow Doppler.  LEFT VENTRICLE PLAX 2D LVIDd:         4.10 cm      Diastology LVIDs:         2.90 cm      LV e' medial:    7.94 cm/s LV PW:         1.20 cm      LV E/e' medial:  11.0 LV IVS:        1.10 cm      LV e'  lateral:   10.60 cm/s LVOT diam:     2.20 cm      LV E/e' lateral: 8.2 LV SV:         78 LV SV Index:   30 LVOT Area:     3.80 cm  LV Volumes (MOD) LV vol d, MOD A2C: 127.0 ml LV vol d, MOD A4C: 140.0 ml LV vol s, MOD A2C: 47.8 ml LV vol s, MOD A4C: 52.4 ml LV SV MOD A2C:     79.2 ml LV SV MOD A4C:     140.0 ml LV SV MOD BP:      84.8 ml RIGHT VENTRICLE             IVC RV Basal diam:  3.20 cm     IVC diam: 1.20 cm RV Mid diam:    2.00 cm RV S prime:     15.50 cm/s TAPSE (M-mode): 2.0 cm LEFT ATRIUM             Index        RIGHT ATRIUM           Index LA diam:        3.60 cm 1.37 cm/m   RA Area:     13.80 cm LA Vol (A2C):   40.6 ml 15.47 ml/m  RA Volume:   34.10 ml  13.00 ml/m LA Vol (A4C):   38.0 ml 14.48 ml/m LA Biplane Vol: 39.6 ml 15.09 ml/m  AORTIC VALVE AV Area (Vmax): 3.18 cm AV Vmax:        134.00 cm/s AV Peak Grad:   7.2 mmHg LVOT Vmax:      112.00 cm/s LVOT Vmean:     87.500 cm/s LVOT VTI:       0.205 m  AORTA Ao Root diam: 3.20 cm Ao Asc diam:  3.60 cm MITRAL VALVE MV Area (PHT): 4.89 cm    SHUNTS MV Decel Time: 155 msec    Systemic VTI:  0.20 m MV E velocity: 87.00 cm/s  Systemic Diam: 2.20 cm MV A velocity: 64.00 cm/s MV E/A ratio:  1.36 Mihai Croitoru MD Electronically signed by Sanda Klein MD Signature Date/Time: 05/09/2021/2:08:43 PM    Final      Assessment/Plan: MSSA diabetic foot osteomyelitis with secondary bacteremia s/p 1st ray amputation = will continue with cefazolin for now. Will need line holiday/exchange, anticipate tomorrow since he has HD tomorrow.  Bacteremia has cleared  Polymicrobial wound/osteo of right foot = will do 5 days of  amox/clav for "mop up" in addition to the cefazolin which will be longer, depending on TEE results.-scheduled for Wednesday.  [facility] Pager: [phone]  05/10/2021, 3:22 PM

## 2021-05-10 NOTE — Progress Notes (Signed)
Mobility Specialist Progress Note   05/10/21 1615  Mobility  Activity Ambulated in hall  Level of Assistance Contact guard assist, steadying assist  Assistive Device Front wheel walker  Distance Ambulated (ft) 130 ft (65+65)  Mobility Ambulated with assistance in hallway  Mobility Response Tolerated well  Mobility performed by Mobility specialist  $Mobility charge 1 Mobility   Received pt EOB w/ no complaint and agreeable to mobility. X1 seated break d/t fatigue, pt impulsive and requires min cues for WB precautions when fatigued. Returned back to EOB w/ call bell in reach.   Holland Falling Mobility Specialist Phone Number [phone]

## 2021-05-10 NOTE — Progress Notes (Signed)
Requested to see pt to have out-pt HD clinic changed while pt staying with sister at d/c. Spoke to pt and pt's sister via phone. Pt currently receives out-pt HD at [facility] on TTS. Pt and pt's sister prefer [facility] if possible. Message left for clinic manager, Janett Billow, requesting a return call. Will f/u with pt once clinic responds to navigator. Will assist as needed.  Melven Sartorius Renal Navigator [phone]

## 2021-05-10 NOTE — Progress Notes (Signed)
Mobility Specialist Progress Note   05/10/21 1100  Mobility  Activity Ambulated in room  Level of Assistance Modified independent, requires aide device or extra time  Assistive Device Front wheel walker  Distance Ambulated (ft) 25 ft  Mobility Ambulated with assistance in room  Mobility Response Tolerated well  Mobility performed by Mobility specialist  $Mobility charge 1 Mobility   Receivd pt on EOB having no complaints and agreeable to mobility. Pt requiring min cues on RW mechanics otherwise asx through out. Return back to bed w/ call bell in reach.  Holland Falling Mobility Specialist Phone Number [phone]

## 2021-05-10 NOTE — Progress Notes (Signed)
Pt refusing to wear cardiac monitoring.

## 2021-05-10 NOTE — Progress Notes (Addendum)
Patient ID: Paul [name], male   DOB: 1991-03-27, 30 y.o.   MRN: [id number] S: Feels well, no new complaints.  O:BP 104/71 (BP Location: Left Wrist)    Pulse 93    Temp 98.3 F (36.8 C) (Oral)    Resp 16    Ht 6\' 1"  (1.854 m)    Wt (!) 144.7 kg    SpO2 99%    BMI 42.09 kg/m   Intake/Output Summary (Last 24 hours) at 05/10/2021 0957 Last data filed at 05/09/2021 1746 Gross per 24 hour  Intake 840 ml  Output --  Net 840 ml   Intake/Output: I/O last 3 completed shifts: In: 940 [P.O.:840; IV Piggyback:100] Out: -   Intake/Output this shift:  No intake/output data recorded. Weight change:  Gen:NAD CVS: RRR Resp:CTA Abd: +BS, soft, NT/Nd Ext: no edema, s/p left first ray amputation.  Recent Labs  Lab 05/06/21 1600 05/07/21 0312 05/08/21 0621  NA 134* 132* 134*  K 3.8 3.2* 3.7  CL 92* 94* 93*  CO2 25 23 22   GLUCOSE 163* 158* 109*  BUN 30* 32* 47*  CREATININE 11.66* 13.15* 16.36*  ALBUMIN 4.3  --   --   CALCIUM 9.7 9.2 8.8*  AST 21  --   --   ALT 12  --   --    Liver Function Tests: Recent Labs  Lab 05/06/21 1600  AST 21  ALT 12  ALKPHOS 69  BILITOT 0.7  PROT 9.8*  ALBUMIN 4.3   No results for input(s): LIPASE, AMYLASE in the last 168 hours. No results for input(s): AMMONIA in the last 168 hours. CBC: Recent Labs  Lab 05/06/21 1600 05/07/21 0312 05/08/21 0621 05/09/21 0232 05/10/21 0331  WBC 19.5* 18.2* 19.7* 12.6* 9.7  NEUTROABS 15.0*  --   --   --   --   HGB 14.3 11.9* 11.9* 11.4* 12.0*  HCT 43.0 36.8* 35.9* 35.9* 37.0*  MCV 84.0 84.0 83.7 84.3 84.3  PLT 226 202 237 230 299   Cardiac Enzymes: No results for input(s): CKTOTAL, CKMB, CKMBINDEX, TROPONINI in the last 168 hours. CBG: Recent Labs  Lab 05/09/21 1154 05/09/21 1608 05/09/21 2029 05/10/21 0638 05/10/21 0809  GLUCAP 121* 149* 104* 139* 260*    Iron Studies: No results for input(s): IRON, TIBC, TRANSFERRIN, FERRITIN in the last 72 hours. Studies/Results: ECHOCARDIOGRAM  COMPLETE  Result Date: 05/09/2021    ECHOCARDIOGRAM REPORT   Patient Name:   Paul [name] Date of Exam: 05/09/2021 Medical Rec #:  [id number]     Height:       73.0 in Accession #:    [id number]    Weight:       319.0 lb Date of Birth:  1990-10-26      BSA:          2.624 m Patient Age:    30 years      BP:           105/55 mmHg Patient Gender: M             HR:           97 bpm. Exam Location:  Inpatient Procedure: 2D Echo, Color Doppler and Cardiac Doppler Indications:    Bacteremia  History:        Patient has no prior history of Echocardiogram examinations.                 Risk Factors:Diabetes and Hypertension.  Sonographer:    Jyl Heinz Referring Phys: [phone]  ANDREW N WALLACE IMPRESSIONS  1. Left ventricular ejection fraction, by estimation, is 60 to 65%. The left ventricle has normal function. The left ventricle has no regional wall motion abnormalities. Left ventricular diastolic parameters were normal.  2. Right ventricular systolic function is normal. The right ventricular size is normal.  3. The mitral valve is normal in structure. No evidence of mitral valve regurgitation. No evidence of mitral stenosis.  4. The aortic valve is normal in structure. Aortic valve regurgitation is not visualized. No aortic stenosis is present.  5. The inferior vena cava is normal in size with greater than 50% respiratory variability, suggesting right atrial pressure of 3 mmHg. Conclusion(s)/Recommendation(s): No evidence of valvular vegetations on this transthoracic echocardiogram. Consider a transesophageal echocardiogram to exclude infective endocarditis if clinically indicated. FINDINGS  Left Ventricle: Left ventricular ejection fraction, by estimation, is 60 to 65%. The left ventricle has normal function. The left ventricle has no regional wall motion abnormalities. The left ventricular internal cavity size was normal in size. There is  no left ventricular hypertrophy. Left ventricular diastolic parameters were  normal. Right Ventricle: The right ventricular size is normal. No increase in right ventricular wall thickness. Right ventricular systolic function is normal. Left Atrium: Left atrial size was normal in size. Right Atrium: Right atrial size was normal in size. Pericardium: There is no evidence of pericardial effusion. Mitral Valve: The mitral valve is normal in structure. No evidence of mitral valve regurgitation. No evidence of mitral valve stenosis. Tricuspid Valve: The tricuspid valve is normal in structure. Tricuspid valve regurgitation is not demonstrated. No evidence of tricuspid stenosis. Aortic Valve: The aortic valve is normal in structure. Aortic valve regurgitation is not visualized. No aortic stenosis is present. Aortic valve peak gradient measures 7.2 mmHg. Pulmonic Valve: The pulmonic valve was normal in structure. Pulmonic valve regurgitation is not visualized. No evidence of pulmonic stenosis. Aorta: The aortic root is normal in size and structure. Venous: The inferior vena cava is normal in size with greater than 50% respiratory variability, suggesting right atrial pressure of 3 mmHg. IAS/Shunts: No atrial level shunt detected by color flow Doppler.  LEFT VENTRICLE PLAX 2D LVIDd:         4.10 cm      Diastology LVIDs:         2.90 cm      LV e' medial:    7.94 cm/s LV PW:         1.20 cm      LV E/e' medial:  11.0 LV IVS:        1.10 cm      LV e' lateral:   10.60 cm/s LVOT diam:     2.20 cm      LV E/e' lateral: 8.2 LV SV:         78 LV SV Index:   30 LVOT Area:     3.80 cm  LV Volumes (MOD) LV vol d, MOD A2C: 127.0 ml LV vol d, MOD A4C: 140.0 ml LV vol s, MOD A2C: 47.8 ml LV vol s, MOD A4C: 52.4 ml LV SV MOD A2C:     79.2 ml LV SV MOD A4C:     140.0 ml LV SV MOD BP:      84.8 ml RIGHT VENTRICLE             IVC RV Basal diam:  3.20 cm     IVC diam: 1.20 cm RV Mid diam:    2.00 cm RV S  prime:     15.50 cm/s TAPSE (M-mode): 2.0 cm LEFT ATRIUM             Index        RIGHT ATRIUM           Index LA  diam:        3.60 cm 1.37 cm/m   RA Area:     13.80 cm LA Vol (A2C):   40.6 ml 15.47 ml/m  RA Volume:   34.10 ml  13.00 ml/m LA Vol (A4C):   38.0 ml 14.48 ml/m LA Biplane Vol: 39.6 ml 15.09 ml/m  AORTIC VALVE AV Area (Vmax): 3.18 cm AV Vmax:        134.00 cm/s AV Peak Grad:   7.2 mmHg LVOT Vmax:      112.00 cm/s LVOT Vmean:     87.500 cm/s LVOT VTI:       0.205 m  AORTA Ao Root diam: 3.20 cm Ao Asc diam:  3.60 cm MITRAL VALVE MV Area (PHT): 4.89 cm    SHUNTS MV Decel Time: 155 msec    Systemic VTI:  0.20 m MV E velocity: 87.00 cm/s  Systemic Diam: 2.20 cm MV A velocity: 64.00 cm/s MV E/A ratio:  1.36 Mihai Croitoru MD Electronically signed by Sanda Klein MD Signature Date/Time: 05/09/2021/2:08:43 PM    Final     amoxicillin  500 mg Oral Q24H   Chlorhexidine Gluconate Cloth  6 each Topical Q0600   cinacalcet  60 mg Oral QODAY   ferric citrate  630 mg Oral TID WC   gabapentin  100 mg Oral TID   heparin  5,000 Units Subcutaneous Q8H   insulin aspart  0-6 Units Subcutaneous TID WC & HS   insulin glargine-yfgn  5 Units Subcutaneous Daily   levothyroxine  75 mcg Oral QAC breakfast   melatonin  3 mg Oral QHS   midodrine  5 mg Oral TID WC   multivitamin  1 tablet Oral QPM   rosuvastatin  20 mg Oral Daily   sodium chloride flush  3 mL Intravenous Q12H    BMET    Component Value Date/Time   NA 134 (L) 05/08/2021 0621   NA 139 06/14/2019 0914   K 3.7 05/08/2021 0621   CL 93 (L) 05/08/2021 0621   CO2 22 05/08/2021 0621   GLUCOSE 109 (H) 05/08/2021 0621   BUN 47 (H) 05/08/2021 0621   BUN 31 (H) 06/14/2019 0914   CREATININE 16.36 (H) 05/08/2021 0621   CREATININE 1.85 (H) 11/28/2014 1153   CALCIUM 8.8 (L) 05/08/2021 0621   GFRNONAA 4 (L) 05/08/2021 0621   GFRNONAA 51 (L) 11/28/2014 1153   GFRAA 6 (L) 06/14/2019 0914   GFRAA 58 (L) 11/28/2014 1153   CBC    Component Value Date/Time   WBC 9.7 05/10/2021 0331   RBC 4.39 05/10/2021 0331   HGB 12.0 (L) 05/10/2021 0331   HCT 37.0 (L)  05/10/2021 0331   PLT 299 05/10/2021 0331   MCV 84.3 05/10/2021 0331   MCH 27.3 05/10/2021 0331   MCHC 32.4 05/10/2021 0331   RDW 17.2 (H) 05/10/2021 0331   LYMPHSABS 2.4 05/06/2021 1600   MONOABS 1.7 (H) 05/06/2021 1600   EOSABS 0.1 05/06/2021 1600   BASOSABS 0.1 05/06/2021 1600     [facility]   4.5h   500/1.5   145kg  2/2 bath P2 TDC  Hep 8000  - hect 5ug tiw  - no esa   Assessment/Plan:  Sepsis/ L foot osteo - w/ ^WBC, hypotension. 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  WBC improving down to 9.7.   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 [name], per ID will need a line holiday.  Will consult IR to remove [facility] after HD tomorrow then replace on Thursday. ESRD - on HD TTS. Continue on schedule. Next HD 12/20 Plans to go to sister's house in [place] after discharge --will need transient placement at [facility]. Contact renal navigator for assistance on Monday.  HD access - on HD 3 yrs. 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 Supposed to see [facility] soon about next access. Using [facility] now.  HTN - BP's soft likely due to infection. Better today.  DM2 - on insulin.  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 Cont sensipar and [place].  Anemia ckd - Hb >10, no esa at OP unit. Follow.   Donetta Potts, MD [employer] [phone]

## 2021-05-10 NOTE — Progress Notes (Signed)
PROGRESS NOTE    Paul [name]  OVF:[id number] DOB: 21-Jan-1991 DOA: 05/06/2021 PCP: Orvis Brill, DO   Chief Complaint  Patient presents with   Wound Infection   Brief Narrative/Hospital Course: Paul [name], 30 y.o. male with PMH of ESRD and HD TTS, T2DM, morbid obesity bmi 43, HTN, HLD, anemia of chronic disease, hypothyroidism presented with wound on the left great toe along with redness, pain initially started as a blister and ulcer on the bottom of the left great toe few weeks ago and got worse in last 2 to 3 days. He was seen in the ED systolic blood pressure insulin 80s given IV fluid boluses CT scan of the fluid no periosteal reaction MRI recommended, also had leukocytosis lactic acidosis abnormal LFTs and concern about severe sepsis PCCM was consulted and after discussion transferred under hospitalist for admission for severe sepsis. 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  Resting comfortably left lower extremity is wrapped with dressing wound VAC is removed. Afebrile overnight. He feels better.  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 Water culture also growing MSSA.  Continue Ancef , amoxycillin as per ID recommending line removal and line holiday-has HD tomorrow can probably remove line after that paged Dr. Marval Regal to discuss.Repeat blood culture 12/17 pending. Echocardiogram unremarkable Recent Labs  Lab 05/06/21 1600 05/06/21 1740 05/07/21 0312 05/07/21 0610 05/08/21 0621 05/09/21 0232 05/10/21 0331  WBC 19.5*  --  18.2*  --  19.7* 12.6* 9.7  LATICACIDVEN 2.3* 2.0*  1.0 1.2  --   --   --      T2DM on insulin with CKD and diabetic neuropathy controlled on SSI, Lantus. Stable HbA1c 6 , started on Neurontin for neuropathy. Recent Labs  Lab 05/09/21 1608 05/09/21 2029 05/10/21 0638 05/10/21 0809 05/10/21 1131  GLUCAP 149* 104* 139* 260* 123*    HTN: BP soft, holding BP meds due to sepsis.  Patient-placed on midodrine ACZ:YSAY on statin ESRD and HD TTS: Nephrology consulted for dialysis, s/p HD 12/17 -he needs line holiday. Anemia of chronic disease: stabe, monitor hemoglobin Recent Labs  Lab 05/06/21 1600 05/07/21 0312 05/08/21 0621 05/09/21 0232 05/10/21 0331  HGB 14.3 11.9* 11.9* 11.4* 12.0*  HCT 43.0 36.8* 35.9* 30.1* 60.1*  Metabolic bone disease: Monitor calcium and Phos.resume his home medication  Hypokalemia-stable. Recent Labs  Lab 05/06/21 1600 05/07/21 0312 05/08/21 0621  K 3.8 3.2* 3.7  Hypothyroidism: cont Synthroid Class III Obesity:Patient's Body mass index is 42.09 kg/m. : Will benefit with PCP follow-up, weight loss  healthy lifestyle and outpatient sleep evaluation.  DVT prophylaxis: heparin injection 5,000 Units Start: 05/07/21 1400 Code Status:   Code Status: Full Code Family Communication: plan of care discussed with patient at bedside. Status is: Inpatient Remains inpatient appropriate because: For IV antibiotics and management of osteomyelitis Disposition: Currently not medically stable for discharge. Anticipated Disposition: To sisters home in [place] patient requesting dialysis as per the [facility] while he is at sister's house- SW informed.  Objective: Vitals last 24 hrs: Vitals:   05/10/21 0000 05/10/21 0514 05/10/21 0810 05/10/21 1132  BP: 116/74 (!) 109/97 104/71 (!) 134/91  Pulse: 92 87 93 92  Resp: 18 18 16 16   Temp: 98 F (36.7 C) 97.8 F (36.6 C) 98.3 F (36.8 C) 98.2 F (36.8 C)  TempSrc: Oral Oral Oral Oral  SpO2: 98% 98% 99% 99%  Weight:      Height:        Weight change:   Intake/Output Summary (Last 24 hours) at 05/10/2021 1134 Last data filed at 05/09/2021 1746 Gross per 24 hour  Intake 840 ml  Output --  Net 840 ml   Net IO Since Admission: 1,452.94 mL [05/10/21 1134]   Physical Examination: General exam: AAOx 3, pleasant NAD HEENT:Oral mucosa moist, Ear/Nose WNL grossly, dentition normal. Respiratory system: bilaterally clear, no use of accessory muscle Cardiovascular system: S1 & S2 +, No JVD,. Gastrointestinal system: Abdomen soft, NT,ND, BS+ Nervous System:Alert, awake, moving extremities and grossly nonfocal Extremities: NO edema, left foot with dressing in place distal peripheral pulses palpable.  Skin: No rashes,no icterus. MSK: Normal muscle bulk,tone, power   Medications reviewed:  Scheduled Meds:  amoxicillin  500 mg Oral Q24H   Chlorhexidine Gluconate Cloth  6 each Topical Q0600   cinacalcet  60 mg Oral QODAY   ferric citrate  630 mg Oral TID WC   gabapentin  100 mg Oral TID   heparin  5,000 Units Subcutaneous Q8H   insulin aspart  0-6 Units Subcutaneous TID WC & HS   insulin glargine-yfgn  5 Units Subcutaneous Daily   levothyroxine  75 mcg Oral QAC breakfast   melatonin  3 mg Oral QHS   midodrine  5 mg Oral TID WC   multivitamin  1 tablet Oral QPM   rosuvastatin  20 mg Oral Daily   sodium chloride flush  3 mL Intravenous Q12H   Continuous Infusions:  sodium chloride      ceFAZolin (ANCEF) IV 2 g (05/08/21 1856)    Diet Order             Diet renal/carb modified with fluid restriction Diet-HS Snack? Nothing; Fluid restriction: 1200 mL Fluid; Room service appropriate? Yes; Fluid consistency: Thin  Diet effective now                          Weight change:   Wt Readings from Last 3 Encounters:  05/08/21 (!) 144.7 kg  03/26/21 (!) 149.7 kg  03/17/21 (!) 151 kg     Consultants:see note  Procedures:see note Antimicrobials: Anti-infectives (From admission, onward)    Start     Dose/Rate  Route Frequency Ordered Stop   05/08/21 1800  ceFAZolin (ANCEF) IVPB 2g/100 mL premix        2 g 200 mL/hr over 30 Minutes Intravenous Every T-Th-Sa (1800) 05/07/21 1239     05/08/21 1600  amoxicillin (AMOXIL) capsule 500 mg        500 mg Oral Every 24 hours 05/08/21 1535     05/08/21 1200  vancomycin (VANCOCIN) IVPB 1000 mg/200 mL premix  Status:  Discontinued        1,000 mg 200 mL/hr over 60 Minutes Intravenous Every T-Th-Sa (Hemodialysis) 05/07/21 0233 05/07/21 1239   05/08/21 0600  ceFAZolin (ANCEF) IVPB 3g/100 mL premix        3 g 200 mL/hr over 30 Minutes Intravenous On call to O.R. 05/07/21 1857 05/08/21 0755   05/07/21 1530  ceFAZolin (ANCEF) IVPB 2g/100 mL premix  Status:  Discontinued        2 g 200 mL/hr over  30 Minutes Intravenous To Radiology 05/07/21 1436 05/07/21 1615   05/06/21 1700  vancomycin (VANCOCIN) IVPB 1000 mg/200 mL premix        1,000 mg 200 mL/hr over 60 Minutes Intravenous Every 1 hr x 2 05/06/21 1621 05/06/21 2005   05/06/21 1630  ceFEPIme (MAXIPIME) 2 g in sodium chloride 0.9 % 100 mL IVPB        2 g 200 mL/hr over 30 Minutes Intravenous  Once 05/06/21 1621 05/06/21 1701   05/06/21 1630  metroNIDAZOLE (FLAGYL) IVPB 500 mg        500 mg 100 mL/hr over 60 Minutes Intravenous  Once 05/06/21 1621 05/06/21 1728   05/06/21 1615  piperacillin-tazobactam (ZOSYN) IVPB 2.25 g  Status:  Discontinued        2.25 g 100 mL/hr over 30 Minutes Intravenous  Once 05/06/21 1614 05/06/21 1621      Culture/Microbiology    Component Value Date/Time   SDES TISSUE 05/08/2021 0810   SPECREQUEST TIS FROM GREAT LEFT TOE SPEC A 05/08/2021 0810   CULT  05/08/2021 0810    MODERATE STAPHYLOCOCCUS AUREUS SUSCEPTIBILITIES TO FOLLOW Performed at [facility] [address], [place], [place] [address]    REPTSTATUS PENDING 05/08/2021 1443    Other culture-see note  Unresulted Labs (From admission, onward)     Start     Ordered   05/07/21 1437  Hepatitis B surface  antibody,quantitative  (New Admission Hemo Labs (Hepatitis B))  Once,   R       Question:  Specimen collection method  Answer:  Lab=Lab collect   05/07/21 1436   05/06/21 1602  Urinalysis, Routine w reflex microscopic  (Septic presentation on arrival (screening labs, nursing and treatment orders for obvious sepsis))  ONCE - STAT,   STAT        05/06/21 1605   Signed and Held  Renal function panel  Once,   R       Question:  Specimen collection method  Answer:  Lab=Lab collect   Signed and Held   Signed and Held  CBC  Once,   R       Question:  Specimen collection method  Answer:  Lab=Lab collect   Signed and Held          Data Reviewed: I have personally reviewed following labs and imaging studies CBC: Recent Labs  Lab 05/06/21 1600 05/07/21 0312 05/08/21 0621 05/09/21 0232 05/10/21 0331  WBC 19.5* 18.2* 19.7* 12.6* 9.7  NEUTROABS 15.0*  --   --   --   --   HGB 14.3 11.9* 11.9* 11.4* 12.0*  HCT 43.0 36.8* 35.9* 35.9* 37.0*  MCV 84.0 84.0 83.7 84.3 84.3  PLT 226 202 237 230 154   Basic Metabolic Panel: Recent Labs  Lab 05/06/21 1600 05/07/21 0312 05/08/21 0621  NA 134* 132* 134*  K 3.8 3.2* 3.7  CL 92* 94* 93*  CO2 25 23 22   GLUCOSE 163* 158* 109*  BUN 30* 32* 47*  CREATININE 11.66* 13.15* 16.36*  CALCIUM 9.7 9.2 8.8*   GFR: Estimated Creatinine Clearance: 10.1 mL/min (A) (by C-G formula based on SCr of 16.36 mg/dL (H)). Liver Function Tests: Recent Labs  Lab 05/06/21 1600  AST 21  ALT 12  ALKPHOS 69  BILITOT 0.7  PROT 9.8*  ALBUMIN 4.3   No results for input(s): LIPASE, AMYLASE in the last 168 hours. No results for input(s): AMMONIA in the last 168 hours. Coagulation Profile: Recent Labs  Lab 05/06/21 1600  INR 1.3*   Cardiac Enzymes: No results for input(s): CKTOTAL, CKMB, CKMBINDEX, TROPONINI in the last 168 hours. BNP (last 3 results) No results for input(s): PROBNP in the last 8760 hours. HbA1C: No results for input(s): HGBA1C in the last  72 hours.  CBG: Recent Labs  Lab 05/09/21 1608 05/09/21 2029 05/10/21 0638 05/10/21 0809 05/10/21 1131  GLUCAP 149* 104* 139* 260* 123*   Lipid Profile: No results for input(s): CHOL, HDL, LDLCALC, TRIG, CHOLHDL, LDLDIRECT in the last 72 hours. Thyroid Function Tests: No results for input(s): TSH, T4TOTAL, FREET4, T3FREE, THYROIDAB in the last 72 hours. Anemia Panel: No results for input(s): VITAMINB12, FOLATE, FERRITIN, TIBC, IRON, RETICCTPCT in the last 72 hours. Sepsis Labs: Recent Labs  Lab 05/06/21 1600 05/06/21 1740 05/07/21 0312 05/07/21 0610  LATICACIDVEN 2.3* 2.0* 1.0 1.2    Recent Results (from the past 240 hour(s))  Resp Panel by RT-PCR (Flu A&B, Covid) Nasopharyngeal Swab     Status: None   Collection Time: 05/06/21  3:37 PM   Specimen: Nasopharyngeal Swab; Nasopharyngeal(NP) swabs in vial transport medium  Result Value Ref Range Status   SARS Coronavirus 2 by RT PCR NEGATIVE NEGATIVE Final    Comment: (NOTE) SARS-CoV-2 target nucleic acids are NOT DETECTED.  The SARS-CoV-2 RNA is generally detectable in upper respiratory specimens during the acute phase of infection. The lowest concentration of SARS-CoV-2 viral copies this assay can detect is 138 copies/mL. A negative result does not preclude SARS-Cov-2 infection and should not be used as the sole basis for treatment or other patient management decisions. A negative result may occur with  improper specimen collection/handling, submission of specimen other than nasopharyngeal swab, presence of viral mutation(s) within the areas targeted by this assay, and inadequate number of viral copies(<138 copies/mL). A negative result must be combined with clinical observations, patient history, and epidemiological information. The expected result is Negative.  Fact Sheet for Patients:  EntrepreneurPulse.com.au  Fact Sheet for Healthcare Providers:   IncredibleEmployment.be  This test is no t yet approved or cleared by the [place] FDA and  has been authorized for detection and/or diagnosis of SARS-CoV-2 by FDA under an Emergency Use Authorization (EUA). This EUA will remain  in effect (meaning this test can be used) for the duration of the COVID-19 declaration under Section 564(b)(1) of the Act, 21 U.S.C.section 360bbb-3(b)(1), unless the authorization is terminated  or revoked sooner.       Influenza A by PCR NEGATIVE NEGATIVE Final   Influenza B by PCR NEGATIVE NEGATIVE Final    Comment: (NOTE) The Xpert Xpress SARS-CoV-2/FLU/RSV plus assay is intended as an aid in the diagnosis of influenza from Nasopharyngeal swab specimens and should not be used as a sole basis for treatment. Nasal washings and aspirates are unacceptable for Xpert Xpress SARS-CoV-2/FLU/RSV testing.  Fact Sheet for Patients: EntrepreneurPulse.com.au  Fact Sheet for Healthcare Providers: IncredibleEmployment.be  This test is not yet approved or cleared by the [place] FDA and has been authorized for detection and/or diagnosis of SARS-CoV-2 by FDA under an Emergency Use Authorization (EUA). This EUA will remain in effect (meaning this test can be used) for the duration of the COVID-19 declaration under Section 564(b)(1) of the Act, 21 U.S.C. section 360bbb-3(b)(1), unless the authorization is terminated or revoked.  Performed at [facility], [address] [place], [place] [address]   Culture, blood (x 2)     Status: Abnormal   Collection Time: 05/06/21  4:00  PM   Specimen: BLOOD  Result Value Ref Range Status   Specimen Description   Final    BLOOD BLOOD RIGHT HAND Performed at [facility], [address] [place], [place] [address]    Special Requests   Final    BOTTLES DRAWN AEROBIC ONLY Blood Culture results may not be optimal due to an inadequate volume  of blood received in culture bottles Performed at [facility], [address] [place], [place] [address]    Culture  Setup Time   Final    GRAM POSITIVE COCCI AEROBIC BOTTLE ONLY CRITICAL RESULT CALLED TO, READ BACK BY AND VERIFIED WITH: PHARMD GREG ABBOTT 05/08/21@4 :17 BY TW    Culture (A)  Final    STAPHYLOCOCCUS AUREUS SUSCEPTIBILITIES PERFORMED ON PREVIOUS CULTURE WITHIN THE LAST 5 DAYS. Performed at [facility], [place] [address], [place], [place] [address]    Report Status 05/09/2021 FINAL  Final  Blood Culture ID Panel (Reflexed)     Status: Abnormal   Collection Time: 05/06/21  4:00 PM  Result Value Ref Range Status   Enterococcus faecalis NOT DETECTED NOT DETECTED Final   Enterococcus Faecium NOT DETECTED NOT DETECTED Final   Listeria monocytogenes NOT DETECTED NOT DETECTED Final   Staphylococcus species DETECTED (A) NOT DETECTED Final    Comment: CRITICAL RESULT CALLED TO, READ BACK BY AND VERIFIED WITH: PHARMD GREG ABBOTT 05/08/21@4 :17 BY TW    Staphylococcus aureus (BCID) DETECTED (A) NOT DETECTED Final    Comment: CRITICAL RESULT CALLED TO, READ BACK BY AND VERIFIED WITH: PHARMD GREG ABBOTT 05/08/21@4 :17 BY TW    Staphylococcus epidermidis NOT DETECTED NOT DETECTED Final   Staphylococcus lugdunensis NOT DETECTED NOT DETECTED Final   Streptococcus species NOT DETECTED NOT DETECTED Final   Streptococcus agalactiae NOT DETECTED NOT DETECTED Final   Streptococcus pneumoniae NOT DETECTED NOT DETECTED Final   Streptococcus pyogenes NOT DETECTED NOT DETECTED Final   A.calcoaceticus-baumannii NOT DETECTED NOT DETECTED Final   Bacteroides fragilis NOT DETECTED NOT DETECTED Final   Enterobacterales NOT DETECTED NOT DETECTED Final   Enterobacter cloacae complex NOT DETECTED NOT DETECTED Final   Escherichia coli NOT DETECTED NOT DETECTED Final   Klebsiella aerogenes NOT DETECTED NOT DETECTED Final   Klebsiella oxytoca NOT DETECTED NOT DETECTED Final    Klebsiella pneumoniae NOT DETECTED NOT DETECTED Final   Proteus species NOT DETECTED NOT DETECTED Final   Salmonella species NOT DETECTED NOT DETECTED Final   Serratia marcescens NOT DETECTED NOT DETECTED Final   Haemophilus influenzae NOT DETECTED NOT DETECTED Final   Neisseria meningitidis NOT DETECTED NOT DETECTED Final   Pseudomonas aeruginosa NOT DETECTED NOT DETECTED Final   Stenotrophomonas maltophilia NOT DETECTED NOT DETECTED Final   Candida albicans NOT DETECTED NOT DETECTED Final   Candida auris NOT DETECTED NOT DETECTED Final   Candida glabrata NOT DETECTED NOT DETECTED Final   Candida krusei NOT DETECTED NOT DETECTED Final   Candida parapsilosis NOT DETECTED NOT DETECTED Final   Candida tropicalis NOT DETECTED NOT DETECTED Final   Cryptococcus neoformans/gattii NOT DETECTED NOT DETECTED Final   Meth resistant mecA/C and MREJ NOT DETECTED NOT DETECTED Final    Comment: Performed at [facility], [address] [place], [place] [address]  Culture, blood (x 2)     Status: Abnormal   Collection Time: 05/06/21  4:15 PM   Specimen: BLOOD  Result Value Ref Range Status   Specimen Description   Final  BLOOD BLOOD RIGHT HAND Performed at [facility], [address] [place], [place] [address]    Special Requests   Final    BOTTLES DRAWN AEROBIC AND ANAEROBIC Blood Culture adequate volume Performed at [facility], [address] [place], [place] [address]    Culture  Setup Time   Final    GRAM POSITIVE COCCI IN CLUSTERS IN BOTH AEROBIC AND ANAEROBIC BOTTLES CRITICAL RESULT CALLED TO, READ BACK BY AND VERIFIED WITH: PHARMD J.FRENS AT 5284 ON 05/07/2021 BY T.SAAD. Performed at [facility], [place] [address], [place], [place] [address]    Culture STAPHYLOCOCCUS AUREUS (A)  Final   Report Status 05/09/2021 FINAL  Final   Organism ID, Bacteria STAPHYLOCOCCUS AUREUS  Final      Susceptibility   Staphylococcus aureus - MIC*    CIPROFLOXACIN  >=8 RESISTANT Resistant     ERYTHROMYCIN <=0.25 SENSITIVE Sensitive     GENTAMICIN <=0.5 SENSITIVE Sensitive     OXACILLIN <=0.25 SENSITIVE Sensitive     TETRACYCLINE <=1 SENSITIVE Sensitive     VANCOMYCIN 1 SENSITIVE Sensitive     TRIMETH/SULFA <=10 SENSITIVE Sensitive     CLINDAMYCIN <=0.25 SENSITIVE Sensitive     RIFAMPIN <=0.5 SENSITIVE Sensitive     Inducible Clindamycin NEGATIVE Sensitive     * STAPHYLOCOCCUS AUREUS  Blood Culture ID Panel (Reflexed)     Status: Abnormal   Collection Time: 05/06/21  4:15 PM  Result Value Ref Range Status   Enterococcus faecalis NOT DETECTED NOT DETECTED Final   Enterococcus Faecium NOT DETECTED NOT DETECTED Final   Listeria monocytogenes NOT DETECTED NOT DETECTED Final   Staphylococcus species DETECTED (A) NOT DETECTED Final    Comment: CRITICAL RESULT CALLED TO, READ BACK BY AND VERIFIED WITH: PHARMD J.FRENS AT 0102 ON 05/07/2021 BY T.SAAD.    Staphylococcus aureus (BCID) DETECTED (A) NOT DETECTED Final    Comment: CRITICAL RESULT CALLED TO, READ BACK BY AND VERIFIED WITH: PHARMD J.FRENS AT 7253 ON 05/07/2021 BY T.SAAD.    Staphylococcus epidermidis NOT DETECTED NOT DETECTED Final   Staphylococcus lugdunensis NOT DETECTED NOT DETECTED Final   Streptococcus species NOT DETECTED NOT DETECTED Final   Streptococcus agalactiae NOT DETECTED NOT DETECTED Final   Streptococcus pneumoniae NOT DETECTED NOT DETECTED Final   Streptococcus pyogenes NOT DETECTED NOT DETECTED Final   A.calcoaceticus-baumannii NOT DETECTED NOT DETECTED Final   Bacteroides fragilis NOT DETECTED NOT DETECTED Final   Enterobacterales NOT DETECTED NOT DETECTED Final   Enterobacter cloacae complex NOT DETECTED NOT DETECTED Final   Escherichia coli NOT DETECTED NOT DETECTED Final   Klebsiella aerogenes NOT DETECTED NOT DETECTED Final   Klebsiella oxytoca NOT DETECTED NOT DETECTED Final   Klebsiella pneumoniae NOT DETECTED NOT DETECTED Final   Proteus species NOT DETECTED NOT  DETECTED Final   Salmonella species NOT DETECTED NOT DETECTED Final   Serratia marcescens NOT DETECTED NOT DETECTED Final   Haemophilus influenzae NOT DETECTED NOT DETECTED Final   Neisseria meningitidis NOT DETECTED NOT DETECTED Final   Pseudomonas aeruginosa NOT DETECTED NOT DETECTED Final   Stenotrophomonas maltophilia NOT DETECTED NOT DETECTED Final   Candida albicans NOT DETECTED NOT DETECTED Final   Candida auris NOT DETECTED NOT DETECTED Final   Candida glabrata NOT DETECTED NOT DETECTED Final   Candida krusei NOT DETECTED NOT DETECTED Final   Candida parapsilosis NOT DETECTED NOT DETECTED Final   Candida tropicalis NOT DETECTED NOT DETECTED Final   Cryptococcus neoformans/gattii NOT DETECTED NOT  DETECTED Final   Meth resistant mecA/C and MREJ NOT DETECTED NOT DETECTED Final    Comment: Performed at [facility], [place] [address] [place], [place] [address]  Aerobic/Anaerobic Culture w Gram Stain (surgical/deep wound)     Status: None (Preliminary result)   Collection Time: 05/06/21  7:47 PM   Specimen: Toe; Wound  Result Value Ref Range Status   Specimen Description   Final    TOE Performed at [facility], [address] [place], [place] [address]    Special Requests   Final    Immunocompromised Performed at [facility], [address] [place], [place] [address]    Gram Stain   Final    NO WBC SEEN MODERATE GRAM POSITIVE COCCI IN PAIRS    Culture   Final    ABUNDANT STAPHYLOCOCCUS AUREUS MODERATE ENTEROCOCCUS FAECALIS SUSCEPTIBILITIES TO FOLLOW HOLDING FOR POSSIBLE ANAEROBE Performed at [facility], [place] [address] [place], [place] [address]    Report Status PENDING  Incomplete   Organism ID, Bacteria STAPHYLOCOCCUS AUREUS  Final      Susceptibility   Staphylococcus aureus - MIC*    CIPROFLOXACIN >=8 RESISTANT Resistant     ERYTHROMYCIN <=0.25 SENSITIVE Sensitive     GENTAMICIN <=0.5 SENSITIVE Sensitive     OXACILLIN <=0.25  SENSITIVE Sensitive     TETRACYCLINE <=1 SENSITIVE Sensitive     VANCOMYCIN <=0.5 SENSITIVE Sensitive     TRIMETH/SULFA <=10 SENSITIVE Sensitive     CLINDAMYCIN <=0.25 SENSITIVE Sensitive     RIFAMPIN <=0.5 SENSITIVE Sensitive     Inducible Clindamycin NEGATIVE Sensitive     * ABUNDANT STAPHYLOCOCCUS AUREUS  MRSA Next Gen by PCR, Nasal     Status: None   Collection Time: 05/08/21  2:17 AM   Specimen: Nasal Mucosa; Nasal Swab  Result Value Ref Range Status   MRSA by PCR Next Gen NOT DETECTED NOT DETECTED Final    Comment: (NOTE) The GeneXpert MRSA Assay (FDA approved for NASAL specimens only), is one component of a comprehensive MRSA colonization surveillance program. It is not intended to diagnose MRSA infection nor to guide or monitor treatment for MRSA infections. Test performance is not FDA approved in patients less than 30 years old. Performed at [facility], [place] [address] [place], [place] [address]   Culture, blood (routine x 2)     Status: None (Preliminary result)   Collection Time: 05/08/21  6:11 AM   Specimen: BLOOD RIGHT HAND  Result Value Ref Range Status   Specimen Description BLOOD RIGHT HAND  Final   Special Requests   Final    BOTTLES DRAWN AEROBIC AND ANAEROBIC Blood Culture results may not be optimal due to an inadequate volume of blood received in culture bottles   Culture   Final    NO GROWTH 2 DAYS Performed at [facility], [place] [address] [place], [place] [address]    Report Status PENDING  Incomplete  Culture, blood (routine x 2)     Status: None (Preliminary result)   Collection Time: 05/08/21  6:26 AM   Specimen: BLOOD  Result Value Ref Range Status   Specimen Description BLOOD LEFT ANTECUBITAL  Final   Special Requests AEROBIC BOTTLE ONLY Blood Culture adequate volume  Final   Culture   Final    NO GROWTH 2 DAYS Performed at [facility], [place] [address], [place], [place] [address]    Report Status  PENDING  Incomplete   Aerobic/Anaerobic Culture w Gram Stain (surgical/deep wound)     Status: None (Preliminary result)   Collection Time: 05/08/21  8:10 AM   Specimen: PATH Other; Tissue  Result Value Ref Range Status   Specimen Description TISSUE  Final   Special Requests TIS FROM GREAT LEFT TOE SPEC A  Final   Gram Stain   Final    FEW WBC PRESENT, PREDOMINANTLY PMN FEW GRAM POSITIVE COCCI    Culture   Final    MODERATE STAPHYLOCOCCUS AUREUS SUSCEPTIBILITIES TO FOLLOW Performed at [facility], [place] [address], [place], [place] [address]    Report Status PENDING  Incomplete     Radiology Studies: ECHOCARDIOGRAM COMPLETE  Result Date: 05/09/2021    ECHOCARDIOGRAM REPORT   Patient Name:   DARREL GLOSS Date of Exam: 05/09/2021 Medical Rec #:  [id number]     Height:       73.0 in Accession #:    [id number]    Weight:       319.0 lb Date of Birth:  07-17-1990      BSA:          2.624 m Patient Age:    30 years      BP:           105/55 mmHg Patient Gender: M             HR:           97 bpm. Exam Location:  Inpatient Procedure: 2D Echo, Color Doppler and Cardiac Doppler Indications:    Bacteremia  History:        Patient has no prior history of Echocardiogram examinations.                 Risk Factors:Diabetes and Hypertension.  Sonographer:    Jyl Heinz Referring Phys: [id number] [place]  1. Left ventricular ejection fraction, by estimation, is 60 to 65%. The left ventricle has normal function. The left ventricle has no regional wall motion abnormalities. Left ventricular diastolic parameters were normal.  2. Right ventricular systolic function is normal. The right ventricular size is normal.  3. The mitral valve is normal in structure. No evidence of mitral valve regurgitation. No evidence of mitral stenosis.  4. The aortic valve is normal in structure. Aortic valve regurgitation is not visualized. No aortic stenosis is present.  5. The inferior vena cava is normal in size with greater  than 50% respiratory variability, suggesting right atrial pressure of 3 mmHg. Conclusion(s)/Recommendation(s): No evidence of valvular vegetations on this transthoracic echocardiogram. Consider a transesophageal echocardiogram to exclude infective endocarditis if clinically indicated. FINDINGS  Left Ventricle: Left ventricular ejection fraction, by estimation, is 60 to 65%. The left ventricle has normal function. The left ventricle has no regional wall motion abnormalities. The left ventricular internal cavity size was normal in size. There is  no left ventricular hypertrophy. Left ventricular diastolic parameters were normal. Right Ventricle: The right ventricular size is normal. No increase in right ventricular wall thickness. Right ventricular systolic function is normal. Left Atrium: Left atrial size was normal in size. Right Atrium: Right atrial size was normal in size. Pericardium: There is no evidence of pericardial effusion. Mitral Valve: The mitral valve is normal in structure. No evidence of mitral valve regurgitation. No evidence of mitral valve stenosis. Tricuspid Valve: The tricuspid valve is normal in structure. Tricuspid valve regurgitation is not demonstrated. No evidence of tricuspid stenosis. Aortic Valve: The aortic  valve is normal in structure. Aortic valve regurgitation is not visualized. No aortic stenosis is present. Aortic valve peak gradient measures 7.2 mmHg. Pulmonic Valve: The pulmonic valve was normal in structure. Pulmonic valve regurgitation is not visualized. No evidence of pulmonic stenosis. Aorta: The aortic root is normal in size and structure. Venous: The inferior vena cava is normal in size with greater than 50% respiratory variability, suggesting right atrial pressure of 3 mmHg. IAS/Shunts: No atrial level shunt detected by color flow Doppler.  LEFT VENTRICLE PLAX 2D LVIDd:         4.10 cm      Diastology LVIDs:         2.90 cm      LV e' medial:    7.94 cm/s LV PW:         1.20  cm      LV E/e' medial:  11.0 LV IVS:        1.10 cm      LV e' lateral:   10.60 cm/s LVOT diam:     2.20 cm      LV E/e' lateral: 8.2 LV SV:         78 LV SV Index:   30 LVOT Area:     3.80 cm  LV Volumes (MOD) LV vol d, MOD A2C: 127.0 ml LV vol d, MOD A4C: 140.0 ml LV vol s, MOD A2C: 47.8 ml LV vol s, MOD A4C: 52.4 ml LV SV MOD A2C:     79.2 ml LV SV MOD A4C:     140.0 ml LV SV MOD BP:      84.8 ml RIGHT VENTRICLE             IVC RV Basal diam:  3.20 cm     IVC diam: 1.20 cm RV Mid diam:    2.00 cm RV S prime:     15.50 cm/s TAPSE (M-mode): 2.0 cm LEFT ATRIUM             Index        RIGHT ATRIUM           Index LA diam:        3.60 cm 1.37 cm/m   RA Area:     13.80 cm LA Vol (A2C):   40.6 ml 15.47 ml/m  RA Volume:   34.10 ml  13.00 ml/m LA Vol (A4C):   38.0 ml 14.48 ml/m LA Biplane Vol: 39.6 ml 15.09 ml/m  AORTIC VALVE AV Area (Vmax): 3.18 cm AV Vmax:        134.00 cm/s AV Peak Grad:   7.2 mmHg LVOT Vmax:      112.00 cm/s LVOT Vmean:     87.500 cm/s LVOT VTI:       0.205 m  AORTA Ao Root diam: 3.20 cm Ao Asc diam:  3.60 cm MITRAL VALVE MV Area (PHT): 4.89 cm    SHUNTS MV Decel Time: 155 msec    Systemic VTI:  0.20 m MV E velocity: 87.00 cm/s  Systemic Diam: 2.20 cm MV A velocity: 64.00 cm/s MV E/A ratio:  1.36 Mihai Croitoru MD Electronically signed by Sanda Klein MD Signature Date/Time: 05/09/2021/2:08:43 PM    Final      LOS: 3 days   Antonieta Pert, MD [facility]  05/10/2021, 11:34 AM

## 2021-05-11 ENCOUNTER — Inpatient Hospital Stay (HOSPITAL_COMMUNITY): Payer: Medicaid Other

## 2021-05-11 DIAGNOSIS — Z4901 Encounter for fitting and adjustment of extracorporeal dialysis catheter: Secondary | ICD-10-CM | POA: Diagnosis not present

## 2021-05-11 DIAGNOSIS — R7881 Bacteremia: Secondary | ICD-10-CM | POA: Diagnosis not present

## 2021-05-11 DIAGNOSIS — I120 Hypertensive chronic kidney disease with stage 5 chronic kidney disease or end stage renal disease: Secondary | ICD-10-CM | POA: Diagnosis not present

## 2021-05-11 DIAGNOSIS — N186 End stage renal disease: Secondary | ICD-10-CM | POA: Diagnosis not present

## 2021-05-11 DIAGNOSIS — E1129 Type 2 diabetes mellitus with other diabetic kidney complication: Secondary | ICD-10-CM | POA: Diagnosis not present

## 2021-05-11 DIAGNOSIS — A419 Sepsis, unspecified organism: Secondary | ICD-10-CM | POA: Diagnosis not present

## 2021-05-11 DIAGNOSIS — Z794 Long term (current) use of insulin: Secondary | ICD-10-CM | POA: Diagnosis not present

## 2021-05-11 DIAGNOSIS — Z992 Dependence on renal dialysis: Secondary | ICD-10-CM | POA: Diagnosis not present

## 2021-05-11 DIAGNOSIS — N250 Renal osteodystrophy: Secondary | ICD-10-CM | POA: Diagnosis not present

## 2021-05-11 DIAGNOSIS — B9561 Methicillin susceptible Staphylococcus aureus infection as the cause of diseases classified elsewhere: Secondary | ICD-10-CM | POA: Diagnosis not present

## 2021-05-11 HISTORY — PX: IR REMOVAL TUN CV CATH W/O FL: IMG2289

## 2021-05-11 LAB — CBC
HCT: 35 % — ABNORMAL LOW (ref 39.0–52.0)
Hemoglobin: 11.3 g/dL — ABNORMAL LOW (ref 13.0–17.0)
MCH: 27 pg (ref 26.0–34.0)
MCHC: 32.3 g/dL (ref 30.0–36.0)
MCV: 83.7 fL (ref 80.0–100.0)
Platelets: 362 10*3/uL (ref 150–400)
RBC: 4.18 MIL/uL — ABNORMAL LOW (ref 4.22–5.81)
RDW: 17.2 % — ABNORMAL HIGH (ref 11.5–15.5)
WBC: 10 10*3/uL (ref 4.0–10.5)
nRBC: 0 % (ref 0.0–0.2)

## 2021-05-11 LAB — GLUCOSE, CAPILLARY
Glucose-Capillary: 122 mg/dL — ABNORMAL HIGH (ref 70–99)
Glucose-Capillary: 132 mg/dL — ABNORMAL HIGH (ref 70–99)
Glucose-Capillary: 197 mg/dL — ABNORMAL HIGH (ref 70–99)
Glucose-Capillary: 126 mg/dL — ABNORMAL HIGH (ref 70–99)

## 2021-05-11 LAB — HEPATITIS B SURFACE ANTIBODY, QUANTITATIVE: Hep B S AB Quant (Post): 31.4 m[IU]/mL (ref >9.9)

## 2021-05-11 LAB — RENAL FUNCTION PANEL
Albumin: 2.9 g/dL — ABNORMAL LOW (ref 3.5–5.0)
Anion gap: 14 (ref 5–15)
BUN: 63 mg/dL — ABNORMAL HIGH (ref 6–20)
CO2: 22 mmol/L (ref 22–32)
Calcium: 8.9 mg/dL (ref 8.9–10.3)
Chloride: 97 mmol/L — ABNORMAL LOW (ref 98–111)
Creatinine, Ser: 16.25 mg/dL — ABNORMAL HIGH (ref 0.61–1.24)
GFR, Estimated: 4 mL/min — ABNORMAL LOW (ref >60)
Glucose, Bld: 199 mg/dL — ABNORMAL HIGH (ref 70–99)
Phosphorus: 4 mg/dL (ref 2.5–4.6)
Potassium: 3.9 mmol/L (ref 3.5–5.1)
Sodium: 133 mmol/L — ABNORMAL LOW (ref 135–145)

## 2021-05-11 MED ORDER — CEFAZOLIN SODIUM-DEXTROSE 2-4 GM/100ML-% IV SOLN
2.0000 g | INTRAVENOUS | Status: DC
  Filled 2021-05-11: qty 100

## 2021-05-11 MED ORDER — CEFAZOLIN SODIUM-DEXTROSE 1-4 GM/50ML-% IV SOLN: 1.0000 g | INTRAVENOUS | Status: DC

## 2021-05-11 MED ORDER — SODIUM CHLORIDE 0.9 % IV SOLN: INTRAVENOUS | Status: DC

## 2021-05-11 MED ORDER — LIDOCAINE HCL 1 % IJ SOLN
INTRAMUSCULAR | Status: AC
  Filled 2021-05-11: qty 20

## 2021-05-11 NOTE — Progress Notes (Addendum)
Pt receives out-pt HD at [facility] on TTS. Pt arrives at 5:10 for 5:40 chair time. Clinic aware that pt is requesting to receive treatments at [facility] clinic while staying with sister. Navigator left message for Janett Billow, Quarry manager, at [facility] this am. Awaiting a return call.   Melven Sartorius Renal Navigator [phone]  Addendum at 3:41pm: Spoke to Janett Billow at [facility] who states clinic would be able to accept pt as a transient pt at d/c. Spoke to staff at [facility] who is going to re-clip pt to [facility]. Will await response from clinic regarding schedule at d/c. Spoke to pt via phone to provide update.

## 2021-05-11 NOTE — Progress Notes (Signed)
Patient ID: Paul [name], male   DOB: 1990/05/31, 30 y.o.   MRN: [id number] S: Feels well O:BP 134/90 (BP Location: Left Arm)    Pulse 96    Temp 98 F (36.7 C) (Oral)    Resp 16    Ht 6\' 1"  (1.854 m)    Wt (!) 144.7 kg    SpO2 100%    BMI 42.09 kg/m  No intake or output data in the 24 hours ending 05/11/21 0849 Intake/Output: No intake/output data recorded.  Intake/Output this shift:  No intake/output data recorded. Weight change:  Gen:NAD CVS: RRR Resp:CTA Abd: +BS, soft, NT/ND Ext: no edema  Recent Labs  Lab 05/06/21 1600 05/07/21 0312 05/08/21 0621  NA 134* 132* 134*  K 3.8 3.2* 3.7  CL 92* 94* 93*  CO2 25 23 22   GLUCOSE 163* 158* 109*  BUN 30* 32* 47*  CREATININE 11.66* 13.15* 16.36*  ALBUMIN 4.3  --   --   CALCIUM 9.7 9.2 8.8*  AST 21  --   --   ALT 12  --   --    Liver Function Tests: Recent Labs  Lab 05/06/21 1600  AST 21  ALT 12  ALKPHOS 69  BILITOT 0.7  PROT 9.8*  ALBUMIN 4.3   No results for input(s): LIPASE, AMYLASE in the last 168 hours. No results for input(s): AMMONIA in the last 168 hours. CBC: Recent Labs  Lab 05/06/21 1600 05/07/21 0312 05/08/21 0621 05/09/21 0232 05/10/21 0331  WBC 19.5* 18.2* 19.7* 12.6* 9.7  NEUTROABS 15.0*  --   --   --   --   HGB 14.3 11.9* 11.9* 11.4* 12.0*  HCT 43.0 36.8* 35.9* 35.9* 37.0*  MCV 84.0 84.0 83.7 84.3 84.3  PLT 226 202 237 230 299   Cardiac Enzymes: No results for input(s): CKTOTAL, CKMB, CKMBINDEX, TROPONINI in the last 168 hours. CBG: Recent Labs  Lab 05/10/21 0809 05/10/21 1131 05/10/21 1701 05/10/21 2030 05/11/21 0645  GLUCAP 260* 123* 194* 126* 122*    Iron Studies: No results for input(s): IRON, TIBC, TRANSFERRIN, FERRITIN in the last 72 hours. Studies/Results: ECHOCARDIOGRAM COMPLETE  Result Date: 05/09/2021    ECHOCARDIOGRAM REPORT   Patient Name:   VINICIUS BROCKMAN Date of Exam: 05/09/2021 Medical Rec #:  [id number]     Height:       73.0 in Accession #:    [id number]    Weight:        319.0 lb Date of Birth:  July 03, 1990      BSA:          2.624 m Patient Age:    30 years      BP:           105/55 mmHg Patient Gender: M             HR:           97 bpm. Exam Location:  Inpatient Procedure: 2D Echo, Color Doppler and Cardiac Doppler Indications:    Bacteremia  History:        Patient has no prior history of Echocardiogram examinations.                 Risk Factors:Diabetes and Hypertension.  Sonographer:    Jyl Heinz Referring Phys: [id number] [place]  1. Left ventricular ejection fraction, by estimation, is 60 to 65%. The left ventricle has normal function. The left ventricle has no regional wall motion abnormalities. Left ventricular diastolic parameters were normal.  2. Right ventricular systolic function is normal. The right ventricular size is normal.  3. The mitral valve is normal in structure. No evidence of mitral valve regurgitation. No evidence of mitral stenosis.  4. The aortic valve is normal in structure. Aortic valve regurgitation is not visualized. No aortic stenosis is present.  5. The inferior vena cava is normal in size with greater than 50% respiratory variability, suggesting right atrial pressure of 3 mmHg. Conclusion(s)/Recommendation(s): No evidence of valvular vegetations on this transthoracic echocardiogram. Consider a transesophageal echocardiogram to exclude infective endocarditis if clinically indicated. FINDINGS  Left Ventricle: Left ventricular ejection fraction, by estimation, is 60 to 65%. The left ventricle has normal function. The left ventricle has no regional wall motion abnormalities. The left ventricular internal cavity size was normal in size. There is  no left ventricular hypertrophy. Left ventricular diastolic parameters were normal. Right Ventricle: The right ventricular size is normal. No increase in right ventricular wall thickness. Right ventricular systolic function is normal. Left Atrium: Left atrial size was normal in size.  Right Atrium: Right atrial size was normal in size. Pericardium: There is no evidence of pericardial effusion. Mitral Valve: The mitral valve is normal in structure. No evidence of mitral valve regurgitation. No evidence of mitral valve stenosis. Tricuspid Valve: The tricuspid valve is normal in structure. Tricuspid valve regurgitation is not demonstrated. No evidence of tricuspid stenosis. Aortic Valve: The aortic valve is normal in structure. Aortic valve regurgitation is not visualized. No aortic stenosis is present. Aortic valve peak gradient measures 7.2 mmHg. Pulmonic Valve: The pulmonic valve was normal in structure. Pulmonic valve regurgitation is not visualized. No evidence of pulmonic stenosis. Aorta: The aortic root is normal in size and structure. Venous: The inferior vena cava is normal in size with greater than 50% respiratory variability, suggesting right atrial pressure of 3 mmHg. IAS/Shunts: No atrial level shunt detected by color flow Doppler.  LEFT VENTRICLE PLAX 2D LVIDd:         4.10 cm      Diastology LVIDs:         2.90 cm      LV e' medial:    7.94 cm/s LV PW:         1.20 cm      LV E/e' medial:  11.0 LV IVS:        1.10 cm      LV e' lateral:   10.60 cm/s LVOT diam:     2.20 cm      LV E/e' lateral: 8.2 LV SV:         78 LV SV Index:   30 LVOT Area:     3.80 cm  LV Volumes (MOD) LV vol d, MOD A2C: 127.0 ml LV vol d, MOD A4C: 140.0 ml LV vol s, MOD A2C: 47.8 ml LV vol s, MOD A4C: 52.4 ml LV SV MOD A2C:     79.2 ml LV SV MOD A4C:     140.0 ml LV SV MOD BP:      84.8 ml RIGHT VENTRICLE             IVC RV Basal diam:  3.20 cm     IVC diam: 1.20 cm RV Mid diam:    2.00 cm RV S prime:     15.50 cm/s TAPSE (M-mode): 2.0 cm LEFT ATRIUM             Index        RIGHT ATRIUM  Index LA diam:        3.60 cm 1.37 cm/m   RA Area:     13.80 cm LA Vol (A2C):   40.6 ml 15.47 ml/m  RA Volume:   34.10 ml  13.00 ml/m LA Vol (A4C):   38.0 ml 14.48 ml/m LA Biplane Vol: 39.6 ml 15.09 ml/m  AORTIC  VALVE AV Area (Vmax): 3.18 cm AV Vmax:        134.00 cm/s AV Peak Grad:   7.2 mmHg LVOT Vmax:      112.00 cm/s LVOT Vmean:     87.500 cm/s LVOT VTI:       0.205 m  AORTA Ao Root diam: 3.20 cm Ao Asc diam:  3.60 cm MITRAL VALVE MV Area (PHT): 4.89 cm    SHUNTS MV Decel Time: 155 msec    Systemic VTI:  0.20 m MV E velocity: 87.00 cm/s  Systemic Diam: 2.20 cm MV A velocity: 64.00 cm/s MV E/A ratio:  1.36 Mihai Croitoru MD Electronically signed by Sanda Klein MD Signature Date/Time: 05/09/2021/2:08:43 PM    Final     amoxicillin-clavulanate  1 tablet Oral QHS   Chlorhexidine Gluconate Cloth  6 each Topical Q0600   cinacalcet  60 mg Oral QODAY   ferric citrate  630 mg Oral TID WC   gabapentin  100 mg Oral TID   heparin  5,000 Units Subcutaneous Q8H   insulin aspart  0-6 Units Subcutaneous TID WC & HS   insulin glargine-yfgn  5 Units Subcutaneous Daily   levothyroxine  75 mcg Oral QAC breakfast   melatonin  3 mg Oral QHS   midodrine  5 mg Oral TID WC   multivitamin  1 tablet Oral QPM   rosuvastatin  20 mg Oral Daily   sodium chloride flush  3 mL Intravenous Q12H    BMET    Component Value Date/Time   NA 134 (L) 05/08/2021 0621   NA 139 06/14/2019 0914   K 3.7 05/08/2021 0621   CL 93 (L) 05/08/2021 0621   CO2 22 05/08/2021 0621   GLUCOSE 109 (H) 05/08/2021 0621   BUN 47 (H) 05/08/2021 0621   BUN 31 (H) 06/14/2019 0914   CREATININE 16.36 (H) 05/08/2021 0621   CREATININE 1.85 (H) 11/28/2014 1153   CALCIUM 8.8 (L) 05/08/2021 0621   GFRNONAA 4 (L) 05/08/2021 0621   GFRNONAA 51 (L) 11/28/2014 1153   GFRAA 6 (L) 06/14/2019 0914   GFRAA 58 (L) 11/28/2014 1153   CBC    Component Value Date/Time   WBC 9.7 05/10/2021 0331   RBC 4.39 05/10/2021 0331   HGB 12.0 (L) 05/10/2021 0331   HCT 37.0 (L) 05/10/2021 0331   PLT 299 05/10/2021 0331   MCV 84.3 05/10/2021 0331   MCH 27.3 05/10/2021 0331   MCHC 32.4 05/10/2021 0331   RDW 17.2 (H) 05/10/2021 0331   LYMPHSABS 2.4 05/06/2021 1600    MONOABS 1.7 (H) 05/06/2021 1600   EOSABS 0.1 05/06/2021 1600   BASOSABS 0.1 05/06/2021 1600     [facility]   4.5h   500/1.5   145kg  2/2 bath P2 TDC  Hep 8000  - hect 5ug tiw  - no esa   Assessment/Plan: Sepsis/ L foot osteo - w/ ^WBC, hypotension. 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  WBC improving down to 9.7.   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 [name], per  ID will need a line holiday.   Will consult IR to remove [facility] today after HD then replace on Thursday. ESRD - on HD TTS. Continue on schedule. Next HD 12/20 Plans to go to sister's house in [place] after discharge --will need transient placement at [facility]. Contact renal navigator for assistance on Monday.  HD access - on HD 3 yrs. 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 Supposed to see [facility] soon about next access. Using [facility] now.  HTN - BP's soft likely due to infection. Better today.  DM2 - on insulin.  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 Cont sensipar and [place].  Anemia ckd - Hb >10, no esa at OP unit. Follow.     Donetta Potts, MD [employer] [phone]

## 2021-05-11 NOTE — Progress Notes (Signed)
° ° °  [facility] has been requested to perform a transesophageal echocardiogram on 12/21 for bacteremia.  After careful review of history and examination, the risks and benefits of transesophageal echocardiogram have been explained including risks of esophageal damage, perforation (1:10,000 risk), bleeding, pharyngeal hematoma as well as other potential complications associated with conscious sedation including aspiration, arrhythmia, respiratory failure and death. Alternatives to treatment were discussed, questions were answered. Patient is willing to proceed.   Rosaria Ferries, PA-C 05/11/2021 2:11 PM

## 2021-05-11 NOTE — Progress Notes (Signed)
PROGRESS NOTE    Paul [name]  OVZ:[id number] DOB: 05/13/1991 DOA: 05/06/2021 PCP: Paul Brill, DO   Chief Complaint  Patient presents with   Wound Infection   Brief Narrative/Hospital Course: Paul [name], 30 y.o. male with PMH of ESRD and HD TTS, T2DM, morbid obesity bmi 43, HTN, HLD, anemia of chronic disease, hypothyroidism presented with wound on the left great toe along with redness, pain initially started as a blister and ulcer on the bottom of the left great toe few weeks ago and got worse in last 2 to 3 days. He was seen in the ED systolic blood pressure insulin 80s [name] IV fluid boluses CT scan of the fluid no periosteal reaction MRI recommended, also had leukocytosis lactic acidosis abnormal LFTs and concern about severe sepsis PCCM was consulted and after discussion transferred under hospitalist for admission for severe sepsis. 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 Vital stable No new complaints.  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 Culture polymicrobial, blood culture MSSA repeat blood culture negative, planning for line holiday after dialysis today.  TTE unremarkable planning TEE Wednesday.  Continue cefazolin IV.  Also amatory/Paul [name] x5 days for "mop up" of polymicrobial infection Recent Labs  Lab 05/06/21 1600 05/06/21 1740 05/07/21 0312 05/07/21 0610 05/08/21 0621 05/09/21 0232 05/10/21 0331 05/11/21 0824  WBC 19.5*  --  18.2*  --  19.7* 12.6* 9.7 10.0  LATICACIDVEN 2.3* 2.0* 1.0 1.2  --   --   --   --      T2DM on insulin with CKD and diabetic neuropathy c well-controlled,  continue SSI, Lantus. Stable HbA1c 6 , started on Neurontin for neuropathy-will need Rx. Recent Labs  Lab 05/10/21 0809 05/10/21 1131 05/10/21 1701 05/10/21 2030 05/11/21 0645  GLUCAP 260* 123* 194* 126* 122*    HTN: Controlled.  Was initiated on midodrine-we will discontinue after today's dose, home meds on hold HLD: Continue statin ESRD and HD TTS: Nephrology on board for HD today then line holiday-nephrology  to consult IR Anemia of chronic disease: stabe, monitor hemoglobin Recent Labs  Lab 05/07/21 0312 05/08/21 0621 05/09/21 0232 05/10/21 0331 05/11/21 0824  HGB 11.9* 11.9* 11.4* 12.0* 11.3*  HCT 36.8* 35.9* 35.9* 41.2* 87.8*  Metabolic bone disease: cont home meds  Hypokalemia-resolved Hypothyroidism: cont Synthroid Class III Obesity:Patient's Body mass index is 43.92 kg/m. : Will benefit with PCP follow-up, weight loss  healthy lifestyle and outpatient sleep evaluation.  DVT prophylaxis: heparin injection 5,000 Units Start: 05/07/21 1400 Code Status:   Code Status: Full Code Family Communication: plan of care discussed with patient at bedside. Status is: Inpatient Remains inpatient appropriate because: For IV antibiotics and management of osteomyelitis Disposition: Currently not medically stable for discharge. Anticipated Disposition: To sisters home in [place] patient requesting dialysis as per the [facility] while he is at sister's house- SW informed.  Objective: Vitals last 24 hrs: Vitals:   05/11/21 0842 05/11/21 0900 05/11/21 0930 05/11/21 1000  BP: (!) 142/77 137/80 133/71 130/78  Pulse: 86 85 82 86  Resp:      Temp:      TempSrc:      SpO2:  Weight:      Height:       Weight change:  No intake or output data in the 24 hours ending 05/11/21 1023  Net IO Since Admission: 1,452.94 mL [05/11/21 1023]   Physical Examination: General exam: AAOx 3, pleasant, obese HEENT:Oral mucosa moist, Ear/Nose WNL grossly, dentition  normal. Respiratory system: bilaterally clear, no use of accessory muscle Cardiovascular system: S1 & S2 +, No JVD,. Gastrointestinal system: Abdomen soft, NT,ND, BS+ Nervous System:Alert, awake, moving extremities and grossly nonfocal Extremities: No edema, LLE wound with dressing in place Skin: No rashes,no icterus. MSK: Normal muscle bulk,tone, power   Medications reviewed:  Scheduled Meds:  amoxicillin-clavulanate  1 tablet Oral QHS   Chlorhexidine Gluconate Cloth  6 each Topical Q0600   cinacalcet  60 mg Oral QODAY   ferric citrate  630 mg Oral TID WC   gabapentin  100 mg Oral TID   heparin  5,000 Units Subcutaneous Q8H   insulin aspart  0-6 Units Subcutaneous TID WC & HS   insulin glargine-yfgn  5 Units Subcutaneous Daily   levothyroxine  75 mcg Oral QAC breakfast   melatonin  3 mg Oral QHS   midodrine  5 mg Oral TID WC   multivitamin  1 tablet Oral QPM   rosuvastatin  20 mg Oral Daily   sodium chloride flush  3 mL Intravenous Q12H  Continuous Infusions:  sodium chloride      ceFAZolin (ANCEF) IV 2 g (05/08/21 1856)   Diet Order             Diet renal/carb modified with fluid restriction Diet-HS Snack? Nothing; Fluid restriction: 1200 mL Fluid; Room service appropriate? Yes; Fluid consistency: Thin  Diet effective now                 Weight change:   Wt Readings from Last 3 Encounters:  05/11/21 (!) 151 kg  03/26/21 (!) 149.7 kg  03/17/21 (!) 151 kg  Consultants:see note  Procedures:see note Antimicrobials: Anti-infectives (From admission, onward)    Start     Dose/Rate Route Frequency Ordered Stop   05/10/21 2200  amoxicillin-clavulanate (AUGMENTIN) 500-125 MG per tablet 500 mg        1 tablet Oral Daily at bedtime 05/10/21 1529 05/15/21 2159   05/08/21 1800  ceFAZolin (ANCEF) IVPB 2g/100 mL premix        2 g 200 mL/hr over 30 Minutes Intravenous Every T-Th-Sa (1800) 05/07/21 1239     05/08/21 1600  amoxicillin (AMOXIL) capsule 500 mg  Status:   Discontinued        500 mg Oral Every 24 hours 05/08/21 1535 05/10/21 1522   05/08/21 1200  vancomycin (VANCOCIN) IVPB 1000 mg/200 mL premix  Status:  Discontinued        1,000 mg 200 mL/hr over 60 Minutes Intravenous Every T-Th-Sa (Hemodialysis) 05/07/21 0233 05/07/21 1239   05/08/21 0600  ceFAZolin (ANCEF) IVPB 3g/100 mL premix        3 g 200 mL/hr over 30 Minutes Intravenous On call to O.R. 05/07/21 1857 05/08/21 0755   05/07/21 1530  ceFAZolin (ANCEF) IVPB 2g/100 mL premix  Status:  Discontinued        2 g 200 mL/hr over 30 Minutes Intravenous To Radiology 05/07/21 1436 05/07/21 1615   05/06/21 1700  vancomycin (VANCOCIN) IVPB 1000 mg/200 mL premix        1,000 mg 200 mL/hr over 60 Minutes Intravenous Every 1 hr x 2 05/06/21 1621 05/06/21 2005  05/06/21 1630  ceFEPIme (MAXIPIME) 2 g in sodium chloride 0.9 % 100 mL IVPB        2 g 200 mL/hr over 30 Minutes Intravenous  Once 05/06/21 1621 05/06/21 1701   05/06/21 1630  metroNIDAZOLE (FLAGYL) IVPB 500 mg        500 mg 100 mL/hr over 60 Minutes Intravenous  Once 05/06/21 1621 05/06/21 1728   05/06/21 1615  piperacillin-tazobactam (ZOSYN) IVPB 2.25 g  Status:  Discontinued        2.25 g 100 mL/hr over 30 Minutes Intravenous  Once 05/06/21 1614 05/06/21 1621      Culture/Microbiology    Component Value Date/Time   SDES TISSUE 05/08/2021 0810   SPECREQUEST TIS FROM GREAT LEFT TOE SPEC A 05/08/2021 0810   CULT  05/08/2021 0810    MODERATE STAPHYLOCOCCUS AUREUS NO ANAEROBES ISOLATED; CULTURE IN PROGRESS FOR 5 DAYS    REPTSTATUS PENDING 05/08/2021 0810    Other culture-see note  Unresulted Labs (From admission, onward)     Start     Ordered   05/12/21 0500  CBC  Tomorrow morning,   R       Question:  Specimen collection method  Answer:  Lab=Lab collect   05/11/21 0711   05/12/21 9509  Basic metabolic panel  Tomorrow morning,   R       Question:  Specimen collection method  Answer:  Lab=Lab collect   05/11/21 0711   05/06/21  1602  Urinalysis, Routine w reflex microscopic  (Septic presentation on arrival (screening labs, nursing and treatment orders for obvious sepsis))  ONCE - STAT,   STAT        05/06/21 1605   Signed and Held  Renal function panel  Once,   R       Question:  Specimen collection method  Answer:  Lab=Lab collect   Signed and Held   Signed and Held  CBC  Once,   R       Question:  Specimen collection method  Answer:  Lab=Lab collect   Signed and Held          Data Reviewed: I have personally reviewed following labs and imaging studies CBC: Recent Labs  Lab 05/06/21 1600 05/07/21 0312 05/08/21 0621 05/09/21 0232 05/10/21 0331 05/11/21 0824  WBC 19.5* 18.2* 19.7* 12.6* 9.7 10.0  NEUTROABS 15.0*  --   --   --   --   --   HGB 14.3 11.9* 11.9* 11.4* 12.0* 11.3*  HCT 43.0 36.8* 35.9* 35.9* 37.0* 35.0*  MCV 84.0 84.0 83.7 84.3 84.3 83.7  PLT 226 202 237 230 299 326   Basic Metabolic Panel: Recent Labs  Lab 05/06/21 1600 05/07/21 0312 05/08/21 0621 05/11/21 0824  NA 134* 132* 134* 133*  K 3.8 3.2* 3.7 3.9  CL 92* 94* 93* 97*  CO2 25 23 22 22   GLUCOSE 163* 158* 109* 199*  BUN 30* 32* 47* 63*  CREATININE 11.66* 13.15* 16.36* 16.25*  CALCIUM 9.7 9.2 8.8* 8.9  PHOS  --   --   --  4.0   GFR: Estimated Creatinine Clearance: 10.4 mL/min (A) (by C-G formula based on SCr of 16.25 mg/dL (H)). Liver Function Tests: Recent Labs  Lab 05/06/21 1600 05/11/21 0824  AST 21  --   ALT 12  --   ALKPHOS 69  --   BILITOT 0.7  --   PROT 9.8*  --   ALBUMIN 4.3 2.9*   No results for input(s): LIPASE,  AMYLASE in the last 168 hours. No results for input(s): AMMONIA in the last 168 hours. Coagulation Profile: Recent Labs  Lab 05/06/21 1600  INR 1.3*   Cardiac Enzymes: No results for input(s): CKTOTAL, CKMB, CKMBINDEX, TROPONINI in the last 168 hours. BNP (last 3 results) No results for input(s): PROBNP in the last 8760 hours. HbA1C: No results for input(s): HGBA1C in the last 72  hours.  CBG: Recent Labs  Lab 05/10/21 0809 05/10/21 1131 05/10/21 1701 05/10/21 2030 05/11/21 0645  GLUCAP 260* 123* 194* 126* 122*   Lipid Profile: No results for input(s): CHOL, HDL, LDLCALC, TRIG, CHOLHDL, LDLDIRECT in the last 72 hours. Thyroid Function Tests: No results for input(s): TSH, T4TOTAL, FREET4, T3FREE, THYROIDAB in the last 72 hours. Anemia Panel: No results for input(s): VITAMINB12, FOLATE, FERRITIN, TIBC, IRON, RETICCTPCT in the last 72 hours. Sepsis Labs: Recent Labs  Lab 05/06/21 1600 05/06/21 1740 05/07/21 0312 05/07/21 0610  LATICACIDVEN 2.3* 2.0* 1.0 1.2    Recent Results (from the past 240 hour(s))  Resp Panel by RT-PCR (Flu A&B, Covid) Nasopharyngeal Swab     Status: None   Collection Time: 05/06/21  3:37 PM   Specimen: Nasopharyngeal Swab; Nasopharyngeal(NP) swabs in vial transport medium  Result Value Ref Range Status   SARS Coronavirus 2 by RT PCR NEGATIVE NEGATIVE Final    Comment: (NOTE) SARS-CoV-2 target nucleic acids are NOT DETECTED.  The SARS-CoV-2 RNA is generally detectable in upper respiratory specimens during the acute phase of infection. The lowest concentration of SARS-CoV-2 viral copies this assay can detect is 138 copies/mL. A negative result does not preclude SARS-Cov-2 infection and should not be used as the sole basis for treatment or other patient management decisions. A negative result may occur with  improper specimen collection/handling, submission of specimen other than nasopharyngeal swab, presence of viral mutation(s) within the areas targeted by this assay, and inadequate number of viral copies(<138 copies/mL). A negative result must be combined with clinical observations, patient history, and epidemiological information. The expected result is Negative.  Fact Sheet for Patients:  EntrepreneurPulse.com.au  Fact Sheet for Healthcare Providers:  IncredibleEmployment.be  This  test is no t yet approved or cleared by the [place] FDA and  has been authorized for detection and/or diagnosis of SARS-CoV-2 by FDA under an Emergency Use Authorization (EUA). This EUA will remain  in effect (meaning this test can be used) for the duration of the COVID-19 declaration under Section 564(b)(1) of the Act, 21 U.S.C.section 360bbb-3(b)(1), unless the authorization is terminated  or revoked sooner.       Influenza A by PCR NEGATIVE NEGATIVE Final   Influenza B by PCR NEGATIVE NEGATIVE Final    Comment: (NOTE) The Xpert Xpress SARS-CoV-2/FLU/RSV plus assay is intended as an aid in the diagnosis of influenza from Nasopharyngeal swab specimens and should not be used as a sole basis for treatment. Nasal washings and aspirates are unacceptable for Xpert Xpress SARS-CoV-2/FLU/RSV testing.  Fact Sheet for Patients: EntrepreneurPulse.com.au  Fact Sheet for Healthcare Providers: IncredibleEmployment.be  This test is not yet approved or cleared by the [place] FDA and has been authorized for detection and/or diagnosis of SARS-CoV-2 by FDA under an Emergency Use Authorization (EUA). This EUA will remain in effect (meaning this test can be used) for the duration of the COVID-19 declaration under Section 564(b)(1) of the Act, 21 U.S.C. section 360bbb-3(b)(1), unless the authorization is terminated or revoked.  Performed at [facility], [address] [place],  [place] [address]   Culture, blood (x 2)     Status: Abnormal   Collection Time: 05/06/21  4:00 PM   Specimen: BLOOD  Result Value Ref Range Status   Specimen Description   Final    BLOOD BLOOD RIGHT HAND Performed at [facility], [address] [place], [place] [address]    Special Requests   Final    BOTTLES DRAWN AEROBIC ONLY Blood Culture results may not be optimal due to an inadequate volume of blood received in culture bottles Performed at  [facility], [address] [place], [place] [address]    Culture  Setup Time   Final    GRAM POSITIVE COCCI AEROBIC BOTTLE ONLY CRITICAL RESULT CALLED TO, READ BACK BY AND VERIFIED WITH: PHARMD GREG ABBOTT 05/08/21@4 :17 BY TW    Culture (A)  Final    STAPHYLOCOCCUS AUREUS SUSCEPTIBILITIES PERFORMED ON PREVIOUS CULTURE WITHIN THE LAST 5 DAYS. Performed at [facility], [place] [address] [place], [place] [address]    Report Status 05/09/2021 FINAL  Final  Blood Culture ID Panel (Reflexed)     Status: Abnormal   Collection Time: 05/06/21  4:00 PM  Result Value Ref Range Status   Enterococcus faecalis NOT DETECTED NOT DETECTED Final   Enterococcus Faecium NOT DETECTED NOT DETECTED Final   Listeria monocytogenes NOT DETECTED NOT DETECTED Final   Staphylococcus species DETECTED (A) NOT DETECTED Final    Comment: CRITICAL RESULT CALLED TO, READ BACK BY AND VERIFIED WITH: PHARMD GREG ABBOTT 05/08/21@4 :17 BY TW    Staphylococcus aureus (BCID) DETECTED (A) NOT DETECTED Final    Comment: CRITICAL RESULT CALLED TO, READ BACK BY AND VERIFIED WITH: PHARMD GREG ABBOTT 05/08/21@4 :17 BY TW    Staphylococcus epidermidis NOT DETECTED NOT DETECTED Final   Staphylococcus lugdunensis NOT DETECTED NOT DETECTED Final   Streptococcus species NOT DETECTED NOT DETECTED Final   Streptococcus agalactiae NOT DETECTED NOT DETECTED Final   Streptococcus pneumoniae NOT DETECTED NOT DETECTED Final   Streptococcus pyogenes NOT DETECTED NOT DETECTED Final   A.calcoaceticus-baumannii NOT DETECTED NOT DETECTED Final   Bacteroides fragilis NOT DETECTED NOT DETECTED Final   Enterobacterales NOT DETECTED NOT DETECTED Final   Enterobacter cloacae complex NOT DETECTED NOT DETECTED Final   Escherichia coli NOT DETECTED NOT DETECTED Final   Klebsiella aerogenes NOT DETECTED NOT DETECTED Final   Klebsiella oxytoca NOT DETECTED NOT DETECTED Final   Klebsiella pneumoniae NOT DETECTED NOT DETECTED Final    Proteus species NOT DETECTED NOT DETECTED Final   Salmonella species NOT DETECTED NOT DETECTED Final   Serratia marcescens NOT DETECTED NOT DETECTED Final   Haemophilus influenzae NOT DETECTED NOT DETECTED Final   Neisseria meningitidis NOT DETECTED NOT DETECTED Final   Pseudomonas aeruginosa NOT DETECTED NOT DETECTED Final   Stenotrophomonas maltophilia NOT DETECTED NOT DETECTED Final   Candida albicans NOT DETECTED NOT DETECTED Final   Candida auris NOT DETECTED NOT DETECTED Final   Candida glabrata NOT DETECTED NOT DETECTED Final   Candida krusei NOT DETECTED NOT DETECTED Final   Candida parapsilosis NOT DETECTED NOT DETECTED Final   Candida tropicalis NOT DETECTED NOT DETECTED Final   Cryptococcus neoformans/gattii NOT DETECTED NOT DETECTED Final   Meth resistant mecA/C and MREJ NOT DETECTED NOT DETECTED Final    Comment: Performed at [facility], [address] [place], [place] [address]  Culture, blood (x 2)     Status: Abnormal   Collection Time: 05/06/21  4:15  PM   Specimen: BLOOD  Result Value Ref Range Status   Specimen Description   Final    BLOOD BLOOD RIGHT HAND Performed at [facility], [address] [place], [place] [address]    Special Requests   Final    BOTTLES DRAWN AEROBIC AND ANAEROBIC Blood Culture adequate volume Performed at [facility], [address] [place], [place] [address]    Culture  Setup Time   Final    GRAM POSITIVE COCCI IN CLUSTERS IN BOTH AEROBIC AND ANAEROBIC BOTTLES CRITICAL RESULT CALLED TO, READ BACK BY AND VERIFIED WITH: PHARMD J.FRENS AT 1017 ON 05/07/2021 BY T.SAAD. Performed at [facility], [place] [address] [place], [place] [address]    Culture STAPHYLOCOCCUS AUREUS (A)  Final   Report Status 05/09/2021 FINAL  Final   Organism ID, Bacteria STAPHYLOCOCCUS AUREUS  Final      Susceptibility   Staphylococcus aureus - MIC*    CIPROFLOXACIN >=8 RESISTANT Resistant     ERYTHROMYCIN <=0.25 SENSITIVE  Sensitive     GENTAMICIN <=0.5 SENSITIVE Sensitive     OXACILLIN <=0.25 SENSITIVE Sensitive     TETRACYCLINE <=1 SENSITIVE Sensitive     VANCOMYCIN 1 SENSITIVE Sensitive     TRIMETH/SULFA <=10 SENSITIVE Sensitive     CLINDAMYCIN <=0.25 SENSITIVE Sensitive     RIFAMPIN <=0.5 SENSITIVE Sensitive     Inducible Clindamycin NEGATIVE Sensitive     * STAPHYLOCOCCUS AUREUS  Blood Culture ID Panel (Reflexed)     Status: Abnormal   Collection Time: 05/06/21  4:15 PM  Result Value Ref Range Status   Enterococcus faecalis NOT DETECTED NOT DETECTED Final   Enterococcus Faecium NOT DETECTED NOT DETECTED Final   Listeria monocytogenes NOT DETECTED NOT DETECTED Final   Staphylococcus species DETECTED (A) NOT DETECTED Final    Comment: CRITICAL RESULT CALLED TO, READ BACK BY AND VERIFIED WITH: PHARMD J.FRENS AT 8527 ON 05/07/2021 BY T.SAAD.    Staphylococcus aureus (BCID) DETECTED (A) NOT DETECTED Final    Comment: CRITICAL RESULT CALLED TO, READ BACK BY AND VERIFIED WITH: PHARMD J.FRENS AT 7824 ON 05/07/2021 BY T.SAAD.    Staphylococcus epidermidis NOT DETECTED NOT DETECTED Final   Staphylococcus lugdunensis NOT DETECTED NOT DETECTED Final   Streptococcus species NOT DETECTED NOT DETECTED Final   Streptococcus agalactiae NOT DETECTED NOT DETECTED Final   Streptococcus pneumoniae NOT DETECTED NOT DETECTED Final   Streptococcus pyogenes NOT DETECTED NOT DETECTED Final   A.calcoaceticus-baumannii NOT DETECTED NOT DETECTED Final   Bacteroides fragilis NOT DETECTED NOT DETECTED Final   Enterobacterales NOT DETECTED NOT DETECTED Final   Enterobacter cloacae complex NOT DETECTED NOT DETECTED Final   Escherichia coli NOT DETECTED NOT DETECTED Final   Klebsiella aerogenes NOT DETECTED NOT DETECTED Final   Klebsiella oxytoca NOT DETECTED NOT DETECTED Final   Klebsiella pneumoniae NOT DETECTED NOT DETECTED Final   Proteus species NOT DETECTED NOT DETECTED Final   Salmonella species NOT DETECTED NOT  DETECTED Final   Serratia marcescens NOT DETECTED NOT DETECTED Final   Haemophilus influenzae NOT DETECTED NOT DETECTED Final   Neisseria meningitidis NOT DETECTED NOT DETECTED Final   Pseudomonas aeruginosa NOT DETECTED NOT DETECTED Final   Stenotrophomonas maltophilia NOT DETECTED NOT DETECTED Final   Candida albicans NOT DETECTED NOT DETECTED Final   Candida auris NOT DETECTED NOT DETECTED Final   Candida glabrata NOT DETECTED NOT DETECTED Final   Candida krusei NOT DETECTED NOT DETECTED Final   Candida parapsilosis  NOT DETECTED NOT DETECTED Final   Candida tropicalis NOT DETECTED NOT DETECTED Final   Cryptococcus neoformans/gattii NOT DETECTED NOT DETECTED Final   Meth resistant mecA/C and MREJ NOT DETECTED NOT DETECTED Final    Comment: Performed at [facility], [place] [address], [place], [place] [address]  Aerobic/Anaerobic Culture w Gram Stain (surgical/deep wound)     Status: None   Collection Time: 05/06/21  7:47 PM   Specimen: Toe; Wound  Result Value Ref Range Status   Specimen Description   Final    TOE Performed at [facility], [address] [place], [place] [address]    Special Requests   Final    Immunocompromised Performed at [facility], [address] [place], [place] [address]    Gram Stain   Final    NO WBC SEEN MODERATE GRAM POSITIVE COCCI IN PAIRS    Culture   Final    ABUNDANT STAPHYLOCOCCUS AUREUS MODERATE ENTEROCOCCUS FAECALIS ABUNDANT BACTEROIDES FRAGILIS BETA LACTAMASE POSITIVE Performed at [facility], [place] [address], [place], [place] [address]    Report Status 05/10/2021 FINAL  Final   Organism ID, Bacteria STAPHYLOCOCCUS AUREUS  Final   Organism ID, Bacteria ENTEROCOCCUS FAECALIS  Final      Susceptibility   Enterococcus faecalis - MIC*    AMPICILLIN <=2 SENSITIVE Sensitive     VANCOMYCIN 2 SENSITIVE Sensitive     GENTAMICIN SYNERGY SENSITIVE Sensitive     * MODERATE ENTEROCOCCUS FAECALIS    Staphylococcus aureus - MIC*    CIPROFLOXACIN >=8 RESISTANT Resistant     ERYTHROMYCIN <=0.25 SENSITIVE Sensitive     GENTAMICIN <=0.5 SENSITIVE Sensitive     OXACILLIN <=0.25 SENSITIVE Sensitive     TETRACYCLINE <=1 SENSITIVE Sensitive     VANCOMYCIN <=0.5 SENSITIVE Sensitive     TRIMETH/SULFA <=10 SENSITIVE Sensitive     CLINDAMYCIN <=0.25 SENSITIVE Sensitive     RIFAMPIN <=0.5 SENSITIVE Sensitive     Inducible Clindamycin NEGATIVE Sensitive     * ABUNDANT STAPHYLOCOCCUS AUREUS  MRSA Next Gen by PCR, Nasal     Status: None   Collection Time: 05/08/21  2:17 AM   Specimen: Nasal Mucosa; Nasal Swab  Result Value Ref Range Status   MRSA by PCR Next Gen NOT DETECTED NOT DETECTED Final    Comment: (NOTE) The GeneXpert MRSA Assay (FDA approved for NASAL specimens only), is one component of a comprehensive MRSA colonization surveillance program. It is not intended to diagnose MRSA infection nor to guide or monitor treatment for MRSA infections. Test performance is not FDA approved in patients less than 50 years old. Performed at [facility], [place] [address], [place], [place] [address]   Culture, blood (routine x 2)     Status: None (Preliminary result)   Collection Time: 05/08/21  6:11 AM   Specimen: BLOOD RIGHT HAND  Result Value Ref Range Status   Specimen Description BLOOD RIGHT HAND  Final   Special Requests   Final    BOTTLES DRAWN AEROBIC AND ANAEROBIC Blood Culture results may not be optimal due to an inadequate volume of blood received in culture bottles   Culture   Final    NO GROWTH 3 DAYS Performed at [facility], [place] [address], [place], [place] [address]    Report Status PENDING  Incomplete  Culture, blood (routine x 2)     Status: None (Preliminary result)   Collection Time: 05/08/21  6:26 AM   Specimen: BLOOD  Result Value Ref Range Status   Specimen Description BLOOD LEFT ANTECUBITAL  Final   Special Requests AEROBIC BOTTLE ONLY Blood Culture adequate  volume  Final   Culture   Final    NO GROWTH 3 DAYS Performed at [facility] [address], [place], [place] [address]    Report Status PENDING  Incomplete  Aerobic/Anaerobic Culture w Gram Stain (surgical/deep wound)     Status: None (Preliminary result)   Collection Time: 05/08/21  8:10 AM   Specimen: PATH Other; Tissue  Result Value Ref Range Status   Specimen Description TISSUE  Final   Special Requests TIS FROM GREAT LEFT TOE SPEC A  Final   Gram Stain   Final    FEW WBC PRESENT, PREDOMINANTLY PMN FEW GRAM POSITIVE COCCI Performed at [facility], [place] [address], [place], [place] [address]    Culture   Final    MODERATE STAPHYLOCOCCUS AUREUS NO ANAEROBES ISOLATED; CULTURE IN PROGRESS FOR 5 DAYS    Report Status PENDING  Incomplete   Organism ID, Bacteria STAPHYLOCOCCUS AUREUS  Final      Susceptibility   Staphylococcus aureus - MIC*    CIPROFLOXACIN >=8 RESISTANT Resistant     ERYTHROMYCIN <=0.25 SENSITIVE Sensitive     GENTAMICIN <=0.5 SENSITIVE Sensitive     OXACILLIN <=0.25 SENSITIVE Sensitive     TETRACYCLINE <=1 SENSITIVE Sensitive     VANCOMYCIN 1 SENSITIVE Sensitive     TRIMETH/SULFA <=10 SENSITIVE Sensitive     CLINDAMYCIN <=0.25 SENSITIVE Sensitive     RIFAMPIN <=0.5 SENSITIVE Sensitive     Inducible Clindamycin NEGATIVE Sensitive     * MODERATE STAPHYLOCOCCUS AUREUS     Radiology Studies: No results found.   LOS: 4 days   Antonieta Pert, MD [facility]  05/11/2021, 10:23 AM

## 2021-05-11 NOTE — Consult Note (Signed)
[facility] Inpatient Consult   05/11/2021  Paul [name] [birth year] [id number]  Managed Medicaid [MM[facility]  Primary Care Provider:  Orvis Brill, DO, [facility], an Embedded  Patient screened for hospitalization with  assess for potential Managed Medicaid Care Management service needs for post hospital transition. Met with the patient regarding post hospital follow up needs.  Explained how follow up with the MM team can assist in post hospital support.   Patient states he is trying to work on getting to his sister's in [place] but he would need to have transportation assistance and another facility for HD.  He is waiting for the Navigator and TOC social worker's follow up to him.  Plan:  Continue to follow progress and disposition to assess for post hospital care management needs.  Patient will be referred to MM team for ongoing care management needs.  For questions contact:   Natividad Brood, RN BSN [facility] Liaison  [phone] business mobile phone Toll free office [phone]  Fax number: [phone] [email or web address]

## 2021-05-11 NOTE — Consult Note (Signed)
Chief Complaint: Patient was seen in consultation today for  Chief Complaint  Patient presents with   Wound Infection    Referring Physician(s): Dr. Marval Regal  Supervising Physician: Sandi Mariscal  Patient Status: [facility] - In-pt  History of Present Illness: Paul [name] is a 30 y.o. male with a medical history significant for DM2, ESRD on dialysis, morbid obesity, and HTN. He presented to the ED with complaints of pain and redness to his left foot/first toe. He stated there had been a blister/ulcer for several weeks and it had gotten worse. He also had chills, nausea and subjective fevers. In the ED he was found to be hypotensive with leukocytosis and elevated lactic acid. Imaging of the left foot showed osteomyelitis and an abscess. 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   Paul [name] has a RIJ tunneled dialysis catheter - placed in IR 03/26/21. Interventional Radiology has been asked to remove this catheter for a line holiday and place a new HD catheter in a few days.   Past Medical History:  Diagnosis Date   Acute hypoxemic respiratory failure ([place]) 09/11/2016   Anemia    Chronic kidney disease    ARF on CRF Dialysis T/TH/Sa   Diabetes mellitus    Type II   End stage renal disease on dialysis ([place])    [place]    GERD (gastroesophageal reflux disease)    diet controlled   HLD (hyperlipidemia)    Hypertension    Hypothyroidism    Morbid obesity ([place])    Sacral wound    resolved   Thyroid disease     Past Surgical History:  Procedure Laterality Date   AMPUTATION Left 05/08/2021   Procedure: AMPUTATION GREAT TOE;  Surgeon: Newt Minion, MD;  Location: [place];  Service: Orthopedics;  Laterality: Left;   AV FISTULA PLACEMENT Left 09/26/2016   Procedure: LEFT UPPER ARM ARTERIOVENOUS (AV) FISTULA CREATION;  Surgeon: Elam Dutch, MD;  Location: [place];  Service: Vascular;  Laterality: Left;   AV  FISTULA PLACEMENT Left 12/02/2016   Procedure: INSERTION OF ARTERIOVENOUS GORE-TEX GRAFT LEFT UPPER  ARM USING A 4-7MM BY 45CM GRAFT ;  Surgeon: Rosetta Posner, MD;  Location: [place];  Service: Vascular;  Laterality: Left;   AV FISTULA PLACEMENT Right 07/03/2020   Procedure: INSERTION OF ARTERIOVENOUS (AV) GORE-TEX GRAFT RIGHT UPPER ARM;  Surgeon: Marty Heck, MD;  Location: [place];  Service: Vascular;  Laterality: Right;   [place] Right 05/06/2020   Procedure: FIRST STAGE RIGHT [place];  Surgeon: Serafina Mitchell, MD;  Location: [place];  Service: Vascular;  Laterality: Right;   EXCHANGE OF A DIALYSIS CATHETER Right 10/08/2016   Procedure: EXCHANGE OF A DIALYSIS CATHETER-RIGHT INTERNAL JUGULAR;  Surgeon: Conrad Heartwell, MD;  Location: [place];  Service: Vascular;  Laterality: Right;   INSERTION OF DIALYSIS CATHETER Right 09/26/2016   Procedure: INSERTION OF DIALYSIS CATHETER - Right Internal Jugular Placement;  Surgeon: Elam Dutch, MD;  Location: [place];  Service: Vascular;  Laterality: Right;   INSERTION OF DIALYSIS CATHETER Left 03/22/2018   Procedure: INSERTION OF DIALYSIS CATHETER;  Surgeon: Marty Heck, MD;  Location: [place];  Service: Vascular;  Laterality: Left;   IR FLUORO GUIDE CV LINE RIGHT  03/26/2021   IR US GUIDE VASC ACCESS RIGHT  03/26/2021   REVISON OF ARTERIOVENOUS FISTULA Left 03/22/2018   Procedure: LEFT UPPER EXTREMITY  ARTERIOVENOUS  REVISION WITH GORE-TEX GRAFT.;  Surgeon: Marty Heck, MD;  Location: [place];  Service: Vascular;  Laterality: Left;   THROMBECTOMY Left 08/26/2019   thrombectomy of LUA loop AVG   THROMBECTOMY AND REVISION OF ARTERIOVENTOUS (AV) GORETEX  GRAFT Right 07/27/2020   Procedure: INSERTION OF RIGHT ARM LOOP GRAFT WITH EXCISION OF RIGHT ARM BRACHIAL AXILLARY GRAFT;  Surgeon: Marty Heck, MD;  Location: [place];  Service: Vascular;  Laterality: Right;   VISCERAL ANGIOGRAPHY Right 06/17/2020    Procedure: CENTRAL VENO;  Surgeon: Cherre Robins, MD;  Location: [facility];  Service: Cardiovascular;  Laterality: Right;    Allergies: Hydralazine hcl  Medications: Prior to Admission medications   Medication Sig Start Date End Date Taking? Authorizing Provider  acetaminophen (TYLENOL) 500 MG tablet Take 1,000 mg by mouth every 6 (six) hours as needed (for pain.).   Yes [provider]  amLODipine (NORVASC) 5 MG tablet Take 5 mg on nondialysis days. Patient taking differently: Take 5 mg by mouth See admin instructions. Take one tablet (5 mg) on Sun, Mon, Wed, and Fri (non-dialysis days) 03/07/20  Yes Wilber Oliphant, MD  calcium carbonate (TUMS - DOSED IN MG ELEMENTAL CALCIUM) 500 MG chewable tablet Chew 1,000-1,500 mg by mouth daily as needed for indigestion or heartburn.   Yes [provider]  cinacalcet (SENSIPAR) 60 MG tablet Take 60 mg by mouth every other day.   Yes [provider]  ferric citrate (AURYXIA) 1 GM 210 MG(Fe) tablet Take 420-840 mg by mouth See admin instructions. Take 3-4 tablets (630 mg-840 mg) by mouth with each meal & take 1-2 tablets (210-420 mg) by mouth with each snack.   Yes [provider]  LANTUS 100 UNIT/ML injection INJECT 0.1-0.15 MLS (10-15 UNITS) INTO THE SKIN 2 TIMES DAILY. PATIENT NEEDS APPOINTMENT Patient taking differently: Inject 15-20 Units into the skin 2 (two) times daily. 03/19/21  Yes Dameron, Luna Fuse, DO  levothyroxine (SYNTHROID) 75 MCG tablet TAKE 1 TABLET BY MOUTH ONCE DAILY BEFORE BREAKFAST Patient taking differently: Take 75 mcg by mouth daily before breakfast. 03/19/21  Yes Dameron, Luna Fuse, DO  multivitamin (RENA-VIT) TABS tablet Take 1 tablet by mouth at bedtime. Patient taking differently: Take 1 tablet by mouth every evening. 10/25/16  Yes Nicolette Bang, MD  rosuvastatin (CRESTOR) 20 MG tablet Take 2 tablets by mouth once daily Patient taking differently: Take 20 mg by mouth daily.  03/12/21  Yes Dameron, Luna Fuse, DO  Accu-Chek FastClix Lancets MISC USE AS DIRECTED ONCE DAILY 06/01/20   Wilber Oliphant, MD  Blood Glucose Monitoring Suppl (ACCU-CHEK GUIDE ME) w/Device KIT 1 kit by Does not apply route daily. 02/05/19   Wilber Oliphant, MD  glucose blood (ACCU-CHEK GUIDE) test strip 1 each by Other route 2 (two) times daily. Use as instructed 02/05/19   Wilber Oliphant, MD  heparin 1000 unit/mL SOLN injection Heparin Sodium (Porcine) 1,000 Units/mL Systemic 08/15/20 08/14/21  [provider]  Syringe, Disposable, (B-D SYRINGE SLIP TIP 30CC) 30 ML MISC 1 Syringe by Does not apply route daily. 01/31/19   Wilber Oliphant, MD     Family History  Problem Relation Age of Onset   Heart disease Mother    Hypertension Mother     Social History   Socioeconomic History   Marital status: Single    Spouse name: Not on file   Number of children: Not on file   Years of education: Not on file  Highest education level: Not on file  Occupational History   Not on file  Tobacco Use   Smoking status: Never   Smokeless tobacco: Never  Vaping Use   Vaping Use: Never used  Substance and Sexual Activity   Alcohol use: No   Drug use: Yes    Frequency: 2.0 times per week    Types: Marijuana    Comment: Last time 07/23/20   Sexual activity: Not on file  Other Topics Concern   Not on file  Social History Narrative   Not on file   Social Determinants of Health   Financial Resource Strain: Not on file  Food Insecurity: Not on file  Transportation Needs: Not on file  Physical Activity: Not on file  Stress: Not on file  Social Connections: Not on file    Review of Systems: A 12 point ROS discussed and pertinent positives are indicated in the HPI above.  All other systems are negative.  Review of Systems  Constitutional:  Positive for fatigue. Negative for appetite change.  Respiratory:  Negative for cough and shortness of breath.   Cardiovascular:  Negative for chest pain and leg  swelling.  Gastrointestinal:  Negative for abdominal pain, diarrhea, nausea and vomiting.  Neurological:  Negative for dizziness and headaches.   Vital Signs: BP 137/80    Pulse 85    Temp 98 F (36.7 C) (Oral)    Resp 12    Ht _0  (1.854 m)    Wt (!) 332 lb 14.3 oz (151 kg)    SpO2 100%    BMI 43.92 kg/m   Physical Exam Constitutional:      General: He is not in acute distress.    Appearance: He is obese. He is not ill-appearing.  HENT:     Mouth/Throat:     Mouth: Mucous membranes are moist.     Pharynx: Oropharynx is clear.  Cardiovascular:     Rate and Rhythm: Normal rate and regular rhythm.     Comments: RIJ tunneled HD catheter, currently in use for HD. Site unremarkable.  Pulmonary:     Effort: Pulmonary effort is normal.     Breath sounds: Normal breath sounds.  Abdominal:     General: Bowel sounds are normal.     Palpations: Abdomen is soft.     Tenderness: There is no abdominal tenderness.  Musculoskeletal:     Comments: Left foot wrapped in gauze/ace from recent toe amputation.   Skin:    General: Skin is warm and dry.  Neurological:     Mental Status: He is alert and oriented to person, place, and time.  Psychiatric:        Mood and Affect: Mood normal.        Behavior: Behavior normal.        Thought Content: Thought content normal.    Imaging: CT Foot Left Wo Contrast  Result Date: 05/06/2021 CLINICAL DATA:  Foot wound infection.  Osteomyelitis suspected EXAM: CT OF THE LEFT FOOT WITHOUT CONTRAST TECHNIQUE: Multidetector CT imaging of the left foot was performed according to the standard protocol. Multiplanar CT image reconstructions were also generated. COMPARISON:  None. FINDINGS: Bones/Joint/Cartilage No cortical erosion or periosteal reaction to suggest acute osteomyelitis. No evidence of fracture or dislocation Ligaments Suboptimally assessed by CT. Muscles and Tendons Muscles are normal in bulk and density. No intramuscular fluid collection. Soft  tissues Skin thickening and marked subcutaneous soft tissue edema about the first digit. There is deep skin wound  on the plantar aspect of the first digit about the interphalangeal joint. No drainable fluid collection or abscess on this noncontrast enhanced examination. IMPRESSION: 1. Deep skin wound about the plantar aspect of the interphalangeal joint of the first digit. Skin thickening and subcutaneous soft tissue edema consistent with cellulitis. No cortical erosion or periosteal reaction suggesting osteomyelitis, however early osteomyelitis can not be excluded on CT examination. Further evaluation with MRI examination is recommended. 2. No drainable fluid collection or abscess on this noncontrast enhanced examination. Electronically Signed   By: Keane Police D.O.   On: 05/06/2021 18:11   MR FOOT LEFT WO CONTRAST  Result Date: 05/07/2021 CLINICAL DATA:  Left great toe ulcer.  Left foot pain and redness. EXAM: MRI OF THE LEFT FOOT WITHOUT CONTRAST TECHNIQUE: Multiplanar, multisequence MR imaging of the left forefoot was performed. No intravenous contrast was administered. COMPARISON:  CT left foot from yesterday. FINDINGS: Bones/Joint/Cartilage Fairly confluent marrow edema involving the first proximal and distal phalanges, as well as the hallux sesamoids, with patchy T1 hypointensity, consistent with osteomyelitis. No fracture or dislocation. Joint spaces are preserved. No significant joint effusion. Ligaments Collateral ligaments are intact. Muscles and Tendons Partially torn distal flexor hallucis longus tendon with small amount of fluid surrounding the tendon. Remaining flexor and extensor tendons are intact. Increased T2 signal within the intrinsic muscles of the forefoot, nonspecific, but likely related to diabetic muscle changes. Soft tissue Soft tissue ulceration at the plantar base of the first IP joint. 2.0 x 0.7 x 1.3 cm fluid collection along the lateral aspect of the first proximal phalanx  (series 7, image 22). IMPRESSION: 1. Soft tissue ulceration at the plantar base of the first IP joint with underlying osteomyelitis of the first proximal and distal phalanges, as well as the hallux sesamoids. 2. 2.0 cm abscess along the lateral aspect of the first proximal phalanx. 3. Partially torn distal flexor hallucis longus tendon with mild infectious tenosynovitis. Electronically Signed   By: Titus Dubin M.D.   On: 05/07/2021 10:45   DG Chest Port 1 View  Result Date: 05/06/2021 CLINICAL DATA:  Possible septicemia EXAM: PORTABLE CHEST 1 VIEW COMPARISON:  Previous studies including the examination 1031 2019 FINDINGS: Transverse diameter of heart is in the upper limits of normal. Central pulmonary vessels are prominent. There are no signs of alveolar pulmonary edema or focal pulmonary consolidation. Right hemidiaphragm is elevated. There is crowding of markings in the right lower lung fields. There is no pleural effusion or pneumothorax. Tip dialysis catheter is seen at the junction of superior vena cava and right atrium. IMPRESSION: There are no signs of pulmonary edema or focal pulmonary consolidation. Electronically Signed   By: Elmer Picker M.D.   On: 05/06/2021 16:38   VAS [place] ABI WITH/WO TBI  Result Date: 05/08/2021  LOWER EXTREMITY DOPPLER STUDY Patient Name:  OBERON HEHIR  Date of Exam:   05/07/2021 Medical Rec #: [id number]      Accession #:    [id number] Date of Birth: November 20, 1990       Patient Gender: M Patient Age:   25 years Exam Location:  [facility] Procedure:      VAS [place] ABI WITH/WO TBI Referring Phys: Deonte DUDA --------------------------------------------------------------------------------  Indications: Left great toe ulceration. High Risk Factors: Hyperlipidemia, Diabetes. Other Factors: ESRD on dialysis.  Limitations: Today's exam was limited due to Bandaging, history of multiple UE              dialysis accesses bilaterally. Comparison Study:  No prior studies.  Performing Technologist: Darlin Coco RDMS RVT  Examination Guidelines: A complete evaluation includes at minimum, Doppler waveform signals and systolic blood pressure reading at the level of bilateral brachial, anterior tibial, and posterior tibial arteries, when vessel segments are accessible. Bilateral testing is considered an integral part of a complete examination. Photoelectric Plethysmograph (PPG) waveforms and toe systolic pressure readings are included as required and additional duplex testing as needed. Limited examinations for reoccurring indications may be performed as noted.  ABI Findings: +---------+------------------+-----+---------+---------------------------------+  Right     Rt Pressure (mmHg) Index Waveform  Comment                            +---------+------------------+-----+---------+---------------------------------+  Brachial                           triphasic Brachial waveform only, unable to                                                obtain pressure due to hx of                                                     dialysis access                    +---------+------------------+-----+---------+---------------------------------+  PTA       128                1.28  triphasic                                    +---------+------------------+-----+---------+---------------------------------+  DP        102                1.02  triphasic                                    +---------+------------------+-----+---------+---------------------------------+  Great Toe 70                 0.70  Normal                                       +---------+------------------+-----+---------+---------------------------------+ +---------+------------------+-----+---------+---------------------------------+  Left      Lt Pressure (mmHg) Index Waveform  Comment                            +---------+------------------+-----+---------+---------------------------------+  Brachial  100                       triphasic Brachial waveform, radial/forearm                                                pressure  due to hx of dialysis                                                   access                             +---------+------------------+-----+---------+---------------------------------+  PTA       126                1.26  triphasic                                    +---------+------------------+-----+---------+---------------------------------+  DP        114                1.14  triphasic                                    +---------+------------------+-----+---------+---------------------------------+  Great Toe                                    Unable to obtain due to                                                          wound/bandaging                    +---------+------------------+-----+---------+---------------------------------+  Summary: Right: Resting right ankle-brachial index is within normal range. No evidence of significant right lower extremity arterial disease. The right toe-brachial index is normal. Left: Resting left ankle-brachial index is within normal range. No evidence of significant left lower extremity arterial disease.  *See table(s) above for measurements and observations.  Electronically signed by Orlie Pollen on 05/08/2021 at 10:25:27 AM.    Final    ECHOCARDIOGRAM COMPLETE  Result Date: 05/09/2021    ECHOCARDIOGRAM REPORT   Patient Name:   MAURIO BAIZE Date of Exam: 05/09/2021 Medical Rec #:  [id number]     Height:       73.0 in Accession #:    [id number]    Weight:       319.0 lb Date of Birth:  07-Jan-1991      BSA:          2.624 m Patient Age:    30 years      BP:           105/55 mmHg Patient Gender: M             HR:           97 bpm. Exam Location:  Inpatient Procedure: 2D Echo, Color Doppler and Cardiac Doppler Indications:    Bacteremia  History:        Patient has no prior history of Echocardiogram examinations.                 Risk Factors:Diabetes and Hypertension.   Sonographer:    Jyl Heinz Referring  Phys: [id number] [place] IMPRESSIONS  1. Left ventricular ejection fraction, by estimation, is 60 to 65%. The left ventricle has normal function. The left ventricle has no regional wall motion abnormalities. Left ventricular diastolic parameters were normal.  2. Right ventricular systolic function is normal. The right ventricular size is normal.  3. The mitral valve is normal in structure. No evidence of mitral valve regurgitation. No evidence of mitral stenosis.  4. The aortic valve is normal in structure. Aortic valve regurgitation is not visualized. No aortic stenosis is present.  5. The inferior vena cava is normal in size with greater than 50% respiratory variability, suggesting right atrial pressure of 3 mmHg. Conclusion(s)/Recommendation(s): No evidence of valvular vegetations on this transthoracic echocardiogram. Consider a transesophageal echocardiogram to exclude infective endocarditis if clinically indicated. FINDINGS  Left Ventricle: Left ventricular ejection fraction, by estimation, is 60 to 65%. The left ventricle has normal function. The left ventricle has no regional wall motion abnormalities. The left ventricular internal cavity size was normal in size. There is  no left ventricular hypertrophy. Left ventricular diastolic parameters were normal. Right Ventricle: The right ventricular size is normal. No increase in right ventricular wall thickness. Right ventricular systolic function is normal. Left Atrium: Left atrial size was normal in size. Right Atrium: Right atrial size was normal in size. Pericardium: There is no evidence of pericardial effusion. Mitral Valve: The mitral valve is normal in structure. No evidence of mitral valve regurgitation. No evidence of mitral valve stenosis. Tricuspid Valve: The tricuspid valve is normal in structure. Tricuspid valve regurgitation is not demonstrated. No evidence of tricuspid stenosis. Aortic Valve: The aortic  valve is normal in structure. Aortic valve regurgitation is not visualized. No aortic stenosis is present. Aortic valve peak gradient measures 7.2 mmHg. Pulmonic Valve: The pulmonic valve was normal in structure. Pulmonic valve regurgitation is not visualized. No evidence of pulmonic stenosis. Aorta: The aortic root is normal in size and structure. Venous: The inferior vena cava is normal in size with greater than 50% respiratory variability, suggesting right atrial pressure of 3 mmHg. IAS/Shunts: No atrial level shunt detected by color flow Doppler.  LEFT VENTRICLE PLAX 2D LVIDd:         4.10 cm      Diastology LVIDs:         2.90 cm      LV e' medial:    7.94 cm/s LV PW:         1.20 cm      LV E/e' medial:  11.0 LV IVS:        1.10 cm      LV e' lateral:   10.60 cm/s LVOT diam:     2.20 cm      LV E/e' lateral: 8.2 LV SV:         78 LV SV Index:   30 LVOT Area:     3.80 cm  LV Volumes (MOD) LV vol d, MOD A2C: 127.0 ml LV vol d, MOD A4C: 140.0 ml LV vol s, MOD A2C: 47.8 ml LV vol s, MOD A4C: 52.4 ml LV SV MOD A2C:     79.2 ml LV SV MOD A4C:     140.0 ml LV SV MOD BP:      84.8 ml RIGHT VENTRICLE             IVC RV Basal diam:  3.20 cm     IVC diam: 1.20 cm RV Mid diam:    2.00 cm  RV S prime:     15.50 cm/s TAPSE (M-mode): 2.0 cm LEFT ATRIUM             Index        RIGHT ATRIUM           Index LA diam:        3.60 cm 1.37 cm/m   RA Area:     13.80 cm LA Vol (A2C):   40.6 ml 15.47 ml/m  RA Volume:   34.10 ml  13.00 ml/m LA Vol (A4C):   38.0 ml 14.48 ml/m LA Biplane Vol: 39.6 ml 15.09 ml/m  AORTIC VALVE AV Area (Vmax): 3.18 cm AV Vmax:        134.00 cm/s AV Peak Grad:   7.2 mmHg LVOT Vmax:      112.00 cm/s LVOT Vmean:     87.500 cm/s LVOT VTI:       0.205 m  AORTA Ao Root diam: 3.20 cm Ao Asc diam:  3.60 cm MITRAL VALVE MV Area (PHT): 4.89 cm    SHUNTS MV Decel Time: 155 msec    Systemic VTI:  0.20 m MV E velocity: 87.00 cm/s  Systemic Diam: 2.20 cm MV A velocity: 64.00 cm/s MV E/A ratio:  1.36 Mihai  Croitoru MD Electronically signed by Sanda Klein MD Signature Date/Time: 05/09/2021/2:08:43 PM    Final     Labs:  CBC: Recent Labs    05/08/21 4081 05/09/21 0232 05/10/21 0331 05/11/21 0824  WBC 19.7* 12.6* 9.7 10.0  HGB 11.9* 11.4* 12.0* 11.3*  HCT 35.9* 35.9* 37.0* 35.0*  PLT 237 230 299 362    COAGS: Recent Labs    05/06/21 1600  INR 1.3*  APTT 34    BMP: Recent Labs    05/06/21 1600 05/07/21 0312 05/08/21 0621 05/11/21 0824  NA 134* 132* 134* 133*  K 3.8 3.2* 3.7 3.9  CL 92* 94* 93* 97*  CO2 _0 GLUCOSE 163* 158* 109* 199*  BUN 30* 32* 47* 63*  CALCIUM 9.7 9.2 8.8* 8.9  CREATININE 11.66* 13.15* 16.36* 16.25*  GFRNONAA 6* 5* 4* 4*    LIVER FUNCTION TESTS: Recent Labs    05/06/21 1600 05/11/21 0824  BILITOT 0.7  --   AST 21  --   ALT 12  --   ALKPHOS 69  --   PROT 9.8*  --   ALBUMIN 4.3 2.9*    TUMOR MARKERS: No results for input(s): AFPTM, CEA, CA199, CHROMGRNA in the last 8760 hours.  Assessment and Plan:  MSSA bacteremia; ESRD on hemodialysis via RIJ tunneled dialysis catheter: Paul [name], 30 year old male, is scheduled today for removal of the RIJ tunneled dialysis catheter. He is currently in HD and IR will plan to see him in our department this afternoon for line removal. He will return to IR Thursday, 05/13/21 for placement of a new tunneled dialysis catheter. These procedures were discussed and consents obtained during Paul [name] HD session.   Risks and benefits discussed with the patient including, but not limited to bleeding, infection, vascular injury, pneumothorax which may require chest tube placement, air embolism or even death  All of the patient's questions were answered, patient is agreeable to proceed. He will be NPO at midnight the day of the procedure (new placement of tunneled HD catheter).   Consents for the line removal and placement of a new tunneled HD catheter are in the IR control room.   Thank you for  this interesting consult.  I greatly enjoyed meeting Paul [name] and look forward to participating in their care.  A copy of this report was sent to the requesting provider on this date.  Electronically Signed: Soyla Dryer, AGACNP-BC [phone] 05/11/2021, 9:32 AM   I spent a total of 20 Minutes    in face to face in clinical consultation, greater than 50% of which was counseling/coordinating care for tunneled dialysis catheter removal; placement of a new dialysis catheter.

## 2021-05-11 NOTE — TOC Progression Note (Signed)
Transition of Care [facility]) - Progression Note    Patient Details  Name: Paul [name] MRN: [id number] Date of Birth: 1991-02-10  Transition of Care [facility] CM/SW [place], [place] Phone Number: 05/11/2021, 9:56 AM  Clinical Narrative:     CSW received voicemail from pt's sister. She explains pt will be going to live with her at discharge. She thinks he will need a wheel chair. CSW explained that PT may recommend a wheelchair and in that case RNCM would arrange wheelchair.        Expected Discharge Plan and Services                                                 Social Determinants of Health (SDOH) Interventions    Readmission Risk Interventions No flowsheet data found.

## 2021-05-11 NOTE — Progress Notes (Signed)
Successful removal of right IJ tunneled HD catheter.   After obtaining consent and performing a time-out, the right upper chest was prepped and draped in the normal sterile fashion. The heparin was removed from both ports. 1% lidocaine was used for local anesthesia. Using gentle blunt dissection and moderate manual traction the cuff of the catheter was exposed and the catheter was removed in its entirety. Pressure was held until hemostasis was obtained. A sterile dressing was applied. The patient tolerated the procedure well with no immediate complications.   Soyla Dryer, [place] [phone] 05/11/2021, 4:25 PM

## 2021-05-11 NOTE — H&P (View-Only) (Signed)
PROGRESS NOTE    Paul [name]  TKZ:[id number] DOB: Nov 28, 1990 DOA: 05/06/2021 PCP: Paul Brill, DO   Chief Complaint  Patient presents with   Wound Infection   Brief Narrative/Hospital Course: Paul [name], 30 y.o. male with PMH of ESRD and HD TTS, T2DM, morbid obesity bmi 43, HTN, HLD, anemia of chronic disease, hypothyroidism presented with wound on the left great toe along with redness, pain initially started as a blister and ulcer on the bottom of the left great toe few weeks ago and got worse in last 2 to 3 days. He was seen in the ED systolic blood pressure insulin 80s [name] IV fluid boluses CT scan of the fluid no periosteal reaction MRI recommended, also had leukocytosis lactic acidosis abnormal LFTs and concern about severe sepsis PCCM was consulted and after discussion transferred under hospitalist for admission for severe sepsis. 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 Vital stable No new complaints.  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 Culture polymicrobial, blood culture MSSA repeat blood culture negative, planning for line holiday after dialysis today.  TTE unremarkable planning TEE Wednesday.  Continue cefazolin IV.  Also amatory/Paul [name] x5 days for "mop up" of polymicrobial infection Recent Labs  Lab 05/06/21 1600 05/06/21 1740 05/07/21 0312 05/07/21 0610 05/08/21 0621 05/09/21 0232 05/10/21 0331 05/11/21 0824  WBC 19.5*  --  18.2*  --  19.7* 12.6* 9.7 10.0  LATICACIDVEN 2.3* 2.0* 1.0 1.2  --   --   --   --      T2DM on insulin with CKD and diabetic neuropathy c well-controlled,  continue SSI, Lantus. Stable HbA1c 6 , started on Neurontin for neuropathy-will need Rx. Recent Labs  Lab 05/10/21 0809 05/10/21 1131 05/10/21 1701 05/10/21 2030 05/11/21 0645  GLUCAP 260* 123* 194* 126* 122*    HTN: Controlled.  Was initiated on midodrine-we will discontinue after today's dose, home meds on hold HLD: Continue statin ESRD and HD TTS: Nephrology on board for HD today then line holiday-nephrology  to consult IR Anemia of chronic disease: stabe, monitor hemoglobin Recent Labs  Lab 05/07/21 0312 05/08/21 0621 05/09/21 0232 05/10/21 0331 05/11/21 0824  HGB 11.9* 11.9* 11.4* 12.0* 11.3*  HCT 36.8* 35.9* 35.9* 57.3* 22.0*  Metabolic bone disease: cont home meds  Hypokalemia-resolved Hypothyroidism: cont Synthroid Class III Obesity:Patient's Body mass index is 43.92 kg/m. : Will benefit with PCP follow-up, weight loss  healthy lifestyle and outpatient sleep evaluation.  DVT prophylaxis: heparin injection 5,000 Units Start: 05/07/21 1400 Code Status:   Code Status: Full Code Family Communication: plan of care discussed with patient at bedside. Status is: Inpatient Remains inpatient appropriate because: For IV antibiotics and management of osteomyelitis Disposition: Currently not medically stable for discharge. Anticipated Disposition: To sisters home in [place] patient requesting dialysis as per the [facility] while he is at sister's house- SW informed.  Objective: Vitals last 24 hrs: Vitals:   05/11/21 0842 05/11/21 0900 05/11/21 0930 05/11/21 1000  BP: (!) 142/77 137/80 133/71 130/78  Pulse: 86 85 82 86  Resp:      Temp:      TempSrc:      SpO2:  Weight:      Height:       Weight change:  No intake or output data in the 24 hours ending 05/11/21 1023  Net IO Since Admission: 1,452.94 mL [05/11/21 1023]   Physical Examination: General exam: AAOx 3, pleasant, obese HEENT:Oral mucosa moist, Ear/Nose WNL grossly, dentition  normal. Respiratory system: bilaterally clear, no use of accessory muscle Cardiovascular system: S1 & S2 +, No JVD,. Gastrointestinal system: Abdomen soft, NT,ND, BS+ Nervous System:Alert, awake, moving extremities and grossly nonfocal Extremities: No edema, LLE wound with dressing in place Skin: No rashes,no icterus. MSK: Normal muscle bulk,tone, power   Medications reviewed:  Scheduled Meds:  amoxicillin-clavulanate  1 tablet Oral QHS   Chlorhexidine Gluconate Cloth  6 each Topical Q0600   cinacalcet  60 mg Oral QODAY   ferric citrate  630 mg Oral TID WC   gabapentin  100 mg Oral TID   heparin  5,000 Units Subcutaneous Q8H   insulin aspart  0-6 Units Subcutaneous TID WC & HS   insulin glargine-yfgn  5 Units Subcutaneous Daily   levothyroxine  75 mcg Oral QAC breakfast   melatonin  3 mg Oral QHS   midodrine  5 mg Oral TID WC   multivitamin  1 tablet Oral QPM   rosuvastatin  20 mg Oral Daily   sodium chloride flush  3 mL Intravenous Q12H  Continuous Infusions:  sodium chloride      ceFAZolin (ANCEF) IV 2 g (05/08/21 1856)   Diet Order             Diet renal/carb modified with fluid restriction Diet-HS Snack? Nothing; Fluid restriction: 1200 mL Fluid; Room service appropriate? Yes; Fluid consistency: Thin  Diet effective now                 Weight change:   Wt Readings from Last 3 Encounters:  05/11/21 (!) 151 kg  03/26/21 (!) 149.7 kg  03/17/21 (!) 151 kg  Consultants:see note  Procedures:see note Antimicrobials: Anti-infectives (From admission, onward)    Start     Dose/Rate Route Frequency Ordered Stop   05/10/21 2200  amoxicillin-clavulanate (AUGMENTIN) 500-125 MG per tablet 500 mg        1 tablet Oral Daily at bedtime 05/10/21 1529 05/15/21 2159   05/08/21 1800  ceFAZolin (ANCEF) IVPB 2g/100 mL premix        2 g 200 mL/hr over 30 Minutes Intravenous Every T-Th-Sa (1800) 05/07/21 1239     05/08/21 1600  amoxicillin (AMOXIL) capsule 500 mg  Status:   Discontinued        500 mg Oral Every 24 hours 05/08/21 1535 05/10/21 1522   05/08/21 1200  vancomycin (VANCOCIN) IVPB 1000 mg/200 mL premix  Status:  Discontinued        1,000 mg 200 mL/hr over 60 Minutes Intravenous Every T-Th-Sa (Hemodialysis) 05/07/21 0233 05/07/21 1239   05/08/21 0600  ceFAZolin (ANCEF) IVPB 3g/100 mL premix        3 g 200 mL/hr over 30 Minutes Intravenous On call to O.R. 05/07/21 1857 05/08/21 0755   05/07/21 1530  ceFAZolin (ANCEF) IVPB 2g/100 mL premix  Status:  Discontinued        2 g 200 mL/hr over 30 Minutes Intravenous To Radiology 05/07/21 1436 05/07/21 1615   05/06/21 1700  vancomycin (VANCOCIN) IVPB 1000 mg/200 mL premix        1,000 mg 200 mL/hr over 60 Minutes Intravenous Every 1 hr x 2 05/06/21 1621 05/06/21 2005  05/06/21 1630  ceFEPIme (MAXIPIME) 2 g in sodium chloride 0.9 % 100 mL IVPB        2 g 200 mL/hr over 30 Minutes Intravenous  Once 05/06/21 1621 05/06/21 1701   05/06/21 1630  metroNIDAZOLE (FLAGYL) IVPB 500 mg        500 mg 100 mL/hr over 60 Minutes Intravenous  Once 05/06/21 1621 05/06/21 1728   05/06/21 1615  piperacillin-tazobactam (ZOSYN) IVPB 2.25 g  Status:  Discontinued        2.25 g 100 mL/hr over 30 Minutes Intravenous  Once 05/06/21 1614 05/06/21 1621      Culture/Microbiology    Component Value Date/Time   SDES TISSUE 05/08/2021 0810   SPECREQUEST TIS FROM GREAT LEFT TOE SPEC A 05/08/2021 0810   CULT  05/08/2021 0810    MODERATE STAPHYLOCOCCUS AUREUS NO ANAEROBES ISOLATED; CULTURE IN PROGRESS FOR 5 DAYS    REPTSTATUS PENDING 05/08/2021 0810    Other culture-see note  Unresulted Labs (From admission, onward)     Start     Ordered   05/12/21 0500  CBC  Tomorrow morning,   R       Question:  Specimen collection method  Answer:  Lab=Lab collect   05/11/21 0711   05/12/21 2637  Basic metabolic panel  Tomorrow morning,   R       Question:  Specimen collection method  Answer:  Lab=Lab collect   05/11/21 0711   05/06/21  1602  Urinalysis, Routine w reflex microscopic  (Septic presentation on arrival (screening labs, nursing and treatment orders for obvious sepsis))  ONCE - STAT,   STAT        05/06/21 1605   Signed and Held  Renal function panel  Once,   R       Question:  Specimen collection method  Answer:  Lab=Lab collect   Signed and Held   Signed and Held  CBC  Once,   R       Question:  Specimen collection method  Answer:  Lab=Lab collect   Signed and Held          Data Reviewed: I have personally reviewed following labs and imaging studies CBC: Recent Labs  Lab 05/06/21 1600 05/07/21 0312 05/08/21 0621 05/09/21 0232 05/10/21 0331 05/11/21 0824  WBC 19.5* 18.2* 19.7* 12.6* 9.7 10.0  NEUTROABS 15.0*  --   --   --   --   --   HGB 14.3 11.9* 11.9* 11.4* 12.0* 11.3*  HCT 43.0 36.8* 35.9* 35.9* 37.0* 35.0*  MCV 84.0 84.0 83.7 84.3 84.3 83.7  PLT 226 202 237 230 299 858   Basic Metabolic Panel: Recent Labs  Lab 05/06/21 1600 05/07/21 0312 05/08/21 0621 05/11/21 0824  NA 134* 132* 134* 133*  K 3.8 3.2* 3.7 3.9  CL 92* 94* 93* 97*  CO2 25 23 22 22   GLUCOSE 163* 158* 109* 199*  BUN 30* 32* 47* 63*  CREATININE 11.66* 13.15* 16.36* 16.25*  CALCIUM 9.7 9.2 8.8* 8.9  PHOS  --   --   --  4.0   GFR: Estimated Creatinine Clearance: 10.4 mL/min (A) (by C-G formula based on SCr of 16.25 mg/dL (H)). Liver Function Tests: Recent Labs  Lab 05/06/21 1600 05/11/21 0824  AST 21  --   ALT 12  --   ALKPHOS 69  --   BILITOT 0.7  --   PROT 9.8*  --   ALBUMIN 4.3 2.9*   No results for input(s): LIPASE,  AMYLASE in the last 168 hours. No results for input(s): AMMONIA in the last 168 hours. Coagulation Profile: Recent Labs  Lab 05/06/21 1600  INR 1.3*   Cardiac Enzymes: No results for input(s): CKTOTAL, CKMB, CKMBINDEX, TROPONINI in the last 168 hours. BNP (last 3 results) No results for input(s): PROBNP in the last 8760 hours. HbA1C: No results for input(s): HGBA1C in the last 72  hours.  CBG: Recent Labs  Lab 05/10/21 0809 05/10/21 1131 05/10/21 1701 05/10/21 2030 05/11/21 0645  GLUCAP 260* 123* 194* 126* 122*   Lipid Profile: No results for input(s): CHOL, HDL, LDLCALC, TRIG, CHOLHDL, LDLDIRECT in the last 72 hours. Thyroid Function Tests: No results for input(s): TSH, T4TOTAL, FREET4, T3FREE, THYROIDAB in the last 72 hours. Anemia Panel: No results for input(s): VITAMINB12, FOLATE, FERRITIN, TIBC, IRON, RETICCTPCT in the last 72 hours. Sepsis Labs: Recent Labs  Lab 05/06/21 1600 05/06/21 1740 05/07/21 0312 05/07/21 0610  LATICACIDVEN 2.3* 2.0* 1.0 1.2    Recent Results (from the past 240 hour(s))  Resp Panel by RT-PCR (Flu A&B, Covid) Nasopharyngeal Swab     Status: None   Collection Time: 05/06/21  3:37 PM   Specimen: Nasopharyngeal Swab; Nasopharyngeal(NP) swabs in vial transport medium  Result Value Ref Range Status   SARS Coronavirus 2 by RT PCR NEGATIVE NEGATIVE Final    Comment: (NOTE) SARS-CoV-2 target nucleic acids are NOT DETECTED.  The SARS-CoV-2 RNA is generally detectable in upper respiratory specimens during the acute phase of infection. The lowest concentration of SARS-CoV-2 viral copies this assay can detect is 138 copies/mL. A negative result does not preclude SARS-Cov-2 infection and should not be used as the sole basis for treatment or other patient management decisions. A negative result may occur with  improper specimen collection/handling, submission of specimen other than nasopharyngeal swab, presence of viral mutation(s) within the areas targeted by this assay, and inadequate number of viral copies(<138 copies/mL). A negative result must be combined with clinical observations, patient history, and epidemiological information. The expected result is Negative.  Fact Sheet for Patients:  EntrepreneurPulse.com.au  Fact Sheet for Healthcare Providers:  IncredibleEmployment.be  This  test is no t yet approved or cleared by the [place] FDA and  has been authorized for detection and/or diagnosis of SARS-CoV-2 by FDA under an Emergency Use Authorization (EUA). This EUA will remain  in effect (meaning this test can be used) for the duration of the COVID-19 declaration under Section 564(b)(1) of the Act, 21 U.S.C.section 360bbb-3(b)(1), unless the authorization is terminated  or revoked sooner.       Influenza A by PCR NEGATIVE NEGATIVE Final   Influenza B by PCR NEGATIVE NEGATIVE Final    Comment: (NOTE) The Xpert Xpress SARS-CoV-2/FLU/RSV plus assay is intended as an aid in the diagnosis of influenza from Nasopharyngeal swab specimens and should not be used as a sole basis for treatment. Nasal washings and aspirates are unacceptable for Xpert Xpress SARS-CoV-2/FLU/RSV testing.  Fact Sheet for Patients: EntrepreneurPulse.com.au  Fact Sheet for Healthcare Providers: IncredibleEmployment.be  This test is not yet approved or cleared by the [place] FDA and has been authorized for detection and/or diagnosis of SARS-CoV-2 by FDA under an Emergency Use Authorization (EUA). This EUA will remain in effect (meaning this test can be used) for the duration of the COVID-19 declaration under Section 564(b)(1) of the Act, 21 U.S.C. section 360bbb-3(b)(1), unless the authorization is terminated or revoked.  Performed at [facility], [address] [place],  [place] [address]   Culture, blood (x 2)     Status: Abnormal   Collection Time: 05/06/21  4:00 PM   Specimen: BLOOD  Result Value Ref Range Status   Specimen Description   Final    BLOOD BLOOD RIGHT HAND Performed at [facility], [address] [place], [place] [address]    Special Requests   Final    BOTTLES DRAWN AEROBIC ONLY Blood Culture results may not be optimal due to an inadequate volume of blood received in culture bottles Performed at  [facility], [address] [place], [place] [address]    Culture  Setup Time   Final    GRAM POSITIVE COCCI AEROBIC BOTTLE ONLY CRITICAL RESULT CALLED TO, READ BACK BY AND VERIFIED WITH: PHARMD GREG ABBOTT 05/08/21@4 :17 BY TW    Culture (A)  Final    STAPHYLOCOCCUS AUREUS SUSCEPTIBILITIES PERFORMED ON PREVIOUS CULTURE WITHIN THE LAST 5 DAYS. Performed at [facility], [place] [address] [place], [place] [address]    Report Status 05/09/2021 FINAL  Final  Blood Culture ID Panel (Reflexed)     Status: Abnormal   Collection Time: 05/06/21  4:00 PM  Result Value Ref Range Status   Enterococcus faecalis NOT DETECTED NOT DETECTED Final   Enterococcus Faecium NOT DETECTED NOT DETECTED Final   Listeria monocytogenes NOT DETECTED NOT DETECTED Final   Staphylococcus species DETECTED (A) NOT DETECTED Final    Comment: CRITICAL RESULT CALLED TO, READ BACK BY AND VERIFIED WITH: PHARMD GREG ABBOTT 05/08/21@4 :17 BY TW    Staphylococcus aureus (BCID) DETECTED (A) NOT DETECTED Final    Comment: CRITICAL RESULT CALLED TO, READ BACK BY AND VERIFIED WITH: PHARMD GREG ABBOTT 05/08/21@4 :17 BY TW    Staphylococcus epidermidis NOT DETECTED NOT DETECTED Final   Staphylococcus lugdunensis NOT DETECTED NOT DETECTED Final   Streptococcus species NOT DETECTED NOT DETECTED Final   Streptococcus agalactiae NOT DETECTED NOT DETECTED Final   Streptococcus pneumoniae NOT DETECTED NOT DETECTED Final   Streptococcus pyogenes NOT DETECTED NOT DETECTED Final   A.calcoaceticus-baumannii NOT DETECTED NOT DETECTED Final   Bacteroides fragilis NOT DETECTED NOT DETECTED Final   Enterobacterales NOT DETECTED NOT DETECTED Final   Enterobacter cloacae complex NOT DETECTED NOT DETECTED Final   Escherichia coli NOT DETECTED NOT DETECTED Final   Klebsiella aerogenes NOT DETECTED NOT DETECTED Final   Klebsiella oxytoca NOT DETECTED NOT DETECTED Final   Klebsiella pneumoniae NOT DETECTED NOT DETECTED Final    Proteus species NOT DETECTED NOT DETECTED Final   Salmonella species NOT DETECTED NOT DETECTED Final   Serratia marcescens NOT DETECTED NOT DETECTED Final   Haemophilus influenzae NOT DETECTED NOT DETECTED Final   Neisseria meningitidis NOT DETECTED NOT DETECTED Final   Pseudomonas aeruginosa NOT DETECTED NOT DETECTED Final   Stenotrophomonas maltophilia NOT DETECTED NOT DETECTED Final   Candida albicans NOT DETECTED NOT DETECTED Final   Candida auris NOT DETECTED NOT DETECTED Final   Candida glabrata NOT DETECTED NOT DETECTED Final   Candida krusei NOT DETECTED NOT DETECTED Final   Candida parapsilosis NOT DETECTED NOT DETECTED Final   Candida tropicalis NOT DETECTED NOT DETECTED Final   Cryptococcus neoformans/gattii NOT DETECTED NOT DETECTED Final   Meth resistant mecA/C and MREJ NOT DETECTED NOT DETECTED Final    Comment: Performed at [facility], [address] [place], [place] [address]  Culture, blood (x 2)     Status: Abnormal   Collection Time: 05/06/21  4:15  PM   Specimen: BLOOD  Result Value Ref Range Status   Specimen Description   Final    BLOOD BLOOD RIGHT HAND Performed at [facility], [address] [place], [place] [address]    Special Requests   Final    BOTTLES DRAWN AEROBIC AND ANAEROBIC Blood Culture adequate volume Performed at [facility], [address] [place], [place] [address]    Culture  Setup Time   Final    GRAM POSITIVE COCCI IN CLUSTERS IN BOTH AEROBIC AND ANAEROBIC BOTTLES CRITICAL RESULT CALLED TO, READ BACK BY AND VERIFIED WITH: PHARMD J.FRENS AT 1497 ON 05/07/2021 BY T.SAAD. Performed at [facility], [place] [address], [place], [place] [address]    Culture STAPHYLOCOCCUS AUREUS (A)  Final   Report Status 05/09/2021 FINAL  Final   Organism ID, Bacteria STAPHYLOCOCCUS AUREUS  Final      Susceptibility   Staphylococcus aureus - MIC*    CIPROFLOXACIN >=8 RESISTANT Resistant     ERYTHROMYCIN <=0.25 SENSITIVE  Sensitive     GENTAMICIN <=0.5 SENSITIVE Sensitive     OXACILLIN <=0.25 SENSITIVE Sensitive     TETRACYCLINE <=1 SENSITIVE Sensitive     VANCOMYCIN 1 SENSITIVE Sensitive     TRIMETH/SULFA <=10 SENSITIVE Sensitive     CLINDAMYCIN <=0.25 SENSITIVE Sensitive     RIFAMPIN <=0.5 SENSITIVE Sensitive     Inducible Clindamycin NEGATIVE Sensitive     * STAPHYLOCOCCUS AUREUS  Blood Culture ID Panel (Reflexed)     Status: Abnormal   Collection Time: 05/06/21  4:15 PM  Result Value Ref Range Status   Enterococcus faecalis NOT DETECTED NOT DETECTED Final   Enterococcus Faecium NOT DETECTED NOT DETECTED Final   Listeria monocytogenes NOT DETECTED NOT DETECTED Final   Staphylococcus species DETECTED (A) NOT DETECTED Final    Comment: CRITICAL RESULT CALLED TO, READ BACK BY AND VERIFIED WITH: PHARMD J.FRENS AT 8588 ON 05/07/2021 BY T.SAAD.    Staphylococcus aureus (BCID) DETECTED (A) NOT DETECTED Final    Comment: CRITICAL RESULT CALLED TO, READ BACK BY AND VERIFIED WITH: PHARMD J.FRENS AT 5027 ON 05/07/2021 BY T.SAAD.    Staphylococcus epidermidis NOT DETECTED NOT DETECTED Final   Staphylococcus lugdunensis NOT DETECTED NOT DETECTED Final   Streptococcus species NOT DETECTED NOT DETECTED Final   Streptococcus agalactiae NOT DETECTED NOT DETECTED Final   Streptococcus pneumoniae NOT DETECTED NOT DETECTED Final   Streptococcus pyogenes NOT DETECTED NOT DETECTED Final   A.calcoaceticus-baumannii NOT DETECTED NOT DETECTED Final   Bacteroides fragilis NOT DETECTED NOT DETECTED Final   Enterobacterales NOT DETECTED NOT DETECTED Final   Enterobacter cloacae complex NOT DETECTED NOT DETECTED Final   Escherichia coli NOT DETECTED NOT DETECTED Final   Klebsiella aerogenes NOT DETECTED NOT DETECTED Final   Klebsiella oxytoca NOT DETECTED NOT DETECTED Final   Klebsiella pneumoniae NOT DETECTED NOT DETECTED Final   Proteus species NOT DETECTED NOT DETECTED Final   Salmonella species NOT DETECTED NOT  DETECTED Final   Serratia marcescens NOT DETECTED NOT DETECTED Final   Haemophilus influenzae NOT DETECTED NOT DETECTED Final   Neisseria meningitidis NOT DETECTED NOT DETECTED Final   Pseudomonas aeruginosa NOT DETECTED NOT DETECTED Final   Stenotrophomonas maltophilia NOT DETECTED NOT DETECTED Final   Candida albicans NOT DETECTED NOT DETECTED Final   Candida auris NOT DETECTED NOT DETECTED Final   Candida glabrata NOT DETECTED NOT DETECTED Final   Candida krusei NOT DETECTED NOT DETECTED Final   Candida parapsilosis  NOT DETECTED NOT DETECTED Final   Candida tropicalis NOT DETECTED NOT DETECTED Final   Cryptococcus neoformans/gattii NOT DETECTED NOT DETECTED Final   Meth resistant mecA/C and MREJ NOT DETECTED NOT DETECTED Final    Comment: Performed at [facility], [place] [address], [place], [place] [address]  Aerobic/Anaerobic Culture w Gram Stain (surgical/deep wound)     Status: None   Collection Time: 05/06/21  7:47 PM   Specimen: Toe; Wound  Result Value Ref Range Status   Specimen Description   Final    TOE Performed at [facility], [address] [place], [place] [address]    Special Requests   Final    Immunocompromised Performed at [facility], [address] [place], [place] [address]    Gram Stain   Final    NO WBC SEEN MODERATE GRAM POSITIVE COCCI IN PAIRS    Culture   Final    ABUNDANT STAPHYLOCOCCUS AUREUS MODERATE ENTEROCOCCUS FAECALIS ABUNDANT BACTEROIDES FRAGILIS BETA LACTAMASE POSITIVE Performed at [facility], [place] [address] [place], [place] [address]    Report Status 05/10/2021 FINAL  Final   Organism ID, Bacteria STAPHYLOCOCCUS AUREUS  Final   Organism ID, Bacteria ENTEROCOCCUS FAECALIS  Final      Susceptibility   Enterococcus faecalis - MIC*    AMPICILLIN <=2 SENSITIVE Sensitive     VANCOMYCIN 2 SENSITIVE Sensitive     GENTAMICIN SYNERGY SENSITIVE Sensitive     * MODERATE ENTEROCOCCUS FAECALIS    Staphylococcus aureus - MIC*    CIPROFLOXACIN >=8 RESISTANT Resistant     ERYTHROMYCIN <=0.25 SENSITIVE Sensitive     GENTAMICIN <=0.5 SENSITIVE Sensitive     OXACILLIN <=0.25 SENSITIVE Sensitive     TETRACYCLINE <=1 SENSITIVE Sensitive     VANCOMYCIN <=0.5 SENSITIVE Sensitive     TRIMETH/SULFA <=10 SENSITIVE Sensitive     CLINDAMYCIN <=0.25 SENSITIVE Sensitive     RIFAMPIN <=0.5 SENSITIVE Sensitive     Inducible Clindamycin NEGATIVE Sensitive     * ABUNDANT STAPHYLOCOCCUS AUREUS  MRSA Next Gen by PCR, Nasal     Status: None   Collection Time: 05/08/21  2:17 AM   Specimen: Nasal Mucosa; Nasal Swab  Result Value Ref Range Status   MRSA by PCR Next Gen NOT DETECTED NOT DETECTED Final    Comment: (NOTE) The GeneXpert MRSA Assay (FDA approved for NASAL specimens only), is one component of a comprehensive MRSA colonization surveillance program. It is not intended to diagnose MRSA infection nor to guide or monitor treatment for MRSA infections. Test performance is not FDA approved in patients less than 26 years old. Performed at [facility], [place] [address], [place], [place] [address]   Culture, blood (routine x 2)     Status: None (Preliminary result)   Collection Time: 05/08/21  6:11 AM   Specimen: BLOOD RIGHT HAND  Result Value Ref Range Status   Specimen Description BLOOD RIGHT HAND  Final   Special Requests   Final    BOTTLES DRAWN AEROBIC AND ANAEROBIC Blood Culture results may not be optimal due to an inadequate volume of blood received in culture bottles   Culture   Final    NO GROWTH 3 DAYS Performed at [facility], [place] [address], [place], [place] [address]    Report Status PENDING  Incomplete  Culture, blood (routine x 2)     Status: None (Preliminary result)   Collection Time: 05/08/21  6:26 AM   Specimen: BLOOD  Result Value Ref Range Status   Specimen Description BLOOD LEFT ANTECUBITAL  Final   Special Requests AEROBIC BOTTLE ONLY Blood Culture adequate  volume  Final   Culture   Final    NO GROWTH 3 DAYS Performed at [place] [address], [place], [place] [address]    Report Status PENDING  Incomplete  Aerobic/Anaerobic Culture w Gram Stain (surgical/deep wound)     Status: None (Preliminary result)   Collection Time: 05/08/21  8:10 AM   Specimen: PATH Other; Tissue  Result Value Ref Range Status   Specimen Description TISSUE  Final   Special Requests TIS FROM GREAT LEFT TOE SPEC A  Final   Gram Stain   Final    FEW WBC PRESENT, PREDOMINANTLY PMN FEW GRAM POSITIVE COCCI Performed at [facility], [place] [address], [place], [place] [address]    Culture   Final    MODERATE STAPHYLOCOCCUS AUREUS NO ANAEROBES ISOLATED; CULTURE IN PROGRESS FOR 5 DAYS    Report Status PENDING  Incomplete   Organism ID, Bacteria STAPHYLOCOCCUS AUREUS  Final      Susceptibility   Staphylococcus aureus - MIC*    CIPROFLOXACIN >=8 RESISTANT Resistant     ERYTHROMYCIN <=0.25 SENSITIVE Sensitive     GENTAMICIN <=0.5 SENSITIVE Sensitive     OXACILLIN <=0.25 SENSITIVE Sensitive     TETRACYCLINE <=1 SENSITIVE Sensitive     VANCOMYCIN 1 SENSITIVE Sensitive     TRIMETH/SULFA <=10 SENSITIVE Sensitive     CLINDAMYCIN <=0.25 SENSITIVE Sensitive     RIFAMPIN <=0.5 SENSITIVE Sensitive     Inducible Clindamycin NEGATIVE Sensitive     * MODERATE STAPHYLOCOCCUS AUREUS     Radiology Studies: No results found.   LOS: 4 days   Antonieta Pert, MD [facility]  05/11/2021, 10:23 AM

## 2021-05-11 NOTE — Progress Notes (Signed)
Pt has been giving this nurse a hard time about synthroid and Ferric citrate med. Multiple excuses about them not being on time or he already ate. They have been given on time today and pt made out that he took pills and this nurse found them on bedside table and wasted them.

## 2021-05-11 NOTE — Progress Notes (Signed)
Pt still refuses to wear Tele monitor.

## 2021-05-11 NOTE — Progress Notes (Signed)
Mobility Specialist Progress Note   05/11/21 1720  Mobility  Activity Ambulated in room  Level of Assistance Contact guard assist, steadying assist  Assistive Device Crutches  Distance Ambulated (ft) 65 ft  Mobility Ambulated independently in room  Mobility Response Tolerated well  Mobility performed by Mobility specialist  $Mobility charge 1 Mobility   Received pt in bed having no complaints and agreeable. Wanted to practice w/ crutches today and had no issue or complaint. Agreed that it is easier to rest w/ RW. Left at EOB w/ call bell in reach.   Holland Falling Mobility Specialist Phone Number [phone]

## 2021-05-11 NOTE — Progress Notes (Signed)
Pt has telemetry orders but he doesn't want his tele on due to the beeping noise it makes. He was NSR prior to taking it off. Will continue to monitor.

## 2021-05-12 ENCOUNTER — Encounter (HOSPITAL_COMMUNITY): Payer: Self-pay | Admitting: Internal Medicine

## 2021-05-12 ENCOUNTER — Encounter (HOSPITAL_COMMUNITY)
Admission: EM | Disposition: A | Discharge status: Home / Self Care | Payer: Self-pay | Source: Home / Self Care | Attending: Internal Medicine

## 2021-05-12 ENCOUNTER — Inpatient Hospital Stay (HOSPITAL_COMMUNITY): Payer: Medicaid Other | Admitting: Anesthesiology

## 2021-05-12 ENCOUNTER — Inpatient Hospital Stay (HOSPITAL_COMMUNITY): Payer: Medicaid Other

## 2021-05-12 DIAGNOSIS — Z992 Dependence on renal dialysis: Secondary | ICD-10-CM | POA: Diagnosis not present

## 2021-05-12 DIAGNOSIS — A4101 Sepsis due to Methicillin susceptible Staphylococcus aureus: Secondary | ICD-10-CM | POA: Diagnosis not present

## 2021-05-12 DIAGNOSIS — D638 Anemia in other chronic diseases classified elsewhere: Secondary | ICD-10-CM | POA: Diagnosis not present

## 2021-05-12 DIAGNOSIS — R7881 Bacteremia: Secondary | ICD-10-CM

## 2021-05-12 DIAGNOSIS — I10 Essential (primary) hypertension: Secondary | ICD-10-CM

## 2021-05-12 DIAGNOSIS — I82210 Acute embolism and thrombosis of superior vena cava: Secondary | ICD-10-CM

## 2021-05-12 DIAGNOSIS — A419 Sepsis, unspecified organism: Secondary | ICD-10-CM | POA: Diagnosis not present

## 2021-05-12 DIAGNOSIS — R6520 Severe sepsis without septic shock: Secondary | ICD-10-CM | POA: Diagnosis not present

## 2021-05-12 DIAGNOSIS — E1122 Type 2 diabetes mellitus with diabetic chronic kidney disease: Secondary | ICD-10-CM | POA: Diagnosis not present

## 2021-05-12 DIAGNOSIS — E11628 Type 2 diabetes mellitus with other skin complications: Secondary | ICD-10-CM | POA: Diagnosis not present

## 2021-05-12 DIAGNOSIS — Z794 Long term (current) use of insulin: Secondary | ICD-10-CM | POA: Diagnosis not present

## 2021-05-12 DIAGNOSIS — L089 Local infection of the skin and subcutaneous tissue, unspecified: Secondary | ICD-10-CM

## 2021-05-12 DIAGNOSIS — Z20822 Contact with and (suspected) exposure to covid-19: Secondary | ICD-10-CM | POA: Diagnosis not present

## 2021-05-12 DIAGNOSIS — N186 End stage renal disease: Secondary | ICD-10-CM | POA: Diagnosis not present

## 2021-05-12 DIAGNOSIS — E1140 Type 2 diabetes mellitus with diabetic neuropathy, unspecified: Secondary | ICD-10-CM | POA: Diagnosis not present

## 2021-05-12 DIAGNOSIS — B9561 Methicillin susceptible Staphylococcus aureus infection as the cause of diseases classified elsewhere: Secondary | ICD-10-CM | POA: Diagnosis not present

## 2021-05-12 DIAGNOSIS — N250 Renal osteodystrophy: Secondary | ICD-10-CM | POA: Diagnosis not present

## 2021-05-12 DIAGNOSIS — E1129 Type 2 diabetes mellitus with other diabetic kidney complication: Secondary | ICD-10-CM | POA: Diagnosis not present

## 2021-05-12 DIAGNOSIS — Z6841 Body Mass Index (BMI) 40.0 and over, adult: Secondary | ICD-10-CM | POA: Diagnosis not present

## 2021-05-12 DIAGNOSIS — L97529 Non-pressure chronic ulcer of other part of left foot with unspecified severity: Secondary | ICD-10-CM | POA: Diagnosis not present

## 2021-05-12 DIAGNOSIS — D631 Anemia in chronic kidney disease: Secondary | ICD-10-CM | POA: Diagnosis not present

## 2021-05-12 DIAGNOSIS — I120 Hypertensive chronic kidney disease with stage 5 chronic kidney disease or end stage renal disease: Secondary | ICD-10-CM | POA: Diagnosis not present

## 2021-05-12 HISTORY — PX: TEE WITHOUT CARDIOVERSION: SHX5443

## 2021-05-12 LAB — BASIC METABOLIC PANEL
Anion gap: 15 (ref 5–15)
BUN: 46 mg/dL — ABNORMAL HIGH (ref 6–20)
CO2: 24 mmol/L (ref 22–32)
Calcium: 9.7 mg/dL (ref 8.9–10.3)
Chloride: 97 mmol/L — ABNORMAL LOW (ref 98–111)
Creatinine, Ser: 12.87 mg/dL — ABNORMAL HIGH (ref 0.61–1.24)
GFR, Estimated: 5 mL/min — ABNORMAL LOW (ref >60)
Glucose, Bld: 111 mg/dL — ABNORMAL HIGH (ref 70–99)
Potassium: 3.7 mmol/L (ref 3.5–5.1)
Sodium: 136 mmol/L (ref 135–145)

## 2021-05-12 LAB — CBC
HCT: 40.1 % (ref 39.0–52.0)
Hemoglobin: 13.8 g/dL (ref 13.0–17.0)
MCH: 28.8 pg (ref 26.0–34.0)
MCHC: 34.4 g/dL (ref 30.0–36.0)
MCV: 83.7 fL (ref 80.0–100.0)
Platelets: 347 10*3/uL (ref 150–400)
RBC: 4.79 MIL/uL (ref 4.22–5.81)
RDW: 17.6 % — ABNORMAL HIGH (ref 11.5–15.5)
WBC: 11.1 10*3/uL — ABNORMAL HIGH (ref 4.0–10.5)
nRBC: 0 % (ref 0.0–0.2)

## 2021-05-12 LAB — GLUCOSE, CAPILLARY
Glucose-Capillary: 117 mg/dL — ABNORMAL HIGH (ref 70–99)
Glucose-Capillary: 134 mg/dL — ABNORMAL HIGH (ref 70–99)
Glucose-Capillary: 260 mg/dL — ABNORMAL HIGH (ref 70–99)
Glucose-Capillary: 111 mg/dL — ABNORMAL HIGH (ref 70–99)
Glucose-Capillary: 199 mg/dL — ABNORMAL HIGH (ref 70–99)

## 2021-05-12 LAB — ANTITHROMBIN III: AntiThromb III Func: 106 % (ref 75–120)

## 2021-05-12 LAB — HEPARIN LEVEL (UNFRACTIONATED): Heparin Unfractionated: 0.24 IU/mL — ABNORMAL LOW (ref 0.30–0.70)

## 2021-05-12 SURGERY — ECHOCARDIOGRAM, TRANSESOPHAGEAL
Anesthesia: General

## 2021-05-12 MED ORDER — HEPARIN (PORCINE) 25000 UT/250ML-% IV SOLN
2200.0000 [IU]/h | INTRAVENOUS | Status: DC
  Administered 2021-05-12: 14:00:00 1800 [IU]/h via INTRAVENOUS
  Administered 2021-05-13 (×2): 2050 [IU]/h via INTRAVENOUS
  Filled 2021-05-12 (×2): qty 250

## 2021-05-12 MED ORDER — SODIUM CHLORIDE 0.9 % IV SOLN: INTRAVENOUS | Status: DC

## 2021-05-12 MED ORDER — PROPOFOL 500 MG/50ML IV EMUL
INTRAVENOUS | Status: DC | PRN
  Administered 2021-05-12: 150 ug/kg/min via INTRAVENOUS

## 2021-05-12 MED ORDER — PROPOFOL 10 MG/ML IV BOLUS
INTRAVENOUS | Status: DC | PRN
  Administered 2021-05-12 (×6): 20 mg via INTRAVENOUS

## 2021-05-12 MED ORDER — LIDOCAINE 2% (20 MG/ML) 5 ML SYRINGE
INTRAMUSCULAR | Status: DC | PRN
  Administered 2021-05-12: 60 mg via INTRAVENOUS

## 2021-05-12 MED ORDER — BUTAMBEN-TETRACAINE-BENZOCAINE 2-2-14 % EX AERO
INHALATION_SPRAY | CUTANEOUS | Status: DC | PRN
  Administered 2021-05-12: 09:00:00 2 via TOPICAL

## 2021-05-12 MED ORDER — HEPARIN BOLUS VIA INFUSION
5000.0000 [IU] | Freq: Once | INTRAVENOUS | Status: AC
  Administered 2021-05-12: 14:00:00 5000 [IU] via INTRAVENOUS
  Filled 2021-05-12: qty 5000

## 2021-05-12 NOTE — Anesthesia Postprocedure Evaluation (Signed)
Anesthesia Post Note  Patient: Paul [name]  Procedure(s) Performed: TRANSESOPHAGEAL ECHOCARDIOGRAM (TEE)     Patient location during evaluation: PACU Anesthesia Type: General Level of consciousness: awake Pain management: pain level controlled Vital Signs Assessment: post-procedure vital signs reviewed and stable Respiratory status: spontaneous breathing Cardiovascular status: stable Postop Assessment: no apparent nausea or vomiting Anesthetic complications: no   No notable events documented.  Last Vitals:  Vitals:   05/12/21 0924 05/12/21 0938  BP: (!) 98/42 107/74  Pulse: 94 90  Resp: 13 13  Temp: 36.6 C 36.5 C  SpO2: 100% 100%    Last Pain:  Vitals:   05/12/21 0758  TempSrc: Oral  PainSc: 5                  John F Salome Arnt

## 2021-05-12 NOTE — Progress Notes (Signed)
[facility]  Date of Admission:  05/06/2021     Total days of antibiotics 7         ASSESSMENT:  Paul [name] blood cultures from 05/08/21 remain without growth to date. 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 Previous surgical cultures with enterococcus, bacteroides, and MSSA with only MSSA found in blood culture. Enterococcus and bacteroides appear localized and infection resolved with amputation. Will need 6 weeks of Cefazolin for MSSA bacteremia to be completed with dialysis. Appears safe to replace dialysis catheter as planned with cultures being negative.Remaining medical and supportive care per primary team.   PLAN:  Continue cefazolin with dialysis for 6 weeks through 06/19/21. Ok for dialysis catheter placement as planned with negative blood cultures. Post surgical wound care per orthopedics.  Remaining medical and supportive care per primary team. Follow up in the ID office.  ID will sign off.   Principal Problem:   MSSA bacteremia Active Problems:   Type 2 diabetes mellitus (HCC)   Essential hypertension, benign   ESRD on dialysis ([place])   Sepsis due to cellulitis (HCC)   Wound infection   Osteomyelitis (HCC)   Abscess    amoxicillin-clavulanate  1 tablet Oral QHS   Chlorhexidine Gluconate Cloth  6 each Topical Q0600   cinacalcet  60 mg Oral QODAY   ferric citrate  630 mg Oral TID WC   gabapentin  100 mg Oral TID   insulin aspart  0-6 Units Subcutaneous TID WC & HS   insulin glargine-yfgn  5 Units Subcutaneous Daily   levothyroxine  75 mcg Oral QAC breakfast   melatonin  3 mg Oral QHS   midodrine  5 mg Oral TID WC   multivitamin  1 tablet Oral QPM   rosuvastatin  20 mg Oral Daily   sodium chloride flush  3 mL Intravenous Q12H    SUBJECTIVE:  Afebrile overnight with no acute events. TEE without evidence of endocarditis. Feeling well. Awaiting catheter placement tomorrow.  Allergies  Allergen Reactions    Hydralazine Hcl Other (See Comments)    DRUG-INDUCED LUPUS     Review of Systems: Review of Systems  Constitutional:  Negative for chills, fever and weight loss.  Respiratory:  Negative for cough, shortness of breath and wheezing.   Cardiovascular:  Negative for chest pain and leg swelling.  Gastrointestinal:  Negative for abdominal pain, constipation, diarrhea, nausea and vomiting.  Skin:  Negative for rash.     OBJECTIVE: Vitals:   05/12/21 0924 05/12/21 0938 05/12/21 1002 05/12/21 1010  BP: (!) 98/42 107/74 93/81   Pulse: 94 90 92   Resp: 13 13 13 16   Temp: 97.8 F (36.6 C) 97.7 F (36.5 C) 97.9 F (36.6 C)   TempSrc:   Oral   SpO2: 100% 100% 100%   Weight:      Height:       Body mass index is 40.2 kg/m.  Physical Exam Constitutional:      General: He is not in acute distress.    Appearance: He is well-developed.  Cardiovascular:     Rate and Rhythm: Normal rate and regular rhythm.     Heart sounds: Normal heart sounds.  Pulmonary:     Effort: Pulmonary effort is normal.     Breath sounds: Normal breath sounds.  Skin:    General: Skin is warm and dry.  Neurological:     Mental Status: He is alert and oriented to person, place, and time.  Psychiatric:        Mood and Affect: Mood normal.    Lab Results Lab Results  Component Value Date   WBC 11.1 (H) 05/12/2021   HGB 13.8 05/12/2021   HCT 40.1 05/12/2021   MCV 83.7 05/12/2021   PLT 347 05/12/2021    Lab Results  Component Value Date   CREATININE 12.87 (H) 05/12/2021   BUN 46 (H) 05/12/2021   NA 136 05/12/2021   K 3.7 05/12/2021   CL 97 (L) 05/12/2021   CO2 24 05/12/2021    Lab Results  Component Value Date   ALT 12 05/06/2021   AST 21 05/06/2021   ALKPHOS 69 05/06/2021   BILITOT 0.7 05/06/2021     Microbiology: Recent Results (from the past 240 hour(s))  Resp Panel by RT-PCR (Flu A&B, Covid) Nasopharyngeal Swab     Status: None   Collection Time: 05/06/21  3:37 PM   Specimen:  Nasopharyngeal Swab; Nasopharyngeal(NP) swabs in vial transport medium  Result Value Ref Range Status   SARS Coronavirus 2 by RT PCR NEGATIVE NEGATIVE Final    Comment: (NOTE) SARS-CoV-2 target nucleic acids are NOT DETECTED.  The SARS-CoV-2 RNA is generally detectable in upper respiratory specimens during the acute phase of infection. The lowest concentration of SARS-CoV-2 viral copies this assay can detect is 138 copies/mL. A negative result does not preclude SARS-Cov-2 infection and should not be used as the sole basis for treatment or other patient management decisions. A negative result may occur with  improper specimen collection/handling, submission of specimen other than nasopharyngeal swab, presence of viral mutation(s) within the areas targeted by this assay, and inadequate number of viral copies(<138 copies/mL). A negative result must be combined with clinical observations, patient history, and epidemiological information. The expected result is Negative.  Fact Sheet for Patients:  EntrepreneurPulse.com.au  Fact Sheet for Healthcare Providers:  IncredibleEmployment.be  This test is no t yet approved or cleared by the [place] FDA and  has been authorized for detection and/or diagnosis of SARS-CoV-2 by FDA under an Emergency Use Authorization (EUA). This EUA will remain  in effect (meaning this test can be used) for the duration of the COVID-19 declaration under Section 564(b)(1) of the Act, 21 U.S.C.section 360bbb-3(b)(1), unless the authorization is terminated  or revoked sooner.       Influenza A by PCR NEGATIVE NEGATIVE Final   Influenza B by PCR NEGATIVE NEGATIVE Final    Comment: (NOTE) The Xpert Xpress SARS-CoV-2/FLU/RSV plus assay is intended as an aid in the diagnosis of influenza from Nasopharyngeal swab specimens and should not be used as a sole basis for treatment. Nasal washings and aspirates are unacceptable for  Xpert Xpress SARS-CoV-2/FLU/RSV testing.  Fact Sheet for Patients: EntrepreneurPulse.com.au  Fact Sheet for Healthcare Providers: IncredibleEmployment.be  This test is not yet approved or cleared by the [place] FDA and has been authorized for detection and/or diagnosis of SARS-CoV-2 by FDA under an Emergency Use Authorization (EUA). This EUA will remain in effect (meaning this test can be used) for the duration of the COVID-19 declaration under Section 564(b)(1) of the Act, 21 U.S.C. section 360bbb-3(b)(1), unless the authorization is terminated or revoked.  Performed at [facility], [address] [place], [place] [address]   Culture, blood (x 2)     Status: Abnormal   Collection Time: 05/06/21  4:00 PM   Specimen: BLOOD  Result Value Ref Range Status   Specimen Description   Final  BLOOD BLOOD RIGHT HAND Performed at [facility], [address] [place], [place] [address]    Special Requests   Final    BOTTLES DRAWN AEROBIC ONLY Blood Culture results may not be optimal due to an inadequate volume of blood received in culture bottles Performed at [facility], [address] [place], [place] [address]    Culture  Setup Time   Final    GRAM POSITIVE COCCI AEROBIC BOTTLE ONLY CRITICAL RESULT CALLED TO, READ BACK BY AND VERIFIED WITH: PHARMD GREG ABBOTT 05/08/21@4 :17 BY TW    Culture (A)  Final    STAPHYLOCOCCUS AUREUS SUSCEPTIBILITIES PERFORMED ON PREVIOUS CULTURE WITHIN THE LAST 5 DAYS. Performed at [facility], [place] [address] [place], [place] [address]    Report Status 05/09/2021 FINAL  Final  Blood Culture ID Panel (Reflexed)     Status: Abnormal   Collection Time: 05/06/21  4:00 PM  Result Value Ref Range Status   Enterococcus faecalis NOT DETECTED NOT DETECTED Final   Enterococcus Faecium NOT DETECTED NOT DETECTED Final   Listeria monocytogenes NOT DETECTED NOT DETECTED Final    Staphylococcus species DETECTED (A) NOT DETECTED Final    Comment: CRITICAL RESULT CALLED TO, READ BACK BY AND VERIFIED WITH: PHARMD GREG ABBOTT 05/08/21@4 :17 BY TW    Staphylococcus aureus (BCID) DETECTED (A) NOT DETECTED Final    Comment: CRITICAL RESULT CALLED TO, READ BACK BY AND VERIFIED WITH: PHARMD GREG ABBOTT 05/08/21@4 :17 BY TW    Staphylococcus epidermidis NOT DETECTED NOT DETECTED Final   Staphylococcus lugdunensis NOT DETECTED NOT DETECTED Final   Streptococcus species NOT DETECTED NOT DETECTED Final   Streptococcus agalactiae NOT DETECTED NOT DETECTED Final   Streptococcus pneumoniae NOT DETECTED NOT DETECTED Final   Streptococcus pyogenes NOT DETECTED NOT DETECTED Final   A.calcoaceticus-baumannii NOT DETECTED NOT DETECTED Final   Bacteroides fragilis NOT DETECTED NOT DETECTED Final   Enterobacterales NOT DETECTED NOT DETECTED Final   Enterobacter cloacae complex NOT DETECTED NOT DETECTED Final   Escherichia coli NOT DETECTED NOT DETECTED Final   Klebsiella aerogenes NOT DETECTED NOT DETECTED Final   Klebsiella oxytoca NOT DETECTED NOT DETECTED Final   Klebsiella pneumoniae NOT DETECTED NOT DETECTED Final   Proteus species NOT DETECTED NOT DETECTED Final   Salmonella species NOT DETECTED NOT DETECTED Final   Serratia marcescens NOT DETECTED NOT DETECTED Final   Haemophilus influenzae NOT DETECTED NOT DETECTED Final   Neisseria meningitidis NOT DETECTED NOT DETECTED Final   Pseudomonas aeruginosa NOT DETECTED NOT DETECTED Final   Stenotrophomonas maltophilia NOT DETECTED NOT DETECTED Final   Candida albicans NOT DETECTED NOT DETECTED Final   Candida auris NOT DETECTED NOT DETECTED Final   Candida glabrata NOT DETECTED NOT DETECTED Final   Candida krusei NOT DETECTED NOT DETECTED Final   Candida parapsilosis NOT DETECTED NOT DETECTED Final   Candida tropicalis NOT DETECTED NOT DETECTED Final   Cryptococcus neoformans/gattii NOT DETECTED NOT DETECTED Final   Meth  resistant mecA/C and MREJ NOT DETECTED NOT DETECTED Final    Comment: Performed at [facility], [address] [place], [place] [address]  Culture, blood (x 2)     Status: Abnormal   Collection Time: 05/06/21  4:15 PM   Specimen: BLOOD  Result Value Ref Range Status   Specimen Description   Final    BLOOD BLOOD RIGHT HAND Performed at [facility], [address] [place], [place] [address]  Special Requests   Final    BOTTLES DRAWN AEROBIC AND ANAEROBIC Blood Culture adequate volume Performed at [facility], [address] [place], [place] [address]    Culture  Setup Time   Final    GRAM POSITIVE COCCI IN CLUSTERS IN BOTH AEROBIC AND ANAEROBIC BOTTLES CRITICAL RESULT CALLED TO, READ BACK BY AND VERIFIED WITH: PHARMD J.FRENS AT 6045 ON 05/07/2021 BY T.SAAD. Performed at [facility], [place] [address] [place], [place] [address]    Culture STAPHYLOCOCCUS AUREUS (A)  Final   Report Status 05/09/2021 FINAL  Final   Organism ID, Bacteria STAPHYLOCOCCUS AUREUS  Final      Susceptibility   Staphylococcus aureus - MIC*    CIPROFLOXACIN >=8 RESISTANT Resistant     ERYTHROMYCIN <=0.25 SENSITIVE Sensitive     GENTAMICIN <=0.5 SENSITIVE Sensitive     OXACILLIN <=0.25 SENSITIVE Sensitive     TETRACYCLINE <=1 SENSITIVE Sensitive     VANCOMYCIN 1 SENSITIVE Sensitive     TRIMETH/SULFA <=10 SENSITIVE Sensitive     CLINDAMYCIN <=0.25 SENSITIVE Sensitive     RIFAMPIN <=0.5 SENSITIVE Sensitive     Inducible Clindamycin NEGATIVE Sensitive     * STAPHYLOCOCCUS AUREUS  Blood Culture ID Panel (Reflexed)     Status: Abnormal   Collection Time: 05/06/21  4:15 PM  Result Value Ref Range Status   Enterococcus faecalis NOT DETECTED NOT DETECTED Final   Enterococcus Faecium NOT DETECTED NOT DETECTED Final   Listeria monocytogenes NOT DETECTED NOT DETECTED Final   Staphylococcus species DETECTED (A) NOT DETECTED Final    Comment: CRITICAL RESULT CALLED TO, READ BACK  BY AND VERIFIED WITH: PHARMD J.FRENS AT 1914 ON 05/07/2021 BY T.SAAD.    Staphylococcus aureus (BCID) DETECTED (A) NOT DETECTED Final    Comment: CRITICAL RESULT CALLED TO, READ BACK BY AND VERIFIED WITH: PHARMD J.FRENS AT 7829 ON 05/07/2021 BY T.SAAD.    Staphylococcus epidermidis NOT DETECTED NOT DETECTED Final   Staphylococcus lugdunensis NOT DETECTED NOT DETECTED Final   Streptococcus species NOT DETECTED NOT DETECTED Final   Streptococcus agalactiae NOT DETECTED NOT DETECTED Final   Streptococcus pneumoniae NOT DETECTED NOT DETECTED Final   Streptococcus pyogenes NOT DETECTED NOT DETECTED Final   A.calcoaceticus-baumannii NOT DETECTED NOT DETECTED Final   Bacteroides fragilis NOT DETECTED NOT DETECTED Final   Enterobacterales NOT DETECTED NOT DETECTED Final   Enterobacter cloacae complex NOT DETECTED NOT DETECTED Final   Escherichia coli NOT DETECTED NOT DETECTED Final   Klebsiella aerogenes NOT DETECTED NOT DETECTED Final   Klebsiella oxytoca NOT DETECTED NOT DETECTED Final   Klebsiella pneumoniae NOT DETECTED NOT DETECTED Final   Proteus species NOT DETECTED NOT DETECTED Final   Salmonella species NOT DETECTED NOT DETECTED Final   Serratia marcescens NOT DETECTED NOT DETECTED Final   Haemophilus influenzae NOT DETECTED NOT DETECTED Final   Neisseria meningitidis NOT DETECTED NOT DETECTED Final   Pseudomonas aeruginosa NOT DETECTED NOT DETECTED Final   Stenotrophomonas maltophilia NOT DETECTED NOT DETECTED Final   Candida albicans NOT DETECTED NOT DETECTED Final   Candida auris NOT DETECTED NOT DETECTED Final   Candida glabrata NOT DETECTED NOT DETECTED Final   Candida krusei NOT DETECTED NOT DETECTED Final   Candida parapsilosis NOT DETECTED NOT DETECTED Final   Candida tropicalis NOT DETECTED NOT DETECTED Final   Cryptococcus neoformans/gattii NOT DETECTED NOT DETECTED Final   Meth resistant mecA/C and MREJ NOT DETECTED NOT DETECTED Final    Comment: Performed at [facility], [place] [address], [place], [place] [address]  Aerobic/Anaerobic Culture w Gram Stain (surgical/deep wound)     Status: None   Collection Time: 05/06/21  7:47 PM   Specimen: Toe; Wound  Result Value Ref Range Status   Specimen Description   Final    TOE Performed at [facility], [address] [place], [place] [address]    Special Requests   Final    Immunocompromised Performed at [facility], [address] [place], [place] [address]    Gram Stain   Final    NO WBC SEEN MODERATE GRAM POSITIVE COCCI IN PAIRS    Culture   Final    ABUNDANT STAPHYLOCOCCUS AUREUS MODERATE ENTEROCOCCUS FAECALIS ABUNDANT BACTEROIDES FRAGILIS BETA LACTAMASE POSITIVE Performed at [facility], [place] [address] [place], [place] [address]    Report Status 05/10/2021 FINAL  Final   Organism ID, Bacteria STAPHYLOCOCCUS AUREUS  Final   Organism ID, Bacteria ENTEROCOCCUS FAECALIS  Final      Susceptibility   Enterococcus faecalis - MIC*    AMPICILLIN <=2 SENSITIVE Sensitive     VANCOMYCIN 2 SENSITIVE Sensitive     GENTAMICIN SYNERGY SENSITIVE Sensitive     * MODERATE ENTEROCOCCUS FAECALIS   Staphylococcus aureus - MIC*    CIPROFLOXACIN >=8 RESISTANT Resistant     ERYTHROMYCIN <=0.25 SENSITIVE Sensitive     GENTAMICIN <=0.5 SENSITIVE Sensitive     OXACILLIN <=0.25 SENSITIVE Sensitive     TETRACYCLINE <=1 SENSITIVE Sensitive     VANCOMYCIN <=0.5 SENSITIVE Sensitive     TRIMETH/SULFA <=10 SENSITIVE Sensitive     CLINDAMYCIN <=0.25 SENSITIVE Sensitive     RIFAMPIN <=0.5 SENSITIVE Sensitive     Inducible Clindamycin NEGATIVE Sensitive     * ABUNDANT STAPHYLOCOCCUS AUREUS  MRSA Next Gen by PCR, Nasal     Status: None   Collection Time: 05/08/21  2:17 AM   Specimen: Nasal Mucosa; Nasal Swab  Result Value Ref Range Status   MRSA by PCR Next Gen NOT DETECTED NOT DETECTED Final    Comment: (NOTE) The GeneXpert MRSA Assay (FDA approved for NASAL specimens  only), is one component of a comprehensive MRSA colonization surveillance program. It is not intended to diagnose MRSA infection nor to guide or monitor treatment for MRSA infections. Test performance is not FDA approved in patients less than 74 years old. Performed at [facility], [place] [address], [place], [place] [address]   Culture, blood (routine x 2)     Status: None (Preliminary result)   Collection Time: 05/08/21  6:11 AM   Specimen: BLOOD RIGHT HAND  Result Value Ref Range Status   Specimen Description BLOOD RIGHT HAND  Final   Special Requests   Final    BOTTLES DRAWN AEROBIC AND ANAEROBIC Blood Culture results may not be optimal due to an inadequate volume of blood received in culture bottles   Culture   Final    NO GROWTH 4 DAYS Performed at [facility], [place] [address] [place], [place] [address]    Report Status PENDING  Incomplete  Culture, blood (routine x 2)     Status: None (Preliminary result)   Collection Time: 05/08/21  6:26 AM   Specimen: BLOOD  Result Value Ref Range Status   Specimen Description BLOOD LEFT ANTECUBITAL  Final   Special Requests AEROBIC BOTTLE ONLY Blood Culture adequate volume  Final   Culture   Final  NO GROWTH 4 DAYS Performed at [facility], [place] [address], [place], [place] [address]    Report Status PENDING  Incomplete  Aerobic/Anaerobic Culture w Gram Stain (surgical/deep wound)     Status: None (Preliminary result)   Collection Time: 05/08/21  8:10 AM   Specimen: PATH Other; Tissue  Result Value Ref Range Status   Specimen Description TISSUE  Final   Special Requests TIS FROM GREAT LEFT TOE SPEC A  Final   Gram Stain   Final    FEW WBC PRESENT, PREDOMINANTLY PMN FEW GRAM POSITIVE COCCI Performed at [facility], [place] [address], [place], [place] [address]    Culture   Final    MODERATE STAPHYLOCOCCUS AUREUS NO ANAEROBES ISOLATED; CULTURE IN PROGRESS FOR 5 DAYS    Report Status PENDING  Incomplete    Organism ID, Bacteria STAPHYLOCOCCUS AUREUS  Final      Susceptibility   Staphylococcus aureus - MIC*    CIPROFLOXACIN >=8 RESISTANT Resistant     ERYTHROMYCIN <=0.25 SENSITIVE Sensitive     GENTAMICIN <=0.5 SENSITIVE Sensitive     OXACILLIN <=0.25 SENSITIVE Sensitive     TETRACYCLINE <=1 SENSITIVE Sensitive     VANCOMYCIN 1 SENSITIVE Sensitive     TRIMETH/SULFA <=10 SENSITIVE Sensitive     CLINDAMYCIN <=0.25 SENSITIVE Sensitive     RIFAMPIN <=0.5 SENSITIVE Sensitive     Inducible Clindamycin NEGATIVE Sensitive     * MODERATE STAPHYLOCOCCUS AUREUS     Terri Piedra, NP [facility]  05/12/2021  3:19 PM[

## 2021-05-12 NOTE — Plan of Care (Signed)
  Problem: Activity: Goal: Risk for activity intolerance will decrease Outcome: Progressing   Problem: Nutrition: Goal: Adequate nutrition will be maintained Outcome: Progressing   

## 2021-05-12 NOTE — Progress Notes (Signed)
[place] for heparin Indication: SVC thrombus  Allergies  Allergen Reactions   Hydralazine Hcl Other (See Comments)    DRUG-INDUCED LUPUS    Patient Measurements: Height: 6\' 1"  (185.4 cm) Weight: (!) 138.2 kg (304 lb 10.8 oz) IBW/kg (Calculated) : 79.9 Heparin Dosing Weight: 111 kg  Vital Signs: Temp: 97.8 F (36.6 C) (12/21 2254) Temp Source: Oral (12/21 2254) BP: 103/62 (12/21 2254) Pulse Rate: 87 (12/21 2254)  Labs: Recent Labs    05/10/21 0331 05/11/21 0824 05/12/21 0346 05/12/21 2239  HGB 12.0* 11.3* 13.8  --   HCT 37.0* 35.0* 40.1  --   PLT 299 362 347  --   HEPARINUNFRC  --   --   --  0.24*  CREATININE  --  16.25* 12.87*  --      Estimated Creatinine Clearance: 12.5 mL/min (A) (by C-G formula based on SCr of 12.87 mg/dL (H)).   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 MD wishes to start heparin. No PTA AC noted on med list.  Heparin level subtherapeutic (0.24) on gtt at 1800 units/hr. No issues with line or bleeding reported per RN.  Goal of Therapy:  Heparin level 0.3-0.7 units/ml Monitor platelets by anticoagulation protocol: Yes   Plan: Increase heparin infusion to 2050 units/hr Will f/u 8 hr heparin level  Sherlon Handing, PharmD, BCPS Please see amion for complete clinical pharmacist phone list 05/12/2021,11:39 PM

## 2021-05-12 NOTE — Progress Notes (Addendum)
Patient ID: Paul [name], male   DOB: [birth year], 30 y.o.   MRN: [id number] S: Feeling well and wants to take a shower.   O:BP 93/81 (BP Location: Left Arm)    Pulse 92    Temp 97.9 F (36.6 C) (Oral)    Resp 13    Ht 6\' 1"  (1.854 m)    Wt (!) 138.2 kg    SpO2 100%    BMI 40.20 kg/m   Intake/Output Summary (Last 24 hours) at 05/12/2021 1048 Last data filed at 05/12/2021 0944 Gross per 24 hour  Intake 110 ml  Output 3190 ml  Net -3080 ml   Intake/Output: I/O last 3 completed shifts: In: -  Out: 3190 [Other:3190]  Intake/Output this shift:  Total I/O In: 110 [I.V.:110] Out: -  Weight change:  Gen:NAD CVS: RRR Resp: CTA Abd: +BS, soft, NT/ND Ext: no edema  Recent Labs  Lab 05/06/21 1600 05/07/21 0312 05/08/21 0621 05/11/21 0824 05/12/21 0346  NA 134* 132* 134* 133* 136  K 3.8 3.2* 3.7 3.9 3.7  CL 92* 94* 93* 97* 97*  CO2 25 23 22 22 24   GLUCOSE 163* 158* 109* 199* 111*  BUN 30* 32* 47* 63* 46*  CREATININE 11.66* 13.15* 16.36* 16.25* 12.87*  ALBUMIN 4.3  --   --  2.9*  --   CALCIUM 9.7 9.2 8.8* 8.9 9.7  PHOS  --   --   --  4.0  --   AST 21  --   --   --   --   ALT 12  --   --   --   --    Liver Function Tests: Recent Labs  Lab 05/06/21 1600 05/11/21 0824  AST 21  --   ALT 12  --   ALKPHOS 69  --   BILITOT 0.7  --   PROT 9.8*  --   ALBUMIN 4.3 2.9*   No results for input(s): LIPASE, AMYLASE in the last 168 hours. No results for input(s): AMMONIA in the last 168 hours. CBC: Recent Labs  Lab 05/06/21 1600 05/07/21 0312 05/08/21 0621 05/09/21 0232 05/10/21 0331 05/11/21 0824 05/12/21 0346  WBC 19.5*   < > 19.7* 12.6* 9.7 10.0 11.1*  NEUTROABS 15.0*  --   --   --   --   --   --   HGB 14.3   < > 11.9* 11.4* 12.0* 11.3* 13.8  HCT 43.0   < > 35.9* 35.9* 37.0* 35.0* 40.1  MCV 84.0   < > 83.7 84.3 84.3 83.7 83.7  PLT 226   < > 237 230 299 362 347   < > = values in this interval not displayed.   Cardiac Enzymes: No results for input(s): CKTOTAL, CKMB,  CKMBINDEX, TROPONINI in the last 168 hours. CBG: Recent Labs  Lab 05/11/21 1317 05/11/21 1626 05/11/21 2215 05/12/21 0614 05/12/21 0925  GLUCAP 132* 197* 126* 117* 134*    Iron Studies: No results for input(s): IRON, TIBC, TRANSFERRIN, FERRITIN in the last 72 hours. Studies/Results: IR Removal Tun Cv Cath W/O FL  Result Date: 05/11/2021 INDICATION: Patient with a history of end-stage renal disease with right IJ tunneled dialysis catheter placed in IR March 26, 2021. Patient now admitted with bacteremia. Interventional radiology asked to remove tunneled dialysis catheter for line holiday. Patient with plans for new tunneled dialysis catheter placement 05/13/2021. EXAM: REMOVAL TUNNELED CENTRAL VENOUS CATHETER MEDICATIONS: 1% lidocaine 5 mL ANESTHESIA/SEDATION: None FLUOROSCOPY TIME:  None COMPLICATIONS: None immediate. PROCEDURE: Informed  written consent was obtained from the patient after a thorough discussion of the procedural risks, benefits and alternatives. All questions were addressed. Maximal Sterile Barrier Technique was utilized including caps, mask, sterile gowns, sterile gloves, sterile drape, hand hygiene and skin antiseptic. A timeout was performed prior to the initiation of the procedure. The patient's right chest and catheter was prepped and draped in a normal sterile fashion. Heparin was removed from both ports of catheter. 1% lidocaine was used for local anesthesia. Using gentle blunt dissection and moderate manual traction the cuff of the catheter was exposed and the catheter was removed in it's entirety. Pressure was held till hemostasis was obtained. A sterile dressing was applied. The patient tolerated the procedure well with no immediate complications. IMPRESSION: Successful catheter removal as described above. Read by: Soyla Dryer, NP Electronically Signed   By: Sandi Mariscal M.D.   On: 05/11/2021 16:40    amoxicillin-clavulanate  1 tablet Oral QHS   Chlorhexidine  Gluconate Cloth  6 each Topical Q0600   cinacalcet  60 mg Oral QODAY   ferric citrate  630 mg Oral TID WC   gabapentin  100 mg Oral TID   heparin  5,000 Units Subcutaneous Q8H   insulin aspart  0-6 Units Subcutaneous TID WC & HS   insulin glargine-yfgn  5 Units Subcutaneous Daily   levothyroxine  75 mcg Oral QAC breakfast   melatonin  3 mg Oral QHS   midodrine  5 mg Oral TID WC   multivitamin  1 tablet Oral QPM   rosuvastatin  20 mg Oral Daily   sodium chloride flush  3 mL Intravenous Q12H    BMET    Component Value Date/Time   NA 136 05/12/2021 0346   NA 139 06/14/2019 0914   K 3.7 05/12/2021 0346   CL 97 (L) 05/12/2021 0346   CO2 24 05/12/2021 0346   GLUCOSE 111 (H) 05/12/2021 0346   BUN 46 (H) 05/12/2021 0346   BUN 31 (H) 06/14/2019 0914   CREATININE 12.87 (H) 05/12/2021 0346   CREATININE 1.85 (H) 11/28/2014 1153   CALCIUM 9.7 05/12/2021 0346   GFRNONAA 5 (L) 05/12/2021 0346   GFRNONAA 51 (L) 11/28/2014 1153   GFRAA 6 (L) 06/14/2019 0914   GFRAA 58 (L) 11/28/2014 1153   CBC    Component Value Date/Time   WBC 11.1 (H) 05/12/2021 0346   RBC 4.79 05/12/2021 0346   HGB 13.8 05/12/2021 0346   HCT 40.1 05/12/2021 0346   PLT 347 05/12/2021 0346   MCV 83.7 05/12/2021 0346   MCH 28.8 05/12/2021 0346   MCHC 34.4 05/12/2021 0346   RDW 17.6 (H) 05/12/2021 0346   LYMPHSABS 2.4 05/06/2021 1600   MONOABS 1.7 (H) 05/06/2021 1600   EOSABS 0.1 05/06/2021 1600   BASOSABS 0.1 05/06/2021 1600    [facility]: [facility]   4.5h   500/1.5   145kg  2/2 bath P2 TDC  Hep 8000  - hect 5ug tiw  - no esa   Assessment/Plan: Sepsis/ L foot osteo - w/ ^WBC, hypotension. 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  WBC improving down to 9.7.   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 [name], per ID will need a line holiday.   [place] removed 05/11/21 and to have new HD catheter placed 05/13/21 by IR. 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  ESRD - on HD TTS. Continue on schedule.  Plans to go to sister's house in [place] after discharge --will need transient placement at [facility]. Contact renal navigator for assistance on Monday.  HD access - on HD 3 yrs. 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 Supposed to see [facility] soon about next access. Using [facility] now.  HTN - BP's soft likely due to infection. Better today.  DM2 - on insulin.  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 Cont sensipar and [place].  Anemia ckd - Hb >10, no esa at OP unit. Follow.   Donetta Potts, MD [employer] [phone]

## 2021-05-12 NOTE — Anesthesia Preprocedure Evaluation (Addendum)
Anesthesia Evaluation  Patient identified by MRN, date of birth, ID band Patient awake    Reviewed: Allergy & Precautions, H&P , NPO status , Patient's Chart, lab work & pertinent test results  Airway Mallampati: II   Neck ROM: full    Dental  (+) Poor Dentition, Dental Advisory Given   Pulmonary Patient abstained from smoking.,    breath sounds clear to auscultation       Cardiovascular hypertension,  Rhythm:regular Rate:Normal     Neuro/Psych  Headaches,    GI/Hepatic GERD  ,  Endo/Other  diabetesHypothyroidism Morbid obesity  Renal/GU ESRF and DialysisRenal disease     Musculoskeletal   Abdominal (+) + obese,   Peds  Hematology   Anesthesia Other Findings ventricular ejection fraction, by estimation, is 60 to 65%. The left ventricle has normal function. The left ventricle has no regional wall motion abnormalities. Left ventricular diastolic parameters were normal. 1. 2. Right ventricular systolic function is normal. The right ventricular size is normal. The mitral valve is normal in structure. No evidence of mitral valve regurgitation. No evidence of mitral stenosis. 3. The aortic valve is normal in structure. Aortic valve regurgitation is not visualized. No aortic stenosis is present. 4. The inferior vena cava is normal in size with greater than 50% respiratory variability, suggesting right atrial pressure of 3 mmHg. 5. Conclusion(s)/Recommendation(s): No evidence of valvular vegetations on this transthoracic echocardiogram. Consider a transesophageal echocardiogram to exclude infective endocarditis if clinically indicated  Reproductive/Obstetrics                          Anesthesia Physical  Anesthesia Plan  ASA: 3  Anesthesia Plan: MAC   Post-op Pain Management:    Induction: Intravenous  PONV Risk Score and Plan: 2 and Ondansetron, Treatment may vary due to age or medical  condition, Midazolam and Propofol infusion  Airway Management Planned: Mask and Natural Airway  Additional Equipment: None  Intra-op Plan:   Post-operative Plan: Extubation in OR  Informed Consent: I have reviewed the patients History and Physical, chart, labs and discussed the procedure including the risks, benefits and alternatives for the proposed anesthesia with the patient or authorized representative who has indicated his/her understanding and acceptance.     Dental advisory given  Plan Discussed with: CRNA  Anesthesia Plan Comments:        Anesthesia Quick Evaluation

## 2021-05-12 NOTE — Progress Notes (Signed)
°  Echocardiogram Echocardiogram Transesophageal has been performed.  Paul [name] 05/12/2021, 9:32 AM

## 2021-05-12 NOTE — Progress Notes (Signed)
Pt has been cleared to start at [facility] at d/c. Pt will be on a TTS schedule. Pt appt times will be determined according when pt stable for d/c and when pt to start at clinic due to up coming holiday. Clinic will provide those times once d/c date is known. Clinic will also need orders at d/c. Spoke to pt via phone to provide update regarding the above. Also advised TOC staff that pt's sister was inquiring about possible transportation options while pt staying with sister. Will follow and assist.    Melven Sartorius Renal Navigator [phone]

## 2021-05-12 NOTE — Anesthesia Procedure Notes (Signed)
Procedure Name: MAC Date/Time: 05/12/2021 8:36 AM Performed by: Jenne Campus, CRNA Pre-anesthesia Checklist: Patient identified, Emergency Drugs available, Suction available and Patient being monitored Oxygen Delivery Method: Simple face mask

## 2021-05-12 NOTE — Plan of Care (Signed)

## 2021-05-12 NOTE — Progress Notes (Signed)
PHARMACY CONSULT NOTE FOR:  OUTPATIENT  PARENTERAL ANTIBIOTIC THERAPY (OPAT)  Indication: MSSA bacteremia Regimen: Cefazolin 2g/HD-TTS End date: 06/18/21  Informational purposes only. No antibiotic orders will be pended as these will be administered with the patient's HD session. Nephrology has been made aware of the antibiotic regimen and stop date.   Thank you for allowing pharmacy to be a part of this patients care.  Alycia Rossetti, PharmD, BCPS Clinical Pharmacist 05/12/2021 1:47 PM   **Pharmacist phone directory can now be found on [email or web address] ([email or web address]).  Listed under [place].

## 2021-05-12 NOTE — CV Procedure (Addendum)
° ° °  TRANSESOPHAGEAL ECHOCARDIOGRAM   NAME:  Paul [name]    MRN: [id number] DOB:  1990/11/03    ADMIT DATE: 05/06/2021  INDICATIONS: Infective endocarditis eval  PROCEDURE:   Informed consent was obtained prior to the procedure. The risks, benefits and alternatives for the procedure were discussed and the patient comprehended these risks.  Risks include, but are not limited to, cough, sore throat, vomiting, nausea, somnolence, esophageal and stomach trauma or perforation, bleeding, low blood pressure, aspiration, pneumonia, infection, trauma to the teeth and death.    Procedural time out performed. The oropharynx was anesthetized with topical 1% benzocaine.    Anesthesia was administered by Dr. Jillyn Hidden and team.  The patient was administered a total of Propofol 617 mg, Lidocaine 60 mg to achieve and maintain moderate to deep conscious sedation.  The patient's heart rate, blood pressure, and oxygen saturation are monitored continuously during the procedure. The period of conscious sedation is 17 minutes, of which I was present face-to-face 100% of this time.   The transesophageal probe was inserted in the esophagus and stomach without difficulty and multiple views were obtained.   COMPLICATIONS:    There were no immediate complications.  KEY FINDINGS:  SVC thrombus.  Full report to follow. Further management per primary team.   Rudean Haskell, MD [place]  9:17 AM  Updated patient and attempted to reach family.  Reached out to [facility] team and ID.  Rudean Haskell, MD [address], [address] [place], [place] [address] [phone]  9:39 AM

## 2021-05-12 NOTE — Transfer of Care (Signed)
Immediate Anesthesia Transfer of Care Note  Patient: Paul [name]  Procedure(s) Performed: TRANSESOPHAGEAL ECHOCARDIOGRAM (TEE)  Patient Location: PACU  Anesthesia Type:MAC  Level of Consciousness: awake, oriented and patient cooperative  Airway & Oxygen Therapy: Patient Spontanous Breathing and Patient connected to face mask oxygen  Post-op Assessment: Report given to RN and Post -op Vital signs reviewed and stable  Post vital signs: Reviewed  Last Vitals:  Vitals Value Taken Time  BP 98/42 05/12/21 0924  Temp 36.6 C 05/12/21 0924  Pulse 93 05/12/21 0926  Resp 15 05/12/21 0926  SpO2 100 % 05/12/21 0926  Vitals shown include unvalidated device data.  Last Pain:  Vitals:   05/12/21 0758  TempSrc: Oral  PainSc: 5          Complications: No notable events documented.

## 2021-05-12 NOTE — Progress Notes (Signed)
PROGRESS NOTE    Paul [name]  OVF:[id number] DOB: 1991-05-14 DOA: 05/06/2021 PCP: Orvis Brill, DO    Brief Narrative/Hospital Course: Paul [name], 30 y.o. male with PMH of ESRD and HD TTS, T2DM, morbid obesity bmi 43, HTN, HLD, anemia of chronic disease, hypothyroidism presented with wound on the left great toe along with redness, pain initially started as a blister and ulcer on the bottom of the left great toe few weeks ago and got worse in last 2 to 3 days. He was seen in the ED systolic blood pressure insulin 80s given IV fluid boluses CT scan of the fluid no periosteal reaction MRI recommended, also had leukocytosis lactic acidosis abnormal LFTs and concern about severe sepsis PCCM was consulted and after discussion transferred under hospitalist for admission for severe sepsis. 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  Denies any chest pain shortness of breath.  No nausea vomiting.  His sister and mom are at the bedside.  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 Culture polymicrobial. Blood culture MSSA. Repeat blood culture negative ID recommended line holiday.  Dialysis catheter was removed on 12/20. Currently noted to be on Augmentin and Ancef. 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  Likely related to his dialysis catheter. Start IV heparin.  This was discussed with patient.     T2DM on insulin with CKD and diabetic neuropathy Stable for the most part.  HbA1c 6.0.  Continue  Lantus.  SSI.   He was started on Neurontin for neuropathy.    Hypotension Noted to be on midodrine.  Hyperlipidemia Continue statin.    ESRD and HD TTS/metabolic bone disease Nephrology is following.  Dialysis catheter removed yesterday for line holiday.  SVC thrombus could create issues with the new access.   Anemia of chronic disease Hemoglobin is stable for the most part.    Hypothyroidism Continue levothyroxine.  Class III obesity Estimated body mass index is 40.2 kg/m as calculated from the following:   Height as of this encounter: 6\' 1"  (1.854 m).   Weight as of this encounter: 138.2 kg.   DVT prophylaxis: heparin injection 5,000 Units Start: 05/07/21 1400 Code Status:   Code Status: Full Code Family Communication: Discussed with patient and sister at bedside Disposition: Hopefully return home when improved  Status is: Inpatient Remains inpatient appropriate because: For IV antibiotics and management of osteomyelitis  Objective: Vitals last 24 hrs: Vitals:   05/12/21 0758 05/12/21 0924 05/12/21 0938 05/12/21 1002  BP: (!) 124/93 (!) 98/42 107/74 93/81  Pulse: 86 94 90 92  Resp: 17 13 13 13   Temp: (!) 97 F (36.1 C) 97.8 F (36.6 C) 97.7 F (36.5 C) 97.9 F (36.6 C)  TempSrc: Oral   Oral  SpO2: 100% 100% 100% 100%  Weight: (!) 138.2 kg     Height: 6\' 1"  (1.854 m)      Weight change:   Intake/Output Summary (Last 24 hours) at 05/12/2021 1146 Last data filed at 05/12/2021 0944 Gross per 24 hour  Intake 110 ml  Output 3190  ml  Net -3080 ml    Net IO Since Admission: -1,627.06 mL [05/12/21 1146]   Physical Examination:  General appearance: Awake alert.  In no distress Resp: Clear to auscultation bilaterally.  Normal effort Cardio: S1-S2 is normal regular.  No S3-S4.  No rubs murmurs or bruit GI: Abdomen is soft.  Nontender nondistended.  Bowel sounds are present normal.  No masses organomegaly Extremities: Left foot covered in  dressing Neurologic: Alert and oriented x3.  No focal neurological deficits.      Medications reviewed:  Scheduled Meds:  amoxicillin-clavulanate  1 tablet Oral QHS   Chlorhexidine Gluconate Cloth  6 each Topical Q0600   cinacalcet  60 mg Oral QODAY   ferric citrate  630 mg Oral TID WC   gabapentin  100 mg Oral TID   heparin  5,000 Units Subcutaneous Q8H   insulin aspart  0-6 Units Subcutaneous TID WC & HS   insulin glargine-yfgn  5 Units Subcutaneous Daily   levothyroxine  75 mcg Oral QAC breakfast   melatonin  3 mg Oral QHS   midodrine  5 mg Oral TID WC   multivitamin  1 tablet Oral QPM   rosuvastatin  20 mg Oral Daily   sodium chloride flush  3 mL Intravenous Q12H  Continuous Infusions:  sodium chloride      ceFAZolin (ANCEF) IV 2 g (05/12/21 0032)   Diet Order             Diet renal/carb modified with fluid restriction Diet-HS Snack? Nothing; Fluid restriction: 1200 mL Fluid; Room service appropriate? Yes; Fluid consistency: Thin  Diet effective now                 Weight change:   Wt Readings from Last 3 Encounters:  05/12/21 (!) 138.2 kg  03/26/21 (!) 149.7 kg  03/17/21 (!) 151 kg   Consultants:see note   Procedures:see note  Antimicrobials: Anti-infectives (From admission, onward)    Start     Dose/Rate Route Frequency Ordered Stop   05/13/21 1000  ceFAZolin (ANCEF) IVPB 2g/100 mL premix  Status:  Discontinued        2 g 200 mL/hr over 30 Minutes Intravenous To Radiology 05/11/21 1120 05/11/21 1210   05/13/21 1000  ceFAZolin (ANCEF) IVPB 1 g/50 mL premix  Status:  Discontinued        1 g 100 mL/hr over 30 Minutes Intravenous To Radiology 05/11/21 1211 05/12/21 0954   05/10/21 2200  amoxicillin-clavulanate (AUGMENTIN) 500-125 MG per tablet 500 mg        1 tablet Oral Daily at bedtime 05/10/21 1529 05/15/21 2159   05/08/21 1800  ceFAZolin (ANCEF) IVPB 2g/100 mL premix        2 g 200 mL/hr over 30 Minutes Intravenous Every T-Th-Sa (1800) 05/07/21 1239      05/08/21 1600  amoxicillin (AMOXIL) capsule 500 mg  Status:  Discontinued        500 mg Oral Every 24 hours 05/08/21 1535 05/10/21 1522   05/08/21 1200  vancomycin (VANCOCIN) IVPB 1000 mg/200 mL premix  Status:  Discontinued        1,000 mg 200 mL/hr over 60 Minutes Intravenous Every T-Th-Sa (Hemodialysis) 05/07/21 0233 05/07/21 1239   05/08/21 0600  ceFAZolin (ANCEF) IVPB 3g/100 mL premix        3 g 200 mL/hr over 30 Minutes Intravenous On call to O.R. 05/07/21 1857 05/08/21 0755   05/07/21 1530  ceFAZolin (ANCEF) IVPB 2g/100 mL premix  Status:  Discontinued        2 g 200 mL/hr over 30 Minutes Intravenous To Radiology 05/07/21 1436 05/07/21 1615   05/06/21 1700  vancomycin (VANCOCIN) IVPB 1000 mg/200 mL premix        1,000 mg 200 mL/hr over 60 Minutes Intravenous Every 1 hr x 2 05/06/21 1621 05/06/21 2005   05/06/21 1630  ceFEPIme (MAXIPIME) 2 g in sodium chloride 0.9 % 100 mL IVPB        2 g 200 mL/hr over 30 Minutes Intravenous  Once 05/06/21 1621 05/06/21 1701   05/06/21 1630  metroNIDAZOLE (FLAGYL) IVPB 500 mg        500 mg 100 mL/hr over 60 Minutes Intravenous  Once 05/06/21 1621 05/06/21 1728   05/06/21 1615  piperacillin-tazobactam (ZOSYN) IVPB 2.25 g  Status:  Discontinued        2.25 g 100 mL/hr over 30 Minutes Intravenous  Once 05/06/21 1614 05/06/21 1621      Culture/Microbiology    Component Value Date/Time   SDES TISSUE 05/08/2021 0810   SPECREQUEST TIS FROM GREAT LEFT TOE SPEC A 05/08/2021 0810   CULT  05/08/2021 0810    MODERATE STAPHYLOCOCCUS AUREUS NO ANAEROBES ISOLATED; CULTURE IN PROGRESS FOR 5 DAYS    REPTSTATUS PENDING 05/08/2021 0300    Other culture-see note  Unresulted Labs (From admission, onward)     Start     Ordered   05/12/21 1053  Antithrombin III  (Hypercoagulable Panel, Comprehensive (PNL))  Once,   R       Question:  Specimen collection method  Answer:  Lab=Lab collect   05/12/21 1052   05/12/21 1053  Protein C activity   (Hypercoagulable Panel, Comprehensive (PNL))  Once,   R       Question:  Specimen collection method  Answer:  Lab=Lab collect   05/12/21 1052   05/12/21 1053  Protein C, total  (Hypercoagulable Panel, Comprehensive (PNL))  Once,   R       Question:  Specimen collection method  Answer:  Lab=Lab collect   05/12/21 1052   05/12/21 1053  Protein S activity  (Hypercoagulable Panel, Comprehensive (PNL))  Once,   R       Question:  Specimen collection method  Answer:  Lab=Lab collect   05/12/21 1052   05/12/21 1053  Protein S, total  (Hypercoagulable Panel, Comprehensive (PNL))  Once,   R       Question:  Specimen collection method  Answer:  Lab=Lab collect   05/12/21 1052   05/12/21 1053  Lupus anticoagulant panel  (Hypercoagulable Panel, Comprehensive (PNL))  Once,   R       Question:  Specimen collection method  Answer:  Lab=Lab collect   05/12/21 1052   05/12/21 1053  Beta-2-glycoprotein i abs, IgG/M/A  (Hypercoagulable Panel, Comprehensive (PNL))  Once,   R       Question:  Specimen collection method  Answer:  Lab=Lab collect   05/12/21 1052   05/12/21 1053  Homocysteine, serum  (Hypercoagulable Panel, Comprehensive (PNL))  Once,   R       Question:  Specimen collection method  Answer:  Lab=Lab collect   05/12/21 1052   05/12/21 1053  Factor 5 leiden  (Hypercoagulable Panel, Comprehensive (PNL))  Once,   R       Question:  Specimen collection method  Answer:  Lab=Lab collect   05/12/21 1052   05/12/21 1053  Prothrombin gene mutation  (Hypercoagulable Panel, Comprehensive (PNL))  Once,  R       Question:  Specimen collection method  Answer:  Lab=Lab collect   05/12/21 1052   05/12/21 1053  Cardiolipin antibodies, IgG, IgM, IgA  (Hypercoagulable Panel, Comprehensive (PNL))  Once,   R       Question:  Specimen collection method  Answer:  Lab=Lab collect   05/12/21 1052   05/06/21 1602  Urinalysis, Routine w reflex microscopic  (Septic presentation on arrival (screening labs, nursing and  treatment orders for obvious sepsis))  ONCE - STAT,   STAT        05/06/21 1605   Signed and Held  Renal function panel  Once,   R       Question:  Specimen collection method  Answer:  Lab=Lab collect   Signed and Held   Signed and Held  CBC  Once,   R       Question:  Specimen collection method  Answer:  Lab=Lab collect   Signed and Held           Data Reviewed: I have personally reviewed following labs and imaging studies  CBC: Recent Labs  Lab 05/06/21 1600 05/07/21 0312 05/08/21 0621 05/09/21 0232 05/10/21 0331 05/11/21 0824 05/12/21 0346  WBC 19.5*   < > 19.7* 12.6* 9.7 10.0 11.1*  NEUTROABS 15.0*  --   --   --   --   --   --   HGB 14.3   < > 11.9* 11.4* 12.0* 11.3* 13.8  HCT 43.0   < > 35.9* 35.9* 37.0* 35.0* 40.1  MCV 84.0   < > 83.7 84.3 84.3 83.7 83.7  PLT 226   < > 237 230 299 362 347   < > = values in this interval not displayed.    Basic Metabolic Panel: Recent Labs  Lab 05/06/21 1600 05/07/21 0312 05/08/21 0621 05/11/21 0824 05/12/21 0346  NA 134* 132* 134* 133* 136  K 3.8 3.2* 3.7 3.9 3.7  CL 92* 94* 93* 97* 97*  CO2 25 23 22 22 24   GLUCOSE 163* 158* 109* 199* 111*  BUN 30* 32* 47* 63* 46*  CREATININE 11.66* 13.15* 16.36* 16.25* 12.87*  CALCIUM 9.7 9.2 8.8* 8.9 9.7  PHOS  --   --   --  4.0  --     GFR: Estimated Creatinine Clearance: 12.5 mL/min (A) (by C-G formula based on SCr of 12.87 mg/dL (H)). Liver Function Tests: Recent Labs  Lab 05/06/21 1600 05/11/21 0824  AST 21  --   ALT 12  --   ALKPHOS 69  --   BILITOT 0.7  --   PROT 9.8*  --   ALBUMIN 4.3 2.9*    Coagulation Profile: Recent Labs  Lab 05/06/21 1600  INR 1.3*      CBG: Recent Labs  Lab 05/11/21 1317 05/11/21 1626 05/11/21 2215 05/12/21 0614 05/12/21 0925  GLUCAP 132* 197* 126* 117* 134*     Sepsis Labs: Recent Labs  Lab 05/06/21 1600 05/06/21 1740 05/07/21 0312 05/07/21 0610  LATICACIDVEN 2.3* 2.0* 1.0 1.2     Recent Results (from the past  240 hour(s))  Resp Panel by RT-PCR (Flu A&B, Covid) Nasopharyngeal Swab     Status: None   Collection Time: 05/06/21  3:37 PM   Specimen: Nasopharyngeal Swab; Nasopharyngeal(NP) swabs in vial transport medium  Result Value Ref Range Status   SARS Coronavirus 2 by RT PCR NEGATIVE NEGATIVE Final    Comment: (NOTE) SARS-CoV-2 target nucleic acids are NOT DETECTED.  The SARS-CoV-2 RNA is generally detectable in upper respiratory specimens during the acute phase of infection. The lowest concentration of SARS-CoV-2 viral copies this assay can detect is 138 copies/mL. A negative result does not preclude SARS-Cov-2 infection and should not be used as the sole basis for treatment or other patient management decisions. A negative result may occur with  improper specimen collection/handling, submission of specimen other than nasopharyngeal swab, presence of viral mutation(s) within the areas targeted by this assay, and inadequate number of viral copies(<138 copies/mL). A negative result must be combined with clinical observations, patient history, and epidemiological information. The expected result is Negative.  Fact Sheet for Patients:  EntrepreneurPulse.com.au  Fact Sheet for Healthcare Providers:  IncredibleEmployment.be  This test is no t yet approved or cleared by the [place] FDA and  has been authorized for detection and/or diagnosis of SARS-CoV-2 by FDA under an Emergency Use Authorization (EUA). This EUA will remain  in effect (meaning this test can be used) for the duration of the COVID-19 declaration under Section 564(b)(1) of the Act, 21 U.S.C.section 360bbb-3(b)(1), unless the authorization is terminated  or revoked sooner.       Influenza A by PCR NEGATIVE NEGATIVE Final   Influenza B by PCR NEGATIVE NEGATIVE Final    Comment: (NOTE) The Xpert Xpress SARS-CoV-2/FLU/RSV plus assay is intended as an aid in the diagnosis of influenza  from Nasopharyngeal swab specimens and should not be used as a sole basis for treatment. Nasal washings and aspirates are unacceptable for Xpert Xpress SARS-CoV-2/FLU/RSV testing.  Fact Sheet for Patients: EntrepreneurPulse.com.au  Fact Sheet for Healthcare Providers: IncredibleEmployment.be  This test is not yet approved or cleared by the [place] FDA and has been authorized for detection and/or diagnosis of SARS-CoV-2 by FDA under an Emergency Use Authorization (EUA). This EUA will remain in effect (meaning this test can be used) for the duration of the COVID-19 declaration under Section 564(b)(1) of the Act, 21 U.S.C. section 360bbb-3(b)(1), unless the authorization is terminated or revoked.  Performed at [facility], [address] [place], [place] [address]   Culture, blood (x 2)     Status: Abnormal   Collection Time: 05/06/21  4:00 PM   Specimen: BLOOD  Result Value Ref Range Status   Specimen Description   Final    BLOOD BLOOD RIGHT HAND Performed at [facility], [address] [place], [place] [address]    Special Requests   Final    BOTTLES DRAWN AEROBIC ONLY Blood Culture results may not be optimal due to an inadequate volume of blood received in culture bottles Performed at [facility], [address] [place], [place] [address]    Culture  Setup Time   Final    GRAM POSITIVE COCCI AEROBIC BOTTLE ONLY CRITICAL RESULT CALLED TO, READ BACK BY AND VERIFIED WITH: PHARMD GREG ABBOTT 05/08/21@4 :17 BY TW    Culture (A)  Final    STAPHYLOCOCCUS AUREUS SUSCEPTIBILITIES PERFORMED ON PREVIOUS CULTURE WITHIN THE LAST 5 DAYS. Performed at [facility], [place] [address], [place], [place] [address]    Report Status 05/09/2021 FINAL  Final  Blood Culture ID Panel (Reflexed)     Status: Abnormal   Collection Time: 05/06/21  4:00 PM  Result Value Ref Range Status   Enterococcus faecalis NOT  DETECTED NOT DETECTED Final   Enterococcus Faecium NOT DETECTED NOT DETECTED Final   Listeria monocytogenes NOT DETECTED NOT DETECTED Final  Staphylococcus species DETECTED (A) NOT DETECTED Final    Comment: CRITICAL RESULT CALLED TO, READ BACK BY AND VERIFIED WITH: PHARMD GREG ABBOTT 05/08/21@4 :17 BY TW    Staphylococcus aureus (BCID) DETECTED (A) NOT DETECTED Final    Comment: CRITICAL RESULT CALLED TO, READ BACK BY AND VERIFIED WITH: PHARMD GREG ABBOTT 05/08/21@4 :17 BY TW    Staphylococcus epidermidis NOT DETECTED NOT DETECTED Final   Staphylococcus lugdunensis NOT DETECTED NOT DETECTED Final   Streptococcus species NOT DETECTED NOT DETECTED Final   Streptococcus agalactiae NOT DETECTED NOT DETECTED Final   Streptococcus pneumoniae NOT DETECTED NOT DETECTED Final   Streptococcus pyogenes NOT DETECTED NOT DETECTED Final   A.calcoaceticus-baumannii NOT DETECTED NOT DETECTED Final   Bacteroides fragilis NOT DETECTED NOT DETECTED Final   Enterobacterales NOT DETECTED NOT DETECTED Final   Enterobacter cloacae complex NOT DETECTED NOT DETECTED Final   Escherichia coli NOT DETECTED NOT DETECTED Final   Klebsiella aerogenes NOT DETECTED NOT DETECTED Final   Klebsiella oxytoca NOT DETECTED NOT DETECTED Final   Klebsiella pneumoniae NOT DETECTED NOT DETECTED Final   Proteus species NOT DETECTED NOT DETECTED Final   Salmonella species NOT DETECTED NOT DETECTED Final   Serratia marcescens NOT DETECTED NOT DETECTED Final   Haemophilus influenzae NOT DETECTED NOT DETECTED Final   Neisseria meningitidis NOT DETECTED NOT DETECTED Final   Pseudomonas aeruginosa NOT DETECTED NOT DETECTED Final   Stenotrophomonas maltophilia NOT DETECTED NOT DETECTED Final   Candida albicans NOT DETECTED NOT DETECTED Final   Candida auris NOT DETECTED NOT DETECTED Final   Candida glabrata NOT DETECTED NOT DETECTED Final   Candida krusei NOT DETECTED NOT DETECTED Final   Candida parapsilosis NOT DETECTED NOT  DETECTED Final   Candida tropicalis NOT DETECTED NOT DETECTED Final   Cryptococcus neoformans/gattii NOT DETECTED NOT DETECTED Final   Meth resistant mecA/C and MREJ NOT DETECTED NOT DETECTED Final    Comment: Performed at [facility], [address], [place], [place] [address]  Culture, blood (x 2)     Status: Abnormal   Collection Time: 05/06/21  4:15 PM   Specimen: BLOOD  Result Value Ref Range Status   Specimen Description   Final    BLOOD BLOOD RIGHT HAND Performed at [facility], [address] [place], [place] [address]    Special Requests   Final    BOTTLES DRAWN AEROBIC AND ANAEROBIC Blood Culture adequate volume Performed at [facility], [address] [place], [place] [address]    Culture  Setup Time   Final    GRAM POSITIVE COCCI IN CLUSTERS IN BOTH AEROBIC AND ANAEROBIC BOTTLES CRITICAL RESULT CALLED TO, READ BACK BY AND VERIFIED WITH: PHARMD J.FRENS AT 5701 ON 05/07/2021 BY T.SAAD. Performed at [facility], [place] [address], [place], [place] [address]    Culture STAPHYLOCOCCUS AUREUS (A)  Final   Report Status 05/09/2021 FINAL  Final   Organism ID, Bacteria STAPHYLOCOCCUS AUREUS  Final      Susceptibility   Staphylococcus aureus - MIC*    CIPROFLOXACIN >=8 RESISTANT Resistant     ERYTHROMYCIN <=0.25 SENSITIVE Sensitive     GENTAMICIN <=0.5 SENSITIVE Sensitive     OXACILLIN <=0.25 SENSITIVE Sensitive     TETRACYCLINE <=1 SENSITIVE Sensitive     VANCOMYCIN 1 SENSITIVE Sensitive     TRIMETH/SULFA <=10 SENSITIVE Sensitive     CLINDAMYCIN <=0.25 SENSITIVE Sensitive     RIFAMPIN <=0.5 SENSITIVE Sensitive     Inducible  Clindamycin NEGATIVE Sensitive     * STAPHYLOCOCCUS AUREUS  Blood Culture ID Panel (Reflexed)     Status: Abnormal   Collection Time: 05/06/21  4:15 PM  Result Value Ref Range Status   Enterococcus faecalis NOT DETECTED NOT DETECTED Final   Enterococcus Faecium NOT DETECTED NOT DETECTED Final   Listeria  monocytogenes NOT DETECTED NOT DETECTED Final   Staphylococcus species DETECTED (A) NOT DETECTED Final    Comment: CRITICAL RESULT CALLED TO, READ BACK BY AND VERIFIED WITH: PHARMD J.FRENS AT 2355 ON 05/07/2021 BY T.SAAD.    Staphylococcus aureus (BCID) DETECTED (A) NOT DETECTED Final    Comment: CRITICAL RESULT CALLED TO, READ BACK BY AND VERIFIED WITH: PHARMD J.FRENS AT 7322 ON 05/07/2021 BY T.SAAD.    Staphylococcus epidermidis NOT DETECTED NOT DETECTED Final   Staphylococcus lugdunensis NOT DETECTED NOT DETECTED Final   Streptococcus species NOT DETECTED NOT DETECTED Final   Streptococcus agalactiae NOT DETECTED NOT DETECTED Final   Streptococcus pneumoniae NOT DETECTED NOT DETECTED Final   Streptococcus pyogenes NOT DETECTED NOT DETECTED Final   A.calcoaceticus-baumannii NOT DETECTED NOT DETECTED Final   Bacteroides fragilis NOT DETECTED NOT DETECTED Final   Enterobacterales NOT DETECTED NOT DETECTED Final   Enterobacter cloacae complex NOT DETECTED NOT DETECTED Final   Escherichia coli NOT DETECTED NOT DETECTED Final   Klebsiella aerogenes NOT DETECTED NOT DETECTED Final   Klebsiella oxytoca NOT DETECTED NOT DETECTED Final   Klebsiella pneumoniae NOT DETECTED NOT DETECTED Final   Proteus species NOT DETECTED NOT DETECTED Final   Salmonella species NOT DETECTED NOT DETECTED Final   Serratia marcescens NOT DETECTED NOT DETECTED Final   Haemophilus influenzae NOT DETECTED NOT DETECTED Final   Neisseria meningitidis NOT DETECTED NOT DETECTED Final   Pseudomonas aeruginosa NOT DETECTED NOT DETECTED Final   Stenotrophomonas maltophilia NOT DETECTED NOT DETECTED Final   Candida albicans NOT DETECTED NOT DETECTED Final   Candida auris NOT DETECTED NOT DETECTED Final   Candida glabrata NOT DETECTED NOT DETECTED Final   Candida krusei NOT DETECTED NOT DETECTED Final   Candida parapsilosis NOT DETECTED NOT DETECTED Final   Candida tropicalis NOT DETECTED NOT DETECTED Final    Cryptococcus neoformans/gattii NOT DETECTED NOT DETECTED Final   Meth resistant mecA/C and MREJ NOT DETECTED NOT DETECTED Final    Comment: Performed at [facility], [address] [place], [place] [address]  Aerobic/Anaerobic Culture w Gram Stain (surgical/deep wound)     Status: None   Collection Time: 05/06/21  7:47 PM   Specimen: Toe; Wound  Result Value Ref Range Status   Specimen Description   Final    TOE Performed at [facility], [address] [place], [place] [address]    Special Requests   Final    Immunocompromised Performed at [facility], [address] [place], [place] [address]    Gram Stain   Final    NO WBC SEEN MODERATE GRAM POSITIVE COCCI IN PAIRS    Culture   Final    ABUNDANT STAPHYLOCOCCUS AUREUS MODERATE ENTEROCOCCUS FAECALIS ABUNDANT BACTEROIDES FRAGILIS BETA LACTAMASE POSITIVE Performed at [facility], [place] [address], [place], [place] [address]    Report Status 05/10/2021 FINAL  Final   Organism ID, Bacteria STAPHYLOCOCCUS AUREUS  Final   Organism ID, Bacteria ENTEROCOCCUS FAECALIS  Final      Susceptibility   Enterococcus faecalis - MIC*    AMPICILLIN <=2 SENSITIVE Sensitive     VANCOMYCIN  2 SENSITIVE Sensitive     GENTAMICIN SYNERGY SENSITIVE Sensitive     * MODERATE ENTEROCOCCUS FAECALIS   Staphylococcus aureus - MIC*    CIPROFLOXACIN >=8 RESISTANT Resistant     ERYTHROMYCIN <=0.25 SENSITIVE Sensitive     GENTAMICIN <=0.5 SENSITIVE Sensitive     OXACILLIN <=0.25 SENSITIVE Sensitive     TETRACYCLINE <=1 SENSITIVE Sensitive     VANCOMYCIN <=0.5 SENSITIVE Sensitive     TRIMETH/SULFA <=10 SENSITIVE Sensitive     CLINDAMYCIN <=0.25 SENSITIVE Sensitive     RIFAMPIN <=0.5 SENSITIVE Sensitive     Inducible Clindamycin NEGATIVE Sensitive     * ABUNDANT STAPHYLOCOCCUS AUREUS  MRSA Next Gen by PCR, Nasal     Status: None   Collection Time: 05/08/21  2:17 AM   Specimen: Nasal Mucosa; Nasal Swab  Result Value  Ref Range Status   MRSA by PCR Next Gen NOT DETECTED NOT DETECTED Final    Comment: (NOTE) The GeneXpert MRSA Assay (FDA approved for NASAL specimens only), is one component of a comprehensive MRSA colonization surveillance program. It is not intended to diagnose MRSA infection nor to guide or monitor treatment for MRSA infections. Test performance is not FDA approved in patients less than 64 years old. Performed at [facility], [place] [address], [place], [place] [address]   Culture, blood (routine x 2)     Status: None (Preliminary result)   Collection Time: 05/08/21  6:11 AM   Specimen: BLOOD RIGHT HAND  Result Value Ref Range Status   Specimen Description BLOOD RIGHT HAND  Final   Special Requests   Final    BOTTLES DRAWN AEROBIC AND ANAEROBIC Blood Culture results may not be optimal due to an inadequate volume of blood received in culture bottles   Culture   Final    NO GROWTH 3 DAYS Performed at [facility], [place] [address], [place], [place] [address]    Report Status PENDING  Incomplete  Culture, blood (routine x 2)     Status: None (Preliminary result)   Collection Time: 05/08/21  6:26 AM   Specimen: BLOOD  Result Value Ref Range Status   Specimen Description BLOOD LEFT ANTECUBITAL  Final   Special Requests AEROBIC BOTTLE ONLY Blood Culture adequate volume  Final   Culture   Final    NO GROWTH 3 DAYS Performed at [facility], [place] [address], [place], [place] [address]    Report Status PENDING  Incomplete  Aerobic/Anaerobic Culture w Gram Stain (surgical/deep wound)     Status: None (Preliminary result)   Collection Time: 05/08/21  8:10 AM   Specimen: PATH Other; Tissue  Result Value Ref Range Status   Specimen Description TISSUE  Final   Special Requests TIS FROM GREAT LEFT TOE SPEC A  Final   Gram Stain   Final    FEW WBC PRESENT, PREDOMINANTLY PMN FEW GRAM POSITIVE COCCI Performed at [facility], [place] [address] [place], [place] [address]     Culture   Final    MODERATE STAPHYLOCOCCUS AUREUS NO ANAEROBES ISOLATED; CULTURE IN PROGRESS FOR 5 DAYS    Report Status PENDING  Incomplete   Organism ID, Bacteria STAPHYLOCOCCUS AUREUS  Final      Susceptibility   Staphylococcus aureus - MIC*    CIPROFLOXACIN >=8 RESISTANT Resistant     ERYTHROMYCIN <=0.25 SENSITIVE Sensitive     GENTAMICIN <=0.5 SENSITIVE Sensitive     OXACILLIN <=0.25 SENSITIVE Sensitive  TETRACYCLINE <=1 SENSITIVE Sensitive     VANCOMYCIN 1 SENSITIVE Sensitive     TRIMETH/SULFA <=10 SENSITIVE Sensitive     CLINDAMYCIN <=0.25 SENSITIVE Sensitive     RIFAMPIN <=0.5 SENSITIVE Sensitive     Inducible Clindamycin NEGATIVE Sensitive     * MODERATE STAPHYLOCOCCUS AUREUS      Radiology Studies: IR Removal Tun Cv Cath W/O FL  Result Date: 05/11/2021 INDICATION: Patient with a history of end-stage renal disease with right IJ tunneled dialysis catheter placed in IR March 26, 2021. Patient now admitted with bacteremia. Interventional radiology asked to remove tunneled dialysis catheter for line holiday. Patient with plans for new tunneled dialysis catheter placement 05/13/2021. EXAM: REMOVAL TUNNELED CENTRAL VENOUS CATHETER MEDICATIONS: 1% lidocaine 5 mL ANESTHESIA/SEDATION: None FLUOROSCOPY TIME:  None COMPLICATIONS: None immediate. PROCEDURE: Informed written consent was obtained from the patient after a thorough discussion of the procedural risks, benefits and alternatives. All questions were addressed. Maximal Sterile Barrier Technique was utilized including caps, mask, sterile gowns, sterile gloves, sterile drape, hand hygiene and skin antiseptic. A timeout was performed prior to the initiation of the procedure. The patient's right chest and catheter was prepped and draped in a normal sterile fashion. Heparin was removed from both ports of catheter. 1% lidocaine was used for local anesthesia. Using gentle blunt dissection and moderate manual traction the cuff of the  catheter was exposed and the catheter was removed in it's entirety. Pressure was held till hemostasis was obtained. A sterile dressing was applied. The patient tolerated the procedure well with no immediate complications. IMPRESSION: Successful catheter removal as described above. Read by: Soyla Dryer, NP Electronically Signed   By: Sandi Mariscal M.D.   On: 05/11/2021 16:40     LOS: 5 days   Bonnielee Haff, MD [facility]  05/12/2021, 11:46 AM

## 2021-05-12 NOTE — Interval H&P Note (Signed)
History and Physical Interval Note:  05/12/2021 8:23 AM  Paul [name]  has presented today for surgery, with the diagnosis of BACTEREMIA.  The various methods of treatment have been discussed with the patient and family. After consideration of risks, benefits and other options for treatment, the patient has consented to  Procedure(s): TRANSESOPHAGEAL ECHOCARDIOGRAM (TEE) (N/A) as a surgical intervention.  The patient's history has been reviewed, patient examined, no change in status, stable for surgery.  I have reviewed the patient's chart and labs.  Questions were answered to the patient's satisfaction.     Alaa Mullally A Neale Marzette

## 2021-05-12 NOTE — Progress Notes (Signed)
Pt continuing to refuse cardiac monitoring.

## 2021-05-12 NOTE — Progress Notes (Signed)
ANTICOAGULATION CONSULT NOTE - Initial Consult  Pharmacy Consult for heparin Indication: SVC thrombus  Allergies  Allergen Reactions   Hydralazine Hcl Other (See Comments)    DRUG-INDUCED LUPUS    Patient Measurements: Height: 6\' 1"  (185.4 cm) Weight: (!) 138.2 kg (304 lb 10.8 oz) IBW/kg (Calculated) : 79.9 Heparin Dosing Weight: 111 kg  Vital Signs: Temp: 97.9 F (36.6 C) (12/21 1002) Temp Source: Oral (12/21 1002) BP: 93/81 (12/21 1002) Pulse Rate: 92 (12/21 1002)  Labs: Recent Labs    05/10/21 0331 05/11/21 0824 05/12/21 0346  HGB 12.0* 11.3* 13.8  HCT 37.0* 35.0* 40.1  PLT 299 362 347  CREATININE  --  16.25* 12.87*    Estimated Creatinine Clearance: 12.5 mL/min (A) (by C-G formula based on SCr of 12.87 mg/dL (H)).   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 MD wishes to start heparin. No PTA AC noted on med list. Last dose sq heparin ~0600 this AM.    Goal of Therapy:  Heparin level 0.3-0.7 units/ml Monitor platelets by anticoagulation protocol: Yes   Plan: D/c sq heparin  Heparin 5000 units IV x 1 Then Heparin drip at 1800 units/hr Heparin level in 8 hours and daily Monitor for bleeding  Natlie Asfour A. Levada Dy, PharmD, BCPS, FNKF Clinical Pharmacist [place] Please utilize Amion for appropriate phone number to reach the unit pharmacist ([place])  05/12/2021,12:15 PM

## 2021-05-13 ENCOUNTER — Inpatient Hospital Stay (HOSPITAL_COMMUNITY): Payer: Medicaid Other

## 2021-05-13 ENCOUNTER — Other Ambulatory Visit (HOSPITAL_COMMUNITY): Payer: Self-pay

## 2021-05-13 ENCOUNTER — Encounter (HOSPITAL_COMMUNITY): Payer: Self-pay | Admitting: Internal Medicine

## 2021-05-13 DIAGNOSIS — Z794 Long term (current) use of insulin: Secondary | ICD-10-CM | POA: Diagnosis not present

## 2021-05-13 DIAGNOSIS — I82210 Acute embolism and thrombosis of superior vena cava: Secondary | ICD-10-CM | POA: Diagnosis not present

## 2021-05-13 DIAGNOSIS — N250 Renal osteodystrophy: Secondary | ICD-10-CM | POA: Diagnosis not present

## 2021-05-13 DIAGNOSIS — I120 Hypertensive chronic kidney disease with stage 5 chronic kidney disease or end stage renal disease: Secondary | ICD-10-CM | POA: Diagnosis not present

## 2021-05-13 DIAGNOSIS — N186 End stage renal disease: Secondary | ICD-10-CM | POA: Diagnosis not present

## 2021-05-13 DIAGNOSIS — B9561 Methicillin susceptible Staphylococcus aureus infection as the cause of diseases classified elsewhere: Secondary | ICD-10-CM | POA: Diagnosis not present

## 2021-05-13 DIAGNOSIS — Z992 Dependence on renal dialysis: Secondary | ICD-10-CM | POA: Diagnosis not present

## 2021-05-13 DIAGNOSIS — A419 Sepsis, unspecified organism: Secondary | ICD-10-CM | POA: Diagnosis not present

## 2021-05-13 DIAGNOSIS — E1129 Type 2 diabetes mellitus with other diabetic kidney complication: Secondary | ICD-10-CM | POA: Diagnosis not present

## 2021-05-13 DIAGNOSIS — R7881 Bacteremia: Secondary | ICD-10-CM | POA: Diagnosis not present

## 2021-05-13 HISTORY — PX: IR FLUORO GUIDE CV LINE RIGHT: IMG2283

## 2021-05-13 HISTORY — PX: IR US GUIDE VASC ACCESS RIGHT: IMG2390

## 2021-05-13 LAB — PROTEIN S, TOTAL: Protein S Ag, Total: 162 % — ABNORMAL HIGH (ref 60–150)

## 2021-05-13 LAB — LUPUS ANTICOAGULANT PANEL
DRVVT: 50 s — ABNORMAL HIGH (ref 0.0–47.0)
PTT Lupus Anticoagulant: 42.9 s (ref 0.0–51.9)

## 2021-05-13 LAB — CULTURE, BLOOD (ROUTINE X 2)
Culture: NO GROWTH
Culture: NO GROWTH
Special Requests: ADEQUATE

## 2021-05-13 LAB — GLUCOSE, CAPILLARY
Glucose-Capillary: 175 mg/dL — ABNORMAL HIGH (ref 70–99)
Glucose-Capillary: 137 mg/dL — ABNORMAL HIGH (ref 70–99)
Glucose-Capillary: 134 mg/dL — ABNORMAL HIGH (ref 70–99)
Glucose-Capillary: 153 mg/dL — ABNORMAL HIGH (ref 70–99)

## 2021-05-13 LAB — DRVVT CONFIRM: dRVVT Confirm: 1.1 ratio (ref 0.8–1.2)

## 2021-05-13 LAB — HEPARIN LEVEL (UNFRACTIONATED): Heparin Unfractionated: 0.25 IU/mL — ABNORMAL LOW (ref 0.30–0.70)

## 2021-05-13 LAB — PROTEIN C ACTIVITY: Protein C Activity: 148 % (ref 73–180)

## 2021-05-13 LAB — HOMOCYSTEINE: Homocysteine: 12 umol/L (ref 0.0–14.5)

## 2021-05-13 LAB — AEROBIC/ANAEROBIC CULTURE W GRAM STAIN (SURGICAL/DEEP WOUND)

## 2021-05-13 LAB — PROTEIN S ACTIVITY: Protein S Activity: 113 % (ref 63–140)

## 2021-05-13 LAB — DRVVT MIX: dRVVT Mix: 45.2 s — ABNORMAL HIGH (ref 0.0–40.4)

## 2021-05-13 LAB — PROTEIN C, TOTAL: Protein C, Total: 128 % (ref 60–150)

## 2021-05-13 LAB — BETA-2-GLYCOPROTEIN I ABS, IGG/M/A
Beta-2 Glyco I IgG: <9 GPI IgG units (ref 0–20)
Beta-2-Glycoprotein I IgA: <9 GPI IgA units (ref 0–25)
Beta-2-Glycoprotein I IgM: <9 GPI IgM units (ref 0–32)

## 2021-05-13 MED ORDER — HEPARIN (PORCINE) 25000 UT/250ML-% IV SOLN
2200.0000 [IU]/h | INTRAVENOUS | Status: AC
  Administered 2021-05-13: 2200 [IU]/h via INTRAVENOUS
  Filled 2021-05-13 (×2): qty 250

## 2021-05-13 MED ORDER — FENTANYL CITRATE (PF) 100 MCG/2ML IJ SOLN
INTRAMUSCULAR | Status: AC | PRN
  Administered 2021-05-13: 25 ug via INTRAVENOUS
  Administered 2021-05-13: 50 ug via INTRAVENOUS
  Administered 2021-05-13: 25 ug via INTRAVENOUS

## 2021-05-13 MED ORDER — MIDAZOLAM HCL 2 MG/2ML IJ SOLN
INTRAMUSCULAR | Status: AC | PRN
  Administered 2021-05-13 (×4): .5 mg via INTRAVENOUS

## 2021-05-13 MED ORDER — MIDAZOLAM HCL 2 MG/2ML IJ SOLN
INTRAMUSCULAR | Status: AC
  Filled 2021-05-13: qty 2

## 2021-05-13 MED ORDER — LIDOCAINE HCL 1 % IJ SOLN
INTRAMUSCULAR | Status: AC
  Administered 2021-05-13: 13:00:00 5 mL
  Filled 2021-05-13: qty 20

## 2021-05-13 MED ORDER — HEPARIN SODIUM (PORCINE) 1000 UNIT/ML IJ SOLN
INTRAMUSCULAR | Status: AC
  Administered 2021-05-13: 13:00:00 3.8 mL
  Filled 2021-05-13: qty 10

## 2021-05-13 MED ORDER — IOHEXOL 300 MG/ML  SOLN
100.0000 mL | Freq: Once | INTRAMUSCULAR | Status: AC | PRN
  Administered 2021-05-13: 13:00:00 15 mL via INTRAVENOUS

## 2021-05-13 MED ORDER — FENTANYL CITRATE (PF) 100 MCG/2ML IJ SOLN
INTRAMUSCULAR | Status: AC
  Filled 2021-05-13: qty 2

## 2021-05-13 MED ORDER — HEPARIN SODIUM (PORCINE) 1000 UNIT/ML IJ SOLN
INTRAMUSCULAR | Status: AC
  Filled 2021-05-13: qty 4

## 2021-05-13 NOTE — Progress Notes (Addendum)
[place] for heparin Indication: SVC thrombus  Allergies  Allergen Reactions   Hydralazine Hcl Other (See Comments)    DRUG-INDUCED LUPUS    Patient Measurements: Height: 6\' 1"  (185.4 cm) Weight: (!) 138.2 kg (304 lb 10.8 oz) IBW/kg (Calculated) : 79.9 Heparin Dosing Weight: 111 kg  Vital Signs: Temp: 97.4 F (36.3 C) (12/22 0737) Temp Source: Oral (12/22 0737) BP: 141/96 (12/22 0737) Pulse Rate: 80 (12/22 0737)  Labs: Recent Labs    05/11/21 0824 05/12/21 0346 05/12/21 2239 05/13/21 0736  HGB 11.3* 13.8  --   --   HCT 35.0* 40.1  --   --   PLT 362 347  --   --   HEPARINUNFRC  --   --  0.24* 0.25*  CREATININE 16.25* 12.87*  --   --      Estimated Creatinine Clearance: 12.5 mL/min (A) (by C-G formula based on SCr of 12.87 mg/dL (H)).   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 MD wishes to start heparin. No PTA AC noted on med list.  Heparin level subtherapeutic (0.25) on gtt at 2050 units/hr. No  bleeding reported per RN. Heparin not running briefly around time of lab draw. Will increase,  but not as much.   Goal of Therapy:  Heparin level 0.3-0.7 units/ml Monitor platelets by anticoagulation protocol: Yes   Plan: Increase heparin infusion to 2200 units/hr Will f/u 8 hr heparin level  Llewelyn Sheaffer A. Levada Dy, PharmD, BCPS, FNKF Clinical Pharmacist [place] Please utilize Amion for appropriate phone number to  reach the unit pharmacist ([place])  Addnedum: Heparin off at ~1200 for [facility] placement, Post-IR protocol consult placed with standard bleed risk. Heparin will remain off for 6 hours post procedure and be resumed at 1930 at rate of 2200 units/hr. Heparin level 8 hours after restart.   05/13/2021,8:48 AM

## 2021-05-13 NOTE — Plan of Care (Signed)
  Problem: Health Behavior/Discharge Planning: Goal: Ability to manage health-related needs will improve Outcome: Progressing   Problem: Activity: Goal: Risk for activity intolerance will decrease Outcome: Progressing   Problem: Pain Managment: Goal: General experience of comfort will improve Outcome: Progressing   

## 2021-05-13 NOTE — Progress Notes (Signed)
Patient ID: Paul [name], male   DOB: 02-01-1991, 30 y.o.   MRN: [id number] S: No events overnight O:BP (!) 141/96 (BP Location: Left Leg)    Pulse 80    Temp (!) 97.4 F (36.3 C) (Oral)    Resp 17    Ht 6\' 1"  (1.854 m)    Wt (!) 138.2 kg    SpO2 99%    BMI 40.20 kg/m   Intake/Output Summary (Last 24 hours) at 05/13/2021 1052 Last data filed at 05/13/2021 0900 Gross per 24 hour  Intake 120 ml  Output --  Net 120 ml   Intake/Output: I/O last 3 completed shifts: In: 110 [I.V.:110] Out: -   Intake/Output this shift:  Total I/O In: 120 [P.O.:120] Out: -  Weight change: -12.8 kg Gen: NAD CVS: RRR Resp:CTA Abd: +BS, soft, NT/Nd Ext: no edema  Recent Labs  Lab 05/06/21 1600 05/07/21 0312 05/08/21 0621 05/11/21 0824 05/12/21 0346  NA 134* 132* 134* 133* 136  K 3.8 3.2* 3.7 3.9 3.7  CL 92* 94* 93* 97* 97*  CO2 25 23 22 22 24   GLUCOSE 163* 158* 109* 199* 111*  BUN 30* 32* 47* 63* 46*  CREATININE 11.66* 13.15* 16.36* 16.25* 12.87*  ALBUMIN 4.3  --   --  2.9*  --   CALCIUM 9.7 9.2 8.8* 8.9 9.7  PHOS  --   --   --  4.0  --   AST 21  --   --   --   --   ALT 12  --   --   --   --    Liver Function Tests: Recent Labs  Lab 05/06/21 1600 05/11/21 0824  AST 21  --   ALT 12  --   ALKPHOS 69  --   BILITOT 0.7  --   PROT 9.8*  --   ALBUMIN 4.3 2.9*   No results for input(s): LIPASE, AMYLASE in the last 168 hours. No results for input(s): AMMONIA in the last 168 hours. CBC: Recent Labs  Lab 05/06/21 1600 05/07/21 0312 05/08/21 0621 05/09/21 0232 05/10/21 0331 05/11/21 0824 05/12/21 0346  WBC 19.5*   < > 19.7* 12.6* 9.7 10.0 11.1*  NEUTROABS 15.0*  --   --   --   --   --   --   HGB 14.3   < > 11.9* 11.4* 12.0* 11.3* 13.8  HCT 43.0   < > 35.9* 35.9* 37.0* 35.0* 40.1  MCV 84.0   < > 83.7 84.3 84.3 83.7 83.7  PLT 226   < > 237 230 299 362 347   < > = values in this interval not displayed.   Cardiac Enzymes: No results for input(s): CKTOTAL, CKMB, CKMBINDEX,  TROPONINI in the last 168 hours. CBG: Recent Labs  Lab 05/12/21 0925 05/12/21 1146 05/12/21 1609 05/12/21 1928 05/13/21 0623  GLUCAP 134* 260* 111* 199* 175*    Iron Studies: No results for input(s): IRON, TIBC, TRANSFERRIN, FERRITIN in the last 72 hours. Studies/Results: IR Removal Tun Cv Cath W/O FL  Result Date: 05/11/2021 INDICATION: Patient with a history of end-stage renal disease with right IJ tunneled dialysis catheter placed in IR March 26, 2021. Patient now admitted with bacteremia. Interventional radiology asked to remove tunneled dialysis catheter for line holiday. Patient with plans for new tunneled dialysis catheter placement 05/13/2021. EXAM: REMOVAL TUNNELED CENTRAL VENOUS CATHETER MEDICATIONS: 1% lidocaine 5 mL ANESTHESIA/SEDATION: None FLUOROSCOPY TIME:  None COMPLICATIONS: None immediate. PROCEDURE: Informed written consent was obtained from  the patient after a thorough discussion of the procedural risks, benefits and alternatives. All questions were addressed. Maximal Sterile Barrier Technique was utilized including caps, mask, sterile gowns, sterile gloves, sterile drape, hand hygiene and skin antiseptic. A timeout was performed prior to the initiation of the procedure. The patient's right chest and catheter was prepped and draped in a normal sterile fashion. Heparin was removed from both ports of catheter. 1% lidocaine was used for local anesthesia. Using gentle blunt dissection and moderate manual traction the cuff of the catheter was exposed and the catheter was removed in it's entirety. Pressure was held till hemostasis was obtained. A sterile dressing was applied. The patient tolerated the procedure well with no immediate complications. IMPRESSION: Successful catheter removal as described above. Read by: Soyla Dryer, NP Electronically Signed   By: Sandi Mariscal M.D.   On: 05/11/2021 16:40   ECHO TEE  Result Date: 05/12/2021    TRANSESOPHOGEAL ECHO REPORT   Patient  Name:   Paul [name] Date of Exam: 05/12/2021 Medical Rec #:  [id number]     Height:       73.0 in Accession #:    [id number]    Weight:       304.7 lb Date of Birth:  19-Jun-1990      BSA:          2.573 m Patient Age:    30 years      BP:           118/89 mmHg Patient Gender: M             HR:           98 bpm. Exam Location:  Inpatient Procedure: Transesophageal Echo, 3D Echo, Color Doppler and Cardiac Doppler Indications:     Bacteremia  History:         Patient has prior history of Echocardiogram examinations, most                  recent 05/09/2021. Risk Factors:Hypertension, Dyslipidemia and                  Diabetes.  Sonographer:     Bernadene Person RDCS Referring Phys:  1991 RHONDA G BARRETT Diagnosing Phys: Rudean Haskell MD PROCEDURE: After discussion of the risks and benefits of a TEE, an informed consent was obtained from the patient. The transesophogeal probe was passed without difficulty through the esophogus of the patient. Local oropharyngeal anesthetic was provided with Cetacaine. Sedation performed by different physician. The patient was monitored while under deep sedation. Anesthestetic sedation was provided intravenously by Anesthesiology: 617.52mg  of Propofol, 60mg  of Lidocaine. The patient developed no complications during the procedure. IMPRESSIONS  1. There is a large, echodensity 22 mm X 25mm in the SVC, with a long filamentous stalk. The stalk is adjacent to the tricuspid valve at the tip but appears to have a connection to the SVC lesion.  2. Left ventricular ejection fraction, by estimation, is 65 to 70%. The left ventricle has normal function.  3. Right ventricular systolic function is normal. The right ventricular size is normal.  4. No left atrial/left atrial appendage thrombus was detected.  5. The mitral valve is normal in structure. No evidence of mitral valve regurgitation.  6. The aortic valve is tricuspid. Aortic valve regurgitation is not visualized. No aortic stenosis is  present. Conclusion(s)/Recommendation(s): In the setting of bacteremia and recent central line, cannot exclude septic thrombus arising from the SVC. Repeat imaging after therapy (TEE  vs Cardiac CT) is reasonable. Reached out to primary and ID team post procedure. FINDINGS  Left Ventricle: Left ventricular ejection fraction, by estimation, is 65 to 70%. The left ventricle has normal function. The left ventricular internal cavity size was normal in size. Right Ventricle: The right ventricular size is normal. No increase in right ventricular wall thickness. Right ventricular systolic function is normal. Left Atrium: Left atrial size was normal in size. No left atrial/left atrial appendage thrombus was detected. Right Atrium: Right atrial size was not well visualized. Pericardium: There is no evidence of pericardial effusion. Mitral Valve: The mitral valve is normal in structure. No evidence of mitral valve regurgitation. Tricuspid Valve: The tricuspid valve is abnormal. Tricuspid valve regurgitation is not demonstrated. Aortic Valve: The aortic valve is tricuspid. Aortic valve regurgitation is not visualized. No aortic stenosis is present. Pulmonic Valve: The pulmonic valve was normal in structure. Pulmonic valve regurgitation is not visualized. Aorta: The aortic root and ascending aorta are structurally normal, with no evidence of dilitation. IAS/Shunts: The atrial septum is grossly normal.   3D Volume EF LV 3D EF:    69.60 % LV 3D EDV:   234100.00 mm LV 3D ESV:   71200.00 mm LV 3D SV:    163000.00 mm Rudean Haskell MD Electronically signed by Rudean Haskell MD Signature Date/Time: 05/12/2021/6:44:44 PM    Final     amoxicillin-clavulanate  1 tablet Oral QHS   Chlorhexidine Gluconate Cloth  6 each Topical Q0600   cinacalcet  60 mg Oral QODAY   ferric citrate  630 mg Oral TID WC   gabapentin  100 mg Oral TID   insulin aspart  0-6 Units Subcutaneous TID WC & HS   insulin glargine-yfgn  5 Units  Subcutaneous Daily   levothyroxine  75 mcg Oral QAC breakfast   melatonin  3 mg Oral QHS   midodrine  5 mg Oral TID WC   multivitamin  1 tablet Oral QPM   rosuvastatin  20 mg Oral Daily   sodium chloride flush  3 mL Intravenous Q12H    BMET    Component Value Date/Time   NA 136 05/12/2021 0346   NA 139 06/14/2019 0914   K 3.7 05/12/2021 0346   CL 97 (L) 05/12/2021 0346   CO2 24 05/12/2021 0346   GLUCOSE 111 (H) 05/12/2021 0346   BUN 46 (H) 05/12/2021 0346   BUN 31 (H) 06/14/2019 0914   CREATININE 12.87 (H) 05/12/2021 0346   CREATININE 1.85 (H) 11/28/2014 1153   CALCIUM 9.7 05/12/2021 0346   GFRNONAA 5 (L) 05/12/2021 0346   GFRNONAA 51 (L) 11/28/2014 1153   GFRAA 6 (L) 06/14/2019 0914   GFRAA 58 (L) 11/28/2014 1153   CBC    Component Value Date/Time   WBC 11.1 (H) 05/12/2021 0346   RBC 4.79 05/12/2021 0346   HGB 13.8 05/12/2021 0346   HCT 40.1 05/12/2021 0346   PLT 347 05/12/2021 0346   MCV 83.7 05/12/2021 0346   MCH 28.8 05/12/2021 0346   MCHC 34.4 05/12/2021 0346   RDW 17.6 (H) 05/12/2021 0346   LYMPHSABS 2.4 05/06/2021 1600   MONOABS 1.7 (H) 05/06/2021 1600   EOSABS 0.1 05/06/2021 1600   BASOSABS 0.1 05/06/2021 1600     [facility] HD: [facility]   4.5h   500/1.5   145kg  2/2 bath P2 TDC  Hep 8000  - hect 5ug tiw  - no esa   Assessment/Plan: Sepsis/ L foot osteo - w/ ^WBC, hypotension. 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  WBC improving down to 9.7.   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 [name], per ID will need a line holiday.   [place] removed 05/11/21 and to have new HD catheter placed 05/13/21 by IR. 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  ESRD - on HD TTS. Continue on schedule.  Plans to go to sister's house in [place] after discharge  --will need transient placement at [facility]. Contact renal navigator for assistance on Monday.  HD access - on HD 3 yrs. 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 Supposed to see [facility] soon about next access. Using [facility] now.  HTN - BP's soft likely due to infection. Better today.  DM2 - on insulin.  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 Cont sensipar and [place].  Anemia ckd - Hb >10, no esa at OP unit. Follow.   Donetta Potts, MD [employer] [phone]

## 2021-05-13 NOTE — Procedures (Signed)
Interventional Radiology Procedure Note  Procedure: Right IJ 23 cm Palindrome tunneled HD catheter placed.  Tips in the RA and ready for use.  Complications: None  Estimated Blood Loss: <25 mL  Recommendations: - [place] access, right IJ is occluded and future removal of this line may force transition to LEFT IJ.  - Routine line care.    Signed,  Criselda Peaches, MD

## 2021-05-13 NOTE — Progress Notes (Addendum)
Contacted [place] and spoke to [place], Agricultural consultant. Inquired if pt could have a 1st shift appt to allow pt's sister to provide transport to/from HD more easily. Clinic able to have pt arrive at 6:00 starting 12/27. Pt can start on Saturday if needed but time will be determined by clinic once it is known if pt will start that day. Caryl Pina aware of iv abx needs and states clinic is able to provide. Attempted to reach pt's sister via phone to discuss the above. Message left requesting a return call. Will assist as needed.  Melven Sartorius Renal Navigator [phone]  Addendum at 4:36 pm: Spoke to pt's sister via phone and provided above update. Sister agreeable to plan.

## 2021-05-13 NOTE — Progress Notes (Signed)
PROGRESS NOTE    Paul [name]  EXB:[id number] DOB: 1991/03/12 DOA: 05/06/2021 PCP: Orvis Brill, DO    Brief Narrative/Hospital Course: Paul [name], 30 y.o. male with PMH of ESRD and HD TTS, T2DM, morbid obesity bmi 43, HTN, HLD, anemia of chronic disease, hypothyroidism presented with wound on the left great toe along with redness, pain initially started as a blister and ulcer on the bottom of the left great toe few weeks ago and got worse in last 2 to 3 days. He was seen in the ED systolic blood pressure insulin 80s given IV fluid boluses CT scan of the fluid no periosteal reaction MRI recommended, also had leukocytosis lactic acidosis abnormal LFTs and concern about severe sepsis PCCM was consulted and after discussion transferred under hospitalist for admission for severe sepsis. 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  Pain in the left foot is well controlled.  He is somewhat anxious about his dialysis catheter that needs to be replaced.    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 Culture polymicrobial. Blood culture MSSA. Repeat blood culture negative ID recommended line holiday.  Dialysis catheter was removed on 12/20. 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 Infectious diseases recommended 6 weeks of cefazolin, through 06/19/2021.  This can be given with dialysis. 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  Likely related to his dialysis catheter. Discussed with patient.  Started on IV heparin.  Transition to oral  anticoagulants once dialysis access has been placed.    T2DM on insulin with CKD and diabetic neuropathy Stable for the most part.  HbA1c 6.0.  Continue Lantus.  SSI.   He was started on Neurontin for neuropathy.    Hypotension Noted to be on midodrine.  Blood pressure is stable.  Hyperlipidemia Continue statin.    ESRD and HD TTS/metabolic bone disease Nephrology is following.  Dialysis catheter removed 12/20 for line holiday.  SVC thrombus could create issues with the new access.  Plan is for new access today.  Anemia of chronic disease Hemoglobin has been stable.  We will recheck labs tomorrow.  Hypothyroidism Continue levothyroxine.  Class III obesity Estimated body mass index is 40.2 kg/m as calculated from the following:   Height as of this encounter: 6\' 1"  (1.854 m).   Weight as of this encounter: 138.2 kg.   DVT prophylaxis:  Code Status:   Code Status: Full Code Family Communication: Discussed with patient Disposition: Hopefully return home when improved  Status is: Inpatient Remains inpatient appropriate because: For IV antibiotics and management of osteomyelitis  Objective: Vitals last 24 hrs: Vitals:   05/12/21 1932 05/12/21 2254 05/13/21 0336 05/13/21 0737  BP: 102/67 103/62 110/84 (!) 141/96  Pulse: 92 87 80 80  Resp: 18 19 18 17   Temp: 98 F (36.7 C) 97.8 F (36.6 C) (!) 97.4 F (36.3 C) (!) 97.4 F (36.3 C)  TempSrc: Oral Oral Oral Oral  SpO2: 100% 98% 98% 99%  Weight:      Height:       Weight change: -12.8 kg  Intake/Output Summary (  Last 24 hours) at 05/13/2021 1121 Last data filed at 05/13/2021 0900 Gross per 24 hour  Intake 120 ml  Output --  Net 120 ml     Net IO Since Admission: -1,507.06 mL [05/13/21 1121]    Physical Examination:  General appearance: Awake alert.  In no distress Resp: Clear to auscultation bilaterally.  Normal effort Cardio: S1-S2 is normal regular.  No S3-S4.  No rubs murmurs or bruit GI: Abdomen is  soft.  Nontender nondistended.  Bowel sounds are present normal.  No masses organomegaly Extremities: No edema.  Full range of motion of lower extremities. Neurologic: Alert and oriented x3.  No focal neurological deficits.      Medications reviewed:  Scheduled Meds:  amoxicillin-clavulanate  1 tablet Oral QHS   Chlorhexidine Gluconate Cloth  6 each Topical Q0600   cinacalcet  60 mg Oral QODAY   ferric citrate  630 mg Oral TID WC   gabapentin  100 mg Oral TID   insulin aspart  0-6 Units Subcutaneous TID WC & HS   insulin glargine-yfgn  5 Units Subcutaneous Daily   levothyroxine  75 mcg Oral QAC breakfast   melatonin  3 mg Oral QHS   midodrine  5 mg Oral TID WC   multivitamin  1 tablet Oral QPM   rosuvastatin  20 mg Oral Daily   sodium chloride flush  3 mL Intravenous Q12H  Continuous Infusions:  sodium chloride      ceFAZolin (ANCEF) IV 2 g (05/12/21 0032)   heparin 2,200 Units/hr (05/13/21 0932)   Diet Order             Diet renal/carb modified with fluid restriction Diet-HS Snack? Nothing; Fluid restriction: 1200 mL Fluid; Room service appropriate? Yes; Fluid consistency: Thin  Diet effective now                 Weight change: -12.8 kg  Wt Readings from Last 3 Encounters:  05/12/21 (!) 138.2 kg  03/26/21 (!) 149.7 kg  03/17/21 (!) 151 kg   Consultants:see note   Procedures:see note  Antimicrobials: Anti-infectives (From admission, onward)    Start     Dose/Rate Route Frequency Ordered Stop   05/13/21 1000  ceFAZolin (ANCEF) IVPB 2g/100 mL premix  Status:  Discontinued        2 g 200 mL/hr over 30 Minutes Intravenous To Radiology 05/11/21 1120 05/11/21 1210   05/13/21 1000  ceFAZolin (ANCEF) IVPB 1 g/50 mL premix  Status:  Discontinued        1 g 100 mL/hr over 30 Minutes Intravenous To Radiology 05/11/21 1211 05/12/21 0954   05/10/21 2200  amoxicillin-clavulanate (AUGMENTIN) 500-125 MG per tablet 500 mg        1 tablet Oral Daily at bedtime 05/10/21 1529  05/15/21 2159   05/08/21 1800  ceFAZolin (ANCEF) IVPB 2g/100 mL premix        2 g 200 mL/hr over 30 Minutes Intravenous Every T-Th-Sa (1800) 05/07/21 1239     05/08/21 1600  amoxicillin (AMOXIL) capsule 500 mg  Status:  Discontinued        500 mg Oral Every 24 hours 05/08/21 1535 05/10/21 1522   05/08/21 1200  vancomycin (VANCOCIN) IVPB 1000 mg/200 mL premix  Status:  Discontinued        1,000 mg 200 mL/hr over 60 Minutes Intravenous Every T-Th-Sa (Hemodialysis) 05/07/21 0233 05/07/21 1239   05/08/21 0600  ceFAZolin (ANCEF) IVPB 3g/100 mL premix        3  g 200 mL/hr over 30 Minutes Intravenous On call to O.R. 05/07/21 1857 05/08/21 0755   05/07/21 1530  ceFAZolin (ANCEF) IVPB 2g/100 mL premix  Status:  Discontinued        2 g 200 mL/hr over 30 Minutes Intravenous To Radiology 05/07/21 1436 05/07/21 1615   05/06/21 1700  vancomycin (VANCOCIN) IVPB 1000 mg/200 mL premix        1,000 mg 200 mL/hr over 60 Minutes Intravenous Every 1 hr x 2 05/06/21 1621 05/06/21 2005   05/06/21 1630  ceFEPIme (MAXIPIME) 2 g in sodium chloride 0.9 % 100 mL IVPB        2 g 200 mL/hr over 30 Minutes Intravenous  Once 05/06/21 1621 05/06/21 1701   05/06/21 1630  metroNIDAZOLE (FLAGYL) IVPB 500 mg        500 mg 100 mL/hr over 60 Minutes Intravenous  Once 05/06/21 1621 05/06/21 1728   05/06/21 1615  piperacillin-tazobactam (ZOSYN) IVPB 2.25 g  Status:  Discontinued        2.25 g 100 mL/hr over 30 Minutes Intravenous  Once 05/06/21 1614 05/06/21 1621      Culture/Microbiology    Component Value Date/Time   SDES TISSUE 05/08/2021 0810   SPECREQUEST TIS FROM GREAT LEFT TOE SPEC A 05/08/2021 0810   CULT  05/08/2021 0810    MODERATE STAPHYLOCOCCUS AUREUS NO ANAEROBES ISOLATED Performed at [facility], [place] [address], [place], [place] [address]    REPTSTATUS 05/13/2021 FINAL 05/08/2021 7026    Other culture-see note  Unresulted Labs (From admission, onward)     Start     Ordered   05/14/21 0500   Heparin level (unfractionated)  Daily,   R     Question:  Specimen collection method  Answer:  Lab=Lab collect   05/12/21 2342   05/14/21 0500  Renal function panel  Tomorrow morning,   R       Question:  Specimen collection method  Answer:  Lab=Lab collect   05/13/21 1055   05/14/21 0500  CBC  Tomorrow morning,   R       Question:  Specimen collection method  Answer:  Lab=Lab collect   05/13/21 1055   05/13/21 1700  Heparin level (unfractionated)  Once-Timed,   TIMED       Question:  Specimen collection method  Answer:  Lab=Lab collect   05/13/21 0850   05/12/21 1053  Protein C activity  (Hypercoagulable Panel, Comprehensive (PNL))  Once,   R       Question:  Specimen collection method  Answer:  Lab=Lab collect   05/12/21 1052   05/12/21 1053  Protein C, total  (Hypercoagulable Panel, Comprehensive (PNL))  Once,   R       Question:  Specimen collection method  Answer:  Lab=Lab collect   05/12/21 1052   05/12/21 1053  Protein S activity  (Hypercoagulable Panel, Comprehensive (PNL))  Once,   R       Question:  Specimen collection method  Answer:  Lab=Lab collect   05/12/21 1052   05/12/21 1053  Protein S, total  (Hypercoagulable Panel, Comprehensive (PNL))  Once,   R       Question:  Specimen collection method  Answer:  Lab=Lab collect   05/12/21 1052   05/12/21 1053  Lupus anticoagulant panel  (Hypercoagulable Panel, Comprehensive (PNL))  Once,   R       Question:  Specimen collection method  Answer:  Lab=Lab collect   05/12/21 1052  05/12/21 1053  Beta-2-glycoprotein i abs, IgG/M/A  (Hypercoagulable Panel, Comprehensive (PNL))  Once,   R       Question:  Specimen collection method  Answer:  Lab=Lab collect   05/12/21 1052   05/12/21 1053  Homocysteine, serum  (Hypercoagulable Panel, Comprehensive (PNL))  Once,   R       Question:  Specimen collection method  Answer:  Lab=Lab collect   05/12/21 1052   05/12/21 1053  Factor 5 leiden  (Hypercoagulable Panel, Comprehensive (PNL))   Once,   R       Question:  Specimen collection method  Answer:  Lab=Lab collect   05/12/21 1052   05/12/21 1053  Prothrombin gene mutation  (Hypercoagulable Panel, Comprehensive (PNL))  Once,   R       Question:  Specimen collection method  Answer:  Lab=Lab collect   05/12/21 1052   05/12/21 1053  Cardiolipin antibodies, IgG, IgM, IgA  (Hypercoagulable Panel, Comprehensive (PNL))  Once,   R       Question:  Specimen collection method  Answer:  Lab=Lab collect   05/12/21 1052   Signed and Held  Renal function panel  Once,   R       Question:  Specimen collection method  Answer:  Lab=Lab collect   Signed and Held   Signed and Held  CBC  Once,   R       Question:  Specimen collection method  Answer:  Lab=Lab collect   Signed and Held   Signed and Held  Renal function panel  Once,   R       Question:  Specimen collection method  Answer:  Lab=Lab collect   Signed and Held   Signed and Held  CBC  Once,   R       Question:  Specimen collection method  Answer:  Lab=Lab collect   Signed and Held           Data Reviewed: I have personally reviewed following labs and imaging studies  CBC: Recent Labs  Lab 05/06/21 1600 05/07/21 0312 05/08/21 0621 05/09/21 0232 05/10/21 0331 05/11/21 0824 05/12/21 0346  WBC 19.5*   < > 19.7* 12.6* 9.7 10.0 11.1*  NEUTROABS 15.0*  --   --   --   --   --   --   HGB 14.3   < > 11.9* 11.4* 12.0* 11.3* 13.8  HCT 43.0   < > 35.9* 35.9* 37.0* 35.0* 40.1  MCV 84.0   < > 83.7 84.3 84.3 83.7 83.7  PLT 226   < > 237 230 299 362 347   < > = values in this interval not displayed.    Basic Metabolic Panel: Recent Labs  Lab 05/06/21 1600 05/07/21 0312 05/08/21 0621 05/11/21 0824 05/12/21 0346  NA 134* 132* 134* 133* 136  K 3.8 3.2* 3.7 3.9 3.7  CL 92* 94* 93* 97* 97*  CO2 25 23 22 22 24   GLUCOSE 163* 158* 109* 199* 111*  BUN 30* 32* 47* 63* 46*  CREATININE 11.66* 13.15* 16.36* 16.25* 12.87*  CALCIUM 9.7 9.2 8.8* 8.9 9.7  PHOS  --   --   --  4.0   --     GFR: Estimated Creatinine Clearance: 12.5 mL/min (A) (by C-G formula based on SCr of 12.87 mg/dL (H)). Liver Function Tests: Recent Labs  Lab 05/06/21 1600 05/11/21 0824  AST 21  --   ALT 12  --   ALKPHOS 69  --  BILITOT 0.7  --   PROT 9.8*  --   ALBUMIN 4.3 2.9*    Coagulation Profile: Recent Labs  Lab 05/06/21 1600  INR 1.3*      CBG: Recent Labs  Lab 05/12/21 1146 05/12/21 1609 05/12/21 1928 05/13/21 0623 05/13/21 1119  GLUCAP 260* 111* 199* 175* 137*     Sepsis Labs: Recent Labs  Lab 05/06/21 1600 05/06/21 1740 05/07/21 0312 05/07/21 0610  LATICACIDVEN 2.3* 2.0* 1.0 1.2     Recent Results (from the past 240 hour(s))  Resp Panel by RT-PCR (Flu A&B, Covid) Nasopharyngeal Swab     Status: None   Collection Time: 05/06/21  3:37 PM   Specimen: Nasopharyngeal Swab; Nasopharyngeal(NP) swabs in vial transport medium  Result Value Ref Range Status   SARS Coronavirus 2 by RT PCR NEGATIVE NEGATIVE Final    Comment: (NOTE) SARS-CoV-2 target nucleic acids are NOT DETECTED.  The SARS-CoV-2 RNA is generally detectable in upper respiratory specimens during the acute phase of infection. The lowest concentration of SARS-CoV-2 viral copies this assay can detect is 138 copies/mL. A negative result does not preclude SARS-Cov-2 infection and should not be used as the sole basis for treatment or other patient management decisions. A negative result may occur with  improper specimen collection/handling, submission of specimen other than nasopharyngeal swab, presence of viral mutation(s) within the areas targeted by this assay, and inadequate number of viral copies(<138 copies/mL). A negative result must be combined with clinical observations, patient history, and epidemiological information. The expected result is Negative.  Fact Sheet for Patients:  EntrepreneurPulse.com.au  Fact Sheet for Healthcare Providers:   IncredibleEmployment.be  This test is no t yet approved or cleared by the [place] FDA and  has been authorized for detection and/or diagnosis of SARS-CoV-2 by FDA under an Emergency Use Authorization (EUA). This EUA will remain  in effect (meaning this test can be used) for the duration of the COVID-19 declaration under Section 564(b)(1) of the Act, 21 U.S.C.section 360bbb-3(b)(1), unless the authorization is terminated  or revoked sooner.       Influenza A by PCR NEGATIVE NEGATIVE Final   Influenza B by PCR NEGATIVE NEGATIVE Final    Comment: (NOTE) The Xpert Xpress SARS-CoV-2/FLU/RSV plus assay is intended as an aid in the diagnosis of influenza from Nasopharyngeal swab specimens and should not be used as a sole basis for treatment. Nasal washings and aspirates are unacceptable for Xpert Xpress SARS-CoV-2/FLU/RSV testing.  Fact Sheet for Patients: EntrepreneurPulse.com.au  Fact Sheet for Healthcare Providers: IncredibleEmployment.be  This test is not yet approved or cleared by the [place] FDA and has been authorized for detection and/or diagnosis of SARS-CoV-2 by FDA under an Emergency Use Authorization (EUA). This EUA will remain in effect (meaning this test can be used) for the duration of the COVID-19 declaration under Section 564(b)(1) of the Act, 21 U.S.C. section 360bbb-3(b)(1), unless the authorization is terminated or revoked.  Performed at [facility], [address] [place], [place] [address]   Culture, blood (x 2)     Status: Abnormal   Collection Time: 05/06/21  4:00 PM   Specimen: BLOOD  Result Value Ref Range Status   Specimen Description   Final    BLOOD BLOOD RIGHT HAND Performed at [facility], [address] [place], [place] [address]    Special Requests   Final    BOTTLES DRAWN AEROBIC ONLY Blood Culture results may not be optimal due to an  inadequate volume  of blood received in culture bottles Performed at [facility], [address] [place], [place] [address]    Culture  Setup Time   Final    GRAM POSITIVE COCCI AEROBIC BOTTLE ONLY CRITICAL RESULT CALLED TO, READ BACK BY AND VERIFIED WITH: PHARMD GREG ABBOTT 05/08/21@4 :17 BY TW    Culture (A)  Final    STAPHYLOCOCCUS AUREUS SUSCEPTIBILITIES PERFORMED ON PREVIOUS CULTURE WITHIN THE LAST 5 DAYS. Performed at [facility], [place] [address] [place], [place] [address]    Report Status 05/09/2021 FINAL  Final  Blood Culture ID Panel (Reflexed)     Status: Abnormal   Collection Time: 05/06/21  4:00 PM  Result Value Ref Range Status   Enterococcus faecalis NOT DETECTED NOT DETECTED Final   Enterococcus Faecium NOT DETECTED NOT DETECTED Final   Listeria monocytogenes NOT DETECTED NOT DETECTED Final   Staphylococcus species DETECTED (A) NOT DETECTED Final    Comment: CRITICAL RESULT CALLED TO, READ BACK BY AND VERIFIED WITH: PHARMD GREG ABBOTT 05/08/21@4 :17 BY TW    Staphylococcus aureus (BCID) DETECTED (A) NOT DETECTED Final    Comment: CRITICAL RESULT CALLED TO, READ BACK BY AND VERIFIED WITH: PHARMD GREG ABBOTT 05/08/21@4 :17 BY TW    Staphylococcus epidermidis NOT DETECTED NOT DETECTED Final   Staphylococcus lugdunensis NOT DETECTED NOT DETECTED Final   Streptococcus species NOT DETECTED NOT DETECTED Final   Streptococcus agalactiae NOT DETECTED NOT DETECTED Final   Streptococcus pneumoniae NOT DETECTED NOT DETECTED Final   Streptococcus pyogenes NOT DETECTED NOT DETECTED Final   A.calcoaceticus-baumannii NOT DETECTED NOT DETECTED Final   Bacteroides fragilis NOT DETECTED NOT DETECTED Final   Enterobacterales NOT DETECTED NOT DETECTED Final   Enterobacter cloacae complex NOT DETECTED NOT DETECTED Final   Escherichia coli NOT DETECTED NOT DETECTED Final   Klebsiella aerogenes NOT DETECTED NOT DETECTED Final   Klebsiella oxytoca NOT DETECTED NOT DETECTED Final    Klebsiella pneumoniae NOT DETECTED NOT DETECTED Final   Proteus species NOT DETECTED NOT DETECTED Final   Salmonella species NOT DETECTED NOT DETECTED Final   Serratia marcescens NOT DETECTED NOT DETECTED Final   Haemophilus influenzae NOT DETECTED NOT DETECTED Final   Neisseria meningitidis NOT DETECTED NOT DETECTED Final   Pseudomonas aeruginosa NOT DETECTED NOT DETECTED Final   Stenotrophomonas maltophilia NOT DETECTED NOT DETECTED Final   Candida albicans NOT DETECTED NOT DETECTED Final   Candida auris NOT DETECTED NOT DETECTED Final   Candida glabrata NOT DETECTED NOT DETECTED Final   Candida krusei NOT DETECTED NOT DETECTED Final   Candida parapsilosis NOT DETECTED NOT DETECTED Final   Candida tropicalis NOT DETECTED NOT DETECTED Final   Cryptococcus neoformans/gattii NOT DETECTED NOT DETECTED Final   Meth resistant mecA/C and MREJ NOT DETECTED NOT DETECTED Final    Comment: Performed at [facility], [address] [place], [place] [address]  Culture, blood (x 2)     Status: Abnormal   Collection Time: 05/06/21  4:15 PM   Specimen: BLOOD  Result Value Ref Range Status   Specimen Description   Final    BLOOD BLOOD RIGHT HAND Performed at [facility], [address] [place], [place] [address]    Special Requests   Final    BOTTLES DRAWN AEROBIC AND ANAEROBIC Blood Culture adequate volume Performed at [facility], [address], [place], [place] [address]    Culture  Setup Time   Final  GRAM POSITIVE COCCI IN CLUSTERS IN BOTH AEROBIC AND ANAEROBIC BOTTLES CRITICAL RESULT CALLED TO, READ BACK BY AND VERIFIED WITH: PHARMD J.FRENS AT 3295 ON 05/07/2021 BY T.SAAD. Performed at [facility], [place] [address] [place], [place] [address]    Culture STAPHYLOCOCCUS AUREUS (A)  Final   Report Status 05/09/2021 FINAL  Final   Organism ID, Bacteria STAPHYLOCOCCUS AUREUS  Final      Susceptibility   Staphylococcus aureus - MIC*    CIPROFLOXACIN  >=8 RESISTANT Resistant     ERYTHROMYCIN <=0.25 SENSITIVE Sensitive     GENTAMICIN <=0.5 SENSITIVE Sensitive     OXACILLIN <=0.25 SENSITIVE Sensitive     TETRACYCLINE <=1 SENSITIVE Sensitive     VANCOMYCIN 1 SENSITIVE Sensitive     TRIMETH/SULFA <=10 SENSITIVE Sensitive     CLINDAMYCIN <=0.25 SENSITIVE Sensitive     RIFAMPIN <=0.5 SENSITIVE Sensitive     Inducible Clindamycin NEGATIVE Sensitive     * STAPHYLOCOCCUS AUREUS  Blood Culture ID Panel (Reflexed)     Status: Abnormal   Collection Time: 05/06/21  4:15 PM  Result Value Ref Range Status   Enterococcus faecalis NOT DETECTED NOT DETECTED Final   Enterococcus Faecium NOT DETECTED NOT DETECTED Final   Listeria monocytogenes NOT DETECTED NOT DETECTED Final   Staphylococcus species DETECTED (A) NOT DETECTED Final    Comment: CRITICAL RESULT CALLED TO, READ BACK BY AND VERIFIED WITH: PHARMD J.FRENS AT 6606 ON 05/07/2021 BY T.SAAD.    Staphylococcus aureus (BCID) DETECTED (A) NOT DETECTED Final    Comment: CRITICAL RESULT CALLED TO, READ BACK BY AND VERIFIED WITH: PHARMD J.FRENS AT 3016 ON 05/07/2021 BY T.SAAD.    Staphylococcus epidermidis NOT DETECTED NOT DETECTED Final   Staphylococcus lugdunensis NOT DETECTED NOT DETECTED Final   Streptococcus species NOT DETECTED NOT DETECTED Final   Streptococcus agalactiae NOT DETECTED NOT DETECTED Final   Streptococcus pneumoniae NOT DETECTED NOT DETECTED Final   Streptococcus pyogenes NOT DETECTED NOT DETECTED Final   A.calcoaceticus-baumannii NOT DETECTED NOT DETECTED Final   Bacteroides fragilis NOT DETECTED NOT DETECTED Final   Enterobacterales NOT DETECTED NOT DETECTED Final   Enterobacter cloacae complex NOT DETECTED NOT DETECTED Final   Escherichia coli NOT DETECTED NOT DETECTED Final   Klebsiella aerogenes NOT DETECTED NOT DETECTED Final   Klebsiella oxytoca NOT DETECTED NOT DETECTED Final   Klebsiella pneumoniae NOT DETECTED NOT DETECTED Final   Proteus species NOT DETECTED NOT  DETECTED Final   Salmonella species NOT DETECTED NOT DETECTED Final   Serratia marcescens NOT DETECTED NOT DETECTED Final   Haemophilus influenzae NOT DETECTED NOT DETECTED Final   Neisseria meningitidis NOT DETECTED NOT DETECTED Final   Pseudomonas aeruginosa NOT DETECTED NOT DETECTED Final   Stenotrophomonas maltophilia NOT DETECTED NOT DETECTED Final   Candida albicans NOT DETECTED NOT DETECTED Final   Candida auris NOT DETECTED NOT DETECTED Final   Candida glabrata NOT DETECTED NOT DETECTED Final   Candida krusei NOT DETECTED NOT DETECTED Final   Candida parapsilosis NOT DETECTED NOT DETECTED Final   Candida tropicalis NOT DETECTED NOT DETECTED Final   Cryptococcus neoformans/gattii NOT DETECTED NOT DETECTED Final   Meth resistant mecA/C and MREJ NOT DETECTED NOT DETECTED Final    Comment: Performed at [facility], [address] [place], [place] [address]  Aerobic/Anaerobic Culture w Gram Stain (surgical/deep wound)     Status: None   Collection Time: 05/06/21  7:47 PM   Specimen: Toe; Wound  Result Value Ref Range Status  Specimen Description   Final    TOE Performed at [facility], [address] [place], [place] [address]    Special Requests   Final    Immunocompromised Performed at [facility], [address] [place], [place] [address]    Gram Stain   Final    NO WBC SEEN MODERATE GRAM POSITIVE COCCI IN PAIRS    Culture   Final    ABUNDANT STAPHYLOCOCCUS AUREUS MODERATE ENTEROCOCCUS FAECALIS ABUNDANT BACTEROIDES FRAGILIS BETA LACTAMASE POSITIVE Performed at [facility], [place] [address], [place], [place] [address]    Report Status 05/10/2021 FINAL  Final   Organism ID, Bacteria STAPHYLOCOCCUS AUREUS  Final   Organism ID, Bacteria ENTEROCOCCUS FAECALIS  Final      Susceptibility   Enterococcus faecalis - MIC*    AMPICILLIN <=2 SENSITIVE Sensitive     VANCOMYCIN 2 SENSITIVE Sensitive     GENTAMICIN SYNERGY SENSITIVE  Sensitive     * MODERATE ENTEROCOCCUS FAECALIS   Staphylococcus aureus - MIC*    CIPROFLOXACIN >=8 RESISTANT Resistant     ERYTHROMYCIN <=0.25 SENSITIVE Sensitive     GENTAMICIN <=0.5 SENSITIVE Sensitive     OXACILLIN <=0.25 SENSITIVE Sensitive     TETRACYCLINE <=1 SENSITIVE Sensitive     VANCOMYCIN <=0.5 SENSITIVE Sensitive     TRIMETH/SULFA <=10 SENSITIVE Sensitive     CLINDAMYCIN <=0.25 SENSITIVE Sensitive     RIFAMPIN <=0.5 SENSITIVE Sensitive     Inducible Clindamycin NEGATIVE Sensitive     * ABUNDANT STAPHYLOCOCCUS AUREUS  MRSA Next Gen by PCR, Nasal     Status: None   Collection Time: 05/08/21  2:17 AM   Specimen: Nasal Mucosa; Nasal Swab  Result Value Ref Range Status   MRSA by PCR Next Gen NOT DETECTED NOT DETECTED Final    Comment: (NOTE) The GeneXpert MRSA Assay (FDA approved for NASAL specimens only), is one component of a comprehensive MRSA colonization surveillance program. It is not intended to diagnose MRSA infection nor to guide or monitor treatment for MRSA infections. Test performance is not FDA approved in patients less than 45 years old. Performed at [facility] Lab, [place] [address], [place], [place] [address]   Culture, blood (routine x 2)     Status: None   Collection Time: 05/08/21  6:11 AM   Specimen: BLOOD RIGHT HAND  Result Value Ref Range Status   Specimen Description BLOOD RIGHT HAND  Final   Special Requests   Final    BOTTLES DRAWN AEROBIC AND ANAEROBIC Blood Culture results may not be optimal due to an inadequate volume of blood received in culture bottles   Culture   Final    NO GROWTH 5 DAYS Performed at [facility], [place] [address], [place], [place], [place] [address]    Report Status 05/13/2021 FINAL  Final  Culture, blood (routine x 2)     Status: None   Collection Time: 05/08/21  6:26 AM   Specimen: BLOOD  Result Value Ref Range Status   Specimen Description BLOOD LEFT ANTECUBITAL  Final   Special Requests AEROBIC BOTTLE ONLY Blood  Culture adequate volume  Final   Culture   Final    NO GROWTH 5 DAYS Performed at [facility] [address] [place], [place] [address]    Report Status 05/13/2021 FINAL  Final  Aerobic/Anaerobic Culture w Gram Stain (surgical/deep wound)     Status: None   Collection Time: 05/08/21  8:10 AM   Specimen: PATH Other; Tissue  Result Value Ref Range Status   Specimen Description TISSUE  Final   Special Requests TIS FROM GREAT LEFT TOE SPEC A  Final   Gram Stain   Final    FEW WBC PRESENT, PREDOMINANTLY PMN FEW GRAM POSITIVE COCCI    Culture   Final    MODERATE STAPHYLOCOCCUS AUREUS NO ANAEROBES ISOLATED Performed at [facility], [place] [address] [place], [place] [address]    Report Status 05/13/2021 FINAL  Final   Organism ID, Bacteria STAPHYLOCOCCUS AUREUS  Final      Susceptibility   Staphylococcus aureus - MIC*    CIPROFLOXACIN >=8 RESISTANT Resistant     ERYTHROMYCIN <=0.25 SENSITIVE Sensitive     GENTAMICIN <=0.5 SENSITIVE Sensitive     OXACILLIN <=0.25 SENSITIVE Sensitive     TETRACYCLINE <=1 SENSITIVE Sensitive     VANCOMYCIN 1 SENSITIVE Sensitive     TRIMETH/SULFA <=10 SENSITIVE Sensitive     CLINDAMYCIN <=0.25 SENSITIVE Sensitive     RIFAMPIN <=0.5 SENSITIVE Sensitive     Inducible Clindamycin NEGATIVE Sensitive     * MODERATE STAPHYLOCOCCUS AUREUS      Radiology Studies: IR Removal Tun Cv Cath W/O FL  Result Date: 05/11/2021 INDICATION: Patient with a history of end-stage renal disease with right IJ tunneled dialysis catheter placed in IR March 26, 2021. Patient now admitted with bacteremia. Interventional radiology asked to remove tunneled dialysis catheter for line holiday. Patient with plans for new tunneled dialysis catheter placement 05/13/2021. EXAM: REMOVAL TUNNELED CENTRAL VENOUS CATHETER MEDICATIONS: 1% lidocaine 5 mL ANESTHESIA/SEDATION: None FLUOROSCOPY TIME:  None COMPLICATIONS: None immediate. PROCEDURE: Informed written consent was  obtained from the patient after a thorough discussion of the procedural risks, benefits and alternatives. All questions were addressed. Maximal Sterile Barrier Technique was utilized including caps, mask, sterile gowns, sterile gloves, sterile drape, hand hygiene and skin antiseptic. A timeout was performed prior to the initiation of the procedure. The patient's right chest and catheter was prepped and draped in a normal sterile fashion. Heparin was removed from both ports of catheter. 1% lidocaine was used for local anesthesia. Using gentle blunt dissection and moderate manual traction the cuff of the catheter was exposed and the catheter was removed in it's entirety. Pressure was held till hemostasis was obtained. A sterile dressing was applied. The patient tolerated the procedure well with no immediate complications. IMPRESSION: Successful catheter removal as described above. Read by: Soyla Dryer, NP Electronically Signed   By: Sandi Mariscal M.D.   On: 05/11/2021 16:40   ECHO TEE  Result Date: 05/12/2021    TRANSESOPHOGEAL ECHO REPORT   Patient Name:   HAMSA LAURICH Date of Exam: 05/12/2021 Medical Rec #:  [id number]     Height:       73.0 in Accession #:    [id number]    Weight:       304.7 lb Date of Birth:  [birth year]      BSA:          2.573 m Patient Age:    30 years      BP:           118/89 mmHg Patient Gender: M             HR:           98 bpm. Exam Location:  Inpatient Procedure: Transesophageal Echo, 3D Echo, Color Doppler and Cardiac Doppler Indications:     Bacteremia  History:  Patient has prior history of Echocardiogram examinations, most                  recent 05/09/2021. Risk Factors:Hypertension, Dyslipidemia and                  Diabetes.  Sonographer:     Bernadene Person RDCS Referring Phys:  1993 RHONDA G BARRETT Diagnosing Phys: Rudean Haskell MD PROCEDURE: After discussion of the risks and benefits of a TEE, an informed consent was obtained from the patient. The  transesophogeal probe was passed without difficulty through the esophogus of the patient. Local oropharyngeal anesthetic was provided with Cetacaine. Sedation performed by different physician. The patient was monitored while under deep sedation. Anesthestetic sedation was provided intravenously by Anesthesiology: 617.52mg  of Propofol, 60mg  of Lidocaine. The patient developed no complications during the procedure. IMPRESSIONS  1. There is a large, echodensity 22 mm X 41mm in the SVC, with a long filamentous stalk. The stalk is adjacent to the tricuspid valve at the tip but appears to have a connection to the SVC lesion.  2. Left ventricular ejection fraction, by estimation, is 65 to 70%. The left ventricle has normal function.  3. Right ventricular systolic function is normal. The right ventricular size is normal.  4. No left atrial/left atrial appendage thrombus was detected.  5. The mitral valve is normal in structure. No evidence of mitral valve regurgitation.  6. The aortic valve is tricuspid. Aortic valve regurgitation is not visualized. No aortic stenosis is present. Conclusion(s)/Recommendation(s): In the setting of bacteremia and recent central line, cannot exclude septic thrombus arising from the SVC. Repeat imaging after therapy (TEE vs Cardiac CT) is reasonable. Reached out to primary and ID team post procedure. FINDINGS  Left Ventricle: Left ventricular ejection fraction, by estimation, is 65 to 70%. The left ventricle has normal function. The left ventricular internal cavity size was normal in size. Right Ventricle: The right ventricular size is normal. No increase in right ventricular wall thickness. Right ventricular systolic function is normal. Left Atrium: Left atrial size was normal in size. No left atrial/left atrial appendage thrombus was detected. Right Atrium: Right atrial size was not well visualized. Pericardium: There is no evidence of pericardial effusion. Mitral Valve: The mitral valve is  normal in structure. No evidence of mitral valve regurgitation. Tricuspid Valve: The tricuspid valve is abnormal. Tricuspid valve regurgitation is not demonstrated. Aortic Valve: The aortic valve is tricuspid. Aortic valve regurgitation is not visualized. No aortic stenosis is present. Pulmonic Valve: The pulmonic valve was normal in structure. Pulmonic valve regurgitation is not visualized. Aorta: The aortic root and ascending aorta are structurally normal, with no evidence of dilitation. IAS/Shunts: The atrial septum is grossly normal.   3D Volume EF LV 3D EF:    69.60 % LV 3D EDV:   234100.00 mm LV 3D ESV:   71200.00 mm LV 3D SV:    163000.00 mm Rudean Haskell MD Electronically signed by Rudean Haskell MD Signature Date/Time: 05/12/2021/6:44:44 PM    Final      LOS: 6 days   Bonnielee Haff, MD [facility]  05/13/2021, 11:21 AM

## 2021-05-13 NOTE — Progress Notes (Signed)
Pharmacy to reschedule heparin gtt when patient back to unit from HD.  This RN explained to night shift RN to call pharmacy when patient arrives back to floor for them to reschedule hep gtt.

## 2021-05-14 ENCOUNTER — Encounter (HOSPITAL_COMMUNITY): Payer: Self-pay

## 2021-05-14 ENCOUNTER — Other Ambulatory Visit (HOSPITAL_COMMUNITY): Payer: Self-pay

## 2021-05-14 DIAGNOSIS — L089 Local infection of the skin and subcutaneous tissue, unspecified: Secondary | ICD-10-CM | POA: Diagnosis not present

## 2021-05-14 DIAGNOSIS — B9561 Methicillin susceptible Staphylococcus aureus infection as the cause of diseases classified elsewhere: Secondary | ICD-10-CM | POA: Diagnosis not present

## 2021-05-14 DIAGNOSIS — E11628 Type 2 diabetes mellitus with other skin complications: Secondary | ICD-10-CM | POA: Diagnosis not present

## 2021-05-14 DIAGNOSIS — I82210 Acute embolism and thrombosis of superior vena cava: Secondary | ICD-10-CM | POA: Diagnosis not present

## 2021-05-14 DIAGNOSIS — Z992 Dependence on renal dialysis: Secondary | ICD-10-CM | POA: Diagnosis not present

## 2021-05-14 DIAGNOSIS — Z794 Long term (current) use of insulin: Secondary | ICD-10-CM | POA: Diagnosis not present

## 2021-05-14 DIAGNOSIS — R7881 Bacteremia: Secondary | ICD-10-CM | POA: Diagnosis not present

## 2021-05-14 DIAGNOSIS — N186 End stage renal disease: Secondary | ICD-10-CM | POA: Diagnosis not present

## 2021-05-14 DIAGNOSIS — E1129 Type 2 diabetes mellitus with other diabetic kidney complication: Secondary | ICD-10-CM | POA: Diagnosis not present

## 2021-05-14 DIAGNOSIS — I120 Hypertensive chronic kidney disease with stage 5 chronic kidney disease or end stage renal disease: Secondary | ICD-10-CM | POA: Diagnosis not present

## 2021-05-14 DIAGNOSIS — N250 Renal osteodystrophy: Secondary | ICD-10-CM | POA: Diagnosis not present

## 2021-05-14 DIAGNOSIS — A419 Sepsis, unspecified organism: Secondary | ICD-10-CM | POA: Diagnosis not present

## 2021-05-14 LAB — RENAL FUNCTION PANEL
Albumin: 3.3 g/dL — ABNORMAL LOW (ref 3.5–5.0)
Anion gap: 14 (ref 5–15)
BUN: 45 mg/dL — ABNORMAL HIGH (ref 6–20)
CO2: 21 mmol/L — ABNORMAL LOW (ref 22–32)
Calcium: 9.3 mg/dL (ref 8.9–10.3)
Chloride: 99 mmol/L (ref 98–111)
Creatinine, Ser: 12.74 mg/dL — ABNORMAL HIGH (ref 0.61–1.24)
GFR, Estimated: 5 mL/min — ABNORMAL LOW (ref >60)
Glucose, Bld: 114 mg/dL — ABNORMAL HIGH (ref 70–99)
Phosphorus: 4.9 mg/dL — ABNORMAL HIGH (ref 2.5–4.6)
Potassium: 4.3 mmol/L (ref 3.5–5.1)
Sodium: 134 mmol/L — ABNORMAL LOW (ref 135–145)

## 2021-05-14 LAB — CBC
HCT: 36.6 % — ABNORMAL LOW (ref 39.0–52.0)
Hemoglobin: 11.9 g/dL — ABNORMAL LOW (ref 13.0–17.0)
MCH: 27.1 pg (ref 26.0–34.0)
MCHC: 32.5 g/dL (ref 30.0–36.0)
MCV: 83.4 fL (ref 80.0–100.0)
Platelets: 346 10*3/uL (ref 150–400)
RBC: 4.39 MIL/uL (ref 4.22–5.81)
RDW: 17.5 % — ABNORMAL HIGH (ref 11.5–15.5)
WBC: 11.5 10*3/uL — ABNORMAL HIGH (ref 4.0–10.5)
nRBC: 0 % (ref 0.0–0.2)

## 2021-05-14 LAB — HEPARIN LEVEL (UNFRACTIONATED): Heparin Unfractionated: 0.4 IU/mL (ref 0.30–0.70)

## 2021-05-14 LAB — CARDIOLIPIN ANTIBODIES, IGG, IGM, IGA
Anticardiolipin IgA: <9 APL U/mL (ref 0–11)
Anticardiolipin IgG: 18 GPL U/mL — ABNORMAL HIGH (ref 0–14)
Anticardiolipin IgM: <9 MPL U/mL (ref 0–12)

## 2021-05-14 LAB — GLUCOSE, CAPILLARY
Glucose-Capillary: 121 mg/dL — ABNORMAL HIGH (ref 70–99)
Glucose-Capillary: 112 mg/dL — ABNORMAL HIGH (ref 70–99)

## 2021-05-14 MED ORDER — CEFAZOLIN SODIUM-DEXTROSE 2-4 GM/100ML-% IV SOLN: 2.0000 g | INTRAVENOUS | Status: AC

## 2021-05-14 MED ORDER — OXYCODONE-ACETAMINOPHEN 7.5-325 MG PO TABS: 1.0000 | ORAL_TABLET | Freq: Four times a day (QID) | ORAL | 0 refills | Status: DC | PRN

## 2021-05-14 MED ORDER — GABAPENTIN 100 MG PO CAPS: 100.0000 mg | ORAL_CAPSULE | Freq: Three times a day (TID) | ORAL | 1 refills | Status: DC

## 2021-05-14 MED ORDER — MIDODRINE HCL 5 MG PO TABS: 5.0000 mg | ORAL_TABLET | Freq: Three times a day (TID) | ORAL | 2 refills | Status: DC

## 2021-05-14 MED ORDER — APIXABAN 5 MG PO TABS
5.0000 mg | ORAL_TABLET | Freq: Two times a day (BID) | ORAL | 2 refills | Status: DC
  Filled 2021-05-14: qty 60, 30d supply, fill #0

## 2021-05-14 MED ORDER — INSULIN GLARGINE 100 UNIT/ML ~~LOC~~ SOLN: 8.0000 [IU] | Freq: Every day | SUBCUTANEOUS | 2 refills | Status: DC

## 2021-05-14 MED ORDER — APIXABAN 5 MG PO TABS
10.0000 mg | ORAL_TABLET | Freq: Two times a day (BID) | ORAL | Status: DC
  Administered 2021-05-14: 11:00:00 10 mg via ORAL
  Filled 2021-05-14: qty 2

## 2021-05-14 MED ORDER — APIXABAN 5 MG PO TABS: 5.0000 mg | ORAL_TABLET | Freq: Two times a day (BID) | ORAL | Status: DC

## 2021-05-14 MED ORDER — POLYETHYLENE GLYCOL 3350 17 G PO PACK: 17.0000 g | PACK | Freq: Every day | ORAL | 0 refills | Status: DC | PRN

## 2021-05-14 MED ORDER — APIXABAN (ELIQUIS) VTE STARTER PACK (10MG AND 5MG)
ORAL_TABLET | ORAL | 0 refills | Status: DC
  Filled 2021-05-14: qty 74, 30d supply, fill #0

## 2021-05-14 MED ORDER — POLYETHYLENE GLYCOL 3350 17 G PO PACK
17.0000 g | PACK | Freq: Every day | ORAL | Status: DC
  Administered 2021-05-14: 11:00:00 17 g via ORAL
  Filled 2021-05-14: qty 1

## 2021-05-14 MED ORDER — AMOXICILLIN-POT CLAVULANATE 500-125 MG PO TABS: 1.0000 | ORAL_TABLET | Freq: Every day | ORAL | 0 refills | Status: AC

## 2021-05-14 NOTE — Progress Notes (Signed)
Patient ID: Paul [name], male   DOB: December 30, 1990, 30 y.o.   MRN: [id number] S: No events overnight. Eager to go home, possible d/c today? O:BP (!) 81/16 (BP Location: Right Wrist)    Pulse 92    Temp 98.5 F (36.9 C) (Oral)    Resp 16    Ht _0  (1.854 m)    Wt (!) 148 kg    SpO2 100%    BMI 43.05 kg/m   Intake/Output Summary (Last 24 hours) at 05/14/2021 0917 Last data filed at 05/13/2021 2216 Gross per 24 hour  Intake 677.42 ml  Output 2506 ml  Net -1828.58 ml   Intake/Output: I/O last 3 completed shifts: In: 797.4 [P.O.:120; I.V.:454.1; IV Piggyback:223.3] Out: 2506 [Other:2506]  Intake/Output this shift:  No intake/output data recorded. Weight change: 12.4 kg Gen: NAD CVS: RRR Resp:CTA Abd: +BS, soft, NT/Nd Ext: no edema  Recent Labs  Lab 05/08/21 0621 05/11/21 0824 05/12/21 0346 05/14/21 0611  NA 134* 133* 136 134*  K 3.7 3.9 3.7 4.3  CL 93* 97* 97* 99  CO2 _1 21*  GLUCOSE 109* 199* 111* 114*  BUN 47* 63* 46* 45*  CREATININE 16.36* 16.25* 12.87* 12.74*  ALBUMIN  --  2.9*  --  3.3*  CALCIUM 8.8* 8.9 9.7 9.3  PHOS  --  4.0  --  4.9*   Liver Function Tests: Recent Labs  Lab 05/11/21 0824 05/14/21 0611  ALBUMIN 2.9* 3.3*   No results for input(s): LIPASE, AMYLASE in the last 168 hours. No results for input(s): AMMONIA in the last 168 hours. CBC: Recent Labs  Lab 05/09/21 0232 05/10/21 0331 05/11/21 0824 05/12/21 0346 05/14/21 0611  WBC 12.6* 9.7 10.0 11.1* 11.5*  HGB 11.4* 12.0* 11.3* 13.8 11.9*  HCT 35.9* 37.0* 35.0* 40.1 36.6*  MCV 84.3 84.3 83.7 83.7 83.4  PLT 230 299 362 347 346   Cardiac Enzymes: No results for input(s): CKTOTAL, CKMB, CKMBINDEX, TROPONINI in the last 168 hours. CBG: Recent Labs  Lab 05/13/21 0623 05/13/21 1119 05/13/21 1615 05/13/21 2316 05/14/21 0628  GLUCAP 175* 137* 134* 153* 121*    Iron Studies: No results for input(s): IRON, TIBC, TRANSFERRIN, FERRITIN in the last 72 hours. Studies/Results: IR US Guide  Vasc Access Right  Result Date: 05/13/2021 INDICATION: 30 year old male with a history of end-stage renal disease on hemodialysis via a right upper extremity hemodialysis catheter. The catheter was recently removed for systemic bacteremia in the setting of a foot infection. He now presents for catheter replacement. EXAM: TUNNELED CENTRAL VENOUS HEMODIALYSIS CATHETER PLACEMENT WITH ULTRASOUND AND FLUOROSCOPIC GUIDANCE MEDICATIONS: 2 g Ancef. The antibiotic was given in an appropriate time interval prior to skin puncture. ANESTHESIA/SEDATION: Moderate (conscious) sedation was employed during this procedure. A total of Versed 2 mg and Fentanyl 100 mcg was administered intravenously. Moderate Sedation Time: 32 minutes. The patient's level of consciousness and vital signs were monitored continuously by radiology nursing throughout the procedure under my direct supervision. FLUOROSCOPY TIME:  Fluoroscopy Time: 7 minutes 54 seconds (233 mGy). COMPLICATIONS: None immediate. PROCEDURE: Informed written consent was obtained from the patient after a discussion of the risks, benefits, and alternatives to treatment. Questions regarding the procedure were encouraged and answered. The right neck and chest were prepped with chlorhexidine in a sterile fashion, and a sterile drape was applied covering the operative field. Maximum barrier sterile technique with sterile gowns and gloves were used for the procedure. A timeout was performed prior to the initiation of the procedure. After  creating a small venotomy incision, a micropuncture kit was utilized to access the right internal jugular vein under direct, real-time ultrasound guidance after the overlying soft tissues were anesthetized with 1% lidocaine with epinephrine. Ultrasound image documentation was performed for the permanent electronic medical record demonstrating patency of the IJ in the upper neck. Unfortunately, the microwire could not be advanced easily into the right  heart but capped coiling upon itself in the region of the subclavian vein. Therefore, a 4 [place] transitional micro sheath was advanced over the wire. Venotomy was then performed. The right internal jugular vein is essentially occluded. There are fine thread like collaterals ultimately filling into the right innominate vein. A stiff glidewire was advanced through the 4 [place] transitional sheath an after significant manipulation was ultimately forced through the occlusion and into the right heart. The wire was successfully advanced through the right heart and into the inferior vena cava confirming venous access. A 4 French glide catheter was then advanced over the wire and into the right heart. The stiff Glidewire was exchanged for a Lunderquist wire. The soft tissue tract and occluded portion of the jugular vein was then serially dilated and ultimately the 14.5 French peel-away sheath successfully advanced into the right heart. A 23 cm palindrome hemodialysis catheter was then advanced through the peel-away sheath and into the right heart with tips ultimately positioned within the superior aspect of the right atrium. Final catheter positioning was confirmed and documented with a spot radiographic image. The catheter aspirates and flushes normally. The catheter was flushed with appropriate volume heparin dwells. The catheter exit site was secured with a 0-Prolene retention suture. The venotomy incision was closed with an interrupted 4-0 Vicryl, and Dermabond. Dressings were applied. The patient tolerated the procedure well without immediate post procedural complication. IMPRESSION: Successful placement of 23 cm tip to cuff tunneled hemodialysis catheter via the right internal jugular vein with tips terminating within the superior aspect of the right atrium. The catheter is ready for immediate use. Of note, the right internal jugular vein was essentially occluded at the beginning of the case. The occluded segment was  successfully crossed with a stiff hydrophilic wire and serially dilated to allow placement of the hemodialysis catheter. This catheter should be considered a precious venous access. Removal in the future would likely preclude using the right IJ again as a vascular access. Electronically Signed   By: Jacqulynn Cadet M.D.   On: 05/13/2021 17:27   ECHO TEE  Result Date: 05/12/2021    TRANSESOPHOGEAL ECHO REPORT   Patient Name:   Paul [name] Date of Exam: 05/12/2021 Medical Rec #:  [id number]     Height:       73.0 in Accession #:    [id number]    Weight:       304.7 lb Date of Birth:  1990-07-09      BSA:          2.573 m Patient Age:    30 years      BP:           118/89 mmHg Patient Gender: M             HR:           98 bpm. Exam Location:  Inpatient Procedure: Transesophageal Echo, 3D Echo, Color Doppler and Cardiac Doppler Indications:     Bacteremia  History:         Patient has prior history of Echocardiogram examinations, most  recent 05/09/2021. Risk Factors:Hypertension, Dyslipidemia and                  Diabetes.  Sonographer:     Bernadene Person RDCS Referring Phys:  1993 RHONDA G BARRETT Diagnosing Phys: Rudean Haskell MD PROCEDURE: After discussion of the risks and benefits of a TEE, an informed consent was obtained from the patient. The transesophogeal probe was passed without difficulty through the esophogus of the patient. Local oropharyngeal anesthetic was provided with Cetacaine. Sedation performed by different physician. The patient was monitored while under deep sedation. Anesthestetic sedation was provided intravenously by Anesthesiology: 617.50m of Propofol, 655mof Lidocaine. The patient developed no complications during the procedure. IMPRESSIONS  1. There is a large, echodensity 22 mm X 1125mn the SVC, with a long filamentous stalk. The stalk is adjacent to the tricuspid valve at the tip but appears to have a connection to the SVC lesion.  2. Left ventricular  ejection fraction, by estimation, is 65 to 70%. The left ventricle has normal function.  3. Right ventricular systolic function is normal. The right ventricular size is normal.  4. No left atrial/left atrial appendage thrombus was detected.  5. The mitral valve is normal in structure. No evidence of mitral valve regurgitation.  6. The aortic valve is tricuspid. Aortic valve regurgitation is not visualized. No aortic stenosis is present. Conclusion(s)/Recommendation(s): In the setting of bacteremia and recent central line, cannot exclude septic thrombus arising from the SVC. Repeat imaging after therapy (TEE vs Cardiac CT) is reasonable. Reached out to primary and ID team post procedure. FINDINGS  Left Ventricle: Left ventricular ejection fraction, by estimation, is 65 to 70%. The left ventricle has normal function. The left ventricular internal cavity size was normal in size. Right Ventricle: The right ventricular size is normal. No increase in right ventricular wall thickness. Right ventricular systolic function is normal. Left Atrium: Left atrial size was normal in size. No left atrial/left atrial appendage thrombus was detected. Right Atrium: Right atrial size was not well visualized. Pericardium: There is no evidence of pericardial effusion. Mitral Valve: The mitral valve is normal in structure. No evidence of mitral valve regurgitation. Tricuspid Valve: The tricuspid valve is abnormal. Tricuspid valve regurgitation is not demonstrated. Aortic Valve: The aortic valve is tricuspid. Aortic valve regurgitation is not visualized. No aortic stenosis is present. Pulmonic Valve: The pulmonic valve was normal in structure. Pulmonic valve regurgitation is not visualized. Aorta: The aortic root and ascending aorta are structurally normal, with no evidence of dilitation. IAS/Shunts: The atrial septum is grossly normal.   3D Volume EF LV 3D EF:    69.60 % LV 3D EDV:   234100.00 mm LV 3D ESV:   71200.00 mm LV 3D SV:     163000.00 mm MahRudean Haskell Electronically signed by MahRudean Haskell Signature Date/Time: 05/12/2021/6:44:44 PM    Final     amoxicillin-clavulanate  1 tablet Oral QHS   Chlorhexidine Gluconate Cloth  6 each Topical Q0600   cinacalcet  60 mg Oral QODAY   ferric citrate  630 mg Oral TID WC   gabapentin  100 mg Oral TID   insulin aspart  0-6 Units Subcutaneous TID WC & HS   insulin glargine-yfgn  5 Units Subcutaneous Daily   levothyroxine  75 mcg Oral QAC breakfast   melatonin  3 mg Oral QHS   midodrine  5 mg Oral TID WC   multivitamin  1 tablet Oral QPM   polyethylene glycol  17  g Oral Daily   rosuvastatin  20 mg Oral Daily   sodium chloride flush  3 mL Intravenous Q12H    BMET    Component Value Date/Time   NA 134 (L) 05/14/2021 0611   NA 139 06/14/2019 0914   K 4.3 05/14/2021 0611   CL 99 05/14/2021 0611   CO2 21 (L) 05/14/2021 0611   GLUCOSE 114 (H) 05/14/2021 0611   BUN 45 (H) 05/14/2021 0611   BUN 31 (H) 06/14/2019 0914   CREATININE 12.74 (H) 05/14/2021 0611   CREATININE 1.85 (H) 11/28/2014 1153   CALCIUM 9.3 05/14/2021 0611   GFRNONAA 5 (L) 05/14/2021 0611   GFRNONAA 51 (L) 11/28/2014 1153   GFRAA 6 (L) 06/14/2019 0914   GFRAA 58 (L) 11/28/2014 1153   CBC    Component Value Date/Time   WBC 11.5 (H) 05/14/2021 0611   RBC 4.39 05/14/2021 0611   HGB 11.9 (L) 05/14/2021 0611   HCT 36.6 (L) 05/14/2021 0611   PLT 346 05/14/2021 0611   MCV 83.4 05/14/2021 0611   MCH 27.1 05/14/2021 0611   MCHC 32.5 05/14/2021 0611   RDW 17.5 (H) 05/14/2021 0611   LYMPHSABS 2.4 05/06/2021 1600   MONOABS 1.7 (H) 05/06/2021 1600   EOSABS 0.1 05/06/2021 1600   BASOSABS 0.1 05/06/2021 1600     [facility]: [facility]   4.5h   500/1.5   145kg  2/2 bath P2 TDC  Hep 8000  - hect 5ug tiw  - no esa   Assessment/Plan: Sepsis/ L foot osteo - w/ ^WBC, hypotension. 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  WBC improving down to 9.7.   MSSA bacteremia  2/2 L foot osteo -  Antibiotics/recs per ID Discussed with Dr. [name], per ID will need a line holiday.   [place] removed 05/11/21 and to have new HD catheter placed 05/13/21 by IR. 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  ESRD - on HD TTS. Continue on schedule.  Plans to go to sister's house in [place] after discharge --will need transient placement at [facility]-accepted for TTS chair, will see if his abx are arranged and he has a chair for tomorrow that way we can get him set up for d/c today HD access - on HD 3 yrs. 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 Supposed to see [facility] soon about next access. Using [facility] now.  HTN - BP's soft likely due to infection. DM2 - on insulin. 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 Cont sensipar and [place].  Anemia ckd - Hb >10, no esa at OP unit. Follow. avoiding Fe for now  Gean Quint, MD [facility]

## 2021-05-14 NOTE — Discharge Summary (Signed)
[facility]  Physician Discharge Summary   Patient ID: Paul [name] MRN: [id number] DOB/AGE: 19-Jun-1990 30 y.o.  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 Sharol Given for his left foot Appointment made with infectious disease as well for bacteremia   Home Health: None Equipment/Devices: None  CODE STATUS: Full code  DISCHARGE CONDITION: fair  Diet recommendation: Modified carbohydrate  INITIAL HISTORY: Paul [name], 30 y.o. male with PMH of ESRD and HD TTS, T2DM, morbid obesity bmi 43, HTN, HLD, anemia of chronic disease, hypothyroidism presented with wound on the left great toe along with redness, pain initially started as a blister and ulcer on the bottom of the left great toe few weeks ago and got worse in last 2 to 3 days. He was seen in the ED systolic blood pressure insulin 80s given IV fluid boluses CT scan of the fluid no periosteal reaction MRI recommended, also had leukocytosis lactic acidosis abnormal LFTs and concern about severe sepsis PCCM was consulted and after discussion transferred under hospitalist for admission for severe sepsis. Patient blood culture with MSSA bacteremia seen by ID, underwent MRI of the left foot that showed osteomyelitis, 2 cm abscess-underwent left first ray amputation 12/17  Consultations: Orthopedics, Dr. Sharol Given Infectious disease Nephrology  Procedures: Hemodialysis  Ray amputation left foot  Removal of dialysis  catheter  Reinsertion of dialysis catheter  TEE 12/21  1. There is a large, echodensity 22 mm X 81m in the SVC, with a long  filamentous stalk. The stalk is adjacent to the tricuspid valve at the tip  but appears to have a connection to the SVC lesion.   2. Left ventricular ejection fraction, by estimation, is 65 to 70%. The  left ventricle has normal function.   3. Right ventricular systolic function is normal. The right ventricular  size is normal.   4. No left atrial/left atrial appendage thrombus was detected.   5. The mitral valve is normal in structure. No evidence of mitral valve  regurgitation.   6. The aortic valve is tricuspid. Aortic valve regurgitation is not  visualized. No aortic stenosis is present.     HOSPITAL COURSE:   MSSA bacteremia with severe sepsis Osteomyelitis of first proximal and distal phalanges, hallux sesamoid Polymicrobial wound infection  MRI was abnormal (see report) s/p left first ray amputation 12/17 ( Dr DSharol Given. Culture polymicrobial. Blood culture MSSA. Repeat blood culture negative ID recommended line holiday.  Dialysis catheter was removed on 12/20. 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 Infectious diseases recommended 6 weeks of cefazolin, through 06/19/2021.  This can be given with dialysis. Augmentin being given for 5 days.   SVC thrombus Noted on TEE.  Likely related to his dialysis catheter. Started on IV heparin.  To be transitioned to apixaban today.   Likely needs anticoagulation for at least 3 months.    T2DM on insulin with CKD and diabetic neuropathy Stable.  HbA1c 6.0. Started on gabapentin for neuropathy.   Hypotension Noted to be on midodrine.  Blood pressure is stable.   Hyperlipidemia Continue statin.  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  Dialysis catheter removed 12/20 for line holiday.  A new dialysis catheter was placed on 12/22 following which he successfully  underwent dialysis.  Nephrology trying to get him into the slot for tomorrow, 12/24.  If that is possible then patient can be discharged today.   Anemia of chronic disease Stable   Hypothyroidism Continue levothyroxine.   Class III obesity Estimated body mass index is 40.2 kg/m as calculated from the following:   Height as of this encounter: _0  (1.854 m).   Weight as of this encounter: 138.2 kg.     Patient is stable.  Okay for discharge once dialysis and antibiotics with dialysis have been arranged for 12/24.  If not, patient will stay till 12/24 and then discharged home after dialysis in the hospital.   PERTINENT LABS:  The results of significant diagnostics from this hospitalization (including imaging, microbiology, ancillary and laboratory) are listed below for reference.    Microbiology: Recent Results (from the past 240 hour(s))  Resp Panel by RT-PCR (Flu A&B, Covid) Nasopharyngeal Swab     Status: None   Collection Time: 05/06/21  3:37 PM   Specimen: Nasopharyngeal Swab; Nasopharyngeal(NP) swabs in vial transport medium  Result Value Ref Range Status   SARS Coronavirus 2 by RT PCR NEGATIVE NEGATIVE Final    Comment: (NOTE) SARS-CoV-2 target nucleic acids are NOT DETECTED.  The SARS-CoV-2 RNA is generally detectable in upper respiratory specimens during the acute phase of infection. The lowest concentration of SARS-CoV-2 viral copies this assay can detect is 138 copies/mL. A negative result does not preclude SARS-Cov-2 infection and should not be used as the sole basis for treatment or other patient management decisions. A negative result may occur with  improper specimen collection/handling, submission of specimen other than nasopharyngeal swab, presence of viral mutation(s) within the areas targeted by this assay, and inadequate number of viral copies(<138 copies/mL). A negative result must be combined with clinical observations, patient history, and  epidemiological information. The expected result is Negative.  Fact Sheet for Patients:  EntrepreneurPulse.com.au  Fact Sheet for Healthcare Providers:  IncredibleEmployment.be  This test is no t yet approved or cleared by the [place] FDA and  has been authorized for detection and/or diagnosis of SARS-CoV-2 by FDA under an Emergency Use Authorization (EUA). This EUA will remain  in effect (meaning this test can be used) for the duration of the COVID-19 declaration under Section 564(b)(1) of the Act, 21 U.S.C.section 360bbb-3(b)(1), unless the authorization is terminated  or revoked sooner.       Influenza A by PCR NEGATIVE NEGATIVE Final   Influenza B by PCR NEGATIVE NEGATIVE Final    Comment: (NOTE) The Xpert Xpress SARS-CoV-2/FLU/RSV plus assay is intended as an aid in the diagnosis of influenza from Nasopharyngeal swab specimens and should not be used as a sole basis for treatment. Nasal washings and aspirates are unacceptable for Xpert Xpress SARS-CoV-2/FLU/RSV testing.  Fact Sheet for Patients: EntrepreneurPulse.com.au  Fact Sheet for Healthcare Providers: IncredibleEmployment.be  This test is not yet approved or cleared by the [place] FDA and has been authorized for detection and/or diagnosis of SARS-CoV-2 by FDA under an Emergency Use Authorization (EUA). This EUA will remain in effect (meaning this test can be used) for the duration of the COVID-19 declaration under Section 564(b)(1) of the Act, 21 U.S.C. section 360bbb-3(b)(1), unless the authorization is terminated or revoked.  Performed at [facility], [address] [place], [place] [address]   Culture, blood (x 2)     Status: Abnormal   Collection Time: 05/06/21  4:00 PM   Specimen: BLOOD  Result Value Ref Range Status   Specimen Description   Final    BLOOD BLOOD RIGHT HAND Performed at [facility],  [address] [place], [place] [address]    Special Requests   Final    BOTTLES DRAWN AEROBIC ONLY Blood Culture results may not be optimal due to an inadequate volume of blood received in culture bottles Performed at [facility], [address] [place], [place] [address]    Culture  Setup Time   Final    GRAM POSITIVE COCCI AEROBIC BOTTLE ONLY CRITICAL RESULT CALLED TO, READ BACK BY AND VERIFIED WITH: PHARMD GREG ABBOTT 05/08/21_0 :17 BY TW    Culture (A)  Final    STAPHYLOCOCCUS AUREUS SUSCEPTIBILITIES PERFORMED ON PREVIOUS CULTURE WITHIN THE LAST 5 DAYS. Performed at [facility], [place] [address], [place], [place] [address]    Report Status 05/09/2021 FINAL  Final  Blood Culture ID Panel (Reflexed)     Status: Abnormal   Collection Time: 05/06/21  4:00 PM  Result Value Ref Range Status   Enterococcus faecalis NOT DETECTED NOT DETECTED Final   Enterococcus Faecium NOT DETECTED NOT DETECTED Final   Listeria monocytogenes NOT DETECTED NOT DETECTED Final   Staphylococcus species DETECTED (A) NOT DETECTED Final    Comment: CRITICAL RESULT CALLED TO, READ BACK BY AND VERIFIED WITH: PHARMD GREG ABBOTT 05/08/21_1 :17 BY TW    Staphylococcus aureus (BCID) DETECTED (A) NOT DETECTED Final    Comment: CRITICAL RESULT CALLED TO, READ BACK BY AND VERIFIED WITH: PHARMD GREG ABBOTT 05/08/21_2 :17 BY TW    Staphylococcus epidermidis NOT DETECTED NOT DETECTED Final   Staphylococcus lugdunensis NOT DETECTED NOT DETECTED Final   Streptococcus species NOT DETECTED NOT DETECTED Final   Streptococcus agalactiae NOT DETECTED NOT DETECTED Final   Streptococcus pneumoniae NOT DETECTED NOT DETECTED Final   Streptococcus pyogenes NOT DETECTED NOT DETECTED Final   A.calcoaceticus-baumannii NOT DETECTED NOT DETECTED Final   Bacteroides fragilis NOT DETECTED NOT DETECTED Final   Enterobacterales NOT DETECTED NOT DETECTED Final   Enterobacter cloacae complex NOT DETECTED NOT DETECTED  Final   Escherichia coli NOT DETECTED NOT DETECTED Final   Klebsiella aerogenes NOT DETECTED NOT DETECTED Final   Klebsiella oxytoca NOT DETECTED NOT DETECTED Final   Klebsiella pneumoniae NOT DETECTED NOT DETECTED Final   Proteus species NOT DETECTED NOT DETECTED Final   Salmonella species NOT DETECTED NOT DETECTED Final   Serratia marcescens NOT DETECTED NOT DETECTED Final   Haemophilus influenzae NOT DETECTED NOT DETECTED Final   Neisseria meningitidis NOT DETECTED NOT DETECTED Final   Pseudomonas aeruginosa NOT DETECTED NOT DETECTED Final   Stenotrophomonas maltophilia NOT DETECTED NOT DETECTED Final   Candida albicans NOT DETECTED NOT DETECTED Final   Candida auris NOT DETECTED NOT DETECTED Final   Candida glabrata NOT DETECTED NOT DETECTED Final   Candida krusei NOT DETECTED NOT DETECTED Final   Candida parapsilosis NOT DETECTED NOT DETECTED Final   Candida tropicalis NOT DETECTED NOT DETECTED Final   Cryptococcus neoformans/gattii NOT DETECTED NOT DETECTED Final   Meth resistant mecA/C and MREJ NOT DETECTED NOT DETECTED Final    Comment: Performed at [facility], [address], [place], [place] [address]  Culture, blood (x 2)     Status: Abnormal   Collection Time:  05/06/21  4:15 PM   Specimen: BLOOD  Result Value Ref Range Status   Specimen Description   Final    BLOOD BLOOD RIGHT HAND Performed at [facility], [address] [place], [place] [address]    Special Requests   Final    BOTTLES DRAWN AEROBIC AND ANAEROBIC Blood Culture adequate volume Performed at [facility], [address] [place], [place] [address]    Culture  Setup Time   Final    GRAM POSITIVE COCCI IN CLUSTERS IN BOTH AEROBIC AND ANAEROBIC BOTTLES CRITICAL RESULT CALLED TO, READ BACK BY AND VERIFIED WITH: PHARMD J.FRENS AT 5056 ON 05/07/2021 BY T.SAAD. Performed at [facility], [place] [address], [place], [place] [address]    Culture STAPHYLOCOCCUS AUREUS (A)   Final   Report Status 05/09/2021 FINAL  Final   Organism ID, Bacteria STAPHYLOCOCCUS AUREUS  Final      Susceptibility   Staphylococcus aureus - MIC*    CIPROFLOXACIN >=8 RESISTANT Resistant     ERYTHROMYCIN <=0.25 SENSITIVE Sensitive     GENTAMICIN <=0.5 SENSITIVE Sensitive     OXACILLIN <=0.25 SENSITIVE Sensitive     TETRACYCLINE <=1 SENSITIVE Sensitive     VANCOMYCIN 1 SENSITIVE Sensitive     TRIMETH/SULFA <=10 SENSITIVE Sensitive     CLINDAMYCIN <=0.25 SENSITIVE Sensitive     RIFAMPIN <=0.5 SENSITIVE Sensitive     Inducible Clindamycin NEGATIVE Sensitive     * STAPHYLOCOCCUS AUREUS  Blood Culture ID Panel (Reflexed)     Status: Abnormal   Collection Time: 05/06/21  4:15 PM  Result Value Ref Range Status   Enterococcus faecalis NOT DETECTED NOT DETECTED Final   Enterococcus Faecium NOT DETECTED NOT DETECTED Final   Listeria monocytogenes NOT DETECTED NOT DETECTED Final   Staphylococcus species DETECTED (A) NOT DETECTED Final    Comment: CRITICAL RESULT CALLED TO, READ BACK BY AND VERIFIED WITH: PHARMD J.FRENS AT 0165 ON 05/07/2021 BY T.SAAD.    Staphylococcus aureus (BCID) DETECTED (A) NOT DETECTED Final    Comment: CRITICAL RESULT CALLED TO, READ BACK BY AND VERIFIED WITH: PHARMD J.FRENS AT 5374 ON 05/07/2021 BY T.SAAD.    Staphylococcus epidermidis NOT DETECTED NOT DETECTED Final   Staphylococcus lugdunensis NOT DETECTED NOT DETECTED Final   Streptococcus species NOT DETECTED NOT DETECTED Final   Streptococcus agalactiae NOT DETECTED NOT DETECTED Final   Streptococcus pneumoniae NOT DETECTED NOT DETECTED Final   Streptococcus pyogenes NOT DETECTED NOT DETECTED Final   A.calcoaceticus-baumannii NOT DETECTED NOT DETECTED Final   Bacteroides fragilis NOT DETECTED NOT DETECTED Final   Enterobacterales NOT DETECTED NOT DETECTED Final   Enterobacter cloacae complex NOT DETECTED NOT DETECTED Final   Escherichia coli NOT DETECTED NOT DETECTED Final   Klebsiella aerogenes NOT  DETECTED NOT DETECTED Final   Klebsiella oxytoca NOT DETECTED NOT DETECTED Final   Klebsiella pneumoniae NOT DETECTED NOT DETECTED Final   Proteus species NOT DETECTED NOT DETECTED Final   Salmonella species NOT DETECTED NOT DETECTED Final   Serratia marcescens NOT DETECTED NOT DETECTED Final   Haemophilus influenzae NOT DETECTED NOT DETECTED Final   Neisseria meningitidis NOT DETECTED NOT DETECTED Final   Pseudomonas aeruginosa NOT DETECTED NOT DETECTED Final   Stenotrophomonas maltophilia NOT DETECTED NOT DETECTED Final   Candida albicans NOT DETECTED NOT DETECTED Final   Candida auris NOT DETECTED NOT DETECTED Final   Candida glabrata NOT DETECTED NOT DETECTED Final   Candida krusei NOT DETECTED NOT DETECTED Final  Candida parapsilosis NOT DETECTED NOT DETECTED Final   Candida tropicalis NOT DETECTED NOT DETECTED Final   Cryptococcus neoformans/gattii NOT DETECTED NOT DETECTED Final   Meth resistant mecA/C and MREJ NOT DETECTED NOT DETECTED Final    Comment: Performed at [facility], [address] [place], [place] [address]  Aerobic/Anaerobic Culture w Gram Stain (surgical/deep wound)     Status: None   Collection Time: 05/06/21  7:47 PM   Specimen: Toe; Wound  Result Value Ref Range Status   Specimen Description   Final    TOE Performed at [facility], [address] [place], [place] [address]    Special Requests   Final    Immunocompromised Performed at [facility] [address], [address] [place], [place] [address]    Gram Stain   Final    NO WBC SEEN MODERATE GRAM POSITIVE COCCI IN PAIRS    Culture   Final    ABUNDANT STAPHYLOCOCCUS AUREUS MODERATE ENTEROCOCCUS FAECALIS ABUNDANT BACTEROIDES FRAGILIS BETA LACTAMASE POSITIVE Performed at [facility], [place] [address], [place], [place] [address]    Report Status 05/10/2021 FINAL  Final   Organism ID, Bacteria STAPHYLOCOCCUS AUREUS  Final   Organism ID, Bacteria ENTEROCOCCUS FAECALIS   Final      Susceptibility   Enterococcus faecalis - MIC*    AMPICILLIN <=2 SENSITIVE Sensitive     VANCOMYCIN 2 SENSITIVE Sensitive     GENTAMICIN SYNERGY SENSITIVE Sensitive     * MODERATE ENTEROCOCCUS FAECALIS   Staphylococcus aureus - MIC*    CIPROFLOXACIN >=8 RESISTANT Resistant     ERYTHROMYCIN <=0.25 SENSITIVE Sensitive     GENTAMICIN <=0.5 SENSITIVE Sensitive     OXACILLIN <=0.25 SENSITIVE Sensitive     TETRACYCLINE <=1 SENSITIVE Sensitive     VANCOMYCIN <=0.5 SENSITIVE Sensitive     TRIMETH/SULFA <=10 SENSITIVE Sensitive     CLINDAMYCIN <=0.25 SENSITIVE Sensitive     RIFAMPIN <=0.5 SENSITIVE Sensitive     Inducible Clindamycin NEGATIVE Sensitive     * ABUNDANT STAPHYLOCOCCUS AUREUS  MRSA Next Gen by PCR, Nasal     Status: None   Collection Time: 05/08/21  2:17 AM   Specimen: Nasal Mucosa; Nasal Swab  Result Value Ref Range Status   MRSA by PCR Next Gen NOT DETECTED NOT DETECTED Final    Comment: (NOTE) The GeneXpert MRSA Assay (FDA approved for NASAL specimens only), is one component of a comprehensive MRSA colonization surveillance program. It is not intended to diagnose MRSA infection nor to guide or monitor treatment for MRSA infections. Test performance is not FDA approved in patients less than 63 years old. Performed at [facility], [place] [address] [place], [place] [address]   Culture, blood (routine x 2)     Status: None   Collection Time: 05/08/21  6:11 AM   Specimen: BLOOD RIGHT HAND  Result Value Ref Range Status   Specimen Description BLOOD RIGHT HAND  Final   Special Requests   Final    BOTTLES DRAWN AEROBIC AND ANAEROBIC Blood Culture results may not be optimal due to an inadequate volume of blood received in culture bottles   Culture   Final    NO GROWTH 5 DAYS Performed at [facility], [place] [address] [place], [place] [address]    Report Status 05/13/2021 FINAL  Final  Culture, blood (routine x 2)     Status: None   Collection Time:  05/08/21  6:26 AM   Specimen: BLOOD  Result Value Ref Range Status   Specimen Description BLOOD LEFT ANTECUBITAL  Final   Special Requests AEROBIC BOTTLE ONLY Blood Culture adequate volume  Final   Culture   Final    NO GROWTH 5 DAYS Performed at [facility] [address], [place], [place] [address]    Report Status 05/13/2021 FINAL  Final  Aerobic/Anaerobic Culture w Gram Stain (surgical/deep wound)     Status: None   Collection Time: 05/08/21  8:10 AM   Specimen: PATH Other; Tissue  Result Value Ref Range Status   Specimen Description TISSUE  Final   Special Requests TIS FROM GREAT LEFT TOE SPEC A  Final   Gram Stain   Final    FEW WBC PRESENT, PREDOMINANTLY PMN FEW GRAM POSITIVE COCCI    Culture   Final    MODERATE STAPHYLOCOCCUS AUREUS NO ANAEROBES ISOLATED Performed at [facility], [place] [address], [place], [place] [address]    Report Status 05/13/2021 FINAL  Final   Organism ID, Bacteria STAPHYLOCOCCUS AUREUS  Final      Susceptibility   Staphylococcus aureus - MIC*    CIPROFLOXACIN >=8 RESISTANT Resistant     ERYTHROMYCIN <=0.25 SENSITIVE Sensitive     GENTAMICIN <=0.5 SENSITIVE Sensitive     OXACILLIN <=0.25 SENSITIVE Sensitive     TETRACYCLINE <=1 SENSITIVE Sensitive     VANCOMYCIN 1 SENSITIVE Sensitive     TRIMETH/SULFA <=10 SENSITIVE Sensitive     CLINDAMYCIN <=0.25 SENSITIVE Sensitive     RIFAMPIN <=0.5 SENSITIVE Sensitive     Inducible Clindamycin NEGATIVE Sensitive     * MODERATE STAPHYLOCOCCUS AUREUS     Labs:  COVID-19 Labs   Lab Results  Component Value Date   SARSCOV2NAA NEGATIVE 05/06/2021   [place] NEGATIVE 07/25/2020   [place] NEGATIVE 07/02/2020   [place] NEGATIVE 06/15/2020      Basic Metabolic Panel: Recent Labs  Lab 05/08/21 0621 05/11/21 0824 05/12/21 0346 05/14/21 0611  NA 134* 133* 136 134*  K 3.7 3.9 3.7 4.3  CL 93* 97* 97* 99  CO2 _0 21*  GLUCOSE 109* 199* 111* 114*  BUN 47* 63* 46* 45*   CREATININE 16.36* 16.25* 12.87* 12.74*  CALCIUM 8.8* 8.9 9.7 9.3  PHOS  --  4.0  --  4.9*   Liver Function Tests: Recent Labs  Lab 05/11/21 0824 05/14/21 0611  ALBUMIN 2.9* 3.3*   CBC: Recent Labs  Lab 05/09/21 0232 05/10/21 0331 05/11/21 0824 05/12/21 0346 05/14/21 0611  WBC 12.6* 9.7 10.0 11.1* 11.5*  HGB 11.4* 12.0* 11.3* 13.8 11.9*  HCT 35.9* 37.0* 35.0* 40.1 36.6*  MCV 84.3 84.3 83.7 83.7 83.4  PLT 230 299 362 347 346     CBG: Recent Labs  Lab 05/13/21 0623 05/13/21 1119 05/13/21 1615 05/13/21 2316 05/14/21 0628  GLUCAP 175* 137* 134* 153* 121*     IMAGING STUDIES CT Foot Left Wo Contrast  Result Date: 05/06/2021 CLINICAL DATA:  Foot wound infection.  Osteomyelitis suspected EXAM: CT OF THE LEFT FOOT WITHOUT CONTRAST TECHNIQUE: Multidetector CT imaging of the left foot was performed according to the standard protocol. Multiplanar CT image reconstructions were also generated. COMPARISON:  None. FINDINGS: Bones/Joint/Cartilage No cortical erosion or periosteal reaction to suggest acute osteomyelitis. No evidence of fracture or dislocation Ligaments Suboptimally assessed by CT. Muscles and Tendons Muscles are normal in bulk and density. No intramuscular fluid collection. Soft tissues Skin thickening and marked subcutaneous soft  tissue edema about the first digit. There is deep skin wound on the plantar aspect of the first digit about the interphalangeal joint. No drainable fluid collection or abscess on this noncontrast enhanced examination. IMPRESSION: 1. Deep skin wound about the plantar aspect of the interphalangeal joint of the first digit. Skin thickening and subcutaneous soft tissue edema consistent with cellulitis. No cortical erosion or periosteal reaction suggesting osteomyelitis, however early osteomyelitis can not be excluded on CT examination. Further evaluation with MRI examination is recommended. 2. No drainable fluid collection or abscess on this  noncontrast enhanced examination. Electronically Signed   By: Keane Police D.O.   On: 05/06/2021 18:11   MR FOOT LEFT WO CONTRAST  Result Date: 05/07/2021 CLINICAL DATA:  Left great toe ulcer.  Left foot pain and redness. EXAM: MRI OF THE LEFT FOOT WITHOUT CONTRAST TECHNIQUE: Multiplanar, multisequence MR imaging of the left forefoot was performed. No intravenous contrast was administered. COMPARISON:  CT left foot from yesterday. FINDINGS: Bones/Joint/Cartilage Fairly confluent marrow edema involving the first proximal and distal phalanges, as well as the hallux sesamoids, with patchy T1 hypointensity, consistent with osteomyelitis. No fracture or dislocation. Joint spaces are preserved. No significant joint effusion. Ligaments Collateral ligaments are intact. Muscles and Tendons Partially torn distal flexor hallucis longus tendon with small amount of fluid surrounding the tendon. Remaining flexor and extensor tendons are intact. Increased T2 signal within the intrinsic muscles of the forefoot, nonspecific, but likely related to diabetic muscle changes. Soft tissue Soft tissue ulceration at the plantar base of the first IP joint. 2.0 x 0.7 x 1.3 cm fluid collection along the lateral aspect of the first proximal phalanx (series 7, image 22). IMPRESSION: 1. Soft tissue ulceration at the plantar base of the first IP joint with underlying osteomyelitis of the first proximal and distal phalanges, as well as the hallux sesamoids. 2. 2.0 cm abscess along the lateral aspect of the first proximal phalanx. 3. Partially torn distal flexor hallucis longus tendon with mild infectious tenosynovitis. Electronically Signed   By: Titus Dubin M.D.   On: 05/07/2021 10:45   IR Removal Tun Cv Cath W/O FL  Result Date: 05/11/2021 INDICATION: Patient with a history of end-stage renal disease with right IJ tunneled dialysis catheter placed in IR March 26, 2021. Patient now admitted with bacteremia. Interventional radiology  asked to remove tunneled dialysis catheter for line holiday. Patient with plans for new tunneled dialysis catheter placement 05/13/2021. EXAM: REMOVAL TUNNELED CENTRAL VENOUS CATHETER MEDICATIONS: 1% lidocaine 5 mL ANESTHESIA/SEDATION: None FLUOROSCOPY TIME:  None COMPLICATIONS: None immediate. PROCEDURE: Informed written consent was obtained from the patient after a thorough discussion of the procedural risks, benefits and alternatives. All questions were addressed. Maximal Sterile Barrier Technique was utilized including caps, mask, sterile gowns, sterile gloves, sterile drape, hand hygiene and skin antiseptic. A timeout was performed prior to the initiation of the procedure. The patient's right chest and catheter was prepped and draped in a normal sterile fashion. Heparin was removed from both ports of catheter. 1% lidocaine was used for local anesthesia. Using gentle blunt dissection and moderate manual traction the cuff of the catheter was exposed and the catheter was removed in it's entirety. Pressure was held till hemostasis was obtained. A sterile dressing was applied. The patient tolerated the procedure well with no immediate complications. IMPRESSION: Successful catheter removal as described above. Read by: Soyla Dryer, NP Electronically Signed   By: Sandi Mariscal M.D.   On: 05/11/2021 16:40   IR US Guide  Vasc Access Right  Result Date: 05/13/2021 INDICATION: 29 year old male with a history of end-stage renal disease on hemodialysis via a right upper extremity hemodialysis catheter. The catheter was recently removed for systemic bacteremia in the setting of a foot infection. He now presents for catheter replacement. EXAM: TUNNELED CENTRAL VENOUS HEMODIALYSIS CATHETER PLACEMENT WITH ULTRASOUND AND FLUOROSCOPIC GUIDANCE MEDICATIONS: 2 g Ancef. The antibiotic was given in an appropriate time interval prior to skin puncture. ANESTHESIA/SEDATION: Moderate (conscious) sedation was employed during this  procedure. A total of Versed 2 mg and Fentanyl 100 mcg was administered intravenously. Moderate Sedation Time: 32 minutes. The patient's level of consciousness and vital signs were monitored continuously by radiology nursing throughout the procedure under my direct supervision. FLUOROSCOPY TIME:  Fluoroscopy Time: 7 minutes 54 seconds (233 mGy). COMPLICATIONS: None immediate. PROCEDURE: Informed written consent was obtained from the patient after a discussion of the risks, benefits, and alternatives to treatment. Questions regarding the procedure were encouraged and answered. The right neck and chest were prepped with chlorhexidine in a sterile fashion, and a sterile drape was applied covering the operative field. Maximum barrier sterile technique with sterile gowns and gloves were used for the procedure. A timeout was performed prior to the initiation of the procedure. After creating a small venotomy incision, a micropuncture kit was utilized to access the right internal jugular vein under direct, real-time ultrasound guidance after the overlying soft tissues were anesthetized with 1% lidocaine with epinephrine. Ultrasound image documentation was performed for the permanent electronic medical record demonstrating patency of the IJ in the upper neck. Unfortunately, the microwire could not be advanced easily into the right heart but capped coiling upon itself in the region of the subclavian vein. Therefore, a 4 [place] transitional micro sheath was advanced over the wire. Venotomy was then performed. The right internal jugular vein is essentially occluded. There are fine thread like collaterals ultimately filling into the right innominate vein. A stiff glidewire was advanced through the 4 [place] transitional sheath an after significant manipulation was ultimately forced through the occlusion and into the right heart. The wire was successfully advanced through the right heart and into the inferior vena cava confirming  venous access. A 4 French glide catheter was then advanced over the wire and into the right heart. The stiff Glidewire was exchanged for a Lunderquist wire. The soft tissue tract and occluded portion of the jugular vein was then serially dilated and ultimately the 14.5 French peel-away sheath successfully advanced into the right heart. A 23 cm palindrome hemodialysis catheter was then advanced through the peel-away sheath and into the right heart with tips ultimately positioned within the superior aspect of the right atrium. Final catheter positioning was confirmed and documented with a spot radiographic image. The catheter aspirates and flushes normally. The catheter was flushed with appropriate volume heparin dwells. The catheter exit site was secured with a 0-Prolene retention suture. The venotomy incision was closed with an interrupted 4-0 Vicryl, and Dermabond. Dressings were applied. The patient tolerated the procedure well without immediate post procedural complication. IMPRESSION: Successful placement of 23 cm tip to cuff tunneled hemodialysis catheter via the right internal jugular vein with tips terminating within the superior aspect of the right atrium. The catheter is ready for immediate use. Of note, the right internal jugular vein was essentially occluded at the beginning of the case. The occluded segment was successfully crossed with a stiff hydrophilic wire and serially dilated to allow placement of the hemodialysis catheter. This catheter should be  considered a precious venous access. Removal in the future would likely preclude using the right IJ again as a vascular access. Electronically Signed   By: Jacqulynn Cadet M.D.   On: 05/13/2021 17:27   DG Chest Port 1 View  Result Date: 05/06/2021 CLINICAL DATA:  Possible septicemia EXAM: PORTABLE CHEST 1 VIEW COMPARISON:  Previous studies including the examination 1031 2019 FINDINGS: Transverse diameter of heart is in the upper limits of normal.  Central pulmonary vessels are prominent. There are no signs of alveolar pulmonary edema or focal pulmonary consolidation. Right hemidiaphragm is elevated. There is crowding of markings in the right lower lung fields. There is no pleural effusion or pneumothorax. Tip dialysis catheter is seen at the junction of superior vena cava and right atrium. IMPRESSION: There are no signs of pulmonary edema or focal pulmonary consolidation. Electronically Signed   By: Elmer Picker M.D.   On: 05/06/2021 16:38   VAS [place] ABI WITH/WO TBI  Result Date: 05/08/2021  LOWER EXTREMITY DOPPLER STUDY Patient Name:  Paul [name]  Date of Exam:   05/07/2021 Medical Rec #: [id number]      Accession #:    [id number] Date of Birth: 1991/05/05       Patient Gender: M Patient Age:   13 years Exam Location:  [facility] Procedure:      VAS [place] ABI WITH/WO TBI Referring Phys: Timoteo DUDA --------------------------------------------------------------------------------  Indications: Left great toe ulceration. High Risk Factors: Hyperlipidemia, Diabetes. Other Factors: ESRD on dialysis.  Limitations: Today's exam was limited due to Bandaging, history of multiple UE              dialysis accesses bilaterally. Comparison Study: No prior studies. Performing Technologist: Darlin Coco RDMS RVT  Examination Guidelines: A complete evaluation includes at minimum, Doppler waveform signals and systolic blood pressure reading at the level of bilateral brachial, anterior tibial, and posterior tibial arteries, when vessel segments are accessible. Bilateral testing is considered an integral part of a complete examination. Photoelectric Plethysmograph (PPG) waveforms and toe systolic pressure readings are included as required and additional duplex testing as needed. Limited examinations for reoccurring indications may be performed as noted.  ABI Findings: +---------+------------------+-----+---------+---------------------------------+  Right     Rt  Pressure (mmHg) Index Waveform  Comment                            +---------+------------------+-----+---------+---------------------------------+  Brachial                           triphasic Brachial waveform only, unable to                                                obtain pressure due to hx of                                                     dialysis access                    +---------+------------------+-----+---------+---------------------------------+  PTA       128  1.28  triphasic                                    +---------+------------------+-----+---------+---------------------------------+  DP        102                1.02  triphasic                                    +---------+------------------+-----+---------+---------------------------------+  Great Toe 70                 0.70  Normal                                       +---------+------------------+-----+---------+---------------------------------+ +---------+------------------+-----+---------+---------------------------------+  Left      Lt Pressure (mmHg) Index Waveform  Comment                            +---------+------------------+-----+---------+---------------------------------+  Brachial  100                      triphasic Brachial waveform, radial/forearm                                                pressure due to hx of dialysis                                                   access                             +---------+------------------+-----+---------+---------------------------------+  PTA       126                1.26  triphasic                                    +---------+------------------+-----+---------+---------------------------------+  DP        114                1.14  triphasic                                    +---------+------------------+-----+---------+---------------------------------+  Great Toe                                    Unable to obtain due to                                                           wound/bandaging                    +---------+------------------+-----+---------+---------------------------------+  Summary: Right: Resting right ankle-brachial index is within normal range. No evidence of significant right lower extremity arterial disease. The right toe-brachial index is normal. Left: Resting left ankle-brachial index is within normal range. No evidence of significant left lower extremity arterial disease.  *See table(s) above for measurements and observations.  Electronically signed by Orlie Pollen on 05/08/2021 at 10:25:27 AM.    Final    ECHOCARDIOGRAM COMPLETE  Result Date: 05/09/2021    ECHOCARDIOGRAM REPORT   Patient Name:   Paul [name] Date of Exam: 05/09/2021 Medical Rec #:  [id number]     Height:       73.0 in Accession #:    [id number]    Weight:       319.0 lb Date of Birth:  26-Sep-1990      BSA:          2.624 m Patient Age:    30 years      BP:           105/55 mmHg Patient Gender: M             HR:           97 bpm. Exam Location:  Inpatient Procedure: 2D Echo, Color Doppler and Cardiac Doppler Indications:    Bacteremia  History:        Patient has no prior history of Echocardiogram examinations.                 Risk Factors:Diabetes and Hypertension.  Sonographer:    Jyl Heinz Referring Phys: [id number] [place]  1. Left ventricular ejection fraction, by estimation, is 60 to 65%. The left ventricle has normal function. The left ventricle has no regional wall motion abnormalities. Left ventricular diastolic parameters were normal.  2. Right ventricular systolic function is normal. The right ventricular size is normal.  3. The mitral valve is normal in structure. No evidence of mitral valve regurgitation. No evidence of mitral stenosis.  4. The aortic valve is normal in structure. Aortic valve regurgitation is not visualized. No aortic stenosis is present.  5. The inferior vena cava is normal in size with greater than 50%  respiratory variability, suggesting right atrial pressure of 3 mmHg. Conclusion(s)/Recommendation(s): No evidence of valvular vegetations on this transthoracic echocardiogram. Consider a transesophageal echocardiogram to exclude infective endocarditis if clinically indicated. FINDINGS  Left Ventricle: Left ventricular ejection fraction, by estimation, is 60 to 65%. The left ventricle has normal function. The left ventricle has no regional wall motion abnormalities. The left ventricular internal cavity size was normal in size. There is  no left ventricular hypertrophy. Left ventricular diastolic parameters were normal. Right Ventricle: The right ventricular size is normal. No increase in right ventricular wall thickness. Right ventricular systolic function is normal. Left Atrium: Left atrial size was normal in size. Right Atrium: Right atrial size was normal in size. Pericardium: There is no evidence of pericardial effusion. Mitral Valve: The mitral valve is normal in structure. No evidence of mitral valve regurgitation. No evidence of mitral valve stenosis. Tricuspid Valve: The tricuspid valve is normal in structure. Tricuspid valve regurgitation is not demonstrated. No evidence of tricuspid stenosis. Aortic Valve: The aortic valve is normal in structure. Aortic valve regurgitation is not visualized. No aortic stenosis is present. Aortic valve peak gradient measures 7.2 mmHg. Pulmonic Valve: The pulmonic valve was normal in structure. Pulmonic valve regurgitation is not visualized. No evidence of pulmonic stenosis. Aorta: The aortic root is normal in size and structure.  Venous: The inferior vena cava is normal in size with greater than 50% respiratory variability, suggesting right atrial pressure of 3 mmHg. IAS/Shunts: No atrial level shunt detected by color flow Doppler.  LEFT VENTRICLE PLAX 2D LVIDd:         4.10 cm      Diastology LVIDs:         2.90 cm      LV e' medial:    7.94 cm/s LV PW:         1.20 cm       LV E/e' medial:  11.0 LV IVS:        1.10 cm      LV e' lateral:   10.60 cm/s LVOT diam:     2.20 cm      LV E/e' lateral: 8.2 LV SV:         78 LV SV Index:   30 LVOT Area:     3.80 cm  LV Volumes (MOD) LV vol d, MOD A2C: 127.0 ml LV vol d, MOD A4C: 140.0 ml LV vol s, MOD A2C: 47.8 ml LV vol s, MOD A4C: 52.4 ml LV SV MOD A2C:     79.2 ml LV SV MOD A4C:     140.0 ml LV SV MOD BP:      84.8 ml RIGHT VENTRICLE             IVC RV Basal diam:  3.20 cm     IVC diam: 1.20 cm RV Mid diam:    2.00 cm RV S prime:     15.50 cm/s TAPSE (M-mode): 2.0 cm LEFT ATRIUM             Index        RIGHT ATRIUM           Index LA diam:        3.60 cm 1.37 cm/m   RA Area:     13.80 cm LA Vol (A2C):   40.6 ml 15.47 ml/m  RA Volume:   34.10 ml  13.00 ml/m LA Vol (A4C):   38.0 ml 14.48 ml/m LA Biplane Vol: 39.6 ml 15.09 ml/m  AORTIC VALVE AV Area (Vmax): 3.18 cm AV Vmax:        134.00 cm/s AV Peak Grad:   7.2 mmHg LVOT Vmax:      112.00 cm/s LVOT Vmean:     87.500 cm/s LVOT VTI:       0.205 m  AORTA Ao Root diam: 3.20 cm Ao Asc diam:  3.60 cm MITRAL VALVE MV Area (PHT): 4.89 cm    SHUNTS MV Decel Time: 155 msec    Systemic VTI:  0.20 m MV E velocity: 87.00 cm/s  Systemic Diam: 2.20 cm MV A velocity: 64.00 cm/s MV E/A ratio:  1.36 Mihai Croitoru MD Electronically signed by Sanda Klein MD Signature Date/Time: 05/09/2021/2:08:43 PM    Final    ECHO TEE  Result Date: 05/12/2021    TRANSESOPHOGEAL ECHO REPORT   Patient Name:   Paul [name] Date of Exam: 05/12/2021 Medical Rec #:  [id number]     Height:       73.0 in Accession #:    [id number]    Weight:       304.7 lb Date of Birth:  03/25/1991      BSA:          2.573 m Patient Age:    30 years      BP:  118/89 mmHg Patient Gender: M             HR:           98 bpm. Exam Location:  Inpatient Procedure: Transesophageal Echo, 3D Echo, Color Doppler and Cardiac Doppler Indications:     Bacteremia  History:         Patient has prior history of Echocardiogram examinations,  most                  recent 05/09/2021. Risk Factors:Hypertension, Dyslipidemia and                  Diabetes.  Sonographer:     Bernadene Person RDCS Referring Phys:  1993 RHONDA G BARRETT Diagnosing Phys: Rudean Haskell MD PROCEDURE: After discussion of the risks and benefits of a TEE, an informed consent was obtained from the patient. The transesophogeal probe was passed without difficulty through the esophogus of the patient. Local oropharyngeal anesthetic was provided with Cetacaine. Sedation performed by different physician. The patient was monitored while under deep sedation. Anesthestetic sedation was provided intravenously by Anesthesiology: 617.32m of Propofol, 6103mof Lidocaine. The patient developed no complications during the procedure. IMPRESSIONS  1. 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 The stalk is adjacent to the tricuspid valve at the tip but appears to have a connection to the SVC lesion.  2. Left ventricular ejection fraction, by estimation, is 65 to 70%. The left ventricle has normal function.  3. Right ventricular systolic function is normal. The right ventricular size is normal.  4. No left atrial/left atrial appendage thrombus was detected.  5. The mitral valve is normal in structure. No evidence of mitral valve regurgitation.  6. The aortic valve is tricuspid. Aortic valve regurgitation is not visualized. No aortic stenosis is present. Conclusion(s)/Recommendation(s): In the setting of bacteremia and recent central line, cannot exclude septic thrombus arising from the SVC. Repeat imaging after therapy (TEE vs Cardiac CT) is reasonable. Reached out to primary and ID team post procedure. FINDINGS  Left Ventricle: Left ventricular ejection fraction, by estimation, is 65 to 70%. The left ventricle has normal function. The left ventricular internal cavity size was normal in size. Right Ventricle: The right ventricular size is normal. No  increase in right ventricular wall thickness. Right ventricular systolic function is normal. Left Atrium: Left atrial size was normal in size. No left atrial/left atrial appendage thrombus was detected. Right Atrium: Right atrial size was not well visualized. Pericardium: There is no evidence of pericardial effusion. Mitral Valve: The mitral valve is normal in structure. No evidence of mitral valve regurgitation. Tricuspid Valve: The tricuspid valve is abnormal. Tricuspid valve regurgitation is not demonstrated. Aortic Valve: The aortic valve is tricuspid. Aortic valve regurgitation is not visualized. No aortic stenosis is present. Pulmonic Valve: The pulmonic valve was normal in structure. Pulmonic valve regurgitation is not visualized. Aorta: The aortic root and ascending aorta are structurally normal, with no evidence of dilitation. IAS/Shunts: The atrial septum is grossly normal.   3D Volume EF LV 3D EF:    69.60 % LV 3D EDV:   234100.00 mm LV 3D ESV:   71200.00 mm LV 3D SV:    163000.00 mm MahRudean Haskell Electronically signed by MahRudean Haskell Signature Date/Time: 05/12/2021/6:44:44 PM    Final     DISCHARGE EXAMINATION: Vitals:   05/13/21 2240 05/13/21 2300 05/14/21 0341 05/14/21 0751  BP: 138/76 110/76 (!)Marland Kitchen  125/101 (!) 81/16  Pulse: (!) 102 100 90 92  Resp: _0 Temp: 97.9 F (36.6 C) 98 F (36.7 C) 98.5 F (36.9 C)   TempSrc: Oral Oral Oral   SpO2: 99% 98% 100% 100%  Weight: (!) 148 kg     Height:       General appearance: Awake alert.  In no distress Resp: Clear to auscultation bilaterally.  Normal effort Cardio: S1-S2 is normal regular.  No S3-S4.  No rubs murmurs or bruit GI: Abdomen is soft.  Nontender nondistended.  Bowel sounds are present normal.  No masses organomegaly   DISPOSITION: Home  Discharge Instructions     Call MD for:  difficulty breathing, headache or visual disturbances   Complete by: As directed    Call MD for:  extreme fatigue    Complete by: As directed    Call MD for:  persistant dizziness or light-headedness   Complete by: As directed    Call MD for:  persistant nausea and vomiting   Complete by: As directed    Call MD for:  severe uncontrolled pain   Complete by: As directed    Call MD for:  temperature >100.4   Complete by: As directed    Diet - low sodium heart healthy   Complete by: As directed    Discharge instructions   Complete by: As directed    Please take your medications as prescribed.  Please seek attention immediately if you notice any bleeding from any site.  Seek attention if you develop shortness of breath or chest pain.  Seek attention if you get fever chills nausea vomiting.  Go for your usual dialysis as instructed.  Be sure to follow-up with Dr. Sharol Given for your left foot.  You were cared for by a hospitalist during your hospital stay. If you have any questions about your discharge medications or the care you received while you were in the hospital after you are discharged, you can call the unit and asked to speak with the hospitalist on call if the hospitalist that took care of you is not available. Once you are discharged, your primary care physician will handle any further medical issues. Please note that NO REFILLS for any discharge medications will be authorized once you are discharged, as it is imperative that you return to your primary care physician (or establish a relationship with a primary care physician if you do not have one) for your aftercare needs so that they can reassess your need for medications and monitor your lab values. If you do not have a primary care physician, you can call [phone] for a physician referral.   Increase activity slowly   Complete by: As directed    No wound care   Complete by: As directed    Touch down weight bearing   Complete by: As directed    Laterality: left   Extremity: Lower         Allergies as of 05/14/2021       Reactions   Hydralazine  Hcl Other (See Comments)   DRUG-INDUCED LUPUS        Medication List     STOP taking these medications    amLODipine 5 MG tablet Commonly known as: NORVASC       TAKE these medications    Accu-Chek FastClix Lancets Misc USE AS DIRECTED ONCE DAILY   Accu-Chek Guide Me w/Device Kit 1 kit by Does not apply route daily.   Accu-Chek  Guide test strip Generic drug: glucose blood 1 each by Other route 2 (two) times daily. Use as instructed   acetaminophen 500 MG tablet Commonly known as: TYLENOL Take 1,000 mg by mouth every 6 (six) hours as needed (for pain.).   amoxicillin-clavulanate 500-125 MG tablet Commonly known as: AUGMENTIN Take 1 tablet (500 mg total) by mouth at bedtime for 2 days.   Apixaban Starter Pack (29m and 5958m Commonly known as: ELIQUIS STARTER PACK Take as directed on package: start with two-58m74mablets twice daily for 7 days. On day 8, switch to one-58mg22mblet twice daily.   apixaban 5 MG Tabs tablet Commonly known as: ELIQUIS Take 1 tablet (5 mg total) by mouth 2 (two) times daily. START TAKING ONLY AFTER STARTER PACK HAS BEEN COMPLETED Start taking on: June 14, 2021   B-D SYRINGE SLIP TIP 30CC 30 ML Misc Generic drug: Syringe (Disposable) 1 Syringe by Does not apply route daily.   calcium carbonate 500 MG chewable tablet Commonly known as: TUMS - dosed in mg elemental calcium Chew 1,000-1,500 mg by mouth daily as needed for indigestion or heartburn.   ceFAZolin 2-4 GM/100ML-% IVPB Commonly known as: ANCEF Inject 100 mLs (2 g total) into the vein every Tuesday, Thursday, and Saturday at 6 PM. Start taking on: May 15, 2021   cinacalcet 60 MG tablet Commonly known as: SENSIPAR Take 60 mg by mouth every other day.   ferric citrate 1 GM 210 MG(Fe) tablet Commonly known as: AURYXIA Take 420-840 mg by mouth See admin instructions. Take 3-4 tablets (630 mg-840 mg) by mouth with each meal & take 1-2 tablets (210-420 mg) by mouth with  each snack.   gabapentin 100 MG capsule Commonly known as: NEURONTIN Take 1 capsule (100 mg total) by mouth 3 (three) times daily.   heparin 1000 unit/mL Soln injection Heparin Sodium (Porcine) 1,000 Units/mL Systemic   insulin glargine 100 UNIT/ML injection Commonly known as: Lantus Inject 0.08 mLs (8 Units total) into the skin daily. What changed: See the new instructions.   levothyroxine 75 MCG tablet Commonly known as: SYNTHROID TAKE 1 TABLET BY MOUTH ONCE DAILY BEFORE BREAKFAST   midodrine 5 MG tablet Commonly known as: PROAMATINE Take 1 tablet (5 mg total) by mouth 3 (three) times daily with meals.   multivitamin Tabs tablet Take 1 tablet by mouth at bedtime. What changed: when to take this   oxyCODONE-acetaminophen 7.5-325 MG tablet Commonly known as: PERCOCET Take 1 tablet by mouth every 6 (six) hours as needed for severe pain or moderate pain.   polyethylene glycol 17 g packet Commonly known as: MIRALAX / GLYCOLAX Take 17 g by mouth daily as needed.   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  A walker will not resolve  issue with performing activities of daily living. A wheelchair will allow patient to safely perform daily activities. Patient is not able to propel themselves in the home  using a standard weight wheelchair due to general weakness. Patient can self propel in the lightweight wheelchair. Length of need 12 months . Accessories: elevating leg rests (ELRs), wheel locks, extensions and anti-tippers.   05/14/21 1015               Discharge Care Instructions  (From admission, onward)           Start     Ordered   05/08/21 0000  Touch down weight bearing       Question Answer Comment  Laterality left   Extremity Lower      05/08/21 4174              Follow-up Information     Newt Minion, MD Follow up in 1 week(s).   Specialty: Orthopedic Surgery Contact information: [place] [address] [phone]         Carlyle Basques, MD Follow up.   Specialty: Infectious Diseases Why: 06/03/21 at 2pm. Please call to reschedule if you are not able to make this appointment. Contact information: [address]  [place] [address] [phone]                 TOTAL DISCHARGE TIME: 35 mins  [facility] Pager on [email or web address]  05/14/2021, 11:07 AM

## 2021-05-14 NOTE — Progress Notes (Signed)
Pt is for likely d/c today. Spoke to [place], Agricultural consultant, at [employer] this am. Clinic aware pt will start tomorrow and pt will need to arrive at 5:30 due to holiday. Pt's arrival time on Tuesday will be 6:00. Spoke to pt's sister via phone to advise her of pt's arrival time for tomorrow. Renal NP has provided orders to clinic and noted need for iv abx.   Melven Sartorius Renal Navigator [phone]

## 2021-05-14 NOTE — TOC Transition Note (Incomplete)
Transition of Care [facility]) - CM/SW Discharge Note   Patient Details  Name: Paul [name] MRN: [id number] Date of Birth: 02/15/1991  [facility]) CM/SW Contact:  Sharin Mons, RN Phone Number: 05/14/2021, 1:25 PM   Clinical Narrative:    Patient will DC to: Home Anticipated DC date: 05/14/2021 Family notified: yes Transport by: car   Per MD patient ready for DC today. RN, patient,  and patient's sister [name] notified of DC. Pt states will recover @ sister residence: [address] ,[place] [address]. Referral made with A  RNCM will sign off for now as intervention is no longer needed. Please consult [place] again if new needs arise.    Final next level of care: Home/Self Care Barriers to Discharge: Continued Medical Work up   Patient Goals and CMS Choice     Choice offered to / list presented to : Patient  Discharge Placement                       Discharge Plan and Services   Discharge Planning Services: CM Consult            DME Arranged: Wheelchair manual DME Agency: [employer] Date DME Agency Contacted: 05/14/21 Time DME Agency Contacted: [phone]              Social Determinants of Health (SDOH) Interventions     Readmission Risk Interventions No flowsheet data found.

## 2021-05-14 NOTE — Progress Notes (Signed)
ANTICOAGULATION CONSULT NOTE - Follow Up Consult  Pharmacy Consult for heparin Indication:  SVC thrombus  Labs: Recent Labs    05/11/21 0824 05/12/21 0346 05/12/21 2239 05/13/21 0736 05/14/21 0611  HGB 11.3* 13.8  --   --  11.9*  HCT 35.0* 40.1  --   --  36.6*  PLT 362 347  --   --  346  HEPARINUNFRC  --   --  0.24* 0.25* 0.40  CREATININE 16.25* 12.87*  --   --   --     Assessment/Plan:  30yo male therapeutic on heparin after resumed. Will continue infusion at current rate of 2200 units/hr and confirm stable with additional level.   Wynona Neat, PharmD, BCPS  05/14/2021,6:41 AM

## 2021-05-14 NOTE — Progress Notes (Signed)
ANTICOAGULATION CONSULT NOTE - Follow Up Consult  Pharmacy Consult for Heparin > Apixaban Indication:  SVC thrombus  Allergies  Allergen Reactions   Hydralazine Hcl Other (See Comments)    DRUG-INDUCED LUPUS    Patient Measurements: Height: 6\' 1"  (185.4 cm) Weight: (!) 148 kg (326 lb 4.5 oz) IBW/kg (Calculated) : 79.9 Heparin Dosing Weight: 111 kg  Vital Signs: Temp: 98.5 F (36.9 C) (12/23 0341) Temp Source: Oral (12/23 0341) BP: 81/16 (12/23 0751) Pulse Rate: 92 (12/23 0751)  Labs: Recent Labs    05/12/21 0346 05/12/21 2239 05/13/21 0736 05/14/21 0611  HGB 13.8  --   --  11.9*  HCT 40.1  --   --  36.6*  PLT 347  --   --  346  HEPARINUNFRC  --  0.24* 0.25* 0.40  CREATININE 12.87*  --   --  12.74*   ESRD  Assessment: 30 yo male with 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 R IJ TDC removed 12/20. IV heparin begun 12/21. Not on anticoagulant PTA   Heparin drip held for [facility] placement 12/22 and resumed in pm.  To transition to Apixaban today.  Goal of Therapy:  Heparin level 0.3-0.7 units/ml Appropriate Apixaban dose for indication Monitor platelets by anticoagulation protocol: Yes   Plan:  Apixaban 10 mg BID for 1 week, then 5 mg BID. Stop IV heparin when giving first Apixaban dose.  Arty Baumgartner, [place] 05/14/2021,9:54 AM

## 2021-05-14 NOTE — Plan of Care (Signed)

## 2021-05-15 ENCOUNTER — Telehealth: Payer: Self-pay

## 2021-05-15 NOTE — Telephone Encounter (Signed)
Transition Care Management Follow-up Telephone Call Date of discharge and from where: 05/14/2021[facility]  How have you been since you were released from the hospital? Patient stated that he is doing ok.  Any questions or concerns? Yes patient has questions about dressing his wound. Stated he was not given clear instructions on wrapping and asked if I could speak to his sister to assist. Assistance given.   Items Reviewed: Did the pt receive and understand the discharge instructions provided? Yes  Medications obtained and verified? Yes  Other? No  Any new allergies since your discharge? No  Dietary orders reviewed? No Do you have support at home? Yes   Home Care and Equipment/Supplies: Were home health services ordered? not applicable If so, what is the name of the agency? N/A  Has the agency set up a time to come to the patient's home? not applicable Were any new equipment or medical supplies ordered?  No What is the name of the medical supply agency? N/A Were you able to get the supplies/equipment? not applicable Do you have any questions related to the use of the equipment or supplies? No  Functional Questionnaire: (I = Independent and D = Dependent) ADLs: I  Bathing/Dressing- I  Meal Prep- I  Eating- I  Maintaining continence- I  Transferring/Ambulation- I  Managing Meds- I  Follow up appointments reviewed:  PCP Hospital f/u appt confirmed? No  confirmed? Yes  Scheduled to see Dr. Baxter Flattery on 06/03/2021 @ 2pm. Are transportation arrangements needed? No  If their condition worsens, is the pt aware to call PCP or go to the Emergency Dept.? Yes Was the patient provided with contact information for the PCP's office or ED? Yes Was to pt encouraged to call back with questions or concerns? Yes

## 2021-05-18 ENCOUNTER — Other Ambulatory Visit: Payer: Self-pay

## 2021-05-18 ENCOUNTER — Telehealth: Payer: Self-pay | Admitting: Orthopedic Surgery

## 2021-05-18 DIAGNOSIS — Z992 Dependence on renal dialysis: Secondary | ICD-10-CM

## 2021-05-18 DIAGNOSIS — A419 Sepsis, unspecified organism: Secondary | ICD-10-CM

## 2021-05-18 DIAGNOSIS — N186 End stage renal disease: Secondary | ICD-10-CM

## 2021-05-18 LAB — FACTOR 5 LEIDEN

## 2021-05-18 LAB — PROTHROMBIN GENE MUTATION

## 2021-05-18 NOTE — Telephone Encounter (Signed)
Pt called requesting a refill oxycodone. Please send pharmacy on file. Pt phone number is [phone].

## 2021-05-18 NOTE — Telephone Encounter (Signed)
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

## 2021-05-19 ENCOUNTER — Other Ambulatory Visit: Payer: Self-pay | Admitting: Student

## 2021-05-19 DIAGNOSIS — E039 Hypothyroidism, unspecified: Secondary | ICD-10-CM

## 2021-05-19 MED ORDER — OXYCODONE-ACETAMINOPHEN 7.5-325 MG PO TABS: 1.0000 | ORAL_TABLET | Freq: Four times a day (QID) | ORAL | 0 refills | Status: DC | PRN

## 2021-05-21 ENCOUNTER — Encounter: Payer: Medicaid Other | Admitting: Family

## 2021-05-21 ENCOUNTER — Other Ambulatory Visit: Payer: Self-pay

## 2021-05-21 ENCOUNTER — Telehealth: Payer: Self-pay | Admitting: Family

## 2021-05-21 NOTE — Patient Outreach (Signed)
Medicaid Managed Care   Nurse Care Manager Note  05/21/2021 Name:  Paul [name] MRN:  [id number] DOB:  1990/08/09  Amron Guerrette is an 30 y.o. year old male who is a primary patient of Orvis Brill, DO.  The [facility] Managed Care Coordination team was consulted for assistance with:    DMII  Great toe amputation  Mr. Sangalang was given information about Medicaid Managed Care Coordination team services today. [name] Patient agreed to services and verbal consent obtained.  Engaged with patient by telephone for initial visit in response to provider referral for case management and/or care coordination services.   Assessments/Interventions:  Review of past medical history, allergies, medications, health status, including review of consultants reports, laboratory and other test data, was performed as part of comprehensive evaluation and provision of chronic care management services.  SDOH (Social Determinants of Health) assessments and interventions performed: SDOH Interventions    Flowsheet Row Most Recent Value  SDOH Interventions   Food Insecurity Interventions Intervention Not Indicated  Housing Interventions Intervention Not Indicated  Transportation Interventions Other (Comment)  [Medical transportation provided by [facility]  Allergies  Allergen Reactions   Hydralazine Hcl Other (See Comments)    DRUG-INDUCED LUPUS    Medications Reviewed Today     Reviewed by Melissa Montane, RN (Registered Nurse) on 05/21/21 at 1434  Med List Status: <None>   Medication Order Taking? Sig Documenting Provider Last Dose Status Informant  Accu-Chek FastClix Lancets MISC [id number] No USE AS DIRECTED ONCE DAILY  Patient not taking: Reported on 05/21/2021   Wilber Oliphant, MD Not Taking Active Self           Med Note (Rachelann Enloe A   Fri May 21, 2021  2:25 PM) Lost supplies  acetaminophen (TYLENOL) 500 MG tablet [id number] Yes Take 1,000 mg by mouth every 6  (six) hours as needed (for pain.). [provider] Taking Active Self  apixaban (ELIQUIS) 5 MG TABS tablet [id number] No Take 1 tablet (5 mg total) by mouth 2 (two) times daily. START TAKING ONLY AFTER STARTER PACK HAS BEEN COMPLETED  Patient not taking: Reported on 05/21/2021   Bonnielee Haff, MD Not Taking Active   APIXABAN Arne Cleveland) VTE STARTER PACK (10MG AND 5MG) [id number] Yes Take as directed on package: start with two-58m tablets twice daily for 7 days. On day 8, switch to one-567mtablet twice daily. KrBonnielee HaffMD Taking Active   Blood Glucose Monitoring Suppl (ACCU-CHEK GUIDE ME) w/Device KIT [id number] 1 kit by Does not apply route daily.  Patient not taking: Reported on 05/21/2021   KiWilber OliphantMD Not Taking Active Self           Med Note (Bay Jarquin A   Fri May 21, 2021  2:26 PM) lost  calcium carbonate (TUMS - DOSED IN MG ELEMENTAL CALCIUM) 500 MG chewable tablet [id number] Chew 1,000-1,500 mg by mouth daily as needed for indigestion or heartburn. [provider] Taking Active Self  ceFAZolin (ANCEF) 2-4 GM/100ML-% IVPB [id number] Inject 100 mLs (2 g total) into the vein every Tuesday, Thursday, and Saturday at 6 PM. KrBonnielee HaffMD Taking Active   cinacalcet (SENSIPAR) 60 MG tablet [id number] Take 60 mg by mouth every other day. [provider] Taking Active Self           Med Note (AOrvan SeenHESharlette Dense Sat May 08, 2021  5:59 PM) Pt does not  remember date of last dose  ferric citrate (AURYXIA) 1 GM 210 MG(Fe) tablet [id number] Yes Take 420-840 mg by mouth See admin instructions. Take 3-4 tablets (630 mg-840 mg) by mouth with each meal & take 1-2 tablets (210-420 mg) by mouth with each snack. [provider] Taking Active Self  gabapentin (NEURONTIN) 100 MG capsule [id number] Yes Take 1 capsule (100 mg total) by mouth 3 (three) times daily. Bonnielee Haff, MD Taking Active   glucose blood (ACCU-CHEK GUIDE) test strip [id number]  No 1 each by Other route 2 (two) times daily. Use as instructed  Patient not taking: Reported on 05/21/2021   Wilber Oliphant, MD Not Taking Active Self           Med Note Thamas Jaegers, Vannie Hochstetler A   Fri May 21, 2021  2:28 PM) lost  heparin 1000 unit/mL SOLN injection [id number] Yes Heparin Sodium (Porcine) 1,000 Units/mL Systemic [provider] Taking Active Self           Med Note [name]   Fri Apr 30, 2021  8:27 AM) Administered at dialysis  insulin glargine (LANTUS) 100 UNIT/ML injection [id number] Yes Inject 0.08 mLs (8 Units total) into the skin daily. Bonnielee Haff, MD Taking Active            Med Note Thamas Jaegers, Lexani Corona A   Fri May 21, 2021  2:30 PM) Taking 8-10 units   levothyroxine (SYNTHROID) 75 MCG tablet [id number] Yes TAKE 1 TABLET BY MOUTH ONCE DAILY BEFORE BREAKFAST Dameron, Luna Fuse, DO Taking Active   midodrine (PROAMATINE) 5 MG tablet [id number] No Take 1 tablet (5 mg total) by mouth 3 (three) times daily with meals.  Patient not taking: Reported on 05/21/2021   Bonnielee Haff, MD Not Taking Active   multivitamin (RENA-VIT) TABS tablet [id number] Yes Take 1 tablet by mouth at bedtime.  Patient taking differently: Take 1 tablet by mouth every evening.   Nicolette Bang, MD Taking Active Self  oxyCODONE-acetaminophen (PERCOCET) 7.5-325 MG tablet [id number] Yes Take 1 tablet by mouth every 6 (six) hours as needed for severe pain or moderate pain. Suzan Slick, NP Taking Active   polyethylene glycol (MIRALAX / GLYCOLAX) 17 g packet [id number] No Take 17 g by mouth daily as needed.  Patient not taking: Reported on 05/21/2021   Bonnielee Haff, MD Not Taking Active            Med Note Thamas Jaegers, Matika Bartell A   Fri May 21, 2021  2:33 PM) Lana Fish at Pharmacy  rosuvastatin (CRESTOR) 20 MG tablet [id number] Yes Take 2 tablets by mouth once daily  Patient taking differently: Take 20 mg by mouth daily.   Orvis Brill, DO Taking Active Self  Syringe, Disposable, (B-D SYRINGE  SLIP TIP 30CC) 30 ML MISC [id number] Yes 1 Syringe by Does not apply route daily. Wilber Oliphant, MD Taking Active Self  Med List Note [name] 07/24/20 9892): Dialysis Tues, thurs and Saturday            Patient Active Problem List   Diagnosis Date Noted   Diabetic foot infection ([place])    Wound infection 05/07/2021   MSSA bacteremia 05/07/2021   Osteomyelitis ([place]) 05/07/2021   Abscess 05/07/2021   Sepsis due to cellulitis ([place]) 05/06/2021   Poor compliance with medication 03/17/2021   Fluid overload, unspecified 11/94/1740   Complication of vascular dialysis catheter 03/23/2020   Other disorders of phosphorus metabolism 01/28/2020   Hypertension 01/08/2020   Hypocalcemia 10/29/2019  ESRD on dialysis ([place]) 11/16/2017   Pruritus, unspecified 04/04/2017   Unspecified protein-calorie malnutrition ([place]) 11/11/2016   Headache, unspecified 10/29/2016   Encounter for immunization 10/19/2016   Iron deficiency anemia, unspecified 10/10/2016   Anemia in chronic kidney disease 09/30/2016   Coagulation defect, unspecified ([place]) 09/30/2016   Pneumonia due to Pseudomonas ([place]) 09/30/2016   Secondary hyperparathyroidism of renal origin ([place]) 09/30/2016   Hypothyroidism 10/29/2009   Type 2 diabetes mellitus ([place]) 04/14/2009   Hyperlipidemia 04/14/2009   OBESITY, MORBID 04/14/2009   Essential hypertension, benign 04/14/2009    Conditions to be addressed/monitored per PCP order:  DMII and great toe amputation  Care Plan : General Nursing  (Adult)  Updates made by Melissa Montane, RN since 05/21/2021 12:00 AM     Problem: Health management needs related to DMII and recent great toe amputation      Long-Range Goal: Development of Plan of Care to address health management needs related to DMII and recent great toe amputation   Start Date: 05/21/2021  Expected End Date: 08/19/2021  Priority: High  Note:   Current Barriers:  Chronic Disease Management support and  education needs related to DMII and great toe amputation  Mr. Fielden does not have a glucometer to check his blood sugar. He would like to improve his health and better manage his blood sugar. He eats one to two meals a day and is unable to use his exercise bike at this time while healing from recent great toe amputation. He is requesting a shower chair.   RNCM Clinical Goal(s):  Patient will verbalize understanding of plan for management of DMII and great toe amputation as evidenced by patient verbalization of self monitoring activity attend all scheduled medical appointments: 1/4 for post op, 1/4 for imaging, 1/11 with [facility] and 1/12 with Infectious Disease as evidenced by provider documentation in EMR        demonstrate improved adherence to prescribed treatment plan for DMII as evidenced by provider documentation in EMR work with pharmacist to address understanding of medications related to DMII as evidenced by review of EMR and patient or pharmacist report    through collaboration with RN Care manager, provider, and care team.   Interventions: Inter-disciplinary care team collaboration (see longitudinal plan of care) Evaluation of current treatment plan related to  self management and patient's adherence to plan as established by provider Collaborated with PCP, requesting an order for shower chair, glucometer and referral for eye exam Provided patient with information for Medical transportation provided by [facility] [phone]   Diabetes:  (Status: New goal.) Long Term Goal   Lab Results  Component Value Date   HGBA1C 6.0 (H) 05/07/2021  Assessed patient's understanding of A1c goal: <7% Provided education to patient about basic DM disease process; Reviewed medications with patient and discussed importance of medication adherence;        Reviewed prescribed diet with patient diabetic diet, eating 3 small meals a day with protein snacks in between; Counseled on importance of regular  laboratory monitoring as prescribed;        Discussed plans with patient for ongoing care management follow up and provided patient with direct contact information for care management team;      Reviewed scheduled/upcoming provider appointments including: scheduling follow up with PCP;         Referral made to pharmacy team for assistance with medication management;       Review of patient status, including review of consultants reports, relevant laboratory and  other test results, and medications completed;       Advised patient to discuss diabetes management with provider;      Assessed social determinant of health barriers;         Great toe amputation  (Status: New goal.) Long Term Goal  Discussed plans with patient for ongoing care management follow up and provided patient with direct contact information for care management team Provided education to patient re: wound care and exercises to do while sitting; Reviewed medications with patient and discussed importance of compliance; Reviewed scheduled/upcoming provider appointments including 1/4 for Post op, 1/4 for Imaging, 1/11 at [facility] and 1/12 with Infectious Disease; Discussed plans with patient for ongoing care management follow up and provided patient with direct contact information for care management team; Assessed social determinant of health barriers;   Patient Goals/Self-Care Activities: Take medications as prescribed   Attend all scheduled provider appointments Call pharmacy for medication refills 3-7 days in advance of running out of medications Perform all self care activities independently  Call provider office for new concerns or questions  schedule appointment with eye doctor check blood sugar at prescribed times: 4 times daily and when you have symptoms of low or high blood sugar enter blood sugar readings and medication or insulin into daily log take the blood sugar log to all doctor visits fill half of plate with  vegetables manage portion size prepare main meal at home 3 to 5 days each week       Follow Up:  Patient agrees to Care Plan and Follow-up.  Plan: The Managed Medicaid care management team will reach out to the patient again over the next 14 days.  Date/time of next scheduled RN care management/care coordination outreach:  06/07/21 @ 10:30am  Lurena Joiner RN, BSN [place] RN Care Coordinator

## 2021-05-21 NOTE — Patient Instructions (Signed)
Visit Information  Paul [name] was given information about Medicaid Managed Care team care coordination services as a part of their [place] Medicaid benefit. [name] verbally consented to engagement with the [facility] Managed Care team.   If you are experiencing a medical emergency, please call 911 or report to your local emergency department or urgent care.   If you have a non-emergency medical problem during routine business hours, please contact your provider's office and ask to speak with a nurse.   For questions related to your [facility], please call: [phone] or visit the homepage here: [email or web address]  If you would like to schedule transportation through your [facility], please call the following number at least 2 days in advance of your appointment: [phone].   Call the [place] at [phone], at any time, 24 hours a day, 7 days a week. If you are in danger or need immediate medical attention call 911.  If you would like help to quit smoking, call 1-800-QUIT-NOW 209-049-5896) OR Espaol: 1-855-Djelo-Ya (8-786-767-2094) o para ms informacin haga clic aqu or Text READY to 200-400 to register via text  Paul [name],   Please see education materials related to diabetes, wound care and exercises while sitting provided by MyChart link.  Patient has access to MyChart and can view provided education  Telephone follow up appointment with Managed Medicaid care management team member scheduled for:  Lurena Joiner RN, BSN [place] RN Care Coordinator   Following is a copy of your plan of care:  Care Plan : General Nursing  (Adult)  Updates made by Melissa Montane, RN since 05/21/2021 12:00 AM     Problem: Health management needs related to DMII and recent great toe amputation       Long-Range Goal: Development of Plan of Care to address health management needs related to DMII and recent great toe amputation   Start Date: 05/21/2021  Expected End Date: 08/19/2021  Priority: High  Note:   Current Barriers:  Chronic Disease Management support and education needs related to DMII and great toe amputation  Paul [name] does not have a glucometer to check his blood sugar. He would like to improve his health and better manage his blood sugar. He eats one to two meals a day and is unable to use his exercise bike at this time while healing from recent great toe amputation. He is requesting a shower chair.   RNCM Clinical Goal(s):  Patient will verbalize understanding of plan for management of DMII and great toe amputation as evidenced by patient verbalization of self monitoring activity attend all scheduled medical appointments: 1/4 for post op, 1/4 for imaging, 1/11 with [facility] and 1/12 with Infectious Disease as evidenced by provider documentation in EMR        demonstrate improved adherence to prescribed treatment plan for DMII as evidenced by provider documentation in EMR work with pharmacist to address understanding of medications related to DMII as evidenced by review of EMR and patient or pharmacist report    through collaboration with RN Care manager, provider, and care team.   Interventions: Inter-disciplinary care team collaboration (see longitudinal plan of care) Evaluation of current treatment plan related to  self management and patient's adherence to plan as established by provider Collaborated with PCP, requesting an order for shower chair, glucometer and referral for eye exam Provided patient with information for Medical transportation provided by [facility]  [phone]   Diabetes:  (Status: New goal.) Long Term Goal   Lab Results  Component Value Date   HGBA1C 6.0 (H) 05/07/2021  Assessed patient's understanding of A1c goal: <7% Provided education to patient about  basic DM disease process; Reviewed medications with patient and discussed importance of medication adherence;        Reviewed prescribed diet with patient diabetic diet, eating 3 small meals a day with protein snacks in between; Counseled on importance of regular laboratory monitoring as prescribed;        Discussed plans with patient for ongoing care management follow up and provided patient with direct contact information for care management team;      Reviewed scheduled/upcoming provider appointments including: scheduling follow up with PCP;         Referral made to pharmacy team for assistance with medication management;       Review of patient status, including review of consultants reports, relevant laboratory and other test results, and medications completed;       Advised patient to discuss diabetes management with provider;      Assessed social determinant of health barriers;         Great toe amputation  (Status: New goal.) Long Term Goal  Discussed plans with patient for ongoing care management follow up and provided patient with direct contact information for care management team Provided education to patient re: wound care and exercises to do while sitting; Reviewed medications with patient and discussed importance of compliance; Reviewed scheduled/upcoming provider appointments including 1/4 for Post op, 1/4 for Imaging, 1/11 at [facility] and 1/12 with Infectious Disease; Discussed plans with patient for ongoing care management follow up and provided patient with direct contact information for care management team; Assessed social determinant of health barriers;   Patient Goals/Self-Care Activities: Take medications as prescribed   Attend all scheduled provider appointments Call pharmacy for medication refills 3-7 days in advance of running out of medications Perform all self care activities independently  Call provider office for new concerns or questions  schedule appointment with  eye doctor check blood sugar at prescribed times: 4 times daily and when you have symptoms of low or high blood sugar enter blood sugar readings and medication or insulin into daily log take the blood sugar log to all doctor visits fill half of plate with vegetables manage portion size prepare main meal at home 3 to 5 days each week

## 2021-05-21 NOTE — Telephone Encounter (Signed)
I sw pt, he has already taken his surgical bandage off and been dressing changes daily. He will use gauze and ace bandage to wrap daily. D/c abx strip that was provided by the hospital in which he was wanting to get from [place]. I let him know we do not have such thing here. He has no signs of infection. Reminded him to keep off loading on surgical foot and elevate above chest level as much as possible. He already has gauze and ace bandage at home. He will keep appt with Erin on 05/26/21

## 2021-05-21 NOTE — Telephone Encounter (Signed)
Pt needed to reschedule due to PA Erin not in office. Pt states he need supplies [place] call pt for what type of supplies needed and when ready for pick up

## 2021-05-22 ENCOUNTER — Other Ambulatory Visit: Payer: Self-pay | Admitting: Student

## 2021-05-22 DIAGNOSIS — Z794 Long term (current) use of insulin: Secondary | ICD-10-CM

## 2021-05-22 DIAGNOSIS — Z89419 Acquired absence of unspecified great toe: Secondary | ICD-10-CM

## 2021-05-22 DIAGNOSIS — N186 End stage renal disease: Secondary | ICD-10-CM | POA: Diagnosis not present

## 2021-05-22 DIAGNOSIS — E1122 Type 2 diabetes mellitus with diabetic chronic kidney disease: Secondary | ICD-10-CM

## 2021-05-22 DIAGNOSIS — E1129 Type 2 diabetes mellitus with other diabetic kidney complication: Secondary | ICD-10-CM | POA: Diagnosis not present

## 2021-05-22 DIAGNOSIS — Z992 Dependence on renal dialysis: Secondary | ICD-10-CM | POA: Diagnosis not present

## 2021-05-22 MED ORDER — ACCU-CHEK GUIDE VI STRP
1.0000 | ORAL_STRIP | Freq: Two times a day (BID) | 12 refills | Status: DC
Start: 2021-05-22 — End: 2021-05-26

## 2021-05-22 MED ORDER — ACCU-CHEK FASTCLIX LANCETS MISC: 0 refills | Status: DC

## 2021-05-22 MED ORDER — ACCU-CHEK GUIDE ME W/DEVICE KIT: 1.0000 | PACK | Freq: Every day | 0 refills | Status: DC

## 2021-05-22 NOTE — Progress Notes (Signed)
Received a message from [place] Medicaid case manager that patient had recent great toe amputation and requesting orders for DME for shower chair, glucometer/supplies, and referral to ophthalmology for diabetic eye exam.  Orders placed. Pearla Dubonnet, MD

## 2021-05-23 DIAGNOSIS — L089 Local infection of the skin and subcutaneous tissue, unspecified: Secondary | ICD-10-CM | POA: Diagnosis not present

## 2021-05-23 DIAGNOSIS — E11628 Type 2 diabetes mellitus with other skin complications: Secondary | ICD-10-CM | POA: Diagnosis not present

## 2021-05-25 ENCOUNTER — Telehealth: Payer: Self-pay | Admitting: *Deleted

## 2021-05-25 NOTE — Telephone Encounter (Signed)
Received fax from pharmacy wanting clarification on Test strip and Lancets.  One says once daily and the other says 2 times daily.  Please contact pharmacy to clarify the Rx or resend with correct sig on both. Shevy Yaney Zimmerman Rumple, CMA

## 2021-05-26 ENCOUNTER — Ambulatory Visit (HOSPITAL_COMMUNITY)
Admission: RE | Admit: 2021-05-26 | Discharge: 2021-05-26 | Disposition: A | Discharge status: Home / Self Care | Payer: Medicaid Other | Source: Ambulatory Visit | Attending: Nephrology | Admitting: Nephrology

## 2021-05-26 ENCOUNTER — Encounter (HOSPITAL_COMMUNITY): Payer: Self-pay

## 2021-05-26 ENCOUNTER — Other Ambulatory Visit (HOSPITAL_COMMUNITY): Payer: Self-pay

## 2021-05-26 ENCOUNTER — Ambulatory Visit (INDEPENDENT_AMBULATORY_CARE_PROVIDER_SITE_OTHER): Payer: Medicaid Other | Admitting: Family

## 2021-05-26 ENCOUNTER — Ambulatory Visit (HOSPITAL_COMMUNITY)
Admission: RE | Admit: 2021-05-26 | Discharge: 2021-05-26 | Disposition: A | Discharge status: Home / Self Care | Payer: Medicaid Other | Source: Ambulatory Visit | Attending: Vascular Surgery | Admitting: Vascular Surgery

## 2021-05-26 ENCOUNTER — Other Ambulatory Visit (HOSPITAL_COMMUNITY): Payer: Self-pay | Admitting: Vascular Surgery

## 2021-05-26 ENCOUNTER — Telehealth (HOSPITAL_COMMUNITY): Payer: Self-pay

## 2021-05-26 ENCOUNTER — Ambulatory Visit (INDEPENDENT_AMBULATORY_CARE_PROVIDER_SITE_OTHER)
Admission: RE | Admit: 2021-05-26 | Discharge: 2021-05-26 | Disposition: A | Discharge status: Home / Self Care | Payer: Medicaid Other | Source: Ambulatory Visit | Attending: Vascular Surgery | Admitting: Vascular Surgery

## 2021-05-26 ENCOUNTER — Other Ambulatory Visit: Payer: Self-pay | Admitting: Student

## 2021-05-26 ENCOUNTER — Encounter: Payer: Self-pay | Admitting: Family

## 2021-05-26 ENCOUNTER — Other Ambulatory Visit: Payer: Self-pay

## 2021-05-26 DIAGNOSIS — Z992 Dependence on renal dialysis: Secondary | ICD-10-CM

## 2021-05-26 DIAGNOSIS — Z01818 Encounter for other preprocedural examination: Secondary | ICD-10-CM | POA: Diagnosis present

## 2021-05-26 DIAGNOSIS — E1122 Type 2 diabetes mellitus with diabetic chronic kidney disease: Secondary | ICD-10-CM

## 2021-05-26 DIAGNOSIS — N185 Chronic kidney disease, stage 5: Secondary | ICD-10-CM

## 2021-05-26 DIAGNOSIS — N186 End stage renal disease: Secondary | ICD-10-CM

## 2021-05-26 DIAGNOSIS — Z89432 Acquired absence of left foot: Secondary | ICD-10-CM

## 2021-05-26 MED ORDER — ACCU-CHEK GUIDE VI STRP: 1.0000 | ORAL_STRIP | Freq: Two times a day (BID) | 12 refills | Status: DC

## 2021-05-26 MED ORDER — ACCU-CHEK FASTCLIX LANCETS MISC: 0 refills | Status: DC

## 2021-05-26 NOTE — Telephone Encounter (Signed)
Pharmacy Transitions of Care Follow-up Telephone Call  Date of discharge: 05/14/21 Discharge Diagnosis: MSSA bacteremia/SVC thrombus  How have you been since you were released from the hospital? Patient well since discharge, no questions about meds at this time  Medication changes made at discharge:    START taking: ceFAZolin (ANCEF)  Eliquis DVT/PE Starter Pack (Apixaban Starter Pack (10mg  and 5mg ))  Eliquis (apixaban)  Start taking on: June 14, 2021 gabapentin (NEURONTIN)  midodrine (PROAMATINE)  polyethylene glycol (MIRALAX / GLYCOLAX)  CHANGE how you take: insulin glargine (Lantus)  STOP taking: amLODipine 5 MG tablet (NORVASC)   Medication changes verified by the patient? Yes    Medication Accessibility:  Home Pharmacy: [facility] [address]   Was the patient provided with refills on discharged medications? Yes   Have all prescriptions been transferred from [facility] to home pharmacy? Yes   Is the patient able to afford medications? Has insurance/eligible for copay card Notable copays: Eliquis copay $4.00/month    Medication Review:  APIXABAN (ELIQUIS)  Apixaban 10 mg BID initiated on 05/14/21. Will switch to apixaban 5 mg BID after 7 days (DATE 05/21/21).  - Discussed importance of taking medication around the same time everyday  - Reviewed potential DDIs with patient  - Advised patient of medications to avoid (NSAIDs, ASA)  - Educated that Tylenol (acetaminophen) will be the preferred analgesic to prevent risk of bleeding  - Emphasized importance of monitoring for signs and symptoms of bleeding (abnormal bruising, prolonged bleeding, nose bleeds, bleeding from gums, discolored urine, black tarry stools)  - Advised patient to alert all providers of anticoagulation therapy prior to starting a new medication or having a procedure    Follow-up Appointments:  PCP Hospital f/u appt confirmed? Scheduled to see Dr. Owens Shark on 06/02/21 @ 2:25pm.    [facility] f/u appt confirmed? Scheduled to see Dr. Donzetta Matters on 06/02/21 @ 9:40am.   If their condition worsens, is the pt aware to call PCP or go to the Emergency Dept.? Yes  Final Patient Assessment: Patient has f/u scheduled and refills at home pharmacy

## 2021-05-26 NOTE — Progress Notes (Signed)
Post-Op Visit Note   Patient: Paul [name]           Date of Birth: 1991/01/21           MRN: [id number] Visit Date: 05/26/2021 PCP: Orvis Brill, DO  Chief Complaint:  Chief Complaint  Patient presents with   Left Foot - Routine Post Op    05/08/21 left foot 1st ray amputation     HPI:  HPI The patient is a 31 year old gentleman seen status post left foot first ray amputation December 17.  He has been nonweightbearing. Ortho Exam On examination the incision is well approximated sutures appears to be healing well there is no drainage minimal edema no erythema no warmth  Visit Diagnoses: No diagnosis found.  Plan: Continue daily Dial soap cleansing.  Dry dressing changes.  Hope to harvest sutures in 1 more week.  He may shower. he would like for [place] to help him with getting a shower chair  Follow-Up Instructions: No follow-ups on file.   Imaging: No results found.  Orders:  No orders of the defined types were placed in this encounter.  No orders of the defined types were placed in this encounter.    PMFS History: Patient Active Problem List   Diagnosis Date Noted   Diabetic foot infection ([place])    Wound infection 05/07/2021   MSSA bacteremia 05/07/2021   Osteomyelitis ([place]) 05/07/2021   Abscess 05/07/2021   Sepsis due to cellulitis ([place]) 05/06/2021   Poor compliance with medication 03/17/2021   Fluid overload, unspecified 68/34/1962   Complication of vascular dialysis catheter 03/23/2020   Other disorders of phosphorus metabolism 01/28/2020   Hypertension 01/08/2020   Hypocalcemia 10/29/2019   ESRD on dialysis ([place]) 11/16/2017   Pruritus, unspecified 04/04/2017   Unspecified protein-calorie malnutrition ([place]) 11/11/2016   Headache, unspecified 10/29/2016   Encounter for immunization 10/19/2016   Iron deficiency anemia, unspecified 10/10/2016   Anemia in chronic kidney disease 09/30/2016   Coagulation defect, unspecified ([place]) 09/30/2016   Pneumonia due  to Pseudomonas ([place]) 09/30/2016   Secondary hyperparathyroidism of renal origin ([place]) 09/30/2016   Hypothyroidism 10/29/2009   Type 2 diabetes mellitus ([place]) 04/14/2009   Hyperlipidemia 04/14/2009   OBESITY, MORBID 04/14/2009   Essential hypertension, benign 04/14/2009   Past Medical History:  Diagnosis Date   Acute hypoxemic respiratory failure ([place]) 09/11/2016   Anemia    Chronic kidney disease    ARF on CRF Dialysis T/TH/Sa   Diabetes mellitus    Type II   End stage renal disease on dialysis ([place])    [place]    GERD (gastroesophageal reflux disease)    diet controlled   HLD (hyperlipidemia)    Hypertension    Hypothyroidism    Morbid obesity ([place])    Sacral wound    resolved   Thyroid disease     Family History  Problem Relation Age of Onset   Heart disease Mother    Hypertension Mother     Past Surgical History:  Procedure Laterality Date   AMPUTATION Left 05/08/2021   Procedure: AMPUTATION GREAT TOE;  Surgeon: Newt Minion, MD;  Location: [facility];  Service: Orthopedics;  Laterality: Left;   AV FISTULA PLACEMENT Left 09/26/2016   Procedure: LEFT UPPER ARM ARTERIOVENOUS (AV) FISTULA CREATION;  Surgeon: Elam Dutch, MD;  Location[facility];  Service: Vascular;  Laterality: Left;   AV FISTULA PLACEMENT Left 12/02/2016   Procedure: INSERTION OF ARTERIOVENOUS GORE-TEX GRAFT LEFT UPPER  ARM USING A 4-7MM  BY 45CM GRAFT ;  Surgeon: Rosetta Posner, MD;  Location: [place];  Service: Vascular;  Laterality: Left;   AV FISTULA PLACEMENT Right 07/03/2020   Procedure: INSERTION OF ARTERIOVENOUS (AV) GORE-TEX GRAFT RIGHT UPPER ARM;  Surgeon: Marty Heck, MD;  Location: [place];  Service: Vascular;  Laterality: Right;   [place] Right 05/06/2020   Procedure: FIRST STAGE RIGHT [place];  Surgeon: Serafina Mitchell, MD;  Location: [place];  Service: Vascular;  Laterality: Right;   EXCHANGE OF A DIALYSIS CATHETER Right 10/08/2016   Procedure:  EXCHANGE OF A DIALYSIS CATHETER-RIGHT INTERNAL JUGULAR;  Surgeon: Conrad Oceana, MD;  Location: [place];  Service: Vascular;  Laterality: Right;   INSERTION OF DIALYSIS CATHETER Right 09/26/2016   Procedure: INSERTION OF DIALYSIS CATHETER - Right Internal Jugular Placement;  Surgeon: Elam Dutch, MD;  Location: [place];  Service: Vascular;  Laterality: Right;   INSERTION OF DIALYSIS CATHETER Left 03/22/2018   Procedure: INSERTION OF DIALYSIS CATHETER;  Surgeon: Marty Heck, MD;  Location: [place];  Service: Vascular;  Laterality: Left;   IR FLUORO GUIDE CV LINE RIGHT  03/26/2021   IR FLUORO GUIDE CV LINE RIGHT  05/13/2021   IR REMOVAL TUN CV CATH W/O FL  05/11/2021   IR US GUIDE VASC ACCESS RIGHT  03/26/2021   IR US GUIDE VASC ACCESS RIGHT  05/13/2021   REVISON OF ARTERIOVENOUS FISTULA Left 03/22/2018   Procedure: LEFT UPPER EXTREMITY ARTERIOVENOUS  REVISION WITH GORE-TEX GRAFT.;  Surgeon: Marty Heck, MD;  Location: [place];  Service: Vascular;  Laterality: Left;   TEE WITHOUT CARDIOVERSION N/A 05/12/2021   Procedure: TRANSESOPHAGEAL ECHOCARDIOGRAM (TEE);  Surgeon: Werner Lean, MD;  Location: [facility] ENDOSCOPY;  Service: Cardiovascular;  Laterality: N/A;   THROMBECTOMY Left 08/26/2019   thrombectomy of LUA loop AVG   THROMBECTOMY AND REVISION OF ARTERIOVENTOUS (AV) GORETEX  GRAFT Right 07/27/2020   Procedure: INSERTION OF RIGHT ARM LOOP GRAFT WITH EXCISION OF RIGHT ARM BRACHIAL AXILLARY GRAFT;  Surgeon: Marty Heck, MD;  Location: [place];  Service: Vascular;  Laterality: Right;   VISCERAL ANGIOGRAPHY Right 06/17/2020   Procedure: CENTRAL VENO;  Surgeon: Cherre Robins, MD;  Location: [facility] CV LAB;  Service: Cardiovascular;  Laterality: Right;   Social History   Occupational History   Not on file  Tobacco Use   Smoking status: Never   Smokeless tobacco: Never  Vaping Use   Vaping Use: Never used  Substance and Sexual Activity   Alcohol use: No   Drug  use: Yes    Frequency: 2.0 times per week    Types: Marijuana    Comment: Last time 07/23/20   Sexual activity: Not on file

## 2021-06-02 ENCOUNTER — Other Ambulatory Visit: Payer: Self-pay

## 2021-06-02 ENCOUNTER — Encounter: Payer: Self-pay | Admitting: Vascular Surgery

## 2021-06-02 ENCOUNTER — Ambulatory Visit: Payer: Medicaid Other | Admitting: Student

## 2021-06-02 ENCOUNTER — Ambulatory Visit: Payer: Medicaid Other | Admitting: Family

## 2021-06-02 ENCOUNTER — Ambulatory Visit (INDEPENDENT_AMBULATORY_CARE_PROVIDER_SITE_OTHER): Payer: Medicaid Other | Admitting: Vascular Surgery

## 2021-06-02 VITALS — BP 112/74 | HR 92 | Temp 98.0°F | Resp 20 | Ht 73.0 in | Wt 330.0 lb

## 2021-06-02 DIAGNOSIS — Z992 Dependence on renal dialysis: Secondary | ICD-10-CM | POA: Diagnosis not present

## 2021-06-02 DIAGNOSIS — N186 End stage renal disease: Secondary | ICD-10-CM

## 2021-06-02 NOTE — H&P (View-Only) (Signed)
Patient ID: Paul [name], male   DOB: 1990/09/22, 31 y.o.   MRN: [id number]  Reason for Consult: Follow-up   Referred by Orvis Brill, DO  Subjective:     HPI:  Paul [name] is a 31 y.o. male very pleasant gentleman with end-stage renal disease currently dialyzing via catheter.  Most recently he had a right upper extremity axillary loop graft after having a brachial graft in the right.  He is also had a left arm AV graft and at least one fistula attempted on the right.  All of these have failed he does have some history of stenting as well.  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  Right IJ was noted to be occluded when the catheter was recently placed on December 22.  He is now here for discussion of permanent access.  He is right-hand dominant denies any history of upper extremity swelling.  Past Medical History:  Diagnosis Date   Acute hypoxemic respiratory failure ([place]) 09/11/2016   Anemia    Chronic kidney disease    ARF on CRF Dialysis T/TH/Sa   Diabetes mellitus    Type II   End stage renal disease on dialysis ([place])    [place]    GERD (gastroesophageal reflux disease)    diet controlled   HLD (hyperlipidemia)    Hypertension    Hypothyroidism    Morbid obesity ([place])    Sacral wound    resolved   Thyroid disease    Family History  Problem Relation Age of Onset   Heart disease Mother    Hypertension Mother    Past Surgical History:  Procedure Laterality Date   AMPUTATION Left 05/08/2021   Procedure: AMPUTATION GREAT TOE;  Surgeon: Newt Minion, MD;  Location: [place];  Service: Orthopedics;  Laterality: Left;   AV FISTULA PLACEMENT Left 09/26/2016   Procedure: LEFT UPPER ARM ARTERIOVENOUS (AV) FISTULA CREATION;  Surgeon: Elam Dutch, MD;  Location: [place];  Service: Vascular;  Laterality: Left;   AV FISTULA PLACEMENT Left 12/02/2016   Procedure: INSERTION OF ARTERIOVENOUS GORE-TEX GRAFT LEFT UPPER  ARM USING A 4-7MM BY 45CM GRAFT ;  Surgeon:  Rosetta Posner, MD;  Location: [place];  Service: Vascular;  Laterality: Left;   AV FISTULA PLACEMENT Right 07/03/2020   Procedure: INSERTION OF ARTERIOVENOUS (AV) GORE-TEX GRAFT RIGHT UPPER ARM;  Surgeon: Marty Heck, MD;  Location: [place];  Service: Vascular;  Laterality: Right;   [place] Right 05/06/2020   Procedure: FIRST STAGE RIGHT [place];  Surgeon: Serafina Mitchell, MD;  Location: [place];  Service: Vascular;  Laterality: Right;   EXCHANGE OF A DIALYSIS CATHETER Right 10/08/2016   Procedure: EXCHANGE OF A DIALYSIS CATHETER-RIGHT INTERNAL JUGULAR;  Surgeon: Conrad Nocatee, MD;  Location: [place];  Service: Vascular;  Laterality: Right;   INSERTION OF DIALYSIS CATHETER Right 09/26/2016   Procedure: INSERTION OF DIALYSIS CATHETER - Right Internal Jugular Placement;  Surgeon: Elam Dutch, MD;  Location: [place];  Service: Vascular;  Laterality: Right;   INSERTION OF DIALYSIS CATHETER Left 03/22/2018   Procedure: INSERTION OF DIALYSIS CATHETER;  Surgeon: Marty Heck, MD;  Location: [place];  Service: Vascular;  Laterality: Left;   IR FLUORO GUIDE CV LINE RIGHT  03/26/2021   IR FLUORO GUIDE CV LINE RIGHT  05/13/2021   IR REMOVAL TUN CV CATH W/O FL  05/11/2021   IR US GUIDE VASC ACCESS RIGHT  03/26/2021   IR US  GUIDE VASC ACCESS RIGHT  05/13/2021   REVISON OF ARTERIOVENOUS FISTULA Left 03/22/2018   Procedure: LEFT UPPER EXTREMITY ARTERIOVENOUS  REVISION WITH GORE-TEX GRAFT.;  Surgeon: Marty Heck, MD;  Location: [place];  Service: Vascular;  Laterality: Left;   TEE WITHOUT CARDIOVERSION N/A 05/12/2021   Procedure: TRANSESOPHAGEAL ECHOCARDIOGRAM (TEE);  Surgeon: Werner Lean, MD;  Location[facility];  Service: Cardiovascular;  Laterality: N/A;   THROMBECTOMY Left 08/26/2019   thrombectomy of LUA loop AVG   THROMBECTOMY AND REVISION OF ARTERIOVENTOUS (AV) GORETEX  GRAFT Right 07/27/2020   Procedure: INSERTION OF RIGHT ARM LOOP  GRAFT WITH EXCISION OF RIGHT ARM BRACHIAL AXILLARY GRAFT;  Surgeon: Marty Heck, MD;  Location: [place];  Service: Vascular;  Laterality: Right;   VISCERAL ANGIOGRAPHY Right 06/17/2020   Procedure: CENTRAL VENO;  Surgeon: Cherre Robins, MD;  Location: [place] [facility];  Service: Cardiovascular;  Laterality: Right;    Short Social History:  Social History   Tobacco Use   Smoking status: Never   Smokeless tobacco: Never  Substance Use Topics   Alcohol use: No    Allergies  Allergen Reactions   Hydralazine Hcl Other (See Comments)    DRUG-INDUCED LUPUS    Current Outpatient Medications  Medication Sig Dispense Refill   Accu-Chek FastClix Lancets MISC USE AS DIRECTED TWICE DAILY 102 each 0   acetaminophen (TYLENOL) 500 MG tablet Take 1,000 mg by mouth every 6 (six) hours as needed (for pain.).     [START ON 06/14/2021] apixaban (ELIQUIS) 5 MG TABS tablet Take 1 tablet (5 mg total) by mouth 2 (two) times daily. START TAKING ONLY AFTER STARTER PACK HAS BEEN COMPLETED 60 tablet 2   Blood Glucose Monitoring Suppl (ACCU-CHEK GUIDE ME) w/Device KIT 1 kit by Does not apply route daily. 1 kit 0   calcium carbonate (TUMS - DOSED IN MG ELEMENTAL CALCIUM) 500 MG chewable tablet Chew 1,000-1,500 mg by mouth daily as needed for indigestion or heartburn.     ceFAZolin (ANCEF) 2-4 GM/100ML-% IVPB Inject 100 mLs (2 g total) into the vein every Tuesday, Thursday, and Saturday at 6 PM. 1 each    cinacalcet (SENSIPAR) 60 MG tablet Take 60 mg by mouth every other day.     ferric citrate (AURYXIA) 1 GM 210 MG(Fe) tablet Take 420-840 mg by mouth See admin instructions. Take 3-4 tablets (630 mg-840 mg) by mouth with each meal & take 1-2 tablets (210-420 mg) by mouth with each snack.     gabapentin (NEURONTIN) 100 MG capsule Take 1 capsule (100 mg total) by mouth 3 (three) times daily. 90 capsule 1   glucose blood (ACCU-CHEK GUIDE) test strip 1 each by Other route 2 (two) times daily. Use as instructed  100 each 12   heparin 1000 unit/mL SOLN injection Heparin Sodium (Porcine) 1,000 Units/mL Systemic     insulin glargine (LANTUS) 100 UNIT/ML injection Inject 0.08 mLs (8 Units total) into the skin daily. 10 mL 2   levothyroxine (SYNTHROID) 75 MCG tablet TAKE 1 TABLET BY MOUTH ONCE DAILY BEFORE BREAKFAST 30 tablet 0   midodrine (PROAMATINE) 5 MG tablet Take 1 tablet (5 mg total) by mouth 3 (three) times daily with meals. 90 tablet 2   multivitamin (RENA-VIT) TABS tablet Take 1 tablet by mouth at bedtime. (Patient taking differently: Take 1 tablet by mouth every evening.) 30 tablet 0   oxyCODONE-acetaminophen (PERCOCET) 7.5-325 MG tablet Take 1 tablet by mouth every 6 (six) hours as needed for severe  pain or moderate pain. 30 tablet 0   polyethylene glycol (MIRALAX / GLYCOLAX) 17 g packet Take 17 g by mouth daily as needed. 14 each 0   rosuvastatin (CRESTOR) 20 MG tablet Take 2 tablets by mouth once daily (Patient taking differently: Take 20 mg by mouth daily.) 90 tablet 0   Syringe, Disposable, (B-D SYRINGE SLIP TIP 30CC) 30 ML MISC 1 Syringe by Does not apply route daily. 30 each 11   No current facility-administered medications for this visit.    Review of Systems  Constitutional:  Constitutional negative. HENT: HENT negative.  Eyes: Eyes negative.  Cardiovascular: Cardiovascular negative.  GI: Gastrointestinal negative.  Musculoskeletal: Musculoskeletal negative.  Skin: Skin negative.  Neurological: Neurological negative. Hematologic: Hematologic/lymphatic negative.  Psychiatric: Psychiatric negative.       Objective:  Objective   Vitals:   06/02/21 0943  BP: 112/74  Pulse: 92  Resp: 20  Temp: 98 F (36.7 C)  SpO2: 96%  Weight: (!) 330 lb (149.7 kg)  Height: _0  (1.854 m)   Body mass index is 43.54 kg/m.  Physical Exam HENT:     Head: Normocephalic.     Nose:     Comments: Wearing a mask Eyes:     Pupils: Pupils are equal, round, and reactive to light.   Cardiovascular:     Rate and Rhythm: Normal rate.  Pulmonary:     Effort: Pulmonary effort is normal.  Abdominal:     General: Abdomen is flat.     Palpations: Abdomen is soft.  Musculoskeletal:     Cervical back: Neck supple.     Comments: Incisions of bilateral upper extremities are well-healed  Skin:    General: Skin is warm and dry.     Capillary Refill: Capillary refill takes less than 2 seconds.  Neurological:     General: No focal deficit present.     Mental Status: He is alert.  Psychiatric:        Mood and Affect: Mood normal.        Behavior: Behavior normal.    Data:  Right Cephalic    Diameter (cm) Depth (cm) Findings   +-----------------+-------------+----------+--------+   Antecubital fossa     0.35                            +-----------------+-------------+----------+--------+   Prox forearm          0.36                            +-----------------+-------------+----------+--------+   Mid forearm           0.37                            +-----------------+-------------+----------+--------+   +-----------------+-------------+----------+---------+   Right Basilic     Diameter (cm) Depth (cm) Findings    +-----------------+-------------+----------+---------+   Prox upper arm        0.40         3.08                +-----------------+-------------+----------+---------+   Mid upper arm         0.46         2.81                +-----------------+-------------+----------+---------+   Dist upper arm  0.47         2.52    branching   +-----------------+-------------+----------+---------+   Antecubital fossa     0.41         2.30                +-----------------+-------------+----------+---------+   Prox forearm          0.34         1.30                +-----------------+-------------+----------+---------+   +-----------------+-------------+----------+--------+   Left Basilic      Diameter (cm) Depth (cm) Findings    +-----------------+-------------+----------+--------+   Mid upper arm         0.45         3.29               +-----------------+-------------+----------+--------+   Dist upper arm        0.46         2.62               +-----------------+-------------+----------+--------+   Antecubital fossa     0.41         1.98               +-----------------+-------------+----------+--------+   Summary: Right: Patent basilic vein.         The cephalic vein is patent in the forearm, out of the fascia         and tortuous in the upper arm (may be a branch).  Left: Patent basilic vein.        Cephalic vein not visualized.   Right Pre-Dialysis Findings:  +-----------------------+----------+--------------------+---------+--------  +   Location                PSV (cm/s) Intralum. Diam. (cm) Waveform   Comments   +-----------------------+----------+--------------------+---------+--------  +   Brachial Antecub. fossa 95         0.40                 triphasic              +-----------------------+----------+--------------------+---------+--------  +   Radial Art at Wrist     69         0.31                 triphasic              +-----------------------+----------+--------------------+---------+--------  +   Ulnar Art at Wrist      58         0.23                 triphasic              +-----------------------+----------+--------------------+---------+--------  +   Left Pre-Dialysis Findings:  +-----------------------+----------+--------------------+---------+--------  +   Location                PSV (cm/s) Intralum. Diam. (cm) Waveform   Comments   +-----------------------+----------+--------------------+---------+--------  +   Brachial Antecub. fossa 87         0.45                 triphasic              +-----------------------+----------+--------------------+---------+--------  +   Radial Art at Wrist     54         0.24  triphasic               +-----------------------+----------+--------------------+---------+--------  +   Ulnar Art at Wrist      59         0.22                 triphasic              +-----------------------+----------+--------------------+---------+--------  +       Assessment/Plan:     31 year old male with history of bilateral upper extremity arteriovenous access which has all failed.  He has access through an occluded right IJ currently.  He is on anticoagulation for recent diagnosis of SVC thrombus.  He would not be a good candidate for femoral access given his habitus.  For these reasons we will begin with bilateral upper extremity venography.  He can continue anticoagulation for this procedure.  From there we can plan further access.  On vein mapping last week appears he possibly does have veins for fistula creation.     Waynetta Sandy MD [facility]

## 2021-06-02 NOTE — Progress Notes (Signed)
Patient ID: Paul [name], male   DOB: 1990/09/22, 31 y.o.   MRN: [id number]  Reason for Consult: Follow-up   Referred by Orvis Brill, DO  Subjective:     HPI:  Paul [name] is a 31 y.o. male very pleasant gentleman with end-stage renal disease currently dialyzing via catheter.  Most recently he had a right upper extremity axillary loop graft after having a brachial graft in the right.  He is also had a left arm AV graft and at least one fistula attempted on the right.  All of these have failed he does have some history of stenting as well.  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  Right IJ was noted to be occluded when the catheter was recently placed on December 22.  He is now here for discussion of permanent access.  He is right-hand dominant denies any history of upper extremity swelling.  Past Medical History:  Diagnosis Date   Acute hypoxemic respiratory failure ([place]) 09/11/2016   Anemia    Chronic kidney disease    ARF on CRF Dialysis T/TH/Sa   Diabetes mellitus    Type II   End stage renal disease on dialysis ([place])    [place]    GERD (gastroesophageal reflux disease)    diet controlled   HLD (hyperlipidemia)    Hypertension    Hypothyroidism    Morbid obesity ([place])    Sacral wound    resolved   Thyroid disease    Family History  Problem Relation Age of Onset   Heart disease Mother    Hypertension Mother    Past Surgical History:  Procedure Laterality Date   AMPUTATION Left 05/08/2021   Procedure: AMPUTATION GREAT TOE;  Surgeon: Newt Minion, MD;  Location: [place];  Service: Orthopedics;  Laterality: Left;   AV FISTULA PLACEMENT Left 09/26/2016   Procedure: LEFT UPPER ARM ARTERIOVENOUS (AV) FISTULA CREATION;  Surgeon: Elam Dutch, MD;  Location: [place];  Service: Vascular;  Laterality: Left;   AV FISTULA PLACEMENT Left 12/02/2016   Procedure: INSERTION OF ARTERIOVENOUS GORE-TEX GRAFT LEFT UPPER  ARM USING A 4-7MM BY 45CM GRAFT ;  Surgeon:  Rosetta Posner, MD;  Location: [place];  Service: Vascular;  Laterality: Left;   AV FISTULA PLACEMENT Right 07/03/2020   Procedure: INSERTION OF ARTERIOVENOUS (AV) GORE-TEX GRAFT RIGHT UPPER ARM;  Surgeon: Marty Heck, MD;  Location: [place];  Service: Vascular;  Laterality: Right;   [place] Right 05/06/2020   Procedure: FIRST STAGE RIGHT [place];  Surgeon: Serafina Mitchell, MD;  Location: [place];  Service: Vascular;  Laterality: Right;   EXCHANGE OF A DIALYSIS CATHETER Right 10/08/2016   Procedure: EXCHANGE OF A DIALYSIS CATHETER-RIGHT INTERNAL JUGULAR;  Surgeon: Conrad Severance, MD;  Location: [place];  Service: Vascular;  Laterality: Right;   INSERTION OF DIALYSIS CATHETER Right 09/26/2016   Procedure: INSERTION OF DIALYSIS CATHETER - Right Internal Jugular Placement;  Surgeon: Elam Dutch, MD;  Location: [place];  Service: Vascular;  Laterality: Right;   INSERTION OF DIALYSIS CATHETER Left 03/22/2018   Procedure: INSERTION OF DIALYSIS CATHETER;  Surgeon: Marty Heck, MD;  Location: [place];  Service: Vascular;  Laterality: Left;   IR FLUORO GUIDE CV LINE RIGHT  03/26/2021   IR FLUORO GUIDE CV LINE RIGHT  05/13/2021   IR REMOVAL TUN CV CATH W/O FL  05/11/2021   IR US GUIDE VASC ACCESS RIGHT  03/26/2021   IR US  GUIDE VASC ACCESS RIGHT  05/13/2021   REVISON OF ARTERIOVENOUS FISTULA Left 03/22/2018   Procedure: LEFT UPPER EXTREMITY ARTERIOVENOUS  REVISION WITH GORE-TEX GRAFT.;  Surgeon: Marty Heck, MD;  Location: [place];  Service: Vascular;  Laterality: Left;   TEE WITHOUT CARDIOVERSION N/A 05/12/2021   Procedure: TRANSESOPHAGEAL ECHOCARDIOGRAM (TEE);  Surgeon: Werner Lean, MD;  Location[facility] END[facility];  Service: Cardiovascular;  Laterality: N/A;   THROMBECTOMY Left 08/26/2019   thrombectomy of LUA loop AVG   THROMBECTOMY AND REVISION OF ARTERIOVENTOUS (AV) GORETEX  GRAFT Right 07/27/2020   Procedure: INSERTION OF RIGHT ARM LOOP  GRAFT WITH EXCISION OF RIGHT ARM BRACHIAL AXILLARY GRAFT;  Surgeon: Marty Heck, MD;  Location: [place];  Service: Vascular;  Laterality: Right;   VISCERAL ANGIOGRAPHY Right 06/17/2020   Procedure: CENTRAL VENO;  Surgeon: Cherre Robins, MD;  Location: [place] [facility];  Service: Cardiovascular;  Laterality: Right;    Short Social History:  Social History   Tobacco Use   Smoking status: Never   Smokeless tobacco: Never  Substance Use Topics   Alcohol use: No    Allergies  Allergen Reactions   Hydralazine Hcl Other (See Comments)    DRUG-INDUCED LUPUS    Current Outpatient Medications  Medication Sig Dispense Refill   Accu-Chek FastClix Lancets MISC USE AS DIRECTED TWICE DAILY 102 each 0   acetaminophen (TYLENOL) 500 MG tablet Take 1,000 mg by mouth every 6 (six) hours as needed (for pain.).     [START ON 06/14/2021] apixaban (ELIQUIS) 5 MG TABS tablet Take 1 tablet (5 mg total) by mouth 2 (two) times daily. START TAKING ONLY AFTER STARTER PACK HAS BEEN COMPLETED 60 tablet 2   Blood Glucose Monitoring Suppl (ACCU-CHEK GUIDE ME) w/Device KIT 1 kit by Does not apply route daily. 1 kit 0   calcium carbonate (TUMS - DOSED IN MG ELEMENTAL CALCIUM) 500 MG chewable tablet Chew 1,000-1,500 mg by mouth daily as needed for indigestion or heartburn.     ceFAZolin (ANCEF) 2-4 GM/100ML-% IVPB Inject 100 mLs (2 g total) into the vein every Tuesday, Thursday, and Saturday at 6 PM. 1 each    cinacalcet (SENSIPAR) 60 MG tablet Take 60 mg by mouth every other day.     ferric citrate (AURYXIA) 1 GM 210 MG(Fe) tablet Take 420-840 mg by mouth See admin instructions. Take 3-4 tablets (630 mg-840 mg) by mouth with each meal & take 1-2 tablets (210-420 mg) by mouth with each snack.     gabapentin (NEURONTIN) 100 MG capsule Take 1 capsule (100 mg total) by mouth 3 (three) times daily. 90 capsule 1   glucose blood (ACCU-CHEK GUIDE) test strip 1 each by Other route 2 (two) times daily. Use as instructed  100 each 12   heparin 1000 unit/mL SOLN injection Heparin Sodium (Porcine) 1,000 Units/mL Systemic     insulin glargine (LANTUS) 100 UNIT/ML injection Inject 0.08 mLs (8 Units total) into the skin daily. 10 mL 2   levothyroxine (SYNTHROID) 75 MCG tablet TAKE 1 TABLET BY MOUTH ONCE DAILY BEFORE BREAKFAST 30 tablet 0   midodrine (PROAMATINE) 5 MG tablet Take 1 tablet (5 mg total) by mouth 3 (three) times daily with meals. 90 tablet 2   multivitamin (RENA-VIT) TABS tablet Take 1 tablet by mouth at bedtime. (Patient taking differently: Take 1 tablet by mouth every evening.) 30 tablet 0   oxyCODONE-acetaminophen (PERCOCET) 7.5-325 MG tablet Take 1 tablet by mouth every 6 (six) hours as needed for severe  pain or moderate pain. 30 tablet 0   polyethylene glycol (MIRALAX / GLYCOLAX) 17 g packet Take 17 g by mouth daily as needed. 14 each 0   rosuvastatin (CRESTOR) 20 MG tablet Take 2 tablets by mouth once daily (Patient taking differently: Take 20 mg by mouth daily.) 90 tablet 0   Syringe, Disposable, (B-D SYRINGE SLIP TIP 30CC) 30 ML MISC 1 Syringe by Does not apply route daily. 30 each 11   No current facility-administered medications for this visit.    Review of Systems  Constitutional:  Constitutional negative. HENT: HENT negative.  Eyes: Eyes negative.  Cardiovascular: Cardiovascular negative.  GI: Gastrointestinal negative.  Musculoskeletal: Musculoskeletal negative.  Skin: Skin negative.  Neurological: Neurological negative. Hematologic: Hematologic/lymphatic negative.  Psychiatric: Psychiatric negative.       Objective:  Objective   Vitals:   06/02/21 0943  BP: 112/74  Pulse: 92  Resp: 20  Temp: 98 F (36.7 C)  SpO2: 96%  Weight: (!) 330 lb (149.7 kg)  Height: _0  (1.854 m)   Body mass index is 43.54 kg/m.  Physical Exam HENT:     Head: Normocephalic.     Nose:     Comments: Wearing a mask Eyes:     Pupils: Pupils are equal, round, and reactive to light.   Cardiovascular:     Rate and Rhythm: Normal rate.  Pulmonary:     Effort: Pulmonary effort is normal.  Abdominal:     General: Abdomen is flat.     Palpations: Abdomen is soft.  Musculoskeletal:     Cervical back: Neck supple.     Comments: Incisions of bilateral upper extremities are well-healed  Skin:    General: Skin is warm and dry.     Capillary Refill: Capillary refill takes less than 2 seconds.  Neurological:     General: No focal deficit present.     Mental Status: He is alert.  Psychiatric:        Mood and Affect: Mood normal.        Behavior: Behavior normal.    Data:  Right Cephalic    Diameter (cm) Depth (cm) Findings   +-----------------+-------------+----------+--------+   Antecubital fossa     0.35                            +-----------------+-------------+----------+--------+   Prox forearm          0.36                            +-----------------+-------------+----------+--------+   Mid forearm           0.37                            +-----------------+-------------+----------+--------+   +-----------------+-------------+----------+---------+   Right Basilic     Diameter (cm) Depth (cm) Findings    +-----------------+-------------+----------+---------+   Prox upper arm        0.40         3.08                +-----------------+-------------+----------+---------+   Mid upper arm         0.46         2.81                +-----------------+-------------+----------+---------+   Dist upper arm  0.47         2.52    branching   +-----------------+-------------+----------+---------+   Antecubital fossa     0.41         2.30                +-----------------+-------------+----------+---------+   Prox forearm          0.34         1.30                +-----------------+-------------+----------+---------+   +-----------------+-------------+----------+--------+   Left Basilic      Diameter (cm) Depth (cm) Findings    +-----------------+-------------+----------+--------+   Mid upper arm         0.45         3.29               +-----------------+-------------+----------+--------+   Dist upper arm        0.46         2.62               +-----------------+-------------+----------+--------+   Antecubital fossa     0.41         1.98               +-----------------+-------------+----------+--------+   Summary: Right: Patent basilic vein.         The cephalic vein is patent in the forearm, out of the fascia         and tortuous in the upper arm (may be a branch).  Left: Patent basilic vein.        Cephalic vein not visualized.   Right Pre-Dialysis Findings:  +-----------------------+----------+--------------------+---------+--------  +   Location                PSV (cm/s) Intralum. Diam. (cm) Waveform   Comments   +-----------------------+----------+--------------------+---------+--------  +   Brachial Antecub. fossa 95         0.40                 triphasic              +-----------------------+----------+--------------------+---------+--------  +   Radial Art at Wrist     69         0.31                 triphasic              +-----------------------+----------+--------------------+---------+--------  +   Ulnar Art at Wrist      58         0.23                 triphasic              +-----------------------+----------+--------------------+---------+--------  +   Left Pre-Dialysis Findings:  +-----------------------+----------+--------------------+---------+--------  +   Location                PSV (cm/s) Intralum. Diam. (cm) Waveform   Comments   +-----------------------+----------+--------------------+---------+--------  +   Brachial Antecub. fossa 87         0.45                 triphasic              +-----------------------+----------+--------------------+---------+--------  +   Radial Art at Wrist     54         0.24  triphasic               +-----------------------+----------+--------------------+---------+--------  +   Ulnar Art at Wrist      59         0.22                 triphasic              +-----------------------+----------+--------------------+---------+--------  +       Assessment/Plan:     31 year old male with history of bilateral upper extremity arteriovenous access which has all failed.  He has access through an occluded right IJ currently.  He is on anticoagulation for recent diagnosis of SVC thrombus.  He would not be a good candidate for femoral access given his habitus.  For these reasons we will begin with bilateral upper extremity venography.  He can continue anticoagulation for this procedure.  From there we can plan further access.  On vein mapping last week appears he possibly does have veins for fistula creation.     Waynetta Sandy MD [facility]

## 2021-06-03 ENCOUNTER — Inpatient Hospital Stay: Payer: Medicaid Other | Admitting: Internal Medicine

## 2021-06-03 ENCOUNTER — Ambulatory Visit (INDEPENDENT_AMBULATORY_CARE_PROVIDER_SITE_OTHER): Payer: Medicaid Other | Admitting: Orthopedic Surgery

## 2021-06-03 VITALS — Ht 73.0 in | Wt 330.0 lb

## 2021-06-03 DIAGNOSIS — Z89432 Acquired absence of left foot: Secondary | ICD-10-CM

## 2021-06-04 ENCOUNTER — Encounter: Payer: Self-pay | Admitting: Orthopedic Surgery

## 2021-06-04 NOTE — Progress Notes (Signed)
Office Visit Note   Patient: Paul [name]           Date of Birth: 09/01/1990           MRN: [id number] Visit Date: 06/03/2021              Requested by: Orvis Brill, DO [address] [place],  [place] [address] PCP: Orvis Brill, DO  Chief Complaint  Patient presents with   Left Foot - Follow-up    05/08/21 left foot 1st ray amputation         HPI: Patient is a 31 year old gentleman who is 4 weeks status post left foot first ray amputation.  Patient denies any problems he is still on IV antibiotics denies any drainage.  Assessment & Plan: Visit Diagnoses:  1. 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   Ortho Exam  Patient is alert, oriented, no adenopathy, well-dressed, normal affect, normal respiratory effort. Examination the incision is well approximated with good epithelization there is no redness cellulitis or drainage.  Imaging: No results found. No images are attached to the encounter.  Labs: Lab Results  Component Value Date   HGBA1C 6.0 (H) 05/07/2021   HGBA1C 5.8 03/17/2021   HGBA1C 6.1 09/18/2019   REPTSTATUS 05/13/2021 FINAL 05/08/2021   GRAMSTAIN  05/08/2021    FEW WBC PRESENT, PREDOMINANTLY PMN FEW GRAM POSITIVE COCCI    CULT  05/08/2021    MODERATE STAPHYLOCOCCUS AUREUS NO ANAEROBES ISOLATED Performed at [facility], [place] [address], [place], [place] [address]    LABORGA STAPHYLOCOCCUS AUREUS 05/08/2021     Lab Results  Component Value Date   ALBUMIN 3.3 (L) 05/14/2021   ALBUMIN 2.9 (L) 05/11/2021   ALBUMIN 4.3 05/06/2021    Lab Results  Component Value Date   MG 2.8 (H) 09/26/2016   MG 2.8 (H) 09/25/2016   MG 2.8 (H) 09/24/2016   Lab Results  Component Value Date   VD25OH 6.8 (L) 09/14/2016    No results found for:  PREALBUMIN CBC EXTENDED Latest Ref Rng & Units 05/14/2021 05/12/2021 05/11/2021  WBC 4.0 - 10.5 K/uL 11.5(H) 11.1(H) 10.0  RBC 4.22 - 5.81 MIL/uL 4.39 4.79 4.18(L)  HGB 13.0 - 17.0 g/dL 11.9(L) 13.8 11.3(L)  HCT 39.0 - 52.0 % 36.6(L) 40.1 35.0(L)  PLT 150 - 400 K/uL 346 347 362  NEUTROABS 1.7 - 7.7 K/uL - - -  LYMPHSABS 0.7 - 4.0 K/uL - - -     Body mass index is 43.54 kg/m.  Orders:  No orders of the defined types were placed in this encounter.  No orders of the defined types were placed in this encounter.    Procedures: No procedures performed  Clinical Data: No additional findings.  ROS:  All other systems negative, except as noted in the HPI. Review of Systems  Objective: Vital Signs: Ht 6\' 1"  (1.854 m)    Wt (!) 330 lb (149.7 kg)    BMI 43.54 kg/m   Specialty Comments:  No specialty comments available.  PMFS History: Patient Active Problem List   Diagnosis Date Noted   Diabetic foot infection ([place])    Wound infection 05/07/2021   MSSA bacteremia 05/07/2021   Osteomyelitis ([place]) 05/07/2021   Abscess 05/07/2021   Sepsis due to cellulitis ([place]) 05/06/2021   Poor compliance  with medication 03/17/2021   Fluid overload, unspecified 91/47/8295   Complication of vascular dialysis catheter 03/23/2020   Other disorders of phosphorus metabolism 01/28/2020   Hypertension 01/08/2020   Hypocalcemia 10/29/2019   ESRD on dialysis ([place]) 11/16/2017   Pruritus, unspecified 04/04/2017   Unspecified protein-calorie malnutrition ([place]) 11/11/2016   Headache, unspecified 10/29/2016   Encounter for immunization 10/19/2016   Iron deficiency anemia, unspecified 10/10/2016   Anemia in chronic kidney disease 09/30/2016   Coagulation defect, unspecified ([place]) 09/30/2016   Pneumonia due to Pseudomonas ([place]) 09/30/2016   Secondary hyperparathyroidism of renal origin ([place]) 09/30/2016   Hypothyroidism 10/29/2009   Type 2 diabetes mellitus ([place]) 04/14/2009   Hyperlipidemia  04/14/2009   OBESITY, MORBID 04/14/2009   Essential hypertension, benign 04/14/2009   Past Medical History:  Diagnosis Date   Acute hypoxemic respiratory failure ([place]) 09/11/2016   Anemia    Chronic kidney disease    ARF on CRF Dialysis T/TH/Sa   Diabetes mellitus    Type II   End stage renal disease on dialysis ([place])    [place]    GERD (gastroesophageal reflux disease)    diet controlled   HLD (hyperlipidemia)    Hypertension    Hypothyroidism    Morbid obesity ([place])    Sacral wound    resolved   Thyroid disease     Family History  Problem Relation Age of Onset   Heart disease Mother    Hypertension Mother     Past Surgical History:  Procedure Laterality Date   AMPUTATION Left 05/08/2021   Procedure: AMPUTATION GREAT TOE;  Surgeon: Newt Minion, MD;  Location: [place];  Service: Orthopedics;  Laterality: Left;   AV FISTULA PLACEMENT Left 09/26/2016   Procedure: LEFT UPPER ARM ARTERIOVENOUS (AV) FISTULA CREATION;  Surgeon: Elam Dutch, MD;  Location: [place];  Service: Vascular;  Laterality: Left;   AV FISTULA PLACEMENT Left 12/02/2016   Procedure: INSERTION OF ARTERIOVENOUS GORE-TEX GRAFT LEFT UPPER  ARM USING A 4-7MM BY 45CM GRAFT ;  Surgeon: Rosetta Posner, MD;  Location: [place];  Service: Vascular;  Laterality: Left;   AV FISTULA PLACEMENT Right 07/03/2020   Procedure: INSERTION OF ARTERIOVENOUS (AV) GORE-TEX GRAFT RIGHT UPPER ARM;  Surgeon: Marty Heck, MD;  Location: [place];  Service: Vascular;  Laterality: Right;   [place] Right 05/06/2020   Procedure: FIRST STAGE RIGHT [place];  Surgeon: Serafina Mitchell, MD;  Location: [place];  Service: Vascular;  Laterality: Right;   EXCHANGE OF A DIALYSIS CATHETER Right 10/08/2016   Procedure: EXCHANGE OF A DIALYSIS CATHETER-RIGHT INTERNAL JUGULAR;  Surgeon: Conrad [place], MD;  Location: [place];  Service: Vascular;  Laterality: Right;   INSERTION OF DIALYSIS CATHETER Right  09/26/2016   Procedure: INSERTION OF DIALYSIS CATHETER - Right Internal Jugular Placement;  Surgeon: Elam Dutch, MD;  Location: [place];  Service: Vascular;  Laterality: Right;   INSERTION OF DIALYSIS CATHETER Left 03/22/2018   Procedure: INSERTION OF DIALYSIS CATHETER;  Surgeon: Marty Heck, MD;  Location: [place];  Service: Vascular;  Laterality: Left;   IR FLUORO GUIDE CV LINE RIGHT  03/26/2021   IR FLUORO GUIDE CV LINE RIGHT  05/13/2021   IR REMOVAL TUN CV CATH W/O FL  05/11/2021   IR US GUIDE VASC ACCESS RIGHT  03/26/2021   IR US GUIDE VASC ACCESS RIGHT  05/13/2021   REVISON OF ARTERIOVENOUS FISTULA Left 03/22/2018   Procedure: LEFT UPPER EXTREMITY ARTERIOVENOUS  REVISION WITH GORE-TEX GRAFT.;  Surgeon: Marty Heck, MD;  Location: [place];  Service: Vascular;  Laterality: Left;   TEE WITHOUT CARDIOVERSION N/A 05/12/2021   Procedure: TRANSESOPHAGEAL ECHOCARDIOGRAM (TEE);  Surgeon: Werner Lean, MD;  Location: [facility] ENDOSCOPY;  Service: Cardiovascular;  Laterality: N/A;   THROMBECTOMY Left 08/26/2019   thrombectomy of LUA loop AVG   THROMBECTOMY AND REVISION OF ARTERIOVENTOUS (AV) GORETEX  GRAFT Right 07/27/2020   Procedure: INSERTION OF RIGHT ARM LOOP GRAFT WITH EXCISION OF RIGHT ARM BRACHIAL AXILLARY GRAFT;  Surgeon: Marty Heck, MD;  Location: [place];  Service: Vascular;  Laterality: Right;   VISCERAL ANGIOGRAPHY Right 06/17/2020   Procedure: CENTRAL VENO;  Surgeon: Cherre Robins, MD;  Location: [place] CV LAB;  Service: Cardiovascular;  Laterality: Right;   Social History   Occupational History   Not on file  Tobacco Use   Smoking status: Never   Smokeless tobacco: Never  Vaping Use   Vaping Use: Never used  Substance and Sexual Activity   Alcohol use: No   Drug use: Yes    Frequency: 2.0 times per week    Types: Marijuana    Comment: Last time 07/23/20   Sexual activity: Not on file

## 2021-06-07 ENCOUNTER — Other Ambulatory Visit: Payer: Self-pay

## 2021-06-07 ENCOUNTER — Other Ambulatory Visit: Payer: Self-pay | Admitting: *Deleted

## 2021-06-07 DIAGNOSIS — Z604 Social exclusion and rejection: Secondary | ICD-10-CM

## 2021-06-07 NOTE — Patient Instructions (Signed)
Visit Information  Paul [name] was given information about Medicaid Managed Care team care coordination services as a part of their [place] Medicaid benefit. Paul [name] verbally consented to engagement with the [facility] Managed Care team.   If you are experiencing a medical emergency, please call 911 or report to your local emergency department or urgent care.   If you have a non-emergency medical problem during routine business hours, please contact your provider's office and ask to speak with a nurse.   For questions related to your [facility], please call: [phone] or visit the homepage here: [email or web address]  If you would like to schedule transportation through your [facility], please call the following number at least 2 days in advance of your appointment: [phone].   Call the [place] at [phone], at any time, 24 hours a day, 7 days a week. If you are in danger or need immediate medical attention call 911.  If you would like help to quit smoking, call 1-800-QUIT-NOW (819)224-0236) OR Espaol: 1-855-Djelo-Ya (2-297-989-2119) o para ms informacin haga clic aqu or Text READY to 200-400 to register via text  Paul [name] - following are the goals we discussed in your visit today:   Goals Addressed   None     Please see education materials related to diabetes provided by MyChart link.  Patient has access to MyChart and can view provided education  Telephone follow up appointment with Managed Medicaid care management team member scheduled for:07/07/21 @ 10:30am  Paul Joiner RN, BSN [place] RN Care Coordinator   Following is a copy of your plan of care:  Care Plan : RN Care Manager Plan of Care  Updates made by Paul Montane, RN since 06/07/2021 12:00 AM     Problem: Health  management needs related to DMII and recent great toe amputation      Long-Range Goal: Development of Plan of Care to address health management needs related to DMII and recent great toe amputation   Start Date: 05/21/2021  Expected End Date: 08/19/2021  Priority: High  Note:   Current Barriers:  Chronic Disease Management support and education needs related to DMII and great toe amputation  Paul [name] has not received prescribed glucometer or shower chair. He has confirmation on shower chair shipping and will track this package today. He will transfer glucometer to [name] which is closer to his sister's home. Paul [name] is interested in a kidney support group. He is aware of his upcoming appointments and has transportation.  RNCM Clinical Goal(s):  Patient will verbalize understanding of plan for management of DMII and great toe amputation as evidenced by patient verbalization of self monitoring activity attend all scheduled medical appointments: 1/17 with Dr. Baxter Flattery, 1/23 for Procedure with Dr. Donzetta Matters and 2/9 with Dr. Sharol Given as evidenced by provider documentation in EMR        demonstrate improved adherence to prescribed treatment plan for DMII as evidenced by provider documentation in EMR work with pharmacist to address understanding of medications related to DMII as evidenced by review of EMR and patient or pharmacist report    through collaboration with RN Care manager, provider, and care team.   Interventions: Inter-disciplinary care team collaboration (see longitudinal plan of care) Evaluation of current treatment plan related to  self management and patient's adherence to plan as established by provider Provided patient with information for eye exam referral-[facility]  [phone] Care Guide referral placed for information for Kidney Support Groups   Diabetes:  (Status: Goal on Track (progressing): YES.) Long Term Goal   Lab Results  Component Value Date   HGBA1C 6.0  (H) 05/07/2021  Assessed patient's understanding of A1c goal: <7% Reviewed medications with patient and discussed importance of medication adherence;        Discussed plans with patient for ongoing care management follow up and provided patient with direct contact information for care management team;      Provided patient with written educational materials related to hypo and hyperglycemia and importance of correct treatment;       Reviewed scheduled/upcoming provider appointments including: 1/17 for hospital follow up, 1/23 for procedure with Dr. Donzetta Matters and 2/9 with Dr. Sharol Given;         Review of patient status, including review of consultants reports, relevant laboratory and other test results, and medications completed;       Advised patient to discuss podiatry referral with provider;      Assessed social determinant of health barriers;        Advised patient to pick up glucometer and supplies and start checking BS twice daily  Great toe amputation  (Status: Goal on Track (progressing): YES.) Long Term Goal  Discussed plans with patient for ongoing care management follow up and provided patient with direct contact information for care management team Reviewed medications with patient and discussed importance of compliance; Reviewed scheduled/upcoming provider appointments including 1/17 for hospital follow up, 1/23 for procedure with Dr. Donzetta Matters and 2/9 with Dr. Sharol Given; Discussed plans with patient for ongoing care management follow up and provided patient with direct contact information for care management team; Assessed social determinant of health barriers;   Patient Goals/Self-Care Activities: Take medications as prescribed   Attend all scheduled provider appointments Call pharmacy for medication refills 3-7 days in advance of running out of medications Perform all self care activities independently  Call provider office for new concerns or questions  schedule appointment with eye doctor check  blood sugar at prescribed times: 4 times daily and when you have symptoms of low or high blood sugar enter blood sugar readings and medication or insulin into daily log take the blood sugar log to all doctor visits fill half of plate with vegetables manage portion size prepare main meal at home 3 to 5 days each week

## 2021-06-08 ENCOUNTER — Other Ambulatory Visit: Payer: Self-pay

## 2021-06-08 ENCOUNTER — Encounter: Payer: Self-pay | Admitting: Internal Medicine

## 2021-06-08 ENCOUNTER — Ambulatory Visit (INDEPENDENT_AMBULATORY_CARE_PROVIDER_SITE_OTHER): Payer: Medicaid Other | Admitting: Internal Medicine

## 2021-06-08 ENCOUNTER — Telehealth: Payer: Self-pay

## 2021-06-08 VITALS — BP 113/74 | HR 112 | Temp 96.6°F | Wt 325.0 lb

## 2021-06-08 DIAGNOSIS — T8740 Infection of amputation stump, unspecified extremity: Secondary | ICD-10-CM | POA: Diagnosis present

## 2021-06-08 DIAGNOSIS — R7881 Bacteremia: Secondary | ICD-10-CM | POA: Diagnosis not present

## 2021-06-08 DIAGNOSIS — L089 Local infection of the skin and subcutaneous tissue, unspecified: Secondary | ICD-10-CM

## 2021-06-08 DIAGNOSIS — B9561 Methicillin susceptible Staphylococcus aureus infection as the cause of diseases classified elsewhere: Secondary | ICD-10-CM | POA: Diagnosis not present

## 2021-06-08 NOTE — Progress Notes (Signed)
RFV: first visit for osteomyelitis  Patient ID: Paul [name], male   DOB: [birth year], 31 y.o.   MRN: [id number]  HPI 31yo M with first ray amputation placed on abtx. Where he was treat for 6 wk of cefazolin via HD through 06/19/21 for MSSA bacteremia. Cleared on 05/08/21. He is here for follow up. Doing well overall. 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 Previous surgical cultures with enterococcus, bacteroides, and MSSA with only MSSA found in blood culture. Enterococcus and bacteroides appear localized and infection resolved with amputation. Will need 6 weeks of Cefazolin for MSSA bacteremia to be completed with dialysis. Appears safe to replace dialysis catheter as planned with cultures being negative.Remaining medical and supportive care per primary team.      Outpatient Encounter Medications as of 06/08/2021  Medication Sig   Accu-Chek FastClix Lancets MISC USE AS DIRECTED TWICE DAILY   acetaminophen (TYLENOL) 500 MG tablet Take 1,000 mg by mouth every 6 (six) hours as needed (for pain.).   [START ON 06/14/2021] apixaban (ELIQUIS) 5 MG TABS tablet Take 1 tablet (5 mg total) by mouth 2 (two) times daily. START TAKING ONLY AFTER STARTER PACK HAS BEEN COMPLETED   Blood Glucose Monitoring Suppl (ACCU-CHEK GUIDE ME) w/Device KIT 1 kit by Does not apply route daily.   calcium carbonate (TUMS - DOSED IN MG ELEMENTAL CALCIUM) 500 MG chewable tablet Chew 1,000-1,500 mg by mouth daily as needed for indigestion or heartburn.   ceFAZolin (ANCEF) 2-4 GM/100ML-% IVPB Inject 100 mLs (2 g total) into the vein every Tuesday, Thursday, and Saturday at 6 PM.   cinacalcet (SENSIPAR) 60 MG tablet Take 60 mg by mouth every other day.   ferric citrate (AURYXIA) 1 GM 210 MG(Fe) tablet Take 420-840 mg by mouth See admin instructions. Take 3-4 tablets (630 mg-840 mg) by mouth with each meal & take 1-2 tablets (210-420 mg) by mouth with each snack.   gabapentin (NEURONTIN) 100 MG  capsule Take 1 capsule (100 mg total) by mouth 3 (three) times daily.   glucose blood (ACCU-CHEK GUIDE) test strip 1 each by Other route 2 (two) times daily. Use as instructed   heparin 1000 unit/mL SOLN injection Heparin Sodium (Porcine) 1,000 Units/mL Systemic   insulin glargine (LANTUS) 100 UNIT/ML injection Inject 0.08 mLs (8 Units total) into the skin daily.   levothyroxine (SYNTHROID) 75 MCG tablet TAKE 1 TABLET BY MOUTH ONCE DAILY BEFORE BREAKFAST   midodrine (PROAMATINE) 5 MG tablet Take 1 tablet (5 mg total) by mouth 3 (three) times daily with meals.   multivitamin (RENA-VIT) TABS tablet Take 1 tablet by mouth at bedtime.   polyethylene glycol (MIRALAX / GLYCOLAX) 17 g packet Take 17 g by mouth daily as needed.   rosuvastatin (CRESTOR) 20 MG tablet Take 2 tablets by mouth once daily (Patient taking differently: Take 20 mg by mouth in the morning and at bedtime.)   Syringe, Disposable, (B-D SYRINGE SLIP TIP 30CC) 30 ML MISC 1 Syringe by Does not apply route daily.   oxyCODONE-acetaminophen (PERCOCET) 7.5-325 MG tablet Take 1 tablet by mouth every 6 (six) hours as needed for severe pain or moderate pain. (Patient not taking: Reported on 06/08/2021)   No facility-administered encounter medications on file as of 06/08/2021.     Patient Active Problem List   Diagnosis Date Noted   Diabetic foot infection ([place])    Wound infection 05/07/2021   MSSA bacteremia 05/07/2021   Osteomyelitis ([place]) 05/07/2021   Abscess 05/07/2021  Sepsis due to cellulitis ([place]) 05/06/2021   Poor compliance with medication 03/17/2021   Fluid overload, unspecified 69/62/9528   Complication of vascular dialysis catheter 03/23/2020   Other disorders of phosphorus metabolism 01/28/2020   Hypertension 01/08/2020   Hypocalcemia 10/29/2019   ESRD on dialysis ([place]) 11/16/2017   Pruritus, unspecified 04/04/2017   Unspecified protein-calorie malnutrition ([place]) 11/11/2016   Headache, unspecified 10/29/2016   Encounter  for immunization 10/19/2016   Iron deficiency anemia, unspecified 10/10/2016   Anemia in chronic kidney disease 09/30/2016   Coagulation defect, unspecified ([place]) 09/30/2016   Pneumonia due to Pseudomonas ([place]) 09/30/2016   Secondary hyperparathyroidism of renal origin ([place]) 09/30/2016   Hypothyroidism 10/29/2009   Type 2 diabetes mellitus ([place]) 04/14/2009   Hyperlipidemia 04/14/2009   OBESITY, MORBID 04/14/2009   Essential hypertension, benign 04/14/2009     Health Maintenance Due  Topic Date Due   OPHTHALMOLOGY EXAM  Never done   Hepatitis C Screening  Never done   FOOT EXAM  11/17/2018   COVID-19 Vaccine (3 - Booster for Pfizer series) 10/24/2019     Review of Systems 12 point ros is negative. No fever or chills Physical Exam   BP 113/74   Pulse (!) 112   Temp (!) 96.6 F (35.9 C) (Temporal)   Wt (!) 325 lb (147.4 kg)   BMI 42.88 kg/m    A x o by3 in NAD Skin = no rash c/d/I Ext= foot wrapped. Healing no drainage  CBC Lab Results  Component Value Date   WBC 11.5 (H) 05/14/2021   RBC 4.39 05/14/2021   HGB 11.9 (L) 05/14/2021   HCT 36.6 (L) 05/14/2021   PLT 346 05/14/2021   MCV 83.4 05/14/2021   MCH 27.1 05/14/2021   MCHC 32.5 05/14/2021   RDW 17.5 (H) 05/14/2021   LYMPHSABS 2.4 05/06/2021   MONOABS 1.7 (H) 05/06/2021   EOSABS 0.1 05/06/2021    BMET Lab Results  Component Value Date   NA 134 (L) 05/14/2021   K 4.3 05/14/2021   CL 99 05/14/2021   CO2 21 (L) 05/14/2021   GLUCOSE 114 (H) 05/14/2021   BUN 45 (H) 05/14/2021   CREATININE 12.74 (H) 05/14/2021   CALCIUM 9.3 05/14/2021   GFRNONAA 5 (L) 05/14/2021   GFRAA 6 (L) 06/14/2019      Assessment and Plan MSSA bacteremia with dfu/wound infection s/p ray amputation Moved back home to [place], to finish out his course of treatment at [facility] kidney - 11 days. To get labs, reinforced on 1/28

## 2021-06-08 NOTE — Telephone Encounter (Signed)
I spoke Paul [name] with [facility] in [place] to confirm they have the correct IV antibiotic end date for 06/19/21 per Dr. Baxter Flattery. Per Paul [name] they have the correct end date for IV antibiotics.  .Kiauna Zywicki Tilda Burrow

## 2021-06-09 NOTE — Telephone Encounter (Signed)
We added this patient to our OPAT list; thank you for the heads up.

## 2021-06-11 ENCOUNTER — Telehealth: Payer: Self-pay | Admitting: *Deleted

## 2021-06-11 DIAGNOSIS — E1122 Type 2 diabetes mellitus with diabetic chronic kidney disease: Secondary | ICD-10-CM

## 2021-06-11 DIAGNOSIS — Z794 Long term (current) use of insulin: Secondary | ICD-10-CM

## 2021-06-11 MED ORDER — ACCU-CHEK SOFTCLIX LANCETS MISC: 12 refills | Status: DC

## 2021-06-11 NOTE — Telephone Encounter (Signed)
Received fax from pharmacy requesting to resend for Softclix Lancets for this pt. Paul [name], CMA

## 2021-06-14 ENCOUNTER — Other Ambulatory Visit: Payer: Self-pay

## 2021-06-14 ENCOUNTER — Ambulatory Visit (HOSPITAL_COMMUNITY)
Admission: RE | Admit: 2021-06-14 | Discharge: 2021-06-14 | Disposition: A | Discharge status: Home / Self Care | Payer: Medicaid Other | Attending: Vascular Surgery | Admitting: Vascular Surgery

## 2021-06-14 ENCOUNTER — Encounter (HOSPITAL_COMMUNITY)
Admission: RE | Disposition: A | Discharge status: Home / Self Care | Payer: Self-pay | Source: Home / Self Care | Attending: Vascular Surgery

## 2021-06-14 ENCOUNTER — Encounter (HOSPITAL_COMMUNITY): Payer: Self-pay | Admitting: Vascular Surgery

## 2021-06-14 DIAGNOSIS — E1122 Type 2 diabetes mellitus with diabetic chronic kidney disease: Secondary | ICD-10-CM | POA: Diagnosis not present

## 2021-06-14 DIAGNOSIS — Z794 Long term (current) use of insulin: Secondary | ICD-10-CM | POA: Insufficient documentation

## 2021-06-14 DIAGNOSIS — Z86718 Personal history of other venous thrombosis and embolism: Secondary | ICD-10-CM | POA: Insufficient documentation

## 2021-06-14 DIAGNOSIS — Z7901 Long term (current) use of anticoagulants: Secondary | ICD-10-CM | POA: Insufficient documentation

## 2021-06-14 DIAGNOSIS — Z992 Dependence on renal dialysis: Secondary | ICD-10-CM | POA: Insufficient documentation

## 2021-06-14 DIAGNOSIS — I120 Hypertensive chronic kidney disease with stage 5 chronic kidney disease or end stage renal disease: Secondary | ICD-10-CM | POA: Diagnosis present

## 2021-06-14 DIAGNOSIS — N186 End stage renal disease: Secondary | ICD-10-CM | POA: Insufficient documentation

## 2021-06-14 HISTORY — PX: UPPER EXTREMITY VENOGRAPHY: CATH118272

## 2021-06-14 LAB — POCT I-STAT, CHEM 8
BUN: 41 mg/dL — ABNORMAL HIGH (ref 6–20)
Calcium, Ion: 1.07 mmol/L — ABNORMAL LOW (ref 1.15–1.40)
Chloride: 103 mmol/L (ref 98–111)
Creatinine, Ser: 16.1 mg/dL — ABNORMAL HIGH (ref 0.61–1.24)
Glucose, Bld: 110 mg/dL — ABNORMAL HIGH (ref 70–99)
HCT: 42 % (ref 39.0–52.0)
Hemoglobin: 14.3 g/dL (ref 13.0–17.0)
Potassium: 4.8 mmol/L (ref 3.5–5.1)
Sodium: 140 mmol/L (ref 135–145)
TCO2: 27 mmol/L (ref 22–32)

## 2021-06-14 SURGERY — UPPER EXTREMITY VENOGRAPHY
Anesthesia: LOCAL | Laterality: Bilateral

## 2021-06-14 MED ORDER — SODIUM CHLORIDE 0.9 % IV SOLN: 250.0000 mL | INTRAVENOUS | Status: DC | PRN

## 2021-06-14 MED ORDER — IODIXANOL 320 MG/ML IV SOLN
INTRAVENOUS | Status: DC | PRN
  Administered 2021-06-14: 50 mL via INTRAVENOUS

## 2021-06-14 MED ORDER — SODIUM CHLORIDE 0.9% FLUSH: 3.0000 mL | INTRAVENOUS | Status: DC | PRN

## 2021-06-14 MED ORDER — SODIUM CHLORIDE 0.9% FLUSH: 3.0000 mL | Freq: Two times a day (BID) | INTRAVENOUS | Status: DC

## 2021-06-14 SURGICAL SUPPLY — 2 items
STOPCOCK MORSE 400PSI 3WAY (MISCELLANEOUS) ×1 IMPLANT
TUBING CIL FLEX 10 FLL-RA (TUBING) ×1 IMPLANT

## 2021-06-14 NOTE — Interval H&P Note (Signed)
History and Physical Interval Note:  06/14/2021 7:25 AM  Paul [name]  has presented today for surgery, with the diagnosis of end stage renal.  The various methods of treatment have been discussed with the patient and family. After consideration of risks, benefits and other options for treatment, the patient has consented to  Procedure(s): UPPER EXTREMITY VENOGRAPHY (Bilateral) as a surgical intervention.  The patient's history has been reviewed, patient examined, no change in status, stable for surgery.  I have reviewed the patient's chart and labs.  Questions were answered to the patient's satisfaction.     [name]

## 2021-06-14 NOTE — Op Note (Signed)
° ° °  Patient name: Paul [name] MRN: [id number] DOB: 04/18/1991 Sex: male  06/14/2021 Pre-operative Diagnosis: esrd Post-operative diagnosis:  Same Surgeon:  Erlene Quan C. Donzetta Matters, MD Procedure Performed: Bilateral upper extremity venography  Indications: 31 year old male with failed bilateral upper extremity access procedures in the past.  He does appear to have suitable cephalic and basilic vein on the right and basilic vein on the left for fistula creation but given his recent issues with graft failure bilaterally and also SVC thrombosis we are planning for bilateral upper extremity venography to plan future access surgery.  Findings: On the right side he appears to be open centrally in the innominate subclavian veins.  There is an axillary vein stent which also appears patent.  There is a large basilic vein with 1 branch going through the stent on branch around the stent.  The cephalic vein is also sizable but does not begin until halfway up the upper arm cephalic arch is patent.  On the left side centrally is patent with patent innominate and subclavian veins.  There are stents in his axillary veins these do not appear to fill at all likely from previous loop graft.  There are paired brachial veins which are patent throughout.  No identifiable cephalic or basilic veins.  Ultrasound was used to identify what appeared to be patent artery and axillary vein by ultrasound on the left side on the right side there is a cephalic vein at the starts of the upper arm and is crossed by the graft likely not amenable for fistula creation.  We will plan for left arm AV fistula or graft on a nondialysis day in the near future.   Procedure:  The patient was identified in the holding area and taken to room 8.  The patient was then placed supine on the table and prepped and draped in the usual sterile fashion.  A time out was called.  Preoperatively IVs were placed both upper extremities.  We performed bilateral upper  extremity venography.  Ultrasound was used to identify both arms.  With the above findings we will plan for left arm AV fistula versus graft in the near future.  He tolerated procedure without any complication.  Contrast: 50cc   Briana Farner C. Donzetta Matters, MD [facility] Office: [phone] Pager: [phone]

## 2021-06-15 ENCOUNTER — Other Ambulatory Visit: Payer: Self-pay

## 2021-06-16 ENCOUNTER — Other Ambulatory Visit: Payer: Self-pay | Admitting: Student

## 2021-06-16 DIAGNOSIS — E039 Hypothyroidism, unspecified: Secondary | ICD-10-CM

## 2021-06-18 ENCOUNTER — Other Ambulatory Visit: Payer: Self-pay

## 2021-06-22 DIAGNOSIS — Z992 Dependence on renal dialysis: Secondary | ICD-10-CM | POA: Diagnosis not present

## 2021-06-22 DIAGNOSIS — N186 End stage renal disease: Secondary | ICD-10-CM | POA: Diagnosis not present

## 2021-06-22 DIAGNOSIS — E1129 Type 2 diabetes mellitus with other diabetic kidney complication: Secondary | ICD-10-CM | POA: Diagnosis not present

## 2021-06-23 DIAGNOSIS — L089 Local infection of the skin and subcutaneous tissue, unspecified: Secondary | ICD-10-CM | POA: Diagnosis not present

## 2021-06-23 DIAGNOSIS — E11628 Type 2 diabetes mellitus with other skin complications: Secondary | ICD-10-CM | POA: Diagnosis not present

## 2021-06-24 NOTE — Telephone Encounter (Signed)
Called and scheduled patient to be seen 07/06/21

## 2021-06-28 ENCOUNTER — Other Ambulatory Visit: Payer: Self-pay | Admitting: Orthopedic Surgery

## 2021-06-28 ENCOUNTER — Telehealth: Payer: Self-pay | Admitting: Orthopedic Surgery

## 2021-06-28 MED ORDER — OXYCODONE-ACETAMINOPHEN 7.5-325 MG PO TABS: 1.0000 | ORAL_TABLET | Freq: Four times a day (QID) | ORAL | 0 refills | Status: DC | PRN

## 2021-06-28 NOTE — Telephone Encounter (Signed)
Patient called. He would like a refill on oxycodone. His call back number is [phone]

## 2021-06-28 NOTE — Telephone Encounter (Signed)
Pt is s/p left great toe amputation. Oxycodone last filled 05/12/21 #30, q6h prn

## 2021-06-30 ENCOUNTER — Encounter (HOSPITAL_COMMUNITY): Payer: Self-pay | Admitting: Vascular Surgery

## 2021-06-30 ENCOUNTER — Other Ambulatory Visit: Payer: Self-pay

## 2021-06-30 NOTE — Progress Notes (Signed)
Pt is to arrive at [facility] Entrance "A" at 6:40am.  PCP - Dr. Owens Shark, M.  Cardiologist - Denies  EP- Denies  Endocrine- Denies  Pulm- Denies  Chest x-ray - Denies  EKG - 05/06/21 (E)  Stress Test - Denies  ECHO - 05/09/21 (E)  Cardiac Cath - Denies  AICD-na PM-na LOOP-na  Nerve Stimulator- Denies  Dialysis- T-TH-Sat- pt states he had a full session on Tues 2/7  Sleep Study - Denies CPAP - Denies  LABS- 07/02/21: I-Stat 8  ASA- Denies Eliquis- LD- 2/7  ERAS- No  HA1C- 05/07/21 (E): 6.0 Fasting Blood Sugar - 90-130 Checks Blood Sugar ___2__ times a day  Anesthesia- Yes- med/cardiac history  Pt denies having chest pain, sob, or fever during the pre-op phone call. All instructions explained to the pt, with a verbal understanding of the material including: as of today,  stop taking all Aspirin (unless instructed by your doctor) and Other Aspirin containing products, Vitamins, Fish oils, and Herbal medications. Also stop all NSAIDS i.e. Advil, Ibuprofen, Motrin, Aleve, Anaprox, Naproxen, BC, Goody Powders, and all Supplements.   WHAT DO I DO ABOUT MY DIABETES MEDICATION?  THE NIGHT BEFORE SURGERY, take ____5_______ units of ______Lantus_____insulin.       THE MORNING OF SURGERY, take ______5_______ units of ____Lantus______insulin.  If your CBG is greater than 220 mg/dL, inform the staff upon arrival to Short Stay.   How do I manage my blood sugar before surgery? Check your blood sugar at least 4 times a day, starting 2 days before surgery, to make sure that the level is not too high or low. Check your blood sugar the morning of your surgery when you wake up and every 2 hours until you get to the Short Stay unit. If your blood sugar is less than 70 mg/dL, you will need to treat for low blood sugar: Do not take insulin. Treat a low blood sugar (less than 70 mg/dL) with  cup of clear juice (cranberry or apple), 4 glucose tablets, OR glucose  gel. Recheck blood sugar in 15 minutes after treatment (to make sure it is greater than 70 mg/dL). If your blood sugar is not greater than 70 mg/dL on recheck, call [phone]  for further instructions. Report your blood sugar to the short stay nurse when you get to Short Stay.  Reviewed and Endorsed by [facility] Patient Education Committee, August 2015   Pt also instructed to wear a mask and social distance if he goes out. The opportunity to ask questions was provided.    Coronavirus Screening  Have you experienced the following symptoms:  Cough yes/no: No Fever (>100.37F)  yes/no: No Runny nose yes/no: No Sore throat yes/no: No Difficulty breathing/shortness of breath  yes/no: No  Have you or a family member traveled in the last 14 days and where? yes/no: No   If the patient indicates "YES" to the above questions, their PAT will be rescheduled to limit the exposure to others and, the surgeon will be notified. THE PATIENT WILL NEED TO BE ASYMPTOMATIC FOR 14 DAYS.   If the patient is not experiencing any of these symptoms, the PAT nurse will instruct them to NOT bring anyone with them to their appointment since they may have these symptoms or traveled as well.   Please remind your patients and families that hospital visitation restrictions are in effect and the importance of the restrictions.

## 2021-07-01 ENCOUNTER — Encounter: Payer: Medicaid Other | Admitting: Orthopedic Surgery

## 2021-07-01 MED ORDER — CEFAZOLIN IN SODIUM CHLORIDE 3-0.9 GM/100ML-% IV SOLN
3.0000 g | INTRAVENOUS | Status: DC
  Filled 2021-07-01 (×2): qty 100

## 2021-07-02 ENCOUNTER — Other Ambulatory Visit: Payer: Self-pay

## 2021-07-02 ENCOUNTER — Ambulatory Visit (HOSPITAL_BASED_OUTPATIENT_CLINIC_OR_DEPARTMENT_OTHER): Payer: Medicaid Other | Admitting: Physician Assistant

## 2021-07-02 ENCOUNTER — Telehealth: Payer: Self-pay

## 2021-07-02 ENCOUNTER — Encounter (HOSPITAL_COMMUNITY): Payer: Self-pay | Admitting: Vascular Surgery

## 2021-07-02 ENCOUNTER — Encounter (HOSPITAL_COMMUNITY)
Admission: RE | Disposition: A | Discharge status: Home / Self Care | Payer: Self-pay | Source: Home / Self Care | Attending: Vascular Surgery

## 2021-07-02 ENCOUNTER — Ambulatory Visit (HOSPITAL_COMMUNITY): Payer: Medicaid Other | Admitting: Physician Assistant

## 2021-07-02 ENCOUNTER — Ambulatory Visit (HOSPITAL_COMMUNITY)
Admission: RE | Admit: 2021-07-02 | Discharge: 2021-07-02 | Disposition: A | Discharge status: Home / Self Care | Payer: Medicaid Other | Attending: Vascular Surgery | Admitting: Vascular Surgery

## 2021-07-02 DIAGNOSIS — I120 Hypertensive chronic kidney disease with stage 5 chronic kidney disease or end stage renal disease: Secondary | ICD-10-CM | POA: Diagnosis not present

## 2021-07-02 DIAGNOSIS — Z992 Dependence on renal dialysis: Secondary | ICD-10-CM

## 2021-07-02 DIAGNOSIS — N186 End stage renal disease: Secondary | ICD-10-CM | POA: Insufficient documentation

## 2021-07-02 DIAGNOSIS — E1122 Type 2 diabetes mellitus with diabetic chronic kidney disease: Secondary | ICD-10-CM

## 2021-07-02 DIAGNOSIS — E039 Hypothyroidism, unspecified: Secondary | ICD-10-CM | POA: Insufficient documentation

## 2021-07-02 HISTORY — PX: AV FISTULA PLACEMENT: SHX1204

## 2021-07-02 LAB — POCT I-STAT, CHEM 8
BUN: 46 mg/dL — ABNORMAL HIGH (ref 6–20)
Calcium, Ion: 0.94 mmol/L — ABNORMAL LOW (ref 1.15–1.40)
Chloride: 101 mmol/L (ref 98–111)
Creatinine, Ser: 14.6 mg/dL — ABNORMAL HIGH (ref 0.61–1.24)
Glucose, Bld: 125 mg/dL — ABNORMAL HIGH (ref 70–99)
HCT: 48 % (ref 39.0–52.0)
Hemoglobin: 16.3 g/dL (ref 13.0–17.0)
Potassium: 4.4 mmol/L (ref 3.5–5.1)
Sodium: 137 mmol/L (ref 135–145)
TCO2: 24 mmol/L (ref 22–32)

## 2021-07-02 LAB — GLUCOSE, CAPILLARY
Glucose-Capillary: 157 mg/dL — ABNORMAL HIGH (ref 70–99)
Glucose-Capillary: 105 mg/dL — ABNORMAL HIGH (ref 70–99)

## 2021-07-02 SURGERY — ARTERIOVENOUS (AV) FISTULA CREATION
Anesthesia: General | Site: Arm Upper | Laterality: Left

## 2021-07-02 MED ORDER — PHENYLEPHRINE 40 MCG/ML (10ML) SYRINGE FOR IV PUSH (FOR BLOOD PRESSURE SUPPORT)
PREFILLED_SYRINGE | INTRAVENOUS | Status: AC
  Filled 2021-07-02: qty 10

## 2021-07-02 MED ORDER — HEPARIN 6000 UNIT IRRIGATION SOLUTION
Status: AC
  Filled 2021-07-02: qty 500

## 2021-07-02 MED ORDER — GLYCOPYRROLATE PF 0.2 MG/ML IJ SOSY
PREFILLED_SYRINGE | INTRAMUSCULAR | Status: AC
  Filled 2021-07-02: qty 1

## 2021-07-02 MED ORDER — MIDAZOLAM HCL 2 MG/2ML IJ SOLN
INTRAMUSCULAR | Status: AC
  Filled 2021-07-02: qty 2

## 2021-07-02 MED ORDER — ALBUMIN HUMAN 5 % IV SOLN
12.5000 g | Freq: Once | INTRAVENOUS | Status: AC
  Administered 2021-07-02: 12.5 g via INTRAVENOUS

## 2021-07-02 MED ORDER — DEXTROSE 5 % IV SOLN
INTRAVENOUS | Status: DC | PRN
  Administered 2021-07-02: 3 g via INTRAVENOUS

## 2021-07-02 MED ORDER — SUCCINYLCHOLINE CHLORIDE 200 MG/10ML IV SOSY
PREFILLED_SYRINGE | INTRAVENOUS | Status: AC
  Filled 2021-07-02: qty 10

## 2021-07-02 MED ORDER — FENTANYL CITRATE (PF) 100 MCG/2ML IJ SOLN
INTRAMUSCULAR | Status: DC | PRN
  Administered 2021-07-02 (×2): 25 ug via INTRAVENOUS

## 2021-07-02 MED ORDER — ONDANSETRON HCL 4 MG/2ML IJ SOLN
INTRAMUSCULAR | Status: DC | PRN
  Administered 2021-07-02: 4 mg via INTRAVENOUS

## 2021-07-02 MED ORDER — SODIUM CHLORIDE 0.9 % IV SOLN
INTRAVENOUS | Status: DC
  Administered 2021-07-02: 10 mL/h via INTRAVENOUS

## 2021-07-02 MED ORDER — MIDAZOLAM HCL 2 MG/2ML IJ SOLN
INTRAMUSCULAR | Status: DC | PRN
Start: 2021-07-02 — End: 2021-07-02
  Administered 2021-07-02: 2 mg via INTRAVENOUS

## 2021-07-02 MED ORDER — PROPOFOL 10 MG/ML IV BOLUS
INTRAVENOUS | Status: AC
  Filled 2021-07-02: qty 20

## 2021-07-02 MED ORDER — CHLORHEXIDINE GLUCONATE 4 % EX LIQD: 60.0000 mL | Freq: Once | CUTANEOUS | Status: DC

## 2021-07-02 MED ORDER — LIDOCAINE 2% (20 MG/ML) 5 ML SYRINGE
INTRAMUSCULAR | Status: AC
  Filled 2021-07-02: qty 5

## 2021-07-02 MED ORDER — ALBUMIN HUMAN 5 % IV SOLN
INTRAVENOUS | Status: AC
  Filled 2021-07-02: qty 250

## 2021-07-02 MED ORDER — EPHEDRINE 5 MG/ML INJ
INTRAVENOUS | Status: AC
  Filled 2021-07-02: qty 5

## 2021-07-02 MED ORDER — PHENYLEPHRINE HCL-NACL 20-0.9 MG/250ML-% IV SOLN
INTRAVENOUS | Status: DC | PRN
Start: 2021-07-02 — End: 2021-07-02
  Administered 2021-07-02: 150 ug/min via INTRAVENOUS

## 2021-07-02 MED ORDER — PROPOFOL 10 MG/ML IV BOLUS
INTRAVENOUS | Status: DC | PRN
  Administered 2021-07-02: 100 mg via INTRAVENOUS
  Administered 2021-07-02 (×2): 50 mg via INTRAVENOUS

## 2021-07-02 MED ORDER — ONDANSETRON HCL 4 MG/2ML IJ SOLN
INTRAMUSCULAR | Status: AC
  Filled 2021-07-02: qty 2

## 2021-07-02 MED ORDER — FENTANYL CITRATE (PF) 100 MCG/2ML IJ SOLN
INTRAMUSCULAR | Status: AC
  Filled 2021-07-02: qty 2

## 2021-07-02 MED ORDER — ORAL CARE MOUTH RINSE: 15.0000 mL | Freq: Once | OROMUCOSAL | Status: AC

## 2021-07-02 MED ORDER — EPHEDRINE SULFATE-NACL 50-0.9 MG/10ML-% IV SOSY
PREFILLED_SYRINGE | INTRAVENOUS | Status: DC | PRN
  Administered 2021-07-02 (×2): 10 mg via INTRAVENOUS
  Administered 2021-07-02: 5 mg via INTRAVENOUS

## 2021-07-02 MED ORDER — FENTANYL CITRATE (PF) 100 MCG/2ML IJ SOLN
25.0000 ug | INTRAMUSCULAR | Status: DC | PRN
  Administered 2021-07-02: 50 ug via INTRAVENOUS

## 2021-07-02 MED ORDER — OXYCODONE-ACETAMINOPHEN 7.5-325 MG PO TABS: 1.0000 | ORAL_TABLET | Freq: Four times a day (QID) | ORAL | 0 refills | Status: DC | PRN

## 2021-07-02 MED ORDER — ACETAMINOPHEN 500 MG PO TABS
1000.0000 mg | ORAL_TABLET | Freq: Once | ORAL | Status: AC
  Administered 2021-07-02: 1000 mg via ORAL
  Filled 2021-07-02: qty 2

## 2021-07-02 MED ORDER — SUCCINYLCHOLINE CHLORIDE 200 MG/10ML IV SOSY
PREFILLED_SYRINGE | INTRAVENOUS | Status: DC | PRN
Start: 2021-07-02 — End: 2021-07-02
  Administered 2021-07-02: 140 mg via INTRAVENOUS

## 2021-07-02 MED ORDER — HEPARIN 6000 UNIT IRRIGATION SOLUTION
Status: DC | PRN
  Administered 2021-07-02: 1

## 2021-07-02 MED ORDER — ALBUMIN HUMAN 5 % IV SOLN: INTRAVENOUS | Status: DC | PRN

## 2021-07-02 MED ORDER — 0.9 % SODIUM CHLORIDE (POUR BTL) OPTIME
TOPICAL | Status: DC | PRN
  Administered 2021-07-02: 1000 mL

## 2021-07-02 MED ORDER — INSULIN ASPART 100 UNIT/ML IJ SOLN: 0.0000 [IU] | INTRAMUSCULAR | Status: DC | PRN

## 2021-07-02 MED ORDER — FENTANYL CITRATE (PF) 250 MCG/5ML IJ SOLN
INTRAMUSCULAR | Status: AC
  Filled 2021-07-02: qty 5

## 2021-07-02 MED ORDER — GLYCOPYRROLATE PF 0.2 MG/ML IJ SOSY
PREFILLED_SYRINGE | INTRAMUSCULAR | Status: DC | PRN
  Administered 2021-07-02: .2 mg via INTRAVENOUS

## 2021-07-02 MED ORDER — LIDOCAINE 2% (20 MG/ML) 5 ML SYRINGE
INTRAMUSCULAR | Status: DC | PRN
Start: 2021-07-02 — End: 2021-07-02
  Administered 2021-07-02: 100 mg via INTRAVENOUS

## 2021-07-02 MED ORDER — PHENYLEPHRINE 40 MCG/ML (10ML) SYRINGE FOR IV PUSH (FOR BLOOD PRESSURE SUPPORT)
PREFILLED_SYRINGE | INTRAVENOUS | Status: DC | PRN
  Administered 2021-07-02: 120 ug via INTRAVENOUS
  Administered 2021-07-02 (×7): 80 ug via INTRAVENOUS

## 2021-07-02 MED ORDER — PROMETHAZINE HCL 25 MG/ML IJ SOLN: 6.2500 mg | INTRAMUSCULAR | Status: DC | PRN

## 2021-07-02 MED ORDER — CHLORHEXIDINE GLUCONATE 0.12 % MT SOLN
15.0000 mL | Freq: Once | OROMUCOSAL | Status: AC
  Administered 2021-07-02: 15 mL via OROMUCOSAL
  Filled 2021-07-02: qty 15

## 2021-07-02 SURGICAL SUPPLY — 28 items
ARMBAND PINK RESTRICT EXTREMIT (MISCELLANEOUS) ×2 IMPLANT
BAG COUNTER SPONGE SURGICOUNT (BAG) ×2 IMPLANT
CANISTER SUCT 3000ML PPV (MISCELLANEOUS) ×2 IMPLANT
CLIP LIGATING EXTRA MED SLVR (CLIP) ×2 IMPLANT
CLIP LIGATING EXTRA SM BLUE (MISCELLANEOUS) ×2 IMPLANT
COVER PROBE W GEL 5X96 (DRAPES) ×1 IMPLANT
DERMABOND ADVANCED (GAUZE/BANDAGES/DRESSINGS) ×1
DERMABOND ADVANCED .7 DNX12 (GAUZE/BANDAGES/DRESSINGS) ×1 IMPLANT
ELECT REM PT RETURN 9FT ADLT (ELECTROSURGICAL) ×2
ELECTRODE REM PT RTRN 9FT ADLT (ELECTROSURGICAL) ×1 IMPLANT
GLOVE SURG ENC MOIS LTX SZ7.5 (GLOVE) ×2 IMPLANT
GOWN STRL REUS W/ TWL LRG LVL3 (GOWN DISPOSABLE) ×2 IMPLANT
GOWN STRL REUS W/ TWL XL LVL3 (GOWN DISPOSABLE) ×1 IMPLANT
GOWN STRL REUS W/TWL LRG LVL3 (GOWN DISPOSABLE) ×2
GOWN STRL REUS W/TWL XL LVL3 (GOWN DISPOSABLE) ×1
KIT BASIN OR (CUSTOM PROCEDURE TRAY) ×2 IMPLANT
KIT TURNOVER KIT B (KITS) ×2 IMPLANT
NS IRRIG 1000ML POUR BTL (IV SOLUTION) ×2 IMPLANT
PACK CV ACCESS (CUSTOM PROCEDURE TRAY) ×2 IMPLANT
PAD ARMBOARD 7.5X6 YLW CONV (MISCELLANEOUS) ×4 IMPLANT
SUT MNCRL AB 4-0 PS2 18 (SUTURE) ×2 IMPLANT
SUT PROLENE 5 0 C 1 24 (SUTURE) ×3 IMPLANT
SUT PROLENE 6 0 BV (SUTURE) ×2 IMPLANT
SUT VIC AB 3-0 SH 27 (SUTURE) ×1
SUT VIC AB 3-0 SH 27X BRD (SUTURE) ×1 IMPLANT
TOWEL GREEN STERILE (TOWEL DISPOSABLE) ×2 IMPLANT
UNDERPAD 30X36 HEAVY ABSORB (UNDERPADS AND DIAPERS) ×2 IMPLANT
WATER STERILE IRR 1000ML POUR (IV SOLUTION) ×2 IMPLANT

## 2021-07-02 NOTE — Transfer of Care (Signed)
Immediate Anesthesia Transfer of Care Note  Patient: Davell Beckstead  Procedure(s) Performed: LEFT UPPER EXTREMITY ARTERIOVENOUS FISTULA CREATION (Left: Arm Upper)  Patient Location: PACU  Anesthesia Type:General  Level of Consciousness: drowsy, patient cooperative and responds to stimulation  Airway & Oxygen Therapy: Patient Spontanous Breathing and Patient connected to face mask oxygen  Post-op Assessment: Report given to RN and Post -op Vital signs reviewed and stable  Post vital signs: Reviewed and stable  Last Vitals:  Vitals Value Taken Time  BP 116/68 07/02/21 1109  Temp    Pulse 101 07/02/21 1111  Resp 15 07/02/21 1111  SpO2 100 % 07/02/21 1111  Vitals shown include unvalidated device data.  Last Pain:  Vitals:   07/02/21 0749  TempSrc:   PainSc: 0-No pain      Patients Stated Pain Goal: 2 (58/00/63 4949)  Complications: No notable events documented.

## 2021-07-02 NOTE — Telephone Encounter (Signed)
° °  Telephone encounter was:  Successful.  07/02/2021 Name: Paul [name] MRN: [id number] DOB: 01/14/1991  Paul [name] is a 31 y.o. year old male who is a primary care patient of Orvis Brill, DO . The community resource team was consulted for assistance with  support groups  Care guide performed the following interventions:  Pt advised he is interesting receiving all information by e-mail regarding support groups. He advised at this time he does not need any further resources.  Follow Up Plan:  Care guide will outreach resources to assist patient with kidney support groups.  [place] management  [place], [place]  Main Phone: [phone]   E-mail: [email or web address]  Website: [email or web address]

## 2021-07-02 NOTE — Op Note (Addendum)
° ° °  Patient name: Paul [name] MRN: [id number] DOB: [birth year] Sex: male  07/02/2021 Pre-operative Diagnosis: End-stage renal disease Post-operative diagnosis:  Same Surgeon:  Erlene Quan C. Donzetta Matters, MD Assistant: Arlee Muslim, PA Procedure Performed:  Left upper arm brachial artery to basilic vein AV fistula creation  Indications: 31 year old male with history of end-stage renal disease currently dialyzing via catheter.  He has multiple upper extremity accesses in the past with all of them having failed.  Currently he is anticoagulated.  SVC thrombus.  He is right-hand dominant.  He is recent undergone venography and we are now planning for left upper extremity AV fistula versus repeat grafting.  An experienced assistant was required given the complexity of this procedure and the standard of surgical care.  He helped with exposure using counter tension and suctioning. He expedited the anastomosis by following the suture.  Findings: The brachial artery was severely scarred and above the antecubital where there was a previous fistula which was thrombosed chronically and was divided.  The basilic vein was easily dilated to 4 mm above the antecubitum this was sewn into side of the brachial artery to completion there is a very strong thrill and a signal at the radial artery at the wrist.   Procedure:  The patient was identified in the holding area and taken to the operating room where is placed supine operative table and LMA anesthesia was induced.  He was sterilely prepped and draped in the left upper extremity usual fashion, antibiotics were administered and a timeout was called.  Ultrasound was used we identified what appeared to be a suitable basilic vein tracing from the antecubital to just above it.  A transverse incision was made we dissected down to the basilic vein we marked this orientation the multiple branches that were divided between clips and ties all the way down past the antecubitum through the  same incision.  We assistant helped with dividing the multiple branches.  We then dissected through the deep fascia.  There was a previous fistula test to the artery which was actually tethering the artery laterally.  We divided this fistula was chronically thrombosed.  This allowed the artery to be freed up medially.  The artery was then clamped distally and proximally opened longitudinally and flushed with heparinized saline in both directions.  There was good backbleeding and good antegrade bleeding in the artery.  The vein was then transected distally spatulated and serially dilated with dilators up to 4 mm and was flushed heparinized saline and clamped.  This was then sewn into side with 6-0 Prolene suture and the assistant maintained tension on the suture throughout this process.  Prior completion of flushing all directions.  Upon completion there was a very strong thrill traced with Doppler in the fistula and a signal at the radial artery at the wrist.  The wound was irrigated and hemostasis obtained and closed in layers with Vicryl and Monocryl.  Dermabond was placed at the skin level.  He was awakened from anesthesia having tolerated procedure without immediate complication.  All counts were correct at completion.  EBL: 50cc   Janiyha Montufar C. Donzetta Matters, MD [facility] Office: [phone] Pager: [phone]

## 2021-07-02 NOTE — H&P (Signed)
H+P   History of Present Illness:  31 y.o. male very pleasant gentleman with end-stage renal disease currently dialyzing via catheter.  Most recently he had a right upper extremity axillary loop graft after having a brachial graft in the right.  He is also had a left arm AV graft and at least one fistula attempted on the right.  All of these have failed he does have some history of stenting as well.  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  Right IJ was noted to be occluded when the catheter was recently placed on December 22.  He is now here for discussion of permanent access.  He is right-hand dominant denies any history of upper extremity swelling. Recently underwent upper extremity venography.   Past Medical History:  Diagnosis Date   Acute hypoxemic respiratory failure ([place]) 09/11/2016   Anemia    Chronic kidney disease    ARF on CRF Dialysis T/TH/Sa   Diabetes mellitus    Type II   End stage renal disease on dialysis ([place])    [place]     GERD (gastroesophageal reflux disease)    diet controlled   HLD (hyperlipidemia)    Hypertension    Hypothyroidism    Morbid obesity ([place])    Sacral wound    resolved   Thyroid disease     Past Surgical History:  Procedure Laterality Date   AMPUTATION Left 05/08/2021   Procedure: AMPUTATION GREAT TOE;  Surgeon: Newt Minion, MD;  Location: [facility];  Service: Orthopedics;  Laterality: Left;   AV FISTULA PLACEMENT Left 09/26/2016   Procedure: LEFT UPPER ARM ARTERIOVENOUS (AV) FISTULA CREATION;  Surgeon: Elam Dutch, MD;  Location: [facility];  Service: Vascular;  Laterality: Left;   AV FISTULA PLACEMENT Left 12/02/2016   Procedure: INSERTION OF ARTERIOVENOUS GORE-TEX GRAFT LEFT UPPER  ARM USING A 4-7MM BY 45CM GRAFT ;  Surgeon: Rosetta Posner, MD;  Location: [facility];  Service: Vascular;  Laterality: Left;   AV FISTULA PLACEMENT Right 07/03/2020   Procedure: INSERTION OF ARTERIOVENOUS (AV) GORE-TEX GRAFT RIGHT UPPER ARM;  Surgeon:  Marty Heck, MD;  Location: [facility];  Service: Vascular;  Laterality: Right;   [place] Right 05/06/2020   Procedure: FIRST STAGE RIGHT [place];  Surgeon: Serafina Mitchell, MD;  Location: [facility];  Service: Vascular;  Laterality: Right;   EXCHANGE OF A DIALYSIS CATHETER Right 10/08/2016   Procedure: EXCHANGE OF A DIALYSIS CATHETER-RIGHT INTERNAL JUGULAR;  Surgeon: Conrad Fromberg, MD;  Location: [facility];  Service: Vascular;  Laterality: Right;   INSERTION OF DIALYSIS CATHETER Right 09/26/2016   Procedure: INSERTION OF DIALYSIS CATHETER - Right Internal Jugular Placement;  Surgeon: Elam Dutch, MD;  Location: [facility];  Service: Vascular;  Laterality: Right;   INSERTION OF DIALYSIS CATHETER Left 03/22/2018   Procedure: INSERTION OF DIALYSIS CATHETER;  Surgeon: Marty Heck, MD;  Location: [facility];  Service: Vascular;  Laterality: Left;   IR FLUORO GUIDE CV LINE RIGHT  03/26/2021   IR FLUORO GUIDE CV LINE RIGHT  05/13/2021   IR REMOVAL TUN CV CATH W/O FL  05/11/2021   IR US GUIDE VASC ACCESS RIGHT  03/26/2021   IR US GUIDE VASC ACCESS RIGHT  05/13/2021   REVISON OF ARTERIOVENOUS FISTULA Left 03/22/2018   Procedure: LEFT UPPER EXTREMITY ARTERIOVENOUS  REVISION WITH GORE-TEX GRAFT.;  Surgeon: Marty Heck, MD;  Location: [facility];  Service: Vascular;  Laterality: Left;   TEE WITHOUT  CARDIOVERSION N/A 05/12/2021   Procedure: TRANSESOPHAGEAL ECHOCARDIOGRAM (TEE);  Surgeon: Werner Lean, MD;  [facility] END[facility];  Service: Cardiovascular;  Laterality: N/A;   THROMBECTOMY Left 08/26/2019   thrombectomy of LUA loop AVG   THROMBECTOMY AND REVISION OF ARTERIOVENTOUS (AV) GORETEX  GRAFT Right 07/27/2020   Procedure: INSERTION OF RIGHT ARM LOOP GRAFT WITH EXCISION OF RIGHT ARM BRACHIAL AXILLARY GRAFT;  Surgeon: Marty Heck, MD;  Location: [facility];  Service: Vascular;  Laterality: Right;   UPPER EXTREMITY VENOGRAPHY Bilateral 06/14/2021    Procedure: UPPER EXTREMITY VENOGRAPHY;  Surgeon: Waynetta Sandy, MD;  Location: [facility] CV LAB;  Service: Cardiovascular;  Laterality: Bilateral;   VISCERAL ANGIOGRAPHY Right 06/17/2020   Procedure: CENTRAL VENO;  Surgeon: Cherre Robins, MD;  Location: [facility];  Service: Cardiovascular;  Laterality: Right;    Allergies  Allergen Reactions   Hydralazine Hcl Other (See Comments)    DRUG-INDUCED LUPUS    Prior to Admission medications   Medication Sig Start Date End Date Taking? Authorizing Provider  acetaminophen (TYLENOL) 500 MG tablet Take 1,000 mg by mouth every 6 (six) hours as needed (for pain.).   Yes [provider]  apixaban (ELIQUIS) 5 MG TABS tablet Take 1 tablet (5 mg total) by mouth 2 (two) times daily. START TAKING ONLY AFTER STARTER PACK HAS BEEN COMPLETED 06/14/21  Yes Bonnielee Haff, MD  calcium carbonate (TUMS - DOSED IN MG ELEMENTAL CALCIUM) 500 MG chewable tablet Chew 1,000-1,500 mg by mouth daily as needed for indigestion or heartburn.   Yes [provider]  cinacalcet (SENSIPAR) 60 MG tablet Take 60 mg by mouth daily.   Yes [provider]  ferric citrate (AURYXIA) 1 GM 210 MG(Fe) tablet Take 420-840 mg by mouth See admin instructions. Take 4 tablets (840 mg) by mouth with each meal & take 2 tablets (420 mg) by mouth with each snack.   Yes [provider]  gabapentin (NEURONTIN) 100 MG capsule Take 1 capsule (100 mg total) by mouth 3 (three) times daily. 05/14/21  Yes Bonnielee Haff, MD  insulin glargine (LANTUS) 100 UNIT/ML injection Inject 0.08 mLs (8 Units total) into the skin daily. Patient taking differently: Inject 10 Units into the skin 2 (two) times daily. 05/14/21  Yes Bonnielee Haff, MD  levothyroxine (SYNTHROID) 75 MCG tablet TAKE 1 TABLET BY MOUTH ONCE DAILY BEFORE BREAKFAST Patient taking differently: Take 150 mcg by mouth daily before breakfast. 06/16/21  Yes Dameron, Luna Fuse, DO  multivitamin  (RENA-VIT) TABS tablet Take 1 tablet by mouth at bedtime. 10/25/16  Yes Nicolette Bang, MD  rosuvastatin (CRESTOR) 20 MG tablet TAKE 2 TABLETS BY MOUTH ONCE DAILY . APPOINTMENT REQUIRED FOR FUTURE REFILLS 06/16/21  Yes Dameron, Luna Fuse, DO  Accu-Chek Softclix Lancets lancets Use as instructed 06/11/21   Eppie Gibson, MD  Blood Glucose Monitoring Suppl (ACCU-CHEK GUIDE ME) w/Device KIT 1 kit by Does not apply route daily. 05/22/21   Eppie Gibson, MD  glucose blood (ACCU-CHEK GUIDE) test strip 1 each by Other route 2 (two) times daily. Use as instructed 05/26/21   Eppie Gibson, MD  heparin 1000 unit/mL SOLN injection Heparin Sodium (Porcine) 1,000 Units/mL Systemic 08/15/20 08/14/21  [provider]  midodrine (PROAMATINE) 5 MG tablet Take 1 tablet (5 mg total) by mouth 3 (three) times daily with meals. Patient not taking: Reported on 06/18/2021 05/14/21   Bonnielee Haff, MD  oxyCODONE-acetaminophen (PERCOCET) 7.5-325 MG tablet Take 1 tablet by mouth every 6 (  six) hours as needed for severe pain or moderate pain. 06/28/21   Newt Minion, MD  polyethylene glycol (MIRALAX / GLYCOLAX) 17 g packet Take 17 g by mouth daily as needed. Patient not taking: Reported on 06/18/2021 05/14/21   Bonnielee Haff, MD  Syringe, Disposable, (B-D SYRINGE SLIP TIP 30CC) 30 ML MISC 1 Syringe by Does not apply route daily. 01/31/19   Wilber Oliphant, MD    Social History   Socioeconomic History   Marital status: Single    Spouse name: Not on file   Number of children: Not on file   Years of education: Not on file   Highest education level: Not on file  Occupational History   Not on file  Tobacco Use   Smoking status: Never   Smokeless tobacco: Never  Vaping Use   Vaping Use: Never used  Substance and Sexual Activity   Alcohol use: No   Drug use: Yes    Frequency: 2.0 times per week    Types: Marijuana    Comment: Last time 07/23/20   Sexual activity: Not on file  Other Topics Concern    Not on file  Social History Narrative   Not on file   Social Determinants of Health   Financial Resource Strain: Not on file  Food Insecurity: No Food Insecurity   Worried About Charity fundraiser in the Last Year: Never true   Arboriculturist in the Last Year: Never true  Transportation Needs: Unmet Transportation Needs   Lack of Transportation (Medical): Yes   Lack of Transportation (Non-Medical): Yes  Physical Activity: Not on file  Stress: Not on file  Social Connections: Socially Isolated   Frequency of Communication with Friends and Family: More than three times a week   Frequency of Social Gatherings with Friends and Family: More than three times a week   Attends Religious Services: Never   Marine scientist or Organizations: No   Attends Archivist Meetings: Never   Marital Status: Never married  Human resources officer Violence: Not on file     Family History  Problem Relation Age of Onset   Heart disease Mother    Hypertension Mother     ROS: no complaints today  Physical Examination  Vitals:   07/02/21 0656  BP: 129/73  Pulse: 83  Resp: 18  Temp: 97.9 F (36.6 C)  SpO2: 98%   Body mass index is 41.33 kg/m.  Aaox3 Non labored respirations 1+ left radial pulse  CBC    Component Value Date/Time   WBC 11.5 (H) 05/14/2021 0611   RBC 4.39 05/14/2021 0611   HGB 14.3 06/14/2021 0626   HCT 42.0 06/14/2021 0626   PLT 346 05/14/2021 0611   MCV 83.4 05/14/2021 0611   MCH 27.1 05/14/2021 0611   MCHC 32.5 05/14/2021 0611   RDW 17.5 (H) 05/14/2021 0611   LYMPHSABS 2.4 05/06/2021 1600   MONOABS 1.7 (H) 05/06/2021 1600   EOSABS 0.1 05/06/2021 1600   BASOSABS 0.1 05/06/2021 1600    BMET    Component Value Date/Time   NA 140 06/14/2021 0626   NA 139 06/14/2019 0914   K 4.8 06/14/2021 0626   CL 103 06/14/2021 0626   CO2 21 (L) 05/14/2021 0611   GLUCOSE 110 (H) 06/14/2021 0626   BUN 41 (H) 06/14/2021 0626   BUN 31 (H) 06/14/2019 0914    CREATININE 16.10 (H) 06/14/2021 0626   CREATININE 1.85 (H) 11/28/2014 1153  CALCIUM 9.3 05/14/2021 0611   GFRNONAA 5 (L) 05/14/2021 0611   GFRNONAA 51 (L) 11/28/2014 1153   GFRAA 6 (L) 06/14/2019 0914   GFRAA 58 (L) 11/28/2014 1153    COAGS: Lab Results  Component Value Date   INR 1.3 (H) 05/06/2021   INR 1.44 09/11/2016     Non-Invasive Vascular Imaging:   Venography reviewed  ASSESSMENT/PLAN: This is a 31 y.o. male with esrd. Plan left arm avf vs avg today in OR.   Weston Kallman C. Donzetta Matters, MD [facility] Office: [phone] Pager: [phone]

## 2021-07-02 NOTE — Anesthesia Procedure Notes (Signed)
Procedure Name: Intubation Date/Time: 07/02/2021 9:17 AM Performed by: Michele Rockers, CRNA Pre-anesthesia Checklist: Patient identified, Patient being monitored, Timeout performed, Emergency Drugs available and Suction available Patient Re-evaluated:Patient Re-evaluated prior to induction Oxygen Delivery Method: Circle system utilized Preoxygenation: Pre-oxygenation with 100% oxygen Induction Type: IV induction Ventilation: Mask ventilation with difficulty, Two handed mask ventilation required and Oral airway inserted - appropriate to patient size Laryngoscope Size: Glidescope and 4 Grade View: Grade I Tube type: Oral Tube size: 7.5 mm Number of attempts: 1 Airway Equipment and Method: Stylet and Rigid stylet Placement Confirmation: ETT inserted through vocal cords under direct vision, positive ETCO2 and breath sounds checked- equal and bilateral Secured at: 24 cm Tube secured with: Tape Dental Injury: Teeth and Oropharynx as per pre-operative assessment  Difficulty Due To: Difficulty was anticipated and Difficult Airway- due to large tongue Future Recommendations: Recommend- induction with short-acting agent, and alternative techniques readily available

## 2021-07-02 NOTE — Discharge Instructions (Signed)
° °  [facility] ° °Discharge Instructions ° °AV Fistula or Graft Surgery for Dialysis Access ° °Please refer to the following instructions for your post-procedure care. Your surgeon or physician assistant will discuss any changes with you. ° °Activity ° °You may drive the day following your surgery, if you are comfortable and no longer taking prescription pain medication. Resume full activity as the soreness in your incision resolves. ° °Bathing/Showering ° °You may shower after you go home. Keep your incision dry for 48 hours. Do not soak in a bathtub, hot tub, or swim until the incision heals completely. You may not shower if you have a hemodialysis catheter. ° °Incision Care ° °Clean your incision with mild soap and water after 48 hours. Pat the area dry with a clean towel. You do not need a bandage unless otherwise instructed. Do not apply any ointments or creams to your incision. You may have skin glue on your incision. Do not peel it off. It will come off on its own in about one week. Your arm may swell a bit after surgery. To reduce swelling use pillows to elevate your arm so it is above your heart. Your doctor will tell you if you need to lightly wrap your arm with an ACE bandage. ° °Diet ° °Resume your normal diet. There are not special food restrictions following this procedure. In order to heal from your surgery, it is CRITICAL to get adequate nutrition. Your body requires vitamins, minerals, and protein. Vegetables are the best source of vitamins and minerals. Vegetables also provide the perfect balance of protein. Processed food has little nutritional value, so try to avoid this. ° °Medications ° °Resume taking all of your medications. If your incision is causing pain, you may take over-the counter pain relievers such as acetaminophen (Tylenol). If you were prescribed a stronger pain medication, please be aware these medications can cause nausea and constipation. Prevent  nausea by taking the medication with a snack or meal. Avoid constipation by drinking plenty of fluids and eating foods with high amount of fiber, such as fruits, vegetables, and grains. Do not take Tylenol if you are taking prescription pain medications. ° ° ° ° °Follow up °Your surgeon may want to see you in the office following your access surgery. If so, this will be arranged at the time of your surgery. ° °Please call us immediately for any of the following conditions: ° °Increased pain, redness, drainage (pus) from your incision site °Fever of 101 degrees or higher °Severe or worsening pain at your incision site °Hand pain or numbness. ° °Reduce your risk of vascular disease: ° °Stop smoking. If you would like help, call [name] at 1-800-QUIT-NOW (1-800-784-8669) or [place] at 336-586-4000 ° °Manage your cholesterol °Maintain a desired weight °Control your diabetes °Keep your blood pressure down ° °Dialysis ° °It will take several weeks to several months for your new dialysis access to be ready for use. Your surgeon will determine when it is OK to use it. Your nephrologist will continue to direct your dialysis. You can continue to use your Permcath until your new access is ready for use. ° °If you have any questions, please call the office at [phone]. ° °

## 2021-07-02 NOTE — Anesthesia Preprocedure Evaluation (Signed)
Anesthesia Evaluation  Patient identified by MRN, date of birth, ID band Patient awake    Reviewed: Allergy & Precautions, NPO status , Patient's Chart, lab work & pertinent test results  Airway Mallampati: II  TM Distance: >3 FB Neck ROM: Full    Dental  (+) Teeth Intact, Dental Advisory Given   Pulmonary neg pulmonary ROS,    Pulmonary exam normal breath sounds clear to auscultation       Cardiovascular hypertension, Normal cardiovascular exam Rhythm:Regular Rate:Normal     Neuro/Psych  Headaches, negative psych ROS   GI/Hepatic Neg liver ROS, GERD  ,  Endo/Other  diabetes, Type 2Hypothyroidism   Renal/GU ESRF and DialysisRenal disease     Musculoskeletal negative musculoskeletal ROS (+)   Abdominal   Peds  Hematology negative hematology ROS (+)   Anesthesia Other Findings Day of surgery medications reviewed with the patient.  Reproductive/Obstetrics                             Anesthesia Physical Anesthesia Plan  ASA: 3  Anesthesia Plan: General   Post-op Pain Management: Tylenol PO (pre-op)   Induction: Intravenous  PONV Risk Score and Plan: 2 and Treatment may vary due to age or medical condition, Dexamethasone and Ondansetron  Airway Management Planned: LMA  Additional Equipment:   Intra-op Plan:   Post-operative Plan: Extubation in OR  Informed Consent: I have reviewed the patients History and Physical, chart, labs and discussed the procedure including the risks, benefits and alternatives for the proposed anesthesia with the patient or authorized representative who has indicated his/her understanding and acceptance.     Dental advisory given  Plan Discussed with: CRNA  Anesthesia Plan Comments:         Anesthesia Quick Evaluation

## 2021-07-03 NOTE — Anesthesia Postprocedure Evaluation (Signed)
Anesthesia Post Note  Patient: Paul [name]  Procedure(s) Performed: LEFT UPPER EXTREMITY ARTERIOVENOUS FISTULA CREATION (Left: Arm Upper)     Patient location during evaluation: PACU Anesthesia Type: General Level of consciousness: awake and alert Pain management: pain level controlled Vital Signs Assessment: post-procedure vital signs reviewed and stable Respiratory status: spontaneous breathing, nonlabored ventilation, respiratory function stable and patient connected to nasal cannula oxygen Cardiovascular status: blood pressure returned to baseline and stable Postop Assessment: no apparent nausea or vomiting Anesthetic complications: no   No notable events documented.  Last Vitals:  Vitals:   07/02/21 1155 07/02/21 1210  BP: (!) 99/34 (!) 104/43  Pulse: (!) 102 97  Resp: (!) 26 18  Temp:  36.6 C  SpO2: 99% 100%    Last Pain:  Vitals:   07/02/21 1210  TempSrc:   PainSc: 0-No pain                 Santa Lighter

## 2021-07-04 ENCOUNTER — Encounter (HOSPITAL_COMMUNITY): Payer: Self-pay | Admitting: Vascular Surgery

## 2021-07-07 ENCOUNTER — Other Ambulatory Visit: Payer: Self-pay | Admitting: *Deleted

## 2021-07-07 ENCOUNTER — Other Ambulatory Visit: Payer: Self-pay

## 2021-07-07 NOTE — Patient Instructions (Signed)
Visit Information  Paul [name] was given information about Medicaid Managed Care team care coordination services as a part of their [place] [employer] benefit. Paul [name] verbally consented to engagement with the [facility] Managed Care team.   If you are experiencing a medical emergency, please call 911 or report to your local emergency department or urgent care.   If you have a non-emergency medical problem during routine business hours, please contact your provider's office and ask to speak with a nurse.   For questions related to your [facility], please call: [phone] or visit the homepage here: [email or web address]  If you would like to schedule transportation through your [facility], please call the following number at least 2 days in advance of your appointment: [phone].   Call the [place] at [phone], at any time, 24 hours a day, 7 days a week. If you are in danger or need immediate medical attention call 911.  If you would like help to quit smoking, call 1-800-QUIT-NOW 818-610-6392) OR Espaol: 1-855-Djelo-Ya (7-517-001-7494) o para ms informacin haga clic aqu or Text READY to 200-400 to register via text  Paul [name],   Please see education materials related to diabetes provided by MyChart link.  Patient verbalizes understanding of instructions and care plan provided today and agrees to view in [place]. Active MyChart status confirmed with patient.    Telephone follow up appointment with Managed Medicaid care management team member scheduled for:08/06/21 @ 08/06/21  Paul Joiner RN, BSN [facility] RN Care Coordinator   Following is a copy of your plan of care:  Care Plan : RN Care Manager Plan of Care  Updates made by Paul Montane, RN since 07/07/2021 12:00 AM     Problem: Health  management needs related to DMII and recent great toe amputation      Long-Range Goal: Development of Plan of Care to address health management needs related to DMII and recent great toe amputation   Start Date: 05/21/2021  Expected End Date: 08/19/2021  Priority: High  Note:   Current Barriers:  Chronic Disease Management support and education needs related to DMII and great toe amputation  Paul [name] is checking his blood sugar 1-2 times a day and reports readings are always less than 200. He is at the pharmacy picking up prescriptions currently. He had a fistula creation on 07/02/21. He missed his follow up with Dr. Sharol Given on 2/9 and will call to reschedule.   RNCM Clinical Goal(s):  Patient will verbalize understanding of plan for management of DMII and great toe amputation as evidenced by patient verbalization of self monitoring activity attend all scheduled medical appointments: 1/17 with Dr. Baxter Flattery, 1/23 for Procedure with Dr. Donzetta Matters and 2/9 with Dr. Sharol Given as evidenced by provider documentation in EMR        demonstrate improved adherence to prescribed treatment plan for DMII as evidenced by provider documentation in EMR work with pharmacist to address understanding of medications related to DMII as evidenced by review of EMR and patient or pharmacist report    through collaboration with RN Care manager, provider, and care team.   Interventions: Inter-disciplinary care team collaboration (see longitudinal plan of care) Evaluation of current treatment plan related to  self management and patient's adherence to plan as established by provider Provided patient with information for eye exam referral-[facility] [phone]-encouraged patient to call and schedule an exam Advised patient  to take his glucometer and all medications with him to his PCP appointment on 07/09/21   Diabetes:  (Status: Goal on Track (progressing): YES.) Long Term Goal   Lab Results  Component Value Date    HGBA1C 6.0 (H) 05/07/2021  Assessed patient's understanding of A1c goal: <7% Reviewed medications with patient and discussed importance of medication adherence;        Discussed plans with patient for ongoing care management follow up and provided patient with direct contact information for care management team;      Provided patient with written educational materials related to hypo and hyperglycemia and importance of correct treatment;       Reviewed scheduled/upcoming provider appointments including: rescheduling missed appt with Dr. Sharol Given and PCP visit on 07/09/21;         Review of patient status, including review of consultants reports, relevant laboratory and other test results, and medications completed;       Advised patient to discuss podiatry referral with provider;      Assessed social determinant of health barriers;        Encouraged patient to continue to increase exercise to 150 min/week Discussed diet and the importance of 3 meals a day, including lean protein with each meal, examples provided  Great toe amputation  (Status: Goal on Track (progressing): YES.) Long Term Goal  Discussed plans with patient for ongoing care management follow up and provided patient with direct contact information for care management team Reviewed medications with patient and discussed importance of compliance; Reviewed scheduled/upcoming provider appointments including rescheduling missed appt with Dr. Sharol Given; Discussed plans with patient for ongoing care management follow up and provided patient with direct contact information for care management team; Assessed social determinant of health barriers;   Patient Goals/Self-Care Activities: Take medications as prescribed   Attend all scheduled provider appointments Call pharmacy for medication refills 3-7 days in advance of running out of medications Perform all self care activities independently  Call provider office for new concerns or questions   schedule appointment with eye doctor check blood sugar at prescribed times: 4 times daily and when you have symptoms of low or high blood sugar enter blood sugar readings and medication or insulin into daily log take the blood sugar log to all doctor visits fill half of plate with vegetables manage portion size prepare main meal at home 3 to 5 days each week

## 2021-07-07 NOTE — Patient Outreach (Signed)
Medicaid Managed Care   Nurse Care Manager Note  07/07/2021 Name:  Paul [name] MRN:  [id number] DOB:  March 10, 1991  Paul [name] is an 31 y.o. year old male who is [name] primary patient of Orvis Brill, DO.  The [facility] Managed Care Coordination team was consulted for assistance with:    Toe amputation DMII  Mr. Paul [name] was given information about Medicaid Managed Care Coordination team services today. Paul Deed Patient agreed to services and verbal consent obtained.  Engaged with patient by telephone for follow up visit in response to provider referral for case management and/or care coordination services.   Assessments/Interventions:  Review of past medical history, allergies, medications, health status, including review of consultants reports, laboratory and other test data, was performed as part of comprehensive evaluation and provision of chronic care management services.  SDOH (Social Determinants of Health) assessments and interventions performed: SDOH Interventions    Flowsheet Row Most Recent Value  SDOH Interventions   Physical Activity Interventions Intervention Not Indicated       Care Plan  Allergies  Allergen Reactions   Hydralazine Hcl Other (See Comments)    DRUG-INDUCED LUPUS    Medications Reviewed Today     Reviewed by Paul Montane, RN (Registered Nurse) on 07/07/21 at 1054  Med List Status: <None>   Medication Order Taking? Sig Documenting Provider Last Dose Status Informant  Accu-Chek Softclix Lancets lancets [id number] Yes Use as instructed Paul Gibson, MD Taking Active Self  acetaminophen (TYLENOL) 500 MG tablet [id number] Yes Take 1,000 mg by mouth every 6 (six) hours as needed (for pain.). [provider] Taking Active Self  apixaban (ELIQUIS) 5 MG TABS tablet [id number] Yes Take 1 tablet (5 mg total) by mouth 2 (two) times daily. START TAKING ONLY AFTER STARTER PACK HAS BEEN COMPLETED Bonnielee Haff, MD Taking Active Self  Blood  Glucose Monitoring Suppl (ACCU-CHEK GUIDE ME) w/Device KIT [id number] Yes 1 kit by Does not apply route daily. Paul Gibson, MD Taking Active Self           Med Note Paul [name], Paul [name]   Mon Jun 28, 2021 10:54 AM)    calcium carbonate (TUMS - DOSED IN MG ELEMENTAL CALCIUM) 500 MG chewable tablet [id number] Yes Chew 1,000-1,500 mg by mouth daily as needed for indigestion or heartburn. [provider] Taking Active Self  cinacalcet (SENSIPAR) 60 MG tablet [id number] Yes Take 60 mg by mouth daily. [provider] Taking Active Self           Med Note [name] Jun 08, 2021 12:13 PM)    ferric citrate (AURYXIA) 1 GM 210 MG(Fe) tablet [id number] Yes Take 420-840 mg by mouth See admin instructions. Take 4 tablets (840 mg) by mouth with each meal & take 2 tablets (420 mg) by mouth with each snack. [provider] Taking Active Self  gabapentin (NEURONTIN) 100 MG capsule [id number] Yes Take 1 capsule (100 mg total) by mouth 3 (three) times daily. Bonnielee Haff, MD Taking Active Self  glucose blood (ACCU-CHEK GUIDE) test strip [id number] Yes 1 each by Other route 2 (two) times daily. Use as instructed Paul Gibson, MD Taking Active Self  heparin 1000 unit/mL SOLN injection [id number] Yes Heparin Sodium (Porcine) 1,000 Units/mL Systemic [provider] Taking Active Self           Med Note Paul [name]   Fri Apr 30, 2021  8:27 AM) Administered at dialysis  insulin glargine (LANTUS) 100  UNIT/ML injection [id number] Yes Inject 0.08 mLs (8 Units total) into the skin daily.  Patient taking differently: Inject 10 Units into the skin 2 (two) times daily.   Bonnielee Haff, MD Taking Active Self           Med Note Paul [name], Paul [name]   Mon Jun 28, 2021 10:55 AM)    levothyroxine (SYNTHROID) 75 MCG tablet [id number] Yes TAKE 1 TABLET BY MOUTH ONCE DAILY BEFORE BREAKFAST  Patient taking differently: Take 150 mcg by mouth daily before breakfast.   Orvis Brill,  DO Taking Active Self           Med Note Paul [name], Paul [name]   Mon Jun 28, 2021 10:55 AM)    midodrine (PROAMATINE) 5 MG tablet [id number] Yes Take 1 tablet (5 mg total) by mouth 3 (three) times daily with meals. Bonnielee Haff, MD Taking Active Self  multivitamin (RENA-VIT) TABS tablet [id number] Yes Take 1 tablet by mouth at bedtime. Paul Bang, MD Taking Active Self  oxyCODONE-acetaminophen (PERCOCET) 7.5-325 MG tablet [id number] Yes Take 1 tablet by mouth every 6 (six) hours as needed for severe pain or moderate pain. Paul [name] M, PA-C Taking Active   polyethylene glycol (MIRALAX / GLYCOLAX) 17 g packet [id number] No Take 17 g by mouth daily as needed.  Patient not taking: Reported on 06/18/2021   Bonnielee Haff, MD Not Taking Active Self           Med Note [place] Jun 28, 2021 10:57 AM)    rosuvastatin (CRESTOR) 20 MG tablet [id number] Yes TAKE 2 TABLETS BY MOUTH ONCE DAILY . APPOINTMENT REQUIRED FOR FUTURE REFILLS Orvis Brill, DO Taking Active Self  Syringe, Disposable, (B-D SYRINGE SLIP TIP 30CC) 30 ML MISC [id number] Yes 1 Syringe by Does not apply route daily. Wilber Oliphant, MD Taking Active Self  Med List Note Paul [name] 07/24/20 8502): Dialysis Tues, thurs and Saturday            Patient Active Problem List   Diagnosis Date Noted   Diabetic foot infection ([place])    Wound infection 05/07/2021   MSSA bacteremia 05/07/2021   Osteomyelitis ([place]) 05/07/2021   Abscess 05/07/2021   Sepsis due to cellulitis ([place]) 05/06/2021   Poor compliance with medication 03/17/2021   Fluid overload, unspecified 77/41/2878   Complication of vascular dialysis catheter 03/23/2020   Other disorders of phosphorus metabolism 01/28/2020   Hypertension 01/08/2020   Hypocalcemia 10/29/2019   ESRD on dialysis ([place]) 11/16/2017   Pruritus, unspecified 04/04/2017   Unspecified protein-calorie malnutrition ([place]) 11/11/2016   Headache, unspecified 10/29/2016    Encounter for immunization 10/19/2016   Iron deficiency anemia, unspecified 10/10/2016   Anemia in chronic kidney disease 09/30/2016   Coagulation defect, unspecified ([place]) 09/30/2016   Pneumonia due to Pseudomonas ([place]) 09/30/2016   Secondary hyperparathyroidism of renal origin ([place]) 09/30/2016   Hypothyroidism 10/29/2009   Type 2 diabetes mellitus ([place]) 04/14/2009   Hyperlipidemia 04/14/2009   OBESITY, MORBID 04/14/2009   Essential hypertension, benign 04/14/2009    Conditions to be addressed/monitored per PCP order:  DMII and toe amputation  Care Plan : RN Care Manager Plan of Care  Updates made by Paul Montane, RN since 07/07/2021 12:00 AM     Problem: Health management needs related to DMII and recent great toe amputation      Long-Range Goal: Development of Plan of Care to address health management needs related to DMII and recent great toe amputation  Start Date: 05/21/2021  Expected End Date: 08/19/2021  Priority: High  Note:   Current Barriers:  Chronic Disease Management support and education needs related to DMII and great toe amputation  Mr. Tibbitts is checking his blood sugar 1-2 times [name] day and reports readings are always less than 200. He is at the pharmacy picking up prescriptions currently. He had [name] fistula creation on 07/02/21. He missed his follow up with Dr. Sharol Given on 2/9 and will call to reschedule.   RNCM Clinical Goal(s):  Patient will verbalize understanding of plan for management of DMII and great toe amputation as evidenced by patient verbalization of self monitoring activity attend all scheduled medical appointments: 1/17 with Dr. Baxter Flattery, 1/23 for Procedure with Dr. Donzetta Matters and 2/9 with Dr. Sharol Given as evidenced by provider documentation in EMR        demonstrate improved adherence to prescribed treatment plan for DMII as evidenced by provider documentation in EMR work with pharmacist to address understanding of medications related to DMII as evidenced by  review of EMR and patient or pharmacist report    through collaboration with RN Care manager, provider, and care team.   Interventions: Inter-disciplinary care team collaboration (see longitudinal plan of care) Evaluation of current treatment plan related to  self management and patient's adherence to plan as established by provider Provided patient with information for eye exam referral-[facility] [phone]-encouraged patient to call and schedule an exam Advised patient to take his glucometer and all medications with him to his PCP appointment on 07/09/21   Diabetes:  (Status: Goal on Track (progressing): YES.) Long Term Goal   Lab Results  Component Value Date   HGBA1C 6.0 (H) 05/07/2021  Assessed patient's understanding of A1c goal: <7% Reviewed medications with patient and discussed importance of medication adherence;        Discussed plans with patient for ongoing care management follow up and provided patient with direct contact information for care management team;      Provided patient with written educational materials related to hypo and hyperglycemia and importance of correct treatment;       Reviewed scheduled/upcoming provider appointments including: rescheduling missed appt with Dr. Sharol Given and PCP visit on 07/09/21;         Review of patient status, including review of consultants reports, relevant laboratory and other test results, and medications completed;       Advised patient to discuss podiatry referral with provider;      Assessed social determinant of health barriers;        Encouraged patient to continue to increase exercise to 150 min/week Discussed diet and the importance of 3 meals [name] day, including lean protein with each meal, examples provided  Great toe amputation  (Status: Goal on Track (progressing): YES.) Long Term Goal  Discussed plans with patient for ongoing care management follow up and provided patient with direct contact information for care  management team Reviewed medications with patient and discussed importance of compliance; Reviewed scheduled/upcoming provider appointments including rescheduling missed appt with Dr. Sharol Given; Discussed plans with patient for ongoing care management follow up and provided patient with direct contact information for care management team; Assessed social determinant of health barriers;   Patient Goals/Self-Care Activities: Take medications as prescribed   Attend all scheduled provider appointments Call pharmacy for medication refills 3-7 days in advance of running out of medications Perform all self care activities independently  Call provider office for new concerns or questions  schedule appointment with eye  doctor check blood sugar at prescribed times: 4 times daily and when you have symptoms of low or high blood sugar enter blood sugar readings and medication or insulin into daily log take the blood sugar log to all doctor visits fill half of plate with vegetables manage portion size prepare main meal at home 3 to 5 days each week       Follow Up:  Patient agrees to Care Plan and Follow-up.  Plan: The Managed Medicaid care management team will reach out to the patient again over the next 30 days.  Date/time of next scheduled RN care management/care coordination outreach:  08/06/21 @ 10:30am  Lurena Joiner RN, BSN [place] RN Care Coordinator

## 2021-07-09 ENCOUNTER — Other Ambulatory Visit: Payer: Self-pay

## 2021-07-09 ENCOUNTER — Ambulatory Visit: Payer: Medicaid Other | Admitting: Student

## 2021-07-19 ENCOUNTER — Telehealth: Payer: Self-pay

## 2021-07-19 NOTE — Telephone Encounter (Signed)
° °  Telephone encounter was:  Successful.  07/19/2021 Name: Paul [name] MRN: [id number] DOB: 31-Aug-1990  Paul [name] is a 31 y.o. year old male who is a primary care patient of Orvis Brill, DO . The community resource team was consulted for assistance with  Support Groups  Care guide performed the following interventions:  Pt was sleeping but was advised e-mail was sent. Pt said okay and hung up.  Pt was sleeping but was advised e-mail was sent. Pt said okay and hung up. Info provided: Kidney Programs ([employer], [employer] [facility], [facility]) [place] [facility](AAKP) Renal Support [employer] & (KUFA)  Follow Up Plan:  No further follow up planned at this time. The patient has been provided with needed resources.  [place] management  [place], [place]  Main Phone: [phone]   E-mail: [name]  Website: [email or web address]

## 2021-07-20 DIAGNOSIS — E1129 Type 2 diabetes mellitus with other diabetic kidney complication: Secondary | ICD-10-CM | POA: Diagnosis not present

## 2021-07-20 DIAGNOSIS — Z992 Dependence on renal dialysis: Secondary | ICD-10-CM | POA: Diagnosis not present

## 2021-07-20 DIAGNOSIS — N186 End stage renal disease: Secondary | ICD-10-CM | POA: Diagnosis not present

## 2021-07-21 ENCOUNTER — Telehealth: Payer: Self-pay | Admitting: Orthopedic Surgery

## 2021-07-21 DIAGNOSIS — E11628 Type 2 diabetes mellitus with other skin complications: Secondary | ICD-10-CM | POA: Diagnosis not present

## 2021-07-21 DIAGNOSIS — L089 Local infection of the skin and subcutaneous tissue, unspecified: Secondary | ICD-10-CM | POA: Diagnosis not present

## 2021-07-21 NOTE — Telephone Encounter (Signed)
No, refill not appropriate

## 2021-07-21 NOTE — Telephone Encounter (Signed)
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 ?Last filled on 07/02/21 #12 from cardiology from previous procedure on 07/02/21. ?Please advise ?

## 2021-07-21 NOTE — Telephone Encounter (Signed)
Patient called needing Rx refilled Oxycodone. The number to contact patient is [phone] ?

## 2021-07-23 ENCOUNTER — Other Ambulatory Visit (HOSPITAL_COMMUNITY): Payer: Self-pay

## 2021-07-23 NOTE — Telephone Encounter (Signed)
Pt informed cannot refill at this time. He needs to have a f/u appt for post op care. He is on the schedule 07/28/21 to discuss further. ?

## 2021-07-23 NOTE — Telephone Encounter (Signed)
Patient called back regarding message below he stated that his insurance only paid for a 5 day supply that's why he was needing a refill  ?

## 2021-07-24 ENCOUNTER — Encounter (HOSPITAL_COMMUNITY): Payer: Self-pay | Admitting: Emergency Medicine

## 2021-07-24 ENCOUNTER — Other Ambulatory Visit: Payer: Self-pay

## 2021-07-24 ENCOUNTER — Emergency Department (HOSPITAL_COMMUNITY)
Admission: EM | Admit: 2021-07-24 | Discharge: 2021-07-24 | Disposition: A | Discharge status: Home / Self Care | Payer: Medicaid Other | Attending: Emergency Medicine | Admitting: Emergency Medicine

## 2021-07-24 DIAGNOSIS — Z794 Long term (current) use of insulin: Secondary | ICD-10-CM | POA: Diagnosis not present

## 2021-07-24 DIAGNOSIS — T829XXA Unspecified complication of cardiac and vascular prosthetic device, implant and graft, initial encounter: Secondary | ICD-10-CM

## 2021-07-24 DIAGNOSIS — Z20822 Contact with and (suspected) exposure to covid-19: Secondary | ICD-10-CM | POA: Diagnosis not present

## 2021-07-24 DIAGNOSIS — Z79899 Other long term (current) drug therapy: Secondary | ICD-10-CM | POA: Insufficient documentation

## 2021-07-24 DIAGNOSIS — Z7901 Long term (current) use of anticoagulants: Secondary | ICD-10-CM | POA: Insufficient documentation

## 2021-07-24 DIAGNOSIS — Y732 Prosthetic and other implants, materials and accessory gastroenterology and urology devices associated with adverse incidents: Secondary | ICD-10-CM | POA: Diagnosis not present

## 2021-07-24 DIAGNOSIS — T8249XA Other complication of vascular dialysis catheter, initial encounter: Secondary | ICD-10-CM | POA: Insufficient documentation

## 2021-07-24 DIAGNOSIS — N186 End stage renal disease: Secondary | ICD-10-CM

## 2021-07-24 LAB — BASIC METABOLIC PANEL
Anion gap: 19 — ABNORMAL HIGH (ref 5–15)
BUN: 54 mg/dL — ABNORMAL HIGH (ref 6–20)
CO2: 23 mmol/L (ref 22–32)
Calcium: 8.4 mg/dL — ABNORMAL LOW (ref 8.9–10.3)
Chloride: 96 mmol/L — ABNORMAL LOW (ref 98–111)
Creatinine, Ser: 16.43 mg/dL — ABNORMAL HIGH (ref 0.61–1.24)
GFR, Estimated: 4 mL/min — ABNORMAL LOW (ref >60)
Glucose, Bld: 101 mg/dL — ABNORMAL HIGH (ref 70–99)
Potassium: 4 mmol/L (ref 3.5–5.1)
Sodium: 138 mmol/L (ref 135–145)

## 2021-07-24 LAB — CBC
HCT: 41.8 % (ref 39.0–52.0)
Hemoglobin: 13.2 g/dL (ref 13.0–17.0)
MCH: 27.1 pg (ref 26.0–34.0)
MCHC: 31.6 g/dL (ref 30.0–36.0)
MCV: 85.8 fL (ref 80.0–100.0)
Platelets: 193 10*3/uL (ref 150–400)
RBC: 4.87 MIL/uL (ref 4.22–5.81)
RDW: 18.6 % — ABNORMAL HIGH (ref 11.5–15.5)
WBC: 9.2 10*3/uL (ref 4.0–10.5)
nRBC: 0 % (ref 0.0–0.2)

## 2021-07-24 LAB — RESP PANEL BY RT-PCR (FLU A&B, COVID) ARPGX2
Influenza A by PCR: NEGATIVE
Influenza B by PCR: NEGATIVE
SARS Coronavirus 2 by RT PCR: NEGATIVE

## 2021-07-24 MED ORDER — HEPARIN SODIUM (PORCINE) 1000 UNIT/ML IJ SOLN
1900.0000 [IU] | Freq: Once | INTRAMUSCULAR | Status: AC
  Administered 2021-07-24: 1900 [IU]

## 2021-07-24 MED ORDER — ALTEPLASE 2 MG IJ SOLR
2.0000 mg | Freq: Once | INTRAMUSCULAR | Status: DC
  Filled 2021-07-24: qty 2

## 2021-07-24 MED ORDER — ALTEPLASE 2 MG IJ SOLR
2.0000 mg | Freq: Once | INTRAMUSCULAR | Status: AC
  Administered 2021-07-24: 2 mg
  Filled 2021-07-24: qty 2

## 2021-07-24 MED ORDER — HEPARIN SODIUM (PORCINE) 1000 UNIT/ML IJ SOLN
1900.0000 [IU] | Freq: Once | INTRAMUSCULAR | Status: AC
  Administered 2021-07-24: 1900 [IU]
  Filled 2021-07-24: qty 1.9

## 2021-07-24 NOTE — ED Triage Notes (Signed)
Pt states he went to dialysis this morning and dialysis catheter was clogged.  Last dialysis on Thursday.   ?

## 2021-07-24 NOTE — Discharge Instructions (Signed)
It was our pleasure to provide your ER care today - we hope that you feel better. ? ?Go to your dialysis center now for dialysis.  ? ?Return to ER if worse, new symptoms, fevers, trouble breathing, or other concern.  ?

## 2021-07-24 NOTE — Progress Notes (Signed)
Assessed HD cath and good blood returns on blue port (venous), flush was okay, but no blood returns on red port ( arterial). Heparin lock 1900 units on blue port and instilled Alteplase on red port for 2 hours and now good blood returns. Heparin lock 1900 units on red port. HS Truman Hayward RN  ?

## 2021-07-24 NOTE — ED Provider Notes (Signed)
?[facility] ?Provider Note ? ? ?CSN: [id number] ?Arrival date & time: 07/24/21  0736 ? ?  ? ?History ? ?Chief Complaint  ?Patient presents with  ? Vascular Access Problem  ? ? ?Paul [name] is a 31 y.o. male. ? ?Pt with hx esrd/hd T/TH/S sent from HD center b/c they were not able to get adequate flow via cath for dialysis this AM. Pt denies other recent problems w right chest HD catheter. No pain to area. States otherwise feels fine, at baseline, no new c/o.  ? ?The history is provided by the patient and medical records.  ? ?  ? ?Home Medications ?Prior to Admission medications   ?Medication Sig Start Date End Date Taking? Authorizing Provider  ?Accu-Chek Softclix Lancets lancets Use as instructed 06/11/21   Eppie Gibson, MD  ?acetaminophen (TYLENOL) 500 MG tablet Take 1,000 mg by mouth every 6 (six) hours as needed (for pain.).    [provider]  ?apixaban (ELIQUIS) 5 MG TABS tablet Take 1 tablet (5 mg total) by mouth 2 (two) times daily. START TAKING ONLY AFTER STARTER PACK HAS BEEN COMPLETED 06/14/21   Bonnielee Haff, MD  ?Blood Glucose Monitoring Suppl (ACCU-CHEK GUIDE ME) w/Device KIT 1 kit by Does not apply route daily. 05/22/21   Eppie Gibson, MD  ?calcium carbonate (TUMS - DOSED IN MG ELEMENTAL CALCIUM) 500 MG chewable tablet Chew 1,000-1,500 mg by mouth daily as needed for indigestion or heartburn.    [provider]  ?cinacalcet (SENSIPAR) 60 MG tablet Take 60 mg by mouth daily.    [provider]  ?ferric citrate (AURYXIA) 1 GM 210 MG(Fe) tablet Take 420-840 mg by mouth See admin instructions. Take 4 tablets (840 mg) by mouth with each meal & take 2 tablets (420 mg) by mouth with each snack.    [provider]  ?gabapentin (NEURONTIN) 100 MG capsule Take 1 capsule (100 mg total) by mouth 3 (three) times daily. 05/14/21   Bonnielee Haff, MD  ?glucose blood (ACCU-CHEK GUIDE) test strip 1 each by Other route 2 (two) times  daily. Use as instructed 05/26/21   Eppie Gibson, MD  ?heparin 1000 unit/mL SOLN injection Heparin Sodium (Porcine) 1,000 Units/mL Systemic 08/15/20 08/14/21  [provider]  ?insulin glargine (LANTUS) 100 UNIT/ML injection Inject 0.08 mLs (8 Units total) into the skin daily. ?Patient taking differently: Inject 10 Units into the skin 2 (two) times daily. 05/14/21   Bonnielee Haff, MD  ?levothyroxine (SYNTHROID) 75 MCG tablet TAKE 1 TABLET BY MOUTH ONCE DAILY BEFORE BREAKFAST ?Patient taking differently: Take 150 mcg by mouth daily before breakfast. 06/16/21   Dameron, Luna Fuse, DO  ?midodrine (PROAMATINE) 5 MG tablet Take 1 tablet (5 mg total) by mouth 3 (three) times daily with meals. 05/14/21   Bonnielee Haff, MD  ?multivitamin (RENA-VIT) TABS tablet Take 1 tablet by mouth at bedtime. 10/25/16   Nicolette Bang, MD  ?oxyCODONE-acetaminophen (PERCOCET) 7.5-325 MG tablet Take 1 tablet by mouth every 6 (six) hours as needed for severe pain or moderate pain. 07/02/21   [name], PA-C  ?polyethylene glycol (MIRALAX / GLYCOLAX) 17 g packet Take 17 g by mouth daily as needed. ?Patient not taking: Reported on 06/18/2021 05/14/21   Bonnielee Haff, MD  ?rosuvastatin (CRESTOR) 20 MG tablet TAKE 2 TABLETS BY MOUTH ONCE DAILY . APPOINTMENT REQUIRED FOR FUTURE REFILLS 06/16/21   Orvis Brill, DO  ?Syringe, Disposable, (B-D SYRINGE SLIP TIP 30CC) 30 ML MISC 1 Syringe  by Does not apply route daily. 01/31/19   Wilber Oliphant, MD  ?   ? ?Allergies    ?Hydralazine hcl   ? ?Review of Systems   ?Review of Systems  ?Constitutional:  Negative for fever.  ?Respiratory:  Negative for shortness of breath.   ?Cardiovascular:  Negative for leg swelling.  ?Gastrointestinal:  Negative for vomiting.  ?Neurological:  Negative for weakness.  ? ?Physical Exam ?Updated Vital Signs ?BP 125/79 (BP Location: Right Arm)   Pulse 73   Temp 98.3 ?F (36.8 ?C) (Oral)   Resp 18   SpO2 99%  ?Physical Exam ?Vitals and nursing note  reviewed.  ?Constitutional:   ?   Appearance: Normal appearance. He is well-developed.  ?HENT:  ?   Head: Atraumatic.  ?   Nose: Nose normal.  ?   Mouth/Throat:  ?   Mouth: Mucous membranes are moist.  ?Eyes:  ?   General: No scleral icterus. ?   Conjunctiva/sclera: Conjunctivae normal.  ?Neck:  ?   Trachea: No tracheal deviation.  ?Cardiovascular:  ?   Rate and Rhythm: Normal rate.  ?   Pulses: Normal pulses.  ?Pulmonary:  ?   Effort: Pulmonary effort is normal. No accessory muscle usage or respiratory distress.  ?   Breath sounds: Normal breath sounds.  ?   Comments: HD cath right chest without sign of infection.  ?Abdominal:  ?   General: There is no distension.  ?   Tenderness: There is no abdominal tenderness.  ?Genitourinary: ?   Comments: No cva tenderness. ?Musculoskeletal:     ?   General: No swelling.  ?   Cervical back: Neck supple.  ?   Right lower leg: No edema.  ?   Left lower leg: No edema.  ?Skin: ?   General: Skin is warm and dry.  ?   Findings: No rash.  ?Neurological:  ?   Mental Status: He is alert.  ?   Comments: Alert, speech clear.   ?Psychiatric:     ?   Mood and Affect: Mood normal.  ? ? ?ED Results / Procedures / Treatments   ?Labs ?(all labs ordered are listed, but only abnormal results are displayed) ?Results for orders placed or performed during the hospital encounter of 07/24/21  ?Resp Panel by RT-PCR (Flu A&B, Covid) Nasopharyngeal Swab  ? Specimen: Nasopharyngeal Swab; Nasopharyngeal(NP) swabs in vial transport medium  ?Result Value Ref Range  ? SARS Coronavirus 2 by RT PCR NEGATIVE NEGATIVE  ? Influenza A by PCR NEGATIVE NEGATIVE  ? Influenza B by PCR NEGATIVE NEGATIVE  ?Basic metabolic panel  ?Result Value Ref Range  ? Sodium 138 135 - 145 mmol/L  ? Potassium 4.0 3.5 - 5.1 mmol/L  ? Chloride 96 (L) 98 - 111 mmol/L  ? CO2 23 22 - 32 mmol/L  ? Glucose, Bld 101 (H) 70 - 99 mg/dL  ? BUN 54 (H) 6 - 20 mg/dL  ? Creatinine, Ser 16.43 (H) 0.61 - 1.24 mg/dL  ? Calcium 8.4 (L) 8.9 - 10.3  mg/dL  ? GFR, Estimated 4 (L) >60 mL/min  ? Anion gap 19 (H) 5 - 15  ?CBC  ?Result Value Ref Range  ? WBC 9.2 4.0 - 10.5 K/uL  ? RBC 4.87 4.22 - 5.81 MIL/uL  ? Hemoglobin 13.2 13.0 - 17.0 g/dL  ? HCT 41.8 39.0 - 52.0 %  ? MCV 85.8 80.0 - 100.0 fL  ? MCH 27.1 26.0 - 34.0 pg  ? MCHC 31.6  30.0 - 36.0 g/dL  ? RDW 18.6 (H) 11.5 - 15.5 %  ? Platelets 193 150 - 400 K/uL  ? nRBC 0.0 0.0 - 0.2 %  ? ? ? ?EKG ?None ? ?Radiology ?No results found. ? ?Procedures ?Procedures  ? ? ?Medications Ordered in ED ?Medications - No data to display ? ?ED Course/ Medical Decision Making/ A&P ?  ?                        ?Medical Decision Making ?Problems Addressed: ?Complication of vascular access for dialysis, initial encounter: acute illness or injury that poses a threat to life or bodily functions ?ESRD on dialysis [facility]): chronic illness or injury with exacerbation, progression, or side effects of treatment ? ?Amount and/or Complexity of Data Reviewed ?External Data Reviewed: notes. ?Labs: ordered. Decision-making details documented in ED Course. ? ?Risk ?Prescription drug management. ? ? ?Labs sent.  ? ?Reviewed nursing notes and prior charts for additional history. External reports reviewed. Additional hx family member.  ? ?Labs reviewed/interpreted by me - k normal.  ? ?Iv team consulted for line eval/de-clogging. ? ?IV RN had instilled alteplase in line and indicates now working well/normally. ? ?Pt currently appears stable for d/c.  To go to his HD center now. ? ?Pt remains asymptomatic.  ? ? ? ? ? ? ? ? ? ?Final Clinical Impression(s) / ED Diagnoses ?Final diagnoses:  ?None  ? ? ?Rx / DC Orders ?ED Discharge Orders   ? ? None  ? ?  ? ? ?  ?Lajean Saver, MD ?07/24/21 1127 ? ?

## 2021-07-26 ENCOUNTER — Other Ambulatory Visit: Payer: Self-pay

## 2021-07-26 ENCOUNTER — Telehealth: Payer: Self-pay

## 2021-07-26 ENCOUNTER — Encounter: Payer: Medicaid Other | Admitting: Orthopedic Surgery

## 2021-07-26 DIAGNOSIS — E1129 Type 2 diabetes mellitus with other diabetic kidney complication: Secondary | ICD-10-CM

## 2021-07-26 MED ORDER — INSULIN GLARGINE 100 UNIT/ML ~~LOC~~ SOLN: 10.0000 [IU] | Freq: Two times a day (BID) | SUBCUTANEOUS | 2 refills | Status: DC

## 2021-07-26 NOTE — Telephone Encounter (Signed)
Transition Care Management Follow-up Telephone Call ?Date of discharge and from where: 07/24/2021 from [facility] ?How have you been since you were released from the hospital? Patient stated that he is feeling well and did not have any questions or concerns at this time.  ?Any questions or concerns? No ? ?Items Reviewed: ?Did the pt receive and understand the discharge instructions provided? Yes  ?Medications obtained and verified? Yes  ?Other? No  ?Any new allergies since your discharge? No  ?Dietary orders reviewed? No ?Do you have support at home? Yes  ? ?Functional Questionnaire: (I = Independent and D = Dependent) ?ADLs: I ? ?Bathing/Dressing- I ? ?Meal Prep- I ? ?Eating- I ? ?Maintaining continence- I ? ?Transferring/Ambulation- I ? ?Managing Meds- I ? ? ?Follow up appointments reviewed: ? ?PCP Hospital f/u appt confirmed? No   ?Specialist Hospital f/u appt confirmed? Yes  Scheduled to see Bobby Rumpf, NP on 07/28/2021 @ 09:00am. ?Are transportation arrangements needed? No  ?If their condition worsens, is the pt aware to call PCP or go to the Emergency Dept.? Yes ?Was the patient provided with contact information for the PCP's office or ED? Yes ?Was to pt encouraged to call back with questions or concerns? Yes ? ?

## 2021-07-27 ENCOUNTER — Other Ambulatory Visit (HOSPITAL_COMMUNITY): Payer: Self-pay

## 2021-07-27 ENCOUNTER — Other Ambulatory Visit: Payer: Self-pay

## 2021-07-27 ENCOUNTER — Telehealth: Payer: Self-pay

## 2021-07-27 DIAGNOSIS — N186 End stage renal disease: Secondary | ICD-10-CM

## 2021-07-27 NOTE — Telephone Encounter (Signed)
Fuller Mandril, PA at HD center called to move up pt's post op appt due to no bruit and possibly being clotted. Pt has been moved up to tomorrow with MD and study beforehand. Pt is aware of this appt.  ?

## 2021-07-28 ENCOUNTER — Other Ambulatory Visit: Payer: Self-pay

## 2021-07-28 ENCOUNTER — Ambulatory Visit (HOSPITAL_COMMUNITY)
Admission: RE | Admit: 2021-07-28 | Discharge: 2021-07-28 | Disposition: A | Discharge status: Home / Self Care | Payer: Medicaid Other | Source: Ambulatory Visit | Attending: Vascular Surgery | Admitting: Vascular Surgery

## 2021-07-28 ENCOUNTER — Encounter: Payer: Self-pay | Admitting: Family

## 2021-07-28 ENCOUNTER — Encounter: Payer: Self-pay | Admitting: Vascular Surgery

## 2021-07-28 ENCOUNTER — Ambulatory Visit (INDEPENDENT_AMBULATORY_CARE_PROVIDER_SITE_OTHER): Payer: Medicaid Other | Admitting: Family

## 2021-07-28 ENCOUNTER — Ambulatory Visit (INDEPENDENT_AMBULATORY_CARE_PROVIDER_SITE_OTHER): Payer: Medicaid Other | Admitting: Vascular Surgery

## 2021-07-28 VITALS — BP 103/68 | HR 102 | Temp 98.0°F | Resp 20 | Ht 74.0 in | Wt 324.0 lb

## 2021-07-28 DIAGNOSIS — Z992 Dependence on renal dialysis: Secondary | ICD-10-CM | POA: Insufficient documentation

## 2021-07-28 DIAGNOSIS — N186 End stage renal disease: Secondary | ICD-10-CM | POA: Diagnosis present

## 2021-07-28 DIAGNOSIS — Z89432 Acquired absence of left foot: Secondary | ICD-10-CM

## 2021-07-28 NOTE — H&P (View-Only) (Signed)
? ?Patient ID: Paul [name], male   DOB: 28-Aug-1990, 31 y.o.   MRN: [id number] ? ?Reason for Consult: Routine Post Op ?  ?Referred by Orvis Brill, DO ? ?Subjective:  ?   ?HPI: ? ?Paul [name] is a 31 y.o. male follows up from recent left upper extremity basilic vein fistula creation.  He does not have any new hand symptoms.  There was some concern that they could not hear the fistula at dialysis so he is here earlier than his previously scheduled follow-up.  He continues on Eliquis. ? ?Past Medical History:  ?Diagnosis Date  ? Acute hypoxemic respiratory failure ([place]) 09/11/2016  ? Anemia   ? Chronic kidney disease   ? ARF on CRF Dialysis T/TH/Sa  ? Diabetes mellitus   ? Type II  ? End stage renal disease on dialysis [facility])   ? [place]   ? GERD (gastroesophageal reflux disease)   ? diet controlled  ? HLD (hyperlipidemia)   ? Hypertension   ? Hypothyroidism   ? Morbid obesity ([place])   ? Sacral wound   ? resolved  ? Thyroid disease   ? ?Family History  ?Problem Relation Age of Onset  ? Heart disease Mother   ? Hypertension Mother   ? ?Past Surgical History:  ?Procedure Laterality Date  ? AMPUTATION Left 05/08/2021  ? Procedure: AMPUTATION GREAT TOE;  Surgeon: Newt Minion, MD;  Location: [facility];  Service: Orthopedics;  Laterality: Left;  ? AV FISTULA PLACEMENT Left 09/26/2016  ? Procedure: LEFT UPPER ARM ARTERIOVENOUS (AV) FISTULA CREATION;  Surgeon: Elam Dutch, MD;  [facility];  Service: Vascular;  Laterality: Left;  ? AV FISTULA PLACEMENT Left 12/02/2016  ? Procedure: INSERTION OF ARTERIOVENOUS GORE-TEX GRAFT LEFT UPPER  ARM USING A 4-7MM BY 45CM GRAFT ;  Surgeon: Rosetta Posner, MD;  Location: [facility] OR;  Service: Vascular;  Laterality: Left;  ? AV FISTULA PLACEMENT Right 07/03/2020  ? Procedure: INSERTION OF ARTERIOVENOUS (AV) GORE-TEX GRAFT RIGHT UPPER ARM;  Surgeon: Marty Heck, MD;  Location: [place];  Service: Vascular;  Laterality: Right;  ? AV FISTULA PLACEMENT Left 07/02/2021  ?  Procedure: LEFT UPPER EXTREMITY ARTERIOVENOUS FISTULA CREATION;  Surgeon: Waynetta Sandy, MD;  Location: [place];  Service: Vascular;  Laterality: Left;  ? BASCILIC VEIN TRANSPOSITION Right 05/06/2020  ? Procedure: FIRST STAGE RIGHT BASCILIC VEIN TRANSPOSITION;  Surgeon: Serafina Mitchell, MD;  Location: [facility] OR;  Service: Vascular;  Laterality: Right;  ? EXCHANGE OF A DIALYSIS CATHETER Right 10/08/2016  ? Procedure: EXCHANGE OF A DIALYSIS CATHETER-RIGHT INTERNAL JUGULAR;  Surgeon: Conrad Carson, MD;  Location: [place];  Service: Vascular;  Laterality: Right;  ? INSERTION OF DIALYSIS CATHETER Right 09/26/2016  ? Procedure: INSERTION OF DIALYSIS CATHETER - Right Internal Jugular Placement;  Surgeon: Elam Dutch, MD;  Location: [place];  Service: Vascular;  Laterality: Right;  ? INSERTION OF DIALYSIS CATHETER Left 03/22/2018  ? Procedure: INSERTION OF DIALYSIS CATHETER;  Surgeon: Marty Heck, MD;  Location: [place];  Service: Vascular;  Laterality: Left;  ? IR FLUORO GUIDE CV LINE RIGHT  03/26/2021  ? IR FLUORO GUIDE CV LINE RIGHT  05/13/2021  ? IR REMOVAL TUN CV CATH W/O FL  05/11/2021  ? IR US GUIDE VASC ACCESS RIGHT  03/26/2021  ? IR US GUIDE VASC ACCESS RIGHT  05/13/2021  ? REVISON OF ARTERIOVENOUS FISTULA Left 03/22/2018  ? Procedure: LEFT UPPER EXTREMITY ARTERIOVENOUS  REVISION WITH GORE-TEX GRAFT.;  Surgeon:  Marty Heck, MD;  Location: [place];  Service: Vascular;  Laterality: Left;  ? TEE WITHOUT CARDIOVERSION N/A 05/12/2021  ? Procedure: TRANSESOPHAGEAL ECHOCARDIOGRAM (TEE);  Surgeon: Werner Lean, MD;  Location: [place];  Service: Cardiovascular;  Laterality: N/A;  ? THROMBECTOMY Left 08/26/2019  ? thrombectomy of LUA loop AVG  ? THROMBECTOMY AND REVISION OF ARTERIOVENTOUS (AV) GORETEX  GRAFT Right 07/27/2020  ? Procedure: INSERTION OF RIGHT ARM LOOP GRAFT WITH EXCISION OF RIGHT ARM BRACHIAL AXILLARY GRAFT;  Surgeon: Marty Heck, MD;  Location: [place];  Service:  Vascular;  Laterality: Right;  ? UPPER EXTREMITY VENOGRAPHY Bilateral 06/14/2021  ? Procedure: UPPER EXTREMITY VENOGRAPHY;  Surgeon: Waynetta Sandy, MD;  Location: [place] CV [facility];  Service: Cardiovascular;  Laterality: Bilateral;  ? VISCERAL ANGIOGRAPHY Right 06/17/2020  ? Procedure: CENTRAL VENO;  Surgeon: Cherre Robins, MD;  Location: [place] [facility];  Service: Cardiovascular;  Laterality: Right;  ? ? ?Short Social History:  ?Social History  ? ?Tobacco Use  ? Smoking status: Never  ? Smokeless tobacco: Never  ?Substance Use Topics  ? Alcohol use: No  ? ? ?Allergies  ?Allergen Reactions  ? Hydralazine Hcl Other (See Comments)  ?  DRUG-INDUCED LUPUS  ? ? ?Current Outpatient Medications  ?Medication Sig Dispense Refill  ? Accu-Chek Softclix Lancets lancets Use as instructed 100 each 12  ? acetaminophen (TYLENOL) 500 MG tablet Take 1,000 mg by mouth every 6 (six) hours as needed (for pain.).    ? apixaban (ELIQUIS) 5 MG TABS tablet Take 1 tablet (5 mg total) by mouth 2 (two) times daily. START TAKING ONLY AFTER STARTER PACK HAS BEEN COMPLETED 60 tablet 2  ? Blood Glucose Monitoring Suppl (ACCU-CHEK GUIDE ME) w/Device KIT 1 kit by Does not apply route daily. 1 kit 0  ? calcium carbonate (TUMS - DOSED IN MG ELEMENTAL CALCIUM) 500 MG chewable tablet Chew 1,000-1,500 mg by mouth daily as needed for indigestion or heartburn.    ? cinacalcet (SENSIPAR) 60 MG tablet Take 60 mg by mouth daily.    ? ferric citrate (AURYXIA) 1 GM 210 MG(Fe) tablet Take 420-840 mg by mouth See admin instructions. Take 4 tablets (840 mg) by mouth with each meal & take 2 tablets (420 mg) by mouth with each snack.    ? gabapentin (NEURONTIN) 100 MG capsule Take 1 capsule (100 mg total) by mouth 3 (three) times daily. 90 capsule 1  ? glucose blood (ACCU-CHEK GUIDE) test strip 1 each by Other route 2 (two) times daily. Use as instructed 100 each 12  ? heparin 1000 unit/mL SOLN injection Heparin Sodium (Porcine) 1,000 Units/mL  Systemic    ? insulin glargine (LANTUS) 100 UNIT/ML injection Inject 0.1 mLs (10 Units total) into the skin 2 (two) times daily. 10 mL 2  ? levothyroxine (SYNTHROID) 75 MCG tablet TAKE 1 TABLET BY MOUTH ONCE DAILY BEFORE BREAKFAST (Patient taking differently: Take 150 mcg by mouth daily before breakfast.) 30 tablet 0  ? midodrine (PROAMATINE) 5 MG tablet Take 1 tablet (5 mg total) by mouth 3 (three) times daily with meals. 90 tablet 2  ? multivitamin (RENA-VIT) TABS tablet Take 1 tablet by mouth at bedtime. 30 tablet 0  ? oxyCODONE-acetaminophen (PERCOCET) 7.5-325 MG tablet Take 1 tablet by mouth every 6 (six) hours as needed for severe pain or moderate pain. 12 tablet 0  ? polyethylene glycol (MIRALAX / GLYCOLAX) 17 g packet Take 17 g by mouth daily as needed. 14 each 0  ?  rosuvastatin (CRESTOR) 20 MG tablet TAKE 2 TABLETS BY MOUTH ONCE DAILY . APPOINTMENT REQUIRED FOR FUTURE REFILLS 90 tablet 0  ? Syringe, Disposable, (B-D SYRINGE SLIP TIP 30CC) 30 ML MISC 1 Syringe by Does not apply route daily. 30 each 11  ? ?No current facility-administered medications for this visit.  ? ? ?REVIEW OF SYSTEMS  ?No complaints today ?   ?Objective:  ?Objective  ? ?Vitals:  ? 07/28/21 1127  ?BP: 103/68  ?Pulse: (!) 102  ?Resp: 20  ?Temp: 98 ?F (36.7 ?C)  ?SpO2: 97%  ?Weight: (!) 324 lb (147 kg)  ?Height: 6' 2"  (1.88 m)  ? ?Body mass index is 41.6 kg/m?. ? ?Physical Exam ?Awake alert oriented ?Nonlabored respirations ?Left upper extremity incision well-healed ?I can trace fistula in the upper arm with Doppler but cannot feel it likely due to the depth ? ?Data: ?AVF                 PSV (cm/s)Flow Vol (mL/min)Comments  ?+--------------------+----------+-----------------+--------+  ?Native artery inflow   160           528                 ?+--------------------+----------+-----------------+--------+  ?AVF Anastomosis        209                               ?+--------------------+----------+-----------------+--------+   ? ?   ?+------------+----------+-------------+----------+--------------------+  ?OUTFLOW VEINPSV (cm/s)Diameter (cm)Depth (cm)      Describe        ?+------------+----------+-------------+----------+--------------------+  ?Prox UA

## 2021-07-28 NOTE — Progress Notes (Signed)
Office Visit Note   Patient: Paul [name]           Date of Birth: 22-Jul-1990           MRN: [id number] Visit Date: 07/28/2021              Requested by: Orvis Brill, DO [address] [place],  [place] [address] PCP: Orvis Brill, DO  Chief Complaint  Patient presents with   Left Foot - Routine Post Op    05/08/21 1st ray amputation      HPI: Patient is a 31 year old gentleman who is status post left foot first ray amputation.  Patient denies any problems   Assessment & Plan: Visit Diagnoses:  No diagnosis found.   Plan: The incision is well-healed. Debrided of nonviable tissue with 10 blade. Patient would like to follow up in office in 4 weeks to eval for debridement and foot check.   Follow-Up Instructions: Return in about 4 weeks (around 08/25/2021).   Ortho Exam  Patient is alert, oriented, no adenopathy, well-dressed, normal affect, normal respiratory effort. Examination the incision is well healed. Significant callus and nonviable tissue. Debrided with 10 blade. No open area. No drainage.  there is no redness cellulitis.  Imaging: VAS US DUPLEX DIALYSIS ACCESS (AVF,AVG)  Result Date: 07/28/2021 DIALYSIS ACCESS Patient Name:  Paul [name]  Date of Exam:   07/28/2021 Medical Rec #: [id number]      Accession #:    [id number] Date of Birth: Mar 14, 1991       Patient Gender: M Patient Age:   33 years Exam Location[name] Procedure:      VAS US DUPLEX DIALYSIS ACCESS (AVF, AVG) Referring Phys: [name] --------------------------------------------------------------------------------  Reason for Exam: No palpable thrill for AVF/AVG. Access Site: Left Upper Extremity. Access Type: Brachiobasilic 4/65/0354. Limitations: Vessel Depth Performing Technologist: Ronal Fear RVS, RCS  Examination Guidelines: A complete evaluation includes B-mode imaging, spectral Doppler, color Doppler, and power Doppler as needed of all accessible portions of each  vessel. Unilateral testing is considered an integral part of a complete examination. Limited examinations for reoccurring indications may be performed as noted.  Findings: +--------------------+----------+-----------------+--------+  AVF                  PSV (cm/s) Flow Vol (mL/min) Comments  +--------------------+----------+-----------------+--------+  Native artery inflow    160            528                  +--------------------+----------+-----------------+--------+  AVF Anastomosis         209                                 +--------------------+----------+-----------------+--------+  +------------+----------+-------------+----------+--------------------+  OUTFLOW VEIN PSV (cm/s) Diameter (cm) Depth (cm)       Describe        +------------+----------+-------------+----------+--------------------+  Prox UA          99         0.33         3.73                          +------------+----------+-------------+----------+--------------------+  Mid UA          102         0.57         3.11  competing branch    +------------+----------+-------------+----------+--------------------+  Dist UA         367         0.66         3.08    turbulent color flow  +------------+----------+-------------+----------+--------------------+   Summary: Patent arteriovenous fistula with one competing branch observed.  Turbulent color flow observed in the distal upper arm segment of the basilic outflow vein; however, disease is not appreciated and may be due to limited 2D imaging due to vessel depth. *See table(s) above for measurements and observations.  Diagnosing physician: [name] MD Electronically signed by [name] MD on 07/28/2021 at 11:52:27 AM.    --------------------------------------------------------------------------------   Final    No images are attached to the encounter.  Labs: Lab Results  Component Value Date   HGBA1C 6.0 (H) 05/07/2021   HGBA1C 5.8 03/17/2021   HGBA1C 6.1 09/18/2019   REPTSTATUS  05/13/2021 FINAL 05/08/2021   GRAMSTAIN  05/08/2021    FEW WBC PRESENT, PREDOMINANTLY PMN FEW GRAM POSITIVE COCCI    CULT  05/08/2021    MODERATE STAPHYLOCOCCUS AUREUS NO ANAEROBES ISOLATED Performed at [facility], [place] [address] [place], [place] [address]    LABORGA STAPHYLOCOCCUS AUREUS 05/08/2021     Lab Results  Component Value Date   ALBUMIN 3.3 (L) 05/14/2021   ALBUMIN 2.9 (L) 05/11/2021   ALBUMIN 4.3 05/06/2021    Lab Results  Component Value Date   MG 2.8 (H) 09/26/2016   MG 2.8 (H) 09/25/2016   MG 2.8 (H) 09/24/2016   Lab Results  Component Value Date   VD25OH 6.8 (L) 09/14/2016    No results found for: PREALBUMIN CBC EXTENDED Latest Ref Rng & Units 07/24/2021 07/02/2021 06/14/2021  WBC 4.0 - 10.5 K/uL 9.2 - -  RBC 4.22 - 5.81 MIL/uL 4.87 - -  HGB 13.0 - 17.0 g/dL 13.2 16.3 14.3  HCT 39.0 - 52.0 % 41.8 48.0 42.0  PLT 150 - 400 K/uL 193 - -  NEUTROABS 1.7 - 7.7 K/uL - - -  LYMPHSABS 0.7 - 4.0 K/uL - - -     There is no height or weight on file to calculate BMI.  Orders:  No orders of the defined types were placed in this encounter.  No orders of the defined types were placed in this encounter.    Procedures: No procedures performed  Clinical Data: No additional findings.  ROS:  All other systems negative, except as noted in the HPI. Review of Systems  Objective: Vital Signs: There were no vitals taken for this visit.  Specialty Comments:  No specialty comments available.  PMFS History: Patient Active Problem List   Diagnosis Date Noted   Diabetic foot infection ([place])    Wound infection 05/07/2021   MSSA bacteremia 05/07/2021   Osteomyelitis ([place]) 05/07/2021   Abscess 05/07/2021   Sepsis due to cellulitis ([place]) 05/06/2021   Poor compliance with medication 03/17/2021   Fluid overload, unspecified 56/38/9373   Complication of vascular dialysis catheter 03/23/2020   Other disorders of phosphorus metabolism 01/28/2020    Hypertension 01/08/2020   Hypocalcemia 10/29/2019   ESRD on dialysis ([place]) 11/16/2017   Pruritus, unspecified 04/04/2017   Unspecified protein-calorie malnutrition ([place]) 11/11/2016   Headache, unspecified 10/29/2016   Encounter for immunization 10/19/2016   Iron deficiency anemia, unspecified 10/10/2016   Anemia in chronic kidney disease 09/30/2016   Coagulation defect, unspecified ([place]) 09/30/2016   Pneumonia due to Pseudomonas ([place]) 09/30/2016   Secondary hyperparathyroidism of  renal origin ([place]) 09/30/2016   Hypothyroidism 10/29/2009   Type 2 diabetes mellitus ([place]) 04/14/2009   Hyperlipidemia 04/14/2009   OBESITY, MORBID 04/14/2009   Essential hypertension, benign 04/14/2009   Past Medical History:  Diagnosis Date   Acute hypoxemic respiratory failure ([place]) 09/11/2016   Anemia    Chronic kidney disease    ARF on CRF Dialysis T/TH/Sa   Diabetes mellitus    Type II   End stage renal disease on dialysis ([place])    [place]    GERD (gastroesophageal reflux disease)    diet controlled   HLD (hyperlipidemia)    Hypertension    Hypothyroidism    Morbid obesity ([place])    Sacral wound    resolved   Thyroid disease     Family History  Problem Relation Age of Onset   Heart disease Mother    Hypertension Mother     Past Surgical History:  Procedure Laterality Date   AMPUTATION Left 05/08/2021   Procedure: AMPUTATION GREAT TOE;  Surgeon: Newt Minion, MD;  Location: [facility];  Service: Orthopedics;  Laterality: Left;   AV FISTULA PLACEMENT Left 09/26/2016   Procedure: LEFT UPPER ARM ARTERIOVENOUS (AV) FISTULA CREATION;  Surgeon: Elam Dutch, MD;  Location: [facility];  Service: Vascular;  Laterality: Left;   AV FISTULA PLACEMENT Left 12/02/2016   Procedure: INSERTION OF ARTERIOVENOUS GORE-TEX GRAFT LEFT UPPER  ARM USING A 4-7MM BY 45CM GRAFT ;  Surgeon: Rosetta Posner, MD;  Location: [facility];  Service: Vascular;  Laterality: Left;   AV FISTULA PLACEMENT Right 07/03/2020    Procedure: INSERTION OF ARTERIOVENOUS (AV) GORE-TEX GRAFT RIGHT UPPER ARM;  Surgeon: Marty Heck, MD;  Location: [place];  Service: Vascular;  Laterality: Right;   AV FISTULA PLACEMENT Left 07/02/2021   Procedure: LEFT UPPER EXTREMITY ARTERIOVENOUS FISTULA CREATION;  Surgeon: Waynetta Sandy, MD;  Location: [facility];  Service: Vascular;  Laterality: Left;   [place] Right 05/06/2020   Procedure: FIRST STAGE RIGHT [place];  Surgeon: Serafina Mitchell, MD;  Location: [facility];  Service: Vascular;  Laterality: Right;   EXCHANGE OF A DIALYSIS CATHETER Right 10/08/2016   Procedure: EXCHANGE OF A DIALYSIS CATHETER-RIGHT INTERNAL JUGULAR;  Surgeon: Conrad Bartlett, MD;  Location: [facility] [place];  Service: Vascular;  Laterality: Right;   INSERTION OF DIALYSIS CATHETER Right 09/26/2016   Procedure: INSERTION OF DIALYSIS CATHETER - Right Internal Jugular Placement;  Surgeon: Elam Dutch, MD;  Location: [facility];  Service: Vascular;  Laterality: Right;   INSERTION OF DIALYSIS CATHETER Left 03/22/2018   Procedure: INSERTION OF DIALYSIS CATHETER;  Surgeon: Marty Heck, MD;  Location: [facility];  Service: Vascular;  Laterality: Left;   IR FLUORO GUIDE CV LINE RIGHT  03/26/2021   IR FLUORO GUIDE CV LINE RIGHT  05/13/2021   IR REMOVAL TUN CV CATH W/O FL  05/11/2021   IR US GUIDE VASC ACCESS RIGHT  03/26/2021   IR US GUIDE VASC ACCESS RIGHT  05/13/2021   REVISON OF ARTERIOVENOUS FISTULA Left 03/22/2018   Procedure: LEFT UPPER EXTREMITY ARTERIOVENOUS  REVISION WITH GORE-TEX GRAFT.;  Surgeon: Marty Heck, MD;  Location: [facility];  Service: Vascular;  Laterality: Left;   TEE WITHOUT CARDIOVERSION N/A 05/12/2021   Procedure: TRANSESOPHAGEAL ECHOCARDIOGRAM (TEE);  Surgeon: Werner Lean, MD;  [facility];  Service: Cardiovascular;  Laterality: N/A;   THROMBECTOMY Left 08/26/2019   thrombectomy of LUA loop AVG   THROMBECTOMY AND REVISION  OF  ARTERIOVENTOUS (AV) GORETEX  GRAFT Right 07/27/2020   Procedure: INSERTION OF RIGHT ARM LOOP GRAFT WITH EXCISION OF RIGHT ARM BRACHIAL AXILLARY GRAFT;  Surgeon: Marty Heck, MD;  Location: [facility];  Service: Vascular;  Laterality: Right;   UPPER EXTREMITY VENOGRAPHY Bilateral 06/14/2021   Procedure: UPPER EXTREMITY VENOGRAPHY;  Surgeon: Waynetta Sandy, MD;  Location: [place] [facility];  Service: Cardiovascular;  Laterality: Bilateral;   VISCERAL ANGIOGRAPHY Right 06/17/2020   Procedure: CENTRAL VENO;  Surgeon: Cherre Robins, MD;  Location: [facility];  Service: Cardiovascular;  Laterality: Right;   Social History   Occupational History   Not on file  Tobacco Use   Smoking status: Never   Smokeless tobacco: Never  Vaping Use   Vaping Use: Never used  Substance and Sexual Activity   Alcohol use: No   Drug use: Yes    Frequency: 2.0 times per week    Types: Marijuana    Comment: Last time 07/23/20   Sexual activity: Not on file

## 2021-07-28 NOTE — Progress Notes (Signed)
Patient ID: Paul [name], male   DOB: 04-09-1991, 30 y.o.   MRN: [id number]  Reason for Consult: Routine Post Op   Referred by Orvis Brill, DO  Subjective:     HPI:  Paul [name] is a 31 y.o. male follows up from recent left upper extremity basilic vein fistula creation.  He does not have any new hand symptoms.  There was some concern that they could not hear the fistula at dialysis so he is here earlier than his previously scheduled follow-up.  He continues on Eliquis.  Past Medical History:  Diagnosis Date   Acute hypoxemic respiratory failure ([place]) 09/11/2016   Anemia    Chronic kidney disease    ARF on CRF Dialysis T/TH/Sa   Diabetes mellitus    Type II   End stage renal disease on dialysis ([place])    [place]    GERD (gastroesophageal reflux disease)    diet controlled   HLD (hyperlipidemia)    Hypertension    Hypothyroidism    Morbid obesity ([place])    Sacral wound    resolved   Thyroid disease    Family History  Problem Relation Age of Onset   Heart disease Mother    Hypertension Mother    Past Surgical History:  Procedure Laterality Date   AMPUTATION Left 05/08/2021   Procedure: AMPUTATION GREAT TOE;  Surgeon: Newt Minion, MD;  Location: [facility];  Service: Orthopedics;  Laterality: Left;   AV FISTULA PLACEMENT Left 09/26/2016   Procedure: LEFT UPPER ARM ARTERIOVENOUS (AV) FISTULA CREATION;  Surgeon: Elam Dutch, MD;  Location: [facility];  Service: Vascular;  Laterality: Left;   AV FISTULA PLACEMENT Left 12/02/2016   Procedure: INSERTION OF ARTERIOVENOUS GORE-TEX GRAFT LEFT UPPER  ARM USING A 4-7MM BY 45CM GRAFT ;  Surgeon: Rosetta Posner, MD;  Location: [facility];  Service: Vascular;  Laterality: Left;   AV FISTULA PLACEMENT Right 07/03/2020   Procedure: INSERTION OF ARTERIOVENOUS (AV) GORE-TEX GRAFT RIGHT UPPER ARM;  Surgeon: Marty Heck, MD;  Location: [facility];  Service: Vascular;  Laterality: Right;   AV FISTULA PLACEMENT Left 07/02/2021    Procedure: LEFT UPPER EXTREMITY ARTERIOVENOUS FISTULA CREATION;  Surgeon: Waynetta Sandy, MD;  Location: [facility];  Service: Vascular;  Laterality: Left;   [place] Right 05/06/2020   Procedure: FIRST STAGE RIGHT [place];  Surgeon: Serafina Mitchell, MD;  Location: [facility];  Service: Vascular;  Laterality: Right;   EXCHANGE OF A DIALYSIS CATHETER Right 10/08/2016   Procedure: EXCHANGE OF A DIALYSIS CATHETER-RIGHT INTERNAL JUGULAR;  Surgeon: Conrad La Vergne, MD;  Location: [facility];  Service: Vascular;  Laterality: Right;   INSERTION OF DIALYSIS CATHETER Right 09/26/2016   Procedure: INSERTION OF DIALYSIS CATHETER - Right Internal Jugular Placement;  Surgeon: Elam Dutch, MD;  Location: [facility];  Service: Vascular;  Laterality: Right;   INSERTION OF DIALYSIS CATHETER Left 03/22/2018   Procedure: INSERTION OF DIALYSIS CATHETER;  Surgeon: Marty Heck, MD;  Location: [facility];  Service: Vascular;  Laterality: Left;   IR FLUORO GUIDE CV LINE RIGHT  03/26/2021   IR FLUORO GUIDE CV LINE RIGHT  05/13/2021   IR REMOVAL TUN CV CATH W/O FL  05/11/2021   IR US GUIDE VASC ACCESS RIGHT  03/26/2021   IR US GUIDE VASC ACCESS RIGHT  05/13/2021   REVISON OF ARTERIOVENOUS FISTULA Left 03/22/2018   Procedure: LEFT UPPER EXTREMITY ARTERIOVENOUS  REVISION WITH GORE-TEX GRAFT.;  Clark, Christopher J, MD;  [facility];  Service: Vascular;  Laterality: Left;  ° TEE WITHOUT CARDIOVERSION N/A 05/12/2021  ° Procedure: TRANSESOPHAGEAL ECHOCARDIOGRAM (TEE);  Surgeon: Chandrasekhar, Mahesh A, MD;  [facility]: [facility];  Service: Cardiovascular;  Laterality: N/A;  ° THROMBECTOMY Left 08/26/2019  ° thrombectomy of LUA loop AVG  ° THROMBECTOMY AND REVISION OF ARTERIOVENTOUS (AV) GORETEX  GRAFT Right 07/27/2020  ° Procedure: INSERTION OF RIGHT ARM LOOP GRAFT WITH EXCISION OF RIGHT ARM BRACHIAL AXILLARY GRAFT;  Surgeon: Clark, Christopher J, MD;  [facility];  Service:  Vascular;  Laterality: Right;  ° UPPER EXTREMITY VENOGRAPHY Bilateral 06/14/2021  ° Procedure: UPPER EXTREMITY VENOGRAPHY;  Surgeon: Cain, Brandon Christopher, MD;  [facility];  Service: Cardiovascular;  Laterality: Bilateral;  ° VISCERAL ANGIOGRAPHY Right 06/17/2020  ° Procedure: CENTRAL VENO;  Surgeon: Hawken, Thomas N, MD;  [facility];  Service: Cardiovascular;  Laterality: Right;  ° ° °Short Social History:  °Social History  ° °Tobacco Use  ° Smoking status: Never  ° Smokeless tobacco: Never  °Substance Use Topics  ° Alcohol use: No  ° ° °Allergies  °Allergen Reactions  ° Hydralazine Hcl Other (See Comments)  °  DRUG-INDUCED LUPUS  ° ° °Current Outpatient Medications  °Medication Sig Dispense Refill  ° Accu-Chek Softclix Lancets lancets Use as instructed 100 each 12  ° acetaminophen (TYLENOL) 500 MG tablet Take 1,000 mg by mouth every 6 (six) hours as needed (for pain.).    ° apixaban (ELIQUIS) 5 MG TABS tablet Take 1 tablet (5 mg total) by mouth 2 (two) times daily. START TAKING ONLY AFTER STARTER PACK HAS BEEN COMPLETED 60 tablet 2  ° Blood Glucose Monitoring Suppl (ACCU-CHEK GUIDE ME) w/Device KIT 1 kit by Does not apply route daily. 1 kit 0  ° calcium carbonate (TUMS - DOSED IN MG ELEMENTAL CALCIUM) 500 MG chewable tablet Chew 1,000-1,500 mg by mouth daily as needed for indigestion or heartburn.    ° cinacalcet (SENSIPAR) 60 MG tablet Take 60 mg by mouth daily.    ° ferric citrate (AURYXIA) 1 GM 210 MG(Fe) tablet Take 420-840 mg by mouth See admin instructions. Take 4 tablets (840 mg) by mouth with each meal & take 2 tablets (420 mg) by mouth with each snack.    ° gabapentin (NEURONTIN) 100 MG capsule Take 1 capsule (100 mg total) by mouth 3 (three) times daily. 90 capsule 1  ° glucose blood (ACCU-CHEK GUIDE) test strip 1 each by Other route 2 (two) times daily. Use as instructed 100 each 12  ° heparin 1000 unit/mL SOLN injection Heparin Sodium (Porcine) 1,000 Units/mL  Systemic    ° insulin glargine (LANTUS) 100 UNIT/ML injection Inject 0.1 mLs (10 Units total) into the skin 2 (two) times daily. 10 mL 2  ° levothyroxine (SYNTHROID) 75 MCG tablet TAKE 1 TABLET BY MOUTH ONCE DAILY BEFORE BREAKFAST (Patient taking differently: Take 150 mcg by mouth daily before breakfast.) 30 tablet 0  ° midodrine (PROAMATINE) 5 MG tablet Take 1 tablet (5 mg total) by mouth 3 (three) times daily with meals. 90 tablet 2  ° multivitamin (RENA-VIT) TABS tablet Take 1 tablet by mouth at bedtime. 30 tablet 0  ° oxyCODONE-acetaminophen (PERCOCET) 7.5-325 MG tablet Take 1 tablet by mouth every 6 (six) hours as needed for severe pain or moderate pain. 12 tablet 0  ° polyethylene glycol (MIRALAX / GLYCOLAX) 17 g packet Take 17 g by mouth daily as needed. 14 each 0  °   rosuvastatin (CRESTOR) 20 MG tablet TAKE 2 TABLETS BY MOUTH ONCE DAILY . APPOINTMENT REQUIRED FOR FUTURE REFILLS 90 tablet 0  ° Syringe, Disposable, (B-D SYRINGE SLIP TIP 30CC) 30 ML MISC 1 Syringe by Does not apply route daily. 30 each 11  ° °No current facility-administered medications for this visit.  ° ° °REVIEW OF SYSTEMS  °No complaints today °   °Objective:  °Objective  ° °Vitals:  ° 07/28/21 1127  °BP: 103/68  °Pulse: (!) 102  °Resp: 20  °Temp: 98 °F (36.7 °C)  °SpO2: 97%  °Weight: (!) 324 lb (147 kg)  °Height: 6' 2" (1.88 m)  ° °Body mass index is 41.6 kg/m². ° °Physical Exam °Awake alert oriented °Nonlabored respirations °Left upper extremity incision well-healed °I can trace fistula in the upper arm with Doppler but cannot feel it likely due to the depth ° °Data: ° AVF                  PSV (cm/s) Flow Vol (mL/min) Comments   °+--------------------+----------+-----------------+--------+  ° Native artery inflow    160            528                   °+--------------------+----------+-----------------+--------+  ° AVF Anastomosis         209                                  °+--------------------+----------+-----------------+--------+   ° °   °+------------+----------+-------------+----------+--------------------+  ° OUTFLOW VEIN PSV (cm/s) Diameter (cm) Depth (cm)       Describe         °+------------+----------+-------------+----------+--------------------+  ° Prox UA          99         0.33         3.73                           °+------------+----------+-------------+----------+--------------------+  ° Mid UA          102         0.57         3.11      competing branch     °+------------+----------+-------------+----------+--------------------+  ° Dist UA         367         0.66         3.08    turbulent color flow   °+------------+----------+-------------+----------+--------------------+  ° °   ° °   °Summary:  °Patent arteriovenous fistula with one competing branch observed.  °   °Turbulent color flow observed in the distal upper arm segment of the  °basilic  °outflow vein; however, disease is not appreciated and may be due to  °limited 2D  °imaging due to vessel depth.  °    °Assessment/Plan:  °  °31-year-old male with end-stage renal disease currently dialyzing via catheter with recent left basilic vein fistula creation.  The fistula is quite deep in his upper arm but is beginning to mature although is somewhat narrowed at the top portion and there is somewhat turbulent flow to towards the distal upper arm.  I discussed with the patient that he may require an interposition graft but that we do have a good ability to get at least an inflow and outflow vein to work even if we have to place an   interposition graft which should be able to keep access in the left arm.  We will allow for another 3 to 4 weeks of fistula maturation and then plan second stage basilic vein fistula versus graft on a nondialysis day in the near future.  He will need to hold Eliquis for 48 hours prior to procedure.  I discussed the risk and benefits as well as the possible outcomes with the patient and his older sister and they demonstrate good understanding. ° °   ° °Brandon Christopher Cain MD °[facility] ° ° °

## 2021-08-05 ENCOUNTER — Other Ambulatory Visit: Payer: Self-pay

## 2021-08-06 ENCOUNTER — Other Ambulatory Visit: Payer: Self-pay | Admitting: *Deleted

## 2021-08-06 ENCOUNTER — Other Ambulatory Visit: Payer: Self-pay

## 2021-08-06 NOTE — Patient Outreach (Signed)
?Medicaid Managed Care   ?Nurse Care Manager Note ? ?08/06/2021 ?Name:  Paul [name] MRN:  [id number] DOB:  06/17/1990 ? ?Paul [name] is an 31 y.o. year old male who is a primary patient of Paul Brill, DO.  The [facility] Managed Care Coordination team was consulted for assistance with:    ?DMII and GT amputation ? ?Mr. Paul [name] was given information about Medicaid Managed Care Coordination team services today. Paul [name] Patient agreed to services and verbal consent obtained. ? ?Engaged with patient by telephone for follow up visit in response to provider referral for case management and/or care coordination services.  ? ?Assessments/Interventions:  Review of past medical history, allergies, medications, health status, including review of consultants reports, laboratory and other test data, was performed as part of comprehensive evaluation and provision of chronic care management services. ? ?SDOH (Social Determinants of Health) assessments and interventions performed: ?SDOH Interventions   ? ?Flowsheet Row Most Recent Value  ?SDOH Interventions   ?Housing Interventions Intervention Not Indicated  ?Stress Interventions Patient Refused  [Patient plans to discuss with PCP at next appointment]  ? ?  ? ? ?Care Plan ? ?Allergies  ?Allergen Reactions  ? Hydralazine Hcl Other (See Comments)  ?  DRUG-INDUCED LUPUS  ? ? ?Medications Reviewed Today   ? ? Reviewed by Paul Montane, RN (Registered Nurse) on 08/06/21 at 15  Med List Status: <None>  ? ?Medication Order Taking? Sig Documenting Provider Last Dose Status Informant  ?Accu-Chek Softclix Lancets lancets [id number] Yes Use as instructed Paul Gibson, MD Taking Active Self  ?acetaminophen (TYLENOL) 500 MG tablet [id number] Yes Take 1,000 mg by mouth every 6 (six) hours as needed (for pain.). [provider] Taking Active Self  ?apixaban (ELIQUIS) 5 MG TABS tablet [id number] Yes Take 1 tablet (5 mg total) by mouth 2 (two) times daily. START TAKING ONLY  AFTER STARTER PACK HAS BEEN COMPLETED Bonnielee Haff, MD Taking Active Self  ?Blood Glucose Monitoring Suppl (ACCU-CHEK GUIDE ME) w/Device KIT [id number] Yes 1 kit by Does not apply route daily. Paul Gibson, MD Taking Active Self  ?         ?Med Note Paul [name], Paul R   Mon Jun 28, 2021 10:54 AM)    ?calcium carbonate (TUMS - DOSED IN MG ELEMENTAL CALCIUM) 500 MG chewable tablet [id number] Yes Chew 1,000-1,500 mg by mouth daily as needed for indigestion or heartburn. [provider] Taking Active Self  ?cinacalcet (SENSIPAR) 60 MG tablet [id number] Yes Take 60 mg by mouth daily. [provider] Taking Active Self  ?         ?Med Note Paul [name]   Tue Jun 08, 2021 12:13 PM)    ?ferric citrate (AURYXIA) 1 GM 210 MG(Fe) tablet [id number] Yes Take 420-840 mg by mouth See admin instructions. Take 4 tablets (840 mg) by mouth with each meal & take 2 tablets (420 mg) by mouth with each snack. [provider] Taking Active Self  ?gabapentin (NEURONTIN) 100 MG capsule [id number] Yes Take 1 capsule (100 mg total) by mouth 3 (three) times daily. Bonnielee Haff, MD Taking Active Self  ?glucose blood (ACCU-CHEK GUIDE) test strip [id number] Yes 1 each by Other route 2 (two) times daily. Use as instructed Paul Gibson, MD Taking Active Self  ?heparin 1000 unit/mL SOLN injection [id number] Yes Heparin Sodium (Porcine) 1,000 Units/mL Systemic [provider] Taking Active Self  ?         ?Med Note Paul [name], KYLE A  Fri Apr 30, 2021  8:27 AM) Administered at dialysis  ?insulin glargine (LANTUS) 100 UNIT/ML injection [id number] Yes Inject 0.1 mLs (10 Units total) into the skin 2 (two) times daily. Paul Brill, DO Taking Active   ?levothyroxine (SYNTHROID) 75 MCG tablet [id number] Yes TAKE 1 TABLET BY MOUTH ONCE DAILY BEFORE BREAKFAST  ?Patient taking differently: Take 150 mcg by mouth daily before breakfast.  ? Paul Brill, DO Taking Active Self  ?         ?Med Note Paul [name],  Paul R   Mon Jun 28, 2021 10:55 AM)    ?midodrine (PROAMATINE) 5 MG tablet [id number] No Take 1 tablet (5 mg total) by mouth 3 (three) times daily with meals.  ?Patient not taking: Reported on 08/06/2021  ? Bonnielee Haff, MD Not Taking Active Self  ?multivitamin (RENA-VIT) TABS tablet [id number] Yes Take 1 tablet by mouth at bedtime. Paul Bang, MD Taking Active Self  ?oxyCODONE-acetaminophen (PERCOCET) 7.5-325 MG tablet [id number] No Take 1 tablet by mouth every 6 (six) hours as needed for severe pain or moderate pain.  ?Patient not taking: Reported on 08/06/2021  ? Paul Amor, PA-C Not Taking Active   ?polyethylene glycol (MIRALAX / GLYCOLAX) 17 g packet [id number] No Take 17 g by mouth daily as needed.  ?Patient not taking: Reported on 08/06/2021  ? Bonnielee Haff, MD Not Taking Active Self  ?         ?Med Note Paul [name], Paul R   Mon Jun 28, 2021 10:57 AM)    ?rosuvastatin (CRESTOR) 20 MG tablet [id number] Yes TAKE 2 TABLETS BY MOUTH ONCE DAILY . APPOINTMENT REQUIRED FOR FUTURE REFILLS Paul Brill, DO Taking Active Self  ?Syringe, Disposable, (B-D SYRINGE SLIP TIP 30CC) 30 ML MISC [id number] Yes 1 Syringe by Does not apply route daily. Wilber Oliphant, MD Taking Active Self  ?Med List Note Paul [name] 07/24/20 [id number]): Dialysis Tues, thurs and Saturday  ? ?  ?  ? ?  ? ? ?Patient Active Problem List  ? Diagnosis Date Noted  ? Diabetic foot infection ([place])   ? Wound infection 05/07/2021  ? MSSA bacteremia 05/07/2021  ? Osteomyelitis ([place]) 05/07/2021  ? Abscess 05/07/2021  ? Sepsis due to cellulitis ([place]) 05/06/2021  ? Poor compliance with medication 03/17/2021  ? Fluid overload, unspecified 07/09/2020  ? Complication of vascular dialysis catheter 03/23/2020  ? Other disorders of phosphorus metabolism 01/28/2020  ? Hypertension 01/08/2020  ? Hypocalcemia 10/29/2019  ? ESRD on dialysis ([place]) 11/16/2017  ? Pruritus, unspecified 04/04/2017  ? Unspecified protein-calorie malnutrition ([place])  11/11/2016  ? Headache, unspecified 10/29/2016  ? Encounter for immunization 10/19/2016  ? Iron deficiency anemia, unspecified 10/10/2016  ? Anemia in chronic kidney disease 09/30/2016  ? Coagulation defect, unspecified ([place]) 09/30/2016  ? Pneumonia due to Pseudomonas ([place]) 09/30/2016  ? Secondary hyperparathyroidism of renal origin ([place]) 09/30/2016  ? Hypothyroidism 10/29/2009  ? Type 2 diabetes mellitus ([place]) 04/14/2009  ? Hyperlipidemia 04/14/2009  ? OBESITY, MORBID 04/14/2009  ? Essential hypertension, benign 04/14/2009  ? ? ?Conditions to be addressed/monitored per PCP order:  DMII and GT amputation ? ?Care Plan : RN Care Manager Plan of Care  ?Updates made by Paul Montane, RN since 08/06/2021 12:00 AM  ?  ? ?Problem: Health management needs related to DMII and recent great toe amputation   ?  ? ?Long-Range Goal: Development of Plan of Care to address health management needs related to DMII and recent great toe amputation   ?Start  Date: 05/21/2021  ?Expected End Date: 09/17/2021  ?Priority: High  ?Note:   ?Current Barriers:  ?Chronic Disease Management support and education needs related to DMII and great toe amputation ? Mr. Mcdanel is checking his blood sugar 1 time a day after a meal and reports readings around 150-165. He is scheduled for part 2 of fistula creation on 08/20/21. He did not attend recent PCP appointment and needs to reschedule. ? ?RNCM Clinical Goal(s):  ?Patient will verbalize understanding of plan for management of DMII and great toe amputation as evidenced by patient verbalization of self monitoring activity ?attend all scheduled medical appointments: call for clarification GS:[id number] visit with Dr. Sharol Given on 3/20, 08/20/21 for part 2 of fistula creation and schedule visit with PCP as evidenced by provider documentation in EMR        ?demonstrate improved adherence to prescribed treatment plan for DMII as evidenced by provider documentation in EMR ?work with pharmacist to address  understanding of medications related to DMII as evidenced by review of EMR and patient or pharmacist report    through collaboration with Consulting civil engineer, provider, and care team.  ? ?Interventions: ?Inter-disciplina

## 2021-08-06 NOTE — Patient Instructions (Signed)
Visit Information ? ?Mr. Natter was given information about Medicaid Managed Care team care coordination services as a part of their [place] Medicaid benefit. [name] verbally consented to engagement with the [facility] Managed Care team.  ? ?If you are experiencing a medical emergency, please call 911 or report to your local emergency department or urgent care.  ? ?If you have a non-emergency medical problem during routine business hours, please contact your provider's office and ask to speak with a nurse.  ? ?For questions related to your [facility], please call: [phone] or visit the homepage here: [email or web address] ? ?If you would like to schedule transportation through your [facility], please call the following number at least 2 days in advance of your appointment: [phone]. ? Rides for urgent appointments can also be made after hours by calling Member Services. ? ?Call the [facility] at [phone], at any time, 24 hours a day, 7 days a week. If you are in danger or need immediate medical attention call 911. ? ?If you would like help to quit smoking, call 1-800-QUIT-NOW 253 134 2880) OR Espa?ol: 1-855-D?jelo-Ya (438)[phone]) o para m?s informaci?n haga clic aqu? or Text READY to 200-400 to register via text ? ?Mr. Valere Dross, ? ? ?Please see education materials related to DM provided by MyChart link. ? ?Patient verbalizes understanding of instructions and care plan provided today and agrees to view in [place]. Active MyChart status confirmed with patient.   ? ?Telephone follow up appointment with Managed Medicaid care management team member scheduled for:09/05/21 @ 10:30am ? ?Lurena Joiner RN, BSN ?[place] ?RN Care Coordinator ? ? ?Following is a copy of your plan of care:  ?Care Plan : [place] of Care   ?Updates made by Melissa Montane, RN since 08/06/2021 12:00 AM  ?  ? ?Problem: Health management needs related to DMII and recent great toe amputation   ?  ? ?Long-Range Goal: Development of Plan of Care to address health management needs related to DMII and recent great toe amputation   ?Start Date: 05/21/2021  ?Expected End Date: 09/17/2021  ?Priority: High  ?Note:   ?Current Barriers:  ?Chronic Disease Management support and education needs related to DMII and great toe amputation ? Mr. Jun is checking his blood sugar 1 time a day after a meal and reports readings around 150-165. He is scheduled for part 2 of fistula creation on 08/20/21. He did not attend recent PCP appointment and needs to reschedule. ? ?RNCM Clinical Goal(s):  ?Patient will verbalize understanding of plan for management of DMII and great toe amputation as evidenced by patient verbalization of self monitoring activity ?attend all scheduled medical appointments: call for clarification [id number] visit with Dr. Sharol Given on 3/20, 08/20/21 for part 2 of fistula creation and schedule visit with PCP as evidenced by provider documentation in EMR        ?demonstrate improved adherence to prescribed treatment plan for DMII as evidenced by provider documentation in EMR ?work with pharmacist to address understanding of medications related to DMII as evidenced by review of EMR and patient or pharmacist report    through collaboration with Consulting civil engineer, provider, and care team.  ? ?Interventions: ?Inter-disciplinary care team collaboration (see longitudinal plan of care) ?Evaluation of current treatment plan related to  self management and patient's adherence to plan as established by provider ?Provided patient with information for eye exam referral-[facility]  [phone]-discussed importance of scheduling eye exam ?Advised patient to take his glucometer and all medications with him to his next PCP appointment  ? ? ?Diabetes:  (Status: Goal  on Track (progressing): YES.) Long Term Goal  ? ?Lab Results  ?Component Value Date  ? HGBA1C 6.0 (H) 05/07/2021  ?Assessed patient's understanding of A1c goal: <7% ?Provided education to patient about basic DM disease process; ?Reviewed medications with patient and discussed importance of medication adherence;        ?Discussed plans with patient for ongoing care management follow up and provided patient with direct contact information for care management team;      ?Reviewed scheduled/upcoming provider appointments including: rescheduling missed appt with PCP and procedure scheduled on 08/20/21;         ?Review of patient status, including review of consultants reports, relevant laboratory and other test results, and medications completed;       ?Advised patient to discuss podiatry referral with provider;      ?Assessed social determinant of health barriers;        ?Encouraged patient to continue to increase exercise to 150 min/week ?Discussed diet and the importance of 3 meals a day, including lean protein with each meal, examples provided ? ?Great toe amputation  (Status: Goal on Track (progressing): YES.) Long Term Goal  ?Discussed plans with patient for ongoing care management follow up and provided patient with direct contact information for care management team ?Reviewed medications with patient and discussed importance of compliance; ?Reviewed scheduled/upcoming provider appointments including clarifying next appointment with Dr. Sharol Given; ?Discussed plans with patient for ongoing care management follow up and provided patient with direct contact information for care management team; ?Assessed social determinant of health barriers;  ? ?Patient Goals/Self-Care Activities: ?Take medications as prescribed   ?Attend all scheduled provider appointments ?Call pharmacy for medication refills 3-7 days in advance of running out of medications ?Perform all self care activities independently  ?Call provider office for new  concerns or questions  ?schedule appointment with eye doctor ?check blood sugar at prescribed times: 4 times daily and when you have symptoms of low or high blood sugar ?enter blood sugar readings and medication or insulin into daily log ?take the blood sugar log to all doctor visits ?fill half of plate with vegetables ?manage portion size ?prepare main meal at home 3 to 5 days each week ? ? ?  ?  ?

## 2021-08-08 ENCOUNTER — Other Ambulatory Visit: Payer: Self-pay | Admitting: Student

## 2021-08-08 DIAGNOSIS — E039 Hypothyroidism, unspecified: Secondary | ICD-10-CM

## 2021-08-09 ENCOUNTER — Encounter: Payer: Medicaid Other | Admitting: Orthopedic Surgery

## 2021-08-13 ENCOUNTER — Other Ambulatory Visit: Payer: Self-pay | Admitting: Student

## 2021-08-18 ENCOUNTER — Other Ambulatory Visit: Payer: Self-pay

## 2021-08-18 ENCOUNTER — Encounter (HOSPITAL_COMMUNITY): Payer: Self-pay | Admitting: Vascular Surgery

## 2021-08-18 NOTE — Progress Notes (Signed)
TWO VISITORS ARE ALLOWED TO COME WITH YOU AND STAY IN THE SURGICAL WAITING ROOM ONLY DURING PRE OP AND PROCEDURE DAY OF SURGERY.  ? ?PCP - [facility]  ?Cardiologist - n/a ? ?Chest x-ray - 05/06/21 (1V) ?EKG - 05/06/21 ?Stress Test - 03/06/20 CE ?ECHO TEE - 05/12/21 ?Cardiac Cath - n/a ? ?ICD Pacemaker/Loop - n/a ? ?Sleep Study -  n/a ?CPAP - none ? ?THE NIGHT BEFORE SURGERY, take 5 units Lantus Insulin. ?     ?THE MORNING OF SURGERY, take 5 units Lantus Insulin. ? ?If your blood sugar is less than 70 mg/dL, you will need to treat for low blood sugar: ?Treat a low blood sugar (less than 70 mg/dL) with ? cup of clear juice (cranberry or apple), 4 glucose tablets, OR glucose gel. ?Recheck blood sugar in 15 minutes after treatment (to make sure it is greater than 70 mg/dL). If your blood sugar is not greater than 70 mg/dL on recheck, call [phone] for further instructions. ? ?Blood Thinner Instructions:  Stop Eliquis 48 hours prior to procedure per MD.  Last dose was on 08/17/21. ? ?Anesthesia review: Yes ? ?STOP now taking any Aspirin (unless otherwise instructed by your surgeon), Aleve, Naproxen, Ibuprofen, Motrin, Advil, Goody's, BC's, all herbal medications, fish oil, and all vitamins.  ? ?Coronavirus Screening ?Covid test n/a Ambulatory Surgery  ?Do you have any of the following symptoms:  ?Cough yes/no: No ?Fever (>100.67F)  yes/no: No ?Runny nose yes/no: No ?Sore throat yes/no: No ?Difficulty breathing/shortness of breath  yes/no: No ? ?Have you traveled in the last 14 days and where? yes/no: No ? ?Patient verbalized understanding of instructions that were given via phone. ?

## 2021-08-19 NOTE — Anesthesia Preprocedure Evaluation (Addendum)
Anesthesia Evaluation  ?Patient identified by MRN, date of birth, ID band ?Patient awake ? ? ? ?Reviewed: ?Allergy & Precautions, NPO status , Patient's Chart, lab work & pertinent test results ? ?History of Anesthesia Complications ?Negative for: history of anesthetic complications ? ?Airway ?Mallampati: III ? ?TM Distance: >3 FB ?Neck ROM: Full ? ? ? Dental ?no notable dental hx. ? ?  ?Pulmonary ?neg pulmonary ROS,  ?  ?Pulmonary exam normal ? ? ? ? ? ? ? Cardiovascular ?hypertension, Normal cardiovascular exam ? ?Echo 2022: EF 60-65%, valves ok ?  ?Neuro/Psych ?negative neurological ROS ? negative psych ROS  ? GI/Hepatic ?Neg liver ROS, GERD  Controlled,  ?Endo/Other  ?diabetes, Type 2, Insulin DependentHypothyroidism Morbid obesity ? Renal/GU ?Dialysis and ESRFRenal disease (HD T/Th/Sa)  ?negative genitourinary ?  ?Musculoskeletal ?negative musculoskeletal ROS ?(+)  ? Abdominal ?  ?Peds ? Hematology ?negative hematology ROS ?(+)   ?Anesthesia Other Findings ?Day of surgery medications reviewed with patient. ? Reproductive/Obstetrics ?negative OB ROS ? ?  ? ? ? ? ? ? ? ? ? ? ? ? ? ?  ?  ? ? ? ? ? ? ? ?Anesthesia Physical ?Anesthesia Plan ? ?ASA: 3 ? ?Anesthesia Plan: General  ? ?Post-op Pain Management: Tylenol PO (pre-op)*  ? ?Induction: Intravenous ? ?PONV Risk Score and Plan: 2 and Treatment may vary due to age or medical condition, Ondansetron, Dexamethasone and Midazolam ? ?Airway Management Planned: LMA ? ?Additional Equipment: None ? ?Intra-op Plan:  ? ?Post-operative Plan: Extubation in OR ? ?Informed Consent: I have reviewed the patients History and Physical, chart, labs and discussed the procedure including the risks, benefits and alternatives for the proposed anesthesia with the patient or authorized representative who has indicated his/her understanding and acceptance.  ? ? ? ?Dental advisory given ? ?Plan Discussed with: CRNA ? ?Anesthesia Plan Comments:    ? ? ? ? ? ?Anesthesia Quick Evaluation ? ?

## 2021-08-20 ENCOUNTER — Ambulatory Visit (HOSPITAL_COMMUNITY)
Admission: RE | Admit: 2021-08-20 | Discharge: 2021-08-20 | Disposition: A | Discharge status: Home / Self Care | Payer: Medicaid Other | Attending: Vascular Surgery | Admitting: Vascular Surgery

## 2021-08-20 ENCOUNTER — Other Ambulatory Visit: Payer: Self-pay

## 2021-08-20 ENCOUNTER — Encounter (HOSPITAL_COMMUNITY)
Admission: RE | Disposition: A | Discharge status: Home / Self Care | Payer: Self-pay | Source: Home / Self Care | Attending: Vascular Surgery

## 2021-08-20 ENCOUNTER — Encounter (HOSPITAL_COMMUNITY): Payer: Self-pay | Admitting: Vascular Surgery

## 2021-08-20 ENCOUNTER — Ambulatory Visit (HOSPITAL_COMMUNITY): Payer: Medicaid Other | Admitting: Physician Assistant

## 2021-08-20 ENCOUNTER — Ambulatory Visit (HOSPITAL_BASED_OUTPATIENT_CLINIC_OR_DEPARTMENT_OTHER): Payer: Medicaid Other | Admitting: Physician Assistant

## 2021-08-20 ENCOUNTER — Ambulatory Visit: Payer: Self-pay

## 2021-08-20 DIAGNOSIS — Z794 Long term (current) use of insulin: Secondary | ICD-10-CM | POA: Diagnosis not present

## 2021-08-20 DIAGNOSIS — N185 Chronic kidney disease, stage 5: Secondary | ICD-10-CM | POA: Diagnosis not present

## 2021-08-20 DIAGNOSIS — E1122 Type 2 diabetes mellitus with diabetic chronic kidney disease: Secondary | ICD-10-CM

## 2021-08-20 DIAGNOSIS — Z992 Dependence on renal dialysis: Secondary | ICD-10-CM | POA: Insufficient documentation

## 2021-08-20 DIAGNOSIS — N186 End stage renal disease: Secondary | ICD-10-CM | POA: Diagnosis not present

## 2021-08-20 DIAGNOSIS — I120 Hypertensive chronic kidney disease with stage 5 chronic kidney disease or end stage renal disease: Secondary | ICD-10-CM

## 2021-08-20 DIAGNOSIS — I129 Hypertensive chronic kidney disease with stage 1 through stage 4 chronic kidney disease, or unspecified chronic kidney disease: Secondary | ICD-10-CM | POA: Diagnosis not present

## 2021-08-20 DIAGNOSIS — E1129 Type 2 diabetes mellitus with other diabetic kidney complication: Secondary | ICD-10-CM | POA: Diagnosis not present

## 2021-08-20 HISTORY — PX: BASCILIC VEIN TRANSPOSITION: SHX5742

## 2021-08-20 LAB — GLUCOSE, CAPILLARY
Glucose-Capillary: 130 mg/dL — ABNORMAL HIGH (ref 70–99)
Glucose-Capillary: 101 mg/dL — ABNORMAL HIGH (ref 70–99)
Glucose-Capillary: 154 mg/dL — ABNORMAL HIGH (ref 70–99)

## 2021-08-20 LAB — POCT I-STAT, CHEM 8
BUN: 47 mg/dL — ABNORMAL HIGH (ref 6–20)
Calcium, Ion: 1.06 mmol/L — ABNORMAL LOW (ref 1.15–1.40)
Chloride: 99 mmol/L (ref 98–111)
Creatinine, Ser: 14.7 mg/dL — ABNORMAL HIGH (ref 0.61–1.24)
Glucose, Bld: 114 mg/dL — ABNORMAL HIGH (ref 70–99)
HCT: 54 % — ABNORMAL HIGH (ref 39.0–52.0)
Hemoglobin: 18.4 g/dL — ABNORMAL HIGH (ref 13.0–17.0)
Potassium: 4.7 mmol/L (ref 3.5–5.1)
Sodium: 137 mmol/L (ref 135–145)
TCO2: 28 mmol/L (ref 22–32)

## 2021-08-20 SURGERY — TRANSPOSITION, VEIN, BASILIC
Anesthesia: General | Laterality: Left

## 2021-08-20 MED ORDER — FENTANYL CITRATE (PF) 250 MCG/5ML IJ SOLN
INTRAMUSCULAR | Status: DC | PRN
  Administered 2021-08-20: 50 ug via INTRAVENOUS
  Administered 2021-08-20: 100 ug via INTRAVENOUS
  Administered 2021-08-20 (×7): 50 ug via INTRAVENOUS

## 2021-08-20 MED ORDER — PHENYLEPHRINE 40 MCG/ML (10ML) SYRINGE FOR IV PUSH (FOR BLOOD PRESSURE SUPPORT)
PREFILLED_SYRINGE | INTRAVENOUS | Status: DC | PRN
Start: 2021-08-20 — End: 2021-08-20
  Administered 2021-08-20 (×2): 120 ug via INTRAVENOUS
  Administered 2021-08-20: 160 ug via INTRAVENOUS

## 2021-08-20 MED ORDER — PHENYLEPHRINE HCL-NACL 20-0.9 MG/250ML-% IV SOLN
INTRAVENOUS | Status: DC | PRN
  Administered 2021-08-20: 50 ug/min via INTRAVENOUS

## 2021-08-20 MED ORDER — DEXTROSE 5 % IV SOLN
INTRAVENOUS | Status: DC | PRN
  Administered 2021-08-20: 3 g via INTRAVENOUS

## 2021-08-20 MED ORDER — CEFAZOLIN IN SODIUM CHLORIDE 3-0.9 GM/100ML-% IV SOLN
3.0000 g | INTRAVENOUS | Status: DC
  Filled 2021-08-20: qty 100

## 2021-08-20 MED ORDER — LIDOCAINE 2% (20 MG/ML) 5 ML SYRINGE
INTRAMUSCULAR | Status: AC
  Filled 2021-08-20: qty 5

## 2021-08-20 MED ORDER — MIDAZOLAM HCL 2 MG/2ML IJ SOLN
INTRAMUSCULAR | Status: DC | PRN
  Administered 2021-08-20: 2 mg via INTRAVENOUS

## 2021-08-20 MED ORDER — CHLORHEXIDINE GLUCONATE 4 % EX LIQD: 60.0000 mL | Freq: Once | CUTANEOUS | Status: DC

## 2021-08-20 MED ORDER — GLYCOPYRROLATE 0.2 MG/ML IJ SOLN
INTRAMUSCULAR | Status: DC | PRN
  Administered 2021-08-20: .2 mg via INTRAVENOUS

## 2021-08-20 MED ORDER — OXYCODONE HCL 5 MG/5ML PO SOLN
ORAL | Status: AC
  Filled 2021-08-20: qty 5

## 2021-08-20 MED ORDER — OXYCODONE HCL 5 MG/5ML PO SOLN
5.0000 mg | Freq: Once | ORAL | Status: AC | PRN
  Administered 2021-08-20: 5 mg via ORAL

## 2021-08-20 MED ORDER — FENTANYL CITRATE (PF) 250 MCG/5ML IJ SOLN
INTRAMUSCULAR | Status: AC
  Filled 2021-08-20: qty 5

## 2021-08-20 MED ORDER — ONDANSETRON HCL 4 MG/2ML IJ SOLN
INTRAMUSCULAR | Status: AC
  Filled 2021-08-20: qty 2

## 2021-08-20 MED ORDER — PROPOFOL 10 MG/ML IV BOLUS
INTRAVENOUS | Status: AC
  Filled 2021-08-20: qty 20

## 2021-08-20 MED ORDER — OXYCODONE-ACETAMINOPHEN 7.5-325 MG PO TABS
1.0000 | ORAL_TABLET | Freq: Four times a day (QID) | ORAL | 0 refills | Status: DC | PRN
Start: 2021-08-20 — End: 2021-09-22

## 2021-08-20 MED ORDER — EPHEDRINE 5 MG/ML INJ
INTRAVENOUS | Status: AC
  Filled 2021-08-20: qty 5

## 2021-08-20 MED ORDER — AMISULPRIDE (ANTIEMETIC) 5 MG/2ML IV SOLN
INTRAVENOUS | Status: AC
  Filled 2021-08-20: qty 4

## 2021-08-20 MED ORDER — LIDOCAINE 2% (20 MG/ML) 5 ML SYRINGE
INTRAMUSCULAR | Status: DC | PRN
  Administered 2021-08-20: 100 mg via INTRAVENOUS

## 2021-08-20 MED ORDER — HEPARIN 6000 UNIT IRRIGATION SOLUTION
Status: DC | PRN
  Administered 2021-08-20: 1

## 2021-08-20 MED ORDER — PROPOFOL 10 MG/ML IV BOLUS
INTRAVENOUS | Status: DC | PRN
  Administered 2021-08-20: 200 mg via INTRAVENOUS
  Administered 2021-08-20: 20 mg via INTRAVENOUS
  Administered 2021-08-20 (×3): 30 mg via INTRAVENOUS

## 2021-08-20 MED ORDER — FENTANYL CITRATE (PF) 100 MCG/2ML IJ SOLN: 25.0000 ug | INTRAMUSCULAR | Status: DC | PRN

## 2021-08-20 MED ORDER — AMISULPRIDE (ANTIEMETIC) 5 MG/2ML IV SOLN
10.0000 mg | Freq: Once | INTRAVENOUS | Status: AC
  Administered 2021-08-20: 10 mg via INTRAVENOUS

## 2021-08-20 MED ORDER — MIDAZOLAM HCL 2 MG/2ML IJ SOLN
INTRAMUSCULAR | Status: AC
  Filled 2021-08-20: qty 2

## 2021-08-20 MED ORDER — INSULIN ASPART 100 UNIT/ML IJ SOLN: 0.0000 [IU] | INTRAMUSCULAR | Status: DC | PRN

## 2021-08-20 MED ORDER — SODIUM CHLORIDE 0.9 % IV SOLN
INTRAVENOUS | Status: DC
Start: 2021-08-20 — End: 2021-08-20

## 2021-08-20 MED ORDER — OXYCODONE HCL 5 MG PO TABS: 5.0000 mg | ORAL_TABLET | Freq: Once | ORAL | Status: AC | PRN

## 2021-08-20 MED ORDER — ONDANSETRON HCL 4 MG/2ML IJ SOLN
INTRAMUSCULAR | Status: DC | PRN
  Administered 2021-08-20: 4 mg via INTRAVENOUS

## 2021-08-20 MED ORDER — HEPARIN 6000 UNIT IRRIGATION SOLUTION
Status: AC
  Filled 2021-08-20: qty 500

## 2021-08-20 MED ORDER — ACETAMINOPHEN 500 MG PO TABS
1000.0000 mg | ORAL_TABLET | Freq: Once | ORAL | Status: AC
  Administered 2021-08-20: 1000 mg via ORAL
  Filled 2021-08-20: qty 2

## 2021-08-20 MED ORDER — GLYCOPYRROLATE PF 0.2 MG/ML IJ SOSY
PREFILLED_SYRINGE | INTRAMUSCULAR | Status: AC
  Filled 2021-08-20: qty 1

## 2021-08-20 MED ORDER — PHENYLEPHRINE 40 MCG/ML (10ML) SYRINGE FOR IV PUSH (FOR BLOOD PRESSURE SUPPORT)
PREFILLED_SYRINGE | INTRAVENOUS | Status: AC
  Filled 2021-08-20: qty 10

## 2021-08-20 MED ORDER — EPHEDRINE SULFATE-NACL 50-0.9 MG/10ML-% IV SOSY
PREFILLED_SYRINGE | INTRAVENOUS | Status: DC | PRN
  Administered 2021-08-20: 10 mg via INTRAVENOUS

## 2021-08-20 MED ORDER — CHLORHEXIDINE GLUCONATE 0.12 % MT SOLN
OROMUCOSAL | Status: AC
  Administered 2021-08-20: 15 mL
  Filled 2021-08-20: qty 15

## 2021-08-20 MED ORDER — SODIUM CHLORIDE 0.9 % IV SOLN
INTRAVENOUS | Status: DC | PRN
Start: 2021-08-20 — End: 2021-08-20

## 2021-08-20 MED ORDER — DEXAMETHASONE SODIUM PHOSPHATE 10 MG/ML IJ SOLN
INTRAMUSCULAR | Status: DC | PRN
  Administered 2021-08-20: 4 mg via INTRAVENOUS

## 2021-08-20 SURGICAL SUPPLY — 33 items
ARMBAND PINK RESTRICT EXTREMIT (MISCELLANEOUS) ×2 IMPLANT
BAG COUNTER SPONGE SURGICOUNT (BAG) ×2 IMPLANT
CANISTER SUCT 3000ML PPV (MISCELLANEOUS) ×2 IMPLANT
CLIP LIGATING EXTRA MED SLVR (CLIP) ×2 IMPLANT
CLIP LIGATING EXTRA SM BLUE (MISCELLANEOUS) ×3 IMPLANT
COVER PROBE W GEL 5X96 (DRAPES) ×2 IMPLANT
DERMABOND ADVANCED (GAUZE/BANDAGES/DRESSINGS) ×1
DERMABOND ADVANCED .7 DNX12 (GAUZE/BANDAGES/DRESSINGS) ×1 IMPLANT
ELECT REM PT RETURN 9FT ADLT (ELECTROSURGICAL) ×2
ELECTRODE REM PT RTRN 9FT ADLT (ELECTROSURGICAL) ×1 IMPLANT
GLOVE SURG ENC MOIS LTX SZ7.5 (GLOVE) ×2 IMPLANT
GOWN STRL REUS W/ TWL LRG LVL3 (GOWN DISPOSABLE) ×2 IMPLANT
GOWN STRL REUS W/ TWL XL LVL3 (GOWN DISPOSABLE) ×1 IMPLANT
GOWN STRL REUS W/TWL LRG LVL3 (GOWN DISPOSABLE) ×2
GOWN STRL REUS W/TWL XL LVL3 (GOWN DISPOSABLE) ×1
GRAFT VASCULAR 7X40 (Vascular Products) ×1 IMPLANT
KIT BASIN OR (CUSTOM PROCEDURE TRAY) ×2 IMPLANT
KIT TURNOVER KIT B (KITS) ×2 IMPLANT
NS IRRIG 1000ML POUR BTL (IV SOLUTION) ×2 IMPLANT
PACK CV ACCESS (CUSTOM PROCEDURE TRAY) ×2 IMPLANT
PAD ARMBOARD 7.5X6 YLW CONV (MISCELLANEOUS) ×4 IMPLANT
SLING ARM FOAM STRAP LRG (SOFTGOODS) ×2 IMPLANT
SLING ARM FOAM STRAP MED (SOFTGOODS) IMPLANT
SPONGE T-LAP 18X18 ~~LOC~~+RFID (SPONGE) ×3 IMPLANT
SUT MNCRL AB 4-0 PS2 18 (SUTURE) ×4 IMPLANT
SUT PROLENE 5 0 C 1 24 (SUTURE) ×3 IMPLANT
SUT PROLENE 6 0 BV (SUTURE) ×3 IMPLANT
SUT SILK 2 0 SH (SUTURE) IMPLANT
SUT VIC AB 3-0 SH 27 (SUTURE) ×3
SUT VIC AB 3-0 SH 27X BRD (SUTURE) ×1 IMPLANT
TOWEL GREEN STERILE (TOWEL DISPOSABLE) ×2 IMPLANT
UNDERPAD 30X36 HEAVY ABSORB (UNDERPADS AND DIAPERS) ×2 IMPLANT
WATER STERILE IRR 1000ML POUR (IV SOLUTION) ×2 IMPLANT

## 2021-08-20 NOTE — Interval H&P Note (Signed)
History and Physical Interval Note: ? ?08/20/2021 ?11:16 AM ? ?Paul [name]  has presented today for surgery, with the diagnosis of ESRD.  The various methods of treatment have been discussed with the patient and family. After consideration of risks, benefits and other options for treatment, the patient has consented to  Procedure(s): ?LEFT ARM SECOND STAGE BASILIC VEIN TRANSPOSITION (Left) as a surgical intervention.  The patient's history has been reviewed, patient examined, no change in status, stable for surgery.  I have reviewed the patient's chart and labs.  Questions were answered to the patient's satisfaction.   ? ? ?[name] ? ? ?

## 2021-08-20 NOTE — Discharge Instructions (Addendum)
? ?  [facility] ? ?Discharge Instructions ? ?AV Fistula or Graft Surgery for Dialysis Access ? ?Please refer to the following instructions for your post-procedure care. Your surgeon or physician assistant will discuss any changes with you. ? ?Activity ? ?You may drive the day following your surgery, if you are comfortable and no longer taking prescription pain medication. Resume full activity as the soreness in your incision resolves. ? ?Bathing/Showering ? ?You may shower after you go home. Keep your incision dry for 48 hours. Do not soak in a bathtub, hot tub, or swim until the incision heals completely. You may not shower if you have a hemodialysis catheter. ? ?Incision Care ? ?Clean your incision with mild soap and water after 48 hours. Pat the area dry with a clean towel. You do not need a bandage unless otherwise instructed. Do not apply any ointments or creams to your incision. You may have skin glue on your incision. Do not peel it off. It will come off on its own in about one week. Your arm may swell a bit after surgery. To reduce swelling use pillows to elevate your arm so it is above your heart. Your doctor will tell you if you need to lightly wrap your arm with an ACE bandage. ? ?Diet ? ?Resume your normal diet. There are not special food restrictions following this procedure. In order to heal from your surgery, it is CRITICAL to get adequate nutrition. Your body requires vitamins, minerals, and protein. Vegetables are the best source of vitamins and minerals. Vegetables also provide the perfect balance of protein. Processed food has little nutritional value, so try to avoid this. ? ?Medications ? ?Resume taking all of your medications. If your incision is causing pain, you may take over-the counter pain relievers such as acetaminophen (Tylenol). If you were prescribed a stronger pain medication, please be aware these medications can cause nausea and constipation. Prevent  nausea by taking the medication with a snack or meal. Avoid constipation by drinking plenty of fluids and eating foods with high amount of fiber, such as fruits, vegetables, and grains.  ?Do not take Tylenol if you are taking prescription pain medications. ? ?Restart your Eliquis on 08/22/2021. ? ?Follow up ?Your surgeon may want to see you in the office following your access surgery. If so, this will be arranged at the time of your surgery. ? ?Please call us immediately for any of the following conditions: ? ?Increased pain, redness, drainage (pus) from your incision site ?Fever of 101 degrees or higher ?Severe or worsening pain at your incision site ?Hand pain or numbness. ? ?Reduce your risk of vascular disease: ? ?Stop smoking. If you would like help, call [name] at 1-800-QUIT-NOW 7015939462) or [place] at [phone] ? ?Manage your cholesterol ?Maintain a desired weight ?Control your diabetes ?Keep your blood pressure down ? ?Dialysis ? ?It will take several weeks to several months for your new dialysis access to be ready for use. Your surgeon will determine when it is okay to use it. Your nephrologist will continue to direct your dialysis. You can continue to use your Permcath until your new access is ready for use. ? ? ?08/20/2021 ?Paul [name] ?[id number] ?[birth year] ? ?Surgeon(s): ?Waynetta Sandy, MD ? ?Procedure(s): ?Insertion left upper arm AV graft ? ?x Do not stick graft for 4 weeks  ? ? ?If you have any questions, please call the office at [phone]. ? ?

## 2021-08-20 NOTE — Op Note (Addendum)
? ? ?Patient name: Paul [name] MRN: [id number] DOB: Aug 30, 1990 Sex: male ? ?08/20/2021 ?Pre-operative Diagnosis: esrd ?Post-operative diagnosis:  Same ?Surgeon:  Eda Paschal. Donzetta Matters, MD ?Assistant: Leontine Locket, [place]; Clydell Hakim, MS3 ?Procedure Performed: Revision of left arm basilic vein fistula with transposition and placement of interposition 7 mm PTFE graft ? ?Indications: 31 year old male with multiple previous dialysis accesses now has a first stage basilic vein fistula in his upper arm which is very deep.  It is quite diminutive towards the proximal upper arm but distally it appears to have matured nicely.  He is now indicated for revision. ? ?Findings: The fistula proximally measured approximately 5 to 6 mm and had very good flow.  Unfortunately in the axilla the fistula had multiple's very small branches appeared to collateralized to the deep system and this was not going to be a suitable fistula to transposed.  There was a previous graft that was identified in the upper arm and this was transected I attempted thrombectomy and there were multiple stents that were removed and the graft and ultimately had to oversew the vein towards the axilla.  I then identified a large vein deep to the artery although there was significant scar tissue around this it appeared to be suitable for placement of a graft in an end-to-side fashion.  Ultimately the fistula was used for approximately 8 cm and then an interposition graft was placed from that part of the fistula and end to side to the axillary vein.  At completion there was some pulsatility in the graft but there was very brisk flow by Doppler and there was a palpable radial artery pulse at the wrist confirmed with Doppler. ?  ?Procedure:  The patient was identified in the holding area and taken to the operating was placed supine operative table and LMA anesthesia was induced.  He was sterilely prepped and draped in the left upper extremity usual fashion, antibiotics were  minister timeout was called.  Ultrasound was used to identify the basilic vein which I could not really traced through the upper arm because of the multiple previous graft and scar tissue there.  I made an incision above the antecubitum dissected down to the fistula.  The nerve was protected throughout its course although there was one small branch divided for better exposure.  Made 2 additional counterincisions to get to the axilla dissecting the vein out totally.  Unfortunately in the axilla the vein had multiple branches and was quite diminutive.  I then began dissecting out a previous graft and transected this graft.  There were multiple stents in there.  I ultimately remove the stents and passed a Fogarty proximally but I could not get it to pass.  I did cause some bleeding more cephalad and I oversewed this with 5-0 Prolene suture.  As I began exposing deeper identified further grafts appear to be a loop graft leading back to the artery.  This was transected and oversewn back towards the artery.  Deep to this I did identify a very large axillary vein.  I was able to get control around this.  I then transected the fistula up to where it was very good using only about 8 cm of a.  I flushed with heparinized saline and clamped it.  I sewed a 7 mm graft end-to-end to the fistula.  This was then tunneled maintaining orientation to the axilla just under the skin.  I then clamped the axillary vein with a side-biting clamp and trimmed the graft to  size and sewed in place end-to-side to the vein using 6-0 Prolene suture.  Prior to this completion we allowed flushing all directions.  Upon completion there was good flow through the graft although there was some pulsatility but very good flow in the axillary vein by Doppler that was graft dependent and there was a palpable radial artery pulse at the wrist also identified with Doppler.  I irrigated all the wounds and closed in layers with Vicryl and Monocryl.  Dermabond was  placed at the skin level.  Patient was then awakened from anesthesia having tolerated procedure without immediate complication.  All counts were correct at completion. ? ?Given the complexity of the case,  the assistant was necessary in order to expedite the procedure and safely perform the technical aspects of the operation.  The assistant provided traction and countertraction to assist with exposure of the graft and the vein.  They also assisted with suture ligation of multiple venous branches of the basilic vein and with tunneling of the graft and suturing the graft to the basilic vein fistula and to the axillary vein in an end-to-side fashion.  They played a critical role for both anastomoses.. These skills, especially following the Prolene suture for the anastomosis, could not have been adequately performed by a scrub tech assistant.  ? ? ?EBL: 250cc ? ?[name], MD ?[facility] ?Office: [phone] ?Pager: [phone] ? ? ?

## 2021-08-20 NOTE — Anesthesia Procedure Notes (Addendum)
Procedure Name: LMA Insertion ?Date/Time: 08/20/2021 11:41 AM ?Performed by: Gayland Curry, CRNA ?Pre-anesthesia Checklist: Patient identified, Emergency Drugs available, Suction available and Patient being monitored ?Patient Re-evaluated:Patient Re-evaluated prior to induction ?Oxygen Delivery Method: Circle system utilized ?Preoxygenation: Pre-oxygenation with 100% oxygen ?Induction Type: IV induction ?LMA: LMA inserted ?LMA Size: 5.0 ?Number of attempts: 1 ?Placement Confirmation: positive ETCO2 and breath sounds checked- equal and bilateral ?Tube secured with: Tape ?Dental Injury: Teeth and Oropharynx as per pre-operative assessment  ?Comments: Mask ventilation not attempted ? ? ? ? ?

## 2021-08-20 NOTE — Transfer of Care (Signed)
Immediate Anesthesia Transfer of Care Note ? ?Patient: Paul [name] ? ?Procedure(s) Performed: LEFT ARM SECOND STAGE WITH ARTERIOVENOUS GRAFT (Left) ? ?Patient Location: PACU ? ?Anesthesia Type:General ? ?Level of Consciousness: awake and drowsy ? ?Airway & Oxygen Therapy: Patient Spontanous Breathing ? ?Post-op Assessment: Report given to RN and Post -op Vital signs reviewed and stable ? ?Post vital signs: Reviewed and stable ? ?Last Vitals:  ?Vitals Value Taken Time  ?BP 118/48 08/20/21 1426  ?Temp    ?Pulse 101 08/20/21 1428  ?Resp 20 08/20/21 1428  ?SpO2 93 % 08/20/21 1428  ?Vitals shown include unvalidated device data. ? ?Last Pain:  ?Vitals:  ? 08/20/21 0929  ?TempSrc:   ?PainSc: 0-No pain  ?   ? ?  ? ?Complications: No notable events documented. ?

## 2021-08-20 NOTE — Interval H&P Note (Signed)
History and Physical Interval Note: ? ?08/20/2021 ?11:17 AM ? ?Paul [name]  has presented today for surgery, with the diagnosis of ESRD.  The various methods of treatment have been discussed with the patient and family. After consideration of risks, benefits and other options for treatment, the patient has consented to  Procedure(s): ?LEFT ARM SECOND STAGE BASILIC VEIN TRANSPOSITION (Left) as a surgical intervention.  The patient's history has been reviewed, patient examined, no change in status, stable for surgery.  I have reviewed the patient's chart and labs.  Questions were answered to the patient's satisfaction.   ? ? ?[name] ? ? ?

## 2021-08-20 NOTE — Anesthesia Postprocedure Evaluation (Signed)
Anesthesia Post Note ? ?Patient: Paul [name] ? ?Procedure(s) Performed: LEFT ARM SECOND STAGE WITH ARTERIOVENOUS GRAFT (Left) ? ?  ? ?Patient location during evaluation: PACU ?Anesthesia Type: General ?Level of consciousness: awake and alert ?Pain management: pain level controlled ?Vital Signs Assessment: post-procedure vital signs reviewed and stable ?Respiratory status: spontaneous breathing, nonlabored ventilation and respiratory function stable ?Cardiovascular status: blood pressure returned to baseline ?Postop Assessment: no apparent nausea or vomiting ?Anesthetic complications: no ? ? ?No notable events documented. ? ?Last Vitals:  ?Vitals:  ? 08/20/21 1426 08/20/21 1456  ?BP: (!) 118/48 136/65  ?Pulse: (!) 101 99  ?Resp: (!) 23 17  ?Temp: 36.8 ?C 36.8 ?C  ?SpO2: 97% 96%  ?  ?Last Pain:  ?Vitals:  ? 08/20/21 1456  ?TempSrc:   ?PainSc: 4   ? ? ?  ?  ?  ?  ?  ?  ? ?Marthenia Rolling ? ? ? ? ?

## 2021-08-21 ENCOUNTER — Telehealth: Payer: Self-pay | Admitting: Surgery

## 2021-08-21 DIAGNOSIS — L089 Local infection of the skin and subcutaneous tissue, unspecified: Secondary | ICD-10-CM | POA: Diagnosis not present

## 2021-08-21 DIAGNOSIS — E11628 Type 2 diabetes mellitus with other skin complications: Secondary | ICD-10-CM | POA: Diagnosis not present

## 2021-08-21 NOTE — Telephone Encounter (Signed)
Patient called with numbness in index finger and middle finger.  He had a fistula yesterday with Dr. Donzetta Matters.  He says his hand is warm, and he has no weakness.  I told him this seams more like nerve injury rather than steal  I told him to monitor it and if it gets worse to call me back ? ?Paul [name] ?

## 2021-08-23 ENCOUNTER — Encounter (HOSPITAL_COMMUNITY): Payer: Self-pay | Admitting: Vascular Surgery

## 2021-08-24 ENCOUNTER — Telehealth: Payer: Self-pay | Admitting: Pharmacist

## 2021-08-24 ENCOUNTER — Ambulatory Visit: Payer: Self-pay

## 2021-08-24 NOTE — Patient Outreach (Signed)
? ? ? ?  08/24/2021 ?Name: Laymond Postle MRN: [id number] DOB: Sep 24, 1990 ? ? ?Attempted to contact patient for pharmacy visit for medication management support. Patient's number did not have voicemail set up. Called his other contact, the person that answered said they were his mother. I gave her my direct number and asked her to ask the patient to call me back at his convenience.  ? ?Catie Hedwig Morton, PharmD, BCACP ?[place] ?[phone] ? ?

## 2021-08-25 ENCOUNTER — Encounter (HOSPITAL_COMMUNITY): Payer: Medicaid Other

## 2021-08-25 ENCOUNTER — Encounter: Payer: Medicaid Other | Admitting: Family

## 2021-09-06 ENCOUNTER — Other Ambulatory Visit: Payer: Self-pay | Admitting: *Deleted

## 2021-09-06 NOTE — Patient Instructions (Signed)
Visit Information ? ?Mr. [name]  - as a part of your Medicaid benefit, you are eligible for care management and care coordination services at no cost or copay. I was unable to reach you by phone today but would be happy to help you with your health related needs. Please feel free to call me @ [phone].  ? ?A member of the Managed Medicaid care management team will reach out to you again over the next 14 days.  ? ?Lurena Joiner RN, BSN ?[place] ?RN Care Coordinator ?  ?

## 2021-09-06 NOTE — Patient Outreach (Signed)
Care Coordination ? ?09/06/2021 ? ?[name] ?08/23/1990 ?[id number] ? ? ?Medicaid Managed Care  ? ?Unsuccessful Outreach Note ? ?09/06/2021 ?Name: Paul [name] MRN: [id number] DOB: 1990-12-21 ? ?Referred by: Orvis Brill, DO ?Reason for referral : High Risk Managed Medicaid (Unsuccessful RNCM follow up telephone outreach) ? ? ?An unsuccessful telephone outreach was attempted today. The patient was referred to the case management team for assistance with care management and care coordination.  ? ?Follow Up Plan: The care management team will reach out to the patient again over the next 14 days.  ? ?Lurena Joiner RN, BSN ?[place] ?RN Care Coordinator ? ? ?

## 2021-09-07 ENCOUNTER — Other Ambulatory Visit: Payer: Self-pay | Admitting: Student

## 2021-09-07 DIAGNOSIS — Z794 Long term (current) use of insulin: Secondary | ICD-10-CM

## 2021-09-08 ENCOUNTER — Encounter: Payer: Self-pay | Admitting: Vascular Surgery

## 2021-09-08 ENCOUNTER — Ambulatory Visit (INDEPENDENT_AMBULATORY_CARE_PROVIDER_SITE_OTHER): Payer: Medicaid Other | Admitting: Vascular Surgery

## 2021-09-08 VITALS — BP 132/70 | HR 104 | Temp 97.9°F | Resp 20 | Ht 74.0 in | Wt 323.0 lb

## 2021-09-08 DIAGNOSIS — Z992 Dependence on renal dialysis: Secondary | ICD-10-CM

## 2021-09-08 DIAGNOSIS — N186 End stage renal disease: Secondary | ICD-10-CM

## 2021-09-08 MED ORDER — GABAPENTIN 100 MG PO CAPS: 100.0000 mg | ORAL_CAPSULE | Freq: Three times a day (TID) | ORAL | 3 refills | Status: DC

## 2021-09-08 NOTE — Progress Notes (Signed)
?  ? ?  Subjective:  ?  ? Patient ID: Paul [name], male   DOB: 1991/03/24, 31 y.o.   MRN: [id number] ? ?HPI 31 year old male recently underwent revision of the left arm basilic vein fistula placement of an interposition PTFE graft.  He continues to dialyze via right IJ catheter.  He does have some numbness and tingling of the left thumb index and middle finger the other fingers and hand are spared.  His grip strength is preserved he does not feel weak in the hand.  His wounds are all healing well. ? ? ?Review of Systems ?Numbness as above ?   ?Objective:  ? Physical Exam ?Vitals:  ? 09/08/21 0853  ?BP: 132/70  ?Pulse: (!) 104  ?Resp: 20  ?Temp: 97.9 ?F (36.6 ?C)  ?SpO2: 99%  ? ?Awake alert oriented ?Nonlabored respirations ?Right IJ catheter in place without any evidence of infection ?Left upper arm AV graft is palpable with minimal pulsatility and is superficial enough for use ?There is a strong left radial artery signal which minimally augments with compression of the graft and there is a strong palmar arch signal with both compression and without compression of the graft. ?Left grip strength 5 out of 5 ?   ?Assessment:  ?   ?31 year old male status post superficialization of left basilic vein fistula with interposition grafting now with radial nerve distribution numbness but with preserved grip strength ?   ?Plan:  ?   ?I have refilled his Neurontin ?Okay to use graft in 1 week  ?F/u /prn ?   ? ?Troy Hartzog C. Donzetta Matters, MD ?[facility] ?Office: [phone] ?Pager: [phone] ? ?

## 2021-09-14 ENCOUNTER — Other Ambulatory Visit: Payer: Self-pay | Admitting: *Deleted

## 2021-09-14 NOTE — Patient Outreach (Signed)
Care Coordination ? ?09/14/2021 ? ?[name] ?[birth year] ?[id number] ? ? ?Medicaid Managed Care  ? ?Unsuccessful Outreach Note ? ?09/14/2021 ?Name: Paul [name] MRN: [id number] DOB: [birth year] ? ?Referred by: Orvis Brill, DO ?Reason for referral : High Risk Managed Medicaid (Unsuccessful RNCM follow up telephone outreach) ? ? ?Third unsuccessful telephone outreach was attempted today. The patient was referred to the case management team for assistance with care management and care coordination. The patient's primary care provider has been notified of our unsuccessful attempts to make or maintain contact with the patient. The care management team is pleased to engage with this patient at any time in the future should he/she be interested in assistance from the care management team.  ? ?Follow Up Plan: We have been unable to make contact with the patient for follow up. The care management team is available to follow up with the patient after provider conversation with the patient regarding recommendation for care management engagement and subsequent re-referral to the care management team.  ? ?Lurena Joiner RN, BSN ?[place] ?RN Care Coordinator ? ? ?

## 2021-09-19 DIAGNOSIS — N186 End stage renal disease: Secondary | ICD-10-CM | POA: Diagnosis not present

## 2021-09-19 DIAGNOSIS — Z992 Dependence on renal dialysis: Secondary | ICD-10-CM | POA: Diagnosis not present

## 2021-09-19 DIAGNOSIS — E1129 Type 2 diabetes mellitus with other diabetic kidney complication: Secondary | ICD-10-CM | POA: Diagnosis not present

## 2021-09-20 ENCOUNTER — Other Ambulatory Visit: Payer: Self-pay

## 2021-09-20 DIAGNOSIS — E11628 Type 2 diabetes mellitus with other skin complications: Secondary | ICD-10-CM | POA: Diagnosis not present

## 2021-09-20 DIAGNOSIS — L089 Local infection of the skin and subcutaneous tissue, unspecified: Secondary | ICD-10-CM | POA: Diagnosis not present

## 2021-09-20 MED ORDER — APIXABAN 5 MG PO TABS
5.0000 mg | ORAL_TABLET | Freq: Two times a day (BID) | ORAL | 2 refills | Status: DC
Start: 2021-09-20 — End: 2021-11-10

## 2021-09-21 NOTE — Progress Notes (Signed)
?SUBJECTIVE:  ? ?CHIEF COMPLAINT / HPI:  ? ?ESRD on HD (T/Th/Sa): No trouble currently.  He is trying to lose weight currently so he can get on the transplant list. He struggled for a while after he had a toe removed but has improved with his movement since then. He is refocusing his goals and trying to get off of dialysis. He is attempting to get rid of sodas and eating more homecooked meals instead of fast food. He would like to know if there is a kidney support group. He has a Arts development officer given to him by the dialysis center for a group of this. He would also like to know if there is an overeaters anonymous group.  ? ?Left hand pain: Has some pain in his hand which is being worked up by vascular.  The pain began directly after his graft placement.  He states the pain is mostly felt when he lays down and rests. The pain is in the distribution of his 1st-3rd fingers of his left hand. Gabapentin has not really helped and he has been taking it for 2 weeks. ? ?Hypothyroidism: He takes 185mcg of his Synthroid instead of 75.  ? ?T2DM: A1c 6.2. Glucose at home usually runs in the 150s.  Requesting refill of strips today. ? ?PERTINENT  PMH / PSH: HLD, HTN, hypothyroidism, ESRD on HD, T2DM, GERD, morbid obesity, anemia of chronic disease ? ?OBJECTIVE:  ?BP 123/79   Pulse 97   Ht 6\' 2"  (1.88 m)   Wt (!) 331 lb 12.8 oz (150.5 kg)   SpO2 99%   BMI 42.60 kg/m?  ? ?General: NAD, pleasant, able to participate in exam ?Cardiac: RRR, no murmurs auscultated. ?Respiratory: CTAB, normal effort, no wheezes, rales or rhonchi ?Extremities: warm and well perfused, no edema or cyanosis, graft with appropriate bounding pulse, negative Tinel's and phalen sign ?Skin: warm and dry, no rashes noted ?Neuro: alert, no obvious focal deficits, speech normal ?Psych: Normal affect and mood ? ?ASSESSMENT/PLAN:  ?Hypothyroidism ?Currently taking double his prescribed Synthroid dose at 150 mcg.  May continue for now but check TSH and adjust  accordingly. ? ?Type 2 diabetes mellitus ([place]) ?A1c 6.2 although could be false given ESRD on HD. Glucose appears well controlled at home.  Continue with NovoLog 10 units twice daily. ? ?ESRD on dialysis [facility]) ?Encouraged patient to continue healthier diet and exercise as tolerable to lose weight.  Provided form for online overeaters Anonymous group.  Patient amenable to being connected with behavioral health professional, message sent to Dr. Hartford Poli.  ? ?Left hand pain ?Recently developed directly after graft placement.  Pain experience more frequently at night of the 1st-3rd fingers of left hand.  Gabapentin not improving.  Denies flick test.  Recommend carpal tunnel brace.  May consider muscle relaxer given more frequent at night although do not suspect will benefit from this.  More likely related to graft placement. ? ?Hyperlipidemia ?Ordered lipid panel today.  Crestor adjusted to 20 mg daily given ESRD dosage adjustment.  Refilled medication at adjusted dose. ?  ?Orders Placed This Encounter  ?Procedures  ? TSH  ? Lipid Panel  ? POCT glycosylated hemoglobin (Hb A1C)  ? ?Meds ordered this encounter  ?Medications  ? glucose blood (ACCU-CHEK GUIDE) test strip  ?  Sig: 1 each by Other route 2 (two) times daily. Use as instructed  ?  Dispense:  100 each  ?  Refill:  12  ? rosuvastatin (CRESTOR) 20 MG tablet  ?  Sig: Take  1 tablet (20 mg total) by mouth daily.  ?  Dispense:  90 tablet  ?  Refill:  0  ? ?No follow-ups on file. ?Wells Guiles, DO ?09/22/2021, 5:15 PM ?PGY-1, [place] ? ?

## 2021-09-22 ENCOUNTER — Encounter: Payer: Self-pay | Admitting: Student

## 2021-09-22 ENCOUNTER — Ambulatory Visit (INDEPENDENT_AMBULATORY_CARE_PROVIDER_SITE_OTHER): Payer: Medicaid Other | Admitting: Student

## 2021-09-22 VITALS — BP 123/79 | HR 97 | Ht 74.0 in | Wt 331.8 lb

## 2021-09-22 DIAGNOSIS — N186 End stage renal disease: Secondary | ICD-10-CM | POA: Diagnosis not present

## 2021-09-22 DIAGNOSIS — Z992 Dependence on renal dialysis: Secondary | ICD-10-CM | POA: Diagnosis not present

## 2021-09-22 DIAGNOSIS — E785 Hyperlipidemia, unspecified: Secondary | ICD-10-CM | POA: Diagnosis not present

## 2021-09-22 DIAGNOSIS — M79642 Pain in left hand: Secondary | ICD-10-CM

## 2021-09-22 DIAGNOSIS — E039 Hypothyroidism, unspecified: Secondary | ICD-10-CM | POA: Diagnosis not present

## 2021-09-22 DIAGNOSIS — E1122 Type 2 diabetes mellitus with diabetic chronic kidney disease: Secondary | ICD-10-CM

## 2021-09-22 DIAGNOSIS — Z794 Long term (current) use of insulin: Secondary | ICD-10-CM | POA: Diagnosis not present

## 2021-09-22 LAB — POCT GLYCOSYLATED HEMOGLOBIN (HGB A1C): HbA1c, POC (controlled diabetic range): 6.2 % (ref 0.0–7.0)

## 2021-09-22 MED ORDER — ROSUVASTATIN CALCIUM 20 MG PO TABS: 20.0000 mg | ORAL_TABLET | Freq: Every day | ORAL | 0 refills | Status: DC

## 2021-09-22 MED ORDER — ACCU-CHEK GUIDE VI STRP: 1.0000 | ORAL_STRIP | Freq: Two times a day (BID) | 12 refills | Status: DC

## 2021-09-22 NOTE — Assessment & Plan Note (Signed)
Recently developed directly after graft placement.  Pain experience more frequently at night of the 1st-3rd fingers of left hand.  Gabapentin not improving.  Denies flick test.  Recommend carpal tunnel brace.  May consider muscle relaxer given more frequent at night although do not suspect will benefit from this.  More likely related to graft placement. ?

## 2021-09-22 NOTE — Assessment & Plan Note (Addendum)
Encouraged patient to continue healthier diet and exercise as tolerable to lose weight.  Provided form for online overeaters Anonymous group.  Patient amenable to being connected with behavioral health professional, message sent to Dr. Hartford Poli.  ?

## 2021-09-22 NOTE — Assessment & Plan Note (Addendum)
Ordered lipid panel today.  Crestor adjusted to 20 mg daily given ESRD dosage adjustment.  Refilled medication at adjusted dose. ?

## 2021-09-22 NOTE — Assessment & Plan Note (Signed)
A1c 6.2 although could be false given ESRD on HD. Glucose appears well controlled at home.  Continue with NovoLog 10 units twice daily. ?

## 2021-09-22 NOTE — Assessment & Plan Note (Signed)
Currently taking double his prescribed Synthroid dose at 150 mcg.  May continue for now but check TSH and adjust accordingly. ?

## 2021-09-22 NOTE — Patient Instructions (Signed)
It was great to see you today! Thank you for choosing [facility] for your primary care. Paul [name] was seen for medication management. ? ?Today we addressed: ?Hypothyroidism: Rechecking your TSH today.  We may need to adjust your medication amount based on this value. ?Hyperlipidemia: We are rechecking your lipid panel today.  I have decreased your rosuvastatin (Crestor) to 20 mg daily as this needs to be appropriately dosed for your kidney disease. ?Carpal tunnel: Given the distribution and frequency of your hand pain, I would advise a carpal tunnel brace.  These are easily obtained at grocery stores or [employer]. ?ESRD: You are doing excellent with attempting to make lifestyle changes to lose weight and eventually get off dialysis through a kidney transplantation.  I will reach out to our other services so that we may provide you some assistance. ? ? ?Orders Placed This Encounter  ?Procedures  ? TSH  ? Lipid Panel  ? POCT glycosylated hemoglobin (Hb A1C)  ? ?Meds ordered this encounter  ?Medications  ? glucose blood (ACCU-CHEK GUIDE) test strip  ?  Sig: 1 each by Other route 2 (two) times daily. Use as instructed  ?  Dispense:  100 each  ?  Refill:  12  ? rosuvastatin (CRESTOR) 20 MG tablet  ?  Sig: Take 1 tablet (20 mg total) by mouth daily.  ?  Dispense:  90 tablet  ?  Refill:  0  ? ?We are checking some labs today. If they are abnormal, I will call you. If they are normal, I will send you a MyChart message (if it is active) or a letter in the mail. If you do not hear about your labs in the next 2 weeks, please call the office. ? ?You should return to our clinic No follow-ups on file.. ? ?I recommend that you always bring your medications to each appointment as this makes it easy to ensure you are on the correct medications and helps [place] not miss refills when you need them. ? ?Please arrive 15 minutes before your appointment to ensure smooth check in process.  We appreciate your efforts in making this  happen. ? ?Take care and seek immediate care sooner if you develop any concerns.  ? ?Thank you for allowing me to participate in your care, ?Wells Guiles, DO ?09/22/2021, 9:28 AM ?PGY-1, [place] ?  ?

## 2021-09-23 ENCOUNTER — Other Ambulatory Visit: Payer: Self-pay | Admitting: Student

## 2021-09-23 DIAGNOSIS — E039 Hypothyroidism, unspecified: Secondary | ICD-10-CM

## 2021-09-23 DIAGNOSIS — E1122 Type 2 diabetes mellitus with diabetic chronic kidney disease: Secondary | ICD-10-CM

## 2021-09-23 LAB — LIPID PANEL
Chol/HDL Ratio: 3.3 ratio (ref 0.0–5.0)
Cholesterol, Total: 108 mg/dL (ref 100–199)
HDL: 33 mg/dL — ABNORMAL LOW (ref >39)
LDL Chol Calc (NIH): 46 mg/dL (ref 0–99)
Triglycerides: 170 mg/dL — ABNORMAL HIGH (ref 0–149)
VLDL Cholesterol Cal: 29 mg/dL (ref 5–40)

## 2021-09-23 LAB — TSH: TSH: 7.19 u[IU]/mL — ABNORMAL HIGH (ref 0.450–4.500)

## 2021-09-23 MED ORDER — LEVOTHYROXINE SODIUM 75 MCG PO TABS: 150.0000 ug | ORAL_TABLET | Freq: Every day | ORAL | 0 refills | Status: DC

## 2021-09-24 ENCOUNTER — Telehealth: Payer: Self-pay | Admitting: Psychology

## 2021-09-24 NOTE — Telephone Encounter (Signed)
Called pt to scheduled BH appt. Pt reported he would call back to schedule. ? ? ?Erlinda Hong, PhD., LMFT ? ?

## 2021-09-26 ENCOUNTER — Other Ambulatory Visit: Payer: Self-pay | Admitting: Student

## 2021-09-26 DIAGNOSIS — E039 Hypothyroidism, unspecified: Secondary | ICD-10-CM

## 2021-09-26 DIAGNOSIS — E785 Hyperlipidemia, unspecified: Secondary | ICD-10-CM

## 2021-09-30 ENCOUNTER — Other Ambulatory Visit (HOSPITAL_COMMUNITY): Payer: Self-pay | Admitting: Nephrology

## 2021-09-30 DIAGNOSIS — N186 End stage renal disease: Secondary | ICD-10-CM

## 2021-10-06 ENCOUNTER — Other Ambulatory Visit: Payer: Self-pay | Admitting: Student

## 2021-10-06 ENCOUNTER — Telehealth: Payer: Self-pay

## 2021-10-06 ENCOUNTER — Ambulatory Visit (HOSPITAL_COMMUNITY)
Admission: RE | Admit: 2021-10-06 | Discharge: 2021-10-06 | Disposition: A | Discharge status: Home / Self Care | Payer: Medicaid Other | Source: Ambulatory Visit | Attending: Nephrology | Admitting: Nephrology

## 2021-10-06 DIAGNOSIS — Z4901 Encounter for fitting and adjustment of extracorporeal dialysis catheter: Secondary | ICD-10-CM | POA: Insufficient documentation

## 2021-10-06 DIAGNOSIS — N186 End stage renal disease: Secondary | ICD-10-CM | POA: Diagnosis not present

## 2021-10-06 DIAGNOSIS — E039 Hypothyroidism, unspecified: Secondary | ICD-10-CM

## 2021-10-06 HISTORY — PX: IR REMOVAL TUN CV CATH W/O FL: IMG2289

## 2021-10-06 MED ORDER — CHLORHEXIDINE GLUCONATE 4 % EX LIQD
CUTANEOUS | Status: AC
  Filled 2021-10-06: qty 15

## 2021-10-06 MED ORDER — LIDOCAINE HCL 1 % IJ SOLN
INTRAMUSCULAR | Status: AC
  Filled 2021-10-06: qty 20

## 2021-10-06 MED ORDER — LIDOCAINE HCL (PF) 1 % IJ SOLN
INTRAMUSCULAR | Status: DC | PRN
  Administered 2021-10-06: 10 mL

## 2021-10-06 MED ORDER — LEVOTHYROXINE SODIUM 150 MCG PO TABS: 150.0000 ug | ORAL_TABLET | Freq: Every day | ORAL | 2 refills | Status: DC

## 2021-10-06 NOTE — Telephone Encounter (Signed)
Sent Rx for Synthroid 150 mcg daily for 90-day supply ?

## 2021-10-06 NOTE — Telephone Encounter (Signed)
Patients mother calls nurse line requesting a new script for Levothyroxine 150. ? ?Mother reports he would prefer to take one per day vs having to take 2.  ? ?Recent prescription was only for a 15 day supply. ? ?Will forward to provider who saw patient.  ?

## 2021-10-11 ENCOUNTER — Other Ambulatory Visit: Payer: Self-pay | Admitting: Student

## 2021-10-11 DIAGNOSIS — E785 Hyperlipidemia, unspecified: Secondary | ICD-10-CM

## 2021-10-20 DIAGNOSIS — N186 End stage renal disease: Secondary | ICD-10-CM | POA: Diagnosis not present

## 2021-10-20 DIAGNOSIS — Z992 Dependence on renal dialysis: Secondary | ICD-10-CM | POA: Diagnosis not present

## 2021-10-20 DIAGNOSIS — E1129 Type 2 diabetes mellitus with other diabetic kidney complication: Secondary | ICD-10-CM | POA: Diagnosis not present

## 2021-10-21 DIAGNOSIS — E11628 Type 2 diabetes mellitus with other skin complications: Secondary | ICD-10-CM | POA: Diagnosis not present

## 2021-10-21 DIAGNOSIS — L089 Local infection of the skin and subcutaneous tissue, unspecified: Secondary | ICD-10-CM | POA: Diagnosis not present

## 2021-10-26 ENCOUNTER — Encounter: Payer: Self-pay | Admitting: *Deleted

## 2021-11-08 ENCOUNTER — Other Ambulatory Visit: Payer: Self-pay | Admitting: Student

## 2021-11-08 DIAGNOSIS — E039 Hypothyroidism, unspecified: Secondary | ICD-10-CM

## 2021-11-10 ENCOUNTER — Other Ambulatory Visit: Payer: Self-pay | Admitting: Student

## 2021-11-19 DIAGNOSIS — E1129 Type 2 diabetes mellitus with other diabetic kidney complication: Secondary | ICD-10-CM | POA: Diagnosis not present

## 2021-11-19 DIAGNOSIS — Z992 Dependence on renal dialysis: Secondary | ICD-10-CM | POA: Diagnosis not present

## 2021-11-19 DIAGNOSIS — N186 End stage renal disease: Secondary | ICD-10-CM | POA: Diagnosis not present

## 2021-11-20 DIAGNOSIS — L089 Local infection of the skin and subcutaneous tissue, unspecified: Secondary | ICD-10-CM | POA: Diagnosis not present

## 2021-11-20 DIAGNOSIS — E11628 Type 2 diabetes mellitus with other skin complications: Secondary | ICD-10-CM | POA: Diagnosis not present

## 2021-12-06 ENCOUNTER — Other Ambulatory Visit: Payer: Self-pay | Admitting: Student

## 2021-12-06 DIAGNOSIS — Z794 Long term (current) use of insulin: Secondary | ICD-10-CM

## 2021-12-20 DIAGNOSIS — N186 End stage renal disease: Secondary | ICD-10-CM | POA: Diagnosis not present

## 2021-12-20 DIAGNOSIS — Z992 Dependence on renal dialysis: Secondary | ICD-10-CM | POA: Diagnosis not present

## 2021-12-20 DIAGNOSIS — E1129 Type 2 diabetes mellitus with other diabetic kidney complication: Secondary | ICD-10-CM | POA: Diagnosis not present

## 2021-12-21 DIAGNOSIS — L089 Local infection of the skin and subcutaneous tissue, unspecified: Secondary | ICD-10-CM | POA: Diagnosis not present

## 2021-12-21 DIAGNOSIS — E11628 Type 2 diabetes mellitus with other skin complications: Secondary | ICD-10-CM | POA: Diagnosis not present

## 2021-12-27 ENCOUNTER — Other Ambulatory Visit: Payer: Self-pay | Admitting: Student

## 2021-12-27 DIAGNOSIS — E039 Hypothyroidism, unspecified: Secondary | ICD-10-CM

## 2021-12-31 ENCOUNTER — Other Ambulatory Visit: Payer: Self-pay | Admitting: Student

## 2022-01-06 ENCOUNTER — Other Ambulatory Visit: Payer: Self-pay | Admitting: Student

## 2022-01-06 DIAGNOSIS — E785 Hyperlipidemia, unspecified: Secondary | ICD-10-CM

## 2022-01-20 ENCOUNTER — Other Ambulatory Visit: Payer: Self-pay | Admitting: Student

## 2022-01-20 DIAGNOSIS — Z992 Dependence on renal dialysis: Secondary | ICD-10-CM | POA: Diagnosis not present

## 2022-01-20 DIAGNOSIS — N186 End stage renal disease: Secondary | ICD-10-CM | POA: Diagnosis not present

## 2022-01-20 DIAGNOSIS — E1129 Type 2 diabetes mellitus with other diabetic kidney complication: Secondary | ICD-10-CM | POA: Diagnosis not present

## 2022-01-21 DIAGNOSIS — E11628 Type 2 diabetes mellitus with other skin complications: Secondary | ICD-10-CM | POA: Diagnosis not present

## 2022-01-21 DIAGNOSIS — L089 Local infection of the skin and subcutaneous tissue, unspecified: Secondary | ICD-10-CM | POA: Diagnosis not present

## 2022-01-27 ENCOUNTER — Other Ambulatory Visit (HOSPITAL_COMMUNITY): Payer: Self-pay | Admitting: Internal Medicine

## 2022-01-27 DIAGNOSIS — N186 End stage renal disease: Secondary | ICD-10-CM

## 2022-01-30 ENCOUNTER — Other Ambulatory Visit: Payer: Self-pay | Admitting: Student

## 2022-01-30 DIAGNOSIS — E1129 Type 2 diabetes mellitus with other diabetic kidney complication: Secondary | ICD-10-CM

## 2022-01-31 ENCOUNTER — Encounter (HOSPITAL_COMMUNITY): Payer: Self-pay

## 2022-01-31 ENCOUNTER — Emergency Department (HOSPITAL_COMMUNITY)
Admission: EM | Admit: 2022-01-31 | Discharge: 2022-01-31 | Disposition: A | Discharge status: Home / Self Care | Payer: Medicaid Other | Attending: Emergency Medicine | Admitting: Emergency Medicine

## 2022-01-31 ENCOUNTER — Other Ambulatory Visit: Payer: Self-pay

## 2022-01-31 ENCOUNTER — Emergency Department (HOSPITAL_BASED_OUTPATIENT_CLINIC_OR_DEPARTMENT_OTHER): Payer: Medicaid Other

## 2022-01-31 DIAGNOSIS — Z992 Dependence on renal dialysis: Secondary | ICD-10-CM | POA: Insufficient documentation

## 2022-01-31 DIAGNOSIS — Z452 Encounter for adjustment and management of vascular access device: Secondary | ICD-10-CM | POA: Diagnosis present

## 2022-01-31 DIAGNOSIS — T82898A Other specified complication of vascular prosthetic devices, implants and grafts, initial encounter: Secondary | ICD-10-CM | POA: Diagnosis not present

## 2022-01-31 DIAGNOSIS — Z79899 Other long term (current) drug therapy: Secondary | ICD-10-CM | POA: Insufficient documentation

## 2022-01-31 DIAGNOSIS — D72829 Elevated white blood cell count, unspecified: Secondary | ICD-10-CM | POA: Diagnosis not present

## 2022-01-31 DIAGNOSIS — T82868A Thrombosis of vascular prosthetic devices, implants and grafts, initial encounter: Secondary | ICD-10-CM | POA: Insufficient documentation

## 2022-01-31 DIAGNOSIS — E039 Hypothyroidism, unspecified: Secondary | ICD-10-CM | POA: Insufficient documentation

## 2022-01-31 DIAGNOSIS — N186 End stage renal disease: Secondary | ICD-10-CM | POA: Diagnosis not present

## 2022-01-31 DIAGNOSIS — Y828 Other medical devices associated with adverse incidents: Secondary | ICD-10-CM | POA: Insufficient documentation

## 2022-01-31 DIAGNOSIS — E1122 Type 2 diabetes mellitus with diabetic chronic kidney disease: Secondary | ICD-10-CM | POA: Insufficient documentation

## 2022-01-31 DIAGNOSIS — Z794 Long term (current) use of insulin: Secondary | ICD-10-CM | POA: Insufficient documentation

## 2022-01-31 DIAGNOSIS — I120 Hypertensive chronic kidney disease with stage 5 chronic kidney disease or end stage renal disease: Secondary | ICD-10-CM | POA: Insufficient documentation

## 2022-01-31 DIAGNOSIS — Z7901 Long term (current) use of anticoagulants: Secondary | ICD-10-CM | POA: Diagnosis not present

## 2022-01-31 DIAGNOSIS — I770 Arteriovenous fistula, acquired: Secondary | ICD-10-CM | POA: Diagnosis not present

## 2022-01-31 LAB — CBC WITH DIFFERENTIAL/PLATELET
Abs Immature Granulocytes: 0.07 10*3/uL (ref 0.00–0.07)
Basophils Absolute: 0.1 10*3/uL (ref 0.0–0.1)
Basophils Relative: 1 %
Eosinophils Absolute: 0.1 10*3/uL (ref 0.0–0.5)
Eosinophils Relative: 1 %
HCT: 42 % (ref 39.0–52.0)
Hemoglobin: 13.6 g/dL (ref 13.0–17.0)
Immature Granulocytes: 1 %
Lymphocytes Relative: 29 %
Lymphs Abs: 3.4 10*3/uL (ref 0.7–4.0)
MCH: 27.1 pg (ref 26.0–34.0)
MCHC: 32.4 g/dL (ref 30.0–36.0)
MCV: 83.8 fL (ref 80.0–100.0)
Monocytes Absolute: 0.7 10*3/uL (ref 0.1–1.0)
Monocytes Relative: 6 %
Neutro Abs: 7.2 10*3/uL (ref 1.7–7.7)
Neutrophils Relative %: 62 %
Platelets: 302 10*3/uL (ref 150–400)
RBC: 5.01 MIL/uL (ref 4.22–5.81)
RDW: 18.3 % — ABNORMAL HIGH (ref 11.5–15.5)
WBC: 11.6 10*3/uL — ABNORMAL HIGH (ref 4.0–10.5)
nRBC: 0 % (ref 0.0–0.2)

## 2022-01-31 LAB — BASIC METABOLIC PANEL
Anion gap: 18 — ABNORMAL HIGH (ref 5–15)
BUN: 61 mg/dL — ABNORMAL HIGH (ref 6–20)
CO2: 24 mmol/L (ref 22–32)
Calcium: 8.9 mg/dL (ref 8.9–10.3)
Chloride: 97 mmol/L — ABNORMAL LOW (ref 98–111)
Creatinine, Ser: 19.13 mg/dL — ABNORMAL HIGH (ref 0.61–1.24)
GFR, Estimated: 3 mL/min — ABNORMAL LOW (ref >60)
Glucose, Bld: 99 mg/dL (ref 70–99)
Potassium: 4.6 mmol/L (ref 3.5–5.1)
Sodium: 139 mmol/L (ref 135–145)

## 2022-01-31 NOTE — ED Provider Notes (Signed)
[facility] EMERGENCY DEPARTMENT Provider Note   CSN: [id number] Arrival date & time: 01/31/22  2130     History  Chief Complaint  Patient presents with   Vascular Access Problem    Paul [name] is a 31 y.o. male.  HPI   31 year old male with medical history significant for HTN, morbid obesity, hypothyroidism, DM 2, HLD, ESRD on dialysis via left upper extremity AV fistula Tuesday, Thursday, Saturday who presents to the emergency department with concern for his fistula clotted.  The patient states that he last got dialysis on Saturday.  He noticed this morning when he woke up that he could not palpate a thrill.  He denies any other complaints at this time, no chest pain, shortness of breath, cough, fever, chills.  Home Medications Prior to Admission medications   Medication Sig Start Date End Date Taking? Authorizing Provider  Accu-Chek Softclix Lancets lancets Use as instructed 06/11/21   Eppie Gibson, MD  acetaminophen (TYLENOL) 500 MG tablet Take 1,000 mg by mouth every 6 (six) hours as needed (for pain.).    [provider]  Blood Glucose Monitoring Suppl (ACCU-CHEK GUIDE ME) w/Device KIT 1 kit by Does not apply route daily. 05/22/21   Eppie Gibson, MD  calcium carbonate (TUMS - DOSED IN MG ELEMENTAL CALCIUM) 500 MG chewable tablet Chew 1,000-1,500 mg by mouth daily as needed for indigestion or heartburn.    [provider]  cinacalcet (SENSIPAR) 30 MG tablet Take 30 mg by mouth daily. 08/12/21   [provider]  cinacalcet (SENSIPAR) 60 MG tablet Take 60 mg by mouth daily. Patient not taking: Reported on 09/22/2021    [provider]  ELIQUIS 5 MG TABS tablet TAKE 1 TABLET BY MOUTH TWICE DAILY. START TAKING ONLY AFTER STARTER PACK HAS BEEN COMPLETED 01/20/22   Dameron, Luna Fuse, DO  ferric citrate (AURYXIA) 1 GM 210 MG(Fe) tablet Take 420-840 mg by mouth See admin instructions. Take 4 tablets (840 mg) by mouth with each meal  & take 2 tablets (420 mg) by mouth with each snack.    [provider]  gabapentin (NEURONTIN) 100 MG capsule Take 1 capsule (100 mg total) by mouth 3 (three) times daily. Patient not taking: Reported on 09/22/2021 05/14/21   Bonnielee Haff, MD  gabapentin (NEURONTIN) 100 MG capsule Take 1 capsule (100 mg total) by mouth 3 (three) times daily. 09/08/21   Waynetta Sandy, MD  glucose blood (ACCU-CHEK GUIDE) test strip 1 each by Other route 2 (two) times daily. Use as instructed 09/22/21   Wells Guiles, DO  LANTUS 100 UNIT/ML injection INJECT 10 TO 15 UNITS INTO THE SKIN TWO TIMES DAILY. PATIENT NEEDS APPOINTENT 12/06/21   Orvis Brill, DO  levothyroxine (SYNTHROID) 150 MCG tablet TAKE 1 TABLET BY MOUTH ONCE DAILY BEFORE BREAKFAST 12/27/21   Erskine Emery, MD  midodrine (PROAMATINE) 5 MG tablet Take 1 tablet (5 mg total) by mouth 3 (three) times daily with meals. Patient taking differently: Take 5 mg by mouth daily as needed (low blood pressure). 05/14/21   Bonnielee Haff, MD  multivitamin (RENA-VIT) TABS tablet Take 1 tablet by mouth at bedtime. 10/25/16   Nicolette Bang, MD  polyethylene glycol (MIRALAX / GLYCOLAX) 17 g packet Take 17 g by mouth daily as needed. Patient not taking: Reported on 09/22/2021 05/14/21   Bonnielee Haff, MD  rosuvastatin (CRESTOR) 20 MG tablet Take 1 tablet by mouth once daily 01/08/22   Orvis Brill, DO  Syringe, Disposable, (B-D  SYRINGE SLIP TIP 30CC) 30 ML MISC 1 Syringe by Does not apply route daily. 01/31/19   Wilber Oliphant, MD      Allergies    Hydralazine hcl    Review of Systems   Review of Systems  All other systems reviewed and are negative.   Physical Exam Updated Vital Signs BP 107/67 (BP Location: Right Arm)   Pulse 81   Temp 97.9 F (36.6 C) (Oral)   Resp 19   Ht 6' 2"  (1.88 m)   Wt (!) 150.1 kg   SpO2 100%   BMI 42.50 kg/m  Physical Exam Vitals and nursing note reviewed.  Constitutional:      General: He is  not in acute distress.    Appearance: He is obese.  HENT:     Head: Normocephalic and atraumatic.  Eyes:     Conjunctiva/sclera: Conjunctivae normal.     Pupils: Pupils are equal, round, and reactive to light.  Cardiovascular:     Rate and Rhythm: Normal rate and regular rhythm.  Pulmonary:     Effort: Pulmonary effort is normal. No respiratory distress.  Abdominal:     General: There is no distension.     Tenderness: There is no guarding.  Musculoskeletal:        General: No deformity or signs of injury.     Cervical back: Neck supple.     Comments: Left upper extremity AV fistula with no palpable thrill, no audible bruit, hard knot not palpated along the area of the fistula with concern for jaundice  Skin:    Findings: No lesion or rash.  Neurological:     General: No focal deficit present.     Mental Status: He is alert. Mental status is at baseline.     ED Results / Procedures / Treatments   Labs (all labs ordered are listed, but only abnormal results are displayed) Labs Reviewed  CBC WITH DIFFERENTIAL/PLATELET - Abnormal; Notable for the following components:      Result Value   WBC 11.6 (*)    RDW 18.3 (*)    All other components within normal limits  BASIC METABOLIC PANEL - Abnormal; Notable for the following components:   Chloride 97 (*)    BUN 61 (*)    Creatinine, Ser 19.13 (*)    GFR, Estimated 3 (*)    Anion gap 18 (*)    All other components within normal limits    EKG EKG Interpretation  Date/Time:  Monday January 31 2022 12:15:44 EDT Ventricular Rate:  74 PR Interval:  171 QRS Duration: 95 QT Interval:  412 QTC Calculation: 458 R Axis:   56 Text Interpretation: Sinus rhythm Confirmed by Regan Lemming ([id number]) on 01/31/2022 12:42:18 PM  Radiology VAS US DUPLEX DIALYSIS ACCESS (AVF, AVG)  Result Date: 01/31/2022 DIALYSIS ACCESS Patient Name:  Paul [name]  Date of Exam:   01/31/2022 Medical Rec #: [id number]      Accession #:    [id number] Date of  Birth: 08-18-1990       Patient Gender: M Patient Age:   39 years Exam Location:  [place] Procedure:      VAS US DUPLEX DIALYSIS ACCESS (AVF, AVG) Referring Phys: Candiss Norse --------------------------------------------------------------------------------  Reason for Exam: No palpable thrill for AVF/AVG. Access Site: Left Upper Extremity. Access Type: Brachiobasilic AVF. History: Patent graft brachiobasilic 02/13/2682. Comparison Study: 07/28/21 prior Performing Technologist: Archie Patten RVS  Examination Guidelines: A complete evaluation includes B-mode imaging, spectral Doppler, color Doppler,  and power Doppler as needed of all accessible portions of each vessel. Unilateral testing is considered an integral part of a complete examination. Limited examinations for reoccurring indications may be performed as noted.  Findings: +--------------------+----------+-----------------+------------------------+ AVF                 PSV (cm/s)Flow Vol (mL/min)        Comments         +--------------------+----------+-----------------+------------------------+ Native artery inflow    74                     triphasic native artery  +--------------------+----------+-----------------+------------------------+ AVF Anastomosis         99                     high resistance waveform +--------------------+----------+-----------------+------------------------+  +-------------+----------+-------------+----------+------------------+ OUTFLOW VEIN PSV (cm/s)Diameter (cm)Depth (cm)     Describe      +-------------+----------+-------------+----------+------------------+ Axillary vein                                       patent       +-------------+----------+-------------+----------+------------------+ Shoulder                                           occluded      +-------------+----------+-------------+----------+------------------+ Prox UA                                            occluded       +-------------+----------+-------------+----------+------------------+ Mid UA                                             occluded      +-------------+----------+-------------+----------+------------------+ Dist UA                                       preocclusive thump +-------------+----------+-------------+----------+------------------+   Summary: Arteriovenous fistula-Thrombus noted in the distal upper arm to axilla. Axillary vein appears patent. *See table(s) above for measurements and observations.    --------------------------------------------------------------------------------   Preliminary     Procedures Procedures    Medications Ordered in ED Medications - No data to display  ED Course/ Medical Decision Making/ A&P                           Medical Decision Making Amount and/or Complexity of Data Reviewed Labs: ordered.    31 year old male with medical history significant for HTN, morbid obesity, hypothyroidism, DM 2, HLD, ESRD on dialysis via left upper extremity AV fistula Tuesday, Thursday, Saturday who presents to the emergency department with concern for his fistula clotted.  The patient states that he last got dialysis on Saturday.  He noticed this morning when he woke up that he could not palpate a thrill.  He denies any other complaints at this time, no chest pain, shortness of breath, cough, fever, chills.  On arrival, patient was vitally stable.  Physical exam concerning for a  clotted left upper extremity AV fistula graft with no audible bruit, no palpable thrill.  Vascular surgery consulted as on chart review Dr. Donzetta Matters was performed his graft.  Patient EKG revealed sinus rhythm, ventricular rate 74, no T wave abnormalities, no abnormal intervals.  Evaluation significant for mild leukocytosis to 11.6, no anemia, BMP without significant electrolyte abnormality with normal potassium to 4.6.  The patient's BUN and creatinine was found to be 61, 19.  IR was  consulted for further recommendations.  Ultrasound was performed which confirmed occlusion of the patient's graft. Summary:  Arteriovenous fistula-Thrombus noted in the distal upper arm to axilla. Axillary  vein appears patent.   Plan for re-cannulation with IR today. Signout given to Dr. Matilde Sprang at [phone].  Final Clinical Impression(s) / ED Diagnoses Final diagnoses:  AV fistula occlusion, initial encounter [facility])    Rx / DC Orders ED Discharge Orders     None         Regan Lemming, MD 01/31/22 1501

## 2022-01-31 NOTE — ED Notes (Signed)
Patient verbalizes understanding of discharge instructions. Opportunity for questioning and answers were provided. Armband removed by staff, pt discharged from ED.  

## 2022-01-31 NOTE — Progress Notes (Signed)
Dialysis access has been completed.   Preliminary results in CV Proc.   Paul [name] 01/31/2022 1:47 PM

## 2022-01-31 NOTE — ED Triage Notes (Signed)
Patient has an upper left arm graft with no thrill or bruit.  Has an appt on Thursday for a graftogram but is suppose to have dialysis tomorrow.

## 2022-01-31 NOTE — Discharge Instructions (Signed)
You were seen in the emergency department for concern for a clotted fistula.  Imaging today is concerning for a clotted fistula and we were unable to get you an interventional radiology appointment today to have this declotted.  You are scheduled in 48 hours for this procedure and if you feel any worsening shortness of breath or notice new or worsening swelling, please return to the emergency department immediately.  We believe at this time you are safe for discharge to have this fistula fixed in the outpatient setting but if anything worries you in the next 48 hours please return.

## 2022-01-31 NOTE — Progress Notes (Signed)
IVT consulted for difficult PIV placement.  Upon arrival, RN Shirlee Limerick advised me pt is being d/c'd and PIV is no longer needed.

## 2022-02-01 ENCOUNTER — Other Ambulatory Visit: Payer: Self-pay | Admitting: Radiology

## 2022-02-01 NOTE — ED Provider Notes (Signed)
  Physical Exam  BP 104/62   Pulse 87   Temp (!) 97.5 F (36.4 C) (Oral)   Resp (!) 21   Ht 6\' 2"  (1.88 m)   Wt (!) 150.1 kg   SpO2 99%   BMI 42.50 kg/m   Physical Exam Constitutional:      General: He is not in acute distress.    Appearance: Normal appearance.  HENT:     Head: Normocephalic and atraumatic.     Nose: No congestion or rhinorrhea.  Eyes:     General:        Right eye: No discharge.        Left eye: No discharge.     Extraocular Movements: Extraocular movements intact.     Pupils: Pupils are equal, round, and reactive to light.  Cardiovascular:     Rate and Rhythm: Normal rate and regular rhythm.     Heart sounds: No murmur heard. Pulmonary:     Effort: No respiratory distress.     Breath sounds: No wheezing or rales.  Abdominal:     General: There is no distension.     Tenderness: There is no abdominal tenderness.  Musculoskeletal:        General: Normal range of motion.     Cervical back: Normal range of motion.  Skin:    General: Skin is warm and dry.  Neurological:     General: No focal deficit present.     Mental Status: He is alert.     Procedures  Procedures  ED Course / MDM   Clinical Course as of 02/01/22 1225  Mon Jan 31, 2022  1501 IR for fistula occulsion, dc after [MK]    Clinical Course User Index [MK] Eivan Gallina, Debe Coder, MD   Medical Decision Making Amount and/or Complexity of Data Reviewed Labs: ordered.   Patient received in handoff.  Clotted dialysis fistula with plans for IR declotting procedure today.  At approximately 5:30 PM I checked back in with the interventional radiologist on-call who informed me that the patient's procedure is not an emergency and will need to happen outpatient.  When pressed further as to why the patient had to wait 9 hours for this opinion, we work together to facilitate faster outpatient follow-up for the patient.  I also spoke with the nephrologist on-call Dr. Johnney Ou who offered the patient  outpatient nephrology declotting tomorrow but the patient states that he does not want to work with [place] [facility] providers and would rather have this done with interventional radiology.  At minimum, patient has scheduled follow-up in 48 hours but he will be discharged with the hopes of maybe he can be added on tomorrow in the outpatient radiology office.  He was given strict return precautions including shortness of breath, swelling of which she voiced understanding.       Teressa Lower, MD 02/01/22 1227

## 2022-02-02 ENCOUNTER — Other Ambulatory Visit (HOSPITAL_COMMUNITY): Payer: Self-pay | Admitting: Internal Medicine

## 2022-02-02 ENCOUNTER — Ambulatory Visit (HOSPITAL_COMMUNITY)
Admission: RE | Admit: 2022-02-02 | Discharge: 2022-02-02 | Disposition: A | Discharge status: Home / Self Care | Payer: Medicaid Other | Source: Ambulatory Visit | Attending: Internal Medicine | Admitting: Internal Medicine

## 2022-02-02 ENCOUNTER — Other Ambulatory Visit: Payer: Self-pay

## 2022-02-02 ENCOUNTER — Encounter (HOSPITAL_COMMUNITY): Payer: Self-pay

## 2022-02-02 DIAGNOSIS — N186 End stage renal disease: Secondary | ICD-10-CM

## 2022-02-02 DIAGNOSIS — T82868A Thrombosis of vascular prosthetic devices, implants and grafts, initial encounter: Secondary | ICD-10-CM | POA: Insufficient documentation

## 2022-02-02 DIAGNOSIS — Y841 Kidney dialysis as the cause of abnormal reaction of the patient, or of later complication, without mention of misadventure at the time of the procedure: Secondary | ICD-10-CM | POA: Insufficient documentation

## 2022-02-02 DIAGNOSIS — Z992 Dependence on renal dialysis: Secondary | ICD-10-CM | POA: Diagnosis not present

## 2022-02-02 HISTORY — PX: IR THROMBECTOMY AV FISTULA W/THROMBOLYSIS/PTA INC/SHUNT/IMG LEFT: IMG6106

## 2022-02-02 HISTORY — PX: IR US GUIDE VASC ACCESS LEFT: IMG2389

## 2022-02-02 LAB — GLUCOSE, CAPILLARY
Glucose-Capillary: 123 mg/dL — ABNORMAL HIGH (ref 70–99)
Glucose-Capillary: 88 mg/dL (ref 70–99)

## 2022-02-02 MED ORDER — FENTANYL CITRATE (PF) 100 MCG/2ML IJ SOLN
INTRAMUSCULAR | Status: AC | PRN
  Administered 2022-02-02: 25 ug via INTRAVENOUS
  Administered 2022-02-02: 50 ug via INTRAVENOUS
  Administered 2022-02-02: 25 ug via INTRAVENOUS

## 2022-02-02 MED ORDER — HEPARIN SODIUM (PORCINE) 1000 UNIT/ML IJ SOLN
INTRAMUSCULAR | Status: AC | PRN
  Administered 2022-02-02: 4000 [IU] via INTRAVENOUS

## 2022-02-02 MED ORDER — IOHEXOL 300 MG/ML  SOLN
100.0000 mL | Freq: Once | INTRAMUSCULAR | Status: AC | PRN
  Administered 2022-02-02: 50 mL via INTRAVENOUS

## 2022-02-02 MED ORDER — ALTEPLASE 2 MG IJ SOLR
INTRAMUSCULAR | Status: AC
  Filled 2022-02-02: qty 2

## 2022-02-02 MED ORDER — ALTEPLASE 2 MG IJ SOLR
INTRAMUSCULAR | Status: AC | PRN
  Administered 2022-02-02: 4 mg

## 2022-02-02 MED ORDER — MIDAZOLAM HCL 2 MG/2ML IJ SOLN
INTRAMUSCULAR | Status: AC | PRN
  Administered 2022-02-02 (×2): .5 mg via INTRAVENOUS
  Administered 2022-02-02: 1 mg via INTRAVENOUS

## 2022-02-02 MED ORDER — FENTANYL CITRATE (PF) 100 MCG/2ML IJ SOLN
INTRAMUSCULAR | Status: AC
  Filled 2022-02-02: qty 2

## 2022-02-02 MED ORDER — HEPARIN SODIUM (PORCINE) 1000 UNIT/ML IJ SOLN
INTRAMUSCULAR | Status: AC
  Filled 2022-02-02: qty 10

## 2022-02-02 MED ORDER — SODIUM CHLORIDE 0.9 % IV SOLN: INTRAVENOUS | Status: DC

## 2022-02-02 MED ORDER — LIDOCAINE HCL 1 % IJ SOLN
INTRAMUSCULAR | Status: AC
  Administered 2022-02-02: 10 mL
  Filled 2022-02-02: qty 20

## 2022-02-02 MED ORDER — MIDAZOLAM HCL 2 MG/2ML IJ SOLN
INTRAMUSCULAR | Status: AC
  Filled 2022-02-02: qty 2

## 2022-02-02 NOTE — H&P (Signed)
Chief Complaint: Patient was seen in consultation today for left upper arm dialysis access evaluation/intervention at the request of Peeples,Samuel J  Referring Physician(s): Peeples,Samuel J  Supervising Physician: Sandi Mariscal  Patient Status: [facility] - Out-pt  History of Present Illness: Eryn Krejci is a 31 y.o. male   Dialysis access left upper arm placed 06/2021- Dr Donzetta Matters Using without issue Last use Sat 01/29/22; dialysis copmplete Noted clotted Monday am 01/31/22 Came to [facility] ED Vasc doppler: Summary:  Arteriovenous fistula- Thrombus noted in the distal upper arm to axilla.  Axillary vein appears patent.  Scheduled now for IR evaluation and possible intervention    Past Medical History:  Diagnosis Date   Acute hypoxemic respiratory failure ([place]) 09/11/2016   Anemia    Chronic kidney disease    ARF on CRF Dialysis T/TH/Sa   Diabetes mellitus    Type II   End stage renal disease on dialysis ([place])    [place]    GERD (gastroesophageal reflux disease)    diet controlled   HLD (hyperlipidemia)    Hypertension    Hypothyroidism    Morbid obesity ([place])    Sacral wound    resolved   Thyroid disease     Past Surgical History:  Procedure Laterality Date   AMPUTATION Left 05/08/2021   Procedure: AMPUTATION GREAT TOE;  Surgeon: Newt Minion, MD;  Location: [place];  Service: Orthopedics;  Laterality: Left;   AV FISTULA PLACEMENT Left 09/26/2016   Procedure: LEFT UPPER ARM ARTERIOVENOUS (AV) FISTULA CREATION;  Surgeon: Elam Dutch, MD;  Location: [place];  Service: Vascular;  Laterality: Left;   AV FISTULA PLACEMENT Left 12/02/2016   Procedure: INSERTION OF ARTERIOVENOUS GORE-TEX GRAFT LEFT UPPER  ARM USING A 4-7MM BY 45CM GRAFT ;  Surgeon: Rosetta Posner, MD;  Location: [place];  Service: Vascular;  Laterality: Left;   AV FISTULA PLACEMENT Right 07/03/2020   Procedure: INSERTION OF ARTERIOVENOUS (AV) GORE-TEX GRAFT RIGHT UPPER ARM;  Surgeon: Marty Heck,  MD;  Location: [place];  Service: Vascular;  Laterality: Right;   AV FISTULA PLACEMENT Left 07/02/2021   Procedure: LEFT UPPER EXTREMITY ARTERIOVENOUS FISTULA CREATION;  Surgeon: Waynetta Sandy, MD;  Location: [place];  Service: Vascular;  Laterality: Left;   [place] Right 05/06/2020   Procedure: FIRST STAGE RIGHT [place];  Surgeon: Serafina Mitchell, MD;  Location: [place];  Service: Vascular;  Laterality: Right;   [place] Left 08/20/2021   Procedure: LEFT ARM SECOND STAGE WITH ARTERIOVENOUS GRAFT;  Surgeon: Waynetta Sandy, MD;  Location: [place];  Service: Vascular;  Laterality: Left;   EXCHANGE OF A DIALYSIS CATHETER Right 10/08/2016   Procedure: EXCHANGE OF A DIALYSIS CATHETER-RIGHT INTERNAL JUGULAR;  Surgeon: Conrad Ford Cliff, MD;  Location: [place];  Service: Vascular;  Laterality: Right;   INSERTION OF DIALYSIS CATHETER Right 09/26/2016   Procedure: INSERTION OF DIALYSIS CATHETER - Right Internal Jugular Placement;  Surgeon: Elam Dutch, MD;  Location: [place];  Service: Vascular;  Laterality: Right;   INSERTION OF DIALYSIS CATHETER Left 03/22/2018   Procedure: INSERTION OF DIALYSIS CATHETER;  Surgeon: Marty Heck, MD;  [facility];  Service: Vascular;  Laterality: Left;   IR FLUORO GUIDE CV LINE RIGHT  03/26/2021   IR FLUORO GUIDE CV LINE RIGHT  05/13/2021   IR REMOVAL TUN CV CATH W/O FL  05/11/2021   IR REMOVAL TUN CV CATH W/O FL  10/06/2021  IR US GUIDE VASC ACCESS RIGHT  03/26/2021   IR US GUIDE VASC ACCESS RIGHT  05/13/2021   REVISON OF ARTERIOVENOUS FISTULA Left 03/22/2018   Procedure: LEFT UPPER EXTREMITY ARTERIOVENOUS  REVISION WITH GORE-TEX GRAFT.;  Surgeon: Marty Heck, MD;  Location: [place];  Service: Vascular;  Laterality: Left;   TEE WITHOUT CARDIOVERSION N/A 05/12/2021   Procedure: TRANSESOPHAGEAL ECHOCARDIOGRAM (TEE);  Surgeon: Werner Lean, MD;  Location: [facility] ENDOSCOPY;   Service: Cardiovascular;  Laterality: N/A;   THROMBECTOMY Left 08/26/2019   thrombectomy of LUA loop AVG   THROMBECTOMY AND REVISION OF ARTERIOVENTOUS (AV) GORETEX  GRAFT Right 07/27/2020   Procedure: INSERTION OF RIGHT ARM LOOP GRAFT WITH EXCISION OF RIGHT ARM BRACHIAL AXILLARY GRAFT;  Surgeon: Marty Heck, MD;  Location: [place];  Service: Vascular;  Laterality: Right;   UPPER EXTREMITY VENOGRAPHY Bilateral 06/14/2021   Procedure: UPPER EXTREMITY VENOGRAPHY;  Surgeon: Waynetta Sandy, MD;  Location: [place] CV LAB;  Service: Cardiovascular;  Laterality: Bilateral;   VISCERAL ANGIOGRAPHY Right 06/17/2020   Procedure: CENTRAL VENO;  Surgeon: Cherre Robins, MD;  Location: [place] CV LAB;  Service: Cardiovascular;  Laterality: Right;    Allergies: Hydralazine hcl  Medications: Prior to Admission medications   Medication Sig Start Date End Date Taking? Authorizing Provider  Accu-Chek Softclix Lancets lancets Use as instructed 06/11/21  Yes Eppie Gibson, MD  acetaminophen (TYLENOL) 500 MG tablet Take 1,000 mg by mouth every 6 (six) hours as needed (for pain.).   Yes [provider]  Blood Glucose Monitoring Suppl (ACCU-CHEK GUIDE ME) w/Device KIT 1 kit by Does not apply route daily. 05/22/21  Yes Eppie Gibson, MD  calcium carbonate (TUMS - DOSED IN MG ELEMENTAL CALCIUM) 500 MG chewable tablet Chew 1,000 mg by mouth daily as needed for indigestion or heartburn.   Yes [provider]  cetirizine (ZYRTEC) 10 MG tablet Take 10 mg by mouth daily as needed for allergies.   Yes [provider]  cinacalcet (SENSIPAR) 30 MG tablet Take 30 mg by mouth daily. 08/12/21  Yes [provider]  ELIQUIS 5 MG TABS tablet TAKE 1 TABLET BY MOUTH TWICE DAILY. START TAKING ONLY AFTER STARTER PACK HAS BEEN COMPLETED 01/20/22  Yes Dameron, Luna Fuse, DO  ferric citrate (AURYXIA) 1 GM 210 MG(Fe) tablet Take 420-840 mg by mouth See admin instructions. Take 4  tablets (840 mg) by mouth with each meal & take 2 tablets (420 mg) by mouth with each snack.   Yes [provider]  gabapentin (NEURONTIN) 100 MG capsule Take 1 capsule (100 mg total) by mouth 3 (three) times daily. 09/08/21  Yes Waynetta Sandy, MD  glucose blood (ACCU-CHEK GUIDE) test strip 1 each by Other route 2 (two) times daily. Use as instructed 09/22/21  Yes Dahbura, Anton, DO  LANTUS 100 UNIT/ML injection INJECT 10 TO 15 UNITS INTO THE SKIN TWO TIMES DAILY. PATIENT NEEDS APPOINTMENT Patient taking differently: Inject 5 Units into the skin 2 (two) times daily. 01/31/22  Yes Dameron, Luna Fuse, DO  levothyroxine (SYNTHROID) 150 MCG tablet TAKE 1 TABLET BY MOUTH ONCE DAILY BEFORE BREAKFAST 12/27/21  Yes Maxwell, Allee, MD  midodrine (PROAMATINE) 5 MG tablet Take 1 tablet (5 mg total) by mouth 3 (three) times daily with meals. Patient taking differently: Take 5 mg by mouth Every Tuesday,Thursday,and Saturday with dialysis. 05/14/21  Yes Bonnielee Haff, MD  multivitamin (RENA-VIT) TABS tablet Take 1 tablet by mouth at bedtime. 10/25/16  Yes Phill Myron  Lauren, MD  rosuvastatin (CRESTOR) 20 MG tablet Take 1 tablet by mouth once daily 01/08/22  Yes Dameron, Luna Fuse, DO  Syringe, Disposable, (B-D SYRINGE SLIP TIP 30CC) 30 ML MISC 1 Syringe by Does not apply route daily. 01/31/19  Yes Wilber Oliphant, MD     Family History  Problem Relation Age of Onset   Heart disease Mother    Hypertension Mother     Social History   Socioeconomic History   Marital status: Single    Spouse name: Not on file   Number of children: Not on file   Years of education: Not on file   Highest education level: Not on file  Occupational History   Not on file  Tobacco Use   Smoking status: Never   Smokeless tobacco: Never  Vaping Use   Vaping Use: Never used  Substance and Sexual Activity   Alcohol use: No   Drug use: Yes    Frequency: 2.0 times per week    Types: Marijuana    Comment: Last  use was on 08/17/21   Sexual activity: Yes  Other Topics Concern   Not on file  Social History Narrative   Not on file   Social Determinants of Health   Financial Resource Strain: Not on file  Food Insecurity: No Food Insecurity (05/21/2021)   Hunger Vital Sign    Worried About Running Out of Food in the Last Year: Never true    Ran Out of Food in the Last Year: Never true  Transportation Needs: Unmet Transportation Needs (05/21/2021)   PRAPARE - Transportation    Lack of Transportation (Medical): Yes    Lack of Transportation (Non-Medical): Yes  Physical Activity: Insufficiently Active (07/07/2021)   Exercise Vital Sign    Days of Exercise per Week: 4 days    Minutes of Exercise per Session: 30 min  Stress: Stress Concern Present (08/06/2021)   [place]    Feeling of Stress : To some extent  Social Connections: Socially Isolated (06/07/2021)   Social Connection and Isolation Panel [NHANES]    Frequency of Communication with Friends and Family: More than three times a week    Frequency of Social Gatherings with Friends and Family: More than three times a week    Attends Religious Services: Never    Marine scientist or Organizations: No    Attends Archivist Meetings: Never    Marital Status: Never married     Review of Systems: A 12 point ROS discussed and pertinent positives are indicated in the HPI above.  All other systems are negative.  Review of Systems  Constitutional:  Negative for activity change, fatigue and fever.  Respiratory:  Negative for cough and shortness of breath.   Gastrointestinal:  Negative for abdominal pain.  Neurological:  Negative for weakness.  Psychiatric/Behavioral:  Negative for behavioral problems and confusion.     Vital Signs: BP (!) 120/91   Pulse 80   Temp 97.9 F (36.6 C) (Tympanic)   Resp 18   Ht 6' 2"  (1.88 m)   Wt (!) 330 lb (149.7 kg)   SpO2 100%    BMI 42.37 kg/m     Physical Exam Vitals reviewed.  HENT:     Mouth/Throat:     Mouth: Mucous membranes are moist.  Cardiovascular:     Rate and Rhythm: Normal rate and regular rhythm.     Heart sounds: Normal heart sounds.  Pulmonary:  Effort: Pulmonary effort is normal.     Breath sounds: Normal breath sounds.  Abdominal:     Palpations: Abdomen is soft.  Musculoskeletal:        General: Normal range of motion.     Comments: Left upper arm dialysis fistula clotted No pulse No thrill  Skin:    General: Skin is warm.  Neurological:     Mental Status: He is alert and oriented to person, place, and time.  Psychiatric:        Behavior: Behavior normal.     Imaging: VAS US DUPLEX DIALYSIS ACCESS (AVF, AVG)  Result Date: 01/31/2022 DIALYSIS ACCESS Patient Name:  Paul [name]  Date of Exam:   01/31/2022 Medical Rec #: [id number]      Accession #:    [id number] Date of Birth: Nov 05, 1990       Patient Gender: M Patient Age:   67 years Exam Location:  [place] Procedure:      VAS US DUPLEX DIALYSIS ACCESS (AVF, AVG) Referring Phys: Candiss Norse --------------------------------------------------------------------------------  Reason for Exam: No palpable thrill for AVF/AVG. Access Site: Left Upper Extremity. Access Type: Brachiobasilic AVF. History: Patent graft brachiobasilic 2/53/6644. Comparison Study: 07/28/21 prior Performing Technologist: Archie Patten RVS  Examination Guidelines: A complete evaluation includes B-mode imaging, spectral Doppler, color Doppler, and power Doppler as needed of all accessible portions of each vessel. Unilateral testing is considered an integral part of a complete examination. Limited examinations for reoccurring indications may be performed as noted.  Findings: +--------------------+----------+-----------------+------------------------+ AVF                 PSV (cm/s)Flow Vol (mL/min)        Comments          +--------------------+----------+-----------------+------------------------+ Native artery inflow    74                     triphasic native artery  +--------------------+----------+-----------------+------------------------+ AVF Anastomosis         99                     high resistance waveform +--------------------+----------+-----------------+------------------------+  +-------------+----------+-------------+----------+------------------+ OUTFLOW VEIN PSV (cm/s)Diameter (cm)Depth (cm)     Describe      +-------------+----------+-------------+----------+------------------+ Axillary vein                                       patent       +-------------+----------+-------------+----------+------------------+ Shoulder                                           occluded      +-------------+----------+-------------+----------+------------------+ Prox UA                                            occluded      +-------------+----------+-------------+----------+------------------+ Mid UA                                             occluded      +-------------+----------+-------------+----------+------------------+ Dist UA  preocclusive thump +-------------+----------+-------------+----------+------------------+   Summary: Arteriovenous fistula-Thrombus noted in the distal upper arm to axilla. Axillary vein appears patent. *See table(s) above for measurements and observations.  Diagnosing physician: Monica Martinez MD Electronically signed by Monica Martinez MD on 01/31/2022 at 4:18:00 PM.   --------------------------------------------------------------------------------   Final     Labs:  CBC: Recent Labs    05/12/21 0346 05/14/21 3887 06/14/21 1959 07/02/21 0759 07/24/21 7471 08/20/21 0937 01/31/22 1230  WBC 11.1* 11.5*  --   --  9.2  --  11.6*  HGB 13.8 11.9*   < > 16.3 13.2 18.4* 13.6  HCT 40.1 36.6*   < > 48.0 41.8  54.0* 42.0  PLT 347 346  --   --  193  --  302   < > = values in this interval not displayed.    COAGS: Recent Labs    05/06/21 1600  INR 1.3*  APTT 34    BMP: Recent Labs    05/12/21 0346 05/14/21 0611 06/14/21 0626 07/02/21 0759 07/24/21 0812 08/20/21 0937 01/31/22 1230  NA 136 134*   < > 137 138 137 139  K 3.7 4.3   < > 4.4 4.0 4.7 4.6  CL 97* 99   < > 101 96* 99 97*  CO2 24 21*  --   --  23  --  24  GLUCOSE 111* 114*   < > 125* 101* 114* 99  BUN 46* 45*   < > 46* 54* 47* 61*  CALCIUM 9.7 9.3  --   --  8.4*  --  8.9  CREATININE 12.87* 12.74*   < > 14.60* 16.43* 14.70* 19.13*  GFRNONAA 5* 5*  --   --  4*  --  3*   < > = values in this interval not displayed.    LIVER FUNCTION TESTS: Recent Labs    05/06/21 1600 05/11/21 0824 05/14/21 0611  BILITOT 0.7  --   --   AST 21  --   --   ALT 12  --   --   ALKPHOS 69  --   --   PROT 9.8*  --   --   ALBUMIN 4.3 2.9* 3.3*    TUMOR MARKERS: No results for input(s): "AFPTM", "CEA", "CA199", "CHROMGRNA" in the last 8760 hours.  Assessment and Plan:  Scheduled for upper left arm dialysis fistula evaluation/intervention Possible tunneled dialysis catheter placement Pt is aware of procedure benefits and risks Including but not limited to Infection; bleeding; damage to structure or surrounding structures; blood clot or pulmonary embolus He is agreeable to proceed Consent signed and in chart   Thank you for this interesting consult.  I greatly enjoyed meeting Deakon Frix and look forward to participating in their care.  A copy of this report was sent to the requesting provider on this date.  Electronically Signed: Lavonia Drafts, PA-C 02/02/2022, 10:15 AM   I spent a total of  30 Minutes   in face to face in clinical consultation, greater than 50% of which was counseling/coordinating care for left arm dialysis fistula evaluation/intervention

## 2022-02-02 NOTE — Procedures (Signed)
Pre procedural Dx: ESRD Post procedural Dx: Same.   Technically successful declot of left upper arm dialysis graft.   Access is ready for immediate use.  EBL: Trace  Complications: None immediate   Jay Janda Cargo, MD Pager #: [phone]    

## 2022-02-15 ENCOUNTER — Other Ambulatory Visit: Payer: Self-pay | Admitting: Student

## 2022-02-18 ENCOUNTER — Other Ambulatory Visit: Payer: Self-pay | Admitting: Vascular Surgery

## 2022-02-19 DIAGNOSIS — N186 End stage renal disease: Secondary | ICD-10-CM | POA: Diagnosis not present

## 2022-02-19 DIAGNOSIS — E1129 Type 2 diabetes mellitus with other diabetic kidney complication: Secondary | ICD-10-CM | POA: Diagnosis not present

## 2022-02-19 DIAGNOSIS — Z992 Dependence on renal dialysis: Secondary | ICD-10-CM | POA: Diagnosis not present

## 2022-02-28 ENCOUNTER — Ambulatory Visit (INDEPENDENT_AMBULATORY_CARE_PROVIDER_SITE_OTHER): Payer: Medicaid Other | Admitting: Orthopedic Surgery

## 2022-02-28 DIAGNOSIS — Z89432 Acquired absence of left foot: Secondary | ICD-10-CM | POA: Diagnosis not present

## 2022-02-28 DIAGNOSIS — B351 Tinea unguium: Secondary | ICD-10-CM

## 2022-03-01 ENCOUNTER — Encounter: Payer: Self-pay | Admitting: Orthopedic Surgery

## 2022-03-01 NOTE — Progress Notes (Signed)
Office Visit Note   Patient: Paul [name]           Date of Birth: 1991/02/27           MRN: [id number] Visit Date: 02/28/2022              Requested by: Orvis Brill, DO [place],  [place] [address] PCP: Orvis Brill, DO  Chief Complaint  Patient presents with   Right Foot - Follow-up   Left Foot - Follow-up    Hx left 1st ray amputation 05/08/2021      HPI: Patient is a 31 year old gentleman who is seen in follow-up for both lower extremities.  Patient is status post a left foot first ray amputation December 2022.  Patient complains of painful onychomycotic nails x9 he is concerned of infection.  Assessment & Plan: Visit Diagnoses:  1. 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 Onychomycosis     Plan: Nails were trimmed x9 without complication.  No signs of infection.  Follow-Up Instructions: Return in about 3 months (around 05/31/2022).   Ortho Exam  Patient is alert, oriented, no adenopathy, well-dressed, normal affect, normal respiratory effort. Examination patient has a well-healed left first ray amputation.  There is callus on the plantar aspect of both feet but no open ulcers no cellulitis.  Patient has thickened discolored onychomycotic nails x9 no signs of an ingrown toenail or paronychial infection.  After informed consent the nails were trimmed x9 without complication.  Imaging: No results found. No images are attached to the encounter.  Labs: Lab Results  Component Value Date   HGBA1C 6.2 09/22/2021   HGBA1C 6.0 (H) 05/07/2021   HGBA1C 5.8 03/17/2021   REPTSTATUS 05/13/2021 FINAL 05/08/2021   GRAMSTAIN  05/08/2021    FEW WBC PRESENT, PREDOMINANTLY PMN FEW GRAM POSITIVE COCCI    CULT  05/08/2021    MODERATE STAPHYLOCOCCUS AUREUS NO ANAEROBES ISOLATED Performed at [facility], [place] [address], [place],  [address]    LABORGA STAPHYLOCOCCUS AUREUS 05/08/2021     Lab Results  Component Value Date    ALBUMIN 3.3 (L) 05/14/2021   ALBUMIN 2.9 (L) 05/11/2021   ALBUMIN 4.3 05/06/2021    Lab Results  Component Value Date   MG 2.8 (H) 09/26/2016   MG 2.8 (H) 09/25/2016   MG 2.8 (H) 09/24/2016   Lab Results  Component Value Date   VD25OH 6.8 (L) 09/14/2016    No results found for: "PREALBUMIN"    Latest Ref Rng & Units 01/31/2022   12:30 PM 08/20/2021    9:37 AM 07/24/2021    8:12 AM  CBC EXTENDED  WBC 4.0 - 10.5 K/uL 11.6   9.2   RBC 4.22 - 5.81 MIL/uL 5.01   4.87   Hemoglobin 13.0 - 17.0 g/dL 13.6  18.4  13.2   HCT 39.0 - 52.0 % 42.0  54.0  41.8   Platelets 150 - 400 K/uL 302   193   NEUT# 1.7 - 7.7 K/uL 7.2     Lymph# 0.7 - 4.0 K/uL 3.4        There is no height or weight on file to calculate BMI.  Orders:  No orders of the defined types were placed in this encounter.  No orders of the defined types were placed in this encounter.    Procedures: No procedures performed  Clinical Data: No additional findings.  ROS:  All other systems negative, except as noted in the  HPI. Review of Systems  Objective: Vital Signs: There were no vitals taken for this visit.  Specialty Comments:  No specialty comments available.  PMFS History: Patient Active Problem List   Diagnosis Date Noted   Left hand pain 09/22/2021   Diabetic foot infection ([place])    Wound infection 05/07/2021   MSSA bacteremia 05/07/2021   Osteomyelitis ([place]) 05/07/2021   Abscess 05/07/2021   Sepsis due to cellulitis ([place]) 05/06/2021   Poor compliance with medication 03/17/2021   Fluid overload, unspecified 50/93/2671   Complication of vascular dialysis catheter 03/23/2020   Other disorders of phosphorus metabolism 01/28/2020   Hypertension 01/08/2020   Hypocalcemia 10/29/2019   ESRD on dialysis ([place]) 11/16/2017   Pruritus, unspecified 04/04/2017   Unspecified protein-calorie malnutrition ([place]) 11/11/2016   Headache, unspecified 10/29/2016   Encounter for immunization 10/19/2016   Iron  deficiency anemia, unspecified 10/10/2016   Anemia in chronic kidney disease 09/30/2016   Coagulation defect, unspecified ([place]) 09/30/2016   Pneumonia due to Pseudomonas ([place]) 09/30/2016   Secondary hyperparathyroidism of renal origin ([place]) 09/30/2016   Hypothyroidism 10/29/2009   Type 2 diabetes mellitus ([place]) 04/14/2009   Hyperlipidemia 04/14/2009   OBESITY, MORBID 04/14/2009   Essential hypertension, benign 04/14/2009   Past Medical History:  Diagnosis Date   Acute hypoxemic respiratory failure ([place]) 09/11/2016   Anemia    Chronic kidney disease    ARF on CRF Dialysis T/TH/Sa   Diabetes mellitus    Type II   End stage renal disease on dialysis ([place])    [place]    GERD (gastroesophageal reflux disease)    diet controlled   HLD (hyperlipidemia)    Hypertension    Hypothyroidism    Morbid obesity ([place])    Sacral wound    resolved   Thyroid disease     Family History  Problem Relation Age of Onset   Heart disease Mother    Hypertension Mother     Past Surgical History:  Procedure Laterality Date   AMPUTATION Left 05/08/2021   Procedure: AMPUTATION GREAT TOE;  Surgeon: Newt Minion, MD;  Location: [place];  Service: Orthopedics;  Laterality: Left;   AV FISTULA PLACEMENT Left 09/26/2016   Procedure: LEFT UPPER ARM ARTERIOVENOUS (AV) FISTULA CREATION;  Surgeon: Elam Dutch, MD;  Location: [place];  Service: Vascular;  Laterality: Left;   AV FISTULA PLACEMENT Left 12/02/2016   Procedure: INSERTION OF ARTERIOVENOUS GORE-TEX GRAFT LEFT UPPER  ARM USING A 4-7MM BY 45CM GRAFT ;  Surgeon: Rosetta Posner, MD;  Location: [place];  Service: Vascular;  Laterality: Left;   AV FISTULA PLACEMENT Right 07/03/2020   Procedure: INSERTION OF ARTERIOVENOUS (AV) GORE-TEX GRAFT RIGHT UPPER ARM;  Surgeon: Marty Heck, MD;  Location: [place];  Service: Vascular;  Laterality: Right;   AV FISTULA PLACEMENT Left 07/02/2021   Procedure: LEFT UPPER EXTREMITY ARTERIOVENOUS FISTULA CREATION;   Surgeon: Waynetta Sandy, MD;  Location: [place];  Service: Vascular;  Laterality: Left;   [place] Right 05/06/2020   Procedure: FIRST STAGE RIGHT [place];  Surgeon: Serafina Mitchell, MD;  Location: [place];  Service: Vascular;  Laterality: Right;   [place] Left 08/20/2021   Procedure: LEFT ARM SECOND STAGE WITH ARTERIOVENOUS GRAFT;  Surgeon: Waynetta Sandy, MD;  Location: [place];  Service: Vascular;  Laterality: Left;   EXCHANGE OF A DIALYSIS CATHETER Right 10/08/2016   Procedure: EXCHANGE OF A DIALYSIS CATHETER-RIGHT INTERNAL JUGULAR;  Surgeon: Adele Barthel  L, MD;  Location: [place];  Service: Vascular;  Laterality: Right;   INSERTION OF DIALYSIS CATHETER Right 09/26/2016   Procedure: INSERTION OF DIALYSIS CATHETER - Right Internal Jugular Placement;  Surgeon: Elam Dutch, MD;  Location: [place];  Service: Vascular;  Laterality: Right;   INSERTION OF DIALYSIS CATHETER Left 03/22/2018   Procedure: INSERTION OF DIALYSIS CATHETER;  Surgeon: Marty Heck, MD;  Location: [place];  Service: Vascular;  Laterality: Left;   IR FLUORO GUIDE CV LINE RIGHT  03/26/2021   IR FLUORO GUIDE CV LINE RIGHT  05/13/2021   IR REMOVAL TUN CV CATH W/O FL  05/11/2021   IR REMOVAL TUN CV CATH W/O FL  10/06/2021   IR THROMBECTOMY AV FISTULA W/THROMBOLYSIS/PTA INC/SHUNT/IMG LEFT Left 02/02/2022   IR US GUIDE VASC ACCESS LEFT  02/02/2022   IR US GUIDE VASC ACCESS RIGHT  03/26/2021   IR US GUIDE VASC ACCESS RIGHT  05/13/2021   REVISON OF ARTERIOVENOUS FISTULA Left 03/22/2018   Procedure: LEFT UPPER EXTREMITY ARTERIOVENOUS  REVISION WITH GORE-TEX GRAFT.;  Surgeon: Marty Heck, MD;  Location: [place];  Service: Vascular;  Laterality: Left;   TEE WITHOUT CARDIOVERSION N/A 05/12/2021   Procedure: TRANSESOPHAGEAL ECHOCARDIOGRAM (TEE);  Surgeon: Werner Lean, MD;  Location: [facility] ENDOSCOPY;  Service: Cardiovascular;  Laterality: N/A;    THROMBECTOMY Left 08/26/2019   thrombectomy of LUA loop AVG   THROMBECTOMY AND REVISION OF ARTERIOVENTOUS (AV) GORETEX  GRAFT Right 07/27/2020   Procedure: INSERTION OF RIGHT ARM LOOP GRAFT WITH EXCISION OF RIGHT ARM BRACHIAL AXILLARY GRAFT;  Surgeon: Marty Heck, MD;  Location: [place];  Service: Vascular;  Laterality: Right;   UPPER EXTREMITY VENOGRAPHY Bilateral 06/14/2021   Procedure: UPPER EXTREMITY VENOGRAPHY;  Surgeon: Waynetta Sandy, MD;  Location: [place] CV LAB;  Service: Cardiovascular;  Laterality: Bilateral;   VISCERAL ANGIOGRAPHY Right 06/17/2020   Procedure: CENTRAL VENO;  Surgeon: Cherre Robins, MD;  Location: [place] CV LAB;  Service: Cardiovascular;  Laterality: Right;   Social History   Occupational History   Not on file  Tobacco Use   Smoking status: Never   Smokeless tobacco: Never  Vaping Use   Vaping Use: Never used  Substance and Sexual Activity   Alcohol use: No   Drug use: Yes    Frequency: 2.0 times per week    Types: Marijuana    Comment: Last use was on 08/17/21   Sexual activity: Yes

## 2022-03-11 ENCOUNTER — Other Ambulatory Visit: Payer: Self-pay | Admitting: Student

## 2022-03-11 DIAGNOSIS — Z794 Long term (current) use of insulin: Secondary | ICD-10-CM

## 2022-03-19 ENCOUNTER — Other Ambulatory Visit: Payer: Self-pay | Admitting: Student

## 2022-03-20 ENCOUNTER — Emergency Department (HOSPITAL_COMMUNITY): Admission: EM | Admit: 2022-03-20 | Payer: Medicaid Other | Source: Home / Self Care

## 2022-03-21 ENCOUNTER — Other Ambulatory Visit: Payer: Self-pay | Admitting: Student

## 2022-03-21 ENCOUNTER — Other Ambulatory Visit (HOSPITAL_COMMUNITY): Payer: Self-pay | Admitting: Internal Medicine

## 2022-03-21 DIAGNOSIS — N186 End stage renal disease: Secondary | ICD-10-CM

## 2022-03-22 ENCOUNTER — Other Ambulatory Visit (HOSPITAL_COMMUNITY): Payer: Self-pay | Admitting: Internal Medicine

## 2022-03-22 ENCOUNTER — Other Ambulatory Visit: Payer: Self-pay

## 2022-03-22 ENCOUNTER — Encounter (HOSPITAL_COMMUNITY): Payer: Self-pay

## 2022-03-22 ENCOUNTER — Ambulatory Visit (HOSPITAL_COMMUNITY)
Admission: RE | Admit: 2022-03-22 | Discharge: 2022-03-22 | Disposition: A | Discharge status: Home / Self Care | Payer: Medicaid Other | Source: Ambulatory Visit | Attending: Internal Medicine | Admitting: Internal Medicine

## 2022-03-22 DIAGNOSIS — T82858A Stenosis of vascular prosthetic devices, implants and grafts, initial encounter: Secondary | ICD-10-CM | POA: Diagnosis not present

## 2022-03-22 DIAGNOSIS — N186 End stage renal disease: Secondary | ICD-10-CM | POA: Insufficient documentation

## 2022-03-22 DIAGNOSIS — Y841 Kidney dialysis as the cause of abnormal reaction of the patient, or of later complication, without mention of misadventure at the time of the procedure: Secondary | ICD-10-CM | POA: Insufficient documentation

## 2022-03-22 DIAGNOSIS — Z992 Dependence on renal dialysis: Secondary | ICD-10-CM | POA: Diagnosis not present

## 2022-03-22 DIAGNOSIS — T82868A Thrombosis of vascular prosthetic devices, implants and grafts, initial encounter: Secondary | ICD-10-CM | POA: Insufficient documentation

## 2022-03-22 DIAGNOSIS — E1129 Type 2 diabetes mellitus with other diabetic kidney complication: Secondary | ICD-10-CM | POA: Diagnosis not present

## 2022-03-22 HISTORY — PX: IR THROMBECTOMY AV FISTULA W/THROMBOLYSIS/PTA INC/SHUNT/IMG LEFT: IMG6106

## 2022-03-22 HISTORY — PX: IR US GUIDE VASC ACCESS LEFT: IMG2389

## 2022-03-22 LAB — BASIC METABOLIC PANEL
Anion gap: 17 — ABNORMAL HIGH (ref 5–15)
BUN: 51 mg/dL — ABNORMAL HIGH (ref 6–20)
CO2: 23 mmol/L (ref 22–32)
Calcium: 8.3 mg/dL — ABNORMAL LOW (ref 8.9–10.3)
Chloride: 99 mmol/L (ref 98–111)
Creatinine, Ser: 19.37 mg/dL — ABNORMAL HIGH (ref 0.61–1.24)
GFR, Estimated: 3 mL/min — ABNORMAL LOW (ref >60)
Glucose, Bld: 121 mg/dL — ABNORMAL HIGH (ref 70–99)
Potassium: 4.3 mmol/L (ref 3.5–5.1)
Sodium: 139 mmol/L (ref 135–145)

## 2022-03-22 LAB — GLUCOSE, CAPILLARY: Glucose-Capillary: 106 mg/dL — ABNORMAL HIGH (ref 70–99)

## 2022-03-22 MED ORDER — FENTANYL CITRATE (PF) 100 MCG/2ML IJ SOLN
INTRAMUSCULAR | Status: AC | PRN
  Administered 2022-03-22 (×6): 25 ug via INTRAVENOUS

## 2022-03-22 MED ORDER — ALTEPLASE 2 MG IJ SOLR
INTRAMUSCULAR | Status: AC
  Administered 2022-03-22: 1 mg via INTRAVENOUS
  Filled 2022-03-22: qty 4

## 2022-03-22 MED ORDER — HEPARIN SODIUM (PORCINE) 1000 UNIT/ML IJ SOLN
INTRAMUSCULAR | Status: AC
  Filled 2022-03-22: qty 10

## 2022-03-22 MED ORDER — SODIUM CHLORIDE 0.9 % IV SOLN: INTRAVENOUS | Status: DC

## 2022-03-22 MED ORDER — IOHEXOL 300 MG/ML  SOLN
100.0000 mL | Freq: Once | INTRAMUSCULAR | Status: AC | PRN
  Administered 2022-03-22: 65 mL via INTRAVENOUS

## 2022-03-22 MED ORDER — MIDAZOLAM HCL 2 MG/2ML IJ SOLN
INTRAMUSCULAR | Status: AC
  Filled 2022-03-22: qty 2

## 2022-03-22 MED ORDER — FENTANYL CITRATE (PF) 100 MCG/2ML IJ SOLN
INTRAMUSCULAR | Status: AC
  Filled 2022-03-22: qty 2

## 2022-03-22 MED ORDER — HEPARIN SODIUM (PORCINE) 1000 UNIT/ML IJ SOLN
INTRAMUSCULAR | Status: AC | PRN
  Administered 2022-03-22: 1000 [IU] via INTRAVENOUS
  Administered 2022-03-22: 3000 [IU] via INTRAVENOUS

## 2022-03-22 MED ORDER — MIDAZOLAM HCL 2 MG/2ML IJ SOLN
INTRAMUSCULAR | Status: AC | PRN
  Administered 2022-03-22 (×2): 1 mg via INTRAVENOUS

## 2022-03-22 MED ORDER — LIDOCAINE HCL 1 % IJ SOLN
INTRAMUSCULAR | Status: AC
  Filled 2022-03-22: qty 20

## 2022-03-22 NOTE — H&P (Signed)
Chief Complaint: Patient was seen in consultation today for left upper arm dialysis fistula evaluation/intervention at the request of Peeples,Samuel J  Referring Physician(s): Peeples,Samuel J  Supervising Physician: Aletta Edouard  Patient Status: [facility] - Out-pt  History of Present Illness: Paul [name] is a 31 y.o. male   Known to IR Just intervened on same fistula 02/02/22  IMPRESSION: 1. Technically successful left upper arm AV graft thrombolysis. 2. Successful angioplasty of the venous anastomosis and majority of the venous limb to 7 mm diameter. Completion shuntogram demonstrates the venous limb and anastomosis are widely patent. 3. Successful angioplasty of focal moderate narrowing of the arterial limb to 7 mm diameter angioplasty of tandem areas of hemodynamically significant narrowing of the arterial anastomosis and perianastomotic segment of the arterial limb to 4 mm diameter. Completion shuntogram images demonstrate the arterial limb and anastomosis are widely patent. 4. The central venous system is widely patent.   ACCESS: This graft would be amenable to future percutaneous intervention as clinically indicated.  Pt states fistula did well til 2 days ago Clotted and has not used since Scheduled now for evaluation and possible intervention  Past Medical History:  Diagnosis Date   Acute hypoxemic respiratory failure ([place]) 09/11/2016   Anemia    Chronic kidney disease    ARF on CRF Dialysis T/TH/Sa   Diabetes mellitus    Type II   End stage renal disease on dialysis ([place])    [place]    GERD (gastroesophageal reflux disease)    diet controlled   HLD (hyperlipidemia)    Hypertension    Hypothyroidism    Morbid obesity ([place])    Sacral wound    resolved   Thyroid disease     Past Surgical History:  Procedure Laterality Date   AMPUTATION Left 05/08/2021   Procedure: AMPUTATION GREAT TOE;  Surgeon: Newt Minion, MD;  Location: [place];   Service: Orthopedics;  Laterality: Left;   AV FISTULA PLACEMENT Left 09/26/2016   Procedure: LEFT UPPER ARM ARTERIOVENOUS (AV) FISTULA CREATION;  Surgeon: Elam Dutch, MD;  Location: [place];  Service: Vascular;  Laterality: Left;   AV FISTULA PLACEMENT Left 12/02/2016   Procedure: INSERTION OF ARTERIOVENOUS GORE-TEX GRAFT LEFT UPPER  ARM USING A 4-7MM BY 45CM GRAFT ;  Surgeon: Rosetta Posner, MD;  Location: [place];  Service: Vascular;  Laterality: Left;   AV FISTULA PLACEMENT Right 07/03/2020   Procedure: INSERTION OF ARTERIOVENOUS (AV) GORE-TEX GRAFT RIGHT UPPER ARM;  Surgeon: Marty Heck, MD;  Location: [place];  Service: Vascular;  Laterality: Right;   AV FISTULA PLACEMENT Left 07/02/2021   Procedure: LEFT UPPER EXTREMITY ARTERIOVENOUS FISTULA CREATION;  Surgeon: Waynetta Sandy, MD;  Location: [place];  Service: Vascular;  Laterality: Left;   [place] Right 05/06/2020   Procedure: FIRST STAGE RIGHT [place];  Surgeon: Serafina Mitchell, MD;  Location: [place];  Service: Vascular;  Laterality: Right;   [place] Left 08/20/2021   Procedure: LEFT ARM SECOND STAGE WITH ARTERIOVENOUS GRAFT;  Surgeon: Waynetta Sandy, MD;  Location: [place];  Service: Vascular;  Laterality: Left;   EXCHANGE OF A DIALYSIS CATHETER Right 10/08/2016   Procedure: EXCHANGE OF A DIALYSIS CATHETER-RIGHT INTERNAL JUGULAR;  Surgeon: Conrad [place], MD;  Location: [place];  Service: Vascular;  Laterality: Right;   INSERTION OF DIALYSIS CATHETER Right 09/26/2016   Procedure: INSERTION OF DIALYSIS CATHETER - Right Internal Jugular Placement;  Surgeon: Elam Dutch,  MD;  Location: [place];  Service: Vascular;  Laterality: Right;   INSERTION OF DIALYSIS CATHETER Left 03/22/2018   Procedure: INSERTION OF DIALYSIS CATHETER;  Surgeon: Marty Heck, MD;  Location: [place];  Service: Vascular;  Laterality: Left;   IR FLUORO GUIDE CV LINE RIGHT  03/26/2021    IR FLUORO GUIDE CV LINE RIGHT  05/13/2021   IR REMOVAL TUN CV CATH W/O FL  05/11/2021   IR REMOVAL TUN CV CATH W/O FL  10/06/2021   IR THROMBECTOMY AV FISTULA W/THROMBOLYSIS/PTA INC/SHUNT/IMG LEFT Left 02/02/2022   IR US GUIDE VASC ACCESS LEFT  02/02/2022   IR US GUIDE VASC ACCESS RIGHT  03/26/2021   IR US GUIDE VASC ACCESS RIGHT  05/13/2021   REVISON OF ARTERIOVENOUS FISTULA Left 03/22/2018   Procedure: LEFT UPPER EXTREMITY ARTERIOVENOUS  REVISION WITH GORE-TEX GRAFT.;  Surgeon: Marty Heck, MD;  Location: [place];  Service: Vascular;  Laterality: Left;   TEE WITHOUT CARDIOVERSION N/A 05/12/2021   Procedure: TRANSESOPHAGEAL ECHOCARDIOGRAM (TEE);  Surgeon: Werner Lean, MD;  Location: [facility] ENDOSCOPY;  Service: Cardiovascular;  Laterality: N/A;   THROMBECTOMY Left 08/26/2019   thrombectomy of LUA loop AVG   THROMBECTOMY AND REVISION OF ARTERIOVENTOUS (AV) GORETEX  GRAFT Right 07/27/2020   Procedure: INSERTION OF RIGHT ARM LOOP GRAFT WITH EXCISION OF RIGHT ARM BRACHIAL AXILLARY GRAFT;  Surgeon: Marty Heck, MD;  Location: [place];  Service: Vascular;  Laterality: Right;   UPPER EXTREMITY VENOGRAPHY Bilateral 06/14/2021   Procedure: UPPER EXTREMITY VENOGRAPHY;  Surgeon: Waynetta Sandy, MD;  Location: [place] CV [facility];  Service: Cardiovascular;  Laterality: Bilateral;   VISCERAL ANGIOGRAPHY Right 06/17/2020   Procedure: CENTRAL VENO;  Surgeon: Cherre Robins, MD;  Location: [place] [facility];  Service: Cardiovascular;  Laterality: Right;    Allergies: Hydralazine hcl  Medications: Prior to Admission medications   Medication Sig Start Date End Date Taking? Authorizing Provider  acetaminophen (TYLENOL) 500 MG tablet Take 1,000 mg by mouth every 6 (six) hours as needed (for pain.).   Yes [provider]  calcium carbonate (TUMS - DOSED IN MG ELEMENTAL CALCIUM) 500 MG chewable tablet Chew 1,000 mg by mouth daily as needed for indigestion or heartburn.   Yes  [provider]  cetirizine (ZYRTEC) 10 MG tablet Take 10 mg by mouth daily as needed for allergies.   Yes [provider]  cinacalcet (SENSIPAR) 30 MG tablet Take 30 mg by mouth daily. 08/12/21  Yes [provider]  ELIQUIS 5 MG TABS tablet Take 1 tablet by mouth twice daily 03/21/22  Yes Dameron, Luna Fuse, DO  ferric citrate (AURYXIA) 1 GM 210 MG(Fe) tablet Take 420-840 mg by mouth See admin instructions. Take 4 tablets (840 mg) by mouth with each meal & take 2 tablets (420 mg) by mouth with each snack.   Yes [provider]  gabapentin (NEURONTIN) 100 MG capsule TAKE 1 CAPSULE BY MOUTH THREE TIMES DAILY 02/18/22  Yes Waynetta Sandy, MD  LANTUS 100 UNIT/ML injection Inject 0.05 mLs (5 Units total) into the skin 2 (two) times daily. 03/11/22  Yes Dameron, Luna Fuse, DO  levothyroxine (SYNTHROID) 150 MCG tablet TAKE 1 TABLET BY MOUTH ONCE DAILY BEFORE BREAKFAST 12/27/21  Yes Maxwell, Allee, MD  midodrine (PROAMATINE) 5 MG tablet Take 1 tablet (5 mg total) by mouth 3 (three) times daily with meals. Patient taking differently: Take 5 mg by mouth Every Tuesday,Thursday,and Saturday with dialysis. 05/14/21  Yes Bonnielee Haff, MD  multivitamin (  RENA-VIT) TABS tablet Take 1 tablet by mouth at bedtime. 10/25/16  Yes Nicolette Bang, MD  rosuvastatin (CRESTOR) 20 MG tablet Take 1 tablet by mouth once daily 01/08/22  Yes Dameron, Luna Fuse, DO  Accu-Chek Softclix Lancets lancets Use as instructed 06/11/21   Eppie Gibson, MD  Blood Glucose Monitoring Suppl (ACCU-CHEK GUIDE ME) w/Device KIT 1 kit by Does not apply route daily. 05/22/21   Eppie Gibson, MD  glucose blood (ACCU-CHEK GUIDE) test strip 1 each by Other route 2 (two) times daily. Use as instructed 09/22/21   Wells Guiles, DO  Syringe, Disposable, (B-D SYRINGE SLIP TIP 30CC) 30 ML MISC 1 Syringe by Does not apply route daily. 01/31/19   Wilber Oliphant, MD     Family History  Problem Relation Age of  Onset   Heart disease Mother    Hypertension Mother     Social History   Socioeconomic History   Marital status: Single    Spouse name: Not on file   Number of children: Not on file   Years of education: Not on file   Highest education level: Not on file  Occupational History   Not on file  Tobacco Use   Smoking status: Never   Smokeless tobacco: Never  Vaping Use   Vaping Use: Never used  Substance and Sexual Activity   Alcohol use: No   Drug use: Yes    Frequency: 2.0 times per week    Types: Marijuana    Comment: Last use was on 08/17/21   Sexual activity: Yes  Other Topics Concern   Not on file  Social History Narrative   Not on file   Social Determinants of Health   Financial Resource Strain: Not on file  Food Insecurity: No Food Insecurity (05/21/2021)   Hunger Vital Sign    Worried About Running Out of Food in the Last Year: Never true    Ran Out of Food in the Last Year: Never true  Transportation Needs: Unmet Transportation Needs (05/21/2021)   PRAPARE - Transportation    Lack of Transportation (Medical): Yes    Lack of Transportation (Non-Medical): Yes  Physical Activity: Insufficiently Active (07/07/2021)   Exercise Vital Sign    Days of Exercise per Week: 4 days    Minutes of Exercise per Session: 30 min  Stress: Stress Concern Present (08/06/2021)   [place]    Feeling of Stress : To some extent  Social Connections: Socially Isolated (06/07/2021)   Social Connection and Isolation Panel [NHANES]    Frequency of Communication with Friends and Family: More than three times a week    Frequency of Social Gatherings with Friends and Family: More than three times a week    Attends Religious Services: Never    Marine scientist or Organizations: No    Attends Archivist Meetings: Never    Marital Status: Never married    Review of Systems: A 12 point ROS discussed and  pertinent positives are indicated in the HPI above.  All other systems are negative.  Review of Systems  Constitutional:  Negative for activity change, fatigue and fever.  Respiratory:  Negative for cough and shortness of breath.   Cardiovascular:  Negative for chest pain.  Gastrointestinal:  Negative for abdominal pain.  Neurological:  Negative for weakness.  Psychiatric/Behavioral:  Negative for behavioral problems and confusion.     Vital Signs: BP 112/75   Pulse 82  Temp (!) 97.5 F (36.4 C)   Resp 18   Ht _0  (1.88 m)   Wt (!) 325 lb (147.4 kg)   SpO2 100%   BMI 41.73 kg/m     Physical Exam Vitals reviewed.  HENT:     Mouth/Throat:     Mouth: Mucous membranes are moist.  Cardiovascular:     Rate and Rhythm: Normal rate and regular rhythm.     Heart sounds: Normal heart sounds.  Pulmonary:     Effort: Pulmonary effort is normal.     Breath sounds: Normal breath sounds.  Abdominal:     Palpations: Abdomen is soft.     Tenderness: There is no abdominal tenderness.  Musculoskeletal:        General: Normal range of motion.     Comments: Left upper arm fistula: no pulse/no thrill  Skin:    General: Skin is warm.  Neurological:     Mental Status: He is alert and oriented to person, place, and time.  Psychiatric:        Behavior: Behavior normal.     Imaging: No results found.  Labs:  CBC: Recent Labs    05/12/21 0346 05/14/21 0611 06/14/21 0626 07/02/21 0759 07/24/21 0812 08/20/21 0937 01/31/22 1230  WBC 11.1* 11.5*  --   --  9.2  --  11.6*  HGB 13.8 11.9*   < > 16.3 13.2 18.4* 13.6  HCT 40.1 36.6*   < > 48.0 41.8 54.0* 42.0  PLT 347 346  --   --  193  --  302   < > = values in this interval not displayed.    COAGS: Recent Labs    05/06/21 1600  INR 1.3*  APTT 34    BMP: Recent Labs    05/14/21 0611 06/14/21 0626 07/24/21 0812 08/20/21 0937 01/31/22 1230 03/22/22 0744  NA 134*   < > 138 137 139 139  K 4.3   < > 4.0 4.7 4.6  4.3  CL 99   < > 96* 99 97* 99  CO2 21*  --  23  --  24 23  GLUCOSE 114*   < > 101* 114* 99 121*  BUN 45*   < > 54* 47* 61* 51*  CALCIUM 9.3  --  8.4*  --  8.9 8.3*  CREATININE 12.74*   < > 16.43* 14.70* 19.13* 19.37*  GFRNONAA 5*  --  4*  --  3* 3*   < > = values in this interval not displayed.    LIVER FUNCTION TESTS: Recent Labs    05/06/21 1600 05/11/21 0824 05/14/21 0611  BILITOT 0.7  --   --   AST 21  --   --   ALT 12  --   --   ALKPHOS 69  --   --   PROT 9.8*  --   --   ALBUMIN 4.3 2.9* 3.3*    TUMOR MARKERS: No results for input(s): "AFPTM", "CEA", "CA199", "CHROMGRNA" in the last 8760 hours.  Assessment and Plan:  Scheduled for upper left arm dialysis fistula evaluation/intervention Possible tunneled dialysis catheter placement Pt is aware of procedure benefits and risks Including but not limited to Infection; bleeding; damage to structure or surrounding structures; blood clot or pulmonary embolus He is agreeable to proceed Consent signed and in chart  Thank you for this interesting consult.  I greatly enjoyed meeting Paul [name] and look forward to participating in their care.  A copy of this  report was sent to the requesting provider on this date.  Electronically Signed: Lavonia Drafts, PA-C 03/22/2022, 8:39 AM   I spent a total of    25 Minutes in face to face in clinical consultation, greater than 50% of which was counseling/coordinating care for left arm dialysis fistula evaluation/intervention

## 2022-03-22 NOTE — Sedation Documentation (Signed)
Intra procedure TPA administered per MD

## 2022-03-22 NOTE — Sedation Documentation (Signed)
Dr Kathlene Cote states that no abx is necessary for procedure with possible stent placement

## 2022-03-22 NOTE — Progress Notes (Signed)
Patient was given discharge instructions. He verbalized understanding. 

## 2022-03-22 NOTE — Procedures (Signed)
Interventional Radiology Procedure Note  Procedure: Dialysis graft declot procedure with angioplasty  Complications: None  Estimated Blood Loss: 25 mL  Findings: Left upper arm dialysis access declot with combination of balloon angioplasty of venous anastomosis, Angiojet thrombectomy, Fogarty thrombectomy and Cleaner thrombectomy.  Combo fistula with interposition graft widely patent on completion.  Venetia Night. Kathlene Cote, M.D Pager:  [phone]

## 2022-04-11 ENCOUNTER — Other Ambulatory Visit: Payer: Self-pay | Admitting: Vascular Surgery

## 2022-04-12 ENCOUNTER — Other Ambulatory Visit (HOSPITAL_COMMUNITY): Payer: Self-pay | Admitting: Internal Medicine

## 2022-04-12 DIAGNOSIS — Z992 Dependence on renal dialysis: Secondary | ICD-10-CM

## 2022-04-12 DIAGNOSIS — N186 End stage renal disease: Secondary | ICD-10-CM

## 2022-04-13 ENCOUNTER — Other Ambulatory Visit: Payer: Self-pay | Admitting: Student

## 2022-04-13 ENCOUNTER — Other Ambulatory Visit: Payer: Self-pay | Admitting: Radiology

## 2022-04-14 ENCOUNTER — Other Ambulatory Visit: Payer: Self-pay | Admitting: Student

## 2022-04-15 ENCOUNTER — Other Ambulatory Visit: Payer: Self-pay

## 2022-04-15 ENCOUNTER — Ambulatory Visit (HOSPITAL_COMMUNITY)
Admission: RE | Admit: 2022-04-15 | Discharge: 2022-04-15 | Disposition: A | Discharge status: Home / Self Care | Payer: Medicaid Other | Source: Ambulatory Visit | Attending: Internal Medicine | Admitting: Internal Medicine

## 2022-04-15 ENCOUNTER — Other Ambulatory Visit (HOSPITAL_COMMUNITY): Payer: Self-pay | Admitting: Internal Medicine

## 2022-04-15 ENCOUNTER — Other Ambulatory Visit: Payer: Self-pay | Admitting: Student

## 2022-04-15 DIAGNOSIS — N186 End stage renal disease: Secondary | ICD-10-CM

## 2022-04-15 DIAGNOSIS — I120 Hypertensive chronic kidney disease with stage 5 chronic kidney disease or end stage renal disease: Secondary | ICD-10-CM | POA: Insufficient documentation

## 2022-04-15 DIAGNOSIS — T82898A Other specified complication of vascular prosthetic devices, implants and grafts, initial encounter: Secondary | ICD-10-CM | POA: Insufficient documentation

## 2022-04-15 DIAGNOSIS — Y841 Kidney dialysis as the cause of abnormal reaction of the patient, or of later complication, without mention of misadventure at the time of the procedure: Secondary | ICD-10-CM | POA: Diagnosis not present

## 2022-04-15 DIAGNOSIS — E039 Hypothyroidism, unspecified: Secondary | ICD-10-CM

## 2022-04-15 DIAGNOSIS — Z992 Dependence on renal dialysis: Secondary | ICD-10-CM | POA: Insufficient documentation

## 2022-04-15 DIAGNOSIS — T82868A Thrombosis of vascular prosthetic devices, implants and grafts, initial encounter: Secondary | ICD-10-CM | POA: Diagnosis not present

## 2022-04-15 DIAGNOSIS — E1122 Type 2 diabetes mellitus with diabetic chronic kidney disease: Secondary | ICD-10-CM | POA: Insufficient documentation

## 2022-04-15 HISTORY — PX: IR DIALY SHUNT INTRO NEEDLE/INTRACATH INITIAL W/IMG LEFT: IMG6102

## 2022-04-15 HISTORY — PX: IR US GUIDE VASC ACCESS LEFT: IMG2389

## 2022-04-15 LAB — GLUCOSE, CAPILLARY: Glucose-Capillary: 120 mg/dL — ABNORMAL HIGH (ref 70–99)

## 2022-04-15 MED ORDER — MIDAZOLAM HCL 2 MG/2ML IJ SOLN
INTRAMUSCULAR | Status: AC
  Filled 2022-04-15: qty 2

## 2022-04-15 MED ORDER — SODIUM CHLORIDE 0.9 % IV BOLUS
INTRAVENOUS | Status: AC | PRN
  Administered 2022-04-15: 250 mL via INTRAVENOUS

## 2022-04-15 MED ORDER — MIDAZOLAM HCL 2 MG/2ML IJ SOLN
INTRAMUSCULAR | Status: AC | PRN
  Administered 2022-04-15: .5 mg via INTRAVENOUS

## 2022-04-15 MED ORDER — SODIUM CHLORIDE 0.9 % IV SOLN: INTRAVENOUS | Status: DC

## 2022-04-15 MED ORDER — HEPARIN SODIUM (PORCINE) 1000 UNIT/ML IJ SOLN
INTRAMUSCULAR | Status: AC | PRN
  Administered 2022-04-15: 3000 [IU] via INTRAVENOUS

## 2022-04-15 MED ORDER — IOHEXOL 300 MG/ML  SOLN
100.0000 mL | Freq: Once | INTRAMUSCULAR | Status: AC | PRN
  Administered 2022-04-15: 50 mL via INTRAVENOUS

## 2022-04-15 MED ORDER — LIDOCAINE HCL 1 % IJ SOLN
INTRAMUSCULAR | Status: AC
  Filled 2022-04-15: qty 20

## 2022-04-15 MED ORDER — FENTANYL CITRATE (PF) 100 MCG/2ML IJ SOLN
INTRAMUSCULAR | Status: AC | PRN
  Administered 2022-04-15: 25 ug via INTRAVENOUS

## 2022-04-15 MED ORDER — FENTANYL CITRATE (PF) 100 MCG/2ML IJ SOLN
INTRAMUSCULAR | Status: AC
  Filled 2022-04-15: qty 2

## 2022-04-15 MED ORDER — HEPARIN SODIUM (PORCINE) 1000 UNIT/ML IJ SOLN
INTRAMUSCULAR | Status: AC
  Filled 2022-04-15: qty 10

## 2022-04-15 NOTE — H&P (Signed)
 Chief Complaint: Patient was seen in consultation today for left upper arm dialysis fistula evaluation/intervention at the request of Peeples,Samuel J  Referring Physician(s): Peeples,Samuel J  Supervising Physician: Dr. Henn  Patient Status: [facility] - Out-pt  History of Present Illness: Eder Dobie is a 31 y.o. male   Known to IR Just intervened on same fistula 02/02/22 and 1-/31  IMPRESSION: 1. Technically successful left upper arm AV graft thrombolysis. 2. Successful angioplasty of the venous anastomosis and majority of the venous limb to 7 mm diameter. Completion shuntogram demonstrates the venous limb and anastomosis are widely patent. 3. Successful angioplasty of focal moderate narrowing of the arterial limb to 7 mm diameter angioplasty of tandem areas of hemodynamically significant narrowing of the arterial anastomosis and perianastomotic segment of the arterial limb to 4 mm diameter. Completion shuntogram images demonstrate the arterial limb and anastomosis are widely patent. 4. The central venous system is widely patent.   ACCESS: This graft would be amenable to future percutaneous intervention as clinically indicated.  Pt states fistula did well til 2 days ago Pulling clots and poor access pressures Scheduled now for evaluation and possible intervention  Past Medical History:  Diagnosis Date   Acute hypoxemic respiratory failure (HCC) 09/11/2016   Anemia    Chronic kidney disease    ARF on CRF Dialysis T/TH/Sa   Diabetes mellitus    Type II   End stage renal disease on dialysis (HCC)    [place]     GERD (gastroesophageal reflux disease)    diet controlled   HLD (hyperlipidemia)    Hypertension    Hypothyroidism    Morbid obesity (HCC)    Sacral wound    resolved   Thyroid disease     Past Surgical History:  Procedure Laterality Date   AMPUTATION Left 05/08/2021   Procedure: AMPUTATION GREAT TOE;  Surgeon: Duda, Turhan V, MD;  Location:  [facility];  Service: Orthopedics;  Laterality: Left;   AV FISTULA PLACEMENT Left 09/26/2016   Procedure: LEFT UPPER ARM ARTERIOVENOUS (AV) FISTULA CREATION;  Surgeon: Fields, Charles E, MD;  Location: [facility];  Service: Vascular;  Laterality: Left;   AV FISTULA PLACEMENT Left 12/02/2016   Procedure: INSERTION OF ARTERIOVENOUS GORE-TEX GRAFT LEFT UPPER  ARM USING A 4-7MM BY 45CM GRAFT ;  Surgeon: Early, Todd F, MD;  Location: [facility];  Service: Vascular;  Laterality: Left;   AV FISTULA PLACEMENT Right 07/03/2020   Procedure: INSERTION OF ARTERIOVENOUS (AV) GORE-TEX GRAFT RIGHT UPPER ARM;  Surgeon: Clark, Christopher J, MD;  Location: [facility];  Service: Vascular;  Laterality: Right;   AV FISTULA PLACEMENT Left 07/02/2021   Procedure: LEFT UPPER EXTREMITY ARTERIOVENOUS FISTULA CREATION;  Surgeon: Cain, Brandon Christopher, MD;  [facility]: [facility];  Service: Vascular;  Laterality: Left;   BASCILIC VEIN TRANSPOSITION Right 05/06/2020   Procedure: FIRST STAGE RIGHT BASCILIC VEIN TRANSPOSITION;  Surgeon: Brabham, Vance W, MD;  Location: [facility];  Service: Vascular;  Laterality: Right;   BASCILIC VEIN TRANSPOSITION Left 08/20/2021   Procedure: LEFT ARM SECOND STAGE WITH ARTERIOVENOUS GRAFT;  Surgeon: Cain, Brandon Christopher, MD;  [facility];  Service: Vascular;  Laterality: Left;   EXCHANGE OF A DIALYSIS CATHETER Right 10/08/2016   Procedure: EXCHANGE OF A DIALYSIS CATHETER-RIGHT INTERNAL JUGULAR;  Surgeon: Chen, Brian L, MD;  [facility];  Service: Vascular;  Laterality: Right;   INSERTION OF DIALYSIS CATHETER Right 09/26/2016   Procedure: INSERTION OF DIALYSIS CATHETER - Right Internal Jugular Placement;  Surgeon: Fields,   Jessy Oto, MD;  Location: [facility] OR;  Service: Vascular;  Laterality: Right;   INSERTION OF DIALYSIS CATHETER Left 03/22/2018   Procedure: INSERTION OF DIALYSIS CATHETER;  Surgeon: Marty Heck, MD;  Location: [place];  Service: Vascular;  Laterality: Left;   IR FLUORO GUIDE CV LINE RIGHT   03/26/2021   IR FLUORO GUIDE CV LINE RIGHT  05/13/2021   IR REMOVAL TUN CV CATH W/O FL  05/11/2021   IR REMOVAL TUN CV CATH W/O FL  10/06/2021   IR THROMBECTOMY AV FISTULA W/THROMBOLYSIS/PTA INC/SHUNT/IMG LEFT Left 02/02/2022   IR THROMBECTOMY AV FISTULA W/THROMBOLYSIS/PTA INC/SHUNT/IMG LEFT Left 03/22/2022   IR US GUIDE VASC ACCESS LEFT  02/02/2022   IR US GUIDE VASC ACCESS LEFT  03/22/2022   IR US GUIDE VASC ACCESS RIGHT  03/26/2021   IR US GUIDE VASC ACCESS RIGHT  05/13/2021   REVISON OF ARTERIOVENOUS FISTULA Left 03/22/2018   Procedure: LEFT UPPER EXTREMITY ARTERIOVENOUS  REVISION WITH GORE-TEX GRAFT.;  Surgeon: Marty Heck, MD;  Location: [place];  Service: Vascular;  Laterality: Left;   TEE WITHOUT CARDIOVERSION N/A 05/12/2021   Procedure: TRANSESOPHAGEAL ECHOCARDIOGRAM (TEE);  Surgeon: Werner Lean, MD;  Location: [facility] END[facility];  Service: Cardiovascular;  Laterality: N/A;   THROMBECTOMY Left 08/26/2019   thrombectomy of LUA loop AVG   THROMBECTOMY AND REVISION OF ARTERIOVENTOUS (AV) GORETEX  GRAFT Right 07/27/2020   Procedure: INSERTION OF RIGHT ARM LOOP GRAFT WITH EXCISION OF RIGHT ARM BRACHIAL AXILLARY GRAFT;  Surgeon: Marty Heck, MD;  Location: [place];  Service: Vascular;  Laterality: Right;   UPPER EXTREMITY VENOGRAPHY Bilateral 06/14/2021   Procedure: UPPER EXTREMITY VENOGRAPHY;  Surgeon: Waynetta Sandy, MD;  Location: [place] [facility];  Service: Cardiovascular;  Laterality: Bilateral;   VISCERAL ANGIOGRAPHY Right 06/17/2020   Procedure: CENTRAL VENO;  Surgeon: Cherre Robins, MD;  Location: [place] [facility];  Service: Cardiovascular;  Laterality: Right;    Allergies: Hydralazine hcl  Medications: Prior to Admission medications   Medication Sig Start Date End Date Taking? Authorizing Provider  acetaminophen (TYLENOL) 500 MG tablet Take 1,000 mg by mouth every 6 (six) hours as needed (for pain.).   Yes [provider]  calcium  carbonate (TUMS - DOSED IN MG ELEMENTAL CALCIUM) 500 MG chewable tablet Chew 1,000 mg by mouth daily as needed for indigestion or heartburn.   Yes [provider]  cetirizine (ZYRTEC) 10 MG tablet Take 10 mg by mouth daily as needed for allergies.   Yes [provider]  cinacalcet (SENSIPAR) 30 MG tablet Take 30 mg by mouth daily. 08/12/21  Yes [provider]  ELIQUIS 5 MG TABS tablet Take 1 tablet by mouth twice daily 03/21/22  Yes Dameron, Luna Fuse, DO  ferric citrate (AURYXIA) 1 GM 210 MG(Fe) tablet Take 420-840 mg by mouth See admin instructions. Take 4 tablets (840 mg) by mouth with each meal & take 2 tablets (420 mg) by mouth with each snack.   Yes [provider]  gabapentin (NEURONTIN) 100 MG capsule TAKE 1 CAPSULE BY MOUTH THREE TIMES DAILY 02/18/22  Yes Waynetta Sandy, MD  LANTUS 100 UNIT/ML injection Inject 0.05 mLs (5 Units total) into the skin 2 (two) times daily. 03/11/22  Yes Dameron, Luna Fuse, DO  levothyroxine (SYNTHROID) 150 MCG tablet TAKE 1 TABLET BY MOUTH ONCE DAILY BEFORE BREAKFAST 12/27/21  Yes Maxwell, Allee, MD  midodrine (PROAMATINE) 5 MG tablet Take 1 tablet (5 mg total) by mouth 3 (three) times daily  with meals. Patient taking differently: Take 5 mg by mouth Every Tuesday,Thursday,and Saturday with dialysis. 05/14/21  Yes Krishnan, Gokul, MD  multivitamin (RENA-VIT) TABS tablet Take 1 tablet by mouth at bedtime. 10/25/16  Yes Wallace, Catherine Lauren, MD  rosuvastatin (CRESTOR) 20 MG tablet Take 1 tablet by mouth once daily 01/08/22  Yes Dameron, Marisa, DO  Accu-Chek Softclix Lancets lancets Use as instructed 06/11/21   Sanford, James B, MD  Blood Glucose Monitoring Suppl (ACCU-CHEK GUIDE ME) w/Device KIT 1 kit by Does not apply route daily. 05/22/21   Sanford, James B, MD  glucose blood (ACCU-CHEK GUIDE) test strip 1 each by Other route 2 (two) times daily. Use as instructed 09/22/21   Dahbura, Anton, DO  Syringe, Disposable, (B-D  SYRINGE SLIP TIP 30CC) 30 ML MISC 1 Syringe by Does not apply route daily. 01/31/19   Kim, Rachel E, MD     Family History  Problem Relation Age of Onset   Heart disease Mother    Hypertension Mother     Social History   Socioeconomic History   Marital status: Single    Spouse name: Not on file   Number of children: Not on file   Years of education: Not on file   Highest education level: Not on file  Occupational History   Not on file  Tobacco Use   Smoking status: Never   Smokeless tobacco: Never  Vaping Use   Vaping Use: Never used  Substance and Sexual Activity   Alcohol use: No   Drug use: Yes    Frequency: 2.0 times per week    Types: Marijuana    Comment: Last use was on 08/17/21   Sexual activity: Yes  Other Topics Concern   Not on file  Social History Narrative   Not on file   Social Determinants of Health   Financial Resource Strain: Not on file  Food Insecurity: No Food Insecurity (05/21/2021)   Hunger Vital Sign    Worried About Running Out of Food in the Last Year: Never true    Ran Out of Food in the Last Year: Never true  Transportation Needs: Unmet Transportation Needs (05/21/2021)   PRAPARE - Transportation    Lack of Transportation (Medical): Yes    Lack of Transportation (Non-Medical): Yes  Physical Activity: Insufficiently Active (07/07/2021)   Exercise Vital Sign    Days of Exercise per Week: 4 days    Minutes of Exercise per Session: 30 min  Stress: Stress Concern Present (08/06/2021)   [employer] - Occupational Stress Questionnaire    Feeling of Stress : To some extent  Social Connections: Socially Isolated (06/07/2021)   Social Connection and Isolation Panel [NHANES]    Frequency of Communication with Friends and Family: More than three times a week    Frequency of Social Gatherings with Friends and Family: More than three times a week    Attends Religious Services: Never    Active Member of Clubs or  Organizations: No    Attends Club or Organization Meetings: Never    Marital Status: Never married    Review of Systems: A 12 point ROS discussed and pertinent positives are indicated in the HPI above.  All other systems are negative.  Review of Systems  Constitutional:  Negative for activity change, fatigue and fever.  Respiratory:  Negative for cough and shortness of breath.   Cardiovascular:  Negative for chest pain.  Gastrointestinal:  Negative for abdominal pain.  Neurological:  Negative   for weakness.  Psychiatric/Behavioral:  Negative for behavioral problems and confusion.     Vital Signs: BP 112/71   Pulse 85   Temp (!) 97.1 F (36.2 C) (Temporal)   Resp 20   Ht 6' 2" (1.88 m)   Wt (!) 325 lb (147.4 kg)   SpO2 99%   BMI 41.73 kg/m     Physical Exam Vitals reviewed.  HENT:     Mouth/Throat:     Mouth: Mucous membranes are moist.  Cardiovascular:     Rate and Rhythm: Normal rate and regular rhythm.     Heart sounds: Normal heart sounds.  Pulmonary:     Effort: Pulmonary effort is normal.     Breath sounds: Normal breath sounds.  Abdominal:     Palpations: Abdomen is soft.     Tenderness: There is no abdominal tenderness.  Musculoskeletal:        General: Normal range of motion.     Comments: Left upper arm graft. Faint pulse/bruit distally, becomes weaker/absent more proximal  Skin:    General: Skin is warm.  Neurological:     Mental Status: He is alert and oriented to person, place, and time.  Psychiatric:        Behavior: Behavior normal.     Imaging: IR THROMBECTOMY AV FISTULA W/THROMBOLYSIS/PTA INC/SHUNT/IMG LEFT  Result Date: 03/22/2022 INDICATION: Occluded left upper arm dialysis access which is an original left brachial artery to basilic vein fistula which was revised with transposition and placement of an interposition 7 mm graft to the axillary vein in March. Status post prior access thrombectomy and angioplasty on 02/02/2022. The access has  now become re-occluded. EXAM: 1. ULTRASOUND GUIDANCE FOR VASCULAR ACCESS OF LEFT UPPER ARM DIALYSIS ACCESS 2. LEFT UPPER ARM DIALYSIS ACCESS THROMBECTOMY INCLUDING MECHANICAL THROMBECTOMY AND ANGIOPLASTY OF THE DIALYSIS GRAFT AND VENOUS ANASTOMOSIS MEDICATIONS: 1 mg tPA. 4000 units intravenous heparin administered during the procedure. ANESTHESIA/SEDATION: Moderate (conscious) sedation was employed during this procedure. A total of Versed 2.0 mg and Fentanyl 150 mcg was administered intravenously. Moderate Sedation Time: 120 minutes. The patient's level of consciousness and vital signs were monitored continuously by radiology nursing throughout the procedure under my direct supervision. FLUOROSCOPY TIME:  Fluoroscopy Time: 15 minutes and 30 seconds. 69.0 mGy. COMPLICATIONS: None immediate. PROCEDURE: Informed written consent was obtained from the patient after a thorough discussion of the procedural risks, benefits and alternatives. All questions were addressed. Maximal Sterile Barrier Technique was utilized including caps, mask, sterile gowns, sterile gloves, sterile drape, hand hygiene and skin antiseptic. During the procedure local anesthesia was provided with 1% lidocaine. A timeout was performed prior to the initiation of the procedure. Antegrade access of the left upper arm dialysis fistula/graft was performed under direct ultrasound guidance with a micropuncture set. 1 mg of tPA was administered within the dialysis access via the a micropuncture dilator. A 6 French sheath was initially placed in an antegrade direction over a guidewire. A 5 French catheter was advanced over a guidewire to the level of the venous anastomosis. The venous anastomosis was crossed with a hydrophilic guidewire allowing catheter access into patent axillary vein. Venography was performed through the catheter to assess patency of central veins. The catheter was withdrawn to assess patency of the venous anastomosis. Mechanical  thrombectomy was performed in an antegrade direction through dialysis access to the level of the venous anastomosis with a 6 French Angiojet catheter. Balloon angioplasty was then performed across the venous anastomosis with a 7 mm x 4   cm Conquest balloon with multiple inflations across the venous anastomosis and extending into the distal aspect of the interposition graft. Retrograde access of the dialysis graft was then performed with a micropuncture set under direct ultrasound guidance. A 6 French sheath was placed. A 4 French Fogarty balloon catheter was then advanced under ultrasound and fluoroscopic guidance across the arterial anastomosis of the fistula portion of the access. Thrombectomy across the arterial anastomosis and arterial segment of the fistula was performed with the Fogarty catheter with multiple passes performed. Aspiration thrombectomy was performed through both antegrade and retrograde sheaths. Additional angiography was performed through both sheaths. The retrograde sheath was removed and a 2-0 Ethilon pursestring suture applied at the exit site. Additional balloon angioplasty was performed in the dialysis access as well as across the venous anastomosis as well as Angiojet thrombectomy. Antegrade sheath was upsized to 7 [place]. A Cleaner rotational thrombectomy device was then advanced into the outflow vein beyond the venous anastomosis and used in a retrograde fashion back into the dialysis access. Additional angiography was performed. Additional balloon angioplasty was performed across the venous anastomosis with an 8 mm x 4 cm Mustang balloon. Completion angiography was performed through the antegrade sheath. The antegrade sheath was removed and a 2-0 Ethilon pursestring suture applied at the exit site. FINDINGS: There is recurrent tight stenosis at the venous anastomosis between the interposition graft and axillary vein of roughly 90% caliber. This was treated with 7 mm balloon angioplasty.  After initial thrombectomy and retrograde Fogarty thrombectomy, arterial inflow was reestablished into the graft. At the level of the arterial anastomosis, mild irregular narrowing remains which is stable compared to completion imaging from the prior declot procedure in September and repeat arterial anastomotic angioplasty was not necessary. After additional mechanical thrombectomy, areas of intragraft stenosis were successfully treated with 7 mm balloon angioplasty. Additional rotational mechanical thrombectomy was also utilized to treat nonocclusive residual thrombus in the immediate outflow segment of the axillary vein and within the dialysis access. Residual stenosis of the venous anastomosis was treated with 8 mm balloon angioplasty resulting in improved appearance and no significant residual stenosis remaining on completion. IMPRESSION: Successful thrombectomy and angioplasty to reestablish flow in left upper arm dialysis access consisting of initial fistula segment and an interposition graft to the axillary vein. Recurrent stenosis of the venous anastomosis was treated with both 7 mm and 8 mm balloon angioplasty. Arterial anastomosis remained patent following prior balloon angioplasty with mild irregular stenosis present but no need for repeat angioplasty. ACCESS: This access remains amenable to future percutaneous interventions as clinically indicated. Electronically Signed   By: Aletta Edouard M.D.   On: 03/22/2022 13:14   IR US Guide Vasc Access Left  Result Date: 03/22/2022 INDICATION: Occluded left upper arm dialysis access which is an original left brachial artery to basilic vein fistula which was revised with transposition and placement of an interposition 7 mm graft to the axillary vein in March. Status post prior access thrombectomy and angioplasty on 02/02/2022. The access has now become re-occluded. EXAM: 1. ULTRASOUND GUIDANCE FOR VASCULAR ACCESS OF LEFT UPPER ARM DIALYSIS ACCESS 2. LEFT  UPPER ARM DIALYSIS ACCESS THROMBECTOMY INCLUDING MECHANICAL THROMBECTOMY AND ANGIOPLASTY OF THE DIALYSIS GRAFT AND VENOUS ANASTOMOSIS MEDICATIONS: 1 mg tPA. 4000 units intravenous heparin administered during the procedure. ANESTHESIA/SEDATION: Moderate (conscious) sedation was employed during this procedure. A total of Versed 2.0 mg and Fentanyl 150 mcg was administered intravenously. Moderate Sedation Time: 120 minutes. The patient's level of consciousness and vital signs  were monitored continuously by radiology nursing throughout the procedure under my direct supervision. FLUOROSCOPY TIME:  Fluoroscopy Time: 15 minutes and 30 seconds. 69.0 mGy. COMPLICATIONS: None immediate. PROCEDURE: Informed written consent was obtained from the patient after a thorough discussion of the procedural risks, benefits and alternatives. All questions were addressed. Maximal Sterile Barrier Technique was utilized including caps, mask, sterile gowns, sterile gloves, sterile drape, hand hygiene and skin antiseptic. During the procedure local anesthesia was provided with 1% lidocaine. A timeout was performed prior to the initiation of the procedure. Antegrade access of the left upper arm dialysis fistula/graft was performed under direct ultrasound guidance with a micropuncture set. 1 mg of tPA was administered within the dialysis access via the a micropuncture dilator. A 6 French sheath was initially placed in an antegrade direction over a guidewire. A 5 French catheter was advanced over a guidewire to the level of the venous anastomosis. The venous anastomosis was crossed with a hydrophilic guidewire allowing catheter access into patent axillary vein. Venography was performed through the catheter to assess patency of central veins. The catheter was withdrawn to assess patency of the venous anastomosis. Mechanical thrombectomy was performed in an antegrade direction through dialysis access to the level of the venous anastomosis with a 6  French Angiojet catheter. Balloon angioplasty was then performed across the venous anastomosis with a 7 mm x 4 cm Conquest balloon with multiple inflations across the venous anastomosis and extending into the distal aspect of the interposition graft. Retrograde access of the dialysis graft was then performed with a micropuncture set under direct ultrasound guidance. A 6 French sheath was placed. A 4 French Fogarty balloon catheter was then advanced under ultrasound and fluoroscopic guidance across the arterial anastomosis of the fistula portion of the access. Thrombectomy across the arterial anastomosis and arterial segment of the fistula was performed with the Fogarty catheter with multiple passes performed. Aspiration thrombectomy was performed through both antegrade and retrograde sheaths. Additional angiography was performed through both sheaths. The retrograde sheath was removed and a 2-0 Ethilon pursestring suture applied at the exit site. Additional balloon angioplasty was performed in the dialysis access as well as across the venous anastomosis as well as Angiojet thrombectomy. Antegrade sheath was upsized to 7 French. A Cleaner rotational thrombectomy device was then advanced into the outflow vein beyond the venous anastomosis and used in a retrograde fashion back into the dialysis access. Additional angiography was performed. Additional balloon angioplasty was performed across the venous anastomosis with an 8 mm x 4 cm Mustang balloon. Completion angiography was performed through the antegrade sheath. The antegrade sheath was removed and a 2-0 Ethilon pursestring suture applied at the exit site. FINDINGS: There is recurrent tight stenosis at the venous anastomosis between the interposition graft and axillary vein of roughly 90% caliber. This was treated with 7 mm balloon angioplasty. After initial thrombectomy and retrograde Fogarty thrombectomy, arterial inflow was reestablished into the graft. At the  level of the arterial anastomosis, mild irregular narrowing remains which is stable compared to completion imaging from the prior declot procedure in September and repeat arterial anastomotic angioplasty was not necessary. After additional mechanical thrombectomy, areas of intragraft stenosis were successfully treated with 7 mm balloon angioplasty. Additional rotational mechanical thrombectomy was also utilized to treat nonocclusive residual thrombus in the immediate outflow segment of the axillary vein and within the dialysis access. Residual stenosis of the venous anastomosis was treated with 8 mm balloon angioplasty resulting in improved appearance and no significant residual stenosis   remaining on completion. IMPRESSION: Successful thrombectomy and angioplasty to reestablish flow in left upper arm dialysis access consisting of initial fistula segment and an interposition graft to the axillary vein. Recurrent stenosis of the venous anastomosis was treated with both 7 mm and 8 mm balloon angioplasty. Arterial anastomosis remained patent following prior balloon angioplasty with mild irregular stenosis present but no need for repeat angioplasty. ACCESS: This access remains amenable to future percutaneous interventions as clinically indicated. Electronically Signed   By: Glenn  Yamagata M.D.   On: 03/22/2022 13:14    Labs:  CBC: Recent Labs    05/12/21 0346 05/14/21 0611 06/14/21 0626 07/02/21 0759 07/24/21 0812 08/20/21 0937 01/31/22 1230  WBC 11.1* 11.5*  --   --  9.2  --  11.6*  HGB 13.8 11.9*   < > 16.3 13.2 18.4* 13.6  HCT 40.1 36.6*   < > 48.0 41.8 54.0* 42.0  PLT 347 346  --   --  193  --  302   < > = values in this interval not displayed.     COAGS: Recent Labs    05/06/21 1600  INR 1.3*  APTT 34     BMP: Recent Labs    05/14/21 0611 06/14/21 0626 07/24/21 0812 08/20/21 0937 01/31/22 1230 03/22/22 0744  NA 134*   < > 138 137 139 139  K 4.3   < > 4.0 4.7 4.6 4.3  CL 99    < > 96* 99 97* 99  CO2 21*  --  23  --  24 23  GLUCOSE 114*   < > 101* 114* 99 121*  BUN 45*   < > 54* 47* 61* 51*  CALCIUM 9.3  --  8.4*  --  8.9 8.3*  CREATININE 12.74*   < > 16.43* 14.70* 19.13* 19.37*  GFRNONAA 5*  --  4*  --  3* 3*   < > = values in this interval not displayed.     LIVER FUNCTION TESTS: Recent Labs    05/06/21 1600 05/11/21 0824 05/14/21 0611  BILITOT 0.7  --   --   AST 21  --   --   ALT 12  --   --   ALKPHOS 69  --   --   PROT 9.8*  --   --   ALBUMIN 4.3 2.9* 3.3*     TUMOR MARKERS: No results for input(s): "AFPTM", "CEA", "CA199", "CHROMGRNA" in the last 8760 hours.  Assessment and Plan:  Scheduled for upper left arm dialysis fistula evaluation/intervention Possible tunneled dialysis catheter placement Pt is aware of procedure benefits and risks Including but not limited to Infection; bleeding; damage to structure or surrounding structures; blood clot or pulmonary embolus He is agreeable to proceed Consent signed and in chart  Thank you for this interesting consult.  I greatly enjoyed meeting Darreld Mckown and look forward to participating in their care.  A copy of this report was sent to the requesting provider on this date.  Electronically Signed: Kevin Bruning, PA-C 04/15/2022, 10:19 AM   I spent a total of    25 Minutes in face to face in clinical consultation, greater than 50% of which was counseling/coordinating care for left arm dialysis fistula evaluation/intervention  

## 2022-04-15 NOTE — Procedures (Signed)
Interventional Radiology Procedure:   Indications: Decreased flow in left upper arm AV fistula. Prior thrombectomy procedures   Procedure: Left upper fistulogram.  Unsuccessful attempt to re-cannulate occluded segment   Findings: Left upper arm fistula is patent but vein segment just beyond venous anastomosis is occluded with collateral flow into left axillary vein.  Unable to re-cannulate this segment.  Strongly recommended placement of tunneled dialysis catheter but patient declined.  Complications: No immediate complications noted.     EBL: Minimal  Plan: Recommend Surgery referral for new access or revision.  Patient will likely need a catheter in near future but patient declined catheter placement today.    Nikolaos Maddocks R. Anselm Pancoast, MD  Pager: [phone]

## 2022-04-18 ENCOUNTER — Emergency Department (HOSPITAL_COMMUNITY): Payer: Medicaid Other

## 2022-04-18 ENCOUNTER — Encounter (HOSPITAL_COMMUNITY): Payer: Self-pay

## 2022-04-18 ENCOUNTER — Other Ambulatory Visit: Payer: Self-pay

## 2022-04-18 ENCOUNTER — Inpatient Hospital Stay (HOSPITAL_COMMUNITY)
Admission: EM | Admit: 2022-04-18 | Discharge: 2022-04-20 | DRG: 314 | Disposition: A | Discharge status: Home / Self Care | Payer: Medicaid Other | Attending: Family Medicine | Admitting: Family Medicine

## 2022-04-18 DIAGNOSIS — Z992 Dependence on renal dialysis: Secondary | ICD-10-CM

## 2022-04-18 DIAGNOSIS — I10 Essential (primary) hypertension: Secondary | ICD-10-CM | POA: Diagnosis present

## 2022-04-18 DIAGNOSIS — T82510A Breakdown (mechanical) of surgically created arteriovenous fistula, initial encounter: Secondary | ICD-10-CM | POA: Diagnosis present

## 2022-04-18 DIAGNOSIS — E1122 Type 2 diabetes mellitus with diabetic chronic kidney disease: Secondary | ICD-10-CM | POA: Diagnosis present

## 2022-04-18 DIAGNOSIS — T829XXA Unspecified complication of cardiac and vascular prosthetic device, implant and graft, initial encounter: Secondary | ICD-10-CM

## 2022-04-18 DIAGNOSIS — I120 Hypertensive chronic kidney disease with stage 5 chronic kidney disease or end stage renal disease: Secondary | ICD-10-CM | POA: Diagnosis present

## 2022-04-18 DIAGNOSIS — Z79890 Hormone replacement therapy (postmenopausal): Secondary | ICD-10-CM

## 2022-04-18 DIAGNOSIS — E119 Type 2 diabetes mellitus without complications: Secondary | ICD-10-CM

## 2022-04-18 DIAGNOSIS — Z79899 Other long term (current) drug therapy: Secondary | ICD-10-CM

## 2022-04-18 DIAGNOSIS — E1142 Type 2 diabetes mellitus with diabetic polyneuropathy: Secondary | ICD-10-CM | POA: Diagnosis present

## 2022-04-18 DIAGNOSIS — T829XXD Unspecified complication of cardiac and vascular prosthetic device, implant and graft, subsequent encounter: Secondary | ICD-10-CM

## 2022-04-18 DIAGNOSIS — Z6841 Body Mass Index (BMI) 40.0 and over, adult: Secondary | ICD-10-CM

## 2022-04-18 DIAGNOSIS — D631 Anemia in chronic kidney disease: Secondary | ICD-10-CM | POA: Diagnosis present

## 2022-04-18 DIAGNOSIS — E875 Hyperkalemia: Secondary | ICD-10-CM | POA: Diagnosis present

## 2022-04-18 DIAGNOSIS — Y832 Surgical operation with anastomosis, bypass or graft as the cause of abnormal reaction of the patient, or of later complication, without mention of misadventure at the time of the procedure: Secondary | ICD-10-CM | POA: Diagnosis present

## 2022-04-18 DIAGNOSIS — E8770 Fluid overload, unspecified: Secondary | ICD-10-CM | POA: Diagnosis present

## 2022-04-18 DIAGNOSIS — E785 Hyperlipidemia, unspecified: Secondary | ICD-10-CM | POA: Diagnosis present

## 2022-04-18 DIAGNOSIS — Y712 Prosthetic and other implants, materials and accessory cardiovascular devices associated with adverse incidents: Secondary | ICD-10-CM | POA: Diagnosis present

## 2022-04-18 DIAGNOSIS — Z89412 Acquired absence of left great toe: Secondary | ICD-10-CM

## 2022-04-18 DIAGNOSIS — T8249XA Other complication of vascular dialysis catheter, initial encounter: Secondary | ICD-10-CM | POA: Diagnosis present

## 2022-04-18 DIAGNOSIS — Z8249 Family history of ischemic heart disease and other diseases of the circulatory system: Secondary | ICD-10-CM

## 2022-04-18 DIAGNOSIS — T82818A Embolism of vascular prosthetic devices, implants and grafts, initial encounter: Principal | ICD-10-CM | POA: Diagnosis present

## 2022-04-18 DIAGNOSIS — N186 End stage renal disease: Secondary | ICD-10-CM | POA: Diagnosis not present

## 2022-04-18 DIAGNOSIS — Z7901 Long term (current) use of anticoagulants: Secondary | ICD-10-CM

## 2022-04-18 DIAGNOSIS — Z794 Long term (current) use of insulin: Secondary | ICD-10-CM

## 2022-04-18 DIAGNOSIS — E039 Hypothyroidism, unspecified: Secondary | ICD-10-CM | POA: Diagnosis present

## 2022-04-18 DIAGNOSIS — R0602 Shortness of breath: Secondary | ICD-10-CM | POA: Diagnosis not present

## 2022-04-18 DIAGNOSIS — T8249XD Other complication of vascular dialysis catheter, subsequent encounter: Secondary | ICD-10-CM | POA: Diagnosis not present

## 2022-04-18 DIAGNOSIS — K219 Gastro-esophageal reflux disease without esophagitis: Secondary | ICD-10-CM | POA: Diagnosis present

## 2022-04-18 LAB — PHOSPHORUS: Phosphorus: 9.5 mg/dL — ABNORMAL HIGH (ref 2.5–4.6)

## 2022-04-18 LAB — COMPREHENSIVE METABOLIC PANEL
ALT: 13 U/L (ref 0–44)
AST: 12 U/L — ABNORMAL LOW (ref 15–41)
Albumin: 4.1 g/dL (ref 3.5–5.0)
Alkaline Phosphatase: 51 U/L (ref 38–126)
Anion gap: 20 — ABNORMAL HIGH (ref 5–15)
BUN: 81 mg/dL — ABNORMAL HIGH (ref 6–20)
CO2: 17 mmol/L — ABNORMAL LOW (ref 22–32)
Calcium: 8.3 mg/dL — ABNORMAL LOW (ref 8.9–10.3)
Chloride: 102 mmol/L (ref 98–111)
Creatinine, Ser: 25.22 mg/dL — ABNORMAL HIGH (ref 0.61–1.24)
GFR, Estimated: 2 mL/min — ABNORMAL LOW (ref >60)
Glucose, Bld: 96 mg/dL (ref 70–99)
Potassium: 5 mmol/L (ref 3.5–5.1)
Sodium: 139 mmol/L (ref 135–145)
Total Bilirubin: 0.7 mg/dL (ref 0.3–1.2)
Total Protein: 7.6 g/dL (ref 6.5–8.1)

## 2022-04-18 LAB — CBC WITH DIFFERENTIAL/PLATELET
Abs Immature Granulocytes: 0.03 10*3/uL (ref 0.00–0.07)
Basophils Absolute: 0.1 10*3/uL (ref 0.0–0.1)
Basophils Relative: 1 %
Eosinophils Absolute: 0.1 10*3/uL (ref 0.0–0.5)
Eosinophils Relative: 1 %
HCT: 42.4 % (ref 39.0–52.0)
Hemoglobin: 13.8 g/dL (ref 13.0–17.0)
Immature Granulocytes: 0 %
Lymphocytes Relative: 26 %
Lymphs Abs: 2.3 10*3/uL (ref 0.7–4.0)
MCH: 27 pg (ref 26.0–34.0)
MCHC: 32.5 g/dL (ref 30.0–36.0)
MCV: 82.8 fL (ref 80.0–100.0)
Monocytes Absolute: 0.6 10*3/uL (ref 0.1–1.0)
Monocytes Relative: 6 %
Neutro Abs: 5.9 10*3/uL (ref 1.7–7.7)
Neutrophils Relative %: 66 %
Platelets: 269 10*3/uL (ref 150–400)
RBC: 5.12 MIL/uL (ref 4.22–5.81)
RDW: 17.5 % — ABNORMAL HIGH (ref 11.5–15.5)
WBC: 8.9 10*3/uL (ref 4.0–10.5)
nRBC: 0 % (ref 0.0–0.2)

## 2022-04-18 LAB — BRAIN NATRIURETIC PEPTIDE: B Natriuretic Peptide: 311 pg/mL — ABNORMAL HIGH (ref 0.0–100.0)

## 2022-04-18 LAB — GLUCOSE, CAPILLARY: Glucose-Capillary: 108 mg/dL — ABNORMAL HIGH (ref 70–99)

## 2022-04-18 LAB — CBG MONITORING, ED
Glucose-Capillary: 104 mg/dL — ABNORMAL HIGH (ref 70–99)
Glucose-Capillary: 142 mg/dL — ABNORMAL HIGH (ref 70–99)

## 2022-04-18 LAB — HEPATITIS B SURFACE ANTIGEN: Hepatitis B Surface Ag: NONREACTIVE

## 2022-04-18 MED ORDER — APIXABAN 5 MG PO TABS: 5.0000 mg | ORAL_TABLET | Freq: Two times a day (BID) | ORAL | Status: DC

## 2022-04-18 MED ORDER — RENA-VITE PO TABS
1.0000 | ORAL_TABLET | Freq: Every day | ORAL | Status: DC
  Administered 2022-04-18 – 2022-04-19 (×2): 1 via ORAL
  Filled 2022-04-18 (×2): qty 1

## 2022-04-18 MED ORDER — ROSUVASTATIN CALCIUM 20 MG PO TABS
20.0000 mg | ORAL_TABLET | Freq: Every day | ORAL | Status: DC
  Administered 2022-04-18 – 2022-04-20 (×3): 20 mg via ORAL
  Filled 2022-04-18 (×4): qty 1

## 2022-04-18 MED ORDER — ACETAMINOPHEN 325 MG PO TABS
650.0000 mg | ORAL_TABLET | Freq: Four times a day (QID) | ORAL | Status: DC | PRN
  Administered 2022-04-18: 650 mg via ORAL
  Filled 2022-04-18: qty 2

## 2022-04-18 MED ORDER — ACETAMINOPHEN 500 MG PO TABS
1000.0000 mg | ORAL_TABLET | Freq: Four times a day (QID) | ORAL | Status: DC | PRN
  Administered 2022-04-19: 975 mg via ORAL
  Administered 2022-04-20: 1000 mg via ORAL
  Filled 2022-04-18: qty 2

## 2022-04-18 MED ORDER — LEVOTHYROXINE SODIUM 75 MCG PO TABS
150.0000 ug | ORAL_TABLET | Freq: Every day | ORAL | Status: DC
  Administered 2022-04-20: 150 ug via ORAL
  Filled 2022-04-18: qty 2

## 2022-04-18 MED ORDER — CEFAZOLIN SODIUM-DEXTROSE 2-4 GM/100ML-% IV SOLN: 2.0000 g | INTRAVENOUS | Status: AC

## 2022-04-18 MED ORDER — GABAPENTIN 100 MG PO CAPS
100.0000 mg | ORAL_CAPSULE | Freq: Every morning | ORAL | Status: DC
  Administered 2022-04-19 – 2022-04-20 (×2): 100 mg via ORAL
  Filled 2022-04-18 (×2): qty 1

## 2022-04-18 MED ORDER — CINACALCET HCL 30 MG PO TABS
30.0000 mg | ORAL_TABLET | Freq: Every day | ORAL | Status: DC
  Administered 2022-04-19 – 2022-04-20 (×2): 30 mg via ORAL
  Filled 2022-04-18 (×2): qty 1

## 2022-04-18 MED ORDER — INSULIN ASPART 100 UNIT/ML IJ SOLN
0.0000 [IU] | Freq: Three times a day (TID) | INTRAMUSCULAR | Status: DC
  Administered 2022-04-19: 1 [IU] via SUBCUTANEOUS

## 2022-04-18 MED ORDER — FERRIC CITRATE 1 GM 210 MG(FE) PO TABS
840.0000 mg | ORAL_TABLET | Freq: Three times a day (TID) | ORAL | Status: DC
  Administered 2022-04-19 – 2022-04-20 (×3): 840 mg via ORAL
  Filled 2022-04-18 (×6): qty 4

## 2022-04-18 MED ORDER — CHLORHEXIDINE GLUCONATE CLOTH 2 % EX PADS
6.0000 | MEDICATED_PAD | Freq: Every day | CUTANEOUS | Status: DC
  Administered 2022-04-19 – 2022-04-20 (×2): 6 via TOPICAL

## 2022-04-18 MED ORDER — LIDOCAINE 5 % EX PTCH
1.0000 | MEDICATED_PATCH | CUTANEOUS | Status: DC
  Administered 2022-04-18 – 2022-04-19 (×2): 1 via TRANSDERMAL
  Filled 2022-04-18 (×2): qty 1

## 2022-04-18 MED ORDER — MIDODRINE HCL 5 MG PO TABS
5.0000 mg | ORAL_TABLET | ORAL | Status: DC
  Filled 2022-04-18: qty 1

## 2022-04-18 MED ORDER — GABAPENTIN 100 MG PO CAPS
200.0000 mg | ORAL_CAPSULE | Freq: Every day | ORAL | Status: DC
  Administered 2022-04-18 – 2022-04-19 (×2): 200 mg via ORAL
  Filled 2022-04-18 (×2): qty 2

## 2022-04-18 MED ORDER — DOXERCALCIFEROL 4 MCG/2ML IV SOLN
2.0000 ug | INTRAVENOUS | Status: DC
  Administered 2022-04-19: 2 ug via INTRAVENOUS
  Filled 2022-04-18: qty 2

## 2022-04-18 MED ORDER — APIXABAN 5 MG PO TABS
5.0000 mg | ORAL_TABLET | Freq: Two times a day (BID) | ORAL | Status: DC
  Administered 2022-04-18 – 2022-04-20 (×4): 5 mg via ORAL
  Filled 2022-04-18 (×4): qty 1

## 2022-04-18 MED ORDER — FERRIC CITRATE 1 GM 210 MG(FE) PO TABS: 420.0000 mg | ORAL_TABLET | ORAL | Status: DC | PRN

## 2022-04-18 MED ORDER — MELATONIN 5 MG PO TABS
5.0000 mg | ORAL_TABLET | Freq: Every evening | ORAL | Status: DC | PRN
  Administered 2022-04-18 – 2022-04-19 (×2): 5 mg via ORAL
  Filled 2022-04-18 (×2): qty 1

## 2022-04-18 NOTE — Consult Note (Addendum)
Chief Complaint: Patient was seen in consultation today for AV fistula malfunction  Referring Physician(s): Dr. Renaldo Reel  Supervising Physician: Michaelle Birks  Patient Status: [facility] - ED  History of Present Illness: Paul [name] is a 31 y.o. male with past medical history of DM, CKD, HLD, HTN, ESRD on HD via L upper arm AV graft. He is familiar with IR from several previous declots of this graft- 1/31, 02/02/22, and recently on 04/15/22 at which time his graft was found to be patent, but a vein segment just beyond venous anastomosis is occluded with collateral flow into left axillary vein.  It was suggested the patient undergo HD catheter placement with surgical evaluation of his graft, however he refused HD catheter placement and re-attempted outpatient dialysis this past week which was unsuccessful.  He now returns to the ED after having not been able to undergo dialysis in several days.  He is now agreeable to HD catheter placement.   Past Medical History:  Diagnosis Date   Acute hypoxemic respiratory failure ([place]) 09/11/2016   Anemia    Chronic kidney disease    ARF on CRF Dialysis T/TH/Sa   Diabetes mellitus    Type II   End stage renal disease on dialysis ([place])    [place]    GERD (gastroesophageal reflux disease)    diet controlled   HLD (hyperlipidemia)    Hypertension    Hypothyroidism    Morbid obesity ([place])    Sacral wound    resolved   Thyroid disease     Past Surgical History:  Procedure Laterality Date   AMPUTATION Left 05/08/2021   Procedure: AMPUTATION GREAT TOE;  Surgeon: Newt Minion, MD;  Location: [place];  Service: Orthopedics;  Laterality: Left;   AV FISTULA PLACEMENT Left 09/26/2016   Procedure: LEFT UPPER ARM ARTERIOVENOUS (AV) FISTULA CREATION;  Surgeon: Elam Dutch, MD;  Location: [place];  Service: Vascular;  Laterality: Left;   AV FISTULA PLACEMENT Left 12/02/2016   Procedure: INSERTION OF ARTERIOVENOUS GORE-TEX GRAFT LEFT UPPER   ARM USING A 4-7MM BY 45CM GRAFT ;  Surgeon: Rosetta Posner, MD;  Location: [place];  Service: Vascular;  Laterality: Left;   AV FISTULA PLACEMENT Right 07/03/2020   Procedure: INSERTION OF ARTERIOVENOUS (AV) GORE-TEX GRAFT RIGHT UPPER ARM;  Surgeon: Marty Heck, MD;  Location: [place];  Service: Vascular;  Laterality: Right;   AV FISTULA PLACEMENT Left 07/02/2021   Procedure: LEFT UPPER EXTREMITY ARTERIOVENOUS FISTULA CREATION;  Surgeon: Waynetta Sandy, MD;  Location: [place];  Service: Vascular;  Laterality: Left;   [place] Right 05/06/2020   Procedure: FIRST STAGE RIGHT [place];  Surgeon: Serafina Mitchell, MD;  Location: [place];  Service: Vascular;  Laterality: Right;   [place] Left 08/20/2021   Procedure: LEFT ARM SECOND STAGE WITH ARTERIOVENOUS GRAFT;  Surgeon: Waynetta Sandy, MD;  Location: [place];  Service: Vascular;  Laterality: Left;   EXCHANGE OF A DIALYSIS CATHETER Right 10/08/2016   Procedure: EXCHANGE OF A DIALYSIS CATHETER-RIGHT INTERNAL JUGULAR;  Surgeon: Conrad Bison, MD;  Location: [place];  Service: Vascular;  Laterality: Right;   INSERTION OF DIALYSIS CATHETER Right 09/26/2016   Procedure: INSERTION OF DIALYSIS CATHETER - Right Internal Jugular Placement;  Surgeon: Elam Dutch, MD;  Location: [place];  Service: Vascular;  Laterality: Right;   INSERTION OF DIALYSIS CATHETER Left 03/22/2018   Procedure: INSERTION OF DIALYSIS CATHETER;  Surgeon: Marty Heck,  MD;  Location: [place];  Service: Vascular;  Laterality: Left;   IR DIALY SHUNT INTRO NEEDLE/INTRACATH INITIAL W/IMG LEFT Left 04/15/2022   IR FLUORO GUIDE CV LINE RIGHT  03/26/2021   IR FLUORO GUIDE CV LINE RIGHT  05/13/2021   IR REMOVAL TUN CV CATH W/O FL  05/11/2021   IR REMOVAL TUN CV CATH W/O FL  10/06/2021   IR THROMBECTOMY AV FISTULA W/THROMBOLYSIS/PTA INC/SHUNT/IMG LEFT Left 02/02/2022   IR THROMBECTOMY AV FISTULA W/THROMBOLYSIS/PTA  INC/SHUNT/IMG LEFT Left 03/22/2022   IR US GUIDE VASC ACCESS LEFT  02/02/2022   IR US GUIDE VASC ACCESS LEFT  03/22/2022   IR US GUIDE VASC ACCESS LEFT  04/15/2022   IR US GUIDE VASC ACCESS RIGHT  03/26/2021   IR US GUIDE VASC ACCESS RIGHT  05/13/2021   REVISON OF ARTERIOVENOUS FISTULA Left 03/22/2018   Procedure: LEFT UPPER EXTREMITY ARTERIOVENOUS  REVISION WITH GORE-TEX GRAFT.;  Surgeon: Marty Heck, MD;  Location[facility];  Service: Vascular;  Laterality: Left;   TEE WITHOUT CARDIOVERSION N/A 05/12/2021   Procedure: TRANSESOPHAGEAL ECHOCARDIOGRAM (TEE);  Surgeon: Werner Lean, MD;  Location: [facility] ENDOSCOPY;  Service: Cardiovascular;  Laterality: N/A;   THROMBECTOMY Left 08/26/2019   thrombectomy of LUA loop AVG   THROMBECTOMY AND REVISION OF ARTERIOVENTOUS (AV) GORETEX  GRAFT Right 07/27/2020   Procedure: INSERTION OF RIGHT ARM LOOP GRAFT WITH EXCISION OF RIGHT ARM BRACHIAL AXILLARY GRAFT;  Surgeon: Marty Heck, MD;  Location: [place];  Service: Vascular;  Laterality: Right;   UPPER EXTREMITY VENOGRAPHY Bilateral 06/14/2021   Procedure: UPPER EXTREMITY VENOGRAPHY;  Surgeon: Waynetta Sandy, MD;  Location: [place] [facility];  Service: Cardiovascular;  Laterality: Bilateral;   VISCERAL ANGIOGRAPHY Right 06/17/2020   Procedure: CENTRAL VENO;  Surgeon: Cherre Robins, MD;  Location: [employer] [facility];  Service: Cardiovascular;  Laterality: Right;    Allergies: Hydralazine hcl  Medications: Prior to Admission medications   Medication Sig Start Date End Date Taking? Authorizing Provider  Accu-Chek Softclix Lancets lancets Use as instructed 06/11/21   Eppie Gibson, MD  acetaminophen (TYLENOL) 500 MG tablet Take 1,000 mg by mouth every 6 (six) hours as needed (for pain.).    [provider]  Blood Glucose Monitoring Suppl (ACCU-CHEK GUIDE ME) w/Device KIT 1 kit by Does not apply route daily. 05/22/21   Eppie Gibson, MD  calcium carbonate (TUMS -  DOSED IN MG ELEMENTAL CALCIUM) 500 MG chewable tablet Chew 1,000 mg by mouth daily as needed for indigestion or heartburn.    [provider]  cetirizine (ZYRTEC) 10 MG tablet Take 10 mg by mouth daily as needed for allergies.    [provider]  cinacalcet (SENSIPAR) 30 MG tablet Take 30 mg by mouth daily. 08/12/21   [provider]  ELIQUIS 5 MG TABS tablet Take 1 tablet by mouth twice daily 04/13/22   Dameron, Luna Fuse, DO  ferric citrate (AURYXIA) 1 GM 210 MG(Fe) tablet Take 420-840 mg by mouth See admin instructions. Take 4 tablets (840 mg) by mouth with each meal & take 2 tablets (420 mg) by mouth with each snack.    [provider]  gabapentin (NEURONTIN) 100 MG capsule TAKE 1 CAPSULE BY MOUTH THREE TIMES DAILY 04/11/22   Waynetta Sandy, MD  glucose blood (ACCU-CHEK GUIDE) test strip 1 each by Other route 2 (two) times daily. Use as instructed 09/22/21   Wells Guiles, DO  LANTUS 100 UNIT/ML injection Inject 0.05 mLs (5  Units total) into the skin 2 (two) times daily. 03/11/22   Dameron, Luna Fuse, DO  levothyroxine (SYNTHROID) 150 MCG tablet TAKE 1 TABLET BY MOUTH ONCE DAILY BEFORE BREAKFAST 04/18/22   Dameron, Luna Fuse, DO  midodrine (PROAMATINE) 5 MG tablet Take 1 tablet (5 mg total) by mouth 3 (three) times daily with meals. Patient taking differently: Take 5 mg by mouth Every Tuesday,Thursday,and Saturday with dialysis. 05/14/21   Bonnielee Haff, MD  multivitamin (RENA-VIT) TABS tablet Take 1 tablet by mouth at bedtime. 10/25/16   Nicolette Bang, MD  rosuvastatin (CRESTOR) 20 MG tablet Take 1 tablet by mouth once daily 01/08/22   Dameron, Luna Fuse, DO  Syringe, Disposable, (B-D SYRINGE SLIP TIP 30CC) 30 ML MISC 1 Syringe by Does not apply route daily. 01/31/19   Wilber Oliphant, MD     Family History  Problem Relation Age of Onset   Heart disease Mother    Hypertension Mother     Social History   Socioeconomic History   Marital status:  Single    Spouse name: Not on file   Number of children: Not on file   Years of education: Not on file   Highest education level: Not on file  Occupational History   Not on file  Tobacco Use   Smoking status: Never   Smokeless tobacco: Never  Vaping Use   Vaping Use: Never used  Substance and Sexual Activity   Alcohol use: No   Drug use: Yes    Frequency: 2.0 times per week    Types: Marijuana    Comment: Last use was on 08/17/21   Sexual activity: Yes  Other Topics Concern   Not on file  Social History Narrative   Not on file   Social Determinants of Health   Financial Resource Strain: Not on file  Food Insecurity: No Food Insecurity (05/21/2021)   Hunger Vital Sign    Worried About Running Out of Food in the Last Year: Never true    Ran Out of Food in the Last Year: Never true  Transportation Needs: Unmet Transportation Needs (05/21/2021)   PRAPARE - Transportation    Lack of Transportation (Medical): Yes    Lack of Transportation (Non-Medical): Yes  Physical Activity: Insufficiently Active (07/07/2021)   Exercise Vital Sign    Days of Exercise per Week: 4 days    Minutes of Exercise per Session: 30 min  Stress: Stress Concern Present (08/06/2021)   [place]    Feeling of Stress : To some extent  Social Connections: Socially Isolated (06/07/2021)   Social Connection and Isolation Panel [NHANES]    Frequency of Communication with Friends and Family: More than three times a week    Frequency of Social Gatherings with Friends and Family: More than three times a week    Attends Religious Services: Never    Marine scientist or Organizations: No    Attends Archivist Meetings: Never    Marital Status: Never married     Review of Systems: A 12 point ROS discussed and pertinent positives are indicated in the HPI above.  All other systems are negative.  Review of Systems  Constitutional:   Negative for fatigue and fever.  Respiratory:  Negative for cough and shortness of breath.   Cardiovascular:  Negative for chest pain.  Gastrointestinal:  Negative for abdominal pain.  Musculoskeletal:  Negative for back pain.  Psychiatric/Behavioral:  Negative for behavioral problems and confusion.  Vital Signs: BP (!) 136/108 (BP Location: Right Arm)   Pulse 77   Temp 97.8 F (36.6 C)   Resp 18   Ht _0  (1.88 m)   Wt (!) 325 lb (147.4 kg)   SpO2 99%   BMI 41.73 kg/m   Physical Exam Vitals and nursing note reviewed.  Constitutional:      General: He is not in acute distress.    Appearance: Normal appearance. He is not ill-appearing.  HENT:     Mouth/Throat:     Mouth: Mucous membranes are moist.     Pharynx: Oropharynx is clear.  Cardiovascular:     Rate and Rhythm: Normal rate and regular rhythm.  Pulmonary:     Effort: Pulmonary effort is normal.     Breath sounds: Normal breath sounds.  Musculoskeletal:     Comments: AVF/G without bruit or thrill.   Neurological:     General: No focal deficit present.     Mental Status: He is alert and oriented to person, place, and time. Mental status is at baseline.  Psychiatric:        Mood and Affect: Mood normal.        Behavior: Behavior normal.        Thought Content: Thought content normal.        Judgment: Judgment normal.      MD Evaluation Airway: WNL Heart: WNL Abdomen: WNL Chest/ Lungs: WNL ASA  Classification: 3 Mallampati/Airway Score: Two   Imaging: DG Chest 2 View  Result Date: 04/18/2022 CLINICAL DATA:  Shortness of breath for 1-2 days. History of diabetes and end-stage renal disease. EXAM: CHEST - 2 VIEW COMPARISON:  Radiographs 05/06/2021 and 03/22/2018. FINDINGS: Hemodialysis catheter has been removed. A vascular stent is noted within the right axilla. The heart size and mediastinal contours are normal. The lungs are clear. There is no pleural effusion or pneumothorax. No acute osseous  findings are identified. IMPRESSION: No active cardiopulmonary process. Electronically Signed   By: Richardean Sale M.D.   On: 04/18/2022 10:38   IR DIALY SHUNT INTRO NEEDLE/INTRACATH INITIAL W/IMG LEFT  Result Date: 04/15/2022 INDICATION: 31 year old with end-stage renal disease. History of brachial artery to basilic vein fistula with placement of interposition graft to the axillary vein. Patient underwent AV fistula declot procedures on 02/02/2022 and 03/22/2022. Patient now presents with abnormal flows. EXAM: 1. Left upper extremity fistulogram 2. Ultrasound guidance for vascular access 3. Unsuccessful recanalization of the occluded outflow vein MEDICATIONS: Moderate sedation ANESTHESIA/SEDATION: Moderate (conscious) sedation was employed during this procedure. A total of Versed 0.26m and fentanyl 25 mcg was administered intravenously at the order of the provider performing the procedure. Total intra-service moderate sedation time: 45 minutes. Patient's level of consciousness and vital signs were monitored continuously by radiology nurse throughout the procedure under the supervision of the provider performing the procedure. FLUOROSCOPY: Radiation Exposure Index (as provided by the fluoroscopic device): 124 mGy Kerma CONTRAST:  50 mL Omnipaque [id number]: None immediate. PROCEDURE: The procedure was explained to the patient. The risks and benefits of the procedure were discussed and the patient's questions were addressed. Informed consent was obtained from the patient. The left upper arm was prepped and draped in sterile fashion. Ultrasound confirmed a patent left AV fistula. Skin was anesthetized with 1% lidocaine. Using ultrasound guidance, an Angiocath was directed into the AV fistula and fistulogram images were obtained. Bentson wire was advanced and the Angiocath was exchanged for a 6 [place] sheath. Five French catheter was  advanced into the outflow vein and additional fistulogram images  were obtained. Multiple attempts were made to cannulate the occluded outflow vein segment. These attempts to cannulate this vein were unsuccessful. Final fistulogram images were obtained. The vascular sheath was removed with a pursestring suture. Bandage was placed. FINDINGS: The AV fistula/graft was patent. However, there was a short segment occlusion of the outflow vein near the venous anastomosis and the AV fistula is draining through venous collateral flow. Could not advance a catheter or wire into the occluded segment and could not recanalize this occluded segment. Central veins are patent. Arterial anastomosis is patent but there may be at least mild narrowing at the anastomosis. IMPRESSION: 1. Left upper extremity fistula/graft is patent but there is a short segment occlusion of the outflow vein in the upper arm. The AV fistula is draining through collateral venous flow. Unsuccessful recanalization of this occluded outflow vein. Recommend surgical consultation. 2. Probable stenosis near the arterial anastomosis. 3. Recommended placement of a tunneled dialysis catheter but the patient declined catheter placement at this time. Electronically Signed   By: Markus Daft M.D.   On: 04/15/2022 18:43   IR US Guide Vasc Access Left  Result Date: 04/15/2022 INDICATION: 31 year old with end-stage renal disease. History of brachial artery to basilic vein fistula with placement of interposition graft to the axillary vein. Patient underwent AV fistula declot procedures on 02/02/2022 and 03/22/2022. Patient now presents with abnormal flows. EXAM: 1. Left upper extremity fistulogram 2. Ultrasound guidance for vascular access 3. Unsuccessful recanalization of the occluded outflow vein MEDICATIONS: Moderate sedation ANESTHESIA/SEDATION: Moderate (conscious) sedation was employed during this procedure. A total of Versed 0.31m and fentanyl 25 mcg was administered intravenously at the order of the provider performing the  procedure. Total intra-service moderate sedation time: 45 minutes. Patient's level of consciousness and vital signs were monitored continuously by radiology nurse throughout the procedure under the supervision of the provider performing the procedure. FLUOROSCOPY: Radiation Exposure Index (as provided by the fluoroscopic device): 124 mGy Kerma CONTRAST:  50 mL Omnipaque [id number]: None immediate. PROCEDURE: The procedure was explained to the patient. The risks and benefits of the procedure were discussed and the patient's questions were addressed. Informed consent was obtained from the patient. The left upper arm was prepped and draped in sterile fashion. Ultrasound confirmed a patent left AV fistula. Skin was anesthetized with 1% lidocaine. Using ultrasound guidance, an Angiocath was directed into the AV fistula and fistulogram images were obtained. Bentson wire was advanced and the Angiocath was exchanged for a 6 [place] sheath. Five French catheter was advanced into the outflow vein and additional fistulogram images were obtained. Multiple attempts were made to cannulate the occluded outflow vein segment. These attempts to cannulate this vein were unsuccessful. Final fistulogram images were obtained. The vascular sheath was removed with a pursestring suture. Bandage was placed. FINDINGS: The AV fistula/graft was patent. However, there was a short segment occlusion of the outflow vein near the venous anastomosis and the AV fistula is draining through venous collateral flow. Could not advance a catheter or wire into the occluded segment and could not recanalize this occluded segment. Central veins are patent. Arterial anastomosis is patent but there may be at least mild narrowing at the anastomosis. IMPRESSION: 1. Left upper extremity fistula/graft is patent but there is a short segment occlusion of the outflow vein in the upper arm. The AV fistula is draining through collateral venous flow.  Unsuccessful recanalization of this occluded outflow vein. Recommend  surgical consultation. 2. Probable stenosis near the arterial anastomosis. 3. Recommended placement of a tunneled dialysis catheter but the patient declined catheter placement at this time. Electronically Signed   By: Markus Daft M.D.   On: 04/15/2022 18:43   IR THROMBECTOMY AV FISTULA W/THROMBOLYSIS/PTA INC/SHUNT/IMG LEFT  Result Date: 03/22/2022 INDICATION: Occluded left upper arm dialysis access which is an original left brachial artery to basilic vein fistula which was revised with transposition and placement of an interposition 7 mm graft to the axillary vein in March. Status post prior access thrombectomy and angioplasty on 02/02/2022. The access has now become re-occluded. EXAM: 1. ULTRASOUND GUIDANCE FOR VASCULAR ACCESS OF LEFT UPPER ARM DIALYSIS ACCESS 2. LEFT UPPER ARM DIALYSIS ACCESS THROMBECTOMY INCLUDING MECHANICAL THROMBECTOMY AND ANGIOPLASTY OF THE DIALYSIS GRAFT AND VENOUS ANASTOMOSIS MEDICATIONS: 1 mg tPA. 4000 units intravenous heparin administered during the procedure. ANESTHESIA/SEDATION: Moderate (conscious) sedation was employed during this procedure. A total of Versed 2.0 mg and Fentanyl 150 mcg was administered intravenously. Moderate Sedation Time: 120 minutes. The patient's level of consciousness and vital signs were monitored continuously by radiology nursing throughout the procedure under my direct supervision. FLUOROSCOPY TIME:  Fluoroscopy Time: 15 minutes and 30 seconds. 69.0 mGy. COMPLICATIONS: None immediate. PROCEDURE: Informed written consent was obtained from the patient after a thorough discussion of the procedural risks, benefits and alternatives. All questions were addressed. Maximal Sterile Barrier Technique was utilized including caps, mask, sterile gowns, sterile gloves, sterile drape, hand hygiene and skin antiseptic. During the procedure local anesthesia was provided with 1% lidocaine. A timeout was  performed prior to the initiation of the procedure. Antegrade access of the left upper arm dialysis fistula/graft was performed under direct ultrasound guidance with a micropuncture set. 1 mg of tPA was administered within the dialysis access via the a micropuncture dilator. A 6 French sheath was initially placed in an antegrade direction over a guidewire. A 5 French catheter was advanced over a guidewire to the level of the venous anastomosis. The venous anastomosis was crossed with a hydrophilic guidewire allowing catheter access into patent axillary vein. Venography was performed through the catheter to assess patency of central veins. The catheter was withdrawn to assess patency of the venous anastomosis. Mechanical thrombectomy was performed in an antegrade direction through dialysis access to the level of the venous anastomosis with a 6 [place] Angiojet catheter. Balloon angioplasty was then performed across the venous anastomosis with a 7 mm x 4 cm Conquest balloon with multiple inflations across the venous anastomosis and extending into the distal aspect of the interposition graft. Retrograde access of the dialysis graft was then performed with a micropuncture set under direct ultrasound guidance. A 6 French sheath was placed. A 4 French Fogarty balloon catheter was then advanced under ultrasound and fluoroscopic guidance across the arterial anastomosis of the fistula portion of the access. Thrombectomy across the arterial anastomosis and arterial segment of the fistula was performed with the Fogarty catheter with multiple passes performed. Aspiration thrombectomy was performed through both antegrade and retrograde sheaths. Additional angiography was performed through both sheaths. The retrograde sheath was removed and a 2-0 Ethilon pursestring suture applied at the exit site. Additional balloon angioplasty was performed in the dialysis access as well as across the venous anastomosis as well as Angiojet  thrombectomy. Antegrade sheath was upsized to 7 [place]. A Cleaner rotational thrombectomy device was then advanced into the outflow vein beyond the venous anastomosis and used in a retrograde fashion back into the dialysis access. Additional angiography  was performed. Additional balloon angioplasty was performed across the venous anastomosis with an 8 mm x 4 cm Mustang balloon. Completion angiography was performed through the antegrade sheath. The antegrade sheath was removed and a 2-0 Ethilon pursestring suture applied at the exit site. FINDINGS: There is recurrent tight stenosis at the venous anastomosis between the interposition graft and axillary vein of roughly 90% caliber. This was treated with 7 mm balloon angioplasty. After initial thrombectomy and retrograde Fogarty thrombectomy, arterial inflow was reestablished into the graft. At the level of the arterial anastomosis, mild irregular narrowing remains which is stable compared to completion imaging from the prior declot procedure in September and repeat arterial anastomotic angioplasty was not necessary. After additional mechanical thrombectomy, areas of intragraft stenosis were successfully treated with 7 mm balloon angioplasty. Additional rotational mechanical thrombectomy was also utilized to treat nonocclusive residual thrombus in the immediate outflow segment of the axillary vein and within the dialysis access. Residual stenosis of the venous anastomosis was treated with 8 mm balloon angioplasty resulting in improved appearance and no significant residual stenosis remaining on completion. IMPRESSION: Successful thrombectomy and angioplasty to reestablish flow in left upper arm dialysis access consisting of initial fistula segment and an interposition graft to the axillary vein. Recurrent stenosis of the venous anastomosis was treated with both 7 mm and 8 mm balloon angioplasty. Arterial anastomosis remained patent following prior balloon angioplasty  with mild irregular stenosis present but no need for repeat angioplasty. ACCESS: This access remains amenable to future percutaneous interventions as clinically indicated. Electronically Signed   By: Aletta Edouard M.D.   On: 03/22/2022 13:14   IR US Guide Vasc Access Left  Result Date: 03/22/2022 INDICATION: Occluded left upper arm dialysis access which is an original left brachial artery to basilic vein fistula which was revised with transposition and placement of an interposition 7 mm graft to the axillary vein in March. Status post prior access thrombectomy and angioplasty on 02/02/2022. The access has now become re-occluded. EXAM: 1. ULTRASOUND GUIDANCE FOR VASCULAR ACCESS OF LEFT UPPER ARM DIALYSIS ACCESS 2. LEFT UPPER ARM DIALYSIS ACCESS THROMBECTOMY INCLUDING MECHANICAL THROMBECTOMY AND ANGIOPLASTY OF THE DIALYSIS GRAFT AND VENOUS ANASTOMOSIS MEDICATIONS: 1 mg tPA. 4000 units intravenous heparin administered during the procedure. ANESTHESIA/SEDATION: Moderate (conscious) sedation was employed during this procedure. A total of Versed 2.0 mg and Fentanyl 150 mcg was administered intravenously. Moderate Sedation Time: 120 minutes. The patient's level of consciousness and vital signs were monitored continuously by radiology nursing throughout the procedure under my direct supervision. FLUOROSCOPY TIME:  Fluoroscopy Time: 15 minutes and 30 seconds. 69.0 mGy. COMPLICATIONS: None immediate. PROCEDURE: Informed written consent was obtained from the patient after a thorough discussion of the procedural risks, benefits and alternatives. All questions were addressed. Maximal Sterile Barrier Technique was utilized including caps, mask, sterile gowns, sterile gloves, sterile drape, hand hygiene and skin antiseptic. During the procedure local anesthesia was provided with 1% lidocaine. A timeout was performed prior to the initiation of the procedure. Antegrade access of the left upper arm dialysis fistula/graft was  performed under direct ultrasound guidance with a micropuncture set. 1 mg of tPA was administered within the dialysis access via the a micropuncture dilator. A 6 French sheath was initially placed in an antegrade direction over a guidewire. A 5 French catheter was advanced over a guidewire to the level of the venous anastomosis. The venous anastomosis was crossed with a hydrophilic guidewire allowing catheter access into patent axillary vein. Venography was performed through the catheter  to assess patency of central veins. The catheter was withdrawn to assess patency of the venous anastomosis. Mechanical thrombectomy was performed in an antegrade direction through dialysis access to the level of the venous anastomosis with a 6 [place] Angiojet catheter. Balloon angioplasty was then performed across the venous anastomosis with a 7 mm x 4 cm Conquest balloon with multiple inflations across the venous anastomosis and extending into the distal aspect of the interposition graft. Retrograde access of the dialysis graft was then performed with a micropuncture set under direct ultrasound guidance. A 6 French sheath was placed. A 4 French Fogarty balloon catheter was then advanced under ultrasound and fluoroscopic guidance across the arterial anastomosis of the fistula portion of the access. Thrombectomy across the arterial anastomosis and arterial segment of the fistula was performed with the Fogarty catheter with multiple passes performed. Aspiration thrombectomy was performed through both antegrade and retrograde sheaths. Additional angiography was performed through both sheaths. The retrograde sheath was removed and a 2-0 Ethilon pursestring suture applied at the exit site. Additional balloon angioplasty was performed in the dialysis access as well as across the venous anastomosis as well as Angiojet thrombectomy. Antegrade sheath was upsized to 7 [place]. A Cleaner rotational thrombectomy device was then advanced into the  outflow vein beyond the venous anastomosis and used in a retrograde fashion back into the dialysis access. Additional angiography was performed. Additional balloon angioplasty was performed across the venous anastomosis with an 8 mm x 4 cm Mustang balloon. Completion angiography was performed through the antegrade sheath. The antegrade sheath was removed and a 2-0 Ethilon pursestring suture applied at the exit site. FINDINGS: There is recurrent tight stenosis at the venous anastomosis between the interposition graft and axillary vein of roughly 90% caliber. This was treated with 7 mm balloon angioplasty. After initial thrombectomy and retrograde Fogarty thrombectomy, arterial inflow was reestablished into the graft. At the level of the arterial anastomosis, mild irregular narrowing remains which is stable compared to completion imaging from the prior declot procedure in September and repeat arterial anastomotic angioplasty was not necessary. After additional mechanical thrombectomy, areas of intragraft stenosis were successfully treated with 7 mm balloon angioplasty. Additional rotational mechanical thrombectomy was also utilized to treat nonocclusive residual thrombus in the immediate outflow segment of the axillary vein and within the dialysis access. Residual stenosis of the venous anastomosis was treated with 8 mm balloon angioplasty resulting in improved appearance and no significant residual stenosis remaining on completion. IMPRESSION: Successful thrombectomy and angioplasty to reestablish flow in left upper arm dialysis access consisting of initial fistula segment and an interposition graft to the axillary vein. Recurrent stenosis of the venous anastomosis was treated with both 7 mm and 8 mm balloon angioplasty. Arterial anastomosis remained patent following prior balloon angioplasty with mild irregular stenosis present but no need for repeat angioplasty. ACCESS: This access remains amenable to future  percutaneous interventions as clinically indicated. Electronically Signed   By: Aletta Edouard M.D.   On: 03/22/2022 13:14    Labs:  CBC: Recent Labs    05/14/21 0611 06/14/21 0626 07/24/21 0812 08/20/21 0937 01/31/22 1230 04/18/22 1014  WBC 11.5*  --  9.2  --  11.6* 8.9  HGB 11.9*   < > 13.2 18.4* 13.6 13.8  HCT 36.6*   < > 41.8 54.0* 42.0 42.4  PLT 346  --  193  --  302 269   < > = values in this interval not displayed.    COAGS: Recent Labs  05/06/21 1600  INR 1.3*  APTT 34    BMP: Recent Labs    07/24/21 0812 08/20/21 0937 01/31/22 1230 03/22/22 0744 04/18/22 1014  NA 138 137 139 139 139  K 4.0 4.7 4.6 4.3 5.0  CL 96* 99 97* 99 102  CO2 23  --  24 23 17*  GLUCOSE 101* 114* 99 121* 96  BUN 54* 47* 61* 51* 81*  CALCIUM 8.4*  --  8.9 8.3* 8.3*  CREATININE 16.43* 14.70* 19.13* 19.37* 25.22*  GFRNONAA 4*  --  3* 3* 2*    LIVER FUNCTION TESTS: Recent Labs    05/06/21 1600 05/11/21 0824 05/14/21 0611 04/18/22 1014  BILITOT 0.7  --   --  0.7  AST 21  --   --  12*  ALT 12  --   --  13  ALKPHOS 69  --   --  51  PROT 9.8*  --   --  7.6  ALBUMIN 4.3 2.9* 3.3* 4.1    TUMOR MARKERS: No results for input(s): "AFPTM", "CEA", "CA199", "CHROMGRNA" in the last 8760 hours.  Assessment and Plan: Clotted left upper arm fistula s/p unsuccessful attempt to re-cannulate occluded segment 04/15/22 Patient returns to [facility] ED to establish HD access.  He is agreeable to placement of tunneled HD cath and has been consented for same.  He will be NPO p MN tonight for planned placement tomorrow as schedule allows.   He does request a second review of his AVF/G to see if access can be restored as he has had multiple failed AVFs in the past.  Case reviewed by Dr. Maryelizabeth Kaufmann who has reviewed procedural imaging from most recent attempt and agrees with recommendation to proceed with surgical evaluation.   Risks and benefits discussed with the patient including, but not limited to  bleeding, infection, vascular injury, pneumothorax which may require chest tube placement, air embolism or even death  All of the patient's questions were answered, patient is agreeable to proceed. Consent signed and in chart.   Thank you for this interesting consult.  I greatly enjoyed meeting Renner Sebald and look forward to participating in their care.  A copy of this report was sent to the requesting provider on this date.  Electronically Signed: Docia Barrier, PA 04/18/2022, 4:04 PM   I spent a total of 20 Minutes    in face to face in clinical consultation, greater than 50% of which was counseling/coordinating care for clotted AV fistula/graft

## 2022-04-18 NOTE — ED Notes (Addendum)
Pt states that he has dialysis Tuesday, Thurs and Saturday normally.  Pt states that his access has "clotted off" and they will not dialyze him.  Fistula in upper left arm.

## 2022-04-18 NOTE — H&P (Addendum)
Hospital Admission History and Physical Service Pager: [phone]  Patient name: Paul [name] Medical record number: [id number] Date of Birth: 12/20/1990 Age: 31 y.o. Gender: male  Primary Care Provider: Orvis Brill, DO Consultants: IR, nephrology Code Status: FULL Preferred Emergency Contact: Paul [name], mother, [phone]  Chief Complaint: Hemodialysis access issue  Assessment and Plan: Paul [name] is a 31 y.o. male presenting with needing dialysis with complications including blood clotting in AV fistula.   * Failure of hemodialysis access Paul [name]) Typically receives dialysis Tuesday, Thursday, Saturday every week.  Last dialysis was 11/22.  Patient was scheduled for an appointment 11/25 but he was unable to obtain dialysis due to blood clotting and AV fistula.  Was told to come to the hospital to receive dialysis. 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  - Admit to FMTS, MedSurg, attending Dr. Ardelia [name] - Nephrology consulted, appreciate recommendations - IR consulted, appreciate recommendations - continue home eliquis  - Continue cinacalcet, doxercalciferol  - Vitals per routine - Up with assistance  Hypertension On admission, blood pressure was 136/108. - Continue to monitor - Restart home medications as appropriate after med reconciliation completed  Type 2 diabetes mellitus (Paul [name]) Last A1c in 2022 was 6.0 -- CBG monitoring -- Very sensitive SSI   Chronic medical conditions Hyperlipidemia-- continue statin Hypothyroidism- continue Synthroid Neuropathy- hold gabapentin until med rec completed   FEN/GI: Renal diet/carb modified VTE Prophylaxis: Eliquis  Disposition: MedSurg  History of Present Illness:  Paul [name] is a 31 y.o. male presenting with AV fistula access for hemodialysis.  Patient typically receives dialysis TTS every week.  Last dialysis was Wednesday 11/22.  He was supposed to receive dialysis on 11/25  but was unable to obtain dialysis due to blood clotting and AV fistula.  Patient was told to go to ED to receive dialysis.  Patient noted shortness of breath, chills, and weight gain recently.   Review Of Systems: Per HPI with the following additions: Denies fever, chest pain, urinal issues, cough  Pertinent Past Medical History: ESRD, type 2 diabetes, GERD, hyperlipidemia, hypertension, hypothyroidism, morbid obesity  Pertinent Past Surgical History: Toe amputation, AV fistula placement, thrombectomy Remainder reviewed in history tab.   Pertinent Social History: Tobacco use: No Alcohol use: Denies Other Substance use: Marijuana Lives with mother  Pertinent Family History: Heart disease, hypertension  Remainder reviewed in history tab.   Important Outpatient Medications: Zyrtec, cinacalcet, Eliquis, ferric citrate, gabapentin, Lantus, Synthroid, midodrine, multivitamin, Crestor Remainder reviewed in medication history.   Objective: BP (!) 136/108 (BP Location: Right Arm)   Pulse 77   Temp 97.7 F (36.5 C)   Resp 18   Ht 6\' 2"  (1.88 m)   Wt (!) 147.4 kg   SpO2 99%   BMI 41.73 kg/m  Exam: General: Well-appearing, obese male.  No acute distress  Cardiovascular: RRR.  No murmurs/rubs/gallops appreciated Respiratory: CTAB.  Normal effort, on room air Gastrointestinal: Soft, nontender, obese abdomen.  Normal bowel sounds Neuro: A&O x4.  No focal deficit.  No asterixis  Labs:  CBC BMET  Recent Labs  Lab 04/18/22 1014  WBC 8.9  HGB 13.8  HCT 42.4  PLT 269   Recent Labs  Lab 04/18/22 1014  NA 139  K 5.0  CL 102  CO2 17*  BUN 81*  CREATININE 25.22*  GLUCOSE 96  CALCIUM 8.3*       EKG: Normal sinus rhythm, QTc 440   Imaging Studies Performed:  Chest x-ray no  active cardiopulmonary process   Paul Morin, MD 04/18/2022, 4:55 PM PGY-1, [facility]  Upper Level Addendum I was personally present and performed or re-performed the  history, physical exam and medical decision making activities of this service and have verified that the service and findings are accurately documented in Dr. Vernie [name] note. My edits are noted within the note above. Please also see attending's attestation.   Paul Dice, DO                  04/18/2022, 5:05 PM  PGY-3, [place] Intern pager: [phone], text pages welcome Secure chat group [place]

## 2022-04-18 NOTE — Assessment & Plan Note (Addendum)
Typically receives dialysis Tuesday, Thursday, Saturday every week.  Last dialysis was 11/22.  Patient was scheduled for an appointment 11/25 but he was unable to obtain dialysis due to blood clotting and AV fistula.  Was told to come to the hospital to receive dialysis. 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  - Admit to FMTS, MedSurg, attending Dr. Ardelia Mems - Nephrology consulted, appreciate recommendations - IR consulted, appreciate recommendations - continue home eliquis  - Continue cinacalcet, doxercalciferol  - Vitals per routine - Up with assistance

## 2022-04-18 NOTE — ED Provider Notes (Signed)
Received sign off from Paul [name], pending admission to family medicine. IR and nephrology have both been consulted. IR to work on vascular access.   Physical Exam  BP (!) 123/91   Pulse 67   Temp 97.8 F (36.6 C)   Resp 17   Ht 6\' 2"  (1.88 m)   Wt (!) 147.4 kg   SpO2 99%   BMI 41.73 kg/m   Physical Exam Vitals and nursing note reviewed.  Constitutional:      General: He is not in acute distress.    Appearance: He is well-developed.  HENT:     Head: Normocephalic and atraumatic.  Eyes:     Conjunctiva/sclera: Conjunctivae normal.  Cardiovascular:     Rate and Rhythm: Normal rate and regular rhythm.     Heart sounds: No murmur heard.    Comments: LUE AV fistula w/minimal thrills or bruit Pulmonary:     Effort: Pulmonary effort is normal. No respiratory distress.     Breath sounds: Normal breath sounds.  Abdominal:     Palpations: Abdomen is soft.     Tenderness: There is no abdominal tenderness.  Musculoskeletal:        General: No swelling.     Cervical back: Neck supple.  Skin:    General: Skin is warm and dry.     Capillary Refill: Capillary refill takes less than 2 seconds.  Neurological:     Mental Status: He is alert.  Psychiatric:        Mood and Affect: Mood normal.     Procedures  Procedures  ED Course / MDM    Medical Decision Making  Spoke with family medicine, admitted to family medicine. IR and nephrology consulted by B. Rona Ravens PA.       Osvaldo Shipper, [place] 04/18/22 1549    Davonna Belling, MD 04/18/22 [phone]

## 2022-04-18 NOTE — Assessment & Plan Note (Signed)
Last A1c in 2022 was 6.0.  CBGs ranged from 90s-100s.  Has not needed any short acting insulin. -- CBG monitoring -- Very sensitive SSI

## 2022-04-18 NOTE — Hospital Course (Signed)
Paul [name] is a 31 y.o. male patient with PMH significant for ESRD on HD, T2DM, GERD, HLD, HTN, hypothyroidism, and morbid obesity admitted for complications with dialysis d/t blood clots in AV fistula.  Failure of HD access Patient receives dialysis Tu/Th/Sat weekly, last received on 11/22. Has been on long-term coagulation for history of dialysis clot since Dec 2022. Had a tunneled HD catheter placement today.

## 2022-04-18 NOTE — ED Provider Notes (Signed)
[facility] EMERGENCY DEPARTMENT Provider Note   CSN: [id number] Arrival date & time: 04/18/22  0841     History  Chief Complaint  Patient presents with   Vascular Access Problem    Paul [name] is a 31 y.o. male.  The history is provided by the patient and medical records. No language interpreter was used.     Patient is a 31 year old male presenting to ED with CC need dialysis. Patient typically receives dialysis Tuesday, Thursday, Saturday every week. Last dialysis was Wednesday 11/22. Patient had appointment for Saturday 11/25, but clinicians were unable to perform dialysis due to blood clotting in AV fistula. Patient told to go to ED to receive dialysis. Patient reports mild SOB and unintentional weight gain of approximately 4lbs since last treatment. Patient denies abdominal pain, pelvic pain, urinary changes, cough, fever.  Home Medications Prior to Admission medications   Medication Sig Start Date End Date Taking? Authorizing Provider  Accu-Chek Softclix Lancets lancets Use as instructed 06/11/21   Eppie Gibson, MD  acetaminophen (TYLENOL) 500 MG tablet Take 1,000 mg by mouth every 6 (six) hours as needed (for pain.).    [provider]  Blood Glucose Monitoring Suppl (ACCU-CHEK GUIDE ME) w/Device KIT 1 kit by Does not apply route daily. 05/22/21   Eppie Gibson, MD  calcium carbonate (TUMS - DOSED IN MG ELEMENTAL CALCIUM) 500 MG chewable tablet Chew 1,000 mg by mouth daily as needed for indigestion or heartburn.    [provider]  cetirizine (ZYRTEC) 10 MG tablet Take 10 mg by mouth daily as needed for allergies.    [provider]  cinacalcet (SENSIPAR) 30 MG tablet Take 30 mg by mouth daily. 08/12/21   [provider]  ELIQUIS 5 MG TABS tablet Take 1 tablet by mouth twice daily 04/13/22   Dameron, Luna Fuse, DO  ferric citrate (AURYXIA) 1 GM 210 MG(Fe) tablet Take 420-840 mg by mouth See admin instructions. Take 4  tablets (840 mg) by mouth with each meal & take 2 tablets (420 mg) by mouth with each snack.    [provider]  gabapentin (NEURONTIN) 100 MG capsule TAKE 1 CAPSULE BY MOUTH THREE TIMES DAILY 04/11/22   Waynetta Sandy, MD  glucose blood (ACCU-CHEK GUIDE) test strip 1 each by Other route 2 (two) times daily. Use as instructed 09/22/21   Wells Guiles, DO  LANTUS 100 UNIT/ML injection Inject 0.05 mLs (5 Units total) into the skin 2 (two) times daily. 03/11/22   Dameron, Luna Fuse, DO  levothyroxine (SYNTHROID) 150 MCG tablet TAKE 1 TABLET BY MOUTH ONCE DAILY BEFORE BREAKFAST 04/18/22   Dameron, Luna Fuse, DO  midodrine (PROAMATINE) 5 MG tablet Take 1 tablet (5 mg total) by mouth 3 (three) times daily with meals. Patient taking differently: Take 5 mg by mouth Every Tuesday,Thursday,and Saturday with dialysis. 05/14/21   Bonnielee Haff, MD  multivitamin (RENA-VIT) TABS tablet Take 1 tablet by mouth at bedtime. 10/25/16   Nicolette Bang, MD  rosuvastatin (CRESTOR) 20 MG tablet Take 1 tablet by mouth once daily 01/08/22   Dameron, Luna Fuse, DO  Syringe, Disposable, (B-D SYRINGE SLIP TIP 30CC) 30 ML MISC 1 Syringe by Does not apply route daily. 01/31/19   Wilber Oliphant, MD      Allergies    Hydralazine hcl    Review of Systems   Review of Systems  All other systems reviewed and are negative.   Physical Exam Updated Vital Signs BP (!) 123/91  Pulse 67   Temp 97.8 F (36.6 C)   Resp 17   Ht _0  (1.88 m)   Wt (!) 147.4 kg   SpO2 99%   BMI 41.73 kg/m  Physical Exam Vitals and nursing note reviewed.  Constitutional:      General: He is not in acute distress.    Appearance: He is well-developed. He is obese.  HENT:     Head: Atraumatic.  Eyes:     Conjunctiva/sclera: Conjunctivae normal.  Cardiovascular:     Rate and Rhythm: Normal rate and regular rhythm.     Pulses: Normal pulses.     Heart sounds: Normal heart sounds.  Pulmonary:     Effort: Pulmonary effort  is normal.     Breath sounds: Normal breath sounds.  Abdominal:     Palpations: Abdomen is soft.  Musculoskeletal:     Cervical back: Neck supple.  Skin:    Findings: No rash.     Comments: Left upper arm with AV fistula that has minimal thrills or bruit.  No overlying skin changes.  1 suture were noted in place.  Neurological:     Mental Status: He is alert.     ED Results / Procedures / Treatments   Labs (all labs ordered are listed, but only abnormal results are displayed) Labs Reviewed  CBC WITH DIFFERENTIAL/PLATELET - Abnormal; Notable for the following components:      Result Value   RDW 17.5 (*)    All other components within normal limits  BRAIN NATRIURETIC PEPTIDE - Abnormal; Notable for the following components:   B Natriuretic Peptide 311.0 (*)    All other components within normal limits  COMPREHENSIVE METABOLIC PANEL - Abnormal; Notable for the following components:   CO2 17 (*)    BUN 81 (*)    Creatinine, Ser 25.22 (*)    Calcium 8.3 (*)    AST 12 (*)    GFR, Estimated 2 (*)    Anion gap 20 (*)    All other components within normal limits  CBG MONITORING, ED - Abnormal; Notable for the following components:   Glucose-Capillary 104 (*)    All other components within normal limits    EKG None  Radiology DG Chest 2 View  Result Date: 04/18/2022 CLINICAL DATA:  Shortness of breath for 1-2 days. History of diabetes and end-stage renal disease. EXAM: CHEST - 2 VIEW COMPARISON:  Radiographs 05/06/2021 and 03/22/2018. FINDINGS: Hemodialysis catheter has been removed. A vascular stent is noted within the right axilla. The heart size and mediastinal contours are normal. The lungs are clear. There is no pleural effusion or pneumothorax. No acute osseous findings are identified. IMPRESSION: No active cardiopulmonary process. Electronically Signed   By: Richardean Sale M.D.   On: 04/18/2022 10:38    Procedures Procedures    Medications Ordered in ED Medications  - No data to display  ED Course/ Medical Decision Making/ A&P                           Medical Decision Making  BP (!) 123/91   Pulse 67   Temp 97.8 F (36.6 C)   Resp 17   Ht _1  (1.88 m)   Wt (!) 147.4 kg   SpO2 99%   BMI 41.73 kg/m   107:75 PM 31 year old male with significant history of end-stage renal disease currently on Tuesday Thursday Saturday dialysis schedule presenting with concerns of clotted AV  fistula.  Patient was last dialyzed 5 days ago.  He was supposed to get dialysis 2 days ago but his AV fistula was clotted.  IR specialist did attempt to recannulate the AV fistula with successful left upper arm AV graft thrombolysis.  Patient is here requesting for dialysis.  Patient overall denies having any increasing pain to his left arm.  He denies having fever chills or increasing cough.  No shortness of breath no other complaint.  He admits he is gaining some extra weight after Thanksgiving meal and not been dialyzed.  On exam this is an obese male laying in bed appears to be in no acute discomfort.  Heart lung sounds normal lungs clear on auscultation.  Left AV fistula with decreased thrills and bruits but no overlying skin changes.  1 suture noted in place.  2:57 PM I have reached out to interventional radiologist and spoke with Dr. Maryelizabeth Kaufmann who is agreeable to perform vascular access in order for this patient to get dialysis.  I also have consulted with on-call nephrologist, Dr. Melvia Heaps to will also be involved in patient care.  Will consult medicine for admission.  -Labs ordered, independently viewed and interpreted by me.  Labs remarkable for normal potassium level.  Impaired renal function consistent with ESRD.  Mildly elevated BNP of 311 in the setting of kidney failure.  -The patient was maintained on a cardiac monitor.  I personally viewed and interpreted the cardiac monitored which showed an underlying rhythm of: NSR -Imaging independently viewed and interpreted by me  and I agree with radiologist's interpretation.  Result remarkable for CXR showing no acute changes -This patient presents to the ED for concern of AV fistula complication, this involves an extensive number of treatment options, and is a complaint that carries with it a high risk of complications and morbidity.  The differential diagnosis includes clotted AVF, electrolytes derangement, cellulitis, abscess -Co morbidities that complicate the patient evaluation includes ESRD -Treatment includes IR vascular access -Reevaluation of the patient after these medicines showed that the patient stayed the same -PCP office notes or outside notes reviewed -Discussion with specialist including IR specialist, Nephrologist, and [facility] -Escalation to admission/observation considered: patients feels comfortable with being admitted for vascular access and dialysis  3:11 PM Patient signed out to oncoming provider who will consult medicine for admission.  At this time patient is medically stable with normal potassium and normal chest x-ray.  He will need vascular access from IR as well as dialysis from nephrology.         Final Clinical Impression(s) / ED Diagnoses Final diagnoses:  ESRD (end stage renal disease) ([place])  Complication of vascular access for dialysis, subsequent encounter    Rx / DC Orders ED Discharge Orders     None         Domenic Moras, PA-C 04/18/22 1512    Godfrey Pick, MD 04/18/22 [phone]

## 2022-04-18 NOTE — ED Triage Notes (Signed)
Pt arrived POV from home c/o a vascular access issue. Pt states he usually gets dialysis T,TH,S but they switched his schedule last week for the holiday. Pt states his last treatment was Wednesday. When he went on Saturday they were unable to access his graft and they told him to come here.

## 2022-04-18 NOTE — ED Notes (Signed)
Pt refused EKG.

## 2022-04-18 NOTE — Assessment & Plan Note (Signed)
On admission, blood pressure was 136/108. - Continue to monitor - Restart home medications as appropriate after med reconciliation completed

## 2022-04-18 NOTE — Consult Note (Signed)
Renal Service Consult Note [facility]  Paul [name] 04/18/2022 [name], MD Requesting Physician: Dr. Doren Custard  Reason for Consult: ESRD pt w/ clotted access HPI: The patient is a 31 y.o. year-old w/ PMH as below who presented to ED w/ clotted AV graft.  His last OP HD was Wed off schedule. On Sat his graft was found to be clotted.  He called [facility] this am and they didn't have any open appts soon so he came to ED.  He has worked w/ IR here before. We are asked to see pt for HD.    Pt seen in ED. On HD x 5 years, has had TDC's and has had clotted access in the past as well. Last HD as above. +orthopnea, no SOB at rest.    ROS - denies CP, no joint pain, no HA, no blurry vision, no rash, no diarrhea, no nausea/ vomiting, no dysuria, no difficulty voiding   Past Medical History  Past Medical History:  Diagnosis Date   Acute hypoxemic respiratory failure ([place]) 09/11/2016   Anemia    Chronic kidney disease    ARF on CRF Dialysis T/TH/Sa   Diabetes mellitus    Type II   End stage renal disease on dialysis ([place])    [place]    GERD (gastroesophageal reflux disease)    diet controlled   HLD (hyperlipidemia)    Hypertension    Hypothyroidism    Morbid obesity ([place])    Sacral wound    resolved   Thyroid disease    Past Surgical History  Past Surgical History:  Procedure Laterality Date   AMPUTATION Left 05/08/2021   Procedure: AMPUTATION GREAT TOE;  Surgeon: Newt Minion, MD;  Location: [place];  Service: Orthopedics;  Laterality: Left;   AV FISTULA PLACEMENT Left 09/26/2016   Procedure: LEFT UPPER ARM ARTERIOVENOUS (AV) FISTULA CREATION;  Surgeon: Elam Dutch, MD;  Location: [place];  Service: Vascular;  Laterality: Left;   AV FISTULA PLACEMENT Left 12/02/2016   Procedure: INSERTION OF ARTERIOVENOUS GORE-TEX GRAFT LEFT UPPER  ARM USING A 4-7MM BY 45CM GRAFT ;  Surgeon: Rosetta Posner, MD;  Location: [place];  Service: Vascular;  Laterality: Left;    AV FISTULA PLACEMENT Right 07/03/2020   Procedure: INSERTION OF ARTERIOVENOUS (AV) GORE-TEX GRAFT RIGHT UPPER ARM;  Surgeon: Marty Heck, MD;  Location: [place];  Service: Vascular;  Laterality: Right;   AV FISTULA PLACEMENT Left 07/02/2021   Procedure: LEFT UPPER EXTREMITY ARTERIOVENOUS FISTULA CREATION;  Surgeon: Waynetta Sandy, MD;  Location: [place];  Service: Vascular;  Laterality: Left;   [place] Right 05/06/2020   Procedure: FIRST STAGE RIGHT [place];  Surgeon: Serafina Mitchell, MD;  Location: [place];  Service: Vascular;  Laterality: Right;   [place] Left 08/20/2021   Procedure: LEFT ARM SECOND STAGE WITH ARTERIOVENOUS GRAFT;  Surgeon: Waynetta Sandy, MD;  Location: [place];  Service: Vascular;  Laterality: Left;   EXCHANGE OF A DIALYSIS CATHETER Right 10/08/2016   Procedure: EXCHANGE OF A DIALYSIS CATHETER-RIGHT INTERNAL JUGULAR;  Surgeon: Conrad Caney, MD;  Location: [place];  Service: Vascular;  Laterality: Right;   INSERTION OF DIALYSIS CATHETER Right 09/26/2016   Procedure: INSERTION OF DIALYSIS CATHETER - Right Internal Jugular Placement;  Surgeon: Elam Dutch, MD;  Location: [place];  Service: Vascular;  Laterality: Right;   INSERTION OF DIALYSIS CATHETER Left 03/22/2018   Procedure: INSERTION OF DIALYSIS  CATHETER;  Surgeon: Marty Heck, MD;  Location: [place];  Service: Vascular;  Laterality: Left;   IR DIALY SHUNT INTRO NEEDLE/INTRACATH INITIAL W/IMG LEFT Left 04/15/2022   IR FLUORO GUIDE CV LINE RIGHT  03/26/2021   IR FLUORO GUIDE CV LINE RIGHT  05/13/2021   IR REMOVAL TUN CV CATH W/O FL  05/11/2021   IR REMOVAL TUN CV CATH W/O FL  10/06/2021   IR THROMBECTOMY AV FISTULA W/THROMBOLYSIS/PTA INC/SHUNT/IMG LEFT Left 02/02/2022   IR THROMBECTOMY AV FISTULA W/THROMBOLYSIS/PTA INC/SHUNT/IMG LEFT Left 03/22/2022   IR US GUIDE VASC ACCESS LEFT  02/02/2022   IR US GUIDE VASC ACCESS LEFT  03/22/2022   IR  US GUIDE VASC ACCESS LEFT  04/15/2022   IR US GUIDE VASC ACCESS RIGHT  03/26/2021   IR US GUIDE VASC ACCESS RIGHT  05/13/2021   REVISON OF ARTERIOVENOUS FISTULA Left 03/22/2018   Procedure: LEFT UPPER EXTREMITY ARTERIOVENOUS  REVISION WITH GORE-TEX GRAFT.;  Surgeon: Marty Heck, MD;  Location: [place];  Service: Vascular;  Laterality: Left;   TEE WITHOUT CARDIOVERSION N/A 05/12/2021   Procedure: TRANSESOPHAGEAL ECHOCARDIOGRAM (TEE);  Surgeon: Werner Lean, MD;  Location: [facility];  Service: Cardiovascular;  Laterality: N/A;   THROMBECTOMY Left 08/26/2019   thrombectomy of LUA loop AVG   THROMBECTOMY AND REVISION OF ARTERIOVENTOUS (AV) GORETEX  GRAFT Right 07/27/2020   Procedure: INSERTION OF RIGHT ARM LOOP GRAFT WITH EXCISION OF RIGHT ARM BRACHIAL AXILLARY GRAFT;  Surgeon: Marty Heck, MD;  Location: [place];  Service: Vascular;  Laterality: Right;   UPPER EXTREMITY VENOGRAPHY Bilateral 06/14/2021   Procedure: UPPER EXTREMITY VENOGRAPHY;  Surgeon: Waynetta Sandy, MD;  Location: [place] [facility];  Service: Cardiovascular;  Laterality: Bilateral;   VISCERAL ANGIOGRAPHY Right 06/17/2020   Procedure: CENTRAL VENO;  Surgeon: Cherre Robins, MD;  Location: [place] [facility];  Service: Cardiovascular;  Laterality: Right;   Family History  Family History  Problem Relation Age of Onset   Heart disease Mother    Hypertension Mother    Social History  reports that he has never smoked. He has never used smokeless tobacco. He reports current drug use. Frequency: 2.00 times per week. Drug: Marijuana. He reports that he does not drink alcohol. Allergies  Allergies  Allergen Reactions   Hydralazine Hcl Other (See Comments)    DRUG-INDUCED LUPUS   Home medications Prior to Admission medications   Medication Sig Start Date End Date Taking? Authorizing Provider  Accu-Chek Softclix Lancets lancets Use as instructed 06/11/21   Eppie Gibson, MD  acetaminophen  (TYLENOL) 500 MG tablet Take 1,000 mg by mouth every 6 (six) hours as needed (for pain.).    [provider]  Blood Glucose Monitoring Suppl (ACCU-CHEK GUIDE ME) w/Device KIT 1 kit by Does not apply route daily. 05/22/21   Eppie Gibson, MD  calcium carbonate (TUMS - DOSED IN MG ELEMENTAL CALCIUM) 500 MG chewable tablet Chew 1,000 mg by mouth daily as needed for indigestion or heartburn.    [provider]  cetirizine (ZYRTEC) 10 MG tablet Take 10 mg by mouth daily as needed for allergies.    [provider]  cinacalcet (SENSIPAR) 30 MG tablet Take 30 mg by mouth daily. 08/12/21   [provider]  ELIQUIS 5 MG TABS tablet Take 1 tablet by mouth twice daily 04/13/22   Dameron, Luna Fuse, DO  ferric citrate (AURYXIA) 1 GM 210 MG(Fe) tablet Take 420-840 mg by mouth See admin instructions. Take  4 tablets (840 mg) by mouth with each meal & take 2 tablets (420 mg) by mouth with each snack.    [provider]  gabapentin (NEURONTIN) 100 MG capsule TAKE 1 CAPSULE BY MOUTH THREE TIMES DAILY 04/11/22   Waynetta Sandy, MD  glucose blood (ACCU-CHEK GUIDE) test strip 1 each by Other route 2 (two) times daily. Use as instructed 09/22/21   Wells Guiles, DO  LANTUS 100 UNIT/ML injection Inject 0.05 mLs (5 Units total) into the skin 2 (two) times daily. 03/11/22   Dameron, Luna Fuse, DO  levothyroxine (SYNTHROID) 150 MCG tablet TAKE 1 TABLET BY MOUTH ONCE DAILY BEFORE BREAKFAST 04/18/22   Dameron, Luna Fuse, DO  midodrine (PROAMATINE) 5 MG tablet Take 1 tablet (5 mg total) by mouth 3 (three) times daily with meals. Patient taking differently: Take 5 mg by mouth Every Tuesday,Thursday,and Saturday with dialysis. 05/14/21   Bonnielee Haff, MD  multivitamin (RENA-VIT) TABS tablet Take 1 tablet by mouth at bedtime. 10/25/16   Nicolette Bang, MD  rosuvastatin (CRESTOR) 20 MG tablet Take 1 tablet by mouth once daily 01/08/22   Dameron, Luna Fuse, DO  Syringe,  Disposable, (B-D SYRINGE SLIP TIP 30CC) 30 ML MISC 1 Syringe by Does not apply route daily. 01/31/19   Wilber Oliphant, MD     Vitals:   04/18/22 0848 04/18/22 0854 04/18/22 1148 04/18/22 1230  BP: (!) 138/91  (!) 123/93 (!) 123/91  Pulse: 76  74 67  Resp: _0 Temp: 98.5 F (36.9 C)  98.6 F (37 C) 97.8 F (36.6 C)  TempSrc: Oral     SpO2: 99%  96% 99%  Weight:  (!) 147.4 kg    Height:  _1  (1.88 m)     Exam Gen alert, no distress No rash, cyanosis or gangrene Sclera anicteric, throat clear  No jvd or bruits Chest clear bilat to bases, no rales/ wheezing RRR no MRG Abd soft ntnd no mass or ascites +bs GU normal male MS no joint effusions or deformity Ext trace LE edema, no wounds or ulcers Neuro is alert, Ox 3 , nf    LUA AVG+ no bruit    Home meds: sensipar, auryxia, eliquis, gabapentin, midodrine 5 mg tid, crestor, synthroid, prns/ vits/ supps    [facility]  450/1.5  146.5kg  2/2 bath  LUA AVG (clotted)  Hep 12000+ 6042mdrun - hectorol 2 ug IV tiw  Assessment/ Plan: Clotted AVG - in LUA. IR is consulting. Has long hx of access failures.  ESRD - last HD 5 days ago. Plan HD after access is established.  BP/ vol - moderate vol excess by history, no resp issues currently. Mild LE edema. Max UF w/ next HD here.  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  MBD ckd - CCa in range, add on phos. Cont IV vdra w/ hd and cont binder.       RKelly Splinter MD 04/18/2022, 3:49 PM Recent Labs  Lab 04/18/22 1014  HGB 13.8  ALBUMIN 4.1  CALCIUM 8.3*  CREATININE 25.22*  K 5.0   Inpatient medications:

## 2022-04-18 NOTE — ED Provider Triage Note (Signed)
Emergency Medicine Provider Triage Evaluation Note  Paul [name] , a 31 y.o. male  was evaluated in triage.  Pt complains of vascular access problem shortness of breath.  History of ESRD.  States that he went to his dialysis appointment on Saturday for routine treatment.  If his clinic were unable to access his graft and concern it may be occluded.  Also patient stated he was up 10 kg at that encounter.  Also states he has been more short of breath which ultimately prompted his evaluation today.  Worse with lying down.  He believes it is due to volume overload.  Denies chest pain or calf tenderness.   Review of Systems  Positive: See above Negative: See above  Physical Exam  BP (!) 138/91 (BP Location: Right Wrist)   Pulse 76   Temp 98.5 F (36.9 C) (Oral)   Resp 18   Ht 6\' 2"  (1.88 m)   Wt (!) 147.4 kg   SpO2 99%   BMI 41.73 kg/m  Gen:   Awake, no distress   Resp:  Normal effort  MSK:   Moves extremities without difficulty  Other:    Medical Decision Making  Medically screening exam initiated at 10:03 AM.  Appropriate orders placed.  Paul [name] was informed that the remainder of the evaluation will be completed by another provider, this initial triage assessment does not replace that evaluation, and the importance of remaining in the ED until their evaluation is complete.  Work up initiated   Paul Pho, PA-C 04/18/22 1007

## 2022-04-18 NOTE — Progress Notes (Addendum)
FMTS Brief Progress Note  S:Patient feels well. Denies CP, palpitations. Says he has some mild back pain for sleeping on ED bed. He denies pain at AV fistula site.    O: BP (!) 132/99 (BP Location: Right Wrist)   Pulse 74   Temp 98 F (36.7 C) (Oral)   Resp 18   Ht 6\' 2"  (1.88 m)   Wt (!) 147.4 kg   SpO2 100%   BMI 41.73 kg/m   General: well appearing  CV: RRR, pulses palpable, equal  Resp: No increased work of breathing on room air  Abd: Soft, non tender, non distended  Fistula site: no erythema, edema  A/P: AV fistula malfunction  IR and Nephrology have been consulted. BMP with stable electrolytes. K 5.0.  - Ordering home meds midodrine, gabapentin  - Orders reviewed. Labs for AM ordered, which was adjusted as needed.  - If condition changes, plan includes contacting nephrology overnight.   Lowry Ram, MD 04/18/2022, 9:34 PM PGY-1, [place] Medicine Night Resident  Please page [phone] with questions.

## 2022-04-19 ENCOUNTER — Observation Stay (HOSPITAL_COMMUNITY): Payer: Medicaid Other

## 2022-04-19 DIAGNOSIS — T8249XA Other complication of vascular dialysis catheter, initial encounter: Secondary | ICD-10-CM | POA: Diagnosis not present

## 2022-04-19 DIAGNOSIS — Z79890 Hormone replacement therapy (postmenopausal): Secondary | ICD-10-CM | POA: Diagnosis not present

## 2022-04-19 DIAGNOSIS — T8249XD Other complication of vascular dialysis catheter, subsequent encounter: Secondary | ICD-10-CM | POA: Diagnosis not present

## 2022-04-19 DIAGNOSIS — Z79899 Other long term (current) drug therapy: Secondary | ICD-10-CM | POA: Diagnosis not present

## 2022-04-19 DIAGNOSIS — D631 Anemia in chronic kidney disease: Secondary | ICD-10-CM | POA: Diagnosis not present

## 2022-04-19 DIAGNOSIS — Z8249 Family history of ischemic heart disease and other diseases of the circulatory system: Secondary | ICD-10-CM | POA: Diagnosis not present

## 2022-04-19 DIAGNOSIS — E1122 Type 2 diabetes mellitus with diabetic chronic kidney disease: Secondary | ICD-10-CM | POA: Diagnosis not present

## 2022-04-19 DIAGNOSIS — E8770 Fluid overload, unspecified: Secondary | ICD-10-CM | POA: Diagnosis not present

## 2022-04-19 DIAGNOSIS — K219 Gastro-esophageal reflux disease without esophagitis: Secondary | ICD-10-CM | POA: Diagnosis not present

## 2022-04-19 DIAGNOSIS — Z794 Long term (current) use of insulin: Secondary | ICD-10-CM | POA: Diagnosis not present

## 2022-04-19 DIAGNOSIS — E785 Hyperlipidemia, unspecified: Secondary | ICD-10-CM | POA: Diagnosis not present

## 2022-04-19 DIAGNOSIS — R0602 Shortness of breath: Secondary | ICD-10-CM | POA: Diagnosis not present

## 2022-04-19 DIAGNOSIS — I120 Hypertensive chronic kidney disease with stage 5 chronic kidney disease or end stage renal disease: Secondary | ICD-10-CM | POA: Diagnosis not present

## 2022-04-19 DIAGNOSIS — T82818A Embolism of vascular prosthetic devices, implants and grafts, initial encounter: Secondary | ICD-10-CM | POA: Diagnosis not present

## 2022-04-19 DIAGNOSIS — E875 Hyperkalemia: Secondary | ICD-10-CM | POA: Diagnosis not present

## 2022-04-19 DIAGNOSIS — Z7901 Long term (current) use of anticoagulants: Secondary | ICD-10-CM | POA: Diagnosis not present

## 2022-04-19 DIAGNOSIS — N189 Chronic kidney disease, unspecified: Secondary | ICD-10-CM | POA: Diagnosis not present

## 2022-04-19 DIAGNOSIS — E039 Hypothyroidism, unspecified: Secondary | ICD-10-CM | POA: Diagnosis not present

## 2022-04-19 DIAGNOSIS — N186 End stage renal disease: Secondary | ICD-10-CM | POA: Diagnosis not present

## 2022-04-19 DIAGNOSIS — Y712 Prosthetic and other implants, materials and accessory cardiovascular devices associated with adverse incidents: Secondary | ICD-10-CM | POA: Diagnosis present

## 2022-04-19 DIAGNOSIS — Y832 Surgical operation with anastomosis, bypass or graft as the cause of abnormal reaction of the patient, or of later complication, without mention of misadventure at the time of the procedure: Secondary | ICD-10-CM | POA: Diagnosis present

## 2022-04-19 DIAGNOSIS — Z6841 Body Mass Index (BMI) 40.0 and over, adult: Secondary | ICD-10-CM | POA: Diagnosis not present

## 2022-04-19 DIAGNOSIS — T82510A Breakdown (mechanical) of surgically created arteriovenous fistula, initial encounter: Secondary | ICD-10-CM | POA: Diagnosis not present

## 2022-04-19 DIAGNOSIS — T829XXA Unspecified complication of cardiac and vascular prosthetic device, implant and graft, initial encounter: Secondary | ICD-10-CM

## 2022-04-19 DIAGNOSIS — Z89412 Acquired absence of left great toe: Secondary | ICD-10-CM | POA: Diagnosis not present

## 2022-04-19 DIAGNOSIS — Z4901 Encounter for fitting and adjustment of extracorporeal dialysis catheter: Secondary | ICD-10-CM | POA: Diagnosis not present

## 2022-04-19 DIAGNOSIS — Z992 Dependence on renal dialysis: Secondary | ICD-10-CM | POA: Diagnosis not present

## 2022-04-19 DIAGNOSIS — E1142 Type 2 diabetes mellitus with diabetic polyneuropathy: Secondary | ICD-10-CM | POA: Diagnosis not present

## 2022-04-19 HISTORY — PX: IR FLUORO GUIDE CV LINE LEFT: IMG2282

## 2022-04-19 HISTORY — PX: IR US GUIDE VASC ACCESS LEFT: IMG2389

## 2022-04-19 LAB — CBC
HCT: 42 % (ref 39.0–52.0)
Hemoglobin: 13.2 g/dL (ref 13.0–17.0)
MCH: 26.3 pg (ref 26.0–34.0)
MCHC: 31.4 g/dL (ref 30.0–36.0)
MCV: 83.7 fL (ref 80.0–100.0)
Platelets: 247 10*3/uL (ref 150–400)
RBC: 5.02 MIL/uL (ref 4.22–5.81)
RDW: 17.4 % — ABNORMAL HIGH (ref 11.5–15.5)
WBC: 7.8 10*3/uL (ref 4.0–10.5)
nRBC: 0 % (ref 0.0–0.2)

## 2022-04-19 LAB — COMPREHENSIVE METABOLIC PANEL
ALT: 13 U/L (ref 0–44)
AST: 15 U/L (ref 15–41)
Albumin: 3.9 g/dL (ref 3.5–5.0)
Alkaline Phosphatase: 51 U/L (ref 38–126)
Anion gap: 24 — ABNORMAL HIGH (ref 5–15)
BUN: 91 mg/dL — ABNORMAL HIGH (ref 6–20)
CO2: 16 mmol/L — ABNORMAL LOW (ref 22–32)
Calcium: 8.3 mg/dL — ABNORMAL LOW (ref 8.9–10.3)
Chloride: 100 mmol/L (ref 98–111)
Creatinine, Ser: 27.99 mg/dL — ABNORMAL HIGH (ref 0.61–1.24)
GFR, Estimated: 2 mL/min — ABNORMAL LOW (ref >60)
Glucose, Bld: 102 mg/dL — ABNORMAL HIGH (ref 70–99)
Potassium: 6.5 mmol/L (ref 3.5–5.1)
Sodium: 140 mmol/L (ref 135–145)
Total Bilirubin: 0.7 mg/dL (ref 0.3–1.2)
Total Protein: 7.4 g/dL (ref 6.5–8.1)

## 2022-04-19 LAB — HEMOGLOBIN A1C
Hgb A1c MFr Bld: 6.3 % — ABNORMAL HIGH (ref 4.8–5.6)
Mean Plasma Glucose: 134 mg/dL

## 2022-04-19 LAB — GLUCOSE, CAPILLARY
Glucose-Capillary: 103 mg/dL — ABNORMAL HIGH (ref 70–99)
Glucose-Capillary: 98 mg/dL (ref 70–99)
Glucose-Capillary: 156 mg/dL — ABNORMAL HIGH (ref 70–99)

## 2022-04-19 MED ORDER — HEPARIN SODIUM (PORCINE) 1000 UNIT/ML IJ SOLN: 12000.0000 [IU] | Freq: Once | INTRAMUSCULAR | Status: DC

## 2022-04-19 MED ORDER — LIDOCAINE 5 % EX PTCH
2.0000 | MEDICATED_PATCH | Freq: Every day | CUTANEOUS | Status: DC
  Administered 2022-04-19 – 2022-04-20 (×2): 2 via TRANSDERMAL
  Filled 2022-04-19 (×2): qty 2

## 2022-04-19 MED ORDER — HEPARIN SODIUM (PORCINE) 1000 UNIT/ML IJ SOLN: 6000.0000 [IU] | Freq: Once | INTRAMUSCULAR | Status: DC

## 2022-04-19 MED ORDER — HEPARIN SODIUM (PORCINE) 1000 UNIT/ML IJ SOLN
INTRAMUSCULAR | Status: AC
  Filled 2022-04-19: qty 10

## 2022-04-19 MED ORDER — CALCIUM GLUCONATE-NACL 1-0.675 GM/50ML-% IV SOLN
1.0000 g | Freq: Once | INTRAVENOUS | Status: AC
  Administered 2022-04-19: 1000 mg via INTRAVENOUS
  Filled 2022-04-19: qty 50

## 2022-04-19 MED ORDER — HEPARIN SODIUM (PORCINE) 1000 UNIT/ML IJ SOLN
INTRAMUSCULAR | Status: AC
  Administered 2022-04-19: 12000 [IU] via INTRAVENOUS
  Filled 2022-04-19: qty 12

## 2022-04-19 MED ORDER — FENTANYL CITRATE (PF) 100 MCG/2ML IJ SOLN
INTRAMUSCULAR | Status: AC
  Filled 2022-04-19: qty 2

## 2022-04-19 MED ORDER — MIDAZOLAM HCL 2 MG/2ML IJ SOLN
INTRAMUSCULAR | Status: AC | PRN
  Administered 2022-04-19 (×2): .5 mg via INTRAVENOUS

## 2022-04-19 MED ORDER — ACETAMINOPHEN 325 MG PO TABS
ORAL_TABLET | ORAL | Status: AC
  Filled 2022-04-19: qty 3

## 2022-04-19 MED ORDER — MIDAZOLAM HCL 2 MG/2ML IJ SOLN
INTRAMUSCULAR | Status: AC
  Filled 2022-04-19: qty 2

## 2022-04-19 MED ORDER — HEPARIN SODIUM (PORCINE) 1000 UNIT/ML DIALYSIS
6000.0000 [IU] | Freq: Once | INTRAMUSCULAR | Status: AC
  Administered 2022-04-19: 6000 [IU] via INTRAVENOUS_CENTRAL
  Filled 2022-04-19 (×2): qty 6

## 2022-04-19 MED ORDER — SODIUM BICARBONATE 8.4 % IV SOLN
50.0000 meq | Freq: Once | INTRAVENOUS | Status: AC
  Administered 2022-04-19: 50 meq via INTRAVENOUS
  Filled 2022-04-19: qty 50

## 2022-04-19 MED ORDER — ALBUTEROL SULFATE (2.5 MG/3ML) 0.083% IN NEBU
10.0000 mg | INHALATION_SOLUTION | Freq: Once | RESPIRATORY_TRACT | Status: AC
  Administered 2022-04-19: 10 mg via RESPIRATORY_TRACT
  Filled 2022-04-19: qty 12

## 2022-04-19 MED ORDER — SODIUM ZIRCONIUM CYCLOSILICATE 10 G PO PACK
10.0000 g | PACK | Freq: Once | ORAL | Status: AC
  Administered 2022-04-19: 10 g via ORAL
  Filled 2022-04-19: qty 1

## 2022-04-19 MED ORDER — HEPARIN SODIUM (PORCINE) 1000 UNIT/ML DIALYSIS
12000.0000 [IU] | Freq: Once | INTRAMUSCULAR | Status: AC
  Administered 2022-04-19: 12000 [IU] via INTRAVENOUS_CENTRAL
  Filled 2022-04-19: qty 12

## 2022-04-19 MED ORDER — FENTANYL CITRATE (PF) 100 MCG/2ML IJ SOLN
INTRAMUSCULAR | Status: AC | PRN
  Administered 2022-04-19: 25 ug via INTRAVENOUS

## 2022-04-19 MED ORDER — LIDOCAINE HCL 1 % IJ SOLN
INTRAMUSCULAR | Status: AC
  Filled 2022-04-19: qty 20

## 2022-04-19 MED ORDER — CEFAZOLIN SODIUM-DEXTROSE 2-4 GM/100ML-% IV SOLN
INTRAVENOUS | Status: AC
  Administered 2022-04-19: 2 g via INTRAVENOUS
  Filled 2022-04-19: qty 100

## 2022-04-19 MED ORDER — ONDANSETRON 4 MG PO TBDP
4.0000 mg | ORAL_TABLET | Freq: Three times a day (TID) | ORAL | Status: DC | PRN
  Administered 2022-04-19: 4 mg via ORAL
  Filled 2022-04-19: qty 1

## 2022-04-19 NOTE — Progress Notes (Signed)
Pt receives out-pt HD at [facility] on TTS. Will assist as needed.   Damaso Laday Renal Navigator [phone] 

## 2022-04-19 NOTE — Progress Notes (Signed)
Pt arrived to unit

## 2022-04-19 NOTE — Progress Notes (Signed)
HD Note:  Some information was entered later than the data was gathered due to patient care needs. The stated time with the data is accurate in most cases.  The timeout was documented later, but it occurred at 1715.  Received patient in bed to unit.  Alert and oriented.  Informed consent signed and in chart.     Access used: HD catheter Access issues: Lines had to be reversed to function well  Patient asked that the goal of 4000 ml be raised to 6000 ml.  Dr Hollie Salk Notified and agreed to change the order.  SBP parameter of > 90 remains in place.     [facility]

## 2022-04-19 NOTE — Procedures (Signed)
Interventional Radiology Procedure Note  Procedure: Tunneled HD Catheter  Indication: Renal Failure  Findings:  Catheter tip is at the cavo-atrial junction. Catheter is ready for sure. Please refer to procedural dictation for full description.  Complications: None  EBL: < 10 mL  Darria Corvera, MD [phone]   

## 2022-04-19 NOTE — Progress Notes (Addendum)
[facility] Progress Note  Subjective: seen in room. No c/o, no SOB at rest. On RA  Vitals:   04/18/22 2040 04/18/22 2132 04/19/22 0449 04/19/22 0742  BP: (!) 144/103 (!) 132/99 (!) 124/101 129/84  Pulse: 74 74 70 68  Resp: 17 18 18 18   Temp: 98 F (36.7 C) 98 F (36.7 C) 97.6 F (36.4 C) 98.2 F (36.8 C)  TempSrc: Oral Oral Oral Oral  SpO2: 100% 100% 100% 100%  Weight:      Height:        Exam Gen alert, no distress No rash, cyanosis or gangrene Sclera anicteric, throat clear  No jvd or bruits Chest clear bilat to bases, no rales/ wheezing RRR no MRG Abd soft ntnd no mass or ascites +bs GU normal male MS no joint effusions or deformity Ext trace LE edema, no wounds or ulcers Neuro is alert, Ox 3 , nf    LUA AVG+ no bruit      Home meds: sensipar, auryxia, eliquis, gabapentin, midodrine 5 mg tid, crestor, synthroid, prns/ vits/ supps      [facility]  450/1.5  146.5kg  2/2 bath  LUA AVG (clotted)  Hep 12000+ 6067midrun - hectorol 2 ug IV tiw   Assessment/ Plan: Clotted AVG - in LUA. IR planning procedure this afternoon. Has hx of access failures.  Hyperkalemia - K+ up at 6.5 today; will give lokelma, IV Ca, bicarb and albuterol nebs 10mg . HD later today as below.  ESRD - usual HD is [facility]. Last HD 6 days ago. Plan HD today after access established.  BP/ vol - moderate vol excess by history, no resp issues currently. Mild LE edema. Max UF w/ HD.  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  MBD ckd - CCa in range, phos is high. Cont IV vdra, auryxia as binder and sensipar tiw.   Paul [name] 04/19/2022, 11:40 AM   Recent Labs  Lab 04/18/22 1014 04/19/22 0901  HGB 13.8 13.2  ALBUMIN 4.1 3.9  CALCIUM 8.3* 8.3*  PHOS 9.5*  --   CREATININE 25.22* 27.99*  K 5.0 6.5*   No results for input(s): "IRON", "TIBC", "FERRITIN" in the last 168 hours. Inpatient medications:  apixaban  5 mg Oral BID   Chlorhexidine Gluconate Cloth   6 each Topical Q0600   cinacalcet  30 mg Oral Q breakfast   doxercalciferol  2 mcg Intravenous Q T,Th,Sa-HD   ferric citrate  840 mg Oral TID WC   gabapentin  100 mg Oral q AM   gabapentin  200 mg Oral QHS   insulin aspart  0-6 Units Subcutaneous TID WC   levothyroxine  150 mcg Oral QAC breakfast   lidocaine  1 patch Transdermal Q24H   midodrine  5 mg Oral Q T,Th,Sa-HD   multivitamin  1 tablet Oral QHS   rosuvastatin  20 mg Oral Daily     ceFAZolin (ANCEF) IV     acetaminophen, ferric citrate, melatonin, ondansetron

## 2022-04-19 NOTE — Progress Notes (Signed)
     Daily Progress Note Intern Pager: [phone]  Patient name: Paul [name] Medical record number: [id number] Date of birth: 08-20-1990 Age: 31 y.o. Gender: male  Primary Care Provider: Orvis Brill, DO Consultants: IR, nephrology Code Status: Full  Pt Overview and Major Events to Date:  11/27-admitted  Assessment and Plan: Paul [name] is a 31 y.o. male presenting with needing dialysis with complications including blood clotting in AV fistula    * Failure of hemodialysis access [facility]) Typically receives dialysis Tuesday, Thursday, Saturday every week.  Last dialysis was 11/22.  Patient was scheduled for an appointment 11/25 but he was unable to obtain dialysis due to blood clotting and AV fistula.  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  Plan for tunneled cath HD catheter placement today.  Patient was feeling nauseous and weak this morning. - Nephrology consulted, appreciate recommendations - IR consulted, appreciate recommendations - Continue home eliquis, discussed with IR/vascular surgery asked to continue anticoagulation with Eliquis -Plan for tunneled catheter placement -N.p.o. for procedure -Nephro plans to continue HD once procedures completed - Continue cinacalcet, doxercalciferol  - Start Zofran as needed   Hypertension On admission, blood pressure was 136/108.  Last reading is normotensive.  Patient takes midodrine when he receives hemodialysis. - Continue to monitor - Restart home medications as appropriate after med reconciliation completed  Type 2 diabetes mellitus ([place]) Last A1c in 2022 was 6.0.  CBGs ranged from 90s-100s.  Has not needed any short acting insulin. -- CBG monitoring -- Very sensitive SSI       FEN/GI: N.p.o. for IR procedure PPx: Eliquis Dispo:Home in 2-3 days. Barriers include procedure.   Subjective:  Patient was seen in bed this morning.  He reports feeling nauseous and weak and this is due  to not getting hemodialysis since last Wednesday.  Objective: Temp:  [97.6 F (36.4 C)-98.6 F (37 C)] 98.2 F (36.8 C) (11/28 0742) Pulse Rate:  [67-77] 68 (11/28 0742) Resp:  [12-18] 18 (11/28 0742) BP: (123-144)/(84-108) 129/84 (11/28 0742) SpO2:  [96 %-100 %] 100 % (11/28 0742) Physical Exam: General: NAD, obese male Cardiovascular: RRR.  No murmurs/gallops/rubs appreciated Respiratory: CTAB.  Normal effort Abdomen: Normal bowel sounds, soft, obese abdomen Extremities: No lower extremity edema  Laboratory: Most recent CBC Lab Results  Component Value Date   WBC 8.9 04/18/2022   HGB 13.8 04/18/2022   HCT 42.4 04/18/2022   MCV 82.8 04/18/2022   PLT 269 04/18/2022   Most recent BMP    Latest Ref Rng & Units 04/18/2022   10:14 AM  BMP  Glucose 70 - 99 mg/dL 96   BUN 6 - 20 mg/dL 81   Creatinine 0.61 - 1.24 mg/dL 25.22   Sodium 135 - 145 mmol/L 139   Potassium 3.5 - 5.1 mmol/L 5.0   Chloride 98 - 111 mmol/L 102   CO2 22 - 32 mmol/L 17   Calcium 8.9 - 10.3 mg/dL 8.3      Alesia Morin, MD 04/19/2022, 9:26 AM  PGY-1, [place] Intern pager: [phone], text pages welcome Secure chat group [place]

## 2022-04-20 DIAGNOSIS — D631 Anemia in chronic kidney disease: Secondary | ICD-10-CM | POA: Diagnosis not present

## 2022-04-20 DIAGNOSIS — N250 Renal osteodystrophy: Secondary | ICD-10-CM | POA: Diagnosis not present

## 2022-04-20 DIAGNOSIS — Z992 Dependence on renal dialysis: Secondary | ICD-10-CM | POA: Diagnosis not present

## 2022-04-20 DIAGNOSIS — I120 Hypertensive chronic kidney disease with stage 5 chronic kidney disease or end stage renal disease: Secondary | ICD-10-CM | POA: Diagnosis not present

## 2022-04-20 DIAGNOSIS — T829XXA Unspecified complication of cardiac and vascular prosthetic device, implant and graft, initial encounter: Secondary | ICD-10-CM | POA: Diagnosis not present

## 2022-04-20 DIAGNOSIS — N186 End stage renal disease: Secondary | ICD-10-CM | POA: Diagnosis not present

## 2022-04-20 LAB — CBC
HCT: 42.1 % (ref 39.0–52.0)
Hemoglobin: 13.5 g/dL (ref 13.0–17.0)
MCH: 26.2 pg (ref 26.0–34.0)
MCHC: 32.1 g/dL (ref 30.0–36.0)
MCV: 81.7 fL (ref 80.0–100.0)
Platelets: 251 10*3/uL (ref 150–400)
RBC: 5.15 MIL/uL (ref 4.22–5.81)
RDW: 17.3 % — ABNORMAL HIGH (ref 11.5–15.5)
WBC: 8.4 10*3/uL (ref 4.0–10.5)
nRBC: 0 % (ref 0.0–0.2)

## 2022-04-20 LAB — RENAL FUNCTION PANEL
Albumin: 4.2 g/dL (ref 3.5–5.0)
Anion gap: 19 — ABNORMAL HIGH (ref 5–15)
BUN: 52 mg/dL — ABNORMAL HIGH (ref 6–20)
CO2: 23 mmol/L (ref 22–32)
Calcium: 8.4 mg/dL — ABNORMAL LOW (ref 8.9–10.3)
Chloride: 94 mmol/L — ABNORMAL LOW (ref 98–111)
Creatinine, Ser: 17.74 mg/dL — ABNORMAL HIGH (ref 0.61–1.24)
GFR, Estimated: 3 mL/min — ABNORMAL LOW (ref >60)
Glucose, Bld: 128 mg/dL — ABNORMAL HIGH (ref 70–99)
Phosphorus: 7.6 mg/dL — ABNORMAL HIGH (ref 2.5–4.6)
Potassium: 4.4 mmol/L (ref 3.5–5.1)
Sodium: 136 mmol/L (ref 135–145)

## 2022-04-20 LAB — HEPATITIS B SURFACE ANTIBODY, QUANTITATIVE: Hep B S AB Quant (Post): 8.8 m[IU]/mL — ABNORMAL LOW (ref >9.9)

## 2022-04-20 LAB — GLUCOSE, CAPILLARY
Glucose-Capillary: 117 mg/dL — ABNORMAL HIGH (ref 70–99)
Glucose-Capillary: 183 mg/dL — ABNORMAL HIGH (ref 70–99)
Glucose-Capillary: 93 mg/dL (ref 70–99)

## 2022-04-20 MED ORDER — OXYCODONE HCL 5 MG PO TABS
2.5000 mg | ORAL_TABLET | Freq: Once | ORAL | Status: AC
  Administered 2022-04-20: 2.5 mg via ORAL
  Filled 2022-04-20: qty 1

## 2022-04-20 MED ORDER — OXYCODONE HCL 5 MG PO TABS: 5.0000 mg | ORAL_TABLET | Freq: Three times a day (TID) | ORAL | 0 refills | Status: DC | PRN

## 2022-04-20 NOTE — Progress Notes (Signed)
D/C order noted. Contacted [facility] to advise clinic of pt's d/c today and that pt should resume care tomorrow.   Melven Sartorius Renal Navigator [phone]

## 2022-04-20 NOTE — Progress Notes (Signed)
Pt has DC order. Pt will be DC with tunneled HD cath per IR and Nephro, verified by MD. Pt was informed that IR will contact the pt for the follow-up. AVS was given and explained to pt, all questions has been answered,. Mother picked -up pt, DC home.

## 2022-04-20 NOTE — Discharge Summary (Addendum)
[facility] Discharge Summary  Patient name: Paul [name] Medical record number: [id number] Date of birth: Mar 23, 1991 Age: 31 y.o. Gender: male Date of Admission: 04/18/2022  Date of Discharge: 04/20/2022 Admitting Physician: Kinnie Feil, MD  Primary Care Provider: Orvis Brill, DO Consultants: IR, nephrology  Indication for Hospitalization: Hemodialysis access  Discharge Diagnoses/Problem List:  Principal Problem for Admission: Hemodialysis access Other Problems addressed during stay:  Principal Problem:   Failure of hemodialysis access [facility]) Active Problems:   Type 2 diabetes mellitus ([place])   Hypertension   Complication of vascular access for dialysis   ESRD (end stage renal disease) [facility])    Brief Hospital Course:  Paul [name] is a 31 y.o. male patient with PMH significant for ESRD on HD, T2DM, GERD, HLD, HTN, hypothyroidism, and morbid obesity admitted for complications with dialysis d/t blood clots in AV fistula.  Failure of HD access Patient receives dialysis Tu/Th/Sat weekly, last received on 11/22. Has been on long-term coagulation for history of dialysis clot since Dec 2022. Had a tunneled HD catheter placement 11/28 which followed with HD.  Will follow-up with IR outpatient to fix AV graft, he was instructed that he will be contacted regarding scheduling follow up.   All other conditions chronic and stable.   Disposition: Home  Discharge Condition: Stable  Issues for Follow Up:  HTN, T2DM, hypothyroidism- evaluate if any medication changes need to occur 2. Please ensure that patient follows up with IR to fix AV fistula.   Discharge Exam:  Vitals:   04/20/22 0410 04/20/22 0938  BP: 124/75 112/71  Pulse: 90 78  Resp: 17 15  Temp: 98.2 F (36.8 C) 98.1 F (36.7 C)  SpO2: 99% 97%   Physical Exam: General: No acute distress.  Well-appearing, obese male Cardiovascular: RRR.  No rubs/gallops/murmurs  appreciated Respiratory: Normal effort Abdomen: Obese abdomen.  Soft, nondistended, nontender Skin: Tunneled HD cath on left chest, lidocaine patch adjacent to catheter  Physical exam performed by Dr. Vergia Alberts   Significant Procedures: HD tunneled catheter placement  Significant Labs and Imaging:  Recent Labs  Lab 04/19/22 0901 04/20/22 0608  WBC 7.8 8.4  HGB 13.2 13.5  HCT 42.0 42.1  PLT 247 251   Recent Labs  Lab 04/19/22 0901 04/20/22 0608  NA 140 136  K 6.5* 4.4  CL 100 94*  CO2 16* 23  GLUCOSE 102* 128*  BUN 91* 52*  CREATININE 27.99* 17.74*  CALCIUM 8.3* 8.4*  PHOS  --  7.6*  ALKPHOS 51  --   AST 15  --   ALT 13  --   ALBUMIN 3.9 4.2     Results/Tests Pending at Time of Discharge: none  Discharge Medications:  Allergies as of 04/20/2022       Reactions   Hydralazine Hcl Other (See Comments)   DRUG-INDUCED LUPUS        Medication List     TAKE these medications    Accu-Chek Guide Me w/Device Kit 1 kit by Does not apply route daily.   Accu-Chek Guide test strip Generic drug: glucose blood 1 each by Other route 2 (two) times daily. Use as instructed   Accu-Chek Softclix Lancets lancets Use as instructed   acetaminophen 500 MG tablet Commonly known as: TYLENOL Take 1,000 mg by mouth every 6 (six) hours as needed (for pain.).   B-D SYRINGE SLIP TIP 30CC 30 ML Misc Generic drug: Syringe (Disposable) 1 Syringe by Does not apply route daily.   calcium  carbonate 500 MG chewable tablet Commonly known as: TUMS - dosed in mg elemental calcium Chew 1,000 mg by mouth daily as needed for indigestion or heartburn.   cetirizine 10 MG tablet Commonly known as: ZYRTEC Take 10 mg by mouth daily as needed for allergies.   cinacalcet 30 MG tablet Commonly known as: SENSIPAR Take 30 mg by mouth daily.   Eliquis 5 MG Tabs tablet Generic drug: apixaban Take 1 tablet by mouth twice daily   ferric citrate 1 GM 210 MG(Fe) tablet Commonly known as:  AURYXIA Take 420-840 mg by mouth See admin instructions. Take 4 tablets (840 mg) by mouth with each meal & take 2 tablets (420 mg) by mouth with each snack.   gabapentin 100 MG capsule Commonly known as: NEURONTIN TAKE 1 CAPSULE BY MOUTH THREE TIMES DAILY What changed:  when to take this additional instructions   Lantus 100 UNIT/ML injection Generic drug: insulin glargine Inject 0.05 mLs (5 Units total) into the skin 2 (two) times daily.   levothyroxine 150 MCG tablet Commonly known as: SYNTHROID TAKE 1 TABLET BY MOUTH ONCE DAILY BEFORE BREAKFAST   midodrine 5 MG tablet Commonly known as: PROAMATINE Take 1 tablet (5 mg total) by mouth 3 (three) times daily with meals. What changed: when to take this   multivitamin Tabs tablet Take 1 tablet by mouth at bedtime.   oxyCODONE 5 MG immediate release tablet Commonly known as: Roxicodone Take 1 tablet (5 mg total) by mouth every 8 (eight) hours as needed.   rosuvastatin 20 MG tablet Commonly known as: CRESTOR Take 1 tablet by mouth once daily        Discharge Instructions: Please refer to Patient Instructions section of EMR for full details.  Patient was counseled important signs and symptoms that should prompt return to medical care, changes in medications, dietary instructions, activity restrictions, and follow up appointments.   Follow-Up Appointments:  Follow-up Information     Ezequiel Essex, MD. Go on 04/29/2022.   Specialty: Family Medicine Why: Appointment at 9:10 am. Contact information: [place] [address] [phone]                 Donney Dice, DO 04/20/2022, 11:37 AM PGY-3, [place]

## 2022-04-20 NOTE — Progress Notes (Signed)
[facility] Progress Note   Subjective:   Patient seen and examined at bedside.  Denies CP, SOB, abdominal pain and n/v/d.  UF 6L with HD overnight using new TDC.  Patient reports plan for declot at some point by IR. He is unsure if it to be done as inpatient or outpatient procedure.   Objective Vitals:   04/19/22 2147 04/19/22 2241 04/20/22 0410 04/20/22 0938  BP: 131/83 (!) 122/52 124/75 112/71  Pulse: 95 100 90 78  Resp: 18 19 17 15   Temp: 98.2 F (36.8 C) 98.6 F (37 C) 98.2 F (36.8 C) 98.1 F (36.7 C)  TempSrc: Oral Oral Oral Oral  SpO2: 97% 98% 99% 97%  Weight:  (!) 150 kg    Height:       Physical Exam General:well appearing male in NAD Heart:RRR, no mrg Lungs:CTAB, nml WOB on RA Abdomen:soft, NTND Extremities:trace LE edema Dialysis Access: LU AVG no b/t, L EJ [facility]   Filed Weights   04/18/22 0854 04/19/22 1713 04/19/22 2241  Weight: (!) 147.4 kg (!) 158.7 kg (!) 150 kg    Intake/Output Summary (Last 24 hours) at 04/20/2022 0942 Last data filed at 04/19/2022 2300 Gross per 24 hour  Intake 340 ml  Output 6000 ml  Net -5660 ml    Additional Objective Labs: Basic Metabolic Panel: Recent Labs  Lab 04/18/22 1014 04/19/22 0901 04/20/22 0608  NA 139 140 136  K 5.0 6.5* 4.4  CL 102 100 94*  CO2 17* 16* 23  GLUCOSE 96 102* 128*  BUN 81* 91* 52*  CREATININE 25.22* 27.99* 17.74*  CALCIUM 8.3* 8.3* 8.4*  PHOS 9.5*  --  7.6*   Liver Function Tests: Recent Labs  Lab 04/18/22 1014 04/19/22 0901 04/20/22 0608  AST 12* 15  --   ALT 13 13  --   ALKPHOS 51 51  --   BILITOT 0.7 0.7  --   PROT 7.6 7.4  --   ALBUMIN 4.1 3.9 4.2   CBC: Recent Labs  Lab 04/18/22 1014 04/19/22 0901 04/20/22 0608  WBC 8.9 7.8 8.4  NEUTROABS 5.9  --   --   HGB 13.8 13.2 13.5  HCT 42.4 42.0 42.1  MCV 82.8 83.7 81.7  PLT 269 247 251    CBG: Recent Labs  Lab 04/19/22 0741 04/19/22 0819 04/19/22 1201 04/19/22 2312 04/20/22 0912  GLUCAP 98 103*  156* 117* 183*    Studies/Results: IR Fluoro Guide CV Line Left  Result Date: 04/20/2022 INDICATION: 31 year old gentleman with chronic renal failure on hemodialysis has a thrombosed left upper extremity dialysis graft. He presents to IR today for tunneled HD catheter placement. EXAM: Tunneled left external jugular vein dialysis catheter placement MEDICATIONS: Ancef 2 g IV; The antibiotic was administered within an appropriate time interval prior to skin puncture. ANESTHESIA/SEDATION: Moderate (conscious) sedation was employed during this procedure. A total of Versed 1 mg and Fentanyl 25 mcg was administered intravenously by the radiology nurse. Total intra-service moderate Sedation Time: 32 minutes. The patient's level of consciousness and vital signs were monitored continuously by radiology nursing throughout the procedure under my direct supervision. FLUOROSCOPY: Radiation Exposure Index (as provided by the fluoroscopic device): 425 mGy Kerma COMPLICATIONS: None immediate. PROCEDURE: Informed written consent was obtained from the patient after a thorough discussion of the procedural risks, benefits and alternatives. All questions were addressed. Maximal Sterile Barrier Technique was utilized including caps, mask, sterile gowns, sterile gloves, sterile drape, hand hygiene and skin antiseptic. A timeout was  performed prior to the initiation of the procedure. Patient positioned supine on the procedure table. Evaluation of the right neck showed the internal jugular vein to be thrombosed. Sterile ultrasound probe cover and gel were utilized throughout the procedure. An image documenting patency of the right external jugular vein was obtained and placed in permanent medical record. Utilizing continuous ultrasound guidance, the right internal jugular vein was accessed with a 21 gauge needle. Despite multiple attempts, the guidewire could not be advanced centrally due to tortuosity of the vein. The left neck skin  was then prepped and draped usual fashion. Ultrasound evaluation of the left neck showed the internal jugular vein to be thrombosed. The left external jugular vein was patent. Utilizing continuous ultrasound guidance, the external jugular vein was accessed with a 21 gauge needle. 21 gauge needle exchanged for transitional dilator set over 0.018 inch guidewire. Transitional dilator set removed over 0.035 inch guidewire. The guidewire was advanced to the level of the IVC utilizing fluoroscopic guidance. Serial dilation was performed and peel-away sheath was placed. Following local lidocaine administration, 28 cm hemodialysis catheter was brought through a short subcutaneous tunnel in the left anterior chest wall to the venotomy site and inserted through the peel-away sheath. The tip was positioned within the right atrium. Both lumens aspirated and flushed well and were locked with heparin. Catheter secured to skin with silk suture. IMPRESSION: 1. Tunneled left external jugular vein hemodialysis catheter is ready for use (28 cm). 2. 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 Right external jugular vein is patent, however does not communicate with central venous structures in a pattern which would allow for catheter placement. Electronically Signed   By: Miachel Roux M.D.   On: 04/20/2022 08:05   IR US Guide Vasc Access Left  Result Date: 04/20/2022 INDICATION: 31 year old gentleman with chronic renal failure on hemodialysis has a thrombosed left upper extremity dialysis graft. He presents to IR today for tunneled HD catheter placement. EXAM: Tunneled left external jugular vein dialysis catheter placement MEDICATIONS: Ancef 2 g IV; The antibiotic was administered within an appropriate time interval prior to skin puncture. ANESTHESIA/SEDATION: Moderate (conscious) sedation was employed during this procedure. A total of Versed 1 mg and Fentanyl 25 mcg was administered intravenously by the radiology nurse. Total  intra-service moderate Sedation Time: 32 minutes. The patient's level of consciousness and vital signs were monitored continuously by radiology nursing throughout the procedure under my direct supervision. FLUOROSCOPY: Radiation Exposure Index (as provided by the fluoroscopic device): 035 mGy Kerma COMPLICATIONS: None immediate. PROCEDURE: Informed written consent was obtained from the patient after a thorough discussion of the procedural risks, benefits and alternatives. All questions were addressed. Maximal Sterile Barrier Technique was utilized including caps, mask, sterile gowns, sterile gloves, sterile drape, hand hygiene and skin antiseptic. A timeout was performed prior to the initiation of the procedure. Patient positioned supine on the procedure table. Evaluation of the right neck showed the internal jugular vein to be thrombosed. Sterile ultrasound probe cover and gel were utilized throughout the procedure. An image documenting patency of the right external jugular vein was obtained and placed in permanent medical record. Utilizing continuous ultrasound guidance, the right internal jugular vein was accessed with a 21 gauge needle. Despite multiple attempts, the guidewire could not be advanced centrally due to tortuosity of the vein. The left neck skin was then prepped and draped usual fashion. Ultrasound evaluation of the left neck showed the internal jugular vein to be thrombosed. The left external jugular vein was patent.  Utilizing continuous ultrasound guidance, the external jugular vein was accessed with a 21 gauge needle. 21 gauge needle exchanged for transitional dilator set over 0.018 inch guidewire. Transitional dilator set removed over 0.035 inch guidewire. The guidewire was advanced to the level of the IVC utilizing fluoroscopic guidance. Serial dilation was performed and peel-away sheath was placed. Following local lidocaine administration, 28 cm hemodialysis catheter was brought through a short  subcutaneous tunnel in the left anterior chest wall to the venotomy site and inserted through the peel-away sheath. The tip was positioned within the right atrium. Both lumens aspirated and flushed well and were locked with heparin. Catheter secured to skin with silk suture. IMPRESSION: 1. Tunneled left external jugular vein hemodialysis catheter is ready for use (28 cm). 2. 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 Right external jugular vein is patent, however does not communicate with central venous structures in a pattern which would allow for catheter placement. Electronically Signed   By: Miachel Roux M.D.   On: 04/20/2022 08:05   DG Chest 2 View  Result Date: 04/18/2022 CLINICAL DATA:  Shortness of breath for 1-2 days. History of diabetes and end-stage renal disease. EXAM: CHEST - 2 VIEW COMPARISON:  Radiographs 05/06/2021 and 03/22/2018. FINDINGS: Hemodialysis catheter has been removed. A vascular stent is noted within the right axilla. The heart size and mediastinal contours are normal. The lungs are clear. There is no pleural effusion or pneumothorax. No acute osseous findings are identified. IMPRESSION: No active cardiopulmonary process. Electronically Signed   By: Richardean Sale M.D.   On: 04/18/2022 10:38    Medications:   apixaban  5 mg Oral BID   Chlorhexidine Gluconate Cloth  6 each Topical Q0600   cinacalcet  30 mg Oral Q breakfast   doxercalciferol  2 mcg Intravenous Q T,Th,Sa-HD   ferric citrate  840 mg Oral TID WC   gabapentin  100 mg Oral q AM   gabapentin  200 mg Oral QHS   insulin aspart  0-6 Units Subcutaneous TID WC   levothyroxine  150 mcg Oral QAC breakfast   lidocaine  2 patch Transdermal Daily   midodrine  5 mg Oral Q T,Th,Sa-HD   multivitamin  1 tablet Oral QHS   rosuvastatin  20 mg Oral Daily    Dialysis Orders: [facility] 4.5h  450/1.5  146.5kg  2/2 bath  LUA AVG (clotted)  Hep 12000+ [id number] - hectorol 2 ug IV tiw   Assessment/ Plan: Clotted AVG -  in LUA. Hx of access failures. New TDC placed yesterday by IR w/plan for declot at some point. PMD already reached out to IR to clarify when.  Hyperkalemia - K 4.4 today post HD.  BP/ vol - BP in goal.  Net UF 6L yesterday, remains over dry weight.  No signs of respiratory distress.  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  MBD ckd - CCa in range, phos is high. Cont IV vdra, auryxia as binder and sensipar tiw. Nutrition - Renal diet w/fluid restrictions Dispo - ok for d/c from renal standpoint once plans clarified for AVG declot    Jen Mow, PA-C [facility] 04/20/2022,9:42 AM  LOS: 1 day

## 2022-04-20 NOTE — Plan of Care (Signed)
Spoke with vascular surgeon Dr. Trula Slade regarding management of Eliquis.  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  Dr. Trula Slade recommended that the patient stay on Eliquis as long as he is having hemodialysis done.  We will continue Eliquis at this time.  Coralyn Pear, MD PGY-1 [facility]MT[facility]

## 2022-04-20 NOTE — Progress Notes (Signed)
     Daily Progress Note Intern Pager: [phone]  Patient name: Paul [name] Medical record number: [id number] Date of birth: 20-Oct-1990 Age: 31 y.o. Gender: male  Primary Care Provider: Orvis Brill, DO Consultants: IR, nephrology Code Status: Full  Pt Overview and Major Events to Date:  11/27-admitted   Assessment and Plan: Chantry Headen is a 31 y.o. male presenting with needing dialysis with complications including blood clotting in AV fistula    * Failure of hemodialysis access ([place]) Tunneled HD catheter procedure completed.  Patient had HD yesterday, tolerated treatment session.  Given 1 dose of oxycodone 2.5 mg due to pain from the tunneled HD catheter. - Nephrology consulted, appreciate recommendations - IR consulted, appreciate recommendations -Clarify with IR regarding AV graft management - Continue home eliquis while patient receives HD per vascular surgery recs - Continue cinacalcet, doxercalciferol  - Continue Zofran as needed - Continue Tylenol 1000 mg every 6 hours as needed and lidocaine for pain   Hypertension Normotensive today. Patient takes midodrine when he receives hemodialysis. - Continue midodrine  Type 2 diabetes mellitus (HCC) A1c in hospital is 6.3.  CBGs ranged from 110s-150s, received 1 unit of short acting insulin. -- CBG monitoring -- Very sensitive SSI       FEN/GI: Renal with fluid restriction PPx: Eliquis Dispo:Home in 2-3 days.   Subjective:  Patient was seen in bed eating breakfast.  He reports improving nausea and weakness post HD.  He also feels that his swelling has improved.  He was asking for medications for pain from tunneled HD cath.  Objective: Temp:  [97.8 F (36.6 C)-98.6 F (37 C)] 98.2 F (36.8 C) (11/29 0410) Pulse Rate:  [66-103] 90 (11/29 0410) Resp:  [17-25] 17 (11/29 0410) BP: (122-177)/(50-97) 124/75 (11/29 0410) SpO2:  [97 %-100 %] 99 % (11/29 0410) Weight:  [150 kg-158.7 kg] 150 kg (11/28  2241) Physical Exam: General: No acute distress.  Well-appearing, obese male Cardiovascular: RRR.  No rubs/gallops/murmurs appreciated Respiratory: Normal effort Abdomen: Obese abdomen.  Soft, nondistended, nontender Skin: Tunneled HD cath on left chest, lidocaine patch adjacent to catheter   Laboratory: Most recent CBC Lab Results  Component Value Date   WBC 8.4 04/20/2022   HGB 13.5 04/20/2022   HCT 42.1 04/20/2022   MCV 81.7 04/20/2022   PLT 251 04/20/2022   Most recent BMP    Latest Ref Rng & Units 04/20/2022    6:08 AM  BMP  Glucose 70 - 99 mg/dL 128   BUN 6 - 20 mg/dL 52   Creatinine 0.61 - 1.24 mg/dL 17.74   Sodium 135 - 145 mmol/L 136   Potassium 3.5 - 5.1 mmol/L 4.4   Chloride 98 - 111 mmol/L 94   CO2 22 - 32 mmol/L 23   Calcium 8.9 - 10.3 mg/dL 8.4      Alesia Morin, MD 04/20/2022, 9:06 AM  PGY-1, [place] Intern pager: [phone], text pages welcome Secure chat group [place]

## 2022-04-20 NOTE — Discharge Instructions (Addendum)
You were hospitalized at [facility] due to dialysis access issues.  We are so glad you are feeling better.  Be sure to follow-up with your regularly scheduled appointments.  Please also be sure to follow-up with our clinic on 12/8 at 9:10 am with Dr. Jeani Hawking. Please make sure to follow up with interventional radiology as well, they will call you to schedule an appointment. Please make sure to take all your medications as prescribed. Thank you for allowing [place] to be a part of your medical care.  Take care, [facility] family medicine team

## 2022-04-21 ENCOUNTER — Telehealth (HOSPITAL_COMMUNITY): Payer: Self-pay

## 2022-04-21 ENCOUNTER — Other Ambulatory Visit (HOSPITAL_COMMUNITY): Payer: Self-pay | Admitting: Interventional Radiology

## 2022-04-21 DIAGNOSIS — Z992 Dependence on renal dialysis: Secondary | ICD-10-CM | POA: Diagnosis not present

## 2022-04-21 DIAGNOSIS — N186 End stage renal disease: Secondary | ICD-10-CM | POA: Diagnosis not present

## 2022-04-21 DIAGNOSIS — E1129 Type 2 diabetes mellitus with other diabetic kidney complication: Secondary | ICD-10-CM | POA: Diagnosis not present

## 2022-04-21 NOTE — Telephone Encounter (Signed)
Called to schedule declot w/Dr. Mir, no answer, no vm. AW

## 2022-04-26 ENCOUNTER — Other Ambulatory Visit (HOSPITAL_COMMUNITY): Payer: Self-pay | Admitting: Physician Assistant

## 2022-04-26 DIAGNOSIS — Z01818 Encounter for other preprocedural examination: Secondary | ICD-10-CM

## 2022-04-27 ENCOUNTER — Other Ambulatory Visit (HOSPITAL_COMMUNITY): Payer: Self-pay | Admitting: Interventional Radiology

## 2022-04-27 ENCOUNTER — Ambulatory Visit (HOSPITAL_COMMUNITY)
Admission: RE | Admit: 2022-04-27 | Discharge: 2022-04-27 | Disposition: A | Discharge status: Home / Self Care | Payer: Medicaid Other | Source: Ambulatory Visit | Attending: Interventional Radiology | Admitting: Interventional Radiology

## 2022-04-27 ENCOUNTER — Other Ambulatory Visit: Payer: Self-pay

## 2022-04-27 ENCOUNTER — Encounter (HOSPITAL_COMMUNITY): Payer: Self-pay

## 2022-04-27 DIAGNOSIS — Z01818 Encounter for other preprocedural examination: Secondary | ICD-10-CM

## 2022-04-27 DIAGNOSIS — Z992 Dependence on renal dialysis: Secondary | ICD-10-CM | POA: Insufficient documentation

## 2022-04-27 DIAGNOSIS — I120 Hypertensive chronic kidney disease with stage 5 chronic kidney disease or end stage renal disease: Secondary | ICD-10-CM | POA: Insufficient documentation

## 2022-04-27 DIAGNOSIS — T82868A Thrombosis of vascular prosthetic devices, implants and grafts, initial encounter: Secondary | ICD-10-CM | POA: Insufficient documentation

## 2022-04-27 DIAGNOSIS — E1122 Type 2 diabetes mellitus with diabetic chronic kidney disease: Secondary | ICD-10-CM | POA: Insufficient documentation

## 2022-04-27 DIAGNOSIS — N186 End stage renal disease: Secondary | ICD-10-CM

## 2022-04-27 DIAGNOSIS — Y841 Kidney dialysis as the cause of abnormal reaction of the patient, or of later complication, without mention of misadventure at the time of the procedure: Secondary | ICD-10-CM | POA: Insufficient documentation

## 2022-04-27 DIAGNOSIS — Z794 Long term (current) use of insulin: Secondary | ICD-10-CM | POA: Diagnosis not present

## 2022-04-27 HISTORY — PX: IR US GUIDE VASC ACCESS LEFT: IMG2389

## 2022-04-27 HISTORY — PX: IR THROMBECTOMY AV FISTULA W/THROMBOLYSIS/PTA/STENT INC/SHUNT/IMG LT: IMG6107

## 2022-04-27 LAB — BASIC METABOLIC PANEL
Anion gap: 16 — ABNORMAL HIGH (ref 5–15)
BUN: 43 mg/dL — ABNORMAL HIGH (ref 6–20)
CO2: 24 mmol/L (ref 22–32)
Calcium: 8.8 mg/dL — ABNORMAL LOW (ref 8.9–10.3)
Chloride: 98 mmol/L (ref 98–111)
Creatinine, Ser: 14.16 mg/dL — ABNORMAL HIGH (ref 0.61–1.24)
GFR, Estimated: 4 mL/min — ABNORMAL LOW (ref >60)
Glucose, Bld: 107 mg/dL — ABNORMAL HIGH (ref 70–99)
Potassium: 4.2 mmol/L (ref 3.5–5.1)
Sodium: 138 mmol/L (ref 135–145)

## 2022-04-27 LAB — GLUCOSE, CAPILLARY: Glucose-Capillary: 133 mg/dL — ABNORMAL HIGH (ref 70–99)

## 2022-04-27 MED ORDER — HEPARIN SODIUM (PORCINE) 1000 UNIT/ML IJ SOLN
INTRAMUSCULAR | Status: AC
  Filled 2022-04-27: qty 10

## 2022-04-27 MED ORDER — FENTANYL CITRATE (PF) 100 MCG/2ML IJ SOLN
INTRAMUSCULAR | Status: AC
  Filled 2022-04-27: qty 2

## 2022-04-27 MED ORDER — MIDAZOLAM HCL 2 MG/2ML IJ SOLN
INTRAMUSCULAR | Status: AC | PRN
  Administered 2022-04-27 (×2): .5 mg via INTRAVENOUS
  Administered 2022-04-27: 1 mg via INTRAVENOUS

## 2022-04-27 MED ORDER — MIDAZOLAM HCL 2 MG/2ML IJ SOLN
INTRAMUSCULAR | Status: AC
  Filled 2022-04-27: qty 2

## 2022-04-27 MED ORDER — IOHEXOL 300 MG/ML  SOLN
50.0000 mL | Freq: Once | INTRAMUSCULAR | Status: AC | PRN
  Administered 2022-04-27: 25 mL via INTRA_ARTERIAL

## 2022-04-27 MED ORDER — ALTEPLASE 2 MG IJ SOLR
INTRAMUSCULAR | Status: AC
  Filled 2022-04-27: qty 2

## 2022-04-27 MED ORDER — ALTEPLASE 2 MG IJ SOLR
INTRAMUSCULAR | Status: AC | PRN
  Administered 2022-04-27: 2 mg

## 2022-04-27 MED ORDER — CEFAZOLIN SODIUM-DEXTROSE 2-4 GM/100ML-% IV SOLN
INTRAVENOUS | Status: AC
  Administered 2022-04-27: 2 g
  Filled 2022-04-27: qty 100

## 2022-04-27 MED ORDER — SODIUM CHLORIDE 0.9 % IV SOLN: INTRAVENOUS | Status: DC

## 2022-04-27 MED ORDER — IOHEXOL 300 MG/ML  SOLN
100.0000 mL | Freq: Once | INTRAMUSCULAR | Status: AC | PRN
  Administered 2022-04-27: 50 mL via INTRAVENOUS

## 2022-04-27 MED ORDER — FENTANYL CITRATE (PF) 100 MCG/2ML IJ SOLN
INTRAMUSCULAR | Status: AC | PRN
  Administered 2022-04-27 (×6): 25 ug via INTRAVENOUS

## 2022-04-27 MED ORDER — LIDOCAINE HCL 1 % IJ SOLN
INTRAMUSCULAR | Status: AC
  Filled 2022-04-27: qty 20

## 2022-04-27 MED ORDER — HEPARIN SODIUM (PORCINE) 1000 UNIT/ML IJ SOLN
INTRAMUSCULAR | Status: AC | PRN
  Administered 2022-04-27: 5000 [IU] via INTRAVENOUS
  Administered 2022-04-27: 1000 [IU]

## 2022-04-27 NOTE — Sedation Documentation (Signed)
Per MD, "it's flowing and doesn't feel as clotted, in reference to pt access graft at this time. MD advises pt at this time that "the problems isn't that it's clotted, the problem is the flow".

## 2022-04-27 NOTE — H&P (Signed)
Chief Complaint: Patient was seen in consultation today for left upper arm dialysis fistula declot at the request of Dr Baruch Goldmann  Supervising Physician: Mir, Sharen Heck  Patient Status: [facility] - Out-pt  History of Present Illness: Paul [name] is a 31 y.o. male   ESRD Left upper arm dialysis fistula Last use of this has been a few weeks ago Was in IR for evaluation and possible declot 04/15/22:  IMPRESSION: 1. Left upper extremity fistula/graft is patent but there is a short segment occlusion of the outflow vein in the upper arm. The AV fistula is draining through collateral venous flow. Unsuccessful recanalization of this occluded outflow vein. Recommend surgical consultation. 2. Probable stenosis near the arterial anastomosis. 3. Recommended placement of a tunneled dialysis catheter but the patient declined catheter placement at this time  Was seen 04/19/22:  MPRESSION: 1. Tunneled left external jugular vein hemodialysis catheter is ready for use (28 cm). 2. 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 Right external jugular vein is patent, however does not communicate with central venous structures in a pattern which would allow for catheter placement.  Pt has used tunneled left ext jugular vein catheter since then; Last use yesterday--- working well  Scheduled today for attempt again at left upper arm dialysis fistula declot/angioplasty/stent placement in IR  Past Medical History:  Diagnosis Date   Acute hypoxemic respiratory failure ([place]) 09/11/2016   Anemia    Chronic kidney disease    ARF on CRF Dialysis T/TH/Sa   Diabetes mellitus    Type II   End stage renal disease on dialysis ([place])    [place]    GERD (gastroesophageal reflux disease)    diet controlled   HLD (hyperlipidemia)    Hypertension    Hypothyroidism    Morbid obesity ([place])    Sacral wound    resolved   Thyroid disease     Past Surgical History:  Procedure Laterality Date    AMPUTATION Left 05/08/2021   Procedure: AMPUTATION GREAT TOE;  Surgeon: Newt Minion, MD;  Location: [place];  Service: Orthopedics;  Laterality: Left;   AV FISTULA PLACEMENT Left 09/26/2016   Procedure: LEFT UPPER ARM ARTERIOVENOUS (AV) FISTULA CREATION;  Surgeon: Elam Dutch, MD;  Location: [place];  Service: Vascular;  Laterality: Left;   AV FISTULA PLACEMENT Left 12/02/2016   Procedure: INSERTION OF ARTERIOVENOUS GORE-TEX GRAFT LEFT UPPER  ARM USING A 4-7MM BY 45CM GRAFT ;  Surgeon: Rosetta Posner, MD;  Location: [place];  Service: Vascular;  Laterality: Left;   AV FISTULA PLACEMENT Right 07/03/2020   Procedure: INSERTION OF ARTERIOVENOUS (AV) GORE-TEX GRAFT RIGHT UPPER ARM;  Surgeon: Marty Heck, MD;  Location: [place];  Service: Vascular;  Laterality: Right;   AV FISTULA PLACEMENT Left 07/02/2021   Procedure: LEFT UPPER EXTREMITY ARTERIOVENOUS FISTULA CREATION;  Surgeon: Waynetta Sandy, MD;  Location: [place];  Service: Vascular;  Laterality: Left;   [place] Right 05/06/2020   Procedure: FIRST STAGE RIGHT [place];  Surgeon: Serafina Mitchell, MD;  Location: [place];  Service: Vascular;  Laterality: Right;   [place] Left 08/20/2021   Procedure: LEFT ARM SECOND STAGE WITH ARTERIOVENOUS GRAFT;  Surgeon: Waynetta Sandy, MD;  Location: [place];  Service: Vascular;  Laterality: Left;   EXCHANGE OF A DIALYSIS CATHETER Right 10/08/2016   Procedure: EXCHANGE OF A DIALYSIS CATHETER-RIGHT INTERNAL JUGULAR;  Surgeon: Conrad Lookeba, MD;  Location: [place];  Service: Vascular;  Laterality: Right;   INSERTION OF DIALYSIS CATHETER Right 09/26/2016   Procedure: INSERTION OF DIALYSIS CATHETER - Right Internal Jugular Placement;  Surgeon: Elam Dutch, MD;  Location: [facility] OR;  Service: Vascular;  Laterality: Right;   INSERTION OF DIALYSIS CATHETER Left 03/22/2018   Procedure: INSERTION OF DIALYSIS CATHETER;  Surgeon: Marty Heck, MD;  Location: [place];  Service: Vascular;  Laterality: Left;   IR DIALY SHUNT INTRO NEEDLE/INTRACATH INITIAL W/IMG LEFT Left 04/15/2022   IR FLUORO GUIDE CV LINE LEFT  04/19/2022   IR FLUORO GUIDE CV LINE RIGHT  03/26/2021   IR FLUORO GUIDE CV LINE RIGHT  05/13/2021   IR REMOVAL TUN CV CATH W/O FL  05/11/2021   IR REMOVAL TUN CV CATH W/O FL  10/06/2021   IR THROMBECTOMY AV FISTULA W/THROMBOLYSIS/PTA INC/SHUNT/IMG LEFT Left 02/02/2022   IR THROMBECTOMY AV FISTULA W/THROMBOLYSIS/PTA INC/SHUNT/IMG LEFT Left 03/22/2022   IR US GUIDE VASC ACCESS LEFT  02/02/2022   IR US GUIDE VASC ACCESS LEFT  03/22/2022   IR US GUIDE VASC ACCESS LEFT  04/15/2022   IR US GUIDE VASC ACCESS LEFT  04/19/2022   IR US GUIDE VASC ACCESS RIGHT  03/26/2021   IR US GUIDE VASC ACCESS RIGHT  05/13/2021   REVISON OF ARTERIOVENOUS FISTULA Left 03/22/2018   Procedure: LEFT UPPER EXTREMITY ARTERIOVENOUS  REVISION WITH GORE-TEX GRAFT.;  Surgeon: Marty Heck, MD;  Location: [facility] OR;  Service: Vascular;  Laterality: Left;   TEE WITHOUT CARDIOVERSION N/A 05/12/2021   Procedure: TRANSESOPHAGEAL ECHOCARDIOGRAM (TEE);  Surgeon: Werner Lean, MD;  [facility]: [facility] ENDOSCOPY;  Service: Cardiovascular;  Laterality: N/A;   THROMBECTOMY Left 08/26/2019   thrombectomy of LUA loop AVG   THROMBECTOMY AND REVISION OF ARTERIOVENTOUS (AV) GORETEX  GRAFT Right 07/27/2020   Procedure: INSERTION OF RIGHT ARM LOOP GRAFT WITH EXCISION OF RIGHT ARM BRACHIAL AXILLARY GRAFT;  Surgeon: Marty Heck, MD;  Location: [place];  Service: Vascular;  Laterality: Right;   UPPER EXTREMITY VENOGRAPHY Bilateral 06/14/2021   Procedure: UPPER EXTREMITY VENOGRAPHY;  Surgeon: Waynetta Sandy, MD;  Location: [place] CV LAB;  Service: Cardiovascular;  Laterality: Bilateral;   VISCERAL ANGIOGRAPHY Right 06/17/2020   Procedure: CENTRAL VENO;  Surgeon: Cherre Robins, MD;  Location: [place] [facility];  Service: Cardiovascular;  Laterality:  Right;    Allergies: Hydralazine hcl  Medications: Prior to Admission medications   Medication Sig Start Date End Date Taking? Authorizing Provider  Accu-Chek Softclix Lancets lancets Use as instructed 06/11/21  Yes Eppie Gibson, MD  acetaminophen (TYLENOL) 500 MG tablet Take 1,000 mg by mouth every 6 (six) hours as needed (for pain.).   Yes [provider]  Blood Glucose Monitoring Suppl (ACCU-CHEK GUIDE ME) w/Device KIT 1 kit by Does not apply route daily. 05/22/21  Yes Eppie Gibson, MD  calcium carbonate (TUMS - DOSED IN MG ELEMENTAL CALCIUM) 500 MG chewable tablet Chew 1,000 mg by mouth daily as needed for indigestion or heartburn.   Yes [provider]  cetirizine (ZYRTEC) 10 MG tablet Take 10 mg by mouth daily as needed for allergies.   Yes [provider]  cinacalcet (SENSIPAR) 30 MG tablet Take 30 mg by mouth daily. 08/12/21  Yes [provider]  ELIQUIS 5 MG TABS tablet Take 1 tablet by mouth twice daily 04/13/22  Yes Dameron, Luna Fuse, DO  ferric citrate (AURYXIA) 1 GM 210 MG(Fe) tablet Take 420-840 mg by mouth See admin instructions. Take 4 tablets (840  mg) by mouth with each meal & take 2 tablets (420 mg) by mouth with each snack.   Yes [provider]  gabapentin (NEURONTIN) 100 MG capsule TAKE 1 CAPSULE BY MOUTH THREE TIMES DAILY Patient taking differently: Take 100 mg by mouth See admin instructions. Take one capsule (100 mg) in the morning and two capsules (200 mg) in the evening. 04/11/22  Yes Waynetta Sandy, MD  glucose blood (ACCU-CHEK GUIDE) test strip 1 each by Other route 2 (two) times daily. Use as instructed 09/22/21  Yes Wells Guiles, DO  LANTUS 100 UNIT/ML injection Inject 0.05 mLs (5 Units total) into the skin 2 (two) times daily. 03/11/22  Yes Dameron, Luna Fuse, DO  levothyroxine (SYNTHROID) 150 MCG tablet TAKE 1 TABLET BY MOUTH ONCE DAILY BEFORE BREAKFAST 04/18/22  Yes Dameron, Luna Fuse, DO  midodrine  (PROAMATINE) 5 MG tablet Take 1 tablet (5 mg total) by mouth 3 (three) times daily with meals. Patient taking differently: Take 5 mg by mouth Every Tuesday,Thursday,and Saturday with dialysis. 05/14/21  Yes Bonnielee Haff, MD  multivitamin (RENA-VIT) TABS tablet Take 1 tablet by mouth at bedtime. 10/25/16  Yes Nicolette Bang, MD  oxyCODONE (ROXICODONE) 5 MG immediate release tablet Take 1 tablet (5 mg total) by mouth every 8 (eight) hours as needed. 04/20/22 04/20/23 Yes Darci Current, DO  rosuvastatin (CRESTOR) 20 MG tablet Take 1 tablet by mouth once daily 01/08/22  Yes Dameron, Luna Fuse, DO  Syringe, Disposable, (B-D SYRINGE SLIP TIP 30CC) 30 ML MISC 1 Syringe by Does not apply route daily. 01/31/19  Yes Wilber Oliphant, MD     Family History  Problem Relation Age of Onset   Heart disease Mother    Hypertension Mother     Social History   Socioeconomic History   Marital status: Single    Spouse name: Not on file   Number of children: Not on file   Years of education: Not on file   Highest education level: Not on file  Occupational History   Not on file  Tobacco Use   Smoking status: Never   Smokeless tobacco: Never  Vaping Use   Vaping Use: Never used  Substance and Sexual Activity   Alcohol use: No   Drug use: Yes    Frequency: 2.0 times per week    Types: Marijuana    Comment: Last use was on 08/17/21   Sexual activity: Yes  Other Topics Concern   Not on file  Social History Narrative   Not on file   Social Determinants of Health   Financial Resource Strain: Not on file  Food Insecurity: No Food Insecurity (04/18/2022)   Hunger Vital Sign    Worried About Running Out of Food in the Last Year: Never true    Ran Out of Food in the Last Year: Never true  Transportation Needs: No Transportation Needs (04/18/2022)   PRAPARE - Hydrologist (Medical): No    Lack of Transportation (Non-Medical): No  Physical Activity: Insufficiently  Active (07/07/2021)   Exercise Vital Sign    Days of Exercise per Week: 4 days    Minutes of Exercise per Session: 30 min  Stress: Stress Concern Present (08/06/2021)   [place]    Feeling of Stress : To some extent  Social Connections: Socially Isolated (06/07/2021)   Social Connection and Isolation Panel [NHANES]    Frequency of Communication with Friends and Family: More than three  times a week    Frequency of Social Gatherings with Friends and Family: More than three times a week    Attends Religious Services: Never    Marine scientist or Organizations: No    Attends Archivist Meetings: Never    Marital Status: Never married    Review of Systems: A 12 point ROS discussed and pertinent positives are indicated in the HPI above.  All other systems are negative.  Review of Systems  Constitutional:  Negative for activity change, fatigue and fever.  Respiratory:  Negative for cough and shortness of breath.   Cardiovascular:  Negative for chest pain.  Gastrointestinal:  Negative for abdominal pain and nausea.  Musculoskeletal:  Negative for back pain.  Psychiatric/Behavioral:  Negative for behavioral problems and confusion.     Vital Signs: There were no vitals taken for this visit.    Physical Exam Vitals reviewed.  HENT:     Mouth/Throat:     Mouth: Mucous membranes are moist.  Cardiovascular:     Rate and Rhythm: Normal rate and regular rhythm.     Heart sounds: Normal heart sounds.  Pulmonary:     Effort: Pulmonary effort is normal.     Breath sounds: Normal breath sounds.  Abdominal:     Palpations: Abdomen is soft.  Musculoskeletal:     Comments: Left upper arm dialysis fistula No thrill; no pulse  Skin:    General: Skin is warm.  Neurological:     Mental Status: He is alert and oriented to person, place, and time.  Psychiatric:        Behavior: Behavior normal.      Imaging: IR Fluoro Guide CV Line Left  Result Date: 04/20/2022 INDICATION: 31 year old gentleman with chronic renal failure on hemodialysis has a thrombosed left upper extremity dialysis graft. He presents to IR today for tunneled HD catheter placement. EXAM: Tunneled left external jugular vein dialysis catheter placement MEDICATIONS: Ancef 2 g IV; The antibiotic was administered within an appropriate time interval prior to skin puncture. ANESTHESIA/SEDATION: Moderate (conscious) sedation was employed during this procedure. A total of Versed 1 mg and Fentanyl 25 mcg was administered intravenously by the radiology nurse. Total intra-service moderate Sedation Time: 32 minutes. The patient's level of consciousness and vital signs were monitored continuously by radiology nursing throughout the procedure under my direct supervision. FLUOROSCOPY: Radiation Exposure Index (as provided by the fluoroscopic device): 914 mGy Kerma COMPLICATIONS: None immediate. PROCEDURE: Informed written consent was obtained from the patient after a thorough discussion of the procedural risks, benefits and alternatives. All questions were addressed. Maximal Sterile Barrier Technique was utilized including caps, mask, sterile gowns, sterile gloves, sterile drape, hand hygiene and skin antiseptic. A timeout was performed prior to the initiation of the procedure. Patient positioned supine on the procedure table. Evaluation of the right neck showed the internal jugular vein to be thrombosed. Sterile ultrasound probe cover and gel were utilized throughout the procedure. An image documenting patency of the right external jugular vein was obtained and placed in permanent medical record. Utilizing continuous ultrasound guidance, the right internal jugular vein was accessed with a 21 gauge needle. Despite multiple attempts, the guidewire could not be advanced centrally due to tortuosity of the vein. The left neck skin was then prepped and  draped usual fashion. Ultrasound evaluation of the left neck showed the internal jugular vein to be thrombosed. The left external jugular vein was patent. Utilizing continuous ultrasound guidance, the external jugular vein was accessed  with a 21 gauge needle. 21 gauge needle exchanged for transitional dilator set over 0.018 inch guidewire. Transitional dilator set removed over 0.035 inch guidewire. The guidewire was advanced to the level of the IVC utilizing fluoroscopic guidance. Serial dilation was performed and peel-away sheath was placed. Following local lidocaine administration, 28 cm hemodialysis catheter was brought through a short subcutaneous tunnel in the left anterior chest wall to the venotomy site and inserted through the peel-away sheath. The tip was positioned within the right atrium. Both lumens aspirated and flushed well and were locked with heparin. Catheter secured to skin with silk suture. IMPRESSION: 1. Tunneled left external jugular vein hemodialysis catheter is ready for use (28 cm). 2. 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 Right external jugular vein is patent, however does not communicate with central venous structures in a pattern which would allow for catheter placement. Electronically Signed   By: Miachel Roux M.D.   On: 04/20/2022 08:05   IR US Guide Vasc Access Left  Result Date: 04/20/2022 INDICATION: 31 year old gentleman with chronic renal failure on hemodialysis has a thrombosed left upper extremity dialysis graft. He presents to IR today for tunneled HD catheter placement. EXAM: Tunneled left external jugular vein dialysis catheter placement MEDICATIONS: Ancef 2 g IV; The antibiotic was administered within an appropriate time interval prior to skin puncture. ANESTHESIA/SEDATION: Moderate (conscious) sedation was employed during this procedure. A total of Versed 1 mg and Fentanyl 25 mcg was administered intravenously by the radiology nurse. Total intra-service moderate  Sedation Time: 32 minutes. The patient's level of consciousness and vital signs were monitored continuously by radiology nursing throughout the procedure under my direct supervision. FLUOROSCOPY: Radiation Exposure Index (as provided by the fluoroscopic device): 720 mGy Kerma COMPLICATIONS: None immediate. PROCEDURE: Informed written consent was obtained from the patient after a thorough discussion of the procedural risks, benefits and alternatives. All questions were addressed. Maximal Sterile Barrier Technique was utilized including caps, mask, sterile gowns, sterile gloves, sterile drape, hand hygiene and skin antiseptic. A timeout was performed prior to the initiation of the procedure. Patient positioned supine on the procedure table. Evaluation of the right neck showed the internal jugular vein to be thrombosed. Sterile ultrasound probe cover and gel were utilized throughout the procedure. An image documenting patency of the right external jugular vein was obtained and placed in permanent medical record. Utilizing continuous ultrasound guidance, the right internal jugular vein was accessed with a 21 gauge needle. Despite multiple attempts, the guidewire could not be advanced centrally due to tortuosity of the vein. The left neck skin was then prepped and draped usual fashion. Ultrasound evaluation of the left neck showed the internal jugular vein to be thrombosed. The left external jugular vein was patent. Utilizing continuous ultrasound guidance, the external jugular vein was accessed with a 21 gauge needle. 21 gauge needle exchanged for transitional dilator set over 0.018 inch guidewire. Transitional dilator set removed over 0.035 inch guidewire. The guidewire was advanced to the level of the IVC utilizing fluoroscopic guidance. Serial dilation was performed and peel-away sheath was placed. Following local lidocaine administration, 28 cm hemodialysis catheter was brought through a short subcutaneous tunnel in  the left anterior chest wall to the venotomy site and inserted through the peel-away sheath. The tip was positioned within the right atrium. Both lumens aspirated and flushed well and were locked with heparin. Catheter secured to skin with silk suture. IMPRESSION: 1. Tunneled left external jugular vein hemodialysis catheter is ready for use (28 cm). 2. Bilateral  internal jugular veins are occluded. Right external jugular vein is patent, however does not communicate with central venous structures in a pattern which would allow for catheter placement. Electronically Signed   By: Miachel Roux M.D.   On: 04/20/2022 08:05   DG Chest 2 View  Result Date: 04/18/2022 CLINICAL DATA:  Shortness of breath for 1-2 days. History of diabetes and end-stage renal disease. EXAM: CHEST - 2 VIEW COMPARISON:  Radiographs 05/06/2021 and 03/22/2018. FINDINGS: Hemodialysis catheter has been removed. A vascular stent is noted within the right axilla. The heart size and mediastinal contours are normal. The lungs are clear. There is no pleural effusion or pneumothorax. No acute osseous findings are identified. IMPRESSION: No active cardiopulmonary process. Electronically Signed   By: Richardean Sale M.D.   On: 04/18/2022 10:38   IR DIALY SHUNT INTRO NEEDLE/INTRACATH INITIAL W/IMG LEFT  Result Date: 04/15/2022 INDICATION: 31 year old with end-stage renal disease. History of brachial artery to basilic vein fistula with placement of interposition graft to the axillary vein. Patient underwent AV fistula declot procedures on 02/02/2022 and 03/22/2022. Patient now presents with abnormal flows. EXAM: 1. Left upper extremity fistulogram 2. Ultrasound guidance for vascular access 3. Unsuccessful recanalization of the occluded outflow vein MEDICATIONS: Moderate sedation ANESTHESIA/SEDATION: Moderate (conscious) sedation was employed during this procedure. A total of Versed 0.60m and fentanyl 25 mcg was administered intravenously at the order  of the provider performing the procedure. Total intra-service moderate sedation time: 45 minutes. Patient's level of consciousness and vital signs were monitored continuously by radiology nurse throughout the procedure under the supervision of the provider performing the procedure. FLUOROSCOPY: Radiation Exposure Index (as provided by the fluoroscopic device): 124 mGy Kerma CONTRAST:  50 mL Omnipaque 3161COMPLICATIONS: None immediate. PROCEDURE: The procedure was explained to the patient. The risks and benefits of the procedure were discussed and the patient's questions were addressed. Informed consent was obtained from the patient. The left upper arm was prepped and draped in sterile fashion. Ultrasound confirmed a patent left AV fistula. Skin was anesthetized with 1% lidocaine. Using ultrasound guidance, an Angiocath was directed into the AV fistula and fistulogram images were obtained. Bentson wire was advanced and the Angiocath was exchanged for a 6 [place] sheath. Five French catheter was advanced into the outflow vein and additional fistulogram images were obtained. Multiple attempts were made to cannulate the occluded outflow vein segment. These attempts to cannulate this vein were unsuccessful. Final fistulogram images were obtained. The vascular sheath was removed with a pursestring suture. Bandage was placed. FINDINGS: The AV fistula/graft was patent. However, there was a short segment occlusion of the outflow vein near the venous anastomosis and the AV fistula is draining through venous collateral flow. Could not advance a catheter or wire into the occluded segment and could not recanalize this occluded segment. Central veins are patent. Arterial anastomosis is patent but there may be at least mild narrowing at the anastomosis. IMPRESSION: 1. Left upper extremity fistula/graft is patent but there is a short segment occlusion of the outflow vein in the upper arm. The AV fistula is draining through  collateral venous flow. Unsuccessful recanalization of this occluded outflow vein. Recommend surgical consultation. 2. Probable stenosis near the arterial anastomosis. 3. Recommended placement of a tunneled dialysis catheter but the patient declined catheter placement at this time. Electronically Signed   By: AMarkus DaftM.D.   On: 04/15/2022 18:43   IR [place] Vasc Access Left  Result Date: 04/15/2022 INDICATION: 31year old with end-stage  renal disease. History of brachial artery to basilic vein fistula with placement of interposition graft to the axillary vein. Patient underwent AV fistula declot procedures on 02/02/2022 and 03/22/2022. Patient now presents with abnormal flows. EXAM: 1. Left upper extremity fistulogram 2. Ultrasound guidance for vascular access 3. Unsuccessful recanalization of the occluded outflow vein MEDICATIONS: Moderate sedation ANESTHESIA/SEDATION: Moderate (conscious) sedation was employed during this procedure. A total of Versed 0.71m and fentanyl 25 mcg was administered intravenously at the order of the provider performing the procedure. Total intra-service moderate sedation time: 45 minutes. Patient's level of consciousness and vital signs were monitored continuously by radiology nurse throughout the procedure under the supervision of the provider performing the procedure. FLUOROSCOPY: Radiation Exposure Index (as provided by the fluoroscopic device): 124 mGy Kerma CONTRAST:  50 mL Omnipaque [id number]: None immediate. PROCEDURE: The procedure was explained to the patient. The risks and benefits of the procedure were discussed and the patient's questions were addressed. Informed consent was obtained from the patient. The left upper arm was prepped and draped in sterile fashion. Ultrasound confirmed a patent left AV fistula. Skin was anesthetized with 1% lidocaine. Using ultrasound guidance, an Angiocath was directed into the AV fistula and fistulogram images were obtained.  Bentson wire was advanced and the Angiocath was exchanged for a 6 [place] sheath. Five French catheter was advanced into the outflow vein and additional fistulogram images were obtained. Multiple attempts were made to cannulate the occluded outflow vein segment. These attempts to cannulate this vein were unsuccessful. Final fistulogram images were obtained. The vascular sheath was removed with a pursestring suture. Bandage was placed. FINDINGS: The AV fistula/graft was patent. However, there was a short segment occlusion of the outflow vein near the venous anastomosis and the AV fistula is draining through venous collateral flow. Could not advance a catheter or wire into the occluded segment and could not recanalize this occluded segment. Central veins are patent. Arterial anastomosis is patent but there may be at least mild narrowing at the anastomosis. IMPRESSION: 1. Left upper extremity fistula/graft is patent but there is a short segment occlusion of the outflow vein in the upper arm. The AV fistula is draining through collateral venous flow. Unsuccessful recanalization of this occluded outflow vein. Recommend surgical consultation. 2. Probable stenosis near the arterial anastomosis. 3. Recommended placement of a tunneled dialysis catheter but the patient declined catheter placement at this time. Electronically Signed   By: AMarkus DaftM.D.   On: 04/15/2022 18:43    Labs:  CBC: Recent Labs    01/31/22 1230 04/18/22 1014 04/19/22 0901 04/20/22 0608  WBC 11.6* 8.9 7.8 8.4  HGB 13.6 13.8 13.2 13.5  HCT 42.0 42.4 42.0 42.1  PLT 302 269 247 251    COAGS: Recent Labs    05/06/21 1600  INR 1.3*  APTT 34    BMP: Recent Labs    03/22/22 0744 04/18/22 1014 04/19/22 0901 04/20/22 0608  NA 139 139 140 136  K 4.3 5.0 6.5* 4.4  CL 99 102 100 94*  CO2 23 17* 16* 23  GLUCOSE 121* 96 102* 128*  BUN 51* 81* 91* 52*  CALCIUM 8.3* 8.3* 8.3* 8.4*  CREATININE 19.37* 25.22* 27.99* 17.74*   GFRNONAA 3* 2* 2* 3*    LIVER FUNCTION TESTS: Recent Labs    05/06/21 1600 05/11/21 0824 05/14/21 0611 04/18/22 1014 04/19/22 0901 04/20/22 0608  BILITOT 0.7  --   --  0.7 0.7  --   AST 21  --   --  12* 15  --   ALT 12  --   --  13 13  --   ALKPHOS 69  --   --  51 51  --   PROT 9.8*  --   --  7.6 7.4  --   ALBUMIN 4.3   < > 3.3* 4.1 3.9 4.2   < > = values in this interval not displayed.    TUMOR MARKERS: No results for input(s): "AFPTM", "CEA", "CA199", "CHROMGRNA" in the last 8760 hours.  Assessment and Plan:  ESRD Clotted left upper arm dialysis fistula Existing left ext jugular tunn HD cath--- using well Scheduled for evaluation of left arm dialysis fistula with possible intervention Risks and benefits discussed with the patient including, but not limited to bleeding, infection, vascular injury, pulmonary embolism, or even death.  All of the patient's questions were answered, patient is agreeable to proceed. Consent signed and in chart.  Thank you for this interesting consult.  I greatly enjoyed meeting [facility] and look forward to participating in their care.  A copy of this report was sent to the requesting provider on this date.  Electronically Signed: Lavonia Drafts, PA-C 04/27/2022, 1:03 PM   I spent a total of    25 Minutes in face to face in clinical consultation, greater than 50% of which was counseling/coordinating care for left upper arm dialysis fistula intervention

## 2022-04-27 NOTE — Procedures (Signed)
Interventional Radiology Procedure Note  Procedure: Left upper extremity graft declot  Indication: Thrombosed left upper upper extremity graft  Findings: Please refer to procedural dictation for full description.  Complications: None  EBL: < 10 mL  Miachel Roux, MD [phone]

## 2022-04-27 NOTE — Sedation Documentation (Signed)
Angioplasty via Anatomosis sheath being performed at this time at discretion of MD.

## 2022-04-27 NOTE — Sedation Documentation (Signed)
Per MD, "we have flow" at this time.  Pt remains to appear in NAD and tolerating procedure well.

## 2022-04-27 NOTE — Sedation Documentation (Signed)
Stent deployment and angioplasty being performed at this time at MD discretion.  Pt tolerating well and remains to appear in NAD at this time.  No complaints/concerns voiced by pt at this time.

## 2022-04-27 NOTE — Sedation Documentation (Signed)
Per MD, access is "flowing" at this time.

## 2022-04-27 NOTE — Sedation Documentation (Signed)
MD advised pt that graft was "flowing but there's not enough flow to keep it from clotting off".

## 2022-04-27 NOTE — Sedation Documentation (Addendum)
Performing Angioplasty at this time at MD discretion. Pt tolerating well and remains to appear in NAD at this time.

## 2022-04-27 NOTE — Sedation Documentation (Signed)
Angioplasty being performed at discretion of MD at this time.  Pt tolerating well and remains to appear in NAD at this time.

## 2022-04-28 ENCOUNTER — Other Ambulatory Visit (HOSPITAL_COMMUNITY): Payer: Self-pay | Admitting: Interventional Radiology

## 2022-04-28 DIAGNOSIS — N186 End stage renal disease: Secondary | ICD-10-CM

## 2022-04-28 DIAGNOSIS — Z992 Dependence on renal dialysis: Secondary | ICD-10-CM

## 2022-04-29 ENCOUNTER — Ambulatory Visit: Payer: Self-pay | Admitting: Family Medicine

## 2022-05-03 ENCOUNTER — Other Ambulatory Visit: Payer: Self-pay | Admitting: Student

## 2022-05-03 DIAGNOSIS — N186 End stage renal disease: Secondary | ICD-10-CM

## 2022-05-04 ENCOUNTER — Other Ambulatory Visit: Payer: Self-pay

## 2022-05-04 ENCOUNTER — Inpatient Hospital Stay (HOSPITAL_BASED_OUTPATIENT_CLINIC_OR_DEPARTMENT_OTHER): Payer: Medicaid Other | Admitting: Anesthesiology

## 2022-05-04 ENCOUNTER — Observation Stay (HOSPITAL_COMMUNITY)
Admission: RE | Admit: 2022-05-04 | Discharge: 2022-05-05 | Disposition: A | Discharge status: Home / Self Care | Payer: Medicaid Other | Attending: Family Medicine | Admitting: Family Medicine

## 2022-05-04 ENCOUNTER — Encounter (HOSPITAL_COMMUNITY): Payer: Self-pay | Admitting: Vascular Surgery

## 2022-05-04 ENCOUNTER — Encounter (HOSPITAL_COMMUNITY)
Admission: RE | Disposition: A | Discharge status: Home / Self Care | Payer: Self-pay | Source: Home / Self Care | Attending: Family Medicine

## 2022-05-04 ENCOUNTER — Other Ambulatory Visit (HOSPITAL_COMMUNITY): Payer: Self-pay | Admitting: Interventional Radiology

## 2022-05-04 ENCOUNTER — Inpatient Hospital Stay (HOSPITAL_COMMUNITY): Payer: Medicaid Other | Admitting: Anesthesiology

## 2022-05-04 ENCOUNTER — Ambulatory Visit (HOSPITAL_COMMUNITY)
Admission: RE | Admit: 2022-05-04 | Discharge: 2022-05-04 | Disposition: A | Discharge status: Home / Self Care | Payer: Medicaid Other | Source: Ambulatory Visit | Attending: Interventional Radiology | Admitting: Interventional Radiology

## 2022-05-04 ENCOUNTER — Inpatient Hospital Stay (HOSPITAL_COMMUNITY): Payer: Medicaid Other

## 2022-05-04 DIAGNOSIS — I70208 Unspecified atherosclerosis of native arteries of extremities, other extremity: Principal | ICD-10-CM | POA: Diagnosis present

## 2022-05-04 DIAGNOSIS — Z794 Long term (current) use of insulin: Secondary | ICD-10-CM | POA: Insufficient documentation

## 2022-05-04 DIAGNOSIS — Y841 Kidney dialysis as the cause of abnormal reaction of the patient, or of later complication, without mention of misadventure at the time of the procedure: Secondary | ICD-10-CM | POA: Insufficient documentation

## 2022-05-04 DIAGNOSIS — T82868A Thrombosis of vascular prosthetic devices, implants and grafts, initial encounter: Secondary | ICD-10-CM | POA: Insufficient documentation

## 2022-05-04 DIAGNOSIS — E1122 Type 2 diabetes mellitus with diabetic chronic kidney disease: Secondary | ICD-10-CM | POA: Insufficient documentation

## 2022-05-04 DIAGNOSIS — Z79899 Other long term (current) drug therapy: Secondary | ICD-10-CM | POA: Insufficient documentation

## 2022-05-04 DIAGNOSIS — N186 End stage renal disease: Secondary | ICD-10-CM | POA: Insufficient documentation

## 2022-05-04 DIAGNOSIS — Z79890 Hormone replacement therapy (postmenopausal): Secondary | ICD-10-CM | POA: Diagnosis not present

## 2022-05-04 DIAGNOSIS — Z992 Dependence on renal dialysis: Secondary | ICD-10-CM | POA: Insufficient documentation

## 2022-05-04 DIAGNOSIS — D631 Anemia in chronic kidney disease: Secondary | ICD-10-CM

## 2022-05-04 DIAGNOSIS — I742 Embolism and thrombosis of arteries of the upper extremities: Principal | ICD-10-CM | POA: Insufficient documentation

## 2022-05-04 DIAGNOSIS — E039 Hypothyroidism, unspecified: Secondary | ICD-10-CM | POA: Insufficient documentation

## 2022-05-04 DIAGNOSIS — Z7982 Long term (current) use of aspirin: Secondary | ICD-10-CM | POA: Insufficient documentation

## 2022-05-04 DIAGNOSIS — I998 Other disorder of circulatory system: Secondary | ICD-10-CM

## 2022-05-04 DIAGNOSIS — K219 Gastro-esophageal reflux disease without esophagitis: Secondary | ICD-10-CM | POA: Diagnosis not present

## 2022-05-04 DIAGNOSIS — Z7901 Long term (current) use of anticoagulants: Secondary | ICD-10-CM | POA: Diagnosis not present

## 2022-05-04 DIAGNOSIS — E785 Hyperlipidemia, unspecified: Secondary | ICD-10-CM | POA: Diagnosis not present

## 2022-05-04 DIAGNOSIS — I120 Hypertensive chronic kidney disease with stage 5 chronic kidney disease or end stage renal disease: Secondary | ICD-10-CM | POA: Insufficient documentation

## 2022-05-04 DIAGNOSIS — E119 Type 2 diabetes mellitus without complications: Secondary | ICD-10-CM

## 2022-05-04 DIAGNOSIS — I8290 Acute embolism and thrombosis of unspecified vein: Secondary | ICD-10-CM | POA: Insufficient documentation

## 2022-05-04 HISTORY — PX: THROMBECTOMY BRACHIAL ARTERY: SHX6649

## 2022-05-04 HISTORY — PX: INTRAOPERATIVE ARTERIOGRAM: SHX5157

## 2022-05-04 HISTORY — PX: IR FLUORO GUIDE CV LINE RIGHT: IMG2283

## 2022-05-04 HISTORY — PX: IR THROMBECTOMY AV FISTULA W/THROMBOLYSIS/PTA INC/SHUNT/IMG LEFT: IMG6106

## 2022-05-04 HISTORY — PX: IR US GUIDE VASC ACCESS RIGHT: IMG2390

## 2022-05-04 HISTORY — PX: IR THROMBECTOMY AV FISTULA W/THROMBOLYSIS INC/SHUNT/IMG LEFT: IMG6105

## 2022-05-04 LAB — POCT I-STAT, CHEM 8
BUN: 43 mg/dL — ABNORMAL HIGH (ref 6–20)
Calcium, Ion: 1.07 mmol/L — ABNORMAL LOW (ref 1.15–1.40)
Chloride: 102 mmol/L (ref 98–111)
Creatinine, Ser: 14.8 mg/dL — ABNORMAL HIGH (ref 0.61–1.24)
Glucose, Bld: 102 mg/dL — ABNORMAL HIGH (ref 70–99)
HCT: 43 % (ref 39.0–52.0)
Hemoglobin: 14.6 g/dL (ref 13.0–17.0)
Potassium: 4.4 mmol/L (ref 3.5–5.1)
Sodium: 140 mmol/L (ref 135–145)
TCO2: 25 mmol/L (ref 22–32)

## 2022-05-04 LAB — GLUCOSE, CAPILLARY
Glucose-Capillary: 149 mg/dL — ABNORMAL HIGH (ref 70–99)
Glucose-Capillary: 136 mg/dL — ABNORMAL HIGH (ref 70–99)
Glucose-Capillary: 154 mg/dL — ABNORMAL HIGH (ref 70–99)

## 2022-05-04 LAB — TYPE AND SCREEN
ABO/RH(D): O POS
Antibody Screen: NEGATIVE

## 2022-05-04 LAB — POCT ACTIVATED CLOTTING TIME
Activated Clotting Time: 190 seconds
Activated Clotting Time: 223 seconds

## 2022-05-04 SURGERY — THROMBECTOMY, ARTERY, BRACHIAL
Anesthesia: General | Site: Arm Upper | Laterality: Left

## 2022-05-04 MED ORDER — MIDAZOLAM HCL 2 MG/2ML IJ SOLN
INTRAMUSCULAR | Status: AC
  Filled 2022-05-04: qty 2

## 2022-05-04 MED ORDER — LEVOTHYROXINE SODIUM 75 MCG PO TABS
150.0000 ug | ORAL_TABLET | Freq: Every day | ORAL | Status: DC
  Administered 2022-05-05: 150 ug via ORAL
  Filled 2022-05-04: qty 2

## 2022-05-04 MED ORDER — DIPHENHYDRAMINE HCL 50 MG/ML IJ SOLN
INTRAMUSCULAR | Status: AC
  Filled 2022-05-04: qty 1

## 2022-05-04 MED ORDER — LIDOCAINE 2% (20 MG/ML) 5 ML SYRINGE
INTRAMUSCULAR | Status: AC
  Filled 2022-05-04: qty 5

## 2022-05-04 MED ORDER — INSULIN ASPART 100 UNIT/ML IJ SOLN
0.0000 [IU] | INTRAMUSCULAR | Status: DC
  Administered 2022-05-04: 1 [IU] via SUBCUTANEOUS
  Administered 2022-05-05: 2 [IU] via SUBCUTANEOUS
  Administered 2022-05-05 (×2): 1 [IU] via SUBCUTANEOUS
  Administered 2022-05-05: 4 [IU] via SUBCUTANEOUS

## 2022-05-04 MED ORDER — DEXAMETHASONE SODIUM PHOSPHATE 10 MG/ML IJ SOLN
INTRAMUSCULAR | Status: AC
  Filled 2022-05-04: qty 2

## 2022-05-04 MED ORDER — EPINEPHRINE 1 MG/10ML IJ SOSY
PREFILLED_SYRINGE | INTRAMUSCULAR | Status: AC
  Filled 2022-05-04: qty 10

## 2022-05-04 MED ORDER — PROTAMINE SULFATE 10 MG/ML IV SOLN
INTRAVENOUS | Status: DC | PRN
  Administered 2022-05-04: 30 mg via INTRAVENOUS

## 2022-05-04 MED ORDER — RENA-VITE PO TABS
1.0000 | ORAL_TABLET | Freq: Every day | ORAL | Status: DC
  Administered 2022-05-04 – 2022-05-05 (×2): 1 via ORAL
  Filled 2022-05-04 (×2): qty 1

## 2022-05-04 MED ORDER — ROCURONIUM BROMIDE 10 MG/ML (PF) SYRINGE
PREFILLED_SYRINGE | INTRAVENOUS | Status: AC
  Filled 2022-05-04: qty 10

## 2022-05-04 MED ORDER — LORATADINE 10 MG PO TABS: 10.0000 mg | ORAL_TABLET | Freq: Every day | ORAL | Status: DC | PRN

## 2022-05-04 MED ORDER — FENTANYL CITRATE (PF) 100 MCG/2ML IJ SOLN
INTRAMUSCULAR | Status: AC
  Filled 2022-05-04: qty 2

## 2022-05-04 MED ORDER — CINACALCET HCL 30 MG PO TABS
30.0000 mg | ORAL_TABLET | Freq: Every day | ORAL | Status: DC
  Filled 2022-05-04: qty 1

## 2022-05-04 MED ORDER — PHENYLEPHRINE 80 MCG/ML (10ML) SYRINGE FOR IV PUSH (FOR BLOOD PRESSURE SUPPORT)
PREFILLED_SYRINGE | INTRAVENOUS | Status: DC | PRN
  Administered 2022-05-04: 240 ug via INTRAVENOUS

## 2022-05-04 MED ORDER — ACETAMINOPHEN 10 MG/ML IV SOLN
INTRAVENOUS | Status: AC
  Filled 2022-05-04: qty 100

## 2022-05-04 MED ORDER — FENTANYL CITRATE (PF) 250 MCG/5ML IJ SOLN
INTRAMUSCULAR | Status: AC
  Filled 2022-05-04: qty 5

## 2022-05-04 MED ORDER — SUCCINYLCHOLINE CHLORIDE 200 MG/10ML IV SOSY
PREFILLED_SYRINGE | INTRAVENOUS | Status: AC
  Filled 2022-05-04: qty 10

## 2022-05-04 MED ORDER — MIDAZOLAM HCL 2 MG/2ML IJ SOLN
INTRAMUSCULAR | Status: AC
  Filled 2022-05-04: qty 4

## 2022-05-04 MED ORDER — SUGAMMADEX SODIUM 200 MG/2ML IV SOLN
INTRAVENOUS | Status: DC | PRN
  Administered 2022-05-04: 300 mg via INTRAVENOUS

## 2022-05-04 MED ORDER — ACETAMINOPHEN 10 MG/ML IV SOLN
1000.0000 mg | Freq: Once | INTRAVENOUS | Status: DC | PRN
  Administered 2022-05-04: 1000 mg via INTRAVENOUS

## 2022-05-04 MED ORDER — FERRIC CITRATE 1 GM 210 MG(FE) PO TABS
840.0000 mg | ORAL_TABLET | Freq: Three times a day (TID) | ORAL | Status: DC
  Administered 2022-05-05 (×3): 840 mg via ORAL
  Filled 2022-05-04 (×3): qty 4

## 2022-05-04 MED ORDER — ROSUVASTATIN CALCIUM 20 MG PO TABS
20.0000 mg | ORAL_TABLET | Freq: Every day | ORAL | Status: DC
  Filled 2022-05-04: qty 1

## 2022-05-04 MED ORDER — APIXABAN 5 MG PO TABS
5.0000 mg | ORAL_TABLET | Freq: Two times a day (BID) | ORAL | Status: DC
  Administered 2022-05-05 (×2): 5 mg via ORAL
  Filled 2022-05-04 (×2): qty 1

## 2022-05-04 MED ORDER — PHENYLEPHRINE HCL-NACL 20-0.9 MG/250ML-% IV SOLN
INTRAVENOUS | Status: DC | PRN
  Administered 2022-05-04: 50 ug/min via INTRAVENOUS

## 2022-05-04 MED ORDER — HEMOSTATIC AGENTS (NO CHARGE) OPTIME
TOPICAL | Status: DC | PRN
  Administered 2022-05-04: 1 via TOPICAL

## 2022-05-04 MED ORDER — CALCIUM CARBONATE ANTACID 500 MG PO CHEW: 1000.0000 mg | CHEWABLE_TABLET | Freq: Every day | ORAL | Status: DC | PRN

## 2022-05-04 MED ORDER — FENTANYL CITRATE (PF) 100 MCG/2ML IJ SOLN: INTRAMUSCULAR | Status: DC | PRN

## 2022-05-04 MED ORDER — MIDAZOLAM HCL 5 MG/5ML IJ SOLN: INTRAMUSCULAR | Status: AC | PRN

## 2022-05-04 MED ORDER — DEXTROSE 5 % IV SOLN
INTRAVENOUS | Status: DC | PRN
  Administered 2022-05-04: 3 g via INTRAVENOUS

## 2022-05-04 MED ORDER — HEPARIN 6000 UNIT IRRIGATION SOLUTION
Status: AC
  Filled 2022-05-04: qty 500

## 2022-05-04 MED ORDER — HEPARIN SODIUM (PORCINE) 1000 UNIT/ML IJ SOLN
INTRAMUSCULAR | Status: AC
  Filled 2022-05-04: qty 10

## 2022-05-04 MED ORDER — ALTEPLASE 2 MG IJ SOLR
INTRAMUSCULAR | Status: AC
  Filled 2022-05-04: qty 2

## 2022-05-04 MED ORDER — LIDOCAINE 2% (20 MG/ML) 5 ML SYRINGE
INTRAMUSCULAR | Status: DC | PRN
  Administered 2022-05-04: 80 mg via INTRAVENOUS

## 2022-05-04 MED ORDER — MIDAZOLAM HCL 2 MG/2ML IJ SOLN
INTRAMUSCULAR | Status: AC | PRN
  Administered 2022-05-04: .5 mg via INTRAVENOUS
  Administered 2022-05-04: 1 mg via INTRAVENOUS
  Administered 2022-05-04 (×5): .5 mg via INTRAVENOUS

## 2022-05-04 MED ORDER — AMISULPRIDE (ANTIEMETIC) 5 MG/2ML IV SOLN
INTRAVENOUS | Status: AC
  Filled 2022-05-04: qty 4

## 2022-05-04 MED ORDER — ALTEPLASE 2 MG IJ SOLR
INTRAMUSCULAR | Status: AC | PRN
  Administered 2022-05-04 (×3): 2 mg

## 2022-05-04 MED ORDER — LIDOCAINE HCL 1 % IJ SOLN
INTRAMUSCULAR | Status: AC
  Filled 2022-05-04: qty 20

## 2022-05-04 MED ORDER — ASPIRIN 81 MG PO TBEC
81.0000 mg | DELAYED_RELEASE_TABLET | Freq: Every day | ORAL | Status: DC
  Administered 2022-05-04: 81 mg via ORAL
  Filled 2022-05-04 (×2): qty 1

## 2022-05-04 MED ORDER — FENTANYL CITRATE (PF) 100 MCG/2ML IJ SOLN
INTRAMUSCULAR | Status: AC
  Filled 2022-05-04: qty 4

## 2022-05-04 MED ORDER — ONDANSETRON HCL 4 MG/2ML IJ SOLN
INTRAMUSCULAR | Status: DC | PRN
  Administered 2022-05-04: 4 mg via INTRAVENOUS

## 2022-05-04 MED ORDER — PHENYLEPHRINE 80 MCG/ML (10ML) SYRINGE FOR IV PUSH (FOR BLOOD PRESSURE SUPPORT)
PREFILLED_SYRINGE | INTRAVENOUS | Status: AC
  Filled 2022-05-04: qty 10

## 2022-05-04 MED ORDER — ACETAMINOPHEN 325 MG PO TABS
650.0000 mg | ORAL_TABLET | Freq: Four times a day (QID) | ORAL | Status: DC
  Administered 2022-05-04 – 2022-05-05 (×4): 650 mg via ORAL
  Filled 2022-05-04 (×5): qty 2

## 2022-05-04 MED ORDER — ROCURONIUM BROMIDE 100 MG/10ML IV SOLN: INTRAVENOUS | Status: DC | PRN

## 2022-05-04 MED ORDER — FENTANYL CITRATE (PF) 100 MCG/2ML IJ SOLN
INTRAMUSCULAR | Status: AC | PRN
  Administered 2022-05-04 (×8): 25 ug via INTRAVENOUS

## 2022-05-04 MED ORDER — SODIUM CHLORIDE 0.9 % IV SOLN: INTRAVENOUS | Status: DC

## 2022-05-04 MED ORDER — PROPOFOL 10 MG/ML IV BOLUS
INTRAVENOUS | Status: DC | PRN
  Administered 2022-05-04: 180 mg via INTRAVENOUS

## 2022-05-04 MED ORDER — MIDAZOLAM HCL 2 MG/2ML IJ SOLN
INTRAMUSCULAR | Status: DC | PRN
  Administered 2022-05-04: 2 mg via INTRAVENOUS

## 2022-05-04 MED ORDER — ALBUMIN HUMAN 5 % IV SOLN: INTRAVENOUS | Status: DC | PRN

## 2022-05-04 MED ORDER — ALTEPLASE 2 MG IJ SOLR
INTRAMUSCULAR | Status: AC
  Filled 2022-05-04: qty 4

## 2022-05-04 MED ORDER — PROPOFOL 10 MG/ML IV BOLUS
INTRAVENOUS | Status: AC
  Filled 2022-05-04: qty 20

## 2022-05-04 MED ORDER — OXYCODONE HCL 5 MG PO TABS
5.0000 mg | ORAL_TABLET | Freq: Three times a day (TID) | ORAL | Status: DC | PRN
  Administered 2022-05-04 – 2022-05-05 (×2): 5 mg via ORAL
  Filled 2022-05-04 (×3): qty 1

## 2022-05-04 MED ORDER — FERRIC CITRATE 1 GM 210 MG(FE) PO TABS: 420.0000 mg | ORAL_TABLET | Freq: Two times a day (BID) | ORAL | Status: DC | PRN

## 2022-05-04 MED ORDER — SUCCINYLCHOLINE CHLORIDE 200 MG/10ML IV SOSY
PREFILLED_SYRINGE | INTRAVENOUS | Status: DC | PRN
  Administered 2022-05-04: 180 mg via INTRAVENOUS

## 2022-05-04 MED ORDER — PAPAVERINE HCL 30 MG/ML IJ SOLN
INTRAMUSCULAR | Status: AC
  Filled 2022-05-04: qty 2

## 2022-05-04 MED ORDER — HEPARIN 6000 UNIT IRRIGATION SOLUTION
Status: DC | PRN
  Administered 2022-05-04: 1

## 2022-05-04 MED ORDER — MIDODRINE HCL 5 MG PO TABS
5.0000 mg | ORAL_TABLET | Freq: Once | ORAL | Status: AC
  Administered 2022-05-04: 5 mg via ORAL
  Filled 2022-05-04 (×4): qty 1

## 2022-05-04 MED ORDER — FENTANYL CITRATE (PF) 100 MCG/2ML IJ SOLN
25.0000 ug | INTRAMUSCULAR | Status: DC | PRN
  Administered 2022-05-04: 50 ug via INTRAVENOUS

## 2022-05-04 MED ORDER — LIDOCAINE HCL (CARDIAC) PF 100 MG/5ML IV SOSY: PREFILLED_SYRINGE | INTRAVENOUS | Status: DC | PRN

## 2022-05-04 MED ORDER — PROTAMINE SULFATE 10 MG/ML IV SOLN
INTRAVENOUS | Status: AC
  Filled 2022-05-04: qty 10

## 2022-05-04 MED ORDER — DEXAMETHASONE SODIUM PHOSPHATE 10 MG/ML IJ SOLN
INTRAMUSCULAR | Status: DC | PRN
  Administered 2022-05-04: 8 mg via INTRAVENOUS

## 2022-05-04 MED ORDER — ONDANSETRON HCL 4 MG/2ML IJ SOLN
INTRAMUSCULAR | Status: AC
  Filled 2022-05-04: qty 4

## 2022-05-04 MED ORDER — SUGAMMADEX SODIUM 500 MG/5ML IV SOLN
INTRAVENOUS | Status: AC
  Filled 2022-05-04: qty 5

## 2022-05-04 MED ORDER — SENNOSIDES-DOCUSATE SODIUM 8.6-50 MG PO TABS: 1.0000 | ORAL_TABLET | Freq: Every evening | ORAL | Status: DC | PRN

## 2022-05-04 MED ORDER — FERRIC CITRATE 1 GM 210 MG(FE) PO TABS: 420.0000 mg | ORAL_TABLET | ORAL | Status: DC

## 2022-05-04 MED ORDER — HEPARIN SODIUM (PORCINE) 1000 UNIT/ML IJ SOLN
INTRAMUSCULAR | Status: DC | PRN
  Administered 2022-05-04: 3000 [IU] via INTRAVENOUS
  Administered 2022-05-04: 5000 [IU] via INTRAVENOUS

## 2022-05-04 MED ORDER — IODIXANOL 320 MG/ML IV SOLN
INTRAVENOUS | Status: DC | PRN
  Administered 2022-05-04: 25 mL via INTRA_ARTERIAL

## 2022-05-04 MED ORDER — FENTANYL CITRATE (PF) 250 MCG/5ML IJ SOLN
INTRAMUSCULAR | Status: DC | PRN
  Administered 2022-05-04: 100 ug via INTRAVENOUS
  Administered 2022-05-04: 50 ug via INTRAVENOUS

## 2022-05-04 MED ORDER — IOHEXOL 300 MG/ML  SOLN
100.0000 mL | Freq: Once | INTRAMUSCULAR | Status: AC | PRN
  Administered 2022-05-04: 50 mL via INTRA_ARTERIAL

## 2022-05-04 MED ORDER — CHLORHEXIDINE GLUCONATE CLOTH 2 % EX PADS
6.0000 | MEDICATED_PAD | Freq: Every day | CUTANEOUS | Status: DC
  Administered 2022-05-05: 6 via TOPICAL

## 2022-05-04 MED ORDER — ROCURONIUM BROMIDE 10 MG/ML (PF) SYRINGE
PREFILLED_SYRINGE | INTRAVENOUS | Status: DC | PRN
  Administered 2022-05-04: 60 mg via INTRAVENOUS

## 2022-05-04 MED ORDER — HEPARIN SODIUM (PORCINE) 1000 UNIT/ML IJ SOLN
INTRAMUSCULAR | Status: AC
  Filled 2022-05-04: qty 30

## 2022-05-04 MED ORDER — HEPARIN SODIUM (PORCINE) 1000 UNIT/ML IJ SOLN
INTRAMUSCULAR | Status: AC | PRN
  Administered 2022-05-04: 5000 [IU] via INTRAVENOUS
  Administered 2022-05-04: 1000 [IU] via INTRAVENOUS
  Administered 2022-05-04: 8000 [IU] via INTRAVENOUS

## 2022-05-04 MED ORDER — 0.9 % SODIUM CHLORIDE (POUR BTL) OPTIME
TOPICAL | Status: DC | PRN
  Administered 2022-05-04: 1000 mL

## 2022-05-04 MED ORDER — CEFAZOLIN IN SODIUM CHLORIDE 3-0.9 GM/100ML-% IV SOLN
INTRAVENOUS | Status: AC
  Filled 2022-05-04: qty 100

## 2022-05-04 MED ORDER — AMISULPRIDE (ANTIEMETIC) 5 MG/2ML IV SOLN
10.0000 mg | Freq: Once | INTRAVENOUS | Status: AC
  Administered 2022-05-04: 10 mg via INTRAVENOUS

## 2022-05-04 SURGICAL SUPPLY — 42 items
ARMBAND PINK RESTRICT EXTREMIT (MISCELLANEOUS) ×1 IMPLANT
BNDG ELASTIC 4X5.8 VLCR STR LF (GAUZE/BANDAGES/DRESSINGS) IMPLANT
CANISTER SUCT 1200ML W/VALVE (MISCELLANEOUS) ×1 IMPLANT
CATH EMB 2FR 60CM (CATHETERS) IMPLANT
CATH EMB 3FR 40CM (CATHETERS) IMPLANT
CATH EMB 3FR 80CM (CATHETERS) IMPLANT
CATH EMB 4FR 80CM (CATHETERS) IMPLANT
CATH EMB 5FR 80CM (CATHETERS) IMPLANT
CLIP TI WIDE RED SMALL 6 (CLIP) ×1 IMPLANT
COVER DOME SNAP 22 D (MISCELLANEOUS) IMPLANT
COVER PROBE CYLINDRICAL 5X96 (MISCELLANEOUS) ×1 IMPLANT
COVER SURGIBOOT TRANSDUCER 6 (DISPOSABLE) IMPLANT
DERMABOND ADVANCED .7 DNX12 (GAUZE/BANDAGES/DRESSINGS) ×1 IMPLANT
ELECT REM PT RETURN 9FT ADLT (ELECTROSURGICAL) ×1
ELECTRODE REM PT RTRN 9FT ADLT (ELECTROSURGICAL) ×1 IMPLANT
GLOVE BIOGEL PI IND STRL 8 (GLOVE) ×1 IMPLANT
GLOVE SURG SS PI 8.0 STRL IVOR (GLOVE) IMPLANT
GOWN STRL REUS W/ TWL LRG LVL3 (GOWN DISPOSABLE) ×2 IMPLANT
GOWN STRL REUS W/TWL 2XL LVL3 (GOWN DISPOSABLE) ×2 IMPLANT
GOWN STRL REUS W/TWL LRG LVL3 (GOWN DISPOSABLE) ×2
HEMOSTAT SNOW SURGICEL 2X4 (HEMOSTASIS) IMPLANT
KIT BASIN OR (CUSTOM PROCEDURE TRAY) ×1 IMPLANT
KIT MICROPUNCTURE NIT STIFF (SHEATH) IMPLANT
KIT TURNOVER KIT B (KITS) ×1 IMPLANT
LOOP VESSEL MINI RED (MISCELLANEOUS) IMPLANT
MAT PREVALON FULL STRYKER (MISCELLANEOUS) IMPLANT
NS IRRIG 1000ML POUR BTL (IV SOLUTION) ×1 IMPLANT
PACK CV ACCESS (CUSTOM PROCEDURE TRAY) ×1 IMPLANT
PAD ARMBOARD 7.5X6 YLW CONV (MISCELLANEOUS) ×2 IMPLANT
SUT MNCRL AB 4-0 PS2 18 (SUTURE) ×1 IMPLANT
SUT PROLENE 6 0 BV (SUTURE) ×1 IMPLANT
SUT PROLENE 7 0 BV 1 (SUTURE) IMPLANT
SUT PROLENE 7 0 BV1 MDA (SUTURE) IMPLANT
SUT SILK 3 0 (SUTURE) ×1
SUT SILK 3-0 18XBRD TIE 12 (SUTURE) IMPLANT
SUT VIC AB 3-0 SH 27 (SUTURE) ×3
SUT VIC AB 3-0 SH 27X BRD (SUTURE) ×1 IMPLANT
SYR 10ML LL (SYRINGE) IMPLANT
SYR 3ML LL SCALE MARK (SYRINGE) IMPLANT
TOWEL GREEN STERILE FF (TOWEL DISPOSABLE) ×1 IMPLANT
UNDERPAD 30X36 HEAVY ABSORB (UNDERPADS AND DIAPERS) ×1 IMPLANT
WATER STERILE IRR 1000ML POUR (IV SOLUTION) ×1 IMPLANT

## 2022-05-04 NOTE — Anesthesia Preprocedure Evaluation (Addendum)
Anesthesia Evaluation  Patient identified by MRN, date of birth, ID band Patient awake    Reviewed: Allergy & Precautions, NPO status , Patient's Chart, lab work & pertinent test results  Airway Mallampati: III  TM Distance: >3 FB Neck ROM: Full    Dental no notable dental hx.    Pulmonary neg pulmonary ROS   Pulmonary exam normal        Cardiovascular hypertension,  Rhythm:Regular Rate:Normal     Neuro/Psych  Headaches  negative psych ROS   GI/Hepatic Neg liver ROS,GERD  ,,  Endo/Other  diabetes, Type 2Hypothyroidism    Renal/GU ESRF and DialysisRenal disease  negative genitourinary   Musculoskeletal negative musculoskeletal ROS (+)    Abdominal Normal abdominal exam  (+)   Peds  Hematology  (+) Blood dyscrasia, anemia   Anesthesia Other Findings   Reproductive/Obstetrics                             Anesthesia Physical Anesthesia Plan  ASA: 3 and emergent  Anesthesia Plan: General   Post-op Pain Management:    Induction: Intravenous and Rapid sequence  PONV Risk Score and Plan: 2 and Ondansetron, Dexamethasone, Midazolam and Treatment may vary due to age or medical condition  Airway Management Planned: Mask, Oral ETT and Video Laryngoscope Planned  Additional Equipment: None  Intra-op Plan:   Post-operative Plan: Extubation in OR  Informed Consent: I have reviewed the patients History and Physical, chart, labs and discussed the procedure including the risks, benefits and alternatives for the proposed anesthesia with the patient or authorized representative who has indicated his/her understanding and acceptance.     Dental advisory given  Plan Discussed with: CRNA  Anesthesia Plan Comments: (Lab Results      Component                Value               Date                      WBC                      8.4                 04/20/2022                HGB                       13.5                04/20/2022                HCT                      42.1                04/20/2022                MCV                      81.7                04/20/2022                PLT  251                 04/20/2022            Lab Results      Component                Value               Date                      NA                       138                 04/27/2022                K                        4.2                 04/27/2022                CO2                      24                  04/27/2022                GLUCOSE                  107 (H)             04/27/2022                BUN                      43 (H)              04/27/2022                CREATININE               14.16 (H)           04/27/2022                CALCIUM                  8.8 (L)             04/27/2022                GFRNONAA                 4 (L)               04/27/2022           )       Anesthesia Quick Evaluation

## 2022-05-04 NOTE — Transfer of Care (Signed)
Immediate Anesthesia Transfer of Care Note  Patient: Paul [name]  Procedure(s) Performed: LEFT ARM RADIAL BRACHIAL ARTERY EMBOLECTOMY (Left: Arm Upper)  Patient Location: PACU  Anesthesia Type:General  Level of Consciousness: awake and alert   Airway & Oxygen Therapy: Patient Spontanous Breathing and Patient connected to face mask oxygen  Post-op Assessment: Report given to RN and Post -op Vital signs reviewed and stable  Post vital signs: Reviewed and stable  Last Vitals:  Vitals Value Taken Time  BP 124/68 05/04/22 1745  Temp    Pulse 101 05/04/22 1750  Resp 23 05/04/22 1750  SpO2 98 % 05/04/22 1750  Vitals shown include unvalidated device data.  Last Pain:  Vitals:   05/04/22 1503  TempSrc:   PainSc: 7          Complications: No notable events documented.

## 2022-05-04 NOTE — Assessment & Plan Note (Addendum)
Status post embolectomy of left brachial artery.  Warm and perfused on exam radial pulse 2+. -Admit to [facility] Medicine Teaching Service, attending Dr. Owens Shark, [place] -Vascular surgery consulted, recs appreciated -Neurovascular checks every 4 of left upper extremity -Continue home aspirin 81 mg daily -Continue home Eliquis 5 mg twice daily -Postsurgical pain:             -Tylenol 650 every 6 hours scheduled  -Oxycodone 5 mg every 8 hours as needed  -Senokot 1 daily at bedtime -PT/OT eval and treat

## 2022-05-04 NOTE — Progress Notes (Signed)
New Admission Note:   Arrival Method: Stretcher Mental Orientation: ax4 Telemetry: none Assessment: Completed Skin: see flow sheet IV: NSL Pain: see flowsheet Tubes:none Safety Measures: Safety Fall Prevention Plan has been discussed Admission: Completed [facility] Orientation: Patient has been orientated to the room, unit and staff.  Family:  Orders have been reviewed and implemented. Will continue to monitor the patient. Call light has been placed within reach and bed alarm has been activated.   Rockie Neighbours BSN, RN Phone number: [phone]

## 2022-05-04 NOTE — H&P (Cosign Needed Addendum)
Hospital Admission History and Physical Service Pager: [phone]  Patient name: Paul [name] Medical record number: [id number] Date of Birth: [birth year] Age: 31 y.o. Gender: male  Primary Care Provider: Orvis Brill, DO Consultants: Interventional Radiology, Vascular Surgery, Nephrology Code Status: Full  Preferred Emergency Contact:   Paul [name] preferred contact  Contact Information     Name Relation Home Work [place] Sister [phone]  [phone]   Paul [name] Mother [phone]          Chief Complaint: mechanical thrombectomy  Assessment and Plan: Paul [name] is a 31 y.o. male presenting for admission after successful embolectomy for acute limb ischemia in setting of brachial artery thrombus. Thrombus of left brachial artery unable to be removed during mechanical thrombectomy, given absent pulses of the left wrist documented at that time, this is diagnosis of acute limb ischemia.  Patient presents status post emergent left brachial embolectomy with vascular surgery. Pertinent PMHx includes ESRD on HD T/Th/Sa, T2DM, GERD, HLD, HTN, hypothyroidism.   * Occlusion of left brachial artery (HCC) Status post embolectomy of left brachial artery.  Warm and perfused on exam radial pulse 2+. -Admit to [facility] Medicine Teaching Service, attending Dr. Owens Shark, Paul [name] surgery consulted, recs appreciated -Neurovascular checks every 4 of left upper extremity -Continue home aspirin 81 mg daily -Continue home Eliquis 5 mg twice daily -Postsurgical pain:             -Tylenol 650 every 6 hours scheduled  -Oxycodone 5 mg every 8 hours as needed  -Senokot 1 daily at bedtime -PT/OT eval and treat  ESRD on dialysis ([place]) ESRD on TThSa. Left tunneled dialysis catheter due to previous AVG clotting. -Nephrology consulted, recommendations appreciated -CBC, RFP on dialysis days -Strict I's and O's -Monitor volume status -Home Sensipar 30 mg  daily -Home Auryxia 420-840 mg -Home Rena-Vit 1 tablet daily -Renal diet  Type 2 diabetes mellitus (HCC) A1c 2 weeks ago 6.3. Home dose 5 units of Lantus twice daily.  CBGs within appropriate hospital range. -Very sensitive sliding scale on -CBGs with meals and night -Hold home regimen, add as back as necessary -Carb modified diet   Chronic Stable Conditions: Hyperlipidemia -continue home rosuvastatin 20 mg daily Hypothyroidism -Continue home Synthroid 150 mcg  FEN/GI: Renal diet and carb modified VTE Prophylaxis: Home Eliquis  Disposition: Med-Surg  History of Present Illness:  Paul [name] is a 31 y.o. male presenting with acute limb ischemia.  Presented to IR today for final attempt at mechanical thrombectomy of LUE AVG. Seen multiple times by IR for declotting. Declotting today resulted in small amount of thrombus anterior to the left brachial artery, that was unable to be reached via mechanical thrombectomy. Vascular surgery was consulted. Patient had no pulse in left wrist. Patient was started on heparin. Vascular surgery took patient for emergent brachial artery cut down and embolectomy. Embolectomy completed.   Patient states last dialysis session yesterday. Patient has tunneled catheter. Was trying to get graft working at the time. Knew it was clotted right after Thanksgiving. Currently endorses pain from groin area and left arm (femoral access for mechanical thrombectomy). Denies SOB but feels congested. States he is doing well after the surgery but would like some pain control. No chest pain, or swelling.  Review Of Systems: Per HPI.  Pertinent Past Medical History: Hypertension HLD Hypothyroidism ESRD on HD TThSa GERD T2DM  Remainder reviewed in history tab.   Pertinent Past Surgical History: Multiple IR guided thrombectomies for ESRD fistula access. Prior  AVG fistula revisions. 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  Pertinent Social History: Tobacco use: No Alcohol use: no Other Substance use: marijuana  Lives with Mom.  Pertinent Family History: HTN, Diabetes Mellitus  Remainder reviewed in history tab.   Important Outpatient Medications: Rosuvastatin Renovite daily  Sensipar  Eliquis BID Synthroid Phosphate binder  Midodrine with dialysis  Lantus 5 units twice a daily   Hasn't taken medications today.  Remainder reviewed in medication history.   Objective: BP (!) 138/98 (BP Location: Right Arm)   Pulse 88   Temp 98 F (36.7 C)   Resp 19   Ht 6\' 2"  (1.88 m)   Wt (!) 148 kg   SpO2 97%   BMI 41.89 kg/m  Exam: General: A&O, NAD, lying comfortably in hospital bed HEENT: No sign of trauma, EOM grossly intact, moist mucous membranes Cardiac: RRR, no m/r/g Respiratory: CTAB, normal WOB, no w/c/r GI: Soft, NTTP, non-distended, no rebound or guarding Extremities: NTTP, no peripheral edema.  Left upper extremity bandaged and Ace wrapped.  Sensation intact in C6-C8 dermatomes.  Radial pulse 2+ left upper extremity.  Appropriate movement of fingers and hand. Neuro: Moves all four extremities appropriately. Psych: Appropriate mood and affect    Labs:  CBC BMET  Recent Labs  Lab 05/04/22 1450  HGB 14.6  HCT 43.0   Recent Labs  Lab 05/04/22 1450  NA 140  K 4.4  CL 102  BUN 43*  CREATININE 14.80*  GLUCOSE 102*    Pertinent additional labs:  Glu - 154    Imaging Studies Performed:  IR Fluoro Guide CV Line Right IMPRESSION: 1. Failed declot of left brachial basilic graft. 2. Procedure complicated by migration of thrombus into left brachial artery resulting in occlusion. Vascular surgery consulted for thrombectomy.   Salvadore Oxford, MD 05/04/2022, 10:00 PM PGY-1, [facility] Intern pager: [phone], text pages welcome Secure chat group [place] Upper-Level Resident Addendum   I  have independently interviewed and examined the patient. I have discussed the above with the original author and agree with their documentation. My edits for correction/addition/clarification are included. Please see also any attending notes.   Sonia Side, D.O. PGY-3, [place] [facility] 05/04/2022 10:01 PM  [place] Service pager: [phone] (text pages welcome through [facility])

## 2022-05-04 NOTE — H&P (Signed)
Chief Complaint: Patient was seen in consultation today for thrombosed LUE graft at the request of [place] R  Referring Physician(s): Mir,Farhaan R  Supervising Physician: Mir, Sharen Heck  Patient Status: [facility] - Out-pt  History of Present Illness: Paul [name] is a 31 y.o. male well known to IR with ESRD and LUE dialysis graft.  He was in IR most recently on 12/6 for declot and stenting. His AVG continues to fail despite repeated interventions. He returns to IR today for one final attempt at mechanical thrombectomy. He already had tunneled HD cath. PMHx, meds, labs, imaging, allergies reviewed. Feels well, no recent fevers, chills, illness. Has been NPO today as directed. Family at bedside.     Past Medical History:  Diagnosis Date   Acute hypoxemic respiratory failure ([place]) 09/11/2016   Anemia    Chronic kidney disease    ARF on CRF Dialysis T/TH/Sa   Diabetes mellitus    Type II   End stage renal disease on dialysis ([place])    [place]    GERD (gastroesophageal reflux disease)    diet controlled   HLD (hyperlipidemia)    Hypertension    Hypothyroidism    Morbid obesity ([place])    Sacral wound    resolved   Thyroid disease     Past Surgical History:  Procedure Laterality Date   AMPUTATION Left 05/08/2021   Procedure: AMPUTATION GREAT TOE;  Surgeon: Newt Minion, MD;  Location: [place];  Service: Orthopedics;  Laterality: Left;   AV FISTULA PLACEMENT Left 09/26/2016   Procedure: LEFT UPPER ARM ARTERIOVENOUS (AV) FISTULA CREATION;  Surgeon: Elam Dutch, MD;  Location: [place];  Service: Vascular;  Laterality: Left;   AV FISTULA PLACEMENT Left 12/02/2016   Procedure: INSERTION OF ARTERIOVENOUS GORE-TEX GRAFT LEFT UPPER  ARM USING A 4-7MM BY 45CM GRAFT ;  Surgeon: Rosetta Posner, MD;  Location: [place];  Service: Vascular;  Laterality: Left;   AV FISTULA PLACEMENT Right 07/03/2020   Procedure: INSERTION OF ARTERIOVENOUS (AV) GORE-TEX GRAFT RIGHT UPPER ARM;   Surgeon: Marty Heck, MD;  Location: [place];  Service: Vascular;  Laterality: Right;   AV FISTULA PLACEMENT Left 07/02/2021   Procedure: LEFT UPPER EXTREMITY ARTERIOVENOUS FISTULA CREATION;  Surgeon: Waynetta Sandy, MD;  Location: [place];  Service: Vascular;  Laterality: Left;   [place] Right 05/06/2020   Procedure: FIRST STAGE RIGHT [place];  Surgeon: Serafina Mitchell, MD;  Location: [place];  Service: Vascular;  Laterality: Right;   [place] Left 08/20/2021   Procedure: LEFT ARM SECOND STAGE WITH ARTERIOVENOUS GRAFT;  Surgeon: Waynetta Sandy, MD;  Location: [place];  Service: Vascular;  Laterality: Left;   EXCHANGE OF A DIALYSIS CATHETER Right 10/08/2016   Procedure: EXCHANGE OF A DIALYSIS CATHETER-RIGHT INTERNAL JUGULAR;  Surgeon: Conrad Colfax, MD;  Location: [place];  Service: Vascular;  Laterality: Right;   INSERTION OF DIALYSIS CATHETER Right 09/26/2016   Procedure: INSERTION OF DIALYSIS CATHETER - Right Internal Jugular Placement;  Surgeon: Elam Dutch, MD;  Location: [place];  Service: Vascular;  Laterality: Right;   INSERTION OF DIALYSIS CATHETER Left 03/22/2018   Procedure: INSERTION OF DIALYSIS CATHETER;  Surgeon: Marty Heck, MD;  Location: [place];  Service: Vascular;  Laterality: Left;   IR DIALY SHUNT INTRO [place] W/IMG LEFT Left 04/15/2022   IR FLUORO GUIDE CV LINE LEFT  04/19/2022   IR FLUORO GUIDE CV LINE RIGHT  03/26/2021  IR FLUORO GUIDE CV LINE RIGHT  05/13/2021   IR REMOVAL TUN CV CATH W/O FL  05/11/2021   IR REMOVAL TUN CV CATH W/O FL  10/06/2021   IR THROMBECTOMY AV FISTULA W/THROMBOLYSIS/PTA INC/SHUNT/IMG LEFT Left 02/02/2022   IR THROMBECTOMY AV FISTULA W/THROMBOLYSIS/PTA INC/SHUNT/IMG LEFT Left 03/22/2022   IR THROMBECTOMY AV FISTULA W/THROMBOLYSIS/PTA/STENT INC/SHUNT/IMG LT Left 04/27/2022   IR US GUIDE VASC ACCESS LEFT  02/02/2022   IR US GUIDE VASC ACCESS LEFT   03/22/2022   IR US GUIDE VASC ACCESS LEFT  04/15/2022   IR US GUIDE VASC ACCESS LEFT  04/19/2022   IR US GUIDE VASC ACCESS LEFT  04/27/2022   IR US GUIDE VASC ACCESS RIGHT  03/26/2021   IR US GUIDE VASC ACCESS RIGHT  05/13/2021   REVISON OF ARTERIOVENOUS FISTULA Left 03/22/2018   Procedure: LEFT UPPER EXTREMITY ARTERIOVENOUS  REVISION WITH GORE-TEX GRAFT.;  Surgeon: Marty Heck, MD;  Location[facility];  Service: Vascular;  Laterality: Left;   TEE WITHOUT CARDIOVERSION N/A 05/12/2021   Procedure: TRANSESOPHAGEAL ECHOCARDIOGRAM (TEE);  Surgeon: Werner Lean, MD;  [facility]: [facility] ENDOS[facility];  Service: Cardiovascular;  Laterality: N/A;   THROMBECTOMY Left 08/26/2019   thrombectomy of LUA loop AVG   THROMBECTOMY AND REVISION OF ARTERIOVENTOUS (AV) GORETEX  GRAFT Right 07/27/2020   Procedure: INSERTION OF RIGHT ARM LOOP GRAFT WITH EXCISION OF RIGHT ARM BRACHIAL AXILLARY GRAFT;  Surgeon: Marty Heck, MD;  Location: [place];  Service: Vascular;  Laterality: Right;   UPPER EXTREMITY VENOGRAPHY Bilateral 06/14/2021   Procedure: UPPER EXTREMITY VENOGRAPHY;  Surgeon: Waynetta Sandy, MD;  Location: [place] CV LAB;  Service: Cardiovascular;  Laterality: Bilateral;   VISCERAL ANGIOGRAPHY Right 06/17/2020   Procedure: CENTRAL VENO;  Surgeon: Cherre Robins, MD;  Location: [place] CV LAB;  Service: Cardiovascular;  Laterality: Right;    Allergies: Hydralazine hcl  Medications: Prior to Admission medications   Medication Sig Start Date End Date Taking? Authorizing Provider  Accu-Chek Softclix Lancets lancets Use as instructed 06/11/21   Eppie Gibson, MD  acetaminophen (TYLENOL) 500 MG tablet Take 1,000 mg by mouth every 6 (six) hours as needed (for pain.).    [provider]  Blood Glucose Monitoring Suppl (ACCU-CHEK GUIDE ME) w/Device KIT 1 kit by Does not apply route daily. 05/22/21   Eppie Gibson, MD  calcium carbonate (TUMS - DOSED IN MG ELEMENTAL  CALCIUM) 500 MG chewable tablet Chew 1,000 mg by mouth daily as needed for indigestion or heartburn.    [provider]  cetirizine (ZYRTEC) 10 MG tablet Take 10 mg by mouth daily as needed for allergies.    [provider]  cinacalcet (SENSIPAR) 30 MG tablet Take 30 mg by mouth daily. 08/12/21   [provider]  ELIQUIS 5 MG TABS tablet Take 1 tablet by mouth twice daily 04/13/22   Dameron, Luna Fuse, DO  ferric citrate (AURYXIA) 1 GM 210 MG(Fe) tablet Take 420-840 mg by mouth See admin instructions. Take 4 tablets (840 mg) by mouth with each meal & take 2 tablets (420 mg) by mouth with each snack.    [provider]  gabapentin (NEURONTIN) 100 MG capsule TAKE 1 CAPSULE BY MOUTH THREE TIMES DAILY Patient taking differently: Take 100 mg by mouth See admin instructions. Take one capsule (100 mg) in the morning and two capsules (200 mg) in the evening. 04/11/22   Waynetta Sandy, MD  glucose blood (ACCU-CHEK GUIDE) test strip 1 each by Other  route 2 (two) times daily. Use as instructed 09/22/21   Wells Guiles, DO  LANTUS 100 UNIT/ML injection Inject 0.05 mLs (5 Units total) into the skin 2 (two) times daily. 03/11/22   Dameron, Luna Fuse, DO  levothyroxine (SYNTHROID) 150 MCG tablet TAKE 1 TABLET BY MOUTH ONCE DAILY BEFORE BREAKFAST 04/18/22   Dameron, Luna Fuse, DO  midodrine (PROAMATINE) 5 MG tablet Take 1 tablet (5 mg total) by mouth 3 (three) times daily with meals. Patient taking differently: Take 5 mg by mouth Every Tuesday,Thursday,and Saturday with dialysis. 05/14/21   Bonnielee Haff, MD  multivitamin (RENA-VIT) TABS tablet Take 1 tablet by mouth at bedtime. 10/25/16   Nicolette Bang, MD  oxyCODONE (ROXICODONE) 5 MG immediate release tablet Take 1 tablet (5 mg total) by mouth every 8 (eight) hours as needed. 04/20/22 04/20/23  Darci Current, DO  rosuvastatin (CRESTOR) 20 MG tablet Take 1 tablet by mouth once daily 01/08/22   Dameron, Luna Fuse, DO   Syringe, Disposable, (B-D SYRINGE SLIP TIP 30CC) 30 ML MISC 1 Syringe by Does not apply route daily. 01/31/19   Wilber Oliphant, MD     Family History  Problem Relation Age of Onset   Heart disease Mother    Hypertension Mother     Social History   Socioeconomic History   Marital status: Single    Spouse name: Not on file   Number of children: Not on file   Years of education: Not on file   Highest education level: Not on file  Occupational History   Not on file  Tobacco Use   Smoking status: Never   Smokeless tobacco: Never  Vaping Use   Vaping Use: Never used  Substance and Sexual Activity   Alcohol use: No   Drug use: Yes    Frequency: 2.0 times per week    Types: Marijuana    Comment: Last use was on 08/17/21   Sexual activity: Yes  Other Topics Concern   Not on file  Social History Narrative   Not on file   Social Determinants of Health   Financial Resource Strain: Not on file  Food Insecurity: No Food Insecurity (04/18/2022)   Hunger Vital Sign    Worried About Running Out of Food in the Last Year: Never true    Ran Out of Food in the Last Year: Never true  Transportation Needs: No Transportation Needs (04/18/2022)   PRAPARE - Hydrologist (Medical): No    Lack of Transportation (Non-Medical): No  Physical Activity: Insufficiently Active (07/07/2021)   Exercise Vital Sign    Days of Exercise per Week: 4 days    Minutes of Exercise per Session: 30 min  Stress: Stress Concern Present (08/06/2021)   [place]    Feeling of Stress : To some extent  Social Connections: Socially Isolated (06/07/2021)   Social Connection and Isolation Panel [NHANES]    Frequency of Communication with Friends and Family: More than three times a week    Frequency of Social Gatherings with Friends and Family: More than three times a week    Attends Religious Services: Never    Museum/gallery conservator or Organizations: No    Attends Archivist Meetings: Never    Marital Status: Never married    Review of Systems: A 12 point ROS discussed and pertinent positives are indicated in the HPI above.  All other systems are negative.  Review of  Systems  Vital Signs: BP 90/65   Pulse 88   Temp (!) 97.2 F (36.2 C) (Temporal)   Resp 17   Ht _0  (1.88 m)   Wt (!) 325 lb (147.4 kg)   SpO2 97%   BMI 41.73 kg/m     Physical Exam Constitutional:      General: He is not in acute distress.    Appearance: Normal appearance.  Cardiovascular:     Rate and Rhythm: Normal rate and regular rhythm.     Heart sounds: Normal heart sounds.  Pulmonary:     Effort: Pulmonary effort is normal. No respiratory distress.     Breath sounds: Normal breath sounds.  Musculoskeletal:     Comments: (L)UE AVG with no pulse/thrill Hand warm, good radial pulse.  Neurological:     General: No focal deficit present.     Mental Status: He is oriented to person, place, and time.  Psychiatric:        Mood and Affect: Mood normal.        Thought Content: Thought content normal.        Judgment: Judgment normal.     Imaging: IR US Guide Vasc Access Left  Result Date: 04/28/2022 INDICATION: 31 year old gentleman with end-stage renal disease returns to IR for attempted declot of thrombosed left brachial basilic graft. The patient has had numerous prior dialysis fistulous and grafts which have thrombosed and are no longer usable. 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 He is currently being dialyzed through tunneled left external jugular vein HD catheter. EXAM: 1. Antegrade and retrograde ultrasound-guided access of thrombosed left upper extremity graft 2. Left upper extremity graft declot with mechanical and suction thrombectomy 3. Left upper extremity venous anastomosis stent placement MEDICATIONS: Heparin 6000 units intravenous TPA 2 mg intragraft  ANESTHESIA/SEDATION: Moderate (conscious) sedation was employed during this procedure. A total of Versed 2 mg and Fentanyl 125 mcg was administered intravenously by the radiology nurse. Total intra-service moderate Sedation Time: 200 minutes. The patient's level of consciousness and vital signs were monitored continuously by radiology nursing throughout the procedure under my direct supervision. FLUOROSCOPY: Radiation Exposure Index (as provided by the fluoroscopic device): 94 mGy Kerma COMPLICATIONS: None immediate. PROCEDURE: Informed written consent was obtained from the patient after a thorough discussion of the procedural risks, benefits and alternatives. All questions were addressed. Maximal Sterile Barrier Technique was utilized including caps, mask, sterile gowns, sterile gloves, sterile drape, hand hygiene and skin antiseptic. A timeout was performed prior to the initiation of the procedure. Ultrasound image documenting thrombosis of the left upper extremity graft was obtained and placed in permanent medical record. Sterile ultrasound probe cover and gel utilized throughout the procedure. Utilizing continuous ultrasound guidance, the left upper extremity graft was accessed in the antegrade direction with a 21 gauge needle. 21 gauge needle exchanged for a transitional dilator set over 0.018 inch guidewire. Transitional dilator set exchanged for 6 French sheath over 0.035 inch guidewire. The thrombosed segment of the venous anastomosis was successfully crossed utilizing Kumpe catheter and glidewire. Venogram performed distal to the thrombosed segment confirmed patency of the left axillary vein. The thrombosed segment was plasty with 7 mm balloon. Ultrasound image documenting thrombosis of the left upper extremity graft was obtained and placed in permanent medical record. Sterile ultrasound probe cover and gel utilized throughout the procedure. Utilizing continuous ultrasound guidance, the left upper extremity  graft was accessed in the retrograde direction with a 21 gauge needle. 21  gauge needle exchanged for a transitional dilator set over 0.018 inch guidewire. Transitional dilator set exchanged for 6 French sheath over 0.035 inch guidewire. Kumpe catheter was advanced across the anastomosis to the brachial artery. The length of the graft was plasty with a 7 mm balloon through the antegrade sheath. Fogarty balloon was inserted through the retrograde access in the arterial plug was pulled with return of minimal flow in the fistula circuit. The Fogarty balloon was then inserted through the antegrade access in the clot was pushed centrally. Flow remains sluggish throughout the fistula circuit. Fistulagram demonstrated continued high-grade stenosis at the venous anastomosis despite balloon plasty. The antegrade sheath was upsized to 8 [place] and 8 mm x 53 mm Covera covered stent was deployed at the venous anastomosis stenosis. The stent was plastied with 8 mm balloon. Flow remained sluggish throughout the circuit. Additional Fogarty balloon sweeps were performed through retrograde and antegrade sheath, however flow still remains sluggish. The anastomosis was plasty with a 5 mm balloon an additional balloon sweeps were performed which did not significantly improved flow. The retrograde sheath was upsized to 7 [place] and suction thrombectomy was performed with CT 7 penumbra device through both antegrade and retrograde access series further reducing clot burden, however flow remained sluggish. The entire length of the graft was plasty with a 7 mm balloon as a final attempt, however the flow was still suboptimal. Both sheaths were removed and hemostasis achieved with pursestring sutures. IMPRESSION: 1. Attempted declot of left brachial basilic graft. Sluggish flow was present within the graft at the end of the procedure, however given right of flow and residual thrombus, it is likely to thrombose. 2. 8 mm stent was placed at the  venous anastomosis. 3. Arterial anastomosis plastied up to 5 mm. 4. Entire length of the graft plasties with 7 mm balloon. 5. Mechanical and suction thrombectomy were performed. PLAN: 1. Use tunneled dialysis catheter for HD. 2. Patient should return within 2-3 weeks to again attempt declot. ACCESS: This access remains amenable to future percutaneous interventions as clinically indicated. Electronically Signed   By: Miachel Roux M.D.   On: 04/28/2022 08:52   IR THROMBECTOMY AV FISTULA W/THROMBOLYSIS/PTA/STENT INC SHUNT IMG LT  Result Date: 04/28/2022 INDICATION: 31 year old gentleman with end-stage renal disease returns to IR for attempted declot of thrombosed left brachial basilic graft. The patient has had numerous prior dialysis fistulous and grafts which have thrombosed and are no longer usable. 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 He is currently being dialyzed through tunneled left external jugular vein HD catheter. EXAM: 1. Antegrade and retrograde ultrasound-guided access of thrombosed left upper extremity graft 2. Left upper extremity graft declot with mechanical and suction thrombectomy 3. Left upper extremity venous anastomosis stent placement MEDICATIONS: Heparin 6000 units intravenous TPA 2 mg intragraft ANESTHESIA/SEDATION: Moderate (conscious) sedation was employed during this procedure. A total of Versed 2 mg and Fentanyl 125 mcg was administered intravenously by the radiology nurse. Total intra-service moderate Sedation Time: 200 minutes. The patient's level of consciousness and vital signs were monitored continuously by radiology nursing throughout the procedure under my direct supervision. FLUOROSCOPY: Radiation Exposure Index (as provided by the fluoroscopic device): 94 mGy Kerma COMPLICATIONS: None immediate. PROCEDURE: Informed written consent was obtained from the patient after a thorough discussion of the procedural risks, benefits and alternatives.  All questions were addressed. Maximal Sterile Barrier Technique was utilized including caps, mask, sterile gowns, sterile gloves, sterile drape, hand hygiene and skin antiseptic. A  timeout was performed prior to the initiation of the procedure. Ultrasound image documenting thrombosis of the left upper extremity graft was obtained and placed in permanent medical record. Sterile ultrasound probe cover and gel utilized throughout the procedure. Utilizing continuous ultrasound guidance, the left upper extremity graft was accessed in the antegrade direction with a 21 gauge needle. 21 gauge needle exchanged for a transitional dilator set over 0.018 inch guidewire. Transitional dilator set exchanged for 6 French sheath over 0.035 inch guidewire. The thrombosed segment of the venous anastomosis was successfully crossed utilizing Kumpe catheter and glidewire. Venogram performed distal to the thrombosed segment confirmed patency of the left axillary vein. The thrombosed segment was plasty with 7 mm balloon. Ultrasound image documenting thrombosis of the left upper extremity graft was obtained and placed in permanent medical record. Sterile ultrasound probe cover and gel utilized throughout the procedure. Utilizing continuous ultrasound guidance, the left upper extremity graft was accessed in the retrograde direction with a 21 gauge needle. 21 gauge needle exchanged for a transitional dilator set over 0.018 inch guidewire. Transitional dilator set exchanged for 6 French sheath over 0.035 inch guidewire. Kumpe catheter was advanced across the anastomosis to the brachial artery. The length of the graft was plasty with a 7 mm balloon through the antegrade sheath. Fogarty balloon was inserted through the retrograde access in the arterial plug was pulled with return of minimal flow in the fistula circuit. The Fogarty balloon was then inserted through the antegrade access in the clot was pushed centrally. Flow remains sluggish  throughout the fistula circuit. Fistulagram demonstrated continued high-grade stenosis at the venous anastomosis despite balloon plasty. The antegrade sheath was upsized to 8 [place] and 8 mm x 53 mm Covera covered stent was deployed at the venous anastomosis stenosis. The stent was plastied with 8 mm balloon. Flow remained sluggish throughout the circuit. Additional Fogarty balloon sweeps were performed through retrograde and antegrade sheath, however flow still remains sluggish. The anastomosis was plasty with a 5 mm balloon an additional balloon sweeps were performed which did not significantly improved flow. The retrograde sheath was upsized to 7 [place] and suction thrombectomy was performed with CT 7 penumbra device through both antegrade and retrograde access series further reducing clot burden, however flow remained sluggish. The entire length of the graft was plasty with a 7 mm balloon as a final attempt, however the flow was still suboptimal. Both sheaths were removed and hemostasis achieved with pursestring sutures. IMPRESSION: 1. Attempted declot of left brachial basilic graft. Sluggish flow was present within the graft at the end of the procedure, however given right of flow and residual thrombus, it is likely to thrombose. 2. 8 mm stent was placed at the venous anastomosis. 3. Arterial anastomosis plastied up to 5 mm. 4. Entire length of the graft plasties with 7 mm balloon. 5. Mechanical and suction thrombectomy were performed. PLAN: 1. Use tunneled dialysis catheter for HD. 2. Patient should return within 2-3 weeks to again attempt declot. ACCESS: This access remains amenable to future percutaneous interventions as clinically indicated. Electronically Signed   By: Miachel Roux M.D.   On: 04/28/2022 08:52   IR Fluoro Guide CV Line Left  Result Date: 04/20/2022 INDICATION: 31 year old gentleman with chronic renal failure on hemodialysis has a thrombosed left upper extremity dialysis graft. He  presents to IR today for tunneled HD catheter placement. EXAM: Tunneled left external jugular vein dialysis catheter placement MEDICATIONS: Ancef 2 g IV; The antibiotic was administered within an appropriate time interval  prior to skin puncture. ANESTHESIA/SEDATION: Moderate (conscious) sedation was employed during this procedure. A total of Versed 1 mg and Fentanyl 25 mcg was administered intravenously by the radiology nurse. Total intra-service moderate Sedation Time: 32 minutes. The patient's level of consciousness and vital signs were monitored continuously by radiology nursing throughout the procedure under my direct supervision. FLUOROSCOPY: Radiation Exposure Index (as provided by the fluoroscopic device): 643 mGy Kerma COMPLICATIONS: None immediate. PROCEDURE: Informed written consent was obtained from the patient after a thorough discussion of the procedural risks, benefits and alternatives. All questions were addressed. Maximal Sterile Barrier Technique was utilized including caps, mask, sterile gowns, sterile gloves, sterile drape, hand hygiene and skin antiseptic. A timeout was performed prior to the initiation of the procedure. Patient positioned supine on the procedure table. Evaluation of the right neck showed the internal jugular vein to be thrombosed. Sterile ultrasound probe cover and gel were utilized throughout the procedure. An image documenting patency of the right external jugular vein was obtained and placed in permanent medical record. Utilizing continuous ultrasound guidance, the right internal jugular vein was accessed with a 21 gauge needle. Despite multiple attempts, the guidewire could not be advanced centrally due to tortuosity of the vein. The left neck skin was then prepped and draped usual fashion. Ultrasound evaluation of the left neck showed the internal jugular vein to be thrombosed. The left external jugular vein was patent. Utilizing continuous ultrasound guidance, the external  jugular vein was accessed with a 21 gauge needle. 21 gauge needle exchanged for transitional dilator set over 0.018 inch guidewire. Transitional dilator set removed over 0.035 inch guidewire. The guidewire was advanced to the level of the IVC utilizing fluoroscopic guidance. Serial dilation was performed and peel-away sheath was placed. Following local lidocaine administration, 28 cm hemodialysis catheter was brought through a short subcutaneous tunnel in the left anterior chest wall to the venotomy site and inserted through the peel-away sheath. The tip was positioned within the right atrium. Both lumens aspirated and flushed well and were locked with heparin. Catheter secured to skin with silk suture. IMPRESSION: 1. Tunneled left external jugular vein hemodialysis catheter is ready for use (28 cm). 2. 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 Right external jugular vein is patent, however does not communicate with central venous structures in a pattern which would allow for catheter placement. Electronically Signed   By: Miachel Roux M.D.   On: 04/20/2022 08:05   IR US Guide Vasc Access Left  Result Date: 04/20/2022 INDICATION: 31 year old gentleman with chronic renal failure on hemodialysis has a thrombosed left upper extremity dialysis graft. He presents to IR today for tunneled HD catheter placement. EXAM: Tunneled left external jugular vein dialysis catheter placement MEDICATIONS: Ancef 2 g IV; The antibiotic was administered within an appropriate time interval prior to skin puncture. ANESTHESIA/SEDATION: Moderate (conscious) sedation was employed during this procedure. A total of Versed 1 mg and Fentanyl 25 mcg was administered intravenously by the radiology nurse. Total intra-service moderate Sedation Time: 32 minutes. The patient's level of consciousness and vital signs were monitored continuously by radiology nursing throughout the procedure under my direct supervision. FLUOROSCOPY: Radiation  Exposure Index (as provided by the fluoroscopic device): 329 mGy Kerma COMPLICATIONS: None immediate. PROCEDURE: Informed written consent was obtained from the patient after a thorough discussion of the procedural risks, benefits and alternatives. All questions were addressed. Maximal Sterile Barrier Technique was utilized including caps, mask, sterile gowns, sterile gloves, sterile drape, hand hygiene and skin antiseptic. A timeout was  performed prior to the initiation of the procedure. Patient positioned supine on the procedure table. Evaluation of the right neck showed the internal jugular vein to be thrombosed. Sterile ultrasound probe cover and gel were utilized throughout the procedure. An image documenting patency of the right external jugular vein was obtained and placed in permanent medical record. Utilizing continuous ultrasound guidance, the right internal jugular vein was accessed with a 21 gauge needle. Despite multiple attempts, the guidewire could not be advanced centrally due to tortuosity of the vein. The left neck skin was then prepped and draped usual fashion. Ultrasound evaluation of the left neck showed the internal jugular vein to be thrombosed. The left external jugular vein was patent. Utilizing continuous ultrasound guidance, the external jugular vein was accessed with a 21 gauge needle. 21 gauge needle exchanged for transitional dilator set over 0.018 inch guidewire. Transitional dilator set removed over 0.035 inch guidewire. The guidewire was advanced to the level of the IVC utilizing fluoroscopic guidance. Serial dilation was performed and peel-away sheath was placed. Following local lidocaine administration, 28 cm hemodialysis catheter was brought through a short subcutaneous tunnel in the left anterior chest wall to the venotomy site and inserted through the peel-away sheath. The tip was positioned within the right atrium. Both lumens aspirated and flushed well and were locked with  heparin. Catheter secured to skin with silk suture. IMPRESSION: 1. Tunneled left external jugular vein hemodialysis catheter is ready for use (28 cm). 2. 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 Right external jugular vein is patent, however does not communicate with central venous structures in a pattern which would allow for catheter placement. Electronically Signed   By: Miachel Roux M.D.   On: 04/20/2022 08:05   DG Chest 2 View  Result Date: 04/18/2022 CLINICAL DATA:  Shortness of breath for 1-2 days. History of diabetes and end-stage renal disease. EXAM: CHEST - 2 VIEW COMPARISON:  Radiographs 05/06/2021 and 03/22/2018. FINDINGS: Hemodialysis catheter has been removed. A vascular stent is noted within the right axilla. The heart size and mediastinal contours are normal. The lungs are clear. There is no pleural effusion or pneumothorax. No acute osseous findings are identified. IMPRESSION: No active cardiopulmonary process. Electronically Signed   By: Richardean Sale M.D.   On: 04/18/2022 10:38   IR DIALY SHUNT INTRO NEEDLE/INTRACATH INITIAL W/IMG LEFT  Result Date: 04/15/2022 INDICATION: 31 year old with end-stage renal disease. History of brachial artery to basilic vein fistula with placement of interposition graft to the axillary vein. Patient underwent AV fistula declot procedures on 02/02/2022 and 03/22/2022. Patient now presents with abnormal flows. EXAM: 1. Left upper extremity fistulogram 2. Ultrasound guidance for vascular access 3. Unsuccessful recanalization of the occluded outflow vein MEDICATIONS: Moderate sedation ANESTHESIA/SEDATION: Moderate (conscious) sedation was employed during this procedure. A total of Versed 0.43m and fentanyl 25 mcg was administered intravenously at the order of the provider performing the procedure. Total intra-service moderate sedation time: 45 minutes. Patient's level of consciousness and vital signs were monitored continuously by radiology nurse  throughout the procedure under the supervision of the provider performing the procedure. FLUOROSCOPY: Radiation Exposure Index (as provided by the fluoroscopic device): 124 mGy Kerma CONTRAST:  50 mL Omnipaque [id number]: None immediate. PROCEDURE: The procedure was explained to the patient. The risks and benefits of the procedure were discussed and the patient's questions were addressed. Informed consent was obtained from the patient. The left upper arm was prepped and draped in sterile fashion. Ultrasound confirmed a patent left AV fistula.  Skin was anesthetized with 1% lidocaine. Using ultrasound guidance, an Angiocath was directed into the AV fistula and fistulogram images were obtained. Bentson wire was advanced and the Angiocath was exchanged for a 6 [place] vascular sheath. Five French catheter was advanced into the outflow vein and additional fistulogram images were obtained. Multiple attempts were made to cannulate the occluded outflow vein segment. These attempts to cannulate this vein were unsuccessful. Final fistulogram images were obtained. The vascular sheath was removed with a pursestring suture. Bandage was placed. FINDINGS: The AV fistula/graft was patent. However, there was a short segment occlusion of the outflow vein near the venous anastomosis and the AV fistula is draining through venous collateral flow. Could not advance a catheter or wire into the occluded segment and could not recanalize this occluded segment. Central veins are patent. Arterial anastomosis is patent but there may be at least mild narrowing at the anastomosis. IMPRESSION: 1. Left upper extremity fistula/graft is patent but there is a short segment occlusion of the outflow vein in the upper arm. The AV fistula is draining through collateral venous flow. Unsuccessful recanalization of this occluded outflow vein. Recommend surgical consultation. 2. Probable stenosis near the arterial anastomosis. 3. Recommended placement  of a tunneled dialysis catheter but the patient declined catheter placement at this time. Electronically Signed   By: Markus Daft M.D.   On: 04/15/2022 18:43   IR US Guide Vasc Access Left  Result Date: 04/15/2022 INDICATION: 31 year old with end-stage renal disease. History of brachial artery to basilic vein fistula with placement of interposition graft to the axillary vein. Patient underwent AV fistula declot procedures on 02/02/2022 and 03/22/2022. Patient now presents with abnormal flows. EXAM: 1. Left upper extremity fistulogram 2. Ultrasound guidance for vascular access 3. Unsuccessful recanalization of the occluded outflow vein MEDICATIONS: Moderate sedation ANESTHESIA/SEDATION: Moderate (conscious) sedation was employed during this procedure. A total of Versed 0.31m and fentanyl 25 mcg was administered intravenously at the order of the provider performing the procedure. Total intra-service moderate sedation time: 45 minutes. Patient's level of consciousness and vital signs were monitored continuously by radiology nurse throughout the procedure under the supervision of the provider performing the procedure. FLUOROSCOPY: Radiation Exposure Index (as provided by the fluoroscopic device): 124 mGy Kerma CONTRAST:  50 mL Omnipaque [id number]: None immediate. PROCEDURE: The procedure was explained to the patient. The risks and benefits of the procedure were discussed and the patient's questions were addressed. Informed consent was obtained from the patient. The left upper arm was prepped and draped in sterile fashion. Ultrasound confirmed a patent left AV fistula. Skin was anesthetized with 1% lidocaine. Using ultrasound guidance, an Angiocath was directed into the AV fistula and fistulogram images were obtained. Bentson wire was advanced and the Angiocath was exchanged for a 6 [place] sheath. Five French catheter was advanced into the outflow vein and additional fistulogram images were obtained.  Multiple attempts were made to cannulate the occluded outflow vein segment. These attempts to cannulate this vein were unsuccessful. Final fistulogram images were obtained. The vascular sheath was removed with a pursestring suture. Bandage was placed. FINDINGS: The AV fistula/graft was patent. However, there was a short segment occlusion of the outflow vein near the venous anastomosis and the AV fistula is draining through venous collateral flow. Could not advance a catheter or wire into the occluded segment and could not recanalize this occluded segment. Central veins are patent. Arterial anastomosis is patent but there may be at least mild narrowing at the anastomosis.  IMPRESSION: 1. Left upper extremity fistula/graft is patent but there is a short segment occlusion of the outflow vein in the upper arm. The AV fistula is draining through collateral venous flow. Unsuccessful recanalization of this occluded outflow vein. Recommend surgical consultation. 2. Probable stenosis near the arterial anastomosis. 3. Recommended placement of a tunneled dialysis catheter but the patient declined catheter placement at this time. Electronically Signed   By: Markus Daft M.D.   On: 04/15/2022 18:43    Labs:  CBC: Recent Labs    01/31/22 1230 04/18/22 1014 04/19/22 0901 04/20/22 0608  WBC 11.6* 8.9 7.8 8.4  HGB 13.6 13.8 13.2 13.5  HCT 42.0 42.4 42.0 42.1  PLT 302 269 247 251    COAGS: Recent Labs    05/06/21 1600  INR 1.3*  APTT 34    BMP: Recent Labs    04/18/22 1014 04/19/22 0901 04/20/22 0608 04/27/22 1251  NA 139 140 136 138  K 5.0 6.5* 4.4 4.2  CL 102 100 94* 98  CO2 17* 16* 23 24  GLUCOSE 96 102* 128* 107*  BUN 81* 91* 52* 43*  CALCIUM 8.3* 8.3* 8.4* 8.8*  CREATININE 25.22* 27.99* 17.74* 14.16*  GFRNONAA 2* 2* 3* 4*    LIVER FUNCTION TESTS: Recent Labs    05/06/21 1600 05/11/21 0824 05/14/21 0611 04/18/22 1014 04/19/22 0901 04/20/22 0608  BILITOT 0.7  --   --  0.7 0.7  --    AST 21  --   --  12* 15  --   ALT 12  --   --  13 13  --   ALKPHOS 69  --   --  51 51  --   PROT 9.8*  --   --  7.6 7.4  --   ALBUMIN 4.3   < > 3.3* 4.1 3.9 4.2   < > = values in this interval not displayed.    TUMOR MARKERS: No results for input(s): "AFPTM", "CEA", "CA199", "CHROMGRNA" in the last 8760 hours.  Assessment and Plan: Recurrent (L)UE AVG thrombosis. Will plan for another attempt at thrombectomy. Will try to come from femoral approach in hoped for improved outcome. Procedure discussed in detail with pt. Risks and benefits discussed with the patient including, but not limited to bleeding, infection, vascular injury, pulmonary embolism.  All of the patient's questions were answered, patient is agreeable to proceed. Consent signed and in chart.   Thank you for this interesting consult.  I greatly enjoyed meeting Rashawn Rayman and look forward to participating in their care.  A copy of this report was sent to the requesting provider on this date.  Electronically Signed: Ascencion Dike, PA-C 05/04/2022, 10:59 AM   I spent a total of 20 minutes in face to face in clinical consultation, greater than 50% of which was counseling/coordinating care for AVG thrombectomy

## 2022-05-04 NOTE — Assessment & Plan Note (Addendum)
A1c 6.3. -vsSSI -CBGs with meals and night -Hold home Lantus 5U, add as back as necessary

## 2022-05-04 NOTE — Assessment & Plan Note (Addendum)
ESRD on TThSa. Left tunneled dialysis catheter due to previous AVG clotting. -Nephrology consulted, recommendations appreciated -CBC, RFP on dialysis days -Strict I's and O's -Monitor volume status -Home Sensipar 30 mg daily -Home Auryxia 420-840 mg -Home Rena-Vit 1 tablet daily -Renal diet

## 2022-05-04 NOTE — Hospital Course (Signed)
Paul [name] is a 31 y.o.male with a history of ESRD on HD T/Th/Sa, T2DM, GERD, HLD, HTN, hypothyroidism who was admitted to the [facility] Medicine Teaching Service at [facility] for embolectomy of L brachial artery after failed mechanical thrombectomy . His hospital course is detailed below:  Occlusion of left brachial artery  Patient presented to IR on 12/13 for mechanical thrombectomy of LUE AVG after multiple attempts at declotting. The procedure resulted in small amount of thrombus anterior to the left brachial artery, that was unable to be reached via mechanical thrombectomy. Patient had no pulse in left wrist and vascular surgery was emergently consulted. Patient received emergent L brachial artery cut down and embolectomy.   ESRD Nephrology consulted and patient received HD on 12/14 via left tunneled dialysis catheter due to AVG clotting.  Other chronic conditions were medically managed with home medications and formulary alternatives as necessary (HLD, hypothyroidism)  PCP Follow-up Recommendations: Vascular f/u for long term dialysis access

## 2022-05-04 NOTE — Anesthesia Procedure Notes (Signed)
Procedure Name: Intubation Date/Time: 05/04/2022 3:30 PM  Performed by: Darral Dash, DOPre-anesthesia Checklist: Patient identified, Emergency Drugs available, Suction available and Patient being monitored Patient Re-evaluated:Patient Re-evaluated prior to induction Oxygen Delivery Method: Circle System Utilized Preoxygenation: Pre-oxygenation with 100% oxygen Induction Type: IV induction Ventilation: Mask ventilation without difficulty Laryngoscope Size: Glidescope and 4 Grade View: Grade I Tube type: Oral Tube size: 8.0 mm Number of attempts: 1 Airway Equipment and Method: Oral airway, Video-laryngoscopy and Rigid stylet Placement Confirmation: ETT inserted through vocal cords under direct vision, positive ETCO2 and breath sounds checked- equal and bilateral Secured at: 23 cm Tube secured with: Tape Dental Injury: Teeth and Oropharynx as per pre-operative assessment

## 2022-05-04 NOTE — Anesthesia Postprocedure Evaluation (Signed)
Anesthesia Post Note  Patient: Paul [name]  Procedure(s) Performed: LEFT ARM RADIAL BRACHIAL ARTERY EMBOLECTOMY (Left: Arm Upper) INTRA OPERATIVE ARTERIOGRAM (Left: Arm Upper)     Patient location during evaluation: PACU Anesthesia Type: General Level of consciousness: awake Pain management: pain level controlled Vital Signs Assessment: post-procedure vital signs reviewed and stable Respiratory status: spontaneous breathing Cardiovascular status: stable Postop Assessment: no apparent nausea or vomiting Anesthetic complications: no   No notable events documented.  Last Vitals:  Vitals:   05/04/22 1745 05/04/22 1800  BP: 124/68 134/69  Pulse: 98 94  Resp: (!) 22 (!) 21  Temp: 36.7 C   SpO2: 94% 99%    Last Pain:  Vitals:   05/04/22 1745  TempSrc:   PainSc: 0-No pain                 Emika Tiano

## 2022-05-04 NOTE — Op Note (Signed)
    NAME: Paul [name]    MRN: [id number] DOB: 1991-01-18    DATE OF OPERATION: 05/04/2022  PREOP DIAGNOSIS:    Left arm acute limb ischemia  POSTOP DIAGNOSIS:    Same  PROCEDURE:    Left arm brachial artery embolectomy, ulnar artery embolectomy, radial artery embolectomy  SURGEON: Broadus John  ASSIST: Roxy Horseman, PA  ANESTHESIA: General  EBL: 50 mL  INDICATIONS:    Chaddrick Brue is a 31 y.o. male with longstanding history of end-stage renal disease who was undergoing percutaneous thrombectomy of recently thrombosed left arm AV graft.  Unfortunately, thrombus entered the left brachial artery and was unable to be retrieved.  After discussing the risk and benefits of left arm brachial artery embolectomy, Akiem elected to proceed.  FINDINGS:   Small amount of left brachial artery thrombus  TECHNIQUE:   Patient brought to the OR laid in supine position.  General anesthesia was induced and the patient was prepped draped in standard fashion.  A timeout was performed.  An ACT was checked and heparin was administered to an ACT greater than 250 for the remainder of the case.  The case began with ultrasound insonation of the left brachial artery.  A longitudinal incision was made below the antecubital fossa at the site of the brachial artery bifurcation.  This was carried down to the left brachial artery.  This took a considerable amount of time due to the patient's large size.  The brachial vein was a web, covering the artery.  Multiple branches were ligated with 3-0 suture and the portion of the vein covering the artery was resected.  The brachial artery, ulnar artery, radial artery were controlled using Vesseloops.  The brachial artery was small, measuring 3 mm.  The ulnar and radial arteries were 2 mm in size. A transverse arteriotomy was made and a #2 Fogarty embolectomy catheter was used proximally in the brachial artery and distally in both the radial and ulnar  arteries.  There was a small amount of thrombus appreciated from the brachial artery.  The alteplase that was administered during the IR procedure was effective in reducing the thrombus burden tremendously. Once passes were appreciated in the brachial, radial, ulnar arteries, the transverse arteriotomy was closed with interrupted 7-0 Prolene suture.  I elected to perform a diagnostic angiogram of the left upper extremity to ensure there was no residual thrombotic burden.  The artery was accessed using a micropuncture needle, with diagnostic angiogram of the left brachial artery and radial and ulnar arteries extending into the hand.  There was no thrombotic burden appreciated.  The arteriotomy was closed with a 7-0 Prolene suture.  Heparin was reversed with the use of protamine.  The wound bed was irrigated with copious amounts of saline and closed in layers using 2-0 Vicryl suture.  Monocryl and Dermabond was used at the level of the skin.  A compression dressing was placed as there was significant dead space due to the patient's large size.  Impression: Successful brachial artery embolectomy.  Triphasic radial and ulnar signals at case completion with multiphasic palmar arch signal.  Macie Burows, MD [facility] DATE OF DICTATION:   05/04/2022

## 2022-05-04 NOTE — Anesthesia Procedure Notes (Signed)
Arterial Line Insertion Start/End12/13/2023 3:34 PM, 05/04/2022 3:36 PM Performed by: Darral Dash, DO, anesthesiologist  Patient location: OR. Preanesthetic checklist: patient identified, IV checked, site marked, risks and benefits discussed, surgical consent, monitors and equipment checked, pre-op evaluation, timeout performed and anesthesia consent Patient sedated Right, radial was placed Catheter size: 20 G Hand hygiene performed  and maximum sterile barriers used   Attempts: 1 Procedure performed using ultrasound guided technique. Ultrasound Notes:anatomy identified, needle tip was noted to be adjacent to the nerve/plexus identified, no ultrasound evidence of intravascular and/or intraneural injection and image(s) printed for medical record Following insertion, dressing applied and Biopatch. Post procedure assessment: normal and unchanged  Patient tolerated the procedure well with no immediate complications.

## 2022-05-04 NOTE — Consult Note (Signed)
Hospital Consult    Reason for Consult: Left arm ischemia Requesting Physician: Dr. Dwaine Gale MRN #:  [id number]  History of Present Illness: This is a 31 y.o. male with end-stage renal disease who has had multiple issues with AV graft thrombosis.  He was undergoing declot today with a small amount of thrombus anterior to the left brachial artery.  This was unable to be retrieved with a thrombectomy device.  Vascular surgery was called for further recommendations.  On exam, Tegh denied sensorimotor deficits in the left hand.  He was heparinized.  No pulse at the level of the wrist    Past Medical History:  Diagnosis Date   Acute hypoxemic respiratory failure ([place]) 09/11/2016   Anemia    Chronic kidney disease    ARF on CRF Dialysis T/TH/Sa   Diabetes mellitus    Type II   End stage renal disease on dialysis ([place])    [place]    GERD (gastroesophageal reflux disease)    diet controlled   HLD (hyperlipidemia)    Hypertension    Hypothyroidism    Morbid obesity ([place])    Sacral wound    resolved   Thyroid disease     Past Surgical History:  Procedure Laterality Date   AMPUTATION Left 05/08/2021   Procedure: AMPUTATION GREAT TOE;  Surgeon: Newt Minion, MD;  Location: [place];  Service: Orthopedics;  Laterality: Left;   AV FISTULA PLACEMENT Left 09/26/2016   Procedure: LEFT UPPER ARM ARTERIOVENOUS (AV) FISTULA CREATION;  Surgeon: Elam Dutch, MD;  Location: [place];  Service: Vascular;  Laterality: Left;   AV FISTULA PLACEMENT Left 12/02/2016   Procedure: INSERTION OF ARTERIOVENOUS GORE-TEX GRAFT LEFT UPPER  ARM USING A 4-7MM BY 45CM GRAFT ;  Surgeon: Rosetta Posner, MD;  Location: [place];  Service: Vascular;  Laterality: Left;   AV FISTULA PLACEMENT Right 07/03/2020   Procedure: INSERTION OF ARTERIOVENOUS (AV) GORE-TEX GRAFT RIGHT UPPER ARM;  Surgeon: Marty Heck, MD;  Location: [place];  Service: Vascular;  Laterality: Right;   AV FISTULA PLACEMENT Left 07/02/2021    Procedure: LEFT UPPER EXTREMITY ARTERIOVENOUS FISTULA CREATION;  Surgeon: Waynetta Sandy, MD;  Location: [place];  Service: Vascular;  Laterality: Left;   [place] Right 05/06/2020   Procedure: FIRST STAGE RIGHT [place];  Surgeon: Serafina Mitchell, MD;  Location: [place];  Service: Vascular;  Laterality: Right;   [place] Left 08/20/2021   Procedure: LEFT ARM SECOND STAGE WITH ARTERIOVENOUS GRAFT;  Surgeon: Waynetta Sandy, MD;  Location: [place];  Service: Vascular;  Laterality: Left;   EXCHANGE OF A DIALYSIS CATHETER Right 10/08/2016   Procedure: EXCHANGE OF A DIALYSIS CATHETER-RIGHT INTERNAL JUGULAR;  Surgeon: Conrad Ben Avon, MD;  Location: [place];  Service: Vascular;  Laterality: Right;   INSERTION OF DIALYSIS CATHETER Right 09/26/2016   Procedure: INSERTION OF DIALYSIS CATHETER - Right Internal Jugular Placement;  Surgeon: Elam Dutch, MD;  Location: [place];  Service: Vascular;  Laterality: Right;   INSERTION OF DIALYSIS CATHETER Left 03/22/2018   Procedure: INSERTION OF DIALYSIS CATHETER;  Surgeon: Marty Heck, MD;  Location: [place];  Service: Vascular;  Laterality: Left;   IR DIALY SHUNT INTRO [place] W/IMG LEFT Left 04/15/2022   IR FLUORO GUIDE CV LINE LEFT  04/19/2022   IR FLUORO GUIDE CV LINE RIGHT  03/26/2021   IR FLUORO GUIDE CV LINE RIGHT  05/13/2021   IR REMOVAL TUN CV  CATH W/O FL  05/11/2021   IR REMOVAL TUN CV CATH W/O FL  10/06/2021   IR THROMBECTOMY AV FISTULA W/THROMBOLYSIS/PTA INC/SHUNT/IMG LEFT Left 02/02/2022   IR THROMBECTOMY AV FISTULA W/THROMBOLYSIS/PTA INC/SHUNT/IMG LEFT Left 03/22/2022   IR THROMBECTOMY AV FISTULA W/THROMBOLYSIS/PTA/STENT INC/SHUNT/IMG LT Left 04/27/2022   IR US GUIDE VASC ACCESS LEFT  02/02/2022   IR US GUIDE VASC ACCESS LEFT  03/22/2022   IR US GUIDE VASC ACCESS LEFT  04/15/2022   IR US GUIDE VASC ACCESS LEFT  04/19/2022   IR US GUIDE VASC ACCESS LEFT   04/27/2022   IR US GUIDE VASC ACCESS RIGHT  03/26/2021   IR US GUIDE VASC ACCESS RIGHT  05/13/2021   REVISON OF ARTERIOVENOUS FISTULA Left 03/22/2018   Procedure: LEFT UPPER EXTREMITY ARTERIOVENOUS  REVISION WITH GORE-TEX GRAFT.;  Surgeon: Marty Heck, MD;  Location[facility] OR;  Service: Vascular;  Laterality: Left;   TEE WITHOUT CARDIOVERSION N/A 05/12/2021   Procedure: TRANSESOPHAGEAL ECHOCARDIOGRAM (TEE);  Surgeon: Werner Lean, MD;  Location: [facility] ENDOSCOPY;  Service: Cardiovascular;  Laterality: N/A;   THROMBECTOMY Left 08/26/2019   thrombectomy of LUA loop AVG   THROMBECTOMY AND REVISION OF ARTERIOVENTOUS (AV) GORETEX  GRAFT Right 07/27/2020   Procedure: INSERTION OF RIGHT ARM LOOP GRAFT WITH EXCISION OF RIGHT ARM BRACHIAL AXILLARY GRAFT;  Surgeon: Marty Heck, MD;  Location: [place];  Service: Vascular;  Laterality: Right;   UPPER EXTREMITY VENOGRAPHY Bilateral 06/14/2021   Procedure: UPPER EXTREMITY VENOGRAPHY;  Surgeon: Waynetta Sandy, MD;  Location: [place] CV LAB;  Service: Cardiovascular;  Laterality: Bilateral;   VISCERAL ANGIOGRAPHY Right 06/17/2020   Procedure: CENTRAL VENO;  Surgeon: Cherre Shamika Pedregon, MD;  Location: [place] CV LAB;  Service: Cardiovascular;  Laterality: Right;    Allergies  Allergen Reactions   Hydralazine Hcl Other (See Comments)    DRUG-INDUCED LUPUS    Prior to Admission medications   Medication Sig Start Date End Date Taking? Authorizing Provider  Accu-Chek Softclix Lancets lancets Use as instructed 06/11/21   Eppie Gibson, MD  acetaminophen (TYLENOL) 500 MG tablet Take 1,000 mg by mouth every 6 (six) hours as needed (for pain.).    [provider]  Blood Glucose Monitoring Suppl (ACCU-CHEK GUIDE ME) w/Device KIT 1 kit by Does not apply route daily. 05/22/21   Eppie Gibson, MD  calcium carbonate (TUMS - DOSED IN MG ELEMENTAL CALCIUM) 500 MG chewable tablet Chew 1,000 mg by mouth daily as needed for  indigestion or heartburn.    [provider]  cetirizine (ZYRTEC) 10 MG tablet Take 10 mg by mouth daily as needed for allergies.    [provider]  cinacalcet (SENSIPAR) 30 MG tablet Take 30 mg by mouth daily. 08/12/21   [provider]  ELIQUIS 5 MG TABS tablet Take 1 tablet by mouth twice daily 04/13/22   Dameron, Luna Fuse, DO  ferric citrate (AURYXIA) 1 GM 210 MG(Fe) tablet Take 420-840 mg by mouth See admin instructions. Take 4 tablets (840 mg) by mouth with each meal & take 2 tablets (420 mg) by mouth with each snack.    [provider]  gabapentin (NEURONTIN) 100 MG capsule TAKE 1 CAPSULE BY MOUTH THREE TIMES DAILY Patient taking differently: Take 100 mg by mouth See admin instructions. Take one capsule (100 mg) in the morning and two capsules (200 mg) in the evening. 04/11/22   Waynetta Sandy, MD  glucose blood (ACCU-CHEK GUIDE) test strip 1 each by Other  route 2 (two) times daily. Use as instructed 09/22/21   Wells Guiles, DO  LANTUS 100 UNIT/ML injection Inject 0.05 mLs (5 Units total) into the skin 2 (two) times daily. 03/11/22   Dameron, Luna Fuse, DO  levothyroxine (SYNTHROID) 150 MCG tablet TAKE 1 TABLET BY MOUTH ONCE DAILY BEFORE BREAKFAST 04/18/22   Dameron, Luna Fuse, DO  midodrine (PROAMATINE) 5 MG tablet Take 1 tablet (5 mg total) by mouth 3 (three) times daily with meals. Patient taking differently: Take 5 mg by mouth Every Tuesday,Thursday,and Saturday with dialysis. 05/14/21   Bonnielee Haff, MD  multivitamin (RENA-VIT) TABS tablet Take 1 tablet by mouth at bedtime. 10/25/16   Nicolette Bang, MD  oxyCODONE (ROXICODONE) 5 MG immediate release tablet Take 1 tablet (5 mg total) by mouth every 8 (eight) hours as needed. 04/20/22 04/20/23  Darci Current, DO  rosuvastatin (CRESTOR) 20 MG tablet Take 1 tablet by mouth once daily 01/08/22   Dameron, Luna Fuse, DO  Syringe, Disposable, (B-D SYRINGE SLIP TIP 30CC) 30 ML MISC 1 Syringe by Does  not apply route daily. 01/31/19   Wilber Oliphant, MD    Social History   Socioeconomic History   Marital status: Single    Spouse name: Not on file   Number of children: Not on file   Years of education: Not on file   Highest education level: Not on file  Occupational History   Not on file  Tobacco Use   Smoking status: Never   Smokeless tobacco: Never  Vaping Use   Vaping Use: Never used  Substance and Sexual Activity   Alcohol use: No   Drug use: Yes    Frequency: 2.0 times per week    Types: Marijuana    Comment: Last use was on 08/17/21   Sexual activity: Yes  Other Topics Concern   Not on file  Social History Narrative   Not on file   Social Determinants of Health   Financial Resource Strain: Not on file  Food Insecurity: No Food Insecurity (04/18/2022)   Hunger Vital Sign    Worried About Running Out of Food in the Last Year: Never true    Ran Out of Food in the Last Year: Never true  Transportation Needs: No Transportation Needs (04/18/2022)   PRAPARE - Hydrologist (Medical): No    Lack of Transportation (Non-Medical): No  Physical Activity: Insufficiently Active (07/07/2021)   Exercise Vital Sign    Days of Exercise per Week: 4 days    Minutes of Exercise per Session: 30 min  Stress: Stress Concern Present (08/06/2021)   [place]    Feeling of Stress : To some extent  Social Connections: Socially Isolated (06/07/2021)   Social Connection and Isolation Panel [NHANES]    Frequency of Communication with Friends and Family: More than three times a week    Frequency of Social Gatherings with Friends and Family: More than three times a week    Attends Religious Services: Never    Marine scientist or Organizations: No    Attends Archivist Meetings: Never    Marital Status: Never married  Intimate Partner Violence: Not At Risk (04/18/2022)   Humiliation,  Afraid, Rape, and Kick questionnaire    Fear of Current or Ex-Partner: No    Emotionally Abused: No    Physically Abused: No    Sexually Abused: No    Family History  Problem Relation Age  of Onset   Heart disease Mother    Hypertension Mother     ROS: Otherwise negative unless mentioned in HPI  Physical Examination  There were no vitals filed for this visit. There is no height or weight on file to calculate BMI.  General:  WDWN in NAD Gait: Not observed HENT: WNL, normocephalic Pulmonary: normal non-labored breathing Cardiac: regular Abdomen: soft, NT/ND, no masses Skin: without rashes Vascular Exam/Pulses: Nonpalpable pulses left wrist Extremities: without ischemic changes, without Gangrene , without cellulitis; without open wounds;  Musculoskeletal: no muscle wasting or atrophy  Neurologic: A&O X 3;  No focal weakness or paresthesias are detected; speech is fluent/normal Psychiatric:  The pt has Normal affect. Lymph:  Unremarkable  CBC    Component Value Date/Time   WBC 8.4 04/20/2022 0608   RBC 5.15 04/20/2022 0608   HGB 13.5 04/20/2022 0608   HCT 42.1 04/20/2022 0608   PLT 251 04/20/2022 0608   MCV 81.7 04/20/2022 0608   MCH 26.2 04/20/2022 0608   MCHC 32.1 04/20/2022 0608   RDW 17.3 (H) 04/20/2022 0608   LYMPHSABS 2.3 04/18/2022 1014   MONOABS 0.6 04/18/2022 1014   EOSABS 0.1 04/18/2022 1014   BASOSABS 0.1 04/18/2022 1014    BMET    Component Value Date/Time   NA 138 04/27/2022 1251   NA 139 06/14/2019 0914   K 4.2 04/27/2022 1251   CL 98 04/27/2022 1251   CO2 24 04/27/2022 1251   GLUCOSE 107 (H) 04/27/2022 1251   BUN 43 (H) 04/27/2022 1251   BUN 31 (H) 06/14/2019 0914   CREATININE 14.16 (H) 04/27/2022 1251   CREATININE 1.85 (H) 11/28/2014 1153   CALCIUM 8.8 (L) 04/27/2022 1251   GFRNONAA 4 (L) 04/27/2022 1251   GFRNONAA 51 (L) 11/28/2014 1153   GFRAA 6 (L) 06/14/2019 0914   GFRAA 58 (L) 11/28/2014 1153    COAGS: Lab Results  Component  Value Date   INR 1.3 (H) 05/06/2021   INR 1.44 09/11/2016      ASSESSMENT/PLAN: This is a 31 y.o. male with left upper extremity acute limb ischemia from thrombus in the brachial artery.   Thrombus was not amenable to thrombectomy.  Jaleal would benefit from left lower extremity brachial artery cutdown, embolectomy, in an effort to improve perfusion to the the hand.  More heparin was administered in IR.  I have made this case an emergency.  After discussing the risk and benefits of left arm brachial artery cutdown, [place] embolectomy, [place] is elected to proceed.   Cassandria Santee MD MS [facility] [phone] 05/04/2022  2:25 PM

## 2022-05-05 ENCOUNTER — Encounter (HOSPITAL_COMMUNITY): Payer: Self-pay | Admitting: Family Medicine

## 2022-05-05 ENCOUNTER — Other Ambulatory Visit (HOSPITAL_COMMUNITY): Payer: Self-pay

## 2022-05-05 DIAGNOSIS — Z794 Long term (current) use of insulin: Secondary | ICD-10-CM | POA: Diagnosis not present

## 2022-05-05 DIAGNOSIS — Z7982 Long term (current) use of aspirin: Secondary | ICD-10-CM | POA: Diagnosis not present

## 2022-05-05 DIAGNOSIS — I742 Embolism and thrombosis of arteries of the upper extremities: Secondary | ICD-10-CM | POA: Diagnosis not present

## 2022-05-05 DIAGNOSIS — I70208 Unspecified atherosclerosis of native arteries of extremities, other extremity: Secondary | ICD-10-CM | POA: Diagnosis not present

## 2022-05-05 DIAGNOSIS — N186 End stage renal disease: Secondary | ICD-10-CM | POA: Diagnosis not present

## 2022-05-05 DIAGNOSIS — E1122 Type 2 diabetes mellitus with diabetic chronic kidney disease: Secondary | ICD-10-CM | POA: Diagnosis not present

## 2022-05-05 DIAGNOSIS — Z992 Dependence on renal dialysis: Secondary | ICD-10-CM | POA: Diagnosis not present

## 2022-05-05 DIAGNOSIS — Z7901 Long term (current) use of anticoagulants: Secondary | ICD-10-CM | POA: Diagnosis not present

## 2022-05-05 DIAGNOSIS — I120 Hypertensive chronic kidney disease with stage 5 chronic kidney disease or end stage renal disease: Secondary | ICD-10-CM | POA: Diagnosis not present

## 2022-05-05 DIAGNOSIS — K219 Gastro-esophageal reflux disease without esophagitis: Secondary | ICD-10-CM | POA: Diagnosis not present

## 2022-05-05 DIAGNOSIS — E785 Hyperlipidemia, unspecified: Secondary | ICD-10-CM | POA: Diagnosis not present

## 2022-05-05 DIAGNOSIS — E039 Hypothyroidism, unspecified: Secondary | ICD-10-CM | POA: Diagnosis not present

## 2022-05-05 DIAGNOSIS — Z79899 Other long term (current) drug therapy: Secondary | ICD-10-CM | POA: Diagnosis not present

## 2022-05-05 LAB — RENAL FUNCTION PANEL
Albumin: 3.7 g/dL (ref 3.5–5.0)
Anion gap: 18 — ABNORMAL HIGH (ref 5–15)
BUN: 58 mg/dL — ABNORMAL HIGH (ref 6–20)
CO2: 19 mmol/L — ABNORMAL LOW (ref 22–32)
Calcium: 8.5 mg/dL — ABNORMAL LOW (ref 8.9–10.3)
Chloride: 98 mmol/L (ref 98–111)
Creatinine, Ser: 14.8 mg/dL — ABNORMAL HIGH (ref 0.61–1.24)
GFR, Estimated: 4 mL/min — ABNORMAL LOW (ref >60)
Glucose, Bld: 164 mg/dL — ABNORMAL HIGH (ref 70–99)
Phosphorus: 4.5 mg/dL (ref 2.5–4.6)
Potassium: 5.3 mmol/L — ABNORMAL HIGH (ref 3.5–5.1)
Sodium: 135 mmol/L (ref 135–145)

## 2022-05-05 LAB — CBC
HCT: 39.1 % (ref 39.0–52.0)
Hemoglobin: 13 g/dL (ref 13.0–17.0)
MCH: 27 pg (ref 26.0–34.0)
MCHC: 33.2 g/dL (ref 30.0–36.0)
MCV: 81.3 fL (ref 80.0–100.0)
Platelets: 210 10*3/uL (ref 150–400)
RBC: 4.81 MIL/uL (ref 4.22–5.81)
RDW: 18 % — ABNORMAL HIGH (ref 11.5–15.5)
WBC: 9.2 10*3/uL (ref 4.0–10.5)
nRBC: 0 % (ref 0.0–0.2)

## 2022-05-05 LAB — GLUCOSE, CAPILLARY
Glucose-Capillary: 180 mg/dL — ABNORMAL HIGH (ref 70–99)
Glucose-Capillary: 183 mg/dL — ABNORMAL HIGH (ref 70–99)
Glucose-Capillary: 220 mg/dL — ABNORMAL HIGH (ref 70–99)
Glucose-Capillary: 306 mg/dL — ABNORMAL HIGH (ref 70–99)

## 2022-05-05 LAB — HEPATITIS B SURFACE ANTIGEN: Hepatitis B Surface Ag: NONREACTIVE

## 2022-05-05 MED ORDER — HEPARIN SODIUM (PORCINE) 1000 UNIT/ML IJ SOLN
INTRAMUSCULAR | Status: AC
  Filled 2022-05-05: qty 6

## 2022-05-05 MED ORDER — ASPIRIN 81 MG PO TBEC
81.0000 mg | DELAYED_RELEASE_TABLET | Freq: Every day | ORAL | 0 refills | Status: DC
  Filled 2022-05-05: qty 90, 90d supply, fill #0

## 2022-05-05 MED ORDER — HEPARIN SODIUM (PORCINE) 1000 UNIT/ML IJ SOLN
12000.0000 [IU] | Freq: Once | INTRAMUSCULAR | Status: AC
  Administered 2022-05-05: 12000 [IU] via INTRAVENOUS
  Filled 2022-05-05: qty 12

## 2022-05-05 MED ORDER — ALTEPLASE 2 MG IJ SOLR: 2.0000 mg | Freq: Once | INTRAMUSCULAR | Status: DC | PRN

## 2022-05-05 MED ORDER — HEPARIN SODIUM (PORCINE) 1000 UNIT/ML DIALYSIS
1000.0000 [IU] | INTRAMUSCULAR | Status: DC | PRN
  Filled 2022-05-05: qty 1

## 2022-05-05 MED ORDER — MIDODRINE HCL 5 MG PO TABS
5.0000 mg | ORAL_TABLET | ORAL | Status: DC
  Administered 2022-05-05 (×2): 5 mg via ORAL
  Filled 2022-05-05 (×2): qty 1

## 2022-05-05 MED ORDER — HEPARIN SODIUM (PORCINE) 1000 UNIT/ML IJ SOLN
INTRAMUSCULAR | Status: AC
  Filled 2022-05-05: qty 12

## 2022-05-05 MED ORDER — LIDOCAINE HCL (PF) 1 % IJ SOLN: 5.0000 mL | INTRAMUSCULAR | Status: DC | PRN

## 2022-05-05 MED ORDER — PENTAFLUOROPROP-TETRAFLUOROETH EX AERO: 1.0000 | INHALATION_SPRAY | CUTANEOUS | Status: DC | PRN

## 2022-05-05 MED ORDER — MELATONIN 5 MG PO TABS
5.0000 mg | ORAL_TABLET | Freq: Every day | ORAL | Status: DC
  Administered 2022-05-05: 5 mg via ORAL
  Filled 2022-05-05 (×2): qty 1

## 2022-05-05 MED ORDER — LIDOCAINE-PRILOCAINE 2.5-2.5 % EX CREA: 1.0000 | TOPICAL_CREAM | CUTANEOUS | Status: DC | PRN

## 2022-05-05 MED ORDER — HEPARIN SODIUM (PORCINE) 1000 UNIT/ML IJ SOLN
6000.0000 [IU] | Freq: Once | INTRAMUSCULAR | Status: AC | PRN
  Administered 2022-05-05: 6000 [IU] via INTRAVENOUS

## 2022-05-05 NOTE — Progress Notes (Signed)
D/C order noted. Contacted [facility] [place] [facility] to advise clinic of pt's d/c today and that pt should resume care on Saturday.   Melven Sartorius Renal Navigator [phone]

## 2022-05-05 NOTE — Progress Notes (Signed)
[name] to be discharged home per MD Order. Discussed prescriptions and follow up appointments with patient. Medication list explained in detail. Patient understood and verbalized.  Skin clean dry and in tact. Without evidence of breakdown. Iv cath discontinued and intact. Dressing and pressure applied. Pt denies any pain at the site currently. No complaints noted. Patient free of lines drains and wounds other then this dialysis port.  An after visit summary was printed and given to the patient.  Patient escorted downstairs via wheelchair and was discharged home via uber.   Jovita Kussmaul LPN

## 2022-05-05 NOTE — Discharge Summary (Signed)
[facility] Discharge Summary  Patient name: Paul [name] Medical record number: [id number] Date of birth: February 01, 1991 Age: 31 y.o. Gender: male Date of Admission: 05/04/2022  Date of Discharge: 05/05/2022 Admitting Physician: Salvadore Oxford, MD  Primary Care Provider: Orvis Brill, DO Consultants: Nephrology, Vascular surgery, IR  Indication for Hospitalization: Arterial occlusion  Discharge Diagnoses/Problem List:  Principal Problem for Admission: Occlusion of L brachial artery with emergent embolectomy Other Problems addressed during stay:  Principal Problem:   Occlusion of left brachial artery [facility]) Active Problems:   Type 2 diabetes mellitus ([place])   ESRD on dialysis [facility])   Brief Hospital Course:  Paul [name] is a 31 y.o.male with a history of ESRD on HD T/Th/Sa, T2DM, GERD, HLD, HTN, hypothyroidism who was admitted to the [facility] Medicine Teaching Service at [facility] for embolectomy of L brachial artery after failed mechanical thrombectomy. His hospital course is detailed below:  Occlusion of left brachial artery  Patient presented to IR on 12/13 for mechanical thrombectomy of LUE AVG after multiple attempts at declotting. The procedure resulted in small amount of thrombus anterior to the left brachial artery, that was unable to be reached via mechanical thrombectomy. Patient had no pulse in left wrist and vascular surgery was emergently consulted. Patient received emergent L brachial artery cut down and embolectomy. Patient had unremarkable post-op course and was discharged the following morning.  ESRD Nephrology consulted and patient received HD on 12/14 via left tunneled dialysis catheter due to AVG clotting.  Other chronic conditions were medically managed with home medications and formulary alternatives as necessary (HLD, hypothyroidism)  PCP Follow-up Recommendations: Ensure vascular f/u for long term dialysis access Started on ASA 76m  daily per vascular recommendations in addition to his Eliquis   Disposition: Home  Discharge Condition: Stable  Discharge Exam:  Vitals:   05/05/22 0346 05/05/22 0900  BP: 104/71 (!) 156/113  Pulse: 83 85  Resp: 18 18  Temp: (!) 97.5 F (36.4 C) 97.7 F (36.5 C)  SpO2: 98% 99%   General: Well-appearing, sitting at bedside eating. NAD Cardiovascular: RRR without murmur Respiratory: CTAB. Normal WOB on RA. Abdomen: Soft, non-tender, non-distended Extremities: No peripheral edema MSK: L forearm with small incision on proximal forearm covered with surgical glue. No erythema or edema noted.  Significant Procedures: L brachial artery embolectomy  Significant Labs and Imaging:  Recent Labs  Lab 05/04/22 1450 05/05/22 0508  WBC  --  9.2  HGB 14.6 13.0  HCT 43.0 39.1  PLT  --  210   Recent Labs  Lab 05/04/22 1450 05/05/22 0508  NA 140 135  K 4.4 5.3*  CL 102 98  CO2  --  19*  GLUCOSE 102* 164*  BUN 43* 58*  CREATININE 14.80* 14.80*  CALCIUM  --  8.5*  PHOS  --  4.5  ALBUMIN  --  3.7    Pertinent Imaging: IR THROMBECTOMY AV FISTULA W/THROMBOLYSIS INC/SHUNT/IMG LEFT Result Date: 05/04/2022 IMPRESSION: 1. Failed declot of left brachial basilic graft. 2. Procedure complicated by migration of thrombus into left brachial artery resulting in occlusion. Vascular surgery consulted for thrombectomy. ACCESS: This access is not amenable to future percutaneous interventions as clinically indicated.    IR [place] Vasc Access Right Result Date: 05/04/2022 IMPRESSION: 1. Failed declot of left brachial basilic graft. 2. Procedure complicated by migration of thrombus into left brachial artery resulting in occlusion. Vascular surgery consulted for thrombectomy. ACCESS: This access is not amenable to future percutaneous interventions as clinically  indicated.    IR Fluoro Guide CV Line Right Result Date: 05/04/2022 IMPRESSION: 1. Failed declot of left brachial basilic graft. 2.  Procedure complicated by migration of thrombus into left brachial artery resulting in occlusion. Vascular surgery consulted for thrombectomy. ACCESS: This access is not amenable to future percutaneous interventions as clinically indicated.    HYBRID OR IMAGING ([facility] ONLY) Result Date: 05/04/2022 There is no interpretation for this exam.  This order is for images obtained during a surgical procedure.  Please See "Surgeries" Tab for more information regarding the procedure.    Results/Tests Pending at Time of Discharge: None  Discharge Medications:  Allergies as of 05/05/2022       Reactions   Hydralazine Hcl Other (See Comments)   DRUG-INDUCED LUPUS        Medication List     STOP taking these medications    oxyCODONE 5 MG immediate release tablet Commonly known as: Roxicodone       TAKE these medications    Accu-Chek Guide Me w/Device Kit 1 kit by Does not apply route daily.   Accu-Chek Guide test strip Generic drug: glucose blood 1 each by Other route 2 (two) times daily. Use as instructed   Accu-Chek Softclix Lancets lancets Use as instructed   acetaminophen 500 MG tablet Commonly known as: TYLENOL Take 1,000 mg by mouth every 6 (six) hours as needed (for pain.).   aspirin EC 81 MG tablet Take 1 tablet (81 mg total) by mouth daily. Swallow whole.   B-D SYRINGE SLIP TIP 30CC 30 ML Misc Generic drug: Syringe (Disposable) 1 Syringe by Does not apply route daily.   calcium carbonate 500 MG chewable tablet Commonly known as: TUMS - dosed in mg elemental calcium Chew 1,000 mg by mouth daily as needed for indigestion or heartburn.   cetirizine 10 MG tablet Commonly known as: ZYRTEC Take 10 mg by mouth daily as needed for allergies.   cinacalcet 30 MG tablet Commonly known as: SENSIPAR Take 30 mg by mouth daily.   Eliquis 5 MG Tabs tablet Generic drug: apixaban Take 1 tablet by mouth twice daily   ferric citrate 1 GM 210 MG(Fe) tablet Commonly known as:  AURYXIA Take 420-840 mg by mouth See admin instructions. Take 4 tablets (840 mg) by mouth with each meal & take 2 tablets (420 mg) by mouth with each snack.   gabapentin 100 MG capsule Commonly known as: NEURONTIN TAKE 1 CAPSULE BY MOUTH THREE TIMES DAILY What changed:  when to take this additional instructions   Lantus 100 UNIT/ML injection Generic drug: insulin glargine Inject 0.05 mLs (5 Units total) into the skin 2 (two) times daily.   levothyroxine 150 MCG tablet Commonly known as: SYNTHROID TAKE 1 TABLET BY MOUTH ONCE DAILY BEFORE BREAKFAST   midodrine 5 MG tablet Commonly known as: PROAMATINE Take 1 tablet (5 mg total) by mouth 3 (three) times daily with meals. What changed: when to take this   multivitamin Tabs tablet Take 1 tablet by mouth at bedtime.   rosuvastatin 20 MG tablet Commonly known as: CRESTOR Take 1 tablet by mouth once daily        Discharge Instructions: Please refer to Patient Instructions section of EMR for full details.  Patient was counseled important signs and symptoms that should prompt return to medical care, changes in medications, dietary instructions, activity restrictions, and follow up appointments.   Follow-Up Appointments:  Follow-up Information     [facility] Follow up in 3  week(s).   Specialty: Vascular Surgery Why: office will call to arrange Contact information: [place] [address] [phone]                Colletta Maryland, MD 05/05/2022, 3:21 PM PGY-1, [place] Upper-Level Resident Addendum   I have independently interviewed and examined the patient. I have discussed the above with Dr. Jerilee Hoh and agree with the documented plan. My edits for correction/addition/clarification are included above. Please see any attending notes.   Alcus Dad, MD PGY-3, [place] [facility] 05/05/2022 6:40 PM  FPTS Service  pager: [phone] (text pages welcome through [place])

## 2022-05-05 NOTE — Progress Notes (Signed)
  Transition of Care [facility]) Screening Note   Patient Details  Name: Paul [name] Date of Birth: [birth year]   Transition of [facility]) CM/SW Contact:    Tom-Johnson, Renea Ee, RN Phone Number: 05/05/2022, 11:56 AM  Transition of Care Department [facility]) has reviewed patient and no TOC needs or recommendations have been identified at this time. We will continue to monitor patient advancement through interdisciplinary progression rounds. If new patient transition needs arise, please place a TOC consult.

## 2022-05-05 NOTE — Progress Notes (Signed)
Asked to see for dialysis. Pt admitted for IR AVG declot, recurrent clotting issues. During the procedure w/ IR, there was thrombus that entered the arterial system and so [facility]VS took the patient for surgical brachial artery embolectomy of the L arm late afternoon which, along w/ TPA given in IR, was successful in removing the thrombus from the artery. Pt seen in room this am, he is alert and no distress. Exam w/o vol overload. Has R groin line placed yesterday that needs to be removed. Plan is for 2nd shift HD today on schedule. Dispo per primary team, stable for dc after HD from renal standpoint.   OP HD: [facility] 4.5h  450/ 1.5   147.5kg  2/2 bath  Hep 12000+ 6067midrun  LUE AVG/ IJ TDC - last HD 12/12, post wt 150.8kg - hectorol 2 ug IV tiw - no esa/ Fe, last hb 12.9  Kelly Splinter, MD 05/05/2022, 9:08 AM  Recent Labs  Lab 05/04/22 1450 05/05/22 0508  HGB 14.6 13.0  ALBUMIN  --  3.7  CALCIUM  --  8.5*  PHOS  --  4.5  CREATININE 14.80* 14.80*  K 4.4 5.3*

## 2022-05-05 NOTE — Progress Notes (Signed)
  Daily Progress Note  S/p:left arm embolectomy.   Subjective: Doing well. Sensorimotor intact. Pain well controlled   Objective: Vitals:   05/05/22 0346 05/05/22 0900  BP: 104/71 (!) 156/113  Pulse: 83 85  Resp: 18 18  Temp: (!) 97.5 F (36.4 C) 97.7 F (36.5 C)  SpO2: 98% 99%    Physical Examination Surgical site soft.  Signals at the wrist multiphasic  No sensory or motor deficits.    ASSESSMENT/PLAN:  Pt doing well s/p embolectomy.  AV graft needs to be abandoned.  Plan to see in office in 2 weeks. With arterial duplex. Okay to d/c from my standpoint. Please continue ASA 81mg . Noted coagulation defect when evaluating problem list.  Unsure what this is.  Can restart Eliquis and 48 hours if needed    Cassandria Santee MD MS [facility] [phone] 05/05/2022  2:37 PM

## 2022-05-05 NOTE — Progress Notes (Signed)
TOC CSW spoke with nurse 38M about pts transportation home.  CSW contacted pts sister, Jesiah Grismer [phone].  Brittny stated she would get pt an uber home.  Brittny stated she would contact pt with uber details.  Brandyn Thien Tarpley-Carter, MSW, LCSW-A Pronouns:  She/Her/Hers [facility] HealthTransitions of Care Clinical Social Worker Direct Number:  [phone] [email or web address]

## 2022-05-05 NOTE — Progress Notes (Signed)
Physical Therapy Note  Patient is functioning at a high level of independence and no physical therapy is indicated at this time. All patient questions regarding mobility and safety at d/c answered. PT is signing-off. Please re-order if there is any significant change in status. Thank you for this referral.  BERG balance score 53/56 (Low fall risk)  Ambulating without assistance, no evidence of instability.   [name], PT, DPT Physical Therapist Acute Rehabilitation Services [facility]

## 2022-05-05 NOTE — Progress Notes (Signed)
Daily Progress Note Intern Pager: [phone]  Patient name: Paul [name] Medical record number: [id number] Date of birth: 11-29-1990 Age: 31 y.o. Gender: male  Primary Care Provider: Orvis Brill, DO Consultants: Nephrology, Vascular surgery, IR Code Status: Full code  Pt Overview and Major Events to Date:  12/13: Admitted to FMTS service; L brachial embolectomy  Assessment and Plan: Kregg Cihlar is a 31 y.o. male presenting for admission after embolectomy of L brachial artery after failed mechanical thrombectomy. Pertinent PMHx includes ESRD on HD T/Th/Sa, T2DM, GERD, HLD, HTN, hypothyroidism.   * Occlusion of left brachial artery (HCC) Status post embolectomy of left brachial artery.  L arm with 2+ radial pulse, motor and sensation intact. -Vascular surgery following, will continue care outpatient -Aspirin 81 mg daily per Vascular -Continue home Eliquis 5 mg twice daily -Postsurgical pain: Sch Tylenol 650 q6h, Oxycodone 5 mg prn -PT/OT eval and treat  ESRD on dialysis (HCC) ESRD (TTS), plan for HD today. Left tunneled dialysis catheter due to previous AVG clotting. -Nephrology consulted, recommendations appreciated -Strict I&Os -Continue home meds: Sensipar 30 mg, Auryxia 420-840 mg and Rena-Vit 1  Type 2 diabetes mellitus (HCC) A1c 6.3. -vsSSI -CBGs with meals and night -Hold home Lantus 5U, add as back as necessary    Chronic Stable Conditions: Hyperlipidemia: continue Rosuvastatin 20 mg daily Hypothyroidism: continue Synthroid 150 mcg   FEN/GI: Renal diet and carb modified VTE Prophylaxis: Home Eliquis Dispo: Home pending specialist evaluation  Subjective:  Patient assessed at bedside while eating breakfast. States he feels well and only has pain at the incision site for his arm. States it otherwise feels well and he has full use of it. States he would like the femoral line to come out as it is uncomfortable.  Objective: Temp:  [97.5 F (36.4 C)-98 F  (36.7 C)] 97.7 F (36.5 C) (12/14 0900) Pulse Rate:  [79-98] 85 (12/14 0900) Resp:  [12-22] 18 (12/14 0900) BP: (104-156)/(68-113) 156/113 (12/14 0900) SpO2:  [94 %-100 %] 99 % (12/14 0900) Arterial Line BP: (103-135)/(62-77) 103/62 (12/13 1900) Weight:  [148 kg] 148 kg (12/13 1435) Physical Exam: General: Well-appearing, sitting at bedside eating. NAD Cardiovascular: RRR without murmur Respiratory: CTAB. Normal WOB on RA. Abdomen: Soft, non-tender, non-distended Extremities: No peripheral edema  Laboratory: Most recent CBC Lab Results  Component Value Date   WBC 9.2 05/05/2022   HGB 13.0 05/05/2022   HCT 39.1 05/05/2022   MCV 81.3 05/05/2022   PLT 210 05/05/2022   Most recent BMP    Latest Ref Rng & Units 05/05/2022    5:08 AM  BMP  Glucose 70 - 99 mg/dL 164   BUN 6 - 20 mg/dL 58   Creatinine 0.61 - 1.24 mg/dL 14.80   Sodium 135 - 145 mmol/L 135   Potassium 3.5 - 5.1 mmol/L 5.3   Chloride 98 - 111 mmol/L 98   CO2 22 - 32 mmol/L 19   Calcium 8.9 - 10.3 mg/dL 8.5     Other pertinent labs: none  Imaging/Diagnostic Tests: IR THROMBECTOMY AV FISTULA W/THROMBOLYSIS INC/SHUNT/IMG LEFT Result Date: 05/04/2022 IMPRESSION: 1. Failed declot of left brachial basilic graft. 2. Procedure complicated by migration of thrombus into left brachial artery resulting in occlusion. Vascular surgery consulted for thrombectomy. ACCESS: This access is not amenable to future percutaneous interventions as clinically indicated.   IR US Guide Vasc Access Right Result Date: 05/04/2022 IMPRESSION: 1. Failed declot of left brachial basilic graft. 2. Procedure complicated by migration of thrombus into  left brachial artery resulting in occlusion. Vascular surgery consulted for thrombectomy. ACCESS: This access is not amenable to future percutaneous interventions as clinically indicated.   IR Fluoro Guide CV Line Right Result Date: 05/04/2022 IMPRESSION: 1. Failed declot of left brachial basilic  graft. 2. Procedure complicated by migration of thrombus into left brachial artery resulting in occlusion. Vascular surgery consulted for thrombectomy. ACCESS: This access is not amenable to future percutaneous interventions as clinically indicated.   HYBRID OR IMAGING ([facility] ONLY) Result Date: 05/04/2022 There is no interpretation for this exam.  This order is for images obtained during a surgical procedure.  Please See "Surgeries" Tab for more information regarding the procedure.     Colletta Maryland, MD 05/05/2022, 1:52 PM  PGY-1, [place] Intern pager: [phone], text pages welcome Secure chat group [place]

## 2022-05-05 NOTE — Progress Notes (Signed)
TOC CSW was contacted by RN for transportation for pt.  CSW contacted pts sister, Brittny [phone].  Brittny stated she would pay for an [employer] Popper to transport pt home safely.  Somaya Grassi Tarpley-Carter, MSW, LCSW-A Pronouns:  She/Her/Hers [facility] Health[facility]itions of Care Clinical Social Worker Direct Number:  [phone] [email or web address]

## 2022-05-05 NOTE — Discharge Instructions (Addendum)
Dear [name],   Thank you for letting us participate in your care! In this section, you will find a brief hospital admission summary of why you were admitted to the hospital, what happened during your admission, your diagnosis/diagnoses, and recommended follow up.  Primary diagnosis: Occlusion of left brachial artery Treatment plan: You underwent declotting with vascular surgery emergently and were admitted for observation. Please follow up with vascular surgery.  POST-HOSPITAL & CARE INSTRUCTIONS We recommend following up with your PCP within 1 week from being discharged from the hospital. Please let PCP/Specialists know of any changes in medications that were made which you will be able to see in the medications section of this packet. Vascular surgery will contact you regarding follow-up plans on their part.  DOCTOR'S APPOINTMENTS & FOLLOW UP Future Appointments  Date Time Provider [place]  05/11/2022 10:10 AM Orvis Brill, DO [facility]  05/30/2022  9:30 AM Newt Minion, MD [facility] None     Thank you for choosing [facility]! Take care and be well!  [facility]  [place], [place] [address] [phone]

## 2022-05-05 NOTE — Procedures (Signed)
   I was present at this dialysis session, have reviewed the session itself and made  appropriate changes Kelly Splinter MD [place] pager [phone]   05/05/2022, 3:29 PM

## 2022-05-05 NOTE — Progress Notes (Signed)
OT Cancellation Note  Patient Details Name: Paul [name] MRN: [id number] DOB: Sep 12, 1990   Cancelled Treatment:    Reason Eval/Treat Not Completed: OT screened, no needs identified, will sign off Pt greeted in room, alert and oriented x4, able to perform all ADLs with MOD I, reports no conerns/questions regarding current functional status. Pt is at or close to baseline functioning re: ADL status. OT will screen and complete order. Please re consult if there is a change in functional status .  Shanon Payor, OTD OTR/L  05/05/22, 10:41 AM

## 2022-05-05 NOTE — Progress Notes (Signed)
Received patient in bed to unit.  Alert and oriented.  Informed consent signed and in chart.   Treatment initiated: [place] Treatment completed: 1915  Patient tolerated well.  Transported back to the room  Alert, without acute distress.  Hand-off given to patient's nurse.   Access used: Left TDC Access issues: none  Total UF removed: 5L Medication(s) given: tylenol 650    [name] [facility]    05/05/22 1934  Vitals  Temp 98.3 F (36.8 C)  Pulse Rate 79  Resp 16  BP 118/83  SpO2 98 %  O2 Device Room Air  Weight (!) 158.2 kg  Type of Weight Post-Dialysis  Oxygen Therapy  Patient Activity (if Appropriate) In bed  Post Treatment  Dialyzer Clearance Lightly streaked  Duration of HD Treatment -hour(s) 3.5 hour(s)  Hemodialysis Intake (mL) 300 mL  Liters Processed 84  Fluid Removed (mL) 5000 mL  Tolerated HD Treatment Yes

## 2022-05-06 ENCOUNTER — Other Ambulatory Visit (HOSPITAL_COMMUNITY): Payer: Self-pay

## 2022-05-06 LAB — HEPATITIS B SURFACE ANTIBODY, QUANTITATIVE: Hep B S AB Quant (Post): 40.3 m[IU]/mL (ref >9.9)

## 2022-05-08 ENCOUNTER — Other Ambulatory Visit (HOSPITAL_COMMUNITY): Payer: Self-pay | Admitting: Interventional Radiology

## 2022-05-08 ENCOUNTER — Encounter (HOSPITAL_COMMUNITY): Payer: Self-pay

## 2022-05-08 DIAGNOSIS — N186 End stage renal disease: Secondary | ICD-10-CM

## 2022-05-08 DIAGNOSIS — Z992 Dependence on renal dialysis: Secondary | ICD-10-CM

## 2022-05-09 ENCOUNTER — Telehealth: Payer: Self-pay | Admitting: *Deleted

## 2022-05-09 ENCOUNTER — Other Ambulatory Visit: Payer: Self-pay | Admitting: Vascular Surgery

## 2022-05-09 ENCOUNTER — Other Ambulatory Visit: Payer: Self-pay | Admitting: Physician Assistant

## 2022-05-09 MED ORDER — OXYCODONE-ACETAMINOPHEN 5-325 MG PO TABS: 1.0000 | ORAL_TABLET | Freq: Four times a day (QID) | ORAL | 0 refills | Status: DC | PRN

## 2022-05-09 NOTE — Telephone Encounter (Signed)
Patient called stating he is having pain from his recent procedure. He is taking tylenol and ASA with no improvement. He is requesting something for pain. Spoke to [place] PA he will send in a small amount of pain medication for patient. Notified patient Rx sent to patient pharmacy.

## 2022-05-11 ENCOUNTER — Ambulatory Visit (INDEPENDENT_AMBULATORY_CARE_PROVIDER_SITE_OTHER): Payer: Medicaid Other | Admitting: Student

## 2022-05-11 ENCOUNTER — Other Ambulatory Visit: Payer: Self-pay | Admitting: Student

## 2022-05-11 ENCOUNTER — Encounter: Payer: Self-pay | Admitting: Student

## 2022-05-11 VITALS — BP 126/68 | HR 86 | Ht 74.0 in | Wt 341.0 lb

## 2022-05-11 DIAGNOSIS — E785 Hyperlipidemia, unspecified: Secondary | ICD-10-CM

## 2022-05-11 DIAGNOSIS — E1129 Type 2 diabetes mellitus with other diabetic kidney complication: Secondary | ICD-10-CM | POA: Diagnosis not present

## 2022-05-11 DIAGNOSIS — I70208 Unspecified atherosclerosis of native arteries of extremities, other extremity: Secondary | ICD-10-CM

## 2022-05-11 DIAGNOSIS — D689 Coagulation defect, unspecified: Secondary | ICD-10-CM

## 2022-05-11 DIAGNOSIS — E039 Hypothyroidism, unspecified: Secondary | ICD-10-CM

## 2022-05-11 DIAGNOSIS — Z794 Long term (current) use of insulin: Secondary | ICD-10-CM

## 2022-05-11 MED ORDER — MIDODRINE HCL 5 MG PO TABS: 5.0000 mg | ORAL_TABLET | ORAL | 3 refills | Status: DC

## 2022-05-11 MED ORDER — RENA-VITE PO TABS: 1.0000 | ORAL_TABLET | Freq: Every day | ORAL | 0 refills | Status: AC

## 2022-05-11 MED ORDER — FERRIC CITRATE 1 GM 210 MG(FE) PO TABS: 420.0000 mg | ORAL_TABLET | ORAL | 0 refills | Status: DC

## 2022-05-11 MED ORDER — LEVOTHYROXINE SODIUM 150 MCG PO TABS: 150.0000 ug | ORAL_TABLET | Freq: Every day | ORAL | 0 refills | Status: DC

## 2022-05-11 MED ORDER — ASPIRIN 81 MG PO TBEC: 81.0000 mg | DELAYED_RELEASE_TABLET | Freq: Every day | ORAL | 0 refills | Status: DC

## 2022-05-11 MED ORDER — GABAPENTIN 300 MG PO CAPS: 300.0000 mg | ORAL_CAPSULE | Freq: Every day | ORAL | 0 refills | Status: DC

## 2022-05-11 MED ORDER — CETIRIZINE HCL 10 MG PO TABS: 10.0000 mg | ORAL_TABLET | Freq: Every day | ORAL | 0 refills | Status: DC | PRN

## 2022-05-11 MED ORDER — ROSUVASTATIN CALCIUM 20 MG PO TABS: 20.0000 mg | ORAL_TABLET | Freq: Every day | ORAL | 0 refills | Status: DC

## 2022-05-11 MED ORDER — APIXABAN 5 MG PO TABS: 5.0000 mg | ORAL_TABLET | Freq: Two times a day (BID) | ORAL | 0 refills | Status: DC

## 2022-05-11 MED ORDER — LANTUS 100 UNIT/ML ~~LOC~~ SOLN: 5.0000 [IU] | Freq: Two times a day (BID) | SUBCUTANEOUS | 3 refills | Status: DC

## 2022-05-11 NOTE — Progress Notes (Signed)
    SUBJECTIVE:   CHIEF COMPLAINT / HPI:   Paul [name] is here for hospital follow-up after he was admitted for occlusion of left brachial artery with emergent embolectomy.  He has had quite a journey in the past year with nephrology and vascular surgery and his dialysis set up.  Paul [name] has had multiple clotting difficulties.  Today, he says he is feeling generally well.  He is taking 7 units of insulin twice daily.  Sometimes he does have CBGs in the 80s where he feels symptomatic where he is clammy, shaky.  He says when he eats at nighttime, his sugars "go sky high"  He would like to increase his gabapentin for pain he is feeling in his back and his arms.  He had dialysis yesterday, tolerated a full session.  He is scheduled to have dialysis again on Thursday per his (T/TH/S) schedule.  He reports compliance with his 81 mg aspirin daily as well as Eliquis 5 mg daily.  He has a tunneled dialysis catheter he is using right now for HD, but wants to know if his left AV graft he will be able to use, he wants me to reach out to Dr. Virl Cagey and ask about this.   PERTINENT  PMH / PSH: Reviewed  OBJECTIVE:   BP 126/68   Pulse 86   Ht 6\' 2"  (1.88 m)   Wt (!) 341 lb (154.7 kg)   SpO2 100%   BMI 43.78 kg/m  General: Well-appearing, no distress CV: Regular rate and rhythm Respiratory: Normal work of breathing on room air, no wheezing or crackles Left extremity: Multiple well-healed scars over the arm, palpable thrill over AV fistula in left arm Skin: Warm and dry Psych: Normal mood and affect  ASSESSMENT/PLAN:   Occlusion of left brachial artery (HCC) Resolved s/p embolectomy.  Distal pulses palpable. Has vascular surgery appointment coming up in January. 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 I discussed the risks  of hypoglycemia, especially in setting of dialysis patient. He has consistent CBGs 80 or lower where he is symptomatic, he is to cut back his insulin to 5 units twice daily and let me know.   Paul [name], [place]

## 2022-05-11 NOTE — Patient Instructions (Signed)
It was great seeing you today.  We increased your nighttime dose of Gabapentin to 300 mg. I placed a referral to the blood specialists (hematology) for your recurrent clotting.  Continue to take Aspirin 81 mg daily along with your Eliquis.  If you are having persistent low blood sugars (70 or less) and you feel bad from it, decrease your insulin by 2 units.  I will reach out to Dr. Virl Cagey about your graft and let you know what he says.  We collected testing today- I will call you if it is abnormal. If your results are normal, I will send you a MyChart message.   If you have any questions or concerns, please feel free to call the clinic.   Have a wonderful day,  Dr. Orvis Brill [facility] [phone]

## 2022-05-12 ENCOUNTER — Other Ambulatory Visit: Payer: Self-pay | Admitting: *Deleted

## 2022-05-12 DIAGNOSIS — N186 End stage renal disease: Secondary | ICD-10-CM

## 2022-05-12 LAB — CBC
Hematocrit: 39.2 % (ref 37.5–51.0)
Hemoglobin: 13.2 g/dL (ref 13.0–17.7)
MCH: 26.8 pg (ref 26.6–33.0)
MCHC: 33.7 g/dL (ref 31.5–35.7)
MCV: 80 fL (ref 79–97)
Platelets: 305 10*3/uL (ref 150–450)
RBC: 4.93 x10E6/uL (ref 4.14–5.80)
RDW: 17.8 % — ABNORMAL HIGH (ref 11.6–15.4)
WBC: 10.7 10*3/uL (ref 3.4–10.8)

## 2022-05-12 NOTE — Assessment & Plan Note (Signed)
Paul [name] is taking 7 units Lantus twice daily. I discussed the risks of hypoglycemia, especially in setting of dialysis patient. He has consistent CBGs 80 or lower where he is symptomatic, he is to cut back his insulin to 5 units twice daily and let me know.

## 2022-05-12 NOTE — Assessment & Plan Note (Addendum)
Resolved s/p embolectomy.  Distal pulses palpable. Has vascular surgery appointment coming up in January. -Continue aspirin 81 mg daily -Continue Eliquis 5 mg twice daily -CBC today -Hematology consult placed today for recurrent clotting

## 2022-05-18 ENCOUNTER — Encounter: Payer: Self-pay | Admitting: Vascular Surgery

## 2022-05-22 DIAGNOSIS — Z992 Dependence on renal dialysis: Secondary | ICD-10-CM | POA: Diagnosis not present

## 2022-05-22 DIAGNOSIS — N186 End stage renal disease: Secondary | ICD-10-CM | POA: Diagnosis not present

## 2022-05-22 DIAGNOSIS — E1129 Type 2 diabetes mellitus with other diabetic kidney complication: Secondary | ICD-10-CM | POA: Diagnosis not present

## 2022-05-27 ENCOUNTER — Ambulatory Visit (INDEPENDENT_AMBULATORY_CARE_PROVIDER_SITE_OTHER)
Admission: RE | Admit: 2022-05-27 | Discharge: 2022-05-27 | Disposition: A | Discharge status: Home / Self Care | Payer: Medicaid Other | Source: Ambulatory Visit | Attending: Vascular Surgery | Admitting: Vascular Surgery

## 2022-05-27 ENCOUNTER — Ambulatory Visit (HOSPITAL_COMMUNITY)
Admission: RE | Admit: 2022-05-27 | Discharge: 2022-05-27 | Disposition: A | Discharge status: Home / Self Care | Payer: Medicaid Other | Source: Ambulatory Visit | Attending: Vascular Surgery | Admitting: Vascular Surgery

## 2022-05-27 ENCOUNTER — Ambulatory Visit (INDEPENDENT_AMBULATORY_CARE_PROVIDER_SITE_OTHER): Payer: Medicaid Other | Admitting: Vascular Surgery

## 2022-05-27 ENCOUNTER — Encounter: Payer: Self-pay | Admitting: Vascular Surgery

## 2022-05-27 VITALS — BP 129/82 | HR 92 | Temp 97.9°F | Resp 20 | Ht 74.0 in | Wt 340.0 lb

## 2022-05-27 DIAGNOSIS — N186 End stage renal disease: Secondary | ICD-10-CM | POA: Insufficient documentation

## 2022-05-27 DIAGNOSIS — I742 Embolism and thrombosis of arteries of the upper extremities: Secondary | ICD-10-CM

## 2022-05-27 DIAGNOSIS — Z992 Dependence on renal dialysis: Secondary | ICD-10-CM

## 2022-05-27 NOTE — Progress Notes (Signed)
Office Note    HPI: Paul [name] is a 32 y.o. 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  On exam today, Paul [name] was doing well, accompanied by his mother.  He had no complaints.  Denies sensorimotor deficits in the left arm, denies wound healing issues.  Currently being dialyzed through a tunneled dialysis catheter.  Past Medical History:  Diagnosis Date   Acute hypoxemic respiratory failure (HCC) 09/11/2016   Anemia    Chronic kidney disease    ARF on CRF Dialysis T/TH/Sa   Diabetes mellitus    Type II   End stage renal disease on dialysis (HCC)    [place]    GERD (gastroesophageal reflux disease)    diet controlled   HLD (hyperlipidemia)    Hypertension    Hypothyroidism    Morbid obesity (HCC)    Sacral wound    resolved   Thyroid disease     Past Surgical History:  Procedure Laterality Date   AMPUTATION Left 05/08/2021   Procedure: AMPUTATION GREAT TOE;  Surgeon: Nadara Mustard, MD;  Location: [facility] OR;  Service: Orthopedics;  Laterality: Left;   AV FISTULA PLACEMENT Left 09/26/2016   Procedure: LEFT UPPER ARM ARTERIOVENOUS (AV) FISTULA CREATION;  Surgeon: Sherren Kerns, MD;  Location: [facility] OR;  Service: Vascular;  Laterality: Left;   AV FISTULA PLACEMENT Left 12/02/2016   Procedure: INSERTION OF ARTERIOVENOUS GORE-TEX GRAFT LEFT UPPER  ARM USING A 4-7MM BY 45CM GRAFT ;  Surgeon: Larina Earthly, MD;  Location: [facility] OR;  Service: Vascular;  Laterality: Left;   AV FISTULA PLACEMENT Right 07/03/2020   Procedure: INSERTION OF ARTERIOVENOUS (AV) GORE-TEX GRAFT RIGHT UPPER ARM;  Surgeon: Cephus Shelling, MD;  Location: [facility] OR;  Service: Vascular;  Laterality: Right;   AV FISTULA PLACEMENT Left 07/02/2021   Procedure: LEFT UPPER EXTREMITY ARTERIOVENOUS FISTULA CREATION;  Surgeon: Maeola Harman, MD;  Location: [facility] OR;  Service: Vascular;   Laterality: Left;   BASCILIC VEIN TRANSPOSITION Right 05/06/2020   Procedure: FIRST STAGE RIGHT BASCILIC VEIN TRANSPOSITION;  Surgeon: Nada Libman, MD;  Location: [facility] OR;  Service: Vascular;  Laterality: Right;   BASCILIC VEIN TRANSPOSITION Left 08/20/2021   Procedure: LEFT ARM SECOND STAGE WITH ARTERIOVENOUS GRAFT;  Surgeon: Maeola Harman, MD;  Location: [facility] OR;  Service: Vascular;  Laterality: Left;   EXCHANGE OF A DIALYSIS CATHETER Right 10/08/2016   Procedure: EXCHANGE OF A DIALYSIS CATHETER-RIGHT INTERNAL JUGULAR;  Surgeon: Fransisco Hertz, MD;  Location: [facility] OR;  Service: Vascular;  Laterality: Right;   INSERTION OF DIALYSIS CATHETER Right 09/26/2016   Procedure: INSERTION OF DIALYSIS CATHETER - Right Internal Jugular Placement;  Surgeon: Sherren Kerns, MD;  Location: [facility] OR;  Service: Vascular;  Laterality: Right;   INSERTION OF DIALYSIS CATHETER Left 03/22/2018   Procedure: INSERTION OF DIALYSIS CATHETER;  Surgeon: Cephus Shelling, MD;  Location: [facility] OR;  Service: Vascular;  Laterality: Left;   INTRAOPERATIVE ARTERIOGRAM Left 05/04/2022   Procedure: INTRA OPERATIVE ARTERIOGRAM;  Surgeon: Victorino Sparrow, MD;  Location: [facility] OR;  Service: Vascular;  Laterality: Left;   IR DIALY SHUNT INTRO NEEDLE/INTRACATH INITIAL W/IMG LEFT Left 04/15/2022   IR FLUORO GUIDE CV LINE LEFT  04/19/2022   IR FLUORO GUIDE CV LINE RIGHT  03/26/2021   IR FLUORO GUIDE CV LINE RIGHT  05/13/2021   IR FLUORO GUIDE CV LINE RIGHT  05/04/2022  IR REMOVAL TUN CV CATH W/O FL  05/11/2021   IR REMOVAL TUN CV CATH W/O FL  10/06/2021   IR THROMBECTOMY AV FISTULA W/THROMBOLYSIS/PTA INC/SHUNT/IMG LEFT Left 02/02/2022   IR THROMBECTOMY AV FISTULA W/THROMBOLYSIS/PTA INC/SHUNT/IMG LEFT Left 03/22/2022   IR THROMBECTOMY AV FISTULA W/THROMBOLYSIS/PTA INC/SHUNT/IMG LEFT Left 05/04/2022   IR THROMBECTOMY AV FISTULA W/THROMBOLYSIS/PTA/STENT INC/SHUNT/IMG LT Left 04/27/2022   IR US GUIDE VASC ACCESS LEFT  02/02/2022   IR  US GUIDE VASC ACCESS LEFT  03/22/2022   IR US GUIDE VASC ACCESS LEFT  04/15/2022   IR US GUIDE VASC ACCESS LEFT  04/19/2022   IR US GUIDE VASC ACCESS LEFT  04/27/2022   IR US GUIDE VASC ACCESS RIGHT  03/26/2021   IR US GUIDE VASC ACCESS RIGHT  05/13/2021   IR US GUIDE VASC ACCESS RIGHT  05/04/2022   REVISON OF ARTERIOVENOUS FISTULA Left 03/22/2018   Procedure: LEFT UPPER EXTREMITY ARTERIOVENOUS  REVISION WITH GORE-TEX GRAFT.;  Surgeon: Cephus Shelling, MD;  Location: [facility] OR;  Service: Vascular;  Laterality: Left;   TEE WITHOUT CARDIOVERSION N/A 05/12/2021   Procedure: TRANSESOPHAGEAL ECHOCARDIOGRAM (TEE);  Surgeon: Christell Constant, MD;  Location: [facility] ENDOSCOPY;  Service: Cardiovascular;  Laterality: N/A;   THROMBECTOMY Left 08/26/2019   thrombectomy of LUA loop AVG   THROMBECTOMY AND REVISION OF ARTERIOVENTOUS (AV) GORETEX  GRAFT Right 07/27/2020   Procedure: INSERTION OF RIGHT ARM LOOP GRAFT WITH EXCISION OF RIGHT ARM BRACHIAL AXILLARY GRAFT;  Surgeon: Cephus Shelling, MD;  Location: [facility] OR;  Service: Vascular;  Laterality: Right;   THROMBECTOMY BRACHIAL ARTERY Left 05/04/2022   Procedure: LEFT ARM RADIAL BRACHIAL ARTERY EMBOLECTOMY;  Surgeon: Victorino Sparrow, MD;  Location: [facility] OR;  Service: Vascular;  Laterality: Left;   UPPER EXTREMITY VENOGRAPHY Bilateral 06/14/2021   Procedure: UPPER EXTREMITY VENOGRAPHY;  Surgeon: Maeola Harman, MD;  Location: [facility] INVASIVE CV LAB;  Service: Cardiovascular;  Laterality: Bilateral;   VISCERAL ANGIOGRAPHY Right 06/17/2020   Procedure: CENTRAL VENO;  Surgeon: Leonie Douglas, MD;  Location: [facility] INVASIVE CV LAB;  Service: Cardiovascular;  Laterality: Right;    Social History   Socioeconomic History   Marital status: Single    Spouse name: Not on file   Number of children: Not on file   Years of education: Not on file   Highest education level: Not on file  Occupational History   Not on file  Tobacco Use   Smoking status: Never    Smokeless tobacco: Never  Vaping Use   Vaping Use: Never used  Substance and Sexual Activity   Alcohol use: No   Drug use: Yes    Frequency: 2.0 times per week    Types: Marijuana    Comment: Last use was on 08/17/21   Sexual activity: Yes  Other Topics Concern   Not on file  Social History Narrative   Not on file   Social Determinants of Health   Financial Resource Strain: Not on file  Food Insecurity: No Food Insecurity (05/04/2022)   Hunger Vital Sign    Worried About Running Out of Food in the Last Year: Never true    Ran Out of Food in the Last Year: Never true  Transportation Needs: No Transportation Needs (05/04/2022)   PRAPARE - Administrator, Civil Service (Medical): No    Lack of Transportation (Non-Medical): No  Physical Activity: Insufficiently Active (07/07/2021)   Exercise Vital Sign    Days of Exercise per Week: 4 days  Minutes of Exercise per Session: 30 min  Stress: Stress Concern Present (08/06/2021)   [employer] of Occupational Health - Occupational Stress Questionnaire    Feeling of Stress : To some extent  Social Connections: Socially Isolated (06/07/2021)   Social Connection and Isolation Panel [NHANES]    Frequency of Communication with Friends and Family: More than three times a week    Frequency of Social Gatherings with Friends and Family: More than three times a week    Attends Religious Services: Never    Database administrator or Organizations: No    Attends Banker Meetings: Never    Marital Status: Never married  Intimate Partner Violence: Not At Risk (05/04/2022)   Humiliation, Afraid, Rape, and Kick questionnaire    Fear of Current or Ex-Partner: No    Emotionally Abused: No    Physically Abused: No    Sexually Abused: No   Family History  Problem Relation Age of Onset   Heart disease Mother    Hypertension Mother     Current Outpatient Medications  Medication Sig Dispense Refill   Accu-Chek  Softclix Lancets lancets Use as instructed 100 each 12   acetaminophen (TYLENOL) 500 MG tablet Take 1,000 mg by mouth every 6 (six) hours as needed (for pain.).     apixaban (ELIQUIS) 5 MG TABS tablet Take 1 tablet (5 mg total) by mouth 2 (two) times daily. 60 tablet 0   aspirin EC 81 MG tablet Take 1 tablet (81 mg total) by mouth daily. Swallow whole. 90 tablet 0   Blood Glucose Monitoring Suppl (ACCU-CHEK GUIDE ME) w/Device KIT 1 kit by Does not apply route daily. 1 kit 0   calcium carbonate (TUMS - DOSED IN MG ELEMENTAL CALCIUM) 500 MG chewable tablet Chew 1,000 mg by mouth daily as needed for indigestion or heartburn.     cetirizine (ZYRTEC) 10 MG tablet Take 1 tablet (10 mg total) by mouth daily as needed for allergies. 30 tablet 0   cinacalcet (SENSIPAR) 30 MG tablet Take 30 mg by mouth daily.     ferric citrate (AURYXIA) 1 GM 210 MG(Fe) tablet Take 2-4 tablets (420-840 mg total) by mouth See admin instructions. Take 4 tablets (840 mg) by mouth with each meal & take 2 tablets (420 mg) by mouth with each snack. 270 tablet 0   gabapentin (NEURONTIN) 100 MG capsule TAKE 1 CAPSULE BY MOUTH THREE TIMES DAILY 90 capsule 0   gabapentin (NEURONTIN) 300 MG capsule Take 1 capsule (300 mg total) by mouth at bedtime. 30 capsule 0   glucose blood (ACCU-CHEK GUIDE) test strip 1 each by Other route 2 (two) times daily. Use as instructed 100 each 12   LANTUS 100 UNIT/ML injection Inject 0.05 mLs (5 Units total) into the skin 2 (two) times daily. 10 mL 3   levothyroxine (SYNTHROID) 150 MCG tablet Take 1 tablet (150 mcg total) by mouth daily before breakfast. 30 tablet 0   midodrine (PROAMATINE) 5 MG tablet Take 1 tablet (5 mg total) by mouth Every Tuesday,Thursday,and Saturday with dialysis. 30 tablet 3   multivitamin (RENA-VIT) TABS tablet Take 1 tablet by mouth at bedtime. 30 tablet 0   oxyCODONE-acetaminophen (PERCOCET) 5-325 MG tablet Take 1 tablet by mouth every 6 (six) hours as needed for severe pain. 10  tablet 0   rosuvastatin (CRESTOR) 20 MG tablet Take 1 tablet (20 mg total) by mouth daily. 90 tablet 0   Syringe, Disposable, (B-D SYRINGE SLIP TIP 30CC) 30 ML  MISC 1 Syringe by Does not apply route daily. 30 each 11   No current facility-administered medications for this visit.    Allergies  Allergen Reactions   Hydralazine Hcl Other (See Comments)    DRUG-INDUCED LUPUS     REVIEW OF SYSTEMS:  [X]  denotes positive finding, [ ]  denotes negative finding Cardiac  Comments:  Chest pain or chest pressure:    Shortness of breath upon exertion:    Short of breath when lying flat:    Irregular heart rhythm:        Vascular    Pain in calf, thigh, or hip brought on by ambulation:    Pain in feet at night that wakes you up from your sleep:     Blood clot in your veins:    Leg swelling:         Pulmonary    Oxygen at home:    Productive cough:     Wheezing:         Neurologic    Sudden weakness in arms or legs:     Sudden numbness in arms or legs:     Sudden onset of difficulty speaking or slurred speech:    Temporary loss of vision in one eye:     Problems with dizziness:         Gastrointestinal    Blood in stool:     Vomited blood:         Genitourinary    Burning when urinating:     Blood in urine:        Psychiatric    Major depression:         Hematologic    Bleeding problems:    Problems with blood clotting too easily:        Skin    Rashes or ulcers:        Constitutional    Fever or chills:      PHYSICAL EXAMINATION:  Vitals:   05/27/22 0929  BP: 129/82  Pulse: 92  Resp: 20  Temp: 97.9 F (36.6 C)  SpO2: 98%  Weight: (!) 340 lb (154.2 kg)  Height: 6\' 2"  (1.88 m)    General:  WDWN in NAD; vital signs documented above, obese Gait: Not observed HENT: WNL, normocephalic Pulmonary: normal non-labored breathing , without wheezing Cardiac: regular HR Abdomen: soft, NT, no masses Skin: without rashes Vascular Exam/Pulses:  Right Left  Radial  2+ (normal) 2+ (normal)                       Extremities: without ischemic changes, without Gangrene , without cellulitis; without open wounds;  Musculoskeletal: no muscle wasting or atrophy  Neurologic: A&O X 3;  No focal weakness or paresthesias are detected Psychiatric:  The pt has Normal affect.   Non-Invasive Vascular Imaging:   Normal left upper extremity blood flow appreciated    ASSESSMENT/PLAN: Kadence Akiyama is a 32 y.o. male presenting status post left brachial artery embolectomy after fistulogram led to brachial artery thrombus.  On exam, Raheim has recovered well.  He denies sensorimotor deficits in the hand.  Imaging demonstrates excellent flow throughout the left upper extremity.  The left AV graft is patent, and on exam had some light pulsatility.  Terance would like to access it in an effort to see if it could function still for dialysis.  I initially discussed with him getting an ultrasound to see what flows were prior to access, however I do not  think attempted access should have any major consequences.  My guess is, that Cullen may require repeat fistulogram in an effort to continue to use the fistula.  I am surprised that it is currently open.   I asked him to call my office should any issues with cannulation occur as I am happy to assist in keeping the fistula patent.  I will also forward this note to Dr. Bryn Gulling who has worked diligently to keep this fistula patent.   Victorino Sparrow, MD [facility] [phone]

## 2022-05-30 ENCOUNTER — Other Ambulatory Visit (HOSPITAL_COMMUNITY): Payer: Self-pay | Admitting: Internal Medicine

## 2022-05-30 ENCOUNTER — Ambulatory Visit (HOSPITAL_COMMUNITY)
Admission: RE | Admit: 2022-05-30 | Discharge: 2022-05-30 | Disposition: A | Discharge status: Home / Self Care | Payer: Medicaid Other | Source: Ambulatory Visit | Attending: Internal Medicine | Admitting: Internal Medicine

## 2022-05-30 ENCOUNTER — Ambulatory Visit (INDEPENDENT_AMBULATORY_CARE_PROVIDER_SITE_OTHER): Payer: Medicaid Other | Admitting: Orthopedic Surgery

## 2022-05-30 DIAGNOSIS — B351 Tinea unguium: Secondary | ICD-10-CM | POA: Diagnosis not present

## 2022-05-30 DIAGNOSIS — Y848 Other medical procedures as the cause of abnormal reaction of the patient, or of later complication, without mention of misadventure at the time of the procedure: Secondary | ICD-10-CM | POA: Diagnosis not present

## 2022-05-30 DIAGNOSIS — N186 End stage renal disease: Secondary | ICD-10-CM | POA: Diagnosis not present

## 2022-05-30 DIAGNOSIS — I120 Hypertensive chronic kidney disease with stage 5 chronic kidney disease or end stage renal disease: Secondary | ICD-10-CM | POA: Diagnosis not present

## 2022-05-30 DIAGNOSIS — Z992 Dependence on renal dialysis: Secondary | ICD-10-CM | POA: Diagnosis not present

## 2022-05-30 DIAGNOSIS — E1122 Type 2 diabetes mellitus with diabetic chronic kidney disease: Secondary | ICD-10-CM | POA: Insufficient documentation

## 2022-05-30 DIAGNOSIS — T8241XA Breakdown (mechanical) of vascular dialysis catheter, initial encounter: Secondary | ICD-10-CM | POA: Diagnosis present

## 2022-05-30 DIAGNOSIS — Z794 Long term (current) use of insulin: Secondary | ICD-10-CM | POA: Insufficient documentation

## 2022-05-30 DIAGNOSIS — Z89432 Acquired absence of left foot: Secondary | ICD-10-CM | POA: Diagnosis not present

## 2022-05-30 DIAGNOSIS — T8249XA Other complication of vascular dialysis catheter, initial encounter: Secondary | ICD-10-CM | POA: Diagnosis not present

## 2022-05-30 DIAGNOSIS — L97511 Non-pressure chronic ulcer of other part of right foot limited to breakdown of skin: Secondary | ICD-10-CM | POA: Diagnosis not present

## 2022-05-30 DIAGNOSIS — L97521 Non-pressure chronic ulcer of other part of left foot limited to breakdown of skin: Secondary | ICD-10-CM | POA: Diagnosis not present

## 2022-05-30 HISTORY — PX: IR FLUORO GUIDE CV LINE LEFT: IMG2282

## 2022-05-30 MED ORDER — LIDOCAINE HCL 1 % IJ SOLN
INTRAMUSCULAR | Status: AC
  Administered 2022-05-30: 10 mL
  Filled 2022-05-30: qty 20

## 2022-05-30 MED ORDER — CHLORHEXIDINE GLUCONATE 4 % EX LIQD
CUTANEOUS | Status: AC
  Filled 2022-05-30: qty 15

## 2022-05-30 MED ORDER — CEFAZOLIN SODIUM-DEXTROSE 2-4 GM/100ML-% IV SOLN
INTRAVENOUS | Status: AC
  Administered 2022-05-30: 2 g via INTRAVENOUS
  Filled 2022-05-30: qty 100

## 2022-05-30 MED ORDER — CEFAZOLIN SODIUM-DEXTROSE 2-4 GM/100ML-% IV SOLN: 2.0000 g | Freq: Once | INTRAVENOUS | Status: AC

## 2022-05-30 MED ORDER — HEPARIN SODIUM (PORCINE) 1000 UNIT/ML IJ SOLN
INTRAMUSCULAR | Status: AC
  Administered 2022-05-30: 4.2 mL
  Filled 2022-05-30: qty 10

## 2022-05-30 MED ORDER — CEFAZOLIN SODIUM-DEXTROSE 1-4 GM/50ML-% IV SOLN
1.0000 g | Freq: Once | INTRAVENOUS | Status: DC
  Administered 2022-05-30: 1 g via INTRAVENOUS
  Filled 2022-05-30 (×2): qty 50

## 2022-05-30 MED ORDER — CEFAZOLIN IN SODIUM CHLORIDE 3-0.9 GM/100ML-% IV SOLN
3.0000 g | Freq: Once | INTRAVENOUS | Status: DC
  Filled 2022-05-30: qty 100

## 2022-05-30 NOTE — H&P (Signed)
Chief Complaint: Pin hole in existing tunneled HD catheter. Request is for tunneled HD exchange  Referring Physician(s): Peeples,Samuel J  Supervising Physician: Corrie Mckusick  Patient Status: [facility] - Out-pt  History of Present Illness: Paul [name] is a 32 y.o. male known to IR. History of ESRD with LUE graft that has thrombosis despite IR attempted interventions. Now receiving dialysis from [facility] tunneled catheter. Per note from Dr. Dwaine Gale dated 11.29.23  Tunneled left external jugular vein hemodialysis catheter is ready for use (28 cm). 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 Right external jugular vein is patent, however does not communicate with central venous structures in a pattern which would allow for catheter placement. Patient states that the dialysis Team found leaking out of pin sized hole in the catheter during last dialysis session. Team is requesting a tunneled HD catheter exchange.  Patient alert standing in IR suite. Denies any fevers, headache, chest pain, SOB, cough, abdominal pain, nausea, vomiting or bleeding. Return precautions and treatment recommendations and follow-up discussed with the patient  who is agreeable with the plan.    Past Medical History:  Diagnosis Date   Acute hypoxemic respiratory failure ([place]) 09/11/2016   Anemia    Chronic kidney disease    ARF on CRF Dialysis T/TH/Sa   Diabetes mellitus    Type II   End stage renal disease on dialysis ([place])    [place]    GERD (gastroesophageal reflux disease)    diet controlled   HLD (hyperlipidemia)    Hypertension    Hypothyroidism    Morbid obesity ([place])    Sacral wound    resolved   Thyroid disease     Past Surgical History:  Procedure Laterality Date   AMPUTATION Left 05/08/2021   Procedure: AMPUTATION GREAT TOE;  Surgeon: Newt Minion, MD;  Location: [place];  Service: Orthopedics;  Laterality: Left;   AV FISTULA PLACEMENT Left 09/26/2016   Procedure: LEFT UPPER ARM  ARTERIOVENOUS (AV) FISTULA CREATION;  Surgeon: Elam Dutch, MD;  Location: [place];  Service: Vascular;  Laterality: Left;   AV FISTULA PLACEMENT Left 12/02/2016   Procedure: INSERTION OF ARTERIOVENOUS GORE-TEX GRAFT LEFT UPPER  ARM USING A 4-7MM BY 45CM GRAFT ;  Surgeon: Rosetta Posner, MD;  Location: [place];  Service: Vascular;  Laterality: Left;   AV FISTULA PLACEMENT Right 07/03/2020   Procedure: INSERTION OF ARTERIOVENOUS (AV) GORE-TEX GRAFT RIGHT UPPER ARM;  Surgeon: Marty Heck, MD;  Location: [place];  Service: Vascular;  Laterality: Right;   AV FISTULA PLACEMENT Left 07/02/2021   Procedure: LEFT UPPER EXTREMITY ARTERIOVENOUS FISTULA CREATION;  Surgeon: Waynetta Sandy, MD;  Location: [place];  Service: Vascular;  Laterality: Left;   [place] Right 05/06/2020   Procedure: FIRST STAGE RIGHT [place];  Surgeon: Serafina Mitchell, MD;  Location: [place];  Service: Vascular;  Laterality: Right;   [place] Left 08/20/2021   Procedure: LEFT ARM SECOND STAGE WITH ARTERIOVENOUS GRAFT;  Surgeon: Waynetta Sandy, MD;  Location: [place];  Service: Vascular;  Laterality: Left;   EXCHANGE OF A DIALYSIS CATHETER Right 10/08/2016   Procedure: EXCHANGE OF A DIALYSIS CATHETER-RIGHT INTERNAL JUGULAR;  Surgeon: Conrad [place] Lake, MD;  Location: [place];  Service: Vascular;  Laterality: Right;   INSERTION OF DIALYSIS CATHETER Right 09/26/2016   Procedure: INSERTION OF DIALYSIS CATHETER - Right Internal Jugular Placement;  Surgeon: Elam Dutch, MD;  Location: [place];  Service: Vascular;  Laterality:  Right;   INSERTION OF DIALYSIS CATHETER Left 03/22/2018   Procedure: INSERTION OF DIALYSIS CATHETER;  Surgeon: Marty Heck, MD;  Location: [place];  Service: Vascular;  Laterality: Left;   INTRAOPERATIVE ARTERIOGRAM Left 05/04/2022   Procedure: INTRA OPERATIVE ARTERIOGRAM;  Surgeon: Broadus John, MD;  Location: [place];  Service:  Vascular;  Laterality: Left;   IR DIALY SHUNT INTRO NEEDLE/INTRACATH INITIAL W/IMG LEFT Left 04/15/2022   IR FLUORO GUIDE CV LINE LEFT  04/19/2022   IR FLUORO GUIDE CV LINE RIGHT  03/26/2021   IR FLUORO GUIDE CV LINE RIGHT  05/13/2021   IR FLUORO GUIDE CV LINE RIGHT  05/04/2022   IR REMOVAL TUN CV CATH W/O FL  05/11/2021   IR REMOVAL TUN CV CATH W/O FL  10/06/2021   IR THROMBECTOMY AV FISTULA W/THROMBOLYSIS/PTA INC/SHUNT/IMG LEFT Left 02/02/2022   IR THROMBECTOMY AV FISTULA W/THROMBOLYSIS/PTA INC/SHUNT/IMG LEFT Left 03/22/2022   IR THROMBECTOMY AV FISTULA W/THROMBOLYSIS/PTA INC/SHUNT/IMG LEFT Left 05/04/2022   IR THROMBECTOMY AV FISTULA W/THROMBOLYSIS/PTA/STENT INC/SHUNT/IMG LT Left 04/27/2022   IR US GUIDE VASC ACCESS LEFT  02/02/2022   IR US GUIDE VASC ACCESS LEFT  03/22/2022   IR US GUIDE VASC ACCESS LEFT  04/15/2022   IR US GUIDE VASC ACCESS LEFT  04/19/2022   IR US GUIDE VASC ACCESS LEFT  04/27/2022   IR US GUIDE VASC ACCESS RIGHT  03/26/2021   IR US GUIDE VASC ACCESS RIGHT  05/13/2021   IR US GUIDE VASC ACCESS RIGHT  05/04/2022   REVISON OF ARTERIOVENOUS FISTULA Left 03/22/2018   Procedure: LEFT UPPER EXTREMITY ARTERIOVENOUS  REVISION WITH GORE-TEX GRAFT.;  Surgeon: Marty Heck, MD;  Location: [place];  Service: Vascular;  Laterality: Left;   TEE WITHOUT CARDIOVERSION N/A 05/12/2021   Procedure: TRANSESOPHAGEAL ECHOCARDIOGRAM (TEE);  Surgeon: Werner Lean, MD;  Location: [facility] ENDOS[facility];  Service: Cardiovascular;  Laterality: N/A;   THROMBECTOMY Left 08/26/2019   thrombectomy of LUA loop AVG   THROMBECTOMY AND REVISION OF ARTERIOVENTOUS (AV) GORETEX  GRAFT Right 07/27/2020   Procedure: INSERTION OF RIGHT ARM LOOP GRAFT WITH EXCISION OF RIGHT ARM BRACHIAL AXILLARY GRAFT;  Surgeon: Marty Heck, MD;  Location: [place];  Service: Vascular;  Laterality: Right;   THROMBECTOMY BRACHIAL ARTERY Left 05/04/2022   Procedure: LEFT ARM RADIAL BRACHIAL ARTERY EMBOLECTOMY;  Surgeon:  Broadus John, MD;  Location: [place];  Service: Vascular;  Laterality: Left;   UPPER EXTREMITY VENOGRAPHY Bilateral 06/14/2021   Procedure: UPPER EXTREMITY VENOGRAPHY;  Surgeon: Waynetta Sandy, MD;  Location: [place] [facility];  Service: Cardiovascular;  Laterality: Bilateral;   VISCERAL ANGIOGRAPHY Right 06/17/2020   Procedure: CENTRAL VENO;  Surgeon: Cherre Robins, MD;  Location: [place] [facility];  Service: Cardiovascular;  Laterality: Right;    Allergies: Hydralazine hcl  Medications: Prior to Admission medications   Medication Sig Start Date End Date Taking? Authorizing Provider  Accu-Chek Softclix Lancets lancets Use as instructed 06/11/21   Eppie Gibson, MD  acetaminophen (TYLENOL) 500 MG tablet Take 1,000 mg by mouth every 6 (six) hours as needed (for pain.).    [provider]  apixaban (ELIQUIS) 5 MG TABS tablet Take 1 tablet (5 mg total) by mouth 2 (two) times daily. 05/11/22   Dameron, Luna Fuse, DO  aspirin EC 81 MG tablet Take 1 tablet (81 mg total) by mouth daily. Swallow whole. 05/11/22   Dameron, Luna Fuse, DO  Blood Glucose Monitoring Suppl (ACCU-CHEK GUIDE ME) w/Device KIT 1 kit by Does  not apply route daily. 05/22/21   Eppie Gibson, MD  calcium carbonate (TUMS - DOSED IN MG ELEMENTAL CALCIUM) 500 MG chewable tablet Chew 1,000 mg by mouth daily as needed for indigestion or heartburn.    [provider]  cetirizine (ZYRTEC) 10 MG tablet Take 1 tablet (10 mg total) by mouth daily as needed for allergies. 05/11/22   Dameron, Luna Fuse, DO  cinacalcet (SENSIPAR) 30 MG tablet Take 30 mg by mouth daily. 08/12/21   [provider]  ferric citrate (AURYXIA) 1 GM 210 MG(Fe) tablet Take 2-4 tablets (420-840 mg total) by mouth See admin instructions. Take 4 tablets (840 mg) by mouth with each meal & take 2 tablets (420 mg) by mouth with each snack. 05/11/22   Dameron, Luna Fuse, DO  gabapentin (NEURONTIN) 100 MG capsule TAKE 1 CAPSULE BY MOUTH THREE  TIMES DAILY 05/09/22   Waynetta Sandy, MD  gabapentin (NEURONTIN) 300 MG capsule Take 1 capsule (300 mg total) by mouth at bedtime. 05/11/22   Dameron, Luna Fuse, DO  glucose blood (ACCU-CHEK GUIDE) test strip 1 each by Other route 2 (two) times daily. Use as instructed 09/22/21   Wells Guiles, DO  LANTUS 100 UNIT/ML injection Inject 0.05 mLs (5 Units total) into the skin 2 (two) times daily. 05/11/22   Dameron, Luna Fuse, DO  levothyroxine (SYNTHROID) 150 MCG tablet Take 1 tablet (150 mcg total) by mouth daily before breakfast. 05/11/22   Dameron, Luna Fuse, DO  midodrine (PROAMATINE) 5 MG tablet Take 1 tablet (5 mg total) by mouth Every Tuesday,Thursday,and Saturday with dialysis. 05/12/22   Dameron, Luna Fuse, DO  multivitamin (RENA-VIT) TABS tablet Take 1 tablet by mouth at bedtime. 05/11/22   Dameron, Luna Fuse, DO  oxyCODONE-acetaminophen (PERCOCET) 5-325 MG tablet Take 1 tablet by mouth every 6 (six) hours as needed for severe pain. 05/09/22 05/09/23  Dagoberto Ligas, PA-C  rosuvastatin (CRESTOR) 20 MG tablet Take 1 tablet (20 mg total) by mouth daily. 05/11/22   Dameron, Luna Fuse, DO  Syringe, Disposable, (B-D SYRINGE SLIP TIP 30CC) 30 ML MISC 1 Syringe by Does not apply route daily. 01/31/19   Wilber Oliphant, MD     Family History  Problem Relation Age of Onset   Heart disease Mother    Hypertension Mother     Social History   Socioeconomic History   Marital status: Single    Spouse name: Not on file   Number of children: Not on file   Years of education: Not on file   Highest education level: Not on file  Occupational History   Not on file  Tobacco Use   Smoking status: Never   Smokeless tobacco: Never  Vaping Use   Vaping Use: Never used  Substance and Sexual Activity   Alcohol use: No   Drug use: Yes    Frequency: 2.0 times per week    Types: Marijuana    Comment: Last use was on 08/17/21   Sexual activity: Yes  Other Topics Concern   Not on file  Social History  Narrative   Not on file   Social Determinants of Health   Financial Resource Strain: Not on file  Food Insecurity: No Food Insecurity (05/04/2022)   Hunger Vital Sign    Worried About Running Out of Food in the Last Year: Never true    Ran Out of Food in the Last Year: Never true  Transportation Needs: No Transportation Needs (05/04/2022)   PRAPARE - Hydrologist (Medical): No  Lack of Transportation (Non-Medical): No  Physical Activity: Insufficiently Active (07/07/2021)   Exercise Vital Sign    Days of Exercise per Week: 4 days    Minutes of Exercise per Session: 30 min  Stress: Stress Concern Present (08/06/2021)   [place]    Feeling of Stress : To some extent  Social Connections: Socially Isolated (06/07/2021)   Social Connection and Isolation Panel [NHANES]    Frequency of Communication with Friends and Family: More than three times a week    Frequency of Social Gatherings with Friends and Family: More than three times a week    Attends Religious Services: Never    Marine scientist or Organizations: No    Attends Archivist Meetings: Never    Marital Status: Never married    Review of Systems: A 12 point ROS discussed and pertinent positives are indicated in the HPI above.  All other systems are negative.  Review of Systems  Constitutional:  Negative for fever.  HENT:  Negative for congestion.   Respiratory:  Negative for cough and shortness of breath.   Cardiovascular:  Negative for chest pain.  Gastrointestinal:  Negative for abdominal pain.  Neurological:  Negative for headaches.  Psychiatric/Behavioral:  Negative for behavioral problems and confusion.     Vital Signs: There were no vitals taken for this visit.    Physical Exam Vitals and nursing note reviewed.  Constitutional:      Appearance: He is well-developed.  HENT:     Head:  Normocephalic.  Cardiovascular:     Rate and Rhythm: Normal rate and regular rhythm.  Pulmonary:     Effort: Pulmonary effort is normal.  Musculoskeletal:        General: Normal range of motion.     Cervical back: Normal range of motion.  Skin:    General: Skin is dry.  Neurological:     Mental Status: He is alert and oriented to person, place, and time.     Imaging: VAS [place] ABI WITH/WO TBI  Result Date: 05/27/2022  LOWER EXTREMITY DOPPLER STUDY Patient Name:  Paul [name]  Date of Exam:   05/27/2022 Medical Rec #: [id number]      Accession #:    [id number] Date of Birth: Jul 23, 1990       Patient Gender: M Patient Age:   43 years Exam Location:  [facility] Procedure:      VAS [place] ABI WITH/WO TBI Referring Phys: Aldona Bar RHYNE --------------------------------------------------------------------------------  Indications: May require thigh AVG  Vascular Interventions: 08/20/21: Left second stage BVT.                         05/04/22: Left arm brachial artery, ulnar artery, and                         radial artery embolectomy. Performing Technologist: Ralene Cork RVT  Examination Guidelines: A complete evaluation includes at minimum, Doppler waveform signals and systolic blood pressure reading at the level of bilateral brachial, anterior tibial, and posterior tibial arteries, when vessel segments are accessible. Bilateral testing is considered an integral part of a complete examination. Photoelectric Plethysmograph (PPG) waveforms and toe systolic pressure readings are included as required and additional duplex testing as needed. Limited examinations for reoccurring indications may be performed as noted.  ABI Findings: +---------+------------------+-----+---------+--------+ Right    Rt Pressure (mmHg)IndexWaveform Comment  +---------+------------------+-----+---------+--------+  Brachial 108                                       +---------+------------------+-----+---------+--------+ PTA      248               2.30 triphasic         +---------+------------------+-----+---------+--------+ DP       255               2.36 triphasic         +---------+------------------+-----+---------+--------+ Great Toe92                0.85                   +---------+------------------+-----+---------+--------+ +---------+------------------+-----+----------+-------+ Left     Lt Pressure (mmHg)IndexWaveform  Comment +---------+------------------+-----+----------+-------+ PTA      162               1.50 triphasic         +---------+------------------+-----+----------+-------+ DP       126               1.17 triphasic         +---------+------------------+-----+----------+-------+ Great Toe                       amputation        +---------+------------------+-----+----------+-------+ +-------+-----------+-----------+------------+------------+ ABI/TBIToday's ABIToday's TBIPrevious ABIPrevious TBI +-------+-----------+-----------+------------+------------+ Right  Gypsy         0.85       1.28        0.7          +-------+-----------+-----------+------------+------------+ Left   Mamou         amp        1.26        wound        +-------+-----------+-----------+------------+------------+  Previous ABI on 05/07/21.  Summary: Right: Resting right ankle-brachial index indicates noncompressible right lower extremity arteries. The right toe-brachial index is normal. Left: Resting left ankle-brachial index indicates noncompressible left lower extremity arteries. *See table(s) above for measurements and observations.  Electronically signed by Orlie Pollen on 05/27/2022 at 7:55:47 PM.    Final    VAS [place] UPPER EXTREMITY ARTERIAL DUPLEX  Result Date: 05/27/2022  UPPER EXTREMITY DUPLEX STUDY Patient Name:  Paul [name]  Date of Exam:   05/27/2022 Medical Rec #: [id number]      Accession #:    [id number] Date of Birth:  1990/12/28       Patient Gender: M Patient Age:   66 years Exam Location:  [facility] Procedure:      VAS [place] UPPER EXTREMITY ARTERIAL DUPLEX Referring Phys: Aldona Bar RHYNE --------------------------------------------------------------------------------  Indications: 08/20/21: Left second stage BVT.  Performing Technologist: Ralene Cork RVT  Examination Guidelines: A complete evaluation includes B-mode imaging, spectral Doppler, color Doppler, and power Doppler as needed of all accessible portions of each vessel. Bilateral testing is considered an integral part of a complete examination. Limited examinations for reoccurring indications may be performed as noted.  Left Doppler Findings: +---------------+----------+-------------------+--------+--------+ Site           PSV (cm/s)Waveform           StenosisComments +---------------+----------+-------------------+--------+--------+ Subclavian [id number]       consistent with AVF                 +---------------+----------+-------------------+--------+--------+ Axillary  204       consistent with AVF                 +---------------+----------+-------------------+--------+--------+ Brachial Prox  189       consistent with AVF                 +---------------+----------+-------------------+--------+--------+ Brachial Mid   216       consistent with AVF                 +---------------+----------+-------------------+--------+--------+ Brachial Dist  161       consistent with AVF                 +---------------+----------+-------------------+--------+--------+ Radial Prox    64        triphasic                           +---------------+----------+-------------------+--------+--------+ Radial Mid     47        triphasic                           +---------------+----------+-------------------+--------+--------+ Radial Dist    62        triphasic                            +---------------+----------+-------------------+--------+--------+ Ulnar Prox     33        triphasic                           +---------------+----------+-------------------+--------+--------+ Ulnar Mid      44        triphasic                           +---------------+----------+-------------------+--------+--------+ Ulnar Dist     37        triphasic                           +---------------+----------+-------------------+--------+--------+   Summary:  Left: Patent left arm arterial system with no stenosis seen.       Patent axillary vein stent with no stenosis seen. *See table(s) above for measurements and observations. Electronically signed by Orlie Pollen on 05/27/2022 at 7:55:28 PM.    Final    IR US Guide Vasc Access Right  Result Date: 05/04/2022 INDICATION: 32 year old gentleman with end-stage renal disease returns to IR for attempted declot of thrombosed left brachial basilic graft. The patient has had numerous prior dialysis fistula graft which have thrombosed and are no longer usable. He has bilateral occluded internal and right external jugular veins. He is currently being dialyzed through tunneled left external jugular HD catheter. Most recently declot of the graft was attempted on 04/27/2022. 8 mm stent was placed at the venous anastomosis. The arterial anastomosis was plasty up to 5 mm. The entire length of the graft was plastied with a 7 mm balloon. EXAM: 1. Ultrasound-guided access of right common femoral vein 2. Attempted declot of left upper extremity graft with mechanical and suction thrombectomy. 3. Right common femoral vein non tunneled central line placement. MEDICATIONS: TPA 6 g intra arterial 6000 units of IV heparin ANESTHESIA/SEDATION: Moderate (conscious) sedation was employed during this procedure. A total of Versed 4 mg and Fentanyl 200 mcg was administered intravenously by the radiology nurse.  Total intra-service moderate Sedation Time: 120 minutes. The  patient's level of consciousness and vital signs were monitored continuously by radiology nursing throughout the procedure under my direct supervision. FLUOROSCOPY: Radiation Exposure Index (as provided by the fluoroscopic device): 75 mGy Kerma COMPLICATIONS: SIR LEVEL C - Requires therapy, minor hospitalization (<48 hrs). PROCEDURE: Informed written consent was obtained from the patient after a thorough discussion of the procedural risks, benefits and alternatives. All questions were addressed. Maximal Sterile Barrier Technique was utilized including caps, mask, sterile gowns, sterile gloves, sterile drape, hand hygiene and skin antiseptic. A timeout was performed prior to the initiation of the procedure. Ultrasound image documenting patency of the right common femoral vein was obtained and placed in permanent medical record. Sterile ultrasound probe cover and gel utilized throughout the procedure. Utilizing continuous ultrasound guidance, the right common femoral vein was accessed at the level of the femoral head with a 21 gauge needle. 21 gauge needle exchanged for a transitional dilator set over 0.018 inch guidewire. Transitional dilator set exchanged for 5 French sheath over 0.035 inch guidewire. Bern catheter was advanced through the 5 [place] sheath the level of the left axillary vein and venogram was performed documenting no significant thrombus. Bern catheter and 5 French sheath were removed over 0.035 inch Amplatz guidewire. 90 cm 7 French sheath was inserted and positioned at the level of the axillary vein. Bern catheter and Glidewire were navigated across the venous anastomosis utilizing fluoroscopic guidance. Suction thrombectomy was performed within the distal graft removing some thrombus. However flow did not return to the graft. Navicross catheter and 0.035 Glidewire were utilized to cross the arterial anastomosis. The length of the graft in the venous anastomosis were plastied with 7 mm balloon for  clot maceration. 5 mm balloon was advanced across the anastomosis and arterial plug was pulled twice. Navicross catheter was advanced across the anastomosis and fistulagram was performed. No significant flow was seen through the graft. Additional suction thrombectomy was performed with CAT 7 penumbra device which further reduced in overall thrombus burden. Flow temporary returned within the graft. Contrast administered through the sheath showed significant residual thrombus. Some of the thrombus extended across the anastomosis. The Navicross catheter was again advanced into the brachial artery and arteriogram was performed which showed thrombus within the brachial artery at the level of the anastomosis with little to no distal flow. 6 mg of tPA was administered into the arterial thrombus, however flow remained sluggish with thrombus still remaining within the artery. Decision was made to terminate the procedure. The catheter was removed. The right groin sheath was removed over 0.035 inch guidewire and replaced with triple-lumen central venous catheter. These underlying was secured to skin with silk suture and the insertion site was covered with bio patch and sterile dressing. IMPRESSION: 1. Failed declot of left brachial basilic graft. 2. Procedure complicated by migration of thrombus into left brachial artery resulting in occlusion. Vascular surgery consulted for thrombectomy. ACCESS: This access is not amenable to future percutaneous interventions as clinically indicated. Electronically Signed   By: Miachel Roux M.D.   On: 05/04/2022 17:12   IR Fluoro Guide CV Line Right  Result Date: 05/04/2022 INDICATION: 32 year old gentleman with end-stage renal disease returns to IR for attempted declot of thrombosed left brachial basilic graft. The patient has had numerous prior dialysis fistula graft which have thrombosed and are no longer usable. He has bilateral occluded internal and right external jugular veins. He  is currently being dialyzed through tunneled left external jugular  HD catheter. Most recently declot of the graft was attempted on 04/27/2022. 8 mm stent was placed at the venous anastomosis. The arterial anastomosis was plasty up to 5 mm. The entire length of the graft was plastied with a 7 mm balloon. EXAM: 1. Ultrasound-guided access of right common femoral vein 2. Attempted declot of left upper extremity graft with mechanical and suction thrombectomy. 3. Right common femoral vein non tunneled central line placement. MEDICATIONS: TPA 6 g intra arterial 6000 units of IV heparin ANESTHESIA/SEDATION: Moderate (conscious) sedation was employed during this procedure. A total of Versed 4 mg and Fentanyl 200 mcg was administered intravenously by the radiology nurse. Total intra-service moderate Sedation Time: 120 minutes. The patient's level of consciousness and vital signs were monitored continuously by radiology nursing throughout the procedure under my direct supervision. FLUOROSCOPY: Radiation Exposure Index (as provided by the fluoroscopic device): 75 mGy Kerma COMPLICATIONS: SIR LEVEL C - Requires therapy, minor hospitalization (<48 hrs). PROCEDURE: Informed written consent was obtained from the patient after a thorough discussion of the procedural risks, benefits and alternatives. All questions were addressed. Maximal Sterile Barrier Technique was utilized including caps, mask, sterile gowns, sterile gloves, sterile drape, hand hygiene and skin antiseptic. A timeout was performed prior to the initiation of the procedure. Ultrasound image documenting patency of the right common femoral vein was obtained and placed in permanent medical record. Sterile ultrasound probe cover and gel utilized throughout the procedure. Utilizing continuous ultrasound guidance, the right common femoral vein was accessed at the level of the femoral head with a 21 gauge needle. 21 gauge needle exchanged for a transitional dilator set  over 0.018 inch guidewire. Transitional dilator set exchanged for 5 French sheath over 0.035 inch guidewire. Bern catheter was advanced through the 5 [place] sheath the level of the left axillary vein and venogram was performed documenting no significant thrombus. Bern catheter and 5 French sheath were removed over 0.035 inch Amplatz guidewire. 90 cm 7 French sheath was inserted and positioned at the level of the axillary vein. Bern catheter and Glidewire were navigated across the venous anastomosis utilizing fluoroscopic guidance. Suction thrombectomy was performed within the distal graft removing some thrombus. However flow did not return to the graft. Navicross catheter and 0.035 Glidewire were utilized to cross the arterial anastomosis. The length of the graft in the venous anastomosis were plastied with 7 mm balloon for clot maceration. 5 mm balloon was advanced across the anastomosis and arterial plug was pulled twice. Navicross catheter was advanced across the anastomosis and fistulagram was performed. No significant flow was seen through the graft. Additional suction thrombectomy was performed with CAT 7 penumbra device which further reduced in overall thrombus burden. Flow temporary returned within the graft. Contrast administered through the sheath showed significant residual thrombus. Some of the thrombus extended across the anastomosis. The Navicross catheter was again advanced into the brachial artery and arteriogram was performed which showed thrombus within the brachial artery at the level of the anastomosis with little to no distal flow. 6 mg of tPA was administered into the arterial thrombus, however flow remained sluggish with thrombus still remaining within the artery. Decision was made to terminate the procedure. The catheter was removed. The right groin sheath was removed over 0.035 inch guidewire and replaced with triple-lumen central venous catheter. These underlying was secured to skin with  silk suture and the insertion site was covered with bio patch and sterile dressing. IMPRESSION: 1. Failed declot of left brachial basilic graft. 2. Procedure complicated  by migration of thrombus into left brachial artery resulting in occlusion. Vascular surgery consulted for thrombectomy. ACCESS: This access is not amenable to future percutaneous interventions as clinically indicated. Electronically Signed   By: Miachel Roux M.D.   On: 05/04/2022 17:12   IR THROMBECTOMY AV FISTULA W/THROMBOLYSIS/PTA INC/SHUNT/IMG LEFT  Result Date: 05/04/2022 INDICATION: 32 year old gentleman with end-stage renal disease returns to IR for attempted declot of thrombosed left brachial basilic graft. The patient has had numerous prior dialysis fistula graft which have thrombosed and are no longer usable. He has bilateral occluded internal and right external jugular veins. He is currently being dialyzed through tunneled left external jugular HD catheter. Most recently declot of the graft was attempted on 04/27/2022. 8 mm stent was placed at the venous anastomosis. The arterial anastomosis was plasty up to 5 mm. The entire length of the graft was plastied with a 7 mm balloon. EXAM: 1. Ultrasound-guided access of right common femoral vein 2. Attempted declot of left upper extremity graft with mechanical and suction thrombectomy. 3. Right common femoral vein non tunneled central line placement. MEDICATIONS: TPA 6 g intra arterial 6000 units of IV heparin ANESTHESIA/SEDATION: Moderate (conscious) sedation was employed during this procedure. A total of Versed 4 mg and Fentanyl 200 mcg was administered intravenously by the radiology nurse. Total intra-service moderate Sedation Time: 120 minutes. The patient's level of consciousness and vital signs were monitored continuously by radiology nursing throughout the procedure under my direct supervision. FLUOROSCOPY: Radiation Exposure Index (as provided by the fluoroscopic device): 75 mGy  Kerma COMPLICATIONS: SIR LEVEL C - Requires therapy, minor hospitalization (<48 hrs). PROCEDURE: Informed written consent was obtained from the patient after a thorough discussion of the procedural risks, benefits and alternatives. All questions were addressed. Maximal Sterile Barrier Technique was utilized including caps, mask, sterile gowns, sterile gloves, sterile drape, hand hygiene and skin antiseptic. A timeout was performed prior to the initiation of the procedure. Ultrasound image documenting patency of the right common femoral vein was obtained and placed in permanent medical record. Sterile ultrasound probe cover and gel utilized throughout the procedure. Utilizing continuous ultrasound guidance, the right common femoral vein was accessed at the level of the femoral head with a 21 gauge needle. 21 gauge needle exchanged for a transitional dilator set over 0.018 inch guidewire. Transitional dilator set exchanged for 5 French sheath over 0.035 inch guidewire. Bern catheter was advanced through the 5 [place] sheath the level of the left axillary vein and venogram was performed documenting no significant thrombus. Bern catheter and 5 French sheath were removed over 0.035 inch Amplatz guidewire. 90 cm 7 French sheath was inserted and positioned at the level of the axillary vein. Bern catheter and Glidewire were navigated across the venous anastomosis utilizing fluoroscopic guidance. Suction thrombectomy was performed within the distal graft removing some thrombus. However flow did not return to the graft. Navicross catheter and 0.035 Glidewire were utilized to cross the arterial anastomosis. The length of the graft in the venous anastomosis were plastied with 7 mm balloon for clot maceration. 5 mm balloon was advanced across the anastomosis and arterial plug was pulled twice. Navicross catheter was advanced across the anastomosis and fistulagram was performed. No significant flow was seen through the graft.  Additional suction thrombectomy was performed with CAT 7 penumbra device which further reduced in overall thrombus burden. Flow temporary returned within the graft. Contrast administered through the sheath showed significant residual thrombus. Some of the thrombus extended across the anastomosis. The Navicross catheter  was again advanced into the brachial artery and arteriogram was performed which showed thrombus within the brachial artery at the level of the anastomosis with little to no distal flow. 6 mg of tPA was administered into the arterial thrombus, however flow remained sluggish with thrombus still remaining within the artery. Decision was made to terminate the procedure. The catheter was removed. The right groin sheath was removed over 0.035 inch guidewire and replaced with triple-lumen central venous catheter. These underlying was secured to skin with silk suture and the insertion site was covered with bio patch and sterile dressing. IMPRESSION: 1. Failed declot of left brachial basilic graft. 2. Procedure complicated by migration of thrombus into left brachial artery resulting in occlusion. Vascular surgery consulted for thrombectomy. ACCESS: This access is not amenable to future percutaneous interventions as clinically indicated. Electronically Signed   By: Miachel Roux M.D.   On: 05/04/2022 17:12   HYBRID OR IMAGING ([facility] ONLY)  Result Date: 05/04/2022 There is no interpretation for this exam.  This order is for images obtained during a surgical procedure.  Please See "Surgeries" Tab for more information regarding the procedure.    Labs:  CBC: Recent Labs    04/19/22 0901 04/20/22 0608 05/04/22 1450 05/05/22 0508 05/11/22 1106  WBC 7.8 8.4  --  9.2 10.7  HGB 13.2 13.5 14.6 13.0 13.2  HCT 42.0 42.1 43.0 39.1 39.2  PLT 247 251  --  210 305    COAGS: No results for input(s): "INR", "APTT" in the last 8760 hours.  BMP: Recent Labs    04/19/22 0901 04/20/22 0608 04/27/22 1251  05/04/22 1450 05/05/22 0508  NA 140 136 138 140 135  K 6.5* 4.4 4.2 4.4 5.3*  CL 100 94* 98 102 98  CO2 16* 23 24  --  19*  GLUCOSE 102* 128* 107* 102* 164*  BUN 91* 52* 43* 43* 58*  CALCIUM 8.3* 8.4* 8.8*  --  8.5*  CREATININE 27.99* 17.74* 14.16* 14.80* 14.80*  GFRNONAA 2* 3* 4*  --  4*    LIVER FUNCTION TESTS: Recent Labs    04/18/22 1014 04/19/22 0901 04/20/22 0608 05/05/22 0508  BILITOT 0.7 0.7  --   --   AST 12* 15  --   --   ALT 13 13  --   --   ALKPHOS 51 51  --   --   PROT 7.6 7.4  --   --   ALBUMIN 4.1 3.9 4.2 3.7     Assessment and Plan:  32 y.o. male known to IR. History of ESRD with LUE graft that has thrombosis despite IR attempted interventions. Now receiving dialysis from [facility] catheter. Per note from Dr. Dwaine Gale dated 11.29.23  Tunneled left external jugular vein hemodialysis catheter is ready for use (28 cm). 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 Right external jugular vein is patent, however does not communicate with central venous structures in a pattern which would allow for catheter placement. Patient states that the dialysis Team found leaking out of pin sized hole in the catheter during last dialysis session. Team is requesting a tunneled HD catheter exchange.  Risks and benefits discussed with the patient including, but not limited to bleeding, infection, vascular injury, pneumothorax which may require chest tube placement, air embolism or even death  All of the patient's questions were answered, patient is agreeable to proceed. Consent signed and in chart.   Thank you for this interesting consult.  I greatly enjoyed meeting Caedin Mogan and look  forward to participating in their care.  A copy of this report was sent to the requesting provider on this date.  Electronically Signed: Jacqualine Mau, NP 05/30/2022, 1:28 PM   I spent a total of  30 Minutes   in face to face in clinical consultation, greater than 50% of which was  counseling/coordinating care for tunneled HD catheter exchange

## 2022-05-30 NOTE — Procedures (Signed)
Interventional Radiology Procedure Note  Procedure: Replacement of a left IJ approach tunneled HD cath.   Tip is positioned at the superior cavoatrial junction and catheter is ready for immediate use. New 28cm tip to cuff  Findings: Blue hub had a small fracture/leak, confirmed at the clamp site  Complications: None  Recommendations:  - Ok to use - Do not submerge - Routine line care  - OK for DC after ABX  Signed,  Dulcy Fanny. Earleen Newport, DO

## 2022-06-03 ENCOUNTER — Encounter: Payer: Self-pay | Admitting: Orthopedic Surgery

## 2022-06-03 NOTE — Progress Notes (Addendum)
Office Visit Note   Patient: Paul [name]           Date of Birth: 1990-06-21           MRN: [id number] Visit Date: 05/30/2022              Requested by: Orvis Brill, DO [address] [place],  [place] [address] PCP: Orvis Brill, DO  Chief Complaint  Patient presents with   Right Foot - Follow-up    Toenail trimming   Left Foot - Follow-up    Toenail trimming      HPI: Patient is a 32 year old gentleman status post left first ray amputation a year ago.  Patient presents with thickened discolored onychomycotic nails x 9.  Assessment & Plan: Visit Diagnoses:  1. 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 Non-pressure chronic ulcer of other part of left foot limited to breakdown of skin ([place])   3. Non-pressure chronic ulcer of other part of right foot limited to breakdown of skin ([place])   4. Onychomycosis     Plan: Ulcer was debrided on both feet nails were trimmed x 9.  Follow-Up Instructions: Return in about 3 months (around 08/29/2022).   Ortho Exam  Patient is alert, oriented, no adenopathy, well-dressed, normal affect, normal respiratory effort. Examination patient had a superficial Wagner grade 1 ulcer on both the beneath the left first ray and beneath the right great toe.  After informed consent a 10 blade knife was used to debride the skin and soft tissue back to healthy viable tissue on both feet.  The left foot wound is 1 x 2 cm the right foot wound is 2 cm in diameter.  Patient has thickened discolored onychomycotic nails he is unable to trim on his own and the nails were trimmed x 9 without complication.  Imaging: No results found. No images are attached to the encounter.  Labs: Lab Results  Component Value Date   HGBA1C 6.3 (H) 04/18/2022   HGBA1C 6.2 09/22/2021   HGBA1C 6.0 (H) 05/07/2021   REPTSTATUS 05/13/2021 FINAL 05/08/2021   GRAMSTAIN  05/08/2021    FEW WBC PRESENT, PREDOMINANTLY PMN FEW GRAM POSITIVE COCCI    CULT  05/08/2021     MODERATE STAPHYLOCOCCUS AUREUS NO ANAEROBES ISOLATED Performed at [facility], [place] [address], [place], [place] [address]    LABORGA STAPHYLOCOCCUS AUREUS 05/08/2021     Lab Results  Component Value Date   ALBUMIN 3.7 05/05/2022   ALBUMIN 4.2 04/20/2022   ALBUMIN 3.9 04/19/2022    Lab Results  Component Value Date   MG 2.8 (H) 09/26/2016   MG 2.8 (H) 09/25/2016   MG 2.8 (H) 09/24/2016   Lab Results  Component Value Date   VD25OH 6.8 (L) 09/14/2016    No results found for: "PREALBUMIN"    Latest Ref Rng & Units 05/11/2022   11:06 AM 05/05/2022    5:08 AM 05/04/2022    2:50 PM  CBC EXTENDED  WBC 3.4 - 10.8 x10E3/uL 10.7  9.2    RBC 4.14 - 5.80 x10E6/uL 4.93  4.81    Hemoglobin 13.0 - 17.7 g/dL 13.2  13.0  14.6   HCT 37.5 - 51.0 % 39.2  39.1  43.0   Platelets 150 - 450 x10E3/uL 305  210       There is no height or weight on file to calculate BMI.  Orders:  No orders of the defined types were placed in this encounter.  No orders of the defined types were placed in this encounter.    Procedures: No procedures performed  Clinical Data: No additional findings.  ROS:  All other systems negative, except as noted in the HPI. Review of Systems  Objective: Vital Signs: There were no vitals taken for this visit.  Specialty Comments:  No specialty comments available.  PMFS History: Patient Active Problem List   Diagnosis Date Noted   Occlusion of left brachial artery ([place]) 05/04/2022   Thrombus 05/04/2022   Ischemia of left upper extremity 36/62/9476   Complication of vascular access for dialysis 04/19/2022   ESRD (end stage renal disease) ([place]) 04/19/2022   Failure of hemodialysis access ([place]) 04/18/2022   Left hand pain 09/22/2021   Diabetic foot infection ([place])    Wound infection 05/07/2021   MSSA bacteremia 05/07/2021   Osteomyelitis ([place]) 05/07/2021   Abscess 05/07/2021   Sepsis due to cellulitis ([place]) 05/06/2021   Poor compliance  with medication 03/17/2021   Fluid overload, unspecified 54/65/0354   Complication of vascular dialysis catheter 03/23/2020   Other disorders of phosphorus metabolism 01/28/2020   Hypertension 01/08/2020   Hypocalcemia 10/29/2019   ESRD on dialysis ([place]) 11/16/2017   Pruritus, unspecified 04/04/2017   Unspecified protein-calorie malnutrition ([place]) 11/11/2016   Headache, unspecified 10/29/2016   Encounter for immunization 10/19/2016   Iron deficiency anemia, unspecified 10/10/2016   Anemia in chronic kidney disease 09/30/2016   Coagulation defect, unspecified ([place]) 09/30/2016   Pneumonia due to Pseudomonas ([place]) 09/30/2016   Secondary hyperparathyroidism of renal origin ([place]) 09/30/2016   Hypothyroidism 10/29/2009   Type 2 diabetes mellitus ([place]) 04/14/2009   Hyperlipidemia 04/14/2009   OBESITY, MORBID 04/14/2009   Essential hypertension, benign 04/14/2009   Past Medical History:  Diagnosis Date   Acute hypoxemic respiratory failure ([place]) 09/11/2016   Anemia    Chronic kidney disease    ARF on CRF Dialysis T/TH/Sa   Diabetes mellitus    Type II   End stage renal disease on dialysis ([place])    [place]    GERD (gastroesophageal reflux disease)    diet controlled   HLD (hyperlipidemia)    Hypertension    Hypothyroidism    Morbid obesity ([place])    Sacral wound    resolved   Thyroid disease     Family History  Problem Relation Age of Onset   Heart disease Mother    Hypertension Mother     Past Surgical History:  Procedure Laterality Date   AMPUTATION Left 05/08/2021   Procedure: AMPUTATION GREAT TOE;  Surgeon: Newt Minion, MD;  Location: [facility];  Service: Orthopedics;  Laterality: Left;   AV FISTULA PLACEMENT Left 09/26/2016   Procedure: LEFT UPPER ARM ARTERIOVENOUS (AV) FISTULA CREATION;  Surgeon: Elam Dutch, MD;  Location: [facility];  Service: Vascular;  Laterality: Left;   AV FISTULA PLACEMENT Left 12/02/2016   Procedure: INSERTION OF ARTERIOVENOUS GORE-TEX  GRAFT LEFT UPPER  ARM USING A 4-7MM BY 45CM GRAFT ;  Surgeon: Rosetta Posner, MD;  Location: [facility];  Service: Vascular;  Laterality: Left;   AV FISTULA PLACEMENT Right 07/03/2020   Procedure: INSERTION OF ARTERIOVENOUS (AV) GORE-TEX GRAFT RIGHT UPPER ARM;  Surgeon: Marty Heck, MD;  Location: [facility];  Service: Vascular;  Laterality: Right;   AV FISTULA PLACEMENT Left 07/02/2021   Procedure: LEFT UPPER EXTREMITY ARTERIOVENOUS FISTULA CREATION;  Surgeon: Waynetta Sandy, MD;  Location: [facility];  Service: Vascular;  Laterality: Left;   [place]  Right 05/06/2020   Procedure: FIRST STAGE RIGHT BASCILIC VEIN TRANSPOSITION;  Surgeon: Serafina Mitchell, MD;  Location: [facility];  Service: Vascular;  Laterality: Right;   [place] Left 08/20/2021   Procedure: LEFT ARM SECOND STAGE WITH ARTERIOVENOUS GRAFT;  Surgeon: Waynetta Sandy, MD;  Location: [facility];  Service: Vascular;  Laterality: Left;   EXCHANGE OF A DIALYSIS CATHETER Right 10/08/2016   Procedure: EXCHANGE OF A DIALYSIS CATHETER-RIGHT INTERNAL JUGULAR;  Surgeon: Conrad [place], MD;  Location: [facility];  Service: Vascular;  Laterality: Right;   INSERTION OF DIALYSIS CATHETER Right 09/26/2016   Procedure: INSERTION OF DIALYSIS CATHETER - Right Internal Jugular Placement;  Surgeon: Elam Dutch, MD;  Location: [facility];  Service: Vascular;  Laterality: Right;   INSERTION OF DIALYSIS CATHETER Left 03/22/2018   Procedure: INSERTION OF DIALYSIS CATHETER;  Surgeon: Marty Heck, MD;  Location: [facility];  Service: Vascular;  Laterality: Left;   INTRAOPERATIVE ARTERIOGRAM Left 05/04/2022   Procedure: INTRA OPERATIVE ARTERIOGRAM;  Surgeon: Broadus John, MD;  Location: [facility];  Service: Vascular;  Laterality: Left;   IR DIALY SHUNT INTRO NEEDLE/INTRACATH INITIAL W/IMG LEFT Left 04/15/2022   IR FLUORO GUIDE CV LINE LEFT  04/19/2022   IR FLUORO GUIDE CV LINE LEFT  05/30/2022   IR FLUORO GUIDE CV LINE RIGHT   03/26/2021   IR FLUORO GUIDE CV LINE RIGHT  05/13/2021   IR FLUORO GUIDE CV LINE RIGHT  05/04/2022   IR REMOVAL TUN CV CATH W/O FL  05/11/2021   IR REMOVAL TUN CV CATH W/O FL  10/06/2021   IR THROMBECTOMY AV FISTULA W/THROMBOLYSIS/PTA INC/SHUNT/IMG LEFT Left 02/02/2022   IR THROMBECTOMY AV FISTULA W/THROMBOLYSIS/PTA INC/SHUNT/IMG LEFT Left 03/22/2022   IR THROMBECTOMY AV FISTULA W/THROMBOLYSIS/PTA INC/SHUNT/IMG LEFT Left 05/04/2022   IR THROMBECTOMY AV FISTULA W/THROMBOLYSIS/PTA/STENT INC/SHUNT/IMG LT Left 04/27/2022   IR US GUIDE VASC ACCESS LEFT  02/02/2022   IR US GUIDE VASC ACCESS LEFT  03/22/2022   IR US GUIDE VASC ACCESS LEFT  04/15/2022   IR US GUIDE VASC ACCESS LEFT  04/19/2022   IR US GUIDE [place] LEFT  04/27/2022   IR US GUIDE VASC ACCESS RIGHT  03/26/2021   IR US GUIDE VASC ACCESS RIGHT  05/13/2021   IR US GUIDE VASC ACCESS RIGHT  05/04/2022   REVISON OF ARTERIOVENOUS FISTULA Left 03/22/2018   Procedure: LEFT UPPER EXTREMITY ARTERIOVENOUS  REVISION WITH GORE-TEX GRAFT.;  Surgeon: Marty Heck, MD;  Location: [facility];  Service: Vascular;  Laterality: Left;   TEE WITHOUT CARDIOVERSION N/A 05/12/2021   Procedure: TRANSESOPHAGEAL ECHOCARDIOGRAM (TEE);  Surgeon: Werner Lean, MD;  Location[facility];  Service: Cardiovascular;  Laterality: N/A;   THROMBECTOMY Left 08/26/2019   thrombectomy of LUA loop AVG   THROMBECTOMY AND REVISION OF ARTERIOVENTOUS (AV) GORETEX  GRAFT Right 07/27/2020   Procedure: INSERTION OF RIGHT ARM LOOP GRAFT WITH EXCISION OF RIGHT ARM BRACHIAL AXILLARY GRAFT;  Surgeon: Marty Heck, MD;  Location: [facility];  Service: Vascular;  Laterality: Right;   THROMBECTOMY BRACHIAL ARTERY Left 05/04/2022   Procedure: LEFT ARM RADIAL BRACHIAL ARTERY EMBOLECTOMY;  Surgeon: Broadus John, MD;  Location: [facility];  Service: Vascular;  Laterality: Left;   UPPER EXTREMITY VENOGRAPHY Bilateral 06/14/2021   Procedure: UPPER EXTREMITY VENOGRAPHY;  Surgeon:  Waynetta Sandy, MD;  Location: [facility];  Service: Cardiovascular;  Laterality: Bilateral;   VISCERAL ANGIOGRAPHY Right 06/17/2020   Procedure: CENTRAL VENO;  Surgeon: Stanford Breed,  Yevonne Aline, MD;  Location: [facility];  Service: Cardiovascular;  Laterality: Right;   Social History   Occupational History   Not on file  Tobacco Use   Smoking status: Never   Smokeless tobacco: Never  Vaping Use   Vaping Use: Never used  Substance and Sexual Activity   Alcohol use: No   Drug use: Yes    Frequency: 2.0 times per week    Types: Marijuana    Comment: Last use was on 08/17/21   Sexual activity: Yes

## 2022-06-07 ENCOUNTER — Telehealth: Payer: Self-pay

## 2022-06-07 NOTE — Telephone Encounter (Signed)
Patient called to cancel his new patient appointment on 06/18/22. Secure chat to [facility]. To request that appointments be. rescheduled.

## 2022-06-08 ENCOUNTER — Other Ambulatory Visit: Payer: Medicaid Other

## 2022-06-08 ENCOUNTER — Encounter: Payer: Medicaid Other | Admitting: Physician Assistant

## 2022-06-13 ENCOUNTER — Other Ambulatory Visit: Payer: Self-pay | Admitting: Student

## 2022-06-16 ENCOUNTER — Other Ambulatory Visit (HOSPITAL_COMMUNITY): Payer: Self-pay | Admitting: Internal Medicine

## 2022-06-16 DIAGNOSIS — Z992 Dependence on renal dialysis: Secondary | ICD-10-CM

## 2022-06-16 DIAGNOSIS — N186 End stage renal disease: Secondary | ICD-10-CM

## 2022-06-22 DIAGNOSIS — Z992 Dependence on renal dialysis: Secondary | ICD-10-CM | POA: Diagnosis not present

## 2022-06-22 DIAGNOSIS — N186 End stage renal disease: Secondary | ICD-10-CM | POA: Diagnosis not present

## 2022-06-22 DIAGNOSIS — Z452 Encounter for adjustment and management of vascular access device: Secondary | ICD-10-CM | POA: Diagnosis not present

## 2022-06-22 DIAGNOSIS — E1129 Type 2 diabetes mellitus with other diabetic kidney complication: Secondary | ICD-10-CM | POA: Diagnosis not present

## 2022-06-24 ENCOUNTER — Ambulatory Visit (HOSPITAL_COMMUNITY): Payer: Medicaid Other

## 2022-06-24 ENCOUNTER — Encounter (HOSPITAL_COMMUNITY): Payer: Self-pay

## 2022-07-09 ENCOUNTER — Other Ambulatory Visit: Payer: Self-pay | Admitting: Student

## 2022-07-20 ENCOUNTER — Encounter: Payer: Self-pay | Admitting: Family

## 2022-07-20 ENCOUNTER — Ambulatory Visit (INDEPENDENT_AMBULATORY_CARE_PROVIDER_SITE_OTHER): Payer: Medicaid Other | Admitting: Family

## 2022-07-20 DIAGNOSIS — B351 Tinea unguium: Secondary | ICD-10-CM | POA: Diagnosis not present

## 2022-07-20 DIAGNOSIS — L97521 Non-pressure chronic ulcer of other part of left foot limited to breakdown of skin: Secondary | ICD-10-CM | POA: Diagnosis not present

## 2022-07-20 DIAGNOSIS — Z89432 Acquired absence of left foot: Secondary | ICD-10-CM | POA: Diagnosis not present

## 2022-07-20 NOTE — Progress Notes (Signed)
Office Visit Note   Patient: Paul [name]           Date of Birth: [birth year]           MRN: [id number] Visit Date: 07/20/2022              Requested by: Orvis Brill, DO [address] 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  Has had issues with calluses and ulcers beneath the ball of his foot.  Also requesting a nail trim.  Assessment & Plan: Visit Diagnoses: No diagnosis found.  Plan: Ulcer to left foot debrided with 10 blade knife back to viable tissue.  Nails trimmed x 9.  He will call [facility] for custom orthotics.  Order provided today.  Follow-Up Instructions: No follow-ups on file.   Ortho Exam  Patient is alert, oriented, no adenopathy, well-dressed, normal affect, normal respiratory effort. On examination of the left foot the great toe is surgically absent.  He does have Wagner grade 1 ulcer beneath the second metatarsal head with surrounding callus this was debrided of nonviable tissue ulcer 1 cm x 5 mm with flat pink tissue there is 1 mm of depth no active drainage he does have thickened and discolored onychomycotic nails x 9 these are trimmed today without incident  Imaging: No results found. No images are attached to the encounter.  Labs: Lab Results  Component Value Date   HGBA1C 6.3 (H) 04/18/2022   HGBA1C 6.2 09/22/2021   HGBA1C 6.0 (H) 05/07/2021   REPTSTATUS 05/13/2021 FINAL 05/08/2021   GRAMSTAIN  05/08/2021    FEW WBC PRESENT, PREDOMINANTLY PMN FEW GRAM POSITIVE COCCI    CULT  05/08/2021    MODERATE STAPHYLOCOCCUS AUREUS NO ANAEROBES ISOLATED Performed at [facility], [place] [address], [place], [place] [address]    LABORGA STAPHYLOCOCCUS AUREUS  05/08/2021     Lab Results  Component Value Date   ALBUMIN 3.7 05/05/2022   ALBUMIN 4.2 04/20/2022   ALBUMIN 3.9 04/19/2022    Lab Results  Component Value Date   MG 2.8 (H) 09/26/2016   MG 2.8 (H) 09/25/2016   MG 2.8 (H) 09/24/2016   Lab Results  Component Value Date   VD25OH 6.8 (L) 09/14/2016    No results found for: "PREALBUMIN"    Latest Ref Rng & Units 05/11/2022   11:06 AM 05/05/2022    5:08 AM 05/04/2022    2:50 PM  CBC EXTENDED  WBC 3.4 - 10.8 x10E3/uL 10.7  9.2    RBC 4.14 - 5.80 x10E6/uL 4.93  4.81    Hemoglobin 13.0 - 17.7 g/dL 13.2  13.0  14.6   HCT 37.5 - 51.0 % 39.2  39.1  43.0   Platelets 150 - 450 x10E3/uL 305  210       There is no height or weight on file to calculate BMI.  Orders:  No orders of the defined types were placed in this encounter.  No orders of the defined types were placed in this encounter.    Procedures: No procedures performed  Clinical Data: No additional findings.  ROS:  All other systems negative, except as noted in the HPI. Review of Systems  Objective: Vital Signs: There were no vitals taken for this visit.  Specialty Comments:  No specialty comments available.  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   Thrombus 05/04/2022   Ischemia of left upper extremity [id number]   Complication of vascular access for dialysis 04/19/2022   ESRD (end stage renal disease) ([place]) 04/19/2022   Failure of hemodialysis access ([place]) 04/18/2022   Left hand pain 09/22/2021   Diabetic foot infection ([place])    Wound infection 05/07/2021   MSSA bacteremia 05/07/2021   Osteomyelitis ([place]) 05/07/2021   Abscess 05/07/2021   Sepsis due to cellulitis ([place]) 05/06/2021   Poor compliance with medication 03/17/2021   Fluid overload, unspecified [id number]   Complication of vascular dialysis catheter 03/23/2020   Other disorders of phosphorus metabolism  01/28/2020   Hypertension 01/08/2020   Hypocalcemia 10/29/2019   ESRD on dialysis ([place]) 11/16/2017   Pruritus, unspecified 04/04/2017   Unspecified protein-calorie malnutrition ([place]) 11/11/2016   Headache, unspecified 10/29/2016   Encounter for immunization 10/19/2016   Iron deficiency anemia, unspecified 10/10/2016   Anemia in chronic kidney disease 09/30/2016   Coagulation defect, unspecified ([place]) 09/30/2016   Pneumonia due to Pseudomonas ([place]) 09/30/2016   Secondary hyperparathyroidism of renal origin ([place]) 09/30/2016   Hypothyroidism 10/29/2009   Type 2 diabetes mellitus ([place]) 04/14/2009   Hyperlipidemia 04/14/2009   OBESITY, MORBID 04/14/2009   Essential hypertension, benign 04/14/2009   Past Medical History:  Diagnosis Date   Acute hypoxemic respiratory failure ([place]) 09/11/2016   Anemia    Chronic kidney disease    ARF on CRF Dialysis T/TH/Sa   Diabetes mellitus    Type II   End stage renal disease on dialysis ([place])    [place]    GERD (gastroesophageal reflux disease)    diet controlled   HLD (hyperlipidemia)    Hypertension    Hypothyroidism    Morbid obesity ([place])    Sacral wound    resolved   Thyroid disease     Family History  Problem Relation Age of Onset   Heart disease Mother    Hypertension Mother     Past Surgical History:  Procedure Laterality Date   AMPUTATION Left 05/08/2021   Procedure: AMPUTATION GREAT TOE;  Surgeon: Newt Minion, MD;  Location: [facility] [facility];  Service: Orthopedics;  Laterality: Left;   AV FISTULA PLACEMENT Left 09/26/2016   Procedure: LEFT UPPER ARM ARTERIOVENOUS (AV) FISTULA CREATION;  Surgeon: Elam Dutch, MD;  Location: [facility] [facility];  Service: Vascular;  Laterality: Left;   AV FISTULA PLACEMENT Left 12/02/2016   Procedure: INSERTION OF ARTERIOVENOUS GORE-TEX GRAFT LEFT UPPER  ARM USING A 4-7MM BY 45CM GRAFT ;  Surgeon: Rosetta Posner, MD;  Location: [facility];  Service: Vascular;  Laterality: Left;   AV FISTULA PLACEMENT Right  07/03/2020   Procedure: INSERTION OF ARTERIOVENOUS (AV) GORE-TEX GRAFT RIGHT UPPER ARM;  Surgeon: Marty Heck, MD;  Location: [facility];  Service: Vascular;  Laterality: Right;   AV FISTULA PLACEMENT Left 07/02/2021   Procedure: LEFT UPPER EXTREMITY ARTERIOVENOUS FISTULA CREATION;  Surgeon: Waynetta Sandy, MD;  Location: [facility];  Service: Vascular;  Laterality: Left;   [place] Right 05/06/2020   Procedure: FIRST STAGE RIGHT [place];  Surgeon: Serafina Mitchell, MD;  Location: [facility];  Service: Vascular;  Laterality: Right;   [place] Left 08/20/2021   Procedure: LEFT ARM SECOND STAGE  WITH ARTERIOVENOUS GRAFT;  Surgeon: Waynetta Sandy, MD;  Location: [facility];  Service: Vascular;  Laterality: Left;   EXCHANGE OF A DIALYSIS CATHETER Right 10/08/2016   Procedure: EXCHANGE OF A DIALYSIS CATHETER-RIGHT INTERNAL JUGULAR;  Surgeon: Conrad Tangip[place]oa, MD;  Location: [facility];  Service: Vascular;  Laterality: Right;   INSERTION OF DIALYSIS CATHETER Right 09/26/2016   Procedure: INSERTION OF DIALYSIS CATHETER - Right Internal Jugular Placement;  Surgeon: Elam Dutch, MD;  Location: [facility];  Service: Vascular;  Laterality: Right;   INSERTION OF DIALYSIS CATHETER Left 03/22/2018   Procedure: INSERTION OF DIALYSIS CATHETER;  Surgeon: Marty Heck, MD;  Location: [facility];  Service: Vascular;  Laterality: Left;   INTRAOPERATIVE ARTERIOGRAM Left 05/04/2022   Procedure: INTRA OPERATIVE ARTERIOGRAM;  Surgeon: Broadus John, MD;  Location: [facility];  Service: Vascular;  Laterality: Left;   IR DIALY SHUNT INTRO NEEDLE/INTRACATH INITIAL W/IMG LEFT Left 04/15/2022   IR FLUORO GUIDE CV LINE LEFT  04/19/2022   IR FLUORO GUIDE CV LINE LEFT  05/30/2022   IR FLUORO GUIDE CV LINE RIGHT  03/26/2021   IR FLUORO GUIDE CV LINE RIGHT  05/13/2021   IR FLUORO GUIDE CV LINE RIGHT  05/04/2022   IR REMOVAL TUN CV CATH W/O FL  05/11/2021   IR REMOVAL TUN CV  CATH W/O FL  10/06/2021   IR THROMBECTOMY AV FISTULA W/THROMBOLYSIS/PTA INC/SHUNT/IMG LEFT Left 02/02/2022   IR THROMBECTOMY AV FISTULA W/THROMBOLYSIS/PTA INC/SHUNT/IMG LEFT Left 03/22/2022   IR THROMBECTOMY AV FISTULA W/THROMBOLYSIS/PTA INC/SHUNT/IMG LEFT Left 05/04/2022   IR THROMBECTOMY AV FISTULA W/THROMBOLYSIS/PTA/STENT INC/SHUNT/IMG LT Left 04/27/2022   IR US GUIDE VASC ACCESS LEFT  02/02/2022   IR US GUIDE VASC ACCESS LEFT  03/22/2022   IR US GUIDE VASC ACCESS LEFT  04/15/2022   IR US GUIDE VASC ACCESS LEFT  04/19/2022   IR US GUIDE [place] LEFT  04/27/2022   IR US GUIDE VASC ACCESS RIGHT  03/26/2021   IR US GUIDE VASC ACCESS RIGHT  05/13/2021   IR US GUIDE VASC ACCESS RIGHT  05/04/2022   REVISON OF ARTERIOVENOUS FISTULA Left 03/22/2018   Procedure: LEFT UPPER EXTREMITY ARTERIOVENOUS  REVISION WITH GORE-TEX GRAFT.;  Surgeon: Marty Heck, MD;  Location: [facility];  Service: Vascular;  Laterality: Left;   TEE WITHOUT CARDIOVERSION N/A 05/12/2021   Procedure: TRANSESOPHAGEAL ECHOCARDIOGRAM (TEE);  Surgeon: Werner Lean, MD;  [facility];  Service: Cardiovascular;  Laterality: N/A;   THROMBECTOMY Left 08/26/2019   thrombectomy of LUA loop AVG   THROMBECTOMY AND REVISION OF ARTERIOVENTOUS (AV) GORETEX  GRAFT Right 07/27/2020   Procedure: INSERTION OF RIGHT ARM LOOP GRAFT WITH EXCISION OF RIGHT ARM BRACHIAL AXILLARY GRAFT;  Surgeon: Marty Heck, MD;  Location: [place];  Service: Vascular;  Laterality: Right;   THROMBECTOMY BRACHIAL ARTERY Left 05/04/2022   Procedure: LEFT ARM RADIAL BRACHIAL ARTERY EMBOLECTOMY;  Surgeon: Broadus John, MD;  Location: [facility];  Service: Vascular;  Laterality: Left;   UPPER EXTREMITY VENOGRAPHY Bilateral 06/14/2021   Procedure: UPPER EXTREMITY VENOGRAPHY;  Surgeon: Waynetta Sandy, MD;  Location: [facility];  Service: Cardiovascular;  Laterality: Bilateral;   VISCERAL ANGIOGRAPHY Right 06/17/2020   Procedure:  CENTRAL VENO;  Surgeon: Cherre Robins, MD;  Location: [facility];  Service: Cardiovascular;  Laterality: Right;   Social History   Occupational History   Not on file  Tobacco Use   Smoking status: Never   Smokeless tobacco: Never  Vaping Use   Vaping Use: Never used  Substance and Sexual Activity   Alcohol use: No   Drug use: Yes    Frequency: 2.0 times per week    Types: Marijuana    Comment: Last use was on 08/17/21   Sexual activity: Yes

## 2022-07-21 DIAGNOSIS — Z992 Dependence on renal dialysis: Secondary | ICD-10-CM | POA: Diagnosis not present

## 2022-07-21 DIAGNOSIS — E1129 Type 2 diabetes mellitus with other diabetic kidney complication: Secondary | ICD-10-CM | POA: Diagnosis not present

## 2022-07-21 DIAGNOSIS — N186 End stage renal disease: Secondary | ICD-10-CM | POA: Diagnosis not present

## 2022-07-22 ENCOUNTER — Other Ambulatory Visit: Payer: Self-pay | Admitting: Student

## 2022-07-30 ENCOUNTER — Encounter (HOSPITAL_COMMUNITY): Payer: Self-pay

## 2022-07-30 ENCOUNTER — Emergency Department (HOSPITAL_COMMUNITY)
Admission: EM | Admit: 2022-07-30 | Discharge: 2022-07-30 | Disposition: A | Discharge status: Home / Self Care | Payer: Medicaid Other | Attending: Emergency Medicine | Admitting: Emergency Medicine

## 2022-07-30 ENCOUNTER — Other Ambulatory Visit: Payer: Self-pay

## 2022-07-30 ENCOUNTER — Emergency Department (HOSPITAL_COMMUNITY): Payer: Medicaid Other

## 2022-07-30 DIAGNOSIS — Z7982 Long term (current) use of aspirin: Secondary | ICD-10-CM | POA: Diagnosis not present

## 2022-07-30 DIAGNOSIS — Z7901 Long term (current) use of anticoagulants: Secondary | ICD-10-CM | POA: Insufficient documentation

## 2022-07-30 DIAGNOSIS — Z79899 Other long term (current) drug therapy: Secondary | ICD-10-CM | POA: Diagnosis not present

## 2022-07-30 DIAGNOSIS — N186 End stage renal disease: Secondary | ICD-10-CM | POA: Insufficient documentation

## 2022-07-30 DIAGNOSIS — R059 Cough, unspecified: Secondary | ICD-10-CM | POA: Diagnosis not present

## 2022-07-30 DIAGNOSIS — E1122 Type 2 diabetes mellitus with diabetic chronic kidney disease: Secondary | ICD-10-CM | POA: Insufficient documentation

## 2022-07-30 DIAGNOSIS — Z7984 Long term (current) use of oral hypoglycemic drugs: Secondary | ICD-10-CM | POA: Insufficient documentation

## 2022-07-30 DIAGNOSIS — Z794 Long term (current) use of insulin: Secondary | ICD-10-CM | POA: Insufficient documentation

## 2022-07-30 DIAGNOSIS — I120 Hypertensive chronic kidney disease with stage 5 chronic kidney disease or end stage renal disease: Secondary | ICD-10-CM | POA: Diagnosis not present

## 2022-07-30 DIAGNOSIS — J069 Acute upper respiratory infection, unspecified: Secondary | ICD-10-CM | POA: Diagnosis not present

## 2022-07-30 DIAGNOSIS — Z992 Dependence on renal dialysis: Secondary | ICD-10-CM | POA: Insufficient documentation

## 2022-07-30 DIAGNOSIS — Z20822 Contact with and (suspected) exposure to covid-19: Secondary | ICD-10-CM | POA: Diagnosis not present

## 2022-07-30 DIAGNOSIS — R0602 Shortness of breath: Secondary | ICD-10-CM | POA: Diagnosis not present

## 2022-07-30 LAB — RESP PANEL BY RT-PCR (RSV, FLU A&B, COVID)  RVPGX2
Influenza A by PCR: NEGATIVE
Influenza B by PCR: NEGATIVE
Resp Syncytial Virus by PCR: NEGATIVE
SARS Coronavirus 2 by RT PCR: NEGATIVE

## 2022-07-30 MED ORDER — IPRATROPIUM-ALBUTEROL 0.5-2.5 (3) MG/3ML IN SOLN
3.0000 mL | Freq: Once | RESPIRATORY_TRACT | Status: AC
  Administered 2022-07-30: 3 mL via RESPIRATORY_TRACT
  Filled 2022-07-30: qty 3

## 2022-07-30 MED ORDER — GUAIFENESIN 100 MG/5ML PO LIQD: 100.0000 mg | ORAL | 0 refills | Status: DC | PRN

## 2022-07-30 MED ORDER — DEXAMETHASONE SODIUM PHOSPHATE 10 MG/ML IJ SOLN
10.0000 mg | Freq: Once | INTRAMUSCULAR | Status: AC
  Administered 2022-07-30: 10 mg via INTRAMUSCULAR
  Filled 2022-07-30: qty 1

## 2022-07-30 MED ORDER — ALBUTEROL SULFATE (2.5 MG/3ML) 0.083% IN NEBU
5.0000 mg | INHALATION_SOLUTION | Freq: Once | RESPIRATORY_TRACT | Status: AC
  Administered 2022-07-30: 5 mg via RESPIRATORY_TRACT
  Filled 2022-07-30: qty 6

## 2022-07-30 NOTE — ED Provider Notes (Signed)
[place] EMERGENCY DEPARTMENT AT [facility] Provider Note   CSN: [id number] Arrival date & time: 07/30/22  1132     History  Chief Complaint  Patient presents with   Cough   Shortness of Breath    Paul [name] is a 32 y.o. male.  With past medical history of type 2 diabetes, end-stage renal disease on hemodialysis Tuesday Thursday Saturday, hypertension, obesity who presents to the emergency department for cough.  Patient states that he has had cough and congestion for about 5 days.  He states that he has overall been fatigued.  He describes his cough becoming productive and having yellow phlegm.  He has been having intermittent chills and sweats.  Feels mildly short of breath.  He had diarrhea yesterday.  He denies having chest pain or palpitation, nausea or vomiting s.  He states that his mom is also sick with similar symptoms.  He most recently had dialysis today with no issue.   Cough Associated symptoms: chills and shortness of breath   Shortness of Breath Associated symptoms: cough        Home Medications Prior to Admission medications   Medication Sig Start Date End Date Taking? Authorizing Provider  guaiFENesin (ROBITUSSIN) 100 MG/5ML liquid Take 5-10 mLs (100-200 mg total) by mouth every 4 (four) hours as needed for cough or to loosen phlegm. 07/30/22  Yes Cristopher Peru, PA-C  Accu-Chek Softclix Lancets lancets Use as instructed 06/11/21   Alicia Amel, MD  acetaminophen (TYLENOL) 500 MG tablet Take 1,000 mg by mouth every 6 (six) hours as needed (for pain.).    [provider]  aspirin EC 81 MG tablet Take 1 tablet (81 mg total) by mouth daily. Swallow whole. 05/11/22   Dameron, Nolberto Hanlon, DO  Blood Glucose Monitoring Suppl (ACCU-CHEK GUIDE ME) w/Device KIT 1 kit by Does not apply route daily. 05/22/21   Alicia Amel, MD  calcium carbonate (TUMS - DOSED IN MG ELEMENTAL CALCIUM) 500 MG chewable tablet Chew 1,000 mg by mouth daily as needed for  indigestion or heartburn.    [provider]  cetirizine (ZYRTEC) 10 MG tablet Take 1 tablet (10 mg total) by mouth daily as needed for allergies. 05/11/22   Dameron, Nolberto Hanlon, DO  cinacalcet (SENSIPAR) 30 MG tablet Take 30 mg by mouth daily. 08/12/21   [provider]  ELIQUIS 5 MG TABS tablet Take 1 tablet by mouth twice daily 07/11/22   Dameron, Nolberto Hanlon, DO  ferric citrate (AURYXIA) 1 GM 210 MG(Fe) tablet Take 2-4 tablets (420-840 mg total) by mouth See admin instructions. Take 4 tablets (840 mg) by mouth with each meal & take 2 tablets (420 mg) by mouth with each snack. 05/11/22   Dameron, Nolberto Hanlon, DO  gabapentin (NEURONTIN) 100 MG capsule TAKE 1 CAPSULE BY MOUTH THREE TIMES DAILY 05/09/22   Maeola Harman, MD  gabapentin (NEURONTIN) 300 MG capsule Take 1 capsule by mouth at bedtime 07/25/22   Dameron, Nolberto Hanlon, DO  glucose blood (ACCU-CHEK GUIDE) test strip 1 each by Other route 2 (two) times daily. Use as instructed 09/22/21   Shelby Mattocks, DO  LANTUS 100 UNIT/ML injection Inject 0.05 mLs (5 Units total) into the skin 2 (two) times daily. 05/11/22   Dameron, Nolberto Hanlon, DO  levothyroxine (SYNTHROID) 150 MCG tablet Take 1 tablet (150 mcg total) by mouth daily before breakfast. 05/11/22   Dameron, Nolberto Hanlon, DO  midodrine (PROAMATINE) 5 MG tablet Take 1 tablet (5 mg total) by mouth Every Tuesday,Thursday,and Saturday  with dialysis. 05/12/22   Dameron, Nolberto Hanlon, DO  multivitamin (RENA-VIT) TABS tablet Take 1 tablet by mouth at bedtime. 05/11/22   Dameron, Nolberto Hanlon, DO  oxyCODONE-acetaminophen (PERCOCET) 5-325 MG tablet Take 1 tablet by mouth every 6 (six) hours as needed for severe pain. 05/09/22 05/09/23  Emilie Rutter, PA-C  rosuvastatin (CRESTOR) 20 MG tablet Take 1 tablet (20 mg total) by mouth daily. 05/11/22   Dameron, Nolberto Hanlon, DO  Syringe, Disposable, (B-D SYRINGE SLIP TIP 30CC) 30 ML MISC 1 Syringe by Does not apply route daily. 01/31/19   Melene Plan, MD      Allergies     Hydralazine hcl    Review of Systems   Review of Systems  Constitutional:  Positive for chills.  Respiratory:  Positive for cough and shortness of breath.   All other systems reviewed and are negative.   Physical Exam Updated Vital Signs BP 128/89 (BP Location: Right Arm)   Pulse (!) 103   Temp 97.9 F (36.6 C) (Oral)   Resp 15   Ht 6\' 2"  (1.88 m)   Wt (!) 147.4 kg   SpO2 99%   BMI 41.73 kg/m  Physical Exam Vitals and nursing note reviewed.  Constitutional:      General: He is not in acute distress.    Appearance: Normal appearance. He is well-developed. He is obese. He is ill-appearing. He is not toxic-appearing.  HENT:     Head: Normocephalic.     Mouth/Throat:     Mouth: Mucous membranes are moist.     Pharynx: Oropharynx is clear.  Eyes:     General: No scleral icterus. Cardiovascular:     Rate and Rhythm: Normal rate and regular rhythm.     Pulses: Normal pulses.     Heart sounds: No murmur heard. Pulmonary:     Effort: Pulmonary effort is normal. No tachypnea or respiratory distress.     Breath sounds: Examination of the right-upper field reveals wheezing. Examination of the left-upper field reveals wheezing. Examination of the right-lower field reveals rales. Examination of the left-lower field reveals wheezing. Wheezing and rales present.  Abdominal:     General: Bowel sounds are normal.     Palpations: Abdomen is soft.  Musculoskeletal:     Cervical back: Normal range of motion.  Skin:    General: Skin is warm and dry.     Capillary Refill: Capillary refill takes less than 2 seconds.  Neurological:     General: No focal deficit present.     Mental Status: He is alert and oriented to person, place, and time.  Psychiatric:        Mood and Affect: Mood normal.        Behavior: Behavior normal.     ED Results / Procedures / Treatments   Labs (all labs ordered are listed, but only abnormal results are displayed) Labs Reviewed  RESP PANEL BY RT-PCR  (RSV, FLU A&B, COVID)  [id number]   EKG None  Radiology DG Chest 2 View  Result Date: 07/30/2022 CLINICAL DATA:  One-week history of shortness of breath and cough EXAM: CHEST - 2 VIEW COMPARISON:  Chest radiograph dated 04/11/2022 FINDINGS: Right diaphragmatic eventration. Radiodensity projects over the paraspinal lower lobes on lateral view. No focal consolidations. No pleural effusion or pneumothorax. The heart size and mediastinal contours are within normal limits. The visualized skeletal structures are unremarkable. Right axillary stent graft. IMPRESSION: 1. No active cardiopulmonary disease. 2. Radiodensity projects over the paraspinal lower lobes on lateral  view, indeterminate but possibly superimposition of osseous structures. Consider chest CT for further evaluation, as clinically indicated. Electronically Signed   By: Agustin Cree M.D.   On: 07/30/2022 12:57    Procedures Procedures   Medications Ordered in ED Medications  albuterol (PROVENTIL) (2.5 MG/3ML) 0.083% nebulizer solution 5 mg (5 mg Nebulization Given 07/30/22 1308)  ipratropium-albuterol (DUONEB) 0.5-2.5 (3) MG/3ML nebulizer solution 3 mL (3 mLs Nebulization Given 07/30/22 1357)  dexamethasone (DECADRON) injection 10 mg (10 mg Intramuscular Given 07/30/22 1358)    ED Course/ Medical Decision Making/ A&P          Medical Decision Making Amount and/or Complexity of Data Reviewed Radiology: ordered.  Risk OTC drugs. Prescription drug management.  Initial Impression and Ddx 32 year old male who presents to the emergency department with cough and congestion over the past week. Patient PMH that increases complexity of ED encounter: End-stage renal disease on hemodialysis, hypertension, obesity, type 2 diabetes.  Interpretation of Diagnostics I independent reviewed and interpreted the labs as followed: COVID, flu, RSV negative  - I independently visualized the following imaging with scope of interpretation limited to determining  acute life threatening conditions related to emergency care: Chest x-ray, which revealed no acute findings  Patient Reassessment and Ultimate Disposition/Management 32 year old male who presents with cough and congestion over the past week.  He is overall well-appearing, nonseptic and nontoxic in appearance.  He is hemodynamically stable. He is afebrile and oxygenating 99% on room air. On my initial exam he does have bilateral wheezing present.  I am able to hear his cough which sounds somewhat productive.  Will obtain a chest x-ray along with a respiratory panel and we will provide him with an albuterol treatment.  On reassessment he has some ongoing wheezing which is improved, he also feels mildly improved.  Will give him 1 more treatment and a dose of steroids and reassess again.  COVID, flu, RSV is negative.  Chest x-ray with no evidence of pneumonia.  Reassessed him again and he is feeling better.  I got him up to ambulate with pulse ox and he was satting 95 to 100% throughout the entire time.  Feel that this is likely a viral upper respiratory infection with cough.  There is no evidence of pneumonia.  He does not appear to be septic.  Do not feel that he needs blood work at this time.  Also do not feel that he needs antibiotics at this time.  Do not feel that this is cough from other etiologies such as medication side effect, CHF, COPD, asthma, etc.  Will prescribe him Robitussin.  Can return for worsening symptoms.  Otherwise feel that he is safe for discharge at this time.  The patient has been appropriately medically screened and/or stabilized in the ED. I have low suspicion for any other emergent medical condition which would require further screening, evaluation or treatment in the ED or require inpatient management. At time of discharge the patient is hemodynamically stable and in no acute distress. I have discussed work-up results and diagnosis with patient and answered all questions.  Patient is agreeable with discharge plan. We discussed strict return precautions for returning to the emergency department and they verbalized understanding.     Patient management required discussion with the following services or consulting groups:  None  Complexity of Problems Addressed Acute complicated illness or Injury  Additional Data Reviewed and Analyzed Further history obtained from: Past medical history and medications listed in the EMR, Prior ED visit  notes, and Care Everywhere  Patient Encounter Risk Assessment Prescriptions and SDOH impact on management  Final Clinical Impression(s) / ED Diagnoses Final diagnoses:  Viral URI with cough    Rx / DC Orders ED Discharge Orders          Ordered    guaiFENesin (ROBITUSSIN) 100 MG/5ML liquid  Every 4 hours PRN        07/30/22 1522              Cristopher Peru, PA-C 07/31/22 0272    Wynetta Fines, MD 07/31/22 [phone]

## 2022-07-30 NOTE — ED Notes (Signed)
Patient transported to X-ray 

## 2022-07-30 NOTE — ED Triage Notes (Signed)
Pt to ED c/o cough and SHOB x 1 week, reports known sick contact. No s/s of acute distress noted in triage.

## 2022-07-30 NOTE — Discharge Instructions (Addendum)
You were seen in the emergency department today for cough.  You likely have a viral upper respiratory infection.  You do not have a pneumonia.  I have sent you some cough medication to the pharmacy.  You can use over-the-counter cough and cold medications as well.  You may use Tylenol Motrin for any fever or bodyaches.  Please return to the emergency department for any worsening shortness of breath or difficulty breathing.

## 2022-08-01 ENCOUNTER — Ambulatory Visit: Payer: Medicaid Other | Admitting: Orthopedic Surgery

## 2022-08-02 ENCOUNTER — Telehealth: Payer: Self-pay

## 2022-08-02 NOTE — Transitions of Care (Post Inpatient/ED Visit) (Signed)
   08/02/2022  Name: Paul [name] MRN: [id number] DOB: [birth year]  Today's TOC FU Call Status: Today's TOC FU Call Status:: Successful TOC FU Call Competed TOC FU Call Complete Date: 08/02/22  Transition Care Management Follow-up Telephone Call Date of Discharge: 07/30/22 Discharge [facility]) Type of Discharge: Emergency Department Reason for ED Visit:  (cough) How have you been since you were released from the hospital?: Better Any questions or concerns?: No  Items Reviewed: Did you receive and understand the discharge instructions provided?: Yes Medications obtained and verified?: No (noneprescribed) Any new allergies since your discharge?: No Dietary orders reviewed?: NA Do you have support at home?: Yes  Home Care and Equipment/Supplies: [place] Ordered?: NA Any new equipment or medical supplies ordered?: NA  Functional Questionnaire: Do you need assistance with bathing/showering or dressing?: No Do you need assistance with meal preparation?: No Do you need assistance with eating?: No Do you have difficulty maintaining continence: No Do you need assistance with getting out of bed/getting out of a chair/moving?: No Do you have difficulty managing or taking your medications?: No  Folllow up appointments reviewed: PCP Follow-up appointment confirmed?: No MD Provider Line Number:[phone] Given: No Specialist Hospital Follow-up appointment confirmed?: No Reason Specialist Follow-Up Not Confirmed: Patient has Specialist Provider Number and will Call for Appointment Do you need transportation to your follow-up appointment?: No Do you understand care options if your condition(s) worsen?: Yes-patient verbalized understanding    Mickel Fuchs, BSW, [place] Medicaid Team  [phone]

## 2022-08-04 ENCOUNTER — Other Ambulatory Visit: Payer: Self-pay | Admitting: Student

## 2022-08-04 ENCOUNTER — Other Ambulatory Visit: Payer: Self-pay | Admitting: Orthopedic Surgery

## 2022-08-04 ENCOUNTER — Telehealth: Payer: Self-pay | Admitting: Family

## 2022-08-04 MED ORDER — HYDROCODONE-ACETAMINOPHEN 5-325 MG PO TABS: 1.0000 | ORAL_TABLET | Freq: Four times a day (QID) | ORAL | 0 refills | Status: DC | PRN

## 2022-08-04 NOTE — Telephone Encounter (Signed)
Pt informed

## 2022-08-04 NOTE — Telephone Encounter (Signed)
Patient called in requesting hydrocodone refill please advise

## 2022-08-04 NOTE — Telephone Encounter (Signed)
Pt recently seen on 07/20/22. He is s/p right GT amputation, he had oxycodone filled on 05/09/22 by vascular for a procedure he had with them. I don't see where he has been on hydrocodone. Please advise if appropriate.

## 2022-08-10 ENCOUNTER — Encounter: Payer: Self-pay | Admitting: Family

## 2022-08-10 ENCOUNTER — Ambulatory Visit (INDEPENDENT_AMBULATORY_CARE_PROVIDER_SITE_OTHER): Payer: Medicaid Other | Admitting: Family

## 2022-08-10 DIAGNOSIS — B351 Tinea unguium: Secondary | ICD-10-CM

## 2022-08-10 DIAGNOSIS — Z89432 Acquired absence of left foot: Secondary | ICD-10-CM | POA: Diagnosis not present

## 2022-08-10 DIAGNOSIS — L97521 Non-pressure chronic ulcer of other part of left foot limited to breakdown of skin: Secondary | ICD-10-CM | POA: Diagnosis not present

## 2022-08-10 NOTE — Progress Notes (Signed)
Office Visit Note   Patient: Paul [name]           Date of Birth: [birth year]           MRN: [id number] Visit Date: 08/10/2022              Requested by: Orvis Brill, DO [address] 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    Assessment & Plan: Visit Diagnoses: No diagnosis found.  Plan: Ulcer to left foot debrided with 10 blade knife back to viable tissue.  Nails trimmed x 9.  Given the felt pressure relieving donut for offloading the ulcer to the area.  Follow-Up Instructions: No follow-ups on file.   Ortho Exam  Patient is alert, oriented, no adenopathy, well-dressed, normal affect, normal respiratory effort. On examination of the left foot the great toe is surgically absent.  He does have Wagner grade 1 ulcer beneath the second metatarsal head with surrounding callus this was debrided of nonviable tissue ulcer postdebridement measures 2 cm in diameter with scant surrounding maceration.  The ulcer is 3 mm deep.  no active drainage he does have thickened and discolored onychomycotic nails x 9 these are trimmed today without incident  Imaging: No results found. No images are attached to the encounter.  Labs: Lab Results  Component Value Date   HGBA1C 6.3 (H) 04/18/2022   HGBA1C 6.2 09/22/2021   HGBA1C 6.0 (H) 05/07/2021   REPTSTATUS 05/13/2021 FINAL 05/08/2021   GRAMSTAIN  05/08/2021    FEW WBC PRESENT, PREDOMINANTLY PMN FEW GRAM POSITIVE COCCI    CULT  05/08/2021    MODERATE STAPHYLOCOCCUS AUREUS NO ANAEROBES ISOLATED Performed at [facility], [place] [address], [place], [place] [address]    LABORGA STAPHYLOCOCCUS AUREUS  05/08/2021     Lab Results  Component Value Date   ALBUMIN 3.7 05/05/2022   ALBUMIN 4.2 04/20/2022   ALBUMIN 3.9 04/19/2022    Lab Results  Component Value Date   MG 2.8 (H) 09/26/2016   MG 2.8 (H) 09/25/2016   MG 2.8 (H) 09/24/2016   Lab Results  Component Value Date   VD25OH 6.8 (L) 09/14/2016    No results found for: "PREALBUMIN"    Latest Ref Rng & Units 05/11/2022   11:06 AM 05/05/2022    5:08 AM 05/04/2022    2:50 PM  CBC EXTENDED  WBC 3.4 - 10.8 x10E3/uL 10.7  9.2    RBC 4.14 - 5.80 x10E6/uL 4.93  4.81    Hemoglobin 13.0 - 17.7 g/dL 13.2  13.0  14.6   HCT 37.5 - 51.0 % 39.2  39.1  43.0   Platelets 150 - 450 x10E3/uL 305  210       There is no height or weight on file to calculate BMI.  Orders:  No orders of the defined types were placed in this encounter.  No orders of the defined types were placed in this encounter.    Procedures: No procedures performed  Clinical Data: No additional findings.  ROS:  All other systems negative, except as noted in the HPI. Review of Systems  Objective: Vital Signs:  There were no vitals taken for this visit.  Specialty Comments:  No specialty comments available.  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   Thrombus 05/04/2022   Ischemia of left upper extremity 123XX123   Complication of vascular access for dialysis 04/19/2022   ESRD (end stage renal disease) ([place]) 04/19/2022   Failure of hemodialysis access ([place]) 04/18/2022   Left hand pain 09/22/2021   Diabetic foot infection ([place])    Wound infection 05/07/2021   MSSA bacteremia 05/07/2021   Osteomyelitis ([place]) 05/07/2021   Abscess 05/07/2021   Sepsis due to cellulitis ([place]) 05/06/2021   Poor compliance with medication 03/17/2021   Fluid overload, unspecified [id number]   Complication of vascular dialysis catheter 03/23/2020   Other disorders of phosphorus metabolism  01/28/2020   Hypertension 01/08/2020   Hypocalcemia 10/29/2019   ESRD on dialysis ([place]) 11/16/2017   Pruritus, unspecified 04/04/2017   Unspecified protein-calorie malnutrition ([place]) 11/11/2016   Headache, unspecified 10/29/2016   Encounter for immunization 10/19/2016   Iron deficiency anemia, unspecified 10/10/2016   Anemia in chronic kidney disease 09/30/2016   Coagulation defect, unspecified ([place]) 09/30/2016   Pneumonia due to Pseudomonas ([place]) 09/30/2016   Secondary hyperparathyroidism of renal origin ([place]) 09/30/2016   Hypothyroidism 10/29/2009   Type 2 diabetes mellitus ([place]) 04/14/2009   Hyperlipidemia 04/14/2009   OBESITY, MORBID 04/14/2009   Essential hypertension, benign 04/14/2009   Past Medical History:  Diagnosis Date   Acute hypoxemic respiratory failure ([place]) 09/11/2016   Anemia    Chronic kidney disease    ARF on CRF Dialysis T/TH/Sa   Diabetes mellitus    Type II   End stage renal disease on dialysis ([place])    [place]    GERD (gastroesophageal reflux disease)    diet controlled   HLD (hyperlipidemia)    Hypertension    Hypothyroidism    Morbid obesity ([place])    Sacral wound    resolved   Thyroid disease     Family History  Problem Relation Age of Onset   Heart disease Mother    Hypertension Mother     Past Surgical History:  Procedure Laterality Date   AMPUTATION Left 05/08/2021   Procedure: AMPUTATION GREAT TOE;  Surgeon: Newt Minion, MD;  Location: [place];  Service: Orthopedics;  Laterality: Left;   AV FISTULA PLACEMENT Left 09/26/2016   Procedure: LEFT UPPER ARM ARTERIOVENOUS (AV) FISTULA CREATION;  Surgeon: Elam Dutch, MD;  Location: [place];  Service: Vascular;  Laterality: Left;   AV FISTULA PLACEMENT Left 12/02/2016   Procedure: INSERTION OF ARTERIOVENOUS GORE-TEX GRAFT LEFT UPPER  ARM USING A 4-7MM BY 45CM GRAFT ;  Surgeon: Rosetta Posner, MD;  Location: [place];  Service: Vascular;  Laterality: Left;   AV FISTULA PLACEMENT Right  07/03/2020   Procedure: INSERTION OF ARTERIOVENOUS (AV) GORE-TEX GRAFT RIGHT UPPER ARM;  Surgeon: Marty Heck, MD;  Location: [place];  Service: Vascular;  Laterality: Right;   AV FISTULA PLACEMENT Left 07/02/2021   Procedure: LEFT UPPER EXTREMITY ARTERIOVENOUS FISTULA CREATION;  Surgeon: Waynetta Sandy, MD;  Location: [place];  Service: Vascular;  Laterality: Left;   [place] Right 05/06/2020   Procedure: FIRST STAGE RIGHT [place];  Surgeon: Serafina Mitchell, MD;  Location: [facility];  Service: Vascular;  Laterality: Right;   [place] Left 08/20/2021   Procedure: LEFT ARM SECOND STAGE WITH ARTERIOVENOUS GRAFT;  Surgeon: Waynetta Sandy, MD;  Location: [place];  Service: Vascular;  Laterality: Left;   EXCHANGE OF A DIALYSIS CATHETER Right 10/08/2016   Procedure: EXCHANGE OF A DIALYSIS CATHETER-RIGHT INTERNAL JUGULAR;  Surgeon: Conrad Merrick, MD;  Location: [place];  Service: Vascular;  Laterality: Right;   INSERTION OF DIALYSIS CATHETER Right 09/26/2016   Procedure: INSERTION OF DIALYSIS CATHETER - Right Internal Jugular Placement;  Surgeon: Elam Dutch, MD;  Location: [place];  Service: Vascular;  Laterality: Right;   INSERTION OF DIALYSIS CATHETER Left 03/22/2018   Procedure: INSERTION OF DIALYSIS CATHETER;  Surgeon: Marty Heck, MD;  Location: [place];  Service: Vascular;  Laterality: Left;   INTRAOPERATIVE ARTERIOGRAM Left 05/04/2022   Procedure: INTRA OPERATIVE ARTERIOGRAM;  Surgeon: Broadus John, MD;  Location: [place];  Service: Vascular;  Laterality: Left;   IR DIALY SHUNT INTRO NEEDLE/INTRACATH INITIAL W/IMG LEFT Left 04/15/2022   IR FLUORO GUIDE CV LINE LEFT  04/19/2022   IR FLUORO GUIDE CV LINE LEFT  05/30/2022   IR FLUORO GUIDE CV LINE RIGHT  03/26/2021   IR FLUORO GUIDE CV LINE RIGHT  05/13/2021   IR FLUORO GUIDE CV LINE RIGHT  05/04/2022   IR REMOVAL TUN CV CATH W/O FL  05/11/2021   IR REMOVAL TUN CV  CATH W/O FL  10/06/2021   IR THROMBECTOMY AV FISTULA W/THROMBOLYSIS/PTA INC/SHUNT/IMG LEFT Left 02/02/2022   IR THROMBECTOMY AV FISTULA W/THROMBOLYSIS/PTA INC/SHUNT/IMG LEFT Left 03/22/2022   IR THROMBECTOMY AV FISTULA W/THROMBOLYSIS/PTA INC/SHUNT/IMG LEFT Left 05/04/2022   IR THROMBECTOMY AV FISTULA W/THROMBOLYSIS/PTA/STENT INC/SHUNT/IMG LT Left 04/27/2022   IR US GUIDE VASC ACCESS LEFT  02/02/2022   IR US GUIDE VASC ACCESS LEFT  03/22/2022   IR US GUIDE VASC ACCESS LEFT  04/15/2022   IR US GUIDE VASC ACCESS LEFT  04/19/2022   IR US GUIDE [place] LEFT  04/27/2022   IR US GUIDE VASC ACCESS RIGHT  03/26/2021   IR US GUIDE VASC ACCESS RIGHT  05/13/2021   IR US GUIDE VASC ACCESS RIGHT  05/04/2022   REVISON OF ARTERIOVENOUS FISTULA Left 03/22/2018   Procedure: LEFT UPPER EXTREMITY ARTERIOVENOUS  REVISION WITH GORE-TEX GRAFT.;  Surgeon: Marty Heck, MD;  Location: [place];  Service: Vascular;  Laterality: Left;   TEE WITHOUT CARDIOVERSION N/A 05/12/2021   Procedure: TRANSESOPHAGEAL ECHOCARDIOGRAM (TEE);  Surgeon: Werner Lean, MD;  Location: [facility]COPY;  Service: Cardiovascular;  Laterality: N/A;   THROMBECTOMY Left 08/26/2019   thrombectomy of LUA loop AVG   THROMBECTOMY AND REVISION OF ARTERIOVENTOUS (AV) GORETEX  GRAFT Right 07/27/2020   Procedure: INSERTION OF RIGHT ARM LOOP GRAFT WITH EXCISION OF RIGHT ARM BRACHIAL AXILLARY GRAFT;  Surgeon: Marty Heck, MD;  Location: [place];  Service: Vascular;  Laterality: Right;   THROMBECTOMY BRACHIAL ARTERY Left 05/04/2022   Procedure: LEFT ARM RADIAL BRACHIAL ARTERY EMBOLECTOMY;  Surgeon: Broadus John, MD;  Location: [place];  Service: Vascular;  Laterality: Left;   UPPER EXTREMITY VENOGRAPHY Bilateral 06/14/2021   Procedure: UPPER EXTREMITY VENOGRAPHY;  Surgeon: Waynetta Sandy, MD;  Location: [place] [facility] LAB;  Service: Cardiovascular;  Laterality: Bilateral;   VISCERAL ANGIOGRAPHY Right 06/17/2020   Procedure:  CENTRAL VENO;  Surgeon: Cherre Robins, MD;  Location: [place] [facility];  Service: Cardiovascular;  Laterality: Right;   Social History   Occupational History   Not on file  Tobacco Use   Smoking status: Never   Smokeless tobacco: Never  Vaping Use  Vaping Use: Never used  Substance and Sexual Activity   Alcohol use: No   Drug use: Yes    Frequency: 2.0 times per week    Types: Marijuana    Comment: Last use was on 08/17/21   Sexual activity: Yes

## 2022-08-12 MED ORDER — HYDROCODONE-ACETAMINOPHEN 5-325 MG PO TABS: 1.0000 | ORAL_TABLET | Freq: Four times a day (QID) | ORAL | 0 refills | Status: DC | PRN

## 2022-08-12 NOTE — Addendum Note (Signed)
Addended by: Suzan Slick on: 08/12/2022 09:36 AM   Modules accepted: Orders

## 2022-08-16 ENCOUNTER — Other Ambulatory Visit (HOSPITAL_COMMUNITY): Payer: Self-pay | Admitting: Internal Medicine

## 2022-08-16 DIAGNOSIS — Z992 Dependence on renal dialysis: Secondary | ICD-10-CM

## 2022-08-16 DIAGNOSIS — N186 End stage renal disease: Secondary | ICD-10-CM

## 2022-08-17 ENCOUNTER — Other Ambulatory Visit: Payer: Self-pay | Admitting: Radiology

## 2022-08-18 ENCOUNTER — Other Ambulatory Visit: Payer: Self-pay | Admitting: Student

## 2022-08-18 DIAGNOSIS — E785 Hyperlipidemia, unspecified: Secondary | ICD-10-CM

## 2022-08-19 ENCOUNTER — Ambulatory Visit (HOSPITAL_COMMUNITY)
Admission: RE | Admit: 2022-08-19 | Discharge: 2022-08-19 | Disposition: A | Discharge status: Home / Self Care | Payer: Medicaid Other | Source: Ambulatory Visit | Attending: Internal Medicine | Admitting: Internal Medicine

## 2022-08-19 ENCOUNTER — Other Ambulatory Visit (HOSPITAL_COMMUNITY): Payer: Self-pay | Admitting: Internal Medicine

## 2022-08-19 ENCOUNTER — Encounter (HOSPITAL_COMMUNITY): Payer: Self-pay

## 2022-08-19 DIAGNOSIS — Z4901 Encounter for fitting and adjustment of extracorporeal dialysis catheter: Secondary | ICD-10-CM | POA: Diagnosis not present

## 2022-08-19 DIAGNOSIS — E1122 Type 2 diabetes mellitus with diabetic chronic kidney disease: Secondary | ICD-10-CM | POA: Insufficient documentation

## 2022-08-19 DIAGNOSIS — N186 End stage renal disease: Secondary | ICD-10-CM | POA: Diagnosis not present

## 2022-08-19 DIAGNOSIS — E785 Hyperlipidemia, unspecified: Secondary | ICD-10-CM | POA: Insufficient documentation

## 2022-08-19 DIAGNOSIS — T8241XA Breakdown (mechanical) of vascular dialysis catheter, initial encounter: Secondary | ICD-10-CM | POA: Diagnosis not present

## 2022-08-19 DIAGNOSIS — T82858A Stenosis of vascular prosthetic devices, implants and grafts, initial encounter: Secondary | ICD-10-CM | POA: Diagnosis not present

## 2022-08-19 DIAGNOSIS — I120 Hypertensive chronic kidney disease with stage 5 chronic kidney disease or end stage renal disease: Secondary | ICD-10-CM | POA: Diagnosis not present

## 2022-08-19 DIAGNOSIS — Z794 Long term (current) use of insulin: Secondary | ICD-10-CM | POA: Diagnosis not present

## 2022-08-19 DIAGNOSIS — K219 Gastro-esophageal reflux disease without esophagitis: Secondary | ICD-10-CM | POA: Diagnosis not present

## 2022-08-19 DIAGNOSIS — Y841 Kidney dialysis as the cause of abnormal reaction of the patient, or of later complication, without mention of misadventure at the time of the procedure: Secondary | ICD-10-CM | POA: Diagnosis not present

## 2022-08-19 HISTORY — PX: IR AV DIALY SHUNT INTRO NEEDLE/INTRACATH INITIAL W/PTA/IMG LEFT: IMG6103

## 2022-08-19 LAB — GLUCOSE, CAPILLARY: Glucose-Capillary: 123 mg/dL — ABNORMAL HIGH (ref 70–99)

## 2022-08-19 MED ORDER — HEPARIN SODIUM (PORCINE) 1000 UNIT/ML IJ SOLN
INTRAMUSCULAR | Status: AC | PRN
  Administered 2022-08-19: 3000 [IU] via INTRAVENOUS

## 2022-08-19 MED ORDER — IOHEXOL 300 MG/ML  SOLN
100.0000 mL | Freq: Once | INTRAMUSCULAR | Status: AC | PRN
  Administered 2022-08-19: 50 mL via INTRAVENOUS

## 2022-08-19 MED ORDER — MIDAZOLAM HCL 2 MG/2ML IJ SOLN
INTRAMUSCULAR | Status: AC
  Filled 2022-08-19: qty 2

## 2022-08-19 MED ORDER — FENTANYL CITRATE (PF) 100 MCG/2ML IJ SOLN
INTRAMUSCULAR | Status: AC
  Filled 2022-08-19: qty 2

## 2022-08-19 MED ORDER — SODIUM CHLORIDE 0.9 % IV SOLN: INTRAVENOUS | Status: DC

## 2022-08-19 MED ORDER — HYDROCODONE-ACETAMINOPHEN 5-325 MG PO TABS: 1.0000 | ORAL_TABLET | ORAL | Status: DC | PRN

## 2022-08-19 MED ORDER — LIDOCAINE-EPINEPHRINE 1 %-1:100000 IJ SOLN
INTRAMUSCULAR | Status: AC
  Administered 2022-08-19: 10 mL
  Filled 2022-08-19: qty 1

## 2022-08-19 MED ORDER — FENTANYL CITRATE (PF) 100 MCG/2ML IJ SOLN
INTRAMUSCULAR | Status: AC | PRN
  Administered 2022-08-19 (×2): 25 ug via INTRAVENOUS

## 2022-08-19 MED ORDER — IOHEXOL 300 MG/ML  SOLN
100.0000 mL | Freq: Once | INTRAMUSCULAR | Status: AC | PRN
  Administered 2022-08-19: 15 mL via INTRAVENOUS

## 2022-08-19 MED ORDER — MIDAZOLAM HCL 2 MG/2ML IJ SOLN
INTRAMUSCULAR | Status: AC | PRN
  Administered 2022-08-19 (×2): 1 mg via INTRAVENOUS

## 2022-08-19 MED ORDER — HEPARIN SODIUM (PORCINE) 1000 UNIT/ML IJ SOLN
INTRAMUSCULAR | Status: AC
  Filled 2022-08-19: qty 10

## 2022-08-19 NOTE — Procedures (Signed)
  Procedure:  LUE fistulogram, angioplasty x2    Preprocedure diagnosis: The encounter diagnosis was ESRD on dialysis [facility]). Postprocedure diagnosis: same EBL:    minimal Complications:   none immediate  See full dictation in [facility].  Dillard Cannon MD Main # [phone] Pager  [phone] Mobile [phone]

## 2022-08-19 NOTE — H&P (Signed)
Chief Complaint: Patient was seen in consultation today for dialysis access malfunction  Referring Physician(s): Peeples,Samuel J  Supervising Physician: Arne Cleveland  Patient Status: [facility] - Out-pt  History of Present Illness: Paul [name] is a 32 y.o. male with past medical history of CKD, DM2, GERD, HLD, HTN known to IR from several recent declotting attempts of his thrombosed left brachial basilic graft.  He was last assessed by IR 05/04/22 by Dr. Dwaine Gale after multiple repeat declotting sessions (04/28/22, 04/15/22, 03/22/22, 01/23/2022) who determined that his access was no longer amenable to percutaneous intervention.  Subsequently a tunneled dialysis catheter was placed and did ultimately require exchange 05/30/22.    Patient placed on IR schedule today for fistulagram.  He reports his HD catheter was removed in January by [facility].  He has since been able to resume HD via his LUE fistula.  Reports he last dialyzed 3/28, however it was noted that the flow was slow and clearance was poor.  He presents today requesting fistulagram with intervention. He has been NPO.  He has arranged transportation and post-procedure care with his sister and mother.  He is understanding of the goal to restore/ensure access and is agreeable to tunneled dialysis catheter placement if needed.   Past Medical History:  Diagnosis Date   Acute hypoxemic respiratory failure ([place]) 09/11/2016   Anemia    Chronic kidney disease    ARF on CRF Dialysis T/TH/Sa   Diabetes mellitus    Type II   End stage renal disease on dialysis ([place])    [place]    GERD (gastroesophageal reflux disease)    diet controlled   HLD (hyperlipidemia)    Hypertension    Hypothyroidism    Morbid obesity ([place])    Sacral wound    resolved   Thyroid disease     Past Surgical History:  Procedure Laterality Date   AMPUTATION Left 05/08/2021   Procedure: AMPUTATION GREAT TOE;  Surgeon: Newt Minion, MD;  Location: [place];   Service: Orthopedics;  Laterality: Left;   AV FISTULA PLACEMENT Left 09/26/2016   Procedure: LEFT UPPER ARM ARTERIOVENOUS (AV) FISTULA CREATION;  Surgeon: Elam Dutch, MD;  Location: [place];  Service: Vascular;  Laterality: Left;   AV FISTULA PLACEMENT Left 12/02/2016   Procedure: INSERTION OF ARTERIOVENOUS GORE-TEX GRAFT LEFT UPPER  ARM USING A 4-7MM BY 45CM GRAFT ;  Surgeon: Rosetta Posner, MD;  Location: [place];  Service: Vascular;  Laterality: Left;   AV FISTULA PLACEMENT Right 07/03/2020   Procedure: INSERTION OF ARTERIOVENOUS (AV) GORE-TEX GRAFT RIGHT UPPER ARM;  Surgeon: Marty Heck, MD;  Location: [place];  Service: Vascular;  Laterality: Right;   AV FISTULA PLACEMENT Left 07/02/2021   Procedure: LEFT UPPER EXTREMITY ARTERIOVENOUS FISTULA CREATION;  Surgeon: Waynetta Sandy, MD;  Location: [place];  Service: Vascular;  Laterality: Left;   [place] Right 05/06/2020   Procedure: FIRST STAGE RIGHT [place];  Surgeon: Serafina Mitchell, MD;  Location: [place];  Service: Vascular;  Laterality: Right;   [place] Left 08/20/2021   Procedure: LEFT ARM SECOND STAGE WITH ARTERIOVENOUS GRAFT;  Surgeon: Waynetta Sandy, MD;  Location: [place];  Service: Vascular;  Laterality: Left;   EXCHANGE OF A DIALYSIS CATHETER Right 10/08/2016   Procedure: EXCHANGE OF A DIALYSIS CATHETER-RIGHT INTERNAL JUGULAR;  Surgeon: Conrad [place], MD;  Location: [place];  Service: Vascular;  Laterality: Right;   INSERTION OF DIALYSIS CATHETER Right  09/26/2016   Procedure: INSERTION OF DIALYSIS CATHETER - Right Internal Jugular Placement;  Surgeon: Elam Dutch, MD;  Location: [place];  Service: Vascular;  Laterality: Right;   INSERTION OF DIALYSIS CATHETER Left 03/22/2018   Procedure: INSERTION OF DIALYSIS CATHETER;  Surgeon: Marty Heck, MD;  Location: [place];  Service: Vascular;  Laterality: Left;   INTRAOPERATIVE ARTERIOGRAM Left 05/04/2022    Procedure: INTRA OPERATIVE ARTERIOGRAM;  Surgeon: Broadus John, MD;  Location: [place];  Service: Vascular;  Laterality: Left;   IR DIALY SHUNT INTRO NEEDLE/INTRACATH INITIAL W/IMG LEFT Left 04/15/2022   IR FLUORO GUIDE CV LINE LEFT  04/19/2022   IR FLUORO GUIDE CV LINE LEFT  05/30/2022   IR FLUORO GUIDE CV LINE RIGHT  03/26/2021   IR FLUORO GUIDE CV LINE RIGHT  05/13/2021   IR FLUORO GUIDE CV LINE RIGHT  05/04/2022   IR REMOVAL TUN CV CATH W/O FL  05/11/2021   IR REMOVAL TUN CV CATH W/O FL  10/06/2021   IR THROMBECTOMY AV FISTULA W/THROMBOLYSIS/PTA INC/SHUNT/IMG LEFT Left 02/02/2022   IR THROMBECTOMY AV FISTULA W/THROMBOLYSIS/PTA INC/SHUNT/IMG LEFT Left 03/22/2022   IR THROMBECTOMY AV FISTULA W/THROMBOLYSIS/PTA INC/SHUNT/IMG LEFT Left 05/04/2022   IR THROMBECTOMY AV FISTULA W/THROMBOLYSIS/PTA/STENT INC/SHUNT/IMG LT Left 04/27/2022   IR US GUIDE VASC ACCESS LEFT  02/02/2022   IR US GUIDE VASC ACCESS LEFT  03/22/2022   IR US GUIDE VASC ACCESS LEFT  04/15/2022   IR US GUIDE VASC ACCESS LEFT  04/19/2022   IR US GUIDE VASC ACCESS LEFT  04/27/2022   IR US GUIDE VASC ACCESS RIGHT  03/26/2021   IR US GUIDE VASC ACCESS RIGHT  05/13/2021   IR US GUIDE VASC ACCESS RIGHT  05/04/2022   REVISON OF ARTERIOVENOUS FISTULA Left 03/22/2018   Procedure: LEFT UPPER EXTREMITY ARTERIOVENOUS  REVISION WITH GORE-TEX GRAFT.;  Surgeon: Marty Heck, MD;  Location: [place];  Service: Vascular;  Laterality: Left;   TEE WITHOUT CARDIOVERSION N/A 05/12/2021   Procedure: TRANSESOPHAGEAL ECHOCARDIOGRAM (TEE);  Surgeon: Werner Lean, MD;  Location: [facility] ENDOSCOPY;  Service: Cardiovascular;  Laterality: N/A;   THROMBECTOMY Left 08/26/2019   thrombectomy of LUA loop AVG   THROMBECTOMY AND REVISION OF ARTERIOVENTOUS (AV) GORETEX  GRAFT Right 07/27/2020   Procedure: INSERTION OF RIGHT ARM LOOP GRAFT WITH EXCISION OF RIGHT ARM BRACHIAL AXILLARY GRAFT;  Surgeon: Marty Heck, MD;  Location: [place];  Service:  Vascular;  Laterality: Right;   THROMBECTOMY BRACHIAL ARTERY Left 05/04/2022   Procedure: LEFT ARM RADIAL BRACHIAL ARTERY EMBOLECTOMY;  Surgeon: Broadus John, MD;  Location: [place];  Service: Vascular;  Laterality: Left;   UPPER EXTREMITY VENOGRAPHY Bilateral 06/14/2021   Procedure: UPPER EXTREMITY VENOGRAPHY;  Surgeon: Waynetta Sandy, MD;  Location: [place] [facility];  Service: Cardiovascular;  Laterality: Bilateral;   VISCERAL ANGIOGRAPHY Right 06/17/2020   Procedure: CENTRAL VENO;  Surgeon: Cherre Robins, MD;  Location: [place] [facility];  Service: Cardiovascular;  Laterality: Right;    Allergies: Hydralazine hcl  Medications: Prior to Admission medications   Medication Sig Start Date End Date Taking? Authorizing Provider  acetaminophen (TYLENOL) 500 MG tablet Take 1,000 mg by mouth every 6 (six) hours as needed (for pain.).   Yes [provider]  apixaban (ELIQUIS) 5 MG TABS tablet Take 1 tablet by mouth twice daily 08/04/22  Yes Dameron, Luna Fuse, DO  aspirin EC 81 MG tablet Take 1 tablet (81 mg total) by mouth daily. Swallow whole. 05/11/22  Yes Dameron, Luna Fuse, DO  calcium carbonate (TUMS - DOSED IN MG ELEMENTAL CALCIUM) 500 MG chewable tablet Chew 1,000 mg by mouth daily as needed for indigestion or heartburn.   Yes [provider]  cetirizine (ZYRTEC) 10 MG tablet Take 1 tablet (10 mg total) by mouth daily as needed for allergies. 05/11/22  Yes Dameron, Luna Fuse, DO  cinacalcet (SENSIPAR) 30 MG tablet Take 30 mg by mouth daily. 08/12/21  Yes [provider]  gabapentin (NEURONTIN) 100 MG capsule TAKE 1 CAPSULE BY MOUTH THREE TIMES DAILY 05/09/22  Yes Waynetta Sandy, MD  gabapentin (NEURONTIN) 300 MG capsule Take 1 capsule by mouth at bedtime 07/25/22  Yes Dameron, Luna Fuse, DO  HYDROcodone-acetaminophen (NORCO/VICODIN) 5-325 MG tablet Take 1 tablet by mouth every 6 (six) hours as needed for moderate pain. 08/12/22  Yes Dondra Prader R, NP   LANTUS 100 UNIT/ML injection Inject 0.05 mLs (5 Units total) into the skin 2 (two) times daily. 05/11/22  Yes Dameron, Luna Fuse, DO  levothyroxine (SYNTHROID) 150 MCG tablet Take 1 tablet (150 mcg total) by mouth daily before breakfast. 05/11/22  Yes Dameron, Luna Fuse, DO  midodrine (PROAMATINE) 5 MG tablet Take 1 tablet (5 mg total) by mouth Every Tuesday,Thursday,and Saturday with dialysis. 05/12/22  Yes Dameron, Luna Fuse, DO  multivitamin (RENA-VIT) TABS tablet Take 1 tablet by mouth at bedtime. 05/11/22  Yes Dameron, Luna Fuse, DO  rosuvastatin (CRESTOR) 20 MG tablet Take 1 tablet (20 mg total) by mouth daily. 05/11/22  Yes Dameron, Luna Fuse, DO  Accu-Chek Softclix Lancets lancets Use as instructed 06/11/21   Eppie Gibson, MD  Blood Glucose Monitoring Suppl (ACCU-CHEK GUIDE ME) w/Device KIT 1 kit by Does not apply route daily. 05/22/21   Eppie Gibson, MD  ferric citrate (AURYXIA) 1 GM 210 MG(Fe) tablet Take 2-4 tablets (420-840 mg total) by mouth See admin instructions. Take 4 tablets (840 mg) by mouth with each meal & take 2 tablets (420 mg) by mouth with each snack. 05/11/22   Dameron, Luna Fuse, DO  glucose blood (ACCU-CHEK GUIDE) test strip 1 each by Other route 2 (two) times daily. Use as instructed 09/22/21   Wells Guiles, DO  guaiFENesin (ROBITUSSIN) 100 MG/5ML liquid Take 5-10 mLs (100-200 mg total) by mouth every 4 (four) hours as needed for cough or to loosen phlegm. 07/30/22   Mickie Hillier, PA-C  Syringe, Disposable, (B-D SYRINGE SLIP TIP 30CC) 30 ML MISC 1 Syringe by Does not apply route daily. 01/31/19   Wilber Oliphant, MD     Family History  Problem Relation Age of Onset   Heart disease Mother    Hypertension Mother     Social History   Socioeconomic History   Marital status: Single    Spouse name: Not on file   Number of children: Not on file   Years of education: Not on file   Highest education level: Not on file  Occupational History   Not on file  Tobacco Use   Smoking  status: Never   Smokeless tobacco: Never  Vaping Use   Vaping Use: Never used  Substance and Sexual Activity   Alcohol use: No   Drug use: Yes    Frequency: 2.0 times per week    Types: Marijuana    Comment: Last use was on 08/17/21   Sexual activity: Yes  Other Topics Concern   Not on file  Social History Narrative   Not on file   Social Determinants of Health   Financial Resource Strain:  Not on file  Food Insecurity: No Food Insecurity (08/02/2022)   Hunger Vital Sign    Worried About Running Out of Food in the Last Year: Never true    Ran Out of Food in the Last Year: Never true  Transportation Needs: No Transportation Needs (08/02/2022)   PRAPARE - Hydrologist (Medical): No    Lack of Transportation (Non-Medical): No  Physical Activity: Insufficiently Active (07/07/2021)   Exercise Vital Sign    Days of Exercise per Week: 4 days    Minutes of Exercise per Session: 30 min  Stress: Stress Concern Present (08/06/2021)   [place]    Feeling of Stress : To some extent  Social Connections: Socially Isolated (06/07/2021)   Social Connection and Isolation Panel [NHANES]    Frequency of Communication with Friends and Family: More than three times a week    Frequency of Social Gatherings with Friends and Family: More than three times a week    Attends Religious Services: Never    Marine scientist or Organizations: No    Attends Archivist Meetings: Never    Marital Status: Never married     Review of Systems: A 12 point ROS discussed and pertinent positives are indicated in the HPI above.  All other systems are negative.  Review of Systems  Constitutional:  Negative for fatigue and fever.  Respiratory:  Negative for cough and shortness of breath.   Cardiovascular:  Negative for chest pain.  Gastrointestinal:  Negative for abdominal pain.  Musculoskeletal:  Negative  for back pain.  Psychiatric/Behavioral:  Negative for behavioral problems and confusion.     Vital Signs: BP 133/81   Pulse 99   Temp (!) 97.2 F (36.2 C) (Temporal)   Resp 17   Ht 6\' 2"  (1.88 m)   Wt (!) 330 lb (149.7 kg)   SpO2 100%   BMI 42.37 kg/m   Physical Exam Vitals and nursing note reviewed.  Constitutional:      General: He is not in acute distress.    Appearance: Normal appearance. He is not ill-appearing.  Cardiovascular:     Rate and Rhythm: Normal rate and regular rhythm.     Pulses: Normal pulses.  Pulmonary:     Effort: Pulmonary effort is normal. No respiratory distress.     Breath sounds: Normal breath sounds.  Neurological:     Mental Status: He is alert.      MD Evaluation Airway: WNL Heart: WNL Abdomen: WNL Chest/ Lungs: WNL ASA  Classification: 3 Mallampati/Airway Score: Two   Imaging: DG Chest 2 View  Result Date: 07/30/2022 CLINICAL DATA:  One-week history of shortness of breath and cough EXAM: CHEST - 2 VIEW COMPARISON:  Chest radiograph dated 04/11/2022 FINDINGS: Right diaphragmatic eventration. Radiodensity projects over the paraspinal lower lobes on lateral view. No focal consolidations. No pleural effusion or pneumothorax. The heart size and mediastinal contours are within normal limits. The visualized skeletal structures are unremarkable. Right axillary stent graft. IMPRESSION: 1. No active cardiopulmonary disease. 2. Radiodensity projects over the paraspinal lower lobes on lateral view, indeterminate but possibly superimposition of osseous structures. Consider chest CT for further evaluation, as clinically indicated. Electronically Signed   By: Darrin Nipper M.D.   On: 07/30/2022 12:57    Labs:  CBC: Recent Labs    04/19/22 0901 04/20/22 0608 05/04/22 1450 05/05/22 0508 05/11/22 1106  WBC 7.8 8.4  --  9.2 10.7  HGB 13.2 13.5 14.6 13.0 13.2  HCT 42.0 42.1 43.0 39.1 39.2  PLT 247 251  --  210 305    COAGS: No results for  input(s): "INR", "APTT" in the last 8760 hours.  BMP: Recent Labs    04/19/22 0901 04/20/22 0608 04/27/22 1251 05/04/22 1450 05/05/22 0508  NA 140 136 138 140 135  K 6.5* 4.4 4.2 4.4 5.3*  CL 100 94* 98 102 98  CO2 16* 23 24  --  19*  GLUCOSE 102* 128* 107* 102* 164*  BUN 91* 52* 43* 43* 58*  CALCIUM 8.3* 8.4* 8.8*  --  8.5*  CREATININE 27.99* 17.74* 14.16* 14.80* 14.80*  GFRNONAA 2* 3* 4*  --  4*    LIVER FUNCTION TESTS: Recent Labs    04/18/22 1014 04/19/22 0901 04/20/22 0608 05/05/22 0508  BILITOT 0.7 0.7  --   --   AST 12* 15  --   --   ALT 13 13  --   --   ALKPHOS 51 51  --   --   PROT 7.6 7.4  --   --   ALBUMIN 4.1 3.9 4.2 3.7    TUMOR MARKERS: No results for input(s): "AFPTM", "CEA", "CA199", "CHROMGRNA" in the last 8760 hours.  Assessment and Plan: Patient with past medical history of HTN, ESRD on HD presents with complaint of difficulty with LUE brachial basilic graft.  IR consulted for fistulagram at the request of Dr. Joylene Grapes. Case reviewed by Dr. Vernard Gambles who approves patient for procedure.  Patient presents today in their usual state of health.  He has been NPO and is currently on Eliquis.   Risks and benefits discussed with the patient including, but not limited to bleeding, infection, vascular injury, pulmonary embolism, need for tunneled HD catheter placement or even death.  All of the patient's questions were answered, patient is agreeable to proceed. Consent signed and in chart.   Thank you for this interesting consult.  I greatly enjoyed meeting Mathias Cicero and look forward to participating in their care.  A copy of this report was sent to the requesting provider on this date.  Electronically Signed: Docia Barrier, PA 08/19/2022, 2:05 PM   I spent a total of    25 Minutes in face to face in clinical consultation, greater than 50% of which was counseling/coordinating care for malfunctioning dialysis access.

## 2022-08-19 NOTE — Progress Notes (Signed)
Patient was given discharge instructions. He verbalized understanding. 

## 2022-08-21 DIAGNOSIS — E1129 Type 2 diabetes mellitus with other diabetic kidney complication: Secondary | ICD-10-CM | POA: Diagnosis not present

## 2022-08-21 DIAGNOSIS — N186 End stage renal disease: Secondary | ICD-10-CM | POA: Diagnosis not present

## 2022-08-21 DIAGNOSIS — Z992 Dependence on renal dialysis: Secondary | ICD-10-CM | POA: Diagnosis not present

## 2022-08-28 ENCOUNTER — Other Ambulatory Visit: Payer: Self-pay | Admitting: Student

## 2022-08-28 DIAGNOSIS — E039 Hypothyroidism, unspecified: Secondary | ICD-10-CM

## 2022-09-02 ENCOUNTER — Ambulatory Visit: Payer: Medicaid Other | Admitting: Family

## 2022-09-13 ENCOUNTER — Ambulatory Visit: Payer: Medicaid Other | Admitting: Family

## 2022-09-14 ENCOUNTER — Ambulatory Visit (INDEPENDENT_AMBULATORY_CARE_PROVIDER_SITE_OTHER): Payer: Medicaid Other | Admitting: Family

## 2022-09-14 ENCOUNTER — Encounter: Payer: Self-pay | Admitting: Family

## 2022-09-14 DIAGNOSIS — L97521 Non-pressure chronic ulcer of other part of left foot limited to breakdown of skin: Secondary | ICD-10-CM | POA: Diagnosis not present

## 2022-09-14 DIAGNOSIS — Z89432 Acquired absence of left foot: Secondary | ICD-10-CM

## 2022-09-14 NOTE — Progress Notes (Signed)
Office Visit Note   Patient: Paul [name]           Date of Birth: 12-02-1990           MRN: [id number] Visit Date: 09/14/2022              Requested by: Darral Dash, DO [address] 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  Has had return of his callus tissue.  There is no associated pain.  He states his doughnut is worn out and broken down.  Assessment & Plan: Visit Diagnoses: No diagnosis found.  Plan: Debrided callus and ulcer of nonviable tissue.  Given new felt pressure relieving donuts for his shoe wear x 2.  He will pack the ulcer open with silver cell and cover with a gauze dressing  Follow-Up Instructions: Return in about 3 weeks (around 10/05/2022).   Ortho Exam  Patient is alert, oriented, no adenopathy, well-dressed, normal affect, normal respiratory effort. On examination of the left foot the great toe is surgically absent.  There is callused ulceration to the second metatarsal head with hyperkeratotic tissue buildup this was debrided nonviable tissue back to viable tissue there is underlying ulcer which is 2 cm in diameter centrally is 1 cm deep.  There is no exposed bone or tendon no active drainage no surrounding erythema there is some surrounding maceration  Imaging: No results found. No images are attached to the encounter.  Labs: Lab Results  Component Value Date   HGBA1C 6.3 (H) 04/18/2022   HGBA1C 6.2 09/22/2021   HGBA1C 6.0 (H) 05/07/2021   REPTSTATUS 05/13/2021 FINAL 05/08/2021   GRAMSTAIN  05/08/2021    FEW WBC PRESENT, PREDOMINANTLY PMN FEW GRAM POSITIVE COCCI    CULT  05/08/2021    MODERATE STAPHYLOCOCCUS AUREUS NO ANAEROBES ISOLATED Performed at [facility]  Lab, [address] [place], [place] [address]    [facility] STAPHYLOCOCCUS AUREUS 05/08/2021     Lab Results  Component Value Date   ALBUMIN 3.7 05/05/2022   ALBUMIN 4.2 04/20/2022   ALBUMIN 3.9 04/19/2022    Lab Results  Component Value Date   MG 2.8 (H) 09/26/2016   MG 2.8 (H) 09/25/2016   MG 2.8 (H) 09/24/2016   Lab Results  Component Value Date   VD25OH 6.8 (L) 09/14/2016    No results found for: "PREALBUMIN"    Latest Ref Rng & Units 05/11/2022   11:06 AM 05/05/2022    5:08 AM 05/04/2022    2:50 PM  CBC EXTENDED  WBC 3.4 - 10.8 x10E3/uL 10.7  9.2    RBC 4.14 - 5.80 x10E6/uL 4.93  4.81    Hemoglobin 13.0 - 17.7 g/dL 41.3  24.4  01.0   HCT 37.5 - 51.0 % 39.2  39.1  43.0   Platelets 150 - 450 x10E3/uL 305  210       There is no height or weight on file to calculate BMI.  Orders:  No orders of the defined types were placed in this encounter.  No orders of the defined types were placed in this encounter.    Procedures: No procedures performed  Clinical Data: No additional findings.  ROS:  All other systems  negative, except as noted in the HPI. Review of Systems  Objective: Vital Signs: There were no vitals taken for this visit.  Specialty Comments:  No specialty comments available.  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   Thrombus 05/04/2022   Ischemia of left upper extremity 05/04/2022   Complication of vascular access for dialysis 04/19/2022   ESRD (end stage renal disease) 04/19/2022   Failure of hemodialysis access 04/18/2022   Left hand pain 09/22/2021   Diabetic foot infection    Wound infection 05/07/2021   MSSA bacteremia 05/07/2021   Osteomyelitis 05/07/2021   Abscess 05/07/2021   Sepsis due to cellulitis 05/06/2021   Poor compliance with medication 03/17/2021   Fluid overload, unspecified 07/09/2020   Complication of vascular dialysis catheter 03/23/2020    Other disorders of phosphorus metabolism 01/28/2020   Hypertension 01/08/2020   Hypocalcemia 10/29/2019   ESRD on dialysis 11/16/2017   Pruritus, unspecified 04/04/2017   Unspecified protein-calorie malnutrition 11/11/2016   Headache, unspecified 10/29/2016   Encounter for immunization 10/19/2016   Iron deficiency anemia, unspecified 10/10/2016   Anemia in chronic kidney disease 09/30/2016   Coagulation defect, unspecified 09/30/2016   Pneumonia due to Pseudomonas 09/30/2016   Secondary hyperparathyroidism of renal origin 09/30/2016   Hypothyroidism 10/29/2009   Type 2 diabetes mellitus 04/14/2009   Hyperlipidemia 04/14/2009   OBESITY, MORBID 04/14/2009   Essential hypertension, benign 04/14/2009   Past Medical History:  Diagnosis Date   Acute hypoxemic respiratory failure 09/11/2016   Anemia    Chronic kidney disease    ARF on CRF Dialysis T/TH/Sa   Diabetes mellitus    Type II   End stage renal disease on dialysis    [place]    GERD (gastroesophageal reflux disease)    diet controlled   HLD (hyperlipidemia)    Hypertension    Hypothyroidism    Morbid obesity    Sacral wound    resolved   Thyroid disease     Family History  Problem Relation Age of Onset   Heart disease Mother    Hypertension Mother     Past Surgical History:  Procedure Laterality Date   AMPUTATION Left 05/08/2021   Procedure: AMPUTATION GREAT TOE;  Surgeon: Nadara Mustard, MD;  Location: [facility] OR;  Service: Orthopedics;  Laterality: Left;   AV FISTULA PLACEMENT Left 09/26/2016   Procedure: LEFT UPPER ARM ARTERIOVENOUS (AV) FISTULA CREATION;  Surgeon: Sherren Kerns, MD;  Location: [facility] OR;  Service: Vascular;  Laterality: Left;   AV FISTULA PLACEMENT Left 12/02/2016   Procedure: INSERTION OF ARTERIOVENOUS GORE-TEX GRAFT LEFT UPPER  ARM USING A 4-7MM BY 45CM GRAFT ;  Surgeon: Larina Earthly, MD;  Location: [facility] OR;  Service: Vascular;  Laterality: Left;   AV FISTULA PLACEMENT Right 07/03/2020    Procedure: INSERTION OF ARTERIOVENOUS (AV) GORE-TEX GRAFT RIGHT UPPER ARM;  Surgeon: Cephus Shelling, MD;  Location: [facility] OR;  Service: Vascular;  Laterality: Right;   AV FISTULA PLACEMENT Left 07/02/2021   Procedure: LEFT UPPER EXTREMITY ARTERIOVENOUS FISTULA CREATION;  Surgeon: Maeola Harman, MD;  Location: [facility] OR;  Service: Vascular;  Laterality: Left;   BASCILIC VEIN TRANSPOSITION Right 05/06/2020   Procedure: FIRST STAGE RIGHT BASCILIC VEIN TRANSPOSITION;  Surgeon: Nada Libman, MD;  Location: [facility] OR;  Service: Vascular;  Laterality: Right;   BASCILIC VEIN TRANSPOSITION Left 08/20/2021   Procedure: LEFT ARM SECOND STAGE WITH ARTERIOVENOUS GRAFT;  Surgeon: Maeola Harman, MD;  Location: [facility] OR;  Service: Vascular;  Laterality: Left;   EXCHANGE OF A DIALYSIS CATHETER Right 10/08/2016   Procedure: EXCHANGE OF A DIALYSIS CATHETER-RIGHT INTERNAL JUGULAR;  Surgeon: Fransisco Hertz, MD;  Location: [facility] OR;  Service: Vascular;  Laterality: Right;   INSERTION OF DIALYSIS CATHETER Right 09/26/2016   Procedure: INSERTION OF DIALYSIS CATHETER - Right Internal Jugular Placement;  Surgeon: Sherren Kerns, MD;  Location: [facility] OR;  Service: Vascular;  Laterality: Right;   INSERTION OF DIALYSIS CATHETER Left 03/22/2018   Procedure: INSERTION OF DIALYSIS CATHETER;  Surgeon: Cephus Shelling, MD;  Location: [facility] OR;  Service: Vascular;  Laterality: Left;   INTRAOPERATIVE ARTERIOGRAM Left 05/04/2022   Procedure: INTRA OPERATIVE ARTERIOGRAM;  Surgeon: Victorino Sparrow, MD;  Location: [facility] OR;  Service: Vascular;  Laterality: Left;   IR AV DIALY SHUNT INTRO NEEDLE/INTRACATH INITIAL W/PTA/IMG LEFT  08/19/2022   IR DIALY SHUNT INTRO NEEDLE/INTRACATH INITIAL W/IMG LEFT Left 04/15/2022   IR FLUORO GUIDE CV LINE LEFT  04/19/2022   IR FLUORO GUIDE CV LINE LEFT  05/30/2022   IR FLUORO GUIDE CV LINE RIGHT  03/26/2021   IR FLUORO GUIDE CV LINE RIGHT  05/13/2021   IR FLUORO GUIDE CV LINE RIGHT  05/04/2022    IR REMOVAL TUN CV CATH W/O FL  05/11/2021   IR REMOVAL TUN CV CATH W/O FL  10/06/2021   IR THROMBECTOMY AV FISTULA W/THROMBOLYSIS/PTA INC/SHUNT/IMG LEFT Left 02/02/2022   IR THROMBECTOMY AV FISTULA W/THROMBOLYSIS/PTA INC/SHUNT/IMG LEFT Left 03/22/2022   IR THROMBECTOMY AV FISTULA W/THROMBOLYSIS/PTA INC/SHUNT/IMG LEFT Left 05/04/2022   IR THROMBECTOMY AV FISTULA W/THROMBOLYSIS/PTA/STENT INC/SHUNT/IMG LT Left 04/27/2022   IR US GUIDE VASC ACCESS LEFT  02/02/2022   IR US GUIDE VASC ACCESS LEFT  03/22/2022   IR US GUIDE VASC ACCESS LEFT  04/15/2022   IR US GUIDE VASC ACCESS LEFT  04/19/2022   IR US GUIDE VASC ACCESS LEFT  04/27/2022   IR US GUIDE VASC ACCESS RIGHT  03/26/2021   IR US GUIDE VASC ACCESS RIGHT  05/13/2021   IR US GUIDE VASC ACCESS RIGHT  05/04/2022   REVISON OF ARTERIOVENOUS FISTULA Left 03/22/2018   Procedure: LEFT UPPER EXTREMITY ARTERIOVENOUS  REVISION WITH GORE-TEX GRAFT.;  Surgeon: Cephus Shelling, MD;  Location: [facility] OR;  Service: Vascular;  Laterality: Left;   TEE WITHOUT CARDIOVERSION N/A 05/12/2021   Procedure: TRANSESOPHAGEAL ECHOCARDIOGRAM (TEE);  Surgeon: Christell Constant, MD;  Location: [facility] ENDOSCOPY;  Service: Cardiovascular;  Laterality: N/A;   THROMBECTOMY Left 08/26/2019   thrombectomy of LUA loop AVG   THROMBECTOMY AND REVISION OF ARTERIOVENTOUS (AV) GORETEX  GRAFT Right 07/27/2020   Procedure: INSERTION OF RIGHT ARM LOOP GRAFT WITH EXCISION OF RIGHT ARM BRACHIAL AXILLARY GRAFT;  Surgeon: Cephus Shelling, MD;  Location: [facility] OR;  Service: Vascular;  Laterality: Right;   THROMBECTOMY BRACHIAL ARTERY Left 05/04/2022   Procedure: LEFT ARM RADIAL BRACHIAL ARTERY EMBOLECTOMY;  Surgeon: Victorino Sparrow, MD;  Location: [facility] OR;  Service: Vascular;  Laterality: Left;   UPPER EXTREMITY VENOGRAPHY Bilateral 06/14/2021   Procedure: UPPER EXTREMITY VENOGRAPHY;  Surgeon: Maeola Harman, MD;  Location: [facility] INVASIVE CV LAB;  Service: Cardiovascular;  Laterality:  Bilateral;   VISCERAL ANGIOGRAPHY Right 06/17/2020   Procedure: CENTRAL VENO;  Surgeon: Leonie Douglas, MD;  Location: [facility] INVASIVE CV LAB;  Service: Cardiovascular;  Laterality: Right;   Social History   Occupational History   Not on file  Tobacco Use  Smoking status: Never   Smokeless tobacco: Never  Vaping Use   Vaping Use: Never used  Substance and Sexual Activity   Alcohol use: No   Drug use: Yes    Frequency: 2.0 times per week    Types: Marijuana    Comment: Last use was on 08/17/21   Sexual activity: Yes

## 2022-09-20 DIAGNOSIS — Z992 Dependence on renal dialysis: Secondary | ICD-10-CM | POA: Diagnosis not present

## 2022-09-20 DIAGNOSIS — N186 End stage renal disease: Secondary | ICD-10-CM | POA: Diagnosis not present

## 2022-09-20 DIAGNOSIS — E1129 Type 2 diabetes mellitus with other diabetic kidney complication: Secondary | ICD-10-CM | POA: Diagnosis not present

## 2022-09-23 ENCOUNTER — Telehealth: Payer: Self-pay | Admitting: Family

## 2022-09-23 MED ORDER — HYDROCODONE-ACETAMINOPHEN 5-325 MG PO TABS: 1.0000 | ORAL_TABLET | Freq: Three times a day (TID) | ORAL | 0 refills | Status: DC | PRN

## 2022-09-23 NOTE — Telephone Encounter (Signed)
Patient called. He would like a refill on hydrocodone called in. His cb# [phone]

## 2022-09-26 ENCOUNTER — Other Ambulatory Visit: Payer: Self-pay | Admitting: Student

## 2022-09-26 DIAGNOSIS — E039 Hypothyroidism, unspecified: Secondary | ICD-10-CM

## 2022-09-29 ENCOUNTER — Emergency Department (HOSPITAL_COMMUNITY): Payer: Medicaid Other

## 2022-09-29 ENCOUNTER — Inpatient Hospital Stay (HOSPITAL_COMMUNITY)
Admission: EM | Admit: 2022-09-29 | Discharge: 2022-10-02 | DRG: 853 | Disposition: A | Discharge status: Home Health | Payer: Medicaid Other | Attending: Family Medicine | Admitting: Family Medicine

## 2022-09-29 DIAGNOSIS — E11628 Type 2 diabetes mellitus with other skin complications: Secondary | ICD-10-CM | POA: Diagnosis not present

## 2022-09-29 DIAGNOSIS — Z992 Dependence on renal dialysis: Secondary | ICD-10-CM

## 2022-09-29 DIAGNOSIS — M86172 Other acute osteomyelitis, left ankle and foot: Secondary | ICD-10-CM | POA: Insufficient documentation

## 2022-09-29 DIAGNOSIS — R6520 Severe sepsis without septic shock: Secondary | ICD-10-CM | POA: Diagnosis not present

## 2022-09-29 DIAGNOSIS — L97529 Non-pressure chronic ulcer of other part of left foot with unspecified severity: Secondary | ICD-10-CM | POA: Diagnosis not present

## 2022-09-29 DIAGNOSIS — A419 Sepsis, unspecified organism: Principal | ICD-10-CM | POA: Diagnosis present

## 2022-09-29 DIAGNOSIS — L02612 Cutaneous abscess of left foot: Secondary | ICD-10-CM

## 2022-09-29 DIAGNOSIS — Z6841 Body Mass Index (BMI) 40.0 and over, adult: Secondary | ICD-10-CM

## 2022-09-29 DIAGNOSIS — E1122 Type 2 diabetes mellitus with diabetic chronic kidney disease: Secondary | ICD-10-CM | POA: Diagnosis not present

## 2022-09-29 DIAGNOSIS — Z888 Allergy status to other drugs, medicaments and biological substances status: Secondary | ICD-10-CM

## 2022-09-29 DIAGNOSIS — M87875 Other osteonecrosis, left foot: Secondary | ICD-10-CM | POA: Diagnosis present

## 2022-09-29 DIAGNOSIS — Z79899 Other long term (current) drug therapy: Secondary | ICD-10-CM

## 2022-09-29 DIAGNOSIS — R000 Tachycardia, unspecified: Secondary | ICD-10-CM | POA: Diagnosis not present

## 2022-09-29 DIAGNOSIS — I120 Hypertensive chronic kidney disease with stage 5 chronic kidney disease or end stage renal disease: Secondary | ICD-10-CM | POA: Diagnosis present

## 2022-09-29 DIAGNOSIS — E11621 Type 2 diabetes mellitus with foot ulcer: Secondary | ICD-10-CM | POA: Diagnosis present

## 2022-09-29 DIAGNOSIS — Z79890 Hormone replacement therapy (postmenopausal): Secondary | ICD-10-CM

## 2022-09-29 DIAGNOSIS — Z8249 Family history of ischemic heart disease and other diseases of the circulatory system: Secondary | ICD-10-CM

## 2022-09-29 DIAGNOSIS — K219 Gastro-esophageal reflux disease without esophagitis: Secondary | ICD-10-CM | POA: Diagnosis present

## 2022-09-29 DIAGNOSIS — N2581 Secondary hyperparathyroidism of renal origin: Secondary | ICD-10-CM | POA: Diagnosis present

## 2022-09-29 DIAGNOSIS — E039 Hypothyroidism, unspecified: Secondary | ICD-10-CM | POA: Diagnosis present

## 2022-09-29 DIAGNOSIS — M898X9 Other specified disorders of bone, unspecified site: Secondary | ICD-10-CM | POA: Diagnosis present

## 2022-09-29 DIAGNOSIS — E119 Type 2 diabetes mellitus without complications: Secondary | ICD-10-CM

## 2022-09-29 DIAGNOSIS — L089 Local infection of the skin and subcutaneous tissue, unspecified: Secondary | ICD-10-CM | POA: Diagnosis not present

## 2022-09-29 DIAGNOSIS — E872 Acidosis, unspecified: Secondary | ICD-10-CM | POA: Diagnosis present

## 2022-09-29 DIAGNOSIS — N186 End stage renal disease: Secondary | ICD-10-CM

## 2022-09-29 DIAGNOSIS — Z7901 Long term (current) use of anticoagulants: Secondary | ICD-10-CM

## 2022-09-29 DIAGNOSIS — Z794 Long term (current) use of insulin: Secondary | ICD-10-CM | POA: Diagnosis not present

## 2022-09-29 DIAGNOSIS — E1169 Type 2 diabetes mellitus with other specified complication: Secondary | ICD-10-CM | POA: Diagnosis present

## 2022-09-29 DIAGNOSIS — E785 Hyperlipidemia, unspecified: Secondary | ICD-10-CM | POA: Diagnosis present

## 2022-09-29 DIAGNOSIS — Z7982 Long term (current) use of aspirin: Secondary | ICD-10-CM

## 2022-09-29 DIAGNOSIS — Z833 Family history of diabetes mellitus: Secondary | ICD-10-CM

## 2022-09-29 LAB — COMPREHENSIVE METABOLIC PANEL
ALT: 23 U/L (ref 0–44)
AST: 34 U/L (ref 15–41)
Albumin: 4.4 g/dL (ref 3.5–5.0)
Alkaline Phosphatase: 72 U/L (ref 38–126)
Anion gap: 20 — ABNORMAL HIGH (ref 5–15)
BUN: 28 mg/dL — ABNORMAL HIGH (ref 6–20)
CO2: 20 mmol/L — ABNORMAL LOW (ref 22–32)
Calcium: 9.6 mg/dL (ref 8.9–10.3)
Chloride: 93 mmol/L — ABNORMAL LOW (ref 98–111)
Creatinine, Ser: 11.31 mg/dL — ABNORMAL HIGH (ref 0.61–1.24)
GFR, Estimated: 6 mL/min — ABNORMAL LOW (ref >60)
Glucose, Bld: 159 mg/dL — ABNORMAL HIGH (ref 70–99)
Potassium: 3.8 mmol/L (ref 3.5–5.1)
Sodium: 133 mmol/L — ABNORMAL LOW (ref 135–145)
Total Bilirubin: 0.8 mg/dL (ref 0.3–1.2)
Total Protein: 9.2 g/dL — ABNORMAL HIGH (ref 6.5–8.1)

## 2022-09-29 LAB — CBC
HCT: 54.8 % — ABNORMAL HIGH (ref 39.0–52.0)
Hemoglobin: 16.6 g/dL (ref 13.0–17.0)
MCH: 26.9 pg (ref 26.0–34.0)
MCHC: 30.3 g/dL (ref 30.0–36.0)
MCV: 88.7 fL (ref 80.0–100.0)
Platelets: 219 10*3/uL (ref 150–400)
RBC: 6.18 MIL/uL — ABNORMAL HIGH (ref 4.22–5.81)
RDW: 19.8 % — ABNORMAL HIGH (ref 11.5–15.5)
WBC: 8.8 10*3/uL (ref 4.0–10.5)
nRBC: 0 % (ref 0.0–0.2)

## 2022-09-29 LAB — HIV ANTIBODY (ROUTINE TESTING W REFLEX): HIV Screen 4th Generation wRfx: NONREACTIVE

## 2022-09-29 LAB — PROTIME-INR
INR: 1.8 — ABNORMAL HIGH (ref 0.8–1.2)
Prothrombin Time: 21.2 seconds — ABNORMAL HIGH (ref 11.4–15.2)

## 2022-09-29 LAB — LACTIC ACID, PLASMA
Lactic Acid, Venous: 4.2 mmol/L (ref 0.5–1.9)
Lactic Acid, Venous: 3.4 mmol/L (ref 0.5–1.9)
Lactic Acid, Venous: 1.3 mmol/L (ref 0.5–1.9)

## 2022-09-29 LAB — GLUCOSE, CAPILLARY
Glucose-Capillary: 130 mg/dL — ABNORMAL HIGH (ref 70–99)
Glucose-Capillary: 188 mg/dL — ABNORMAL HIGH (ref 70–99)

## 2022-09-29 LAB — APTT: aPTT: 37 seconds — ABNORMAL HIGH (ref 24–36)

## 2022-09-29 LAB — LIPASE, BLOOD: Lipase: 47 U/L (ref 11–51)

## 2022-09-29 MED ORDER — ACETAMINOPHEN 650 MG RE SUPP: 650.0000 mg | Freq: Four times a day (QID) | RECTAL | Status: DC | PRN

## 2022-09-29 MED ORDER — LACTATED RINGERS IV BOLUS (SEPSIS)
250.0000 mL | Freq: Once | INTRAVENOUS | Status: AC
  Administered 2022-09-29: 250 mL via INTRAVENOUS

## 2022-09-29 MED ORDER — CINACALCET HCL 30 MG PO TABS
30.0000 mg | ORAL_TABLET | Freq: Every day | ORAL | Status: DC
  Administered 2022-09-30 – 2022-10-02 (×3): 30 mg via ORAL
  Filled 2022-09-29 (×3): qty 1

## 2022-09-29 MED ORDER — APIXABAN 5 MG PO TABS
5.0000 mg | ORAL_TABLET | Freq: Two times a day (BID) | ORAL | Status: DC
  Administered 2022-09-29 – 2022-09-30 (×2): 5 mg via ORAL
  Filled 2022-09-29 (×2): qty 1

## 2022-09-29 MED ORDER — VANCOMYCIN HCL 1500 MG/300ML IV SOLN
1500.0000 mg | INTRAVENOUS | Status: DC
  Administered 2022-10-01: 1500 mg via INTRAVENOUS
  Filled 2022-09-29: qty 300

## 2022-09-29 MED ORDER — CALCIUM CARBONATE ANTACID 500 MG PO CHEW: 1000.0000 mg | CHEWABLE_TABLET | Freq: Every day | ORAL | Status: DC | PRN

## 2022-09-29 MED ORDER — ROSUVASTATIN CALCIUM 20 MG PO TABS
20.0000 mg | ORAL_TABLET | Freq: Every day | ORAL | Status: DC
  Administered 2022-09-30 – 2022-10-02 (×3): 20 mg via ORAL
  Filled 2022-09-29 (×3): qty 1

## 2022-09-29 MED ORDER — FERRIC CITRATE 1 GM 210 MG(FE) PO TABS
840.0000 mg | ORAL_TABLET | Freq: Three times a day (TID) | ORAL | Status: DC
  Administered 2022-09-29 – 2022-10-02 (×6): 840 mg via ORAL
  Filled 2022-09-29 (×6): qty 4

## 2022-09-29 MED ORDER — ACETAMINOPHEN 500 MG PO TABS
1000.0000 mg | ORAL_TABLET | Freq: Four times a day (QID) | ORAL | Status: DC | PRN
  Administered 2022-09-29 – 2022-09-30 (×3): 1000 mg via ORAL
  Filled 2022-09-29 (×3): qty 2

## 2022-09-29 MED ORDER — ONDANSETRON HCL 4 MG/2ML IJ SOLN: 4.0000 mg | Freq: Four times a day (QID) | INTRAMUSCULAR | Status: DC | PRN

## 2022-09-29 MED ORDER — GABAPENTIN 100 MG PO CAPS
100.0000 mg | ORAL_CAPSULE | Freq: Three times a day (TID) | ORAL | Status: DC
  Administered 2022-09-29 – 2022-10-02 (×8): 100 mg via ORAL
  Filled 2022-09-29 (×8): qty 1

## 2022-09-29 MED ORDER — METRONIDAZOLE 500 MG/100ML IV SOLN
500.0000 mg | Freq: Two times a day (BID) | INTRAVENOUS | Status: AC
  Administered 2022-09-29 – 2022-09-30 (×2): 500 mg via INTRAVENOUS
  Filled 2022-09-29 (×2): qty 100

## 2022-09-29 MED ORDER — VANCOMYCIN HCL IN DEXTROSE 1-5 GM/200ML-% IV SOLN
1000.0000 mg | Freq: Once | INTRAVENOUS | Status: DC
  Filled 2022-09-29: qty 200

## 2022-09-29 MED ORDER — VANCOMYCIN HCL IN DEXTROSE 1-5 GM/200ML-% IV SOLN
1000.0000 mg | Freq: Once | INTRAVENOUS | Status: AC
  Administered 2022-09-29: 1000 mg via INTRAVENOUS

## 2022-09-29 MED ORDER — GABAPENTIN 300 MG PO CAPS
300.0000 mg | ORAL_CAPSULE | Freq: Every day | ORAL | Status: DC
  Administered 2022-09-29 – 2022-10-01 (×3): 300 mg via ORAL
  Filled 2022-09-29 (×3): qty 1

## 2022-09-29 MED ORDER — SODIUM CHLORIDE 0.9 % IV SOLN
2.0000 g | Freq: Once | INTRAVENOUS | Status: AC
  Administered 2022-09-29: 2 g via INTRAVENOUS
  Filled 2022-09-29: qty 20

## 2022-09-29 MED ORDER — MIDODRINE HCL 5 MG PO TABS
5.0000 mg | ORAL_TABLET | ORAL | Status: DC
  Administered 2022-10-01: 5 mg via ORAL
  Filled 2022-09-29 (×2): qty 1

## 2022-09-29 MED ORDER — VANCOMYCIN HCL 1500 MG/300ML IV SOLN
1500.0000 mg | Freq: Once | INTRAVENOUS | Status: AC
  Administered 2022-09-29: 1500 mg via INTRAVENOUS
  Filled 2022-09-29: qty 300

## 2022-09-29 MED ORDER — LEVOTHYROXINE SODIUM 75 MCG PO TABS
150.0000 ug | ORAL_TABLET | Freq: Every day | ORAL | Status: DC
  Administered 2022-10-01 – 2022-10-02 (×2): 150 ug via ORAL
  Filled 2022-09-29 (×2): qty 2

## 2022-09-29 MED ORDER — RENA-VITE PO TABS
1.0000 | ORAL_TABLET | Freq: Every day | ORAL | Status: DC
  Administered 2022-09-29 – 2022-10-01 (×3): 1 via ORAL
  Filled 2022-09-29 (×3): qty 1

## 2022-09-29 MED ORDER — LACTATED RINGERS IV SOLN: INTRAVENOUS | Status: DC

## 2022-09-29 MED ORDER — INSULIN ASPART 100 UNIT/ML IJ SOLN
0.0000 [IU] | Freq: Three times a day (TID) | INTRAMUSCULAR | Status: DC
  Administered 2022-09-30 (×2): 1 [IU] via SUBCUTANEOUS
  Administered 2022-10-01: 2 [IU] via SUBCUTANEOUS
  Administered 2022-10-01 – 2022-10-02 (×3): 1 [IU] via SUBCUTANEOUS

## 2022-09-29 MED ORDER — ONDANSETRON HCL 4 MG/2ML IJ SOLN
4.0000 mg | Freq: Once | INTRAMUSCULAR | Status: AC
  Administered 2022-09-29: 4 mg via INTRAVENOUS
  Filled 2022-09-29: qty 2

## 2022-09-29 MED ORDER — ONDANSETRON 4 MG PO TBDP: 4.0000 mg | ORAL_TABLET | Freq: Once | ORAL | Status: DC | PRN

## 2022-09-29 MED ORDER — ACETAMINOPHEN 325 MG PO TABS: 650.0000 mg | ORAL_TABLET | Freq: Four times a day (QID) | ORAL | Status: DC | PRN

## 2022-09-29 MED ORDER — ONDANSETRON HCL 4 MG PO TABS: 4.0000 mg | ORAL_TABLET | Freq: Four times a day (QID) | ORAL | Status: DC | PRN

## 2022-09-29 NOTE — ED Provider Notes (Signed)
[place] EMERGENCY DEPARTMENT AT [facility] Provider Note   CSN: [id number] Arrival date & time: 09/29/22  1156     History  Chief Complaint  Patient presents with   Nausea   Chills    Paul [name] is a 32 y.o. male.  HPI Has complex medical history including diabetes and ESRD on dialysis.  Patient was dialyzed today.  He made it through most of his session but was feeling weak and unwell so discontinued session early and came to the emergency department.  The patient reports for couple of days he has been really fatigued and having fevers.  He reports he does have a wound on his foot that is being cleaned and managed but he has noted some foul-smelling drainage.  He does not perceive pain.    Home Medications Prior to Admission medications   Medication Sig Start Date End Date Taking? Authorizing Provider  Accu-Chek Softclix Lancets lancets Use as instructed 06/11/21   Alicia Amel, MD  acetaminophen (TYLENOL) 500 MG tablet Take 1,000 mg by mouth every 6 (six) hours as needed (for pain.).    [provider]  apixaban (ELIQUIS) 5 MG TABS tablet Take 1 tablet by mouth twice daily 08/04/22   Dameron, Nolberto Hanlon, DO  aspirin EC 81 MG tablet Take 1 tablet (81 mg total) by mouth daily. Swallow whole. 05/11/22   Dameron, Nolberto Hanlon, DO  Blood Glucose Monitoring Suppl (ACCU-CHEK GUIDE ME) w/Device KIT 1 kit by Does not apply route daily. 05/22/21   Alicia Amel, MD  calcium carbonate (TUMS - DOSED IN MG ELEMENTAL CALCIUM) 500 MG chewable tablet Chew 1,000 mg by mouth daily as needed for indigestion or heartburn.    [provider]  cetirizine (ZYRTEC) 10 MG tablet Take 1 tablet (10 mg total) by mouth daily as needed for allergies. 05/11/22   Dameron, Nolberto Hanlon, DO  cinacalcet (SENSIPAR) 30 MG tablet Take 30 mg by mouth daily. 08/12/21   [provider]  ferric citrate (AURYXIA) 1 GM 210 MG(Fe) tablet Take 2-4 tablets (420-840 mg total) by mouth See admin  instructions. Take 4 tablets (840 mg) by mouth with each meal & take 2 tablets (420 mg) by mouth with each snack. 05/11/22   Dameron, Nolberto Hanlon, DO  gabapentin (NEURONTIN) 100 MG capsule TAKE 1 CAPSULE BY MOUTH THREE TIMES DAILY 05/09/22   Maeola Harman, MD  gabapentin (NEURONTIN) 300 MG capsule Take 1 capsule by mouth at bedtime 07/25/22   Dameron, Nolberto Hanlon, DO  glucose blood (ACCU-CHEK GUIDE) test strip 1 each by Other route 2 (two) times daily. Use as instructed 09/22/21   Shelby Mattocks, DO  guaiFENesin (ROBITUSSIN) 100 MG/5ML liquid Take 5-10 mLs (100-200 mg total) by mouth every 4 (four) hours as needed for cough or to loosen phlegm. 07/30/22   Cristopher Peru, PA-C  HYDROcodone-acetaminophen (NORCO/VICODIN) 5-325 MG tablet Take 1 tablet by mouth every 8 (eight) hours as needed for moderate pain. 09/23/22   Adonis Huguenin, NP  LANTUS 100 UNIT/ML injection Inject 0.05 mLs (5 Units total) into the skin 2 (two) times daily. 05/11/22   Dameron, Nolberto Hanlon, DO  levothyroxine (SYNTHROID) 150 MCG tablet TAKE 1 TABLET BY MOUTH ONCE DAILY BEFORE BREAKFAST *APPOINTMENT NEEDED FOR FURTHER REFILLS* 09/26/22   Dameron, Nolberto Hanlon, DO  midodrine (PROAMATINE) 5 MG tablet Take 1 tablet (5 mg total) by mouth Every Tuesday,Thursday,and Saturday with dialysis. 05/12/22   Dameron, Nolberto Hanlon, DO  multivitamin (RENA-VIT) TABS tablet Take 1 tablet by mouth at bedtime.  05/11/22   Dameron, Nolberto Hanlon, DO  rosuvastatin (CRESTOR) 20 MG tablet Take 1 tablet by mouth once daily 08/23/22   Dameron, Nolberto Hanlon, DO  Syringe, Disposable, (B-D SYRINGE SLIP TIP 30CC) 30 ML MISC 1 Syringe by Does not apply route daily. 01/31/19   Melene Plan, MD      Allergies    Hydralazine hcl    Review of Systems   Review of Systems  Physical Exam Updated Vital Signs BP 100/62 (BP Location: Right Arm)   Pulse (!) 125   Temp (!) 101 F (38.3 C) (Oral)   Resp 18   SpO2 100%  Physical Exam Constitutional:      Comments: Patient is alert.  Nontoxic.  No  respiratory distress at rest.  Morbid obesity.  HENT:     Mouth/Throat:     Pharynx: Oropharynx is clear.  Eyes:     Extraocular Movements: Extraocular movements intact.  Cardiovascular:     Comments: Tachycardia regular no appreciable rub murmur gallop. Pulmonary:     Effort: Pulmonary effort is normal.     Breath sounds: Normal breath sounds.  Abdominal:     Comments: Abdomen is soft and nontender.  Significant abdominal obesity.  No active rashes in skin folds.  Musculoskeletal:     Comments: No peripheral edema.  Right foot is normal in appearance.  Left foot has a ulcerated wound with purulent appearing drainage.  See attached image  Skin:    General: Skin is warm and dry.  Neurological:     General: No focal deficit present.     Mental Status: He is oriented to person, place, and time.     Motor: No weakness.     Coordination: Coordination normal.  Psychiatric:        Mood and Affect: Mood normal.     ED Results / Procedures / Treatments   Labs (all labs ordered are listed, but only abnormal results are displayed) Labs Reviewed  COMPREHENSIVE METABOLIC PANEL - Abnormal; Notable for the following components:      Result Value   Sodium 133 (*)    Chloride 93 (*)    CO2 20 (*)    Glucose, Bld 159 (*)    BUN 28 (*)    Creatinine, Ser 11.31 (*)    Total Protein 9.2 (*)    GFR, Estimated 6 (*)    Anion gap 20 (*)    All other components within normal limits  CBC - Abnormal; Notable for the following components:   RBC 6.18 (*)    HCT 54.8 (*)    RDW 19.8 (*)    All other components within normal limits  LACTIC ACID, PLASMA - Abnormal; Notable for the following components:   Lactic Acid, Venous 4.2 (*)    All other components within normal limits  LACTIC ACID, PLASMA - Abnormal; Notable for the following components:   Lactic Acid, Venous 3.4 (*)    All other components within normal limits  PROTIME-INR - Abnormal; Notable for the following components:    Prothrombin Time 21.2 (*)    INR 1.8 (*)    All other components within normal limits  APTT - Abnormal; Notable for the following components:   aPTT 37 (*)    All other components within normal limits  CULTURE, BLOOD (ROUTINE X 2)  CULTURE, BLOOD (ROUTINE X 2)  LIPASE, BLOOD  URINALYSIS, ROUTINE W REFLEX MICROSCOPIC    EKG EKG Interpretation  Date/Time:  Thursday Sep 29 2022 15:16:49 EDT  Ventricular Rate:  103 PR Interval:  154 QRS Duration: 89 QT Interval:  341 QTC Calculation: 447 R Axis:   52 Text Interpretation: Sinus tachycardia Consider right atrial enlargement similar to prior No STEMI Confirmed by Theda Belfast ([id number]) on 10/03/2022 2:12:37 PM  Radiology DG Foot Complete Left  Result Date: 09/29/2022 CLINICAL DATA:  Infected left foot. Plantar surface distal first metatarsal EXAM: LEFT FOOT - COMPLETE 3 VIEW COMPARISON:  CT 05/06/2021 FINDINGS: There has been prior amputation of the phalanges of the great toe in the distal half of the first metatarsal. At the distal margin of this is a focal area of soft tissue thickening and lucency. Please correlate for location of ulcer. No definite underlying erosive changes of the first metatarsal remnant. If there is further concern of bone infection, follow up bone scan or MRI may be useful for further sensitivity. There is chronic healed deformity of the distal shaft of the second metatarsal. There is also deformity of the head of third metatarsal which could be the sequela of previous AVN with collapse and lucency. Otherwise no fracture or dislocation. Preserved bone mineralization. IMPRESSION: Prior first ray amputation with adjacent soft tissue swelling and gas. No definite erosive changes at this time. More chronic healed fracture deformity of the shaft of second metatarsal. 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 These changes were not seen on the prior CT scan. Please correlate with any  more recent x-ray to compare assess for subtle change Electronically Signed   By: Karen Kays M.D.   On: 09/29/2022 13:32    Procedures Procedures   CRITICAL CARE Performed by: Arby Barrette   Total critical care time: 30 minutes  Critical care time was exclusive of separately billable procedures and treating other patients.  Critical care was necessary to treat or prevent imminent or life-threatening deterioration.  Critical care was time spent personally by me on the following activities: development of treatment plan with patient and/or surrogate as well as nursing, discussions with consultants, evaluation of patient's response to treatment, examination of patient, obtaining history from patient or surrogate, ordering and performing treatments and interventions, ordering and review of laboratory studies, ordering and review of radiographic studies, pulse oximetry and re-evaluation of patient's condition.  Medications Ordered in ED Medications  lactated ringers infusion (has no administration in time range)  lactated ringers bolus 250 mL (has no administration in time range)  vancomycin (VANCOCIN) IVPB 1000 mg/200 mL premix (has no administration in time range)  cefTRIAXone (ROCEPHIN) 2 g in sodium chloride 0.9 % 100 mL IVPB (has no administration in time range)  acetaminophen (TYLENOL) tablet 1,000 mg (has no administration in time range)    ED Course/ Medical Decision Making/ A&P                             Medical Decision Making Risk OTC drugs. Prescription drug management. Decision regarding hospitalization.   Patient has multiple severe comorbid illnesses of diabetes and ESRD on dialysis.  Patient has been febrile and fatigued at home.  Temperature in the emergency department is 102.3.  Patient is tachycardic.  He just finished dialysis.  He has a clear mental status and no respiratory distress.  Patient is febrile with elevated lactic acid, tachycardia.  Patient has  source for potential infection with the foot wound with risk of deep space infection or osteomyelitis.  Will initiate sepsis protocol.  Complex presentation with patient ESRD on dialysis and comorbid diabetes.  Patient was dialyzed partially today.  At this time he is getting volume resuscitation for sepsis.  Respiratory status and mental status remain clear.  Patient will require admission.  Consult: Family practice teaching service for admission.        Final Clinical Impression(s) / ED Diagnoses Final diagnoses:  Sepsis, due to unspecified organism, unspecified whether acute organ dysfunction present [facility])  Diabetic foot infection (HCC)    Rx / DC Orders ED Discharge Orders     None         Arby Barrette, MD 10/03/22 1720

## 2022-09-29 NOTE — Progress Notes (Signed)
Informed of patient by primary service. Admit for sepsis, left foot ulcerated+purulent wound. HD TTS @ [address]. Last treatment was today, short treatment, around 3 hrs . Outpatient orders: 4.5 hrs, F200. 2k, 2.5cal. EDW 153kg. Flow rates 450/autoflow 1.5. LUE AVG. Meds: Heparin bolus: 6,000 units and 12,000 units in chart (will need to clarify further with outpatient unit), hectorol qtreatment. Midodrine 5mg  prior to treatments. Not on ESAs or Fe.  Chart reviewed. Can likely keep on TTS schedule while he's in-house. Full consult to follow in AM. Discussed w/ primary service. Please call with any questions/concerns in the interim.  Anthony Sar, MD [facility]

## 2022-09-29 NOTE — Assessment & Plan Note (Deleted)
Afebrile overnight. BP on softer side but stable and likely to improve with dialysis and midodrine today. Orthopedics to evaluate further for potential surgical intervention today. Pending MRI this AM. Pain well controlled with Tylenol. -Orthopedics following, appreciate recs -Vancomycin, CTX, Flagyl -MRI pending -NPO pending Ortho eval -Wound care q4 -F/u BC -Pain management: Tylenol

## 2022-09-29 NOTE — Hospital Course (Addendum)
Paul [name] is a 32 y.o.male with a history of  ESRD on HD T/Th/Sa, T2DM, GERD, HLD, HTN, hypothyroidism, on [facility] for h/o left brachial artery occlusion s/p embolectomy who was admitted to the [facility] at [facility] for left foot wound. His hospital course is detailed below:  Sepsis L foot osteomyelitis    Other chronic conditions were medically managed with home medications and formulary alternatives as necessary (***)  PCP Follow-up Recommendations: F/u with Ortho (Dr. Lajoyce Corners) in 1 week

## 2022-09-29 NOTE — Progress Notes (Signed)
Pharmacy Antibiotic Note  Paul [name] is a 32 y.o. male admitted on 09/29/2022 with cellulitis. Pt has a chronic ulcer of the L foot and is followed by orthopedic surgery outpatient. Pharmacy has been consulted for Vancomycin dosing.  Pt has ESRD and receives HD TTS and last HD was on 5/9 prior to ED presentation.   Plan: Initiate loading dose of Vancomycin 2500mg  IV x 1 (1000mg  + 1500mg ), followed by  Vancomycin 1500mg  IV every TTS with dialysis    > Goal trough level 10-15 mcg/mL    > Check vancomycin levels at steady state  Continue Ceftriaxone 2g IV q24h per MD Monitor daily CBC, temp, SCr, and for clinical signs of improvement  F/u cultures and de-escalate antibiotics as able   Weight: (!) 155 kg (341 lb 11.4 oz)  Temp (24hrs), Avg:101.7 F (38.7 C), Min:101 F (38.3 C), Max:102.3 F (39.1 C)  Recent Labs  Lab 09/29/22 1218 09/29/22 1219 09/29/22 1250  WBC  --  8.8  --   CREATININE  --  11.31*  --   LATICACIDVEN 4.2*  --  3.4*    Estimated Creatinine Clearance: 15 mL/min (A) (by C-G formula based on SCr of 11.31 mg/dL (H)).    Allergies  Allergen Reactions   Hydralazine Hcl Other (See Comments)    DRUG-INDUCED LUPUS    Antimicrobials this admission: Ceftriaxone 5/9 >>  Vancomycin 5/9 >>   Dose adjustments this admission: N/A  Microbiology results: 5/9 BCx: sent  Thank you for allowing pharmacy to be a part of this patient's care.  Wilburn Cornelia, PharmD, BCPS Clinical Pharmacist 09/29/2022 3:13 PM   Please refer to AMION for pharmacy phone number

## 2022-09-29 NOTE — Progress Notes (Signed)
[facility] is following code sepsis 

## 2022-09-29 NOTE — Progress Notes (Addendum)
FMTS Interim Progress Note  S: Night rounded with Dr. Wynelle Link.  Patient resting comfortably, denies any pain.  O: BP (!) 107/54 (BP Location: Right Leg)   Pulse 96   Temp 99.2 F (37.3 C) (Oral)   Resp 18   Wt (!) 154.4 kg   SpO2 96%   BMI 43.69 kg/m     A/P:  -Vitals improving since admission and pt now afebrile. Alert and grossly oriented. Lactic acidosis now resolved.  - If BP drops, will give small bolus given ESRD.  - NPO @ [facility] per Ortho  - Rest of plan per day team H&P  Lincoln Brigham, MD 09/29/2022, 9:06 PM PGY-1, [facility] Service pager [phone]

## 2022-09-29 NOTE — Assessment & Plan Note (Addendum)
Tolerated 4 hours dialysis yesterday, 5/11 with 5.3 L removed. -Nephro following, appreciate recs -Avoid nephrotoxic agents  -Home binders -Midodrine with dialysis

## 2022-09-29 NOTE — ED Notes (Signed)
ED TO INPATIENT HANDOFF REPORT  ED Nurse Name and Phone #: Nicholos Johns [phone]  S Name/Age/Gender Paul [name] 32 y.o. male Room/Bed: 017C/017C  Code Status   Code Status: Full Code  Home/SNF/Other Home Patient oriented to: self, place, time, and situation Is this baseline? Yes   Triage Complete: Triage complete  Chief Complaint Severe sepsis (HCC) [A41.9, R65.20]  Triage Note Pt c/o feeling generally unwell for the last three days with malaise, chills, nausea and vomiting. Pt is a dialysis pt with partial tx earlier today. During triage he also notes having a chronic wound to his L foot.    Allergies Allergies  Allergen Reactions   Hydralazine Hcl Other (See Comments)    DRUG-INDUCED LUPUS    Level of Care/Admitting Diagnosis ED Disposition     ED Disposition  Admit   Condition  --   Comment  Hospital Area: [facility] [[id number]]  Level of Care: Progressive [102]  Admit to Progressive based on following criteria: MULTISYSTEM THREATS such as stable sepsis, metabolic/electrolyte imbalance with or without encephalopathy that is responding to early treatment.  May place patient in observation at [facility] or [facility] if equivalent level of care is available:: No  Covid Evaluation: Asymptomatic - no recent exposure (last 10 days) testing not required  Diagnosis: Severe sepsis [facility]) [[id number]]  Admitting Physician: Tiffany Kocher [[id number]]  Attending Physician: Carney Living [[id number]]          B Medical/Surgery History Past Medical History:  Diagnosis Date   Acute hypoxemic respiratory failure (HCC) 09/11/2016   Anemia    Chronic kidney disease    ARF on CRF Dialysis T/TH/Sa   Diabetes mellitus    Type II   End stage renal disease on dialysis (HCC)    [place]     GERD (gastroesophageal reflux disease)    diet controlled   HLD (hyperlipidemia)    Hypertension    Hypothyroidism    Morbid obesity (HCC)    Sacral wound     resolved   Thyroid disease    Past Surgical History:  Procedure Laterality Date   AMPUTATION Left 05/08/2021   Procedure: AMPUTATION GREAT TOE;  Surgeon: Nadara Mustard, MD;  Location: [facility] OR;  Service: Orthopedics;  Laterality: Left;   AV FISTULA PLACEMENT Left 09/26/2016   Procedure: LEFT UPPER ARM ARTERIOVENOUS (AV) FISTULA CREATION;  Surgeon: Sherren Kerns, MD;  Location: [facility] OR;  Service: Vascular;  Laterality: Left;   AV FISTULA PLACEMENT Left 12/02/2016   Procedure: INSERTION OF ARTERIOVENOUS GORE-TEX GRAFT LEFT UPPER  ARM USING A 4-7MM BY 45CM GRAFT ;  Surgeon: Larina Earthly, MD;  Location: [facility] OR;  Service: Vascular;  Laterality: Left;   AV FISTULA PLACEMENT Right 07/03/2020   Procedure: INSERTION OF ARTERIOVENOUS (AV) GORE-TEX GRAFT RIGHT UPPER ARM;  Surgeon: Cephus Shelling, MD;  Location: [facility] OR;  Service: Vascular;  Laterality: Right;   AV FISTULA PLACEMENT Left 07/02/2021   Procedure: LEFT UPPER EXTREMITY ARTERIOVENOUS FISTULA CREATION;  Surgeon: Maeola Harman, MD;  Location: [facility] OR;  Service: Vascular;  Laterality: Left;   BASCILIC VEIN TRANSPOSITION Right 05/06/2020   Procedure: FIRST STAGE RIGHT BASCILIC VEIN TRANSPOSITION;  Surgeon: Nada Libman, MD;  [facility];  Service: Vascular;  Laterality: Right;   BASCILIC VEIN TRANSPOSITION Left 08/20/2021   Procedure: LEFT ARM SECOND STAGE WITH ARTERIOVENOUS GRAFT;  Surgeon: Maeola Harman, MD;  Location: [facility] OR;  Service: Vascular;  Laterality: Left;   EXCHANGE OF  A DIALYSIS CATHETER Right 10/08/2016   Procedure: EXCHANGE OF A DIALYSIS CATHETER-RIGHT INTERNAL JUGULAR;  Surgeon: Fransisco Hertz, MD;  [facility];  Service: Vascular;  Laterality: Right;   INSERTION OF DIALYSIS CATHETER Right 09/26/2016   Procedure: INSERTION OF DIALYSIS CATHETER - Right Internal Jugular Placement;  Surgeon: Sherren Kerns, MD;  [facility];  Service: Vascular;  Laterality: Right;   INSERTION OF DIALYSIS CATHETER Left  03/22/2018   Procedure: INSERTION OF DIALYSIS CATHETER;  Surgeon: Cephus Shelling, MD;  [facility];  Service: Vascular;  Laterality: Left;   INTRAOPERATIVE ARTERIOGRAM Left 05/04/2022   Procedure: INTRA OPERATIVE ARTERIOGRAM;  Surgeon: Victorino Sparrow, MD;  Location: [facility] OR;  Service: Vascular;  Laterality: Left;   IR AV DIALY SHUNT INTRO NEEDLE/INTRACATH INITIAL W/PTA/IMG LEFT  08/19/2022   IR DIALY SHUNT INTRO NEEDLE/INTRACATH INITIAL W/IMG LEFT Left 04/15/2022   IR FLUORO GUIDE CV LINE LEFT  04/19/2022   IR FLUORO GUIDE CV LINE LEFT  05/30/2022   IR FLUORO GUIDE CV LINE RIGHT  03/26/2021   IR FLUORO GUIDE CV LINE RIGHT  05/13/2021   IR FLUORO GUIDE CV LINE RIGHT  05/04/2022   IR REMOVAL TUN CV CATH W/O FL  05/11/2021   IR REMOVAL TUN CV CATH W/O FL  10/06/2021   IR THROMBECTOMY AV FISTULA W/THROMBOLYSIS/PTA INC/SHUNT/IMG LEFT Left 02/02/2022   IR THROMBECTOMY AV FISTULA W/THROMBOLYSIS/PTA INC/SHUNT/IMG LEFT Left 03/22/2022   IR THROMBECTOMY AV FISTULA W/THROMBOLYSIS/PTA INC/SHUNT/IMG LEFT Left 05/04/2022   IR THROMBECTOMY AV FISTULA W/THROMBOLYSIS/PTA/STENT INC/SHUNT/IMG LT Left 04/27/2022   IR US GUIDE VASC ACCESS LEFT  02/02/2022   IR US GUIDE VASC ACCESS LEFT  03/22/2022   IR US GUIDE VASC ACCESS LEFT  04/15/2022   IR US GUIDE VASC ACCESS LEFT  04/19/2022   IR US GUIDE VASC ACCESS LEFT  04/27/2022   IR US GUIDE VASC ACCESS RIGHT  03/26/2021   IR US GUIDE VASC ACCESS RIGHT  05/13/2021   IR US GUIDE VASC ACCESS RIGHT  05/04/2022   REVISON OF ARTERIOVENOUS FISTULA Left 03/22/2018   Procedure: LEFT UPPER EXTREMITY ARTERIOVENOUS  REVISION WITH GORE-TEX GRAFT.;  Surgeon: Cephus Shelling, MD;  [facility];  Service: Vascular;  Laterality: Left;   TEE WITHOUT CARDIOVERSION N/A 05/12/2021   Procedure: TRANSESOPHAGEAL ECHOCARDIOGRAM (TEE);  Surgeon: Christell Constant, MD;  Location: [facility] ENDOSCOPY;  Service: Cardiovascular;  Laterality: N/A;   THROMBECTOMY Left 08/26/2019    thrombectomy of LUA loop AVG   THROMBECTOMY AND REVISION OF ARTERIOVENTOUS (AV) GORETEX  GRAFT Right 07/27/2020   Procedure: INSERTION OF RIGHT ARM LOOP GRAFT WITH EXCISION OF RIGHT ARM BRACHIAL AXILLARY GRAFT;  Surgeon: Cephus Shelling, MD;  [facility] OR;  Service: Vascular;  Laterality: Right;   THROMBECTOMY BRACHIAL ARTERY Left 05/04/2022   Procedure: LEFT ARM RADIAL BRACHIAL ARTERY EMBOLECTOMY;  Surgeon: Victorino Sparrow, MD;  Location: [facility] OR;  Service: Vascular;  Laterality: Left;   UPPER EXTREMITY VENOGRAPHY Bilateral 06/14/2021   Procedure: UPPER EXTREMITY VENOGRAPHY;  Surgeon: Maeola Harman, MD;  Location: [facility] INVASIVE CV LAB;  Service: Cardiovascular;  Laterality: Bilateral;   VISCERAL ANGIOGRAPHY Right 06/17/2020   Procedure: CENTRAL VENO;  Surgeon: Leonie Douglas, MD;  Location: [facility] INVASIVE CV LAB;  Service: Cardiovascular;  Laterality: Right;     A IV Location/Drains/Wounds Patient Lines/Drains/Airways Status     Active Line/Drains/Airways     Name Placement date Placement time Site Days   Peripheral IV 09/29/22 20 G Anterior;Right Forearm  09/29/22  1406  Forearm  less than 1   Fistula / Graft Left Upper arm Arteriovenous fistula 07/02/21  1013  Upper arm  454   Fistula / Graft Left Upper arm Arteriovenous vein graft 08/20/21  1350  Upper arm  405   Hemodialysis Catheter Left Internal jugular Double lumen Permanent (Tunneled) 05/30/22  1353  Internal jugular  122   Wound / Incision (Open or Dehisced) Arm fistulogram site --  --  Arm  --            Intake/Output Last 24 hours No intake or output data in the 24 hours ending 09/29/22 1718  Labs/Imaging Results for orders placed or performed during the hospital encounter of 09/29/22 (from the past 48 hour(s))  Lactic acid, plasma     Status: Abnormal   Collection Time: 09/29/22 12:18 PM  Result Value Ref Range   Lactic Acid, Venous 4.2 (HH) 0.5 - 1.9 mmol/L    Comment: CRITICAL RESULT CALLED TO, READ  BACK BY AND VERIFIED WITH Idamae Lusher, RN 1314 09/29/22 L. KLAR Performed at [facility] Lab, [address], [place], [place] [address]   Lipase, blood     Status: None   Collection Time: 09/29/22 12:19 PM  Result Value Ref Range   Lipase 47 11 - 51 U/L    Comment: Performed at [facility], [address], [place], [place] [address]  Comprehensive metabolic panel     Status: Abnormal   Collection Time: 09/29/22 12:19 PM  Result Value Ref Range   Sodium 133 (L) 135 - 145 mmol/L   Potassium 3.8 3.5 - 5.1 mmol/L   Chloride 93 (L) 98 - 111 mmol/L   CO2 20 (L) 22 - 32 mmol/L   Glucose, Bld 159 (H) 70 - 99 mg/dL    Comment: Glucose reference range applies only to samples taken after fasting for at least 8 hours.   BUN 28 (H) 6 - 20 mg/dL   Creatinine, Ser 51.88 (H) 0.61 - 1.24 mg/dL   Calcium 9.6 8.9 - 41.6 mg/dL   Total Protein 9.2 (H) 6.5 - 8.1 g/dL   Albumin 4.4 3.5 - 5.0 g/dL   AST 34 15 - 41 U/L   ALT 23 0 - 44 U/L   Alkaline Phosphatase 72 38 - 126 U/L   Total Bilirubin 0.8 0.3 - 1.2 mg/dL   GFR, Estimated 6 (L) >60 mL/min    Comment: (NOTE) Calculated using the CKD-EPI Creatinine Equation (2021)    Anion gap 20 (H) 5 - 15    Comment: Performed at [facility], [address], [place], [place] [address]  CBC     Status: Abnormal   Collection Time: 09/29/22 12:19 PM  Result Value Ref Range   WBC 8.8 4.0 - 10.5 K/uL   RBC 6.18 (H) 4.22 - 5.81 MIL/uL   Hemoglobin 16.6 13.0 - 17.0 g/dL   HCT 16.0 (H) 10.9 - 32.3 %   MCV 88.7 80.0 - 100.0 fL   MCH 26.9 26.0 - 34.0 pg   MCHC 30.3 30.0 - 36.0 g/dL   RDW 55.7 (H) 32.2 - 02.5 %   Platelets 219 150 - 400 K/uL   nRBC 0.0 0.0 - 0.2 %    Comment: Performed at [facility], [address] [place], [place] [address]  Lactic acid, plasma     Status: Abnormal   Collection Time: 09/29/22 12:50 PM  Result Value Ref Range  Lactic Acid, Venous 3.4 (HH) 0.5 - 1.9 mmol/L    Comment: CRITICAL VALUE NOTED. VALUE IS  CONSISTENT WITH PREVIOUSLY REPORTED/CALLED VALUE Performed at [facility], [address], [place], [place] [address]   Protime-INR     Status: Abnormal   Collection Time: 09/29/22 12:50 PM  Result Value Ref Range   Prothrombin Time 21.2 (H) 11.4 - 15.2 seconds   INR 1.8 (H) 0.8 - 1.2    Comment: (NOTE) INR goal varies based on device and disease states. Performed at [facility], [address], [place], [place] [address]   APTT     Status: Abnormal   Collection Time: 09/29/22 12:50 PM  Result Value Ref Range   aPTT 37 (H) 24 - 36 seconds    Comment:        IF BASELINE aPTT IS ELEVATED, SUGGEST PATIENT RISK ASSESSMENT BE USED TO DETERMINE APPROPRIATE ANTICOAGULANT THERAPY. Performed at [facility], [address], [place], [place] [address]    DG Foot Complete Left  Result Date: 09/29/2022 CLINICAL DATA:  Infected left foot. Plantar surface distal first metatarsal EXAM: LEFT FOOT - COMPLETE 3 VIEW COMPARISON:  CT 05/06/2021 FINDINGS: There [name] been prior amputation of the phalanges of the great toe in the distal half of the first metatarsal. At the distal margin of this is a focal area of soft tissue thickening and lucency. Please correlate for location of ulcer. No definite underlying erosive changes of the first metatarsal remnant. If there is further concern of bone infection, follow up bone scan or MRI may be useful for further sensitivity. There is chronic healed deformity of the distal shaft of the second metatarsal. There is also deformity of the head of third metatarsal which could be the sequela of previous AVN with collapse and lucency. Otherwise no fracture or dislocation. Preserved bone mineralization. IMPRESSION: Prior first ray amputation with adjacent soft tissue swelling and gas. No definite erosive changes at this time. More chronic healed fracture deformity of the shaft of second metatarsal. 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 These changes were not seen on the prior CT scan. Please correlate with any more recent x-ray to compare assess for subtle change Electronically Signed   By: Karen Kays M.D.   On: 09/29/2022 13:32    Pending Labs Unresulted Labs (From admission, onward)     Start     Ordered   09/30/22 0500  Renal function panel  Tomorrow morning,   R        09/29/22 1643   09/30/22 0500  CBC  Tomorrow morning,   R        09/29/22 1643   09/29/22 1639  HIV Antibody (routine testing w rflx)  (HIV Antibody (Routine testing w reflex) panel)  Once,   R        09/29/22 1643   09/29/22 1218  Blood Culture (routine x 2)  (Undifferentiated presentation (screening labs and basic nursing orders))  BLOOD CULTURE X 2,   STAT      09/29/22 1218   09/29/22 1205  Urinalysis, Routine w reflex microscopic -Urine, Clean Catch  Once,   URGENT       Question:  Specimen Source  Answer:  Urine, Clean Catch   09/29/22 1205            Vitals/Pain Today's Vitals   09/29/22 1400 09/29/22 1458 09/29/22 1500 09/29/22 1530  BP:  100/67    Pulse: (!) 125 (!) 120 (!) 105 (!) 107  Resp:  18  18  Temp:  (!) 101.1 F (38.4 C)    TempSrc:  Oral    SpO2: 100% 100% 100% 100%  Weight: (!) 155 kg     PainSc:        Isolation Precautions No active isolations  Medications Medications  acetaminophen (TYLENOL) tablet 1,000 mg (1,000 mg Oral Given 09/29/22 1545)  vancomycin (VANCOCIN) IVPB 1000 mg/200 mL premix (1,000 mg Intravenous New Bag/Given 09/29/22 1434)    Followed by  vancomycin (VANCOREADY) IVPB 1500 mg/300 mL (1,500 mg Intravenous New Bag/Given 09/29/22 1545)  vancomycin (VANCOREADY) IVPB 1500 mg/300 mL ([name] no administration in time range)  midodrine (PROAMATINE) tablet 5 mg ([name] no administration in time range)  rosuvastatin (CRESTOR) tablet 20 mg ([name] no administration in time range)  cinacalcet (SENSIPAR) tablet 30 mg ([name] no administration in time range)  levothyroxine  (SYNTHROID) tablet 150 mcg ([name] no administration in time range)  apixaban (ELIQUIS) tablet 5 mg ([name] no administration in time range)  ferric citrate (AURYXIA) tablet 420-840 mg ([name] no administration in time range)  calcium carbonate (TUMS - dosed in mg elemental calcium) chewable tablet 1,000 mg ([name] no administration in time range)  gabapentin (NEURONTIN) capsule 100 mg ([name] no administration in time range)  gabapentin (NEURONTIN) capsule 300 mg ([name] no administration in time range)  multivitamin (RENA-VIT) tablet 1 tablet ([name] no administration in time range)  acetaminophen (TYLENOL) suppository 650 mg ([name] no administration in time range)  ondansetron (ZOFRAN) tablet 4 mg ([name] no administration in time range)    Or  ondansetron (ZOFRAN) injection 4 mg ([name] no administration in time range)  lactated ringers bolus 250 mL (250 mLs Intravenous Bolus from Bag 09/29/22 1507)  cefTRIAXone (ROCEPHIN) 2 g in sodium chloride 0.9 % 100 mL IVPB (0 g Intravenous Stopped 09/29/22 1507)  ondansetron (ZOFRAN) injection 4 mg (4 mg Intravenous Given 09/29/22 1422)    Mobility walks     Focused Assessments Renal Assessment Handoff:  Hemodialysis Schedule: Hemodialysis Schedule: Tuesday/Thursday/Saturday Last Hemodialysis date and time: today   Restricted appendage: left arm   R Recommendations: See Admitting Provider Note  Report given to:   Additional Notes: Marland KitchenMarland Kitchen

## 2022-09-29 NOTE — ED Triage Notes (Signed)
Pt c/o feeling generally unwell for the last three days with malaise, chills, nausea and vomiting. Pt is a dialysis pt with partial tx earlier today. During triage he also notes having a chronic wound to his L foot.

## 2022-09-29 NOTE — H&P (Signed)
Family Medicine Teaching [facility] Admission History and Physical Service Pager: [phone]  Patient name: Paul [name] Medical record number: [id number] Date of birth: 12/14/1990 Age: 32 y.o. Gender: male  Primary Care Provider: Darral Dash, DO Consultants: Marguerita Beards Code Status: FULL  Preferred Emergency Contact:  Contact Information     Name Relation Home Work [place] Sister [phone]  [phone]   [facility] Mother [phone]         Chief Complaint: fever, foot ulceration, tachycardia  Assessment and Plan: Paul [name] is a 32 y.o. male presenting with concern of sepsis with current likely source being infection from foot wound. 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 No evidence of other sources of infection, no abdominal etiology and non-peritoneal, no urinary symptoms, no other wounds.     * Severe sepsis (HCC) Foot wound with h/o 1st ray partial amputation performed with Dr. Lajoyce Corners. Worsening symptomatically with lactic acidosis, tachycardia, fever on admission. Concern for possibility of osteomyelitis.  Tenuous fluid status with ESRD on HD. - Admit to FMTS, Dr. Deirdre Priest as attending - Vancomycin, ceftriaxone, Flagyl - De-escalate antibiotics as able - Hold on extra fluids, bolus as indicated - Renal diet, n.p.o. at midnight - Orthopedics consulted, appreciate recommendations - Consider MRI of the foot, coordinate with dialysis  - Wound care q4 -- Bcx -- Tylenol for pain relief  -- Consider oxy low dose for pain relief  ESRD on dialysis (HCC) ESRD on HD-Missed 09/29/22.  -- Nephro consulted, appreciate recs -- Avoid nephrotoxic agents  -- Adding home binders -- Midodrine with dialysis   Type 2 diabetes mellitus (HCC) T2DM on Lantus 5-8 U BID  - UTD A1C ordered  - CBGs   Hypothyroidism On Levothyroxine, repeat TSH.  Previous several months ago.    HTN  Soft pressures today, possibly related to suspected infection  -- Hold home antihypertensives   HLD Home rosuvastatin restart when indicated   h/o left brachial artery occlusion s/p embolectomy--on Eliquis   FEN/GI: NPO at [facility]>>renal diet Prophylaxis: Eliquis  Disposition: Med Surg   History of Present Illness:  Paul [name] is a 32 y.o. male with PMHx ESRD on HD T/Th/Sa, T2DM, GERD, HLD, HTN, hypothyroidism presenting with concern of fever, tachycardia, as well as foot wound.   Previously had partial ray amputation on the left with open ulceration on the foot. Reports of persistent fatigue and subjective chills for the last few days.   He has also had some nausea over the last few days. Had nausea when he went to dialysis today.   He does typically have feeling in his feet but does not feel his current wound on the left.  Follows routinely with Dr. Lajoyce Corners at [facility]. Started to notice more drainage in the foot when he started walking more recently, has had worsening drainage and smell in the foot as well.   In ED, he had a lactic acidosis 4.2>3.4 after fluid resuscitation. He was started on vancomycin and CTX, was given Tylenol as well.   Partial dialysis session day today, was feeling sick so stopped 45 minutes to completing the session.   No cough or runny nose.   Does use Vicodin intermittently for pain control, has not had his morning medication.    Review Of Systems: Per HPI with the following additions:   Chills present No chest pain or dyspnea    Past Medical History: ESRD  on HD T/Th/Sa, T2DM, GERD, HLD, HTN, hypothyroidism  Past Surgical History: AV fistula  1st ray partial amputation   Social History: No substance use.  No alcohol use  Lives with his mom No tobacco use--smokes marijuana, last night was last time (smokes 1 blunt a day)  Please also refer to relevant sections of EMR.  Family History: Mom, sister --  DM, HTN    Allergies and Medications: Renavit  Sensipar Rosuvastatin  Eliquis ASA Levothyroxine  Auryxia  Lantus 5-8 U twice a day  Gabapentin 100 TID and 300 QHS  Objective: BP 100/67 (BP Location: Right Arm)   Pulse (!) 107   Temp (!) 101.1 F (38.4 C) (Oral)   Resp 18   Wt (!) 155 kg   SpO2 100%   BMI 43.87 kg/m  Exam: General: well appearing, non toxic, obese AAM, very pleasant  Eyes: EOMI ENTM: Normal nares, external ears, mouth Neck: NTTP, no LAD Cardiovascular: rrr, no m/g/r Respiratory: CTAB  Gastrointestinal: NTTP, no distension, soft, BS present  MSK: Moving all extremities Derm: Clean base, ulceration, erythematous, warmth around area, no drainage noted  Neuro: A&O x3 Psych: Normal affect and mood   Labs and Imaging: CBC BMET  Recent Labs  Lab 09/29/22 1219  WBC 8.8  HGB 16.6  HCT 54.8*  PLT 219   Recent Labs  Lab 09/29/22 1219  NA 133*  K 3.8  CL 93*  CO2 20*  BUN 28*  CREATININE 11.31*  GLUCOSE 159*  CALCIUM 9.6     EKG: Sinus tach QTC 447   DG Foot Complete Left  Result Date: 09/29/2022 IMPRESSION: Prior first ray amputation with adjacent soft tissue swelling and gas. No definite erosive changes at this time. More chronic healed fracture deformity of the shaft of second metatarsal. 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 These changes were not seen on the prior CT scan. Please correlate with any more recent x-ray to compare assess for subtle change    Alfredo Martinez, MD 09/29/2022, 5:51 PM PGY-2, [facility] Family Medicine FPTS Intern pager: [phone], text pages welcome

## 2022-09-29 NOTE — Assessment & Plan Note (Deleted)
TSH wnl. Continue home therapy of Levothyroxine 

## 2022-09-29 NOTE — Progress Notes (Signed)
Received consult from Dr. Claudean Severance regarding foot ulcer and sepsis.  Agree with broad spectrum IV abx.  MRI pending.  Will make patient NPO after midnight.  I have contacted Dr. Lajoyce Corners for further evaluation and treatment.    Mayra Reel, MD [facility] 7:01 PM

## 2022-09-29 NOTE — ED Provider Triage Note (Signed)
Emergency Medicine Provider Triage Evaluation Note  Paul [name] , a 32 y.o. male  was evaluated in triage.  Pt complains of chills, nausea, vomiting, not feeling well x 3 days. T/R/S dialysis patient, had most of his treatment today. Throwing up after he tries to eat. No recorded fever. No pain. Also has foot wound he sees ortho for, previously had left great toe amputation by Dr Lajoyce Corners about a year ago. Feels it has been smelling a little off recently.   Review of Systems  Positive: Fatigue, nausea, vomiting, chills Negative: abd pain, diarrhea  Physical Exam  BP (!) 105/51 (BP Location: Right Arm)   Pulse (!) 133   Temp (!) 102.3 F (39.1 C) (Oral)   Resp 17   SpO2 100%  Gen:   Awake, no distress   Resp:  Normal effort  MSK:   Moves extremities without difficulty  Other:    Medical Decision Making  Medically screening exam initiated at 12:17 PM.  Appropriate orders placed.  [name] was informed that the remainder of the evaluation will be completed by another provider, this initial triage assessment does not replace that evaluation, and the importance of remaining in the ED until their evaluation is complete.  Tachycardic and febrile with foot wound, dialysis patient, sepsis workup initiated   [name] T, PA-C 09/29/22 1217

## 2022-09-29 NOTE — Assessment & Plan Note (Addendum)
 BGL fairly controlled, consistently in 100s. -vsSSI with CBG checks

## 2022-09-30 ENCOUNTER — Ambulatory Visit: Payer: Medicaid Other

## 2022-09-30 ENCOUNTER — Observation Stay (HOSPITAL_COMMUNITY): Payer: Medicaid Other

## 2022-09-30 DIAGNOSIS — L02612 Cutaneous abscess of left foot: Secondary | ICD-10-CM

## 2022-09-30 DIAGNOSIS — E1122 Type 2 diabetes mellitus with diabetic chronic kidney disease: Secondary | ICD-10-CM | POA: Diagnosis not present

## 2022-09-30 DIAGNOSIS — A419 Sepsis, unspecified organism: Secondary | ICD-10-CM | POA: Diagnosis not present

## 2022-09-30 DIAGNOSIS — M86172 Other acute osteomyelitis, left ankle and foot: Secondary | ICD-10-CM | POA: Insufficient documentation

## 2022-09-30 DIAGNOSIS — M86272 Subacute osteomyelitis, left ankle and foot: Secondary | ICD-10-CM | POA: Diagnosis not present

## 2022-09-30 DIAGNOSIS — M87875 Other osteonecrosis, left foot: Secondary | ICD-10-CM | POA: Diagnosis not present

## 2022-09-30 DIAGNOSIS — Z6841 Body Mass Index (BMI) 40.0 and over, adult: Secondary | ICD-10-CM | POA: Diagnosis not present

## 2022-09-30 DIAGNOSIS — N250 Renal osteodystrophy: Secondary | ICD-10-CM | POA: Diagnosis not present

## 2022-09-30 DIAGNOSIS — E11628 Type 2 diabetes mellitus with other skin complications: Secondary | ICD-10-CM | POA: Diagnosis not present

## 2022-09-30 DIAGNOSIS — L97529 Non-pressure chronic ulcer of other part of left foot with unspecified severity: Secondary | ICD-10-CM | POA: Diagnosis not present

## 2022-09-30 DIAGNOSIS — E1169 Type 2 diabetes mellitus with other specified complication: Secondary | ICD-10-CM | POA: Diagnosis not present

## 2022-09-30 DIAGNOSIS — I120 Hypertensive chronic kidney disease with stage 5 chronic kidney disease or end stage renal disease: Secondary | ICD-10-CM | POA: Diagnosis not present

## 2022-09-30 DIAGNOSIS — Z888 Allergy status to other drugs, medicaments and biological substances status: Secondary | ICD-10-CM | POA: Diagnosis not present

## 2022-09-30 DIAGNOSIS — N2581 Secondary hyperparathyroidism of renal origin: Secondary | ICD-10-CM | POA: Diagnosis not present

## 2022-09-30 DIAGNOSIS — E039 Hypothyroidism, unspecified: Secondary | ICD-10-CM | POA: Diagnosis not present

## 2022-09-30 DIAGNOSIS — L089 Local infection of the skin and subcutaneous tissue, unspecified: Secondary | ICD-10-CM

## 2022-09-30 DIAGNOSIS — E872 Acidosis, unspecified: Secondary | ICD-10-CM | POA: Diagnosis not present

## 2022-09-30 DIAGNOSIS — Z8249 Family history of ischemic heart disease and other diseases of the circulatory system: Secondary | ICD-10-CM | POA: Diagnosis not present

## 2022-09-30 DIAGNOSIS — R112 Nausea with vomiting, unspecified: Secondary | ICD-10-CM | POA: Diagnosis not present

## 2022-09-30 DIAGNOSIS — D631 Anemia in chronic kidney disease: Secondary | ICD-10-CM | POA: Diagnosis not present

## 2022-09-30 DIAGNOSIS — Z794 Long term (current) use of insulin: Secondary | ICD-10-CM | POA: Diagnosis not present

## 2022-09-30 DIAGNOSIS — N186 End stage renal disease: Secondary | ICD-10-CM

## 2022-09-30 DIAGNOSIS — Z992 Dependence on renal dialysis: Secondary | ICD-10-CM

## 2022-09-30 DIAGNOSIS — E785 Hyperlipidemia, unspecified: Secondary | ICD-10-CM | POA: Diagnosis not present

## 2022-09-30 DIAGNOSIS — R000 Tachycardia, unspecified: Secondary | ICD-10-CM | POA: Diagnosis not present

## 2022-09-30 DIAGNOSIS — Z79890 Hormone replacement therapy (postmenopausal): Secondary | ICD-10-CM | POA: Diagnosis not present

## 2022-09-30 DIAGNOSIS — E11621 Type 2 diabetes mellitus with foot ulcer: Secondary | ICD-10-CM | POA: Diagnosis not present

## 2022-09-30 DIAGNOSIS — R6520 Severe sepsis without septic shock: Secondary | ICD-10-CM | POA: Diagnosis not present

## 2022-09-30 DIAGNOSIS — K219 Gastro-esophageal reflux disease without esophagitis: Secondary | ICD-10-CM | POA: Diagnosis not present

## 2022-09-30 DIAGNOSIS — M869 Osteomyelitis, unspecified: Secondary | ICD-10-CM | POA: Diagnosis not present

## 2022-09-30 DIAGNOSIS — L97429 Non-pressure chronic ulcer of left heel and midfoot with unspecified severity: Secondary | ICD-10-CM | POA: Diagnosis not present

## 2022-09-30 LAB — TSH: TSH: 3.922 u[IU]/mL (ref 0.350–4.500)

## 2022-09-30 LAB — SURGICAL PCR SCREEN
MRSA, PCR: NEGATIVE
Staphylococcus aureus: NEGATIVE

## 2022-09-30 LAB — RENAL FUNCTION PANEL
Albumin: 3.3 g/dL — ABNORMAL LOW (ref 3.5–5.0)
Anion gap: 18 — ABNORMAL HIGH (ref 5–15)
BUN: 40 mg/dL — ABNORMAL HIGH (ref 6–20)
CO2: 24 mmol/L (ref 22–32)
Calcium: 9 mg/dL (ref 8.9–10.3)
Chloride: 93 mmol/L — ABNORMAL LOW (ref 98–111)
Creatinine, Ser: 12.87 mg/dL — ABNORMAL HIGH (ref 0.61–1.24)
GFR, Estimated: 5 mL/min — ABNORMAL LOW (ref >60)
Glucose, Bld: 187 mg/dL — ABNORMAL HIGH (ref 70–99)
Phosphorus: 5.1 mg/dL — ABNORMAL HIGH (ref 2.5–4.6)
Potassium: 3.6 mmol/L (ref 3.5–5.1)
Sodium: 135 mmol/L (ref 135–145)

## 2022-09-30 LAB — GLUCOSE, CAPILLARY
Glucose-Capillary: 203 mg/dL — ABNORMAL HIGH (ref 70–99)
Glucose-Capillary: 176 mg/dL — ABNORMAL HIGH (ref 70–99)
Glucose-Capillary: 139 mg/dL — ABNORMAL HIGH (ref 70–99)
Glucose-Capillary: 183 mg/dL — ABNORMAL HIGH (ref 70–99)
Glucose-Capillary: 192 mg/dL — ABNORMAL HIGH (ref 70–99)

## 2022-09-30 LAB — CBC
HCT: 43.2 % (ref 39.0–52.0)
Hemoglobin: 14.4 g/dL (ref 13.0–17.0)
MCH: 27.3 pg (ref 26.0–34.0)
MCHC: 33.3 g/dL (ref 30.0–36.0)
MCV: 81.8 fL (ref 80.0–100.0)
Platelets: 222 10*3/uL (ref 150–400)
RBC: 5.28 MIL/uL (ref 4.22–5.81)
RDW: 18.2 % — ABNORMAL HIGH (ref 11.5–15.5)
WBC: 7.1 10*3/uL (ref 4.0–10.5)
nRBC: 0 % (ref 0.0–0.2)

## 2022-09-30 LAB — HEMOGLOBIN A1C
Hgb A1c MFr Bld: 7.4 % — ABNORMAL HIGH (ref 4.8–5.6)
Mean Plasma Glucose: 165.68 mg/dL

## 2022-09-30 LAB — HEPATITIS B SURFACE ANTIGEN: Hepatitis B Surface Ag: NONREACTIVE

## 2022-09-30 LAB — CULTURE, BLOOD (ROUTINE X 2): Culture: NO GROWTH

## 2022-09-30 MED ORDER — DOXERCALCIFEROL 4 MCG/2ML IV SOLN
3.0000 ug | INTRAVENOUS | Status: DC
  Administered 2022-10-01: 3 ug via INTRAVENOUS
  Filled 2022-09-30 (×2): qty 2

## 2022-09-30 MED ORDER — CHLORHEXIDINE GLUCONATE 4 % EX SOLN
60.0000 mL | Freq: Once | CUTANEOUS | Status: AC
  Administered 2022-10-01: 4 via TOPICAL
  Filled 2022-09-30: qty 15

## 2022-09-30 MED ORDER — POVIDONE-IODINE 10 % EX SWAB
2.0000 | Freq: Once | CUTANEOUS | Status: AC
  Administered 2022-10-01: 2 via TOPICAL

## 2022-09-30 MED ORDER — CEFAZOLIN IN SODIUM CHLORIDE 3-0.9 GM/100ML-% IV SOLN
3.0000 g | INTRAVENOUS | Status: AC
  Filled 2022-09-30: qty 100

## 2022-09-30 MED ORDER — SODIUM CHLORIDE 0.9 % IV SOLN
2.0000 g | INTRAVENOUS | Status: DC
  Administered 2022-09-30: 2 g via INTRAVENOUS
  Filled 2022-09-30: qty 20

## 2022-09-30 MED ORDER — HYDROCODONE-ACETAMINOPHEN 5-325 MG PO TABS
1.0000 | ORAL_TABLET | Freq: Once | ORAL | Status: AC
  Administered 2022-09-30: 1 via ORAL
  Filled 2022-09-30: qty 1

## 2022-09-30 MED ORDER — PROSOURCE PLUS PO LIQD
30.0000 mL | Freq: Two times a day (BID) | ORAL | Status: DC
  Administered 2022-10-01 – 2022-10-02 (×2): 30 mL via ORAL
  Filled 2022-09-30 (×2): qty 30

## 2022-09-30 MED ORDER — GADOBUTROL 1 MMOL/ML IV SOLN
10.0000 mL | Freq: Once | INTRAVENOUS | Status: AC | PRN
  Administered 2022-09-30: 10 mL via INTRAVENOUS

## 2022-09-30 NOTE — TOC Initial Note (Signed)
Transition of Care [facility]) - Initial/Assessment Note    Patient Details  Name: Paul [name] MRN: [id number] Date of Birth: 09-Apr-1991  Transition of Care [facility]) CM/SW Contact:    Kermit Balo, RN Phone Number: 09/30/2022, 1:08 PM  Clinical Narrative:                 Patient is from home with his mother.  His mother over sees his medications and they deny any issues. [facility] on [address]  Pt denies issues with transportation.  No DME at home.  PCP: [facility] family practice  TOC following for d/c needs.   Expected Discharge Plan: Home/Self Care Barriers to Discharge: Continued Medical Work up   Patient Goals and CMS Choice            Expected Discharge Plan and Services   Discharge Planning Services: CM Consult   Living arrangements for the past 2 months: Apartment                                      Prior Living Arrangements/Services Living arrangements for the past 2 months: Apartment Lives with:: Parents Patient language and need for interpreter reviewed:: Yes Do you feel safe going back to the place where you live?: Yes            Criminal Activity/Legal Involvement Pertinent to Current Situation/Hospitalization: No - Comment as needed  Activities of Daily Living Home Assistive Devices/Equipment: None ADL Screening (condition at time of admission) Patient's cognitive ability adequate to safely complete daily activities?: Yes Is the patient deaf or have difficulty hearing?: No Does the patient have difficulty seeing, even when wearing glasses/contacts?: No Does the patient have difficulty concentrating, remembering, or making decisions?: No Patient able to express need for assistance with ADLs?: Yes Does the patient have difficulty dressing or bathing?: No Independently performs ADLs?: Yes (appropriate for developmental age) Does the patient have difficulty walking or climbing stairs?: No Weakness of Legs: None Weakness of  Arms/Hands: None  Permission Sought/Granted                  Emotional Assessment Appearance:: Appears stated age Attitude/Demeanor/Rapport: Engaged Affect (typically observed): Accepting Orientation: : Oriented to Self, Oriented to Place, Oriented to  Time, Oriented to Situation   Psych Involvement: No (comment)  Admission diagnosis:  Diabetic foot infection (HCC) [W09.811, L08.9] Severe sepsis (HCC) [A41.9, R65.20] Sepsis, due to unspecified organism, unspecified whether acute organ dysfunction present [facility]) [A41.9] Patient Active Problem List   Diagnosis Date Noted   Severe sepsis (HCC) 09/29/2022   Sepsis (HCC) 09/29/2022   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   Thrombus 05/04/2022   Ischemia of left upper extremity 05/04/2022   Complication of vascular access for dialysis 04/19/2022   ESRD (end stage renal disease) (HCC) 04/19/2022   Failure of hemodialysis access (HCC) 04/18/2022   Left hand pain 09/22/2021   Diabetic foot infection (HCC)    Wound infection 05/07/2021   MSSA bacteremia 05/07/2021   Osteomyelitis (HCC) 05/07/2021   Abscess 05/07/2021   Sepsis due to cellulitis (HCC) 05/06/2021   Poor compliance with medication 03/17/2021   Fluid overload, unspecified 07/09/2020   Complication of vascular dialysis catheter 03/23/2020   Other disorders of phosphorus metabolism 01/28/2020   Hypertension 01/08/2020   Hypocalcemia 10/29/2019   ESRD on dialysis (HCC) 11/16/2017   Pruritus, unspecified 04/04/2017   Unspecified protein-calorie  malnutrition (HCC) 11/11/2016   Headache, unspecified 10/29/2016   Encounter for immunization 10/19/2016   Iron deficiency anemia, unspecified 10/10/2016   Anemia in chronic kidney disease 09/30/2016   Coagulation defect, unspecified (HCC) 09/30/2016   Pneumonia due to Pseudomonas (HCC) 09/30/2016   Secondary hyperparathyroidism of renal origin (HCC) 09/30/2016   Hypothyroidism 10/29/2009   Type 2 diabetes  mellitus (HCC) 04/14/2009   Hyperlipidemia 04/14/2009   OBESITY, MORBID 04/14/2009   Essential hypertension, benign 04/14/2009   PCP:  Darral Dash, DO Pharmacy:   [id number] [address] [place] [address] [place] [address] Phone: [phone] Fax: [phone]  [facility] [place], [place] - [address] [employer], [address] [place] [address] Phone: [phone] Fax: [phone]  [facility] [address] [place] [address] Phone: [phone] Fax: [phone]     Social Determinants of Health (SDOH) Social History: SDOH Screenings   Food Insecurity: No Food Insecurity (09/29/2022)  Housing: Low Risk  (09/29/2022)  Transportation Needs: No Transportation Needs (09/29/2022)  Utilities: Not At Risk (09/29/2022)  Alcohol Screen: Low Risk  (06/07/2021)  Depression (PHQ2-9): Medium Risk (05/11/2022)  Physical Activity: Insufficiently Active (07/07/2021)  Social Connections: Socially Isolated (06/07/2021)  Stress: Stress Concern Present (08/06/2021)  Tobacco Use: Low Risk  (09/14/2022)   SDOH Interventions:     Readmission Risk Interventions     No data to display

## 2022-09-30 NOTE — H&P (View-Only) (Signed)
ORTHOPAEDIC CONSULTATION  REQUESTING PHYSICIAN: Carney Living, MD  Chief Complaint: Fever chills and chronic ulcer left foot  HPI: Paul [name] is a 32 y.o. 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  Patient states that recently he has developed fever and chills that led him for admission.  Patient has type 2 diabetes end-stage renal disease on dialysis.  Patient is 2 years out from his left foot first ray amputation.  Past Medical History:  Diagnosis Date   Acute hypoxemic respiratory failure (HCC) 09/11/2016   Anemia    Chronic kidney disease    ARF on CRF Dialysis T/TH/Sa   Diabetes mellitus    Type II   End stage renal disease on dialysis (HCC)    [place]    GERD (gastroesophageal reflux disease)    diet controlled   HLD (hyperlipidemia)    Hypertension    Hypothyroidism    Morbid obesity (HCC)    Sacral wound    resolved   Thyroid disease    Past Surgical History:  Procedure Laterality Date   AMPUTATION Left 05/08/2021   Procedure: AMPUTATION GREAT TOE;  Surgeon: Nadara Mustard, MD;  [facility]  Service: [facility]opedics;  Laterality: Left;   AV FISTULA PLACEMENT Left 09/26/2016   Procedure: LEFT UPPER ARM ARTERIOVENOUS (AV) FISTULA CREATION;  Surgeon: Sherren Kerns, MD;  [facility];  Service: Vascular;  Laterality: Left;   AV FISTULA PLACEMENT Left 12/02/2016   Procedure: INSERTION OF ARTERIOVENOUS GORE-TEX GRAFT LEFT UPPER  ARM USING A 4-7MM BY 45CM GRAFT ;  Surgeon: Larina Earthly, MD;  [facility];  Service: Vascular;  Laterality: Left;   AV FISTULA PLACEMENT Right 07/03/2020   Procedure: INSERTION OF ARTERIOVENOUS (AV) GORE-TEX GRAFT RIGHT UPPER ARM;  Surgeon: Cephus Shelling, MD;  [facility];  Service: Vascular;  Laterality: Right;   AV FISTULA PLACEMENT Left 07/02/2021   Procedure: LEFT UPPER EXTREMITY ARTERIOVENOUS FISTULA CREATION;  Surgeon: Maeola Harman, MD;   Location: [facility] OR;  Service: Vascular;  Laterality: Left;   BASCILIC VEIN TRANSPOSITION Right 05/06/2020   Procedure: FIRST STAGE RIGHT BASCILIC VEIN TRANSPOSITION;  Surgeon: Nada Libman, MD;  [facility];  Service: Vascular;  Laterality: Right;   BASCILIC VEIN TRANSPOSITION Left 08/20/2021   Procedure: LEFT ARM SECOND STAGE WITH ARTERIOVENOUS GRAFT;  Surgeon: Maeola Harman, MD;  Location: [facility] OR;  Service: Vascular;  Laterality: Left;   EXCHANGE OF A DIALYSIS CATHETER Right 10/08/2016   Procedure: EXCHANGE OF A DIALYSIS CATHETER-RIGHT INTERNAL JUGULAR;  Surgeon: Fransisco Hertz, MD;  [facility];  Service: Vascular;  Laterality: Right;   INSERTION OF DIALYSIS CATHETER Right 09/26/2016   Procedure: INSERTION OF DIALYSIS CATHETER - Right Internal Jugular Placement;  Surgeon: Sherren Kerns, MD;  Location: [facility] OR;  Service: Vascular;  Laterality: Right;   INSERTION OF DIALYSIS CATHETER Left 03/22/2018   Procedure: INSERTION OF DIALYSIS CATHETER;  Surgeon: Cephus Shelling, MD;  Location: [facility] OR;  Service: Vascular;  Laterality: Left;   INTRAOPERATIVE ARTERIOGRAM Left 05/04/2022   Procedure: INTRA OPERATIVE ARTERIOGRAM;  Surgeon: Victorino Sparrow, MD;  Location: [facility] OR;  Service: Vascular;  Laterality: Left;   IR AV DIALY SHUNT INTRO NEEDLE/INTRACATH INITIAL W/PTA/IMG LEFT  08/19/2022   IR DIALY SHUNT INTRO NEEDLE/INTRACATH INITIAL W/IMG LEFT Left 04/15/2022   IR FLUORO GUIDE CV LINE LEFT  04/19/2022   IR FLUORO GUIDE CV LINE LEFT  05/30/2022   IR FLUORO GUIDE CV LINE RIGHT  03/26/2021   IR FLUORO GUIDE CV LINE RIGHT  05/13/2021   IR FLUORO GUIDE CV LINE RIGHT  05/04/2022   IR REMOVAL TUN CV CATH W/O FL  05/11/2021   IR REMOVAL TUN CV CATH W/O FL  10/06/2021   IR THROMBECTOMY AV FISTULA W/THROMBOLYSIS/PTA INC/SHUNT/IMG LEFT Left 02/02/2022   IR THROMBECTOMY AV FISTULA W/THROMBOLYSIS/PTA INC/SHUNT/IMG LEFT Left 03/22/2022   IR THROMBECTOMY AV FISTULA W/THROMBOLYSIS/PTA INC/SHUNT/IMG  LEFT Left 05/04/2022   IR THROMBECTOMY AV FISTULA W/THROMBOLYSIS/PTA/STENT INC/SHUNT/IMG LT Left 04/27/2022   IR US GUIDE VASC ACCESS LEFT  02/02/2022   IR US GUIDE VASC ACCESS LEFT  03/22/2022   IR US GUIDE VASC ACCESS LEFT  04/15/2022   IR US GUIDE VASC ACCESS LEFT  04/19/2022   IR US GUIDE VASC ACCESS LEFT  04/27/2022   IR US GUIDE VASC ACCESS RIGHT  03/26/2021   IR US GUIDE VASC ACCESS RIGHT  05/13/2021   IR US GUIDE VASC ACCESS RIGHT  05/04/2022   REVISON OF ARTERIOVENOUS FISTULA Left 03/22/2018   Procedure: LEFT UPPER EXTREMITY ARTERIOVENOUS  REVISION WITH GORE-TEX GRAFT.;  Surgeon: Cephus Shelling, MD;  [facility];  Service: Vascular;  Laterality: Left;   TEE WITHOUT CARDIOVERSION N/A 05/12/2021   Procedure: TRANSESOPHAGEAL ECHOCARDIOGRAM (TEE);  Surgeon: Christell Constant, MD;  Location: [facility] ENDOSCOPY;  Service: Cardiovascular;  Laterality: N/A;   THROMBECTOMY Left 08/26/2019   thrombectomy of LUA loop AVG   THROMBECTOMY AND REVISION OF ARTERIOVENTOUS (AV) GORETEX  GRAFT Right 07/27/2020   Procedure: INSERTION OF RIGHT ARM LOOP GRAFT WITH EXCISION OF RIGHT ARM BRACHIAL AXILLARY GRAFT;  Surgeon: Cephus Shelling, MD;  [facility];  Service: Vascular;  Laterality: Right;   THROMBECTOMY BRACHIAL ARTERY Left 05/04/2022   Procedure: LEFT ARM RADIAL BRACHIAL ARTERY EMBOLECTOMY;  Surgeon: Victorino Sparrow, MD;  Location: [facility] OR;  Service: Vascular;  Laterality: Left;   UPPER EXTREMITY VENOGRAPHY Bilateral 06/14/2021   Procedure: UPPER EXTREMITY VENOGRAPHY;  Surgeon: Maeola Harman, MD;  Location: [facility] INVASIVE CV LAB;  Service: Cardiovascular;  Laterality: Bilateral;   VISCERAL ANGIOGRAPHY Right 06/17/2020   Procedure: CENTRAL VENO;  Surgeon: Leonie Douglas, MD;  Location: [facility] INVASIVE CV LAB;  Service: Cardiovascular;  Laterality: Right;   Social History   Socioeconomic History   Marital status: Single    Spouse name: Not on file   Number of children: Not on  file   Years of education: Not on file   Highest education level: Not on file  Occupational History   Not on file  Tobacco Use   Smoking status: Never   Smokeless tobacco: Never  Vaping Use   Vaping Use: Never used  Substance and Sexual Activity   Alcohol use: No   Drug use: Yes    Frequency: 2.0 times per week    Types: Marijuana    Comment: Last use was on 08/17/21   Sexual activity: Yes  Other Topics Concern   Not on file  Social History Narrative   Not on file   Social Determinants of Health   Financial Resource Strain: Not on file  Food Insecurity: No Food Insecurity (09/29/2022)   Hunger Vital Sign    Worried About Running Out of Food in the Last Year: Never true    Ran Out of Food in the Last Year: Never true  Transportation Needs: No Transportation Needs (09/29/2022)   PRAPARE - Transportation    Lack of  Transportation (Medical): No    Lack of Transportation (Non-Medical): No  Physical Activity: Insufficiently Active (07/07/2021)   Exercise Vital Sign    Days of Exercise per Week: 4 days    Minutes of Exercise per Session: 30 min  Stress: Stress Concern Present (08/06/2021)   [employer] of Occupational Health - Occupational Stress Questionnaire    Feeling of Stress : To some extent  Social Connections: Socially Isolated (06/07/2021)   Social Connection and Isolation Panel [NHANES]    Frequency of Communication with Friends and Family: More than three times a week    Frequency of Social Gatherings with Friends and Family: More than three times a week    Attends Religious Services: Never    Database administrator or Organizations: No    Attends Engineer, structural: Never    Marital Status: Never married   Family History  Problem Relation Age of Onset   Heart disease Mother    Hypertension Mother    - negative except otherwise stated in the family history section Allergies  Allergen Reactions   Hydralazine Hcl Other (See Comments)     DRUG-INDUCED LUPUS   Prior to Admission medications   Medication Sig Start Date End Date Taking? Authorizing Provider  acetaminophen (TYLENOL) 500 MG tablet Take 1,000 mg by mouth every 6 (six) hours as needed (for pain.).   Yes [provider]  apixaban (ELIQUIS) 5 MG TABS tablet Take 1 tablet by mouth twice daily 08/04/22  Yes Dameron, Nolberto Hanlon, DO  aspirin EC 81 MG tablet Take 1 tablet (81 mg total) by mouth daily. Swallow whole. 05/11/22  Yes Dameron, Nolberto Hanlon, DO  calcium carbonate (TUMS - DOSED IN MG ELEMENTAL CALCIUM) 500 MG chewable tablet Chew 1,000 mg by mouth daily as needed for indigestion or heartburn.   Yes [provider]  cetirizine (ZYRTEC) 10 MG tablet Take 1 tablet (10 mg total) by mouth daily as needed for allergies. 05/11/22  Yes Dameron, Nolberto Hanlon, DO  cinacalcet (SENSIPAR) 30 MG tablet Take 30 mg by mouth daily. 08/12/21  Yes [provider]  ferric citrate (AURYXIA) 1 GM 210 MG(Fe) tablet Take 2-4 tablets (420-840 mg total) by mouth See admin instructions. Take 4 tablets (840 mg) by mouth with each meal & take 2 tablets (420 mg) by mouth with each snack. 05/11/22  Yes Dameron, Nolberto Hanlon, DO  gabapentin (NEURONTIN) 100 MG capsule TAKE 1 CAPSULE BY MOUTH THREE TIMES DAILY 05/09/22  Yes Maeola Harman, MD  gabapentin (NEURONTIN) 300 MG capsule Take 1 capsule by mouth at bedtime 07/25/22  Yes Dameron, Nolberto Hanlon, DO  HYDROcodone-acetaminophen (NORCO/VICODIN) 5-325 MG tablet Take 1 tablet by mouth every 8 (eight) hours as needed for moderate pain. 09/23/22  Yes Barnie Del R, NP  LANTUS 100 UNIT/ML injection Inject 0.05 mLs (5 Units total) into the skin 2 (two) times daily. 05/11/22  Yes Dameron, Nolberto Hanlon, DO  levothyroxine (SYNTHROID) 150 MCG tablet TAKE 1 TABLET BY MOUTH ONCE DAILY BEFORE BREAKFAST *APPOINTMENT NEEDED FOR FURTHER REFILLS* Patient taking differently: Take 150 mcg by mouth daily before breakfast. 09/26/22  Yes Dameron, Nolberto Hanlon, DO  midodrine  (PROAMATINE) 5 MG tablet Take 1 tablet (5 mg total) by mouth Every Tuesday,Thursday,and Saturday with dialysis. 05/12/22  Yes Dameron, Nolberto Hanlon, DO  multivitamin (RENA-VIT) TABS tablet Take 1 tablet by mouth at bedtime. 05/11/22  Yes Dameron, Nolberto Hanlon, DO  rosuvastatin (CRESTOR) 20 MG tablet Take 1 tablet by mouth once daily 08/23/22  Yes Dameron, Nolberto Hanlon, DO  Accu-Chek Softclix  Lancets lancets Use as instructed Patient not taking: Reported on 09/30/2022 06/11/21   Alicia Amel, MD  Blood Glucose Monitoring Suppl (ACCU-CHEK GUIDE ME) w/Device KIT 1 kit by Does not apply route daily. Patient not taking: Reported on 09/30/2022 05/22/21   Alicia Amel, MD  glucose blood (ACCU-CHEK GUIDE) test strip 1 each by Other route 2 (two) times daily. Use as instructed Patient not taking: Reported on 09/30/2022 09/22/21   Shelby Mattocks, DO  guaiFENesin (ROBITUSSIN) 100 MG/5ML liquid Take 5-10 mLs (100-200 mg total) by mouth every 4 (four) hours as needed for cough or to loosen phlegm. Patient not taking: Reported on 09/30/2022 07/30/22   Cristopher Peru, PA-C  Syringe, Disposable, (B-D SYRINGE SLIP TIP 30CC) 30 ML MISC 1 Syringe by Does not apply route daily. Patient not taking: Reported on 09/30/2022 01/31/19   Melene Plan, MD   MR FOOT LEFT W WO CONTRAST  Result Date: 09/30/2022 CLINICAL DATA:  Left foot infection. EXAM: MRI OF THE LEFT FOREFOOT WITHOUT AND WITH CONTRAST TECHNIQUE: Multiplanar, multisequence MR imaging of the left foot was performed both before and after administration of intravenous contrast. CONTRAST:  10mL GADAVIST GADOBUTROL 1 MMOL/ML IV SOLN COMPARISON:  Left foot x-rays dated Sep 29, 2022. MRI left foot dated May 07, 2021. FINDINGS: Bones/Joint/Cartilage Prior first ray amputation abnormal marrow edema and enhancement involving the residual first metatarsal. 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 Mild  marrow edema and cystic change in the third metatarsal head with subchondral collapse, consistent with avascular necrosis. 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  No joint effusion. Ligaments Second through fifth toe collateral ligaments are intact. Lisfranc ligament is intact. Muscles and Tendons Postsurgical changes of the first flexor and extensor tendons. Remaining flexor and extensor tendons are intact. No tenosynovitis. Increased T2 signal within the intrinsic muscles of the forefoot, nonspecific, but likely related to diabetic muscle changes. Soft tissue Medial plantar ulceration along the forefoot with sinus tract extending to the residual first metatarsal. 1.4 x 1.8 x 1.7 cm bilobed rim enhancing fluid collection at the plantar aspect of the residual first metatarsal tip, communicating with the sinus tract. No soft tissue mass. IMPRESSION: 1. Medial plantar ulceration along the forefoot with sinus tract extending to the residual first metatarsal. Osteomyelitis of the residual first metatarsal with 1.8 cm abscess along the plantar aspect, communicating with the sinus tract. 2. 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 3. 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 Electronically Signed   By: Obie Dredge M.D.   On: 09/30/2022 11:22   DG Foot Complete Left  Result Date: 09/29/2022 CLINICAL DATA:  Infected left foot. Plantar surface distal first metatarsal EXAM: LEFT FOOT - COMPLETE 3 VIEW COMPARISON:  CT 05/06/2021 FINDINGS: There has been prior amputation of the phalanges of the great toe in the distal half of the first metatarsal. At the distal margin of this is a focal area of soft tissue thickening and lucency. Please correlate for location of ulcer. No definite underlying erosive changes of the first metatarsal remnant. If there is further concern of bone infection, follow up bone scan or MRI may be useful for further sensitivity. There is chronic healed  deformity of the distal shaft of the second metatarsal. There is also deformity of the head of third metatarsal which could be the sequela of previous AVN with collapse and lucency. Otherwise no fracture or dislocation. Preserved bone  mineralization. IMPRESSION: Prior first ray amputation with adjacent soft tissue swelling and gas. No definite erosive changes at this time. More chronic healed fracture deformity of the shaft of second metatarsal. 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 These changes were not seen on the prior CT scan. Please correlate with any more recent x-ray to compare assess for subtle change Electronically Signed   By: Karen Kays M.D.   On: 09/29/2022 13:32   - pertinent xrays, CT, MRI studies were reviewed and independently interpreted  Positive ROS: All other systems have been reviewed and were otherwise negative with the exception of those mentioned in the HPI and as above.  Physical Exam: General: Alert, no acute distress Psychiatric: Patient is competent for consent with normal mood and affect Lymphatic: No axillary or cervical lymphadenopathy Cardiovascular: No pedal edema Respiratory: No cyanosis, no use of accessory musculature GI: No organomegaly, abdomen is soft and non-tender    Images:  @ENCIMAGES @  Labs:  Lab Results  Component Value Date   HGBA1C 7.4 (H) 09/30/2022   HGBA1C 6.3 (H) 04/18/2022   HGBA1C 6.2 09/22/2021   REPTSTATUS PENDING 09/29/2022   GRAMSTAIN  05/08/2021    FEW WBC PRESENT, PREDOMINANTLY PMN FEW GRAM POSITIVE COCCI    CULT  09/29/2022    NO GROWTH < 24 HOURS Performed at [facility], [address] [place], [place] [address]    [facility] STAPHYLOCOCCUS AUREUS 05/08/2021    Lab Results  Component Value Date   ALBUMIN 3.3 (L) 09/30/2022   ALBUMIN 4.4 09/29/2022   ALBUMIN 3.7 05/05/2022        Latest Ref Rng & Units 09/30/2022   12:33 AM 09/29/2022   12:19 PM  05/11/2022   11:06 AM  CBC EXTENDED  WBC 4.0 - 10.5 K/uL 7.1  8.8  10.7   RBC 4.22 - 5.81 MIL/uL 5.28  6.18  4.93   Hemoglobin 13.0 - 17.0 g/dL 82.9  56.2  13.0   HCT 39.0 - 52.0 % 43.2  54.8  39.2   Platelets 150 - 400 K/uL 222  219  305     Neurologic: Patient does not have protective sensation bilateral lower extremities.   MUSCULOSKELETAL:   Skin: Examination patient has an ulcer on the medial border of the left foot that extends down to the residual metatarsal.  There is no ascending cellulitis.  There is drainage.  Patient has a palpable dorsalis pedis and posterior tibial pulse.  Ankle-brachial indices shows triphasic flow.  Review of the radiographs shows a stress fracture through the second metatarsal and avascular necrosis of the third metatarsal head.  The MRI scan shows abscess and osteomyelitis of the residual first metatarsal.  Assessment: Assessment: Abscess and osteomyelitis left foot residual first metatarsal  Plan: .  Plan: Will plan for a left foot first ray amputation tomorrow Saturday morning.  Risk and benefits were discussed including risk of the wound not healing need for additional surgery.  Patient states he understands wished to proceed at this time.  Thank you for the consult and the opportunity to see Mr. Meeko Boers, MD [facility] [phone] 3:29 PM

## 2022-09-30 NOTE — Consult Note (Signed)
WOC Nurse Consult Note: Reason for Consult: left foot wound Patient followed by orthopedics outpatient. They have been consulted, pending  Wound type: neuropathic  Pressure Injury POA: NA Measurement:see nursing flow sheets Wound bed:100% clean, minimal fibrinous tissue in the base, pale, non granular Drainage (amount, consistency, odor) documented as scant, but malodorous.  Periwound: charcot foot deformity, epibole, hyperkeratosis  Dressing procedure/placement/frequency: Cut to fit silver hydrofiber and pack wound bed, cover with foam or dry dressings. Change daily.   Re consult if needed, will not follow at this time. Thanks  Jaison Petraglia [employer], RN,CWOCN, CNS, CWON-AP [phone])

## 2022-09-30 NOTE — Consult Note (Signed)
  ORTHOPAEDIC CONSULTATION  REQUESTING PHYSICIAN: Chambliss, Marshall L, MD  Chief Complaint: Fever chills and chronic ulcer left foot  HPI: Paul [name] is a 32 y.o. 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  Patient states that recently he has developed fever and chills that led him for admission.  Patient has type 2 diabetes end-stage renal disease on dialysis.  Patient is 2 years out from his left foot first ray amputation.  Past Medical History:  Diagnosis Date   Acute hypoxemic respiratory failure (HCC) 09/11/2016   Anemia    Chronic kidney disease    ARF on CRF Dialysis T/TH/Sa   Diabetes mellitus    Type II   End stage renal disease on dialysis (HCC)    [place]    GERD (gastroesophageal reflux disease)    diet controlled   HLD (hyperlipidemia)    Hypertension    Hypothyroidism    Morbid obesity (HCC)    Sacral wound    resolved   Thyroid disease    Past Surgical History:  Procedure Laterality Date   AMPUTATION Left 05/08/2021   Procedure: AMPUTATION GREAT TOE;  Surgeon: Rakan Soffer, Gerasimos V, MD;  Location: [facility];  Service: Orthopedics;  Laterality: Left;   AV FISTULA PLACEMENT Left 09/26/2016   Procedure: LEFT UPPER ARM ARTERIOVENOUS (AV) FISTULA CREATION;  Surgeon: Fields, Charles E, MD;  Location: [facility];  Service: Vascular;  Laterality: Left;   AV FISTULA PLACEMENT Left 12/02/2016   Procedure: INSERTION OF ARTERIOVENOUS GORE-TEX GRAFT LEFT UPPER  ARM USING A 4-7MM BY 45CM GRAFT ;  Surgeon: Early, Todd F, MD;  [facility]: [facility];  Service: Vascular;  Laterality: Left;   AV FISTULA PLACEMENT Right 07/03/2020   Procedure: INSERTION OF ARTERIOVENOUS (AV) GORE-TEX GRAFT RIGHT UPPER ARM;  Surgeon: Clark, Christopher J, MD;  [facility];  Service: Vascular;  Laterality: Right;   AV FISTULA PLACEMENT Left 07/02/2021   Procedure: LEFT UPPER EXTREMITY ARTERIOVENOUS FISTULA CREATION;  Surgeon: Cain, Brandon Christopher, MD;   [facility];  Service: Vascular;  Laterality: Left;   BASCILIC VEIN TRANSPOSITION Right 05/06/2020   Procedure: FIRST STAGE RIGHT BASCILIC VEIN TRANSPOSITION;  Surgeon: Brabham, Vance W, MD;  [facility];  Service: Vascular;  Laterality: Right;   BASCILIC VEIN TRANSPOSITION Left 08/20/2021   Procedure: LEFT ARM SECOND STAGE WITH ARTERIOVENOUS GRAFT;  Surgeon: Cain, Brandon Christopher, MD;  [facility];  Service: Vascular;  Laterality: Left;   EXCHANGE OF A DIALYSIS CATHETER Right 10/08/2016   Procedure: EXCHANGE OF A DIALYSIS CATHETER-RIGHT INTERNAL JUGULAR;  Surgeon: Chen, Brian L, MD;  [facility];  Service: Vascular;  Laterality: Right;   INSERTION OF DIALYSIS CATHETER Right 09/26/2016   Procedure: INSERTION OF DIALYSIS CATHETER - Right Internal Jugular Placement;  Surgeon: Fields, Charles E, MD;  [facility];  Service: Vascular;  Laterality: Right;   INSERTION OF DIALYSIS CATHETER Left 03/22/2018   Procedure: INSERTION OF DIALYSIS CATHETER;  Surgeon: Clark, Christopher J, MD;  [facility];  Service: Vascular;  Laterality: Left;   INTRAOPERATIVE ARTERIOGRAM Left 05/04/2022   Procedure: INTRA OPERATIVE ARTERIOGRAM;  Surgeon: Robins, Joshua E, MD;  [facility];  Service: Vascular;  Laterality: Left;   IR AV DIALY SHUNT INTRO NEEDLE/INTRACATH INITIAL W/PTA/IMG LEFT  08/19/2022   IR DIALY SHUNT INTRO NEEDLE/INTRACATH INITIAL W/IMG LEFT Left 04/15/2022   IR FLUORO GUIDE CV LINE LEFT  04/19/2022   IR FLUORO GUIDE CV LINE LEFT    05/30/2022   IR FLUORO GUIDE CV LINE RIGHT  03/26/2021   IR FLUORO GUIDE CV LINE RIGHT  05/13/2021   IR FLUORO GUIDE CV LINE RIGHT  05/04/2022   IR REMOVAL TUN CV CATH W/O FL  05/11/2021   IR REMOVAL TUN CV CATH W/O FL  10/06/2021   IR THROMBECTOMY AV FISTULA W/THROMBOLYSIS/PTA INC/SHUNT/IMG LEFT Left 02/02/2022   IR THROMBECTOMY AV FISTULA W/THROMBOLYSIS/PTA INC/SHUNT/IMG LEFT Left 03/22/2022   IR THROMBECTOMY AV FISTULA W/THROMBOLYSIS/PTA INC/SHUNT/IMG  LEFT Left 05/04/2022   IR THROMBECTOMY AV FISTULA W/THROMBOLYSIS/PTA/STENT INC/SHUNT/IMG LT Left 04/27/2022   IR US GUIDE VASC ACCESS LEFT  02/02/2022   IR US GUIDE VASC ACCESS LEFT  03/22/2022   IR US GUIDE VASC ACCESS LEFT  04/15/2022   IR US GUIDE VASC ACCESS LEFT  04/19/2022   IR US GUIDE VASC ACCESS LEFT  04/27/2022   IR US GUIDE VASC ACCESS RIGHT  03/26/2021   IR US GUIDE VASC ACCESS RIGHT  05/13/2021   IR US GUIDE VASC ACCESS RIGHT  05/04/2022   REVISON OF ARTERIOVENOUS FISTULA Left 03/22/2018   Procedure: LEFT UPPER EXTREMITY ARTERIOVENOUS  REVISION WITH GORE-TEX GRAFT.;  Surgeon: Clark, Christopher J, MD;  [facility];  Service: Vascular;  Laterality: Left;   TEE WITHOUT CARDIOVERSION N/A 05/12/2021   Procedure: TRANSESOPHAGEAL ECHOCARDIOGRAM (TEE);  Surgeon: Chandrasekhar, Mahesh A, MD;  [facility];  Service: Cardiovascular;  Laterality: N/A;   THROMBECTOMY Left 08/26/2019   thrombectomy of LUA loop AVG   THROMBECTOMY AND REVISION OF ARTERIOVENTOUS (AV) GORETEX  GRAFT Right 07/27/2020   Procedure: INSERTION OF RIGHT ARM LOOP GRAFT WITH EXCISION OF RIGHT ARM BRACHIAL AXILLARY GRAFT;  Surgeon: Clark, Christopher J, MD;  [facility];  Service: Vascular;  Laterality: Right;   THROMBECTOMY BRACHIAL ARTERY Left 05/04/2022   Procedure: LEFT ARM RADIAL BRACHIAL ARTERY EMBOLECTOMY;  Surgeon: Robins, Joshua E, MD;  [facility];  Service: Vascular;  Laterality: Left;   UPPER EXTREMITY VENOGRAPHY Bilateral 06/14/2021   Procedure: UPPER EXTREMITY VENOGRAPHY;  Surgeon: Cain, Brandon Christopher, MD;  [facility];  Service: Cardiovascular;  Laterality: Bilateral;   VISCERAL ANGIOGRAPHY Right 06/17/2020   Procedure: CENTRAL VENO;  Surgeon: Hawken, Thomas N, MD;  [facility];  Service: Cardiovascular;  Laterality: Right;   Social History   Socioeconomic History   Marital status: Single    Spouse name: Not on file   Number of children: Not on  file   Years of education: Not on file   Highest education level: Not on file  Occupational History   Not on file  Tobacco Use   Smoking status: Never   Smokeless tobacco: Never  Vaping Use   Vaping Use: Never used  Substance and Sexual Activity   Alcohol use: No   Drug use: Yes    Frequency: 2.0 times per week    Types: Marijuana    Comment: Last use was on 08/17/21   Sexual activity: Yes  Other Topics Concern   Not on file  Social History Narrative   Not on file   Social Determinants of Health   Financial Resource Strain: Not on file  Food Insecurity: No Food Insecurity (09/29/2022)   Hunger Vital Sign    Worried About Running Out of Food in the Last Year: Never true    Ran Out of Food in the Last Year: Never true  Transportation Needs: No Transportation Needs (09/29/2022)   PRAPARE - Transportation    Lack of   Transportation (Medical): No    Lack of Transportation (Non-Medical): No  Physical Activity: Insufficiently Active (07/07/2021)   Exercise Vital Sign    Days of Exercise per Week: 4 days    Minutes of Exercise per Session: 30 min  Stress: Stress Concern Present (08/06/2021)   [employer] - Occupational Stress Questionnaire    Feeling of Stress : To some extent  Social Connections: Socially Isolated (06/07/2021)   Social Connection and Isolation Panel [NHANES]    Frequency of Communication with Friends and Family: More than three times a week    Frequency of Social Gatherings with Friends and Family: More than three times a week    Attends Religious Services: Never    Active Member of Clubs or Organizations: No    Attends Club or Organization Meetings: Never    Marital Status: Never married   Family History  Problem Relation Age of Onset   Heart disease Mother    Hypertension Mother    - negative except otherwise stated in the family history section Allergies  Allergen Reactions   Hydralazine Hcl Other (See Comments)     DRUG-INDUCED LUPUS   Prior to Admission medications   Medication Sig Start Date End Date Taking? Authorizing Provider  acetaminophen (TYLENOL) 500 MG tablet Take 1,000 mg by mouth every 6 (six) hours as needed (for pain.).   Yes [provider]  apixaban (ELIQUIS) 5 MG TABS tablet Take 1 tablet by mouth twice daily 08/04/22  Yes Dameron, Marisa, DO  aspirin EC 81 MG tablet Take 1 tablet (81 mg total) by mouth daily. Swallow whole. 05/11/22  Yes Dameron, Marisa, DO  calcium carbonate (TUMS - DOSED IN MG ELEMENTAL CALCIUM) 500 MG chewable tablet Chew 1,000 mg by mouth daily as needed for indigestion or heartburn.   Yes [provider]  cetirizine (ZYRTEC) 10 MG tablet Take 1 tablet (10 mg total) by mouth daily as needed for allergies. 05/11/22  Yes Dameron, Marisa, DO  cinacalcet (SENSIPAR) 30 MG tablet Take 30 mg by mouth daily. 08/12/21  Yes [provider]  ferric citrate (AURYXIA) 1 GM 210 MG(Fe) tablet Take 2-4 tablets (420-840 mg total) by mouth See admin instructions. Take 4 tablets (840 mg) by mouth with each meal & take 2 tablets (420 mg) by mouth with each snack. 05/11/22  Yes Dameron, Marisa, DO  gabapentin (NEURONTIN) 100 MG capsule TAKE 1 CAPSULE BY MOUTH THREE TIMES DAILY 05/09/22  Yes Cain, Brandon Christopher, MD  gabapentin (NEURONTIN) 300 MG capsule Take 1 capsule by mouth at bedtime 07/25/22  Yes Dameron, Marisa, DO  HYDROcodone-acetaminophen (NORCO/VICODIN) 5-325 MG tablet Take 1 tablet by mouth every 8 (eight) hours as needed for moderate pain. 09/23/22  Yes Zamora, Erin R, NP  LANTUS 100 UNIT/ML injection Inject 0.05 mLs (5 Units total) into the skin 2 (two) times daily. 05/11/22  Yes Dameron, Marisa, DO  levothyroxine (SYNTHROID) 150 MCG tablet TAKE 1 TABLET BY MOUTH ONCE DAILY BEFORE BREAKFAST *APPOINTMENT NEEDED FOR FURTHER REFILLS* Patient taking differently: Take 150 mcg by mouth daily before breakfast. 09/26/22  Yes Dameron, Marisa, DO  midodrine  (PROAMATINE) 5 MG tablet Take 1 tablet (5 mg total) by mouth Every Tuesday,Thursday,and Saturday with dialysis. 05/12/22  Yes Dameron, Marisa, DO  multivitamin (RENA-VIT) TABS tablet Take 1 tablet by mouth at bedtime. 05/11/22  Yes Dameron, Marisa, DO  rosuvastatin (CRESTOR) 20 MG tablet Take 1 tablet by mouth once daily 08/23/22  Yes Dameron, Marisa, DO  Accu-Chek Softclix   Lancets lancets Use as instructed Patient not taking: Reported on 09/30/2022 06/11/21   Sanford, James B, MD  Blood Glucose Monitoring Suppl (ACCU-CHEK GUIDE ME) w/Device KIT 1 kit by Does not apply route daily. Patient not taking: Reported on 09/30/2022 05/22/21   Sanford, James B, MD  glucose blood (ACCU-CHEK GUIDE) test strip 1 each by Other route 2 (two) times daily. Use as instructed Patient not taking: Reported on 09/30/2022 09/22/21   Dahbura, Anton, DO  guaiFENesin (ROBITUSSIN) 100 MG/5ML liquid Take 5-10 mLs (100-200 mg total) by mouth every 4 (four) hours as needed for cough or to loosen phlegm. Patient not taking: Reported on 09/30/2022 07/30/22   Autry, Lauren E, PA-C  Syringe, Disposable, (B-D SYRINGE SLIP TIP 30CC) 30 ML MISC 1 Syringe by Does not apply route daily. Patient not taking: Reported on 09/30/2022 01/31/19   Kim, Rachel E, MD   MR FOOT LEFT W WO CONTRAST  Result Date: 09/30/2022 CLINICAL DATA:  Left foot infection. EXAM: MRI OF THE LEFT FOREFOOT WITHOUT AND WITH CONTRAST TECHNIQUE: Multiplanar, multisequence MR imaging of the left foot was performed both before and after administration of intravenous contrast. CONTRAST:  10mL GADAVIST GADOBUTROL 1 MMOL/ML IV SOLN COMPARISON:  Left foot x-rays dated Sep 29, 2022. MRI left foot dated May 07, 2021. FINDINGS: Bones/Joint/Cartilage Prior first ray amputation abnormal marrow edema and enhancement involving the residual first metatarsal. 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 Mild  marrow edema and cystic change in the third metatarsal head with subchondral collapse, consistent with avascular necrosis. 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  No joint effusion. Ligaments Second through fifth toe collateral ligaments are intact. Lisfranc ligament is intact. Muscles and Tendons Postsurgical changes of the first flexor and extensor tendons. Remaining flexor and extensor tendons are intact. No tenosynovitis. Increased T2 signal within the intrinsic muscles of the forefoot, nonspecific, but likely related to diabetic muscle changes. Soft tissue Medial plantar ulceration along the forefoot with sinus tract extending to the residual first metatarsal. 1.4 x 1.8 x 1.7 cm bilobed rim enhancing fluid collection at the plantar aspect of the residual first metatarsal tip, communicating with the sinus tract. No soft tissue mass. IMPRESSION: 1. Medial plantar ulceration along the forefoot with sinus tract extending to the residual first metatarsal. Osteomyelitis of the residual first metatarsal with 1.8 cm abscess along the plantar aspect, communicating with the sinus tract. 2. 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 3. 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 Electronically Signed   By: William T Derry M.D.   On: 09/30/2022 11:22   DG Foot Complete Left  Result Date: 09/29/2022 CLINICAL DATA:  Infected left foot. Plantar surface distal first metatarsal EXAM: LEFT FOOT - COMPLETE 3 VIEW COMPARISON:  CT 05/06/2021 FINDINGS: There has been prior amputation of the phalanges of the great toe in the distal half of the first metatarsal. At the distal margin of this is a focal area of soft tissue thickening and lucency. Please correlate for location of ulcer. No definite underlying erosive changes of the first metatarsal remnant. If there is further concern of bone infection, follow up bone scan or MRI may be useful for further sensitivity. There is chronic healed  deformity of the distal shaft of the second metatarsal. There is also deformity of the head of third metatarsal which could be the sequela of previous AVN with collapse and lucency. Otherwise no fracture or dislocation. Preserved bone   mineralization. IMPRESSION: Prior first ray amputation with adjacent soft tissue swelling and gas. No definite erosive changes at this time. More chronic healed fracture deformity of the shaft of second metatarsal. 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 These changes were not seen on the prior CT scan. Please correlate with any more recent x-ray to compare assess for subtle change Electronically Signed   By: Ashok  Gupta M.D.   On: 09/29/2022 13:32   - pertinent xrays, CT, MRI studies were reviewed and independently interpreted  Positive ROS: All other systems have been reviewed and were otherwise negative with the exception of those mentioned in the HPI and as above.  Physical Exam: General: Alert, no acute distress Psychiatric: Patient is competent for consent with normal mood and affect Lymphatic: No axillary or cervical lymphadenopathy Cardiovascular: No pedal edema Respiratory: No cyanosis, no use of accessory musculature GI: No organomegaly, abdomen is soft and non-tender    Images:  @ENCIMAGES@  Labs:  Lab Results  Component Value Date   HGBA1C 7.4 (H) 09/30/2022   HGBA1C 6.3 (H) 04/18/2022   HGBA1C 6.2 09/22/2021   REPTSTATUS PENDING 09/29/2022   GRAMSTAIN  05/08/2021    FEW WBC PRESENT, PREDOMINANTLY PMN FEW GRAM POSITIVE COCCI    CULT  09/29/2022    NO GROWTH < 24 HOURS Performed at [facility], [address] [place], [place] [address]    LABORGA STAPHYLOCOCCUS AUREUS 05/08/2021    Lab Results  Component Value Date   ALBUMIN 3.3 (L) 09/30/2022   ALBUMIN 4.4 09/29/2022   ALBUMIN 3.7 05/05/2022        Latest Ref Rng & Units 09/30/2022   12:33 AM 09/29/2022   12:19 PM  05/11/2022   11:06 AM  CBC EXTENDED  WBC 4.0 - 10.5 K/uL 7.1  8.8  10.7   RBC 4.22 - 5.81 MIL/uL 5.28  6.18  4.93   Hemoglobin 13.0 - 17.0 g/dL 14.4  16.6  13.2   HCT 39.0 - 52.0 % 43.2  54.8  39.2   Platelets 150 - 400 K/uL 222  219  305     Neurologic: Patient does not have protective sensation bilateral lower extremities.   MUSCULOSKELETAL:   Skin: Examination patient has an ulcer on the medial border of the left foot that extends down to the residual metatarsal.  There is no ascending cellulitis.  There is drainage.  Patient has a palpable dorsalis pedis and posterior tibial pulse.  Ankle-brachial indices shows triphasic flow.  Review of the radiographs shows a stress fracture through the second metatarsal and avascular necrosis of the third metatarsal head.  The MRI scan shows abscess and osteomyelitis of the residual first metatarsal.  Assessment: Assessment: Abscess and osteomyelitis left foot residual first metatarsal  Plan: .  Plan: Will plan for a left foot first ray amputation tomorrow Saturday morning.  Risk and benefits were discussed including risk of the wound not healing need for additional surgery.  Patient states he understands wished to proceed at this time.  Thank you for the consult and the opportunity to see Mr. Volcy  Caesar Abrey Bradway, MD [facility] [phone] 3:29 PM     

## 2022-09-30 NOTE — Consult Note (Addendum)
[facility] Renal Consultation Note    Indication for Consultation:  Management of ESRD/hemodialysis, anemia, hypertension/volume, and secondary hyperparathyroidism. PCP:  HPI: Paul [name] is a 32 y.o. male with ESRD, T2DM, obesity, and L foot wound who was admitted with sepsis.  Presented to ED 09/29/22 with malaise, fever, chills, N/V for previous 3 days. Has chronic L foot wound and had noticed some increased drainage. No CP, dyspnea, cough, congestion. Vitals with T101F, BP 100/67. Labs with Na 133, K 3.8, CO2 20. Ca 9.6, LA 3.4 -> 1.3, WBC 8.8, Hgb 16.6 -> 14.4, Plts 219, A1c 7.4%. Started on broad-spectrum abx for his foot. MRI performed today, results pending.  Today, he feels a little better - afebrile now. No CP or overt dyspnea. No abd pain, N/V/D.  Dialyzes on TTS schedule at [facility] via LUE AVG. He did dialyze 5/9 prior to admit, left early because he was feeling so poorly. Due for HD tomorrow.  Past Medical History:  Diagnosis Date   Acute hypoxemic respiratory failure (HCC) 09/11/2016   Anemia    Chronic kidney disease    ARF on CRF Dialysis T/TH/Sa   Diabetes mellitus    Type II   End stage renal disease on dialysis (HCC)    [place]    GERD (gastroesophageal reflux disease)    diet controlled   HLD (hyperlipidemia)    Hypertension    Hypothyroidism    Morbid obesity (HCC)    Sacral wound    resolved   Thyroid disease    Past Surgical History:  Procedure Laterality Date   AMPUTATION Left 05/08/2021   Procedure: AMPUTATION GREAT TOE;  Surgeon: Nadara Mustard, MD;  [facility];  Service: Orthopedics;  Laterality: Left;   AV FISTULA PLACEMENT Left 09/26/2016   Procedure: LEFT UPPER ARM ARTERIOVENOUS (AV) FISTULA CREATION;  Surgeon: Sherren Kerns, MD;  Location: [facility] OR;  Service: Vascular;  Laterality: Left;   AV FISTULA PLACEMENT Left 12/02/2016   Procedure: INSERTION OF ARTERIOVENOUS GORE-TEX GRAFT LEFT UPPER  ARM USING A 4-7MM BY  45CM GRAFT ;  Surgeon: Larina Earthly, MD;  Location: [facility] OR;  Service: Vascular;  Laterality: Left;   AV FISTULA PLACEMENT Right 07/03/2020   Procedure: INSERTION OF ARTERIOVENOUS (AV) GORE-TEX GRAFT RIGHT UPPER ARM;  Surgeon: Cephus Shelling, MD;  Location: [facility] OR;  Service: Vascular;  Laterality: Right;   AV FISTULA PLACEMENT Left 07/02/2021   Procedure: LEFT UPPER EXTREMITY ARTERIOVENOUS FISTULA CREATION;  Surgeon: Maeola Harman, MD;  Location: [facility] OR;  Service: Vascular;  Laterality: Left;   BASCILIC VEIN TRANSPOSITION Right 05/06/2020   Procedure: FIRST STAGE RIGHT BASCILIC VEIN TRANSPOSITION;  Surgeon: Nada Libman, MD;  Location: [facility] OR;  Service: Vascular;  Laterality: Right;   BASCILIC VEIN TRANSPOSITION Left 08/20/2021   Procedure: LEFT ARM SECOND STAGE WITH ARTERIOVENOUS GRAFT;  Surgeon: Maeola Harman, MD;  Location: [facility] OR;  Service: Vascular;  Laterality: Left;   EXCHANGE OF A DIALYSIS CATHETER Right 10/08/2016   Procedure: EXCHANGE OF A DIALYSIS CATHETER-RIGHT INTERNAL JUGULAR;  Surgeon: Fransisco Hertz, MD;  [facility] OR;  Service: Vascular;  Laterality: Right;   INSERTION OF DIALYSIS CATHETER Right 09/26/2016   Procedure: INSERTION OF DIALYSIS CATHETER - Right Internal Jugular Placement;  Surgeon: Sherren Kerns, MD;  Location: [facility] OR;  Service: Vascular;  Laterality: Right;   INSERTION OF DIALYSIS CATHETER Left 03/22/2018   Procedure: INSERTION OF DIALYSIS CATHETER;  Surgeon: Cephus Shelling, MD;  Location: [facility] OR;  Service: Vascular;  Laterality: Left;   INTRAOPERATIVE ARTERIOGRAM Left 05/04/2022   Procedure: INTRA OPERATIVE ARTERIOGRAM;  Surgeon: Victorino Sparrow, MD;  Location: [facility] OR;  Service: Vascular;  Laterality: Left;   IR AV DIALY SHUNT INTRO NEEDLE/INTRACATH INITIAL W/PTA/IMG LEFT  08/19/2022   IR DIALY SHUNT INTRO NEEDLE/INTRACATH INITIAL W/IMG LEFT Left 04/15/2022   IR FLUORO GUIDE CV LINE LEFT  04/19/2022   IR FLUORO GUIDE CV LINE LEFT   05/30/2022   IR FLUORO GUIDE CV LINE RIGHT  03/26/2021   IR FLUORO GUIDE CV LINE RIGHT  05/13/2021   IR FLUORO GUIDE CV LINE RIGHT  05/04/2022   IR REMOVAL TUN CV CATH W/O FL  05/11/2021   IR REMOVAL TUN CV CATH W/O FL  10/06/2021   IR THROMBECTOMY AV FISTULA W/THROMBOLYSIS/PTA INC/SHUNT/IMG LEFT Left 02/02/2022   IR THROMBECTOMY AV FISTULA W/THROMBOLYSIS/PTA INC/SHUNT/IMG LEFT Left 03/22/2022   IR THROMBECTOMY AV FISTULA W/THROMBOLYSIS/PTA INC/SHUNT/IMG LEFT Left 05/04/2022   IR THROMBECTOMY AV FISTULA W/THROMBOLYSIS/PTA/STENT INC/SHUNT/IMG LT Left 04/27/2022   IR US GUIDE VASC ACCESS LEFT  02/02/2022   IR US GUIDE VASC ACCESS LEFT  03/22/2022   IR US GUIDE VASC ACCESS LEFT  04/15/2022   IR US GUIDE VASC ACCESS LEFT  04/19/2022   IR US GUIDE VASC ACCESS LEFT  04/27/2022   IR US GUIDE VASC ACCESS RIGHT  03/26/2021   IR US GUIDE VASC ACCESS RIGHT  05/13/2021   IR US GUIDE VASC ACCESS RIGHT  05/04/2022   REVISON OF ARTERIOVENOUS FISTULA Left 03/22/2018   Procedure: LEFT UPPER EXTREMITY ARTERIOVENOUS  REVISION WITH GORE-TEX GRAFT.;  Surgeon: Cephus Shelling, MD;  Location: [facility] OR;  Service: Vascular;  Laterality: Left;   TEE WITHOUT CARDIOVERSION N/A 05/12/2021   Procedure: TRANSESOPHAGEAL ECHOCARDIOGRAM (TEE);  Surgeon: Christell Constant, MD;  Location: [facility] ENDOSCOPY;  Service: Cardiovascular;  Laterality: N/A;   THROMBECTOMY Left 08/26/2019   thrombectomy of LUA loop AVG   THROMBECTOMY AND REVISION OF ARTERIOVENTOUS (AV) GORETEX  GRAFT Right 07/27/2020   Procedure: INSERTION OF RIGHT ARM LOOP GRAFT WITH EXCISION OF RIGHT ARM BRACHIAL AXILLARY GRAFT;  Surgeon: Cephus Shelling, MD;  [facility] OR;  Service: Vascular;  Laterality: Right;   THROMBECTOMY BRACHIAL ARTERY Left 05/04/2022   Procedure: LEFT ARM RADIAL BRACHIAL ARTERY EMBOLECTOMY;  Surgeon: Victorino Sparrow, MD;  Location: [facility] OR;  Service: Vascular;  Laterality: Left;   UPPER EXTREMITY VENOGRAPHY Bilateral 06/14/2021    Procedure: UPPER EXTREMITY VENOGRAPHY;  Surgeon: Maeola Harman, MD;  Location: [facility] INVASIVE CV LAB;  Service: Cardiovascular;  Laterality: Bilateral;   VISCERAL ANGIOGRAPHY Right 06/17/2020   Procedure: CENTRAL VENO;  Surgeon: Leonie Douglas, MD;  Location: [facility] INVASIVE CV LAB;  Service: Cardiovascular;  Laterality: Right;   Family History  Problem Relation Age of Onset   Heart disease Mother    Hypertension Mother    Social History:  reports that he has never smoked. He has never used smokeless tobacco. He reports current drug use. Frequency: 2.00 times per week. Drug: Marijuana. He reports that he does not drink alcohol.  ROS: As per HPI otherwise negative.  Physical Exam: Vitals:   09/30/22 0426 09/30/22 0453 09/30/22 0726 09/30/22 0924  BP: (!) 97/48 (!) 103/49  (!) 112/41  Pulse: 98   91  Resp: 18   18  Temp: 98.5 F (36.9 C)  98.6 F (37 C) 98.3 F (36.8 C)  TempSrc: Oral  Oral Oral  SpO2: 98%  98%  Weight:         General: Well developed, well nourished, in no acute distress. Room air. Head: Normocephalic, atraumatic, sclera non-icteric, mucus membranes are moist. Neck: Supple without lymphadenopathy/masses. JVD not elevated. Lungs: Clear bilaterally to auscultation without wheezes, rales, or rhonchi.  Heart: RRR with normal S1, S2. No murmurs, rubs, or gallops appreciated. Abdomen: Soft, non-tender, non-distended with normoactive bowel sounds. No rebound/guarding. No obvious abdominal masses. Musculoskeletal:  Strength and tone appear normal for age. Lower extremities: No edema. L foot bandaged - not unwrapped myself. Neuro: Alert and oriented X 3. Moves all extremities spontaneously. Psych:  Responds to questions appropriately with a normal affect. Dialysis Access: LUE AVG + bruit  Allergies  Allergen Reactions   Hydralazine Hcl Other (See Comments)    DRUG-INDUCED LUPUS   Prior to Admission medications   Medication Sig Start Date End Date Taking?  Authorizing Provider  Accu-Chek Softclix Lancets lancets Use as instructed 06/11/21   Alicia Amel, MD  acetaminophen (TYLENOL) 500 MG tablet Take 1,000 mg by mouth every 6 (six) hours as needed (for pain.).    [provider]  apixaban (ELIQUIS) 5 MG TABS tablet Take 1 tablet by mouth twice daily 08/04/22   Dameron, Nolberto Hanlon, DO  aspirin EC 81 MG tablet Take 1 tablet (81 mg total) by mouth daily. Swallow whole. 05/11/22   Dameron, Nolberto Hanlon, DO  Blood Glucose Monitoring Suppl (ACCU-CHEK GUIDE ME) w/Device KIT 1 kit by Does not apply route daily. 05/22/21   Alicia Amel, MD  calcium carbonate (TUMS - DOSED IN MG ELEMENTAL CALCIUM) 500 MG chewable tablet Chew 1,000 mg by mouth daily as needed for indigestion or heartburn.    [provider]  cetirizine (ZYRTEC) 10 MG tablet Take 1 tablet (10 mg total) by mouth daily as needed for allergies. 05/11/22   Dameron, Nolberto Hanlon, DO  cinacalcet (SENSIPAR) 30 MG tablet Take 30 mg by mouth daily. 08/12/21   [provider]  ferric citrate (AURYXIA) 1 GM 210 MG(Fe) tablet Take 2-4 tablets (420-840 mg total) by mouth See admin instructions. Take 4 tablets (840 mg) by mouth with each meal & take 2 tablets (420 mg) by mouth with each snack. 05/11/22   Dameron, Nolberto Hanlon, DO  gabapentin (NEURONTIN) 100 MG capsule TAKE 1 CAPSULE BY MOUTH THREE TIMES DAILY 05/09/22   Maeola Harman, MD  gabapentin (NEURONTIN) 300 MG capsule Take 1 capsule by mouth at bedtime 07/25/22   Dameron, Nolberto Hanlon, DO  glucose blood (ACCU-CHEK GUIDE) test strip 1 each by Other route 2 (two) times daily. Use as instructed 09/22/21   Shelby Mattocks, DO  guaiFENesin (ROBITUSSIN) 100 MG/5ML liquid Take 5-10 mLs (100-200 mg total) by mouth every 4 (four) hours as needed for cough or to loosen phlegm. 07/30/22   Cristopher Peru, PA-C  HYDROcodone-acetaminophen (NORCO/VICODIN) 5-325 MG tablet Take 1 tablet by mouth every 8 (eight) hours as needed for moderate pain. 09/23/22   Adonis Huguenin, NP  LANTUS 100 UNIT/ML injection Inject 0.05 mLs (5 Units total) into the skin 2 (two) times daily. 05/11/22   Dameron, Nolberto Hanlon, DO  levothyroxine (SYNTHROID) 150 MCG tablet TAKE 1 TABLET BY MOUTH ONCE DAILY BEFORE BREAKFAST *APPOINTMENT NEEDED FOR FURTHER REFILLS* Patient taking differently: Take 150 mcg by mouth daily before breakfast. 09/26/22   Dameron, Nolberto Hanlon, DO  midodrine (PROAMATINE) 5 MG tablet Take 1 tablet (5 mg total) by mouth Every Tuesday,Thursday,and Saturday with dialysis. 05/12/22   Dameron, Nolberto Hanlon, DO  multivitamin (  RENA-VIT) TABS tablet Take 1 tablet by mouth at bedtime. 05/11/22   Dameron, Nolberto Hanlon, DO  rosuvastatin (CRESTOR) 20 MG tablet Take 1 tablet by mouth once daily 08/23/22   Dameron, Nolberto Hanlon, DO  Syringe, Disposable, (B-D SYRINGE SLIP TIP 30CC) 30 ML MISC 1 Syringe by Does not apply route daily. 01/31/19   Melene Plan, MD   Current Facility-Administered Medications  Medication Dose Route Frequency Provider Last Rate Last Admin   acetaminophen (TYLENOL) suppository 650 mg  650 mg Rectal Q6H PRN Tiffany Kocher, DO       acetaminophen (TYLENOL) tablet 1,000 mg  1,000 mg Oral Q6H PRN Arby Barrette, MD   1,000 mg at 09/30/22 4098   apixaban (ELIQUIS) tablet 5 mg  5 mg Oral BID Tiffany Kocher, DO   5 mg at 09/30/22 1191   calcium carbonate (TUMS - dosed in mg elemental calcium) chewable tablet 1,000 mg  1,000 mg Oral Daily PRN Tiffany Kocher, DO       cefTRIAXone (ROCEPHIN) 2 g in sodium chloride 0.9 % 100 mL IVPB  2 g Intravenous Q24H Elberta Fortis, MD       cinacalcet [facility]) tablet 30 mg  30 mg Oral Daily Tiffany Kocher, DO   30 mg at 09/30/22 4782   ferric citrate (AURYXIA) tablet 840 mg  840 mg Oral TID with meals Tiffany Kocher, DO   840 mg at 09/29/22 1850   gabapentin (NEURONTIN) capsule 100 mg  100 mg Oral TID Tiffany Kocher, DO   100 mg at 09/30/22 0800   gabapentin (NEURONTIN) capsule 300 mg  300 mg Oral QHS Tiffany Kocher, DO   300 mg at 09/29/22  2206   insulin aspart (novoLOG) injection 0-6 Units  0-6 Units Subcutaneous TID WC Alfredo Martinez, MD   1 Units at 09/30/22 9562   levothyroxine (SYNTHROID) tablet 150 mcg  150 mcg Oral QAC breakfast Tiffany Kocher, DO       [START ON 10/01/2022] midodrine (PROAMATINE) tablet 5 mg  5 mg Oral Q T,Th,Sa-HD Tiffany Kocher, DO       multivitamin (RENA-VIT) tablet 1 tablet  1 tablet Oral QHS Tiffany Kocher, DO   1 tablet at 09/29/22 2044   ondansetron (ZOFRAN-ODT) disintegrating tablet 4 mg  4 mg Oral Once PRN Alfredo Martinez, MD       rosuvastatin (CRESTOR) tablet 20 mg  20 mg Oral Daily Tiffany Kocher, DO   20 mg at 09/30/22 0953   [START ON 10/01/2022] vancomycin (VANCOREADY) IVPB 1500 mg/300 mL  1,500 mg Intravenous Q T,Th,Sa-HD Doristine Counter, [facility]       Labs: Basic Metabolic Panel: Recent Labs  Lab 09/29/22 1219 09/30/22 0033  NA 133* 135  K 3.8 3.6  CL 93* 93*  CO2 20* 24  GLUCOSE 159* 187*  BUN 28* 40*  CREATININE 11.31* 12.87*  CALCIUM 9.6 9.0  PHOS  --  5.1*   Liver Function Tests: Recent Labs  Lab 09/29/22 1219 09/30/22 0033  AST 34  --   ALT 23  --   ALKPHOS 72  --   BILITOT 0.8  --   PROT 9.2*  --   ALBUMIN 4.4 3.3*   Recent Labs  Lab 09/29/22 1219  LIPASE 47   CBC: Recent Labs  Lab 09/29/22 1219 09/30/22 0033  WBC 8.8 7.1  HGB 16.6 14.4  HCT 54.8* 43.2  MCV 88.7 81.8  PLT 219 222   CBG: Recent Labs  Lab 09/29/22 1829 09/29/22 2054 09/30/22 0433 09/30/22 1308  GLUCAP 130* 188* 203* 176*   Studies/Results: DG Foot Complete Left  Result Date: 09/29/2022 CLINICAL DATA:  Infected left foot. Plantar surface distal first metatarsal EXAM: LEFT FOOT - COMPLETE 3 VIEW COMPARISON:  CT 05/06/2021 FINDINGS: There has been prior amputation of the phalanges of the great toe in the distal half of the first metatarsal. At the distal margin of this is a focal area of soft tissue thickening and lucency. Please correlate for location of ulcer. No definite  underlying erosive changes of the first metatarsal remnant. If there is further concern of bone infection, follow up bone scan or MRI may be useful for further sensitivity. There is chronic healed deformity of the distal shaft of the second metatarsal. There is also deformity of the head of third metatarsal which could be the sequela of previous AVN with collapse and lucency. Otherwise no fracture or dislocation. Preserved bone mineralization. IMPRESSION: Prior first ray amputation with adjacent soft tissue swelling and gas. No definite erosive changes at this time. More chronic healed fracture deformity of the shaft of second metatarsal. 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 These changes were not seen on the prior CT scan. Please correlate with any more recent x-ray to compare assess for subtle change Electronically Signed   By: Karen Kays M.D.   On: 09/29/2022 13:32    Dialysis Orders:  TTS at [facility] 4:30hr, 200dialyzer, BFR 450/A1.5, EDW 153kg, 2K/2.5Ca, AVG, 14g needles - Heparin 12,000 unit initial + 6000 unit mid-run bolus - No ESA - Hectoral IV q HD - Binder: Auryxia 4/meals, Sensipar 30mg  QD  Assessment/Plan:  Sepsis, presumed due to L foot wound: ?Osteo, MRI pending. Started on Vanc/Ceftriaxone/Flagyl.   ESRD:  Continue HD per usual TTS schedule -> next tomorrow.  Hypertension/volume: BP soft, uses midodrine pre-HD. No LE edema.  Metabolic bone disease: Ca/Phos ok - continue home sensipar, binders, VDRA.  Nutrition:  Adding supplement for wound healing. T2DM  Ozzie Hoyle, Cordelia Poche 09/30/2022, 10:31 AM  [employer]

## 2022-09-30 NOTE — Progress Notes (Signed)
Daily Progress Note Intern Pager: [phone]  Patient name: Paul [name] Medical record number: [id number] Date of birth: 1990/08/11 Age: 32 y.o. Gender: male  Primary Care Provider: Darral Dash, DO Consultants: Nephro, Ortho Code Status: FULL  Pt Overview and Major Events to Date:  5/9: Admitted to FMTS  Assessment and Plan: Paul [name] is a 32 y.o. male presenting with concern of sepsis with current likely source being infection from foot wound. 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   * Severe sepsis (HCC) Afebrile overnight. BP on softer side but stable and likely to improve with dialysis and midodrine today. Orthopedics to evaluate further for potential surgical intervention today. Pending MRI this AM. Pain well controlled with Tylenol. -Orthopedics following, appreciate recs -Vancomycin, CTX, Flagyl -MRI pending -NPO pending Ortho eval -Wound care q4 -F/u BC -Pain management: Tylenol prn  ESRD on dialysis [facility]) Nephrology to see today. Patient with short session yesterday, might need additional session but likely resume on Saturday. Patient receiving contrast with MRI, okay per Nephro. -Nephro consulted, appreciate recs -Avoid nephrotoxic agents  -Adding home binders -Midodrine with dialysis   Type 2 diabetes mellitus (HCC) A1c 7.4. Bgl in 100s up to 200. -vsSSI with CBG checks  Hypothyroidism TSH wnl. Continue home therapy of Levothyroxine   FEN/GI: NPO pending Ortho eval Prophylaxis: Eliquis Dispo: Home pending medical and possibly surgical management of foot wound  Subjective:  Patient assessed at bedside, laying in bed comfortably. Reports pain is well controlled with just Tylenol. Has been up to the bathroom. States he just had a short dialysis session yesterday. Does not make much urine.  Objective: Temp:  [98.5 F (36.9 C)-102.3 F  (39.1 C)] 98.6 F (37 C) (05/10 0726) Pulse Rate:  [96-133] 98 (05/10 0426) Resp:  [17-18] 18 (05/10 0426) BP: (97-107)/(48-67) 103/49 (05/10 0453) SpO2:  [94 %-100 %] 98 % (05/10 0426) Weight:  [154.4 kg-155 kg] 154.4 kg (05/09 1753) Physical Exam: General: Laying in bed, NAD Cardiovascular: RRR without murmur Respiratory: CTAB. Normal WOB on RA Abdomen: Soft, non-tender, non-distended Extremities: L foot with curlex wrapping. Area of serosanguinous drainage over plantar forefoot.  Laboratory: Most recent CBC Lab Results  Component Value Date   WBC 7.1 09/30/2022   HGB 14.4 09/30/2022   HCT 43.2 09/30/2022   MCV 81.8 09/30/2022   PLT 222 09/30/2022   Most recent BMP    Latest Ref Rng & Units 09/30/2022   12:33 AM  BMP  Glucose 70 - 99 mg/dL 329   BUN 6 - 20 mg/dL 40   Creatinine 5.18 - 1.24 mg/dL 84.16   Sodium 606 - 301 mmol/L 135   Potassium 3.5 - 5.1 mmol/L 3.6   Chloride 98 - 111 mmol/L 93   CO2 22 - 32 mmol/L 24   Calcium 8.9 - 10.3 mg/dL 9.0     Other pertinent labs: Glu: 176 Tsh: 3.9 A1c: 7.4 Lactate: 1.3  Imaging/Diagnostic Tests: DG Foot Complete Left Result Date: 09/29/2022 IMPRESSION: Prior first ray amputation with adjacent soft tissue swelling and gas. No definite erosive changes at this time. More chronic healed fracture deformity of the shaft of second metatarsal. 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 These changes were not seen on the prior CT scan. Please correlate with any more recent x-ray to compare assess for subtle change   Elberta Fortis,  MD 09/30/2022, 9:00 AM  PGY-1, [facility] FPTS Intern pager: [phone], text pages welcome Secure chat group [facility] Teaching Service

## 2022-10-01 ENCOUNTER — Encounter (HOSPITAL_COMMUNITY)
Admission: EM | Disposition: A | Discharge status: Home Health | Payer: Self-pay | Source: Home / Self Care | Attending: Family Medicine

## 2022-10-01 ENCOUNTER — Encounter (HOSPITAL_COMMUNITY): Payer: Self-pay | Admitting: Student

## 2022-10-01 ENCOUNTER — Inpatient Hospital Stay (HOSPITAL_COMMUNITY): Payer: Medicaid Other | Admitting: Certified Registered Nurse Anesthetist

## 2022-10-01 ENCOUNTER — Other Ambulatory Visit: Payer: Self-pay

## 2022-10-01 DIAGNOSIS — E1169 Type 2 diabetes mellitus with other specified complication: Secondary | ICD-10-CM

## 2022-10-01 DIAGNOSIS — Z992 Dependence on renal dialysis: Secondary | ICD-10-CM

## 2022-10-01 DIAGNOSIS — E1122 Type 2 diabetes mellitus with diabetic chronic kidney disease: Secondary | ICD-10-CM

## 2022-10-01 DIAGNOSIS — Z794 Long term (current) use of insulin: Secondary | ICD-10-CM | POA: Diagnosis not present

## 2022-10-01 DIAGNOSIS — I120 Hypertensive chronic kidney disease with stage 5 chronic kidney disease or end stage renal disease: Secondary | ICD-10-CM

## 2022-10-01 DIAGNOSIS — M869 Osteomyelitis, unspecified: Secondary | ICD-10-CM | POA: Diagnosis not present

## 2022-10-01 DIAGNOSIS — A419 Sepsis, unspecified organism: Secondary | ICD-10-CM | POA: Diagnosis not present

## 2022-10-01 DIAGNOSIS — L089 Local infection of the skin and subcutaneous tissue, unspecified: Secondary | ICD-10-CM | POA: Diagnosis not present

## 2022-10-01 DIAGNOSIS — M86172 Other acute osteomyelitis, left ankle and foot: Secondary | ICD-10-CM

## 2022-10-01 DIAGNOSIS — R6520 Severe sepsis without septic shock: Secondary | ICD-10-CM | POA: Diagnosis not present

## 2022-10-01 DIAGNOSIS — E11628 Type 2 diabetes mellitus with other skin complications: Secondary | ICD-10-CM | POA: Diagnosis not present

## 2022-10-01 DIAGNOSIS — E1151 Type 2 diabetes mellitus with diabetic peripheral angiopathy without gangrene: Secondary | ICD-10-CM | POA: Diagnosis not present

## 2022-10-01 DIAGNOSIS — N186 End stage renal disease: Secondary | ICD-10-CM

## 2022-10-01 HISTORY — PX: AMPUTATION: SHX166

## 2022-10-01 LAB — BASIC METABOLIC PANEL
Anion gap: 18 — ABNORMAL HIGH (ref 5–15)
BUN: 57 mg/dL — ABNORMAL HIGH (ref 6–20)
CO2: 22 mmol/L (ref 22–32)
Calcium: 8.1 mg/dL — ABNORMAL LOW (ref 8.9–10.3)
Chloride: 94 mmol/L — ABNORMAL LOW (ref 98–111)
Creatinine, Ser: 15.17 mg/dL — ABNORMAL HIGH (ref 0.61–1.24)
GFR, Estimated: 4 mL/min — ABNORMAL LOW (ref >60)
Glucose, Bld: 182 mg/dL — ABNORMAL HIGH (ref 70–99)
Potassium: 3.6 mmol/L (ref 3.5–5.1)
Sodium: 134 mmol/L — ABNORMAL LOW (ref 135–145)

## 2022-10-01 LAB — CBC
HCT: 37.9 % — ABNORMAL LOW (ref 39.0–52.0)
HCT: 40.5 % (ref 39.0–52.0)
Hemoglobin: 12.3 g/dL — ABNORMAL LOW (ref 13.0–17.0)
Hemoglobin: 13.3 g/dL (ref 13.0–17.0)
MCH: 26.4 pg (ref 26.0–34.0)
MCH: 26.8 pg (ref 26.0–34.0)
MCHC: 32.5 g/dL (ref 30.0–36.0)
MCHC: 32.8 g/dL (ref 30.0–36.0)
MCV: 81.3 fL (ref 80.0–100.0)
MCV: 81.5 fL (ref 80.0–100.0)
Platelets: 241 10*3/uL (ref 150–400)
Platelets: 242 10*3/uL (ref 150–400)
RBC: 4.66 MIL/uL (ref 4.22–5.81)
RBC: 4.97 MIL/uL (ref 4.22–5.81)
RDW: 17.4 % — ABNORMAL HIGH (ref 11.5–15.5)
RDW: 17.8 % — ABNORMAL HIGH (ref 11.5–15.5)
WBC: 8.1 10*3/uL (ref 4.0–10.5)
WBC: 8.5 10*3/uL (ref 4.0–10.5)
nRBC: 0 % (ref 0.0–0.2)
nRBC: 0 % (ref 0.0–0.2)

## 2022-10-01 LAB — HEPATITIS B SURFACE ANTIBODY, QUANTITATIVE: Hep B S AB Quant (Post): 34.8 m[IU]/mL (ref >9.9)

## 2022-10-01 LAB — GLUCOSE, CAPILLARY
Glucose-Capillary: 179 mg/dL — ABNORMAL HIGH (ref 70–99)
Glucose-Capillary: 183 mg/dL — ABNORMAL HIGH (ref 70–99)
Glucose-Capillary: 217 mg/dL — ABNORMAL HIGH (ref 70–99)

## 2022-10-01 LAB — CULTURE, BLOOD (ROUTINE X 2): Culture: NO GROWTH

## 2022-10-01 SURGERY — AMPUTATION, FOOT, RAY
Anesthesia: Monitor Anesthesia Care | Site: Foot | Laterality: Left

## 2022-10-01 MED ORDER — PROMETHAZINE HCL 25 MG/ML IJ SOLN: 6.2500 mg | INTRAMUSCULAR | Status: DC | PRN

## 2022-10-01 MED ORDER — ONDANSETRON HCL 4 MG/2ML IJ SOLN
INTRAMUSCULAR | Status: DC | PRN
  Administered 2022-10-01: 4 mg via INTRAVENOUS

## 2022-10-01 MED ORDER — PROPOFOL 10 MG/ML IV BOLUS
INTRAVENOUS | Status: AC
  Filled 2022-10-01: qty 20

## 2022-10-01 MED ORDER — PENTAFLUOROPROP-TETRAFLUOROETH EX AERO: 1.0000 | INHALATION_SPRAY | CUTANEOUS | Status: DC | PRN

## 2022-10-01 MED ORDER — MELATONIN 3 MG PO TABS
3.0000 mg | ORAL_TABLET | Freq: Every evening | ORAL | Status: DC | PRN
  Administered 2022-10-01: 3 mg via ORAL
  Filled 2022-10-01: qty 1

## 2022-10-01 MED ORDER — CEFAZOLIN IN SODIUM CHLORIDE 3-0.9 GM/100ML-% IV SOLN
INTRAVENOUS | Status: AC
  Filled 2022-10-01: qty 100

## 2022-10-01 MED ORDER — PHENYLEPHRINE 80 MCG/ML (10ML) SYRINGE FOR IV PUSH (FOR BLOOD PRESSURE SUPPORT)
PREFILLED_SYRINGE | INTRAVENOUS | Status: DC | PRN
  Administered 2022-10-01: 160 ug via INTRAVENOUS

## 2022-10-01 MED ORDER — FENTANYL CITRATE (PF) 250 MCG/5ML IJ SOLN
INTRAMUSCULAR | Status: DC | PRN
  Administered 2022-10-01: 50 ug via INTRAVENOUS

## 2022-10-01 MED ORDER — DOCUSATE SODIUM 100 MG PO CAPS
100.0000 mg | ORAL_CAPSULE | Freq: Every day | ORAL | Status: DC
  Administered 2022-10-02: 100 mg via ORAL
  Filled 2022-10-01: qty 1

## 2022-10-01 MED ORDER — HEPARIN SODIUM (PORCINE) 1000 UNIT/ML DIALYSIS
10000.0000 [IU] | Freq: Once | INTRAMUSCULAR | Status: AC
  Administered 2022-10-01: 10000 [IU] via INTRAVENOUS_CENTRAL
  Filled 2022-10-01: qty 10

## 2022-10-01 MED ORDER — ALUM & MAG HYDROXIDE-SIMETH 200-200-20 MG/5ML PO SUSP: 15.0000 mL | ORAL | Status: DC | PRN

## 2022-10-01 MED ORDER — LABETALOL HCL 5 MG/ML IV SOLN: 10.0000 mg | INTRAVENOUS | Status: DC | PRN

## 2022-10-01 MED ORDER — ALTEPLASE 2 MG IJ SOLR: 2.0000 mg | Freq: Once | INTRAMUSCULAR | Status: DC | PRN

## 2022-10-01 MED ORDER — OXYCODONE HCL 5 MG/5ML PO SOLN: 5.0000 mg | Freq: Once | ORAL | Status: DC | PRN

## 2022-10-01 MED ORDER — OXYCODONE HCL 5 MG PO TABS
10.0000 mg | ORAL_TABLET | ORAL | Status: DC | PRN
  Administered 2022-10-01: 15 mg via ORAL
  Filled 2022-10-01: qty 3

## 2022-10-01 MED ORDER — PHENOL 1.4 % MT LIQD: 1.0000 | OROMUCOSAL | Status: DC | PRN

## 2022-10-01 MED ORDER — SODIUM CHLORIDE 0.9 % IV SOLN: INTRAVENOUS | Status: DC

## 2022-10-01 MED ORDER — CHLORHEXIDINE GLUCONATE 0.12 % MT SOLN: 15.0000 mL | Freq: Once | OROMUCOSAL | Status: DC

## 2022-10-01 MED ORDER — ANTICOAGULANT SODIUM CITRATE 4% (200MG/5ML) IV SOLN: 5.0000 mL | Status: DC | PRN

## 2022-10-01 MED ORDER — GUAIFENESIN-DM 100-10 MG/5ML PO SYRP: 15.0000 mL | ORAL_SOLUTION | ORAL | Status: DC | PRN

## 2022-10-01 MED ORDER — ZINC SULFATE 220 (50 ZN) MG PO CAPS
220.0000 mg | ORAL_CAPSULE | Freq: Every day | ORAL | Status: DC
  Administered 2022-10-01 – 2022-10-02 (×2): 220 mg via ORAL
  Filled 2022-10-01 (×2): qty 1

## 2022-10-01 MED ORDER — MAGNESIUM CITRATE PO SOLN: 1.0000 | Freq: Once | ORAL | Status: DC | PRN

## 2022-10-01 MED ORDER — MIDAZOLAM HCL 2 MG/2ML IJ SOLN
INTRAMUSCULAR | Status: AC
  Filled 2022-10-01: qty 2

## 2022-10-01 MED ORDER — METOPROLOL TARTRATE 5 MG/5ML IV SOLN: 2.0000 mg | INTRAVENOUS | Status: DC | PRN

## 2022-10-01 MED ORDER — ROPIVACAINE HCL 5 MG/ML IJ SOLN
INTRAMUSCULAR | Status: DC | PRN
  Administered 2022-10-01: 30 mL via PERINEURAL

## 2022-10-01 MED ORDER — ONDANSETRON HCL 4 MG/2ML IJ SOLN: 4.0000 mg | Freq: Four times a day (QID) | INTRAMUSCULAR | Status: DC | PRN

## 2022-10-01 MED ORDER — CEFAZOLIN SODIUM-DEXTROSE 2-4 GM/100ML-% IV SOLN: 2.0000 g | Freq: Three times a day (TID) | INTRAVENOUS | Status: DC

## 2022-10-01 MED ORDER — DEXTROSE 5 % IV SOLN
INTRAVENOUS | Status: DC | PRN
  Administered 2022-10-01: 3 g via INTRAVENOUS

## 2022-10-01 MED ORDER — JUVEN PO PACK
1.0000 | PACK | Freq: Two times a day (BID) | ORAL | Status: DC
  Administered 2022-10-01 – 2022-10-02 (×2): 1 via ORAL
  Filled 2022-10-01 (×2): qty 1

## 2022-10-01 MED ORDER — MAGNESIUM SULFATE 2 GM/50ML IV SOLN: 2.0000 g | Freq: Every day | INTRAVENOUS | Status: DC | PRN

## 2022-10-01 MED ORDER — POTASSIUM CHLORIDE CRYS ER 20 MEQ PO TBCR: 20.0000 meq | EXTENDED_RELEASE_TABLET | Freq: Every day | ORAL | Status: DC | PRN

## 2022-10-01 MED ORDER — VANCOMYCIN HCL 1000 MG IV SOLR
INTRAVENOUS | Status: AC
  Filled 2022-10-01: qty 20

## 2022-10-01 MED ORDER — CHLORHEXIDINE GLUCONATE 0.12 % MT SOLN
OROMUCOSAL | Status: AC
  Filled 2022-10-01: qty 15

## 2022-10-01 MED ORDER — FENTANYL CITRATE (PF) 250 MCG/5ML IJ SOLN
INTRAMUSCULAR | Status: AC
  Filled 2022-10-01: qty 5

## 2022-10-01 MED ORDER — MIDAZOLAM HCL 2 MG/2ML IJ SOLN
INTRAMUSCULAR | Status: DC | PRN
  Administered 2022-10-01: 2 mg via INTRAVENOUS

## 2022-10-01 MED ORDER — PROPOFOL 500 MG/50ML IV EMUL
INTRAVENOUS | Status: DC | PRN
  Administered 2022-10-01: 150 ug/kg/min via INTRAVENOUS

## 2022-10-01 MED ORDER — HEPARIN SODIUM (PORCINE) 1000 UNIT/ML DIALYSIS: 1000.0000 [IU] | INTRAMUSCULAR | Status: DC | PRN

## 2022-10-01 MED ORDER — DOXYCYCLINE HYCLATE 100 MG PO TABS: 100.0000 mg | ORAL_TABLET | Freq: Two times a day (BID) | ORAL | 0 refills | Status: DC

## 2022-10-01 MED ORDER — VITAMIN C 500 MG PO TABS
1000.0000 mg | ORAL_TABLET | Freq: Every day | ORAL | Status: DC
  Administered 2022-10-01 – 2022-10-02 (×2): 1000 mg via ORAL
  Filled 2022-10-01 (×2): qty 2

## 2022-10-01 MED ORDER — BISACODYL 5 MG PO TBEC: 5.0000 mg | DELAYED_RELEASE_TABLET | Freq: Every day | ORAL | Status: DC | PRN

## 2022-10-01 MED ORDER — LIDOCAINE-PRILOCAINE 2.5-2.5 % EX CREA: 1.0000 | TOPICAL_CREAM | CUTANEOUS | Status: DC | PRN

## 2022-10-01 MED ORDER — PANTOPRAZOLE SODIUM 40 MG PO TBEC
40.0000 mg | DELAYED_RELEASE_TABLET | Freq: Every day | ORAL | Status: DC
  Administered 2022-10-01 – 2022-10-02 (×2): 40 mg via ORAL
  Filled 2022-10-01 (×2): qty 1

## 2022-10-01 MED ORDER — ACETAMINOPHEN 10 MG/ML IV SOLN: 1000.0000 mg | Freq: Once | INTRAVENOUS | Status: DC | PRN

## 2022-10-01 MED ORDER — VANCOMYCIN HCL 1000 MG IV SOLR
INTRAVENOUS | Status: DC | PRN
  Administered 2022-10-01: 1000 mg via TOPICAL

## 2022-10-01 MED ORDER — INSULIN ASPART 100 UNIT/ML IJ SOLN: 0.0000 [IU] | INTRAMUSCULAR | Status: DC | PRN

## 2022-10-01 MED ORDER — 0.9 % SODIUM CHLORIDE (POUR BTL) OPTIME
TOPICAL | Status: DC | PRN
  Administered 2022-10-01: 1000 mL

## 2022-10-01 MED ORDER — POLYETHYLENE GLYCOL 3350 17 G PO PACK: 17.0000 g | PACK | Freq: Every day | ORAL | Status: DC | PRN

## 2022-10-01 MED ORDER — HYDROMORPHONE HCL 1 MG/ML IJ SOLN: 0.5000 mg | INTRAMUSCULAR | Status: DC | PRN

## 2022-10-01 MED ORDER — ORAL CARE MOUTH RINSE: 15.0000 mL | Freq: Once | OROMUCOSAL | Status: DC

## 2022-10-01 MED ORDER — LIDOCAINE HCL (PF) 1 % IJ SOLN: 5.0000 mL | INTRAMUSCULAR | Status: DC | PRN

## 2022-10-01 MED ORDER — OXYCODONE HCL 5 MG PO TABS
5.0000 mg | ORAL_TABLET | ORAL | Status: DC | PRN
  Administered 2022-10-01 – 2022-10-02 (×2): 10 mg via ORAL
  Filled 2022-10-01 (×2): qty 2

## 2022-10-01 MED ORDER — APIXABAN 5 MG PO TABS
5.0000 mg | ORAL_TABLET | Freq: Two times a day (BID) | ORAL | Status: DC
  Administered 2022-10-02: 5 mg via ORAL
  Filled 2022-10-01: qty 1

## 2022-10-01 MED ORDER — OXYCODONE HCL 5 MG PO TABS: 5.0000 mg | ORAL_TABLET | Freq: Once | ORAL | Status: DC | PRN

## 2022-10-01 MED ORDER — ACETAMINOPHEN 325 MG PO TABS: 325.0000 mg | ORAL_TABLET | Freq: Four times a day (QID) | ORAL | Status: DC | PRN

## 2022-10-01 MED ORDER — FENTANYL CITRATE (PF) 100 MCG/2ML IJ SOLN: 25.0000 ug | INTRAMUSCULAR | Status: DC | PRN

## 2022-10-01 SURGICAL SUPPLY — 27 items
BAG COUNTER SPONGE SURGICOUNT (BAG) ×1 IMPLANT
BLADE SURG 21 STRL SS (BLADE) ×1 IMPLANT
BNDG COHESIVE 4X5 TAN STRL (GAUZE/BANDAGES/DRESSINGS) ×1 IMPLANT
CANISTER WOUND CARE 500ML ATS (WOUND CARE) IMPLANT
CNTNR URN SCR LID CUP LEK RST (MISCELLANEOUS) IMPLANT
CONT SPEC 4OZ STRL OR WHT (MISCELLANEOUS) ×1
COVER SURGICAL LIGHT HANDLE (MISCELLANEOUS) ×2 IMPLANT
DRAPE INCISE IOBAN 66X45 STRL (DRAPES) IMPLANT
DRAPE U-SHAPE 47X51 STRL (DRAPES) ×2 IMPLANT
DRESSING PEEL AND PLC PRVNA 13 (GAUZE/BANDAGES/DRESSINGS) IMPLANT
DRSG PEEL AND PLACE PREVENA 13 (GAUZE/BANDAGES/DRESSINGS) ×1
DURAPREP 26ML APPLICATOR (WOUND CARE) ×1 IMPLANT
ELECT REM PT RETURN 9FT ADLT (ELECTROSURGICAL) ×1
ELECTRODE REM PT RTRN 9FT ADLT (ELECTROSURGICAL) ×1 IMPLANT
GLOVE BIOGEL PI IND STRL 9 (GLOVE) ×1 IMPLANT
GLOVE SURG ORTHO 9.0 STRL STRW (GLOVE) ×1 IMPLANT
GOWN STRL REUS W/ TWL XL LVL3 (GOWN DISPOSABLE) ×2 IMPLANT
GOWN STRL REUS W/TWL XL LVL3 (GOWN DISPOSABLE) ×2
GRAFT SKIN WND MICRO 38 (Tissue) IMPLANT
KIT BASIN OR (CUSTOM PROCEDURE TRAY) ×1 IMPLANT
KIT TURNOVER KIT B (KITS) ×1 IMPLANT
NS IRRIG 1000ML POUR BTL (IV SOLUTION) ×1 IMPLANT
PACK ORTHO EXTREMITY (CUSTOM PROCEDURE TRAY) ×1 IMPLANT
SUT ETHILON 2 0 PSLX (SUTURE) ×1 IMPLANT
TOWEL GREEN STERILE (TOWEL DISPOSABLE) ×1 IMPLANT
TUBE CONNECTING 12X1/4 (SUCTIONS) ×1 IMPLANT
YANKAUER SUCT BULB TIP NO VENT (SUCTIONS) ×1 IMPLANT

## 2022-10-01 NOTE — Progress Notes (Signed)
[facility] Progress Note   Subjective:   Seen in room - s/p L 1st ray amputation this AM - wound vac in place. No CP but with mild dyspnea. For HD today - he thinks needs 5L removed, will adjust orders.  Objective Vitals:   10/01/22 0744 10/01/22 0900 10/01/22 0915 10/01/22 0947  BP: (!) 144/73 (!) 122/35 101/69 (!) 114/44  Pulse: 85 99 92   Resp: 18 (!) 23 14 15   Temp: 98.2 F (36.8 C) 97.6 F (36.4 C) 97.8 F (36.6 C) 98.4 F (36.9 C)  TempSrc: Oral  (P) Oral Oral  SpO2: 100% 99% 96%   Weight:       Physical Exam General: Well appearing man, NAD. Room air Heart: RRR; no murmur Lungs: CTA anteriorly Abdomen: soft Extremities: no LE edema; L foot bandaged with wound vac in place Dialysis Access: LUE AVG + bruit  Additional Objective Labs: Basic Metabolic Panel: Recent Labs  Lab 09/29/22 1219 09/30/22 0033 10/01/22 0026  NA 133* 135 134*  K 3.8 3.6 3.6  CL 93* 93* 94*  CO2 20* 24 22  GLUCOSE 159* 187* 182*  BUN 28* 40* 57*  CREATININE 11.31* 12.87* 15.17*  CALCIUM 9.6 9.0 8.1*  PHOS  --  5.1*  --    Liver Function Tests: Recent Labs  Lab 09/29/22 1219 09/30/22 0033  AST 34  --   ALT 23  --   ALKPHOS 72  --   BILITOT 0.8  --   PROT 9.2*  --   ALBUMIN 4.4 3.3*   Recent Labs  Lab 09/29/22 1219  LIPASE 47   CBC: Recent Labs  Lab 09/29/22 1219 09/30/22 0033 10/01/22 0026  WBC 8.8 7.1 8.1  HGB 16.6 14.4 13.3  HCT 54.8* 43.2 40.5  MCV 88.7 81.8 81.5  PLT 219 222 242   Blood Culture    Component Value Date/Time   SDES TISSUE 10/01/2022 0833   SPECREQUEST  10/01/2022 1610    LEFT FOOT Performed at [facility], [address], [place], [place] [address]    CULT PENDING 10/01/2022 4098   REPTSTATUS PENDING 10/01/2022 1191   Studies/Results: MR FOOT LEFT W WO CONTRAST  Result Date: 09/30/2022 CLINICAL DATA:  Left foot infection. EXAM: MRI OF THE LEFT FOREFOOT WITHOUT AND WITH CONTRAST TECHNIQUE: Multiplanar,  multisequence MR imaging of the left foot was performed both before and after administration of intravenous contrast. CONTRAST:  10mL GADAVIST GADOBUTROL 1 MMOL/ML IV SOLN COMPARISON:  Left foot x-rays dated Sep 29, 2022. MRI left foot dated May 07, 2021. FINDINGS: Bones/Joint/Cartilage Prior first ray amputation abnormal marrow edema and enhancement involving the residual first metatarsal. 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 Mild marrow edema and cystic change in the third metatarsal head with subchondral collapse, consistent with avascular necrosis. 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  No joint effusion. Ligaments Second through fifth toe collateral ligaments are intact. Lisfranc ligament is intact. Muscles and Tendons Postsurgical changes of the first flexor and extensor tendons. Remaining flexor and extensor tendons are intact. No tenosynovitis. Increased T2 signal within the intrinsic muscles of the forefoot, nonspecific, but likely related to diabetic muscle changes. Soft tissue Medial plantar ulceration along the forefoot with sinus tract extending to the residual first metatarsal. 1.4 x 1.8 x 1.7 cm bilobed rim enhancing fluid collection at the plantar aspect of the residual first metatarsal tip, communicating with the sinus tract. No  soft tissue mass. IMPRESSION: 1. Medial plantar ulceration along the forefoot with sinus tract extending to the residual first metatarsal. Osteomyelitis of the residual first metatarsal with 1.8 cm abscess along the plantar aspect, communicating with the sinus tract. 2. 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 3. 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 Electronically Signed   By: Obie Dredge M.D.   On: 09/30/2022 11:22   DG Foot Complete Left  Result Date: 09/29/2022 CLINICAL DATA:  Infected left foot. Plantar surface distal  first metatarsal EXAM: LEFT FOOT - COMPLETE 3 VIEW COMPARISON:  CT 05/06/2021 FINDINGS: There has been prior amputation of the phalanges of the great toe in the distal half of the first metatarsal. At the distal margin of this is a focal area of soft tissue thickening and lucency. Please correlate for location of ulcer. No definite underlying erosive changes of the first metatarsal remnant. If there is further concern of bone infection, follow up bone scan or MRI may be useful for further sensitivity. There is chronic healed deformity of the distal shaft of the second metatarsal. There is also deformity of the head of third metatarsal which could be the sequela of previous AVN with collapse and lucency. Otherwise no fracture or dislocation. Preserved bone mineralization. IMPRESSION: Prior first ray amputation with adjacent soft tissue swelling and gas. No definite erosive changes at this time. More chronic healed fracture deformity of the shaft of second metatarsal. 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 These changes were not seen on the prior CT scan. Please correlate with any more recent x-ray to compare assess for subtle change Electronically Signed   By: Karen Kays M.D.   On: 09/29/2022 13:32    Medications:  sodium chloride 75 mL/hr at 10/01/22 1001   ceFAZolin      ceFAZolin (ANCEF) IV     cefTRIAXone (ROCEPHIN)  IV Stopped (09/30/22 1448)   magnesium sulfate bolus IVPB     vancomycin      (feeding supplement) PROSource Plus  30 mL Oral BID BM   vitamin C  1,000 mg Oral Daily   chlorhexidine       cinacalcet  30 mg Oral Daily   [START ON 10/02/2022] docusate sodium  100 mg Oral Daily   doxercalciferol  3 mcg Intravenous Q T,Th,Sa-HD   ferric citrate  840 mg Oral TID with meals   gabapentin  100 mg Oral TID   gabapentin  300 mg Oral QHS   insulin aspart  0-6 Units Subcutaneous TID WC   levothyroxine  150 mcg Oral QAC breakfast   midodrine   5 mg Oral Q T,Th,Sa-HD   multivitamin  1 tablet Oral QHS   nutrition supplement (JUVEN)  1 packet Oral BID BM   pantoprazole  40 mg Oral Daily   rosuvastatin  20 mg Oral Daily   zinc sulfate  220 mg Oral Daily    Dialysis Orders: TTS at [facility] 4:30hr, 200dialyzer, BFR 450/A1.5, EDW 153kg, 2K/2.5Ca, AVG, 14g needles - Heparin 12,000 unit initial + 6000 unit mid-run bolus - No ESA - Hectoral IV q HD - Binder: Auryxia 4/meals, Sensipar 30mg  QD   Assessment/Plan:  Sepsis, presumed due to L foot wound: MRI showing OM with abscess - s/p 1st ray amputation this AM - wound Cx pending. Remains Vanc/Ceftriaxone/Flagyl.   ESRD:  Continue HD per usual TTS schedule -> HD today.  Hypertension/volume: BP soft, uses midodrine pre-HD. No  LE edema, but with SOB - he is requesting large UFG - will try to get.  Metabolic bone disease: Ca/Phos ok - continue home sensipar, binders, VDRA.  Nutrition:  Continue supplement for wound healing. T2DM  Ozzie Hoyle, Cordelia Poche 10/01/2022, 10:13 AM  [employer]

## 2022-10-01 NOTE — Progress Notes (Signed)
FMTS Brief Progress Note  S:Went to see patient after operation. Patient laying in bed comfortable, reports pain well controlled. Asked if he had to keep foot elevated.    O: BP 101/71 (BP Location: Right Leg)   Pulse (!) 29   Temp 98.8 F (37.1 C) (Oral)   Resp 14   Wt (!) 165.7 kg   SpO2 97%   BMI 46.90 kg/m   General: NAD, conversational, good mood Ext: LLE foot wrapped w/ drain, elevated on pillow  A/P: Acute Osteomyelitis Patient had 1st ray amputation, doing well post surgery. Told patient to keep foot elevated for first 48 hours at least.  -plan to deescalate abx in am -plans per day team - Orders reviewed. Labs for AM ordered, which was adjusted as needed.   ESRD on dialysis HD today, doing well post session. Requesting ice pac for arm pain.  -spoke w/ nurse about getting ice-pack for patient. -plans per day team   Bess Kinds, MD 10/01/2022, 10:47 PM PGY-2, [facility] Night Resident  Please page [phone] with questions.

## 2022-10-01 NOTE — TOC Initial Note (Signed)
ansition of Care [facility]) - Initial/Assessment Note    Patient Details  Name: Paul [name] MRN: [id number] Date of Birth: [birth year]  Transition of Care [facility]) CM/SW Contact:    Gordy Clement, RN Phone Number: 10/01/2022, 4:30 PM  Clinical Narrative:        CM acknowledges "Consult to [facility] for  Home Health and DME Needs" Bariatric RW and Bariatric tub bench have been ordered from [employer]. Home Health PT will need to be ordered and arranged . Patient does have Medicaid    Expected Discharge Plan: Home/Self Care Barriers to Discharge: Continued Medical Work up   Patient Goals and CMS Choice            Expected Discharge Plan and Services   Discharge Planning Services: CM Consult   Living arrangements for the past 2 months: Apartment                                      Prior Living Arrangements/Services Living arrangements for the past 2 months: Apartment Lives with:: Parents Patient language and need for interpreter reviewed:: Yes Do you feel safe going back to the place where you live?: Yes            Criminal Activity/Legal Involvement Pertinent to Current Situation/Hospitalization: No - Comment as needed  Activities of Daily Living Home Assistive Devices/Equipment: None ADL Screening (condition at time of admission) Patient's cognitive ability adequate to safely complete daily activities?: Yes Is the patient deaf or have difficulty hearing?: No Does the patient have difficulty seeing, even when wearing glasses/contacts?: No Does the patient have difficulty concentrating, remembering, or making decisions?: No Patient able to express need for assistance with ADLs?: Yes Does the patient have difficulty dressing or bathing?: No Independently performs ADLs?: Yes (appropriate for developmental age) Does the patient have difficulty walking or climbing stairs?: No Weakness of Legs: None Weakness of Arms/Hands: None  Permission Sought/Granted                   Emotional Assessment Appearance:: Appears stated age Attitude/Demeanor/Rapport: Engaged Affect (typically observed): Accepting Orientation: : Oriented to Self, Oriented to Place, Oriented to  Time, Oriented to Situation   Psych Involvement: No (comment)  Admission diagnosis:  Diabetic foot infection (HCC) [W09.811, L08.9] Severe sepsis (HCC) [A41.9, R65.20] Sepsis, due to unspecified organism, unspecified whether acute organ dysfunction present [facility]) [A41.9] Patient Active Problem List   Diagnosis Date Noted   Acute osteomyelitis of left foot (HCC) 09/30/2022   Sepsis (HCC) 09/29/2022   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   Thrombus 05/04/2022   Ischemia of left upper extremity 05/04/2022   Complication of vascular access for dialysis 04/19/2022   ESRD (end stage renal disease) (HCC) 04/19/2022   Failure of hemodialysis access (HCC) 04/18/2022   Left hand pain 09/22/2021   Diabetic foot infection (HCC)    Wound infection 05/07/2021   MSSA bacteremia 05/07/2021   Osteomyelitis (HCC) 05/07/2021   Abscess 05/07/2021   Sepsis due to cellulitis (HCC) 05/06/2021   Poor compliance with medication 03/17/2021   Fluid overload, unspecified 07/09/2020   Complication of vascular dialysis catheter 03/23/2020   Other disorders of phosphorus metabolism 01/28/2020   Hypertension 01/08/2020   Hypocalcemia 10/29/2019   ESRD on dialysis (HCC) 11/16/2017   Pruritus, unspecified 04/04/2017   Unspecified protein-calorie malnutrition (HCC) 11/11/2016   Headache, unspecified 10/29/2016   Encounter for immunization 10/19/2016  Iron deficiency anemia, unspecified 10/10/2016   Anemia in chronic kidney disease 09/30/2016   Coagulation defect, unspecified (HCC) 09/30/2016   Pneumonia due to Pseudomonas (HCC) 09/30/2016   Secondary hyperparathyroidism of renal origin (HCC) 09/30/2016   Hypothyroidism 10/29/2009   Type 2 diabetes mellitus (HCC) 04/14/2009   Hyperlipidemia  04/14/2009   OBESITY, MORBID 04/14/2009   Essential hypertension, benign 04/14/2009   PCP:  Darral Dash, DO Pharmacy:   [facility] [id number] [place] - [address] [place] [address] Phone: [phone] Fax: [phone]  [facility] [place], [place] - [address] [employer], [address] [place] [address] Phone: [phone] Fax: [phone]  [facility] [address] [place] [address] Phone: [phone] Fax: [phone]     Social Determinants of Health (SDOH) Social History: SDOH Screenings   Food Insecurity: No Food Insecurity (09/29/2022)  Housing: Low Risk  (09/29/2022)  Transportation Needs: No Transportation Needs (09/29/2022)  Utilities: Not At Risk (09/29/2022)  Alcohol Screen: Low Risk  (06/07/2021)  Depression (PHQ2-9): Medium Risk (05/11/2022)  Physical Activity: Insufficiently Active (07/07/2021)  Social Connections: Socially Isolated (06/07/2021)  Stress: Stress Concern Present (08/06/2021)  Tobacco Use: Low Risk  (10/01/2022)   SDOH Interventions:     Readmission Risk Interventions     No data to display

## 2022-10-01 NOTE — Progress Notes (Addendum)
     Daily Progress Note Intern Pager: [phone]  Patient name: Paul [name] Medical record number: [id number] Date of birth: [birth year] Age: 32 y.o. Gender: male  Primary Care Provider: Darral Dash, DO Consultants: Nephro, Ortho Code Status: FULL   Pt Overview and Major Events to Date:  5/9: Admitted to FMTS   Assessment and Plan: Paul [name] is a 32 y.o. male presenting with concern of sepsis with current likely source being infection from foot wound. 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   Acute osteomyelitis of left foot (HCC) Received 1st ray amputation this AM for L foot osteomyelitis. Will de-escalate abx post-op pending wound cultures. Afebrile and BP has remained stable. Pain well controlled. BC no growth @ 24 hours. Weight bearing as tolerated. -Orthopedics following, appreciate recs -Consolidate to Vancomycin, d/c CTX and Flagyl -F/u wound cultures and BC -Pain management: Tylenol prn -Restart Eliquis in AM  ESRD on dialysis [facility]) Plan for HD today after surgical intervention this AM. -Nephro consulted, appreciate recs -Avoid nephrotoxic agents  -Adding home binders -Midodrine with dialysis   Type 2 diabetes mellitus (HCC) BGL fairly controlled, consistently in 100s. -vsSSI with CBG checks   Chronic conditions: Hypothyroidism: continue Levothyroxine  FEN/GI: NPO pending Ortho eval Prophylaxis: Eliquis Dispo: Home pending medical and possibly surgical management of foot wound  Subjective:  Patient assessed at bedside, received 1st ray amputation this AM. Doing well. No concerns.  Objective: Temp:  [97.6 F (36.4 C)-98.4 F (36.9 C)] 98.4 F (36.9 C) (05/11 0947) Pulse Rate:  [81-99] 92 (05/11 0915) Resp:  [14-23] 15 (05/11 0947) BP: (101-144)/(35-93) 114/44 (05/11 0947) SpO2:  [95 %-100 %] 96 % (05/11 0915) Physical Exam: General:  Laying in bed, NAD Cardiovascular: RRR without murmur Respiratory: CTAB. Normal WOB on RA Abdomen: Soft, non-tender, non-distended Extremities: L foot bandaged, clean and dry. No surrounding erythema or edema.  Laboratory: Most recent CBC Lab Results  Component Value Date   WBC 8.1 10/01/2022   HGB 13.3 10/01/2022   HCT 40.5 10/01/2022   MCV 81.5 10/01/2022   PLT 242 10/01/2022   Most recent BMP    Latest Ref Rng & Units 10/01/2022   12:26 AM  BMP  Glucose 70 - 99 mg/dL 147   BUN 6 - 20 mg/dL 57   Creatinine 8.29 - 1.24 mg/dL 56.21   Sodium 308 - 657 mmol/L 134   Potassium 3.5 - 5.1 mmol/L 3.6   Chloride 98 - 111 mmol/L 94   CO2 22 - 32 mmol/L 22   Calcium 8.9 - 10.3 mg/dL 8.1     Other pertinent labs: Glu: 179 Hep B: 34.8  Imaging/Diagnostic Tests: MR FOOT LEFT W WO CONTRAST Result Date: 09/30/2022 IMPRESSION: 1. Medial plantar ulceration along the forefoot with sinus tract extending to the residual first metatarsal. Osteomyelitis of the residual first metatarsal with 1.8 cm abscess along the plantar aspect, communicating with the sinus tract. 2. 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 3. 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   Elberta Fortis, MD 10/01/2022, 11:14 AM  PGY-1, [facility] FPTS Intern pager: [phone], text pages welcome Secure chat group [facility] Teaching Service

## 2022-10-01 NOTE — Progress Notes (Signed)
Received patient in bed to unit.  Alert and oriented.  Informed consent signed and in chart.   TX duration:  Patient tolerated well.  Transported back to the room  Alert, without acute distress.  Hand-off given to patient's nurse.   Access used: 4 hours Access issues: none  Total UF removed: 5300 mls Medication(s) given: vanc  Post HD VS: 92/74 Post HD weight: unable to obtain     10/01/22 2030  Vitals  Temp 98.3 F (36.8 C)  Temp Source Oral  BP 92/74  MAP (mmHg) 80  BP Location Right Leg  BP Method Automatic  Patient Position (if appropriate) Lying  Pulse Rate (!) 29  Pulse Rate Source Monitor  ECG Heart Rate 92  Resp 14  Oxygen Therapy  SpO2 97 %  O2 Device Room Air  Patient Activity (if Appropriate) In bed  Pulse Oximetry Type Continuous  Post Treatment  Dialyzer Clearance Lightly streaked  Duration of HD Treatment -hour(s) 4 hour(s)  Hemodialysis Intake (mL) 0 mL  Liters Processed 94.2  Fluid Removed (mL) 5300 mL  Tolerated HD Treatment Yes  Post-Hemodialysis Comments HD tx achieved as expected, tolerated well. no complaints.  pt is stable.  AVG/AVF Arterial Site Held (minutes) 10 minutes  AVG/AVF Venous Site Held (minutes) 10 minutes  Note  Observations pt is in bed resting  Fistula / Graft Left Upper arm Arteriovenous vein graft  Placement Date/Time: 08/20/21 1350   Placed prior to admission: No  Orientation: Left  Access Location: (c) Upper arm  Access Type: Arteriovenous vein graft  Expiration Date: 06/28/25  Site Condition No complications  Fistula / Graft Assessment Present;Thrill;Bruit  Status Deaccessed  Drainage Description None

## 2022-10-01 NOTE — Anesthesia Preprocedure Evaluation (Signed)
Anesthesia Evaluation  Patient identified by MRN, date of birth, ID band Patient awake    Reviewed: Allergy & Precautions, NPO status , Patient's Chart, lab work & pertinent test results  Airway Mallampati: II  TM Distance: >3 FB Neck ROM: Full    Dental no notable dental hx.    Pulmonary neg pulmonary ROS   Pulmonary exam normal        Cardiovascular hypertension, + Peripheral Vascular Disease  Normal cardiovascular exam     Neuro/Psych  Headaches  negative psych ROS   GI/Hepatic negative GI ROS, Neg liver ROS,,,  Endo/Other  diabetes, Insulin DependentHypothyroidism  Morbid obesity  Renal/GU ESRF and DialysisRenal diseaseOn HD T, R, Sat     Musculoskeletal negative musculoskeletal ROS (+)    Abdominal  (+) + obese  Peds  Hematology  (+) Blood dyscrasia (Eliquis)   Anesthesia Other Findings Osteomyelitis  Reproductive/Obstetrics                             Anesthesia Physical Anesthesia Plan  ASA: 3  Anesthesia Plan: Regional   Post-op Pain Management:    Induction:   PONV Risk Score and Plan: 1 and Ondansetron, Dexamethasone, Propofol infusion, Midazolam and Treatment may vary due to age or medical condition  Airway Management Planned: Simple Face Mask  Additional Equipment:   Intra-op Plan:   Post-operative Plan:   Informed Consent: I have reviewed the patients History and Physical, chart, labs and discussed the procedure including the risks, benefits and alternatives for the proposed anesthesia with the patient or authorized representative who has indicated his/her understanding and acceptance.     Dental advisory given  Plan Discussed with: CRNA  Anesthesia Plan Comments:        Anesthesia Quick Evaluation

## 2022-10-01 NOTE — Assessment & Plan Note (Addendum)
S/p 1st ray amputation on 5/11.  Continue IV Vanc until wound culture sensitivities result. Will de-escalate abx post-op pending wound cultures, and d/c home. -Orthopedics following, appreciate recs -Consolidate to Vancomycin, d/c CTX and Flagyl -F/u wound cultures and BC -Pain management: Tylenol prn -Restart Eliquis this morning

## 2022-10-01 NOTE — Op Note (Signed)
10/01/2022  9:04 AM  PATIENT:  Farris Has    PRE-OPERATIVE DIAGNOSIS:  Osteomyelitis  POST-OPERATIVE DIAGNOSIS:  Same  PROCEDURE:  LEFT FOOT AMPUTATION RAY 1st Application 1 g vancomycin powder. Application Kerecis micro graft 38 cm. Application of the Prevena wound VAC 13 cm dressing. Local tissue rearrangement for wound closure 4 x 11 cm.  SURGEON:  Nadara Mustard, MD  PHYSICIAN ASSISTANT:None ANESTHESIA:   General  PREOPERATIVE INDICATIONS:  Herson Perry is a  32 y.o. male with a diagnosis of Osteomyelitis who failed conservative measures and elected for surgical management.    The risks benefits and alternatives were discussed with the patient preoperatively including but not limited to the risks of infection, bleeding, nerve injury, cardiopulmonary complications, the need for revision surgery, among others, and the patient was willing to proceed.  OPERATIVE IMPLANTS:   Implant Name Type Inv. Item Serial No. Manufacturer Lot No. LRB No. Used Action  GRAFT SKIN WND MICRO 38 - [id number] Tissue GRAFT SKIN WND MICRO 38  [employer] [id number] Left 1 Implanted    @ENCIMAGES @  OPERATIVE FINDINGS: Patient had chronic osteomyelitis of the first metatarsal this in the soft tissue were sent for cultures.  The wound margins were clear.  OPERATIVE PROCEDURE: Patient was brought the operating room after undergoing a regional anesthetic.  The left lower extremity was then prepped using DuraPrep draped into a sterile field a timeout was called.  Elliptical incision was made around the ulcerative tissue and the first ray.  The first ray was resected through the base there was necrotic chronic changes of the metatarsal and this wound and the associated infected soft tissue were sent for cultures.  The wound was irrigated normal saline electrocautery was used for hemostasis.  The residual wound was 11 x 4 cm.  1 g vancomycin powder was applied to the wound as well as 38 cm of Kerecis  micro graft.  Local tissue rearrangement was used for wound closure after undermining the edges and closed with 2-0 nylon for a wound of 11 x 4 cm.  A Prevena 13 cm wound VAC was applied this had a good suction fit this was overwrapped with Coban patient was taken the PACU in stable condition.   DISCHARGE PLANNING:  Antibiotic duration: 24 hours postoperative antibiotics will discharge on oral antibiotics based on culture sensitivities.  If the cultures are back we will discharge on doxycycline.  Weightbearing: Touchdown weightbearing on the left  Pain medication: Opioid pathway  Dressing care/ Wound VAC: Discharge with the Praveena plus portable wound VAC pump  Ambulatory devices: Walker or crutches  Discharge to: Anticipate discharge to home.  Follow-up: In the office 1 week post operative.

## 2022-10-01 NOTE — Interval H&P Note (Signed)
History and Physical Interval Note:  10/01/2022 7:47 AM  Paul [name]  [name] presented today for surgery, with the diagnosis of Osteomyelitis.  The various methods of treatment have been discussed with the patient and family. After consideration of risks, benefits and other options for treatment, the patient [name] consented to  Procedure(s): LEFT FOOT AMPUTATION RAY 1st (Left) as a surgical intervention.  The patient's history [name] been reviewed, patient examined, no change in status, stable for surgery.  I have reviewed the patient's chart and labs.  Questions were answered to the patient's satisfaction.     Nadara Mustard

## 2022-10-01 NOTE — Evaluation (Signed)
Physical Therapy Evaluation Patient Details Name: Paul [name] MRN: [id number] DOB: [birth year] Today's Date: 10/01/2022  History of Present Illness  Pt is 32 yo male who presents on 09/29/22 with sepsis from L foot wound. Underwent L 1st ray amp on 10/01/22. PMH: ESRD, HTN, DM2, morbid obesity  Clinical Impression  Pt admitted with above diagnosis. Pt from home with his mother where his bedroom is up a flight of stairs, no downstairs bedroom option. Pt mobilizing well today with bariatric RW. Will need to practice stairs before d/c home. Rec bariatric RW and bariatric tub bench. Recommend HHPT for home safety eval.  Pt currently with functional limitations due to the deficits listed below (see PT Problem List). Pt will benefit from acute skilled PT to increase their independence and safety with mobility to allow discharge.          Recommendations for follow up therapy are one component of a multi-disciplinary discharge planning process, led by the attending physician.  Recommendations may be updated based on patient status, additional functional criteria and insurance authorization.  Follow Up Recommendations       Assistance Recommended at Discharge PRN  Patient can return home with the following  A little help with walking and/or transfers;A little help with bathing/dressing/bathroom;Assist for transportation;Help with stairs or ramp for entrance;Assistance with cooking/housework    Equipment Recommendations Other (comment);Rolling walker (2 wheels) (bariatric RW, bariatric tub bench)  Recommendations for Other Services       Functional Status Assessment Patient has had a recent decline in their functional status and demonstrates the ability to make significant improvements in function in a reasonable and predictable amount of time.     Precautions / Restrictions Precautions Precautions: Fall Restrictions Weight Bearing Restrictions: Yes LLE Weight Bearing: Touchdown weight bearing       Mobility  Bed Mobility Overal bed mobility: Modified Independent             General bed mobility comments: pt able to come to EOB without assist    Transfers Overall transfer level: Needs assistance Equipment used: Rolling walker (2 wheels) Transfers: Sit to/from Stand, Bed to chair/wheelchair/BSC Sit to Stand: Min guard   Step pivot transfers: Min guard       General transfer comment: vc's for hand placement and for keeping TDWB, esp with stand to sit. Min guard for safety. Bariatric RW used    Ambulation/Gait               General Gait Details: deferred further ambulation POD 0  Stairs            Wheelchair Mobility    Modified Rankin (Stroke Patients Only)       Balance Overall balance assessment: No apparent balance deficits (not formally assessed)                                           Pertinent Vitals/Pain Pain Assessment Pain Assessment: 0-10 Pain Score: 4  Pain Location: L foot Pain Descriptors / Indicators: Aching Pain Intervention(s): Limited activity within patient's tolerance, Monitored during session    Home Living Family/patient expects to be discharged to:: Private residence Living Arrangements: Parent Available Help at Discharge: Family;Available 24 hours/day Type of Home: House Home Access: Stairs to enter   Entrance Stairs-Number of Steps: 1 Alternate Level Stairs-Number of Steps: 12 Home Layout: Two level Home Equipment: None Additional Comments: lives  with mom who has had a stroke but is independent    Prior Function Prior Level of Function : Independent/Modified Independent                     Hand Dominance   Dominant Hand: Right    Extremity/Trunk Assessment   Upper Extremity Assessment Upper Extremity Assessment: Overall WFL for tasks assessed    Lower Extremity Assessment Lower Extremity Assessment: LLE deficits/detail LLE Deficits / Details: nerve block still in  effect, hip and knee WFL ROM and strength, no motion at ankle. Wound vac draining LLE Sensation: decreased light touch;decreased proprioception LLE Coordination: WNL    Cervical / Trunk Assessment Cervical / Trunk Assessment: Other exceptions Cervical / Trunk Exceptions: increased body habitus  Communication   Communication: No difficulties  Cognition Arousal/Alertness: Awake/alert Behavior During Therapy: WFL for tasks assessed/performed Overall Cognitive Status: Within Functional Limits for tasks assessed                                          General Comments General comments (skin integrity, edema, etc.): education given on proper elavtion and LLE elevated in recliner. Discussed stair options including use of B rails, though question whether they are strong enough to take his full wt, and sitting down which is likely his safer option    Exercises     Assessment/Plan    PT Assessment Patient needs continued PT services  PT Problem List Decreased mobility;Pain;Decreased knowledge of precautions;Decreased knowledge of use of DME       PT Treatment Interventions DME instruction;Gait training;Stair training;Functional mobility training;Therapeutic activities;Therapeutic exercise;Patient/family education    PT Goals (Current goals can be found in the Care Plan section)  Acute Rehab PT Goals Patient Stated Goal: return home PT Goal Formulation: With patient Time For Goal Achievement: 10/15/22 Potential to Achieve Goals: Good    Frequency Min 3X/week     Co-evaluation               AM-PAC PT "6 Clicks" Mobility  Outcome Measure Help needed turning from your back to your side while in a flat bed without using bedrails?: None Help needed moving from lying on your back to sitting on the side of a flat bed without using bedrails?: None Help needed moving to and from a bed to a chair (including a wheelchair)?: A Little Help needed standing up from a  chair using your arms (e.g., wheelchair or bedside chair)?: A Little Help needed to walk in hospital room?: A Little Help needed climbing 3-5 steps with a railing? : A Lot 6 Click Score: 19    End of Session Equipment Utilized During Treatment: Gait belt Activity Tolerance: Patient tolerated treatment well Patient left: in chair;with call bell/phone within reach Nurse Communication: Mobility status PT Visit Diagnosis: Pain;Difficulty in walking, not elsewhere classified (R26.2) Pain - Right/Left: Left Pain - part of body: Ankle and joints of foot    Time: 1356-1433 PT Time Calculation (min) (ACUTE ONLY): 37 min   Charges:   PT Evaluation $PT Eval Moderate Complexity: 1 Mod PT Treatments $Therapeutic Activity: 8-22 mins        Lyanne Co, PT  Acute Rehab Services Secure chat preferred Office [phone]   Lawana Chambers Morrigan Wickens 10/01/2022, 2:52 PM

## 2022-10-01 NOTE — Transfer of Care (Signed)
Immediate Anesthesia Transfer of Care Note  Patient: Paul [name]  Procedure(s) Performed: LEFT FOOT AMPUTATION RAY 1st (Left: Foot)  Patient Location: PACU  Anesthesia Type:MAC and Regional  Level of Consciousness: awake, alert , and oriented  Airway & Oxygen Therapy: Patient Spontanous Breathing and Patient connected to face mask oxygen  Post-op Assessment: Report given to RN and Post -op Vital signs reviewed and stable  Post vital signs: Reviewed and stable  Last Vitals:  Vitals Value Taken Time  BP 124/33 10/01/22 0900  Temp 36.4 C 10/01/22 0900  Pulse 97 10/01/22 0903  Resp 16 10/01/22 0903  SpO2 97 % 10/01/22 0903  Vitals shown include unvalidated device data.  Last Pain:  Vitals:   10/01/22 0744  TempSrc: Oral  PainSc: 0-No pain         Complications: No notable events documented.

## 2022-10-01 NOTE — Anesthesia Procedure Notes (Signed)
Anesthesia Regional Block: Popliteal block   Pre-Anesthetic Checklist: , timeout performed,  Correct Patient, Correct Site, Correct Laterality,  Correct Procedure, Correct Position, site marked,  Risks and benefits discussed,  Surgical consent,  Pre-op evaluation,  At surgeon's request and post-op pain management  Laterality: Left  Prep: chloraprep       Needles:  Injection technique: Single-shot  Needle Type: Echogenic Stimulator Needle     Needle Length: 10cm  Needle Gauge: 20     Additional Needles:   Procedures:,,,, ultrasound used (permanent image in chart),,    Narrative:  Start time: 10/01/2022 7:40 AM End time: 10/01/2022 7:50 AM Injection made incrementally with aspirations every 5 mL.  Performed by: Personally  Anesthesiologist: Leonides Grills, MD  Additional Notes: Functioning IV was confirmed and monitors were applied.  A timeout was performed. Sterile prep, hand hygiene and sterile gloves were used. A 20ga Bbraun echogenic stimulator needle was used. Negative aspiration and negative test dose prior to incremental administration of local anesthetic. The patient tolerated the procedure well.  Ultrasound guidance: relevent anatomy identified, needle position confirmed, local anesthetic spread visualized around nerve(s), vascular puncture avoided.  Image printed for medical record.

## 2022-10-01 NOTE — Anesthesia Postprocedure Evaluation (Signed)
Anesthesia Post Note  Patient: Paul [name]  Procedure(s) Performed: LEFT FOOT AMPUTATION RAY 1st (Left: Foot)     Patient location during evaluation: PACU Anesthesia Type: Regional Level of consciousness: awake Pain management: pain level controlled Vital Signs Assessment: post-procedure vital signs reviewed and stable Respiratory status: spontaneous breathing, nonlabored ventilation and respiratory function stable Cardiovascular status: blood pressure returned to baseline and stable Postop Assessment: no apparent nausea or vomiting Anesthetic complications: no   No notable events documented.  Last Vitals:  Vitals:   10/01/22 2000 10/01/22 2030  BP: 100/65 92/74  Pulse: 91 (!) 29  Resp: 13 14  Temp:  36.8 C  SpO2: 100% 97%    Last Pain:  Vitals:   10/01/22 2030  TempSrc: Oral  PainSc:                  Benjamyn Hestand P Quentyn Kolbeck

## 2022-10-02 ENCOUNTER — Encounter (HOSPITAL_COMMUNITY): Payer: Self-pay | Admitting: Orthopedic Surgery

## 2022-10-02 DIAGNOSIS — R6520 Severe sepsis without septic shock: Secondary | ICD-10-CM | POA: Diagnosis not present

## 2022-10-02 DIAGNOSIS — Z992 Dependence on renal dialysis: Secondary | ICD-10-CM | POA: Diagnosis not present

## 2022-10-02 DIAGNOSIS — M86172 Other acute osteomyelitis, left ankle and foot: Secondary | ICD-10-CM | POA: Diagnosis not present

## 2022-10-02 DIAGNOSIS — Z794 Long term (current) use of insulin: Secondary | ICD-10-CM | POA: Diagnosis not present

## 2022-10-02 DIAGNOSIS — A419 Sepsis, unspecified organism: Secondary | ICD-10-CM | POA: Diagnosis not present

## 2022-10-02 DIAGNOSIS — E1122 Type 2 diabetes mellitus with diabetic chronic kidney disease: Secondary | ICD-10-CM | POA: Diagnosis not present

## 2022-10-02 DIAGNOSIS — N186 End stage renal disease: Secondary | ICD-10-CM | POA: Diagnosis not present

## 2022-10-02 LAB — CBC
HCT: 41.4 % (ref 39.0–52.0)
Hemoglobin: 13.4 g/dL (ref 13.0–17.0)
MCH: 26 pg (ref 26.0–34.0)
MCHC: 32.4 g/dL (ref 30.0–36.0)
MCV: 80.4 fL (ref 80.0–100.0)
Platelets: 261 10*3/uL (ref 150–400)
RBC: 5.15 MIL/uL (ref 4.22–5.81)
RDW: 17.6 % — ABNORMAL HIGH (ref 11.5–15.5)
WBC: 10.6 10*3/uL — ABNORMAL HIGH (ref 4.0–10.5)
nRBC: 0 % (ref 0.0–0.2)

## 2022-10-02 LAB — GLUCOSE, CAPILLARY
Glucose-Capillary: 194 mg/dL — ABNORMAL HIGH (ref 70–99)
Glucose-Capillary: 173 mg/dL — ABNORMAL HIGH (ref 70–99)

## 2022-10-02 LAB — AEROBIC/ANAEROBIC CULTURE W GRAM STAIN (SURGICAL/DEEP WOUND)

## 2022-10-02 MED ORDER — OXYCODONE HCL 5 MG PO TABS: 5.0000 mg | ORAL_TABLET | Freq: Four times a day (QID) | ORAL | 0 refills | Status: DC | PRN

## 2022-10-02 MED ORDER — ZINC SULFATE 220 (50 ZN) MG PO CAPS: 220.0000 mg | ORAL_CAPSULE | Freq: Every day | ORAL | 0 refills | Status: DC

## 2022-10-02 MED ORDER — DOXYCYCLINE HYCLATE 100 MG PO TABS: 100.0000 mg | ORAL_TABLET | Freq: Two times a day (BID) | ORAL | 0 refills | Status: DC

## 2022-10-02 MED ORDER — ASCORBIC ACID 1000 MG PO TABS: 1000.0000 mg | ORAL_TABLET | Freq: Every day | ORAL | 0 refills | Status: DC

## 2022-10-02 MED ORDER — POLYETHYLENE GLYCOL 3350 17 G PO PACK: 17.0000 g | PACK | Freq: Every day | ORAL | 0 refills | Status: DC | PRN

## 2022-10-02 MED ORDER — DOXYCYCLINE HYCLATE 100 MG PO TABS: 100.0000 mg | ORAL_TABLET | Freq: Two times a day (BID) | ORAL | Status: DC

## 2022-10-02 NOTE — Plan of Care (Signed)
  Problem: Education: Goal: Knowledge of General Education information will improve Description: Including pain rating scale, medication(s)/side effects and non-pharmacologic comfort measures Outcome: Adequate for Discharge   Problem: Health Behavior/Discharge Planning: Goal: Ability to manage health-related needs will improve Outcome: Adequate for Discharge   Problem: Clinical Measurements: Goal: Ability to maintain clinical measurements within normal limits will improve Outcome: Adequate for Discharge Goal: Will remain free from infection Outcome: Adequate for Discharge Goal: Diagnostic test results will improve Outcome: Adequate for Discharge Goal: Respiratory complications will improve Outcome: Adequate for Discharge Goal: Cardiovascular complication will be avoided Outcome: Adequate for Discharge   Problem: Activity: Goal: Risk for activity intolerance will decrease Outcome: Adequate for Discharge   Problem: Nutrition: Goal: Adequate nutrition will be maintained Outcome: Adequate for Discharge   Problem: Coping: Goal: Level of anxiety will decrease Outcome: Adequate for Discharge   Problem: Elimination: Goal: Will not experience complications related to bowel motility Outcome: Adequate for Discharge Goal: Will not experience complications related to urinary retention Outcome: Adequate for Discharge   Problem: Pain Managment: Goal: General experience of comfort will improve Outcome: Adequate for Discharge   Problem: Safety: Goal: Ability to remain free from injury will improve Outcome: Adequate for Discharge   Problem: Skin Integrity: Goal: Risk for impaired skin integrity will decrease Outcome: Adequate for Discharge   Problem: Education: Goal: Ability to describe self-care measures that may prevent or decrease complications (Diabetes Survival Skills Education) will improve Outcome: Adequate for Discharge Goal: Individualized Educational Video(s) Outcome:  Adequate for Discharge   Problem: Coping: Goal: Ability to adjust to condition or change in health will improve Outcome: Adequate for Discharge   Problem: Fluid Volume: Goal: Ability to maintain a balanced intake and output will improve Outcome: Adequate for Discharge   Problem: Health Behavior/Discharge Planning: Goal: Ability to identify and utilize available resources and services will improve Outcome: Adequate for Discharge Goal: Ability to manage health-related needs will improve Outcome: Adequate for Discharge   Problem: Metabolic: Goal: Ability to maintain appropriate glucose levels will improve Outcome: Adequate for Discharge   Problem: Nutritional: Goal: Maintenance of adequate nutrition will improve Outcome: Adequate for Discharge Goal: Progress toward achieving an optimal weight will improve Outcome: Adequate for Discharge   Problem: Skin Integrity: Goal: Risk for impaired skin integrity will decrease Outcome: Adequate for Discharge   Problem: Tissue Perfusion: Goal: Adequacy of tissue perfusion will improve Outcome: Adequate for Discharge   Problem: Education: Goal: Knowledge of the prescribed therapeutic regimen will improve Outcome: Adequate for Discharge Goal: Ability to verbalize activity precautions or restrictions will improve Outcome: Adequate for Discharge Goal: Understanding of discharge needs will improve Outcome: Adequate for Discharge   Problem: Activity: Goal: Ability to perform//tolerate increased activity and mobilize with assistive devices will improve Outcome: Adequate for Discharge   Problem: Clinical Measurements: Goal: Postoperative complications will be avoided or minimized Outcome: Adequate for Discharge   Problem: Self-Care: Goal: Ability to meet self-care needs will improve Outcome: Adequate for Discharge   Problem: Self-Concept: Goal: Ability to maintain and perform role responsibilities to the fullest extent possible will  improve Outcome: Adequate for Discharge   Problem: Pain Management: Goal: Pain level will decrease with appropriate interventions Outcome: Adequate for Discharge   Problem: Skin Integrity: Goal: Demonstration of wound healing without infection will improve Outcome: Adequate for Discharge

## 2022-10-02 NOTE — Progress Notes (Signed)
Mobility Specialist Progress Note    10/02/22 0941  Mobility  Activity Ambulated with assistance in room  Level of Assistance Contact guard assist, steadying assist  Assistive Device Front wheel walker;Crutches  Distance Ambulated (ft) 50 ft  LLE Weight Bearing TWB  Activity Response Tolerated well  Mobility Referral Yes  $Mobility charge 1 Mobility  Mobility Specialist Start Time (ACUTE ONLY) [id number]  Mobility Specialist Stop Time (ACUTE ONLY) 0941  Mobility Specialist Time Calculation (min) (ACUTE ONLY) 25 min   Pt received in bed and agreeable. No complaints. Pt required reminders and cueing for TWB maintenance and safety with AD. Pt preferred crutches for gait. Left sitting EOB with call bell in reach and MD present.     [place] Mobility Specialist  Please Neurosurgeon or Rehab Office at [phone]

## 2022-10-02 NOTE — Progress Notes (Signed)
[facility] Progress Note   Subjective:  Seen in room - bloody output in wound vac canister. HD went fine yesterday, no CP/dyspnea.  Objective Vitals:   10/02/22 0315 10/02/22 0318 10/02/22 0715 10/02/22 0719  BP: 93/60 93/60 (!) 146/96 (!) 171/91  Pulse: 96   91  Resp: 18     Temp: 98.5 F (36.9 C)  97.7 F (36.5 C)   TempSrc: Oral  Axillary   SpO2: 100%     Weight:       Physical Exam General: Well appearing man, NAD. Room air Heart: RRR; no murmur Lungs: CTA anteriorly Abdomen: soft Extremities: no LE edema; L foot bandaged with wound vac in place Dialysis Access: LUE AVG + bruit  Additional Objective Labs: Basic Metabolic Panel: Recent Labs  Lab 09/29/22 1219 09/30/22 0033 10/01/22 0026  NA 133* 135 134*  K 3.8 3.6 3.6  CL 93* 93* 94*  CO2 20* 24 22  GLUCOSE 159* 187* 182*  BUN 28* 40* 57*  CREATININE 11.31* 12.87* 15.17*  CALCIUM 9.6 9.0 8.1*  PHOS  --  5.1*  --    Liver Function Tests: Recent Labs  Lab 09/29/22 1219 09/30/22 0033  AST 34  --   ALT 23  --   ALKPHOS 72  --   BILITOT 0.8  --   PROT 9.2*  --   ALBUMIN 4.4 3.3*   Recent Labs  Lab 09/29/22 1219  LIPASE 47   CBC: Recent Labs  Lab 09/29/22 1219 09/30/22 0033 10/01/22 0026 10/01/22 1606 10/02/22 0021  WBC 8.8 7.1 8.1 8.5 10.6*  HGB 16.6 14.4 13.3 12.3* 13.4  HCT 54.8* 43.2 40.5 37.9* 41.4  MCV 88.7 81.8 81.5 81.3 80.4  PLT 219 222 242 241 261   Blood Culture    Component Value Date/Time   SDES TISSUE 10/01/2022 0833   SPECREQUEST LEFT FOOT 10/01/2022 0833   CULT PENDING 10/01/2022 0833   REPTSTATUS PENDING 10/01/2022 0833   Studies/Results: MR FOOT LEFT W WO CONTRAST  Result Date: 09/30/2022 CLINICAL DATA:  Left foot infection. EXAM: MRI OF THE LEFT FOREFOOT WITHOUT AND WITH CONTRAST TECHNIQUE: Multiplanar, multisequence MR imaging of the left foot was performed both before and after administration of intravenous contrast. CONTRAST:  10mL GADAVIST  GADOBUTROL 1 MMOL/ML IV SOLN COMPARISON:  Left foot x-rays dated Sep 29, 2022. MRI left foot dated May 07, 2021. FINDINGS: Bones/Joint/Cartilage Prior first ray amputation abnormal marrow edema and enhancement involving the residual first metatarsal. 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 Mild marrow edema and cystic change in the third metatarsal head with subchondral collapse, consistent with avascular necrosis. 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  No joint effusion. Ligaments Second through fifth toe collateral ligaments are intact. Lisfranc ligament is intact. Muscles and Tendons Postsurgical changes of the first flexor and extensor tendons. Remaining flexor and extensor tendons are intact. No tenosynovitis. Increased T2 signal within the intrinsic muscles of the forefoot, nonspecific, but likely related to diabetic muscle changes. Soft tissue Medial plantar ulceration along the forefoot with sinus tract extending to the residual first metatarsal. 1.4 x 1.8 x 1.7 cm bilobed rim enhancing fluid collection at the plantar aspect of the residual first metatarsal tip, communicating with the sinus tract. No soft tissue mass. IMPRESSION: 1. Medial plantar ulceration along the forefoot with sinus tract extending to the residual first metatarsal. Osteomyelitis of the residual first metatarsal with 1.8 cm abscess along the  plantar aspect, communicating with the sinus tract. 2. 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 3. 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 Electronically Signed   By: Obie Dredge M.D.   On: 09/30/2022 11:22   Medications:  magnesium sulfate bolus IVPB     vancomycin 1,500 mg (10/01/22 1852)    (feeding supplement) PROSource Plus  30 mL Oral BID BM   apixaban  5 mg Oral BID   vitamin C  1,000 mg Oral Daily   cinacalcet  30 mg Oral Daily   docusate  sodium  100 mg Oral Daily   doxercalciferol  3 mcg Intravenous Q T,Th,Sa-HD   ferric citrate  840 mg Oral TID with meals   gabapentin  100 mg Oral TID   gabapentin  300 mg Oral QHS   insulin aspart  0-6 Units Subcutaneous TID WC   levothyroxine  150 mcg Oral QAC breakfast   midodrine  5 mg Oral Q T,Th,Sa-HD   multivitamin  1 tablet Oral QHS   nutrition supplement (JUVEN)  1 packet Oral BID BM   pantoprazole  40 mg Oral Daily   rosuvastatin  20 mg Oral Daily   zinc sulfate  220 mg Oral Daily    Dialysis Orders: TTS at [facility] 4:30hr, 200dialyzer, BFR 450/A1.5, EDW 153kg, 2K/2.5Ca, AVG, 14g needles - Heparin 12,000 unit initial + 6000 unit mid-run bolus - No ESA - Hectoral IV q HD - Binder: Auryxia 4/meals, Sensipar 30mg  QD   Assessment/Plan: Sepsis/L foot osteomyelitis: S/p 1st ray amputation 5/11 - wound Cx pending. Remains on Vancomycin.  ESRD:  Continue HD per usual TTS schedule -> next HD 5/14.  Hypertension/volume: BP higher today, no LE edema. Up per weights, although seems inaccurate. Uses midodrine pre-HD.  Metabolic bone disease: Ca/Phos ok - continue home sensipar, Auryxia, VDRA.  Nutrition:  Continue supplement for wound healing.  T2DM  Paul [name], Cordelia Poche 10/02/2022, 8:36 AM  [employer]

## 2022-10-02 NOTE — Progress Notes (Signed)
Orthopedic Tech Progress Note Patient Details:  Paul [name] 20-Dec-1990 [id number]  Ortho Devices Type of Ortho Device: Postop shoe/boot Ortho Device/Splint Location: lle Ortho Device/Splint Interventions: Ordered, Application, Adjustment   Post Interventions Patient Tolerated: Well Instructions Provided: Care of device, Adjustment of device  Trinna Post 10/02/2022, 6:18 AM

## 2022-10-02 NOTE — Progress Notes (Addendum)
Pharmacy Antibiotic Note  Paul [name] is a 32 y.o. male admitted on 09/29/2022 with cellulitis. Pt has a chronic ulcer of the L foot and is followed by orthopedic surgery outpatient. Pharmacy has been consulted for Vancomycin dosing.  Pt has ESRD and receives HD TTS. WBC up to 10.6 and afebrile. Patient received additional vancomycin with amputation 5/11. Will check vanc levels later this week after giving time to equilibrate.   Plan: Vancomycin was started at1500mg  IV every TTS with dialysis due to BW. Will continue current dose for now and get levels at steady state.    > Goal trough level 10-15 mcg/mL  Monitor daily CBC, temp, SCr, and for clinical signs of improvement  F/u cultures and de-escalate antibiotics as able   Weight: (!) 165.7 kg (365 lb 4.8 oz)  Temp (24hrs), Avg:98.3 F (36.8 C), Min:97.7 F (36.5 C), Max:98.8 F (37.1 C)  Recent Labs  Lab 09/29/22 1218 09/29/22 1219 09/29/22 1250 09/29/22 1808 09/30/22 0033 10/01/22 0026 10/01/22 1606 10/02/22 0021  WBC  --  8.8  --   --  7.1 8.1 8.5 10.6*  CREATININE  --  11.31*  --   --  12.87* 15.17*  --   --   LATICACIDVEN 4.2*  --  3.4* 1.3  --   --   --   --      Estimated Creatinine Clearance: 11.6 mL/min (A) (by C-G formula based on SCr of 15.17 mg/dL (H)).    Allergies  Allergen Reactions   Hydralazine Hcl Other (See Comments)    DRUG-INDUCED LUPUS    Antimicrobials this admission: Ceftriaxone 5/9 >> 5/10 Cefazolin 5/11   Vancomycin 5/9 >>   Dose adjustments this admission: N/A  Microbiology results: 5/9 BCx: NGTD 5/11 Wound Cx: rare GPCs  Thank you for involving pharmacy in this patient's care.  Enos Fling, PharmD PGY2 Pharmacy Resident 10/02/2022 9:29 AM

## 2022-10-02 NOTE — Discharge Instructions (Addendum)
You were admitted for osteomyelitis (infection of bone). Your were treated with antibiotics and had orthopedic surgery with partial amputation with Dr. Lajoyce Corners.  Please follow these instructions at home: Complete your total course of antibiotic called "Doxycycline." Take this twice daily with food. You may use crutches or a walker  Keep your portable wound vac on until you have your office visit with orthopedic surgery at [facility] on 10/05/2022 at 9:00 AM. You may take your home pain medication plus additional Tylenol for pain. Do not take more than 2,000 mg of Tylenol in 24 hours.  Take your other home medications as prescribed. Please call your doctor if you develop fever, chills, nausea or vomiting that is intractable.

## 2022-10-02 NOTE — Progress Notes (Signed)
Patient had discharge paperwork gone over with all questions answered, ivs removed and wheel chaired out.

## 2022-10-02 NOTE — Plan of Care (Signed)
[facility] Patient Discharge Orders- [facility] CLINIC: [place]  Patient's name: Paul [name] Admit/DC Dates: 09/29/2022 - 10/02/2022  Discharge Diagnoses: L foot osteomyelitis with abscess - s/p L 1st ray amputation with wound vac placement 10/01/22 by Dr. Lajoyce Corners Sepsis  Aranesp: Given: no    Last Hgb: 13.4 PRBC's Given: no ESA dose for discharge: none IV Iron dose at discharge: none  Heparin change: no  EDW Change: no New EDW:   [place]ath Change: no  Access intervention/Change: no Details:  Hectorol/Calcitriol change: no  Discharge Labs: Calcium 8.1 Phosphorus 5.1 Albumin 3.3 K+ 3.6  IV Antibiotics: no - will be completing 14-d course of PO antibiotics (doxycycline) Details:  On Coumadin?: no Managed By:   OTHER/APPTS/LAB ORDERS: - Will be on crutches - non-weight bearing on L foot. Assist patient as needed. - Please follow-up on surgical Cx from 10/01/22 to assure that doxycycline will provide coverage - Will have f/u appt with Dr. Lajoyce Corners in 1 week    D/C Meds to be reconciled by nurse after every discharge.  Completed By: Ozzie Hoyle, PA-C [facility] Pager [phone]  Reviewed by: MD:______ RN_______

## 2022-10-02 NOTE — Discharge Summary (Addendum)
Family Medicine Teaching [facility] Discharge Summary  Patient name: Paul [name] Medical record number: [id number] Date of birth: December 18, 1990 Age: 32 y.o. Gender: male Date of Admission: 09/29/2022  Date of Discharge: 10/02/22 Admitting Physician: Tiffany Kocher, DO  Primary Care Provider: Darral Dash, DO Consultants: Orthopedic surgery  Indication for Hospitalization: Diabetic foot wound  Discharge Diagnoses/Problem List:  Active Problems:   Type 2 diabetes mellitus (HCC)   ESRD on dialysis [facility])   Acute osteomyelitis of left foot (HCC)   Principal Problem for Admission: Osteomyelitis  Brief Hospital Course:  Paul [name] is a 32 y.o.male with a history of  ESRD on HD T/Th/Sa, T2DM, GERD, HLD, HTN, hypothyroidism, on [facility] for h/o left brachial artery occlusion s/p embolectomy who was admitted to the [facility] Medicine Teaching Service at [facility] for left foot wound. His hospital course is detailed below:  Left foot osteomyelitis Initially presented meeting sepsis criteria with fever, tachycardia, hypotension and diabetic foot wound.  Treated with antibiotics as follows: Ceftriaxone 5/9 >> 5/10 Cefazolin 5/11; Vancomycin 5/9 >> 5/12.   Osteomyelitis of residual first metatarsal with abscess confirmed with MRI of the left foot with avascular necrosis of third metatarsal head. Orthopedic surgery performed left first ray amputation on 5/11.  No complications.  He was able to minimize with bariatric RW with PT after surgery. Discharged home with Prevena portable wound VAC.  He is to use walker or crutches for ambulation with touchdown weightbearing on the left.  ESRD on dialysis On TTS schedule.  Nephrology consulted and patient received HD on 5/11, tolerated well with full 4-hour session. Pre-HD midodrine continued. Home Eliquis was continued, but held preoperatively.  Resumed on 5/12.   Other chronic conditions were medically managed with home medications and formulary  alternatives as necessary (T2DM, hypothyroidism)  PCP Follow-up Recommendations: F/u with Ortho (Dr. Lajoyce Corners) in 1 week Discharge home with home health PT Wound culture with sensitivities pending at time of discharge Ensure completion of doxycycline 14-day course (start date 5/12)  Disposition: Home with [facility] PT  Discharge Condition: Stable   Discharge Exam:  Vitals:   10/02/22 1045 10/02/22 1048  BP: (!) 176/31 (!) 158/102  Pulse:  95  Resp:    Temp: 98.2 F (36.8 C)   SpO2:    General: Well-appearing, pleasant, obese CV: Regular rate and rhythm Respiratory: Normal work of breathing on room air Left lower extremity: Wound VAC in place with clean, dry intact dressing with Coban wrap  Significant Procedures: Left 1st ray amputation  Significant Labs and Imaging:  Recent Labs  Lab 10/01/22 0026 10/01/22 1606 10/02/22 0021  WBC 8.1 8.5 10.6*  HGB 13.3 12.3* 13.4  HCT 40.5 37.9* 41.4  PLT 242 241 261   Recent Labs  Lab 10/01/22 0026  NA 134*  K 3.6  CL 94*  CO2 22  GLUCOSE 182*  BUN 57*  CREATININE 15.17*  CALCIUM 8.1*   MRI left foot: IMPRESSION: 1. Medial plantar ulceration along the forefoot with sinus tract extending to the residual first metatarsal. Osteomyelitis of the residual first metatarsal with 1.8 cm abscess along the plantar aspect, communicating with the sinus tract. 2. 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 3. 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  Results/Tests Pending at Time of Discharge: Aerobic/anaerobic wound culture and sensitivities  Discharge Medications:  Allergies as of 10/02/2022       Reactions   Hydralazine Hcl Other (See Comments)   DRUG-INDUCED LUPUS  Medication List     STOP taking these medications    acetaminophen 500 MG tablet Commonly known as: TYLENOL   guaiFENesin 100 MG/5ML liquid Commonly known as: ROBITUSSIN       TAKE these medications    Accu-Chek  Guide Me w/Device Kit 1 kit by Does not apply route daily.   Accu-Chek Guide test strip Generic drug: glucose blood 1 each by Other route 2 (two) times daily. Use as instructed   Accu-Chek Softclix Lancets lancets Use as instructed   ascorbic acid 1000 MG tablet Commonly known as: VITAMIN C Take 1 tablet (1,000 mg total) by mouth daily. Start taking on: Oct 03, 2022   aspirin EC 81 MG tablet Take 1 tablet (81 mg total) by mouth daily. Swallow whole.   B-D SYRINGE SLIP TIP 30CC 30 ML Misc Generic drug: Syringe (Disposable) 1 Syringe by Does not apply route daily.   calcium carbonate 500 MG chewable tablet Commonly known as: TUMS - dosed in mg elemental calcium Chew 1,000 mg by mouth daily as needed for indigestion or heartburn.   cetirizine 10 MG tablet Commonly known as: ZYRTEC Take 1 tablet (10 mg total) by mouth daily as needed for allergies.   cinacalcet 30 MG tablet Commonly known as: SENSIPAR Take 30 mg by mouth daily.   doxycycline 100 MG tablet Commonly known as: VIBRA-TABS Take 1 tablet (100 mg total) by mouth 2 (two) times daily.   doxycycline 100 MG tablet Commonly known as: VIBRA-TABS Take 1 tablet (100 mg total) by mouth every 12 (twelve) hours. Start taking on: Oct 03, 2022   Eliquis 5 MG Tabs tablet Generic drug: apixaban Take 1 tablet by mouth twice daily   ferric citrate 1 GM 210 MG(Fe) tablet Commonly known as: AURYXIA Take 2-4 tablets (420-840 mg total) by mouth See admin instructions. Take 4 tablets (840 mg) by mouth with each meal & take 2 tablets (420 mg) by mouth with each snack.   gabapentin 100 MG capsule Commonly known as: NEURONTIN TAKE 1 CAPSULE BY MOUTH THREE TIMES DAILY   gabapentin 300 MG capsule Commonly known as: NEURONTIN Take 1 capsule by mouth at bedtime   HYDROcodone-acetaminophen 5-325 MG tablet Commonly known as: NORCO/VICODIN Take 1 tablet by mouth every 8 (eight) hours as needed for moderate pain.   Lantus 100  UNIT/ML injection Generic drug: insulin glargine Inject 0.05 mLs (5 Units total) into the skin 2 (two) times daily.   levothyroxine 150 MCG tablet Commonly known as: SYNTHROID TAKE 1 TABLET BY MOUTH ONCE DAILY BEFORE BREAKFAST *APPOINTMENT NEEDED FOR FURTHER REFILLS* What changed: See the new instructions.   midodrine 5 MG tablet Commonly known as: PROAMATINE Take 1 tablet (5 mg total) by mouth Every Tuesday,Thursday,and Saturday with dialysis.   multivitamin Tabs tablet Take 1 tablet by mouth at bedtime.   polyethylene glycol 17 g packet Commonly known as: MIRALAX / GLYCOLAX Take 17 g by mouth daily as needed for mild constipation.   rosuvastatin 20 MG tablet Commonly known as: CRESTOR Take 1 tablet by mouth once daily   zinc sulfate 220 (50 Zn) MG capsule Take 1 capsule (220 mg total) by mouth daily. Start taking on: Oct 03, 2022               Durable Medical Equipment  (From admission, onward)           Start     Ordered   10/02/22 1022  For home use only DME Vac  Once  Comments: Attach the wound vac dressing to a portable prevena plus vac for discharge. The pump is available in the OR or from central supply. Show patient how to plug it in to charge. Vac and dressing to remain dry   10/02/22 1023            Discharge Instructions: Please refer to Patient Instructions section of EMR for full details.  Patient was counseled important signs and symptoms that should prompt return to medical care, changes in medications, dietary instructions, activity restrictions, and follow up appointments.   Follow-Up Appointments:  Follow-up Information     Nadara Mustard, MD Follow up in 1 week(s).   Specialty: Orthopedic Surgery Contact information: [address] [place] [address] [phone]                 Darral Dash, DO 10/02/2022, 11:10 AM PGY-2, [facility]

## 2022-10-02 NOTE — Progress Notes (Signed)
Orthopedic Tech Progress Note Patient Details:  Paul [name] 1991/01/13 [id number]  Crutches adjusted and at bedside for use. Pt had worked with mobility prior to delivering crutches and felt comfortable ambulating with them.  Ortho Devices Type of Ortho Device: Crutches Ortho Device/Splint Location: adjusted, at bedside Ortho Device/Splint Interventions: Ordered, Application, Adjustment   Post Interventions Patient Tolerated: Well, Ambulated well Instructions Provided: Poper ambulation with device, Adjustment of device, Care of device  Analina Filla Carmine Savoy 10/02/2022, 2:12 PM

## 2022-10-03 ENCOUNTER — Telehealth: Payer: Self-pay

## 2022-10-03 ENCOUNTER — Other Ambulatory Visit: Payer: Self-pay | Admitting: Orthopedic Surgery

## 2022-10-03 LAB — GLUCOSE, CAPILLARY: Glucose-Capillary: 167 mg/dL — ABNORMAL HIGH (ref 70–99)

## 2022-10-03 LAB — AEROBIC/ANAEROBIC CULTURE W GRAM STAIN (SURGICAL/DEEP WOUND)

## 2022-10-03 MED ORDER — CIPROFLOXACIN HCL 500 MG PO TABS
500.0000 mg | ORAL_TABLET | Freq: Two times a day (BID) | ORAL | 0 refills | Status: DC
Start: 2022-10-03 — End: 2023-04-24

## 2022-10-03 NOTE — Telephone Encounter (Signed)
Pt nephrology NP C. Dareen Piano spoke with pt earlier today to advise of the change in ABX. She has also alerted HD center to make sure the pt is aware when he goes for his visit tomorrow. I have tried to call the pt to confirm that he picks up the rx and have not been successful x 2 this afternoon. Rebbeca Paul NP advised she will give pt our number to call. Will hold this message to follow up tomorrow.

## 2022-10-03 NOTE — Progress Notes (Signed)
Late Note Entry  Pt was d/c to home yesterday. Contacted [facility] this morning to advise clinic of pt's d/c yesterday and that pt will should resume care tomorrow.   Olivia Canter Renal Navigator [phone]

## 2022-10-03 NOTE — Telephone Encounter (Signed)
-----   Message from Nadara Mustard, MD sent at 10/03/2022 11:06 AM EDT ----- Cultures are showing a sensitive staph bacteria.  Prescription sent for Cipro to obtain better bone penetration. ----- Message ----- From: Interface, Lab In [place] Sent: 10/03/2022   9:01 AM EDT To: Nadara Mustard, MD

## 2022-10-03 NOTE — TOC Transition Note (Signed)
Transition of Care - Initial Contact from Inpatient Facility  Date of discharge: 10/02/22 Date of contact: 10/03/22  Method: Phone Spoke to: Patient's Mother  Patient contacted to discuss transition of care from recent inpatient hospitalization. Patient was admitted to [facility] from 09/29/22-10/02/22 with discharge diagnosis of L foot osteo with abscess-s/p L 1st ray amputation with wound vac placement 10/01/22 by Dr. Lajoyce Corners.  The discharge medication list was reviewed.   Noted patient was discharged home on 14-day course PO Doxy; however, blood culture from 5/11 tested (+) for staph bacteria. Noted message sent from Dr. Audrie Lia office today for patient to start Cipro 500mg  BID X 10 days. Per recent notes, RN from Dr. Audrie Lia office tried calling patient but unfortunately his voicemail has not been set up yet. I called patient's home number [phone]) and discussed with patient's Mother on recent ABX change. I alerted Dr. Audrie Lia RN of me updating patient's Mother. I also alerted patient's outpatient HD center so HD staff can verify with patient during his scheduled  HD tomorrow to ensure he is now on the Cipro regimen. I advised HD staff for patient to call Dr. Audrie Lia office [phone]) if he didn't receive the Cipro regimen.   Patient will return to his outpatient HD unit on: Tuesday May 14th 2024 at [facility].  Salome Holmes, NP

## 2022-10-03 NOTE — Telephone Encounter (Signed)
I tried to call pt to advise of ABx and the pt's vm has not been set up yet. Will hold this message and try again later.

## 2022-10-04 ENCOUNTER — Other Ambulatory Visit: Payer: Self-pay | Admitting: Orthopedic Surgery

## 2022-10-04 ENCOUNTER — Telehealth: Payer: Self-pay | Admitting: Family

## 2022-10-04 ENCOUNTER — Ambulatory Visit: Payer: Self-pay | Admitting: Family Medicine

## 2022-10-04 LAB — AEROBIC/ANAEROBIC CULTURE W GRAM STAIN (SURGICAL/DEEP WOUND)

## 2022-10-04 LAB — CULTURE, BLOOD (ROUTINE X 2)
Special Requests: ADEQUATE
Special Requests: ADEQUATE

## 2022-10-04 MED ORDER — OXYCODONE-ACETAMINOPHEN 5-325 MG PO TABS: 1.0000 | ORAL_TABLET | ORAL | 0 refills | Status: DC | PRN

## 2022-10-04 NOTE — Telephone Encounter (Signed)
Patient requesting Oxycodone refill sent to [facility] on [address]

## 2022-10-04 NOTE — Telephone Encounter (Signed)
Correction: he only received #10 tablets at discharge on 10/02/22 S/p first ray amputation on 10/02/22. Please advise on refill.

## 2022-10-04 NOTE — Telephone Encounter (Signed)
Oxycodone was sent on 10/02/22 to this pharmacy. Too soon for refill.

## 2022-10-04 NOTE — Progress Notes (Deleted)
    SUBJECTIVE:   CHIEF COMPLAINT / HPI:   Hospital follow-up Admitted 5/9-5/12 with osteomyelitis of left foot. Underwent left first ray amputation on 5/11.  Per discharge summary- PCP Follow-up Recommendations: 1. F/u with Ortho (Dr. Lajoyce Corners) in 1 week 2. Discharge home with home health PT 3. Wound culture with sensitivities pending at time of discharge 4. Ensure completion of doxycycline 14-day course (start date 5/12)   PERTINENT  PMH / PSH: ***  OBJECTIVE:   There were no vitals taken for this visit.  ***  ASSESSMENT/PLAN:   No problem-specific Assessment & Plan notes found for this encounter.     Maury Dus, MD [facility]

## 2022-10-05 ENCOUNTER — Encounter: Payer: Medicaid Other | Admitting: Family

## 2022-10-05 NOTE — Telephone Encounter (Signed)
Not able to leave message. No return calls. Pt has an appt on 10/12/2022.

## 2022-10-06 ENCOUNTER — Emergency Department (HOSPITAL_COMMUNITY)
Admission: EM | Admit: 2022-10-06 | Discharge: 2022-10-06 | Disposition: A | Discharge status: Home / Self Care | Payer: Medicaid Other | Attending: Student | Admitting: Student

## 2022-10-06 ENCOUNTER — Other Ambulatory Visit: Payer: Self-pay

## 2022-10-06 ENCOUNTER — Encounter (HOSPITAL_COMMUNITY): Payer: Self-pay | Admitting: Emergency Medicine

## 2022-10-06 DIAGNOSIS — Z4689 Encounter for fitting and adjustment of other specified devices: Secondary | ICD-10-CM

## 2022-10-06 DIAGNOSIS — Z4801 Encounter for change or removal of surgical wound dressing: Secondary | ICD-10-CM | POA: Diagnosis not present

## 2022-10-06 DIAGNOSIS — Z7982 Long term (current) use of aspirin: Secondary | ICD-10-CM | POA: Diagnosis not present

## 2022-10-06 DIAGNOSIS — Z992 Dependence on renal dialysis: Secondary | ICD-10-CM | POA: Insufficient documentation

## 2022-10-06 DIAGNOSIS — Z7901 Long term (current) use of anticoagulants: Secondary | ICD-10-CM | POA: Insufficient documentation

## 2022-10-06 DIAGNOSIS — E039 Hypothyroidism, unspecified: Secondary | ICD-10-CM | POA: Insufficient documentation

## 2022-10-06 DIAGNOSIS — N186 End stage renal disease: Secondary | ICD-10-CM | POA: Insufficient documentation

## 2022-10-06 DIAGNOSIS — I120 Hypertensive chronic kidney disease with stage 5 chronic kidney disease or end stage renal disease: Secondary | ICD-10-CM | POA: Insufficient documentation

## 2022-10-06 DIAGNOSIS — E1122 Type 2 diabetes mellitus with diabetic chronic kidney disease: Secondary | ICD-10-CM | POA: Insufficient documentation

## 2022-10-06 DIAGNOSIS — Z4800 Encounter for change or removal of nonsurgical wound dressing: Secondary | ICD-10-CM | POA: Insufficient documentation

## 2022-10-06 LAB — AEROBIC/ANAEROBIC CULTURE W GRAM STAIN (SURGICAL/DEEP WOUND)

## 2022-10-06 NOTE — ED Provider Notes (Signed)
EMERGENCY DEPARTMENT AT [facility] Provider Note  CSN: [id number] Arrival date & time: 10/06/22 0542  Chief Complaint(s) Wound Check  HPI Paul [name] is a 32 y.o. 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  Patient states that this morning he woke up with alarms from his wound VAC indicating a blockage.  On arrival, patient's wound VAC appears canister to be coated in dried blood clot.  He denies chest pain, shortness of breath, abdominal pain, nausea, vomiting, fever or other systemic symptoms.   Past Medical History Past Medical History:  Diagnosis Date   Acute hypoxemic respiratory failure (HCC) 09/11/2016   Anemia    Chronic kidney disease    ARF on CRF Dialysis T/TH/Sa   Diabetes mellitus    Type II   End stage renal disease on dialysis (HCC)    [place]    GERD (gastroesophageal reflux disease)    diet controlled   HLD (hyperlipidemia)    Hypertension    Hypothyroidism    Morbid obesity (HCC)    Sacral wound    resolved   Thyroid disease    Patient Active Problem List   Diagnosis Date Noted   Acute osteomyelitis of left foot (HCC) 09/30/2022   Sepsis (HCC) 09/29/2022   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   Thrombus 05/04/2022   Ischemia of left upper extremity 05/04/2022   Complication of vascular access for dialysis 04/19/2022   ESRD (end stage renal disease) (HCC) 04/19/2022   Failure of hemodialysis access (HCC) 04/18/2022   Left hand pain 09/22/2021   Diabetic foot infection (HCC)    Wound infection 05/07/2021   MSSA bacteremia 05/07/2021   Osteomyelitis (HCC) 05/07/2021    Abscess 05/07/2021   Sepsis due to cellulitis (HCC) 05/06/2021   Poor compliance with medication 03/17/2021   Fluid overload, unspecified 07/09/2020   Complication of vascular dialysis catheter 03/23/2020   Other disorders of phosphorus metabolism 01/28/2020   Hypertension 01/08/2020   Hypocalcemia 10/29/2019   ESRD on dialysis (HCC) 11/16/2017   Pruritus, unspecified 04/04/2017   Unspecified protein-calorie malnutrition (HCC) 11/11/2016   Headache, unspecified 10/29/2016   Encounter for immunization 10/19/2016   Iron deficiency anemia, unspecified 10/10/2016   Anemia in chronic kidney disease 09/30/2016   Coagulation defect, unspecified (HCC) 09/30/2016   Pneumonia due to Pseudomonas (HCC) 09/30/2016   Secondary hyperparathyroidism of renal origin (HCC) 09/30/2016   Hypothyroidism 10/29/2009   Type 2 diabetes mellitus (HCC) 04/14/2009   Hyperlipidemia 04/14/2009   OBESITY, MORBID 04/14/2009   Essential hypertension, benign 04/14/2009   Home Medication(s) Prior to Admission medications   Medication Sig Start Date End Date Taking? Authorizing Provider  ciprofloxacin (CIPRO) 500 MG tablet Take 1 tablet (500 mg total) by mouth 2 (two) times daily. 10/03/22   Nadara Mustard, MD  Accu-Chek Softclix Lancets lancets Use as instructed Patient not taking: Reported on 09/30/2022 06/11/21   Alicia Amel, MD  apixaban Everlene Balls) 5 MG TABS tablet Take 1 tablet by mouth twice daily 08/04/22   Dameron, Nolberto Hanlon, DO  ascorbic acid (VITAMIN C) 1000 MG tablet Take 1 tablet (1,000 mg total) by mouth daily. 10/03/22   Dameron, Nolberto Hanlon, DO  aspirin EC 81 MG  tablet Take 1 tablet (81 mg total) by mouth daily. Swallow whole. 05/11/22   Dameron, Nolberto Hanlon, DO  Blood Glucose Monitoring Suppl (ACCU-CHEK GUIDE ME) w/Device KIT 1 kit by Does not apply route daily. Patient not taking: Reported on 09/30/2022 05/22/21   Alicia Amel, MD  calcium carbonate (TUMS - DOSED IN MG ELEMENTAL CALCIUM) 500 MG chewable  tablet Chew 1,000 mg by mouth daily as needed for indigestion or heartburn.    [provider]  cetirizine (ZYRTEC) 10 MG tablet Take 1 tablet (10 mg total) by mouth daily as needed for allergies. 05/11/22   Dameron, Nolberto Hanlon, DO  cinacalcet (SENSIPAR) 30 MG tablet Take 30 mg by mouth daily. 08/12/21   [provider]  doxycycline (VIBRA-TABS) 100 MG tablet Take 1 tablet (100 mg total) by mouth 2 (two) times daily. 10/01/22   Nadara Mustard, MD  doxycycline (VIBRA-TABS) 100 MG tablet Take 1 tablet (100 mg total) by mouth every 12 (twelve) hours. 10/03/22   Dameron, Nolberto Hanlon, DO  ferric citrate (AURYXIA) 1 GM 210 MG(Fe) tablet Take 2-4 tablets (420-840 mg total) by mouth See admin instructions. Take 4 tablets (840 mg) by mouth with each meal & take 2 tablets (420 mg) by mouth with each snack. 05/11/22   Dameron, Nolberto Hanlon, DO  gabapentin (NEURONTIN) 100 MG capsule TAKE 1 CAPSULE BY MOUTH THREE TIMES DAILY 05/09/22   Maeola Harman, MD  gabapentin (NEURONTIN) 300 MG capsule Take 1 capsule by mouth at bedtime 07/25/22   Dameron, Nolberto Hanlon, DO  glucose blood (ACCU-CHEK GUIDE) test strip 1 each by Other route 2 (two) times daily. Use as instructed Patient not taking: Reported on 09/30/2022 09/22/21   Shelby Mattocks, DO  HYDROcodone-acetaminophen (NORCO/VICODIN) 5-325 MG tablet Take 1 tablet by mouth every 8 (eight) hours as needed for moderate pain. 09/23/22   Adonis Huguenin, NP  LANTUS 100 UNIT/ML injection Inject 0.05 mLs (5 Units total) into the skin 2 (two) times daily. 05/11/22   Dameron, Nolberto Hanlon, DO  levothyroxine (SYNTHROID) 150 MCG tablet TAKE 1 TABLET BY MOUTH ONCE DAILY BEFORE BREAKFAST *APPOINTMENT NEEDED FOR FURTHER REFILLS* Patient taking differently: Take 150 mcg by mouth daily before breakfast. 09/26/22   Dameron, Nolberto Hanlon, DO  midodrine (PROAMATINE) 5 MG tablet Take 1 tablet (5 mg total) by mouth Every Tuesday,Thursday,and Saturday with dialysis. 05/12/22   Dameron, Nolberto Hanlon, DO   multivitamin (RENA-VIT) TABS tablet Take 1 tablet by mouth at bedtime. 05/11/22   Dameron, Nolberto Hanlon, DO  oxyCODONE (OXY IR/ROXICODONE) 5 MG immediate release tablet Take 1 tablet (5 mg total) by mouth every 6 (six) hours as needed for moderate pain (pain score 4-6). 10/02/22   Dameron, Nolberto Hanlon, DO  oxyCODONE-acetaminophen (PERCOCET/ROXICET) 5-325 MG tablet Take 1 tablet by mouth every 4 (four) hours as needed for severe pain. 10/04/22   Nadara Mustard, MD  polyethylene glycol (MIRALAX / GLYCOLAX) 17 g packet Take 17 g by mouth daily as needed for mild constipation. 10/02/22   Dameron, Nolberto Hanlon, DO  rosuvastatin (CRESTOR) 20 MG tablet Take 1 tablet by mouth once daily 08/23/22   Dameron, Nolberto Hanlon, DO  Syringe, Disposable, (B-D SYRINGE SLIP TIP 30CC) 30 ML MISC 1 Syringe by Does not apply route daily. Patient not taking: Reported on 09/30/2022 01/31/19   Melene Plan, MD  zinc sulfate 220 (50 Zn) MG capsule Take 1 capsule (220 mg total) by mouth daily. 10/03/22   Darral Dash, DO  Past Surgical History Past Surgical History:  Procedure Laterality Date   AMPUTATION Left 05/08/2021   Procedure: AMPUTATION GREAT TOE;  Surgeon: Nadara Mustard, MD;  Location: [facility] OR;  Service: Orthopedics;  Laterality: Left;   AMPUTATION Left 10/01/2022   Procedure: LEFT FOOT AMPUTATION RAY 1st;  Surgeon: Nadara Mustard, MD;  Location: [facility] OR;  Service: Orthopedics;  Laterality: Left;   AV FISTULA PLACEMENT Left 09/26/2016   Procedure: LEFT UPPER ARM ARTERIOVENOUS (AV) FISTULA CREATION;  Surgeon: Sherren Kerns, MD;  Location: [facility] OR;  Service: Vascular;  Laterality: Left;   AV FISTULA PLACEMENT Left 12/02/2016   Procedure: INSERTION OF ARTERIOVENOUS GORE-TEX GRAFT LEFT UPPER  ARM USING A 4-7MM BY 45CM GRAFT ;  Surgeon: Larina Earthly, MD;  Location: [facility] OR;  Service: Vascular;  Laterality: Left;   AV  FISTULA PLACEMENT Right 07/03/2020   Procedure: INSERTION OF ARTERIOVENOUS (AV) GORE-TEX GRAFT RIGHT UPPER ARM;  Surgeon: Cephus Shelling, MD;  Location: [facility] OR;  Service: Vascular;  Laterality: Right;   AV FISTULA PLACEMENT Left 07/02/2021   Procedure: LEFT UPPER EXTREMITY ARTERIOVENOUS FISTULA CREATION;  Surgeon: Maeola Harman, MD;  Location: [facility] OR;  Service: Vascular;  Laterality: Left;   BASCILIC VEIN TRANSPOSITION Right 05/06/2020   Procedure: FIRST STAGE RIGHT BASCILIC VEIN TRANSPOSITION;  Surgeon: Nada Libman, MD;  Location: [facility] OR;  Service: Vascular;  Laterality: Right;   BASCILIC VEIN TRANSPOSITION Left 08/20/2021   Procedure: LEFT ARM SECOND STAGE WITH ARTERIOVENOUS GRAFT;  Surgeon: Maeola Harman, MD;  Location: [facility] OR;  Service: Vascular;  Laterality: Left;   EXCHANGE OF A DIALYSIS CATHETER Right 10/08/2016   Procedure: EXCHANGE OF A DIALYSIS CATHETER-RIGHT INTERNAL JUGULAR;  Surgeon: Fransisco Hertz, MD;  Location: [facility] OR;  Service: Vascular;  Laterality: Right;   INSERTION OF DIALYSIS CATHETER Right 09/26/2016   Procedure: INSERTION OF DIALYSIS CATHETER - Right Internal Jugular Placement;  Surgeon: Sherren Kerns, MD;  Location: [facility] OR;  Service: Vascular;  Laterality: Right;   INSERTION OF DIALYSIS CATHETER Left 03/22/2018   Procedure: INSERTION OF DIALYSIS CATHETER;  Surgeon: Cephus Shelling, MD;  Location: [facility] OR;  Service: Vascular;  Laterality: Left;   INTRAOPERATIVE ARTERIOGRAM Left 05/04/2022   Procedure: INTRA OPERATIVE ARTERIOGRAM;  Surgeon: Victorino Sparrow, MD;  Location: [facility] OR;  Service: Vascular;  Laterality: Left;   IR AV DIALY SHUNT INTRO NEEDLE/INTRACATH INITIAL W/PTA/IMG LEFT  08/19/2022   IR DIALY SHUNT INTRO NEEDLE/INTRACATH INITIAL W/IMG LEFT Left 04/15/2022   IR FLUORO GUIDE CV LINE LEFT  04/19/2022   IR FLUORO GUIDE CV LINE LEFT  05/30/2022   IR FLUORO GUIDE CV LINE RIGHT  03/26/2021   IR FLUORO GUIDE CV LINE RIGHT  05/13/2021   IR  FLUORO GUIDE CV LINE RIGHT  05/04/2022   IR REMOVAL TUN CV CATH W/O FL  05/11/2021   IR REMOVAL TUN CV CATH W/O FL  10/06/2021   IR THROMBECTOMY AV FISTULA W/THROMBOLYSIS/PTA INC/SHUNT/IMG LEFT Left 02/02/2022   IR THROMBECTOMY AV FISTULA W/THROMBOLYSIS/PTA INC/SHUNT/IMG LEFT Left 03/22/2022   IR THROMBECTOMY AV FISTULA W/THROMBOLYSIS/PTA INC/SHUNT/IMG LEFT Left 05/04/2022   IR THROMBECTOMY AV FISTULA W/THROMBOLYSIS/PTA/STENT INC/SHUNT/IMG LT Left 04/27/2022   IR US GUIDE VASC ACCESS LEFT  02/02/2022   IR US GUIDE VASC ACCESS LEFT  03/22/2022   IR US GUIDE VASC ACCESS LEFT  04/15/2022   IR US GUIDE VASC ACCESS LEFT  04/19/2022   IR US GUIDE VASC ACCESS LEFT  04/27/2022   IR US  GUIDE VASC ACCESS RIGHT  03/26/2021   IR US GUIDE VASC ACCESS RIGHT  05/13/2021   IR US GUIDE VASC ACCESS RIGHT  05/04/2022   REVISON OF ARTERIOVENOUS FISTULA Left 03/22/2018   Procedure: LEFT UPPER EXTREMITY ARTERIOVENOUS  REVISION WITH GORE-TEX GRAFT.;  Surgeon: Cephus Shelling, MD;  Location: [facility] OR;  Service: Vascular;  Laterality: Left;   TEE WITHOUT CARDIOVERSION N/A 05/12/2021   Procedure: TRANSESOPHAGEAL ECHOCARDIOGRAM (TEE);  Surgeon: Christell Constant, MD;  Location: [facility] ENDOSCOPY;  Service: Cardiovascular;  Laterality: N/A;   THROMBECTOMY Left 08/26/2019   thrombectomy of LUA loop AVG   THROMBECTOMY AND REVISION OF ARTERIOVENTOUS (AV) GORETEX  GRAFT Right 07/27/2020   Procedure: INSERTION OF RIGHT ARM LOOP GRAFT WITH EXCISION OF RIGHT ARM BRACHIAL AXILLARY GRAFT;  Surgeon: Cephus Shelling, MD;  Location: [facility] OR;  Service: Vascular;  Laterality: Right;   THROMBECTOMY BRACHIAL ARTERY Left 05/04/2022   Procedure: LEFT ARM RADIAL BRACHIAL ARTERY EMBOLECTOMY;  Surgeon: Victorino Sparrow, MD;  Location: [facility] OR;  Service: Vascular;  Laterality: Left;   UPPER EXTREMITY VENOGRAPHY Bilateral 06/14/2021   Procedure: UPPER EXTREMITY VENOGRAPHY;  Surgeon: Maeola Harman, MD;  Location: [facility] INVASIVE CV LAB;   Service: Cardiovascular;  Laterality: Bilateral;   VISCERAL ANGIOGRAPHY Right 06/17/2020   Procedure: CENTRAL VENO;  Surgeon: Leonie Douglas, MD;  Location: [facility] INVASIVE CV LAB;  Service: Cardiovascular;  Laterality: Right;   Family History Family History  Problem Relation Age of Onset   Heart disease Mother    Hypertension Mother     Social History Social History   Tobacco Use   Smoking status: Never   Smokeless tobacco: Never  Vaping Use   Vaping Use: Never used  Substance Use Topics   Alcohol use: No   Drug use: Yes    Frequency: 2.0 times per week    Types: Marijuana    Comment: Last use was on 08/17/21   Allergies Hydralazine hcl  Review of Systems Review of Systems  All other systems reviewed and are negative.   Physical Exam Vital Signs  I have reviewed the triage vital signs BP 115/77 (BP Location: Right Wrist)   Pulse 87   Temp 98 F (36.7 C)   Resp 17   Ht 6\' 2"  (1.88 m)   Wt (!) 165.7 kg   SpO2 100%   BMI 46.90 kg/m   Physical Exam Constitutional:      General: He is not in acute distress.    Appearance: Normal appearance.  HENT:     Head: Normocephalic and atraumatic.     Nose: No congestion or rhinorrhea.  Eyes:     General:        Right eye: No discharge.        Left eye: No discharge.     Extraocular Movements: Extraocular movements intact.     Pupils: Pupils are equal, round, and reactive to light.  Cardiovascular:     Rate and Rhythm: Normal rate and regular rhythm.     Heart sounds: No murmur heard. Pulmonary:     Effort: No respiratory distress.     Breath sounds: No wheezing or rales.  Abdominal:     General: There is no distension.     Tenderness: There is no abdominal tenderness.  Musculoskeletal:        General: Normal range of motion.     Cervical back: Normal range of motion.  Skin:    General: Skin is warm and dry.  Findings: Lesion present.  Neurological:     General: No focal deficit present.     Mental  Status: He is alert.     ED Results and Treatments Labs (all labs ordered are listed, but only abnormal results are displayed) Labs Reviewed - No data to display                                                                                                                        Radiology No results found.  Pertinent labs & imaging results that were available during my care of the patient were reviewed by me and considered in my medical decision making (see MDM for details).  Medications Ordered in ED Medications - No data to display                                                                                                                                   Procedures Procedures  (including critical care time)  Medical Decision Making / ED Course   This patient presents to the ED for concern of wound VAC malfunction, this involves an extensive number of treatment options, and is a complaint that carries with it a high risk of complications and morbidity.  The differential diagnosis includes wound VAC malfunction, clogged in wound VAC tubing, appropriately healing tissue  MDM: Patient seen emergency room for evaluation of a wound VAC malfunction.  Physical exam reveals an appropriately dressed left foot with patient's wound VAC caked in blood clot.  Orders were placed to replace the wound VAC and at time of signout patient pending these supplies coming from the operating room.  Patient safe for discharge after.   Additional history obtained: -Additional history obtained from mother -External records from outside source obtained and reviewed including: Chart review including previous notes, labs, imaging, consultation notes      Medicines ordered and prescription drug management: No orders of the defined types were placed in this encounter.   -I have reviewed the patients home medicines and have made adjustments as needed  Critical interventions none   Cardiac  Monitoring: The patient was maintained on a cardiac monitor.  I personally viewed and interpreted the cardiac monitored which showed an underlying rhythm of: NSR  Social Determinants of Health:  Factors impacting patients care include: none   Reevaluation: After the interventions noted above, I reevaluated the patient and found that they have :stayed the  same  Co morbidities that complicate the patient evaluation  Past Medical History:  Diagnosis Date   Acute hypoxemic respiratory failure (HCC) 09/11/2016   Anemia    Chronic kidney disease    ARF on CRF Dialysis T/TH/Sa   Diabetes mellitus    Type II   End stage renal disease on dialysis (HCC)    [place]    GERD (gastroesophageal reflux disease)    diet controlled   HLD (hyperlipidemia)    Hypertension    Hypothyroidism    Morbid obesity (HCC)    Sacral wound    resolved   Thyroid disease       Dispostion: I considered admission for this patient, but patient does not meet inpatient criteria for admission and is safe for discharge once he receives appropriate wound care supplies     Final Clinical Impression(s) / ED Diagnoses Final diagnoses:  None     @PCDICTATION @    Glendora Score, MD 10/06/22 1650

## 2022-10-06 NOTE — ED Provider Notes (Signed)
Patient signed out to me at 0700 by Dr. Posey Rea pending wound VAC.  In short this is a 32 year old male who presented to the emergency department with an error message to his wound VAC for his foot.  We plan to go to the OR to obtain a new wound VAC device to replace his.  Is able to replace the wound VAC component itself and is now functioning appropriately.  He is stable for discharge with outpatient follow-up as planned.   Elayne Snare K, DO 10/06/22 [phone]

## 2022-10-06 NOTE — ED Notes (Signed)
Patient Alert and oriented to baseline. Stable and ambulatory to baseline. Patient verbalized understanding of the discharge instructions.  Patient belongings were taken by the patient.   

## 2022-10-06 NOTE — Discharge Instructions (Signed)
You were seen in the emergency department to have your wound VAC replaced.  We were able to replace it for you and it is now functioning appropriately.  You can follow-up with your orthopedic doctor as needed for further wound checks.  You should return to the emergency department if you are having fevers, your wound VAC falls off, you have significant bleeding from your wound VAC or if you have any other new or concerning symptoms.

## 2022-10-06 NOTE — ED Triage Notes (Signed)
Patient reports his wound vac for his foot stopped working this morning after giving an "error" message.  Patient reports he unplugged it and brought it in.

## 2022-10-12 ENCOUNTER — Ambulatory Visit (INDEPENDENT_AMBULATORY_CARE_PROVIDER_SITE_OTHER): Payer: Medicaid Other | Admitting: Family

## 2022-10-12 ENCOUNTER — Telehealth: Payer: Self-pay | Admitting: Family

## 2022-10-12 ENCOUNTER — Encounter: Payer: Self-pay | Admitting: Family

## 2022-10-12 DIAGNOSIS — Z89432 Acquired absence of left foot: Secondary | ICD-10-CM

## 2022-10-12 MED ORDER — OXYCODONE HCL 5 MG PO TABS: 5.0000 mg | ORAL_TABLET | Freq: Four times a day (QID) | ORAL | 0 refills | Status: DC | PRN

## 2022-10-12 NOTE — Progress Notes (Signed)
Post-Op Visit Note   Patient: Paul [name]           Date of Birth: January 13, 1991           MRN: [id number] Visit Date: 10/12/2022 PCP: Darral Dash, DO  Chief Complaint:  Chief Complaint  Patient presents with   Left Foot - Routine Post Op    10/01/2022 left foot 1st ray amputation     HPI:  HPI The patient is a 32 year old gentleman who is seen status post left first ray amputation May 11 wound VAC removed today Ortho Exam On examination of the left foot incision approximated with sutures there is gaping to 3 mm there is no dehiscence no erythema no purulence  Visit Diagnoses: No diagnosis found.  Plan: Begin daily dose of cleansing dry dressings follow-up in 2 weeks nonweightbearing  Follow-Up Instructions: No follow-ups on file.   Imaging: No results found.  Orders:  No orders of the defined types were placed in this encounter.  No orders of the defined types were placed in this encounter.    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   Thrombus 05/04/2022   Ischemia of left upper extremity 05/04/2022   Complication of vascular access for dialysis 04/19/2022   ESRD (end stage renal disease) (HCC) 04/19/2022   Failure of hemodialysis access (HCC) 04/18/2022   Left hand pain 09/22/2021   Diabetic foot infection (HCC)    Wound infection 05/07/2021   MSSA bacteremia 05/07/2021   Osteomyelitis (HCC) 05/07/2021   Abscess 05/07/2021   Sepsis due to cellulitis (HCC) 05/06/2021   Poor compliance with medication 03/17/2021   Fluid overload, unspecified 07/09/2020   Complication of vascular dialysis catheter 03/23/2020   Other disorders of phosphorus metabolism 01/28/2020   Hypertension 01/08/2020   Hypocalcemia 10/29/2019   ESRD on dialysis (HCC) 11/16/2017   Pruritus, unspecified 04/04/2017   Unspecified  protein-calorie malnutrition (HCC) 11/11/2016   Headache, unspecified 10/29/2016   Encounter for immunization 10/19/2016   Iron deficiency anemia, unspecified 10/10/2016   Anemia in chronic kidney disease 09/30/2016   Coagulation defect, unspecified (HCC) 09/30/2016   Pneumonia due to Pseudomonas (HCC) 09/30/2016   Secondary hyperparathyroidism of renal origin (HCC) 09/30/2016   Hypothyroidism 10/29/2009   Type 2 diabetes mellitus (HCC) 04/14/2009   Hyperlipidemia 04/14/2009   OBESITY, MORBID 04/14/2009   Essential hypertension, benign 04/14/2009   Past Medical History:  Diagnosis Date   Acute hypoxemic respiratory failure (HCC) 09/11/2016   Anemia    Chronic kidney disease    ARF on CRF Dialysis T/TH/Sa   Diabetes mellitus    Type II   End stage renal disease on dialysis (HCC)    [place]    GERD (gastroesophageal reflux disease)    diet controlled   HLD (hyperlipidemia)    Hypertension    Hypothyroidism    Morbid obesity (HCC)    Sacral wound    resolved   Thyroid disease     Family History  Problem Relation Age of Onset   Heart disease Mother    Hypertension Mother     Past Surgical History:  Procedure Laterality Date   AMPUTATION Left 05/08/2021   Procedure: AMPUTATION GREAT TOE;  Surgeon: Nadara Mustard, MD;  Location: [facility] OR;  Service: Orthopedics;  Laterality: Left;   AMPUTATION Left 10/01/2022   Procedure: LEFT FOOT  AMPUTATION RAY 1st;  Surgeon: Nadara Mustard, MD;  Location: [facility] OR;  Service: Orthopedics;  Laterality: Left;   AV FISTULA PLACEMENT Left 09/26/2016   Procedure: LEFT UPPER ARM ARTERIOVENOUS (AV) FISTULA CREATION;  Surgeon: Sherren Kerns, MD;  Location: [facility] OR;  Service: Vascular;  Laterality: Left;   AV FISTULA PLACEMENT Left 12/02/2016   Procedure: INSERTION OF ARTERIOVENOUS GORE-TEX GRAFT LEFT UPPER  ARM USING A 4-7MM BY 45CM GRAFT ;  Surgeon: Larina Earthly, MD;  Location: [facility] OR;  Service: Vascular;  Laterality: Left;   AV FISTULA PLACEMENT  Right 07/03/2020   Procedure: INSERTION OF ARTERIOVENOUS (AV) GORE-TEX GRAFT RIGHT UPPER ARM;  Surgeon: Cephus Shelling, MD;  Location: [facility] OR;  Service: Vascular;  Laterality: Right;   AV FISTULA PLACEMENT Left 07/02/2021   Procedure: LEFT UPPER EXTREMITY ARTERIOVENOUS FISTULA CREATION;  Surgeon: Maeola Harman, MD;  Location: [facility] OR;  Service: Vascular;  Laterality: Left;   BASCILIC VEIN TRANSPOSITION Right 05/06/2020   Procedure: FIRST STAGE RIGHT BASCILIC VEIN TRANSPOSITION;  Surgeon: Nada Libman, MD;  [facility]: [facility] OR;  Service: Vascular;  Laterality: Right;   BASCILIC VEIN TRANSPOSITION Left 08/20/2021   Procedure: LEFT ARM SECOND STAGE WITH ARTERIOVENOUS GRAFT;  Surgeon: Maeola Harman, MD;  Location: [facility] OR;  Service: Vascular;  Laterality: Left;   EXCHANGE OF A DIALYSIS CATHETER Right 10/08/2016   Procedure: EXCHANGE OF A DIALYSIS CATHETER-RIGHT INTERNAL JUGULAR;  Surgeon: Fransisco Hertz, MD;  [facility]: [facility] OR;  Service: Vascular;  Laterality: Right;   INSERTION OF DIALYSIS CATHETER Right 09/26/2016   Procedure: INSERTION OF DIALYSIS CATHETER - Right Internal Jugular Placement;  Surgeon: Sherren Kerns, MD;  [facility]: [facility] OR;  Service: Vascular;  Laterality: Right;   INSERTION OF DIALYSIS CATHETER Left 03/22/2018   Procedure: INSERTION OF DIALYSIS CATHETER;  Surgeon: Cephus Shelling, MD;  Location: [facility] OR;  Service: Vascular;  Laterality: Left;   INTRAOPERATIVE ARTERIOGRAM Left 05/04/2022   Procedure: INTRA OPERATIVE ARTERIOGRAM;  Surgeon: Victorino Sparrow, MD;  Location: [facility] OR;  Service: Vascular;  Laterality: Left;   IR AV DIALY SHUNT INTRO NEEDLE/INTRACATH INITIAL W/PTA/IMG LEFT  08/19/2022   IR DIALY SHUNT INTRO NEEDLE/INTRACATH INITIAL W/IMG LEFT Left 04/15/2022   IR FLUORO GUIDE CV LINE LEFT  04/19/2022   IR FLUORO GUIDE CV LINE LEFT  05/30/2022   IR FLUORO GUIDE CV LINE RIGHT  03/26/2021   IR FLUORO GUIDE CV LINE RIGHT  05/13/2021   IR FLUORO GUIDE CV LINE  RIGHT  05/04/2022   IR REMOVAL TUN CV CATH W/O FL  05/11/2021   IR REMOVAL TUN CV CATH W/O FL  10/06/2021   IR THROMBECTOMY AV FISTULA W/THROMBOLYSIS/PTA INC/SHUNT/IMG LEFT Left 02/02/2022   IR THROMBECTOMY AV FISTULA W/THROMBOLYSIS/PTA INC/SHUNT/IMG LEFT Left 03/22/2022   IR THROMBECTOMY AV FISTULA W/THROMBOLYSIS/PTA INC/SHUNT/IMG LEFT Left 05/04/2022   IR THROMBECTOMY AV FISTULA W/THROMBOLYSIS/PTA/STENT INC/SHUNT/IMG LT Left 04/27/2022   IR US GUIDE VASC ACCESS LEFT  02/02/2022   IR US GUIDE VASC ACCESS LEFT  03/22/2022   IR US GUIDE VASC ACCESS LEFT  04/15/2022   IR US GUIDE VASC ACCESS LEFT  04/19/2022   IR US GUIDE VASC ACCESS LEFT  04/27/2022   IR US GUIDE VASC ACCESS RIGHT  03/26/2021   IR US GUIDE VASC ACCESS RIGHT  05/13/2021   IR US GUIDE VASC ACCESS RIGHT  05/04/2022   REVISON OF ARTERIOVENOUS FISTULA Left 03/22/2018   Procedure: LEFT UPPER EXTREMITY ARTERIOVENOUS  REVISION WITH GORE-TEX GRAFT.;  Surgeon: Cephus Shelling, MD;  Location: [facility] OR;  Service: Vascular;  Laterality: Left;   TEE WITHOUT CARDIOVERSION N/A 05/12/2021   Procedure: TRANSESOPHAGEAL ECHOCARDIOGRAM (TEE);  Surgeon: Christell Constant, MD;  Location: [facility] ENDOSCOPY;  Service: Cardiovascular;  Laterality: N/A;   THROMBECTOMY Left 08/26/2019   thrombectomy of LUA loop AVG   THROMBECTOMY AND REVISION OF ARTERIOVENTOUS (AV) GORETEX  GRAFT Right 07/27/2020   Procedure: INSERTION OF RIGHT ARM LOOP GRAFT WITH EXCISION OF RIGHT ARM BRACHIAL AXILLARY GRAFT;  Surgeon: Cephus Shelling, MD;  Location: [facility] OR;  Service: Vascular;  Laterality: Right;   THROMBECTOMY BRACHIAL ARTERY Left 05/04/2022   Procedure: LEFT ARM RADIAL BRACHIAL ARTERY EMBOLECTOMY;  Surgeon: Victorino Sparrow, MD;  Location: [facility] OR;  Service: Vascular;  Laterality: Left;   UPPER EXTREMITY VENOGRAPHY Bilateral 06/14/2021   Procedure: UPPER EXTREMITY VENOGRAPHY;  Surgeon: Maeola Harman, MD;  Location: [facility] INVASIVE CV LAB;  Service:  Cardiovascular;  Laterality: Bilateral;   VISCERAL ANGIOGRAPHY Right 06/17/2020   Procedure: CENTRAL VENO;  Surgeon: Leonie Douglas, MD;  Location: [facility] INVASIVE CV LAB;  Service: Cardiovascular;  Laterality: Right;   Social History   Occupational History   Not on file  Tobacco Use   Smoking status: Never   Smokeless tobacco: Never  Vaping Use   Vaping Use: Never used  Substance and Sexual Activity   Alcohol use: No   Drug use: Yes    Frequency: 2.0 times per week    Types: Marijuana    Comment: Last use was on 08/17/21   Sexual activity: Yes

## 2022-10-12 NOTE — Telephone Encounter (Signed)
Patient called needing Rx refilled Oxycodone. The number to contact patient is [phone] ?

## 2022-10-21 ENCOUNTER — Telehealth: Payer: Self-pay | Admitting: Orthopedic Surgery

## 2022-10-21 ENCOUNTER — Other Ambulatory Visit: Payer: Self-pay | Admitting: Orthopedic Surgery

## 2022-10-21 ENCOUNTER — Other Ambulatory Visit: Payer: Self-pay | Admitting: Student

## 2022-10-21 DIAGNOSIS — Z992 Dependence on renal dialysis: Secondary | ICD-10-CM | POA: Diagnosis not present

## 2022-10-21 DIAGNOSIS — E1129 Type 2 diabetes mellitus with other diabetic kidney complication: Secondary | ICD-10-CM | POA: Diagnosis not present

## 2022-10-21 DIAGNOSIS — N186 End stage renal disease: Secondary | ICD-10-CM | POA: Diagnosis not present

## 2022-10-21 MED ORDER — OXYCODONE HCL 5 MG PO TABS: 5.0000 mg | ORAL_TABLET | Freq: Four times a day (QID) | ORAL | 0 refills | Status: DC | PRN

## 2022-10-21 NOTE — Telephone Encounter (Signed)
Patient asking for his Oxycodone medication refill. Please advise

## 2022-10-26 ENCOUNTER — Encounter: Payer: Self-pay | Admitting: Family

## 2022-10-26 ENCOUNTER — Ambulatory Visit (INDEPENDENT_AMBULATORY_CARE_PROVIDER_SITE_OTHER): Payer: Medicaid Other | Admitting: Family

## 2022-10-26 ENCOUNTER — Other Ambulatory Visit: Payer: Self-pay | Admitting: Student

## 2022-10-26 DIAGNOSIS — Z89432 Acquired absence of left foot: Secondary | ICD-10-CM

## 2022-10-26 DIAGNOSIS — E1122 Type 2 diabetes mellitus with diabetic chronic kidney disease: Secondary | ICD-10-CM

## 2022-10-26 NOTE — Progress Notes (Signed)
Post-Op Visit Note   Patient: Paul [name]           Date of Birth: 1991/03/29           MRN: [id number] Visit Date: 10/26/2022 PCP: Darral Dash, DO  Chief Complaint:  Chief Complaint  Patient presents with   Left Foot - Routine Post Op    HPI:  HPI The patient is a 32 year old gentleman who is seen status post first ray amputation on the left sutures are in place.  He is in a wheelchair today Ortho Exam On examination the left lower extremity unfortunately the medial foot incision has completely dehisced this is open nearly 1 cm in width 8 mm in depth there is no exposed bone do not probe to bone there is scant fibrinous exudative tissue there is granulation and majority of the wound bed no surrounding erythema warmth or purulence  Visit Diagnoses: No diagnosis found.  Plan: Daily dose of cleansing.  Sutures harvested pack open with gauze.  Nonweightbearing.  Discussing portance of nonweightbearing and elevating  Follow-Up Instructions: No follow-ups on file.   Imaging: No results found.  Orders:  No orders of the defined types were placed in this encounter.  No orders of the defined types were placed in this encounter.    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   Thrombus 05/04/2022   Ischemia of left upper extremity 05/04/2022   Complication of vascular access for dialysis 04/19/2022   ESRD (end stage renal disease) (HCC) 04/19/2022   Failure of hemodialysis access (HCC) 04/18/2022   Left hand pain 09/22/2021   Diabetic foot infection (HCC)    Wound infection 05/07/2021   MSSA bacteremia 05/07/2021   Osteomyelitis (HCC) 05/07/2021   Abscess 05/07/2021   Sepsis due to cellulitis (HCC) 05/06/2021   Poor compliance with medication 03/17/2021   Fluid overload, unspecified 07/09/2020   Complication of  vascular dialysis catheter 03/23/2020   Other disorders of phosphorus metabolism 01/28/2020   Hypertension 01/08/2020   Hypocalcemia 10/29/2019   ESRD on dialysis (HCC) 11/16/2017   Pruritus, unspecified 04/04/2017   Unspecified protein-calorie malnutrition (HCC) 11/11/2016   Headache, unspecified 10/29/2016   Encounter for immunization 10/19/2016   Iron deficiency anemia, unspecified 10/10/2016   Anemia in chronic kidney disease 09/30/2016   Coagulation defect, unspecified (HCC) 09/30/2016   Pneumonia due to Pseudomonas (HCC) 09/30/2016   Secondary hyperparathyroidism of renal origin (HCC) 09/30/2016   Hypothyroidism 10/29/2009   Type 2 diabetes mellitus (HCC) 04/14/2009   Hyperlipidemia 04/14/2009   OBESITY, MORBID 04/14/2009   Essential hypertension, benign 04/14/2009   Past Medical History:  Diagnosis Date   Acute hypoxemic respiratory failure (HCC) 09/11/2016   Anemia    Chronic kidney disease    ARF on CRF Dialysis T/TH/Sa   Diabetes mellitus    Type II   End stage renal disease on dialysis (HCC)    [place]    GERD (gastroesophageal reflux disease)    diet controlled   HLD (hyperlipidemia)    Hypertension    Hypothyroidism    Morbid obesity (HCC)    Sacral wound    resolved   Thyroid disease     Family History  Problem Relation Age of Onset   Heart disease Mother    Hypertension Mother     Past Surgical History:  Procedure Laterality  Date   AMPUTATION Left 05/08/2021   Procedure: AMPUTATION GREAT TOE;  Surgeon: Nadara Mustard, MD;  Location: [facility] OR;  Service: Orthopedics;  Laterality: Left;   AMPUTATION Left 10/01/2022   Procedure: LEFT FOOT AMPUTATION RAY 1st;  Surgeon: Nadara Mustard, MD;  Location: [facility] OR;  Service: Orthopedics;  Laterality: Left;   AV FISTULA PLACEMENT Left 09/26/2016   Procedure: LEFT UPPER ARM ARTERIOVENOUS (AV) FISTULA CREATION;  Surgeon: Sherren Kerns, MD;  Location: [facility] OR;  Service: Vascular;  Laterality: Left;   AV FISTULA  PLACEMENT Left 12/02/2016   Procedure: INSERTION OF ARTERIOVENOUS GORE-TEX GRAFT LEFT UPPER  ARM USING A 4-7MM BY 45CM GRAFT ;  Surgeon: Larina Earthly, MD;  Location: [facility] OR;  Service: Vascular;  Laterality: Left;   AV FISTULA PLACEMENT Right 07/03/2020   Procedure: INSERTION OF ARTERIOVENOUS (AV) GORE-TEX GRAFT RIGHT UPPER ARM;  Surgeon: Cephus Shelling, MD;  Location: [facility] OR;  Service: Vascular;  Laterality: Right;   AV FISTULA PLACEMENT Left 07/02/2021   Procedure: LEFT UPPER EXTREMITY ARTERIOVENOUS FISTULA CREATION;  Surgeon: Maeola Harman, MD;  Location: [facility] OR;  Service: Vascular;  Laterality: Left;   BASCILIC VEIN TRANSPOSITION Right 05/06/2020   Procedure: FIRST STAGE RIGHT BASCILIC VEIN TRANSPOSITION;  Surgeon: Nada Libman, MD;  Location: [facility] OR;  Service: Vascular;  Laterality: Right;   BASCILIC VEIN TRANSPOSITION Left 08/20/2021   Procedure: LEFT ARM SECOND STAGE WITH ARTERIOVENOUS GRAFT;  Surgeon: Maeola Harman, MD;  Location: [facility] OR;  Service: Vascular;  Laterality: Left;   EXCHANGE OF A DIALYSIS CATHETER Right 10/08/2016   Procedure: EXCHANGE OF A DIALYSIS CATHETER-RIGHT INTERNAL JUGULAR;  Surgeon: Fransisco Hertz, MD;  Location: [facility] OR;  Service: Vascular;  Laterality: Right;   INSERTION OF DIALYSIS CATHETER Right 09/26/2016   Procedure: INSERTION OF DIALYSIS CATHETER - Right Internal Jugular Placement;  Surgeon: Sherren Kerns, MD;  Location: [facility] OR;  Service: Vascular;  Laterality: Right;   INSERTION OF DIALYSIS CATHETER Left 03/22/2018   Procedure: INSERTION OF DIALYSIS CATHETER;  Surgeon: Cephus Shelling, MD;  Location: [facility] OR;  Service: Vascular;  Laterality: Left;   INTRAOPERATIVE ARTERIOGRAM Left 05/04/2022   Procedure: INTRA OPERATIVE ARTERIOGRAM;  Surgeon: Victorino Sparrow, MD;  Location: [facility] OR;  Service: Vascular;  Laterality: Left;   IR AV DIALY SHUNT INTRO NEEDLE/INTRACATH INITIAL W/PTA/IMG LEFT  08/19/2022   IR DIALY SHUNT INTRO NEEDLE/INTRACATH  INITIAL W/IMG LEFT Left 04/15/2022   IR FLUORO GUIDE CV LINE LEFT  04/19/2022   IR FLUORO GUIDE CV LINE LEFT  05/30/2022   IR FLUORO GUIDE CV LINE RIGHT  03/26/2021   IR FLUORO GUIDE CV LINE RIGHT  05/13/2021   IR FLUORO GUIDE CV LINE RIGHT  05/04/2022   IR REMOVAL TUN CV CATH W/O FL  05/11/2021   IR REMOVAL TUN CV CATH W/O FL  10/06/2021   IR THROMBECTOMY AV FISTULA W/THROMBOLYSIS/PTA INC/SHUNT/IMG LEFT Left 02/02/2022   IR THROMBECTOMY AV FISTULA W/THROMBOLYSIS/PTA INC/SHUNT/IMG LEFT Left 03/22/2022   IR THROMBECTOMY AV FISTULA W/THROMBOLYSIS/PTA INC/SHUNT/IMG LEFT Left 05/04/2022   IR THROMBECTOMY AV FISTULA W/THROMBOLYSIS/PTA/STENT INC/SHUNT/IMG LT Left 04/27/2022   IR US GUIDE VASC ACCESS LEFT  02/02/2022   IR US GUIDE VASC ACCESS LEFT  03/22/2022   IR US GUIDE VASC ACCESS LEFT  04/15/2022   IR US GUIDE VASC ACCESS LEFT  04/19/2022   IR US GUIDE VASC ACCESS LEFT  04/27/2022   IR US GUIDE VASC ACCESS RIGHT  03/26/2021   IR  US GUIDE VASC ACCESS RIGHT  05/13/2021   IR US GUIDE VASC ACCESS RIGHT  05/04/2022   REVISON OF ARTERIOVENOUS FISTULA Left 03/22/2018   Procedure: LEFT UPPER EXTREMITY ARTERIOVENOUS  REVISION WITH GORE-TEX GRAFT.;  Surgeon: Cephus Shelling, MD;  Location: [facility] OR;  Service: Vascular;  Laterality: Left;   TEE WITHOUT CARDIOVERSION N/A 05/12/2021   Procedure: TRANSESOPHAGEAL ECHOCARDIOGRAM (TEE);  Surgeon: Christell Constant, MD;  Location: [facility] ENDOSCOPY;  Service: Cardiovascular;  Laterality: N/A;   THROMBECTOMY Left 08/26/2019   thrombectomy of LUA loop AVG   THROMBECTOMY AND REVISION OF ARTERIOVENTOUS (AV) GORETEX  GRAFT Right 07/27/2020   Procedure: INSERTION OF RIGHT ARM LOOP GRAFT WITH EXCISION OF RIGHT ARM BRACHIAL AXILLARY GRAFT;  Surgeon: Cephus Shelling, MD;  Location: [facility] OR;  Service: Vascular;  Laterality: Right;   THROMBECTOMY BRACHIAL ARTERY Left 05/04/2022   Procedure: LEFT ARM RADIAL BRACHIAL ARTERY EMBOLECTOMY;  Surgeon: Victorino Sparrow, MD;   Location: [facility] OR;  Service: Vascular;  Laterality: Left;   UPPER EXTREMITY VENOGRAPHY Bilateral 06/14/2021   Procedure: UPPER EXTREMITY VENOGRAPHY;  Surgeon: Maeola Harman, MD;  Location: [facility] INVASIVE CV LAB;  Service: Cardiovascular;  Laterality: Bilateral;   VISCERAL ANGIOGRAPHY Right 06/17/2020   Procedure: CENTRAL VENO;  Surgeon: Leonie Douglas, MD;  Location: [facility] INVASIVE CV LAB;  Service: Cardiovascular;  Laterality: Right;   Social History   Occupational History   Not on file  Tobacco Use   Smoking status: Never   Smokeless tobacco: Never  Vaping Use   Vaping Use: Never used  Substance and Sexual Activity   Alcohol use: No   Drug use: Yes    Frequency: 2.0 times per week    Types: Marijuana    Comment: Last use was on 08/17/21   Sexual activity: Yes

## 2022-10-31 ENCOUNTER — Telehealth: Payer: Self-pay | Admitting: Orthopedic Surgery

## 2022-10-31 NOTE — Telephone Encounter (Signed)
Patient called needing Rx refilled Oxycodone. The number to contact patient is [phone] ?

## 2022-10-31 NOTE — Telephone Encounter (Signed)
10/01/2022 left foot 1st ray amputation. Requesting refill on Oxycodone last refill was 10/21/2022. Please advise.

## 2022-11-01 MED ORDER — OXYCODONE HCL 5 MG PO TABS: 5.0000 mg | ORAL_TABLET | Freq: Three times a day (TID) | ORAL | 0 refills | Status: DC | PRN

## 2022-11-07 ENCOUNTER — Other Ambulatory Visit (HOSPITAL_COMMUNITY): Payer: Self-pay | Admitting: Internal Medicine

## 2022-11-07 DIAGNOSIS — Z992 Dependence on renal dialysis: Secondary | ICD-10-CM

## 2022-11-07 DIAGNOSIS — N186 End stage renal disease: Secondary | ICD-10-CM

## 2022-11-09 ENCOUNTER — Telehealth: Payer: Self-pay | Admitting: Family

## 2022-11-09 ENCOUNTER — Ambulatory Visit (INDEPENDENT_AMBULATORY_CARE_PROVIDER_SITE_OTHER): Payer: Medicaid Other | Admitting: Family

## 2022-11-09 DIAGNOSIS — I739 Peripheral vascular disease, unspecified: Secondary | ICD-10-CM

## 2022-11-09 DIAGNOSIS — Z89432 Acquired absence of left foot: Secondary | ICD-10-CM

## 2022-11-09 NOTE — Progress Notes (Signed)
Office Visit Note   Patient: Paul [name]           Date of Birth: [birth year]           MRN: [id number] Visit Date: 11/09/2022              Requested by: Darral Dash, DO [address] [place],  [place] [address] PCP: Darral Dash, DO  Chief Complaint  Patient presents with   Left Foot - Routine Post Op    10/01/2022 left foot 1st ray amputation       HPI: The patient is a 32 year old gentleman who is seen status post left first ray amputation which unfortunately did dehisce.  Surgery was May 11 and he has been packing open with dry gauze and attempting to minimize his weightbearing. No fever no chills no concerning drainage Assessment & Plan: Visit Diagnoses:  1. 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 Peripheral arterial disease (HCC)     Plan: Continue daily Dial soap cleansing packing open.  Discussed the importance of nonweightbearing.  We will attempt prior authorization for an office Kerecis placement  Follow-Up Instructions: No follow-ups on file.   Ortho Exam  Patient is alert, oriented, no adenopathy, well-dressed, normal affect, normal respiratory effort.  On examination left lower extremity the medial ray amputation has dehisced this 8 cm x 3 cm is 15 mm deep there is no exposed bone or tendon there is scant fibrinous tissue no epiboly scant bloody drainage filled in with granulation well-appearing wound bed  Imaging: No results found.   Labs: Lab Results  Component Value Date   HGBA1C 7.4 (H) 09/30/2022   HGBA1C 6.3 (H) 04/18/2022   HGBA1C 6.2 09/22/2021   REPTSTATUS 10/06/2022 FINAL 10/01/2022   GRAMSTAIN  10/01/2022    FEW WBC PRESENT, PREDOMINANTLY PMN RARE GRAM POSITIVE COCCI RARE SQUAMOUS EPITHELIAL CELLS PRESENT    CULT  10/01/2022    RARE ESCHERICHIA COLI RARE STENOTROPHOMONAS MALTOPHILIA NO ANAEROBES ISOLATED Performed at [facility], [address], [place], [place] [address]    [facility] ESCHERICHIA COLI  10/01/2022   LABORGA STENOTROPHOMONAS MALTOPHILIA 10/01/2022     Lab Results  Component Value Date   ALBUMIN 3.3 (L) 09/30/2022   ALBUMIN 4.4 09/29/2022   ALBUMIN 3.7 05/05/2022    Lab Results  Component Value Date   MG 2.8 (H) 09/26/2016   MG 2.8 (H) 09/25/2016   MG 2.8 (H) 09/24/2016   Lab Results  Component Value Date   VD25OH 6.8 (L) 09/14/2016    No results found for: "PREALBUMIN"    Latest Ref Rng & Units 10/02/2022   12:21 AM 10/01/2022    4:06 PM 10/01/2022   12:26 AM  CBC EXTENDED  WBC 4.0 - 10.5 K/uL 10.6  8.5  8.1   RBC 4.22 - 5.81 MIL/uL 5.15  4.66  4.97   Hemoglobin 13.0 - 17.0 g/dL 21.3  08.6  57.8   HCT 39.0 - 52.0 % 41.4  37.9  40.5   Platelets 150 - 400 K/uL 261  241  242      There is no height or weight on file to calculate BMI.  Orders:  Orders Placed This Encounter  Procedures   Ambulatory referral to Vascular Surgery   No orders of the defined types were placed in this encounter.    Procedures: No procedures performed  Clinical Data: No additional findings.  ROS:  All other systems negative, except as noted in the  HPI. Review of Systems  Objective: Vital Signs: There were no vitals taken for this visit.  Specialty Comments:  No specialty comments available.  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   Thrombus 05/04/2022   Ischemia of left upper extremity 05/04/2022   Complication of vascular access for dialysis 04/19/2022   ESRD (end stage renal disease) (HCC) 04/19/2022   Failure of hemodialysis access (HCC) 04/18/2022   Left hand pain 09/22/2021   Diabetic foot infection (HCC)    Wound infection 05/07/2021   MSSA bacteremia 05/07/2021   Osteomyelitis (HCC) 05/07/2021   Abscess 05/07/2021   Sepsis due to cellulitis (HCC) 05/06/2021   Poor compliance with  medication 03/17/2021   Fluid overload, unspecified 07/09/2020   Complication of vascular dialysis catheter 03/23/2020   Other disorders of phosphorus metabolism 01/28/2020   Hypertension 01/08/2020   Hypocalcemia 10/29/2019   ESRD on dialysis (HCC) 11/16/2017   Pruritus, unspecified 04/04/2017   Unspecified protein-calorie malnutrition (HCC) 11/11/2016   Headache, unspecified 10/29/2016   Encounter for immunization 10/19/2016   Iron deficiency anemia, unspecified 10/10/2016   Anemia in chronic kidney disease 09/30/2016   Coagulation defect, unspecified (HCC) 09/30/2016   Pneumonia due to Pseudomonas (HCC) 09/30/2016   Secondary hyperparathyroidism of renal origin (HCC) 09/30/2016   Hypothyroidism 10/29/2009   Type 2 diabetes mellitus (HCC) 04/14/2009   Hyperlipidemia 04/14/2009   OBESITY, MORBID 04/14/2009   Essential hypertension, benign 04/14/2009   Past Medical History:  Diagnosis Date   Acute hypoxemic respiratory failure (HCC) 09/11/2016   Anemia    Chronic kidney disease    ARF on CRF Dialysis T/TH/Sa   Diabetes mellitus    Type II   End stage renal disease on dialysis (HCC)    [place]    GERD (gastroesophageal reflux disease)    diet controlled   HLD (hyperlipidemia)    Hypertension    Hypothyroidism    Morbid obesity (HCC)    Sacral wound    resolved   Thyroid disease     Family History  Problem Relation Age of Onset   Heart disease Mother    Hypertension Mother     Past Surgical History:  Procedure Laterality Date   AMPUTATION Left 05/08/2021   Procedure: AMPUTATION GREAT TOE;  Surgeon: Nadara Mustard, MD;  Location: [facility] OR;  Service: Orthopedics;  Laterality: Left;   AMPUTATION Left 10/01/2022   Procedure: LEFT FOOT AMPUTATION RAY 1st;  Surgeon: Nadara Mustard, MD;  Location: [facility] OR;  Service: Orthopedics;  Laterality: Left;   AV FISTULA PLACEMENT Left 09/26/2016   Procedure: LEFT UPPER ARM ARTERIOVENOUS (AV) FISTULA CREATION;  Surgeon: Sherren Kerns, MD;  Location: [facility] OR;  Service: Vascular;  Laterality: Left;   AV FISTULA PLACEMENT Left 12/02/2016   Procedure: INSERTION OF ARTERIOVENOUS GORE-TEX GRAFT LEFT UPPER  ARM USING A 4-7MM BY 45CM GRAFT ;  Surgeon: Larina Earthly, MD;  Location: [facility] OR;  Service: Vascular;  Laterality: Left;   AV FISTULA PLACEMENT Right 07/03/2020   Procedure: INSERTION OF ARTERIOVENOUS (AV) GORE-TEX GRAFT RIGHT UPPER ARM;  Surgeon: Cephus Shelling, MD;  Location: [facility] OR;  Service: Vascular;  Laterality: Right;   AV FISTULA PLACEMENT Left 07/02/2021   Procedure: LEFT UPPER EXTREMITY ARTERIOVENOUS FISTULA CREATION;  Surgeon: Maeola Harman, MD;  Location: [facility] OR;  Service: Vascular;  Laterality: Left;   BASCILIC VEIN TRANSPOSITION Right 05/06/2020   Procedure: FIRST STAGE RIGHT BASCILIC VEIN TRANSPOSITION;  Surgeon: Nada Libman, MD;  Location: [facility] OR;  Service: Vascular;  Laterality: Right;   BASCILIC VEIN TRANSPOSITION Left 08/20/2021   Procedure: LEFT ARM SECOND STAGE WITH ARTERIOVENOUS GRAFT;  Surgeon: Maeola Harman, MD;  Location: [facility] OR;  Service: Vascular;  Laterality: Left;   EXCHANGE OF A DIALYSIS CATHETER Right 10/08/2016   Procedure: EXCHANGE OF A DIALYSIS CATHETER-RIGHT INTERNAL JUGULAR;  Surgeon: Fransisco Hertz, MD;  Location: [facility] OR;  Service: Vascular;  Laterality: Right;   INSERTION OF DIALYSIS CATHETER Right 09/26/2016   Procedure: INSERTION OF DIALYSIS CATHETER - Right Internal Jugular Placement;  Surgeon: Sherren Kerns, MD;  Location: [facility] OR;  Service: Vascular;  Laterality: Right;   INSERTION OF DIALYSIS CATHETER Left 03/22/2018   Procedure: INSERTION OF DIALYSIS CATHETER;  Surgeon: Cephus Shelling, MD;  Location: [facility] OR;  Service: Vascular;  Laterality: Left;   INTRAOPERATIVE ARTERIOGRAM Left 05/04/2022   Procedure: INTRA OPERATIVE ARTERIOGRAM;  Surgeon: Victorino Sparrow, MD;  Location: [facility] OR;  Service: Vascular;  Laterality: Left;   IR AV DIALY SHUNT INTRO  NEEDLE/INTRACATH INITIAL W/PTA/IMG LEFT  08/19/2022   IR DIALY SHUNT INTRO NEEDLE/INTRACATH INITIAL W/IMG LEFT Left 04/15/2022   IR FLUORO GUIDE CV LINE LEFT  04/19/2022   IR FLUORO GUIDE CV LINE LEFT  05/30/2022   IR FLUORO GUIDE CV LINE RIGHT  03/26/2021   IR FLUORO GUIDE CV LINE RIGHT  05/13/2021   IR FLUORO GUIDE CV LINE RIGHT  05/04/2022   IR REMOVAL TUN CV CATH W/O FL  05/11/2021   IR REMOVAL TUN CV CATH W/O FL  10/06/2021   IR THROMBECTOMY AV FISTULA W/THROMBOLYSIS/PTA INC/SHUNT/IMG LEFT Left 02/02/2022   IR THROMBECTOMY AV FISTULA W/THROMBOLYSIS/PTA INC/SHUNT/IMG LEFT Left 03/22/2022   IR THROMBECTOMY AV FISTULA W/THROMBOLYSIS/PTA INC/SHUNT/IMG LEFT Left 05/04/2022   IR THROMBECTOMY AV FISTULA W/THROMBOLYSIS/PTA/STENT INC/SHUNT/IMG LT Left 04/27/2022   IR US GUIDE VASC ACCESS LEFT  02/02/2022   IR US GUIDE VASC ACCESS LEFT  03/22/2022   IR US GUIDE VASC ACCESS LEFT  04/15/2022   IR US GUIDE VASC ACCESS LEFT  04/19/2022   IR US GUIDE VASC ACCESS LEFT  04/27/2022   IR US GUIDE VASC ACCESS RIGHT  03/26/2021   IR US GUIDE VASC ACCESS RIGHT  05/13/2021   IR US GUIDE VASC ACCESS RIGHT  05/04/2022   REVISON OF ARTERIOVENOUS FISTULA Left 03/22/2018   Procedure: LEFT UPPER EXTREMITY ARTERIOVENOUS  REVISION WITH GORE-TEX GRAFT.;  Surgeon: Cephus Shelling, MD;  Location: [facility] OR;  Service: Vascular;  Laterality: Left;   TEE WITHOUT CARDIOVERSION N/A 05/12/2021   Procedure: TRANSESOPHAGEAL ECHOCARDIOGRAM (TEE);  Surgeon: Christell Constant, MD;  Location: [facility] ENDOSCOPY;  Service: Cardiovascular;  Laterality: N/A;   THROMBECTOMY Left 08/26/2019   thrombectomy of LUA loop AVG   THROMBECTOMY AND REVISION OF ARTERIOVENTOUS (AV) GORETEX  GRAFT Right 07/27/2020   Procedure: INSERTION OF RIGHT ARM LOOP GRAFT WITH EXCISION OF RIGHT ARM BRACHIAL AXILLARY GRAFT;  Surgeon: Cephus Shelling, MD;  Location: [facility] OR;  Service: Vascular;  Laterality: Right;   THROMBECTOMY BRACHIAL ARTERY Left 05/04/2022    Procedure: LEFT ARM RADIAL BRACHIAL ARTERY EMBOLECTOMY;  Surgeon: Victorino Sparrow, MD;  Location: [facility] OR;  Service: Vascular;  Laterality: Left;   UPPER EXTREMITY VENOGRAPHY Bilateral 06/14/2021   Procedure: UPPER EXTREMITY VENOGRAPHY;  Surgeon: Maeola Harman, MD;  Location: [facility] INVASIVE CV LAB;  Service: Cardiovascular;  Laterality: Bilateral;   VISCERAL ANGIOGRAPHY Right 06/17/2020   Procedure: CENTRAL VENO;  Surgeon: Leonie Douglas, MD;  Location: [facility] INVASIVE CV LAB;  Service: Cardiovascular;  Laterality: Right;   Social History   Occupational History   Not on file  Tobacco Use   Smoking status: Never   Smokeless tobacco: Never  Vaping Use   Vaping Use: Never used  Substance and Sexual Activity   Alcohol use: No   Drug use: Yes    Frequency: 2.0 times per week    Types: Marijuana    Comment: Last use was on 08/17/21   Sexual activity: Yes

## 2022-11-09 NOTE — Telephone Encounter (Signed)
Pt called requesting refill of oxycodone.Please send to pharmacy on file. Pt phone number is [phone].

## 2022-11-10 MED ORDER — OXYCODONE HCL 5 MG PO TABS: 5.0000 mg | ORAL_TABLET | Freq: Three times a day (TID) | ORAL | 0 refills | Status: DC | PRN

## 2022-11-10 NOTE — Telephone Encounter (Signed)
Pt was in office yesterday s/p ray amputation requesting refill of Oxycodone. Last refill was 11/01/2022 # 30 please advise.

## 2022-11-11 ENCOUNTER — Telehealth: Payer: Self-pay | Admitting: Orthopedic Surgery

## 2022-11-11 ENCOUNTER — Other Ambulatory Visit: Payer: Self-pay | Admitting: Orthopedic Surgery

## 2022-11-11 MED ORDER — OXYCODONE HCL 5 MG PO TABS: 5.0000 mg | ORAL_TABLET | Freq: Three times a day (TID) | ORAL | 0 refills | Status: DC | PRN

## 2022-11-11 NOTE — Telephone Encounter (Signed)
Tad' mother called in to check on the status of the pain medication refill request.  I see previous note was sent to [facility] to review: Pt was in office yesterday s/p ray amputation requesting refill of Oxycodone. Last refill was 11/01/2022 # 30 please advise.

## 2022-11-11 NOTE — Telephone Encounter (Signed)
Please see below.

## 2022-11-12 ENCOUNTER — Other Ambulatory Visit: Payer: Self-pay | Admitting: Physician Assistant

## 2022-11-12 DIAGNOSIS — E1122 Type 2 diabetes mellitus with diabetic chronic kidney disease: Secondary | ICD-10-CM

## 2022-11-14 ENCOUNTER — Ambulatory Visit (HOSPITAL_COMMUNITY)
Admission: RE | Admit: 2022-11-14 | Discharge: 2022-11-14 | Disposition: A | Discharge status: Home / Self Care | Payer: Medicaid Other | Source: Ambulatory Visit | Attending: Internal Medicine | Admitting: Internal Medicine

## 2022-11-14 ENCOUNTER — Other Ambulatory Visit: Payer: Self-pay

## 2022-11-14 ENCOUNTER — Encounter (HOSPITAL_COMMUNITY): Payer: Self-pay

## 2022-11-14 DIAGNOSIS — N186 End stage renal disease: Secondary | ICD-10-CM | POA: Diagnosis not present

## 2022-11-14 DIAGNOSIS — T82858A Stenosis of vascular prosthetic devices, implants and grafts, initial encounter: Secondary | ICD-10-CM | POA: Insufficient documentation

## 2022-11-14 DIAGNOSIS — Z992 Dependence on renal dialysis: Secondary | ICD-10-CM | POA: Diagnosis not present

## 2022-11-14 DIAGNOSIS — Y841 Kidney dialysis as the cause of abnormal reaction of the patient, or of later complication, without mention of misadventure at the time of the procedure: Secondary | ICD-10-CM | POA: Diagnosis not present

## 2022-11-14 DIAGNOSIS — I120 Hypertensive chronic kidney disease with stage 5 chronic kidney disease or end stage renal disease: Secondary | ICD-10-CM | POA: Insufficient documentation

## 2022-11-14 DIAGNOSIS — E1122 Type 2 diabetes mellitus with diabetic chronic kidney disease: Secondary | ICD-10-CM | POA: Diagnosis not present

## 2022-11-14 DIAGNOSIS — Z794 Long term (current) use of insulin: Secondary | ICD-10-CM | POA: Insufficient documentation

## 2022-11-14 HISTORY — PX: IR AV DIALY SHUNT INTRO NEEDLE/INTRACATH INITIAL W/PTA/IMG LEFT: IMG6103

## 2022-11-14 HISTORY — PX: IR DIALY SHUNT INTRO NEEDLE/INTRACATH INITIAL W/IMG LEFT: IMG6102

## 2022-11-14 LAB — GLUCOSE, CAPILLARY: Glucose-Capillary: 117 mg/dL — ABNORMAL HIGH (ref 70–99)

## 2022-11-14 MED ORDER — FENTANYL CITRATE (PF) 100 MCG/2ML IJ SOLN
INTRAMUSCULAR | Status: AC | PRN
  Administered 2022-11-14 (×2): 25 ug via INTRAVENOUS

## 2022-11-14 MED ORDER — HEPARIN SODIUM (PORCINE) 1000 UNIT/ML IJ SOLN
INTRAMUSCULAR | Status: AC
  Filled 2022-11-14: qty 10

## 2022-11-14 MED ORDER — SODIUM CHLORIDE 0.9 % IV SOLN: INTRAVENOUS | Status: DC

## 2022-11-14 MED ORDER — HEPARIN SODIUM (PORCINE) 1000 UNIT/ML IJ SOLN
INTRAMUSCULAR | Status: AC | PRN
  Administered 2022-11-14: 4000 [IU] via INTRAVENOUS

## 2022-11-14 MED ORDER — MIDAZOLAM HCL 2 MG/2ML IJ SOLN
INTRAMUSCULAR | Status: AC
  Filled 2022-11-14: qty 2

## 2022-11-14 MED ORDER — IOHEXOL 300 MG/ML  SOLN
100.0000 mL | Freq: Once | INTRAMUSCULAR | Status: AC | PRN
  Administered 2022-11-14: 60 mL via INTRA_ARTERIAL

## 2022-11-14 MED ORDER — FENTANYL CITRATE (PF) 100 MCG/2ML IJ SOLN
INTRAMUSCULAR | Status: AC
  Filled 2022-11-14: qty 2

## 2022-11-14 MED ORDER — MIDAZOLAM HCL 2 MG/2ML IJ SOLN
INTRAMUSCULAR | Status: AC | PRN
  Administered 2022-11-14: .5 mg via INTRAVENOUS

## 2022-11-14 MED ORDER — LIDOCAINE HCL 1 % IJ SOLN
INTRAMUSCULAR | Status: AC
  Filled 2022-11-14: qty 20

## 2022-11-14 MED ORDER — LIDOCAINE HCL 1 % IJ SOLN
20.0000 mL | Freq: Once | INTRAMUSCULAR | Status: AC
  Administered 2022-11-14: 5 mL

## 2022-11-14 NOTE — Procedures (Addendum)
Interventional Radiology Procedure Note  Procedure:   US guided access LUE fistula, brachial/brachial.  Treatment of serial outflow stenosis, ~70% at access site, ~70% in-stent, ~80-90% at proximal stent margin, and ~80-90% at distal stent margin.  0 residual after treatment.   Fistulagram  Excellent thrill on completion  Complications: None  Recommendations:  - Ok to use - Recommend duplex to evaluate occult stenosis and flow rate/volume - DC 1 hr - Routine wound/stay-stitch care   Signed,  Yvone Neu. Loreta Ave, DO

## 2022-11-14 NOTE — H&P (Signed)
Chief Complaint: Patient was seen in consultation today for left upper arm dialysis fistula evaluation and possible intervention at the request of Peeples,Samuel J  Referring Physician(s): Peeples,Samuel J  Supervising Physician: Gilmer Mor  Patient Status: [facility] - Out-pt  History of Present Illness: Paul [name] is a 32 y.o. male   FULL Code status per pt Fistula placed 06/2021 Most recent IR intervention 08/19/22: balloon angioplasty  Last dialysis Thursday 6/20: complete Sat 6/22: difficult access and clotting per pt Did not dialyze  Scheduled now for evaluation./intervention Possible tunneled dialysis catheter placement   Past Medical History:  Diagnosis Date   Acute hypoxemic respiratory failure (HCC) 09/11/2016   Anemia    Chronic kidney disease    ARF on CRF Dialysis T/TH/Sa   Diabetes mellitus    Type II   End stage renal disease on dialysis (HCC)    [place]    GERD (gastroesophageal reflux disease)    diet controlled   HLD (hyperlipidemia)    Hypertension    Hypothyroidism    Morbid obesity (HCC)    Sacral wound    resolved   Thyroid disease     Past Surgical History:  Procedure Laterality Date   AMPUTATION Left 05/08/2021   Procedure: AMPUTATION GREAT TOE;  Surgeon: Nadara Mustard, MD;  Location: [facility] OR;  Service: Orthopedics;  Laterality: Left;   AMPUTATION Left 10/01/2022   Procedure: LEFT FOOT AMPUTATION RAY 1st;  Surgeon: Nadara Mustard, MD;  Location: [facility] OR;  Service: Orthopedics;  Laterality: Left;   AV FISTULA PLACEMENT Left 09/26/2016   Procedure: LEFT UPPER ARM ARTERIOVENOUS (AV) FISTULA CREATION;  Surgeon: Sherren Kerns, MD;  Location: [facility] OR;  Service: Vascular;  Laterality: Left;   AV FISTULA PLACEMENT Left 12/02/2016   Procedure: INSERTION OF ARTERIOVENOUS GORE-TEX GRAFT LEFT UPPER  ARM USING A 4-7MM BY 45CM GRAFT ;  Surgeon: Larina Earthly, MD;  Location: [facility] OR;  Service: Vascular;  Laterality: Left;   AV FISTULA PLACEMENT  Right 07/03/2020   Procedure: INSERTION OF ARTERIOVENOUS (AV) GORE-TEX GRAFT RIGHT UPPER ARM;  Surgeon: Cephus Shelling, MD;  Location: [facility] OR;  Service: Vascular;  Laterality: Right;   AV FISTULA PLACEMENT Left 07/02/2021   Procedure: LEFT UPPER EXTREMITY ARTERIOVENOUS FISTULA CREATION;  Surgeon: Maeola Harman, MD;  Location: [facility] OR;  Service: Vascular;  Laterality: Left;   BASCILIC VEIN TRANSPOSITION Right 05/06/2020   Procedure: FIRST STAGE RIGHT BASCILIC VEIN TRANSPOSITION;  Surgeon: Nada Libman, MD;  Location: [facility] OR;  Service: Vascular;  Laterality: Right;   BASCILIC VEIN TRANSPOSITION Left 08/20/2021   Procedure: LEFT ARM SECOND STAGE WITH ARTERIOVENOUS GRAFT;  Surgeon: Maeola Harman, MD;  Location: [facility] OR;  Service: Vascular;  Laterality: Left;   EXCHANGE OF A DIALYSIS CATHETER Right 10/08/2016   Procedure: EXCHANGE OF A DIALYSIS CATHETER-RIGHT INTERNAL JUGULAR;  Surgeon: Fransisco Hertz, MD;  Location: [facility] OR;  Service: Vascular;  Laterality: Right;   INSERTION OF DIALYSIS CATHETER Right 09/26/2016   Procedure: INSERTION OF DIALYSIS CATHETER - Right Internal Jugular Placement;  Surgeon: Sherren Kerns, MD;  Location: [facility] OR;  Service: Vascular;  Laterality: Right;   INSERTION OF DIALYSIS CATHETER Left 03/22/2018   Procedure: INSERTION OF DIALYSIS CATHETER;  Surgeon: Cephus Shelling, MD;  Location: [facility] OR;  Service: Vascular;  Laterality: Left;   INTRAOPERATIVE ARTERIOGRAM Left 05/04/2022   Procedure: INTRA OPERATIVE ARTERIOGRAM;  Surgeon: Victorino Sparrow, MD;  Location: [facility] OR;  Service: Vascular;  Laterality:  Left;   IR AV DIALY SHUNT INTRO NEEDLE/INTRACATH INITIAL W/PTA/IMG LEFT  08/19/2022   IR DIALY SHUNT INTRO NEEDLE/INTRACATH INITIAL W/IMG LEFT Left 04/15/2022   IR FLUORO GUIDE CV LINE LEFT  04/19/2022   IR FLUORO GUIDE CV LINE LEFT  05/30/2022   IR FLUORO GUIDE CV LINE RIGHT  03/26/2021   IR FLUORO GUIDE CV LINE RIGHT  05/13/2021   IR FLUORO GUIDE CV LINE  RIGHT  05/04/2022   IR REMOVAL TUN CV CATH W/O FL  05/11/2021   IR REMOVAL TUN CV CATH W/O FL  10/06/2021   IR THROMBECTOMY AV FISTULA W/THROMBOLYSIS/PTA INC/SHUNT/IMG LEFT Left 02/02/2022   IR THROMBECTOMY AV FISTULA W/THROMBOLYSIS/PTA INC/SHUNT/IMG LEFT Left 03/22/2022   IR THROMBECTOMY AV FISTULA W/THROMBOLYSIS/PTA INC/SHUNT/IMG LEFT Left 05/04/2022   IR THROMBECTOMY AV FISTULA W/THROMBOLYSIS/PTA/STENT INC/SHUNT/IMG LT Left 04/27/2022   IR US GUIDE VASC ACCESS LEFT  02/02/2022   IR US GUIDE VASC ACCESS LEFT  03/22/2022   IR US GUIDE VASC ACCESS LEFT  04/15/2022   IR US GUIDE VASC ACCESS LEFT  04/19/2022   IR US GUIDE VASC ACCESS LEFT  04/27/2022   IR US GUIDE VASC ACCESS RIGHT  03/26/2021   IR US GUIDE VASC ACCESS RIGHT  05/13/2021   IR US GUIDE VASC ACCESS RIGHT  05/04/2022   REVISON OF ARTERIOVENOUS FISTULA Left 03/22/2018   Procedure: LEFT UPPER EXTREMITY ARTERIOVENOUS  REVISION WITH GORE-TEX GRAFT.;  Surgeon: Cephus Shelling, MD;  Location: [facility] OR;  Service: Vascular;  Laterality: Left;   TEE WITHOUT CARDIOVERSION N/A 05/12/2021   Procedure: TRANSESOPHAGEAL ECHOCARDIOGRAM (TEE);  Surgeon: Christell Constant, MD;  Location: [facility] ENDOSCOPY;  Service: Cardiovascular;  Laterality: N/A;   THROMBECTOMY Left 08/26/2019   thrombectomy of LUA loop AVG   THROMBECTOMY AND REVISION OF ARTERIOVENTOUS (AV) GORETEX  GRAFT Right 07/27/2020   Procedure: INSERTION OF RIGHT ARM LOOP GRAFT WITH EXCISION OF RIGHT ARM BRACHIAL AXILLARY GRAFT;  Surgeon: Cephus Shelling, MD;  Location: [facility] OR;  Service: Vascular;  Laterality: Right;   THROMBECTOMY BRACHIAL ARTERY Left 05/04/2022   Procedure: LEFT ARM RADIAL BRACHIAL ARTERY EMBOLECTOMY;  Surgeon: Victorino Sparrow, MD;  Location: [facility] OR;  Service: Vascular;  Laterality: Left;   UPPER EXTREMITY VENOGRAPHY Bilateral 06/14/2021   Procedure: UPPER EXTREMITY VENOGRAPHY;  Surgeon: Maeola Harman, MD;  Location: [facility] INVASIVE CV LAB;  Service:  Cardiovascular;  Laterality: Bilateral;   VISCERAL ANGIOGRAPHY Right 06/17/2020   Procedure: CENTRAL VENO;  Surgeon: Leonie Douglas, MD;  Location: [facility] INVASIVE CV LAB;  Service: Cardiovascular;  Laterality: Right;    Allergies: Hydralazine hcl  Medications: Prior to Admission medications   Medication Sig Start Date End Date Taking? Authorizing Provider  aspirin EC 81 MG tablet Take 1 tablet (81 mg total) by mouth daily. Swallow whole. 05/11/22  Yes Dameron, Nolberto Hanlon, DO  calcium carbonate (TUMS - DOSED IN MG ELEMENTAL CALCIUM) 500 MG chewable tablet Chew 1,000 mg by mouth daily as needed for indigestion or heartburn.   Yes [provider]  cetirizine (ZYRTEC) 10 MG tablet Take 1 tablet (10 mg total) by mouth daily as needed for allergies. 05/11/22  Yes Dameron, Nolberto Hanlon, DO  cinacalcet (SENSIPAR) 30 MG tablet Take 30 mg by mouth daily. 08/12/21  Yes [provider]  ciprofloxacin (CIPRO) 500 MG tablet Take 1 tablet (500 mg total) by mouth 2 (two) times daily. 10/03/22   Nadara Mustard, MD  ELIQUIS 5 MG TABS tablet Take 1 tablet by mouth twice daily 10/24/22  Yes Dameron, Nolberto Hanlon, DO  ferric citrate (AURYXIA) 1 GM 210 MG(Fe) tablet Take 2-4 tablets (420-840 mg total) by mouth See admin instructions. Take 4 tablets (840 mg) by mouth with each meal & take 2 tablets (420 mg) by mouth with each snack. 05/11/22  Yes Dameron, Nolberto Hanlon, DO  gabapentin (NEURONTIN) 100 MG capsule TAKE 1 CAPSULE BY MOUTH THREE TIMES DAILY 05/09/22  Yes Maeola Harman, MD  gabapentin (NEURONTIN) 300 MG capsule Take 1 capsule by mouth at bedtime 07/25/22  Yes Dameron, Nolberto Hanlon, DO  LANTUS 100 UNIT/ML injection Inject 0.05 mLs (5 Units total) into the skin 2 (two) times daily. 05/11/22  Yes Dameron, Nolberto Hanlon, DO  levothyroxine (SYNTHROID) 150 MCG tablet TAKE 1 TABLET BY MOUTH ONCE DAILY BEFORE BREAKFAST *APPOINTMENT NEEDED FOR FURTHER REFILLS* Patient taking differently: Take 150 mcg by mouth daily before  breakfast. 09/26/22  Yes Dameron, Nolberto Hanlon, DO  midodrine (PROAMATINE) 5 MG tablet Take 1 tablet (5 mg total) by mouth Every Tuesday,Thursday,and Saturday with dialysis. 05/12/22  Yes Dameron, Nolberto Hanlon, DO  multivitamin (RENA-VIT) TABS tablet Take 1 tablet by mouth at bedtime. 05/11/22  Yes Dameron, Nolberto Hanlon, DO  oxyCODONE (OXY IR/ROXICODONE) 5 MG immediate release tablet Take 1 tablet (5 mg total) by mouth every 8 (eight) hours as needed for severe pain (pain score 4-6). 11/11/22  Yes Nadara Mustard, MD  rosuvastatin (CRESTOR) 20 MG tablet Take 1 tablet by mouth once daily 08/23/22  Yes Dameron, Nolberto Hanlon, DO  ACCU-CHEK GUIDE test strip USE AS DIRECTED TWICE DAILY 10/27/22   Alfredo Martinez, MD  Accu-Chek Softclix Lancets lancets Use as instructed Patient not taking: Reported on 09/30/2022 06/11/21   Alicia Amel, MD  ascorbic acid (VITAMIN C) 1000 MG tablet Take 1 tablet (1,000 mg total) by mouth daily. 10/03/22   Dameron, Nolberto Hanlon, DO  Blood Glucose Monitoring Suppl (ACCU-CHEK GUIDE ME) w/Device KIT 1 kit by Does not apply route daily. Patient not taking: Reported on 09/30/2022 05/22/21   Alicia Amel, MD  doxycycline (VIBRA-TABS) 100 MG tablet Take 1 tablet (100 mg total) by mouth 2 (two) times daily. 10/01/22   Nadara Mustard, MD  doxycycline (VIBRA-TABS) 100 MG tablet Take 1 tablet (100 mg total) by mouth every 12 (twelve) hours. 10/03/22   Dameron, Nolberto Hanlon, DO  polyethylene glycol (MIRALAX / GLYCOLAX) 17 g packet Take 17 g by mouth daily as needed for mild constipation. 10/02/22   Dameron, Nolberto Hanlon, DO  Syringe, Disposable, (B-D SYRINGE SLIP TIP 30CC) 30 ML MISC 1 Syringe by Does not apply route daily. Patient not taking: Reported on 09/30/2022 01/31/19   Melene Plan, MD  zinc sulfate 220 (50 Zn) MG capsule Take 1 capsule (220 mg total) by mouth daily. 10/03/22   Darral Dash, DO     Family History  Problem Relation Age of Onset   Heart disease Mother    Hypertension Mother     Social History    Socioeconomic History   Marital status: Single    Spouse name: Not on file   Number of children: Not on file   Years of education: Not on file   Highest education level: Not on file  Occupational History   Not on file  Tobacco Use   Smoking status: Never   Smokeless tobacco: Never  Vaping Use   Vaping Use: Never used  Substance and Sexual Activity   Alcohol use: No   Drug use: Yes    Frequency: 2.0 times per week    Types: Marijuana  Comment: Last use was on 08/17/21   Sexual activity: Yes  Other Topics Concern   Not on file  Social History Narrative   Not on file   Social Determinants of Health   Financial Resource Strain: Not on file  Food Insecurity: No Food Insecurity (09/29/2022)   Hunger Vital Sign    Worried About Running Out of Food in the Last Year: Never true    Ran Out of Food in the Last Year: Never true  Transportation Needs: No Transportation Needs (09/29/2022)   PRAPARE - Administrator, Civil Service (Medical): No    Lack of Transportation (Non-Medical): No  Physical Activity: Insufficiently Active (07/07/2021)   Exercise Vital Sign    Days of Exercise per Week: 4 days    Minutes of Exercise per Session: 30 min  Stress: Stress Concern Present (08/06/2021)   [employer] of Occupational Health - Occupational Stress Questionnaire    Feeling of Stress : To some extent  Social Connections: Socially Isolated (06/07/2021)   Social Connection and Isolation Panel [NHANES]    Frequency of Communication with Friends and Family: More than three times a week    Frequency of Social Gatherings with Friends and Family: More than three times a week    Attends Religious Services: Never    Database administrator or Organizations: No    Attends Banker Meetings: Never    Marital Status: Never married    Review of Systems: A 12 point ROS discussed and pertinent positives are indicated in the HPI above.  All other systems are  negative.  Review of Systems  Constitutional:  Negative for activity change, appetite change and fever.  Respiratory:  Negative for cough and shortness of breath.   Cardiovascular:  Negative for chest pain.  Gastrointestinal:  Negative for abdominal pain.  Psychiatric/Behavioral:  Negative for behavioral problems and confusion.     Vital Signs: BP 114/82   Pulse 83   Temp (!) 97.2 F (36.2 C) (Temporal)   Resp 18   Ht 6\' 2"  (1.88 m)   Wt (!) 330 lb (149.7 kg)   SpO2 100%   BMI 42.37 kg/m     Physical Exam Vitals reviewed.  Constitutional:      Appearance: He is obese.  HENT:     Mouth/Throat:     Mouth: Mucous membranes are moist.  Cardiovascular:     Rate and Rhythm: Normal rate and regular rhythm.     Heart sounds: Normal heart sounds.  Pulmonary:     Effort: Pulmonary effort is normal.     Breath sounds: Normal breath sounds.  Abdominal:     Palpations: Abdomen is soft.  Musculoskeletal:        General: Normal range of motion.     Comments: Left upper arm dialysis fistula Good pulse No thrill  Skin:    General: Skin is warm.  Neurological:     Mental Status: He is alert and oriented to person, place, and time.  Psychiatric:        Behavior: Behavior normal.     Imaging: No results found.  Labs:  CBC: Recent Labs    09/30/22 0033 10/01/22 0026 10/01/22 1606 10/02/22 0021  WBC 7.1 8.1 8.5 10.6*  HGB 14.4 13.3 12.3* 13.4  HCT 43.2 40.5 37.9* 41.4  PLT 222 242 241 261    COAGS: Recent Labs    09/29/22 1250  INR 1.8*  APTT 37*    BMP: Recent  Labs    05/05/22 0508 09/29/22 1219 09/30/22 0033 10/01/22 0026  NA 135 133* 135 134*  K 5.3* 3.8 3.6 3.6  CL 98 93* 93* 94*  CO2 19* 20* 24 22  GLUCOSE 164* 159* 187* 182*  BUN 58* 28* 40* 57*  CALCIUM 8.5* 9.6 9.0 8.1*  CREATININE 14.80* 11.31* 12.87* 15.17*  GFRNONAA 4* 6* 5* 4*    LIVER FUNCTION TESTS: Recent Labs    04/18/22 1014 04/19/22 0901 04/20/22 0608 05/05/22 0508  09/29/22 1219 09/30/22 0033  BILITOT 0.7 0.7  --   --  0.8  --   AST 12* 15  --   --  34  --   ALT 13 13  --   --  23  --   ALKPHOS 51 51  --   --  72  --   PROT 7.6 7.4  --   --  9.2*  --   ALBUMIN 4.1 3.9 4.2 3.7 4.4 3.3*    TUMOR MARKERS: No results for input(s): "AFPTM", "CEA", "CA199", "CHROMGRNA" in the last 8760 hours.  Assessment and Plan:  Scheduled for left upper arm dialysis fistula evaluation/intervention Possible tunneled dialysis catheter placement Risks and benefits discussed with the patient including, but not limited to bleeding, infection, vascular injury, pulmonary embolism, need for tunneled HD catheter placement or even death.  All of the patient's questions were answered, patient is agreeable to proceed. Consent signed and in chart.  Thank you for this interesting consult.  I greatly enjoyed meeting Paul [name] and look forward to participating in their care.  A copy of this report was sent to the requesting provider on this date.  Electronically Signed: Robet Leu, PA-C 11/14/2022, 10:51 AM   I spent a total of    25 Minutes in face to face in clinical consultation, greater than 50% of which was counseling/coordinating care for left upper arm dialysis fistula evaluation

## 2022-11-15 ENCOUNTER — Encounter (HOSPITAL_COMMUNITY): Payer: Self-pay | Admitting: Radiology

## 2022-11-15 ENCOUNTER — Other Ambulatory Visit (HOSPITAL_COMMUNITY): Payer: Self-pay | Admitting: Internal Medicine

## 2022-11-15 DIAGNOSIS — N186 End stage renal disease: Secondary | ICD-10-CM

## 2022-11-18 ENCOUNTER — Other Ambulatory Visit: Payer: Self-pay | Admitting: Student

## 2022-11-20 DIAGNOSIS — Z992 Dependence on renal dialysis: Secondary | ICD-10-CM | POA: Diagnosis not present

## 2022-11-20 DIAGNOSIS — N186 End stage renal disease: Secondary | ICD-10-CM | POA: Diagnosis not present

## 2022-11-20 DIAGNOSIS — E1129 Type 2 diabetes mellitus with other diabetic kidney complication: Secondary | ICD-10-CM | POA: Diagnosis not present

## 2022-11-21 ENCOUNTER — Telehealth: Payer: Self-pay | Admitting: Family

## 2022-11-21 ENCOUNTER — Other Ambulatory Visit: Payer: Self-pay

## 2022-11-21 NOTE — Telephone Encounter (Signed)
Patient LVM on nurse line requesting refill on Eliquis. See previous rx refill encounter from 11/18/22.  Veronda Prude, RN

## 2022-11-21 NOTE — Telephone Encounter (Signed)
Pt called requesting refill of oxycodone.Please send to pharmacy on file. Pt phone number is [phone]. 

## 2022-11-22 MED ORDER — OXYCODONE HCL 5 MG PO TABS: 5.0000 mg | ORAL_TABLET | Freq: Three times a day (TID) | ORAL | 0 refills | Status: DC | PRN

## 2022-11-25 ENCOUNTER — Encounter: Payer: Medicaid Other | Admitting: Family

## 2022-11-26 ENCOUNTER — Emergency Department (HOSPITAL_COMMUNITY): Payer: Medicaid Other

## 2022-11-26 ENCOUNTER — Emergency Department (HOSPITAL_COMMUNITY)
Admission: EM | Admit: 2022-11-26 | Discharge: 2022-11-26 | Disposition: A | Discharge status: Home / Self Care | Payer: Medicaid Other | Attending: Emergency Medicine | Admitting: Emergency Medicine

## 2022-11-26 ENCOUNTER — Other Ambulatory Visit: Payer: Self-pay

## 2022-11-26 DIAGNOSIS — Z992 Dependence on renal dialysis: Secondary | ICD-10-CM | POA: Diagnosis not present

## 2022-11-26 DIAGNOSIS — N186 End stage renal disease: Secondary | ICD-10-CM | POA: Insufficient documentation

## 2022-11-26 DIAGNOSIS — E1122 Type 2 diabetes mellitus with diabetic chronic kidney disease: Secondary | ICD-10-CM | POA: Diagnosis not present

## 2022-11-26 DIAGNOSIS — Z7982 Long term (current) use of aspirin: Secondary | ICD-10-CM | POA: Diagnosis not present

## 2022-11-26 DIAGNOSIS — S91302A Unspecified open wound, left foot, initial encounter: Secondary | ICD-10-CM | POA: Diagnosis not present

## 2022-11-26 DIAGNOSIS — I120 Hypertensive chronic kidney disease with stage 5 chronic kidney disease or end stage renal disease: Secondary | ICD-10-CM | POA: Insufficient documentation

## 2022-11-26 DIAGNOSIS — Z7901 Long term (current) use of anticoagulants: Secondary | ICD-10-CM | POA: Insufficient documentation

## 2022-11-26 DIAGNOSIS — Z4801 Encounter for change or removal of surgical wound dressing: Secondary | ICD-10-CM | POA: Diagnosis not present

## 2022-11-26 DIAGNOSIS — T8133XA Disruption of traumatic injury wound repair, initial encounter: Secondary | ICD-10-CM | POA: Insufficient documentation

## 2022-11-26 DIAGNOSIS — Z79899 Other long term (current) drug therapy: Secondary | ICD-10-CM | POA: Diagnosis not present

## 2022-11-26 DIAGNOSIS — M85872 Other specified disorders of bone density and structure, left ankle and foot: Secondary | ICD-10-CM | POA: Diagnosis not present

## 2022-11-26 DIAGNOSIS — Z5189 Encounter for other specified aftercare: Secondary | ICD-10-CM

## 2022-11-26 DIAGNOSIS — S92322D Displaced fracture of second metatarsal bone, left foot, subsequent encounter for fracture with routine healing: Secondary | ICD-10-CM | POA: Diagnosis not present

## 2022-11-26 DIAGNOSIS — Z794 Long term (current) use of insulin: Secondary | ICD-10-CM | POA: Insufficient documentation

## 2022-11-26 LAB — CBC
HCT: 48.2 % (ref 39.0–52.0)
Hemoglobin: 15.7 g/dL (ref 13.0–17.0)
MCH: 26.8 pg (ref 26.0–34.0)
MCHC: 32.6 g/dL (ref 30.0–36.0)
MCV: 82.3 fL (ref 80.0–100.0)
Platelets: 361 10*3/uL (ref 150–400)
RBC: 5.86 MIL/uL — ABNORMAL HIGH (ref 4.22–5.81)
RDW: 18.9 % — ABNORMAL HIGH (ref 11.5–15.5)
WBC: 10.4 10*3/uL (ref 4.0–10.5)
nRBC: 0 % (ref 0.0–0.2)

## 2022-11-26 LAB — BASIC METABOLIC PANEL
Anion gap: 21 — ABNORMAL HIGH (ref 5–15)
BUN: 51 mg/dL — ABNORMAL HIGH (ref 6–20)
CO2: 18 mmol/L — ABNORMAL LOW (ref 22–32)
Calcium: 9.5 mg/dL (ref 8.9–10.3)
Chloride: 96 mmol/L — ABNORMAL LOW (ref 98–111)
Creatinine, Ser: 15.47 mg/dL — ABNORMAL HIGH (ref 0.61–1.24)
GFR, Estimated: 4 mL/min — ABNORMAL LOW (ref >60)
Glucose, Bld: 134 mg/dL — ABNORMAL HIGH (ref 70–99)
Potassium: 4 mmol/L (ref 3.5–5.1)
Sodium: 135 mmol/L (ref 135–145)

## 2022-11-26 MED ORDER — DOXYCYCLINE HYCLATE 100 MG PO CAPS: 100.0000 mg | ORAL_CAPSULE | Freq: Two times a day (BID) | ORAL | 0 refills | Status: AC

## 2022-11-26 MED ORDER — DOXYCYCLINE HYCLATE 100 MG PO TABS
100.0000 mg | ORAL_TABLET | Freq: Once | ORAL | Status: AC
  Administered 2022-11-26: 100 mg via ORAL
  Filled 2022-11-26: qty 1

## 2022-11-26 NOTE — ED Provider Triage Note (Signed)
Emergency Medicine Provider Triage Evaluation Note  Paul [name] , a 32 y.o. male  was evaluated in triage.  Pt complains of wound to the left foot.  He had a partial amputation approximately 1 month ago.  He follows with Ortho care every 2 weeks for wound management.  He had an appointment yesterday but did not go because he was too tired.  He noticed an odd smell to the wound 2 days ago which made him concerned for an infection.  He denies any fevers, chills, nausea, vomiting.  No purulent drainage.  No pain.  Review of Systems  Positive: As above Negative: As above  Physical Exam  BP 120/68 (BP Location: Right Arm)   Pulse 99   Temp 98.5 F (36.9 C) (Oral)   Resp 17   SpO2 100%  Gen:   Awake, no distress   Resp:  Normal effort  MSK:   Moves extremities without difficulty  Other:  Deep open wound to the left midfoot.  No obvious purulent drainage  Medical Decision Making  Medically screening exam initiated at 12:49 PM.  Appropriate orders placed.  Paul [name] was informed that the remainder of the evaluation will be completed by another provider, this initial triage assessment does not replace that evaluation, and the importance of remaining in the ED until their evaluation is complete.  Labs and imaging ordered   Mora Bellman 11/26/22 1250

## 2022-11-26 NOTE — ED Triage Notes (Signed)
Pt. Stated, Im here cause I m thinking my foot has a place on the side and its been treated but it has a smell since yesterday and need to get it checked.

## 2022-11-26 NOTE — Discharge Instructions (Signed)
A new prescription for antibiotics was sent to your pharmacy.  Take as prescribed.  You should hear from the Ortho care office to set up a close outpatient follow-up appointment, likely Monday.  If you do not hear from them tomorrow, call the office to ensure that you are able to get seen within the next couple days.  Return to the emergency department for any new or worsening symptoms of concern.

## 2022-11-26 NOTE — ED Provider Notes (Signed)
[place] EMERGENCY DEPARTMENT AT [facility] Provider Note   CSN: [id number] Arrival date & time: 11/26/22  1119     History  Chief Complaint  Patient presents with   Wound Check   Foot Pain    Paul [name] is a 32 y.o. male.   Wound Check  Foot Pain  Patient presents for wound check.  Medical history includes DM, HTN, HLD, ESRD (HD on Tuesday, Thursday, Saturday).  2 months ago, he underwent left first ray amputation for treatment of osteomyelitis.  He was discharged with a wound VAC.  He has been following up with orthopedic surgery, most recently 2 weeks ago.  At that time, dehiscence was noted.  Patient was instructed to cleanse daily with Dial soap, pack with dry gauze and minimize weightbearing.  At the time, area of dehiscence measured 8 x 3 cm.  Patient states that the wound is not painful.  He does not feel that the appearance of it has worsened.  He did notice a foul smell yesterday.  He has not had any systemic symptoms.  He had an old antibiotic at home that he took this morning due to concern of infection.  He presents to the ED for evaluation of this wound.     Home Medications Prior to Admission medications   Medication Sig Start Date End Date Taking? Authorizing Provider  ciprofloxacin (CIPRO) 500 MG tablet Take 1 tablet (500 mg total) by mouth 2 (two) times daily. 10/03/22   Nadara Mustard, MD  doxycycline (VIBRAMYCIN) 100 MG capsule Take 1 capsule (100 mg total) by mouth 2 (two) times daily for 7 days. 11/26/22 12/03/22 Yes Gloris Manchester, MD  ACCU-CHEK GUIDE test strip USE AS DIRECTED TWICE DAILY 10/27/22   Alfredo Martinez, MD  Accu-Chek Softclix Lancets lancets Use as instructed Patient not taking: Reported on 09/30/2022 06/11/21   Alicia Amel, MD  apixaban Everlene Balls) 5 MG TABS tablet Take 1 tablet by mouth twice daily 11/21/22   Dameron, Nolberto Hanlon, DO  ascorbic acid (VITAMIN C) 1000 MG tablet Take 1 tablet (1,000 mg total) by mouth daily. 10/03/22   Dameron,  Nolberto Hanlon, DO  aspirin EC 81 MG tablet Take 1 tablet (81 mg total) by mouth daily. Swallow whole. 05/11/22   Dameron, Nolberto Hanlon, DO  Blood Glucose Monitoring Suppl (ACCU-CHEK GUIDE ME) w/Device KIT 1 kit by Does not apply route daily. Patient not taking: Reported on 09/30/2022 05/22/21   Alicia Amel, MD  calcium carbonate (TUMS - DOSED IN MG ELEMENTAL CALCIUM) 500 MG chewable tablet Chew 1,000 mg by mouth daily as needed for indigestion or heartburn.    [provider]  cetirizine (ZYRTEC) 10 MG tablet Take 1 tablet (10 mg total) by mouth daily as needed for allergies. 05/11/22   Dameron, Nolberto Hanlon, DO  cinacalcet (SENSIPAR) 30 MG tablet Take 30 mg by mouth daily. 08/12/21   [provider]  ferric citrate (AURYXIA) 1 GM 210 MG(Fe) tablet Take 2-4 tablets (420-840 mg total) by mouth See admin instructions. Take 4 tablets (840 mg) by mouth with each meal & take 2 tablets (420 mg) by mouth with each snack. 05/11/22   Dameron, Nolberto Hanlon, DO  gabapentin (NEURONTIN) 100 MG capsule TAKE 1 CAPSULE BY MOUTH THREE TIMES DAILY 05/09/22   Maeola Harman, MD  gabapentin (NEURONTIN) 300 MG capsule Take 1 capsule by mouth at bedtime 07/25/22   Dameron, Nolberto Hanlon, DO  LANTUS 100 UNIT/ML injection Inject 0.05 mLs (5 Units total) into the skin 2 (  two) times daily. 05/11/22   Dameron, Nolberto Hanlon, DO  levothyroxine (SYNTHROID) 150 MCG tablet TAKE 1 TABLET BY MOUTH ONCE DAILY BEFORE BREAKFAST *APPOINTMENT NEEDED FOR FURTHER REFILLS* Patient taking differently: Take 150 mcg by mouth daily before breakfast. 09/26/22   Dameron, Nolberto Hanlon, DO  midodrine (PROAMATINE) 5 MG tablet Take 1 tablet (5 mg total) by mouth Every Tuesday,Thursday,and Saturday with dialysis. 05/12/22   Dameron, Nolberto Hanlon, DO  multivitamin (RENA-VIT) TABS tablet Take 1 tablet by mouth at bedtime. 05/11/22   Dameron, Nolberto Hanlon, DO  oxyCODONE (OXY IR/ROXICODONE) 5 MG immediate release tablet Take 1 tablet (5 mg total) by mouth every 8 (eight) hours as  needed for severe pain (pain score 4-6). 11/22/22   Adonis Huguenin, NP  polyethylene glycol (MIRALAX / GLYCOLAX) 17 g packet Take 17 g by mouth daily as needed for mild constipation. 10/02/22   Dameron, Nolberto Hanlon, DO  rosuvastatin (CRESTOR) 20 MG tablet Take 1 tablet by mouth once daily 08/23/22   Dameron, Nolberto Hanlon, DO  Syringe, Disposable, (B-D SYRINGE SLIP TIP 30CC) 30 ML MISC 1 Syringe by Does not apply route daily. Patient not taking: Reported on 09/30/2022 01/31/19   Melene Plan, MD  zinc sulfate 220 (50 Zn) MG capsule Take 1 capsule (220 mg total) by mouth daily. 10/03/22   Dameron, Nolberto Hanlon, DO      Allergies    Hydralazine hcl    Review of Systems   Review of Systems  Skin:  Positive for wound.  All other systems reviewed and are negative.   Physical Exam Updated Vital Signs BP 118/79 (BP Location: Right Leg)   Pulse 100   Temp 98.5 F (36.9 C) (Oral)   Resp 18   SpO2 99%  Physical Exam Vitals and nursing note reviewed.  Constitutional:      General: He is not in acute distress.    Appearance: Normal appearance. He is well-developed. He is not ill-appearing, toxic-appearing or diaphoretic.  HENT:     Head: Normocephalic and atraumatic.     Right Ear: External ear normal.     Left Ear: External ear normal.     Nose: Nose normal.     Mouth/Throat:     Mouth: Mucous membranes are moist.  Eyes:     Extraocular Movements: Extraocular movements intact.     Conjunctiva/sclera: Conjunctivae normal.  Cardiovascular:     Rate and Rhythm: Normal rate and regular rhythm.  Pulmonary:     Effort: Pulmonary effort is normal. No respiratory distress.  Abdominal:     General: There is no distension.  Musculoskeletal:        General: No swelling. Normal range of motion.     Cervical back: Normal range of motion and neck supple.     Right lower leg: No edema.     Left lower leg: No edema.  Skin:    General: Skin is warm and dry.     Coloration: Skin is not jaundiced or pale.      Comments: Wound to medial surface of left foot.  Neurological:     General: No focal deficit present.     Mental Status: He is alert and oriented to person, place, and time.  Psychiatric:        Mood and Affect: Mood normal.        Behavior: Behavior normal.        Thought Content: Thought content normal.        Judgment: Judgment normal.     ED Results /  Procedures / Treatments   Labs (all labs ordered are listed, but only abnormal results are displayed) Labs Reviewed  CBC - Abnormal; Notable for the following components:      Result Value   RBC 5.86 (*)    RDW 18.9 (*)    All other components within normal limits  BASIC METABOLIC PANEL - Abnormal; Notable for the following components:   Chloride 96 (*)    CO2 18 (*)    Glucose, Bld 134 (*)    BUN 51 (*)    Creatinine, Ser 15.47 (*)    GFR, Estimated 4 (*)    Anion gap 21 (*)    All other components within normal limits    EKG None  Radiology DG Foot Complete Left  Result Date: 11/26/2022 CLINICAL DATA:  Mid foot wound EXAM: LEFT FOOT - COMPLETE 3+ VIEW COMPARISON:  09/29/2022 FINDINGS: Interval fifth ray resection at the TMT joint level. Chronic healed second metatarsal fracture with robust callus formation. Chronic cortical thickening of the third metatarsal diaphysis. There is osteopenia of the medial cuneiform distally without definite bone destruction. No acute fracture or dislocation. Soft tissue wound at the medial aspect of the forefoot. IMPRESSION: Soft tissue wound at the medial aspect of the forefoot. Osteopenia of the medial cuneiform distally without definite bone destruction. Early acute osteomyelitis is not excluded. Electronically Signed   By: Duanne Guess D.O.   On: 11/26/2022 13:44    Procedures Procedures    Medications Ordered in ED Medications  doxycycline (VIBRA-TABS) tablet 100 mg (has no administration in time range)    ED Course/ Medical Decision Making/ A&P                              Medical Decision Making Amount and/or Complexity of Data Reviewed Labs: ordered.  Risk Prescription drug management.   Patient presents for wound check.  He underwent first ray amputation of the left foot 2 months ago.  Wound has since dehisced.  He noticed a foul smell yesterday.  On arrival in the ED, vital signs are normal.  Patient is well-appearing.  Lab work was obtained prior to patient being bedded in the ED.  No leukocytosis is present.  X-ray imaging showed soft tissue wound with osteopenia of the medial cuneiform.  No definitive bone distraction was noted.  I spoke with orthopedic surgeon on-call, who recommends initiation of doxycycline and close outpatient follow-up.  Patient currently has follow-up with Ortho scheduled for next Friday.  Ortho to arrange for close follow-up, likely Monday.  Patient is comfortable with this plan.  New dressing was applied to wound.  He was discharged in good condition.        Final Clinical Impression(s) / ED Diagnoses Final diagnoses:  Visit for wound check    Rx / DC Orders ED Discharge Orders          Ordered    doxycycline (VIBRAMYCIN) 100 MG capsule  2 times daily        11/26/22 1612              Gloris Manchester, MD 11/26/22 [phone]

## 2022-11-26 NOTE — ED Notes (Signed)
EDP at BS 

## 2022-11-26 NOTE — ED Notes (Signed)
Reports L great toe amputation ~ 70yr ago, 2 months ago removed some bone from L foot wound, L foot wound remains, has been doing wet to dry dressings. Here to get checked out. C/o some pain, but concerned about odor/ drainage.

## 2022-12-01 ENCOUNTER — Other Ambulatory Visit (HOSPITAL_COMMUNITY): Payer: Self-pay | Admitting: Internal Medicine

## 2022-12-01 DIAGNOSIS — N186 End stage renal disease: Secondary | ICD-10-CM

## 2022-12-02 ENCOUNTER — Other Ambulatory Visit: Payer: Self-pay | Admitting: *Deleted

## 2022-12-02 ENCOUNTER — Ambulatory Visit (INDEPENDENT_AMBULATORY_CARE_PROVIDER_SITE_OTHER): Payer: Medicaid Other | Admitting: Family

## 2022-12-02 DIAGNOSIS — I739 Peripheral vascular disease, unspecified: Secondary | ICD-10-CM

## 2022-12-02 DIAGNOSIS — Z89432 Acquired absence of left foot: Secondary | ICD-10-CM

## 2022-12-02 MED ORDER — OXYCODONE HCL 5 MG PO TABS: 5.0000 mg | ORAL_TABLET | Freq: Three times a day (TID) | ORAL | 0 refills | Status: DC | PRN

## 2022-12-02 NOTE — Progress Notes (Signed)
Office Visit Note   Patient: Paul [name]           Date of Birth: [birth year]           MRN: [id number] Visit Date: 12/02/2022              Requested by: Darral Dash, DO [address] [place],  [place] [address] PCP: Darral Dash, DO  Chief Complaint  Patient presents with   Left Foot - Routine Post Op    10/01/2022 left foot 1st ray amputation       HPI: The patient is a 32 year old gentleman who is seen status post left first ray amputation which unfortunately did dehisce.  Surgery was May 11 and he has been packing open with dry gauze and attempting to minimize his weightbearing. Has been keeping a closer eye on his blood sugars, thinks is helping. No fever no chills no concerning drainage  Currently completing a course of doxycycline after an ED visit on 11/26/23 for a wound check. Has also gotten a kneeling scooter and began minimizing his weightbearing  Assessment & Plan: Visit Diagnoses:  No diagnosis found.   Plan: Kerecis micro powder donation applied today.  He will leave the scalpel dressing in place and change the overlying gauze for the next 1 week next week will remove this and resume daily Dial soap cleansing packing open.     Follow-Up Instructions: No follow-ups on file.   Ortho Exam  Patient is alert, oriented, no adenopathy, well-dressed, normal affect, normal respiratory effort.  On examination left lower extremity the medial ray amputation has dehisced this 7.8  cm x 3 cm is 5 mm deep there is no exposed bone or tendon there is scant fibrinous tissue no epiboly scant bloody drainage filled in with granulation well-appearing wound bed.  Debrided of nonviable hyperkeratotic tissue.     There is significant improvement in the wound bed appearance  Imaging: No results found.   Labs: Lab Results  Component Value Date   HGBA1C 7.4 (H) 09/30/2022   HGBA1C 6.3 (H) 04/18/2022   HGBA1C 6.2 09/22/2021   REPTSTATUS 10/06/2022 FINAL 10/01/2022    GRAMSTAIN  10/01/2022    FEW WBC PRESENT, PREDOMINANTLY PMN RARE GRAM POSITIVE COCCI RARE SQUAMOUS EPITHELIAL CELLS PRESENT    CULT  10/01/2022    RARE ESCHERICHIA COLI RARE STENOTROPHOMONAS MALTOPHILIA NO ANAEROBES ISOLATED Performed at [facility], [address] [place], [place] [address]    [facility] ESCHERICHIA COLI 10/01/2022   LABORGA STENOTROPHOMONAS MALTOPHILIA 10/01/2022     Lab Results  Component Value Date   ALBUMIN 3.3 (L) 09/30/2022   ALBUMIN 4.4 09/29/2022   ALBUMIN 3.7 05/05/2022    Lab Results  Component Value Date   MG 2.8 (H) 09/26/2016   MG 2.8 (H) 09/25/2016   MG 2.8 (H) 09/24/2016   Lab Results  Component Value Date   VD25OH 6.8 (L) 09/14/2016    No results found for: "PREALBUMIN"    Latest Ref Rng & Units 11/26/2022   12:44 PM 10/02/2022   12:21 AM 10/01/2022    4:06 PM  CBC EXTENDED  WBC 4.0 - 10.5 K/uL 10.4  10.6  8.5   RBC 4.22 - 5.81 MIL/uL 5.86  5.15  4.66   Hemoglobin 13.0 - 17.0 g/dL 86.5  78.4  69.6   HCT 39.0 - 52.0 % 48.2  41.4  37.9   Platelets 150 - 400 K/uL 361  261  241  There is no height or weight on file to calculate BMI.  Orders:  No orders of the defined types were placed in this encounter.  No orders of the defined types were placed in this encounter.    Procedures: No procedures performed  Clinical Data: No additional findings.  ROS:  All other systems negative, except as noted in the HPI. Review of Systems  Objective: Vital Signs: There were no vitals taken for this visit.  Specialty Comments:  No specialty comments available.  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   Thrombus 05/04/2022   Ischemia of left upper extremity 05/04/2022   Complication of vascular access for dialysis 04/19/2022   ESRD (end stage renal disease) (HCC)  04/19/2022   Failure of hemodialysis access (HCC) 04/18/2022   Left hand pain 09/22/2021   Diabetic foot infection (HCC)    Wound infection 05/07/2021   MSSA bacteremia 05/07/2021   Osteomyelitis (HCC) 05/07/2021   Abscess 05/07/2021   Sepsis due to cellulitis (HCC) 05/06/2021   Poor compliance with medication 03/17/2021   Fluid overload, unspecified 07/09/2020   Complication of vascular dialysis catheter 03/23/2020   Other disorders of phosphorus metabolism 01/28/2020   Hypertension 01/08/2020   Hypocalcemia 10/29/2019   ESRD on dialysis (HCC) 11/16/2017   Pruritus, unspecified 04/04/2017   Unspecified protein-calorie malnutrition (HCC) 11/11/2016   Headache, unspecified 10/29/2016   Encounter for immunization 10/19/2016   Iron deficiency anemia, unspecified 10/10/2016   Anemia in chronic kidney disease 09/30/2016   Coagulation defect, unspecified (HCC) 09/30/2016   Pneumonia due to Pseudomonas (HCC) 09/30/2016   Secondary hyperparathyroidism of renal origin (HCC) 09/30/2016   Hypothyroidism 10/29/2009   Type 2 diabetes mellitus (HCC) 04/14/2009   Hyperlipidemia 04/14/2009   OBESITY, MORBID 04/14/2009   Essential hypertension, benign 04/14/2009   Past Medical History:  Diagnosis Date   Acute hypoxemic respiratory failure (HCC) 09/11/2016   Anemia    Chronic kidney disease    ARF on CRF Dialysis T/TH/Sa   Diabetes mellitus    Type II   End stage renal disease on dialysis (HCC)    [place]    GERD (gastroesophageal reflux disease)    diet controlled   HLD (hyperlipidemia)    Hypertension    Hypothyroidism    Morbid obesity (HCC)    Sacral wound    resolved   Thyroid disease     Family History  Problem Relation Age of Onset   Heart disease Mother    Hypertension Mother     Past Surgical History:  Procedure Laterality Date   AMPUTATION Left 05/08/2021   Procedure: AMPUTATION GREAT TOE;  Surgeon: Nadara Mustard, MD;  Location: [facility] OR;  Service: Orthopedics;   Laterality: Left;   AMPUTATION Left 10/01/2022   Procedure: LEFT FOOT AMPUTATION RAY 1st;  Surgeon: Nadara Mustard, MD;  Location: [facility] OR;  Service: Orthopedics;  Laterality: Left;   AV FISTULA PLACEMENT Left 09/26/2016   Procedure: LEFT UPPER ARM ARTERIOVENOUS (AV) FISTULA CREATION;  Surgeon: Sherren Kerns, MD;  Location: [facility] OR;  Service: Vascular;  Laterality: Left;   AV FISTULA PLACEMENT Left 12/02/2016   Procedure: INSERTION OF ARTERIOVENOUS GORE-TEX GRAFT LEFT UPPER  ARM USING A 4-7MM BY 45CM GRAFT ;  Surgeon: Larina Earthly, MD;  Location: [facility] OR;  Service: Vascular;  Laterality: Left;   AV  FISTULA PLACEMENT Right 07/03/2020   Procedure: INSERTION OF ARTERIOVENOUS (AV) GORE-TEX GRAFT RIGHT UPPER ARM;  Surgeon: Cephus Shelling, MD;  Location: [facility] OR;  Service: Vascular;  Laterality: Right;   AV FISTULA PLACEMENT Left 07/02/2021   Procedure: LEFT UPPER EXTREMITY ARTERIOVENOUS FISTULA CREATION;  Surgeon: Maeola Harman, MD;  Location: [facility] OR;  Service: Vascular;  Laterality: Left;   BASCILIC VEIN TRANSPOSITION Right 05/06/2020   Procedure: FIRST STAGE RIGHT BASCILIC VEIN TRANSPOSITION;  Surgeon: Nada Libman, MD;  Location: [facility] OR;  Service: Vascular;  Laterality: Right;   BASCILIC VEIN TRANSPOSITION Left 08/20/2021   Procedure: LEFT ARM SECOND STAGE WITH ARTERIOVENOUS GRAFT;  Surgeon: Maeola Harman, MD;  Location: [facility] OR;  Service: Vascular;  Laterality: Left;   EXCHANGE OF A DIALYSIS CATHETER Right 10/08/2016   Procedure: EXCHANGE OF A DIALYSIS CATHETER-RIGHT INTERNAL JUGULAR;  Surgeon: Fransisco Hertz, MD;  Location: [facility] OR;  Service: Vascular;  Laterality: Right;   INSERTION OF DIALYSIS CATHETER Right 09/26/2016   Procedure: INSERTION OF DIALYSIS CATHETER - Right Internal Jugular Placement;  Surgeon: Sherren Kerns, MD;  Location: [facility] OR;  Service: Vascular;  Laterality: Right;   INSERTION OF DIALYSIS CATHETER Left 03/22/2018   Procedure: INSERTION OF DIALYSIS CATHETER;   Surgeon: Cephus Shelling, MD;  Location: [facility] OR;  Service: Vascular;  Laterality: Left;   INTRAOPERATIVE ARTERIOGRAM Left 05/04/2022   Procedure: INTRA OPERATIVE ARTERIOGRAM;  Surgeon: Victorino Sparrow, MD;  Location: [facility] OR;  Service: Vascular;  Laterality: Left;   IR AV DIALY SHUNT INTRO NEEDLE/INTRACATH INITIAL W/PTA/IMG LEFT  08/19/2022   IR AV DIALY SHUNT INTRO NEEDLE/INTRACATH INITIAL W/PTA/IMG LEFT  11/14/2022   IR DIALY SHUNT INTRO NEEDLE/INTRACATH INITIAL W/IMG LEFT Left 04/15/2022   IR FLUORO GUIDE CV LINE LEFT  04/19/2022   IR FLUORO GUIDE CV LINE LEFT  05/30/2022   IR FLUORO GUIDE CV LINE RIGHT  03/26/2021   IR FLUORO GUIDE CV LINE RIGHT  05/13/2021   IR FLUORO GUIDE CV LINE RIGHT  05/04/2022   IR REMOVAL TUN CV CATH W/O FL  05/11/2021   IR REMOVAL TUN CV CATH W/O FL  10/06/2021   IR THROMBECTOMY AV FISTULA W/THROMBOLYSIS/PTA INC/SHUNT/IMG LEFT Left 02/02/2022   IR THROMBECTOMY AV FISTULA W/THROMBOLYSIS/PTA INC/SHUNT/IMG LEFT Left 03/22/2022   IR THROMBECTOMY AV FISTULA W/THROMBOLYSIS/PTA INC/SHUNT/IMG LEFT Left 05/04/2022   IR THROMBECTOMY AV FISTULA W/THROMBOLYSIS/PTA/STENT INC/SHUNT/IMG LT Left 04/27/2022   IR US GUIDE VASC ACCESS LEFT  02/02/2022   IR US GUIDE VASC ACCESS LEFT  03/22/2022   IR US GUIDE VASC ACCESS LEFT  04/15/2022   IR US GUIDE VASC ACCESS LEFT  04/19/2022   IR US GUIDE VASC ACCESS LEFT  04/27/2022   IR US GUIDE VASC ACCESS RIGHT  03/26/2021   IR US GUIDE VASC ACCESS RIGHT  05/13/2021   IR US GUIDE VASC ACCESS RIGHT  05/04/2022   REVISON OF ARTERIOVENOUS FISTULA Left 03/22/2018   Procedure: LEFT UPPER EXTREMITY ARTERIOVENOUS  REVISION WITH GORE-TEX GRAFT.;  Surgeon: Cephus Shelling, MD;  Location: [facility] OR;  Service: Vascular;  Laterality: Left;   TEE WITHOUT CARDIOVERSION N/A 05/12/2021   Procedure: TRANSESOPHAGEAL ECHOCARDIOGRAM (TEE);  Surgeon: Christell Constant, MD;  Location: [facility] ENDOSCOPY;  Service: Cardiovascular;  Laterality: N/A;   THROMBECTOMY  Left 08/26/2019   thrombectomy of LUA loop AVG   THROMBECTOMY AND REVISION OF ARTERIOVENTOUS (AV) GORETEX  GRAFT Right 07/27/2020   Procedure: INSERTION OF RIGHT ARM LOOP GRAFT WITH EXCISION OF RIGHT ARM BRACHIAL  AXILLARY GRAFT;  Surgeon: Cephus Shelling, MD;  Location: [facility] OR;  Service: Vascular;  Laterality: Right;   THROMBECTOMY BRACHIAL ARTERY Left 05/04/2022   Procedure: LEFT ARM RADIAL BRACHIAL ARTERY EMBOLECTOMY;  Surgeon: Victorino Sparrow, MD;  Location: [facility] OR;  Service: Vascular;  Laterality: Left;   UPPER EXTREMITY VENOGRAPHY Bilateral 06/14/2021   Procedure: UPPER EXTREMITY VENOGRAPHY;  Surgeon: Maeola Harman, MD;  Location: [facility] INVASIVE CV LAB;  Service: Cardiovascular;  Laterality: Bilateral;   VISCERAL ANGIOGRAPHY Right 06/17/2020   Procedure: CENTRAL VENO;  Surgeon: Leonie Douglas, MD;  Location: [facility] INVASIVE CV LAB;  Service: Cardiovascular;  Laterality: Right;   Social History   Occupational History   Not on file  Tobacco Use   Smoking status: Never   Smokeless tobacco: Never  Vaping Use   Vaping status: Never Used  Substance and Sexual Activity   Alcohol use: No   Drug use: Yes    Frequency: 2.0 times per week    Types: Marijuana    Comment: Last use was on 08/17/21   Sexual activity: Yes

## 2022-12-02 NOTE — Addendum Note (Signed)
Addended by: Barnie Del R on: 12/02/2022 10:42 AM   Modules accepted: Orders

## 2022-12-06 ENCOUNTER — Other Ambulatory Visit: Payer: Self-pay | Admitting: Student

## 2022-12-06 DIAGNOSIS — E1122 Type 2 diabetes mellitus with diabetic chronic kidney disease: Secondary | ICD-10-CM

## 2022-12-07 ENCOUNTER — Other Ambulatory Visit: Payer: Self-pay | Admitting: Student

## 2022-12-07 DIAGNOSIS — E785 Hyperlipidemia, unspecified: Secondary | ICD-10-CM

## 2022-12-08 ENCOUNTER — Ambulatory Visit: Payer: Medicaid Other | Admitting: Orthopedic Surgery

## 2022-12-08 ENCOUNTER — Other Ambulatory Visit (HOSPITAL_COMMUNITY): Payer: Self-pay | Admitting: Student

## 2022-12-08 ENCOUNTER — Encounter: Payer: Self-pay | Admitting: Radiology

## 2022-12-08 ENCOUNTER — Telehealth: Payer: Self-pay | Admitting: Orthopedic Surgery

## 2022-12-08 DIAGNOSIS — L97521 Non-pressure chronic ulcer of other part of left foot limited to breakdown of skin: Secondary | ICD-10-CM

## 2022-12-08 DIAGNOSIS — I739 Peripheral vascular disease, unspecified: Secondary | ICD-10-CM

## 2022-12-08 DIAGNOSIS — Z89432 Acquired absence of left foot: Secondary | ICD-10-CM

## 2022-12-08 NOTE — Telephone Encounter (Signed)
Patient called. He says he is supposed to be seen today. He would like an appointment with Lajoyce Corners or Denny Peon.

## 2022-12-08 NOTE — Telephone Encounter (Signed)
SW pt he is coming in today at 3:30

## 2022-12-08 NOTE — H&P (Signed)
Chief Complaint: Decreased pressures and difficulty with cannulation. Team is requesting a fistulogram with possible intervention. AV Graft-Synthetic - Gore Acuseal, Left Upper Arm, placed on 2.11.22.  Referring Physician(s): Peeples,Samuel J  Supervising Physician: Simonne Come  Patient Status: [facility] - Out-pt  History of Present Illness: Paul [name] is a 31 y.o. male outpatient. History of obesity, hypothyroidism, HTN, HLD, GERD, DM, anemia, ESRD on HD. Patient has a complex line history. Currently receiving dialysis through a AV Graft-Synthetic - Gore Acuseal, Left Upper Arm, placed on 2.11.22. Last IR intervention occurred on 6.24.24 with IR Attending Dr. Mosie Epstein. Per procedure notes. Status post ultrasound guided access left upper extremity fistula,with combination of balloon angioplasty and drug-eluting balloon angioplasty of serial venous outflow stenosis with no residual and excellent flow and palpable thrill on completion. Dialysis Team is reporting a decrease pressures and difficulty cannulating at AVF. Team is requesting a fistulogram with possible intervention.   Patient states that he had to stop his dialysis session early yesterday due to infiltration of the graft.  Patient alert and laying in bed,calm. Denies any fevers, headache, chest pain, SOB, cough, abdominal pain, nausea, vomiting or bleeding. Return precautions and treatment recommendations and follow-up discussed with the patient  who is agreeable with the plan.    Past Medical History:  Diagnosis Date   Acute hypoxemic respiratory failure (HCC) 09/11/2016   Anemia    Chronic kidney disease    ARF on CRF Dialysis T/TH/Sa   Diabetes mellitus    Type II   End stage renal disease on dialysis (HCC)    [place]    GERD (gastroesophageal reflux disease)    diet controlled   HLD (hyperlipidemia)    Hypertension    Hypothyroidism    Morbid obesity (HCC)    Sacral wound    resolved   Thyroid disease      Past Surgical History:  Procedure Laterality Date   AMPUTATION Left 05/08/2021   Procedure: AMPUTATION GREAT TOE;  Surgeon: Nadara Mustard, MD;  Location: [facility] OR;  Service: Orthopedics;  Laterality: Left;   AMPUTATION Left 10/01/2022   Procedure: LEFT FOOT AMPUTATION RAY 1st;  Surgeon: Nadara Mustard, MD;  Location: [facility] OR;  Service: Orthopedics;  Laterality: Left;   AV FISTULA PLACEMENT Left 09/26/2016   Procedure: LEFT UPPER ARM ARTERIOVENOUS (AV) FISTULA CREATION;  Surgeon: Sherren Kerns, MD;  Location: [facility] OR;  Service: Vascular;  Laterality: Left;   AV FISTULA PLACEMENT Left 12/02/2016   Procedure: INSERTION OF ARTERIOVENOUS GORE-TEX GRAFT LEFT UPPER  ARM USING A 4-7MM BY 45CM GRAFT ;  Surgeon: Larina Earthly, MD;  Location: [facility] OR;  Service: Vascular;  Laterality: Left;   AV FISTULA PLACEMENT Right 07/03/2020   Procedure: INSERTION OF ARTERIOVENOUS (AV) GORE-TEX GRAFT RIGHT UPPER ARM;  Surgeon: Cephus Shelling, MD;  Location: [facility] OR;  Service: Vascular;  Laterality: Right;   AV FISTULA PLACEMENT Left 07/02/2021   Procedure: LEFT UPPER EXTREMITY ARTERIOVENOUS FISTULA CREATION;  Surgeon: Maeola Harman, MD;  Location: [facility] OR;  Service: Vascular;  Laterality: Left;   BASCILIC VEIN TRANSPOSITION Right 05/06/2020   Procedure: FIRST STAGE RIGHT BASCILIC VEIN TRANSPOSITION;  Surgeon: Nada Libman, MD;  Location: [facility] OR;  Service: Vascular;  Laterality: Right;   BASCILIC VEIN TRANSPOSITION Left 08/20/2021   Procedure: LEFT ARM SECOND STAGE WITH ARTERIOVENOUS GRAFT;  Surgeon: Maeola Harman, MD;  Location: [facility] OR;  Service: Vascular;  Laterality: Left;   EXCHANGE OF A DIALYSIS CATHETER  Right 10/08/2016   Procedure: EXCHANGE OF A DIALYSIS CATHETER-RIGHT INTERNAL JUGULAR;  Surgeon: Fransisco Hertz, MD;  Location: [facility] OR;  Service: Vascular;  Laterality: Right;   INSERTION OF DIALYSIS CATHETER Right 09/26/2016   Procedure: INSERTION OF DIALYSIS CATHETER - Right Internal Jugular  Placement;  Surgeon: Sherren Kerns, MD;  Location: [facility] OR;  Service: Vascular;  Laterality: Right;   INSERTION OF DIALYSIS CATHETER Left 03/22/2018   Procedure: INSERTION OF DIALYSIS CATHETER;  Surgeon: Cephus Shelling, MD;  Location: [facility] OR;  Service: Vascular;  Laterality: Left;   INTRAOPERATIVE ARTERIOGRAM Left 05/04/2022   Procedure: INTRA OPERATIVE ARTERIOGRAM;  Surgeon: Victorino Sparrow, MD;  Location: [facility] OR;  Service: Vascular;  Laterality: Left;   IR AV DIALY SHUNT INTRO NEEDLE/INTRACATH INITIAL W/PTA/IMG LEFT  08/19/2022   IR AV DIALY SHUNT INTRO NEEDLE/INTRACATH INITIAL W/PTA/IMG LEFT  11/14/2022   IR DIALY SHUNT INTRO NEEDLE/INTRACATH INITIAL W/IMG LEFT Left 04/15/2022   IR FLUORO GUIDE CV LINE LEFT  04/19/2022   IR FLUORO GUIDE CV LINE LEFT  05/30/2022   IR FLUORO GUIDE CV LINE RIGHT  03/26/2021   IR FLUORO GUIDE CV LINE RIGHT  05/13/2021   IR FLUORO GUIDE CV LINE RIGHT  05/04/2022   IR REMOVAL TUN CV CATH W/O FL  05/11/2021   IR REMOVAL TUN CV CATH W/O FL  10/06/2021   IR THROMBECTOMY AV FISTULA W/THROMBOLYSIS/PTA INC/SHUNT/IMG LEFT Left 02/02/2022   IR THROMBECTOMY AV FISTULA W/THROMBOLYSIS/PTA INC/SHUNT/IMG LEFT Left 03/22/2022   IR THROMBECTOMY AV FISTULA W/THROMBOLYSIS/PTA INC/SHUNT/IMG LEFT Left 05/04/2022   IR THROMBECTOMY AV FISTULA W/THROMBOLYSIS/PTA/STENT INC/SHUNT/IMG LT Left 04/27/2022   IR US GUIDE VASC ACCESS LEFT  02/02/2022   IR US GUIDE VASC ACCESS LEFT  03/22/2022   IR US GUIDE VASC ACCESS LEFT  04/15/2022   IR US GUIDE VASC ACCESS LEFT  04/19/2022   IR US GUIDE VASC ACCESS LEFT  04/27/2022   IR US GUIDE VASC ACCESS RIGHT  03/26/2021   IR US GUIDE VASC ACCESS RIGHT  05/13/2021   IR US GUIDE VASC ACCESS RIGHT  05/04/2022   REVISON OF ARTERIOVENOUS FISTULA Left 03/22/2018   Procedure: LEFT UPPER EXTREMITY ARTERIOVENOUS  REVISION WITH GORE-TEX GRAFT.;  Surgeon: Cephus Shelling, MD;  Location: [facility] OR;  Service: Vascular;  Laterality: Left;   TEE WITHOUT  CARDIOVERSION N/A 05/12/2021   Procedure: TRANSESOPHAGEAL ECHOCARDIOGRAM (TEE);  Surgeon: Christell Constant, MD;  Location: [facility] ENDOSCOPY;  Service: Cardiovascular;  Laterality: N/A;   THROMBECTOMY Left 08/26/2019   thrombectomy of LUA loop AVG   THROMBECTOMY AND REVISION OF ARTERIOVENTOUS (AV) GORETEX  GRAFT Right 07/27/2020   Procedure: INSERTION OF RIGHT ARM LOOP GRAFT WITH EXCISION OF RIGHT ARM BRACHIAL AXILLARY GRAFT;  Surgeon: Cephus Shelling, MD;  Location: [facility] OR;  Service: Vascular;  Laterality: Right;   THROMBECTOMY BRACHIAL ARTERY Left 05/04/2022   Procedure: LEFT ARM RADIAL BRACHIAL ARTERY EMBOLECTOMY;  Surgeon: Victorino Sparrow, MD;  Location: [facility] OR;  Service: Vascular;  Laterality: Left;   UPPER EXTREMITY VENOGRAPHY Bilateral 06/14/2021   Procedure: UPPER EXTREMITY VENOGRAPHY;  Surgeon: Maeola Harman, MD;  Location: [facility] INVASIVE CV LAB;  Service: Cardiovascular;  Laterality: Bilateral;   VISCERAL ANGIOGRAPHY Right 06/17/2020   Procedure: CENTRAL VENO;  Surgeon: Leonie Douglas, MD;  Location: [facility] INVASIVE CV LAB;  Service: Cardiovascular;  Laterality: Right;    Allergies: Hydralazine hcl  Medications: Prior to Admission medications   Medication Sig Start Date End Date Taking? Authorizing Provider  ciprofloxacin (CIPRO)  500 MG tablet Take 1 tablet (500 mg total) by mouth 2 (two) times daily. 10/03/22   Nadara Mustard, MD  ACCU-CHEK GUIDE test strip USE AS DIRECTED TWICE DAILY 12/08/22   Darral Dash, DO  Accu-Chek Softclix Lancets Use as instructed Patient not taking: Reported on 09/30/2022 06/11/21   Alicia Amel, MD  apixaban Everlene Balls) 5 MG TABS tablet Take 1 tablet by mouth twice daily 11/21/22   Dameron, Nolberto Hanlon, DO  ascorbic acid (VITAMIN C) 1000 MG tablet Take 1 tablet (1,000 mg total) by mouth daily. 10/03/22   Dameron, Nolberto Hanlon, DO  aspirin EC 81 MG tablet Take 1 tablet (81 mg total) by mouth daily. Swallow whole. 05/11/22   Dameron, Nolberto Hanlon, DO  Blood  Glucose Monitoring Suppl (ACCU-CHEK GUIDE ME) w/Device KIT 1 kit by Does not apply route daily. Patient not taking: Reported on 09/30/2022 05/22/21   Alicia Amel, MD  calcium carbonate (TUMS - DOSED IN MG ELEMENTAL CALCIUM) 500 MG chewable tablet Chew 1,000 mg by mouth daily as needed for indigestion or heartburn.    [provider]  cetirizine (ZYRTEC) 10 MG tablet Take 1 tablet (10 mg total) by mouth daily as needed for allergies. 05/11/22   Dameron, Nolberto Hanlon, DO  cinacalcet (SENSIPAR) 30 MG tablet Take 30 mg by mouth daily. 08/12/21   [provider]  ferric citrate (AURYXIA) 1 GM 210 MG(Fe) tablet Take 2-4 tablets (420-840 mg total) by mouth See admin instructions. Take 4 tablets (840 mg) by mouth with each meal & take 2 tablets (420 mg) by mouth with each snack. 05/11/22   Dameron, Nolberto Hanlon, DO  gabapentin (NEURONTIN) 100 MG capsule TAKE 1 CAPSULE BY MOUTH THREE TIMES DAILY 05/09/22   Maeola Harman, MD  gabapentin (NEURONTIN) 300 MG capsule Take 1 capsule by mouth at bedtime 07/25/22   Dameron, Nolberto Hanlon, DO  LANTUS 100 UNIT/ML injection Inject 0.05 mLs (5 Units total) into the skin 2 (two) times daily. 05/11/22   Dameron, Nolberto Hanlon, DO  levothyroxine (SYNTHROID) 150 MCG tablet TAKE 1 TABLET BY MOUTH ONCE DAILY BEFORE BREAKFAST *APPOINTMENT NEEDED FOR FURTHER REFILLS* Patient taking differently: Take 150 mcg by mouth daily before breakfast. 09/26/22   Dameron, Nolberto Hanlon, DO  midodrine (PROAMATINE) 5 MG tablet Take 1 tablet (5 mg total) by mouth Every Tuesday,Thursday,and Saturday with dialysis. 05/12/22   Dameron, Nolberto Hanlon, DO  multivitamin (RENA-VIT) TABS tablet Take 1 tablet by mouth at bedtime. 05/11/22   Dameron, Nolberto Hanlon, DO  oxyCODONE (OXY IR/ROXICODONE) 5 MG immediate release tablet Take 1 tablet (5 mg total) by mouth every 8 (eight) hours as needed for severe pain (pain score 4-6). 12/02/22   Adonis Huguenin, NP  polyethylene glycol (MIRALAX / GLYCOLAX) 17 g packet Take 17 g by  mouth daily as needed for mild constipation. 10/02/22   Dameron, Nolberto Hanlon, DO  rosuvastatin (CRESTOR) 20 MG tablet Take 1 tablet by mouth once daily 12/08/22   Dameron, Nolberto Hanlon, DO  Syringe, Disposable, (B-D SYRINGE SLIP TIP 30CC) 30 ML MISC 1 Syringe by Does not apply route daily. Patient not taking: Reported on 09/30/2022 01/31/19   Melene Plan, MD  zinc sulfate 220 (50 Zn) MG capsule Take 1 capsule (220 mg total) by mouth daily. 10/03/22   Darral Dash, DO     Family History  Problem Relation Age of Onset   Heart disease Mother    Hypertension Mother     Social History   Socioeconomic History   Marital status: Single    Spouse  name: Not on file   Number of children: Not on file   Years of education: Not on file   Highest education level: Not on file  Occupational History   Not on file  Tobacco Use   Smoking status: Never   Smokeless tobacco: Never  Vaping Use   Vaping status: Never Used  Substance and Sexual Activity   Alcohol use: No   Drug use: Yes    Frequency: 2.0 times per week    Types: Marijuana    Comment: Last use was on 08/17/21   Sexual activity: Yes  Other Topics Concern   Not on file  Social History Narrative   Not on file   Social Determinants of Health   Financial Resource Strain: Not on file  Food Insecurity: No Food Insecurity (09/29/2022)   Hunger Vital Sign    Worried About Running Out of Food in the Last Year: Never true    Ran Out of Food in the Last Year: Never true  Transportation Needs: No Transportation Needs (09/29/2022)   PRAPARE - Administrator, Civil Service (Medical): No    Lack of Transportation (Non-Medical): No  Physical Activity: Insufficiently Active (07/07/2021)   Exercise Vital Sign    Days of Exercise per Week: 4 days    Minutes of Exercise per Session: 30 min  Stress: Stress Concern Present (08/06/2021)   [employer] of Occupational Health - Occupational Stress Questionnaire    Feeling of Stress : To some  extent  Social Connections: Socially Isolated (06/07/2021)   Social Connection and Isolation Panel [NHANES]    Frequency of Communication with Friends and Family: More than three times a week    Frequency of Social Gatherings with Friends and Family: More than three times a week    Attends Religious Services: Never    Database administrator or Organizations: No    Attends Banker Meetings: Never    Marital Status: Never married    Review of Systems: A 12 point ROS discussed and pertinent positives are indicated in the HPI above.  All other systems are negative.  Review of Systems  Constitutional:  Negative for fever.  HENT:  Negative for congestion.   Respiratory:  Negative for cough and shortness of breath.   Cardiovascular:  Negative for chest pain.  Gastrointestinal:  Negative for abdominal pain.  Neurological:  Negative for headaches.  Psychiatric/Behavioral:  Negative for behavioral problems and confusion.     Vital Signs: BP (!) 144/66   Pulse 85   Temp 98.1 F (36.7 C) (Temporal)   Resp 20   Ht 6\' 2"  (1.88 m)   Wt (!) 320 lb (145.2 kg)   SpO2 100%   BMI 41.09 kg/m     Physical Exam Vitals and nursing note reviewed.  Constitutional:      Appearance: He is well-developed.  HENT:     Head: Normocephalic.  Cardiovascular:     Rate and Rhythm: Normal rate and regular rhythm.     Heart sounds: Normal heart sounds.     Comments: LUE graft diminished thrill and bruit.  Pulmonary:     Effort: Pulmonary effort is normal.     Breath sounds: Normal breath sounds.  Musculoskeletal:        General: Normal range of motion.     Cervical back: Normal range of motion.  Skin:    General: Skin is dry.  Neurological:     Mental Status: He is alert and  oriented to person, place, and time.     Imaging: DG Foot Complete Left  Result Date: 11/26/2022 CLINICAL DATA:  Mid foot wound EXAM: LEFT FOOT - COMPLETE 3+ VIEW COMPARISON:  09/29/2022 FINDINGS: Interval  fifth ray resection at the TMT joint level. Chronic healed second metatarsal fracture with robust callus formation. Chronic cortical thickening of the third metatarsal diaphysis. There is osteopenia of the medial cuneiform distally without definite bone destruction. No acute fracture or dislocation. Soft tissue wound at the medial aspect of the forefoot. IMPRESSION: Soft tissue wound at the medial aspect of the forefoot. Osteopenia of the medial cuneiform distally without definite bone destruction. Early acute osteomyelitis is not excluded. Electronically Signed   By: Duanne Guess D.O.   On: 11/26/2022 13:44   IR AV DIALY SHUNT INTRO NEEDLE/INTRACATH INITIAL W/PTA/IMG LEFT  Result Date: 11/14/2022 INDICATION: 32 year old male with a history of clots during dialysis therapy via left upper extremity circuit. EXAM: ULTRASOUND-GUIDED ACCESS LEFT UPPER EXTREMITY FISTULA BALLOON ANGIOPLASTY OF SERIAL VENOUS OUTFLOW MEDICATIONS: 4000 units IV heparin ANESTHESIA/SEDATION: Moderate (conscious) sedation was employed during this procedure. A total of Versed 0.5 mg and Fentanyl 50 mcg was administered intravenously by the radiology nurse. Total intra-service moderate Sedation Time: 31 minutes. The patient's level of consciousness and vital signs were monitored continuously by radiology nursing throughout the procedure under my direct supervision. FLUOROSCOPY: Radiation Exposure Index (as provided by the fluoroscopic device): 183.7 mGy Kerma COMPLICATIONS: None PROCEDURE: Informed written consent was obtained from the patient after a thorough discussion of the procedural risks, benefits and alternatives. All questions were addressed. Maximal Sterile Barrier Technique was utilized including caps, mask, sterile gowns, sterile gloves, sterile drape, hand hygiene and skin antiseptic. A timeout was performed prior to the initiation of the procedure. Ultrasound survey was performed with images stored and sent to PACs. The left  upper extremity was prepped and draped in the usual sterile fashion. Ultrasound guidance was used to access the left upper extremity graft with a micropuncture kit. Once the 3 French sheath was within the graft, left upper extremity fistulogram was performed. Images were carried out through the central vasculature. We elected to treat serial outflow stenoses. A Bentson wire was passed through the micro puncture. Micro puncture was removed and a 6 [place] sheath was placed. Repeat angiogram was performed. Standard 8 mm x 40 mm balloon angioplasty was then performed of serial outflow stenosis including the most proximal stent margin of the brachial vein, in stent stenosis of stent within the brachial vein, and distal stent margin stenosis of the brachial vein. Each of these were treated 8 mm balloon angioplasty with resolution of stenosis. We then elected to treat this outflow stenosis with drug-eluting balloon angioplasty. Drug-eluting balloon angioplasty was then performed at the proximal stent margin stenosis, observing 3 minute inflation time. Balloon was removed and a second drug-eluting balloon was placed at the distal stent margin. 3 minute time interval was observed. During this inflation time a reflux image of the arterial anastomosis was performed. A final balloon angioplasty was then performed, 7 mm x 40 mm in the mid segment of the venous outflow, at a site of chronic cannulation. Final angiogram was performed after this final angioplasty. We confirmed excellent thrill at the arterial anastomosis. All catheters and wires were removed. A Z-stitch was performed at the site of the puncture for assistance with hemostasis and the catheter was removed. Patient tolerated the procedure well and remained hemodynamically stable throughout. No complications were encountered and no  significant blood loss was encountered. FINDINGS: Initial angiogram demonstrates 80-90% stenosis of the proximal stent margin in the  brachial vein, 80-90% stenosis of the distal stent margin in the brachial vein, and 60-70% in stent stenosis of the brachial vein stent. Additionally there is 60-70% stenosis in the mid segment of the venous outflow at a site of chronic cannulation. All of these were treated 2 no significant stenosis with a combination of balloon angioplasty and drug-eluting balloon angioplasty as above. Questionable 50% stenosis of arterial inflow, which is better assessed with duplex evaluation. IMPRESSION: Status post ultrasound guided access left upper extremity fistula, with combination of balloon angioplasty and drug-eluting balloon angioplasty of serial venous outflow stenosis with no residual and excellent flow and palpable thrill on completion. Signed, Yvone Neu. Miachel Roux, RPVI [facility] Radiology ACCESS: Duplex exam is recommended to evaluate a questionable stenosis in the proximal venous outflow. Electronically Signed   By: Gilmer Mor D.O.   On: 11/14/2022 16:04    Labs:  CBC: Recent Labs    10/01/22 0026 10/01/22 1606 10/02/22 0021 11/26/22 1244  WBC 8.1 8.5 10.6* 10.4  HGB 13.3 12.3* 13.4 15.7  HCT 40.5 37.9* 41.4 48.2  PLT 242 241 261 361    COAGS: Recent Labs    09/29/22 1250  INR 1.8*  APTT 37*    BMP: Recent Labs    09/29/22 1219 09/30/22 0033 10/01/22 0026 11/26/22 1244  NA 133* 135 134* 135  K 3.8 3.6 3.6 4.0  CL 93* 93* 94* 96*  CO2 20* 24 22 18*  GLUCOSE 159* 187* 182* 134*  BUN 28* 40* 57* 51*  CALCIUM 9.6 9.0 8.1* 9.5  CREATININE 11.31* 12.87* 15.17* 15.47*  GFRNONAA 6* 5* 4* 4*    LIVER FUNCTION TESTS: Recent Labs    04/18/22 1014 04/19/22 0901 04/20/22 0608 05/05/22 0508 09/29/22 1219 09/30/22 0033  BILITOT 0.7 0.7  --   --  0.8  --   AST 12* 15  --   --  34  --   ALT 13 13  --   --  23  --   ALKPHOS 51 51  --   --  72  --   PROT 7.6 7.4  --   --  9.2*  --   ALBUMIN 4.1 3.9 4.2 3.7 4.4 3.3*       Assessment and Plan:  32 y.o. male outpatient. History of obesity, hypothyroidism, HTN, HLD, GERD, DM, anemia, ESRD on HD. Patient has a complex line history. Currently receiving dialysis through a AV Graft-Synthetic - Gore Acuseal, Left Upper Arm, placed on 2.11.22. Last IR intervention occurred on 6.24.24 with IR Attending Dr. Mosie Epstein. Per procedure notes. Status post ultrasound guided access left upper extremity fistula,with combination of balloon angioplasty and drug-eluting balloon angioplasty of serial venous outflow stenosis with no residual and excellent flow and palpable thrill on completion. Dialysis Team is reporting a decrease pressures and difficulty cannulating at AVF. Team is requesting a fistulogram with possible intervention.   Per note from IR Attending Dr. Bryn Gulling dated 11.29.23  Tunneled left external jugular vein hemodialysis catheter is ready for use (28 cm). 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 Right external jugular vein is patent, however does not communicate with central venous structures in a pattern which would allow for catheter placement. All labs and medications are within acceptable parameters. No pertinent allergies. Patient has been NPO since midnight.   Risks and benefits discussed with the  patient including, but not limited to bleeding, infection, vascular injury, pulmonary embolism, need for tunneled HD catheter placement or even death.  All of the patient's questions were answered, patient is agreeable to proceed. Consent signed and in chart.   Thank you for this interesting consult.  I greatly enjoyed meeting Paul [name] and look forward to participating in their care.  A copy of this report was sent to the requesting provider on this date.  Electronically Signed: Alene Mires, NP 12/09/2022, 11:04 AM   I spent a total of  30 Minutes   in face to face in clinical consultation, greater than 50% of which was counseling/coordinating  care for fistulogram with possible intervention

## 2022-12-09 ENCOUNTER — Other Ambulatory Visit (HOSPITAL_COMMUNITY): Payer: Self-pay | Admitting: Internal Medicine

## 2022-12-09 ENCOUNTER — Other Ambulatory Visit: Payer: Self-pay | Admitting: Orthopedic Surgery

## 2022-12-09 ENCOUNTER — Other Ambulatory Visit: Payer: Self-pay

## 2022-12-09 ENCOUNTER — Telehealth: Payer: Self-pay | Admitting: Orthopedic Surgery

## 2022-12-09 ENCOUNTER — Ambulatory Visit (HOSPITAL_COMMUNITY)
Admission: RE | Admit: 2022-12-09 | Discharge: 2022-12-09 | Disposition: A | Discharge status: Home / Self Care | Payer: Medicaid Other | Source: Ambulatory Visit | Attending: Internal Medicine | Admitting: Internal Medicine

## 2022-12-09 DIAGNOSIS — Y841 Kidney dialysis as the cause of abnormal reaction of the patient, or of later complication, without mention of misadventure at the time of the procedure: Secondary | ICD-10-CM | POA: Insufficient documentation

## 2022-12-09 DIAGNOSIS — N186 End stage renal disease: Secondary | ICD-10-CM | POA: Insufficient documentation

## 2022-12-09 DIAGNOSIS — Z992 Dependence on renal dialysis: Secondary | ICD-10-CM | POA: Diagnosis not present

## 2022-12-09 DIAGNOSIS — E1122 Type 2 diabetes mellitus with diabetic chronic kidney disease: Secondary | ICD-10-CM | POA: Diagnosis not present

## 2022-12-09 DIAGNOSIS — E785 Hyperlipidemia, unspecified: Secondary | ICD-10-CM | POA: Diagnosis not present

## 2022-12-09 DIAGNOSIS — Z794 Long term (current) use of insulin: Secondary | ICD-10-CM | POA: Insufficient documentation

## 2022-12-09 DIAGNOSIS — E039 Hypothyroidism, unspecified: Secondary | ICD-10-CM | POA: Diagnosis not present

## 2022-12-09 DIAGNOSIS — Z6841 Body Mass Index (BMI) 40.0 and over, adult: Secondary | ICD-10-CM | POA: Insufficient documentation

## 2022-12-09 DIAGNOSIS — I120 Hypertensive chronic kidney disease with stage 5 chronic kidney disease or end stage renal disease: Secondary | ICD-10-CM | POA: Insufficient documentation

## 2022-12-09 DIAGNOSIS — T82868A Thrombosis of vascular prosthetic devices, implants and grafts, initial encounter: Secondary | ICD-10-CM | POA: Insufficient documentation

## 2022-12-09 DIAGNOSIS — K219 Gastro-esophageal reflux disease without esophagitis: Secondary | ICD-10-CM | POA: Diagnosis not present

## 2022-12-09 HISTORY — PX: IR THROMBECTOMY AV FISTULA W/THROMBOLYSIS/PTA INC/SHUNT/IMG LEFT: IMG6106

## 2022-12-09 LAB — GLUCOSE, CAPILLARY: Glucose-Capillary: 133 mg/dL — ABNORMAL HIGH (ref 70–99)

## 2022-12-09 MED ORDER — SODIUM CHLORIDE 0.9 % IV SOLN: INTRAVENOUS | Status: DC

## 2022-12-09 MED ORDER — FENTANYL CITRATE (PF) 100 MCG/2ML IJ SOLN
INTRAMUSCULAR | Status: AC | PRN
  Administered 2022-12-09 (×2): 25 ug via INTRAVENOUS

## 2022-12-09 MED ORDER — MIDAZOLAM HCL 2 MG/2ML IJ SOLN
INTRAMUSCULAR | Status: AC | PRN
  Administered 2022-12-09: .5 mg via INTRAVENOUS
  Administered 2022-12-09: 1 mg via INTRAVENOUS

## 2022-12-09 MED ORDER — ALTEPLASE 2 MG IJ SOLR
INTRAMUSCULAR | Status: AC | PRN
  Administered 2022-12-09: 4 mg

## 2022-12-09 MED ORDER — IOHEXOL 300 MG/ML  SOLN
100.0000 mL | Freq: Once | INTRAMUSCULAR | Status: AC | PRN
  Administered 2022-12-09: 50 mL via INTRAVENOUS

## 2022-12-09 MED ORDER — HEPARIN SODIUM (PORCINE) 1000 UNIT/ML IJ SOLN
INTRAMUSCULAR | Status: AC | PRN
  Administered 2022-12-09: 4000 [IU] via INTRAVENOUS

## 2022-12-09 MED ORDER — OXYCODONE HCL 5 MG PO TABS: 5.0000 mg | ORAL_TABLET | Freq: Three times a day (TID) | ORAL | 0 refills | Status: DC | PRN

## 2022-12-09 NOTE — Telephone Encounter (Signed)
Pt's last refill was on 12/02/22 #21 of oxycodone. He was here for an appt yesterday great toe/ray amputation f/u

## 2022-12-09 NOTE — Procedures (Signed)
Pre procedural Dx: ESRD, clotted dialysis access. Post procedural Dx: Same.   Technically successful declot of left upper arm dialysis graft.   Access is ready for immediate use.  EBL: Trace  Complications: None immediate   Katherina Right, MD Pager #: [phone]

## 2022-12-09 NOTE — Telephone Encounter (Signed)
Pt is leaving hospital today from procedure and was supposed to get Oxycodone refill sent to [facility] on fine please advise

## 2022-12-13 ENCOUNTER — Other Ambulatory Visit (HOSPITAL_COMMUNITY): Payer: Self-pay | Admitting: Internal Medicine

## 2022-12-13 ENCOUNTER — Encounter (HOSPITAL_COMMUNITY): Payer: Self-pay

## 2022-12-13 DIAGNOSIS — N186 End stage renal disease: Secondary | ICD-10-CM

## 2022-12-13 HISTORY — PX: IR US GUIDE VASC ACCESS LEFT: IMG2389

## 2022-12-15 ENCOUNTER — Encounter: Payer: Medicaid Other | Admitting: Orthopedic Surgery

## 2022-12-15 ENCOUNTER — Other Ambulatory Visit: Payer: Self-pay | Admitting: *Deleted

## 2022-12-15 DIAGNOSIS — N186 End stage renal disease: Secondary | ICD-10-CM

## 2022-12-16 ENCOUNTER — Encounter: Payer: Medicaid Other | Admitting: Family

## 2022-12-21 DIAGNOSIS — Z992 Dependence on renal dialysis: Secondary | ICD-10-CM | POA: Diagnosis not present

## 2022-12-21 DIAGNOSIS — E1129 Type 2 diabetes mellitus with other diabetic kidney complication: Secondary | ICD-10-CM | POA: Diagnosis not present

## 2022-12-21 DIAGNOSIS — N186 End stage renal disease: Secondary | ICD-10-CM | POA: Diagnosis not present

## 2022-12-21 NOTE — Progress Notes (Deleted)
Office Note    CC:  *** Requesting Provider:  Darral Dash, DO  HPI: Paul [name] is a 32 y.o. (05-Nov-1990) male presenting at the request of .Dameron, Nolberto Hanlon, DO ***  The pt is *** on a statin for cholesterol management.  The pt is *** on a daily aspirin.   Other AC:  *** The pt is *** on medication for hypertension.   The pt is *** diabetic.  Tobacco hx:  ***  Past Medical History:  Diagnosis Date   Acute hypoxemic respiratory failure (HCC) 09/11/2016   Anemia    Chronic kidney disease    ARF on CRF Dialysis T/TH/Sa   Diabetes mellitus    Type II   End stage renal disease on dialysis (HCC)    [place]     GERD (gastroesophageal reflux disease)    diet controlled   HLD (hyperlipidemia)    Hypertension    Hypothyroidism    Morbid obesity (HCC)    Sacral wound    resolved   Thyroid disease     Past Surgical History:  Procedure Laterality Date   AMPUTATION Left 05/08/2021   Procedure: AMPUTATION GREAT TOE;  Surgeon: Nadara Mustard, MD;  Location: [facility] OR;  Service: Orthopedics;  Laterality: Left;   AMPUTATION Left 10/01/2022   Procedure: LEFT FOOT AMPUTATION RAY 1st;  Surgeon: Nadara Mustard, MD;  Location: [facility] OR;  Service: Orthopedics;  Laterality: Left;   AV FISTULA PLACEMENT Left 09/26/2016   Procedure: LEFT UPPER ARM ARTERIOVENOUS (AV) FISTULA CREATION;  Surgeon: Sherren Kerns, MD;  Location: [facility] OR;  Service: Vascular;  Laterality: Left;   AV FISTULA PLACEMENT Left 12/02/2016   Procedure: INSERTION OF ARTERIOVENOUS GORE-TEX GRAFT LEFT UPPER  ARM USING A 4-7MM BY 45CM GRAFT ;  Surgeon: Larina Earthly, MD;  Location: [facility] OR;  Service: Vascular;  Laterality: Left;   AV FISTULA PLACEMENT Right 07/03/2020   Procedure: INSERTION OF ARTERIOVENOUS (AV) GORE-TEX GRAFT RIGHT UPPER ARM;  Surgeon: Cephus Shelling, MD;  Location: [facility] OR;  Service: Vascular;  Laterality: Right;   AV FISTULA PLACEMENT Left 07/02/2021   Procedure: LEFT UPPER EXTREMITY ARTERIOVENOUS FISTULA  CREATION;  Surgeon: Maeola Harman, MD;  Location: [facility] OR;  Service: Vascular;  Laterality: Left;   BASCILIC VEIN TRANSPOSITION Right 05/06/2020   Procedure: FIRST STAGE RIGHT BASCILIC VEIN TRANSPOSITION;  Surgeon: Nada Libman, MD;  Location: [facility] OR;  Service: Vascular;  Laterality: Right;   BASCILIC VEIN TRANSPOSITION Left 08/20/2021   Procedure: LEFT ARM SECOND STAGE WITH ARTERIOVENOUS GRAFT;  Surgeon: Maeola Harman, MD;  Location: [facility] OR;  Service: Vascular;  Laterality: Left;   EXCHANGE OF A DIALYSIS CATHETER Right 10/08/2016   Procedure: EXCHANGE OF A DIALYSIS CATHETER-RIGHT INTERNAL JUGULAR;  Surgeon: Fransisco Hertz, MD;  Location: [facility] OR;  Service: Vascular;  Laterality: Right;   INSERTION OF DIALYSIS CATHETER Right 09/26/2016   Procedure: INSERTION OF DIALYSIS CATHETER - Right Internal Jugular Placement;  Surgeon: Sherren Kerns, MD;  Location: [facility] OR;  Service: Vascular;  Laterality: Right;   INSERTION OF DIALYSIS CATHETER Left 03/22/2018   Procedure: INSERTION OF DIALYSIS CATHETER;  Surgeon: Cephus Shelling, MD;  Location: [facility] OR;  Service: Vascular;  Laterality: Left;   INTRAOPERATIVE ARTERIOGRAM Left 05/04/2022   Procedure: INTRA OPERATIVE ARTERIOGRAM;  Surgeon: Victorino Sparrow, MD;  Location: [facility] OR;  Service: Vascular;  Laterality: Left;   IR AV DIALY SHUNT INTRO NEEDLE/INTRACATH INITIAL W/PTA/IMG LEFT  08/19/2022   IR AV DIALY  SHUNT INTRO NEEDLE/INTRACATH INITIAL W/PTA/IMG LEFT  11/14/2022   IR DIALY SHUNT INTRO NEEDLE/INTRACATH INITIAL W/IMG LEFT Left 04/15/2022   IR FLUORO GUIDE CV LINE LEFT  04/19/2022   IR FLUORO GUIDE CV LINE LEFT  05/30/2022   IR FLUORO GUIDE CV LINE RIGHT  03/26/2021   IR FLUORO GUIDE CV LINE RIGHT  05/13/2021   IR FLUORO GUIDE CV LINE RIGHT  05/04/2022   IR REMOVAL TUN CV CATH W/O FL  05/11/2021   IR REMOVAL TUN CV CATH W/O FL  10/06/2021   IR THROMBECTOMY AV FISTULA W/THROMBOLYSIS/PTA INC/SHUNT/IMG LEFT Left 02/02/2022   IR  THROMBECTOMY AV FISTULA W/THROMBOLYSIS/PTA INC/SHUNT/IMG LEFT Left 03/22/2022   IR THROMBECTOMY AV FISTULA W/THROMBOLYSIS/PTA INC/SHUNT/IMG LEFT Left 05/04/2022   IR THROMBECTOMY AV FISTULA W/THROMBOLYSIS/PTA INC/SHUNT/IMG LEFT Left 12/09/2022   IR THROMBECTOMY AV FISTULA W/THROMBOLYSIS/PTA/STENT INC/SHUNT/IMG LT Left 04/27/2022   IR US GUIDE VASC ACCESS LEFT  02/02/2022   IR US GUIDE VASC ACCESS LEFT  03/22/2022   IR US GUIDE VASC ACCESS LEFT  04/15/2022   IR US GUIDE VASC ACCESS LEFT  04/19/2022   IR US GUIDE VASC ACCESS LEFT  04/27/2022   IR US GUIDE VASC ACCESS LEFT  12/13/2022   IR US GUIDE VASC ACCESS RIGHT  03/26/2021   IR US GUIDE VASC ACCESS RIGHT  05/13/2021   IR US GUIDE VASC ACCESS RIGHT  05/04/2022   REVISON OF ARTERIOVENOUS FISTULA Left 03/22/2018   Procedure: LEFT UPPER EXTREMITY ARTERIOVENOUS  REVISION WITH GORE-TEX GRAFT.;  Surgeon: Cephus Shelling, MD;  Location: [facility] OR;  Service: Vascular;  Laterality: Left;   TEE WITHOUT CARDIOVERSION N/A 05/12/2021   Procedure: TRANSESOPHAGEAL ECHOCARDIOGRAM (TEE);  Surgeon: Christell Constant, MD;  Location: [facility] ENDOSCOPY;  Service: Cardiovascular;  Laterality: N/A;   THROMBECTOMY Left 08/26/2019   thrombectomy of LUA loop AVG   THROMBECTOMY AND REVISION OF ARTERIOVENTOUS (AV) GORETEX  GRAFT Right 07/27/2020   Procedure: INSERTION OF RIGHT ARM LOOP GRAFT WITH EXCISION OF RIGHT ARM BRACHIAL AXILLARY GRAFT;  Surgeon: Cephus Shelling, MD;  Location: [facility] OR;  Service: Vascular;  Laterality: Right;   THROMBECTOMY BRACHIAL ARTERY Left 05/04/2022   Procedure: LEFT ARM RADIAL BRACHIAL ARTERY EMBOLECTOMY;  Surgeon: Victorino Sparrow, MD;  Location: [facility] OR;  Service: Vascular;  Laterality: Left;   UPPER EXTREMITY VENOGRAPHY Bilateral 06/14/2021   Procedure: UPPER EXTREMITY VENOGRAPHY;  Surgeon: Maeola Harman, MD;  Location: [facility] INVASIVE CV LAB;  Service: Cardiovascular;  Laterality: Bilateral;   VISCERAL ANGIOGRAPHY Right 06/17/2020    Procedure: CENTRAL VENO;  Surgeon: Leonie Douglas, MD;  Location: [facility] INVASIVE CV LAB;  Service: Cardiovascular;  Laterality: Right;    Social History   Socioeconomic History   Marital status: Single    Spouse name: Not on file   Number of children: Not on file   Years of education: Not on file   Highest education level: Not on file  Occupational History   Not on file  Tobacco Use   Smoking status: Never   Smokeless tobacco: Never  Vaping Use   Vaping status: Never Used  Substance and Sexual Activity   Alcohol use: No   Drug use: Yes    Frequency: 2.0 times per week    Types: Marijuana    Comment: Last use was on 08/17/21   Sexual activity: Yes  Other Topics Concern   Not on file  Social History Narrative   Not on file   Social Determinants of Health   Financial Resource  Strain: Not on file  Food Insecurity: No Food Insecurity (09/29/2022)   Hunger Vital Sign    Worried About Running Out of Food in the Last Year: Never true    Ran Out of Food in the Last Year: Never true  Transportation Needs: No Transportation Needs (09/29/2022)   PRAPARE - Administrator, Civil Service (Medical): No    Lack of Transportation (Non-Medical): No  Physical Activity: Insufficiently Active (07/07/2021)   Exercise Vital Sign    Days of Exercise per Week: 4 days    Minutes of Exercise per Session: 30 min  Stress: Stress Concern Present (08/06/2021)   [employer] of Occupational Health - Occupational Stress Questionnaire    Feeling of Stress : To some extent  Social Connections: Socially Isolated (06/07/2021)   Social Connection and Isolation Panel [NHANES]    Frequency of Communication with Friends and Family: More than three times a week    Frequency of Social Gatherings with Friends and Family: More than three times a week    Attends Religious Services: Never    Database administrator or Organizations: No    Attends Banker Meetings: Never    Marital Status:  Never married  Intimate Partner Violence: Not At Risk (09/29/2022)   Humiliation, Afraid, Rape, and Kick questionnaire    Fear of Current or Ex-Partner: No    Emotionally Abused: No    Physically Abused: No    Sexually Abused: No   *** Family History  Problem Relation Age of Onset   Heart disease Mother    Hypertension Mother     Current Outpatient Medications  Medication Sig Dispense Refill   ACCU-CHEK GUIDE test strip USE AS DIRECTED TWICE DAILY 100 each 0   Accu-Chek Softclix Lancets lancets Use as instructed (Patient not taking: Reported on 09/30/2022) 100 each 12   apixaban (ELIQUIS) 5 MG TABS tablet Take 1 tablet by mouth twice daily 60 tablet 5   ascorbic acid (VITAMIN C) 1000 MG tablet Take 1 tablet (1,000 mg total) by mouth daily. 30 tablet 0   aspirin EC 81 MG tablet Take 1 tablet (81 mg total) by mouth daily. Swallow whole. 90 tablet 0   Blood Glucose Monitoring Suppl (ACCU-CHEK GUIDE ME) w/Device KIT 1 kit by Does not apply route daily. (Patient not taking: Reported on 09/30/2022) 1 kit 0   calcium carbonate (TUMS - DOSED IN MG ELEMENTAL CALCIUM) 500 MG chewable tablet Chew 1,000 mg by mouth daily as needed for indigestion or heartburn.     cetirizine (ZYRTEC) 10 MG tablet Take 1 tablet (10 mg total) by mouth daily as needed for allergies. 30 tablet 0   cinacalcet (SENSIPAR) 30 MG tablet Take 30 mg by mouth daily.     ciprofloxacin (CIPRO) 500 MG tablet Take 1 tablet (500 mg total) by mouth 2 (two) times daily. 20 tablet 0   ferric citrate (AURYXIA) 1 GM 210 MG(Fe) tablet Take 2-4 tablets (420-840 mg total) by mouth See admin instructions. Take 4 tablets (840 mg) by mouth with each meal & take 2 tablets (420 mg) by mouth with each snack. 270 tablet 0   gabapentin (NEURONTIN) 100 MG capsule TAKE 1 CAPSULE BY MOUTH THREE TIMES DAILY 90 capsule 0   gabapentin (NEURONTIN) 300 MG capsule Take 1 capsule by mouth at bedtime 30 capsule 0   LANTUS 100 UNIT/ML injection Inject 0.05 mLs (5  Units total) into the skin 2 (two) times daily. 10 mL 3  levothyroxine (SYNTHROID) 150 MCG tablet TAKE 1 TABLET BY MOUTH ONCE DAILY BEFORE BREAKFAST *APPOINTMENT NEEDED FOR FURTHER REFILLS* (Patient taking differently: Take 150 mcg by mouth daily before breakfast.) 15 tablet 0   midodrine (PROAMATINE) 5 MG tablet Take 1 tablet (5 mg total) by mouth Every Tuesday,Thursday,and Saturday with dialysis. 30 tablet 3   multivitamin (RENA-VIT) TABS tablet Take 1 tablet by mouth at bedtime. 30 tablet 0   oxyCODONE (OXY IR/ROXICODONE) 5 MG immediate release tablet Take 1 tablet (5 mg total) by mouth every 8 (eight) hours as needed for severe pain (pain score 4-6). 21 tablet 0   polyethylene glycol (MIRALAX / GLYCOLAX) 17 g packet Take 17 g by mouth daily as needed for mild constipation. 14 each 0   rosuvastatin (CRESTOR) 20 MG tablet Take 1 tablet by mouth once daily 90 tablet 0   Syringe, Disposable, (B-D SYRINGE SLIP TIP 30CC) 30 ML MISC 1 Syringe by Does not apply route daily. (Patient not taking: Reported on 09/30/2022) 30 each 11   zinc sulfate 220 (50 Zn) MG capsule Take 1 capsule (220 mg total) by mouth daily. 14 capsule 0   No current facility-administered medications for this visit.    Allergies  Allergen Reactions   Hydralazine Hcl Other (See Comments)    DRUG-INDUCED LUPUS     REVIEW OF SYSTEMS:  *** [X]  denotes positive finding, [ ]  denotes negative finding Cardiac  Comments:  Chest pain or chest pressure:    Shortness of breath upon exertion:    Short of breath when lying flat:    Irregular heart rhythm:        Vascular    Pain in calf, thigh, or hip brought on by ambulation:    Pain in feet at night that wakes you up from your sleep:     Blood clot in your veins:    Leg swelling:         Pulmonary    Oxygen at home:    Productive cough:     Wheezing:         Neurologic    Sudden weakness in arms or legs:     Sudden numbness in arms or legs:     Sudden onset of  difficulty speaking or slurred speech:    Temporary loss of vision in one eye:     Problems with dizziness:         Gastrointestinal    Blood in stool:     Vomited blood:         Genitourinary    Burning when urinating:     Blood in urine:        Psychiatric    Major depression:         Hematologic    Bleeding problems:    Problems with blood clotting too easily:        Skin    Rashes or ulcers:        Constitutional    Fever or chills:      PHYSICAL EXAMINATION:  There were no vitals filed for this visit.  General:  WDWN in NAD; vital signs documented above Gait: Not observed HENT: WNL, normocephalic Pulmonary: normal non-labored breathing , without wheezing Cardiac: {Desc; regular/irreg:14544} HR Abdomen: soft, NT, no masses Skin: {With/Without:20273} rashes Vascular Exam/Pulses:  Right Left  Radial {Exam; arterial pulse strength 0-4:30167} {Exam; arterial pulse strength 0-4:30167}  Ulnar {Exam; arterial pulse strength 0-4:30167} {Exam; arterial pulse strength 0-4:30167}  Femoral {Exam; arterial pulse strength  0-4:30167} {Exam; arterial pulse strength 0-4:30167}  Popliteal {Exam; arterial pulse strength 0-4:30167} {Exam; arterial pulse strength 0-4:30167}  DP {Exam; arterial pulse strength 0-4:30167} {Exam; arterial pulse strength 0-4:30167}  PT {Exam; arterial pulse strength 0-4:30167} {Exam; arterial pulse strength 0-4:30167}   Extremities: {With/Without:20273} ischemic changes, {With/Without:20273} Gangrene , {With/Without:20273} cellulitis; {With/Without:20273} open wounds;  Musculoskeletal: no muscle wasting or atrophy  Neurologic: A&O X 3;  No focal weakness or paresthesias are detected Psychiatric:  The pt has {Desc; normal/abnormal:11317::"Normal"} affect.   Non-Invasive Vascular Imaging:   ***    ASSESSMENT/PLAN: Paul [name] is a 32 y.o. male presenting with ***   ***   Victorino Sparrow, MD [facility] [phone]

## 2022-12-22 ENCOUNTER — Encounter: Payer: Medicaid Other | Admitting: Orthopedic Surgery

## 2022-12-23 ENCOUNTER — Ambulatory Visit (HOSPITAL_COMMUNITY): Payer: Medicaid Other

## 2022-12-23 ENCOUNTER — Encounter: Payer: Medicaid Other | Admitting: Vascular Surgery

## 2022-12-25 ENCOUNTER — Encounter: Payer: Self-pay | Admitting: Orthopedic Surgery

## 2022-12-25 NOTE — Progress Notes (Signed)
Office Visit Note   Patient: Paul [name]           Date of Birth: 03/20/1991           MRN: [id number] Visit Date: 12/08/2022              Requested by: Darral Dash, DO [address] [place],  [place] [address] PCP: Darral Dash, DO  Chief Complaint  Patient presents with   Left Foot - Routine Post Op    10/01/2022 left foot 1st ray amputation       HPI: Patient is a 32 year old gentleman who is 2 months status post left foot first ray amputation.  Donated Kerecis graft was applied at the last visit.  Assessment & Plan: Visit Diagnoses:  1. 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 Peripheral arterial disease (HCC)   3. Non-pressure chronic ulcer of other part of left foot limited to breakdown of skin (HCC)     Plan: Continue with Dial soap cleansing routine dressing changes.  Follow-Up Instructions: No follow-ups on file.   Ortho Exam  Patient is alert, oriented, no adenopathy, well-dressed, normal affect, normal respiratory effort. Examination the wound bed measures 6 x 2.5 cm.  There is improved granulation tissue the wound is flat.  Imaging: No results found.   Labs: Lab Results  Component Value Date   HGBA1C 7.4 (H) 09/30/2022   HGBA1C 6.3 (H) 04/18/2022   HGBA1C 6.2 09/22/2021   REPTSTATUS 10/06/2022 FINAL 10/01/2022   GRAMSTAIN  10/01/2022    FEW WBC PRESENT, PREDOMINANTLY PMN RARE GRAM POSITIVE COCCI RARE SQUAMOUS EPITHELIAL CELLS PRESENT    CULT  10/01/2022    RARE ESCHERICHIA COLI RARE STENOTROPHOMONAS MALTOPHILIA NO ANAEROBES ISOLATED Performed at [facility], [address] [place], [place] [address]    [facility] ESCHERICHIA COLI 10/01/2022   LABORGA STENOTROPHOMONAS MALTOPHILIA 10/01/2022     Lab Results  Component Value Date   ALBUMIN 3.3 (L) 09/30/2022   ALBUMIN 4.4 09/29/2022   ALBUMIN 3.7 05/05/2022    Lab Results  Component Value Date   MG 2.8 (H) 09/26/2016   MG 2.8 (H) 09/25/2016   MG 2.8 (H)  09/24/2016   Lab Results  Component Value Date   VD25OH 6.8 (L) 09/14/2016    No results found for: "PREALBUMIN"    Latest Ref Rng & Units 11/26/2022   12:44 PM 10/02/2022   12:21 AM 10/01/2022    4:06 PM  CBC EXTENDED  WBC 4.0 - 10.5 K/uL 10.4  10.6  8.5   RBC 4.22 - 5.81 MIL/uL 5.86  5.15  4.66   Hemoglobin 13.0 - 17.0 g/dL 82.9  56.2  13.0   HCT 39.0 - 52.0 % 48.2  41.4  37.9   Platelets 150 - 400 K/uL 361  261  241      There is no height or weight on file to calculate BMI.  Orders:  No orders of the defined types were placed in this encounter.  No orders of the defined types were placed in this encounter.    Procedures: No procedures performed  Clinical Data: No additional findings.  ROS:  All other systems negative, except as noted in the HPI. Review of Systems  Objective: Vital Signs: There were no vitals taken for this visit.  Specialty Comments:  No specialty comments available.  PMFS History: Patient Active Problem List   Diagnosis Date Noted   Acute osteomyelitis of left foot (HCC) 09/30/2022  Sepsis (HCC) 09/29/2022   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   Thrombus 05/04/2022   Ischemia of left upper extremity 05/04/2022   Complication of vascular access for dialysis 04/19/2022   ESRD (end stage renal disease) (HCC) 04/19/2022   Failure of hemodialysis access (HCC) 04/18/2022   Left hand pain 09/22/2021   Diabetic foot infection (HCC)    Wound infection 05/07/2021   MSSA bacteremia 05/07/2021   Osteomyelitis (HCC) 05/07/2021   Abscess 05/07/2021   Sepsis due to cellulitis (HCC) 05/06/2021   Poor compliance with medication 03/17/2021   Fluid overload, unspecified 07/09/2020   Complication of vascular dialysis catheter 03/23/2020   Other disorders of phosphorus metabolism 01/28/2020   Hypertension 01/08/2020   Hypocalcemia 10/29/2019   ESRD on dialysis (HCC) 11/16/2017   Pruritus, unspecified 04/04/2017   Unspecified  protein-calorie malnutrition (HCC) 11/11/2016   Headache, unspecified 10/29/2016   Encounter for immunization 10/19/2016   Iron deficiency anemia, unspecified 10/10/2016   Anemia in chronic kidney disease 09/30/2016   Coagulation defect, unspecified (HCC) 09/30/2016   Pneumonia due to Pseudomonas (HCC) 09/30/2016   Secondary hyperparathyroidism of renal origin (HCC) 09/30/2016   Hypothyroidism 10/29/2009   Type 2 diabetes mellitus (HCC) 04/14/2009   Hyperlipidemia 04/14/2009   OBESITY, MORBID 04/14/2009   Essential hypertension, benign 04/14/2009   Past Medical History:  Diagnosis Date   Acute hypoxemic respiratory failure (HCC) 09/11/2016   Anemia    Chronic kidney disease    ARF on CRF Dialysis T/TH/Sa   Diabetes mellitus    Type II   End stage renal disease on dialysis (HCC)    [place]    GERD (gastroesophageal reflux disease)    diet controlled   HLD (hyperlipidemia)    Hypertension    Hypothyroidism    Morbid obesity (HCC)    Sacral wound    resolved   Thyroid disease     Family History  Problem Relation Age of Onset   Heart disease Mother    Hypertension Mother     Past Surgical History:  Procedure Laterality Date   AMPUTATION Left 05/08/2021   Procedure: AMPUTATION GREAT TOE;  Surgeon: Nadara Mustard, MD;  Location: [facility] OR;  Service: Orthopedics;  Laterality: Left;   AMPUTATION Left 10/01/2022   Procedure: LEFT FOOT AMPUTATION RAY 1st;  Surgeon: Nadara Mustard, MD;  Location: [facility] OR;  Service: Orthopedics;  Laterality: Left;   AV FISTULA PLACEMENT Left 09/26/2016   Procedure: LEFT UPPER ARM ARTERIOVENOUS (AV) FISTULA CREATION;  Surgeon: Sherren Kerns, MD;  Location: [facility] OR;  Service: Vascular;  Laterality: Left;   AV FISTULA PLACEMENT Left 12/02/2016   Procedure: INSERTION OF ARTERIOVENOUS GORE-TEX GRAFT LEFT UPPER  ARM USING A 4-7MM BY 45CM GRAFT ;  Surgeon: Larina Earthly, MD;  Location: [facility] OR;  Service: Vascular;  Laterality: Left;   AV FISTULA PLACEMENT  Right 07/03/2020   Procedure: INSERTION OF ARTERIOVENOUS (AV) GORE-TEX GRAFT RIGHT UPPER ARM;  Surgeon: Cephus Shelling, MD;  Location: [facility] OR;  Service: Vascular;  Laterality: Right;   AV FISTULA PLACEMENT Left 07/02/2021   Procedure: LEFT UPPER EXTREMITY ARTERIOVENOUS FISTULA CREATION;  Surgeon: Maeola Harman, MD;  Location: [facility] OR;  Service: Vascular;  Laterality: Left;   BASCILIC VEIN TRANSPOSITION Right 05/06/2020   Procedure: FIRST STAGE RIGHT BASCILIC VEIN TRANSPOSITION;  Surgeon: Nada Libman, MD;  Location: [facility] OR;  Service: Vascular;  Laterality: Right;   BASCILIC VEIN TRANSPOSITION Left 08/20/2021   Procedure: LEFT ARM SECOND  STAGE WITH ARTERIOVENOUS GRAFT;  Surgeon: Maeola Harman, MD;  Location: [facility] OR;  Service: Vascular;  Laterality: Left;   EXCHANGE OF A DIALYSIS CATHETER Right 10/08/2016   Procedure: EXCHANGE OF A DIALYSIS CATHETER-RIGHT INTERNAL JUGULAR;  Surgeon: Fransisco Hertz, MD;  Location: [facility] OR;  Service: Vascular;  Laterality: Right;   INSERTION OF DIALYSIS CATHETER Right 09/26/2016   Procedure: INSERTION OF DIALYSIS CATHETER - Right Internal Jugular Placement;  Surgeon: Sherren Kerns, MD;  Location: [facility] OR;  Service: Vascular;  Laterality: Right;   INSERTION OF DIALYSIS CATHETER Left 03/22/2018   Procedure: INSERTION OF DIALYSIS CATHETER;  Surgeon: Cephus Shelling, MD;  Location: [facility] OR;  Service: Vascular;  Laterality: Left;   INTRAOPERATIVE ARTERIOGRAM Left 05/04/2022   Procedure: INTRA OPERATIVE ARTERIOGRAM;  Surgeon: Victorino Sparrow, MD;  Location: [facility] OR;  Service: Vascular;  Laterality: Left;   IR AV DIALY SHUNT INTRO NEEDLE/INTRACATH INITIAL W/PTA/IMG LEFT  08/19/2022   IR AV DIALY SHUNT INTRO NEEDLE/INTRACATH INITIAL W/PTA/IMG LEFT  11/14/2022   IR DIALY SHUNT INTRO NEEDLE/INTRACATH INITIAL W/IMG LEFT Left 04/15/2022   IR FLUORO GUIDE CV LINE LEFT  04/19/2022   IR FLUORO GUIDE CV LINE LEFT  05/30/2022   IR FLUORO GUIDE CV LINE RIGHT   03/26/2021   IR FLUORO GUIDE CV LINE RIGHT  05/13/2021   IR FLUORO GUIDE CV LINE RIGHT  05/04/2022   IR REMOVAL TUN CV CATH W/O FL  05/11/2021   IR REMOVAL TUN CV CATH W/O FL  10/06/2021   IR THROMBECTOMY AV FISTULA W/THROMBOLYSIS/PTA INC/SHUNT/IMG LEFT Left 02/02/2022   IR THROMBECTOMY AV FISTULA W/THROMBOLYSIS/PTA INC/SHUNT/IMG LEFT Left 03/22/2022   IR THROMBECTOMY AV FISTULA W/THROMBOLYSIS/PTA INC/SHUNT/IMG LEFT Left 05/04/2022   IR THROMBECTOMY AV FISTULA W/THROMBOLYSIS/PTA INC/SHUNT/IMG LEFT Left 12/09/2022   IR THROMBECTOMY AV FISTULA W/THROMBOLYSIS/PTA/STENT INC/SHUNT/IMG LT Left 04/27/2022   IR US GUIDE VASC ACCESS LEFT  02/02/2022   IR US GUIDE VASC ACCESS LEFT  03/22/2022   IR US GUIDE VASC ACCESS LEFT  04/15/2022   IR US GUIDE VASC ACCESS LEFT  04/19/2022   IR US GUIDE VASC ACCESS LEFT  04/27/2022   IR US GUIDE VASC ACCESS LEFT  12/13/2022   IR US GUIDE VASC ACCESS RIGHT  03/26/2021   IR US GUIDE VASC ACCESS RIGHT  05/13/2021   IR US GUIDE VASC ACCESS RIGHT  05/04/2022   REVISON OF ARTERIOVENOUS FISTULA Left 03/22/2018   Procedure: LEFT UPPER EXTREMITY ARTERIOVENOUS  REVISION WITH GORE-TEX GRAFT.;  Surgeon: Cephus Shelling, MD;  Location: [facility] OR;  Service: Vascular;  Laterality: Left;   TEE WITHOUT CARDIOVERSION N/A 05/12/2021   Procedure: TRANSESOPHAGEAL ECHOCARDIOGRAM (TEE);  Surgeon: Christell Constant, MD;  Location: [facility] ENDOSCOPY;  Service: Cardiovascular;  Laterality: N/A;   THROMBECTOMY Left 08/26/2019   thrombectomy of LUA loop AVG   THROMBECTOMY AND REVISION OF ARTERIOVENTOUS (AV) GORETEX  GRAFT Right 07/27/2020   Procedure: INSERTION OF RIGHT ARM LOOP GRAFT WITH EXCISION OF RIGHT ARM BRACHIAL AXILLARY GRAFT;  Surgeon: Cephus Shelling, MD;  Location[facility] OR;  Service: Vascular;  Laterality: Right;   THROMBECTOMY BRACHIAL ARTERY Left 05/04/2022   Procedure: LEFT ARM RADIAL BRACHIAL ARTERY EMBOLECTOMY;  Surgeon: Victorino Sparrow, MD;  Location: [facility] OR;  Service:  Vascular;  Laterality: Left;   UPPER EXTREMITY VENOGRAPHY Bilateral 06/14/2021   Procedure: UPPER EXTREMITY VENOGRAPHY;  Surgeon: Maeola Harman, MD;  Location: [facility] INVASIVE CV LAB;  Service: Cardiovascular;  Laterality: Bilateral;   VISCERAL ANGIOGRAPHY Right 06/17/2020  Procedure: CENTRAL VENO;  Surgeon: Leonie Douglas, MD;  Location: [facility] INVASIVE CV LAB;  Service: Cardiovascular;  Laterality: Right;   Social History   Occupational History   Not on file  Tobacco Use   Smoking status: Never   Smokeless tobacco: Never  Vaping Use   Vaping status: Never Used  Substance and Sexual Activity   Alcohol use: No   Drug use: Yes    Frequency: 2.0 times per week    Types: Marijuana    Comment: Last use was on 08/17/21   Sexual activity: Yes

## 2022-12-26 ENCOUNTER — Ambulatory Visit (INDEPENDENT_AMBULATORY_CARE_PROVIDER_SITE_OTHER): Payer: Medicaid Other | Admitting: Orthopedic Surgery

## 2022-12-26 DIAGNOSIS — Z89432 Acquired absence of left foot: Secondary | ICD-10-CM

## 2022-12-27 ENCOUNTER — Other Ambulatory Visit: Payer: Self-pay | Admitting: *Deleted

## 2022-12-27 ENCOUNTER — Other Ambulatory Visit: Payer: Self-pay | Admitting: Orthopedic Surgery

## 2022-12-27 ENCOUNTER — Telehealth: Payer: Self-pay | Admitting: Orthopedic Surgery

## 2022-12-27 DIAGNOSIS — N186 End stage renal disease: Secondary | ICD-10-CM

## 2022-12-27 DIAGNOSIS — I739 Peripheral vascular disease, unspecified: Secondary | ICD-10-CM

## 2022-12-27 MED ORDER — OXYCODONE HCL 5 MG PO TABS: 5.0000 mg | ORAL_TABLET | Freq: Three times a day (TID) | ORAL | 0 refills | Status: DC | PRN

## 2022-12-27 NOTE — Progress Notes (Signed)
US orders placed.

## 2022-12-27 NOTE — Telephone Encounter (Signed)
Patient called request a refill for Oxycodone.CB#[phone]

## 2022-12-27 NOTE — Telephone Encounter (Signed)
This pt is s/p a left 1st ray amputation on 10/01/2022. He is asking for a refill of his Oxycodone 5 mg last refill was on 12/09/2022 #21 please advise.

## 2022-12-28 ENCOUNTER — Encounter: Payer: Self-pay | Admitting: Orthopedic Surgery

## 2022-12-28 NOTE — Progress Notes (Signed)
Office Visit Note   Patient: Paul [name]           Date of Birth: [birth year]           MRN: [id number] Visit Date: 12/26/2022              Requested by: Darral Dash, DO [address] [place],  [place] [address] PCP: Darral Dash, DO  Chief Complaint  Patient presents with   Left Foot - Routine Post Op    10/01/2022 left foot 1st ray amputation       HPI: Patient is a 32 year old gentleman who is 2-1/2 months status post left foot first ray amputation.  Assessment & Plan: Visit Diagnoses:  1. 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  Follow-Up Instructions: Return in about 2 weeks (around 01/09/2023).   Ortho Exam  Patient is alert, oriented, no adenopathy, well-dressed, normal affect, normal respiratory effort. Examination patient has a wound that is 2 x 5 cm with 100% healthy granulation tissue continue routine wound care he has hypertrophic callus that was pared with good bleeding.  Nails were trimmed x 9.  Imaging: No results found. No images are attached to the encounter.  Labs: Lab Results  Component Value Date   HGBA1C 7.4 (H) 09/30/2022   HGBA1C 6.3 (H) 04/18/2022   HGBA1C 6.2 09/22/2021   REPTSTATUS 10/06/2022 FINAL 10/01/2022   GRAMSTAIN  10/01/2022    FEW WBC PRESENT, PREDOMINANTLY PMN RARE GRAM POSITIVE COCCI RARE SQUAMOUS EPITHELIAL CELLS PRESENT    CULT  10/01/2022    RARE ESCHERICHIA COLI RARE STENOTROPHOMONAS MALTOPHILIA NO ANAEROBES ISOLATED Performed at [facility], [address], [place], [place] [address]    [facility] ESCHERICHIA COLI 10/01/2022   LABORGA STENOTROPHOMONAS MALTOPHILIA 10/01/2022     Lab Results  Component Value Date   ALBUMIN 3.3 (L) 09/30/2022   ALBUMIN 4.4 09/29/2022   ALBUMIN 3.7 05/05/2022    Lab Results  Component Value Date   MG 2.8 (H) 09/26/2016   MG 2.8 (H) 09/25/2016   MG 2.8 (H) 09/24/2016   Lab Results  Component Value Date    VD25OH 6.8 (L) 09/14/2016    No results found for: "PREALBUMIN"    Latest Ref Rng & Units 11/26/2022   12:44 PM 10/02/2022   12:21 AM 10/01/2022    4:06 PM  CBC EXTENDED  WBC 4.0 - 10.5 K/uL 10.4  10.6  8.5   RBC 4.22 - 5.81 MIL/uL 5.86  5.15  4.66   Hemoglobin 13.0 - 17.0 g/dL 32.4  40.1  02.7   HCT 39.0 - 52.0 % 48.2  41.4  37.9   Platelets 150 - 400 K/uL 361  261  241      There is no height or weight on file to calculate BMI.  Orders:  No orders of the defined types were placed in this encounter.  No orders of the defined types were placed in this encounter.    Procedures: No procedures performed  Clinical Data: No additional findings.  ROS:  All other systems negative, except as noted in the HPI. Review of Systems  Objective: Vital Signs: There were no vitals taken for this visit.  Specialty Comments:  No specialty comments available.  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  Thrombus 05/04/2022   Ischemia of left upper extremity 05/04/2022   Complication of vascular access for dialysis 04/19/2022   ESRD (end stage renal disease) (HCC) 04/19/2022   Failure of hemodialysis access (HCC) 04/18/2022   Left hand pain 09/22/2021   Diabetic foot infection (HCC)    Wound infection 05/07/2021   MSSA bacteremia 05/07/2021   Osteomyelitis (HCC) 05/07/2021   Abscess 05/07/2021   Sepsis due to cellulitis (HCC) 05/06/2021   Poor compliance with medication 03/17/2021   Fluid overload, unspecified 07/09/2020   Complication of vascular dialysis catheter 03/23/2020   Other disorders of phosphorus metabolism 01/28/2020   Hypertension 01/08/2020   Hypocalcemia 10/29/2019   ESRD on dialysis (HCC) 11/16/2017   Pruritus, unspecified 04/04/2017   Unspecified protein-calorie malnutrition (HCC) 11/11/2016   Headache,  unspecified 10/29/2016   Encounter for immunization 10/19/2016   Iron deficiency anemia, unspecified 10/10/2016   Anemia in chronic kidney disease 09/30/2016   Coagulation defect, unspecified (HCC) 09/30/2016   Pneumonia due to Pseudomonas (HCC) 09/30/2016   Secondary hyperparathyroidism of renal origin (HCC) 09/30/2016   Hypothyroidism 10/29/2009   Type 2 diabetes mellitus (HCC) 04/14/2009   Hyperlipidemia 04/14/2009   OBESITY, MORBID 04/14/2009   Essential hypertension, benign 04/14/2009   Past Medical History:  Diagnosis Date   Acute hypoxemic respiratory failure (HCC) 09/11/2016   Anemia    Chronic kidney disease    ARF on CRF Dialysis T/TH/Sa   Diabetes mellitus    Type II   End stage renal disease on dialysis (HCC)    [place]    GERD (gastroesophageal reflux disease)    diet controlled   HLD (hyperlipidemia)    Hypertension    Hypothyroidism    Morbid obesity (HCC)    Sacral wound    resolved   Thyroid disease     Family History  Problem Relation Age of Onset   Heart disease Mother    Hypertension Mother     Past Surgical History:  Procedure Laterality Date   AMPUTATION Left 05/08/2021   Procedure: AMPUTATION GREAT TOE;  Surgeon: Nadara Mustard, MD;  Location: [facility] OR;  Service: Orthopedics;  Laterality: Left;   AMPUTATION Left 10/01/2022   Procedure: LEFT FOOT AMPUTATION RAY 1st;  Surgeon: Nadara Mustard, MD;  Location: [facility] OR;  Service: Orthopedics;  Laterality: Left;   AV FISTULA PLACEMENT Left 09/26/2016   Procedure: LEFT UPPER ARM ARTERIOVENOUS (AV) FISTULA CREATION;  Surgeon: Sherren Kerns, MD;  Location: [facility] OR;  Service: Vascular;  Laterality: Left;   AV FISTULA PLACEMENT Left 12/02/2016   Procedure: INSERTION OF ARTERIOVENOUS GORE-TEX GRAFT LEFT UPPER  ARM USING A 4-7MM BY 45CM GRAFT ;  Surgeon: Larina Earthly, MD;  Location: [facility] OR;  Service: Vascular;  Laterality: Left;   AV FISTULA PLACEMENT Right 07/03/2020   Procedure: INSERTION OF ARTERIOVENOUS  (AV) GORE-TEX GRAFT RIGHT UPPER ARM;  Surgeon: Cephus Shelling, MD;  Location: [facility] OR;  Service: Vascular;  Laterality: Right;   AV FISTULA PLACEMENT Left 07/02/2021   Procedure: LEFT UPPER EXTREMITY ARTERIOVENOUS FISTULA CREATION;  Surgeon: Maeola Harman, MD;  Location: [facility] OR;  Service: Vascular;  Laterality: Left;   BASCILIC VEIN TRANSPOSITION Right 05/06/2020   Procedure: FIRST STAGE RIGHT BASCILIC VEIN TRANSPOSITION;  Surgeon: Nada Libman, MD;  Location: [facility] OR;  Service: Vascular;  Laterality: Right;   BASCILIC VEIN TRANSPOSITION Left 08/20/2021   Procedure: LEFT ARM SECOND STAGE WITH ARTERIOVENOUS GRAFT;  Surgeon: Maeola Harman, MD;  Location: [facility] OR;  Service: Vascular;  Laterality: Left;   EXCHANGE OF A DIALYSIS CATHETER Right 10/08/2016   Procedure: EXCHANGE OF A DIALYSIS CATHETER-RIGHT INTERNAL JUGULAR;  Surgeon: Fransisco Hertz, MD;  Location: [facility] OR;  Service: Vascular;  Laterality: Right;   INSERTION OF DIALYSIS CATHETER Right 09/26/2016   Procedure: INSERTION OF DIALYSIS CATHETER - Right Internal Jugular Placement;  Surgeon: Sherren Kerns, MD;  Location: [facility] OR;  Service: Vascular;  Laterality: Right;   INSERTION OF DIALYSIS CATHETER Left 03/22/2018   Procedure: INSERTION OF DIALYSIS CATHETER;  Surgeon: Cephus Shelling, MD;  Location: [facility] OR;  Service: Vascular;  Laterality: Left;   INTRAOPERATIVE ARTERIOGRAM Left 05/04/2022   Procedure: INTRA OPERATIVE ARTERIOGRAM;  Surgeon: Victorino Sparrow, MD;  Location: [facility] OR;  Service: Vascular;  Laterality: Left;   IR AV DIALY SHUNT INTRO NEEDLE/INTRACATH INITIAL W/PTA/IMG LEFT  08/19/2022   IR AV DIALY SHUNT INTRO NEEDLE/INTRACATH INITIAL W/PTA/IMG LEFT  11/14/2022   IR DIALY SHUNT INTRO NEEDLE/INTRACATH INITIAL W/IMG LEFT Left 04/15/2022   IR FLUORO GUIDE CV LINE LEFT  04/19/2022   IR FLUORO GUIDE CV LINE LEFT  05/30/2022   IR FLUORO GUIDE CV LINE RIGHT  03/26/2021   IR FLUORO GUIDE CV LINE RIGHT  05/13/2021   IR  FLUORO GUIDE CV LINE RIGHT  05/04/2022   IR REMOVAL TUN CV CATH W/O FL  05/11/2021   IR REMOVAL TUN CV CATH W/O FL  10/06/2021   IR THROMBECTOMY AV FISTULA W/THROMBOLYSIS/PTA INC/SHUNT/IMG LEFT Left 02/02/2022   IR THROMBECTOMY AV FISTULA W/THROMBOLYSIS/PTA INC/SHUNT/IMG LEFT Left 03/22/2022   IR THROMBECTOMY AV FISTULA W/THROMBOLYSIS/PTA INC/SHUNT/IMG LEFT Left 05/04/2022   IR THROMBECTOMY AV FISTULA W/THROMBOLYSIS/PTA INC/SHUNT/IMG LEFT Left 12/09/2022   IR THROMBECTOMY AV FISTULA W/THROMBOLYSIS/PTA/STENT INC/SHUNT/IMG LT Left 04/27/2022   IR US GUIDE VASC ACCESS LEFT  02/02/2022   IR US GUIDE VASC ACCESS LEFT  03/22/2022   IR US GUIDE VASC ACCESS LEFT  04/15/2022   IR US GUIDE VASC ACCESS LEFT  04/19/2022   IR US GUIDE VASC ACCESS LEFT  04/27/2022   IR US GUIDE VASC ACCESS LEFT  12/13/2022   IR US GUIDE VASC ACCESS RIGHT  03/26/2021   IR US GUIDE VASC ACCESS RIGHT  05/13/2021   IR US GUIDE VASC ACCESS RIGHT  05/04/2022   REVISON OF ARTERIOVENOUS FISTULA Left 03/22/2018   Procedure: LEFT UPPER EXTREMITY ARTERIOVENOUS  REVISION WITH GORE-TEX GRAFT.;  Surgeon: Cephus Shelling, MD;  Location: [facility] OR;  Service: Vascular;  Laterality: Left;   TEE WITHOUT CARDIOVERSION N/A 05/12/2021   Procedure: TRANSESOPHAGEAL ECHOCARDIOGRAM (TEE);  Surgeon: Christell Constant, MD;  Location: [facility] ENDOSCOPY;  Service: Cardiovascular;  Laterality: N/A;   THROMBECTOMY Left 08/26/2019   thrombectomy of LUA loop AVG   THROMBECTOMY AND REVISION OF ARTERIOVENTOUS (AV) GORETEX  GRAFT Right 07/27/2020   Procedure: INSERTION OF RIGHT ARM LOOP GRAFT WITH EXCISION OF RIGHT ARM BRACHIAL AXILLARY GRAFT;  Surgeon: Cephus Shelling, MD;  Location: [facility] OR;  Service: Vascular;  Laterality: Right;   THROMBECTOMY BRACHIAL ARTERY Left 05/04/2022   Procedure: LEFT ARM RADIAL BRACHIAL ARTERY EMBOLECTOMY;  Surgeon: Victorino Sparrow, MD;  Location: [facility] OR;  Service: Vascular;  Laterality: Left;   UPPER EXTREMITY VENOGRAPHY Bilateral  06/14/2021   Procedure: UPPER EXTREMITY VENOGRAPHY;  Surgeon: Maeola Harman, MD;  Location: [facility] INVASIVE CV LAB;  Service: Cardiovascular;  Laterality: Bilateral;   VISCERAL ANGIOGRAPHY Right 06/17/2020   Procedure: CENTRAL VENO;  Surgeon: Leonie Douglas, MD;  Location: [facility] INVASIVE CV  LAB;  Service: Cardiovascular;  Laterality: Right;   Social History   Occupational History   Not on file  Tobacco Use   Smoking status: Never   Smokeless tobacco: Never  Vaping Use   Vaping status: Never Used  Substance and Sexual Activity   Alcohol use: No   Drug use: Yes    Frequency: 2.0 times per week    Types: Marijuana    Comment: Last use was on 08/17/21   Sexual activity: Yes

## 2022-12-29 ENCOUNTER — Telehealth: Payer: Self-pay

## 2022-12-29 NOTE — Telephone Encounter (Signed)
..   Medicaid Managed Care   Unsuccessful Outreach Note  12/29/2022 Name: Lew Sharaf MRN: [id number] DOB: [birth year]  Referred by: Darral Dash, DO Reason for referral : Appointment   An unsuccessful telephone outreach was attempted today. The patient was referred to the case management team for assistance with care management and care coordination.   Follow Up Plan: The care management team will reach out to the patient again over the next 7 days.   Weston Settle Care Guide  [facility] Managed  Care Guide [facility]  [phone]

## 2023-01-04 ENCOUNTER — Telehealth: Payer: Self-pay | Admitting: Vascular Surgery

## 2023-01-05 ENCOUNTER — Ambulatory Visit (HOSPITAL_COMMUNITY): Payer: Medicaid Other

## 2023-01-05 ENCOUNTER — Ambulatory Visit: Payer: Medicaid Other

## 2023-01-09 ENCOUNTER — Telehealth: Payer: Self-pay | Admitting: Orthopedic Surgery

## 2023-01-09 NOTE — Telephone Encounter (Signed)
Pt is s/p a left foot 1st ray amp 10/01/2022. Asking for refill of Oxycodone 5 mg last refill 12/27/2022 #21 please advise.

## 2023-01-09 NOTE — Telephone Encounter (Signed)
Patient called and ask for a refill on the Oxycodone for pain. [id number]#[phone]

## 2023-01-09 NOTE — Telephone Encounter (Signed)
I called and sw pt to advise of message below. He said that it was not hurting badly that he just took them once in a while but it was ok he was not having any signs of infection and will call if anything changes.

## 2023-01-11 ENCOUNTER — Ambulatory Visit (HOSPITAL_COMMUNITY): Payer: Medicaid Other

## 2023-01-13 ENCOUNTER — Ambulatory Visit: Payer: Medicaid Other

## 2023-01-15 ENCOUNTER — Other Ambulatory Visit: Payer: Self-pay | Admitting: Student

## 2023-01-15 DIAGNOSIS — E1129 Type 2 diabetes mellitus with other diabetic kidney complication: Secondary | ICD-10-CM

## 2023-01-18 ENCOUNTER — Telehealth: Payer: Self-pay

## 2023-01-18 ENCOUNTER — Telehealth: Payer: Self-pay | Admitting: Vascular Surgery

## 2023-01-18 ENCOUNTER — Encounter: Payer: Self-pay | Admitting: Family

## 2023-01-18 ENCOUNTER — Ambulatory Visit (INDEPENDENT_AMBULATORY_CARE_PROVIDER_SITE_OTHER): Payer: Medicaid Other | Admitting: Family

## 2023-01-18 DIAGNOSIS — Z89432 Acquired absence of left foot: Secondary | ICD-10-CM

## 2023-01-18 DIAGNOSIS — I739 Peripheral vascular disease, unspecified: Secondary | ICD-10-CM

## 2023-01-18 NOTE — Telephone Encounter (Signed)

## 2023-01-18 NOTE — Progress Notes (Signed)
Office Visit Note   Patient: Paul [name]           Date of Birth: June 16, 1990           MRN: [id number] Visit Date: 01/18/2023              Requested by: Darral Dash, DO [address] [place],  [place] [address] PCP: Darral Dash, DO  Chief Complaint  Patient presents with   Left Foot - Wound Check    10/01/2022 left foot 1st ray amputation       HPI: Patient is a 32 year old gentleman who is status post left foot first ray amputation. Has been having his callus pared q 2 weeks.  Assessment & Plan: Visit Diagnoses:  No diagnosis found.   Plan: Calluses were pared. Will continue daily dial soap cleansing and packing open. Follow up in 2 weeks  Follow-Up Instructions: No follow-ups on file.   Ortho Exam  Patient is alert, oriented, no adenopathy, well-dressed, normal affect, normal respiratory effort. Examination patient has a wound that is 2 x 5 cm with 100% healthy granulation tissue continue routine wound care he has hypertrophic callus that was pared with good bleeding. No erythema or warmth.   Imaging: No results found. No images are attached to the encounter.  Labs: Lab Results  Component Value Date   HGBA1C 7.4 (H) 09/30/2022   HGBA1C 6.3 (H) 04/18/2022   HGBA1C 6.2 09/22/2021   REPTSTATUS 10/06/2022 FINAL 10/01/2022   GRAMSTAIN  10/01/2022    FEW WBC PRESENT, PREDOMINANTLY PMN RARE GRAM POSITIVE COCCI RARE SQUAMOUS EPITHELIAL CELLS PRESENT    CULT  10/01/2022    RARE ESCHERICHIA COLI RARE STENOTROPHOMONAS MALTOPHILIA NO ANAEROBES ISOLATED Performed at [facility], [address], [place], [place] [address]    [facility] ESCHERICHIA COLI 10/01/2022   LABORGA STENOTROPHOMONAS MALTOPHILIA 10/01/2022     Lab Results  Component Value Date   ALBUMIN 3.3 (L) 09/30/2022   ALBUMIN 4.4 09/29/2022   ALBUMIN 3.7 05/05/2022    Lab Results  Component Value Date   MG 2.8 (H) 09/26/2016   MG 2.8 (H) 09/25/2016   MG 2.8 (H) 09/24/2016   Lab  Results  Component Value Date   VD25OH 6.8 (L) 09/14/2016    No results found for: "PREALBUMIN"    Latest Ref Rng & Units 11/26/2022   12:44 PM 10/02/2022   12:21 AM 10/01/2022    4:06 PM  CBC EXTENDED  WBC 4.0 - 10.5 K/uL 10.4  10.6  8.5   RBC 4.22 - 5.81 MIL/uL 5.86  5.15  4.66   Hemoglobin 13.0 - 17.0 g/dL 84.6  96.2  95.2   HCT 39.0 - 52.0 % 48.2  41.4  37.9   Platelets 150 - 400 K/uL 361  261  241      There is no height or weight on file to calculate BMI.  Orders:  No orders of the defined types were placed in this encounter.  No orders of the defined types were placed in this encounter.    Procedures: No procedures performed  Clinical Data: No additional findings.  ROS:  All other systems negative, except as noted in the HPI. Review of Systems  Objective: Vital Signs: There were no vitals taken for this visit.  Specialty Comments:  No specialty comments available.  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   Thrombus 05/04/2022   Ischemia of left upper extremity 05/04/2022   Complication of vascular access for dialysis 04/19/2022   ESRD (end stage renal disease) (HCC) 04/19/2022   Failure of hemodialysis access (HCC) 04/18/2022   Left hand pain 09/22/2021   Diabetic foot infection (HCC)    Wound infection 05/07/2021   MSSA bacteremia 05/07/2021   Osteomyelitis (HCC) 05/07/2021   Abscess 05/07/2021   Sepsis due to cellulitis (HCC) 05/06/2021   Poor compliance with medication 03/17/2021   Fluid overload, unspecified 07/09/2020   Complication of vascular dialysis catheter 03/23/2020   Other disorders of phosphorus metabolism 01/28/2020   Hypertension 01/08/2020   Hypocalcemia 10/29/2019   ESRD on dialysis (HCC) 11/16/2017   Pruritus, unspecified 04/04/2017   Unspecified protein-calorie  malnutrition (HCC) 11/11/2016   Headache, unspecified 10/29/2016   Encounter for immunization 10/19/2016   Iron deficiency anemia, unspecified 10/10/2016   Anemia in chronic kidney disease 09/30/2016   Coagulation defect, unspecified (HCC) 09/30/2016   Pneumonia due to Pseudomonas (HCC) 09/30/2016   Secondary hyperparathyroidism of renal origin (HCC) 09/30/2016   Hypothyroidism 10/29/2009   Type 2 diabetes mellitus (HCC) 04/14/2009   Hyperlipidemia 04/14/2009   OBESITY, MORBID 04/14/2009   Essential hypertension, benign 04/14/2009   Past Medical History:  Diagnosis Date   Acute hypoxemic respiratory failure (HCC) 09/11/2016   Anemia    Chronic kidney disease    ARF on CRF Dialysis T/TH/Sa   Diabetes mellitus    Type II   End stage renal disease on dialysis (HCC)    [place]    GERD (gastroesophageal reflux disease)    diet controlled   HLD (hyperlipidemia)    Hypertension    Hypothyroidism    Morbid obesity (HCC)    Sacral wound    resolved   Thyroid disease     Family History  Problem Relation Age of Onset   Heart disease Mother    Hypertension Mother     Past Surgical History:  Procedure Laterality Date   AMPUTATION Left 05/08/2021   Procedure: AMPUTATION GREAT TOE;  Surgeon: Nadara Mustard, MD;  Location: [facility] OR;  Service: Orthopedics;  Laterality: Left;   AMPUTATION Left 10/01/2022   Procedure: LEFT FOOT AMPUTATION RAY 1st;  Surgeon: Nadara Mustard, MD;  Location: [facility] OR;  Service: Orthopedics;  Laterality: Left;   AV FISTULA PLACEMENT Left 09/26/2016   Procedure: LEFT UPPER ARM ARTERIOVENOUS (AV) FISTULA CREATION;  Surgeon: Sherren Kerns, MD;  Location: [facility] OR;  Service: Vascular;  Laterality: Left;   AV FISTULA PLACEMENT Left 12/02/2016   Procedure: INSERTION OF ARTERIOVENOUS GORE-TEX GRAFT LEFT UPPER  ARM USING A 4-7MM BY 45CM GRAFT ;  Surgeon: Larina Earthly, MD;  Location: [facility] OR;  Service: Vascular;  Laterality: Left;   AV FISTULA PLACEMENT Right 07/03/2020    Procedure: INSERTION OF ARTERIOVENOUS (AV) GORE-TEX GRAFT RIGHT UPPER ARM;  Surgeon: Cephus Shelling, MD;  Location: [facility] OR;  Service: Vascular;  Laterality: Right;   AV FISTULA PLACEMENT Left 07/02/2021   Procedure: LEFT UPPER EXTREMITY ARTERIOVENOUS FISTULA CREATION;  Surgeon: Maeola Harman, MD;  Location: [facility] OR;  Service: Vascular;  Laterality: Left;   BASCILIC VEIN TRANSPOSITION Right 05/06/2020   Procedure: FIRST STAGE RIGHT BASCILIC VEIN TRANSPOSITION;  Surgeon: Nada Libman, MD;  Location: [facility] OR;  Service: Vascular;  Laterality: Right;   BASCILIC VEIN TRANSPOSITION Left 08/20/2021   Procedure: LEFT ARM SECOND STAGE WITH ARTERIOVENOUS GRAFT;  Surgeon: Maeola Harman, MD;  Location: [facility] OR;  Service: Vascular;  Laterality: Left;   EXCHANGE OF A DIALYSIS CATHETER Right 10/08/2016   Procedure: EXCHANGE OF A DIALYSIS CATHETER-RIGHT INTERNAL JUGULAR;  Surgeon: Fransisco Hertz, MD;  Location: [facility] OR;  Service: Vascular;  Laterality: Right;   INSERTION OF DIALYSIS CATHETER Right 09/26/2016   Procedure: INSERTION OF DIALYSIS CATHETER - Right Internal Jugular Placement;  Surgeon: Sherren Kerns, MD;  Location: [facility] OR;  Service: Vascular;  Laterality: Right;   INSERTION OF DIALYSIS CATHETER Left 03/22/2018   Procedure: INSERTION OF DIALYSIS CATHETER;  Surgeon: Cephus Shelling, MD;  Location: [facility] OR;  Service: Vascular;  Laterality: Left;   INTRAOPERATIVE ARTERIOGRAM Left 05/04/2022   Procedure: INTRA OPERATIVE ARTERIOGRAM;  Surgeon: Victorino Sparrow, MD;  Location: [facility] OR;  Service: Vascular;  Laterality: Left;   IR AV DIALY SHUNT INTRO NEEDLE/INTRACATH INITIAL W/PTA/IMG LEFT  08/19/2022   IR AV DIALY SHUNT INTRO NEEDLE/INTRACATH INITIAL W/PTA/IMG LEFT  11/14/2022   IR DIALY SHUNT INTRO NEEDLE/INTRACATH INITIAL W/IMG LEFT Left 04/15/2022   IR FLUORO GUIDE CV LINE LEFT  04/19/2022   IR FLUORO GUIDE CV LINE LEFT  05/30/2022   IR FLUORO GUIDE CV LINE RIGHT  03/26/2021   IR  FLUORO GUIDE CV LINE RIGHT  05/13/2021   IR FLUORO GUIDE CV LINE RIGHT  05/04/2022   IR REMOVAL TUN CV CATH W/O FL  05/11/2021   IR REMOVAL TUN CV CATH W/O FL  10/06/2021   IR THROMBECTOMY AV FISTULA W/THROMBOLYSIS/PTA INC/SHUNT/IMG LEFT Left 02/02/2022   IR THROMBECTOMY AV FISTULA W/THROMBOLYSIS/PTA INC/SHUNT/IMG LEFT Left 03/22/2022   IR THROMBECTOMY AV FISTULA W/THROMBOLYSIS/PTA INC/SHUNT/IMG LEFT Left 05/04/2022   IR THROMBECTOMY AV FISTULA W/THROMBOLYSIS/PTA INC/SHUNT/IMG LEFT Left 12/09/2022   IR THROMBECTOMY AV FISTULA W/THROMBOLYSIS/PTA/STENT INC/SHUNT/IMG LT Left 04/27/2022   IR US GUIDE VASC ACCESS LEFT  02/02/2022   IR US GUIDE VASC ACCESS LEFT  03/22/2022   IR US GUIDE VASC ACCESS LEFT  04/15/2022   IR US GUIDE VASC ACCESS LEFT  04/19/2022   IR US GUIDE VASC ACCESS LEFT  04/27/2022   IR US GUIDE VASC ACCESS LEFT  12/13/2022   IR US GUIDE VASC ACCESS RIGHT  03/26/2021   IR US GUIDE VASC ACCESS RIGHT  05/13/2021   IR US GUIDE VASC ACCESS RIGHT  05/04/2022   REVISON OF ARTERIOVENOUS FISTULA Left 03/22/2018   Procedure: LEFT UPPER EXTREMITY ARTERIOVENOUS  REVISION WITH GORE-TEX GRAFT.;  Surgeon: Cephus Shelling, MD;  Location: [facility] OR;  Service: Vascular;  Laterality: Left;   TEE WITHOUT CARDIOVERSION N/A 05/12/2021   Procedure: TRANSESOPHAGEAL ECHOCARDIOGRAM (TEE);  Surgeon: Christell Constant, MD;  Location: [facility] ENDOSCOPY;  Service: Cardiovascular;  Laterality: N/A;   THROMBECTOMY Left 08/26/2019   thrombectomy of LUA loop AVG   THROMBECTOMY AND REVISION OF ARTERIOVENTOUS (AV) GORETEX  GRAFT Right 07/27/2020   Procedure: INSERTION OF RIGHT ARM LOOP GRAFT WITH EXCISION OF RIGHT ARM BRACHIAL AXILLARY GRAFT;  Surgeon: Cephus Shelling, MD;  Location: [facility] OR;  Service: Vascular;  Laterality: Right;   THROMBECTOMY BRACHIAL ARTERY Left 05/04/2022   Procedure: LEFT ARM RADIAL BRACHIAL ARTERY EMBOLECTOMY;  Surgeon: Victorino Sparrow, MD;  Location: [facility] OR;  Service: Vascular;  Laterality:  Left;   UPPER EXTREMITY VENOGRAPHY Bilateral 06/14/2021   Procedure: UPPER EXTREMITY VENOGRAPHY;  Surgeon: Maeola Harman, MD;  Location: [facility] INVASIVE CV LAB;  Service: Cardiovascular;  Laterality: Bilateral;   VISCERAL ANGIOGRAPHY Right 06/17/2020   Procedure: CENTRAL VENO;  Surgeon: Leonie Douglas, MD;  Location: [facility] INVASIVE CV LAB;  Service: Cardiovascular;  Laterality: Right;   Social History   Occupational History   Not on file  Tobacco Use   Smoking status: Never   Smokeless tobacco: Never  Vaping Use   Vaping status: Never Used  Substance and Sexual Activity   Alcohol use: No   Drug use: Yes    Frequency: 2.0 times per week    Types: Marijuana    Comment: Last use was on 08/17/21   Sexual activity: Yes

## 2023-01-21 DIAGNOSIS — Z992 Dependence on renal dialysis: Secondary | ICD-10-CM | POA: Diagnosis not present

## 2023-01-21 DIAGNOSIS — E1129 Type 2 diabetes mellitus with other diabetic kidney complication: Secondary | ICD-10-CM | POA: Diagnosis not present

## 2023-01-21 DIAGNOSIS — N186 End stage renal disease: Secondary | ICD-10-CM | POA: Diagnosis not present

## 2023-02-01 ENCOUNTER — Ambulatory Visit (INDEPENDENT_AMBULATORY_CARE_PROVIDER_SITE_OTHER): Payer: Medicaid Other | Admitting: Family

## 2023-02-01 ENCOUNTER — Encounter: Payer: Self-pay | Admitting: Family

## 2023-02-01 ENCOUNTER — Telehealth: Payer: Self-pay | Admitting: Family

## 2023-02-01 DIAGNOSIS — Z89432 Acquired absence of left foot: Secondary | ICD-10-CM

## 2023-02-01 MED ORDER — OXYCODONE HCL 5 MG PO TABS: 5.0000 mg | ORAL_TABLET | Freq: Three times a day (TID) | ORAL | 0 refills | Status: DC | PRN

## 2023-02-01 NOTE — Progress Notes (Signed)
Office Visit Note   Patient: Paul [name]           Date of Birth: 1990/07/08           MRN: [id number] Visit Date: 02/01/2023              Requested by: Darral Dash, DO [address] [place],  [place] [address] PCP: Darral Dash, DO  Chief Complaint  Patient presents with   Left Foot - Follow-up    10/01/2022 left foot 1st ray amputation      HPI: Patient is a 32 year old gentleman who is status post left foot first ray amputation. Has been having his callus pared q 2 weeks.  This continues to slowly heal.  He has no concerns today.  Assessment & Plan: Visit Diagnoses:  No diagnosis found.   Plan: Calluses were pared.  Kerecis micro powder powder donation was applied to his wound today.  He will leave this in place for 1 week.  Will continue daily dial soap cleansing and packing open. Follow up in 2 weeks  Follow-Up Instructions: No follow-ups on file.   Ortho Exam  Patient is alert, oriented, no adenopathy, well-dressed, normal affect, normal respiratory effort. Examination patient has a wound that is 1 x 5 cm with 1 cm of depth with 100% healthy granulation tissue continue routine wound care he has hypertrophic callus that was pared with good bleeding. No erythema or warmth.   Imaging: No results found. No images are attached to the encounter.  Labs: Lab Results  Component Value Date   HGBA1C 7.4 (H) 09/30/2022   HGBA1C 6.3 (H) 04/18/2022   HGBA1C 6.2 09/22/2021   REPTSTATUS 10/06/2022 FINAL 10/01/2022   GRAMSTAIN  10/01/2022    FEW WBC PRESENT, PREDOMINANTLY PMN RARE GRAM POSITIVE COCCI RARE SQUAMOUS EPITHELIAL CELLS PRESENT    CULT  10/01/2022    RARE ESCHERICHIA COLI RARE STENOTROPHOMONAS MALTOPHILIA NO ANAEROBES ISOLATED Performed at [facility], [address], [place], [place] [address]    [facility] ESCHERICHIA COLI 10/01/2022   LABORGA STENOTROPHOMONAS MALTOPHILIA 10/01/2022     Lab Results  Component Value Date   ALBUMIN 3.3  (L) 09/30/2022   ALBUMIN 4.4 09/29/2022   ALBUMIN 3.7 05/05/2022    Lab Results  Component Value Date   MG 2.8 (H) 09/26/2016   MG 2.8 (H) 09/25/2016   MG 2.8 (H) 09/24/2016   Lab Results  Component Value Date   VD25OH 6.8 (L) 09/14/2016    No results found for: "PREALBUMIN"    Latest Ref Rng & Units 11/26/2022   12:44 PM 10/02/2022   12:21 AM 10/01/2022    4:06 PM  CBC EXTENDED  WBC 4.0 - 10.5 K/uL 10.4  10.6  8.5   RBC 4.22 - 5.81 MIL/uL 5.86  5.15  4.66   Hemoglobin 13.0 - 17.0 g/dL 86.5  78.4  69.6   HCT 39.0 - 52.0 % 48.2  41.4  37.9   Platelets 150 - 400 K/uL 361  261  241      There is no height or weight on file to calculate BMI.  Orders:  No orders of the defined types were placed in this encounter.  No orders of the defined types were placed in this encounter.    Procedures: No procedures performed  Clinical Data: No additional findings.  ROS:  All other systems negative, except as noted in the HPI. Review of Systems  Objective: Vital Signs: There were no vitals taken for  this visit.  Specialty Comments:  No specialty comments available.  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   Thrombus 05/04/2022   Ischemia of left upper extremity 05/04/2022   Complication of vascular access for dialysis 04/19/2022   ESRD (end stage renal disease) (HCC) 04/19/2022   Failure of hemodialysis access (HCC) 04/18/2022   Left hand pain 09/22/2021   Diabetic foot infection (HCC)    Wound infection 05/07/2021   MSSA bacteremia 05/07/2021   Osteomyelitis (HCC) 05/07/2021   Abscess 05/07/2021   Sepsis due to cellulitis (HCC) 05/06/2021   Poor compliance with medication 03/17/2021   Fluid overload, unspecified 07/09/2020   Complication of vascular dialysis catheter 03/23/2020   Other disorders of  phosphorus metabolism 01/28/2020   Hypertension 01/08/2020   Hypocalcemia 10/29/2019   ESRD on dialysis (HCC) 11/16/2017   Pruritus, unspecified 04/04/2017   Unspecified protein-calorie malnutrition (HCC) 11/11/2016   Headache, unspecified 10/29/2016   Encounter for immunization 10/19/2016   Iron deficiency anemia, unspecified 10/10/2016   Anemia in chronic kidney disease 09/30/2016   Coagulation defect, unspecified (HCC) 09/30/2016   Pneumonia due to Pseudomonas (HCC) 09/30/2016   Secondary hyperparathyroidism of renal origin (HCC) 09/30/2016   Hypothyroidism 10/29/2009   Type 2 diabetes mellitus (HCC) 04/14/2009   Hyperlipidemia 04/14/2009   OBESITY, MORBID 04/14/2009   Essential hypertension, benign 04/14/2009   Past Medical History:  Diagnosis Date   Acute hypoxemic respiratory failure (HCC) 09/11/2016   Anemia    Chronic kidney disease    ARF on CRF Dialysis T/TH/Sa   Diabetes mellitus    Type II   End stage renal disease on dialysis (HCC)    [place]    GERD (gastroesophageal reflux disease)    diet controlled   HLD (hyperlipidemia)    Hypertension    Hypothyroidism    Morbid obesity (HCC)    Sacral wound    resolved   Thyroid disease     Family History  Problem Relation Age of Onset   Heart disease Mother    Hypertension Mother     Past Surgical History:  Procedure Laterality Date   AMPUTATION Left 05/08/2021   Procedure: AMPUTATION GREAT TOE;  Surgeon: Nadara Mustard, MD;  Location: [facility] OR;  Service: Orthopedics;  Laterality: Left;   AMPUTATION Left 10/01/2022   Procedure: LEFT FOOT AMPUTATION RAY 1st;  Surgeon: Nadara Mustard, MD;  Location: [facility] OR;  Service: Orthopedics;  Laterality: Left;   AV FISTULA PLACEMENT Left 09/26/2016   Procedure: LEFT UPPER ARM ARTERIOVENOUS (AV) FISTULA CREATION;  Surgeon: Sherren Kerns, MD;  Location: [facility] OR;  Service: Vascular;  Laterality: Left;   AV FISTULA PLACEMENT Left 12/02/2016   Procedure: INSERTION OF  ARTERIOVENOUS GORE-TEX GRAFT LEFT UPPER  ARM USING A 4-7MM BY 45CM GRAFT ;  Surgeon: Larina Earthly, MD;  Location: [facility] OR;  Service: Vascular;  Laterality: Left;   AV FISTULA PLACEMENT Right 07/03/2020   Procedure: INSERTION OF ARTERIOVENOUS (AV) GORE-TEX GRAFT RIGHT UPPER ARM;  Surgeon: Cephus Shelling, MD;  Location: [facility] OR;  Service: Vascular;  Laterality: Right;   AV FISTULA PLACEMENT Left 07/02/2021   Procedure: LEFT UPPER EXTREMITY ARTERIOVENOUS FISTULA CREATION;  Surgeon: Maeola Harman, MD;  Location: [facility] OR;  Service: Vascular;  Laterality: Left;   BASCILIC VEIN TRANSPOSITION Right 05/06/2020   Procedure: FIRST STAGE  RIGHT BASCILIC VEIN TRANSPOSITION;  Surgeon: Nada Libman, MD;  Location: [facility] OR;  Service: Vascular;  Laterality: Right;   BASCILIC VEIN TRANSPOSITION Left 08/20/2021   Procedure: LEFT ARM SECOND STAGE WITH ARTERIOVENOUS GRAFT;  Surgeon: Maeola Harman, MD;  Location: [facility] OR;  Service: Vascular;  Laterality: Left;   EXCHANGE OF A DIALYSIS CATHETER Right 10/08/2016   Procedure: EXCHANGE OF A DIALYSIS CATHETER-RIGHT INTERNAL JUGULAR;  Surgeon: Fransisco Hertz, MD;  Location: [facility] OR;  Service: Vascular;  Laterality: Right;   INSERTION OF DIALYSIS CATHETER Right 09/26/2016   Procedure: INSERTION OF DIALYSIS CATHETER - Right Internal Jugular Placement;  Surgeon: Sherren Kerns, MD;  Location: [facility] OR;  Service: Vascular;  Laterality: Right;   INSERTION OF DIALYSIS CATHETER Left 03/22/2018   Procedure: INSERTION OF DIALYSIS CATHETER;  Surgeon: Cephus Shelling, MD;  Location: [facility] OR;  Service: Vascular;  Laterality: Left;   INTRAOPERATIVE ARTERIOGRAM Left 05/04/2022   Procedure: INTRA OPERATIVE ARTERIOGRAM;  Surgeon: Victorino Sparrow, MD;  Location: [facility] OR;  Service: Vascular;  Laterality: Left;   IR AV DIALY SHUNT INTRO NEEDLE/INTRACATH INITIAL W/PTA/IMG LEFT  08/19/2022   IR AV DIALY SHUNT INTRO NEEDLE/INTRACATH INITIAL W/PTA/IMG LEFT  11/14/2022   IR DIALY  SHUNT INTRO NEEDLE/INTRACATH INITIAL W/IMG LEFT Left 04/15/2022   IR FLUORO GUIDE CV LINE LEFT  04/19/2022   IR FLUORO GUIDE CV LINE LEFT  05/30/2022   IR FLUORO GUIDE CV LINE RIGHT  03/26/2021   IR FLUORO GUIDE CV LINE RIGHT  05/13/2021   IR FLUORO GUIDE CV LINE RIGHT  05/04/2022   IR REMOVAL TUN CV CATH W/O FL  05/11/2021   IR REMOVAL TUN CV CATH W/O FL  10/06/2021   IR THROMBECTOMY AV FISTULA W/THROMBOLYSIS/PTA INC/SHUNT/IMG LEFT Left 02/02/2022   IR THROMBECTOMY AV FISTULA W/THROMBOLYSIS/PTA INC/SHUNT/IMG LEFT Left 03/22/2022   IR THROMBECTOMY AV FISTULA W/THROMBOLYSIS/PTA INC/SHUNT/IMG LEFT Left 05/04/2022   IR THROMBECTOMY AV FISTULA W/THROMBOLYSIS/PTA INC/SHUNT/IMG LEFT Left 12/09/2022   IR THROMBECTOMY AV FISTULA W/THROMBOLYSIS/PTA/STENT INC/SHUNT/IMG LT Left 04/27/2022   IR US GUIDE VASC ACCESS LEFT  02/02/2022   IR US GUIDE VASC ACCESS LEFT  03/22/2022   IR US GUIDE VASC ACCESS LEFT  04/15/2022   IR US GUIDE VASC ACCESS LEFT  04/19/2022   IR US GUIDE VASC ACCESS LEFT  04/27/2022   IR US GUIDE VASC ACCESS LEFT  12/13/2022   IR US GUIDE VASC ACCESS RIGHT  03/26/2021   IR US GUIDE VASC ACCESS RIGHT  05/13/2021   IR US GUIDE VASC ACCESS RIGHT  05/04/2022   REVISON OF ARTERIOVENOUS FISTULA Left 03/22/2018   Procedure: LEFT UPPER EXTREMITY ARTERIOVENOUS  REVISION WITH GORE-TEX GRAFT.;  Surgeon: Cephus Shelling, MD;  Location: [facility] OR;  Service: Vascular;  Laterality: Left;   TEE WITHOUT CARDIOVERSION N/A 05/12/2021   Procedure: TRANSESOPHAGEAL ECHOCARDIOGRAM (TEE);  Surgeon: Christell Constant, MD;  Location: [facility] ENDOSCOPY;  Service: Cardiovascular;  Laterality: N/A;   THROMBECTOMY Left 08/26/2019   thrombectomy of LUA loop AVG   THROMBECTOMY AND REVISION OF ARTERIOVENTOUS (AV) GORETEX  GRAFT Right 07/27/2020   Procedure: INSERTION OF RIGHT ARM LOOP GRAFT WITH EXCISION OF RIGHT ARM BRACHIAL AXILLARY GRAFT;  Surgeon: Cephus Shelling, MD;  Location: [facility] OR;  Service: Vascular;   Laterality: Right;   THROMBECTOMY BRACHIAL ARTERY Left 05/04/2022   Procedure: LEFT ARM RADIAL BRACHIAL ARTERY EMBOLECTOMY;  Surgeon: Victorino Sparrow, MD;  Location: [facility] OR;  Service: Vascular;  Laterality: Left;   UPPER EXTREMITY VENOGRAPHY  Bilateral 06/14/2021   Procedure: UPPER EXTREMITY VENOGRAPHY;  Surgeon: Maeola Harman, MD;  Location: [facility] INVASIVE CV LAB;  Service: Cardiovascular;  Laterality: Bilateral;   VISCERAL ANGIOGRAPHY Right 06/17/2020   Procedure: CENTRAL VENO;  Surgeon: Leonie Douglas, MD;  Location: [facility] INVASIVE CV LAB;  Service: Cardiovascular;  Laterality: Right;   Social History   Occupational History   Not on file  Tobacco Use   Smoking status: Never   Smokeless tobacco: Never  Vaping Use   Vaping status: Never Used  Substance and Sexual Activity   Alcohol use: No   Drug use: Yes    Frequency: 2.0 times per week    Types: Marijuana    Comment: Last use was on 08/17/21   Sexual activity: Yes

## 2023-02-01 NOTE — Telephone Encounter (Signed)
Spoke with patient he asked if he can get Rx for Oxycodone sent to the pharmacy. The number to contact patient is [phone]

## 2023-02-15 ENCOUNTER — Ambulatory Visit (INDEPENDENT_AMBULATORY_CARE_PROVIDER_SITE_OTHER): Payer: Medicaid Other | Admitting: Family

## 2023-02-15 ENCOUNTER — Encounter: Payer: Self-pay | Admitting: Family

## 2023-02-15 DIAGNOSIS — Z89432 Acquired absence of left foot: Secondary | ICD-10-CM | POA: Diagnosis not present

## 2023-02-15 DIAGNOSIS — S91302A Unspecified open wound, left foot, initial encounter: Secondary | ICD-10-CM | POA: Diagnosis not present

## 2023-02-15 MED ORDER — OXYCODONE HCL 5 MG PO TABS: 5.0000 mg | ORAL_TABLET | Freq: Two times a day (BID) | ORAL | 0 refills | Status: DC | PRN

## 2023-02-15 NOTE — Progress Notes (Signed)
Office Visit Note   Patient: Paul [name]           Date of Birth: 1990-10-03           MRN: [id number] Visit Date: 02/15/2023              Requested by: Darral Dash, DO [address] [place],  [place] [address] PCP: Darral Dash, DO  Chief Complaint  Patient presents with   Left Foot - Follow-up    10/01/2022 left foot 1st ray amputation         HPI: Patient is a 32 year old gentleman who is status post left foot first ray amputation. Has been having his callus pared q 2 weeks.  This continues to slowly heal.  He has no concerns today.  Assessment & Plan: Visit Diagnoses:  No diagnosis found.   Plan: Calluses were pared.  Will continue daily dial soap cleansing and packing open. Follow up in 2 weeks  Follow-Up Instructions: Return in about 2 weeks (around 03/01/2023).   Ortho Exam  Patient is alert, oriented, no adenopathy, well-dressed, normal affect, normal respiratory effort. Examination patient has a wound that is 0.8 x 5 cm with 0.5 cm of depth with 100% healthy granulation tissue continue routine wound care he has hypertrophic callus that was pared with good bleeding. No erythema or warmth.   Imaging: No results found. No images are attached to the encounter.  Labs: Lab Results  Component Value Date   HGBA1C 7.4 (H) 09/30/2022   HGBA1C 6.3 (H) 04/18/2022   HGBA1C 6.2 09/22/2021   REPTSTATUS 10/06/2022 FINAL 10/01/2022   GRAMSTAIN  10/01/2022    FEW WBC PRESENT, PREDOMINANTLY PMN RARE GRAM POSITIVE COCCI RARE SQUAMOUS EPITHELIAL CELLS PRESENT    CULT  10/01/2022    RARE ESCHERICHIA COLI RARE STENOTROPHOMONAS MALTOPHILIA NO ANAEROBES ISOLATED Performed at [facility], [address], [place], [place] [address]    [facility] ESCHERICHIA COLI 10/01/2022   LABORGA STENOTROPHOMONAS MALTOPHILIA 10/01/2022     Lab Results  Component Value Date   ALBUMIN 3.3 (L) 09/30/2022   ALBUMIN 4.4 09/29/2022   ALBUMIN 3.7 05/05/2022    Lab  Results  Component Value Date   MG 2.8 (H) 09/26/2016   MG 2.8 (H) 09/25/2016   MG 2.8 (H) 09/24/2016   Lab Results  Component Value Date   VD25OH 6.8 (L) 09/14/2016    No results found for: "PREALBUMIN"    Latest Ref Rng & Units 11/26/2022   12:44 PM 10/02/2022   12:21 AM 10/01/2022    4:06 PM  CBC EXTENDED  WBC 4.0 - 10.5 K/uL 10.4  10.6  8.5   RBC 4.22 - 5.81 MIL/uL 5.86  5.15  4.66   Hemoglobin 13.0 - 17.0 g/dL 82.9  56.2  13.0   HCT 39.0 - 52.0 % 48.2  41.4  37.9   Platelets 150 - 400 K/uL 361  261  241      There is no height or weight on file to calculate BMI.  Orders:  No orders of the defined types were placed in this encounter.  No orders of the defined types were placed in this encounter.    Procedures: No procedures performed  Clinical Data: No additional findings.  ROS:  All other systems negative, except as noted in the HPI. Review of Systems  Objective: Vital Signs: There were no vitals taken for this visit.  Specialty Comments:  No specialty comments available.  PMFS History: Patient Active Problem  List   Diagnosis Date Noted   Acute osteomyelitis of left foot (HCC) 09/30/2022   Sepsis (HCC) 09/29/2022   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   Thrombus 05/04/2022   Ischemia of left upper extremity 05/04/2022   Complication of vascular access for dialysis 04/19/2022   ESRD (end stage renal disease) (HCC) 04/19/2022   Failure of hemodialysis access (HCC) 04/18/2022   Left hand pain 09/22/2021   Diabetic foot infection (HCC)    Wound infection 05/07/2021   MSSA bacteremia 05/07/2021   Osteomyelitis (HCC) 05/07/2021   Abscess 05/07/2021   Sepsis due to cellulitis (HCC) 05/06/2021   Poor compliance with medication 03/17/2021   Fluid overload, unspecified 07/09/2020   Complication of vascular dialysis catheter 03/23/2020   Other disorders of phosphorus metabolism 01/28/2020   Hypertension 01/08/2020   Hypocalcemia 10/29/2019    ESRD on dialysis (HCC) 11/16/2017   Pruritus, unspecified 04/04/2017   Unspecified protein-calorie malnutrition (HCC) 11/11/2016   Headache, unspecified 10/29/2016   Encounter for immunization 10/19/2016   Iron deficiency anemia, unspecified 10/10/2016   Anemia in chronic kidney disease 09/30/2016   Coagulation defect, unspecified (HCC) 09/30/2016   Pneumonia due to Pseudomonas (HCC) 09/30/2016   Secondary hyperparathyroidism of renal origin (HCC) 09/30/2016   Hypothyroidism 10/29/2009   Type 2 diabetes mellitus (HCC) 04/14/2009   Hyperlipidemia 04/14/2009   OBESITY, MORBID 04/14/2009   Essential hypertension, benign 04/14/2009   Past Medical History:  Diagnosis Date   Acute hypoxemic respiratory failure (HCC) 09/11/2016   Anemia    Chronic kidney disease    ARF on CRF Dialysis T/TH/Sa   Diabetes mellitus    Type II   End stage renal disease on dialysis (HCC)    [place]    GERD (gastroesophageal reflux disease)    diet controlled   HLD (hyperlipidemia)    Hypertension    Hypothyroidism    Morbid obesity (HCC)    Sacral wound    resolved   Thyroid disease     Family History  Problem Relation Age of Onset   Heart disease Mother    Hypertension Mother     Past Surgical History:  Procedure Laterality Date   AMPUTATION Left 05/08/2021   Procedure: AMPUTATION GREAT TOE;  Surgeon: Nadara Mustard, MD;  Location: [facility] OR;  Service: Orthopedics;  Laterality: Left;   AMPUTATION Left 10/01/2022   Procedure: LEFT FOOT AMPUTATION RAY 1st;  Surgeon: Nadara Mustard, MD;  Location: [facility] OR;  Service: Orthopedics;  Laterality: Left;   AV FISTULA PLACEMENT Left 09/26/2016   Procedure: LEFT UPPER ARM ARTERIOVENOUS (AV) FISTULA CREATION;  Surgeon: Sherren Kerns, MD;  Location: [facility] OR;  Service: Vascular;  Laterality: Left;   AV FISTULA PLACEMENT Left 12/02/2016   Procedure: INSERTION OF ARTERIOVENOUS GORE-TEX GRAFT LEFT UPPER  ARM USING A 4-7MM BY 45CM GRAFT ;  Surgeon: Larina Earthly, MD;  Location: [facility] OR;  Service: Vascular;  Laterality: Left;   AV FISTULA PLACEMENT Right 07/03/2020   Procedure: INSERTION OF ARTERIOVENOUS (AV) GORE-TEX GRAFT RIGHT UPPER ARM;  Surgeon: Cephus Shelling, MD;  Location: [facility] OR;  Service: Vascular;  Laterality: Right;   AV FISTULA PLACEMENT Left 07/02/2021   Procedure: LEFT UPPER EXTREMITY ARTERIOVENOUS FISTULA CREATION;  Surgeon: Maeola Harman, MD;  Location: [facility] OR;  Service: Vascular;  Laterality: Left;   BASCILIC VEIN TRANSPOSITION Right 05/06/2020   Procedure: FIRST STAGE RIGHT BASCILIC VEIN TRANSPOSITION;  Surgeon: Nada Libman, MD;  Location: [facility] OR;  Service:  Vascular;  Laterality: Right;   BASCILIC VEIN TRANSPOSITION Left 08/20/2021   Procedure: LEFT ARM SECOND STAGE WITH ARTERIOVENOUS GRAFT;  Surgeon: Maeola Harman, MD;  Location: [facility] OR;  Service: Vascular;  Laterality: Left;   EXCHANGE OF A DIALYSIS CATHETER Right 10/08/2016   Procedure: EXCHANGE OF A DIALYSIS CATHETER-RIGHT INTERNAL JUGULAR;  Surgeon: Fransisco Hertz, MD;  Location: [facility] OR;  Service: Vascular;  Laterality: Right;   INSERTION OF DIALYSIS CATHETER Right 09/26/2016   Procedure: INSERTION OF DIALYSIS CATHETER - Right Internal Jugular Placement;  Surgeon: Sherren Kerns, MD;  Location: [facility] OR;  Service: Vascular;  Laterality: Right;   INSERTION OF DIALYSIS CATHETER Left 03/22/2018   Procedure: INSERTION OF DIALYSIS CATHETER;  Surgeon: Cephus Shelling, MD;  Location: [facility] OR;  Service: Vascular;  Laterality: Left;   INTRAOPERATIVE ARTERIOGRAM Left 05/04/2022   Procedure: INTRA OPERATIVE ARTERIOGRAM;  Surgeon: Victorino Sparrow, MD;  Location: [facility] OR;  Service: Vascular;  Laterality: Left;   IR AV DIALY SHUNT INTRO NEEDLE/INTRACATH INITIAL W/PTA/IMG LEFT  08/19/2022   IR AV DIALY SHUNT INTRO NEEDLE/INTRACATH INITIAL W/PTA/IMG LEFT  11/14/2022   IR DIALY SHUNT INTRO NEEDLE/INTRACATH INITIAL W/IMG LEFT Left 04/15/2022   IR FLUORO GUIDE CV LINE LEFT   04/19/2022   IR FLUORO GUIDE CV LINE LEFT  05/30/2022   IR FLUORO GUIDE CV LINE RIGHT  03/26/2021   IR FLUORO GUIDE CV LINE RIGHT  05/13/2021   IR FLUORO GUIDE CV LINE RIGHT  05/04/2022   IR REMOVAL TUN CV CATH W/O FL  05/11/2021   IR REMOVAL TUN CV CATH W/O FL  10/06/2021   IR THROMBECTOMY AV FISTULA W/THROMBOLYSIS/PTA INC/SHUNT/IMG LEFT Left 02/02/2022   IR THROMBECTOMY AV FISTULA W/THROMBOLYSIS/PTA INC/SHUNT/IMG LEFT Left 03/22/2022   IR THROMBECTOMY AV FISTULA W/THROMBOLYSIS/PTA INC/SHUNT/IMG LEFT Left 05/04/2022   IR THROMBECTOMY AV FISTULA W/THROMBOLYSIS/PTA INC/SHUNT/IMG LEFT Left 12/09/2022   IR THROMBECTOMY AV FISTULA W/THROMBOLYSIS/PTA/STENT INC/SHUNT/IMG LT Left 04/27/2022   IR US GUIDE VASC ACCESS LEFT  02/02/2022   IR US GUIDE VASC ACCESS LEFT  03/22/2022   IR US GUIDE VASC ACCESS LEFT  04/15/2022   IR US GUIDE VASC ACCESS LEFT  04/19/2022   IR US GUIDE VASC ACCESS LEFT  04/27/2022   IR US GUIDE VASC ACCESS LEFT  12/13/2022   IR US GUIDE VASC ACCESS RIGHT  03/26/2021   IR US GUIDE VASC ACCESS RIGHT  05/13/2021   IR US GUIDE VASC ACCESS RIGHT  05/04/2022   REVISON OF ARTERIOVENOUS FISTULA Left 03/22/2018   Procedure: LEFT UPPER EXTREMITY ARTERIOVENOUS  REVISION WITH GORE-TEX GRAFT.;  Surgeon: Cephus Shelling, MD;  Location: [facility] OR;  Service: Vascular;  Laterality: Left;   TEE WITHOUT CARDIOVERSION N/A 05/12/2021   Procedure: TRANSESOPHAGEAL ECHOCARDIOGRAM (TEE);  Surgeon: Christell Constant, MD;  Location: [facility] ENDOSCOPY;  Service: Cardiovascular;  Laterality: N/A;   THROMBECTOMY Left 08/26/2019   thrombectomy of LUA loop AVG   THROMBECTOMY AND REVISION OF ARTERIOVENTOUS (AV) GORETEX  GRAFT Right 07/27/2020   Procedure: INSERTION OF RIGHT ARM LOOP GRAFT WITH EXCISION OF RIGHT ARM BRACHIAL AXILLARY GRAFT;  Surgeon: Cephus Shelling, MD;  Location: [facility] OR;  Service: Vascular;  Laterality: Right;   THROMBECTOMY BRACHIAL ARTERY Left 05/04/2022   Procedure: LEFT ARM RADIAL BRACHIAL  ARTERY EMBOLECTOMY;  Surgeon: Victorino Sparrow, MD;  Location: [facility] OR;  Service: Vascular;  Laterality: Left;   UPPER EXTREMITY VENOGRAPHY Bilateral 06/14/2021   Procedure: UPPER EXTREMITY VENOGRAPHY;  Surgeon: Maeola Harman, MD;  Location:  [facility] INVASIVE CV LAB;  Service: Cardiovascular;  Laterality: Bilateral;   VISCERAL ANGIOGRAPHY Right 06/17/2020   Procedure: CENTRAL VENO;  Surgeon: Leonie Douglas, MD;  Location: [facility] INVASIVE CV LAB;  Service: Cardiovascular;  Laterality: Right;   Social History   Occupational History   Not on file  Tobacco Use   Smoking status: Never   Smokeless tobacco: Never  Vaping Use   Vaping status: Never Used  Substance and Sexual Activity   Alcohol use: No   Drug use: Yes    Frequency: 2.0 times per week    Types: Marijuana    Comment: Last use was on 08/17/21   Sexual activity: Yes

## 2023-02-16 ENCOUNTER — Other Ambulatory Visit: Payer: Self-pay | Admitting: Radiology

## 2023-02-16 NOTE — H&P (Signed)
Chief Complaint: Clotted AVF. Request is for fistulogram possible intervention. Possible tunneled HD catheter placement for hemodialysis access.   Referring Physician(s): Peeples,Samuel J  Supervising Physician: Gilmer Mor  Patient Status: [facility] - Out-pt  History of Present Illness: Paul [name] is a 32 y.o. male  outpatient. Well known to IR. Patient is well known to the interventional radiology service secondary to multiple previous declot procedures. Currently receiving dialysis through a left upper arm brachial artery to basilic vein AV fistula. Created on 2.10.23 with the second stage performed on 3.31.24.  The last intervention. performed at the 7.20.24. angioplasty of the venous anastomosis and majority of the venous limb to 7 mm diameter with angioplasty of the venous anastomosis and venous outflow of the 8 mm  AVG created on 2.10.23 with the second stage performed on 3.31.23. Most recent tunneled HD catheter was a  left external jugular dialysis catheter placed on 6.24.24.  Team is reporting the current access is clotted. Team is requesting fistulogram possible intervention. Possible tunneled HD catheter placement for hemodialysis access  Mother at bedside. Currently without any significant complaints. Patient alert and laying in bed,calm. Denies any fevers, headache, chest pain, SOB, cough, abdominal pain, nausea, vomiting or bleeding. Return precautions and treatment recommendations and follow-up discussed with the patient  and his mother. Both who are agreeable with the plan.   Past Medical History:  Diagnosis Date   Acute hypoxemic respiratory failure (HCC) 09/11/2016   Anemia    Chronic kidney disease    ARF on CRF Dialysis T/TH/Sa   Diabetes mellitus    Type II   End stage renal disease on dialysis (HCC)    [place]    GERD (gastroesophageal reflux disease)    diet controlled   HLD (hyperlipidemia)    Hypertension    Hypothyroidism    Morbid obesity (HCC)     Sacral wound    resolved   Thyroid disease     Past Surgical History:  Procedure Laterality Date   AMPUTATION Left 05/08/2021   Procedure: AMPUTATION GREAT TOE;  Surgeon: Nadara Mustard, MD;  Location: [facility] OR;  Service: Orthopedics;  Laterality: Left;   AMPUTATION Left 10/01/2022   Procedure: LEFT FOOT AMPUTATION RAY 1st;  Surgeon: Nadara Mustard, MD;  Location: [facility] OR;  Service: Orthopedics;  Laterality: Left;   AV FISTULA PLACEMENT Left 09/26/2016   Procedure: LEFT UPPER ARM ARTERIOVENOUS (AV) FISTULA CREATION;  Surgeon: Sherren Kerns, MD;  Location: [facility] OR;  Service: Vascular;  Laterality: Left;   AV FISTULA PLACEMENT Left 12/02/2016   Procedure: INSERTION OF ARTERIOVENOUS GORE-TEX GRAFT LEFT UPPER  ARM USING A 4-7MM BY 45CM GRAFT ;  Surgeon: Larina Earthly, MD;  Location: [facility] OR;  Service: Vascular;  Laterality: Left;   AV FISTULA PLACEMENT Right 07/03/2020   Procedure: INSERTION OF ARTERIOVENOUS (AV) GORE-TEX GRAFT RIGHT UPPER ARM;  Surgeon: Cephus Shelling, MD;  Location: [facility] OR;  Service: Vascular;  Laterality: Right;   AV FISTULA PLACEMENT Left 07/02/2021   Procedure: LEFT UPPER EXTREMITY ARTERIOVENOUS FISTULA CREATION;  Surgeon: Maeola Harman, MD;  Location: [facility] OR;  Service: Vascular;  Laterality: Left;   BASCILIC VEIN TRANSPOSITION Right 05/06/2020   Procedure: FIRST STAGE RIGHT BASCILIC VEIN TRANSPOSITION;  Surgeon: Nada Libman, MD;  Location: [facility] OR;  Service: Vascular;  Laterality: Right;   BASCILIC VEIN TRANSPOSITION Left 08/20/2021   Procedure: LEFT ARM SECOND STAGE WITH ARTERIOVENOUS GRAFT;  Surgeon: Maeola Harman, MD;  Location: [facility] OR;  Service: Vascular;  Laterality: Left;   EXCHANGE OF A DIALYSIS CATHETER Right 10/08/2016   Procedure: EXCHANGE OF A DIALYSIS CATHETER-RIGHT INTERNAL JUGULAR;  Surgeon: Fransisco Hertz, MD;  Location: [facility] OR;  Service: Vascular;  Laterality: Right;   INSERTION OF DIALYSIS CATHETER Right 09/26/2016   Procedure: INSERTION  OF DIALYSIS CATHETER - Right Internal Jugular Placement;  Surgeon: Sherren Kerns, MD;  Location: [facility] OR;  Service: Vascular;  Laterality: Right;   INSERTION OF DIALYSIS CATHETER Left 03/22/2018   Procedure: INSERTION OF DIALYSIS CATHETER;  Surgeon: Cephus Shelling, MD;  Location: [facility] OR;  Service: Vascular;  Laterality: Left;   INTRAOPERATIVE ARTERIOGRAM Left 05/04/2022   Procedure: INTRA OPERATIVE ARTERIOGRAM;  Surgeon: Victorino Sparrow, MD;  Location: [facility] OR;  Service: Vascular;  Laterality: Left;   IR AV DIALY SHUNT INTRO NEEDLE/INTRACATH INITIAL W/PTA/IMG LEFT  08/19/2022   IR AV DIALY SHUNT INTRO NEEDLE/INTRACATH INITIAL W/PTA/IMG LEFT  11/14/2022   IR DIALY SHUNT INTRO NEEDLE/INTRACATH INITIAL W/IMG LEFT Left 04/15/2022   IR FLUORO GUIDE CV LINE LEFT  04/19/2022   IR FLUORO GUIDE CV LINE LEFT  05/30/2022   IR FLUORO GUIDE CV LINE RIGHT  03/26/2021   IR FLUORO GUIDE CV LINE RIGHT  05/13/2021   IR FLUORO GUIDE CV LINE RIGHT  05/04/2022   IR REMOVAL TUN CV CATH W/O FL  05/11/2021   IR REMOVAL TUN CV CATH W/O FL  10/06/2021   IR THROMBECTOMY AV FISTULA W/THROMBOLYSIS/PTA INC/SHUNT/IMG LEFT Left 02/02/2022   IR THROMBECTOMY AV FISTULA W/THROMBOLYSIS/PTA INC/SHUNT/IMG LEFT Left 03/22/2022   IR THROMBECTOMY AV FISTULA W/THROMBOLYSIS/PTA INC/SHUNT/IMG LEFT Left 05/04/2022   IR THROMBECTOMY AV FISTULA W/THROMBOLYSIS/PTA INC/SHUNT/IMG LEFT Left 12/09/2022   IR THROMBECTOMY AV FISTULA W/THROMBOLYSIS/PTA/STENT INC/SHUNT/IMG LT Left 04/27/2022   IR US GUIDE VASC ACCESS LEFT  02/02/2022   IR US GUIDE VASC ACCESS LEFT  03/22/2022   IR US GUIDE VASC ACCESS LEFT  04/15/2022   IR US GUIDE VASC ACCESS LEFT  04/19/2022   IR US GUIDE VASC ACCESS LEFT  04/27/2022   IR US GUIDE VASC ACCESS LEFT  12/13/2022   IR US GUIDE VASC ACCESS RIGHT  03/26/2021   IR US GUIDE VASC ACCESS RIGHT  05/13/2021   IR US GUIDE VASC ACCESS RIGHT  05/04/2022   REVISON OF ARTERIOVENOUS FISTULA Left 03/22/2018   Procedure: LEFT UPPER  EXTREMITY ARTERIOVENOUS  REVISION WITH GORE-TEX GRAFT.;  Surgeon: Cephus Shelling, MD;  Location: [facility] OR;  Service: Vascular;  Laterality: Left;   TEE WITHOUT CARDIOVERSION N/A 05/12/2021   Procedure: TRANSESOPHAGEAL ECHOCARDIOGRAM (TEE);  Surgeon: Christell Constant, MD;  Location: [facility] ENDOSCOPY;  Service: Cardiovascular;  Laterality: N/A;   THROMBECTOMY Left 08/26/2019   thrombectomy of LUA loop AVG   THROMBECTOMY AND REVISION OF ARTERIOVENTOUS (AV) GORETEX  GRAFT Right 07/27/2020   Procedure: INSERTION OF RIGHT ARM LOOP GRAFT WITH EXCISION OF RIGHT ARM BRACHIAL AXILLARY GRAFT;  Surgeon: Cephus Shelling, MD;  Location: [facility] OR;  Service: Vascular;  Laterality: Right;   THROMBECTOMY BRACHIAL ARTERY Left 05/04/2022   Procedure: LEFT ARM RADIAL BRACHIAL ARTERY EMBOLECTOMY;  Surgeon: Victorino Sparrow, MD;  Location: [facility] OR;  Service: Vascular;  Laterality: Left;   UPPER EXTREMITY VENOGRAPHY Bilateral 06/14/2021   Procedure: UPPER EXTREMITY VENOGRAPHY;  Surgeon: Maeola Harman, MD;  Location: [facility] INVASIVE CV LAB;  Service: Cardiovascular;  Laterality: Bilateral;   VISCERAL ANGIOGRAPHY Right 06/17/2020   Procedure: CENTRAL VENO;  Surgeon: Leonie Douglas, MD;  Location: [facility] INVASIVE CV  LAB;  Service: Cardiovascular;  Laterality: Right;    Allergies: Hydralazine hcl  Medications: Prior to Admission medications   Medication Sig Start Date End Date Taking? Authorizing Provider  ACCU-CHEK GUIDE test strip USE AS DIRECTED TWICE DAILY 12/08/22   Darral Dash, DO  Accu-Chek Softclix Lancets lancets Use as instructed Patient not taking: Reported on 09/30/2022 06/11/21   Alicia Amel, MD  apixaban Everlene Balls) 5 MG TABS tablet Take 1 tablet by mouth twice daily 11/21/22   Dameron, Nolberto Hanlon, DO  ascorbic acid (VITAMIN C) 1000 MG tablet Take 1 tablet (1,000 mg total) by mouth daily. 10/03/22   Dameron, Nolberto Hanlon, DO  aspirin EC 81 MG tablet Take 1 tablet (81 mg total) by mouth daily. Swallow  whole. 05/11/22   Dameron, Nolberto Hanlon, DO  Blood Glucose Monitoring Suppl (ACCU-CHEK GUIDE ME) w/Device KIT 1 kit by Does not apply route daily. Patient not taking: Reported on 09/30/2022 05/22/21   Alicia Amel, MD  calcium carbonate (TUMS - DOSED IN MG ELEMENTAL CALCIUM) 500 MG chewable tablet Chew 1,000 mg by mouth daily as needed for indigestion or heartburn.    [provider]  cetirizine (ZYRTEC) 10 MG tablet Take 1 tablet (10 mg total) by mouth daily as needed for allergies. 05/11/22   Dameron, Nolberto Hanlon, DO  cinacalcet (SENSIPAR) 30 MG tablet Take 30 mg by mouth daily. 08/12/21   [provider]  ciprofloxacin (CIPRO) 500 MG tablet Take 1 tablet (500 mg total) by mouth 2 (two) times daily. 10/03/22   Nadara Mustard, MD  ferric citrate (AURYXIA) 1 GM 210 MG(Fe) tablet Take 2-4 tablets (420-840 mg total) by mouth See admin instructions. Take 4 tablets (840 mg) by mouth with each meal & take 2 tablets (420 mg) by mouth with each snack. 05/11/22   Dameron, Nolberto Hanlon, DO  gabapentin (NEURONTIN) 100 MG capsule TAKE 1 CAPSULE BY MOUTH THREE TIMES DAILY 05/09/22   Maeola Harman, MD  gabapentin (NEURONTIN) 300 MG capsule Take 1 capsule by mouth at bedtime 07/25/22   Dameron, Nolberto Hanlon, DO  LANTUS 100 UNIT/ML injection INJECT 5 UNITS SUBCUTANEOUSLY TWICE DAILY 01/16/23   Dameron, Nolberto Hanlon, DO  levothyroxine (SYNTHROID) 150 MCG tablet TAKE 1 TABLET BY MOUTH ONCE DAILY BEFORE BREAKFAST *APPOINTMENT NEEDED FOR FURTHER REFILLS* Patient taking differently: Take 150 mcg by mouth daily before breakfast. 09/26/22   Dameron, Nolberto Hanlon, DO  midodrine (PROAMATINE) 5 MG tablet Take 1 tablet (5 mg total) by mouth Every Tuesday,Thursday,and Saturday with dialysis. 05/12/22   Dameron, Nolberto Hanlon, DO  multivitamin (RENA-VIT) TABS tablet Take 1 tablet by mouth at bedtime. 05/11/22   Dameron, Nolberto Hanlon, DO  oxyCODONE (OXY IR/ROXICODONE) 5 MG immediate release tablet Take 1 tablet (5 mg total) by mouth every 12 (twelve)  hours as needed for severe pain (pain score 4-6). 02/15/23   Adonis Huguenin, NP  polyethylene glycol (MIRALAX / GLYCOLAX) 17 g packet Take 17 g by mouth daily as needed for mild constipation. 10/02/22   Dameron, Nolberto Hanlon, DO  rosuvastatin (CRESTOR) 20 MG tablet Take 1 tablet by mouth once daily 12/08/22   Dameron, Nolberto Hanlon, DO  Syringe, Disposable, (B-D SYRINGE SLIP TIP 30CC) 30 ML MISC 1 Syringe by Does not apply route daily. Patient not taking: Reported on 09/30/2022 01/31/19   Melene Plan, MD  zinc sulfate 220 (50 Zn) MG capsule Take 1 capsule (220 mg total) by mouth daily. 10/03/22   Darral Dash, DO     Family History  Problem Relation Age of Onset  Heart disease Mother    Hypertension Mother     Social History   Socioeconomic History   Marital status: Single    Spouse name: Not on file   Number of children: Not on file   Years of education: Not on file   Highest education level: Not on file  Occupational History   Not on file  Tobacco Use   Smoking status: Never   Smokeless tobacco: Never  Vaping Use   Vaping status: Never Used  Substance and Sexual Activity   Alcohol use: No   Drug use: Yes    Frequency: 2.0 times per week    Types: Marijuana    Comment: Last use was on 08/17/21   Sexual activity: Yes  Other Topics Concern   Not on file  Social History Narrative   Not on file   Social Determinants of Health   Financial Resource Strain: Not on file  Food Insecurity: No Food Insecurity (09/29/2022)   Hunger Vital Sign    Worried About Running Out of Food in the Last Year: Never true    Ran Out of Food in the Last Year: Never true  Transportation Needs: No Transportation Needs (09/29/2022)   PRAPARE - Administrator, Civil Service (Medical): No    Lack of Transportation (Non-Medical): No  Physical Activity: Insufficiently Active (07/07/2021)   Exercise Vital Sign    Days of Exercise per Week: 4 days    Minutes of Exercise per Session: 30 min  Stress:  Stress Concern Present (08/06/2021)   Paul [name]    Feeling of Stress : To some extent  Social Connections: Socially Isolated (06/07/2021)   Social Connection and Isolation Panel [NHANES]    Frequency of Communication with Friends and Family: More than three times a week    Frequency of Social Gatherings with Friends and Family: More than three times a week    Attends Religious Services: Never    Database administrator or Organizations: No    Attends Banker Meetings: Never    Marital Status: Never married     Review of Systems: A 12 point ROS discussed and pertinent positives are indicated in the HPI above.  All other systems are negative.  Review of Systems  Constitutional:  Negative for fever.  HENT:  Negative for congestion.   Respiratory:  Negative for cough and shortness of breath.   Cardiovascular:  Negative for chest pain.  Gastrointestinal:  Negative for abdominal pain.  Neurological:  Negative for headaches.  Psychiatric/Behavioral:  Negative for behavioral problems and confusion.     Vital Signs: BP 123/89   Pulse 97   Temp 98.2 F (36.8 C) (Oral)   Resp 19   Ht 6\' 2"  (1.88 m)   Wt (!) 325 lb (147.4 kg)   SpO2 97%   BMI 41.73 kg/m     Physical Exam Vitals and nursing note reviewed.  Constitutional:      Appearance: He is well-developed.  HENT:     Head: Normocephalic.  Cardiovascular:     Rate and Rhythm: Normal rate and regular rhythm.     Comments: RUE  AVF -/- Pulmonary:     Effort: Pulmonary effort is normal.  Musculoskeletal:        General: Normal range of motion.     Cervical back: Normal range of motion.  Skin:    General: Skin is dry.  Neurological:  Mental Status: He is alert and oriented to person, place, and time.     Imaging: No results found.  Labs:  CBC: Recent Labs    10/01/22 0026 10/01/22 1606 10/02/22 0021 11/26/22 1244  WBC 8.1 8.5  10.6* 10.4  HGB 13.3 12.3* 13.4 15.7  HCT 40.5 37.9* 41.4 48.2  PLT 242 241 261 361    COAGS: Recent Labs    09/29/22 1250  INR 1.8*  APTT 37*    BMP: Recent Labs    09/29/22 1219 09/30/22 0033 10/01/22 0026 11/26/22 1244  NA 133* 135 134* 135  K 3.8 3.6 3.6 4.0  CL 93* 93* 94* 96*  CO2 20* 24 22 18*  GLUCOSE 159* 187* 182* 134*  BUN 28* 40* 57* 51*  CALCIUM 9.6 9.0 8.1* 9.5  CREATININE 11.31* 12.87* 15.17* 15.47*  GFRNONAA 6* 5* 4* 4*    LIVER FUNCTION TESTS: Recent Labs    04/18/22 1014 04/19/22 0901 04/20/22 0608 05/05/22 0508 09/29/22 1219 09/30/22 0033  BILITOT 0.7 0.7  --   --  0.8  --   AST 12* 15  --   --  34  --   ALT 13 13  --   --  23  --   ALKPHOS 51 51  --   --  72  --   PROT 7.6 7.4  --   --  9.2*  --   ALBUMIN 4.1 3.9 4.2 3.7 4.4 3.3*      Assessment and Plan:  32 y.o. male outpatient. Well known to IR. Patient is well known to the interventional radiology service secondary to multiple previous declot procedures. Currently receiving dialysis through a left upper arm brachial artery to basilic vein AV fistula. Created on 2.10.23 with the second stage performed on 3.31.24.  The last intervention. performed at the 7.20.24. angioplasty of the venous anastomosis and majority of the venous limb to 7 mm diameter with angioplasty of the venous anastomosis and venous outflow of the 8 mm  AVG created on 2.10.23 with the second stage performed on 3.31.23. Most recent tunneled HD catheter was a  left external jugular dialysis catheter placed on 6.24.24.  Team is reporting the current access is clotted. Team is requesting fistulogram possible intervention. Possible tunneled HD catheter placement for hemodialysis access   Prior failed access includes LUE AVF placed  5..7.18, RUE AVG placed 2.11.22.   Last dialysis session performed on 9.24.24. No recent images. No recent labs. All medication are within acceptable parameters. No pertinent allergies. Patient has  been NPO since midnight.   Risks and benefits discussed with the patient including, but not limited to bleeding, infection, vascular injury, pulmonary embolism, need for tunneled HD catheter placement or even death.  All of the patient's questions were answered, patient is agreeable to proceed. Consent signed and in chart.   Thank you for this interesting consult.  I greatly enjoyed meeting Azaria Bartell and look forward to participating in their care.  A copy of this report was sent to the requesting provider on this date.  Electronically Signed: Alene Mires, NP 02/17/2023, 2:04 PM   I spent a total of    25 Minutes in face to face in clinical consultation, greater than 50% of which was counseling/coordinating care for Tunneled HD catheter placement

## 2023-02-17 ENCOUNTER — Other Ambulatory Visit (HOSPITAL_COMMUNITY): Payer: Self-pay | Admitting: Internal Medicine

## 2023-02-17 ENCOUNTER — Other Ambulatory Visit: Payer: Self-pay

## 2023-02-17 ENCOUNTER — Ambulatory Visit (HOSPITAL_COMMUNITY)
Admission: RE | Admit: 2023-02-17 | Discharge: 2023-02-17 | Disposition: A | Discharge status: Home / Self Care | Payer: Medicaid Other | Source: Ambulatory Visit | Attending: Internal Medicine | Admitting: Internal Medicine

## 2023-02-17 DIAGNOSIS — Y841 Kidney dialysis as the cause of abnormal reaction of the patient, or of later complication, without mention of misadventure at the time of the procedure: Secondary | ICD-10-CM | POA: Insufficient documentation

## 2023-02-17 DIAGNOSIS — Z794 Long term (current) use of insulin: Secondary | ICD-10-CM | POA: Insufficient documentation

## 2023-02-17 DIAGNOSIS — E1122 Type 2 diabetes mellitus with diabetic chronic kidney disease: Secondary | ICD-10-CM | POA: Insufficient documentation

## 2023-02-17 DIAGNOSIS — N186 End stage renal disease: Secondary | ICD-10-CM

## 2023-02-17 DIAGNOSIS — I120 Hypertensive chronic kidney disease with stage 5 chronic kidney disease or end stage renal disease: Secondary | ICD-10-CM | POA: Insufficient documentation

## 2023-02-17 DIAGNOSIS — Z992 Dependence on renal dialysis: Secondary | ICD-10-CM | POA: Diagnosis not present

## 2023-02-17 DIAGNOSIS — T82868A Thrombosis of vascular prosthetic devices, implants and grafts, initial encounter: Secondary | ICD-10-CM | POA: Insufficient documentation

## 2023-02-17 HISTORY — PX: IR FLUORO GUIDE CV LINE RIGHT: IMG2283

## 2023-02-17 HISTORY — PX: IR AV DIALY SHUNT INTRO NEEDLE/INTRAC INITIAL W/PTA/STENT/IMG LT: IMG6104

## 2023-02-17 HISTORY — PX: IR US GUIDE VASC ACCESS RIGHT: IMG2390

## 2023-02-17 LAB — GLUCOSE, CAPILLARY
Glucose-Capillary: 123 mg/dL — ABNORMAL HIGH (ref 70–99)
Glucose-Capillary: 112 mg/dL — ABNORMAL HIGH (ref 70–99)

## 2023-02-17 MED ORDER — IOHEXOL 300 MG/ML  SOLN
100.0000 mL | Freq: Once | INTRAMUSCULAR | Status: AC | PRN
  Administered 2023-02-17: 50 mL via INTRAVENOUS

## 2023-02-17 MED ORDER — HEPARIN SODIUM (PORCINE) 1000 UNIT/ML IJ SOLN
INTRAMUSCULAR | Status: AC | PRN
  Administered 2023-02-17: 5000 [IU] via INTRAVENOUS
  Administered 2023-02-17: 3000 [IU] via INTRAVENOUS

## 2023-02-17 MED ORDER — MIDAZOLAM HCL 2 MG/2ML IJ SOLN
INTRAMUSCULAR | Status: AC
  Filled 2023-02-17: qty 2

## 2023-02-17 MED ORDER — FENTANYL CITRATE (PF) 100 MCG/2ML IJ SOLN
INTRAMUSCULAR | Status: AC
  Filled 2023-02-17: qty 2

## 2023-02-17 MED ORDER — LIDOCAINE HCL (PF) 1 % IJ SOLN
20.0000 mL | Freq: Once | INTRAMUSCULAR | Status: AC
  Administered 2023-02-17: 20 mL via INTRADERMAL

## 2023-02-17 MED ORDER — ALTEPLASE 2 MG IJ SOLR
INTRAMUSCULAR | Status: AC
  Filled 2023-02-17: qty 2

## 2023-02-17 MED ORDER — FENTANYL CITRATE (PF) 100 MCG/2ML IJ SOLN
INTRAMUSCULAR | Status: AC | PRN
  Administered 2023-02-17: 25 ug via INTRAVENOUS
  Administered 2023-02-17: 50 ug via INTRAVENOUS
  Administered 2023-02-17 (×5): 25 ug via INTRAVENOUS

## 2023-02-17 MED ORDER — HEPARIN SODIUM (PORCINE) 1000 UNIT/ML IJ SOLN
INTRAMUSCULAR | Status: AC
  Filled 2023-02-17: qty 10

## 2023-02-17 MED ORDER — ALTEPLASE 2 MG IJ SOLR
INTRAMUSCULAR | Status: AC
  Filled 2023-02-17: qty 4

## 2023-02-17 MED ORDER — APIXABAN 5 MG PO TABS
5.0000 mg | ORAL_TABLET | Freq: Once | ORAL | Status: DC
  Filled 2023-02-17: qty 1

## 2023-02-17 MED ORDER — MIDAZOLAM HCL 2 MG/2ML IJ SOLN
INTRAMUSCULAR | Status: AC | PRN
  Administered 2023-02-17 (×4): .5 mg via INTRAVENOUS
  Administered 2023-02-17: 1 mg via INTRAVENOUS
  Administered 2023-02-17 (×2): .5 mg via INTRAVENOUS

## 2023-02-17 MED ORDER — ALTEPLASE 30 MG/30 ML FOR INTERV. RAD
INTRA_ARTERIAL | Status: AC | PRN
  Administered 2023-02-17: 4 mg via INTRA_ARTERIAL

## 2023-02-17 MED ORDER — LIDOCAINE-EPINEPHRINE 1 %-1:100000 IJ SOLN
20.0000 mL | Freq: Once | INTRAMUSCULAR | Status: AC
  Administered 2023-02-17: 20 mL via INTRADERMAL

## 2023-02-17 MED ORDER — LIDOCAINE HCL 1 % IJ SOLN
INTRAMUSCULAR | Status: AC
  Filled 2023-02-17: qty 20

## 2023-02-17 MED ORDER — GELATIN ABSORBABLE 12-7 MM EX MISC
CUTANEOUS | Status: AC
  Filled 2023-02-17: qty 1

## 2023-02-17 MED ORDER — CEFAZOLIN SODIUM-DEXTROSE 2-4 GM/100ML-% IV SOLN
INTRAVENOUS | Status: AC
  Filled 2023-02-17: qty 100

## 2023-02-17 MED ORDER — LIDOCAINE-EPINEPHRINE 1 %-1:100000 IJ SOLN
INTRAMUSCULAR | Status: AC
  Filled 2023-02-17: qty 1

## 2023-02-17 MED ORDER — SODIUM CHLORIDE 0.9 % IV SOLN: INTRAVENOUS | Status: DC

## 2023-02-17 MED ORDER — ALTEPLASE 30 MG/30 ML FOR INTERV. RAD
INTRA_ARTERIAL | Status: AC | PRN
  Administered 2023-02-17: 1 mg via INTRA_ARTERIAL

## 2023-02-17 MED ORDER — ALTEPLASE 30 MG/30 ML FOR INTERV. RAD
INTRA_ARTERIAL | Status: AC | PRN
  Administered 2023-02-17: 3 mg via INTRA_ARTERIAL

## 2023-02-17 MED ORDER — CEFAZOLIN SODIUM-DEXTROSE 2-4 GM/100ML-% IV SOLN
INTRAVENOUS | Status: AC | PRN
Start: 2023-02-17 — End: 2023-02-17
  Administered 2023-02-17: 2 g via INTRAVENOUS

## 2023-02-17 NOTE — Procedures (Signed)
Interventional Radiology Procedure Note    Procedure: US guided access LUE graft x 2 Attempt at mechanical/pharmacologic thrombectomy, with PTA and stenting of outflow stenosis.  Failed to open with recurrent thrombosis throughout.  Arterial embolism/thrombosis was treated with pharm thrombolysis and fogarty balloon.   Plan to proceed now with tunneled HD catheter.   Findings: Thrombosed on initial. Thrombosed on final, after attempt at PTA and stenting.  Arterial embo was treated, with restoration of 2 arteries to the hand.  Doppler positive at the hand at conclusion. Motor/sensory intact.   Complications: None  Recommendations:  - Will proceed with tunneled HD catheter.  - Stay sutures can be removed at the next dialysis session.  - Routine wound care - Patient advised to take his eliquis dose at home tonight.   Signed,  Yvone Neu. Loreta Ave, DO

## 2023-02-17 NOTE — Procedures (Addendum)
Interventional Radiology Procedure Note  Procedure:   Placement of a right external jugular approach tunneled HD cath. Tip is positioned at the superior cavoatrial junction and ready for use.   Complications: None  Recommendations:  - Ok to use catheter - Do not submerge - Routine line care  - DC 1 hr after sedation recovery - advance diet - advise give dose of home eliquis (due at 4pm, patient states skipped both doses today)  Signed,  Yvone Neu. Loreta Ave, DO, ABVM, RPVI

## 2023-02-20 DIAGNOSIS — E1129 Type 2 diabetes mellitus with other diabetic kidney complication: Secondary | ICD-10-CM | POA: Diagnosis not present

## 2023-02-20 DIAGNOSIS — N186 End stage renal disease: Secondary | ICD-10-CM | POA: Diagnosis not present

## 2023-02-20 DIAGNOSIS — Z992 Dependence on renal dialysis: Secondary | ICD-10-CM | POA: Diagnosis not present

## 2023-02-21 ENCOUNTER — Encounter (HOSPITAL_COMMUNITY): Payer: Self-pay

## 2023-02-21 ENCOUNTER — Other Ambulatory Visit (HOSPITAL_COMMUNITY): Payer: Self-pay | Admitting: Internal Medicine

## 2023-02-21 DIAGNOSIS — N186 End stage renal disease: Secondary | ICD-10-CM

## 2023-02-21 HISTORY — PX: IR US GUIDE VASC ACCESS LEFT: IMG2389

## 2023-02-22 ENCOUNTER — Ambulatory Visit (HOSPITAL_COMMUNITY): Payer: Medicaid Other

## 2023-02-22 ENCOUNTER — Other Ambulatory Visit: Payer: Self-pay | Admitting: Student

## 2023-02-22 DIAGNOSIS — Z794 Long term (current) use of insulin: Secondary | ICD-10-CM

## 2023-02-22 LAB — POCT I-STAT, CHEM 8
BUN: 63 mg/dL — ABNORMAL HIGH (ref 6–20)
Calcium, Ion: 1.12 mmol/L — ABNORMAL LOW (ref 1.15–1.40)
Chloride: 104 mmol/L (ref 98–111)
Creatinine, Ser: >18 mg/dL — ABNORMAL HIGH (ref 0.61–1.24)
Glucose, Bld: 112 mg/dL — ABNORMAL HIGH (ref 70–99)
HCT: 38 % — ABNORMAL LOW (ref 39.0–52.0)
Hemoglobin: 12.9 g/dL — ABNORMAL LOW (ref 13.0–17.0)
Potassium: 4.8 mmol/L (ref 3.5–5.1)
Sodium: 137 mmol/L (ref 135–145)
TCO2: 19 mmol/L — ABNORMAL LOW (ref 22–32)

## 2023-02-24 ENCOUNTER — Ambulatory Visit: Payer: Medicaid Other

## 2023-02-24 ENCOUNTER — Ambulatory Visit (HOSPITAL_COMMUNITY): Payer: Medicaid Other | Attending: Vascular Surgery

## 2023-03-01 ENCOUNTER — Ambulatory Visit: Payer: Medicaid Other | Admitting: Family

## 2023-03-01 ENCOUNTER — Encounter: Payer: Self-pay | Admitting: Family

## 2023-03-01 DIAGNOSIS — I739 Peripheral vascular disease, unspecified: Secondary | ICD-10-CM | POA: Diagnosis not present

## 2023-03-01 DIAGNOSIS — T8781 Dehiscence of amputation stump: Secondary | ICD-10-CM

## 2023-03-01 DIAGNOSIS — Z89432 Acquired absence of left foot: Secondary | ICD-10-CM | POA: Diagnosis not present

## 2023-03-01 MED ORDER — OXYCODONE HCL 5 MG PO TABS: 5.0000 mg | ORAL_TABLET | Freq: Two times a day (BID) | ORAL | 0 refills | Status: DC | PRN

## 2023-03-01 NOTE — Addendum Note (Signed)
Addended by: Barnie Del R on: 03/01/2023 11:49 AM   Modules accepted: Orders

## 2023-03-01 NOTE — Progress Notes (Signed)
Office Visit Note   Patient: Paul [name]           Date of Birth: [birth year]           MRN: [id number] Visit Date: 03/01/2023              Requested by: Darral Dash, DO [address] [place],  [place] [address] PCP: Darral Dash, DO  Chief Complaint  Patient presents with   Left Foot - Follow-up    10/01/2022 left 1st ray amputation       HPI: The patient is a 32 year old gentleman who is seen status post left first ray amputation unfortunately had some dehiscence and we have been routinely parring the callus buildup  Has been doing dry dressing changes  Assessment & Plan: Visit Diagnoses: No diagnosis found.  Plan: Will continue with his current wound care pack the area with depth open.  Follow-Up Instructions: No follow-ups on file.   Ortho Exam  Patient is alert, oriented, no adenopathy, well-dressed, normal affect, normal respiratory effort. On examination of the left foot the ray amputation shows marked improvement.  Proximally to be incision there is an area which is 2 cm in length by half millimeter in width with 5 mm of depth.  There is significant hyperkeratotic tissue nonviable tissue this was debrided with a 10 blade knife back to viable tissue.  There is some epiboly however there is no sign of infection  Imaging: No results found. No images are attached to the encounter.  Labs: Lab Results  Component Value Date   HGBA1C 7.4 (H) 09/30/2022   HGBA1C 6.3 (H) 04/18/2022   HGBA1C 6.2 09/22/2021   REPTSTATUS 10/06/2022 FINAL 10/01/2022   GRAMSTAIN  10/01/2022    FEW WBC PRESENT, PREDOMINANTLY PMN RARE GRAM POSITIVE COCCI RARE SQUAMOUS EPITHELIAL CELLS PRESENT    CULT  10/01/2022    RARE ESCHERICHIA COLI RARE STENOTROPHOMONAS MALTOPHILIA NO ANAEROBES ISOLATED Performed at [facility], [address], [place], [place] [address]    [facility] ESCHERICHIA COLI 10/01/2022   LABORGA STENOTROPHOMONAS MALTOPHILIA 10/01/2022     Lab Results   Component Value Date   ALBUMIN 3.3 (L) 09/30/2022   ALBUMIN 4.4 09/29/2022   ALBUMIN 3.7 05/05/2022    Lab Results  Component Value Date   MG 2.8 (H) 09/26/2016   MG 2.8 (H) 09/25/2016   MG 2.8 (H) 09/24/2016   Lab Results  Component Value Date   VD25OH 6.8 (L) 09/14/2016    No results found for: "PREALBUMIN"    Latest Ref Rng & Units 02/17/2023    4:24 PM 11/26/2022   12:44 PM 10/02/2022   12:21 AM  CBC EXTENDED  WBC 4.0 - 10.5 K/uL  10.4  10.6   RBC 4.22 - 5.81 MIL/uL  5.86  5.15   Hemoglobin 13.0 - 17.0 g/dL 86.5  78.4  69.6   HCT 39.0 - 52.0 % 38.0  48.2  41.4   Platelets 150 - 400 K/uL  361  261      There is no height or weight on file to calculate BMI.  Orders:  No orders of the defined types were placed in this encounter.  No orders of the defined types were placed in this encounter.    Procedures: No procedures performed  Clinical Data: No additional findings.  ROS:  All other systems negative, except as noted in the HPI. Review of Systems  Objective: Vital Signs: There were no vitals taken for this visit.  Specialty Comments:  No specialty comments available.  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   Thrombus 05/04/2022   Ischemia of left upper extremity 05/04/2022   Complication of vascular access for dialysis 04/19/2022   ESRD (end stage renal disease) (HCC) 04/19/2022   Failure of hemodialysis access (HCC) 04/18/2022   Left hand pain 09/22/2021   Diabetic foot infection (HCC)    Wound infection 05/07/2021   MSSA bacteremia 05/07/2021   Osteomyelitis (HCC) 05/07/2021   Abscess 05/07/2021   Sepsis due to cellulitis (HCC) 05/06/2021   Poor compliance with medication 03/17/2021   Fluid overload, unspecified 07/09/2020   Complication of vascular dialysis catheter 03/23/2020    Other disorders of phosphorus metabolism 01/28/2020   Hypertension 01/08/2020   Hypocalcemia 10/29/2019   ESRD on dialysis (HCC) 11/16/2017   Pruritus, unspecified 04/04/2017   Unspecified protein-calorie malnutrition (HCC) 11/11/2016   Headache, unspecified 10/29/2016   Encounter for immunization 10/19/2016   Iron deficiency anemia, unspecified 10/10/2016   Anemia in chronic kidney disease 09/30/2016   Coagulation defect, unspecified (HCC) 09/30/2016   Pneumonia due to Pseudomonas (HCC) 09/30/2016   Secondary hyperparathyroidism of renal origin (HCC) 09/30/2016   Hypothyroidism 10/29/2009   Type 2 diabetes mellitus (HCC) 04/14/2009   Hyperlipidemia 04/14/2009   OBESITY, MORBID 04/14/2009   Essential hypertension, benign 04/14/2009   Past Medical History:  Diagnosis Date   Acute hypoxemic respiratory failure (HCC) 09/11/2016   Anemia    Chronic kidney disease    ARF on CRF Dialysis T/TH/Sa   Diabetes mellitus    Type II   End stage renal disease on dialysis (HCC)    [place]    GERD (gastroesophageal reflux disease)    diet controlled   HLD (hyperlipidemia)    Hypertension    Hypothyroidism    Morbid obesity (HCC)    Sacral wound    resolved   Thyroid disease     Family History  Problem Relation Age of Onset   Heart disease Mother    Hypertension Mother     Past Surgical History:  Procedure Laterality Date   AMPUTATION Left 05/08/2021   Procedure: AMPUTATION GREAT TOE;  Surgeon: Nadara Mustard, MD;  Location: [facility] OR;  Service: Orthopedics;  Laterality: Left;   AMPUTATION Left 10/01/2022   Procedure: LEFT FOOT AMPUTATION RAY 1st;  Surgeon: Nadara Mustard, MD;  Location: [facility] OR;  Service: Orthopedics;  Laterality: Left;   AV FISTULA PLACEMENT Left 09/26/2016   Procedure: LEFT UPPER ARM ARTERIOVENOUS (AV) FISTULA CREATION;  Surgeon: Sherren Kerns, MD;  Location: [facility] OR;  Service: Vascular;  Laterality: Left;   AV FISTULA PLACEMENT Left 12/02/2016   Procedure:  INSERTION OF ARTERIOVENOUS GORE-TEX GRAFT LEFT UPPER  ARM USING A 4-7MM BY 45CM GRAFT ;  Surgeon: Larina Earthly, MD;  Location: [facility] OR;  Service: Vascular;  Laterality: Left;   AV FISTULA PLACEMENT Right 07/03/2020   Procedure: INSERTION OF ARTERIOVENOUS (AV) GORE-TEX GRAFT RIGHT UPPER ARM;  Surgeon: Cephus Shelling, MD;  Location: [facility] OR;  Service: Vascular;  Laterality: Right;   AV FISTULA PLACEMENT Left 07/02/2021   Procedure: LEFT UPPER EXTREMITY ARTERIOVENOUS FISTULA CREATION;  Surgeon: Maeola Harman, MD;  Location[facility] OR;  Service: Vascular;  Laterality: Left;   BASCILIC VEIN TRANSPOSITION Right 05/06/2020   Procedure: FIRST STAGE RIGHT BASCILIC VEIN  TRANSPOSITION;  Surgeon: Nada Libman, MD;  Location: [facility] OR;  Service: Vascular;  Laterality: Right;   BASCILIC VEIN TRANSPOSITION Left 08/20/2021   Procedure: LEFT ARM SECOND STAGE WITH ARTERIOVENOUS GRAFT;  Surgeon: Maeola Harman, MD;  Location: [facility] OR;  Service: Vascular;  Laterality: Left;   EXCHANGE OF A DIALYSIS CATHETER Right 10/08/2016   Procedure: EXCHANGE OF A DIALYSIS CATHETER-RIGHT INTERNAL JUGULAR;  Surgeon: Fransisco Hertz, MD;  Location: [facility] OR;  Service: Vascular;  Laterality: Right;   INSERTION OF DIALYSIS CATHETER Right 09/26/2016   Procedure: INSERTION OF DIALYSIS CATHETER - Right Internal Jugular Placement;  Surgeon: Sherren Kerns, MD;  Location: [facility] OR;  Service: Vascular;  Laterality: Right;   INSERTION OF DIALYSIS CATHETER Left 03/22/2018   Procedure: INSERTION OF DIALYSIS CATHETER;  Surgeon: Cephus Shelling, MD;  Location: [facility] OR;  Service: Vascular;  Laterality: Left;   INTRAOPERATIVE ARTERIOGRAM Left 05/04/2022   Procedure: INTRA OPERATIVE ARTERIOGRAM;  Surgeon: Victorino Sparrow, MD;  Location: [facility] OR;  Service: Vascular;  Laterality: Left;   IR AV DIALY SHUNT INTRO NEEDLE/INTRAC INITIAL W/PTA/STENT/IMG LT Left 02/17/2023   IR AV DIALY SHUNT INTRO NEEDLE/INTRACATH INITIAL W/PTA/IMG LEFT   08/19/2022   IR AV DIALY SHUNT INTRO NEEDLE/INTRACATH INITIAL W/PTA/IMG LEFT  11/14/2022   IR DIALY SHUNT INTRO NEEDLE/INTRACATH INITIAL W/IMG LEFT Left 04/15/2022   IR FLUORO GUIDE CV LINE LEFT  04/19/2022   IR FLUORO GUIDE CV LINE LEFT  05/30/2022   IR FLUORO GUIDE CV LINE RIGHT  03/26/2021   IR FLUORO GUIDE CV LINE RIGHT  05/13/2021   IR FLUORO GUIDE CV LINE RIGHT  05/04/2022   IR FLUORO GUIDE CV LINE RIGHT  02/17/2023   IR REMOVAL TUN CV CATH W/O FL  05/11/2021   IR REMOVAL TUN CV CATH W/O FL  10/06/2021   IR THROMBECTOMY AV FISTULA W/THROMBOLYSIS/PTA INC/SHUNT/IMG LEFT Left 02/02/2022   IR THROMBECTOMY AV FISTULA W/THROMBOLYSIS/PTA INC/SHUNT/IMG LEFT Left 03/22/2022   IR THROMBECTOMY AV FISTULA W/THROMBOLYSIS/PTA INC/SHUNT/IMG LEFT Left 05/04/2022   IR THROMBECTOMY AV FISTULA W/THROMBOLYSIS/PTA INC/SHUNT/IMG LEFT Left 12/09/2022   IR THROMBECTOMY AV FISTULA W/THROMBOLYSIS/PTA/STENT INC/SHUNT/IMG LT Left 04/27/2022   IR US GUIDE VASC ACCESS LEFT  02/02/2022   IR US GUIDE VASC ACCESS LEFT  03/22/2022   IR US GUIDE VASC ACCESS LEFT  04/15/2022   IR US GUIDE VASC ACCESS LEFT  04/19/2022   IR US GUIDE VASC ACCESS LEFT  04/27/2022   IR US GUIDE VASC ACCESS LEFT  12/13/2022   IR US GUIDE VASC ACCESS LEFT  02/21/2023   IR US GUIDE VASC ACCESS RIGHT  03/26/2021   IR US GUIDE VASC ACCESS RIGHT  05/13/2021   IR US GUIDE VASC ACCESS RIGHT  05/04/2022   IR US GUIDE VASC ACCESS RIGHT  02/17/2023   REVISON OF ARTERIOVENOUS FISTULA Left 03/22/2018   Procedure: LEFT UPPER EXTREMITY ARTERIOVENOUS  REVISION WITH GORE-TEX GRAFT.;  Surgeon: Cephus Shelling, MD;  Location: [facility] OR;  Service: Vascular;  Laterality: Left;   TEE WITHOUT CARDIOVERSION N/A 05/12/2021   Procedure: TRANSESOPHAGEAL ECHOCARDIOGRAM (TEE);  Surgeon: Christell Constant, MD;  Location: [facility] ENDOSCOPY;  Service: Cardiovascular;  Laterality: N/A;   THROMBECTOMY Left 08/26/2019   thrombectomy of LUA loop AVG   THROMBECTOMY AND REVISION OF  ARTERIOVENTOUS (AV) GORETEX  GRAFT Right 07/27/2020   Procedure: INSERTION OF RIGHT ARM LOOP GRAFT WITH EXCISION OF RIGHT ARM BRACHIAL AXILLARY GRAFT;  Surgeon: Cephus Shelling, MD;  Location: [facility] OR;  Service: Vascular;  Laterality: Right;   THROMBECTOMY BRACHIAL ARTERY Left 05/04/2022   Procedure: LEFT ARM RADIAL BRACHIAL ARTERY EMBOLECTOMY;  Surgeon: Victorino Sparrow, MD;  Location: [facility] OR;  Service: Vascular;  Laterality: Left;   UPPER EXTREMITY VENOGRAPHY Bilateral 06/14/2021   Procedure: UPPER EXTREMITY VENOGRAPHY;  Surgeon: Maeola Harman, MD;  Location: [facility] INVASIVE CV LAB;  Service: Cardiovascular;  Laterality: Bilateral;   VISCERAL ANGIOGRAPHY Right 06/17/2020   Procedure: CENTRAL VENO;  Surgeon: Leonie Douglas, MD;  Location: [facility] INVASIVE CV LAB;  Service: Cardiovascular;  Laterality: Right;   Social History   Occupational History   Not on file  Tobacco Use   Smoking status: Never   Smokeless tobacco: Never  Vaping Use   Vaping status: Never Used  Substance and Sexual Activity   Alcohol use: No   Drug use: Yes    Frequency: 2.0 times per week    Types: Marijuana    Comment: Last use was on 08/17/21   Sexual activity: Yes

## 2023-03-07 NOTE — Addendum Note (Signed)
Encounter addended by: Mady Gemma, [facility] on: 03/07/2023 4:50 PM  Actions taken: Order Reconciliation Section accessed

## 2023-03-14 NOTE — Addendum Note (Signed)
Encounter addended by: Loretta Doutt H on: 03/14/2023 9:08 AM  Actions taken: Imaging Exam ended

## 2023-03-15 ENCOUNTER — Encounter: Payer: Self-pay | Admitting: Family

## 2023-03-15 ENCOUNTER — Ambulatory Visit: Payer: Medicaid Other | Admitting: Family

## 2023-03-15 DIAGNOSIS — Z89432 Acquired absence of left foot: Secondary | ICD-10-CM | POA: Diagnosis not present

## 2023-03-15 DIAGNOSIS — T8781 Dehiscence of amputation stump: Secondary | ICD-10-CM

## 2023-03-15 NOTE — Progress Notes (Signed)
Post-Op Visit Note   Patient: Paul [name]           Date of Birth: June 29, 1990           MRN: [id number] Visit Date: 03/15/2023 PCP: Darral Dash, DO  Chief Complaint:  Chief Complaint  Patient presents with   Left Foot - Follow-up    10/01/2022 left foot 1st ray amputation     HPI:  HPI The patient is a 32 year old gentleman seen status post left first ray amputation we have been routinely trimming the callus buildup Ortho Exam On examination of the left foot the ray amputation continues to improve after debridement of hyperkeratotic tissue with a 10 blade knife there are 2 remaining open areas with some surrounding maceration these are 5 mm in diameter 5 mm deep each there is no surrounding drainage or cellulitis  Visit Diagnoses: No diagnosis found.  Plan: 2 areas packed open with silver cell.  He will continue the same and follow-up in 2 weeks  Follow-Up Instructions: No follow-ups on file.   Imaging: No results found.  Orders:  No orders of the defined types were placed in this encounter.  No orders of the defined types were placed in this encounter.    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   Thrombus 05/04/2022   Ischemia of left upper extremity 05/04/2022   Complication of vascular access for dialysis 04/19/2022   ESRD (end stage renal disease) (HCC) 04/19/2022   Failure of hemodialysis access (HCC) 04/18/2022   Left hand pain 09/22/2021   Diabetic foot infection (HCC)    Wound infection 05/07/2021   MSSA bacteremia 05/07/2021   Osteomyelitis (HCC) 05/07/2021   Abscess 05/07/2021   Sepsis due to cellulitis (HCC) 05/06/2021   Poor compliance with medication 03/17/2021   Fluid overload, unspecified 07/09/2020   Complication of vascular dialysis catheter 03/23/2020   Other disorders of phosphorus  metabolism 01/28/2020   Hypertension 01/08/2020   Hypocalcemia 10/29/2019   ESRD on dialysis (HCC) 11/16/2017   Pruritus, unspecified 04/04/2017   Unspecified protein-calorie malnutrition (HCC) 11/11/2016   Headache, unspecified 10/29/2016   Encounter for immunization 10/19/2016   Iron deficiency anemia, unspecified 10/10/2016   Anemia in chronic kidney disease 09/30/2016   Coagulation defect, unspecified (HCC) 09/30/2016   Pneumonia due to Pseudomonas (HCC) 09/30/2016   Secondary hyperparathyroidism of renal origin (HCC) 09/30/2016   Hypothyroidism 10/29/2009   Type 2 diabetes mellitus (HCC) 04/14/2009   Hyperlipidemia 04/14/2009   OBESITY, MORBID 04/14/2009   Essential hypertension, benign 04/14/2009   Past Medical History:  Diagnosis Date   Acute hypoxemic respiratory failure (HCC) 09/11/2016   Anemia    Chronic kidney disease    ARF on CRF Dialysis T/TH/Sa   Diabetes mellitus    Type II   End stage renal disease on dialysis (HCC)    [place]    GERD (gastroesophageal reflux disease)    diet controlled   HLD (hyperlipidemia)    Hypertension    Hypothyroidism    Morbid obesity (HCC)    Sacral wound    resolved   Thyroid disease     Family History  Problem Relation Age of Onset   Heart disease Mother    Hypertension Mother     Past Surgical History:  Procedure Laterality Date   AMPUTATION Left 05/08/2021   Procedure:  AMPUTATION GREAT TOE;  Surgeon: Nadara Mustard, MD;  Location: [facility] OR;  Service: Orthopedics;  Laterality: Left;   AMPUTATION Left 10/01/2022   Procedure: LEFT FOOT AMPUTATION RAY 1st;  Surgeon: Nadara Mustard, MD;  Location: [facility] OR;  Service: Orthopedics;  Laterality: Left;   AV FISTULA PLACEMENT Left 09/26/2016   Procedure: LEFT UPPER ARM ARTERIOVENOUS (AV) FISTULA CREATION;  Surgeon: Sherren Kerns, MD;  Location: [facility] OR;  Service: Vascular;  Laterality: Left;   AV FISTULA PLACEMENT Left 12/02/2016   Procedure: INSERTION OF ARTERIOVENOUS  GORE-TEX GRAFT LEFT UPPER  ARM USING A 4-7MM BY 45CM GRAFT ;  Surgeon: Larina Earthly, MD;  Location: [facility] OR;  Service: Vascular;  Laterality: Left;   AV FISTULA PLACEMENT Right 07/03/2020   Procedure: INSERTION OF ARTERIOVENOUS (AV) GORE-TEX GRAFT RIGHT UPPER ARM;  Surgeon: Cephus Shelling, MD;  Location: [facility] OR;  Service: Vascular;  Laterality: Right;   AV FISTULA PLACEMENT Left 07/02/2021   Procedure: LEFT UPPER EXTREMITY ARTERIOVENOUS FISTULA CREATION;  Surgeon: Maeola Harman, MD;  Location: [facility] OR;  Service: Vascular;  Laterality: Left;   BASCILIC VEIN TRANSPOSITION Right 05/06/2020   Procedure: FIRST STAGE RIGHT BASCILIC VEIN TRANSPOSITION;  Surgeon: Nada Libman, MD;  Location: [facility] OR;  Service: Vascular;  Laterality: Right;   BASCILIC VEIN TRANSPOSITION Left 08/20/2021   Procedure: LEFT ARM SECOND STAGE WITH ARTERIOVENOUS GRAFT;  Surgeon: Maeola Harman, MD;  Location: [facility] OR;  Service: Vascular;  Laterality: Left;   EXCHANGE OF A DIALYSIS CATHETER Right 10/08/2016   Procedure: EXCHANGE OF A DIALYSIS CATHETER-RIGHT INTERNAL JUGULAR;  Surgeon: Fransisco Hertz, MD;  Location: [facility] OR;  Service: Vascular;  Laterality: Right;   INSERTION OF DIALYSIS CATHETER Right 09/26/2016   Procedure: INSERTION OF DIALYSIS CATHETER - Right Internal Jugular Placement;  Surgeon: Sherren Kerns, MD;  Location: [facility] OR;  Service: Vascular;  Laterality: Right;   INSERTION OF DIALYSIS CATHETER Left 03/22/2018   Procedure: INSERTION OF DIALYSIS CATHETER;  Surgeon: Cephus Shelling, MD;  Location: [facility] OR;  Service: Vascular;  Laterality: Left;   INTRAOPERATIVE ARTERIOGRAM Left 05/04/2022   Procedure: INTRA OPERATIVE ARTERIOGRAM;  Surgeon: Victorino Sparrow, MD;  Location: [facility] OR;  Service: Vascular;  Laterality: Left;   IR AV DIALY SHUNT INTRO NEEDLE/INTRAC INITIAL W/PTA/STENT/IMG LT Left 02/17/2023   IR AV DIALY SHUNT INTRO NEEDLE/INTRACATH INITIAL W/PTA/IMG LEFT  08/19/2022   IR AV DIALY SHUNT  INTRO NEEDLE/INTRACATH INITIAL W/PTA/IMG LEFT  11/14/2022   IR DIALY SHUNT INTRO NEEDLE/INTRACATH INITIAL W/IMG LEFT Left 04/15/2022   IR FLUORO GUIDE CV LINE LEFT  04/19/2022   IR FLUORO GUIDE CV LINE LEFT  05/30/2022   IR FLUORO GUIDE CV LINE RIGHT  03/26/2021   IR FLUORO GUIDE CV LINE RIGHT  05/13/2021   IR FLUORO GUIDE CV LINE RIGHT  05/04/2022   IR FLUORO GUIDE CV LINE RIGHT  02/17/2023   IR REMOVAL TUN CV CATH W/O FL  05/11/2021   IR REMOVAL TUN CV CATH W/O FL  10/06/2021   IR THROMBECTOMY AV FISTULA W/THROMBOLYSIS/PTA INC/SHUNT/IMG LEFT Left 02/02/2022   IR THROMBECTOMY AV FISTULA W/THROMBOLYSIS/PTA INC/SHUNT/IMG LEFT Left 03/22/2022   IR THROMBECTOMY AV FISTULA W/THROMBOLYSIS/PTA INC/SHUNT/IMG LEFT Left 05/04/2022   IR THROMBECTOMY AV FISTULA W/THROMBOLYSIS/PTA INC/SHUNT/IMG LEFT Left 12/09/2022   IR THROMBECTOMY AV FISTULA W/THROMBOLYSIS/PTA/STENT INC/SHUNT/IMG LT Left 04/27/2022   IR US GUIDE VASC ACCESS LEFT  02/02/2022   IR US GUIDE VASC ACCESS LEFT  03/22/2022   IR US GUIDE  VASC ACCESS LEFT  04/15/2022   IR US GUIDE VASC ACCESS LEFT  04/19/2022   IR US GUIDE VASC ACCESS LEFT  04/27/2022   IR US GUIDE VASC ACCESS LEFT  12/13/2022   IR US GUIDE VASC ACCESS LEFT  02/21/2023   IR US GUIDE VASC ACCESS RIGHT  03/26/2021   IR US GUIDE VASC ACCESS RIGHT  05/13/2021   IR US GUIDE VASC ACCESS RIGHT  05/04/2022   IR US GUIDE VASC ACCESS RIGHT  02/17/2023   REVISON OF ARTERIOVENOUS FISTULA Left 03/22/2018   Procedure: LEFT UPPER EXTREMITY ARTERIOVENOUS  REVISION WITH GORE-TEX GRAFT.;  Surgeon: Cephus Shelling, MD;  Location: [facility] OR;  Service: Vascular;  Laterality: Left;   TEE WITHOUT CARDIOVERSION N/A 05/12/2021   Procedure: TRANSESOPHAGEAL ECHOCARDIOGRAM (TEE);  Surgeon: Christell Constant, MD;  Location: [facility] ENDOSCOPY;  Service: Cardiovascular;  Laterality: N/A;   THROMBECTOMY Left 08/26/2019   thrombectomy of LUA loop AVG   THROMBECTOMY AND REVISION OF ARTERIOVENTOUS (AV) GORETEX  GRAFT  Right 07/27/2020   Procedure: INSERTION OF RIGHT ARM LOOP GRAFT WITH EXCISION OF RIGHT ARM BRACHIAL AXILLARY GRAFT;  Surgeon: Cephus Shelling, MD;  Location: [facility] OR;  Service: Vascular;  Laterality: Right;   THROMBECTOMY BRACHIAL ARTERY Left 05/04/2022   Procedure: LEFT ARM RADIAL BRACHIAL ARTERY EMBOLECTOMY;  Surgeon: Victorino Sparrow, MD;  Location: [facility] OR;  Service: Vascular;  Laterality: Left;   UPPER EXTREMITY VENOGRAPHY Bilateral 06/14/2021   Procedure: UPPER EXTREMITY VENOGRAPHY;  Surgeon: Maeola Harman, MD;  Location: [facility] INVASIVE CV LAB;  Service: Cardiovascular;  Laterality: Bilateral;   VISCERAL ANGIOGRAPHY Right 06/17/2020   Procedure: CENTRAL VENO;  Surgeon: Leonie Douglas, MD;  Location: [facility] INVASIVE CV LAB;  Service: Cardiovascular;  Laterality: Right;   Social History   Occupational History   Not on file  Tobacco Use   Smoking status: Never   Smokeless tobacco: Never  Vaping Use   Vaping status: Never Used  Substance and Sexual Activity   Alcohol use: No   Drug use: Yes    Frequency: 2.0 times per week    Types: Marijuana    Comment: Last use was on 08/17/21   Sexual activity: Yes

## 2023-03-17 ENCOUNTER — Telehealth: Payer: Self-pay | Admitting: Family

## 2023-03-17 NOTE — Telephone Encounter (Signed)
Pt requesting refill on Oxycodone please advise, pt is aware Denny Peon and Lajoyce Corners are not in office currently, stated he called in Wednesday and was told Denny Peon is no longer in office for the day and to call back the next day. Pt did not know who he spoke with said she sounded young

## 2023-03-20 ENCOUNTER — Other Ambulatory Visit: Payer: Self-pay | Admitting: Student

## 2023-03-20 ENCOUNTER — Other Ambulatory Visit: Payer: Self-pay | Admitting: Orthopedic Surgery

## 2023-03-20 ENCOUNTER — Telehealth: Payer: Self-pay | Admitting: Orthopedic Surgery

## 2023-03-20 DIAGNOSIS — E785 Hyperlipidemia, unspecified: Secondary | ICD-10-CM

## 2023-03-20 MED ORDER — OXYCODONE HCL 5 MG PO TABS: 5.0000 mg | ORAL_TABLET | Freq: Two times a day (BID) | ORAL | 0 refills | Status: DC | PRN

## 2023-03-20 NOTE — Telephone Encounter (Signed)
S/p left 1st ray amputation on 10/01/22 Oxycodone 5mg  last filled 03/01/23 #14

## 2023-03-20 NOTE — Telephone Encounter (Signed)
From 9/11 to 10/9 he has had 35 tablets  And older refills from closer to his surgery date in May, #15 per refill

## 2023-03-20 NOTE — Telephone Encounter (Signed)
This has already been addressed

## 2023-03-20 NOTE — Telephone Encounter (Signed)
Patient called and needs a refill on Oxycodone. CB#[phone]

## 2023-03-20 NOTE — Telephone Encounter (Signed)
Pt informed

## 2023-03-22 ENCOUNTER — Other Ambulatory Visit: Payer: Self-pay | Admitting: *Deleted

## 2023-03-22 DIAGNOSIS — N186 End stage renal disease: Secondary | ICD-10-CM

## 2023-03-23 ENCOUNTER — Other Ambulatory Visit (HOSPITAL_COMMUNITY): Payer: Medicaid Other

## 2023-03-23 ENCOUNTER — Other Ambulatory Visit (HOSPITAL_COMMUNITY): Payer: Self-pay | Admitting: Internal Medicine

## 2023-03-23 ENCOUNTER — Encounter (HOSPITAL_COMMUNITY): Payer: Medicaid Other

## 2023-03-23 ENCOUNTER — Other Ambulatory Visit: Payer: Self-pay | Admitting: Radiology

## 2023-03-23 DIAGNOSIS — E1129 Type 2 diabetes mellitus with other diabetic kidney complication: Secondary | ICD-10-CM | POA: Diagnosis not present

## 2023-03-23 DIAGNOSIS — N186 End stage renal disease: Secondary | ICD-10-CM | POA: Diagnosis not present

## 2023-03-23 DIAGNOSIS — Z992 Dependence on renal dialysis: Secondary | ICD-10-CM | POA: Diagnosis not present

## 2023-03-24 ENCOUNTER — Ambulatory Visit (HOSPITAL_COMMUNITY)
Admission: RE | Admit: 2023-03-24 | Discharge: 2023-03-24 | Disposition: A | Discharge status: Home / Self Care | Payer: Medicaid Other | Source: Ambulatory Visit | Attending: Internal Medicine | Admitting: Internal Medicine

## 2023-03-24 DIAGNOSIS — T8249XA Other complication of vascular dialysis catheter, initial encounter: Secondary | ICD-10-CM | POA: Diagnosis present

## 2023-03-24 DIAGNOSIS — Z992 Dependence on renal dialysis: Secondary | ICD-10-CM | POA: Insufficient documentation

## 2023-03-24 DIAGNOSIS — N186 End stage renal disease: Secondary | ICD-10-CM

## 2023-03-24 DIAGNOSIS — Y841 Kidney dialysis as the cause of abnormal reaction of the patient, or of later complication, without mention of misadventure at the time of the procedure: Secondary | ICD-10-CM | POA: Insufficient documentation

## 2023-03-24 HISTORY — PX: IR FLUORO GUIDE CV LINE LEFT: IMG2282

## 2023-03-24 MED ORDER — HEPARIN SODIUM (PORCINE) 1000 UNIT/ML IJ SOLN
10000.0000 [IU] | Freq: Once | INTRAMUSCULAR | Status: AC
  Administered 2023-03-24: 3800 [IU]

## 2023-03-24 MED ORDER — SODIUM CHLORIDE 0.9% FLUSH
10.0000 mL | Freq: Two times a day (BID) | INTRAVENOUS | Status: DC
Start: 2023-03-24 — End: 2023-03-25

## 2023-03-24 MED ORDER — LIDOCAINE HCL 1 % IJ SOLN
INTRAMUSCULAR | Status: AC
  Filled 2023-03-24: qty 20

## 2023-03-24 MED ORDER — CEFAZOLIN SODIUM-DEXTROSE 2-4 GM/100ML-% IV SOLN
INTRAVENOUS | Status: AC
  Filled 2023-03-24: qty 100

## 2023-03-24 MED ORDER — LIDOCAINE HCL 1 % IJ SOLN
20.0000 mL | Freq: Once | INTRAMUSCULAR | Status: AC
  Administered 2023-03-24: 5 mL via INTRADERMAL

## 2023-03-24 MED ORDER — CEFAZOLIN SODIUM-DEXTROSE 1-4 GM/50ML-% IV SOLN
1.0000 g | INTRAVENOUS | Status: AC
  Administered 2023-03-24: 1 g via INTRAVENOUS
  Filled 2023-03-24: qty 50

## 2023-03-24 MED ORDER — CEFAZOLIN SODIUM-DEXTROSE 2-4 GM/100ML-% IV SOLN
2.0000 g | Freq: Once | INTRAVENOUS | Status: AC
  Administered 2023-03-24: 2 g via INTRAVENOUS

## 2023-03-24 MED ORDER — CEFAZOLIN (ANCEF) 1 G IV SOLR: 2.0000 g | INTRAVENOUS | Status: DC

## 2023-03-24 MED ORDER — HEPARIN SODIUM (PORCINE) 1000 UNIT/ML IJ SOLN
INTRAMUSCULAR | Status: AC
  Filled 2023-03-24: qty 10

## 2023-03-24 NOTE — Telephone Encounter (Signed)
Closing encounter

## 2023-03-24 NOTE — Procedures (Signed)
Interventional Radiology Procedure Note  Procedure: Exchange of a right IJ approach tunneled HD catheter.  New 23cm.   Tip is positioned at the superior cavoatrial junction and catheter is ready for immediate use.   Excellent aspiration on completion.   Complications: None  Recommendations:  - Ok to use - Do not submerge - Routine line care   Signed,  Yvone Neu. Loreta Ave, DO

## 2023-03-28 ENCOUNTER — Ambulatory Visit (HOSPITAL_COMMUNITY)
Admission: RE | Admit: 2023-03-28 | Discharge: 2023-03-28 | Disposition: A | Discharge status: Home / Self Care | Payer: Medicaid Other | Source: Ambulatory Visit | Attending: Surgery | Admitting: Surgery

## 2023-03-28 ENCOUNTER — Ambulatory Visit (INDEPENDENT_AMBULATORY_CARE_PROVIDER_SITE_OTHER)
Admission: RE | Admit: 2023-03-28 | Discharge: 2023-03-28 | Disposition: A | Discharge status: Home / Self Care | Payer: Medicaid Other | Source: Ambulatory Visit | Attending: Vascular Surgery | Admitting: Vascular Surgery

## 2023-03-28 DIAGNOSIS — Z992 Dependence on renal dialysis: Secondary | ICD-10-CM

## 2023-03-28 DIAGNOSIS — N186 End stage renal disease: Secondary | ICD-10-CM | POA: Insufficient documentation

## 2023-03-29 ENCOUNTER — Ambulatory Visit: Payer: Medicaid Other | Admitting: Family

## 2023-03-29 ENCOUNTER — Ambulatory Visit: Payer: Medicaid Other | Admitting: Vascular Surgery

## 2023-03-30 ENCOUNTER — Other Ambulatory Visit: Payer: Self-pay | Admitting: Student

## 2023-03-30 DIAGNOSIS — Z794 Long term (current) use of insulin: Secondary | ICD-10-CM

## 2023-04-05 ENCOUNTER — Ambulatory Visit (INDEPENDENT_AMBULATORY_CARE_PROVIDER_SITE_OTHER): Payer: Medicaid Other | Admitting: Family

## 2023-04-05 ENCOUNTER — Encounter: Payer: Self-pay | Admitting: Vascular Surgery

## 2023-04-05 ENCOUNTER — Encounter: Payer: Self-pay | Admitting: Family

## 2023-04-05 ENCOUNTER — Ambulatory Visit (INDEPENDENT_AMBULATORY_CARE_PROVIDER_SITE_OTHER): Payer: Medicaid Other | Admitting: Vascular Surgery

## 2023-04-05 VITALS — BP 137/83 | HR 110 | Temp 98.0°F | Resp 20 | Ht 74.0 in | Wt 347.7 lb

## 2023-04-05 DIAGNOSIS — Z89432 Acquired absence of left foot: Secondary | ICD-10-CM

## 2023-04-05 DIAGNOSIS — N186 End stage renal disease: Secondary | ICD-10-CM | POA: Diagnosis not present

## 2023-04-05 DIAGNOSIS — T8781 Dehiscence of amputation stump: Secondary | ICD-10-CM

## 2023-04-05 NOTE — H&P (View-Only) (Signed)
 Patient ID: Paul [name], male   DOB: 04/18/1991, 32 y.o.   MRN: [id number]  Reason for Consult: Follow-up   Referred by Darnell Level, MD  Subjective:     HPI:  Paul [name] is a 32 y.o. male history of incision and multiple previous upper extremity surgeries most recently brachial artery thrombectomy after embolectomy during procedure to open a clotted graft.  He states he does have some numbness of the left hand with fully motor intact.  Patient is right-hand dominant but does have previous surgeries in the right as well.  He is here today to discuss further permanent access as he is currently dialyzing Tuesdays, Thursdays and Saturdays at tunneled dialysis catheter.  He does not have any complaints related to today's visit.  Past Medical History:  Diagnosis Date   Acute hypoxemic respiratory failure (HCC) 09/11/2016   Anemia    Chronic kidney disease    ARF on CRF Dialysis T/TH/Sa   Diabetes mellitus    Type II   End stage renal disease on dialysis (HCC)    [place]    GERD (gastroesophageal reflux disease)    diet controlled   HLD (hyperlipidemia)    Hypertension    Hypothyroidism    Morbid obesity (HCC)    Sacral wound    resolved   Thyroid disease    Family History  Problem Relation Age of Onset   Heart disease Mother    Hypertension Mother    Past Surgical History:  Procedure Laterality Date   AMPUTATION Left 05/08/2021   Procedure: AMPUTATION GREAT TOE;  Surgeon: Nadara Mustard, MD;  Location: [facility] OR;  Service: Orthopedics;  Laterality: Left;   AMPUTATION Left 10/01/2022   Procedure: LEFT FOOT AMPUTATION RAY 1st;  Surgeon: Nadara Mustard, MD;  Location: [facility] OR;  Service: Orthopedics;  Laterality: Left;   AV FISTULA PLACEMENT Left 09/26/2016   Procedure: LEFT UPPER ARM ARTERIOVENOUS (AV) FISTULA CREATION;  Surgeon: Sherren Kerns, MD;  Location: [facility] OR;  Service: Vascular;  Laterality: Left;   AV FISTULA PLACEMENT Left 12/02/2016   Procedure: INSERTION  OF ARTERIOVENOUS GORE-TEX GRAFT LEFT UPPER  ARM USING A 4-7MM BY 45CM GRAFT ;  Surgeon: Larina Earthly, MD;  Location: [facility] OR;  Service: Vascular;  Laterality: Left;   AV FISTULA PLACEMENT Right 07/03/2020   Procedure: INSERTION OF ARTERIOVENOUS (AV) GORE-TEX GRAFT RIGHT UPPER ARM;  Surgeon: Cephus Shelling, MD;  Location: [facility] OR;  Service: Vascular;  Laterality: Right;   AV FISTULA PLACEMENT Left 07/02/2021   Procedure: LEFT UPPER EXTREMITY ARTERIOVENOUS FISTULA CREATION;  Surgeon: Maeola Harman, MD;  Location: [facility] OR;  Service: Vascular;  Laterality: Left;   BASCILIC VEIN TRANSPOSITION Right 05/06/2020   Procedure: FIRST STAGE RIGHT BASCILIC VEIN TRANSPOSITION;  Surgeon: Nada Libman, MD;  Location: [facility] OR;  Service: Vascular;  Laterality: Right;   BASCILIC VEIN TRANSPOSITION Left 08/20/2021   Procedure: LEFT ARM SECOND STAGE WITH ARTERIOVENOUS GRAFT;  Surgeon: Maeola Harman, MD;  Location: [facility] OR;  Service: Vascular;  Laterality: Left;   EXCHANGE OF A DIALYSIS CATHETER Right 10/08/2016   Procedure: EXCHANGE OF A DIALYSIS CATHETER-RIGHT INTERNAL JUGULAR;  Surgeon: Fransisco Hertz, MD;  Location: [facility] OR;  Service: Vascular;  Laterality: Right;   INSERTION OF DIALYSIS CATHETER Right 09/26/2016   Procedure: INSERTION OF DIALYSIS CATHETER - Right Internal Jugular Placement;  Surgeon: Sherren Kerns, MD;  Location: [facility] OR;  Service: Vascular;  Laterality: Right;   INSERTION OF  DIALYSIS CATHETER Left 03/22/2018   Procedure: INSERTION OF DIALYSIS CATHETER;  Surgeon: Cephus Shelling, MD;  Location: [facility] OR;  Service: Vascular;  Laterality: Left;   INTRAOPERATIVE ARTERIOGRAM Left 05/04/2022   Procedure: INTRA OPERATIVE ARTERIOGRAM;  Surgeon: Victorino Sparrow, MD;  Location: [facility] OR;  Service: Vascular;  Laterality: Left;   IR AV DIALY SHUNT INTRO NEEDLE/INTRAC INITIAL W/PTA/STENT/IMG LT Left 02/17/2023   IR AV DIALY SHUNT INTRO NEEDLE/INTRACATH INITIAL W/PTA/IMG LEFT  08/19/2022   IR  AV DIALY SHUNT INTRO NEEDLE/INTRACATH INITIAL W/PTA/IMG LEFT  11/14/2022   IR DIALY SHUNT INTRO NEEDLE/INTRACATH INITIAL W/IMG LEFT Left 04/15/2022   IR FLUORO GUIDE CV LINE LEFT  04/19/2022   IR FLUORO GUIDE CV LINE LEFT  05/30/2022   IR FLUORO GUIDE CV LINE LEFT  03/24/2023   IR FLUORO GUIDE CV LINE RIGHT  03/26/2021   IR FLUORO GUIDE CV LINE RIGHT  05/13/2021   IR FLUORO GUIDE CV LINE RIGHT  05/04/2022   IR FLUORO GUIDE CV LINE RIGHT  02/17/2023   IR REMOVAL TUN CV CATH W/O FL  05/11/2021   IR REMOVAL TUN CV CATH W/O FL  10/06/2021   IR THROMBECTOMY AV FISTULA W/THROMBOLYSIS/PTA INC/SHUNT/IMG LEFT Left 02/02/2022   IR THROMBECTOMY AV FISTULA W/THROMBOLYSIS/PTA INC/SHUNT/IMG LEFT Left 03/22/2022   IR THROMBECTOMY AV FISTULA W/THROMBOLYSIS/PTA INC/SHUNT/IMG LEFT Left 05/04/2022   IR THROMBECTOMY AV FISTULA W/THROMBOLYSIS/PTA INC/SHUNT/IMG LEFT Left 12/09/2022   IR THROMBECTOMY AV FISTULA W/THROMBOLYSIS/PTA/STENT INC/SHUNT/IMG LT Left 04/27/2022   IR US GUIDE VASC ACCESS LEFT  02/02/2022   IR US GUIDE VASC ACCESS LEFT  03/22/2022   IR US GUIDE VASC ACCESS LEFT  04/15/2022   IR US GUIDE VASC ACCESS LEFT  04/19/2022   IR US GUIDE VASC ACCESS LEFT  04/27/2022   IR US GUIDE VASC ACCESS LEFT  12/13/2022   IR US GUIDE VASC ACCESS LEFT  02/21/2023   IR US GUIDE VASC ACCESS RIGHT  03/26/2021   IR US GUIDE VASC ACCESS RIGHT  05/13/2021   IR US GUIDE VASC ACCESS RIGHT  05/04/2022   IR US GUIDE VASC ACCESS RIGHT  02/17/2023   REVISON OF ARTERIOVENOUS FISTULA Left 03/22/2018   Procedure: LEFT UPPER EXTREMITY ARTERIOVENOUS  REVISION WITH GORE-TEX GRAFT.;  Surgeon: Cephus Shelling, MD;  Location: [facility] OR;  Service: Vascular;  Laterality: Left;   TEE WITHOUT CARDIOVERSION N/A 05/12/2021   Procedure: TRANSESOPHAGEAL ECHOCARDIOGRAM (TEE);  Surgeon: Christell Constant, MD;  Location: [facility] ENDOSCOPY;  Service: Cardiovascular;  Laterality: N/A;   THROMBECTOMY Left 08/26/2019   thrombectomy of LUA loop AVG    THROMBECTOMY AND REVISION OF ARTERIOVENTOUS (AV) GORETEX  GRAFT Right 07/27/2020   Procedure: INSERTION OF RIGHT ARM LOOP GRAFT WITH EXCISION OF RIGHT ARM BRACHIAL AXILLARY GRAFT;  Surgeon: Cephus Shelling, MD;  Location: [facility] OR;  Service: Vascular;  Laterality: Right;   THROMBECTOMY BRACHIAL ARTERY Left 05/04/2022   Procedure: LEFT ARM RADIAL BRACHIAL ARTERY EMBOLECTOMY;  Surgeon: Victorino Sparrow, MD;  Location: [name] OR;  Service: Vascular;  Laterality: Left;   UPPER EXTREMITY VENOGRAPHY Bilateral 06/14/2021   Procedure: UPPER EXTREMITY VENOGRAPHY;  Surgeon: Maeola Harman, MD;  Location: [facility] INVASIVE CV LAB;  Service: Cardiovascular;  Laterality: Bilateral;   VISCERAL ANGIOGRAPHY Right 06/17/2020   Procedure: CENTRAL VENO;  Surgeon: Leonie Douglas, MD;  Location: [facility] INVASIVE CV LAB;  Service: Cardiovascular;  Laterality: Right;    Short Social History:  Social History   Tobacco Use   Smoking status: Never  Smokeless tobacco: Never  Substance Use Topics   Alcohol use: No    Allergies  Allergen Reactions   Hydralazine Hcl Other (See Comments)    DRUG-INDUCED LUPUS    Current Outpatient Medications  Medication Sig Dispense Refill   ACCU-CHEK GUIDE test strip USE AS DIRECTED TWICE DAILY 100 each 0   Accu-Chek Softclix Lancets lancets Use as instructed 100 each 12   apixaban (ELIQUIS) 5 MG TABS tablet Take 1 tablet by mouth twice daily 60 tablet 5   ascorbic acid (VITAMIN C) 1000 MG tablet Take 1 tablet (1,000 mg total) by mouth daily. 30 tablet 0   aspirin EC 81 MG tablet Take 1 tablet (81 mg total) by mouth daily. Swallow whole. 90 tablet 0   Blood Glucose Monitoring Suppl (ACCU-CHEK GUIDE ME) w/Device KIT 1 kit by Does not apply route daily. 1 kit 0   calcium carbonate (TUMS - DOSED IN MG ELEMENTAL CALCIUM) 500 MG chewable tablet Chew 1,000 mg by mouth daily as needed for indigestion or heartburn.     cetirizine (ZYRTEC) 10 MG tablet Take 1 tablet (10 mg total) by mouth  daily as needed for allergies. 30 tablet 0   cinacalcet (SENSIPAR) 30 MG tablet Take 30 mg by mouth daily.     ciprofloxacin (CIPRO) 500 MG tablet Take 1 tablet (500 mg total) by mouth 2 (two) times daily. 20 tablet 0   ferric citrate (AURYXIA) 1 GM 210 MG(Fe) tablet Take 2-4 tablets (420-840 mg total) by mouth See admin instructions. Take 4 tablets (840 mg) by mouth with each meal & take 2 tablets (420 mg) by mouth with each snack. 270 tablet 0   gabapentin (NEURONTIN) 100 MG capsule TAKE 1 CAPSULE BY MOUTH THREE TIMES DAILY 90 capsule 0   gabapentin (NEURONTIN) 300 MG capsule Take 1 capsule by mouth at bedtime 30 capsule 0   LANTUS 100 UNIT/ML injection INJECT 5 UNITS SUBCUTANEOUSLY TWICE DAILY 10 mL 0   levothyroxine (SYNTHROID) 150 MCG tablet TAKE 1 TABLET BY MOUTH ONCE DAILY BEFORE BREAKFAST *APPOINTMENT NEEDED FOR FURTHER REFILLS* (Patient taking differently: Take 150 mcg by mouth daily before breakfast.) 15 tablet 0   midodrine (PROAMATINE) 5 MG tablet Take 1 tablet (5 mg total) by mouth Every Tuesday,Thursday,and Saturday with dialysis. 30 tablet 3   multivitamin (RENA-VIT) TABS tablet Take 1 tablet by mouth at bedtime. 30 tablet 0   oxyCODONE (OXY IR/ROXICODONE) 5 MG immediate release tablet Take 1 tablet (5 mg total) by mouth every 12 (twelve) hours as needed for severe pain (pain score 7-10) (pain score 4-6). 14 tablet 0   polyethylene glycol (MIRALAX / GLYCOLAX) 17 g packet Take 17 g by mouth daily as needed for mild constipation. 14 each 0   rosuvastatin (CRESTOR) 20 MG tablet Take 1 tablet by mouth once daily 90 tablet 0   Syringe, Disposable, (B-D SYRINGE SLIP TIP 30CC) 30 ML MISC 1 Syringe by Does not apply route daily. 30 each 11   zinc sulfate 220 (50 Zn) MG capsule Take 1 capsule (220 mg total) by mouth daily. 14 capsule 0   No current facility-administered medications for this visit.    Review of Systems  Constitutional: Positive for unexpected weight change.  HENT: HENT  negative.  Eyes: Eyes negative.  Respiratory: Respiratory negative.  Cardiovascular: Cardiovascular negative.  GI: Gastrointestinal negative.  Musculoskeletal: Musculoskeletal negative.  Skin: Skin negative.  Neurological: Neurological negative. Hematologic: Hematologic/lymphatic negative.  Psychiatric: Psychiatric negative.        Objective:  Objective   Vitals:   04/05/23 0822  BP: 137/83  Pulse: (!) 110  Resp: 20  Temp: 98 F (36.7 C)  SpO2: 98%  Weight: (!) 347 lb 11.2 oz (157.7 kg)  Height: 6\' 2"  (1.88 m)   Body mass index is 44.64 kg/m.  Physical Exam HENT:     Head: Normocephalic.     Nose: Nose normal.  Eyes:     Pupils: Pupils are equal, round, and reactive to light.  Cardiovascular:     Rate and Rhythm: Normal rate.     Pulses:          Radial pulses are 2+ on the right side and 2+ on the left side.  Pulmonary:     Effort: Pulmonary effort is normal.  Abdominal:     General: Abdomen is flat.  Musculoskeletal:        General: Normal range of motion.     Right lower leg: No edema.     Left lower leg: No edema.  Skin:    General: Skin is warm.     Capillary Refill: Capillary refill takes less than 2 seconds.  Neurological:     General: No focal deficit present.     Mental Status: He is alert.  Psychiatric:        Mood and Affect: Mood normal.        Thought Content: Thought content normal.     Data: Right Pre-Dialysis Findings:  +-----------------------+----------+--------------------+---------+--------  +  Location              PSV (cm/s)Intralum. Diam. (cm)Waveform  Comments  +-----------------------+----------+--------------------+---------+--------  +  Brachial Antecub. [id number]        0.37                triphasic           +-----------------------+----------+--------------------+---------+--------  +  Radial Art at Wrist    65        0.23                triphasic            +-----------------------+----------+--------------------+---------+--------  +  Ulnar Art at Wrist     45        0.22                triphasic           +-----------------------+----------+--------------------+---------+--------  +    Left Pre-Dialysis Findings:  +-----------------------+----------+--------------------+---------+--------  +  Location              PSV (cm/s)Intralum. Diam. (cm)Waveform  Comments  +-----------------------+----------+--------------------+---------+--------  +  Brachial Antecub. [id number]        0.40                triphasic           +-----------------------+----------+--------------------+---------+--------  +  Radial Art at Wrist    36        0.16                triphasic           +-----------------------+----------+--------------------+---------+--------  +  Ulnar Art at Wrist     45        0.20                triphasic           +-----------------------+----------+--------------------+---------+--------  +  Summary:    Right: No obstruction visualized in the right upper extremity.  Left: No  obstruction visualized in the left upper extremity.     Right Cephalic   Diameter (cm)Depth (cm)Findings   +-----------------+-------------+----------+---------+  Shoulder            0.40                          +-----------------+-------------+----------+---------+  Prox upper arm       0.43                          +-----------------+-------------+----------+---------+  Mid upper arm        0.46               branching  +-----------------+-------------+----------+---------+  Dist upper arm       0.37                          +-----------------+-------------+----------+---------+  Antecubital fossa    0.39               branching  +-----------------+-------------+----------+---------+  Prox forearm         0.32               branching   +-----------------+-------------+----------+---------+  Mid forearm          0.37                          +-----------------+-------------+----------+---------+  Dist forearm         0.32                          +-----------------+-------------+----------+---------+  Wrist               0.38                          +-----------------+-------------+----------+---------+   +-----------------+-------------+----------+--------------+  Right Basilic    Diameter (cm)Depth (cm)   Findings     +-----------------+-------------+----------+--------------+  Shoulder                               not visualized  +-----------------+-------------+----------+--------------+  Prox upper arm                          not visualized  +-----------------+-------------+----------+--------------+  Mid upper arm                           not visualized  +-----------------+-------------+----------+--------------+  Dist upper arm                          not visualized  +-----------------+-------------+----------+--------------+  Antecubital fossa                       not visualized  +-----------------+-------------+----------+--------------+  Prox forearm                            not visualized  +-----------------+-------------+----------+--------------+  Mid forearm                             not visualized  +-----------------+-------------+----------+--------------+  Distal forearm  not visualized  +-----------------+-------------+----------+--------------+  Wrist                                  not visualized  +-----------------+-------------+----------+--------------+   +-----------------+-------------+----------+--------------+  Left Cephalic    Diameter (cm)Depth (cm)   Findings     +-----------------+-------------+----------+--------------+  Shoulder            0.35                                +-----------------+-------------+----------+--------------+  Prox upper arm       0.46                               +-----------------+-------------+----------+--------------+  Mid upper arm        0.50                               +-----------------+-------------+----------+--------------+  Dist upper arm       0.41                 branching     +-----------------+-------------+----------+--------------+  Antecubital fossa    0.21                               +-----------------+-------------+----------+--------------+  Prox forearm                            not visualized  +-----------------+-------------+----------+--------------+  Mid forearm                             not visualized  +-----------------+-------------+----------+--------------+  Dist forearm                            not visualized  +-----------------+-------------+----------+--------------+  Wrist                                  not visualized  +-----------------+-------------+----------+--------------+   +-----------------+-------------+----------+--------------+  Left Basilic     Diameter (cm)Depth (cm)   Findings     +-----------------+-------------+----------+--------------+  Shoulder                               not visualized  +-----------------+-------------+----------+--------------+  Prox upper arm                          not visualized  +-----------------+-------------+----------+--------------+  Mid upper arm                           not visualized  +-----------------+-------------+----------+--------------+  Dist upper arm                          not visualized  +-----------------+-------------+----------+--------------+  Antecubital fossa                       not visualized  +-----------------+-------------+----------+--------------+  Prox forearm  not visualized   +-----------------+-------------+----------+--------------+  Mid forearm                             not visualized  +-----------------+-------------+----------+--------------+  Distal forearm                          not visualized  +-----------------+-------------+----------+--------------+  Wrist                                  not visualized  +-----------------+-------------+----------+--------------+        Assessment/Plan:     32 year old male with end-stage renal disease as noted above multiple previous bilateral upper extremity procedures.  Possibly has a right arm cephalic vein fistula on today's ultrasound and also by previous venography but given his extensive history discussed proceeding with at least right upper extremity venography on a nondialysis day in the near future to plan for future fistula versus grafting.  He can continue Eliquis for this procedure given that is performed through IV and we will hold prior to any formal surgical intervention.     Maeola Harman MD [facility]

## 2023-04-05 NOTE — Progress Notes (Signed)
Patient ID: Paul [name], male   DOB: 04/18/1991, 32 y.o.   MRN: [id number]  Reason for Consult: Follow-up   Referred by Darnell Level, MD  Subjective:     HPI:  Paul [name] is a 32 y.o. male history of incision and multiple previous upper extremity surgeries most recently brachial artery thrombectomy after embolectomy during procedure to open a clotted graft.  He states he does have some numbness of the left hand with fully motor intact.  Patient is right-hand dominant but does have previous surgeries in the right as well.  He is here today to discuss further permanent access as he is currently dialyzing Tuesdays, Thursdays and Saturdays at tunneled dialysis catheter.  He does not have any complaints related to today's visit.  Past Medical History:  Diagnosis Date   Acute hypoxemic respiratory failure (HCC) 09/11/2016   Anemia    Chronic kidney disease    ARF on CRF Dialysis T/TH/Sa   Diabetes mellitus    Type II   End stage renal disease on dialysis (HCC)    [place]    GERD (gastroesophageal reflux disease)    diet controlled   HLD (hyperlipidemia)    Hypertension    Hypothyroidism    Morbid obesity (HCC)    Sacral wound    resolved   Thyroid disease    Family History  Problem Relation Age of Onset   Heart disease Mother    Hypertension Mother    Past Surgical History:  Procedure Laterality Date   AMPUTATION Left 05/08/2021   Procedure: AMPUTATION GREAT TOE;  Surgeon: Nadara Mustard, MD;  Location: [facility] OR;  Service: Orthopedics;  Laterality: Left;   AMPUTATION Left 10/01/2022   Procedure: LEFT FOOT AMPUTATION RAY 1st;  Surgeon: Nadara Mustard, MD;  Location: [facility] OR;  Service: Orthopedics;  Laterality: Left;   AV FISTULA PLACEMENT Left 09/26/2016   Procedure: LEFT UPPER ARM ARTERIOVENOUS (AV) FISTULA CREATION;  Surgeon: Sherren Kerns, MD;  Location: [facility] OR;  Service: Vascular;  Laterality: Left;   AV FISTULA PLACEMENT Left 12/02/2016   Procedure: INSERTION  OF ARTERIOVENOUS GORE-TEX GRAFT LEFT UPPER  ARM USING A 4-7MM BY 45CM GRAFT ;  Surgeon: Larina Earthly, MD;  Location: [facility] OR;  Service: Vascular;  Laterality: Left;   AV FISTULA PLACEMENT Right 07/03/2020   Procedure: INSERTION OF ARTERIOVENOUS (AV) GORE-TEX GRAFT RIGHT UPPER ARM;  Surgeon: Cephus Shelling, MD;  Location: [facility] OR;  Service: Vascular;  Laterality: Right;   AV FISTULA PLACEMENT Left 07/02/2021   Procedure: LEFT UPPER EXTREMITY ARTERIOVENOUS FISTULA CREATION;  Surgeon: Maeola Harman, MD;  Location: [facility] OR;  Service: Vascular;  Laterality: Left;   BASCILIC VEIN TRANSPOSITION Right 05/06/2020   Procedure: FIRST STAGE RIGHT BASCILIC VEIN TRANSPOSITION;  Surgeon: Nada Libman, MD;  Location: [facility] OR;  Service: Vascular;  Laterality: Right;   BASCILIC VEIN TRANSPOSITION Left 08/20/2021   Procedure: LEFT ARM SECOND STAGE WITH ARTERIOVENOUS GRAFT;  Surgeon: Maeola Harman, MD;  Location: [facility] OR;  Service: Vascular;  Laterality: Left;   EXCHANGE OF A DIALYSIS CATHETER Right 10/08/2016   Procedure: EXCHANGE OF A DIALYSIS CATHETER-RIGHT INTERNAL JUGULAR;  Surgeon: Fransisco Hertz, MD;  Location: [facility] OR;  Service: Vascular;  Laterality: Right;   INSERTION OF DIALYSIS CATHETER Right 09/26/2016   Procedure: INSERTION OF DIALYSIS CATHETER - Right Internal Jugular Placement;  Surgeon: Sherren Kerns, MD;  Location: [facility] OR;  Service: Vascular;  Laterality: Right;   INSERTION OF  DIALYSIS CATHETER Left 03/22/2018   Procedure: INSERTION OF DIALYSIS CATHETER;  Surgeon: Cephus Shelling, MD;  Location: [facility] OR;  Service: Vascular;  Laterality: Left;   INTRAOPERATIVE ARTERIOGRAM Left 05/04/2022   Procedure: INTRA OPERATIVE ARTERIOGRAM;  Surgeon: Victorino Sparrow, MD;  Location: [facility] OR;  Service: Vascular;  Laterality: Left;   IR AV DIALY SHUNT INTRO NEEDLE/INTRAC INITIAL W/PTA/STENT/IMG LT Left 02/17/2023   IR AV DIALY SHUNT INTRO NEEDLE/INTRACATH INITIAL W/PTA/IMG LEFT  08/19/2022   IR  AV DIALY SHUNT INTRO NEEDLE/INTRACATH INITIAL W/PTA/IMG LEFT  11/14/2022   IR DIALY SHUNT INTRO NEEDLE/INTRACATH INITIAL W/IMG LEFT Left 04/15/2022   IR FLUORO GUIDE CV LINE LEFT  04/19/2022   IR FLUORO GUIDE CV LINE LEFT  05/30/2022   IR FLUORO GUIDE CV LINE LEFT  03/24/2023   IR FLUORO GUIDE CV LINE RIGHT  03/26/2021   IR FLUORO GUIDE CV LINE RIGHT  05/13/2021   IR FLUORO GUIDE CV LINE RIGHT  05/04/2022   IR FLUORO GUIDE CV LINE RIGHT  02/17/2023   IR REMOVAL TUN CV CATH W/O FL  05/11/2021   IR REMOVAL TUN CV CATH W/O FL  10/06/2021   IR THROMBECTOMY AV FISTULA W/THROMBOLYSIS/PTA INC/SHUNT/IMG LEFT Left 02/02/2022   IR THROMBECTOMY AV FISTULA W/THROMBOLYSIS/PTA INC/SHUNT/IMG LEFT Left 03/22/2022   IR THROMBECTOMY AV FISTULA W/THROMBOLYSIS/PTA INC/SHUNT/IMG LEFT Left 05/04/2022   IR THROMBECTOMY AV FISTULA W/THROMBOLYSIS/PTA INC/SHUNT/IMG LEFT Left 12/09/2022   IR THROMBECTOMY AV FISTULA W/THROMBOLYSIS/PTA/STENT INC/SHUNT/IMG LT Left 04/27/2022   IR US GUIDE VASC ACCESS LEFT  02/02/2022   IR US GUIDE VASC ACCESS LEFT  03/22/2022   IR US GUIDE VASC ACCESS LEFT  04/15/2022   IR US GUIDE VASC ACCESS LEFT  04/19/2022   IR US GUIDE VASC ACCESS LEFT  04/27/2022   IR US GUIDE VASC ACCESS LEFT  12/13/2022   IR US GUIDE VASC ACCESS LEFT  02/21/2023   IR US GUIDE VASC ACCESS RIGHT  03/26/2021   IR US GUIDE VASC ACCESS RIGHT  05/13/2021   IR US GUIDE VASC ACCESS RIGHT  05/04/2022   IR US GUIDE VASC ACCESS RIGHT  02/17/2023   REVISON OF ARTERIOVENOUS FISTULA Left 03/22/2018   Procedure: LEFT UPPER EXTREMITY ARTERIOVENOUS  REVISION WITH GORE-TEX GRAFT.;  Surgeon: Cephus Shelling, MD;  Location: [facility] OR;  Service: Vascular;  Laterality: Left;   TEE WITHOUT CARDIOVERSION N/A 05/12/2021   Procedure: TRANSESOPHAGEAL ECHOCARDIOGRAM (TEE);  Surgeon: Christell Constant, MD;  Location: [facility] ENDOSCOPY;  Service: Cardiovascular;  Laterality: N/A;   THROMBECTOMY Left 08/26/2019   thrombectomy of LUA loop AVG    THROMBECTOMY AND REVISION OF ARTERIOVENTOUS (AV) GORETEX  GRAFT Right 07/27/2020   Procedure: INSERTION OF RIGHT ARM LOOP GRAFT WITH EXCISION OF RIGHT ARM BRACHIAL AXILLARY GRAFT;  Surgeon: Cephus Shelling, MD;  Location: [facility] OR;  Service: Vascular;  Laterality: Right;   THROMBECTOMY BRACHIAL ARTERY Left 05/04/2022   Procedure: LEFT ARM RADIAL BRACHIAL ARTERY EMBOLECTOMY;  Surgeon: Victorino Sparrow, MD;  Location: [name] OR;  Service: Vascular;  Laterality: Left;   UPPER EXTREMITY VENOGRAPHY Bilateral 06/14/2021   Procedure: UPPER EXTREMITY VENOGRAPHY;  Surgeon: Maeola Harman, MD;  Location: [facility] INVASIVE CV LAB;  Service: Cardiovascular;  Laterality: Bilateral;   VISCERAL ANGIOGRAPHY Right 06/17/2020   Procedure: CENTRAL VENO;  Surgeon: Leonie Douglas, MD;  Location: [facility] INVASIVE CV LAB;  Service: Cardiovascular;  Laterality: Right;    Short Social History:  Social History   Tobacco Use   Smoking status: Never  Smokeless tobacco: Never  Substance Use Topics   Alcohol use: No    Allergies  Allergen Reactions   Hydralazine Hcl Other (See Comments)    DRUG-INDUCED LUPUS    Current Outpatient Medications  Medication Sig Dispense Refill   ACCU-CHEK GUIDE test strip USE AS DIRECTED TWICE DAILY 100 each 0   Accu-Chek Softclix Lancets lancets Use as instructed 100 each 12   apixaban (ELIQUIS) 5 MG TABS tablet Take 1 tablet by mouth twice daily 60 tablet 5   ascorbic acid (VITAMIN C) 1000 MG tablet Take 1 tablet (1,000 mg total) by mouth daily. 30 tablet 0   aspirin EC 81 MG tablet Take 1 tablet (81 mg total) by mouth daily. Swallow whole. 90 tablet 0   Blood Glucose Monitoring Suppl (ACCU-CHEK GUIDE ME) w/Device KIT 1 kit by Does not apply route daily. 1 kit 0   calcium carbonate (TUMS - DOSED IN MG ELEMENTAL CALCIUM) 500 MG chewable tablet Chew 1,000 mg by mouth daily as needed for indigestion or heartburn.     cetirizine (ZYRTEC) 10 MG tablet Take 1 tablet (10 mg total) by mouth  daily as needed for allergies. 30 tablet 0   cinacalcet (SENSIPAR) 30 MG tablet Take 30 mg by mouth daily.     ciprofloxacin (CIPRO) 500 MG tablet Take 1 tablet (500 mg total) by mouth 2 (two) times daily. 20 tablet 0   ferric citrate (AURYXIA) 1 GM 210 MG(Fe) tablet Take 2-4 tablets (420-840 mg total) by mouth See admin instructions. Take 4 tablets (840 mg) by mouth with each meal & take 2 tablets (420 mg) by mouth with each snack. 270 tablet 0   gabapentin (NEURONTIN) 100 MG capsule TAKE 1 CAPSULE BY MOUTH THREE TIMES DAILY 90 capsule 0   gabapentin (NEURONTIN) 300 MG capsule Take 1 capsule by mouth at bedtime 30 capsule 0   LANTUS 100 UNIT/ML injection INJECT 5 UNITS SUBCUTANEOUSLY TWICE DAILY 10 mL 0   levothyroxine (SYNTHROID) 150 MCG tablet TAKE 1 TABLET BY MOUTH ONCE DAILY BEFORE BREAKFAST *APPOINTMENT NEEDED FOR FURTHER REFILLS* (Patient taking differently: Take 150 mcg by mouth daily before breakfast.) 15 tablet 0   midodrine (PROAMATINE) 5 MG tablet Take 1 tablet (5 mg total) by mouth Every Tuesday,Thursday,and Saturday with dialysis. 30 tablet 3   multivitamin (RENA-VIT) TABS tablet Take 1 tablet by mouth at bedtime. 30 tablet 0   oxyCODONE (OXY IR/ROXICODONE) 5 MG immediate release tablet Take 1 tablet (5 mg total) by mouth every 12 (twelve) hours as needed for severe pain (pain score 7-10) (pain score 4-6). 14 tablet 0   polyethylene glycol (MIRALAX / GLYCOLAX) 17 g packet Take 17 g by mouth daily as needed for mild constipation. 14 each 0   rosuvastatin (CRESTOR) 20 MG tablet Take 1 tablet by mouth once daily 90 tablet 0   Syringe, Disposable, (B-D SYRINGE SLIP TIP 30CC) 30 ML MISC 1 Syringe by Does not apply route daily. 30 each 11   zinc sulfate 220 (50 Zn) MG capsule Take 1 capsule (220 mg total) by mouth daily. 14 capsule 0   No current facility-administered medications for this visit.    Review of Systems  Constitutional: Positive for unexpected weight change.  HENT: HENT  negative.  Eyes: Eyes negative.  Respiratory: Respiratory negative.  Cardiovascular: Cardiovascular negative.  GI: Gastrointestinal negative.  Musculoskeletal: Musculoskeletal negative.  Skin: Skin negative.  Neurological: Neurological negative. Hematologic: Hematologic/lymphatic negative.  Psychiatric: Psychiatric negative.        Objective:  Objective   Vitals:   04/05/23 0822  BP: 137/83  Pulse: (!) 110  Resp: 20  Temp: 98 F (36.7 C)  SpO2: 98%  Weight: (!) 347 lb 11.2 oz (157.7 kg)  Height: 6\' 2"  (1.88 m)   Body mass index is 44.64 kg/m.  Physical Exam HENT:     Head: Normocephalic.     Nose: Nose normal.  Eyes:     Pupils: Pupils are equal, round, and reactive to light.  Cardiovascular:     Rate and Rhythm: Normal rate.     Pulses:          Radial pulses are 2+ on the right side and 2+ on the left side.  Pulmonary:     Effort: Pulmonary effort is normal.  Abdominal:     General: Abdomen is flat.  Musculoskeletal:        General: Normal range of motion.     Right lower leg: No edema.     Left lower leg: No edema.  Skin:    General: Skin is warm.     Capillary Refill: Capillary refill takes less than 2 seconds.  Neurological:     General: No focal deficit present.     Mental Status: He is alert.  Psychiatric:        Mood and Affect: Mood normal.        Thought Content: Thought content normal.     Data: Right Pre-Dialysis Findings:  +-----------------------+----------+--------------------+---------+--------  +  Location              PSV (cm/s)Intralum. Diam. (cm)Waveform  Comments  +-----------------------+----------+--------------------+---------+--------  +  Brachial Antecub. [id number]        0.37                triphasic           +-----------------------+----------+--------------------+---------+--------  +  Radial Art at Wrist    65        0.23                triphasic            +-----------------------+----------+--------------------+---------+--------  +  Ulnar Art at Wrist     45        0.22                triphasic           +-----------------------+----------+--------------------+---------+--------  +    Left Pre-Dialysis Findings:  +-----------------------+----------+--------------------+---------+--------  +  Location              PSV (cm/s)Intralum. Diam. (cm)Waveform  Comments  +-----------------------+----------+--------------------+---------+--------  +  Brachial Antecub. [id number]        0.40                triphasic           +-----------------------+----------+--------------------+---------+--------  +  Radial Art at Wrist    36        0.16                triphasic           +-----------------------+----------+--------------------+---------+--------  +  Ulnar Art at Wrist     45        0.20                triphasic           +-----------------------+----------+--------------------+---------+--------  +  Summary:    Right: No obstruction visualized in the right upper extremity.  Left: No  obstruction visualized in the left upper extremity.     Right Cephalic   Diameter (cm)Depth (cm)Findings   +-----------------+-------------+----------+---------+  Shoulder            0.40                          +-----------------+-------------+----------+---------+  Prox upper arm       0.43                          +-----------------+-------------+----------+---------+  Mid upper arm        0.46               branching  +-----------------+-------------+----------+---------+  Dist upper arm       0.37                          +-----------------+-------------+----------+---------+  Antecubital fossa    0.39               branching  +-----------------+-------------+----------+---------+  Prox forearm         0.32               branching   +-----------------+-------------+----------+---------+  Mid forearm          0.37                          +-----------------+-------------+----------+---------+  Dist forearm         0.32                          +-----------------+-------------+----------+---------+  Wrist               0.38                          +-----------------+-------------+----------+---------+   +-----------------+-------------+----------+--------------+  Right Basilic    Diameter (cm)Depth (cm)   Findings     +-----------------+-------------+----------+--------------+  Shoulder                               not visualized  +-----------------+-------------+----------+--------------+  Prox upper arm                          not visualized  +-----------------+-------------+----------+--------------+  Mid upper arm                           not visualized  +-----------------+-------------+----------+--------------+  Dist upper arm                          not visualized  +-----------------+-------------+----------+--------------+  Antecubital fossa                       not visualized  +-----------------+-------------+----------+--------------+  Prox forearm                            not visualized  +-----------------+-------------+----------+--------------+  Mid forearm                             not visualized  +-----------------+-------------+----------+--------------+  Distal forearm  not visualized  +-----------------+-------------+----------+--------------+  Wrist                                  not visualized  +-----------------+-------------+----------+--------------+   +-----------------+-------------+----------+--------------+  Left Cephalic    Diameter (cm)Depth (cm)   Findings     +-----------------+-------------+----------+--------------+  Shoulder            0.35                                +-----------------+-------------+----------+--------------+  Prox upper arm       0.46                               +-----------------+-------------+----------+--------------+  Mid upper arm        0.50                               +-----------------+-------------+----------+--------------+  Dist upper arm       0.41                 branching     +-----------------+-------------+----------+--------------+  Antecubital fossa    0.21                               +-----------------+-------------+----------+--------------+  Prox forearm                            not visualized  +-----------------+-------------+----------+--------------+  Mid forearm                             not visualized  +-----------------+-------------+----------+--------------+  Dist forearm                            not visualized  +-----------------+-------------+----------+--------------+  Wrist                                  not visualized  +-----------------+-------------+----------+--------------+   +-----------------+-------------+----------+--------------+  Left Basilic     Diameter (cm)Depth (cm)   Findings     +-----------------+-------------+----------+--------------+  Shoulder                               not visualized  +-----------------+-------------+----------+--------------+  Prox upper arm                          not visualized  +-----------------+-------------+----------+--------------+  Mid upper arm                           not visualized  +-----------------+-------------+----------+--------------+  Dist upper arm                          not visualized  +-----------------+-------------+----------+--------------+  Antecubital fossa                       not visualized  +-----------------+-------------+----------+--------------+  Prox forearm  not visualized   +-----------------+-------------+----------+--------------+  Mid forearm                             not visualized  +-----------------+-------------+----------+--------------+  Distal forearm                          not visualized  +-----------------+-------------+----------+--------------+  Wrist                                  not visualized  +-----------------+-------------+----------+--------------+        Assessment/Plan:     32 year old male with end-stage renal disease as noted above multiple previous bilateral upper extremity procedures.  Possibly has a right arm cephalic vein fistula on today's ultrasound and also by previous venography but given his extensive history discussed proceeding with at least right upper extremity venography on a nondialysis day in the near future to plan for future fistula versus grafting.  He can continue Eliquis for this procedure given that is performed through IV and we will hold prior to any formal surgical intervention.     Maeola Harman MD [facility]

## 2023-04-05 NOTE — Progress Notes (Signed)
Post-Op Visit Note   Patient: Paul [name]           Date of Birth: Dec 13, 1990           MRN: [id number] Visit Date: 04/05/2023 PCP: Darral Dash, DO  Chief Complaint:  Chief Complaint  Patient presents with   Left Foot - Wound Check    HPI:  HPI The patient is a 32 year old gentleman seen status post left first ray amputation this has been slow to heal.  He has buildup of hyper trophic callus today. Ortho Exam On examination of the left foot the first ray amputation continues to gradually improve.  This was debrided with a 10 blade knife of hyperkeratotic tissue back to viable tissue there are 2 remaining open areas these are 1 cm x 5 mm and distally 5 mm in diameter with 5 mm of depth there is no necrotic tissue do not probe to bone no sign of infection  Visit Diagnoses:  1. 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  May begin dry dressing changes.  Will follow-up in 3 weeks.  Follow-Up Instructions: Return in about 3 weeks (around 04/26/2023).   Imaging: No results found.  Orders:  No orders of the defined types were placed in this encounter.  No orders of the defined types were placed in this encounter.    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   Thrombus 05/04/2022   Ischemia of left upper extremity 05/04/2022   Complication of vascular access for dialysis 04/19/2022   ESRD (end stage renal disease) (HCC) 04/19/2022   Failure of hemodialysis access (HCC) 04/18/2022   Left hand pain 09/22/2021   Diabetic foot infection (HCC)    Wound infection 05/07/2021   MSSA bacteremia 05/07/2021   Osteomyelitis (HCC) 05/07/2021   Abscess 05/07/2021   Sepsis due to cellulitis (HCC) 05/06/2021   Poor compliance with medication 03/17/2021   Fluid overload,  unspecified 07/09/2020   Complication of vascular dialysis catheter 03/23/2020   Other disorders of phosphorus metabolism 01/28/2020   Hypertension 01/08/2020   Hypocalcemia 10/29/2019   ESRD on dialysis (HCC) 11/16/2017   Pruritus, unspecified 04/04/2017   Unspecified protein-calorie malnutrition (HCC) 11/11/2016   Headache, unspecified 10/29/2016   Encounter for immunization 10/19/2016   Iron deficiency anemia, unspecified 10/10/2016   Anemia in chronic kidney disease 09/30/2016   Coagulation defect, unspecified (HCC) 09/30/2016   Pneumonia due to Pseudomonas (HCC) 09/30/2016   Secondary hyperparathyroidism of renal origin (HCC) 09/30/2016   Hypothyroidism 10/29/2009   Type 2 diabetes mellitus (HCC) 04/14/2009   Hyperlipidemia 04/14/2009   OBESITY, MORBID 04/14/2009   Essential hypertension, benign 04/14/2009   Past Medical History:  Diagnosis Date   Acute hypoxemic respiratory failure (HCC) 09/11/2016   Anemia    Chronic kidney disease    ARF on CRF Dialysis T/TH/Sa   Diabetes mellitus    Type II   End stage renal disease on dialysis (HCC)    [place]    GERD (gastroesophageal reflux disease)    diet controlled   HLD (hyperlipidemia)    Hypertension    Hypothyroidism    Morbid obesity (HCC)    Sacral wound    resolved   Thyroid disease     Family History  Problem Relation Age of Onset  Heart disease Mother    Hypertension Mother     Past Surgical History:  Procedure Laterality Date   AMPUTATION Left 05/08/2021   Procedure: AMPUTATION GREAT TOE;  Surgeon: Nadara Mustard, MD;  Location: [facility] OR;  Service: Orthopedics;  Laterality: Left;   AMPUTATION Left 10/01/2022   Procedure: LEFT FOOT AMPUTATION RAY 1st;  Surgeon: Nadara Mustard, MD;  Location: [facility] OR;  Service: Orthopedics;  Laterality: Left;   AV FISTULA PLACEMENT Left 09/26/2016   Procedure: LEFT UPPER ARM ARTERIOVENOUS (AV) FISTULA CREATION;  Surgeon: Sherren Kerns, MD;  Location: [facility] OR;  Service:  Vascular;  Laterality: Left;   AV FISTULA PLACEMENT Left 12/02/2016   Procedure: INSERTION OF ARTERIOVENOUS GORE-TEX GRAFT LEFT UPPER  ARM USING A 4-7MM BY 45CM GRAFT ;  Surgeon: Larina Earthly, MD;  Location: [facility] OR;  Service: Vascular;  Laterality: Left;   AV FISTULA PLACEMENT Right 07/03/2020   Procedure: INSERTION OF ARTERIOVENOUS (AV) GORE-TEX GRAFT RIGHT UPPER ARM;  Surgeon: Cephus Shelling, MD;  Location: [facility] OR;  Service: Vascular;  Laterality: Right;   AV FISTULA PLACEMENT Left 07/02/2021   Procedure: LEFT UPPER EXTREMITY ARTERIOVENOUS FISTULA CREATION;  Surgeon: Maeola Harman, MD;  Location: [facility] OR;  Service: Vascular;  Laterality: Left;   BASCILIC VEIN TRANSPOSITION Right 05/06/2020   Procedure: FIRST STAGE RIGHT BASCILIC VEIN TRANSPOSITION;  Surgeon: Nada Libman, MD;  Location: [facility] OR;  Service: Vascular;  Laterality: Right;   BASCILIC VEIN TRANSPOSITION Left 08/20/2021   Procedure: LEFT ARM SECOND STAGE WITH ARTERIOVENOUS GRAFT;  Surgeon: Maeola Harman, MD;  Location: [facility] OR;  Service: Vascular;  Laterality: Left;   EXCHANGE OF A DIALYSIS CATHETER Right 10/08/2016   Procedure: EXCHANGE OF A DIALYSIS CATHETER-RIGHT INTERNAL JUGULAR;  Surgeon: Fransisco Hertz, MD;  [facility]: [facility] OR;  Service: Vascular;  Laterality: Right;   INSERTION OF DIALYSIS CATHETER Right 09/26/2016   Procedure: INSERTION OF DIALYSIS CATHETER - Right Internal Jugular Placement;  Surgeon: Sherren Kerns, MD;  Location: [facility] OR;  Service: Vascular;  Laterality: Right;   INSERTION OF DIALYSIS CATHETER Left 03/22/2018   Procedure: INSERTION OF DIALYSIS CATHETER;  Surgeon: Cephus Shelling, MD;  Location: [facility] OR;  Service: Vascular;  Laterality: Left;   INTRAOPERATIVE ARTERIOGRAM Left 05/04/2022   Procedure: INTRA OPERATIVE ARTERIOGRAM;  Surgeon: Victorino Sparrow, MD;  Location: [facility] OR;  Service: Vascular;  Laterality: Left;   IR AV DIALY SHUNT INTRO NEEDLE/INTRAC INITIAL W/PTA/STENT/IMG LT Left  02/17/2023   IR AV DIALY SHUNT INTRO NEEDLE/INTRACATH INITIAL W/PTA/IMG LEFT  08/19/2022   IR AV DIALY SHUNT INTRO NEEDLE/INTRACATH INITIAL W/PTA/IMG LEFT  11/14/2022   IR DIALY SHUNT INTRO NEEDLE/INTRACATH INITIAL W/IMG LEFT Left 04/15/2022   IR FLUORO GUIDE CV LINE LEFT  04/19/2022   IR FLUORO GUIDE CV LINE LEFT  05/30/2022   IR FLUORO GUIDE CV LINE LEFT  03/24/2023   IR FLUORO GUIDE CV LINE RIGHT  03/26/2021   IR FLUORO GUIDE CV LINE RIGHT  05/13/2021   IR FLUORO GUIDE CV LINE RIGHT  05/04/2022   IR FLUORO GUIDE CV LINE RIGHT  02/17/2023   IR REMOVAL TUN CV CATH W/O FL  05/11/2021   IR REMOVAL TUN CV CATH W/O FL  10/06/2021   IR THROMBECTOMY AV FISTULA W/THROMBOLYSIS/PTA INC/SHUNT/IMG LEFT Left 02/02/2022   IR THROMBECTOMY AV FISTULA W/THROMBOLYSIS/PTA INC/SHUNT/IMG LEFT Left 03/22/2022   IR THROMBECTOMY AV FISTULA W/THROMBOLYSIS/PTA INC/SHUNT/IMG LEFT Left 05/04/2022   IR THROMBECTOMY AV FISTULA W/THROMBOLYSIS/PTA INC/SHUNT/IMG LEFT Left  12/09/2022   IR THROMBECTOMY AV FISTULA W/THROMBOLYSIS/PTA/STENT INC/SHUNT/IMG LT Left 04/27/2022   IR US GUIDE VASC ACCESS LEFT  02/02/2022   IR US GUIDE VASC ACCESS LEFT  03/22/2022   IR US GUIDE VASC ACCESS LEFT  04/15/2022   IR US GUIDE VASC ACCESS LEFT  04/19/2022   IR US GUIDE VASC ACCESS LEFT  04/27/2022   IR US GUIDE VASC ACCESS LEFT  12/13/2022   IR US GUIDE VASC ACCESS LEFT  02/21/2023   IR US GUIDE VASC ACCESS RIGHT  03/26/2021   IR US GUIDE VASC ACCESS RIGHT  05/13/2021   IR US GUIDE VASC ACCESS RIGHT  05/04/2022   IR US GUIDE VASC ACCESS RIGHT  02/17/2023   REVISON OF ARTERIOVENOUS FISTULA Left 03/22/2018   Procedure: LEFT UPPER EXTREMITY ARTERIOVENOUS  REVISION WITH GORE-TEX GRAFT.;  Surgeon: Cephus Shelling, MD;  Location: [facility] OR;  Service: Vascular;  Laterality: Left;   TEE WITHOUT CARDIOVERSION N/A 05/12/2021   Procedure: TRANSESOPHAGEAL ECHOCARDIOGRAM (TEE);  Surgeon: Christell Constant, MD;  Location: [facility] ENDOSCOPY;  Service:  Cardiovascular;  Laterality: N/A;   THROMBECTOMY Left 08/26/2019   thrombectomy of LUA loop AVG   THROMBECTOMY AND REVISION OF ARTERIOVENTOUS (AV) GORETEX  GRAFT Right 07/27/2020   Procedure: INSERTION OF RIGHT ARM LOOP GRAFT WITH EXCISION OF RIGHT ARM BRACHIAL AXILLARY GRAFT;  Surgeon: Cephus Shelling, MD;  Location: [facility] OR;  Service: Vascular;  Laterality: Right;   THROMBECTOMY BRACHIAL ARTERY Left 05/04/2022   Procedure: LEFT ARM RADIAL BRACHIAL ARTERY EMBOLECTOMY;  Surgeon: Victorino Sparrow, MD;  Location: [facility] OR;  Service: Vascular;  Laterality: Left;   UPPER EXTREMITY VENOGRAPHY Bilateral 06/14/2021   Procedure: UPPER EXTREMITY VENOGRAPHY;  Surgeon: Maeola Harman, MD;  Location: [facility] INVASIVE CV LAB;  Service: Cardiovascular;  Laterality: Bilateral;   VISCERAL ANGIOGRAPHY Right 06/17/2020   Procedure: CENTRAL VENO;  Surgeon: Leonie Douglas, MD;  Location: [facility] INVASIVE CV LAB;  Service: Cardiovascular;  Laterality: Right;   Social History   Occupational History   Not on file  Tobacco Use   Smoking status: Never   Smokeless tobacco: Never  Vaping Use   Vaping status: Never Used  Substance and Sexual Activity   Alcohol use: No   Drug use: Yes    Frequency: 2.0 times per week    Types: Marijuana    Comment: Last use was on 08/17/21   Sexual activity: Yes

## 2023-04-10 ENCOUNTER — Other Ambulatory Visit: Payer: Self-pay

## 2023-04-10 DIAGNOSIS — N186 End stage renal disease: Secondary | ICD-10-CM

## 2023-04-10 MED ORDER — SODIUM CHLORIDE 0.9 % IV SOLN: 250.0000 mL | INTRAVENOUS | Status: AC | PRN

## 2023-04-13 ENCOUNTER — Telehealth: Payer: Self-pay

## 2023-04-13 NOTE — Telephone Encounter (Signed)
Received a call from patient requesting to reschedule venogram on 11/25 to the following Monday, 12/2. Procedure rescheduled as requested and instructions reviewed. Patient voiced understanding.

## 2023-04-22 ENCOUNTER — Other Ambulatory Visit: Payer: Self-pay | Admitting: Student

## 2023-04-22 DIAGNOSIS — Z992 Dependence on renal dialysis: Secondary | ICD-10-CM | POA: Diagnosis not present

## 2023-04-22 DIAGNOSIS — N186 End stage renal disease: Secondary | ICD-10-CM | POA: Diagnosis not present

## 2023-04-22 DIAGNOSIS — Z794 Long term (current) use of insulin: Secondary | ICD-10-CM

## 2023-04-22 DIAGNOSIS — E1129 Type 2 diabetes mellitus with other diabetic kidney complication: Secondary | ICD-10-CM | POA: Diagnosis not present

## 2023-04-24 ENCOUNTER — Other Ambulatory Visit: Payer: Self-pay

## 2023-04-24 ENCOUNTER — Ambulatory Visit (HOSPITAL_COMMUNITY)
Admission: RE | Admit: 2023-04-24 | Discharge: 2023-04-24 | Disposition: A | Discharge status: Home / Self Care | Payer: Medicaid Other | Attending: Vascular Surgery | Admitting: Vascular Surgery

## 2023-04-24 ENCOUNTER — Encounter (HOSPITAL_COMMUNITY)
Admission: RE | Disposition: A | Discharge status: Home / Self Care | Payer: Self-pay | Source: Home / Self Care | Attending: Vascular Surgery

## 2023-04-24 DIAGNOSIS — I120 Hypertensive chronic kidney disease with stage 5 chronic kidney disease or end stage renal disease: Secondary | ICD-10-CM | POA: Diagnosis present

## 2023-04-24 DIAGNOSIS — N186 End stage renal disease: Secondary | ICD-10-CM

## 2023-04-24 DIAGNOSIS — E1122 Type 2 diabetes mellitus with diabetic chronic kidney disease: Secondary | ICD-10-CM | POA: Diagnosis not present

## 2023-04-24 DIAGNOSIS — N185 Chronic kidney disease, stage 5: Secondary | ICD-10-CM

## 2023-04-24 DIAGNOSIS — Z992 Dependence on renal dialysis: Secondary | ICD-10-CM | POA: Diagnosis not present

## 2023-04-24 DIAGNOSIS — Z794 Long term (current) use of insulin: Secondary | ICD-10-CM | POA: Insufficient documentation

## 2023-04-24 DIAGNOSIS — Z7901 Long term (current) use of anticoagulants: Secondary | ICD-10-CM | POA: Insufficient documentation

## 2023-04-24 DIAGNOSIS — R200 Anesthesia of skin: Secondary | ICD-10-CM | POA: Diagnosis not present

## 2023-04-24 HISTORY — PX: UPPER EXTREMITY VENOGRAPHY: CATH118272

## 2023-04-24 LAB — POCT I-STAT, CHEM 8
BUN: 82 mg/dL — ABNORMAL HIGH (ref 6–20)
Calcium, Ion: 1.04 mmol/L — ABNORMAL LOW (ref 1.15–1.40)
Chloride: 103 mmol/L (ref 98–111)
Creatinine, Ser: 17.1 mg/dL — ABNORMAL HIGH (ref 0.61–1.24)
Glucose, Bld: 141 mg/dL — ABNORMAL HIGH (ref 70–99)
HCT: 46 % (ref 39.0–52.0)
Hemoglobin: 15.6 g/dL (ref 13.0–17.0)
Potassium: 5.2 mmol/L — ABNORMAL HIGH (ref 3.5–5.1)
Sodium: 138 mmol/L (ref 135–145)
TCO2: 23 mmol/L (ref 22–32)

## 2023-04-24 LAB — GLUCOSE, CAPILLARY: Glucose-Capillary: 107 mg/dL — ABNORMAL HIGH (ref 70–99)

## 2023-04-24 SURGERY — UPPER EXTREMITY VENOGRAPHY
Anesthesia: LOCAL

## 2023-04-24 MED ORDER — SODIUM CHLORIDE 0.9% FLUSH: 3.0000 mL | INTRAVENOUS | Status: DC | PRN

## 2023-04-24 MED ORDER — SODIUM CHLORIDE 0.9% FLUSH: 3.0000 mL | Freq: Two times a day (BID) | INTRAVENOUS | Status: DC

## 2023-04-24 MED ORDER — IODIXANOL 320 MG/ML IV SOLN
INTRAVENOUS | Status: DC | PRN
  Administered 2023-04-24: 65 mL via INTRAVENOUS

## 2023-04-24 MED ORDER — HEPARIN (PORCINE) IN NACL 1000-0.9 UT/500ML-% IV SOLN
INTRAVENOUS | Status: DC | PRN
  Administered 2023-04-24: 500 mL

## 2023-04-24 SURGICAL SUPPLY — 2 items
STOPCOCK MORSE 400PSI 3WAY (MISCELLANEOUS) IMPLANT
TUBING CIL FLEX 10 FLL-RA (TUBING) IMPLANT

## 2023-04-24 NOTE — Op Note (Signed)
    Patient name: Paul [name] MRN: [id number] DOB: 1990-08-15 Sex: male  04/24/2023 Pre-operative Diagnosis: End-stage renal disease Post-operative diagnosis:  Same Surgeon:  Apolinar Junes C. Randie Heinz, MD Procedure Performed:   Bilateral upper extremity venography  Indications: 32 year old male with history of multiple previous upper extremity surgeries also required a brachial artery thromboembolectomy after graft on the left hand.  He is right-hand dominant does have previous graft on that side with multiple endovascular interventions is now indicated for bilateral upper extremity venography to plan future dialysis access.  Findings: On the right side there were multiple patent veins which appears to be patent in the deep system and then there is an axillary vein stent which is occluded however he appears to have impaired axillary system in the second system into the subclavian vein is patent and possibly has a basilic vein on the right side which could be used as a fistula.  On the left side there are multiple veins that collateralized around what appeared to be occluded subclavian axillary vein stents he has no further options for access on the left.  Plan will be for right arm AV fistula versus graft on a nondialysis day in the near future.  Patient dialyzes Tuesdays, Thursdays, and Saturdays   Procedure:  The patient was identified in the holding area and taken to room 8.  The patient was then placed supine on the table and his bilateral upper extremity IVs were accessed bilateral upper extremity venography was performed.  With the above findings we will plan for right arm access in the near future.  He tolerated procedure well without any complication.  Contrast: 65 cc    Maudy Yonan C. Randie Heinz, MD [facility] Office: [phone] Pager: [phone]

## 2023-04-24 NOTE — Interval H&P Note (Signed)
History and Physical Interval Note:  04/24/2023 9:43 AM  Paul [name]  [name] presented today for surgery, with the diagnosis of instage renal.  The various methods of treatment have been discussed with the patient and family. After consideration of risks, benefits and other options for treatment, the patient [name] consented to  Procedure(s): UPPER EXTREMITY VENOGRAPHY (N/A) as a surgical intervention.  The patient's history [name] been reviewed, patient examined, no change in status, stable for surgery.  I have reviewed the patient's chart and labs.  Questions were answered to the patient's satisfaction.     Lemar Livings

## 2023-04-25 ENCOUNTER — Encounter (HOSPITAL_COMMUNITY): Payer: Self-pay | Admitting: Vascular Surgery

## 2023-04-26 ENCOUNTER — Ambulatory Visit: Payer: Medicaid Other | Admitting: Family

## 2023-04-26 ENCOUNTER — Encounter: Payer: Self-pay | Admitting: Family

## 2023-04-26 DIAGNOSIS — Z89432 Acquired absence of left foot: Secondary | ICD-10-CM

## 2023-04-26 DIAGNOSIS — L97521 Non-pressure chronic ulcer of other part of left foot limited to breakdown of skin: Secondary | ICD-10-CM

## 2023-04-26 MED ORDER — OXYCODONE HCL 5 MG PO TABS: 5.0000 mg | ORAL_TABLET | Freq: Two times a day (BID) | ORAL | 0 refills | Status: DC | PRN

## 2023-04-26 NOTE — Progress Notes (Signed)
Post-Op Visit Note   Patient: Paul [name]           Date of Birth: [birth year]           MRN: [id number] Visit Date: 04/26/2023 PCP: Darral Dash, DO  Chief Complaint:  Chief Complaint  Patient presents with   Left Foot - Follow-up    10/01/2022 left 1st ray amputation     HPI:  HPI The patient is a 32 year old gentleman seen status post left first ray amputation this has been slow to heal.  He has buildup of hyper trophic callus today. Ortho Exam On examination of the left foot the first ray amputation continues to gradually improve.  This was debrided with a 10 blade knife of hyperkeratotic tissue back to viable tissue there is one remaining open area proximally this is 1 cm in diameter with 5 mm of depth. there is no necrotic tissue does not probe to bone no sign of infection  Visit Diagnoses:  No diagnosis found.   Plan: Continue daily Dial soap cleansing.  May begin dry dressing changes.  Will follow-up in 2 weeks.  Follow-Up Instructions: Return in about 2 weeks (around 05/10/2023).   Imaging: No results found.  Orders:  No orders of the defined types were placed in this encounter.  No orders of the defined types were placed in this encounter.    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   Thrombus 05/04/2022   Ischemia of left upper extremity 05/04/2022   Complication of vascular access for dialysis 04/19/2022   ESRD (end stage renal disease) (HCC) 04/19/2022   Failure of hemodialysis access (HCC) 04/18/2022   Left hand pain 09/22/2021   Diabetic foot infection (HCC)    Wound infection 05/07/2021   MSSA bacteremia 05/07/2021   Osteomyelitis (HCC) 05/07/2021   Abscess 05/07/2021   Sepsis due to cellulitis (HCC) 05/06/2021   Poor compliance with medication 03/17/2021   Fluid overload, unspecified  07/09/2020   Complication of vascular dialysis catheter 03/23/2020   Other disorders of phosphorus metabolism 01/28/2020   Hypertension 01/08/2020   Hypocalcemia 10/29/2019   ESRD on dialysis (HCC) 11/16/2017   Pruritus, unspecified 04/04/2017   Unspecified protein-calorie malnutrition (HCC) 11/11/2016   Headache, unspecified 10/29/2016   Encounter for immunization 10/19/2016   Iron deficiency anemia, unspecified 10/10/2016   Anemia in chronic kidney disease 09/30/2016   Coagulation defect, unspecified (HCC) 09/30/2016   Pneumonia due to Pseudomonas (HCC) 09/30/2016   Secondary hyperparathyroidism of renal origin (HCC) 09/30/2016   Hypothyroidism 10/29/2009   Type 2 diabetes mellitus (HCC) 04/14/2009   Hyperlipidemia 04/14/2009   OBESITY, MORBID 04/14/2009   Essential hypertension, benign 04/14/2009   Past Medical History:  Diagnosis Date   Acute hypoxemic respiratory failure (HCC) 09/11/2016   Anemia    Chronic kidney disease    ARF on CRF Dialysis T/TH/Sa   Diabetes mellitus    Type II   End stage renal disease on dialysis (HCC)    [place]    GERD (gastroesophageal reflux disease)    diet controlled   HLD (hyperlipidemia)    Hypertension    Hypothyroidism    Morbid obesity (HCC)    Sacral wound    resolved   Thyroid disease     Family History  Problem Relation Age of Onset   Heart disease Mother  Hypertension Mother     Past Surgical History:  Procedure Laterality Date   AMPUTATION Left 05/08/2021   Procedure: AMPUTATION GREAT TOE;  Surgeon: Nadara Mustard, MD;  Location: [facility] OR;  Service: Orthopedics;  Laterality: Left;   AMPUTATION Left 10/01/2022   Procedure: LEFT FOOT AMPUTATION RAY 1st;  Surgeon: Nadara Mustard, MD;  Location: [facility] OR;  Service: Orthopedics;  Laterality: Left;   AV FISTULA PLACEMENT Left 09/26/2016   Procedure: LEFT UPPER ARM ARTERIOVENOUS (AV) FISTULA CREATION;  Surgeon: Sherren Kerns, MD;  Location: [facility] OR;  Service: Vascular;   Laterality: Left;   AV FISTULA PLACEMENT Left 12/02/2016   Procedure: INSERTION OF ARTERIOVENOUS GORE-TEX GRAFT LEFT UPPER  ARM USING A 4-7MM BY 45CM GRAFT ;  Surgeon: Larina Earthly, MD;  Location: [facility] OR;  Service: Vascular;  Laterality: Left;   AV FISTULA PLACEMENT Right 07/03/2020   Procedure: INSERTION OF ARTERIOVENOUS (AV) GORE-TEX GRAFT RIGHT UPPER ARM;  Surgeon: Cephus Shelling, MD;  Location: [facility] OR;  Service: Vascular;  Laterality: Right;   AV FISTULA PLACEMENT Left 07/02/2021   Procedure: LEFT UPPER EXTREMITY ARTERIOVENOUS FISTULA CREATION;  Surgeon: Maeola Harman, MD;  Location: [facility] OR;  Service: Vascular;  Laterality: Left;   BASCILIC VEIN TRANSPOSITION Right 05/06/2020   Procedure: FIRST STAGE RIGHT BASCILIC VEIN TRANSPOSITION;  Surgeon: Nada Libman, MD;  Location: [facility] OR;  Service: Vascular;  Laterality: Right;   BASCILIC VEIN TRANSPOSITION Left 08/20/2021   Procedure: LEFT ARM SECOND STAGE WITH ARTERIOVENOUS GRAFT;  Surgeon: Maeola Harman, MD;  Location: [facility] OR;  Service: Vascular;  Laterality: Left;   EXCHANGE OF A DIALYSIS CATHETER Right 10/08/2016   Procedure: EXCHANGE OF A DIALYSIS CATHETER-RIGHT INTERNAL JUGULAR;  Surgeon: Fransisco Hertz, MD;  Location: [facility] OR;  Service: Vascular;  Laterality: Right;   INSERTION OF DIALYSIS CATHETER Right 09/26/2016   Procedure: INSERTION OF DIALYSIS CATHETER - Right Internal Jugular Placement;  Surgeon: Sherren Kerns, MD;  Location: [facility] OR;  Service: Vascular;  Laterality: Right;   INSERTION OF DIALYSIS CATHETER Left 03/22/2018   Procedure: INSERTION OF DIALYSIS CATHETER;  Surgeon: Cephus Shelling, MD;  Location: [facility] OR;  Service: Vascular;  Laterality: Left;   INTRAOPERATIVE ARTERIOGRAM Left 05/04/2022   Procedure: INTRA OPERATIVE ARTERIOGRAM;  Surgeon: Victorino Sparrow, MD;  Location: [facility] OR;  Service: Vascular;  Laterality: Left;   IR AV DIALY SHUNT INTRO NEEDLE/INTRAC INITIAL W/PTA/STENT/IMG LT Left 02/17/2023    IR AV DIALY SHUNT INTRO NEEDLE/INTRACATH INITIAL W/PTA/IMG LEFT  08/19/2022   IR AV DIALY SHUNT INTRO NEEDLE/INTRACATH INITIAL W/PTA/IMG LEFT  11/14/2022   IR DIALY SHUNT INTRO NEEDLE/INTRACATH INITIAL W/IMG LEFT Left 04/15/2022   IR FLUORO GUIDE CV LINE LEFT  04/19/2022   IR FLUORO GUIDE CV LINE LEFT  05/30/2022   IR FLUORO GUIDE CV LINE LEFT  03/24/2023   IR FLUORO GUIDE CV LINE RIGHT  03/26/2021   IR FLUORO GUIDE CV LINE RIGHT  05/13/2021   IR FLUORO GUIDE CV LINE RIGHT  05/04/2022   IR FLUORO GUIDE CV LINE RIGHT  02/17/2023   IR REMOVAL TUN CV CATH W/O FL  05/11/2021   IR REMOVAL TUN CV CATH W/O FL  10/06/2021   IR THROMBECTOMY AV FISTULA W/THROMBOLYSIS/PTA INC/SHUNT/IMG LEFT Left 02/02/2022   IR THROMBECTOMY AV FISTULA W/THROMBOLYSIS/PTA INC/SHUNT/IMG LEFT Left 03/22/2022   IR THROMBECTOMY AV FISTULA W/THROMBOLYSIS/PTA INC/SHUNT/IMG LEFT Left 05/04/2022   IR THROMBECTOMY AV FISTULA W/THROMBOLYSIS/PTA INC/SHUNT/IMG LEFT Left 12/09/2022   IR THROMBECTOMY AV  FISTULA W/THROMBOLYSIS/PTA/STENT INC/SHUNT/IMG LT Left 04/27/2022   IR US GUIDE VASC ACCESS LEFT  02/02/2022   IR US GUIDE VASC ACCESS LEFT  03/22/2022   IR US GUIDE VASC ACCESS LEFT  04/15/2022   IR US GUIDE VASC ACCESS LEFT  04/19/2022   IR US GUIDE VASC ACCESS LEFT  04/27/2022   IR US GUIDE VASC ACCESS LEFT  12/13/2022   IR US GUIDE VASC ACCESS LEFT  02/21/2023   IR US GUIDE VASC ACCESS RIGHT  03/26/2021   IR US GUIDE VASC ACCESS RIGHT  05/13/2021   IR US GUIDE VASC ACCESS RIGHT  05/04/2022   IR US GUIDE VASC ACCESS RIGHT  02/17/2023   REVISON OF ARTERIOVENOUS FISTULA Left 03/22/2018   Procedure: LEFT UPPER EXTREMITY ARTERIOVENOUS  REVISION WITH GORE-TEX GRAFT.;  Surgeon: Cephus Shelling, MD;  Location: [facility] OR;  Service: Vascular;  Laterality: Left;   TEE WITHOUT CARDIOVERSION N/A 05/12/2021   Procedure: TRANSESOPHAGEAL ECHOCARDIOGRAM (TEE);  Surgeon: Christell Constant, MD;  Location: [facility] ENDOSCOPY;  Service: Cardiovascular;   Laterality: N/A;   THROMBECTOMY Left 08/26/2019   thrombectomy of LUA loop AVG   THROMBECTOMY AND REVISION OF ARTERIOVENTOUS (AV) GORETEX  GRAFT Right 07/27/2020   Procedure: INSERTION OF RIGHT ARM LOOP GRAFT WITH EXCISION OF RIGHT ARM BRACHIAL AXILLARY GRAFT;  Surgeon: Cephus Shelling, MD;  Location: [facility] OR;  Service: Vascular;  Laterality: Right;   THROMBECTOMY BRACHIAL ARTERY Left 05/04/2022   Procedure: LEFT ARM RADIAL BRACHIAL ARTERY EMBOLECTOMY;  Surgeon: Victorino Sparrow, MD;  Location: [facility] OR;  Service: Vascular;  Laterality: Left;   UPPER EXTREMITY VENOGRAPHY Bilateral 06/14/2021   Procedure: UPPER EXTREMITY VENOGRAPHY;  Surgeon: Maeola Harman, MD;  Location: [facility] INVASIVE CV LAB;  Service: Cardiovascular;  Laterality: Bilateral;   UPPER EXTREMITY VENOGRAPHY N/A 04/24/2023   Procedure: UPPER EXTREMITY VENOGRAPHY;  Surgeon: Maeola Harman, MD;  Location: [facility] INVASIVE CV LAB;  Service: Cardiovascular;  Laterality: N/A;   VISCERAL ANGIOGRAPHY Right 06/17/2020   Procedure: CENTRAL VENO;  Surgeon: Leonie Douglas, MD;  Location: [facility] INVASIVE CV LAB;  Service: Cardiovascular;  Laterality: Right;   Social History   Occupational History   Not on file  Tobacco Use   Smoking status: Never   Smokeless tobacco: Never  Vaping Use   Vaping status: Never Used  Substance and Sexual Activity   Alcohol use: No   Drug use: Yes    Frequency: 2.0 times per week    Types: Marijuana    Comment: Last use was on 08/17/21   Sexual activity: Yes

## 2023-05-01 NOTE — Addendum Note (Signed)
Encounter addended by: Edward Qualia on: 05/01/2023 10:12 AM  Actions taken: Imaging Exam ended

## 2023-05-10 ENCOUNTER — Ambulatory Visit (INDEPENDENT_AMBULATORY_CARE_PROVIDER_SITE_OTHER): Payer: Medicaid Other | Admitting: Family

## 2023-05-10 ENCOUNTER — Encounter: Payer: Self-pay | Admitting: Family

## 2023-05-10 DIAGNOSIS — E11628 Type 2 diabetes mellitus with other skin complications: Secondary | ICD-10-CM

## 2023-05-10 DIAGNOSIS — L089 Local infection of the skin and subcutaneous tissue, unspecified: Secondary | ICD-10-CM

## 2023-05-10 NOTE — Progress Notes (Signed)
Post-Op Visit Note   Patient: Paul [name]           Date of Birth: 1990/06/11           MRN: [id number] Visit Date: 05/10/2023 PCP: Darral Dash, DO  Chief Complaint:  Chief Complaint  Patient presents with   Left Foot - Follow-up    10/01/2022 left 1st ray amputation    HPI:  HPI The patient is a 32 year old gentleman seen status post left first ray amputation this has been slow to heal.  He has buildup of callus today. Ortho Exam Wound is stable.  On examination of the left foot the first ray amputation continues to gradually improve.  This was debrided with a 10 blade knife of hyperkeratotic tissue back to viable tissue there is one remaining open area proximally this is 1 cm in diameter with 5 mm of depth. there is no necrotic tissue does not probe to bone no sign of infection  Visit Diagnoses:  No diagnosis found.   Plan: Continue daily Dial soap cleansing.  May continue dry dressing changes.  Pack open. will follow-up in 2 weeks.  Follow-Up Instructions: No follow-ups on file.   Imaging: No results found.  Orders:  No orders of the defined types were placed in this encounter.  No orders of the defined types were placed in this encounter.    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   Thrombus 05/04/2022   Ischemia of left upper extremity 05/04/2022   Complication of vascular access for dialysis 04/19/2022   ESRD (end stage renal disease) (HCC) 04/19/2022   Failure of hemodialysis access (HCC) 04/18/2022   Left hand pain 09/22/2021   Diabetic foot infection (HCC)    Wound infection 05/07/2021   MSSA bacteremia 05/07/2021   Osteomyelitis (HCC) 05/07/2021   Abscess 05/07/2021   Sepsis due to cellulitis (HCC) 05/06/2021   Poor compliance with medication 03/17/2021   Fluid overload, unspecified  07/09/2020   Complication of vascular dialysis catheter 03/23/2020   Other disorders of phosphorus metabolism 01/28/2020   Hypertension 01/08/2020   Hypocalcemia 10/29/2019   ESRD on dialysis (HCC) 11/16/2017   Pruritus, unspecified 04/04/2017   Unspecified protein-calorie malnutrition (HCC) 11/11/2016   Headache, unspecified 10/29/2016   Encounter for immunization 10/19/2016   Iron deficiency anemia, unspecified 10/10/2016   Anemia in chronic kidney disease 09/30/2016   Coagulation defect, unspecified (HCC) 09/30/2016   Pneumonia due to Pseudomonas (HCC) 09/30/2016   Secondary hyperparathyroidism of renal origin (HCC) 09/30/2016   Hypothyroidism 10/29/2009   Type 2 diabetes mellitus (HCC) 04/14/2009   Hyperlipidemia 04/14/2009   OBESITY, MORBID 04/14/2009   Essential hypertension, benign 04/14/2009   Past Medical History:  Diagnosis Date   Acute hypoxemic respiratory failure (HCC) 09/11/2016   Anemia    Chronic kidney disease    ARF on CRF Dialysis T/TH/Sa   Diabetes mellitus    Type II   End stage renal disease on dialysis (HCC)    [place]    GERD (gastroesophageal reflux disease)    diet controlled   HLD (hyperlipidemia)    Hypertension    Hypothyroidism    Morbid obesity (HCC)    Sacral wound    resolved   Thyroid disease     Family History  Problem Relation Age of Onset   Heart disease Mother  Hypertension Mother     Past Surgical History:  Procedure Laterality Date   AMPUTATION Left 05/08/2021   Procedure: AMPUTATION GREAT TOE;  Surgeon: Nadara Mustard, MD;  Location: [facility] OR;  Service: Orthopedics;  Laterality: Left;   AMPUTATION Left 10/01/2022   Procedure: LEFT FOOT AMPUTATION RAY 1st;  Surgeon: Nadara Mustard, MD;  Location: [facility] OR;  Service: Orthopedics;  Laterality: Left;   AV FISTULA PLACEMENT Left 09/26/2016   Procedure: LEFT UPPER ARM ARTERIOVENOUS (AV) FISTULA CREATION;  Surgeon: Sherren Kerns, MD;  Location: [facility] OR;  Service: Vascular;   Laterality: Left;   AV FISTULA PLACEMENT Left 12/02/2016   Procedure: INSERTION OF ARTERIOVENOUS GORE-TEX GRAFT LEFT UPPER  ARM USING A 4-7MM BY 45CM GRAFT ;  Surgeon: Larina Earthly, MD;  Location: [facility] OR;  Service: Vascular;  Laterality: Left;   AV FISTULA PLACEMENT Right 07/03/2020   Procedure: INSERTION OF ARTERIOVENOUS (AV) GORE-TEX GRAFT RIGHT UPPER ARM;  Surgeon: Cephus Shelling, MD;  Location: [facility] OR;  Service: Vascular;  Laterality: Right;   AV FISTULA PLACEMENT Left 07/02/2021   Procedure: LEFT UPPER EXTREMITY ARTERIOVENOUS FISTULA CREATION;  Surgeon: Maeola Harman, MD;  Location: [facility] OR;  Service: Vascular;  Laterality: Left;   BASCILIC VEIN TRANSPOSITION Right 05/06/2020   Procedure: FIRST STAGE RIGHT BASCILIC VEIN TRANSPOSITION;  Surgeon: Nada Libman, MD;  Location: [facility] OR;  Service: Vascular;  Laterality: Right;   BASCILIC VEIN TRANSPOSITION Left 08/20/2021   Procedure: LEFT ARM SECOND STAGE WITH ARTERIOVENOUS GRAFT;  Surgeon: Maeola Harman, MD;  Location: [facility] OR;  Service: Vascular;  Laterality: Left;   EXCHANGE OF A DIALYSIS CATHETER Right 10/08/2016   Procedure: EXCHANGE OF A DIALYSIS CATHETER-RIGHT INTERNAL JUGULAR;  Surgeon: Fransisco Hertz, MD;  Location: [facility] OR;  Service: Vascular;  Laterality: Right;   INSERTION OF DIALYSIS CATHETER Right 09/26/2016   Procedure: INSERTION OF DIALYSIS CATHETER - Right Internal Jugular Placement;  Surgeon: Sherren Kerns, MD;  Location: [facility] OR;  Service: Vascular;  Laterality: Right;   INSERTION OF DIALYSIS CATHETER Left 03/22/2018   Procedure: INSERTION OF DIALYSIS CATHETER;  Surgeon: Cephus Shelling, MD;  Location: [facility] OR;  Service: Vascular;  Laterality: Left;   INTRAOPERATIVE ARTERIOGRAM Left 05/04/2022   Procedure: INTRA OPERATIVE ARTERIOGRAM;  Surgeon: Victorino Sparrow, MD;  Location: [facility] OR;  Service: Vascular;  Laterality: Left;   IR AV DIALY SHUNT INTRO NEEDLE/INTRAC INITIAL W/PTA/STENT/IMG LT Left 02/17/2023    IR AV DIALY SHUNT INTRO NEEDLE/INTRACATH INITIAL W/PTA/IMG LEFT  08/19/2022   IR AV DIALY SHUNT INTRO NEEDLE/INTRACATH INITIAL W/PTA/IMG LEFT  11/14/2022   IR DIALY SHUNT INTRO NEEDLE/INTRACATH INITIAL W/IMG LEFT Left 04/15/2022   IR FLUORO GUIDE CV LINE LEFT  04/19/2022   IR FLUORO GUIDE CV LINE LEFT  05/30/2022   IR FLUORO GUIDE CV LINE LEFT  03/24/2023   IR FLUORO GUIDE CV LINE RIGHT  03/26/2021   IR FLUORO GUIDE CV LINE RIGHT  05/13/2021   IR FLUORO GUIDE CV LINE RIGHT  05/04/2022   IR FLUORO GUIDE CV LINE RIGHT  02/17/2023   IR REMOVAL TUN CV CATH W/O FL  05/11/2021   IR REMOVAL TUN CV CATH W/O FL  10/06/2021   IR THROMBECTOMY AV FISTULA W/THROMBOLYSIS/PTA INC/SHUNT/IMG LEFT Left 02/02/2022   IR THROMBECTOMY AV FISTULA W/THROMBOLYSIS/PTA INC/SHUNT/IMG LEFT Left 03/22/2022   IR THROMBECTOMY AV FISTULA W/THROMBOLYSIS/PTA INC/SHUNT/IMG LEFT Left 05/04/2022   IR THROMBECTOMY AV FISTULA W/THROMBOLYSIS/PTA INC/SHUNT/IMG LEFT Left 12/09/2022   IR THROMBECTOMY AV  FISTULA W/THROMBOLYSIS/PTA/STENT INC/SHUNT/IMG LT Left 04/27/2022   IR US GUIDE VASC ACCESS LEFT  02/02/2022   IR US GUIDE VASC ACCESS LEFT  03/22/2022   IR US GUIDE VASC ACCESS LEFT  04/15/2022   IR US GUIDE VASC ACCESS LEFT  04/19/2022   IR US GUIDE VASC ACCESS LEFT  04/27/2022   IR US GUIDE VASC ACCESS LEFT  12/13/2022   IR US GUIDE VASC ACCESS LEFT  02/21/2023   IR US GUIDE VASC ACCESS RIGHT  03/26/2021   IR US GUIDE VASC ACCESS RIGHT  05/13/2021   IR US GUIDE VASC ACCESS RIGHT  05/04/2022   IR US GUIDE VASC ACCESS RIGHT  02/17/2023   REVISON OF ARTERIOVENOUS FISTULA Left 03/22/2018   Procedure: LEFT UPPER EXTREMITY ARTERIOVENOUS  REVISION WITH GORE-TEX GRAFT.;  Surgeon: Cephus Shelling, MD;  Location: [facility] OR;  Service: Vascular;  Laterality: Left;   TEE WITHOUT CARDIOVERSION N/A 05/12/2021   Procedure: TRANSESOPHAGEAL ECHOCARDIOGRAM (TEE);  Surgeon: Christell Constant, MD;  Location[facility] ENDOSCOPY;  Service: Cardiovascular;   Laterality: N/A;   THROMBECTOMY Left 08/26/2019   thrombectomy of LUA loop AVG   THROMBECTOMY AND REVISION OF ARTERIOVENTOUS (AV) GORETEX  GRAFT Right 07/27/2020   Procedure: INSERTION OF RIGHT ARM LOOP GRAFT WITH EXCISION OF RIGHT ARM BRACHIAL AXILLARY GRAFT;  Surgeon: Cephus Shelling, MD;  Location: [facility] OR;  Service: Vascular;  Laterality: Right;   THROMBECTOMY BRACHIAL ARTERY Left 05/04/2022   Procedure: LEFT ARM RADIAL BRACHIAL ARTERY EMBOLECTOMY;  Surgeon: Victorino Sparrow, MD;  Location: [facility] OR;  Service: Vascular;  Laterality: Left;   UPPER EXTREMITY VENOGRAPHY Bilateral 06/14/2021   Procedure: UPPER EXTREMITY VENOGRAPHY;  Surgeon: Maeola Harman, MD;  Location: [facility] INVASIVE CV LAB;  Service: Cardiovascular;  Laterality: Bilateral;   UPPER EXTREMITY VENOGRAPHY N/A 04/24/2023   Procedure: UPPER EXTREMITY VENOGRAPHY;  Surgeon: Maeola Harman, MD;  Location: [facility] INVASIVE CV LAB;  Service: Cardiovascular;  Laterality: N/A;   VISCERAL ANGIOGRAPHY Right 06/17/2020   Procedure: CENTRAL VENO;  Surgeon: Leonie Douglas, MD;  Location: [facility] INVASIVE CV LAB;  Service: Cardiovascular;  Laterality: Right;   Social History   Occupational History   Not on file  Tobacco Use   Smoking status: Never   Smokeless tobacco: Never  Vaping Use   Vaping status: Never Used  Substance and Sexual Activity   Alcohol use: No   Drug use: Yes    Frequency: 2.0 times per week    Types: Marijuana    Comment: Last use was on 08/17/21   Sexual activity: Yes

## 2023-05-11 ENCOUNTER — Encounter (HOSPITAL_COMMUNITY): Payer: Self-pay | Admitting: Vascular Surgery

## 2023-05-11 ENCOUNTER — Other Ambulatory Visit: Payer: Self-pay

## 2023-05-11 NOTE — Progress Notes (Addendum)
PCP - Darral Dash, DO  Cardiologist -   PPM/ICD - denies Device Orders - n/a Rep Notified - n/a  Chest x-ray - 07-30-22 EKG - 09-29-22 Stress Test -  ECHO - 05-09-21 Cardiac Cath -   CPAP - denies  GLP-1 - denies  Fasting Blood Sugar - per patient between 110-115 Checks Blood Sugar BID A1c 7.4 09-30-22  Blood Thinner Instructions: apixaban (ELIQUIS)  last dose 05-09-23 Aspirin Instructions: aspirin EC   ERAS Protcol - NPO  COVID TEST- no  Anesthesia review: yes  Patient verbally denies any shortness of breath, fever, cough and chest pain during phone call   -------------  SDW INSTRUCTIONS given:  Your procedure is scheduled on May 12, 2023.  Report to [facility] Main Entrance "A" at 6:20 A.M., and check in at the Admitting office.  Call this number if you have problems the morning of surgery:  [phone]   Remember:  Do not eat or drink after midnight the night before your surgery      Take these medicines the morning of surgery with A SIP OF WATER  gabapentin (NEURONTIN)  levothyroxine (SYNTHROID)  rosuvastatin (CRESTOR)   IF NEEDED  As of today, STOP taking any Aspirin (unless otherwise instructed by your surgeon) Aleve, Naproxen, Ibuprofen, Motrin, Advil, Goody's, BC's, all herbal medications, fish oil, and all vitamins.          WHAT DO I DO ABOUT MY DIABETES MEDICATION?   Do not take oral diabetes medicines (pills) the morning of surgery.  THE NIGHT BEFORE SURGERY, take __ units of LANTUS insulin.       THE MORNING OF SURGERY, take _units of_LANTUS insulin.  The day of surgery, do not take other diabetes injectables, including Byetta (exenatide), Bydureon (exenatide ER), Victoza (liraglutide), or Trulicity (dulaglutide).  If your CBG is greater than 220 mg/dL, you may take  of your sliding scale (correction) dose of insulin.   HOW TO MANAGE YOUR DIABETES BEFORE AND AFTER SURGERY  Why is it important to control my blood sugar before  and after surgery? Improving blood sugar levels before and after surgery helps healing and can limit problems. A way of improving blood sugar control is eating a healthy diet by:  Eating less sugar and carbohydrates  Increasing activity/exercise  Talking with your doctor about reaching your blood sugar goals High blood sugars (greater than 180 mg/dL) can raise your risk of infections and slow your recovery, so you will need to focus on controlling your diabetes during the weeks before surgery. Make sure that the doctor who takes care of your diabetes knows about your planned surgery including the date and location.  How do I manage my blood sugar before surgery? Check your blood sugar at least 4 times a day, starting 2 days before surgery, to make sure that the level is not too high or low.  Check your blood sugar the morning of your surgery when you wake up and every 2 hours until you get to the Short Stay unit.  If your blood sugar is less than 70 mg/dL, you will need to treat for low blood sugar: Do not take insulin. Treat a low blood sugar (less than 70 mg/dL) with  cup of clear juice (cranberry or apple), 4 glucose tablets, OR glucose gel. Recheck blood sugar in 15 minutes after treatment (to make sure it is greater than 70 mg/dL). If your blood sugar is not greater than 70 mg/dL on recheck, call [phone] for further instructions. Report  your blood sugar to the short stay nurse when you get to Short Stay.  If you are admitted to the hospital after surgery: Your blood sugar will be checked by the staff and you will probably be given insulin after surgery (instead of oral diabetes medicines) to make sure you have good blood sugar levels. The goal for blood sugar control after surgery is 80-180 mg/dL.             Do not wear jewelry, make up, or nail polish            Do not wear lotions, powders, perfumes/colognes, or deodorant.            Do not shave 48 hours prior to surgery.   Men may shave face and neck.            Do not bring valuables to the hospital.            [facility] is not responsible for any belongings or valuables.  Do NOT Smoke (Tobacco/Vaping) 24 hours prior to your procedure If you use a CPAP at night, you may bring all equipment for your overnight stay.   Contacts, glasses, dentures or bridgework may not be worn into surgery.      For patients admitted to the hospital, discharge time will be determined by your treatment team.   Patients discharged the day of surgery will not be allowed to drive home, and someone needs to stay with them for 24 hours.    Special instructions:   [place]- Preparing For Surgery  Before surgery, you can play an important role. Because skin is not sterile, your skin needs to be as free of germs as possible. You can reduce the number of germs on your skin by washing with CHG (chlorahexidine gluconate) Soap before surgery.  CHG is an antiseptic cleaner which kills germs and bonds with the skin to continue killing germs even after washing.    Oral Hygiene is also important to reduce your risk of infection.  Remember - BRUSH YOUR TEETH THE MORNING OF SURGERY WITH YOUR REGULAR TOOTHPASTE  Please do not use if you have an allergy to CHG or antibacterial soaps. If your skin becomes reddened/irritated stop using the CHG.  Do not shave (including legs and underarms) for at least 48 hours prior to first CHG shower. It is OK to shave your face.  Please follow these instructions carefully.   Shower the NIGHT BEFORE SURGERY and the MORNING OF SURGERY with DIAL Soap.   Pat yourself dry with a CLEAN TOWEL.  Wear CLEAN PAJAMAS to bed the night before surgery  Place CLEAN SHEETS on your bed the night of your first shower and DO NOT SLEEP WITH PETS.   Day of Surgery: Please shower morning of surgery  Wear Clean/Comfortable clothing the morning of surgery Do not apply any deodorants/lotions.   Remember to brush your  teeth WITH YOUR REGULAR TOOTHPASTE.   Questions were answered. Patient verbalized understanding of instructions.

## 2023-05-11 NOTE — Anesthesia Preprocedure Evaluation (Addendum)
Anesthesia Evaluation  Patient identified by MRN, date of birth, ID band Patient awake    Reviewed: Allergy & Precautions, NPO status , Patient's Chart, lab work & pertinent test results  Airway Mallampati: III  TM Distance: >3 FB Neck ROM: Full    Dental  (+) Dental Advisory Given   Pulmonary neg pulmonary ROS   breath sounds clear to auscultation       Cardiovascular hypertension, Pt. on medications + Peripheral Vascular Disease and + DVT   Rhythm:Regular Rate:Normal     Neuro/Psych negative neurological ROS     GI/Hepatic Neg liver ROS,GERD  ,,  Endo/Other  diabetes, Type 2, Insulin DependentHypothyroidism  Class 3 obesity  Renal/GU ESRF and DialysisRenal disease     Musculoskeletal   Abdominal   Peds  Hematology  (+) Blood dyscrasia, anemia   Anesthesia Other Findings   Reproductive/Obstetrics                             Anesthesia Physical Anesthesia Plan  ASA: 4  Anesthesia Plan: General   Post-op Pain Management: Tylenol PO (pre-op)*   Induction: Intravenous  PONV Risk Score and Plan: 2 and Dexamethasone, Ondansetron and Treatment may vary due to age or medical condition  Airway Management Planned: LMA  Additional Equipment:   Intra-op Plan:   Post-operative Plan: Extubation in OR  Informed Consent: I have reviewed the patients History and Physical, chart, labs and discussed the procedure including the risks, benefits and alternatives for the proposed anesthesia with the patient or authorized representative who has indicated his/her understanding and acceptance.     Dental advisory given  Plan Discussed with: CRNA  Anesthesia Plan Comments:         Anesthesia Quick Evaluation

## 2023-05-12 ENCOUNTER — Ambulatory Visit (HOSPITAL_COMMUNITY): Payer: Medicaid Other | Admitting: Physician Assistant

## 2023-05-12 ENCOUNTER — Encounter (HOSPITAL_COMMUNITY)
Admission: RE | Disposition: A | Discharge status: Home / Self Care | Payer: Self-pay | Source: Home / Self Care | Attending: Vascular Surgery

## 2023-05-12 ENCOUNTER — Ambulatory Visit (HOSPITAL_BASED_OUTPATIENT_CLINIC_OR_DEPARTMENT_OTHER): Payer: Medicaid Other | Admitting: Physician Assistant

## 2023-05-12 ENCOUNTER — Ambulatory Visit (HOSPITAL_COMMUNITY)
Admission: RE | Admit: 2023-05-12 | Discharge: 2023-05-12 | Disposition: A | Discharge status: Home / Self Care | Payer: Medicaid Other | Attending: Vascular Surgery | Admitting: Vascular Surgery

## 2023-05-12 ENCOUNTER — Encounter (HOSPITAL_COMMUNITY): Payer: Self-pay | Admitting: Vascular Surgery

## 2023-05-12 ENCOUNTER — Other Ambulatory Visit: Payer: Self-pay

## 2023-05-12 DIAGNOSIS — E66813 Obesity, class 3: Secondary | ICD-10-CM | POA: Insufficient documentation

## 2023-05-12 DIAGNOSIS — I120 Hypertensive chronic kidney disease with stage 5 chronic kidney disease or end stage renal disease: Secondary | ICD-10-CM | POA: Diagnosis not present

## 2023-05-12 DIAGNOSIS — Z992 Dependence on renal dialysis: Secondary | ICD-10-CM | POA: Diagnosis not present

## 2023-05-12 DIAGNOSIS — Z6841 Body Mass Index (BMI) 40.0 and over, adult: Secondary | ICD-10-CM | POA: Insufficient documentation

## 2023-05-12 DIAGNOSIS — E1122 Type 2 diabetes mellitus with diabetic chronic kidney disease: Secondary | ICD-10-CM | POA: Diagnosis not present

## 2023-05-12 DIAGNOSIS — N186 End stage renal disease: Secondary | ICD-10-CM | POA: Insufficient documentation

## 2023-05-12 DIAGNOSIS — Z79899 Other long term (current) drug therapy: Secondary | ICD-10-CM | POA: Insufficient documentation

## 2023-05-12 DIAGNOSIS — Z7901 Long term (current) use of anticoagulants: Secondary | ICD-10-CM | POA: Insufficient documentation

## 2023-05-12 DIAGNOSIS — E1151 Type 2 diabetes mellitus with diabetic peripheral angiopathy without gangrene: Secondary | ICD-10-CM | POA: Insufficient documentation

## 2023-05-12 DIAGNOSIS — Z79890 Hormone replacement therapy (postmenopausal): Secondary | ICD-10-CM | POA: Diagnosis not present

## 2023-05-12 DIAGNOSIS — D500 Iron deficiency anemia secondary to blood loss (chronic): Secondary | ICD-10-CM | POA: Diagnosis not present

## 2023-05-12 DIAGNOSIS — K219 Gastro-esophageal reflux disease without esophagitis: Secondary | ICD-10-CM | POA: Insufficient documentation

## 2023-05-12 DIAGNOSIS — N185 Chronic kidney disease, stage 5: Secondary | ICD-10-CM | POA: Diagnosis not present

## 2023-05-12 DIAGNOSIS — Z86718 Personal history of other venous thrombosis and embolism: Secondary | ICD-10-CM | POA: Diagnosis not present

## 2023-05-12 DIAGNOSIS — E039 Hypothyroidism, unspecified: Secondary | ICD-10-CM | POA: Insufficient documentation

## 2023-05-12 DIAGNOSIS — Z794 Long term (current) use of insulin: Secondary | ICD-10-CM | POA: Diagnosis not present

## 2023-05-12 HISTORY — PX: AV FISTULA PLACEMENT: SHX1204

## 2023-05-12 LAB — GLUCOSE, CAPILLARY
Glucose-Capillary: 196 mg/dL — ABNORMAL HIGH (ref 70–99)
Glucose-Capillary: 114 mg/dL — ABNORMAL HIGH (ref 70–99)
Glucose-Capillary: 148 mg/dL — ABNORMAL HIGH (ref 70–99)

## 2023-05-12 LAB — POCT I-STAT, CHEM 8
BUN: 41 mg/dL — ABNORMAL HIGH (ref 6–20)
Calcium, Ion: 1.15 mmol/L (ref 1.15–1.40)
Chloride: 103 mmol/L (ref 98–111)
Creatinine, Ser: 14.4 mg/dL — ABNORMAL HIGH (ref 0.61–1.24)
Glucose, Bld: 151 mg/dL — ABNORMAL HIGH (ref 70–99)
HCT: 46 % (ref 39.0–52.0)
Hemoglobin: 15.6 g/dL (ref 13.0–17.0)
Potassium: 4.5 mmol/L (ref 3.5–5.1)
Sodium: 137 mmol/L (ref 135–145)
TCO2: 21 mmol/L — ABNORMAL LOW (ref 22–32)

## 2023-05-12 SURGERY — ARTERIOVENOUS (AV) FISTULA CREATION
Anesthesia: General | Site: Arm Upper | Laterality: Right

## 2023-05-12 MED ORDER — CHLORHEXIDINE GLUCONATE CLOTH 2 % EX PADS: 6.0000 | MEDICATED_PAD | Freq: Every day | CUTANEOUS | Status: DC

## 2023-05-12 MED ORDER — MIDAZOLAM HCL 2 MG/2ML IJ SOLN
INTRAMUSCULAR | Status: AC
  Filled 2023-05-12: qty 2

## 2023-05-12 MED ORDER — HEPARIN SODIUM (PORCINE) 1000 UNIT/ML IJ SOLN
INTRAMUSCULAR | Status: AC
  Filled 2023-05-12: qty 2

## 2023-05-12 MED ORDER — MIDODRINE HCL 5 MG PO TABS
10.0000 mg | ORAL_TABLET | Freq: Once | ORAL | Status: AC
  Administered 2023-05-12: 10 mg via ORAL
  Filled 2023-05-12 (×3): qty 2

## 2023-05-12 MED ORDER — DEXAMETHASONE SODIUM PHOSPHATE 10 MG/ML IJ SOLN
INTRAMUSCULAR | Status: AC
  Filled 2023-05-12: qty 1

## 2023-05-12 MED ORDER — FENTANYL CITRATE (PF) 250 MCG/5ML IJ SOLN
INTRAMUSCULAR | Status: DC | PRN
  Administered 2023-05-12: 100 ug via INTRAVENOUS
  Administered 2023-05-12: 50 ug via INTRAVENOUS

## 2023-05-12 MED ORDER — HEPARIN SODIUM (PORCINE) 1000 UNIT/ML IJ SOLN
1000.0000 [IU] | INTRAMUSCULAR | Status: DC | PRN
  Administered 2023-05-12: 2000 [IU]

## 2023-05-12 MED ORDER — KETAMINE HCL 50 MG/5ML IJ SOSY
PREFILLED_SYRINGE | INTRAMUSCULAR | Status: AC
  Filled 2023-05-12: qty 5

## 2023-05-12 MED ORDER — OXYCODONE HCL 5 MG PO TABS: 5.0000 mg | ORAL_TABLET | Freq: Four times a day (QID) | ORAL | 0 refills | Status: DC | PRN

## 2023-05-12 MED ORDER — ALBUMIN HUMAN 5 % IV SOLN: 12.5000 g | Freq: Once | INTRAVENOUS | Status: AC

## 2023-05-12 MED ORDER — ONDANSETRON HCL 4 MG/2ML IJ SOLN
INTRAMUSCULAR | Status: AC
  Filled 2023-05-12: qty 2

## 2023-05-12 MED ORDER — CHLORHEXIDINE GLUCONATE 4 % EX SOLN: 60.0000 mL | Freq: Once | CUTANEOUS | Status: DC

## 2023-05-12 MED ORDER — LIDOCAINE 2% (20 MG/ML) 5 ML SYRINGE
INTRAMUSCULAR | Status: AC
  Filled 2023-05-12: qty 5

## 2023-05-12 MED ORDER — PHENYLEPHRINE 80 MCG/ML (10ML) SYRINGE FOR IV PUSH (FOR BLOOD PRESSURE SUPPORT)
PREFILLED_SYRINGE | INTRAVENOUS | Status: AC
  Filled 2023-05-12: qty 10

## 2023-05-12 MED ORDER — ROCURONIUM BROMIDE 100 MG/10ML IV SOLN
INTRAVENOUS | Status: DC | PRN
  Administered 2023-05-12: 20 mg via INTRAVENOUS
  Administered 2023-05-12: 40 mg via INTRAVENOUS

## 2023-05-12 MED ORDER — ALBUMIN HUMAN 5 % IV SOLN: INTRAVENOUS | Status: DC | PRN

## 2023-05-12 MED ORDER — KETAMINE HCL 50 MG/5ML IJ SOSY
PREFILLED_SYRINGE | INTRAMUSCULAR | Status: DC | PRN
  Administered 2023-05-12: 10 mg via INTRAVENOUS
  Administered 2023-05-12: 20 mg via INTRAVENOUS

## 2023-05-12 MED ORDER — 0.9 % SODIUM CHLORIDE (POUR BTL) OPTIME
TOPICAL | Status: DC | PRN
  Administered 2023-05-12: 1000 mL

## 2023-05-12 MED ORDER — MIDAZOLAM HCL 2 MG/2ML IJ SOLN
INTRAMUSCULAR | Status: DC | PRN
  Administered 2023-05-12: 2 mg via INTRAVENOUS

## 2023-05-12 MED ORDER — ALBUMIN HUMAN 5 % IV SOLN
INTRAVENOUS | Status: AC
  Administered 2023-05-12: 12.5 g via INTRAVENOUS
  Filled 2023-05-12: qty 250

## 2023-05-12 MED ORDER — SODIUM CHLORIDE 0.9 % IV SOLN
3.0000 g | INTRAVENOUS | Status: AC
  Administered 2023-05-12: 3 g via INTRAVENOUS
  Filled 2023-05-12: qty 3

## 2023-05-12 MED ORDER — SUGAMMADEX SODIUM 200 MG/2ML IV SOLN
INTRAVENOUS | Status: DC | PRN
  Administered 2023-05-12: 200 mg via INTRAVENOUS
  Administered 2023-05-12: 50 mg via INTRAVENOUS

## 2023-05-12 MED ORDER — INSULIN ASPART 100 UNIT/ML IJ SOLN: 0.0000 [IU] | INTRAMUSCULAR | Status: DC | PRN

## 2023-05-12 MED ORDER — PROPOFOL 10 MG/ML IV BOLUS
INTRAVENOUS | Status: AC
  Filled 2023-05-12: qty 20

## 2023-05-12 MED ORDER — PHENYLEPHRINE 80 MCG/ML (10ML) SYRINGE FOR IV PUSH (FOR BLOOD PRESSURE SUPPORT)
PREFILLED_SYRINGE | INTRAVENOUS | Status: AC
  Filled 2023-05-12: qty 20

## 2023-05-12 MED ORDER — LIDOCAINE 2% (20 MG/ML) 5 ML SYRINGE
INTRAMUSCULAR | Status: DC | PRN
  Administered 2023-05-12: 20 mg via INTRAVENOUS

## 2023-05-12 MED ORDER — GLYCOPYRROLATE PF 0.2 MG/ML IJ SOSY
PREFILLED_SYRINGE | INTRAMUSCULAR | Status: DC | PRN
  Administered 2023-05-12: .1 mg via INTRAVENOUS

## 2023-05-12 MED ORDER — SODIUM CHLORIDE 0.9 % IV SOLN: INTRAVENOUS | Status: DC

## 2023-05-12 MED ORDER — SODIUM CHLORIDE (PF) 0.9 % IJ SOLN
INTRAMUSCULAR | Status: AC
  Filled 2023-05-12: qty 10

## 2023-05-12 MED ORDER — FENTANYL CITRATE (PF) 250 MCG/5ML IJ SOLN
INTRAMUSCULAR | Status: AC
  Filled 2023-05-12: qty 5

## 2023-05-12 MED ORDER — SODIUM CHLORIDE 0.9% FLUSH: 10.0000 mL | Freq: Two times a day (BID) | INTRAVENOUS | Status: DC

## 2023-05-12 MED ORDER — PHENYLEPHRINE HCL-NACL 20-0.9 MG/250ML-% IV SOLN
INTRAVENOUS | Status: DC | PRN
  Administered 2023-05-12: 40 ug/min via INTRAVENOUS

## 2023-05-12 MED ORDER — HEPARIN 6000 UNIT IRRIGATION SOLUTION
Status: AC
  Filled 2023-05-12: qty 500

## 2023-05-12 MED ORDER — VASOPRESSIN 20 UNIT/ML IV SOLN
INTRAVENOUS | Status: DC | PRN
  Administered 2023-05-12 (×2): 2 [IU] via INTRAVENOUS
  Administered 2023-05-12: 1 [IU] via INTRAVENOUS
  Administered 2023-05-12: 1.5 [IU] via INTRAVENOUS
  Administered 2023-05-12: .5 [IU] via INTRAVENOUS
  Administered 2023-05-12 (×2): 1 [IU] via INTRAVENOUS
  Administered 2023-05-12: 2 [IU] via INTRAVENOUS

## 2023-05-12 MED ORDER — SUCCINYLCHOLINE CHLORIDE 200 MG/10ML IV SOSY
PREFILLED_SYRINGE | INTRAVENOUS | Status: DC | PRN
  Administered 2023-05-12: 120 mg via INTRAVENOUS

## 2023-05-12 MED ORDER — HEPARIN 6000 UNIT IRRIGATION SOLUTION
Status: DC | PRN
  Administered 2023-05-12: 1

## 2023-05-12 MED ORDER — CHLORHEXIDINE GLUCONATE 0.12 % MT SOLN
15.0000 mL | Freq: Once | OROMUCOSAL | Status: AC
  Administered 2023-05-12: 15 mL via OROMUCOSAL
  Filled 2023-05-12: qty 15

## 2023-05-12 MED ORDER — DEXAMETHASONE SODIUM PHOSPHATE 10 MG/ML IJ SOLN
INTRAMUSCULAR | Status: DC | PRN
  Administered 2023-05-12: 5 mg via INTRAVENOUS

## 2023-05-12 MED ORDER — FENTANYL CITRATE (PF) 100 MCG/2ML IJ SOLN: 25.0000 ug | INTRAMUSCULAR | Status: DC | PRN

## 2023-05-12 MED ORDER — ACETAMINOPHEN 500 MG PO TABS
1000.0000 mg | ORAL_TABLET | Freq: Once | ORAL | Status: AC
  Administered 2023-05-12: 1000 mg via ORAL
  Filled 2023-05-12: qty 2

## 2023-05-12 MED ORDER — PHENYLEPHRINE 80 MCG/ML (10ML) SYRINGE FOR IV PUSH (FOR BLOOD PRESSURE SUPPORT)
PREFILLED_SYRINGE | INTRAVENOUS | Status: DC | PRN
  Administered 2023-05-12: 240 ug via INTRAVENOUS
  Administered 2023-05-12: 160 ug via INTRAVENOUS
  Administered 2023-05-12: 240 ug via INTRAVENOUS
  Administered 2023-05-12: 320 ug via INTRAVENOUS
  Administered 2023-05-12: 240 ug via INTRAVENOUS
  Administered 2023-05-12 (×2): 160 ug via INTRAVENOUS
  Administered 2023-05-12: 240 ug via INTRAVENOUS

## 2023-05-12 MED ORDER — PROPOFOL 10 MG/ML IV BOLUS
INTRAVENOUS | Status: DC | PRN
  Administered 2023-05-12: 200 mg via INTRAVENOUS

## 2023-05-12 MED ORDER — VASOPRESSIN 20 UNIT/ML IV SOLN
INTRAVENOUS | Status: AC
  Filled 2023-05-12: qty 1

## 2023-05-12 MED ORDER — SODIUM CHLORIDE 0.9 % IV SOLN: 12.5000 mg | INTRAVENOUS | Status: DC | PRN

## 2023-05-12 MED ORDER — OXYCODONE HCL 5 MG PO TABS
5.0000 mg | ORAL_TABLET | Freq: Once | ORAL | Status: AC
  Administered 2023-05-12: 5 mg via ORAL

## 2023-05-12 MED ORDER — ONDANSETRON HCL 4 MG/2ML IJ SOLN
INTRAMUSCULAR | Status: DC | PRN
  Administered 2023-05-12: 4 mg via INTRAVENOUS

## 2023-05-12 MED ORDER — ROCURONIUM BROMIDE 10 MG/ML (PF) SYRINGE
PREFILLED_SYRINGE | INTRAVENOUS | Status: AC
  Filled 2023-05-12: qty 10

## 2023-05-12 MED ORDER — ORAL CARE MOUTH RINSE: 15.0000 mL | Freq: Once | OROMUCOSAL | Status: AC

## 2023-05-12 MED ORDER — GLYCOPYRROLATE PF 0.2 MG/ML IJ SOSY
PREFILLED_SYRINGE | INTRAMUSCULAR | Status: AC
  Filled 2023-05-12: qty 1

## 2023-05-12 MED ORDER — SODIUM CHLORIDE 0.9% FLUSH: 10.0000 mL | INTRAVENOUS | Status: DC | PRN

## 2023-05-12 MED ORDER — OXYCODONE HCL 5 MG PO TABS
ORAL_TABLET | ORAL | Status: AC
  Filled 2023-05-12: qty 1

## 2023-05-12 MED ORDER — ALBUMIN HUMAN 5 % IV SOLN: 12.5000 g | Freq: Once | INTRAVENOUS | Status: DC

## 2023-05-12 SURGICAL SUPPLY — 27 items
ARMBAND PINK RESTRICT EXTREMIT (MISCELLANEOUS) ×1 IMPLANT
BAG COUNTER SPONGE SURGICOUNT (BAG) ×1 IMPLANT
CANISTER SUCT 3000ML PPV (MISCELLANEOUS) ×1 IMPLANT
CLIP LIGATING EXTRA MED SLVR (CLIP) ×1 IMPLANT
CLIP LIGATING EXTRA SM BLUE (MISCELLANEOUS) ×1 IMPLANT
COVER PROBE W GEL 5X96 (DRAPES) IMPLANT
DERMABOND ADVANCED .7 DNX12 (GAUZE/BANDAGES/DRESSINGS) ×1 IMPLANT
ELECT REM PT RETURN 9FT ADLT (ELECTROSURGICAL) ×1
ELECTRODE REM PT RTRN 9FT ADLT (ELECTROSURGICAL) ×1 IMPLANT
GLOVE BIO SURGEON STRL SZ7.5 (GLOVE) ×1 IMPLANT
GOWN STRL REUS W/ TWL LRG LVL3 (GOWN DISPOSABLE) ×2 IMPLANT
GOWN STRL REUS W/ TWL XL LVL3 (GOWN DISPOSABLE) ×1 IMPLANT
INSERT FOGARTY SM (MISCELLANEOUS) IMPLANT
KIT BASIN OR (CUSTOM PROCEDURE TRAY) ×1 IMPLANT
KIT TURNOVER KIT B (KITS) ×1 IMPLANT
NS IRRIG 1000ML POUR BTL (IV SOLUTION) ×1 IMPLANT
PACK CV ACCESS (CUSTOM PROCEDURE TRAY) ×1 IMPLANT
PAD ARMBOARD 7.5X6 YLW CONV (MISCELLANEOUS) ×2 IMPLANT
POWDER SURGICEL 3.0 GRAM (HEMOSTASIS) IMPLANT
SLING ARM FOAM STRAP LRG (SOFTGOODS) IMPLANT
SLING ARM FOAM STRAP MED (SOFTGOODS) IMPLANT
SUT MNCRL AB 4-0 PS2 18 (SUTURE) ×1 IMPLANT
SUT PROLENE 6 0 BV (SUTURE) ×1 IMPLANT
SUT VIC AB 3-0 SH 27X BRD (SUTURE) ×1 IMPLANT
TOWEL GREEN STERILE (TOWEL DISPOSABLE) ×1 IMPLANT
UNDERPAD 30X36 HEAVY ABSORB (UNDERPADS AND DIAPERS) ×1 IMPLANT
WATER STERILE IRR 1000ML POUR (IV SOLUTION) ×1 IMPLANT

## 2023-05-12 NOTE — Anesthesia Procedure Notes (Signed)
Procedure Name: LMA Insertion Date/Time: 05/12/2023 9:48 AM  Performed by: Ammie Dalton, CRNAPre-anesthesia Checklist: Patient identified, Emergency Drugs available, Suction available and Patient being monitored Patient Re-evaluated:Patient Re-evaluated prior to induction Oxygen Delivery Method: Circle System Utilized Preoxygenation: Pre-oxygenation with 100% oxygen Induction Type: IV induction Ventilation: Mask ventilation without difficulty LMA: LMA inserted LMA Size: 5.0 Number of attempts: 1 Airway Equipment and Method: Bite block Placement Confirmation: positive ETCO2 Tube secured with: Tape Dental Injury: Teeth and Oropharynx as per pre-operative assessment

## 2023-05-12 NOTE — Anesthesia Procedure Notes (Signed)
Procedure Name: Intubation Date/Time: 05/12/2023 10:01 AM  Performed by: Ammie Dalton, CRNAPre-anesthesia Checklist: Patient identified, Emergency Drugs available, Suction available and Patient being monitored Patient Re-evaluated:Patient Re-evaluated prior to induction Oxygen Delivery Method: Circle System Utilized Preoxygenation: Pre-oxygenation with 100% oxygen Induction Type: Combination inhalational/ intravenous induction Ventilation: Mask ventilation without difficulty Laryngoscope Size: Glidescope and 4 (lopro) Grade View: Grade I Tube type: Oral Tube size: 7.0 (what was readily available (not d/t patient circumstance) mm Number of attempts: 1 Airway Equipment and Method: Oral airway and Rigid stylet Placement Confirmation: ETT inserted through vocal cords under direct vision, positive ETCO2 and breath sounds checked- equal and bilateral Secured at: 23 cm Tube secured with: Tape Dental Injury: Teeth and Oropharynx as per pre-operative assessment

## 2023-05-12 NOTE — Anesthesia Postprocedure Evaluation (Signed)
Anesthesia Post Note  Patient: Khiem Hunn  Procedure(s) Performed: RIGHT UPPER EXTREMITY ARTERIOVENOUS (AV) FISTULA CREATION (Right: Arm Upper)     Patient location during evaluation: PACU Anesthesia Type: General Level of consciousness: awake and alert Pain management: pain level controlled Vital Signs Assessment: post-procedure vital signs reviewed and stable Respiratory status: spontaneous breathing, nonlabored ventilation, respiratory function stable and patient connected to nasal cannula oxygen Cardiovascular status: blood pressure returned to baseline and stable Postop Assessment: no apparent nausea or vomiting Anesthetic complications: no   No notable events documented.  Last Vitals:  Vitals:   05/12/23 1345 05/12/23 1400  BP: 119/60 (!) 128/58  Pulse: 90 79  Resp: 19 13  Temp:  36.7 C  SpO2: 99% 98%    Last Pain:  Vitals:   05/12/23 1330  TempSrc:   PainSc: 7                  Kennieth Rad

## 2023-05-12 NOTE — Transfer of Care (Signed)
Immediate Anesthesia Transfer of Care Note  Patient: Paul [name]  Procedure(s) Performed: RIGHT UPPER EXTREMITY ARTERIOVENOUS (AV) FISTULA CREATION (Right: Arm Upper)  Patient Location: PACU  Anesthesia Type:General  Level of Consciousness: awake, alert , oriented, and patient cooperative  Airway & Oxygen Therapy: Patient Spontanous Breathing and Patient connected to face mask oxygen  Post-op Assessment: Report given to RN and Post -op Vital signs reviewed and stable  Post vital signs: Reviewed and stable  Last Vitals:  Vitals Value Taken Time  BP 100/50 05/12/23 1145  Temp    Pulse 97 05/12/23 1146  Resp 18 05/12/23 1146  SpO2 100 % 05/12/23 1146  Vitals shown include unfiled device data.  Last Pain:  Vitals:   05/12/23 0720  TempSrc:   PainSc: 0-No pain         Complications: No notable events documented.

## 2023-05-12 NOTE — Discharge Instructions (Signed)

## 2023-05-12 NOTE — H&P (Signed)
HPI:   Paul [name] is a 32 y.o. male history of incision and multiple previous upper extremity surgeries most recently brachial artery thrombectomy after embolectomy during procedure to open a clotted graft.  He states he does have some numbness of the left hand with fully motor intact.  Patient is right-hand dominant but does have previous surgeries in the right as well.  He is here today to discuss further permanent access as he is currently dialyzing Tuesdays, Thursdays and Saturdays at tunneled dialysis catheter.  He does not have any complaints related to today's visit.       Past Medical History:  Diagnosis Date   Acute hypoxemic respiratory failure (HCC) 09/11/2016   Anemia     Chronic kidney disease      ARF on CRF Dialysis T/TH/Sa   Diabetes mellitus      Type II   End stage renal disease on dialysis (HCC)      [place]    GERD (gastroesophageal reflux disease)      diet controlled   HLD (hyperlipidemia)     Hypertension     Hypothyroidism     Morbid obesity (HCC)     Sacral wound      resolved   Thyroid disease               Family History  Problem Relation Age of Onset   Heart disease Mother     Hypertension Mother               Past Surgical History:  Procedure Laterality Date   AMPUTATION Left 05/08/2021    Procedure: AMPUTATION GREAT TOE;  Surgeon: Nadara Mustard, MD;  Location: [facility] OR;  Service: Orthopedics;  Laterality: Left;   AMPUTATION Left 10/01/2022    Procedure: LEFT FOOT AMPUTATION RAY 1st;  Surgeon: Nadara Mustard, MD;  Location: [facility] OR;  Service: Orthopedics;  Laterality: Left;   AV FISTULA PLACEMENT Left 09/26/2016    Procedure: LEFT UPPER ARM ARTERIOVENOUS (AV) FISTULA CREATION;  Surgeon: Sherren Kerns, MD;  Location: [facility] OR;  Service: Vascular;  Laterality: Left;   AV FISTULA PLACEMENT Left 12/02/2016    Procedure: INSERTION OF ARTERIOVENOUS GORE-TEX GRAFT LEFT UPPER  ARM USING A 4-7MM BY 45CM GRAFT ;  Surgeon: Larina Earthly, MD;   Location: [facility] OR;  Service: Vascular;  Laterality: Left;   AV FISTULA PLACEMENT Right 07/03/2020    Procedure: INSERTION OF ARTERIOVENOUS (AV) GORE-TEX GRAFT RIGHT UPPER ARM;  Surgeon: Cephus Shelling, MD;  Location: [facility] OR;  Service: Vascular;  Laterality: Right;   AV FISTULA PLACEMENT Left 07/02/2021    Procedure: LEFT UPPER EXTREMITY ARTERIOVENOUS FISTULA CREATION;  Surgeon: Maeola Harman, MD;  Location: [facility] OR;  Service: Vascular;  Laterality: Left;   BASCILIC VEIN TRANSPOSITION Right 05/06/2020    Procedure: FIRST STAGE RIGHT BASCILIC VEIN TRANSPOSITION;  Surgeon: Nada Libman, MD;  Location: [facility] OR;  Service: Vascular;  Laterality: Right;   BASCILIC VEIN TRANSPOSITION Left 08/20/2021    Procedure: LEFT ARM SECOND STAGE WITH ARTERIOVENOUS GRAFT;  Surgeon: Maeola Harman, MD;  Location: [facility] OR;  Service: Vascular;  Laterality: Left;   EXCHANGE OF A DIALYSIS CATHETER Right 10/08/2016    Procedure: EXCHANGE OF A DIALYSIS CATHETER-RIGHT INTERNAL JUGULAR;  Surgeon: Fransisco Hertz, MD;  Location: [facility] OR;  Service: Vascular;  Laterality: Right;   INSERTION OF DIALYSIS CATHETER Right 09/26/2016    Procedure: INSERTION OF DIALYSIS CATHETER - Right Internal Jugular  Placement;  Surgeon: Sherren Kerns, MD;  Location: [facility] OR;  Service: Vascular;  Laterality: Right;   INSERTION OF DIALYSIS CATHETER Left 03/22/2018    Procedure: INSERTION OF DIALYSIS CATHETER;  Surgeon: Cephus Shelling, MD;  Location: [facility] OR;  Service: Vascular;  Laterality: Left;   INTRAOPERATIVE ARTERIOGRAM Left 05/04/2022    Procedure: INTRA OPERATIVE ARTERIOGRAM;  Surgeon: Victorino Sparrow, MD;  Location: [facility] OR;  Service: Vascular;  Laterality: Left;   IR AV DIALY SHUNT INTRO NEEDLE/INTRAC INITIAL W/PTA/STENT/IMG LT Left 02/17/2023   IR AV DIALY SHUNT INTRO NEEDLE/INTRACATH INITIAL W/PTA/IMG LEFT   08/19/2022   IR AV DIALY SHUNT INTRO NEEDLE/INTRACATH INITIAL W/PTA/IMG LEFT   11/14/2022   IR DIALY SHUNT INTRO  NEEDLE/INTRACATH INITIAL W/IMG LEFT Left 04/15/2022   IR FLUORO GUIDE CV LINE LEFT   04/19/2022   IR FLUORO GUIDE CV LINE LEFT   05/30/2022   IR FLUORO GUIDE CV LINE LEFT   03/24/2023   IR FLUORO GUIDE CV LINE RIGHT   03/26/2021   IR FLUORO GUIDE CV LINE RIGHT   05/13/2021   IR FLUORO GUIDE CV LINE RIGHT   05/04/2022   IR FLUORO GUIDE CV LINE RIGHT   02/17/2023   IR REMOVAL TUN CV CATH W/O FL   05/11/2021   IR REMOVAL TUN CV CATH W/O FL   10/06/2021   IR THROMBECTOMY AV FISTULA W/THROMBOLYSIS/PTA INC/SHUNT/IMG LEFT Left 02/02/2022   IR THROMBECTOMY AV FISTULA W/THROMBOLYSIS/PTA INC/SHUNT/IMG LEFT Left 03/22/2022   IR THROMBECTOMY AV FISTULA W/THROMBOLYSIS/PTA INC/SHUNT/IMG LEFT Left 05/04/2022   IR THROMBECTOMY AV FISTULA W/THROMBOLYSIS/PTA INC/SHUNT/IMG LEFT Left 12/09/2022   IR THROMBECTOMY AV FISTULA W/THROMBOLYSIS/PTA/STENT INC/SHUNT/IMG LT Left 04/27/2022   IR US GUIDE VASC ACCESS LEFT   02/02/2022   IR US GUIDE VASC ACCESS LEFT   03/22/2022   IR US GUIDE VASC ACCESS LEFT   04/15/2022   IR US GUIDE VASC ACCESS LEFT   04/19/2022   IR US GUIDE VASC ACCESS LEFT   04/27/2022   IR US GUIDE VASC ACCESS LEFT   12/13/2022   IR US GUIDE VASC ACCESS LEFT   02/21/2023   IR US GUIDE VASC ACCESS RIGHT   03/26/2021   IR US GUIDE VASC ACCESS RIGHT   05/13/2021   IR US GUIDE VASC ACCESS RIGHT   05/04/2022   IR US GUIDE VASC ACCESS RIGHT   02/17/2023   REVISON OF ARTERIOVENOUS FISTULA Left 03/22/2018    Procedure: LEFT UPPER EXTREMITY ARTERIOVENOUS  REVISION WITH GORE-TEX GRAFT.;  Surgeon: Cephus Shelling, MD;  Location: [facility] OR;  Service: Vascular;  Laterality: Left;   TEE WITHOUT CARDIOVERSION N/A 05/12/2021    Procedure: TRANSESOPHAGEAL ECHOCARDIOGRAM (TEE);  Surgeon: Christell Constant, MD;  Location: [facility] ENDOSCOPY;  Service: Cardiovascular;  Laterality: N/A;   THROMBECTOMY Left 08/26/2019    thrombectomy of LUA loop AVG   THROMBECTOMY AND REVISION OF ARTERIOVENTOUS (AV) GORETEX  GRAFT Right 07/27/2020     Procedure: INSERTION OF RIGHT ARM LOOP GRAFT WITH EXCISION OF RIGHT ARM BRACHIAL AXILLARY GRAFT;  Surgeon: Cephus Shelling, MD;  Location: [facility] OR;  Service: Vascular;  Laterality: Right;   THROMBECTOMY BRACHIAL ARTERY Left 05/04/2022    Procedure: LEFT ARM RADIAL BRACHIAL ARTERY EMBOLECTOMY;  Surgeon: Victorino Sparrow, MD;  Location: [facility] OR;  Service: Vascular;  Laterality: Left;   UPPER EXTREMITY VENOGRAPHY Bilateral 06/14/2021    Procedure: UPPER EXTREMITY VENOGRAPHY;  Surgeon: Maeola Harman, MD;  Location: [facility] INVASIVE CV LAB;  Service: Cardiovascular;  Laterality: Bilateral;   VISCERAL ANGIOGRAPHY Right 06/17/2020    Procedure: CENTRAL VENO;  Surgeon: Leonie Douglas, MD;  Location: [facility] INVASIVE CV LAB;  Service: Cardiovascular;  Laterality: Right;          Short Social History:  Social History        Tobacco Use   Smoking status: Never   Smokeless tobacco: Never  Substance Use Topics   Alcohol use: No      Allergies       Allergies  Allergen Reactions   Hydralazine Hcl Other (See Comments)      DRUG-INDUCED LUPUS              Current Outpatient Medications  Medication Sig Dispense Refill   ACCU-CHEK GUIDE test strip USE AS DIRECTED TWICE DAILY 100 each 0   Accu-Chek Softclix Lancets lancets Use as instructed 100 each 12   apixaban (ELIQUIS) 5 MG TABS tablet Take 1 tablet by mouth twice daily 60 tablet 5   ascorbic acid (VITAMIN C) 1000 MG tablet Take 1 tablet (1,000 mg total) by mouth daily. 30 tablet 0   aspirin EC 81 MG tablet Take 1 tablet (81 mg total) by mouth daily. Swallow whole. 90 tablet 0   Blood Glucose Monitoring Suppl (ACCU-CHEK GUIDE ME) w/Device KIT 1 kit by Does not apply route daily. 1 kit 0   calcium carbonate (TUMS - DOSED IN MG ELEMENTAL CALCIUM) 500 MG chewable tablet Chew 1,000 mg by mouth daily as needed for indigestion or heartburn.       cetirizine (ZYRTEC) 10 MG tablet Take 1 tablet (10 mg total) by mouth daily as needed for  allergies. 30 tablet 0   cinacalcet (SENSIPAR) 30 MG tablet Take 30 mg by mouth daily.       ciprofloxacin (CIPRO) 500 MG tablet Take 1 tablet (500 mg total) by mouth 2 (two) times daily. 20 tablet 0   ferric citrate (AURYXIA) 1 GM 210 MG(Fe) tablet Take 2-4 tablets (420-840 mg total) by mouth See admin instructions. Take 4 tablets (840 mg) by mouth with each meal & take 2 tablets (420 mg) by mouth with each snack. 270 tablet 0   gabapentin (NEURONTIN) 100 MG capsule TAKE 1 CAPSULE BY MOUTH THREE TIMES DAILY 90 capsule 0   gabapentin (NEURONTIN) 300 MG capsule Take 1 capsule by mouth at bedtime 30 capsule 0   LANTUS 100 UNIT/ML injection INJECT 5 UNITS SUBCUTANEOUSLY TWICE DAILY 10 mL 0   levothyroxine (SYNTHROID) 150 MCG tablet TAKE 1 TABLET BY MOUTH ONCE DAILY BEFORE BREAKFAST *APPOINTMENT NEEDED FOR FURTHER REFILLS* (Patient taking differently: Take 150 mcg by mouth daily before breakfast.) 15 tablet 0   midodrine (PROAMATINE) 5 MG tablet Take 1 tablet (5 mg total) by mouth Every Tuesday,Thursday,and Saturday with dialysis. 30 tablet 3   multivitamin (RENA-VIT) TABS tablet Take 1 tablet by mouth at bedtime. 30 tablet 0   oxyCODONE (OXY IR/ROXICODONE) 5 MG immediate release tablet Take 1 tablet (5 mg total) by mouth every 12 (twelve) hours as needed for severe pain (pain score 7-10) (pain score 4-6). 14 tablet 0   polyethylene glycol (MIRALAX / GLYCOLAX) 17 g packet Take 17 g by mouth daily as needed for mild constipation. 14 each 0   rosuvastatin (CRESTOR) 20 MG tablet Take 1 tablet by mouth once daily 90 tablet 0   Syringe, Disposable, (B-D SYRINGE SLIP TIP 30CC) 30 ML MISC 1 Syringe by Does not apply route daily. 30 each 11   zinc  sulfate 220 (50 Zn) MG capsule Take 1 capsule (220 mg total) by mouth daily. 14 capsule 0      No current facility-administered medications for this visit.        Review of Systems  Constitutional: Positive for unexpected weight change.  HENT: HENT negative.   Eyes: Eyes negative.  Respiratory: Respiratory negative.  Cardiovascular: Cardiovascular negative.  GI: Gastrointestinal negative.  Musculoskeletal: Musculoskeletal negative.  Skin: Skin negative.  Neurological: Neurological negative. Hematologic: Hematologic/lymphatic negative.  Psychiatric: Psychiatric negative.          Objective:    Vitals:   05/12/23 0638  BP: 117/72  Pulse: (!) 102  Resp: 18  Temp: 98.1 F (36.7 C)  SpO2: 97%    Physical Exam HENT:     Head: Normocephalic.     Nose: Nose normal.  Eyes:     Pupils: Pupils are equal, round, and reactive to light.  Cardiovascular:     Rate and Rhythm: Normal rate.     Pulses:          Radial pulses are 2+ on the right side and 2+ on the left side.  Pulmonary:     Effort: Pulmonary effort is normal.  Abdominal:     General: Abdomen is flat.  Musculoskeletal:        General: Normal range of motion.     Right lower leg: No edema.     Left lower leg: No edema.  Skin:    General: Skin is warm.     Capillary Refill: Capillary refill takes less than 2 seconds.  Neurological:     General: No focal deficit present.     Mental Status: He is alert.  Psychiatric:        Mood and Affect: Mood normal.        Thought Content: Thought content normal.        Data: Right Pre-Dialysis Findings:  +-----------------------+----------+--------------------+---------+--------  +  Location              PSV (cm/s)Intralum. Diam. (cm)Waveform  Comments  +-----------------------+----------+--------------------+---------+--------  +  Brachial Antecub. [id number]        0.37                triphasic           +-----------------------+----------+--------------------+---------+--------  +  Radial Art at Wrist    65        0.23                triphasic           +-----------------------+----------+--------------------+---------+--------  +  Ulnar Art at Wrist     45        0.22                triphasic            +-----------------------+----------+--------------------+---------+--------  +    Left Pre-Dialysis Findings:  +-----------------------+----------+--------------------+---------+--------  +  Location              PSV (cm/s)Intralum. Diam. (cm)Waveform  Comments  +-----------------------+----------+--------------------+---------+--------  +  Brachial Antecub. [id number]        0.40                triphasic           +-----------------------+----------+--------------------+---------+--------  +  Radial Art at Wrist    36        0.16  triphasic           +-----------------------+----------+--------------------+---------+--------  +  Ulnar Art at Wrist     45        0.20                triphasic           +-----------------------+----------+--------------------+---------+--------  +  Summary:    Right: No obstruction visualized in the right upper extremity.  Left: No obstruction visualized in the left upper extremity.        Right Cephalic   Diameter (cm)Depth (cm)Findings   +-----------------+-------------+----------+---------+  Shoulder            0.40                          +-----------------+-------------+----------+---------+  Prox upper arm       0.43                          +-----------------+-------------+----------+---------+  Mid upper arm        0.46               branching  +-----------------+-------------+----------+---------+  Dist upper arm       0.37                          +-----------------+-------------+----------+---------+  Antecubital fossa    0.39               branching  +-----------------+-------------+----------+---------+  Prox forearm         0.32               branching  +-----------------+-------------+----------+---------+  Mid forearm          0.37                          +-----------------+-------------+----------+---------+  Dist forearm         0.32                           +-----------------+-------------+----------+---------+  Wrist               0.38                          +-----------------+-------------+----------+---------+   +-----------------+-------------+----------+--------------+  Right Basilic    Diameter (cm)Depth (cm)   Findings     +-----------------+-------------+----------+--------------+  Shoulder                               not visualized  +-----------------+-------------+----------+--------------+  Prox upper arm                          not visualized  +-----------------+-------------+----------+--------------+  Mid upper arm                           not visualized  +-----------------+-------------+----------+--------------+  Dist upper arm                          not visualized  +-----------------+-------------+----------+--------------+  Antecubital fossa  not visualized  +-----------------+-------------+----------+--------------+  Prox forearm                            not visualized  +-----------------+-------------+----------+--------------+  Mid forearm                             not visualized  +-----------------+-------------+----------+--------------+  Distal forearm                          not visualized  +-----------------+-------------+----------+--------------+  Wrist                                  not visualized  +-----------------+-------------+----------+--------------+   +-----------------+-------------+----------+--------------+  Left Cephalic    Diameter (cm)Depth (cm)   Findings     +-----------------+-------------+----------+--------------+  Shoulder            0.35                               +-----------------+-------------+----------+--------------+  Prox upper arm       0.46                               +-----------------+-------------+----------+--------------+  Mid upper arm         0.50                               +-----------------+-------------+----------+--------------+  Dist upper arm       0.41                 branching     +-----------------+-------------+----------+--------------+  Antecubital fossa    0.21                               +-----------------+-------------+----------+--------------+  Prox forearm                            not visualized  +-----------------+-------------+----------+--------------+  Mid forearm                             not visualized  +-----------------+-------------+----------+--------------+  Dist forearm                            not visualized  +-----------------+-------------+----------+--------------+  Wrist                                  not visualized  +-----------------+-------------+----------+--------------+   +-----------------+-------------+----------+--------------+  Left Basilic     Diameter (cm)Depth (cm)   Findings     +-----------------+-------------+----------+--------------+  Shoulder                               not visualized  +-----------------+-------------+----------+--------------+  Prox upper arm                          not visualized  +-----------------+-------------+----------+--------------+  Mid  upper arm                           not visualized  +-----------------+-------------+----------+--------------+  Dist upper arm                          not visualized  +-----------------+-------------+----------+--------------+  Antecubital fossa                       not visualized  +-----------------+-------------+----------+--------------+  Prox forearm                            not visualized  +-----------------+-------------+----------+--------------+  Mid forearm                             not visualized  +-----------------+-------------+----------+--------------+  Distal forearm                           not visualized  +-----------------+-------------+----------+--------------+  Wrist                                  not visualized  +-----------------+-------------+----------+--------------+          Assessment/Plan:   32 year old male with end-stage renal disease as noted above multiple previous bilateral upper extremity procedures.  Possibly has a right arm cephalic vein fistula and has a stent in one of his paired axillary veins. Plan for right arm avf vs avg today in OR. Discussed risks, benefits and alternatives.    Joniel Graumann C. Randie Heinz, MD [facility] [place] Office: [phone] Pager: [phone]

## 2023-05-12 NOTE — Op Note (Signed)
    Patient name: Nasr Dooms MRN: [id number] DOB: 25-Feb-1991 Sex: male  05/12/2023 Pre-operative Diagnosis: ESRD Post-operative diagnosis:  Same Surgeon:  Apolinar Junes C. Randie Heinz, MD Assistant: Aggie Moats, PA Procedure Performed:  Right arm basilic vein av fistula creation with endarterectomy of the brachial artery  Indications: 32 year old male with history of end-stage currently dialyzing catheter.  He has multiple previous upper extremity access procedures and has undergone right upper extremity venography and plan for right arm fistula versus graft.  In experience assistant was necessary to facilitate exposure of the brachial basilic vein as well as the brachial artery and performed endarterectomy of the graft brachial artery and new anastomosis.  Findings: The basilic vein was very large and 4 mm dilator was easily passed.  The brachial artery had minimal pulsatility and there was an old disconnected graft attached to the artery there and needed likely to perform anastomosis where the previous graft was then performed limited endarterectomy at that site.   Procedure:  The patient was identified in the holding area and taken to the operating room where he was placed supine operative table and LMA anesthesia was induced.  He was sterilely prepped and draped in the right upper extremity in usual fashion, antibiotics were administered and timeout was called.  Ultrasound was used to identify the superficial brachial artery formed on the antecubitum and incision was created between the 2.  We dissected down very deep to the basilic vein and dissected this free for several centimeters dividing branches between ties.  We used ultrasound to identify the brachial artery and then dissected down where there was a disconnected graft and expose the brachial artery proximally and distally to the graft.  We then transected the vein distally past dilators up to 4 mm.  Easily flushed with heparinized saline and  clamped it and the distal vein was tied off.  Artery was then clamped distally and proximally to the previous graft.  We opened back on the graft where there was significant buildup of thrombus and calcium and we perform endarterectomy and there was very strong inflow and flushed.  Artery with heparinized saline both directions.  The vein was then sewn into side with 6-0 prolene suture.  Prior to completion of flushing all directions.  Upon completion there was a very strong thrill in the fistula confirmed with Doppler and Doppler confirmed flow in the radial artery at the wrist.  We then irrigated the wound and obtain hemostasis and closed in layers with Vicryl and Monocryl.  Dermabond was placed at the skin level.  Patient was awakened from anesthesia having tolerated the procedure well without immediate complication.  All counts were correct at completion.   EBL: 100cc   Mills Mitton C. Randie Heinz, MD [facility] [place] Office: [phone] Pager: [phone]

## 2023-05-13 ENCOUNTER — Encounter (HOSPITAL_COMMUNITY): Payer: Self-pay | Admitting: Vascular Surgery

## 2023-05-15 ENCOUNTER — Other Ambulatory Visit: Payer: Self-pay | Admitting: Student

## 2023-05-23 DIAGNOSIS — E1129 Type 2 diabetes mellitus with other diabetic kidney complication: Secondary | ICD-10-CM | POA: Diagnosis not present

## 2023-05-23 DIAGNOSIS — Z992 Dependence on renal dialysis: Secondary | ICD-10-CM | POA: Diagnosis not present

## 2023-05-23 DIAGNOSIS — N186 End stage renal disease: Secondary | ICD-10-CM | POA: Diagnosis not present

## 2023-05-31 ENCOUNTER — Ambulatory Visit: Payer: Medicaid Other | Admitting: Family

## 2023-05-31 ENCOUNTER — Encounter: Payer: Self-pay | Admitting: Family

## 2023-05-31 DIAGNOSIS — L089 Local infection of the skin and subcutaneous tissue, unspecified: Secondary | ICD-10-CM

## 2023-05-31 DIAGNOSIS — Z89432 Acquired absence of left foot: Secondary | ICD-10-CM | POA: Diagnosis not present

## 2023-05-31 DIAGNOSIS — E11628 Type 2 diabetes mellitus with other skin complications: Secondary | ICD-10-CM | POA: Diagnosis not present

## 2023-05-31 NOTE — Progress Notes (Signed)
 Post-Op Visit Note   Patient: Paul [name]           Date of Birth: 02-Jan-1991           MRN: [id number] Visit Date: 05/31/2023 PCP: Dartha Geralds, DO  Chief Complaint:  Chief Complaint  Patient presents with   Right Foot - Wound Check    HPI:  HPI The patient is a 33 year old gentleman seen status post left first ray amputation this has been slow to heal.  He has buildup of hypertrophic callus today. Ortho Exam Wound is stable.  On examination of the left foot the first ray amputation continues to gradually improve.  This was debrided with a 10 blade knife of hyperkeratotic tissue back to viable tissue there is one remaining open area proximally this is 1 cm in diameter with 5 mm of depth. there is no necrotic tissue does not probe to bone no sign of infection  Visit Diagnoses:  No diagnosis found.   Plan: Continue daily Dial  soap cleansing.  May continue dry dressing changes. will follow-up in 2 weeks.  Follow-Up Instructions: No follow-ups on file.   Imaging: No results found.  Orders:  No orders of the defined types were placed in this encounter.  No orders of the defined types were placed in this encounter.    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   Thrombus 05/04/2022   Ischemia of left upper extremity 05/04/2022   Complication of vascular access for dialysis 04/19/2022   ESRD (end stage renal disease) (HCC) 04/19/2022   Failure of hemodialysis access (HCC) 04/18/2022   Left hand pain 09/22/2021   Diabetic foot infection (HCC)    Wound infection 05/07/2021   MSSA bacteremia 05/07/2021   Osteomyelitis (HCC) 05/07/2021   Abscess 05/07/2021   Sepsis due to cellulitis (HCC) 05/06/2021   Poor compliance with medication 03/17/2021   Fluid overload, unspecified 07/09/2020   Complication of vascular  dialysis catheter 03/23/2020   Other disorders of phosphorus metabolism 01/28/2020   Hypertension 01/08/2020   Hypocalcemia 10/29/2019   ESRD on dialysis (HCC) 11/16/2017   Pruritus, unspecified 04/04/2017   Unspecified protein-calorie malnutrition (HCC) 11/11/2016   Headache, unspecified 10/29/2016   Encounter for immunization 10/19/2016   Iron  deficiency anemia, unspecified 10/10/2016   Anemia in chronic kidney disease 09/30/2016   Coagulation defect, unspecified (HCC) 09/30/2016   Pneumonia due to Pseudomonas (HCC) 09/30/2016   Secondary hyperparathyroidism of renal origin (HCC) 09/30/2016   Hypothyroidism 10/29/2009   Type 2 diabetes mellitus (HCC) 04/14/2009   Hyperlipidemia 04/14/2009   OBESITY, MORBID 04/14/2009   Essential hypertension, benign 04/14/2009   Past Medical History:  Diagnosis Date   Acute hypoxemic respiratory failure (HCC) 09/11/2016   Anemia    Chronic kidney disease    ARF on CRF Dialysis T/TH/Sa   Diabetes mellitus    Type II   End stage renal disease on dialysis (HCC)    [place]    GERD (gastroesophageal reflux disease)    diet controlled   HLD (hyperlipidemia)    Hypertension    Hypothyroidism    Morbid obesity (HCC)    Sacral wound    resolved   Thyroid  disease     Family History  Problem Relation Age of Onset   Heart disease Mother    Hypertension Mother     Past  Surgical History:  Procedure Laterality Date   AMPUTATION Left 05/08/2021   Procedure: AMPUTATION GREAT TOE;  Surgeon: Harden Jerona GAILS, MD;  Location: [facility] OR;  Service: Orthopedics;  Laterality: Left;   AMPUTATION Left 10/01/2022   Procedure: LEFT FOOT AMPUTATION RAY 1st;  Surgeon: Harden Jerona GAILS, MD;  Location: [facility] OR;  Service: Orthopedics;  Laterality: Left;   AV FISTULA PLACEMENT Left 09/26/2016   Procedure: LEFT UPPER ARM ARTERIOVENOUS (AV) FISTULA CREATION;  Surgeon: Harvey Carlin BRAVO, MD;  Location: [facility] OR;  Service: Vascular;  Laterality: Left;   AV FISTULA PLACEMENT  Left 12/02/2016   Procedure: INSERTION OF ARTERIOVENOUS GORE-TEX GRAFT LEFT UPPER  ARM USING A 4-7MM BY 45CM GRAFT ;  Surgeon: Oris Krystal FALCON, MD;  Location: [facility] OR;  Service: Vascular;  Laterality: Left;   AV FISTULA PLACEMENT Right 07/03/2020   Procedure: INSERTION OF ARTERIOVENOUS (AV) GORE-TEX GRAFT RIGHT UPPER ARM;  Surgeon: Gretta Lonni PARAS, MD;  Location: [facility] OR;  Service: Vascular;  Laterality: Right;   AV FISTULA PLACEMENT Left 07/02/2021   Procedure: LEFT UPPER EXTREMITY ARTERIOVENOUS FISTULA CREATION;  Surgeon: Sheree Penne Lonni, MD;  Location: [facility] OR;  Service: Vascular;  Laterality: Left;   AV FISTULA PLACEMENT Right 05/12/2023   Procedure: RIGHT UPPER EXTREMITY ARTERIOVENOUS (AV) FISTULA CREATION;  Surgeon: Sheree Penne Lonni, MD;  Location: [facility] OR;  Service: Vascular;  Laterality: Right;   BASCILIC VEIN TRANSPOSITION Right 05/06/2020   Procedure: FIRST STAGE RIGHT BASCILIC VEIN TRANSPOSITION;  Surgeon: Serene Gaile ORN, MD;  Location: [facility] OR;  Service: Vascular;  Laterality: Right;   BASCILIC VEIN TRANSPOSITION Left 08/20/2021   Procedure: LEFT ARM SECOND STAGE WITH ARTERIOVENOUS GRAFT;  Surgeon: Sheree Penne Lonni, MD;  Location: [facility] OR;  Service: Vascular;  Laterality: Left;   EXCHANGE OF A DIALYSIS CATHETER Right 10/08/2016   Procedure: EXCHANGE OF A DIALYSIS CATHETER-RIGHT INTERNAL JUGULAR;  Surgeon: Laurence Redell CROME, MD;  Location: [facility] OR;  Service: Vascular;  Laterality: Right;   INSERTION OF DIALYSIS CATHETER Right 09/26/2016   Procedure: INSERTION OF DIALYSIS CATHETER - Right Internal Jugular Placement;  Surgeon: Harvey Carlin BRAVO, MD;  Location: [facility] OR;  Service: Vascular;  Laterality: Right;   INSERTION OF DIALYSIS CATHETER Left 03/22/2018   Procedure: INSERTION OF DIALYSIS CATHETER;  Surgeon: Gretta Lonni PARAS, MD;  Location: [facility] OR;  Service: Vascular;  Laterality: Left;   INTRAOPERATIVE ARTERIOGRAM Left 05/04/2022   Procedure: INTRA OPERATIVE ARTERIOGRAM;   Surgeon: Lanis Fonda BRAVO, MD;  Location: [facility] OR;  Service: Vascular;  Laterality: Left;   IR AV DIALY SHUNT INTRO NEEDLE/INTRAC INITIAL W/PTA/STENT/IMG LT Left 02/17/2023   IR AV DIALY SHUNT INTRO NEEDLE/INTRACATH INITIAL W/PTA/IMG LEFT  08/19/2022   IR AV DIALY SHUNT INTRO NEEDLE/INTRACATH INITIAL W/PTA/IMG LEFT  11/14/2022   IR DIALY SHUNT INTRO NEEDLE/INTRACATH INITIAL W/IMG LEFT Left 04/15/2022   IR FLUORO GUIDE CV LINE LEFT  04/19/2022   IR FLUORO GUIDE CV LINE LEFT  05/30/2022   IR FLUORO GUIDE CV LINE LEFT  03/24/2023   IR FLUORO GUIDE CV LINE RIGHT  03/26/2021   IR FLUORO GUIDE CV LINE RIGHT  05/13/2021   IR FLUORO GUIDE CV LINE RIGHT  05/04/2022   IR FLUORO GUIDE CV LINE RIGHT  02/17/2023   IR REMOVAL TUN CV CATH W/O FL  05/11/2021   IR REMOVAL TUN CV CATH W/O FL  10/06/2021   IR THROMBECTOMY AV FISTULA W/THROMBOLYSIS/PTA INC/SHUNT/IMG LEFT Left 02/02/2022   IR THROMBECTOMY AV FISTULA W/THROMBOLYSIS/PTA INC/SHUNT/IMG LEFT Left 03/22/2022  IR THROMBECTOMY AV FISTULA W/THROMBOLYSIS/PTA INC/SHUNT/IMG LEFT Left 05/04/2022   IR THROMBECTOMY AV FISTULA W/THROMBOLYSIS/PTA INC/SHUNT/IMG LEFT Left 12/09/2022   IR THROMBECTOMY AV FISTULA W/THROMBOLYSIS/PTA/STENT INC/SHUNT/IMG LT Left 04/27/2022   IR US  GUIDE VASC ACCESS LEFT  02/02/2022   IR US  GUIDE VASC ACCESS LEFT  03/22/2022   IR US  GUIDE VASC ACCESS LEFT  04/15/2022   IR US  GUIDE VASC ACCESS LEFT  04/19/2022   IR US  GUIDE VASC ACCESS LEFT  04/27/2022   IR US  GUIDE VASC ACCESS LEFT  12/13/2022   IR US  GUIDE VASC ACCESS LEFT  02/21/2023   IR US  GUIDE VASC ACCESS RIGHT  03/26/2021   IR US  GUIDE VASC ACCESS RIGHT  05/13/2021   IR US  GUIDE VASC ACCESS RIGHT  05/04/2022   IR US  GUIDE VASC ACCESS RIGHT  02/17/2023   REVISON OF ARTERIOVENOUS FISTULA Left 03/22/2018   Procedure: LEFT UPPER EXTREMITY ARTERIOVENOUS  REVISION WITH GORE-TEX GRAFT.;  Surgeon: Gretta Lonni PARAS, MD;  Location: [facility] OR;  Service: Vascular;  Laterality: Left;   TEE WITHOUT  CARDIOVERSION N/A 05/12/2021   Procedure: TRANSESOPHAGEAL ECHOCARDIOGRAM (TEE);  Surgeon: Santo Stanly LABOR, MD;  Location: [facility] ENDOSCOPY;  Service: Cardiovascular;  Laterality: N/A;   THROMBECTOMY Left 08/26/2019   thrombectomy of LUA loop AVG   THROMBECTOMY AND REVISION OF ARTERIOVENTOUS (AV) GORETEX  GRAFT Right 07/27/2020   Procedure: INSERTION OF RIGHT ARM LOOP GRAFT WITH EXCISION OF RIGHT ARM BRACHIAL AXILLARY GRAFT;  Surgeon: Gretta Lonni PARAS, MD;  Location: [facility] OR;  Service: Vascular;  Laterality: Right;   THROMBECTOMY BRACHIAL ARTERY Left 05/04/2022   Procedure: LEFT ARM RADIAL BRACHIAL ARTERY EMBOLECTOMY;  Surgeon: Lanis Fonda BRAVO, MD;  Location: [facility] OR;  Service: Vascular;  Laterality: Left;   UPPER EXTREMITY VENOGRAPHY Bilateral 06/14/2021   Procedure: UPPER EXTREMITY VENOGRAPHY;  Surgeon: Sheree Penne Lonni, MD;  Location: [facility] INVASIVE CV LAB;  Service: Cardiovascular;  Laterality: Bilateral;   UPPER EXTREMITY VENOGRAPHY N/A 04/24/2023   Procedure: UPPER EXTREMITY VENOGRAPHY;  Surgeon: Sheree Penne Lonni, MD;  Location: [facility] INVASIVE CV LAB;  Service: Cardiovascular;  Laterality: N/A;   VISCERAL ANGIOGRAPHY Right 06/17/2020   Procedure: CENTRAL VENO;  Surgeon: Magda Debby SAILOR, MD;  Location: [facility] INVASIVE CV LAB;  Service: Cardiovascular;  Laterality: Right;   Social History   Occupational History   Not on file  Tobacco Use   Smoking status: Never   Smokeless tobacco: Never  Vaping Use   Vaping status: Never Used  Substance and Sexual Activity   Alcohol use: No   Drug use: Yes    Frequency: 2.0 times per week    Types: Marijuana    Comment: 1-2 times day   Sexual activity: Yes

## 2023-06-07 ENCOUNTER — Other Ambulatory Visit: Payer: Self-pay | Admitting: Student

## 2023-06-07 DIAGNOSIS — Z794 Long term (current) use of insulin: Secondary | ICD-10-CM

## 2023-06-12 ENCOUNTER — Other Ambulatory Visit: Payer: Self-pay | Admitting: Student

## 2023-06-14 ENCOUNTER — Ambulatory Visit: Payer: Medicaid Other | Admitting: Family

## 2023-06-15 ENCOUNTER — Other Ambulatory Visit: Payer: Self-pay | Admitting: *Deleted

## 2023-06-15 DIAGNOSIS — N186 End stage renal disease: Secondary | ICD-10-CM

## 2023-06-21 ENCOUNTER — Ambulatory Visit (HOSPITAL_COMMUNITY)
Admission: RE | Admit: 2023-06-21 | Discharge: 2023-06-21 | Disposition: A | Discharge status: Home / Self Care | Payer: Medicaid Other | Attending: Nephrology | Admitting: Nephrology

## 2023-06-21 ENCOUNTER — Encounter (HOSPITAL_COMMUNITY)
Admission: RE | Disposition: A | Discharge status: Home / Self Care | Payer: Self-pay | Source: Home / Self Care | Attending: Nephrology

## 2023-06-21 ENCOUNTER — Other Ambulatory Visit: Payer: Self-pay

## 2023-06-21 DIAGNOSIS — T8249XA Other complication of vascular dialysis catheter, initial encounter: Secondary | ICD-10-CM | POA: Insufficient documentation

## 2023-06-21 DIAGNOSIS — N250 Renal osteodystrophy: Secondary | ICD-10-CM | POA: Diagnosis not present

## 2023-06-21 DIAGNOSIS — Y839 Surgical procedure, unspecified as the cause of abnormal reaction of the patient, or of later complication, without mention of misadventure at the time of the procedure: Secondary | ICD-10-CM | POA: Diagnosis not present

## 2023-06-21 DIAGNOSIS — Z992 Dependence on renal dialysis: Secondary | ICD-10-CM | POA: Diagnosis not present

## 2023-06-21 DIAGNOSIS — I120 Hypertensive chronic kidney disease with stage 5 chronic kidney disease or end stage renal disease: Secondary | ICD-10-CM | POA: Diagnosis not present

## 2023-06-21 DIAGNOSIS — Z794 Long term (current) use of insulin: Secondary | ICD-10-CM | POA: Insufficient documentation

## 2023-06-21 DIAGNOSIS — E1122 Type 2 diabetes mellitus with diabetic chronic kidney disease: Secondary | ICD-10-CM | POA: Insufficient documentation

## 2023-06-21 DIAGNOSIS — N186 End stage renal disease: Secondary | ICD-10-CM | POA: Diagnosis not present

## 2023-06-21 DIAGNOSIS — D631 Anemia in chronic kidney disease: Secondary | ICD-10-CM | POA: Insufficient documentation

## 2023-06-21 HISTORY — PX: PERIPHERAL VASCULAR BALLOON ANGIOPLASTY: CATH118281

## 2023-06-21 HISTORY — PX: DIALYSIS/PERMA CATHETER REMOVAL: CATH118289

## 2023-06-21 HISTORY — PX: DIALYSIS/PERMA CATHETER INSERTION: CATH118288

## 2023-06-21 SURGERY — DIALYSIS/PERMA CATHETER REMOVAL

## 2023-06-21 MED ORDER — FENTANYL CITRATE (PF) 100 MCG/2ML IJ SOLN
INTRAMUSCULAR | Status: DC | PRN
  Administered 2023-06-21: 25 ug via INTRAVENOUS

## 2023-06-21 MED ORDER — LIDOCAINE HCL (PF) 1 % IJ SOLN
INTRAMUSCULAR | Status: DC | PRN
  Administered 2023-06-21: 5 mL via SUBCUTANEOUS

## 2023-06-21 MED ORDER — IODIXANOL 320 MG/ML IV SOLN
INTRAVENOUS | Status: DC | PRN
  Administered 2023-06-21: 4 mL via INTRAVENOUS

## 2023-06-21 MED ORDER — ACETAMINOPHEN 325 MG PO TABS
650.0000 mg | ORAL_TABLET | ORAL | Status: DC | PRN
Start: 2023-06-21 — End: 2023-06-21

## 2023-06-21 MED ORDER — FENTANYL CITRATE (PF) 100 MCG/2ML IJ SOLN
INTRAMUSCULAR | Status: AC
  Filled 2023-06-21: qty 2

## 2023-06-21 MED ORDER — HEPARIN SODIUM (PORCINE) 1000 UNIT/ML IJ SOLN
INTRAMUSCULAR | Status: AC
  Filled 2023-06-21: qty 10

## 2023-06-21 MED ORDER — SODIUM CHLORIDE 0.9 % IV SOLN: INTRAVENOUS | Status: DC

## 2023-06-21 MED ORDER — MIDAZOLAM HCL 2 MG/2ML IJ SOLN
INTRAMUSCULAR | Status: DC | PRN
  Administered 2023-06-21: 1 mg via INTRAVENOUS

## 2023-06-21 MED ORDER — LIDOCAINE HCL (PF) 1 % IJ SOLN
INTRAMUSCULAR | Status: AC
  Filled 2023-06-21: qty 30

## 2023-06-21 MED ORDER — SODIUM CHLORIDE 0.9% FLUSH: 3.0000 mL | INTRAVENOUS | Status: DC | PRN

## 2023-06-21 MED ORDER — HEPARIN SODIUM (PORCINE) 1000 UNIT/ML IJ SOLN
INTRAMUSCULAR | Status: DC | PRN
  Administered 2023-06-21: 3800 [IU] via INTRAVENOUS

## 2023-06-21 MED ORDER — HEPARIN (PORCINE) IN NACL 1000-0.9 UT/500ML-% IV SOLN
INTRAVENOUS | Status: DC | PRN
  Administered 2023-06-21: 500 mL

## 2023-06-21 MED ORDER — MIDAZOLAM HCL 2 MG/2ML IJ SOLN
INTRAMUSCULAR | Status: AC
  Filled 2023-06-21: qty 2

## 2023-06-21 MED ORDER — ONDANSETRON HCL 4 MG/2ML IJ SOLN: 4.0000 mg | Freq: Four times a day (QID) | INTRAMUSCULAR | Status: DC | PRN

## 2023-06-21 SURGICAL SUPPLY — 8 items
BAG SNAP BAND KOVER 36X36 (MISCELLANEOUS) IMPLANT
BALLN MUSTANG 10.0X40 75 (BALLOONS) ×2
BALLOON MUSTANG 10.0X40 75 (BALLOONS) IMPLANT
CATH PALINDROME-P 23 W/VT (CATHETERS) IMPLANT
COVER DOME SNAP 22 D (MISCELLANEOUS) IMPLANT
GUIDEWIRE ZIPWIRE 035/150 ANGL (WIRE) IMPLANT
SYR MEDALLION 10ML (SYRINGE) IMPLANT
TRAY PV CATH (CUSTOM PROCEDURE TRAY) ×2 IMPLANT

## 2023-06-21 NOTE — Discharge Instructions (Signed)

## 2023-06-21 NOTE — H&P (Addendum)
Chief Complaint: Decreased flows  Interval H&P   The patient has presented today for tunneled catheter exchange.  Various methods of treatment have been discussed with the patient.  After consideration of risk, benefits and other options for treatment, the patient has consented to a tunneled catheter exchange.   Risks  of bleeding, pain, infection, nonhealing wound, lung and carotid artery injury were explained to the patient.  The patient's history has been reviewed and the patient has been examined, no changes in status.  Stable for tunneled catheter insertion.  I have reviewed the patient's chart and labs.  Questions were answered to the patient's satisfaction.   Assessment/Plan: ESRD dialyzing at [facility] TTS. Decreased access flows - planning on catheter exchange with possibly angioplasty.  Should also has a right upper arm brachiobasilic fistula placed May 12, 2023 by Dr. Randie Heinz requiring a arterectomy at the site of a prior graft anastomosis to the arteries. Renal osteodystrophy - continue binders per home regimen. Anemia - managed with ESA's and IV iron at dialysis center. HTN - resume home regimen.   HPI: Paul [name] is an 33 y.o. male with a history of hypertension, hyperlipidemia, diabetes, end-stage renal disease currently dialyzing Tuesday Thursdays and Saturdays.  Patient has a right upper arm brachiobasilic fistula which has not been transposed yet, placed May 12, 2023 by Dr. Randie Heinz.  Patient is here because of poor return through the tunneled catheter and referred for an exchange.  ROS Per HPI.  Chemistry and CBC: Creat  Date/Time Value Ref Range Status  11/28/2014 11:53 AM 1.85 (H) 0.50 - 1.35 mg/dL Final  16/02/9603 54:09 AM 1.63 (H) 0.50 - 1.35 mg/dL Final  81/19/1478 29:56 AM 1.03 0.50 - 1.35 mg/dL Final  21/30/8657 84:69 AM 1.00 0.50 - 1.35 mg/dL Final   Creatinine, Ser  Date/Time Value Ref Range Status  05/12/2023 07:32 AM 14.40 (H) 0.61 - 1.24 mg/dL  Final  62/95/2841 32:44 AM 17.10 (H) 0.61 - 1.24 mg/dL Final  05/25/7251 66:44 PM >18.00 (H) 0.61 - 1.24 mg/dL Final  03/47/4259 56:38 PM 15.47 (H) 0.61 - 1.24 mg/dL Final  75/64/3329 51:88 AM 15.17 (H) 0.61 - 1.24 mg/dL Final  41/66/0630 16:01 AM 12.87 (H) 0.61 - 1.24 mg/dL Final  09/32/3557 32:20 PM 11.31 (H) 0.61 - 1.24 mg/dL Final  25/42/7062 37:62 AM 14.80 (H) 0.61 - 1.24 mg/dL Final  83/15/1761 60:73 PM 14.80 (H) 0.61 - 1.24 mg/dL Final  71/10/2692 85:46 PM 14.16 (H) 0.61 - 1.24 mg/dL Final  27/07/5007 38:18 AM 17.74 (H) 0.61 - 1.24 mg/dL Final  29/93/7169 67:89 AM 27.99 (H) 0.61 - 1.24 mg/dL Final    Comment:    RESULT CONFIRMED BY MANUAL DILUTION  04/18/2022 10:14 AM 25.22 (H) 0.61 - 1.24 mg/dL Final    Comment:    RESULT CONFIRMED BY MANUAL DILUTION  03/22/2022 07:44 AM 19.37 (H) 0.61 - 1.24 mg/dL Final  38/02/1750 02:58 PM 19.13 (H) 0.61 - 1.24 mg/dL Final  52/77/8242 35:36 AM 14.70 (H) 0.61 - 1.24 mg/dL Final  14/43/1540 08:67 AM 16.43 (H) 0.61 - 1.24 mg/dL Final  61/95/0932 67:12 AM 14.60 (H) 0.61 - 1.24 mg/dL Final  45/80/9983 38:25 AM 16.10 (H) 0.61 - 1.24 mg/dL Final  05/39/7673 41:93 AM 12.74 (H) 0.61 - 1.24 mg/dL Final  79/06/4095 35:32 AM 12.87 (H) 0.61 - 1.24 mg/dL Final  99/24/2683 41:96 AM 16.25 (H) 0.61 - 1.24 mg/dL Final  22/29/7989 21:19 AM 16.36 (H) 0.61 - 1.24 mg/dL Final  41/74/0814 48:18 AM 13.15 (H) 0.61 -  1.24 mg/dL Final  16/02/9603 54:09 PM 11.66 (H) 0.61 - 1.24 mg/dL Final  81/19/1478 29:56 PM 17.86 (H) 0.61 - 1.24 mg/dL Final  21/30/8657 84:69 AM 17.00 (H) 0.61 - 1.24 mg/dL Final  62/95/2841 32:44 AM 16.30 (H) 0.61 - 1.24 mg/dL Final  05/25/7251 66:44 AM >18.00 (H) 0.61 - 1.24 mg/dL Final  03/47/4259 56:38 AM 17.60 (H) 0.61 - 1.24 mg/dL Final  75/64/3329 51:88 AM 12.06 (H) 0.76 - 1.27 mg/dL Final    Comment:    **Verified by repeat analysis**  03/22/2018 10:35 AM 15.14 (H) 0.61 - 1.24 mg/dL Final  41/66/0630 16:01 AM 15.30 (H) 0.61 - 1.24 mg/dL  Final  09/32/3557 32:20 AM 13.63 (H) 0.76 - 1.27 mg/dL Final    Comment:    **Verified by repeat analysis**  11/28/2016 09:29 AM 9.76 (H) 0.61 - 1.24 mg/dL Final  25/42/7062 37:62 AM 10.45 (H) 0.61 - 1.24 mg/dL Final  83/15/1761 60:73 AM 15.02 (H) 0.61 - 1.24 mg/dL Final  71/10/2692 85:46 AM 15.88 (H) 0.61 - 1.24 mg/dL Final  27/07/5007 38:18 AM 13.21 (H) 0.61 - 1.24 mg/dL Final  29/93/7169 67:89 PM 11.52 (H) 0.61 - 1.24 mg/dL Final  38/02/1750 02:58 AM 11.04 (H) 0.61 - 1.24 mg/dL Final  52/77/8242 35:36 AM 8.00 (H) 0.61 - 1.24 mg/dL Final  14/43/1540 08:67 AM 10.45 (H) 0.61 - 1.24 mg/dL Final  61/95/0932 67:12 AM 7.29 (H) 0.61 - 1.24 mg/dL Final  45/80/9983 38:25 AM 8.32 (H) 0.61 - 1.24 mg/dL Final    Comment:    DELTA CHECK NOTED  09/18/2016 04:34 AM 5.39 (H) 0.61 - 1.24 mg/dL Final  05/39/7673 41:93 AM 5.09 (H) 0.61 - 1.24 mg/dL Final  79/06/4095 35:32 PM 3.76 (H) 0.61 - 1.24 mg/dL Final  99/24/2683 41:96 AM 5.26 (H) 0.61 - 1.24 mg/dL Final  22/29/7989 21:19 PM 4.16 (H) 0.61 - 1.24 mg/dL Final  41/74/0814 48:18 AM 4.42 (H) 0.61 - 1.24 mg/dL Final  56/31/4970 26:37 PM 4.64 (H) 0.61 - 1.24 mg/dL Final   No results for input(s): "NA", "K", "CL", "CO2", "GLUCOSE", "BUN", "CREATININE", "CALCIUM", "PHOS" in the last 168 hours.  Invalid input(s): "ALB" No results for input(s): "WBC", "NEUTROABS", "HGB", "HCT", "MCV", "PLT" in the last 168 hours. Liver Function Tests: No results for input(s): "AST", "ALT", "ALKPHOS", "BILITOT", "PROT", "ALBUMIN" in the last 168 hours. No results for input(s): "LIPASE", "AMYLASE" in the last 168 hours. No results for input(s): "AMMONIA" in the last 168 hours. Cardiac Enzymes: No results for input(s): "CKTOTAL", "CKMB", "CKMBINDEX", "TROPONINI" in the last 168 hours. Iron Studies: No results for input(s): "IRON", "TIBC", "TRANSFERRIN", "FERRITIN" in the last 72 hours. PT/INR: @LABRCNTIP (inr:5)  Xrays/Other Studies: )No results found for this or any  previous visit (from the past 48 hours). No results found.  PMH:   Past Medical History:  Diagnosis Date   Acute hypoxemic respiratory failure (HCC) 09/11/2016   Anemia    Chronic kidney disease    ARF on CRF Dialysis T/TH/Sa   Diabetes mellitus    Type II   End stage renal disease on dialysis [facility])    [place]    GERD (gastroesophageal reflux disease)    diet controlled   HLD (hyperlipidemia)    Hypertension    Hypothyroidism    Morbid obesity (HCC)    Sacral wound    resolved   Thyroid disease     PSH:   Past Surgical History:  Procedure Laterality Date   AMPUTATION Left 05/08/2021   Procedure: AMPUTATION GREAT  TOE;  Surgeon: Nadara Mustard, MD;  Location[facility] OR;  Service: Orthopedics;  Laterality: Left;   AMPUTATION Left 10/01/2022   Procedure: LEFT FOOT AMPUTATION RAY 1st;  Surgeon: Nadara Mustard, MD;  Location: [facility] OR;  Service: Orthopedics;  Laterality: Left;   AV FISTULA PLACEMENT Left 09/26/2016   Procedure: LEFT UPPER ARM ARTERIOVENOUS (AV) FISTULA CREATION;  Surgeon: Sherren Kerns, MD;  Location: [facility] OR;  Service: Vascular;  Laterality: Left;   AV FISTULA PLACEMENT Left 12/02/2016   Procedure: INSERTION OF ARTERIOVENOUS GORE-TEX GRAFT LEFT UPPER  ARM USING A 4-7MM BY 45CM GRAFT ;  Surgeon: Larina Earthly, MD;  Location: [facility] OR;  Service: Vascular;  Laterality: Left;   AV FISTULA PLACEMENT Right 07/03/2020   Procedure: INSERTION OF ARTERIOVENOUS (AV) GORE-TEX GRAFT RIGHT UPPER ARM;  Surgeon: Cephus Shelling, MD;  Location: [facility] OR;  Service: Vascular;  Laterality: Right;   AV FISTULA PLACEMENT Left 07/02/2021   Procedure: LEFT UPPER EXTREMITY ARTERIOVENOUS FISTULA CREATION;  Surgeon: Maeola Harman, MD;  Location: [facility] OR;  Service: Vascular;  Laterality: Left;   AV FISTULA PLACEMENT Right 05/12/2023   Procedure: RIGHT UPPER EXTREMITY ARTERIOVENOUS (AV) FISTULA CREATION;  Surgeon: Maeola Harman, MD;  Location: [facility] OR;  Service: Vascular;   Laterality: Right;   BASCILIC VEIN TRANSPOSITION Right 05/06/2020   Procedure: FIRST STAGE RIGHT BASCILIC VEIN TRANSPOSITION;  Surgeon: Nada Libman, MD;  Location: [facility] OR;  Service: Vascular;  Laterality: Right;   BASCILIC VEIN TRANSPOSITION Left 08/20/2021   Procedure: LEFT ARM SECOND STAGE WITH ARTERIOVENOUS GRAFT;  Surgeon: Maeola Harman, MD;  Location: [facility] OR;  Service: Vascular;  Laterality: Left;   EXCHANGE OF A DIALYSIS CATHETER Right 10/08/2016   Procedure: EXCHANGE OF A DIALYSIS CATHETER-RIGHT INTERNAL JUGULAR;  Surgeon: Fransisco Hertz, MD;  Location: [facility] OR;  Service: Vascular;  Laterality: Right;   INSERTION OF DIALYSIS CATHETER Right 09/26/2016   Procedure: INSERTION OF DIALYSIS CATHETER - Right Internal Jugular Placement;  Surgeon: Sherren Kerns, MD;  Location: [facility] OR;  Service: Vascular;  Laterality: Right;   INSERTION OF DIALYSIS CATHETER Left 03/22/2018   Procedure: INSERTION OF DIALYSIS CATHETER;  Surgeon: Cephus Shelling, MD;  Location: [facility] OR;  Service: Vascular;  Laterality: Left;   INTRAOPERATIVE ARTERIOGRAM Left 05/04/2022   Procedure: INTRA OPERATIVE ARTERIOGRAM;  Surgeon: Victorino Sparrow, MD;  Location: [facility] OR;  Service: Vascular;  Laterality: Left;   IR AV DIALY SHUNT INTRO NEEDLE/INTRAC INITIAL W/PTA/STENT/IMG LT Left 02/17/2023   IR AV DIALY SHUNT INTRO NEEDLE/INTRACATH INITIAL W/PTA/IMG LEFT  08/19/2022   IR AV DIALY SHUNT INTRO NEEDLE/INTRACATH INITIAL W/PTA/IMG LEFT  11/14/2022   IR DIALY SHUNT INTRO NEEDLE/INTRACATH INITIAL W/IMG LEFT Left 04/15/2022   IR FLUORO GUIDE CV LINE LEFT  04/19/2022   IR FLUORO GUIDE CV LINE LEFT  05/30/2022   IR FLUORO GUIDE CV LINE LEFT  03/24/2023   IR FLUORO GUIDE CV LINE RIGHT  03/26/2021   IR FLUORO GUIDE CV LINE RIGHT  05/13/2021   IR FLUORO GUIDE CV LINE RIGHT  05/04/2022   IR FLUORO GUIDE CV LINE RIGHT  02/17/2023   IR REMOVAL TUN CV CATH W/O FL  05/11/2021   IR REMOVAL TUN CV CATH W/O FL  10/06/2021   IR THROMBECTOMY  AV FISTULA W/THROMBOLYSIS/PTA INC/SHUNT/IMG LEFT Left 02/02/2022   IR THROMBECTOMY AV FISTULA W/THROMBOLYSIS/PTA INC/SHUNT/IMG LEFT Left 03/22/2022   IR THROMBECTOMY AV FISTULA W/THROMBOLYSIS/PTA INC/SHUNT/IMG LEFT Left 05/04/2022   IR THROMBECTOMY AV FISTULA  W/THROMBOLYSIS/PTA INC/SHUNT/IMG LEFT Left 12/09/2022   IR THROMBECTOMY AV FISTULA W/THROMBOLYSIS/PTA/STENT INC/SHUNT/IMG LT Left 04/27/2022   IR US GUIDE VASC ACCESS LEFT  02/02/2022   IR US GUIDE VASC ACCESS LEFT  03/22/2022   IR US GUIDE VASC ACCESS LEFT  04/15/2022   IR US GUIDE VASC ACCESS LEFT  04/19/2022   IR US GUIDE VASC ACCESS LEFT  04/27/2022   IR US GUIDE VASC ACCESS LEFT  12/13/2022   IR US GUIDE VASC ACCESS LEFT  02/21/2023   IR US GUIDE VASC ACCESS RIGHT  03/26/2021   IR US GUIDE VASC ACCESS RIGHT  05/13/2021   IR US GUIDE VASC ACCESS RIGHT  05/04/2022   IR US GUIDE VASC ACCESS RIGHT  02/17/2023   REVISON OF ARTERIOVENOUS FISTULA Left 03/22/2018   Procedure: LEFT UPPER EXTREMITY ARTERIOVENOUS  REVISION WITH GORE-TEX GRAFT.;  Surgeon: Cephus Shelling, MD;  Location: [facility] OR;  Service: Vascular;  Laterality: Left;   TEE WITHOUT CARDIOVERSION N/A 05/12/2021   Procedure: TRANSESOPHAGEAL ECHOCARDIOGRAM (TEE);  Surgeon: Christell Constant, MD;  Location: [facility] ENDOSCOPY;  Service: Cardiovascular;  Laterality: N/A;   THROMBECTOMY Left 08/26/2019   thrombectomy of LUA loop AVG   THROMBECTOMY AND REVISION OF ARTERIOVENTOUS (AV) GORETEX  GRAFT Right 07/27/2020   Procedure: INSERTION OF RIGHT ARM LOOP GRAFT WITH EXCISION OF RIGHT ARM BRACHIAL AXILLARY GRAFT;  Surgeon: Cephus Shelling, MD;  Location: [facility] OR;  Service: Vascular;  Laterality: Right;   THROMBECTOMY BRACHIAL ARTERY Left 05/04/2022   Procedure: LEFT ARM RADIAL BRACHIAL ARTERY EMBOLECTOMY;  Surgeon: Victorino Sparrow, MD;  Location: [facility] OR;  Service: Vascular;  Laterality: Left;   UPPER EXTREMITY VENOGRAPHY Bilateral 06/14/2021   Procedure: UPPER EXTREMITY VENOGRAPHY;   Surgeon: Maeola Harman, MD;  Location: [facility] INVASIVE CV LAB;  Service: Cardiovascular;  Laterality: Bilateral;   UPPER EXTREMITY VENOGRAPHY N/A 04/24/2023   Procedure: UPPER EXTREMITY VENOGRAPHY;  Surgeon: Maeola Harman, MD;  Location: [facility] INVASIVE CV LAB;  Service: Cardiovascular;  Laterality: N/A;   VISCERAL ANGIOGRAPHY Right 06/17/2020   Procedure: CENTRAL VENO;  Surgeon: Leonie Douglas, MD;  Location: [facility] INVASIVE CV LAB;  Service: Cardiovascular;  Laterality: Right;    Allergies:  Allergies  Allergen Reactions   Hydralazine Hcl Other (See Comments)    DRUG-INDUCED LUPUS    Medications:   Prior to Admission medications   Medication Sig Start Date End Date Taking? Authorizing Provider  ACCU-CHEK GUIDE test strip USE AS DIRECTED TWICE DAILY 12/08/22   Darral Dash, DO  Accu-Chek Softclix Lancets lancets Use as instructed 06/11/21   Alicia Amel, MD  ascorbic acid (VITAMIN C) 1000 MG tablet Take 1 tablet (1,000 mg total) by mouth daily. Patient not taking: Reported on 05/09/2023 10/03/22   Darral Dash, DO  aspirin EC 81 MG tablet Take 1 tablet (81 mg total) by mouth daily. Swallow whole. 05/11/22   Dameron, Nolberto Hanlon, DO  Blood Glucose Monitoring Suppl (ACCU-CHEK GUIDE ME) w/Device KIT 1 kit by Does not apply route daily. 05/22/21   Alicia Amel, MD  calcium carbonate (TUMS - DOSED IN MG ELEMENTAL CALCIUM) 500 MG chewable tablet Chew 1,000 mg by mouth daily as needed for indigestion or heartburn. Patient not taking: Reported on 05/09/2023    [provider]  cetirizine (ZYRTEC) 10 MG tablet Take 1 tablet (10 mg total) by mouth daily as needed for allergies. Patient not taking: Reported on 05/09/2023 05/11/22   Dameron, Nolberto Hanlon, DO  ELIQUIS 5 MG TABS tablet Take 1  tablet by mouth twice daily 06/12/23   Dameron, Nolberto Hanlon, DO  ferric citrate (AURYXIA) 1 GM 210 MG(Fe) tablet Take 2-4 tablets (420-840 mg total) by mouth See admin instructions. Take 4 tablets  (840 mg) by mouth with each meal & take 2 tablets (420 mg) by mouth with each snack. Patient not taking: Reported on 05/09/2023 05/11/22   Darral Dash, DO  gabapentin (NEURONTIN) 100 MG capsule TAKE 1 CAPSULE BY MOUTH THREE TIMES DAILY 05/09/22   Maeola Harman, MD  gabapentin (NEURONTIN) 300 MG capsule Take 1 capsule by mouth at bedtime Patient not taking: Reported on 05/09/2023 07/25/22   Darral Dash, DO  LANTUS 100 UNIT/ML injection INJECT 5 UNITS SUBCUTANEOUSLY TWICE DAILY 06/08/23   Dameron, Nolberto Hanlon, DO  levothyroxine (SYNTHROID) 150 MCG tablet TAKE 1 TABLET BY MOUTH ONCE DAILY BEFORE BREAKFAST *APPOINTMENT NEEDED FOR FURTHER REFILLS* 09/26/22   Dameron, Nolberto Hanlon, DO  midodrine (PROAMATINE) 5 MG tablet Take 1 tablet (5 mg total) by mouth Every Tuesday,Thursday,and Saturday with dialysis. Patient not taking: Reported on 05/09/2023 05/12/22   Darral Dash, DO  multivitamin (RENA-VIT) TABS tablet Take 1 tablet by mouth at bedtime. 05/11/22   Dameron, Nolberto Hanlon, DO  oxyCODONE (OXY IR/ROXICODONE) 5 MG immediate release tablet Take 1 tablet (5 mg total) by mouth every 6 (six) hours as needed for moderate pain (pain score 4-6) (pain score 4-6). 05/12/23   Emilie Rutter, PA-C  polyethylene glycol (MIRALAX / GLYCOLAX) 17 g packet Take 17 g by mouth daily as needed for mild constipation. Patient not taking: Reported on 05/09/2023 10/02/22   Darral Dash, DO  rosuvastatin (CRESTOR) 20 MG tablet Take 1 tablet by mouth once daily 03/21/23   Dameron, Nolberto Hanlon, DO  Syringe, Disposable, (B-D SYRINGE SLIP TIP 30CC) 30 ML MISC 1 Syringe by Does not apply route daily. 01/31/19   Melene Plan, MD  zinc sulfate 220 (50 Zn) MG capsule Take 1 capsule (220 mg total) by mouth daily. Patient not taking: Reported on 05/09/2023 10/03/22   Darral Dash, DO    Discontinued Meds:  There are no discontinued medications.  Social History:  reports that he has never smoked. He has never used smokeless  tobacco. He reports current drug use. Frequency: 2.00 times per week. Drug: Marijuana. He reports that he does not drink alcohol.  Family History:   Family History  Problem Relation Age of Onset   Heart disease Mother    Hypertension Mother     There were no vitals taken for this visit. General: Well appearing man, NAD. Room air Heart: RRR; no murmur Lungs: CTA anteriorly Abdomen: soft Extremities: no LE edema Dialysis Access: Rt BBF + bruit        Ronith Berti, Len Blalock, MD 06/21/2023, 10:41 AM

## 2023-06-21 NOTE — Progress Notes (Signed)
Called to evaluate patient's right upper extremity incision.  He has superficial eschar and a healing incision made 05/12/2023 for brachiobasilic AV fistula on the right.  On exam, he has an unroofed superficial eschar involving the dermis only near the incision in the right upper extremity.  This does not involve the subcutaneous tissues and appears to be healing appropriately.  This does not appear to need any surgical intervention at this moment.  Will get him into the office in short interval follow-up to monitor the wound.    Rande Brunt. Lenell Antu, MD [facility] Phone Number: [phone] 06/21/2023 11:53 AM

## 2023-06-21 NOTE — Op Note (Addendum)
Patient presents with poor flows in his right IJ tunneled hemodialysis catheter (23 cm cuff to tip- Palindrome) that was last exchanged on March 24, 2023 by [facility]. On examination, aspiration from both ports is good however flushing from the arterial port is sluggish (alteplase instilled yesterday). Fluoroscopy confirms the catheter tip is appropriately positioned in the RA. The catheter cuff is 1 cm deep to the exit site.    Summary:  1) The patient had a successful 23 cm CTT Palindrome hemodialysis catheter exchange in the right internal jugular vein. 2) Restriction noted in the innominate vein treated with a 10 mm x4cm Mustang balloon angioplasty.   3) Okay to use catheter immediately.  Description of procedure: The  right neck, chest and the catheter were prepped and draped in the usual sterile fashion. The exit site and adjacent tunnel track were anesthetized with lidocaine 1% with epinephrine. The cuff was removed by traction/blunt dissection. Venogram performed by sequentially injecting contrast through both ports demonstrated a significant restriction in the right innominate vein prior to its transition with the SVC. Wire was manipulated and then advanced into the IVC.   The catheter was removed and replaced with a  10x4 Mustang angioplasty balloon. The restriction in the innominate vein stenosis was treated with balloon angioplasty (86578) with the  10x4 Mustang angioplasty balloon to full effacement.   A new  23 cm cuff to tip Palindrome catheter ([id number]) was then inserted over the wire under real time fluoroscopic guidance. I then advanced the catheter till the tip was in the right atrium, the cuff was 2cm deep to the exit site. Aspiration and flushing of both limbs of the catheter confirmed excellent flush and return through both ports.   The hub was sutured to the right upper chest area with 2-0 nylon sutures. Sterile dressings were placed, and the patient returned to recovery in stable  condition.  Sedation: 1mg  Versed, 50 mcg Fentanyl.  Sedation time. 35 min  Contrast. 4  mL  Monitoring: Because of the patient's comorbid conditions and sedation during the procedure, continuous EKG monitoring and O2 saturation monitoring was performed throughout the procedure by the RN. There were no abnormal arrhythmias encountered.  Complications: None.  Diagnoses:   T82.49XA Other complication of vascular dialysis catheter (Poor flows) N18.6 End stage renal disease  Z99.2 Dialysis dependence  Procedures Coding:  36581 Tunneled catheter exchange 37248 Venous angioplasty, innominate vein  95284  Fluoroscopy guidance for catheter exchange. X3244 Contrast  Recommendations: Remove the suture in 3 weeks. 2.   Report any blood flow problems to  [facility] (contact is Destiny).  Discharge: The patient was discharged home in stable condition. The patient was given education regarding the care of the catheter and specific instructions in case of any problems.

## 2023-06-22 ENCOUNTER — Encounter (HOSPITAL_COMMUNITY): Payer: Self-pay | Admitting: Nephrology

## 2023-06-23 DIAGNOSIS — E1129 Type 2 diabetes mellitus with other diabetic kidney complication: Secondary | ICD-10-CM | POA: Diagnosis not present

## 2023-06-23 DIAGNOSIS — N186 End stage renal disease: Secondary | ICD-10-CM | POA: Diagnosis not present

## 2023-06-23 DIAGNOSIS — Z992 Dependence on renal dialysis: Secondary | ICD-10-CM | POA: Diagnosis not present

## 2023-06-28 ENCOUNTER — Encounter: Payer: Self-pay | Admitting: Family

## 2023-06-28 ENCOUNTER — Ambulatory Visit (INDEPENDENT_AMBULATORY_CARE_PROVIDER_SITE_OTHER): Payer: Medicaid Other | Admitting: Physician Assistant

## 2023-06-28 ENCOUNTER — Ambulatory Visit (INDEPENDENT_AMBULATORY_CARE_PROVIDER_SITE_OTHER): Payer: Medicaid Other | Admitting: Family

## 2023-06-28 ENCOUNTER — Ambulatory Visit (HOSPITAL_COMMUNITY)
Admission: RE | Admit: 2023-06-28 | Discharge: 2023-06-28 | Disposition: A | Discharge status: Home / Self Care | Payer: Medicaid Other | Source: Ambulatory Visit | Attending: Vascular Surgery | Admitting: Vascular Surgery

## 2023-06-28 VITALS — BP 129/86 | HR 98 | Temp 97.8°F | Resp 20 | Ht 74.0 in | Wt 354.5 lb

## 2023-06-28 DIAGNOSIS — N186 End stage renal disease: Secondary | ICD-10-CM | POA: Insufficient documentation

## 2023-06-28 DIAGNOSIS — Z89432 Acquired absence of left foot: Secondary | ICD-10-CM

## 2023-06-28 NOTE — Progress Notes (Signed)
 Office Note     CC:  follow up Requesting Provider:  Dartha Geralds, DO  HPI: Paul [name] is a 33 y.o. (12/23/1990) male who presents status post right first stage basilic vein fistula creation by Dr. Sheree involving endarterectomy of the brachial artery on 05/12/2023.  He is dialyzing via right IJ [facility] which was recently exchanged by Dr. Melia.  He dialyzes on a Tuesday Thursday Saturday schedule.  He denies any steal symptoms in his right hand.  He believes the incisions have healed.  He is on Eliquis .   Past Medical History:  Diagnosis Date   Acute hypoxemic respiratory failure (HCC) 09/11/2016   Anemia    Chronic kidney disease    ARF on CRF Dialysis T/TH/Sa   Diabetes mellitus    Type II   End stage renal disease on dialysis (HCC)    [name]    GERD (gastroesophageal reflux disease)    diet controlled   HLD (hyperlipidemia)    Hypertension    Hypothyroidism    Morbid obesity (HCC)    Sacral wound    resolved   Thyroid  disease     Past Surgical History:  Procedure Laterality Date   AMPUTATION Left 05/08/2021   Procedure: AMPUTATION GREAT TOE;  Surgeon: Harden Jerona GAILS, MD;  Location: [facility] OR;  Service: Orthopedics;  Laterality: Left;   AMPUTATION Left 10/01/2022   Procedure: LEFT FOOT AMPUTATION RAY 1st;  Surgeon: Harden Jerona GAILS, MD;  Location: [facility] OR;  Service: Orthopedics;  Laterality: Left;   AV FISTULA PLACEMENT Left 09/26/2016   Procedure: LEFT UPPER ARM ARTERIOVENOUS (AV) FISTULA CREATION;  Surgeon: Harvey Carlin BRAVO, MD;  Location: [facility] OR;  Service: Vascular;  Laterality: Left;   AV FISTULA PLACEMENT Left 12/02/2016   Procedure: INSERTION OF ARTERIOVENOUS GORE-TEX GRAFT LEFT UPPER  ARM USING A 4-7MM BY 45CM GRAFT ;  Surgeon: Oris Krystal FALCON, MD;  Location: [facility] OR;  Service: Vascular;  Laterality: Left;   AV FISTULA PLACEMENT Right 07/03/2020   Procedure: INSERTION OF ARTERIOVENOUS (AV) GORE-TEX GRAFT RIGHT UPPER ARM;  Surgeon: Gretta Lonni PARAS, MD;  Location: [facility] OR;   Service: Vascular;  Laterality: Right;   AV FISTULA PLACEMENT Left 07/02/2021   Procedure: LEFT UPPER EXTREMITY ARTERIOVENOUS FISTULA CREATION;  Surgeon: Sheree Penne Lonni, MD;  Location: [facility] OR;  Service: Vascular;  Laterality: Left;   AV FISTULA PLACEMENT Right 05/12/2023   Procedure: RIGHT UPPER EXTREMITY ARTERIOVENOUS (AV) FISTULA CREATION;  Surgeon: Sheree Penne Lonni, MD;  Location: [facility] OR;  Service: Vascular;  Laterality: Right;   BASCILIC VEIN TRANSPOSITION Right 05/06/2020   Procedure: FIRST STAGE RIGHT BASCILIC VEIN TRANSPOSITION;  Surgeon: Paul Gaile ORN, MD;  [facility];  Service: Vascular;  Laterality: Right;   BASCILIC VEIN TRANSPOSITION Left 08/20/2021   Procedure: LEFT ARM SECOND STAGE WITH ARTERIOVENOUS GRAFT;  Surgeon: Sheree Penne Lonni, MD;  Location: [facility] OR;  Service: Vascular;  Laterality: Left;   DIALYSIS/PERMA CATHETER INSERTION N/A 06/21/2023   Procedure: DIALYSIS/PERMA CATHETER INSERTION;  Surgeon: Paul Lynwood ORN, MD;  [facility] INVAS[facility] CV [facility];  Service: Cardiovascular;  Laterality: N/A;   DIALYSIS/PERMA CATHETER REMOVAL N/A 06/21/2023   Procedure: DIALYSIS/PERMA CATHETER REMOVAL;  Surgeon: Paul Lynwood ORN, MD;  Location: [facility] INVASIVE CV LAB;  Service: Cardiovascular;  Laterality: N/A;   EXCHANGE OF A DIALYSIS CATHETER Right 10/08/2016   Procedure: EXCHANGE OF A DIALYSIS CATHETER-RIGHT INTERNAL JUGULAR;  Surgeon: Laurence Redell CROME, MD;  Location: [facility] OR;  Service: Vascular;  Laterality: Right;  INSERTION OF DIALYSIS CATHETER Right 09/26/2016   Procedure: INSERTION OF DIALYSIS CATHETER - Right Internal Jugular Placement;  Surgeon: Harvey Carlin BRAVO, MD;  [facility];  Service: Vascular;  Laterality: Right;   INSERTION OF DIALYSIS CATHETER Left 03/22/2018   Procedure: INSERTION OF DIALYSIS CATHETER;  Surgeon: Gretta Lonni PARAS, MD;  [facility];  Service: Vascular;  Laterality: Left;   INTRAOPERATIVE ARTERIOGRAM Left 05/04/2022   Procedure: INTRA  OPERATIVE ARTERIOGRAM;  Surgeon: Paul Fonda BRAVO, MD;  Location: [facility] OR;  Service: Vascular;  Laterality: Left;   IR AV DIALY SHUNT INTRO NEEDLE/INTRAC INITIAL W/PTA/STENT/IMG LT Left 02/17/2023   IR AV DIALY SHUNT INTRO NEEDLE/INTRACATH INITIAL W/PTA/IMG LEFT  08/19/2022   IR AV DIALY SHUNT INTRO NEEDLE/INTRACATH INITIAL W/PTA/IMG LEFT  11/14/2022   IR DIALY SHUNT INTRO NEEDLE/INTRACATH INITIAL W/IMG LEFT Left 04/15/2022   IR FLUORO GUIDE CV LINE LEFT  04/19/2022   IR FLUORO GUIDE CV LINE LEFT  05/30/2022   IR FLUORO GUIDE CV LINE LEFT  03/24/2023   IR FLUORO GUIDE CV LINE RIGHT  03/26/2021   IR FLUORO GUIDE CV LINE RIGHT  05/13/2021   IR FLUORO GUIDE CV LINE RIGHT  05/04/2022   IR FLUORO GUIDE CV LINE RIGHT  02/17/2023   IR REMOVAL TUN CV CATH W/O FL  05/11/2021   IR REMOVAL TUN CV CATH W/O FL  10/06/2021   IR THROMBECTOMY AV FISTULA W/THROMBOLYSIS/PTA INC/SHUNT/IMG LEFT Left 02/02/2022   IR THROMBECTOMY AV FISTULA W/THROMBOLYSIS/PTA INC/SHUNT/IMG LEFT Left 03/22/2022   IR THROMBECTOMY AV FISTULA W/THROMBOLYSIS/PTA INC/SHUNT/IMG LEFT Left 05/04/2022   IR THROMBECTOMY AV FISTULA W/THROMBOLYSIS/PTA INC/SHUNT/IMG LEFT Left 12/09/2022   IR THROMBECTOMY AV FISTULA W/THROMBOLYSIS/PTA/STENT INC/SHUNT/IMG LT Left 04/27/2022   IR US  GUIDE VASC ACCESS LEFT  02/02/2022   IR US  GUIDE VASC ACCESS LEFT  03/22/2022   IR US  GUIDE VASC ACCESS LEFT  04/15/2022   IR US  GUIDE VASC ACCESS LEFT  04/19/2022   IR US  GUIDE VASC ACCESS LEFT  04/27/2022   IR US  GUIDE VASC ACCESS LEFT  12/13/2022   IR US  GUIDE VASC ACCESS LEFT  02/21/2023   IR US  GUIDE VASC ACCESS RIGHT  03/26/2021   IR US  GUIDE VASC ACCESS RIGHT  05/13/2021   IR US  GUIDE VASC ACCESS RIGHT  05/04/2022   IR US  GUIDE VASC ACCESS RIGHT  02/17/2023   PERIPHERAL VASCULAR BALLOON ANGIOPLASTY  06/21/2023   Procedure: PERIPHERAL VASCULAR BALLOON ANGIOPLASTY;  Surgeon: Paul Lynwood ORN, MD;  [facility]: [facility] INVASIVE CV LAB;  Service: Cardiovascular;;  Innominate Vein   REVISON  OF ARTERIOVENOUS FISTULA Left 03/22/2018   Procedure: LEFT UPPER EXTREMITY ARTERIOVENOUS  REVISION WITH GORE-TEX GRAFT.;  Surgeon: Gretta Lonni PARAS, MD;  Location: [facility] OR;  Service: Vascular;  Laterality: Left;   TEE WITHOUT CARDIOVERSION N/A 05/12/2021   Procedure: TRANSESOPHAGEAL ECHOCARDIOGRAM (TEE);  Surgeon: Santo Stanly LABOR, MD;  Location: [facility] ENDOSCOPY;  Service: Cardiovascular;  Laterality: N/A;   THROMBECTOMY Left 08/26/2019   thrombectomy of LUA loop AVG   THROMBECTOMY AND REVISION OF ARTERIOVENTOUS (AV) GORETEX  GRAFT Right 07/27/2020   Procedure: INSERTION OF RIGHT ARM LOOP GRAFT WITH EXCISION OF RIGHT ARM BRACHIAL AXILLARY GRAFT;  Surgeon: Gretta Lonni PARAS, MD;  Location[facility] OR;  Service: Vascular;  Laterality: Right;   THROMBECTOMY BRACHIAL ARTERY Left 05/04/2022   Procedure: LEFT ARM RADIAL BRACHIAL ARTERY EMBOLECTOMY;  Surgeon: Paul Fonda BRAVO, MD;  Location: [facility] OR;  Service: Vascular;  Laterality: Left;   UPPER EXTREMITY VENOGRAPHY Bilateral 06/14/2021  Procedure: UPPER EXTREMITY VENOGRAPHY;  Surgeon: Sheree Penne Bruckner, MD;  Location: [facility] INVASIVE CV LAB;  Service: Cardiovascular;  Laterality: Bilateral;   UPPER EXTREMITY VENOGRAPHY N/A 04/24/2023   Procedure: UPPER EXTREMITY VENOGRAPHY;  Surgeon: Sheree Penne Bruckner, MD;  Location: [facility] INVASIVE CV LAB;  Service: Cardiovascular;  Laterality: N/A;   VISCERAL ANGIOGRAPHY Right 06/17/2020   Procedure: CENTRAL VENO;  Surgeon: Magda Debby SAILOR, MD;  Location: [facility] INVASIVE CV LAB;  Service: Cardiovascular;  Laterality: Right;    Social History   Socioeconomic History   Marital status: Single    Spouse name: Not on file   Number of children: Not on file   Years of education: Not on file   Highest education level: Not on file  Occupational History   Not on file  Tobacco Use   Smoking status: Never   Smokeless tobacco: Never  Vaping Use   Vaping status: Never Used  Substance and Sexual Activity   Alcohol  use: No   Drug use: Yes    Frequency: 2.0 times per week    Types: Marijuana    Comment: 1-2 times day   Sexual activity: Yes  Other Topics Concern   Not on file  Social History Narrative   Not on file   Social Drivers of Health   Financial Resource Strain: Not on file  Food Insecurity: No Food Insecurity (09/29/2022)   Hunger Vital Sign    Worried About Running Out of Food in the Last Year: Never true    Ran Out of Food in the Last Year: Never true  Transportation Needs: No Transportation Needs (09/29/2022)   PRAPARE - Administrator, Civil Service (Medical): No    Lack of Transportation (Non-Medical): No  Physical Activity: Insufficiently Active (07/07/2021)   Exercise Vital Sign    Days of Exercise per Week: 4 days    Minutes of Exercise per Session: 30 min  Stress: Stress Concern Present (08/06/2021)   [employer] of Occupational Health - Occupational Stress Questionnaire    Feeling of Stress : To some extent  Social Connections: Socially Isolated (06/07/2021)   Social Connection and Isolation Panel [NHANES]    Frequency of Communication with Friends and Family: More than three times a week    Frequency of Social Gatherings with Friends and Family: More than three times a week    Attends Religious Services: Never    Database Administrator or Organizations: No    Attends Banker Meetings: Never    Marital Status: Never married  Intimate Partner Violence: Not At Risk (09/29/2022)   Humiliation, Afraid, Rape, and Kick questionnaire    Fear of Current or Ex-Partner: No    Emotionally Abused: No    Physically Abused: No    Sexually Abused: No    Family History  Problem Relation Age of Onset   Heart disease Mother    Hypertension Mother     Current Outpatient Medications  Medication Sig Dispense Refill   ACCU-CHEK GUIDE test strip USE AS DIRECTED TWICE DAILY 100 each 0   Accu-Chek Softclix Lancets lancets Use as instructed 100 each 12    aspirin  EC 81 MG tablet Take 1 tablet (81 mg total) by mouth daily. Swallow whole. 90 tablet 0   Blood Glucose Monitoring Suppl (ACCU-CHEK GUIDE ME) w/Device KIT 1 kit by Does not apply route daily. 1 kit 0   ELIQUIS  5 MG TABS tablet Take 1 tablet by mouth twice daily 60 tablet 0  gabapentin  (NEURONTIN ) 100 MG capsule TAKE 1 CAPSULE BY MOUTH THREE TIMES DAILY 90 capsule 0   LANTUS  100 UNIT/ML injection INJECT 5 UNITS SUBCUTANEOUSLY TWICE DAILY 10 mL 0   levothyroxine  (SYNTHROID ) 150 MCG tablet TAKE 1 TABLET BY MOUTH ONCE DAILY BEFORE BREAKFAST *APPOINTMENT NEEDED FOR FURTHER REFILLS* 15 tablet 0   multivitamin (RENA-VIT) TABS tablet Take 1 tablet by mouth at bedtime. 30 tablet 0   oxyCODONE  (OXY IR/ROXICODONE ) 5 MG immediate release tablet Take 1 tablet (5 mg total) by mouth every 6 (six) hours as needed for moderate pain (pain score 4-6) (pain score 4-6). 15 tablet 0   rosuvastatin  (CRESTOR ) 20 MG tablet Take 1 tablet by mouth once daily 90 tablet 0   Syringe, Disposable, (B-D SYRINGE SLIP TIP 30CC) 30 ML MISC 1 Syringe by Does not apply route daily. 30 each 11   ascorbic acid  (VITAMIN C ) 1000 MG tablet Take 1 tablet (1,000 mg total) by mouth daily. (Patient not taking: Reported on 05/09/2023) 30 tablet 0   calcium  carbonate (TUMS - DOSED IN MG ELEMENTAL CALCIUM ) 500 MG chewable tablet Chew 1,000 mg by mouth daily as needed for indigestion or heartburn. (Patient not taking: Reported on 05/09/2023)     cetirizine  (ZYRTEC ) 10 MG tablet Take 1 tablet (10 mg total) by mouth daily as needed for allergies. (Patient not taking: Reported on 05/09/2023) 30 tablet 0   ferric citrate  (AURYXIA ) 1 GM 210 MG(Fe) tablet Take 2-4 tablets (420-840 mg total) by mouth See admin instructions. Take 4 tablets (840 mg) by mouth with each meal & take 2 tablets (420 mg) by mouth with each snack. (Patient not taking: Reported on 05/09/2023) 270 tablet 0   gabapentin  (NEURONTIN ) 300 MG capsule Take 1 capsule by mouth at  bedtime (Patient not taking: Reported on 05/09/2023) 30 capsule 0   midodrine  (PROAMATINE ) 5 MG tablet Take 1 tablet (5 mg total) by mouth Every Tuesday,Thursday,and Saturday with dialysis. (Patient not taking: Reported on 05/09/2023) 30 tablet 3   polyethylene glycol (MIRALAX  / GLYCOLAX ) 17 g packet Take 17 g by mouth daily as needed for mild constipation. (Patient not taking: Reported on 05/09/2023) 14 each 0   zinc  sulfate 220 (50 Zn) MG capsule Take 1 capsule (220 mg total) by mouth daily. (Patient not taking: Reported on 05/09/2023) 14 capsule 0   No current facility-administered medications for this visit.    Allergies  Allergen Reactions   Hydralazine  Hcl Other (See Comments)    DRUG-INDUCED LUPUS     REVIEW OF SYSTEMS:   [X]  denotes positive finding, [ ]  denotes negative finding Cardiac  Comments:  Chest pain or chest pressure:    Shortness of breath upon exertion:    Short of breath when lying flat:    Irregular heart rhythm:        Vascular    Pain in calf, thigh, or hip brought on by ambulation:    Pain in feet at night that wakes you up from your sleep:     Blood clot in your veins:    Leg swelling:         Pulmonary    Oxygen at home:    Productive cough:     Wheezing:         Neurologic    Sudden weakness in arms or legs:     Sudden numbness in arms or legs:     Sudden onset of difficulty speaking or slurred speech:    Temporary loss of vision in  one eye:     Problems with dizziness:         Gastrointestinal    Blood in stool:     Vomited blood:         Genitourinary    Burning when urinating:     Blood in urine:        Psychiatric    Major depression:         Hematologic    Bleeding problems:    Problems with blood clotting too easily:        Skin    Rashes or ulcers:        Constitutional    Fever or chills:      PHYSICAL EXAMINATION:  Vitals:   06/28/23 1350  BP: 129/86  Pulse: 98  Resp: 20  Temp: 97.8 F (36.6 C)  TempSrc:  Temporal  SpO2: 98%  Weight: (!) 354 lb 8 oz (160.8 kg)  Height: 6' 2 (1.88 m)    General:  WDWN in NAD; vital signs documented above Gait: Not observed HENT: WNL, normocephalic Pulmonary: normal non-labored breathing , without Rales, rhonchi,  wheezing Cardiac: regular HR Abdomen: soft, NT, no masses Skin: without rashes Vascular Exam/Pulses: Palpable right radial pulse; soft thrill near the anastomosis in the right arm Extremities: without ischemic changes, without Gangrene , without cellulitis; without open wounds;  Musculoskeletal: no muscle wasting or atrophy  Neurologic: A&O X 3 Psychiatric:  The pt has Normal affect.   Non-Invasive Vascular Imaging:   Patent basilic vein fistula in the right arm about 4 mm in diameter    ASSESSMENT/PLAN:: 33 y.o. male status post right basilic vein fistula creation requiring brachial artery endarterectomy  Right hand is well-perfused with a palpable radial pulse and no signs or symptoms of steal syndrome.  The fistula has a soft thrill near the anastomosis on exam.  By duplex the diameter of the fistula is around 4 mm.  Imaging was reviewed with Dr. Sheree.  We will proceed with second stage basilic vein transposition.  Patient may require conversion to Artegraft if the basilic vein does not appear healthy or mature enough intraoperatively.  Continue HD via right IJ TDC.  Case was discussed in detail with the patient and he is agreeable to proceed.   Donnice Sender, PA-C [facility] [phone]  Clinic MD:   Sheree

## 2023-06-28 NOTE — Progress Notes (Signed)
 Post-Op Visit Note   Patient: Paul [name]           Date of Birth: April 19, 1991           MRN: [id number] Visit Date: 06/28/2023 PCP: Dartha Geralds, DO  Chief Complaint:  Chief Complaint  Patient presents with   Left Foot - Wound Check, Follow-up    HPI:  HPI The patient is a 33 year old gentleman seen status post left first ray amputation this has been slow to heal.  He has buildup of hypertrophic callus today, has been 4 weeks since last debrided Ortho Exam Wound is stable.  On examination of the left foot the first ray amputation continues to gradually improve.  This was debrided with a 10 blade knife of hyperkeratotic tissue back to viable tissue there is one remaining open area proximally this is 1 cm in diameter with 5 mm of depth. there is necrotic tissue. does not probe to bone no sign of infection  Visit Diagnoses:  No diagnosis found.   Plan: Continue daily Dial  soap cleansing.  May continue dry dressing changes. will follow-up in 2 weeks.  Follow-Up Instructions: Return in about 3 weeks (around 07/19/2023).   Imaging: No results found.  Orders:  No orders of the defined types were placed in this encounter.  No orders of the defined types were placed in this encounter.    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   Thrombus 05/04/2022   Ischemia of left upper extremity 05/04/2022   Complication of vascular access for dialysis 04/19/2022   ESRD (end stage renal disease) (HCC) 04/19/2022   Failure of hemodialysis access (HCC) 04/18/2022   Left hand pain 09/22/2021   Diabetic foot infection (HCC)    Wound infection 05/07/2021   MSSA bacteremia 05/07/2021   Osteomyelitis (HCC) 05/07/2021   Abscess 05/07/2021   Sepsis due to cellulitis (HCC) 05/06/2021   Poor compliance with medication 03/17/2021    Fluid overload, unspecified 07/09/2020   Complication of vascular dialysis catheter 03/23/2020   Other disorders of phosphorus metabolism 01/28/2020   Hypertension 01/08/2020   Hypocalcemia 10/29/2019   ESRD on dialysis (HCC) 11/16/2017   Pruritus, unspecified 04/04/2017   Unspecified protein-calorie malnutrition (HCC) 11/11/2016   Headache, unspecified 10/29/2016   Encounter for immunization 10/19/2016   Iron  deficiency anemia, unspecified 10/10/2016   Anemia in chronic kidney disease 09/30/2016   Coagulation defect, unspecified (HCC) 09/30/2016   Pneumonia due to Pseudomonas (HCC) 09/30/2016   Secondary hyperparathyroidism of renal origin (HCC) 09/30/2016   Hypothyroidism 10/29/2009   Type 2 diabetes mellitus (HCC) 04/14/2009   Hyperlipidemia 04/14/2009   OBESITY, MORBID 04/14/2009   Essential hypertension, benign 04/14/2009   Past Medical History:  Diagnosis Date   Acute hypoxemic respiratory failure (HCC) 09/11/2016   Anemia    Chronic kidney disease    ARF on CRF Dialysis T/TH/Sa   Diabetes mellitus    Type II   End stage renal disease on dialysis (HCC)    [place]    GERD (gastroesophageal reflux disease)    diet controlled   HLD (hyperlipidemia)    Hypertension    Hypothyroidism    Morbid obesity (HCC)    Sacral wound    resolved   Thyroid  disease     Family History  Problem Relation Age of Onset   Heart disease Mother  Hypertension Mother     Past Surgical History:  Procedure Laterality Date   AMPUTATION Left 05/08/2021   Procedure: AMPUTATION GREAT TOE;  Surgeon: Harden Jerona GAILS, MD;  Location: [facility] OR;  Service: Orthopedics;  Laterality: Left;   AMPUTATION Left 10/01/2022   Procedure: LEFT FOOT AMPUTATION RAY 1st;  Surgeon: Harden Jerona GAILS, MD;  Location: [facility] OR;  Service: Orthopedics;  Laterality: Left;   AV FISTULA PLACEMENT Left 09/26/2016   Procedure: LEFT UPPER ARM ARTERIOVENOUS (AV) FISTULA CREATION;  Surgeon: Harvey Carlin BRAVO, MD;  Location: [facility]  OR;  Service: Vascular;  Laterality: Left;   AV FISTULA PLACEMENT Left 12/02/2016   Procedure: INSERTION OF ARTERIOVENOUS GORE-TEX GRAFT LEFT UPPER  ARM USING A 4-7MM BY 45CM GRAFT ;  Surgeon: Oris Krystal FALCON, MD;  Location: [facility] OR;  Service: Vascular;  Laterality: Left;   AV FISTULA PLACEMENT Right 07/03/2020   Procedure: INSERTION OF ARTERIOVENOUS (AV) GORE-TEX GRAFT RIGHT UPPER ARM;  Surgeon: Gretta Lonni PARAS, MD;  Location: [facility] OR;  Service: Vascular;  Laterality: Right;   AV FISTULA PLACEMENT Left 07/02/2021   Procedure: LEFT UPPER EXTREMITY ARTERIOVENOUS FISTULA CREATION;  Surgeon: Sheree Penne Lonni, MD;  Location: [facility] OR;  Service: Vascular;  Laterality: Left;   AV FISTULA PLACEMENT Right 05/12/2023   Procedure: RIGHT UPPER EXTREMITY ARTERIOVENOUS (AV) FISTULA CREATION;  Surgeon: Sheree Penne Lonni, MD;  Location: [facility] OR;  Service: Vascular;  Laterality: Right;   BASCILIC VEIN TRANSPOSITION Right 05/06/2020   Procedure: FIRST STAGE RIGHT BASCILIC VEIN TRANSPOSITION;  Surgeon: Serene Gaile ORN, MD;  Location: [facility] OR;  Service: Vascular;  Laterality: Right;   BASCILIC VEIN TRANSPOSITION Left 08/20/2021   Procedure: LEFT ARM SECOND STAGE WITH ARTERIOVENOUS GRAFT;  Surgeon: Sheree Penne Lonni, MD;  Location: [facility] OR;  Service: Vascular;  Laterality: Left;   DIALYSIS/PERMA CATHETER INSERTION N/A 06/21/2023   Procedure: DIALYSIS/PERMA CATHETER INSERTION;  Surgeon: Melia Lynwood ORN, MD;  Location: [facility] INVASIVE CV LAB;  Service: Cardiovascular;  Laterality: N/A;   DIALYSIS/PERMA CATHETER REMOVAL N/A 06/21/2023   Procedure: DIALYSIS/PERMA CATHETER REMOVAL;  Surgeon: Melia Lynwood ORN, MD;  Location: [facility] INVASIVE CV LAB;  Service: Cardiovascular;  Laterality: N/A;   EXCHANGE OF A DIALYSIS CATHETER Right 10/08/2016   Procedure: EXCHANGE OF A DIALYSIS CATHETER-RIGHT INTERNAL JUGULAR;  Surgeon: Laurence Redell CROME, MD;  Location: [facility] OR;  Service: Vascular;  Laterality: Right;   INSERTION OF DIALYSIS CATHETER  Right 09/26/2016   Procedure: INSERTION OF DIALYSIS CATHETER - Right Internal Jugular Placement;  Surgeon: Harvey Carlin BRAVO, MD;  Location: [facility] OR;  Service: Vascular;  Laterality: Right;   INSERTION OF DIALYSIS CATHETER Left 03/22/2018   Procedure: INSERTION OF DIALYSIS CATHETER;  Surgeon: Gretta Lonni PARAS, MD;  Location: [facility] OR;  Service: Vascular;  Laterality: Left;   INTRAOPERATIVE ARTERIOGRAM Left 05/04/2022   Procedure: INTRA OPERATIVE ARTERIOGRAM;  Surgeon: Lanis Fonda BRAVO, MD;  Location: [facility] OR;  Service: Vascular;  Laterality: Left;   IR AV DIALY SHUNT INTRO NEEDLE/INTRAC INITIAL W/PTA/STENT/IMG LT Left 02/17/2023   IR AV DIALY SHUNT INTRO NEEDLE/INTRACATH INITIAL W/PTA/IMG LEFT  08/19/2022   IR AV DIALY SHUNT INTRO NEEDLE/INTRACATH INITIAL W/PTA/IMG LEFT  11/14/2022   IR DIALY SHUNT INTRO NEEDLE/INTRACATH INITIAL W/IMG LEFT Left 04/15/2022   IR FLUORO GUIDE CV LINE LEFT  04/19/2022   IR FLUORO GUIDE CV LINE LEFT  05/30/2022   IR FLUORO GUIDE CV LINE LEFT  03/24/2023   IR FLUORO GUIDE CV LINE RIGHT  03/26/2021   IR FLUORO GUIDE CV  LINE RIGHT  05/13/2021   IR FLUORO GUIDE CV LINE RIGHT  05/04/2022   IR FLUORO GUIDE CV LINE RIGHT  02/17/2023   IR REMOVAL TUN CV CATH W/O FL  05/11/2021   IR REMOVAL TUN CV CATH W/O FL  10/06/2021   IR THROMBECTOMY AV FISTULA W/THROMBOLYSIS/PTA INC/SHUNT/IMG LEFT Left 02/02/2022   IR THROMBECTOMY AV FISTULA W/THROMBOLYSIS/PTA INC/SHUNT/IMG LEFT Left 03/22/2022   IR THROMBECTOMY AV FISTULA W/THROMBOLYSIS/PTA INC/SHUNT/IMG LEFT Left 05/04/2022   IR THROMBECTOMY AV FISTULA W/THROMBOLYSIS/PTA INC/SHUNT/IMG LEFT Left 12/09/2022   IR THROMBECTOMY AV FISTULA W/THROMBOLYSIS/PTA/STENT INC/SHUNT/IMG LT Left 04/27/2022   IR US  GUIDE VASC ACCESS LEFT  02/02/2022   IR US  GUIDE VASC ACCESS LEFT  03/22/2022   IR US  GUIDE VASC ACCESS LEFT  04/15/2022   IR US  GUIDE VASC ACCESS LEFT  04/19/2022   IR US  GUIDE VASC ACCESS LEFT  04/27/2022   IR US  GUIDE VASC ACCESS LEFT  12/13/2022    IR US  GUIDE VASC ACCESS LEFT  02/21/2023   IR US  GUIDE VASC ACCESS RIGHT  03/26/2021   IR US  GUIDE VASC ACCESS RIGHT  05/13/2021   IR US  GUIDE VASC ACCESS RIGHT  05/04/2022   IR US  GUIDE VASC ACCESS RIGHT  02/17/2023   PERIPHERAL VASCULAR BALLOON ANGIOPLASTY  06/21/2023   Procedure: PERIPHERAL VASCULAR BALLOON ANGIOPLASTY;  Surgeon: Melia Lynwood ORN, MD;  Location: [facility] INVASIVE CV LAB;  Service: Cardiovascular;;  Innominate Vein   REVISON OF ARTERIOVENOUS FISTULA Left 03/22/2018   Procedure: LEFT UPPER EXTREMITY ARTERIOVENOUS  REVISION WITH GORE-TEX GRAFT.;  Surgeon: Gretta Lonni PARAS, MD;  Location: [facility] OR;  Service: Vascular;  Laterality: Left;   TEE WITHOUT CARDIOVERSION N/A 05/12/2021   Procedure: TRANSESOPHAGEAL ECHOCARDIOGRAM (TEE);  Surgeon: Santo Stanly LABOR, MD;  Location: [facility] ENDOSCOPY;  Service: Cardiovascular;  Laterality: N/A;   THROMBECTOMY Left 08/26/2019   thrombectomy of LUA loop AVG   THROMBECTOMY AND REVISION OF ARTERIOVENTOUS (AV) GORETEX  GRAFT Right 07/27/2020   Procedure: INSERTION OF RIGHT ARM LOOP GRAFT WITH EXCISION OF RIGHT ARM BRACHIAL AXILLARY GRAFT;  Surgeon: Gretta Lonni PARAS, MD;  Location: [facility] OR;  Service: Vascular;  Laterality: Right;   THROMBECTOMY BRACHIAL ARTERY Left 05/04/2022   Procedure: LEFT ARM RADIAL BRACHIAL ARTERY EMBOLECTOMY;  Surgeon: Lanis Fonda BRAVO, MD;  Location: [facility] OR;  Service: Vascular;  Laterality: Left;   UPPER EXTREMITY VENOGRAPHY Bilateral 06/14/2021   Procedure: UPPER EXTREMITY VENOGRAPHY;  Surgeon: Sheree Penne Lonni, MD;  Location: [facility] INVASIVE CV LAB;  Service: Cardiovascular;  Laterality: Bilateral;   UPPER EXTREMITY VENOGRAPHY N/A 04/24/2023   Procedure: UPPER EXTREMITY VENOGRAPHY;  Surgeon: Sheree Penne Lonni, MD;  Location: [facility] INVASIVE CV LAB;  Service: Cardiovascular;  Laterality: N/A;   VISCERAL ANGIOGRAPHY Right 06/17/2020   Procedure: CENTRAL VENO;  Surgeon: Magda Debby SAILOR, MD;  Location: [facility] INVASIVE CV LAB;   Service: Cardiovascular;  Laterality: Right;   Social History   Occupational History   Not on file  Tobacco Use   Smoking status: Never   Smokeless tobacco: Never  Vaping Use   Vaping status: Never Used  Substance and Sexual Activity   Alcohol use: No   Drug use: Yes    Frequency: 2.0 times per week    Types: Marijuana    Comment: 1-2 times day   Sexual activity: Yes

## 2023-06-29 ENCOUNTER — Encounter: Payer: Self-pay | Admitting: *Deleted

## 2023-06-29 ENCOUNTER — Other Ambulatory Visit: Payer: Self-pay | Admitting: *Deleted

## 2023-06-29 DIAGNOSIS — N186 End stage renal disease: Secondary | ICD-10-CM

## 2023-06-30 ENCOUNTER — Encounter: Payer: Self-pay | Admitting: *Deleted

## 2023-07-03 ENCOUNTER — Other Ambulatory Visit: Payer: Self-pay | Admitting: Student

## 2023-07-03 DIAGNOSIS — E785 Hyperlipidemia, unspecified: Secondary | ICD-10-CM

## 2023-07-03 DIAGNOSIS — E039 Hypothyroidism, unspecified: Secondary | ICD-10-CM

## 2023-07-12 ENCOUNTER — Other Ambulatory Visit: Payer: Self-pay | Admitting: Student

## 2023-07-19 ENCOUNTER — Ambulatory Visit: Payer: Medicaid Other | Admitting: Family

## 2023-07-19 ENCOUNTER — Encounter (HOSPITAL_COMMUNITY): Payer: Self-pay | Admitting: Vascular Surgery

## 2023-07-19 ENCOUNTER — Other Ambulatory Visit: Payer: Self-pay

## 2023-07-19 NOTE — Progress Notes (Signed)
 PCP - Darral Dash, DO Cardiologist -   PPM/ICD - denies Device Orders - n/a Rep Notified - n/a  Chest x-ray - 07-30-22 EKG - 09-29-22 Stress Test - 03-06-20  (CE) ECHO - 05-09-21 Cardiac Cath -   CPAP - denies  GLP-1 - denies  Fasting Blood Sugar - Per patient between 130-140 Checks Blood Sugar BID  Blood Thinner Instructions: Eliquis Last dose 07-18-23 Aspirin Instructions: per patient he is to continue  ERAS Protcol - NPO  COVID TEST- n/a  Anesthesia review: yes  Patient verbally denies any shortness of breath, fever, cough and chest pain during phone call   -------------  SDW INSTRUCTIONS given:  Your procedure is scheduled on July 21, 2023.  Report to [facility] Main Entrance "A" at 6:45 A.M., and check in at the Admitting office.  Call this number if you have problems the morning of surgery:  [phone]   Remember:  Do not eat or drink  after midnight the night before your surgery      Take these medicines the morning of surgery with A SIP OF WATER  gabapentin (NEURONTIN)  levothyroxine (SYNTHROID)  rosuvastatin (CRESTOR)   As of today, STOP taking any Aspirin (unless otherwise instructed by your surgeon) Aleve, Naproxen, Ibuprofen, Motrin, Advil, Goody's, BC's, all herbal medications, fish oil, and all vitamins. WHAT DO I DO ABOUT MY DIABETES MEDICATION?   Do not take oral diabetes medicines (pills) the morning of surgery.  THE NIGHT BEFORE SURGERY, take 2 units of LANTUS insulin.       THE MORNING OF SURGERY, take 2 units of LANTUS insulin.  The day of surgery, do not take other diabetes injectables, including Byetta (exenatide), Bydureon (exenatide ER), Victoza (liraglutide), or Trulicity (dulaglutide).  If your CBG is greater than 220 mg/dL, you may take  of your sliding scale (correction) dose of insulin.   HOW TO MANAGE YOUR DIABETES BEFORE AND AFTER SURGERY  Why is it important to control my blood sugar before and after  surgery? Improving blood sugar levels before and after surgery helps healing and can limit problems. A way of improving blood sugar control is eating a healthy diet by:  Eating less sugar and carbohydrates  Increasing activity/exercise  Talking with your doctor about reaching your blood sugar goals High blood sugars (greater than 180 mg/dL) can raise your risk of infections and slow your recovery, so you will need to focus on controlling your diabetes during the weeks before surgery. Make sure that the doctor who takes care of your diabetes knows about your planned surgery including the date and location.  How do I manage my blood sugar before surgery? Check your blood sugar at least 4 times a day, starting 2 days before surgery, to make sure that the level is not too high or low.  Check your blood sugar the morning of your surgery when you wake up and every 2 hours until you get to the Short Stay unit.  If your blood sugar is less than 70 mg/dL, you will need to treat for low blood sugar: Do not take insulin. Treat a low blood sugar (less than 70 mg/dL) with  cup of clear juice (cranberry or apple), 4 glucose tablets, OR glucose gel. Recheck blood sugar in 15 minutes after treatment (to make sure it is greater than 70 mg/dL). If your blood sugar is not greater than 70 mg/dL on recheck, call [phone] for further instructions. Report your blood sugar to the short stay nurse when you  get to Short Stay.  If you are admitted to the hospital after surgery: Your blood sugar will be checked by the staff and you will probably be given insulin after surgery (instead of oral diabetes medicines) to make sure you have good blood sugar levels. The goal for blood sugar control after surgery is 80-180 mg/dL.                      Do not wear jewelry, make up, or nail polish            Do not wear lotions, powders, perfumes/colognes, or deodorant.            Do not shave 48 hours prior to surgery.   Men may shave face and neck.            Do not bring valuables to the hospital.            [facility] is not responsible for any belongings or valuables.  Do NOT Smoke (Tobacco/Vaping) 24 hours prior to your procedure If you use a CPAP at night, you may bring all equipment for your overnight stay.   Contacts, glasses, dentures or bridgework may not be worn into surgery.      For patients admitted to the hospital, discharge time will be determined by your treatment team.   Patients discharged the day of surgery will not be allowed to drive home, and someone needs to stay with them for 24 hours.    Special instructions:   [place]- Preparing For Surgery  Before surgery, you can play an important role. Because skin is not sterile, your skin needs to be as free of germs as possible. You can reduce the number of germs on your skin by washing with CHG (chlorahexidine gluconate) Soap before surgery.  CHG is an antiseptic cleaner which kills germs and bonds with the skin to continue killing germs even after washing.    Oral Hygiene is also important to reduce your risk of infection.  Remember - BRUSH YOUR TEETH THE MORNING OF SURGERY WITH YOUR REGULAR TOOTHPASTE  Please do not use if you have an allergy to CHG or antibacterial soaps. If your skin becomes reddened/irritated stop using the CHG.  Do not shave (including legs and underarms) for at least 48 hours prior to first CHG shower. It is OK to shave your face.  Please follow these instructions carefully.   Shower the NIGHT BEFORE SURGERY and the MORNING OF SURGERY with DIAL Soap.   Pat yourself dry with a CLEAN TOWEL.  Wear CLEAN PAJAMAS to bed the night before surgery  Place CLEAN SHEETS on your bed the night of your first shower and DO NOT SLEEP WITH PETS.   Day of Surgery: Please shower morning of surgery  Wear Clean/Comfortable clothing the morning of surgery Do not apply any deodorants/lotions.   Remember to brush your  teeth WITH YOUR REGULAR TOOTHPASTE.   Questions were answered. Patient verbalized understanding of instructions.

## 2023-07-20 ENCOUNTER — Encounter (HOSPITAL_COMMUNITY): Payer: Self-pay | Admitting: Physician Assistant

## 2023-07-20 NOTE — Progress Notes (Signed)
 Patient was called to be informed that the surgery time for tomorrow was changed to 07:30 o'clock. Patient's mother answered and she verbalized that the patient is sick and he can't have surgery tomorrow. Mother said that she called Dr. Darcella Cheshire office today at 2 PM and told to the nurse that the patient is sick. OR was informed that the patient will not come tomorrow for surgery.

## 2023-07-21 ENCOUNTER — Ambulatory Visit (HOSPITAL_COMMUNITY): Admission: RE | Admit: 2023-07-21 | Payer: Medicaid Other | Source: Home / Self Care | Admitting: Vascular Surgery

## 2023-07-21 DIAGNOSIS — N186 End stage renal disease: Secondary | ICD-10-CM | POA: Diagnosis not present

## 2023-07-21 DIAGNOSIS — Z992 Dependence on renal dialysis: Secondary | ICD-10-CM | POA: Diagnosis not present

## 2023-07-21 DIAGNOSIS — E1129 Type 2 diabetes mellitus with other diabetic kidney complication: Secondary | ICD-10-CM | POA: Diagnosis not present

## 2023-07-21 SURGERY — TRANSPOSITION, VEIN, BASILIC
Anesthesia: Choice | Laterality: Right

## 2023-07-24 ENCOUNTER — Other Ambulatory Visit: Payer: Self-pay | Admitting: Student

## 2023-07-24 ENCOUNTER — Telehealth: Payer: Self-pay

## 2023-07-24 DIAGNOSIS — Z794 Long term (current) use of insulin: Secondary | ICD-10-CM

## 2023-07-24 NOTE — Telephone Encounter (Signed)
 Attempted to call for surgery scheduling. LVM

## 2023-07-26 ENCOUNTER — Ambulatory Visit (INDEPENDENT_AMBULATORY_CARE_PROVIDER_SITE_OTHER): Payer: Medicaid Other | Admitting: Family

## 2023-07-26 ENCOUNTER — Other Ambulatory Visit: Payer: Self-pay

## 2023-07-26 ENCOUNTER — Encounter: Payer: Self-pay | Admitting: Family

## 2023-07-26 ENCOUNTER — Telehealth: Payer: Self-pay

## 2023-07-26 DIAGNOSIS — L089 Local infection of the skin and subcutaneous tissue, unspecified: Secondary | ICD-10-CM | POA: Diagnosis not present

## 2023-07-26 DIAGNOSIS — N186 End stage renal disease: Secondary | ICD-10-CM

## 2023-07-26 DIAGNOSIS — Z89432 Acquired absence of left foot: Secondary | ICD-10-CM

## 2023-07-26 NOTE — Progress Notes (Signed)
 Office Visit Note   Patient: Paul [name]           Date of Birth: [birth year]           MRN: [id number] Visit Date: 07/26/2023              Requested by: Darral Dash, DO [address] [place],  [place] [address] PCP: Darral Dash, DO  No chief complaint on file.     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  Unfortunately this has been slow to heal after an area of dehiscence.  He has been in regular shoewear and is seen routinely to have the hyperkeratotic tissue debrided.  Has been packing open with silver cell has noticed improvement  Assessment & Plan: Visit Diagnoses: No diagnosis found.  Plan: Continue with daily Dial soap cleansing.  Dry dressings.  Follow-Up Instructions: No follow-ups on file.   Ortho Exam  Patient is alert, oriented, no adenopathy, well-dressed, normal affect, normal respiratory effort. On examination left foot the first ray amputation continues to have buildup of hyperkeratotic tissue.  This is debrided with a 10 blade knife back to viable tissue.  The remaining open area is a fissure now 1 cm x 1 mm with 3 mm of depth there is no surrounding erythema or warmth scant bloody drainage.  There is no concerning sign  Imaging: No results found. No images are attached to the encounter.  Labs: Lab Results  Component Value Date   HGBA1C 7.4 (H) 09/30/2022   HGBA1C 6.3 (H) 04/18/2022   HGBA1C 6.2 09/22/2021   REPTSTATUS 10/06/2022 FINAL 10/01/2022   GRAMSTAIN  10/01/2022    FEW WBC PRESENT, PREDOMINANTLY PMN RARE GRAM POSITIVE COCCI RARE SQUAMOUS EPITHELIAL CELLS PRESENT    CULT  10/01/2022    RARE ESCHERICHIA COLI RARE STENOTROPHOMONAS MALTOPHILIA NO ANAEROBES ISOLATED Performed at [facility], [address], [place], [place] [address]    [facility] ESCHERICHIA COLI 10/01/2022   LABORGA STENOTROPHOMONAS MALTOPHILIA 10/01/2022     Lab Results  Component Value  Date   ALBUMIN 3.3 (L) 09/30/2022   ALBUMIN 4.4 09/29/2022   ALBUMIN 3.7 05/05/2022    Lab Results  Component Value Date   MG 2.8 (H) 09/26/2016   MG 2.8 (H) 09/25/2016   MG 2.8 (H) 09/24/2016   Lab Results  Component Value Date   VD25OH 6.8 (L) 09/14/2016    No results found for: "PREALBUMIN"    Latest Ref Rng & Units 05/12/2023    7:32 AM 04/24/2023    9:35 AM 02/17/2023    4:24 PM  CBC EXTENDED  Hemoglobin 13.0 - 17.0 g/dL 82.9  56.2  13.0   HCT 39.0 - 52.0 % 46.0  46.0  38.0      There is no height or weight on file to calculate BMI.  Orders:  No orders of the defined types were placed in this encounter.  No orders of the defined types were placed in this encounter.    Procedures: No procedures performed  Clinical Data: No additional findings.  ROS:  All other systems negative, except as noted in the HPI. Review of Systems  Objective: Vital Signs: There were no vitals taken for this visit.  Specialty Comments:  No specialty comments available.  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   Thrombus 05/04/2022   Ischemia of left upper extremity 05/04/2022   Complication of vascular access for dialysis 04/19/2022   ESRD (end stage renal disease) (HCC) 04/19/2022   Failure of hemodialysis access (HCC) 04/18/2022   Left hand pain 09/22/2021   Diabetic foot infection (HCC)    Wound infection 05/07/2021   MSSA bacteremia 05/07/2021   Osteomyelitis (HCC) 05/07/2021   Abscess 05/07/2021   Sepsis due to cellulitis (HCC) 05/06/2021   Poor compliance with medication 03/17/2021   Fluid overload, unspecified 07/09/2020   Complication of vascular dialysis catheter 03/23/2020   Other disorders of phosphorus metabolism 01/28/2020   Hypertension 01/08/2020   Hypocalcemia 10/29/2019   ESRD on dialysis  (HCC) 11/16/2017   Pruritus, unspecified 04/04/2017   Unspecified protein-calorie malnutrition (HCC) 11/11/2016   Headache, unspecified 10/29/2016   Encounter for immunization 10/19/2016   Iron deficiency anemia, unspecified 10/10/2016   Anemia in chronic kidney disease 09/30/2016   Coagulation defect, unspecified (HCC) 09/30/2016   Pneumonia due to Pseudomonas (HCC) 09/30/2016   Secondary hyperparathyroidism of renal origin (HCC) 09/30/2016   Hypothyroidism 10/29/2009   Type 2 diabetes mellitus (HCC) 04/14/2009   Hyperlipidemia 04/14/2009   OBESITY, MORBID 04/14/2009   Essential hypertension, benign 04/14/2009   Past Medical History:  Diagnosis Date   Acute hypoxemic respiratory failure (HCC) 09/11/2016   Anemia    Chronic kidney disease    ARF on CRF Dialysis T/TH/Sa   Diabetes mellitus    Type II   End stage renal disease on dialysis (HCC)    [place]    GERD (gastroesophageal reflux disease)    diet controlled   HLD (hyperlipidemia)    Hypertension    Hypothyroidism    Morbid obesity (HCC)    Sacral wound    resolved   Thyroid disease     Family History  Problem Relation Age of Onset   Heart disease Mother    Hypertension Mother     Past Surgical History:  Procedure Laterality Date   AMPUTATION Left 05/08/2021   Procedure: AMPUTATION GREAT TOE;  Surgeon: Nadara Mustard, MD;  Location: [facility] OR;  Service: Orthopedics;  Laterality: Left;   AMPUTATION Left 10/01/2022   Procedure: LEFT FOOT AMPUTATION RAY 1st;  Surgeon: Nadara Mustard, MD;  Location: [facility] OR;  Service: Orthopedics;  Laterality: Left;   AV FISTULA PLACEMENT Left 09/26/2016   Procedure: LEFT UPPER ARM ARTERIOVENOUS (AV) FISTULA CREATION;  Surgeon: Sherren Kerns, MD;  Location: [facility] OR;  Service: Vascular;  Laterality: Left;   AV FISTULA PLACEMENT Left 12/02/2016   Procedure: INSERTION OF ARTERIOVENOUS GORE-TEX GRAFT LEFT UPPER  ARM USING A 4-7MM BY 45CM GRAFT ;  Surgeon: Larina Earthly, MD;  Location: [facility]  OR;  Service: Vascular;  Laterality: Left;   AV FISTULA PLACEMENT Right 07/03/2020   Procedure: INSERTION OF ARTERIOVENOUS (AV) GORE-TEX GRAFT RIGHT UPPER ARM;  Surgeon: Cephus Shelling, MD;  Location: [facility] OR;  Service: Vascular;  Laterality: Right;   AV FISTULA PLACEMENT Left 07/02/2021   Procedure: LEFT UPPER EXTREMITY ARTERIOVENOUS FISTULA CREATION;  Surgeon: Maeola Harman, MD;  Location: [facility] OR;  Service: Vascular;  Laterality: Left;   AV FISTULA PLACEMENT Right 05/12/2023   Procedure: RIGHT UPPER EXTREMITY ARTERIOVENOUS (AV) FISTULA CREATION;  Surgeon: Maeola Harman, MD;  Location: [facility] OR;  Service: Vascular;  Laterality: Right;   BASCILIC VEIN TRANSPOSITION Right 05/06/2020   Procedure: FIRST STAGE RIGHT BASCILIC VEIN TRANSPOSITION;  Surgeon: Nada Libman, MD;  Location: [facility] OR;  Service: Vascular;  Laterality: Right;   BASCILIC VEIN TRANSPOSITION Left 08/20/2021   Procedure: LEFT ARM SECOND STAGE WITH ARTERIOVENOUS GRAFT;  Surgeon: Maeola Harman, MD;  Location: [facility] OR;  Service: Vascular;  Laterality: Left;   DIALYSIS/PERMA CATHETER INSERTION N/A 06/21/2023   Procedure: DIALYSIS/PERMA CATHETER INSERTION;  Surgeon: Ethelene Hal, MD;  Location: [facility] INVASIVE CV LAB;  Service: Cardiovascular;  Laterality: N/A;   DIALYSIS/PERMA CATHETER REMOVAL N/A 06/21/2023   Procedure: DIALYSIS/PERMA CATHETER REMOVAL;  Surgeon: Ethelene Hal, MD;  Location: [facility] INVASIVE CV LAB;  Service: Cardiovascular;  Laterality: N/A;   EXCHANGE OF A DIALYSIS CATHETER Right 10/08/2016   Procedure: EXCHANGE OF A DIALYSIS CATHETER-RIGHT INTERNAL JUGULAR;  Surgeon: Fransisco Hertz, MD;  Location: [facility] OR;  Service: Vascular;  Laterality: Right;   INSERTION OF DIALYSIS CATHETER Right 09/26/2016   Procedure: INSERTION OF DIALYSIS CATHETER - Right Internal Jugular Placement;  Surgeon: Sherren Kerns, MD;  Location: [facility] OR;  Service: Vascular;  Laterality: Right;   INSERTION OF DIALYSIS CATHETER Left  03/22/2018   Procedure: INSERTION OF DIALYSIS CATHETER;  Surgeon: Cephus Shelling, MD;  Location: [facility] OR;  Service: Vascular;  Laterality: Left;   INTRAOPERATIVE ARTERIOGRAM Left 05/04/2022   Procedure: INTRA OPERATIVE ARTERIOGRAM;  Surgeon: Victorino Sparrow, MD;  Location: [facility] OR;  Service: Vascular;  Laterality: Left;   IR AV DIALY SHUNT INTRO NEEDLE/INTRAC INITIAL W/PTA/STENT/IMG LT Left 02/17/2023   IR AV DIALY SHUNT INTRO NEEDLE/INTRACATH INITIAL W/PTA/IMG LEFT  08/19/2022   IR AV DIALY SHUNT INTRO NEEDLE/INTRACATH INITIAL W/PTA/IMG LEFT  11/14/2022   IR DIALY SHUNT INTRO NEEDLE/INTRACATH INITIAL W/IMG LEFT Left 04/15/2022   IR FLUORO GUIDE CV LINE LEFT  04/19/2022   IR FLUORO GUIDE CV LINE LEFT  05/30/2022   IR FLUORO GUIDE CV LINE LEFT  03/24/2023   IR FLUORO GUIDE CV LINE RIGHT  03/26/2021   IR FLUORO GUIDE CV LINE RIGHT  05/13/2021   IR FLUORO GUIDE CV LINE RIGHT  05/04/2022   IR FLUORO GUIDE CV LINE RIGHT  02/17/2023   IR REMOVAL TUN CV CATH W/O FL  05/11/2021   IR REMOVAL TUN CV CATH W/O FL  10/06/2021   IR THROMBECTOMY AV FISTULA W/THROMBOLYSIS/PTA INC/SHUNT/IMG LEFT Left 02/02/2022   IR THROMBECTOMY AV FISTULA W/THROMBOLYSIS/PTA INC/SHUNT/IMG LEFT Left 03/22/2022   IR THROMBECTOMY AV FISTULA W/THROMBOLYSIS/PTA INC/SHUNT/IMG LEFT Left 05/04/2022   IR THROMBECTOMY AV FISTULA W/THROMBOLYSIS/PTA INC/SHUNT/IMG LEFT Left 12/09/2022   IR THROMBECTOMY AV FISTULA W/THROMBOLYSIS/PTA/STENT INC/SHUNT/IMG LT Left 04/27/2022   IR US GUIDE VASC ACCESS LEFT  02/02/2022   IR US GUIDE VASC ACCESS LEFT  03/22/2022   IR US GUIDE VASC ACCESS LEFT  04/15/2022   IR US GUIDE VASC ACCESS LEFT  04/19/2022   IR US GUIDE VASC ACCESS LEFT  04/27/2022   IR US GUIDE VASC ACCESS LEFT  12/13/2022   IR US GUIDE VASC ACCESS LEFT  02/21/2023   IR US GUIDE VASC ACCESS RIGHT  03/26/2021   IR US GUIDE VASC ACCESS RIGHT  05/13/2021   IR US GUIDE VASC ACCESS RIGHT  05/04/2022   IR US GUIDE VASC ACCESS RIGHT  02/17/2023    PERIPHERAL VASCULAR BALLOON ANGIOPLASTY  06/21/2023   Procedure: PERIPHERAL VASCULAR BALLOON ANGIOPLASTY;  Surgeon: Ethelene Hal, MD;  Location: [facility] INVASIVE CV LAB;  Service: Cardiovascular;;  Innominate Vein   REVISON OF ARTERIOVENOUS FISTULA Left 03/22/2018   Procedure: LEFT UPPER EXTREMITY ARTERIOVENOUS  REVISION  WITH GORE-TEX GRAFT.;  Surgeon: Cephus Shelling, MD;  Location: [facility] OR;  Service: Vascular;  Laterality: Left;   TEE WITHOUT CARDIOVERSION N/A 05/12/2021   Procedure: TRANSESOPHAGEAL ECHOCARDIOGRAM (TEE);  Surgeon: Christell Constant, MD;  Location: [facility] ENDOSCOPY;  Service: Cardiovascular;  Laterality: N/A;   THROMBECTOMY Left 08/26/2019   thrombectomy of LUA loop AVG   THROMBECTOMY AND REVISION OF ARTERIOVENTOUS (AV) GORETEX  GRAFT Right 07/27/2020   Procedure: INSERTION OF RIGHT ARM LOOP GRAFT WITH EXCISION OF RIGHT ARM BRACHIAL AXILLARY GRAFT;  Surgeon: Cephus Shelling, MD;  Location: [facility] OR;  Service: Vascular;  Laterality: Right;   THROMBECTOMY BRACHIAL ARTERY Left 05/04/2022   Procedure: LEFT ARM RADIAL BRACHIAL ARTERY EMBOLECTOMY;  Surgeon: Victorino Sparrow, MD;  Location: [name] OR;  Service: Vascular;  Laterality: Left;   UPPER EXTREMITY VENOGRAPHY Bilateral 06/14/2021   Procedure: UPPER EXTREMITY VENOGRAPHY;  Surgeon: Maeola Harman, MD;  Location: [facility] INVASIVE CV LAB;  Service: Cardiovascular;  Laterality: Bilateral;   UPPER EXTREMITY VENOGRAPHY N/A 04/24/2023   Procedure: UPPER EXTREMITY VENOGRAPHY;  Surgeon: Maeola Harman, MD;  Location: [facility] INVASIVE CV LAB;  Service: Cardiovascular;  Laterality: N/A;   VISCERAL ANGIOGRAPHY Right 06/17/2020   Procedure: CENTRAL VENO;  Surgeon: Leonie Douglas, MD;  Location: [facility] INVASIVE CV LAB;  Service: Cardiovascular;  Laterality: Right;   Social History   Occupational History   Not on file  Tobacco Use   Smoking status: Never   Smokeless tobacco: Never  Vaping Use   Vaping status: Never Used  Substance  and Sexual Activity   Alcohol use: No   Drug use: Yes    Frequency: 2.0 times per week    Types: Marijuana    Comment: 1-2 times day   Sexual activity: Yes

## 2023-07-26 NOTE — Telephone Encounter (Signed)
 Patient called to ask for a reschedule of surgery with Dr. Randie Heinz.  He will call his dialysis center to see if they can accommodate him on Monday, 3/10.  He would like to have surgery on 3/11, if possible.

## 2023-07-31 ENCOUNTER — Encounter (HOSPITAL_COMMUNITY): Payer: Self-pay | Admitting: Vascular Surgery

## 2023-07-31 ENCOUNTER — Other Ambulatory Visit: Payer: Self-pay

## 2023-07-31 NOTE — Progress Notes (Signed)
 SDW CALL  Patient was given pre-op instructions over the phone. The opportunity was given for the patient to ask questions. No further questions asked. Patient verbalized understanding of instructions given.   PCP - Merlene Pulling Cardiologist -   PPM/ICD - denies Device Orders -  Rep Notified -   Chest x-ray - 07/30/22 EKG - 09/29/22 Stress Test - 03/06/20 CE ECHO - 05/09/21 Cardiac Cath - denies  Sleep Study - denies CPAP -   Fasting Blood Sugar -130-140 Checks Blood Sugar two times a day  Blood Thinner Instructions:hold Eliquis 3 days prior to surgery per Dr. Randie Heinz. Pt reports last dose was 07/28/23. Aspirin Instructions:continue     COVID TEST- na   Anesthesia review: no  Patient denies shortness of breath, fever, cough and chest pain over the phone call    Surgical Instructions    Your procedure is scheduled on March 11  Report to [facility] Main Entrance "A" at 1130 A.M., then check in with the Admitting office.  Call this number if you have problems the morning of surgery:  [phone]    Remember:  Do not eat or drink anything after midnight the night before your surgery     Take these medicines the morning of surgery with A SIP OF WATER: Gabapentin,Synthroid,Crestor  As of today, STOP taking any Aspirin (unless otherwise instructed by your surgeon) Aleve, Naproxen, Ibuprofen, Motrin, Advil, Goody's, BC's, all herbal medications, fish oil, and all vitamins.  WHAT DO I DO ABOUT MY DIABETES MEDICATION?  THE NIGHT BEFORE SURGERY, take 2 units of Lantus insulin.       THE MORNING OF SURGERY, take 2 units of Lantus insulin.   HOW TO MANAGE YOUR DIABETES BEFORE AND AFTER SURGERY   Check your blood sugar the morning of your surgery when you wake up and every 2 hours until you get to the Short Stay unit.  [facility] is not responsible for any belongings or valuables. .   Do NOT Smoke (Tobacco/Vaping)  24 hours prior to your procedure  If you use  a CPAP at night, you may bring your mask for your overnight stay.   Contacts, glasses, hearing aids, dentures or partials may not be worn into surgery, please bring cases for these belongings   Patients discharged the day of surgery will not be allowed to drive home, and someone needs to stay with them for 24 hours.   SURGICAL WAITING ROOM VISITATION You may have 1 visitor in the pre-op area at a time determined by the pre-op nurse. (Visitor may not switch out) Patients having surgery or a procedure in a hospital may have two support people in the waiting room. Children under the age of 4 must have an adult with them who is not the patient. They may stay in the waiting area during the procedure and may switch out with other visitors. If the patient needs to stay at the hospital during part of their recovery, the visitor guidelines for inpatient rooms apply.  Please refer to the [facility] website for the visitor guidelines for Inpatients (after your surgery is over and you are in a regular room).     Special instructions:    Oral Hygiene is also important to reduce your risk of infection.  Remember - BRUSH YOUR TEETH THE MORNING OF SURGERY WITH YOUR REGULAR TOOTHPASTE   Day of Surgery:  Take a shower the day of or night before with antibacterial soap. Wear Clean/Comfortable clothing the morning of surgery Do  not apply any deodorants/lotions.   Do not wear jewelry or makeup Do not wear lotions, powders, perfumes/colognes, or deodorant. Do not shave 48 hours prior to surgery.  Men may shave face and neck. Do not bring valuables to the hospital. Do not wear nail polish, gel polish, artificial nails, or any other type of covering on natural nails (fingers and toes) If you have artificial nails or gel coating that need to be removed by a nail salon, please have this removed prior to surgery. Artificial nails or gel coating may interfere with anesthesia's ability to adequately monitor your  vital signs. Remember to brush your teeth WITH YOUR REGULAR TOOTHPASTE.

## 2023-08-01 ENCOUNTER — Encounter (HOSPITAL_COMMUNITY): Payer: Self-pay | Admitting: Vascular Surgery

## 2023-08-01 ENCOUNTER — Other Ambulatory Visit: Payer: Self-pay

## 2023-08-01 ENCOUNTER — Ambulatory Visit (HOSPITAL_BASED_OUTPATIENT_CLINIC_OR_DEPARTMENT_OTHER): Admitting: Registered Nurse

## 2023-08-01 ENCOUNTER — Ambulatory Visit (HOSPITAL_COMMUNITY)
Admission: RE | Admit: 2023-08-01 | Discharge: 2023-08-01 | Disposition: A | Discharge status: Home / Self Care | Attending: Vascular Surgery | Admitting: Vascular Surgery

## 2023-08-01 ENCOUNTER — Other Ambulatory Visit (HOSPITAL_COMMUNITY): Payer: Self-pay

## 2023-08-01 ENCOUNTER — Ambulatory Visit (HOSPITAL_COMMUNITY): Admitting: Registered Nurse

## 2023-08-01 ENCOUNTER — Encounter (HOSPITAL_COMMUNITY)
Admission: RE | Disposition: A | Discharge status: Home / Self Care | Payer: Self-pay | Source: Home / Self Care | Attending: Vascular Surgery

## 2023-08-01 DIAGNOSIS — Z6841 Body Mass Index (BMI) 40.0 and over, adult: Secondary | ICD-10-CM | POA: Insufficient documentation

## 2023-08-01 DIAGNOSIS — N185 Chronic kidney disease, stage 5: Secondary | ICD-10-CM

## 2023-08-01 DIAGNOSIS — E66813 Obesity, class 3: Secondary | ICD-10-CM | POA: Insufficient documentation

## 2023-08-01 DIAGNOSIS — Z992 Dependence on renal dialysis: Secondary | ICD-10-CM | POA: Diagnosis not present

## 2023-08-01 DIAGNOSIS — E1122 Type 2 diabetes mellitus with diabetic chronic kidney disease: Secondary | ICD-10-CM | POA: Insufficient documentation

## 2023-08-01 DIAGNOSIS — N186 End stage renal disease: Secondary | ICD-10-CM | POA: Diagnosis not present

## 2023-08-01 DIAGNOSIS — Z794 Long term (current) use of insulin: Secondary | ICD-10-CM | POA: Diagnosis not present

## 2023-08-01 DIAGNOSIS — K219 Gastro-esophageal reflux disease without esophagitis: Secondary | ICD-10-CM | POA: Diagnosis not present

## 2023-08-01 DIAGNOSIS — D631 Anemia in chronic kidney disease: Secondary | ICD-10-CM | POA: Diagnosis not present

## 2023-08-01 DIAGNOSIS — I120 Hypertensive chronic kidney disease with stage 5 chronic kidney disease or end stage renal disease: Secondary | ICD-10-CM | POA: Diagnosis not present

## 2023-08-01 DIAGNOSIS — F1290 Cannabis use, unspecified, uncomplicated: Secondary | ICD-10-CM | POA: Insufficient documentation

## 2023-08-01 HISTORY — PX: BASCILIC VEIN TRANSPOSITION: SHX5742

## 2023-08-01 HISTORY — PX: INSERTION OF ARTERIOVENOUS (AV) ARTEGRAFT ARM: SHX6779

## 2023-08-01 LAB — GLUCOSE, CAPILLARY
Glucose-Capillary: 155 mg/dL — ABNORMAL HIGH (ref 70–99)
Glucose-Capillary: 173 mg/dL — ABNORMAL HIGH (ref 70–99)

## 2023-08-01 LAB — POCT I-STAT, CHEM 8
BUN: 27 mg/dL — ABNORMAL HIGH (ref 6–20)
Calcium, Ion: 1.05 mmol/L — ABNORMAL LOW (ref 1.15–1.40)
Chloride: 100 mmol/L (ref 98–111)
Creatinine, Ser: 12 mg/dL — ABNORMAL HIGH (ref 0.61–1.24)
Glucose, Bld: 135 mg/dL — ABNORMAL HIGH (ref 70–99)
HCT: 44 % (ref 39.0–52.0)
Hemoglobin: 15 g/dL (ref 13.0–17.0)
Potassium: 4 mmol/L (ref 3.5–5.1)
Sodium: 135 mmol/L (ref 135–145)
TCO2: 25 mmol/L (ref 22–32)

## 2023-08-01 SURGERY — TRANSPOSITION, VEIN, BASILIC
Anesthesia: General | Site: Arm Upper | Laterality: Right

## 2023-08-01 MED ORDER — ROCURONIUM BROMIDE 10 MG/ML (PF) SYRINGE
PREFILLED_SYRINGE | INTRAVENOUS | Status: DC | PRN
  Administered 2023-08-01: 40 mg via INTRAVENOUS

## 2023-08-01 MED ORDER — 0.9 % SODIUM CHLORIDE (POUR BTL) OPTIME
TOPICAL | Status: DC | PRN
  Administered 2023-08-01: 1000 mL

## 2023-08-01 MED ORDER — FENTANYL CITRATE (PF) 250 MCG/5ML IJ SOLN
INTRAMUSCULAR | Status: AC
  Filled 2023-08-01: qty 5

## 2023-08-01 MED ORDER — LIDOCAINE 2% (20 MG/ML) 5 ML SYRINGE
INTRAMUSCULAR | Status: DC | PRN
  Administered 2023-08-01: 60 mg via INTRAVENOUS

## 2023-08-01 MED ORDER — ALBUMIN HUMAN 5 % IV SOLN: INTRAVENOUS | Status: DC | PRN

## 2023-08-01 MED ORDER — EPHEDRINE SULFATE-NACL 50-0.9 MG/10ML-% IV SOSY
PREFILLED_SYRINGE | INTRAVENOUS | Status: DC | PRN
  Administered 2023-08-01 (×2): 5 mg via INTRAVENOUS

## 2023-08-01 MED ORDER — AMISULPRIDE (ANTIEMETIC) 5 MG/2ML IV SOLN: 10.0000 mg | Freq: Once | INTRAVENOUS | Status: AC | PRN

## 2023-08-01 MED ORDER — SUCCINYLCHOLINE CHLORIDE 200 MG/10ML IV SOSY
PREFILLED_SYRINGE | INTRAVENOUS | Status: DC | PRN
  Administered 2023-08-01: 200 mg via INTRAVENOUS

## 2023-08-01 MED ORDER — ACETAMINOPHEN 500 MG PO TABS
1000.0000 mg | ORAL_TABLET | Freq: Once | ORAL | Status: AC
  Administered 2023-08-01: 1000 mg via ORAL
  Filled 2023-08-01: qty 2

## 2023-08-01 MED ORDER — CHLORHEXIDINE GLUCONATE 4 % EX SOLN
60.0000 mL | Freq: Once | CUTANEOUS | Status: DC
Start: 2023-08-01 — End: 2023-08-01

## 2023-08-01 MED ORDER — HEPARIN 6000 UNIT IRRIGATION SOLUTION
Status: DC | PRN
  Administered 2023-08-01: 1

## 2023-08-01 MED ORDER — MIDAZOLAM HCL 2 MG/2ML IJ SOLN
INTRAMUSCULAR | Status: AC
  Filled 2023-08-01: qty 2

## 2023-08-01 MED ORDER — CEFAZOLIN SODIUM-DEXTROSE 3-4 GM/150ML-% IV SOLN
3.0000 g | INTRAVENOUS | Status: AC
  Administered 2023-08-01: 3 g via INTRAVENOUS

## 2023-08-01 MED ORDER — HEPARIN 6000 UNIT IRRIGATION SOLUTION
Status: AC
  Filled 2023-08-01: qty 500

## 2023-08-01 MED ORDER — CHLORHEXIDINE GLUCONATE 0.12 % MT SOLN
OROMUCOSAL | Status: AC
  Administered 2023-08-01: 15 mL via OROMUCOSAL
  Filled 2023-08-01: qty 15

## 2023-08-01 MED ORDER — ORAL CARE MOUTH RINSE: 15.0000 mL | Freq: Once | OROMUCOSAL | Status: AC

## 2023-08-01 MED ORDER — ONDANSETRON HCL 4 MG/2ML IJ SOLN
INTRAMUSCULAR | Status: DC | PRN
  Administered 2023-08-01: 4 mg via INTRAVENOUS

## 2023-08-01 MED ORDER — CEFAZOLIN SODIUM-DEXTROSE 3-4 GM/150ML-% IV SOLN
INTRAVENOUS | Status: AC
  Filled 2023-08-01: qty 150

## 2023-08-01 MED ORDER — CHLORHEXIDINE GLUCONATE 0.12 % MT SOLN: 15.0000 mL | Freq: Once | OROMUCOSAL | Status: AC

## 2023-08-01 MED ORDER — SODIUM CHLORIDE 0.9% FLUSH: 3.0000 mL | Freq: Two times a day (BID) | INTRAVENOUS | Status: DC

## 2023-08-01 MED ORDER — PHENYLEPHRINE HCL-NACL 20-0.9 MG/250ML-% IV SOLN
INTRAVENOUS | Status: DC | PRN
  Administered 2023-08-01: 50 ug/min via INTRAVENOUS

## 2023-08-01 MED ORDER — VASOPRESSIN 20 UNIT/ML IV SOLN
INTRAVENOUS | Status: DC | PRN
  Administered 2023-08-01 (×5): 2 [IU] via INTRAVENOUS

## 2023-08-01 MED ORDER — FENTANYL CITRATE (PF) 100 MCG/2ML IJ SOLN
INTRAMUSCULAR | Status: AC
  Filled 2023-08-01: qty 2

## 2023-08-01 MED ORDER — OXYCODONE-ACETAMINOPHEN 5-325 MG PO TABS
1.0000 | ORAL_TABLET | Freq: Four times a day (QID) | ORAL | 0 refills | Status: DC | PRN
  Filled 2023-08-01: qty 20, 5d supply, fill #0

## 2023-08-01 MED ORDER — INSULIN ASPART 100 UNIT/ML IJ SOLN: 0.0000 [IU] | INTRAMUSCULAR | Status: DC | PRN

## 2023-08-01 MED ORDER — SODIUM CHLORIDE 0.9 % IV SOLN: INTRAVENOUS | Status: DC

## 2023-08-01 MED ORDER — SUGAMMADEX SODIUM 200 MG/2ML IV SOLN
INTRAVENOUS | Status: AC
  Filled 2023-08-01: qty 2

## 2023-08-01 MED ORDER — FENTANYL CITRATE (PF) 250 MCG/5ML IJ SOLN
INTRAMUSCULAR | Status: DC | PRN
  Administered 2023-08-01 (×5): 50 ug via INTRAVENOUS

## 2023-08-01 MED ORDER — PROPOFOL 10 MG/ML IV BOLUS
INTRAVENOUS | Status: AC
  Filled 2023-08-01: qty 20

## 2023-08-01 MED ORDER — SUGAMMADEX SODIUM 200 MG/2ML IV SOLN
INTRAVENOUS | Status: DC | PRN
  Administered 2023-08-01: 400 mg via INTRAVENOUS

## 2023-08-01 MED ORDER — FENTANYL CITRATE (PF) 100 MCG/2ML IJ SOLN
25.0000 ug | INTRAMUSCULAR | Status: DC | PRN
  Administered 2023-08-01 (×2): 50 ug via INTRAVENOUS

## 2023-08-01 MED ORDER — CHLORHEXIDINE GLUCONATE 4 % EX SOLN: 60.0000 mL | Freq: Once | CUTANEOUS | Status: DC

## 2023-08-01 MED ORDER — PROPOFOL 10 MG/ML IV BOLUS
INTRAVENOUS | Status: DC | PRN
  Administered 2023-08-01: 30 mg via INTRAVENOUS
  Administered 2023-08-01: 200 mg via INTRAVENOUS

## 2023-08-01 MED ORDER — AMISULPRIDE (ANTIEMETIC) 5 MG/2ML IV SOLN
INTRAVENOUS | Status: AC
  Administered 2023-08-01: 10 mg via INTRAVENOUS
  Filled 2023-08-01: qty 4

## 2023-08-01 MED ORDER — SODIUM CHLORIDE 0.9% FLUSH: 3.0000 mL | INTRAVENOUS | Status: DC | PRN

## 2023-08-01 SURGICAL SUPPLY — 39 items
ARMBAND PINK RESTRICT EXTREMIT (MISCELLANEOUS) ×1 IMPLANT
BAG COUNTER SPONGE SURGICOUNT (BAG) ×1 IMPLANT
BNDG ELASTIC 4X5.8 VLCR NS LF (GAUZE/BANDAGES/DRESSINGS) IMPLANT
BNDG ELASTIC 6INX 5YD STR LF (GAUZE/BANDAGES/DRESSINGS) IMPLANT
BNDG GAUZE DERMACEA FLUFF 4 (GAUZE/BANDAGES/DRESSINGS) IMPLANT
CANISTER SUCT 3000ML PPV (MISCELLANEOUS) ×1 IMPLANT
CATH EMB 3FR 40 (CATHETERS) IMPLANT
CLIP LIGATING EXTRA MED SLVR (CLIP) ×1 IMPLANT
CLIP LIGATING EXTRA SM BLUE (MISCELLANEOUS) ×1 IMPLANT
COVER PROBE W GEL 5X96 (DRAPES) ×1 IMPLANT
DERMABOND ADVANCED .7 DNX12 (GAUZE/BANDAGES/DRESSINGS) ×1 IMPLANT
ELECT REM PT RETURN 9FT ADLT (ELECTROSURGICAL) ×1 IMPLANT
ELECTRODE REM PT RTRN 9FT ADLT (ELECTROSURGICAL) ×1 IMPLANT
GAUZE SPONGE 4X4 12PLY STRL (GAUZE/BANDAGES/DRESSINGS) IMPLANT
GLOVE BIO SURGEON STRL SZ7.5 (GLOVE) ×1 IMPLANT
GOWN STRL REUS W/ TWL LRG LVL3 (GOWN DISPOSABLE) ×2 IMPLANT
GOWN STRL REUS W/ TWL XL LVL3 (GOWN DISPOSABLE) ×1 IMPLANT
GRAFT GORETEX STRT 4-7X45 (Vascular Products) IMPLANT
INSERT FOGARTY SM (MISCELLANEOUS) IMPLANT
KIT BASIN OR (CUSTOM PROCEDURE TRAY) ×1 IMPLANT
KIT TURNOVER KIT B (KITS) ×1 IMPLANT
NS IRRIG 1000ML POUR BTL (IV SOLUTION) ×1 IMPLANT
PACK CV ACCESS (CUSTOM PROCEDURE TRAY) ×1 IMPLANT
PAD ARMBOARD 7.5X6 YLW CONV (MISCELLANEOUS) ×2 IMPLANT
POWDER SURGICEL 3.0 GRAM (HEMOSTASIS) IMPLANT
SLING ARM FOAM STRAP LRG (SOFTGOODS) IMPLANT
SLING ARM FOAM STRAP MED (SOFTGOODS) IMPLANT
SPONGE T-LAP 18X18 ~~LOC~~+RFID (SPONGE) IMPLANT
STAPLER SKIN PROX 35W (STAPLE) IMPLANT
STOPCOCK 4 WAY LG BORE MALE ST (IV SETS) IMPLANT
SUT MNCRL AB 4-0 PS2 18 (SUTURE) ×1 IMPLANT
SUT PROLENE 5 0 C 1 24 (SUTURE) IMPLANT
SUT PROLENE 6 0 BV (SUTURE) ×1 IMPLANT
SUT SILK 2 0 SH (SUTURE) IMPLANT
SUT VIC AB 2-0 CT1 TAPERPNT 27 (SUTURE) IMPLANT
SUT VIC AB 3-0 SH 27X BRD (SUTURE) ×1 IMPLANT
TOWEL GREEN STERILE (TOWEL DISPOSABLE) ×1 IMPLANT
UNDERPAD 30X36 HEAVY ABSORB (UNDERPADS AND DIAPERS) ×1 IMPLANT
WATER STERILE IRR 1000ML POUR (IV SOLUTION) ×1 IMPLANT

## 2023-08-01 NOTE — Progress Notes (Signed)
 Pt. Took another dose of midodrine per Dr. Glade Stanford. Pt. Had his medication from home.

## 2023-08-01 NOTE — Transfer of Care (Signed)
 Immediate Anesthesia Transfer of Care Note  Patient: Paul [name]  Procedure(s) Performed: RIGHT UPPER ARM FISTULA LIGATION (Right: Arm Upper) INSERTION, GRAFT, ARTERIOVENOUS, RIGHT UPPER EXTREMITY USING GORE TEX (Right: Arm Upper)  Patient Location: PACU  Anesthesia Type:General  Level of Consciousness: awake, alert , and oriented  Airway & Oxygen Therapy: Patient Spontanous Breathing  Post-op Assessment: Report given to RN and Post -op Vital signs reviewed and stable  Post vital signs: Reviewed and stable  Last Vitals:  Vitals Value Taken Time  BP 133/78 08/01/23 1503  Temp    Pulse 107 08/01/23 1505  Resp 13 08/01/23 1505  SpO2 100 % 08/01/23 1505  Vitals shown include unfiled device data.  Last Pain:  Vitals:   08/01/23 1136  TempSrc:   PainSc: 0-No pain         Complications: No notable events documented.

## 2023-08-01 NOTE — H&P (Signed)
 HPI: Paul [name] is a 33 y.o. (1990-10-23) male who presents status post right first stage basilic vein fistula creation by Dr. Randie Heinz involving endarterectomy of the brachial artery on 05/12/2023.  He is dialyzing via right IJ [facility] which was recently exchanged by Dr. Juel Burrow.  He dialyzes on a Tuesday Thursday Saturday schedule.  He denies any steal symptoms in his right hand.  He believes the incisions have healed.  He is on Eliquis.         Past Medical History:  Diagnosis Date   Acute hypoxemic respiratory failure (HCC) 09/11/2016   Anemia     Chronic kidney disease      ARF on CRF Dialysis T/TH/Sa   Diabetes mellitus      Type II   End stage renal disease on dialysis (HCC)      [place]    GERD (gastroesophageal reflux disease)      diet controlled   HLD (hyperlipidemia)     Hypertension     Hypothyroidism     Morbid obesity (HCC)     Sacral wound      resolved   Thyroid disease                 Past Surgical History:  Procedure Laterality Date   AMPUTATION Left 05/08/2021    Procedure: AMPUTATION GREAT TOE;  Surgeon: Nadara Mustard, MD;  Location: [facility] OR;  Service: Orthopedics;  Laterality: Left;   AMPUTATION Left 10/01/2022    Procedure: LEFT FOOT AMPUTATION RAY 1st;  Surgeon: Nadara Mustard, MD;  Location: [facility] OR;  Service: Orthopedics;  Laterality: Left;   AV FISTULA PLACEMENT Left 09/26/2016    Procedure: LEFT UPPER ARM ARTERIOVENOUS (AV) FISTULA CREATION;  Surgeon: Sherren Kerns, MD;  Location: [facility] OR;  Service: Vascular;  Laterality: Left;   AV FISTULA PLACEMENT Left 12/02/2016    Procedure: INSERTION OF ARTERIOVENOUS GORE-TEX GRAFT LEFT UPPER  ARM USING A 4-7MM BY 45CM GRAFT ;  Surgeon: Larina Earthly, MD;  Location: [facility] OR;  Service: Vascular;  Laterality: Left;   AV FISTULA PLACEMENT Right 07/03/2020    Procedure: INSERTION OF ARTERIOVENOUS (AV) GORE-TEX GRAFT RIGHT UPPER ARM;  Surgeon: Cephus Shelling, MD;  Location: [facility] OR;  Service: Vascular;  Laterality:  Right;   AV FISTULA PLACEMENT Left 07/02/2021    Procedure: LEFT UPPER EXTREMITY ARTERIOVENOUS FISTULA CREATION;  Surgeon: Maeola Harman, MD;  Location: [facility] OR;  Service: Vascular;  Laterality: Left;   AV FISTULA PLACEMENT Right 05/12/2023    Procedure: RIGHT UPPER EXTREMITY ARTERIOVENOUS (AV) FISTULA CREATION;  Surgeon: Maeola Harman, MD;  Location: [facility] OR;  Service: Vascular;  Laterality: Right;   BASCILIC VEIN TRANSPOSITION Right 05/06/2020    Procedure: FIRST STAGE RIGHT BASCILIC VEIN TRANSPOSITION;  Surgeon: Nada Libman, MD;  [facility]: [facility];  Service: Vascular;  Laterality: Right;   BASCILIC VEIN TRANSPOSITION Left 08/20/2021    Procedure: LEFT ARM SECOND STAGE WITH ARTERIOVENOUS GRAFT;  Surgeon: Maeola Harman, MD;  Location: [facility] OR;  Service: Vascular;  Laterality: Left;   DIALYSIS/PERMA CATHETER INSERTION N/A 06/21/2023    Procedure: DIALYSIS/PERMA CATHETER INSERTION;  Surgeon: Ethelene Hal, MD;  [facility] INVASIVE CV LAB;  Service: Cardiovascular;  Laterality: N/A;   DIALYSIS/PERMA CATHETER REMOVAL N/A 06/21/2023    Procedure: DIALYSIS/PERMA CATHETER REMOVAL;  Surgeon: Ethelene Hal, MD;  Location: [facility] INVASIVE CV LAB;  Service: Cardiovascular;  Laterality: N/A;   EXCHANGE OF A DIALYSIS CATHETER Right  10/08/2016    Procedure: EXCHANGE OF A DIALYSIS CATHETER-RIGHT INTERNAL JUGULAR;  Surgeon: Fransisco Hertz, MD;  Location: [facility] OR;  Service: Vascular;  Laterality: Right;   INSERTION OF DIALYSIS CATHETER Right 09/26/2016    Procedure: INSERTION OF DIALYSIS CATHETER - Right Internal Jugular Placement;  Surgeon: Sherren Kerns, MD;  Location: [facility];  Service: Vascular;  Laterality: Right;   INSERTION OF DIALYSIS CATHETER Left 03/22/2018    Procedure: INSERTION OF DIALYSIS CATHETER;  Surgeon: Cephus Shelling, MD;  Location: [facility] OR;  Service: Vascular;  Laterality: Left;   INTRAOPERATIVE ARTERIOGRAM Left 05/04/2022    Procedure: INTRA OPERATIVE ARTERIOGRAM;   Surgeon: Victorino Sparrow, MD;  Location: [facility] OR;  Service: Vascular;  Laterality: Left;   IR AV DIALY SHUNT INTRO NEEDLE/INTRAC INITIAL W/PTA/STENT/IMG LT Left 02/17/2023   IR AV DIALY SHUNT INTRO NEEDLE/INTRACATH INITIAL W/PTA/IMG LEFT   08/19/2022   IR AV DIALY SHUNT INTRO NEEDLE/INTRACATH INITIAL W/PTA/IMG LEFT   11/14/2022   IR DIALY SHUNT INTRO NEEDLE/INTRACATH INITIAL W/IMG LEFT Left 04/15/2022   IR FLUORO GUIDE CV LINE LEFT   04/19/2022   IR FLUORO GUIDE CV LINE LEFT   05/30/2022   IR FLUORO GUIDE CV LINE LEFT   03/24/2023   IR FLUORO GUIDE CV LINE RIGHT   03/26/2021   IR FLUORO GUIDE CV LINE RIGHT   05/13/2021   IR FLUORO GUIDE CV LINE RIGHT   05/04/2022   IR FLUORO GUIDE CV LINE RIGHT   02/17/2023   IR REMOVAL TUN CV CATH W/O FL   05/11/2021   IR REMOVAL TUN CV CATH W/O FL   10/06/2021   IR THROMBECTOMY AV FISTULA W/THROMBOLYSIS/PTA INC/SHUNT/IMG LEFT Left 02/02/2022   IR THROMBECTOMY AV FISTULA W/THROMBOLYSIS/PTA INC/SHUNT/IMG LEFT Left 03/22/2022   IR THROMBECTOMY AV FISTULA W/THROMBOLYSIS/PTA INC/SHUNT/IMG LEFT Left 05/04/2022   IR THROMBECTOMY AV FISTULA W/THROMBOLYSIS/PTA INC/SHUNT/IMG LEFT Left 12/09/2022   IR THROMBECTOMY AV FISTULA W/THROMBOLYSIS/PTA/STENT INC/SHUNT/IMG LT Left 04/27/2022   IR US GUIDE VASC ACCESS LEFT   02/02/2022   IR US GUIDE VASC ACCESS LEFT   03/22/2022   IR US GUIDE VASC ACCESS LEFT   04/15/2022   IR US GUIDE VASC ACCESS LEFT   04/19/2022   IR US GUIDE VASC ACCESS LEFT   04/27/2022   IR US GUIDE VASC ACCESS LEFT   12/13/2022   IR US GUIDE VASC ACCESS LEFT   02/21/2023   IR US GUIDE VASC ACCESS RIGHT   03/26/2021   IR US GUIDE VASC ACCESS RIGHT   05/13/2021   IR US GUIDE VASC ACCESS RIGHT   05/04/2022   IR US GUIDE VASC ACCESS RIGHT   02/17/2023   PERIPHERAL VASCULAR BALLOON ANGIOPLASTY   06/21/2023    Procedure: PERIPHERAL VASCULAR BALLOON ANGIOPLASTY;  Surgeon: Ethelene Hal, MD;  [facility]: [facility] INVASIVE CV LAB;  Service: Cardiovascular;;  Innominate Vein   REVISON  OF ARTERIOVENOUS FISTULA Left 03/22/2018    Procedure: LEFT UPPER EXTREMITY ARTERIOVENOUS  REVISION WITH GORE-TEX GRAFT.;  Surgeon: Cephus Shelling, MD;  Location: [facility] OR;  Service: Vascular;  Laterality: Left;   TEE WITHOUT CARDIOVERSION N/A 05/12/2021    Procedure: TRANSESOPHAGEAL ECHOCARDIOGRAM (TEE);  Surgeon: Christell Constant, MD;  Location: [facility] ENDOSCOPY;  Service: Cardiovascular;  Laterality: N/A;   THROMBECTOMY Left 08/26/2019    thrombectomy of LUA loop AVG   THROMBECTOMY AND REVISION OF ARTERIOVENTOUS (AV) GORETEX  GRAFT Right 07/27/2020    Procedure: INSERTION OF RIGHT ARM LOOP GRAFT WITH EXCISION OF RIGHT  ARM BRACHIAL AXILLARY GRAFT;  Surgeon: Cephus Shelling, MD;  Location: [facility] OR;  Service: Vascular;  Laterality: Right;   THROMBECTOMY BRACHIAL ARTERY Left 05/04/2022    Procedure: LEFT ARM RADIAL BRACHIAL ARTERY EMBOLECTOMY;  Surgeon: Victorino Sparrow, MD;  Location: [facility] OR;  Service: Vascular;  Laterality: Left;   UPPER EXTREMITY VENOGRAPHY Bilateral 06/14/2021    Procedure: UPPER EXTREMITY VENOGRAPHY;  Surgeon: Maeola Harman, MD;  Location: [facility] INVASIVE CV LAB;  Service: Cardiovascular;  Laterality: Bilateral;   UPPER EXTREMITY VENOGRAPHY N/A 04/24/2023    Procedure: UPPER EXTREMITY VENOGRAPHY;  Surgeon: Maeola Harman, MD;  Location: [facility] INVASIVE CV LAB;  Service: Cardiovascular;  Laterality: N/A;   VISCERAL ANGIOGRAPHY Right 06/17/2020    Procedure: CENTRAL VENO;  Surgeon: Leonie Douglas, MD;  Location: [facility] INVASIVE CV LAB;  Service: Cardiovascular;  Laterality: Right;          Social History         Socioeconomic History   Marital status: Single      Spouse name: Not on file   Number of children: Not on file   Years of education: Not on file   Highest education level: Not on file  Occupational History   Not on file  Tobacco Use   Smoking status: Never   Smokeless tobacco: Never  Vaping Use   Vaping status: Never Used  Substance and  Sexual Activity   Alcohol use: No   Drug use: Yes      Frequency: 2.0 times per week      Types: Marijuana      Comment: 1-2 times day   Sexual activity: Yes  Other Topics Concern   Not on file  Social History Narrative   Not on file    Social Drivers of Health        Financial Resource Strain: Not on file  Food Insecurity: No Food Insecurity (09/29/2022)    Hunger Vital Sign     Worried About Running Out of Food in the Last Year: Never true     Ran Out of Food in the Last Year: Never true  Transportation Needs: No Transportation Needs (09/29/2022)    PRAPARE - Therapist, art (Medical): No     Lack of Transportation (Non-Medical): No  Physical Activity: Insufficiently Active (07/07/2021)    Exercise Vital Sign     Days of Exercise per Week: 4 days     Minutes of Exercise per Session: 30 min  Stress: Stress Concern Present (08/06/2021)    [employer] of Occupational Health - Occupational Stress Questionnaire     Feeling of Stress : To some extent  Social Connections: Socially Isolated (06/07/2021)    Social Connection and Isolation Panel [NHANES]     Frequency of Communication with Friends and Family: More than three times a week     Frequency of Social Gatherings with Friends and Family: More than three times a week     Attends Religious Services: Never     Database administrator or Organizations: No     Attends Banker Meetings: Never     Marital Status: Never married  Intimate Partner Violence: Not At Risk (09/29/2022)    Humiliation, Afraid, Rape, and Kick questionnaire     Fear of Current or Ex-Partner: No     Emotionally Abused: No     Physically Abused: No     Sexually Abused: No  Family History  Problem Relation Age of Onset   Heart disease Mother     Hypertension Mother                  Current Outpatient Medications  Medication Sig Dispense Refill   ACCU-CHEK GUIDE test strip USE AS DIRECTED TWICE DAILY  100 each 0   Accu-Chek Softclix Lancets lancets Use as instructed 100 each 12   aspirin EC 81 MG tablet Take 1 tablet (81 mg total) by mouth daily. Swallow whole. 90 tablet 0   Blood Glucose Monitoring Suppl (ACCU-CHEK GUIDE ME) w/Device KIT 1 kit by Does not apply route daily. 1 kit 0   ELIQUIS 5 MG TABS tablet Take 1 tablet by mouth twice daily 60 tablet 0   gabapentin (NEURONTIN) 100 MG capsule TAKE 1 CAPSULE BY MOUTH THREE TIMES DAILY 90 capsule 0   LANTUS 100 UNIT/ML injection INJECT 5 UNITS SUBCUTANEOUSLY TWICE DAILY 10 mL 0   levothyroxine (SYNTHROID) 150 MCG tablet TAKE 1 TABLET BY MOUTH ONCE DAILY BEFORE BREAKFAST *APPOINTMENT NEEDED FOR FURTHER REFILLS* 15 tablet 0   multivitamin (RENA-VIT) TABS tablet Take 1 tablet by mouth at bedtime. 30 tablet 0   oxyCODONE (OXY IR/ROXICODONE) 5 MG immediate release tablet Take 1 tablet (5 mg total) by mouth every 6 (six) hours as needed for moderate pain (pain score 4-6) (pain score 4-6). 15 tablet 0   rosuvastatin (CRESTOR) 20 MG tablet Take 1 tablet by mouth once daily 90 tablet 0   Syringe, Disposable, (B-D SYRINGE SLIP TIP 30CC) 30 ML MISC 1 Syringe by Does not apply route daily. 30 each 11   ascorbic acid (VITAMIN C) 1000 MG tablet Take 1 tablet (1,000 mg total) by mouth daily. (Patient not taking: Reported on 05/09/2023) 30 tablet 0   calcium carbonate (TUMS - DOSED IN MG ELEMENTAL CALCIUM) 500 MG chewable tablet Chew 1,000 mg by mouth daily as needed for indigestion or heartburn. (Patient not taking: Reported on 05/09/2023)       cetirizine (ZYRTEC) 10 MG tablet Take 1 tablet (10 mg total) by mouth daily as needed for allergies. (Patient not taking: Reported on 05/09/2023) 30 tablet 0   ferric citrate (AURYXIA) 1 GM 210 MG(Fe) tablet Take 2-4 tablets (420-840 mg total) by mouth See admin instructions. Take 4 tablets (840 mg) by mouth with each meal & take 2 tablets (420 mg) by mouth with each snack. (Patient not taking: Reported on 05/09/2023) 270  tablet 0   gabapentin (NEURONTIN) 300 MG capsule Take 1 capsule by mouth at bedtime (Patient not taking: Reported on 05/09/2023) 30 capsule 0   midodrine (PROAMATINE) 5 MG tablet Take 1 tablet (5 mg total) by mouth Every Tuesday,Thursday,and Saturday with dialysis. (Patient not taking: Reported on 05/09/2023) 30 tablet 3   polyethylene glycol (MIRALAX / GLYCOLAX) 17 g packet Take 17 g by mouth daily as needed for mild constipation. (Patient not taking: Reported on 05/09/2023) 14 each 0   zinc sulfate 220 (50 Zn) MG capsule Take 1 capsule (220 mg total) by mouth daily. (Patient not taking: Reported on 05/09/2023) 14 capsule 0      No current facility-administered medications for this visit.        Allergies       Allergies  Allergen Reactions   Hydralazine Hcl Other (See Comments)      DRUG-INDUCED LUPUS          REVIEW OF SYSTEMS:    [X]  denotes positive finding, [ ]  denotes  negative finding Cardiac   Comments:  Chest pain or chest pressure:      Shortness of breath upon exertion:      Short of breath when lying flat:      Irregular heart rhythm:             Vascular      Pain in calf, thigh, or hip brought on by ambulation:      Pain in feet at night that wakes you up from your sleep:       Blood clot in your veins:      Leg swelling:              Pulmonary      Oxygen at home:      Productive cough:       Wheezing:              Neurologic      Sudden weakness in arms or legs:       Sudden numbness in arms or legs:       Sudden onset of difficulty speaking or slurred speech:      Temporary loss of vision in one eye:       Problems with dizziness:              Gastrointestinal      Blood in stool:       Vomited blood:              Genitourinary      Burning when urinating:       Blood in urine:             Psychiatric      Major depression:              Hematologic      Bleeding problems:      Problems with blood clotting too easily:             Skin       Rashes or ulcers:             Constitutional      Fever or chills:          PHYSICAL EXAMINATION:    Vitals:   08/01/23 1103  BP: 99/60  Pulse: (!) 110  Resp: 18  Temp: 98.1 F (36.7 C)  SpO2: 95%     General:  WDWN in NAD; vital signs documented above Gait: Not observed HENT: WNL, normocephalic Pulmonary: normal non-labored breathing , without Rales, rhonchi,  wheezing Cardiac: regular HR Abdomen: soft, NT, no masses Skin: without rashes Vascular Exam/Pulses: Palpable right radial pulse; soft thrill near the anastomosis in the right arm Extremities: without ischemic changes, without Gangrene , without cellulitis; without open wounds;  Musculoskeletal: no muscle wasting or atrophy       Neurologic: A&O X 3 Psychiatric:  The pt has Normal affect.     Non-Invasive Vascular Imaging:   Patent basilic vein fistula in the right arm about 4 mm in diameter       ASSESSMENT/PLAN:: 33 y.o. male status post right basilic vein fistula creation requiring brachial artery endarterectomy   Right hand is well-perfused with a palpable radial pulse and no signs or symptoms of steal syndrome.  The fistula has a soft thrill near the anastomosis on exam.  By duplex the diameter of the fistula is around 4 mm.  We will proceed with second stage basilic vein transposition.  Patient may require conversion to Artegraft  if the basilic vein does not appear healthy or mature enough intraoperatively.  Continue HD via right IJ TDC.  Risk benefits alternatives discussed with patient and he demonstrates good understanding and consent was signed.   Deairra Halleck C. Randie Heinz, MD [facility] [place] Office: [phone] Pager: [phone]

## 2023-08-01 NOTE — Anesthesia Procedure Notes (Signed)
 Procedure Name: Intubation Date/Time: 08/01/2023 12:53 PM  Performed by: Marquis Buggy, CRNAPre-anesthesia Checklist: Patient identified, Emergency Drugs available, Suction available, Patient being monitored and Timeout performed Patient Re-evaluated:Patient Re-evaluated prior to induction Oxygen Delivery Method: Circle system utilized Preoxygenation: Pre-oxygenation with 100% oxygen Induction Type: IV induction and Rapid sequence Laryngoscope Size: 4 and Mac Grade View: Grade I Tube type: Oral Tube size: 7.5 mm Number of attempts: 1 Airway Equipment and Method: Patient positioned with wedge pillow, Stylet and Video-laryngoscopy Placement Confirmation: ETT inserted through vocal cords under direct vision, positive ETCO2, CO2 detector and breath sounds checked- equal and bilateral Secured at: 24 cm Tube secured with: Tape Dental Injury: Teeth and Oropharynx as per pre-operative assessment  Comments: Recommend glide

## 2023-08-01 NOTE — Op Note (Signed)
 Patient name: Paul [name] MRN: [id number] DOB: 1990-12-26 Sex: male  08/01/2023 Pre-operative Diagnosis: End-stage renal disease Post-operative diagnosis:  Same Surgeon:  Apolinar Junes C. Randie Heinz, MD Assistant: Clinton Gallant, PA Procedure Performed:  Right upper arm interposition 4-7 mm PTFE AV graft placement  Indications: 33 year old male with history of end-stage renal disease currently dialyzing via catheter.  He has a history of multiple upper extremity accesses most recently a right upper arm basilic vein fistula which has been slow to mature.  He is now indicated for revision of fistula versus graft placement.  In experience assistant was necessary to facilitate exposure of the vein which unfortunately was only patent proximally and placement of new AV graft including tunneling and performing end-to-end anastomosis to the basilic vein fistula proximally as well as to the axillary vein and an end to end fashion.  Findings: There was significant venous hypertension throughout the right upper arm.  The basilic vein fistula itself was difficult to identify by preoperative ultrasound we were able to find the proximal anastomosis and sew the graft into and there as well end to end to and left axillary vein.  At completion there was a very strong thrill in the graft confirmed with Doppler in the axillary vein and also a 1+ palpable right radial pulse confirmed with Doppler.  Patient will be high risk for graft thrombosis given the difficulty in placement of his graft and also will have difficulty with wound healing particularly towards the antecubitum and this was discussed with family at completion and if the graft is thrombosed or has difficulty with wounds requiring graft removal he would be catheter dependent for prolonged period of time.   Procedure:  The patient was identified in the holding area and taken to the operating room where he was placed by operative when general anesthesia was induced.   He was sterilely prepped and draped in the right upper extremity in the usual fashion, antibiotics were administered and a timeout was called.  We used ultrasound to evaluate the upper arm basilic vein which did not appear to be a fistula more towards the upper arm except at the site of the previous incision which did have some wound breakdown.  We then opened the previous incision above the antecubital and there was significant venous hypertension with pulsatile venous bleeding that took several minutes to control with cautery.  Ultimately we were able to use ultrasound and palpation to get down to the proximal fistula.  I dissected out the proximal fistula and this did not appear to go far into the vein at all and ultimately I transected this and spatulated the proximal fistula was back to the arterial anastomosis.  I flushed this with heparinized saline.  I then used ultrasound identified what appeared to be a compressible axillary vein and dissected down through the previous incision site identified an old AV graft and this was tied off and excised on both ends.  I dissected down to identify the axillary vein and then transected this tied off more peripherally on the arm with heparinized saline margin for orientation and then tunneled 4-7 mm AV graft.  There was a 7 mm and I spatulated the vein in the axilla and sewed this and to end with 6-0 Prolene suture.  Prior to completion we then flushed through the graft itself and clamped the graft.  Towards the brachial artery at the end trimmed the graft to size.  I released the clamp along the existing fistula and there  was no blood flow so 3 Fogarty was passed which then there was very strong blood flow in the fistula was reclamped.  There was a palpable radial pulse at this time so I did not feel the need to pass the Fogarty distally.  I then sewed the graft and to end with 6-0 Prolene suture.  Prior to completion again I passed the 3 Fogarty and had very strong  flow through the graft.  This was confirmed with Doppler in the axillary vein and there was strong radial artery signal with a weakly palpable right radial pulse and also strong ulnar signal at the hand.  We irrigated the wounds and meticulously obtained hemostasis which was very difficult particularly in the antecubital incision given the high venous pressure.  Ultimately we were able to close a deeper layer in both incisions with interrupted Vicryl suture and then stapled both incisions.  A sterile dressing was applied followed by a gentle Ace wrap in the upper arm.  He was then awakened from anesthesia having tolerated procedure without any complication.  All counts were correct at completion.  EBL: 250cc   Navaeh Kehres C. Randie Heinz, MD [facility] [place] Office: [phone] Pager: [phone]

## 2023-08-01 NOTE — Anesthesia Preprocedure Evaluation (Addendum)
 Anesthesia Evaluation  Patient identified by MRN, date of birth, ID band Patient awake    Reviewed: Allergy & Precautions, NPO status , Patient's Chart, lab work & pertinent test results  Airway Mallampati: III  TM Distance: >3 FB Neck ROM: Full    Dental  (+) Dental Advisory Given   Pulmonary neg pulmonary ROS   breath sounds clear to auscultation       Cardiovascular hypertension, Pt. on medications + Peripheral Vascular Disease and + DVT   Rhythm:Regular Rate:Normal     Neuro/Psych negative neurological ROS     GI/Hepatic Neg liver ROS,GERD  ,,  Endo/Other  diabetes, Type 2, Insulin DependentHypothyroidism  Class 3 obesity  Renal/GU ESRF and DialysisRenal disease     Musculoskeletal   Abdominal   Peds  Hematology  (+) Blood dyscrasia, anemia   Anesthesia Other Findings   Reproductive/Obstetrics                              Anesthesia Physical Anesthesia Plan  ASA: 4  Anesthesia Plan: General   Post-op Pain Management: Tylenol PO (pre-op)*   Induction: Intravenous  PONV Risk Score and Plan: 2 and Dexamethasone, Ondansetron and Treatment may vary due to age or medical condition  Airway Management Planned: Oral ETT and Video Laryngoscope Planned  Additional Equipment: None  Intra-op Plan:   Post-operative Plan: Extubation in OR  Informed Consent: I have reviewed the patients History and Physical, chart, labs and discussed the procedure including the risks, benefits and alternatives for the proposed anesthesia with the patient or authorized representative who has indicated his/her understanding and acceptance.     Dental advisory given  Plan Discussed with: CRNA  Anesthesia Plan Comments:          Anesthesia Quick Evaluation

## 2023-08-01 NOTE — Discharge Instructions (Signed)

## 2023-08-02 ENCOUNTER — Encounter (HOSPITAL_COMMUNITY): Payer: Self-pay | Admitting: Vascular Surgery

## 2023-08-02 NOTE — Anesthesia Postprocedure Evaluation (Signed)
 Anesthesia Post Note  Patient: Paul [name]  Procedure(s) Performed: RIGHT UPPER ARM FISTULA LIGATION (Right: Arm Upper) INSERTION, GRAFT, ARTERIOVENOUS, RIGHT UPPER EXTREMITY USING GORE TEX (Right: Arm Upper)     Patient location during evaluation: PACU Anesthesia Type: General Level of consciousness: awake and alert Pain management: pain level controlled Vital Signs Assessment: post-procedure vital signs reviewed and stable Respiratory status: spontaneous breathing, nonlabored ventilation, respiratory function stable and patient connected to nasal cannula oxygen Cardiovascular status: blood pressure returned to baseline and stable Postop Assessment: no apparent nausea or vomiting Anesthetic complications: no   No notable events documented.  Last Vitals:  Vitals:   08/01/23 1545 08/01/23 1600  BP: 126/86 128/75  Pulse: 96 94  Resp: 15 12  Temp:  36.7 C  SpO2: 100% 100%    Last Pain:  Vitals:   08/01/23 1547  TempSrc:   PainSc: 6                  Kennieth Rad

## 2023-08-08 ENCOUNTER — Other Ambulatory Visit (HOSPITAL_COMMUNITY): Payer: Self-pay

## 2023-08-08 ENCOUNTER — Telehealth: Payer: Self-pay

## 2023-08-08 ENCOUNTER — Other Ambulatory Visit: Payer: Self-pay | Admitting: Physician Assistant

## 2023-08-08 MED ORDER — OXYCODONE-ACETAMINOPHEN 5-325 MG PO TABS: 1.0000 | ORAL_TABLET | Freq: Four times a day (QID) | ORAL | 0 refills | Status: DC | PRN

## 2023-08-08 NOTE — Telephone Encounter (Signed)
 Medication Refill: -pt LM requesting a medication refill and had a question about his arm -pt stated his arm is still hurting and draining clear-reddish liquid and he is using  a little guaze wrap that he only has to change 1 x per day.  He denies any worsening with swelling and he is elevating as much as he can, overall doing ok.  -PA consulted and limited pain med refill ordered  -pt advised refills are rarely issued and to use pain medicine sparingly-only taking it if absolutely needed.  -pt confirms understanding

## 2023-08-09 ENCOUNTER — Encounter: Payer: Self-pay | Admitting: Family

## 2023-08-09 ENCOUNTER — Ambulatory Visit: Admitting: Family

## 2023-08-09 DIAGNOSIS — Z89432 Acquired absence of left foot: Secondary | ICD-10-CM

## 2023-08-09 DIAGNOSIS — L97521 Non-pressure chronic ulcer of other part of left foot limited to breakdown of skin: Secondary | ICD-10-CM | POA: Diagnosis not present

## 2023-08-09 NOTE — Progress Notes (Signed)
 Office Visit Note   Patient: Paul [name]           Date of Birth: [birth year]           MRN: [id number] Visit Date: 08/09/2023              Requested by: Darral Dash, DO [address] 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  this has been slow to heal after an area of dehiscence.  He has been in regular shoewear and is seen routinely to have the hyperkeratotic tissue debrided.    Assessment & Plan: Visit Diagnoses: No diagnosis found.  Plan: Continue with daily Dial soap cleansing.  Dry dressings.  Follow-Up Instructions: No follow-ups on file.   Ortho Exam  Patient is alert, oriented, no adenopathy, well-dressed, normal affect, normal respiratory effort. On examination left foot the first ray amputation continues to have buildup of hyperkeratotic tissue.  This is debrided with a 10 blade knife back to viable tissue.  The remaining open area is a fissure now 1 cm x 1 mm with 3 mm of depth there is no surrounding erythema or warmth scant bloody drainage.  There is no concerning sign  Imaging: No results found. No images are attached to the encounter.  Labs: Lab Results  Component Value Date   HGBA1C 7.4 (H) 09/30/2022   HGBA1C 6.3 (H) 04/18/2022   HGBA1C 6.2 09/22/2021   REPTSTATUS 10/06/2022 FINAL 10/01/2022   GRAMSTAIN  10/01/2022    FEW WBC PRESENT, PREDOMINANTLY PMN RARE GRAM POSITIVE COCCI RARE SQUAMOUS EPITHELIAL CELLS PRESENT    CULT  10/01/2022    RARE ESCHERICHIA COLI RARE STENOTROPHOMONAS MALTOPHILIA NO ANAEROBES ISOLATED Performed at [facility], [address], [place], [place] [address]    [facility] ESCHERICHIA COLI 10/01/2022   LABORGA STENOTROPHOMONAS MALTOPHILIA 10/01/2022     Lab Results  Component Value Date   ALBUMIN 3.3 (L) 09/30/2022    ALBUMIN 4.4 09/29/2022   ALBUMIN 3.7 05/05/2022    Lab Results  Component Value Date   MG 2.8 (H) 09/26/2016   MG 2.8 (H) 09/25/2016   MG 2.8 (H) 09/24/2016   Lab Results  Component Value Date   VD25OH 6.8 (L) 09/14/2016    No results found for: "PREALBUMIN"    Latest Ref Rng & Units 08/01/2023   11:50 AM 05/12/2023    7:32 AM 04/24/2023    9:35 AM  CBC EXTENDED  Hemoglobin 13.0 - 17.0 g/dL 36.6  44.0  34.7   HCT 39.0 - 52.0 % 44.0  46.0  46.0      There is no height or weight on file to calculate BMI.  Orders:  No orders of the defined types were placed in this encounter.  No orders of the defined types were placed in this encounter.    Procedures: No procedures performed  Clinical Data: No additional findings.  ROS:  All other systems negative, except as noted in the HPI. Review of Systems  Objective: Vital Signs: There were no vitals taken for this visit.  Specialty Comments:  No specialty comments available.  PMFS History: Patient Active Problem List   Diagnosis Date Noted   Acute osteomyelitis of left foot (HCC) 09/30/2022   Sepsis (HCC) 09/29/2022   Occlusion of  left brachial artery (HCC) 05/04/2022   Thrombus 05/04/2022   Ischemia of left upper extremity 05/04/2022   Complication of vascular access for dialysis 04/19/2022   ESRD (end stage renal disease) (HCC) 04/19/2022   Failure of hemodialysis access (HCC) 04/18/2022   Left hand pain 09/22/2021   Diabetic foot infection (HCC)    Wound infection 05/07/2021   MSSA bacteremia 05/07/2021   Osteomyelitis (HCC) 05/07/2021   Abscess 05/07/2021   Sepsis due to cellulitis (HCC) 05/06/2021   Poor compliance with medication 03/17/2021   Fluid overload, unspecified 07/09/2020   Complication of vascular dialysis catheter 03/23/2020   Other disorders of phosphorus metabolism 01/28/2020   Hypertension 01/08/2020   Hypocalcemia 10/29/2019   ESRD on dialysis (HCC) 11/16/2017   Pruritus, unspecified  04/04/2017   Unspecified protein-calorie malnutrition (HCC) 11/11/2016   Headache, unspecified 10/29/2016   Encounter for immunization 10/19/2016   Iron deficiency anemia, unspecified 10/10/2016   Anemia in chronic kidney disease 09/30/2016   Coagulation defect, unspecified (HCC) 09/30/2016   Pneumonia due to Pseudomonas (HCC) 09/30/2016   Secondary hyperparathyroidism of renal origin (HCC) 09/30/2016   Hypothyroidism 10/29/2009   Type 2 diabetes mellitus (HCC) 04/14/2009   Hyperlipidemia 04/14/2009   OBESITY, MORBID 04/14/2009   Essential hypertension, benign 04/14/2009   Past Medical History:  Diagnosis Date   Acute hypoxemic respiratory failure (HCC) 09/11/2016   Anemia    Chronic kidney disease    ARF on CRF Dialysis T/TH/Sa   Diabetes mellitus    Type II   End stage renal disease on dialysis (HCC)    [place]    GERD (gastroesophageal reflux disease)    diet controlled   HLD (hyperlipidemia)    Hypertension    Hypothyroidism    Morbid obesity (HCC)    Sacral wound    resolved   Thyroid disease     Family History  Problem Relation Age of Onset   Heart disease Mother    Hypertension Mother     Past Surgical History:  Procedure Laterality Date   AMPUTATION Left 05/08/2021   Procedure: AMPUTATION GREAT TOE;  Surgeon: Nadara Mustard, MD;  Location: [facility] OR;  Service: Orthopedics;  Laterality: Left;   AMPUTATION Left 10/01/2022   Procedure: LEFT FOOT AMPUTATION RAY 1st;  Surgeon: Nadara Mustard, MD;  Location: [facility] OR;  Service: Orthopedics;  Laterality: Left;   AV FISTULA PLACEMENT Left 09/26/2016   Procedure: LEFT UPPER ARM ARTERIOVENOUS (AV) FISTULA CREATION;  Surgeon: Sherren Kerns, MD;  Location: [facility] OR;  Service: Vascular;  Laterality: Left;   AV FISTULA PLACEMENT Left 12/02/2016   Procedure: INSERTION OF ARTERIOVENOUS GORE-TEX GRAFT LEFT UPPER  ARM USING A 4-7MM BY 45CM GRAFT ;  Surgeon: Larina Earthly, MD;  Location: [facility] OR;  Service: Vascular;  Laterality:  Left;   AV FISTULA PLACEMENT Right 07/03/2020   Procedure: INSERTION OF ARTERIOVENOUS (AV) GORE-TEX GRAFT RIGHT UPPER ARM;  Surgeon: Cephus Shelling, MD;  Location: [facility] OR;  Service: Vascular;  Laterality: Right;   AV FISTULA PLACEMENT Left 07/02/2021   Procedure: LEFT UPPER EXTREMITY ARTERIOVENOUS FISTULA CREATION;  Surgeon: Maeola Harman, MD;  Location: [facility] OR;  Service: Vascular;  Laterality: Left;   AV FISTULA PLACEMENT Right 05/12/2023   Procedure: RIGHT UPPER EXTREMITY ARTERIOVENOUS (AV) FISTULA CREATION;  Surgeon: Maeola Harman, MD;  Location: [facility] OR;  Service: Vascular;  Laterality: Right;   BASCILIC VEIN TRANSPOSITION Right 05/06/2020   Procedure: FIRST STAGE RIGHT BASCILIC VEIN TRANSPOSITION;  Surgeon: Myra Gianotti,  Fran Lowes, MD;  Location: [facility] OR;  Service: Vascular;  Laterality: Right;   BASCILIC VEIN TRANSPOSITION Left 08/20/2021   Procedure: LEFT ARM SECOND STAGE WITH ARTERIOVENOUS GRAFT;  Surgeon: Maeola Harman, MD;  Location: [facility] OR;  Service: Vascular;  Laterality: Left;   BASCILIC VEIN TRANSPOSITION Right 08/01/2023   Procedure: RIGHT UPPER ARM FISTULA LIGATION;  Surgeon: Maeola Harman, MD;  Location: [facility] OR;  Service: Vascular;  Laterality: Right;   DIALYSIS/PERMA CATHETER INSERTION N/A 06/21/2023   Procedure: DIALYSIS/PERMA CATHETER INSERTION;  Surgeon: Ethelene Hal, MD;  [facility]: [facility] INVASIVE CV LAB;  Service: Cardiovascular;  Laterality: N/A;   DIALYSIS/PERMA CATHETER REMOVAL N/A 06/21/2023   Procedure: DIALYSIS/PERMA CATHETER REMOVAL;  Surgeon: Ethelene Hal, MD;  Location: [facility] INVASIVE CV LAB;  Service: Cardiovascular;  Laterality: N/A;   EXCHANGE OF A DIALYSIS CATHETER Right 10/08/2016   Procedure: EXCHANGE OF A DIALYSIS CATHETER-RIGHT INTERNAL JUGULAR;  Surgeon: Fransisco Hertz, MD;  Location: [facility] OR;  Service: Vascular;  Laterality: Right;   INSERTION OF ARTERIOVENOUS (AV) ARTEGRAFT ARM Right 08/01/2023   Procedure: INSERTION, GRAFT,  ARTERIOVENOUS, RIGHT UPPER EXTREMITY USING GORE TEX;  Surgeon: Maeola Harman, MD;  Location: [facility] OR;  Service: Vascular;  Laterality: Right;   INSERTION OF DIALYSIS CATHETER Right 09/26/2016   Procedure: INSERTION OF DIALYSIS CATHETER - Right Internal Jugular Placement;  Surgeon: Sherren Kerns, MD;  Location: [facility] OR;  Service: Vascular;  Laterality: Right;   INSERTION OF DIALYSIS CATHETER Left 03/22/2018   Procedure: INSERTION OF DIALYSIS CATHETER;  Surgeon: Cephus Shelling, MD;  Location: [facility] OR;  Service: Vascular;  Laterality: Left;   INTRAOPERATIVE ARTERIOGRAM Left 05/04/2022   Procedure: INTRA OPERATIVE ARTERIOGRAM;  Surgeon: Victorino Sparrow, MD;  Location: [facility] OR;  Service: Vascular;  Laterality: Left;   IR AV DIALY SHUNT INTRO NEEDLE/INTRAC INITIAL W/PTA/STENT/IMG LT Left 02/17/2023   IR AV DIALY SHUNT INTRO NEEDLE/INTRACATH INITIAL W/PTA/IMG LEFT  08/19/2022   IR AV DIALY SHUNT INTRO NEEDLE/INTRACATH INITIAL W/PTA/IMG LEFT  11/14/2022   IR DIALY SHUNT INTRO NEEDLE/INTRACATH INITIAL W/IMG LEFT Left 04/15/2022   IR FLUORO GUIDE CV LINE LEFT  04/19/2022   IR FLUORO GUIDE CV LINE LEFT  05/30/2022   IR FLUORO GUIDE CV LINE LEFT  03/24/2023   IR FLUORO GUIDE CV LINE RIGHT  03/26/2021   IR FLUORO GUIDE CV LINE RIGHT  05/13/2021   IR FLUORO GUIDE CV LINE RIGHT  05/04/2022   IR FLUORO GUIDE CV LINE RIGHT  02/17/2023   IR REMOVAL TUN CV CATH W/O FL  05/11/2021   IR REMOVAL TUN CV CATH W/O FL  10/06/2021   IR THROMBECTOMY AV FISTULA W/THROMBOLYSIS/PTA INC/SHUNT/IMG LEFT Left 02/02/2022   IR THROMBECTOMY AV FISTULA W/THROMBOLYSIS/PTA INC/SHUNT/IMG LEFT Left 03/22/2022   IR THROMBECTOMY AV FISTULA W/THROMBOLYSIS/PTA INC/SHUNT/IMG LEFT Left 05/04/2022   IR THROMBECTOMY AV FISTULA W/THROMBOLYSIS/PTA INC/SHUNT/IMG LEFT Left 12/09/2022   IR THROMBECTOMY AV FISTULA W/THROMBOLYSIS/PTA/STENT INC/SHUNT/IMG LT Left 04/27/2022   IR US GUIDE VASC ACCESS LEFT  02/02/2022   IR US GUIDE VASC ACCESS LEFT   03/22/2022   IR US GUIDE VASC ACCESS LEFT  04/15/2022   IR US GUIDE VASC ACCESS LEFT  04/19/2022   IR US GUIDE VASC ACCESS LEFT  04/27/2022   IR US GUIDE VASC ACCESS LEFT  12/13/2022   IR US GUIDE VASC ACCESS LEFT  02/21/2023   IR US GUIDE VASC ACCESS RIGHT  03/26/2021   IR US GUIDE VASC ACCESS RIGHT  05/13/2021   IR  US GUIDE VASC ACCESS RIGHT  05/04/2022   IR US GUIDE VASC ACCESS RIGHT  02/17/2023   PERIPHERAL VASCULAR BALLOON ANGIOPLASTY  06/21/2023   Procedure: PERIPHERAL VASCULAR BALLOON ANGIOPLASTY;  Surgeon: Ethelene Hal, MD;  Location: [facility] INV[facility] LAB;  Service: Cardiovascular;;  Innominate Vein   REVISON OF ARTERIOVENOUS FISTULA Left 03/22/2018   Procedure: LEFT UPPER EXTREMITY ARTERIOVENOUS  REVISION WITH GORE-TEX GRAFT.;  Surgeon: Cephus Shelling, MD;  Location: [facility] OR;  Service: Vascular;  Laterality: Left;   TEE WITHOUT CARDIOVERSION N/A 05/12/2021   Procedure: TRANSESOPHAGEAL ECHOCARDIOGRAM (TEE);  Surgeon: Christell Constant, MD;  Location: [facility] ENDOSCOPY;  Service: Cardiovascular;  Laterality: N/A;   THROMBECTOMY Left 08/26/2019   thrombectomy of LUA loop AVG   THROMBECTOMY AND REVISION OF ARTERIOVENTOUS (AV) GORETEX  GRAFT Right 07/27/2020   Procedure: INSERTION OF RIGHT ARM LOOP GRAFT WITH EXCISION OF RIGHT ARM BRACHIAL AXILLARY GRAFT;  Surgeon: Cephus Shelling, MD;  Location: [facility] OR;  Service: Vascular;  Laterality: Right;   THROMBECTOMY BRACHIAL ARTERY Left 05/04/2022   Procedure: LEFT ARM RADIAL BRACHIAL ARTERY EMBOLECTOMY;  Surgeon: Victorino Sparrow, MD;  Location: [facility] OR;  Service: Vascular;  Laterality: Left;   UPPER EXTREMITY VENOGRAPHY Bilateral 06/14/2021   Procedure: UPPER EXTREMITY VENOGRAPHY;  Surgeon: Maeola Harman, MD;  Location: [facility] INVASIVE CV LAB;  Service: Cardiovascular;  Laterality: Bilateral;   UPPER EXTREMITY VENOGRAPHY N/A 04/24/2023   Procedure: UPPER EXTREMITY VENOGRAPHY;  Surgeon: Maeola Harman, MD;  Location: [facility] INVASIVE  CV LAB;  Service: Cardiovascular;  Laterality: N/A;   VISCERAL ANGIOGRAPHY Right 06/17/2020   Procedure: CENTRAL VENO;  Surgeon: Leonie Douglas, MD;  Location[facility] INVASIVE CV LAB;  Service: Cardiovascular;  Laterality: Right;   Social History   Occupational History   Not on file  Tobacco Use   Smoking status: Never   Smokeless tobacco: Never  Vaping Use   Vaping status: Never Used  Substance and Sexual Activity   Alcohol use: No   Drug use: Yes    Frequency: 2.0 times per week    Types: Marijuana    Comment: 1-2 times day   Sexual activity: Yes

## 2023-08-15 ENCOUNTER — Telehealth: Payer: Self-pay

## 2023-08-15 NOTE — Telephone Encounter (Signed)
 Returned pt's call regarding his c/o his incision still bleeding. He feels it has improved slightly since last week but is still concerning him. He wants to be seen and has been scheduled for tomorrow with MD.

## 2023-08-16 ENCOUNTER — Other Ambulatory Visit: Payer: Self-pay

## 2023-08-16 ENCOUNTER — Encounter: Payer: Self-pay | Admitting: Vascular Surgery

## 2023-08-16 ENCOUNTER — Encounter (HOSPITAL_COMMUNITY): Payer: Self-pay | Admitting: Vascular Surgery

## 2023-08-16 ENCOUNTER — Ambulatory Visit (INDEPENDENT_AMBULATORY_CARE_PROVIDER_SITE_OTHER): Admitting: Vascular Surgery

## 2023-08-16 VITALS — BP 161/88 | HR 98 | Temp 97.8°F | Ht 74.0 in | Wt 350.6 lb

## 2023-08-16 DIAGNOSIS — N186 End stage renal disease: Secondary | ICD-10-CM

## 2023-08-16 NOTE — Progress Notes (Signed)
     Subjective:     Patient ID: Paul [name], male   DOB: 17-Oct-1990, 33 y.o.   MRN: [id number]  HPI 33 year old male recently underwent right upper extremity interposition PTFE graft placement.  During the surgery was noted that he had significant venous hypertension as there was bleeding from multiple small veins and there was significant blood loss.  There was also a very small vein for which to sew the graft into in the axilla and his skin was very unhealthy and there was concern for wound healing.  Staples were placed at that time.  He is now here for breakdown of the antecubital wound although the axillary wound is healing well.  He denies fevers or chills.  He continues on dialysis via [facility].  Patient is anticoagulated   Review of Systems As above    Objective:   Physical Exam Vitals:   08/16/23 1016  BP: (!) 161/88  Pulse: 98  Temp: 97.8 F (36.6 C)  SpO2: 98%    Awake alert oriented Nonlabored respirations No thrill in right upper arm AV graft Right antecubital incision with 2 areas of 1-1/2 cm breakdown packed with wet-to-dry dressing today    Assessment/plan     33 year old male with end-stage renal disease now status post right upper arm AV interposition graft.  Unfortunately graft is thrombosed and there is breakdown of the wound near the antecubitum.  Plan will be for washout possible excision of the graft in the OR tomorrow which is his dialysis day.  He will possibly require wound VAC and admission to the hospital.  We will hold anticoagulation prior to procedure and he will need to be n.p.o. in the morning.  All questions were answered he demonstrates good understanding.    Johanna Stafford C. Randie Heinz, MD [facility] Office: [phone] Pager: [phone]

## 2023-08-16 NOTE — H&P (View-Only) (Signed)
     Subjective:     Patient ID: Paul [name], male   DOB: 17-Oct-1990, 33 y.o.   MRN: [id number]  HPI 33 year old male recently underwent right upper extremity interposition PTFE graft placement.  During the surgery was noted that he had significant venous hypertension as there was bleeding from multiple small veins and there was significant blood loss.  There was also a very small vein for which to sew the graft into in the axilla and his skin was very unhealthy and there was concern for wound healing.  Staples were placed at that time.  He is now here for breakdown of the antecubital wound although the axillary wound is healing well.  He denies fevers or chills.  He continues on dialysis via [facility].  Patient is anticoagulated   Review of Systems As above    Objective:   Physical Exam Vitals:   08/16/23 1016  BP: (!) 161/88  Pulse: 98  Temp: 97.8 F (36.6 C)  SpO2: 98%    Awake alert oriented Nonlabored respirations No thrill in right upper arm AV graft Right antecubital incision with 2 areas of 1-1/2 cm breakdown packed with wet-to-dry dressing today    Assessment/plan     33 year old male with end-stage renal disease now status post right upper arm AV interposition graft.  Unfortunately graft is thrombosed and there is breakdown of the wound near the antecubitum.  Plan will be for washout possible excision of the graft in the OR tomorrow which is his dialysis day.  He will possibly require wound VAC and admission to the hospital.  We will hold anticoagulation prior to procedure and he will need to be n.p.o. in the morning.  All questions were answered he demonstrates good understanding.    Johanna Stafford C. Randie Heinz, MD [facility] Office: [phone] Pager: [phone]

## 2023-08-16 NOTE — Progress Notes (Addendum)
 PCP - Darral Dash, DO  Cardiologist -   PPM/ICD - denies Device Orders - n/a Rep Notified - n/a  Chest x-ray - 07-30-22 EKG - 09-29-22 Stress Test - 03-06-20 CE ECHO - denies Cardiac Cath - denies  CPAP - denies  GLP-1 -denies  Fasting Blood Sugar - Per patient around 120-130. Checks Blood Sugar BID  Blood Thinner Instructions: Eliquis per patient hold pm dose tonight and am dose 08-17-26 Aspirin Instructions: continue  ERAS Protcol - NPO  COVID TEST- N/A  Anesthesia review: yes Hx, DM HTN, EKD  Patient verbally denies any shortness of breath, fever, cough and chest pain during phone call   -------------  SDW INSTRUCTIONS given:  Your procedure is scheduled on March 27,2025.  Report to [facility] Main Entrance "A" at 11:00 A.M., and check in at the Admitting office.  Call this number if you have problems the morning of surgery:  [phone]   Remember:  Do not eat or drink after midnight the night before your surgery      Take these medicines the morning of surgery with A SIP OF WATER  gabapentin (NEURONTIN)  levothyroxine (SYNTHROID)  oxyCODONE-acetaminophen (PERCOCET/ROXICET)  rosuvastatin (CRESTOR)     As of today, STOP taking any Aspirin (unless otherwise instructed by your surgeon) Aleve, Naproxen, Ibuprofen, Motrin, Advil, Goody's, BC's, all herbal medications, fish oil, and all vitamins. WHAT DO I DO ABOUT MY DIABETES MEDICATION?   Do not take oral diabetes medicines (pills) the morning of surgery.  THE NIGHT BEFORE SURGERY, take 1/2 of Lantus dose dinner time per patient he takes between 5-8 units depending on results of blood sugar      THE MORNING OF SURGERY, take _(50%)1/2 of Lantus dose breakfast time per patient he takes between 5-8 units depending on results of blood sugar  The day of surgery, do not take other diabetes injectables, including Byetta (exenatide), Bydureon (exenatide ER), Victoza (liraglutide), or Trulicity  (dulaglutide).  If your CBG is greater than 220 mg/dL, you may take  of your sliding scale (correction) dose of insulin.   HOW TO MANAGE YOUR DIABETES BEFORE AND AFTER SURGERY  Why is it important to control my blood sugar before and after surgery? Improving blood sugar levels before and after surgery helps healing and can limit problems. A way of improving blood sugar control is eating a healthy diet by:  Eating less sugar and carbohydrates  Increasing activity/exercise  Talking with your doctor about reaching your blood sugar goals High blood sugars (greater than 180 mg/dL) can raise your risk of infections and slow your recovery, so you will need to focus on controlling your diabetes during the weeks before surgery. Make sure that the doctor who takes care of your diabetes knows about your planned surgery including the date and location.  How do I manage my blood sugar before surgery? Check your blood sugar at least 4 times a day, starting 2 days before surgery, to make sure that the level is not too high or low.  Check your blood sugar the morning of your surgery when you wake up and every 2 hours until you get to the Short Stay unit.  If your blood sugar is less than 70 mg/dL, you will need to treat for low blood sugar: Do not take insulin. Treat a low blood sugar (less than 70 mg/dL) with  cup of clear juice (cranberry or apple), 4 glucose tablets, OR glucose gel. Recheck blood sugar in 15 minutes after treatment (to make  sure it is greater than 70 mg/dL). If your blood sugar is not greater than 70 mg/dL on recheck, call [phone] for further instructions. Report your blood sugar to the short stay nurse when you get to Short Stay.  If you are admitted to the hospital after surgery: Your blood sugar will be checked by the staff and you will probably be given insulin after surgery (instead of oral diabetes medicines) to make sure you have good blood sugar levels. The goal for  blood sugar control after surgery is 80-180 mg/dL.                      Do not wear jewelry, make up, or nail polish            Do not wear lotions, powders, perfumes/colognes, or deodorant.            Do not shave 48 hours prior to surgery.  Men may shave face and neck.            Do not bring valuables to the hospital.            [facility] is not responsible for any belongings or valuables.  Do NOT Smoke (Tobacco/Vaping) 24 hours prior to your procedure If you use a CPAP at night, you may bring all equipment for your overnight stay.   Contacts, glasses, dentures or bridgework may not be worn into surgery.      For patients admitted to the hospital, discharge time will be determined by your treatment team.   Patients discharged the day of surgery will not be allowed to drive home, and someone needs to stay with them for 24 hours.    Special instructions:   [place]- Preparing For Surgery  Before surgery, you can play an important role. Because skin is not sterile, your skin needs to be as free of germs as possible. You can reduce the number of germs on your skin by washing with CHG (chlorahexidine gluconate) Soap before surgery.  CHG is an antiseptic cleaner which kills germs and bonds with the skin to continue killing germs even after washing.    Oral Hygiene is also important to reduce your risk of infection.  Remember - BRUSH YOUR TEETH THE MORNING OF SURGERY WITH YOUR REGULAR TOOTHPASTE  Please do not use if you have an allergy to CHG or antibacterial soaps. If your skin becomes reddened/irritated stop using the CHG.  Do not shave (including legs and underarms) for at least 48 hours prior to first CHG shower. It is OK to shave your face.  Please follow these instructions carefully.   Shower the NIGHT BEFORE SURGERY and the MORNING OF SURGERY with DIAL Soap.   Pat yourself dry with a CLEAN TOWEL.  Wear CLEAN PAJAMAS to bed the night before surgery  Place CLEAN SHEETS on  your bed the night of your first shower and DO NOT SLEEP WITH PETS.   Day of Surgery: Please shower morning of surgery  Wear Clean/Comfortable clothing the morning of surgery Do not apply any deodorants/lotions.   Remember to brush your teeth WITH YOUR REGULAR TOOTHPASTE.   Questions were answered. Patient verbalized understanding of instructions.

## 2023-08-17 ENCOUNTER — Encounter (HOSPITAL_COMMUNITY)
Admission: RE | Disposition: A | Discharge status: Home Health | Payer: Self-pay | Source: Ambulatory Visit | Attending: Vascular Surgery

## 2023-08-17 ENCOUNTER — Observation Stay (HOSPITAL_COMMUNITY)
Admission: RE | Admit: 2023-08-17 | Discharge: 2023-08-18 | Disposition: A | Discharge status: Home Health | Source: Ambulatory Visit | Attending: Vascular Surgery | Admitting: Vascular Surgery

## 2023-08-17 ENCOUNTER — Encounter (HOSPITAL_COMMUNITY): Payer: Self-pay | Admitting: Vascular Surgery

## 2023-08-17 ENCOUNTER — Ambulatory Visit (HOSPITAL_COMMUNITY): Payer: Self-pay | Admitting: Physician Assistant

## 2023-08-17 ENCOUNTER — Other Ambulatory Visit: Payer: Self-pay | Admitting: Student

## 2023-08-17 ENCOUNTER — Other Ambulatory Visit: Payer: Self-pay

## 2023-08-17 DIAGNOSIS — T827XXA Infection and inflammatory reaction due to other cardiac and vascular devices, implants and grafts, initial encounter: Secondary | ICD-10-CM

## 2023-08-17 DIAGNOSIS — D631 Anemia in chronic kidney disease: Secondary | ICD-10-CM | POA: Diagnosis not present

## 2023-08-17 DIAGNOSIS — N186 End stage renal disease: Secondary | ICD-10-CM

## 2023-08-17 DIAGNOSIS — Y828 Other medical devices associated with adverse incidents: Secondary | ICD-10-CM | POA: Insufficient documentation

## 2023-08-17 DIAGNOSIS — I120 Hypertensive chronic kidney disease with stage 5 chronic kidney disease or end stage renal disease: Secondary | ICD-10-CM

## 2023-08-17 DIAGNOSIS — E1122 Type 2 diabetes mellitus with diabetic chronic kidney disease: Secondary | ICD-10-CM | POA: Insufficient documentation

## 2023-08-17 DIAGNOSIS — Z992 Dependence on renal dialysis: Secondary | ICD-10-CM | POA: Insufficient documentation

## 2023-08-17 DIAGNOSIS — E039 Hypothyroidism, unspecified: Secondary | ICD-10-CM | POA: Insufficient documentation

## 2023-08-17 DIAGNOSIS — Z794 Long term (current) use of insulin: Secondary | ICD-10-CM

## 2023-08-17 HISTORY — PX: WOUND DEBRIDEMENT: SHX247

## 2023-08-17 HISTORY — PX: APPLICATION OF WOUND VAC: SHX5189

## 2023-08-17 HISTORY — PX: AVGG REMOVAL: SHX5153

## 2023-08-17 HISTORY — DX: Other complications of anesthesia, initial encounter: T88.59XA

## 2023-08-17 LAB — COMPREHENSIVE METABOLIC PANEL WITH GFR
ALT: 11 U/L (ref 0–44)
AST: 22 U/L (ref 15–41)
Albumin: 4 g/dL (ref 3.5–5.0)
Alkaline Phosphatase: 54 U/L (ref 38–126)
Anion gap: 16 — ABNORMAL HIGH (ref 5–15)
BUN: 32 mg/dL — ABNORMAL HIGH (ref 6–20)
CO2: 20 mmol/L — ABNORMAL LOW (ref 22–32)
Calcium: 9.2 mg/dL (ref 8.9–10.3)
Chloride: 100 mmol/L (ref 98–111)
Creatinine, Ser: 11.21 mg/dL — ABNORMAL HIGH (ref 0.61–1.24)
GFR, Estimated: 6 mL/min — ABNORMAL LOW (ref >60)
Glucose, Bld: 278 mg/dL — ABNORMAL HIGH (ref 70–99)
Potassium: 5.3 mmol/L — ABNORMAL HIGH (ref 3.5–5.1)
Sodium: 136 mmol/L (ref 135–145)
Total Bilirubin: 0.5 mg/dL (ref 0.0–1.2)
Total Protein: 8.2 g/dL — ABNORMAL HIGH (ref 6.5–8.1)

## 2023-08-17 LAB — GLUCOSE, CAPILLARY
Glucose-Capillary: 180 mg/dL — ABNORMAL HIGH (ref 70–99)
Glucose-Capillary: 98 mg/dL (ref 70–99)
Glucose-Capillary: 122 mg/dL — ABNORMAL HIGH (ref 70–99)
Glucose-Capillary: 328 mg/dL — ABNORMAL HIGH (ref 70–99)

## 2023-08-17 LAB — HEMOGLOBIN A1C
Hgb A1c MFr Bld: 7.4 % — ABNORMAL HIGH (ref 4.8–5.6)
Mean Plasma Glucose: 165.68 mg/dL

## 2023-08-17 LAB — CBC
HCT: 33.2 % — ABNORMAL LOW (ref 39.0–52.0)
Hemoglobin: 10.6 g/dL — ABNORMAL LOW (ref 13.0–17.0)
MCH: 26.6 pg (ref 26.0–34.0)
MCHC: 31.9 g/dL (ref 30.0–36.0)
MCV: 83.4 fL (ref 80.0–100.0)
Platelets: 173 10*3/uL (ref 150–400)
RBC: 3.98 MIL/uL — ABNORMAL LOW (ref 4.22–5.81)
RDW: 19.8 % — ABNORMAL HIGH (ref 11.5–15.5)
WBC: 16.7 10*3/uL — ABNORMAL HIGH (ref 4.0–10.5)
nRBC: 0 % (ref 0.0–0.2)

## 2023-08-17 LAB — POCT I-STAT, CHEM 8
BUN: 44 mg/dL — ABNORMAL HIGH (ref 6–20)
Calcium, Ion: 0.96 mmol/L — ABNORMAL LOW (ref 1.15–1.40)
Chloride: 104 mmol/L (ref 98–111)
Creatinine, Ser: 11.7 mg/dL — ABNORMAL HIGH (ref 0.61–1.24)
Glucose, Bld: 149 mg/dL — ABNORMAL HIGH (ref 70–99)
HCT: 42 % (ref 39.0–52.0)
Hemoglobin: 14.3 g/dL (ref 13.0–17.0)
Potassium: 5 mmol/L (ref 3.5–5.1)
Sodium: 136 mmol/L (ref 135–145)
TCO2: 27 mmol/L (ref 22–32)

## 2023-08-17 LAB — PROTIME-INR
INR: 1.3 — ABNORMAL HIGH (ref 0.8–1.2)
Prothrombin Time: 16.5 s — ABNORMAL HIGH (ref 11.4–15.2)

## 2023-08-17 SURGERY — DEBRIDEMENT, WOUND
Anesthesia: General | Site: Arm Lower | Laterality: Right

## 2023-08-17 MED ORDER — HEPARIN 6000 UNIT IRRIGATION SOLUTION
Status: AC
  Filled 2023-08-17: qty 500

## 2023-08-17 MED ORDER — PROTAMINE SULFATE 10 MG/ML IV SOLN
INTRAVENOUS | Status: DC | PRN
  Administered 2023-08-17: 100 mg via INTRAVENOUS

## 2023-08-17 MED ORDER — INSULIN ASPART 100 UNIT/ML IJ SOLN
0.0000 [IU] | Freq: Every day | INTRAMUSCULAR | Status: DC
  Administered 2023-08-17: 4 [IU] via SUBCUTANEOUS

## 2023-08-17 MED ORDER — FENTANYL CITRATE (PF) 250 MCG/5ML IJ SOLN
INTRAMUSCULAR | Status: DC | PRN
  Administered 2023-08-17: 100 ug via INTRAVENOUS

## 2023-08-17 MED ORDER — PANTOPRAZOLE SODIUM 40 MG PO TBEC
40.0000 mg | DELAYED_RELEASE_TABLET | Freq: Every day | ORAL | Status: DC
  Administered 2023-08-18: 40 mg via ORAL
  Filled 2023-08-17: qty 1

## 2023-08-17 MED ORDER — PHENOL 1.4 % MT LIQD: 1.0000 | OROMUCOSAL | Status: DC | PRN

## 2023-08-17 MED ORDER — INSULIN ASPART 100 UNIT/ML IJ SOLN
2.0000 [IU] | Freq: Three times a day (TID) | INTRAMUSCULAR | Status: DC
  Administered 2023-08-18: 2 [IU] via SUBCUTANEOUS

## 2023-08-17 MED ORDER — ROSUVASTATIN CALCIUM 20 MG PO TABS
20.0000 mg | ORAL_TABLET | Freq: Every day | ORAL | Status: DC
  Administered 2023-08-18: 20 mg via ORAL
  Filled 2023-08-17: qty 1

## 2023-08-17 MED ORDER — ALUM & MAG HYDROXIDE-SIMETH 200-200-20 MG/5ML PO SUSP
15.0000 mL | ORAL | Status: DC | PRN
  Administered 2023-08-18 (×2): 30 mL via ORAL
  Filled 2023-08-17 (×2): qty 30

## 2023-08-17 MED ORDER — HEMOSTATIC AGENTS (NO CHARGE) OPTIME
TOPICAL | Status: DC | PRN
Start: 2023-08-17 — End: 2023-08-17
  Administered 2023-08-17: 1 via TOPICAL

## 2023-08-17 MED ORDER — FERRIC CITRATE 1 GM 210 MG(FE) PO TABS
840.0000 mg | ORAL_TABLET | Freq: Three times a day (TID) | ORAL | Status: DC
  Administered 2023-08-18: 840 mg via ORAL
  Filled 2023-08-17 (×4): qty 4

## 2023-08-17 MED ORDER — HEPARIN SODIUM (PORCINE) 1000 UNIT/ML IJ SOLN
INTRAMUSCULAR | Status: DC | PRN
  Administered 2023-08-17: 12000 [IU] via INTRAVENOUS

## 2023-08-17 MED ORDER — METOPROLOL TARTRATE 5 MG/5ML IV SOLN: 2.0000 mg | INTRAVENOUS | Status: DC | PRN

## 2023-08-17 MED ORDER — CHLORHEXIDINE GLUCONATE 4 % EX SOLN: 60.0000 mL | Freq: Once | CUTANEOUS | Status: DC

## 2023-08-17 MED ORDER — DEXMEDETOMIDINE HCL IN NACL 80 MCG/20ML IV SOLN
INTRAVENOUS | Status: DC | PRN
Start: 2023-08-17 — End: 2023-08-17
  Administered 2023-08-17 (×2): 4 ug via INTRAVENOUS

## 2023-08-17 MED ORDER — ACETAMINOPHEN 325 MG PO TABS: 325.0000 mg | ORAL_TABLET | ORAL | Status: DC | PRN

## 2023-08-17 MED ORDER — SUGAMMADEX SODIUM 200 MG/2ML IV SOLN
INTRAVENOUS | Status: DC | PRN
  Administered 2023-08-17: 300 mg via INTRAVENOUS

## 2023-08-17 MED ORDER — AMISULPRIDE (ANTIEMETIC) 5 MG/2ML IV SOLN
INTRAVENOUS | Status: AC
  Filled 2023-08-17: qty 4

## 2023-08-17 MED ORDER — POTASSIUM CHLORIDE CRYS ER 20 MEQ PO TBCR: 20.0000 meq | EXTENDED_RELEASE_TABLET | Freq: Once | ORAL | Status: DC

## 2023-08-17 MED ORDER — ALBUMIN HUMAN 5 % IV SOLN
INTRAVENOUS | Status: DC | PRN
Start: 2023-08-17 — End: 2023-08-17

## 2023-08-17 MED ORDER — SENNOSIDES-DOCUSATE SODIUM 8.6-50 MG PO TABS: 1.0000 | ORAL_TABLET | Freq: Every evening | ORAL | Status: DC | PRN

## 2023-08-17 MED ORDER — ACETAMINOPHEN 650 MG RE SUPP: 325.0000 mg | RECTAL | Status: DC | PRN

## 2023-08-17 MED ORDER — CALCIUM CHLORIDE 10 % IV SOLN
INTRAVENOUS | Status: DC | PRN
  Administered 2023-08-17: 250 mg via INTRAVENOUS

## 2023-08-17 MED ORDER — GABAPENTIN 100 MG PO CAPS
100.0000 mg | ORAL_CAPSULE | Freq: Three times a day (TID) | ORAL | Status: DC
  Administered 2023-08-17 – 2023-08-18 (×3): 100 mg via ORAL
  Filled 2023-08-17 (×3): qty 1

## 2023-08-17 MED ORDER — FERRIC CITRATE 1 GM 210 MG(FE) PO TABS: 420.0000 mg | ORAL_TABLET | Freq: Three times a day (TID) | ORAL | Status: DC | PRN

## 2023-08-17 MED ORDER — LEVOTHYROXINE SODIUM 75 MCG PO TABS
150.0000 ug | ORAL_TABLET | Freq: Every day | ORAL | Status: DC
  Administered 2023-08-18: 150 ug via ORAL
  Filled 2023-08-17: qty 2

## 2023-08-17 MED ORDER — SODIUM CHLORIDE 0.9% FLUSH: 3.0000 mL | Freq: Two times a day (BID) | INTRAVENOUS | Status: DC

## 2023-08-17 MED ORDER — CHLORHEXIDINE GLUCONATE 0.12 % MT SOLN
15.0000 mL | Freq: Once | OROMUCOSAL | Status: AC
  Administered 2023-08-17: 15 mL via OROMUCOSAL
  Filled 2023-08-17: qty 15

## 2023-08-17 MED ORDER — VASOPRESSIN 20 UNIT/ML IV SOLN
INTRAVENOUS | Status: DC | PRN
Start: 2023-08-17 — End: 2023-08-17
  Administered 2023-08-17: 1 [IU] via INTRAVENOUS
  Administered 2023-08-17: 2 [IU] via INTRAVENOUS
  Administered 2023-08-17 (×2): 1 [IU] via INTRAVENOUS

## 2023-08-17 MED ORDER — CEFAZOLIN SODIUM-DEXTROSE 3-4 GM/150ML-% IV SOLN
3.0000 g | INTRAVENOUS | Status: AC
  Administered 2023-08-17: 3 g via INTRAVENOUS
  Filled 2023-08-17: qty 150

## 2023-08-17 MED ORDER — HYDROMORPHONE HCL 1 MG/ML IJ SOLN
0.2500 mg | INTRAMUSCULAR | Status: DC | PRN
  Administered 2023-08-17 (×2): 0.25 mg via INTRAVENOUS
  Administered 2023-08-17: 0.5 mg via INTRAVENOUS

## 2023-08-17 MED ORDER — ASPIRIN 81 MG PO TBEC
81.0000 mg | DELAYED_RELEASE_TABLET | Freq: Every day | ORAL | Status: DC
  Administered 2023-08-18: 81 mg via ORAL
  Filled 2023-08-17: qty 1

## 2023-08-17 MED ORDER — AMISULPRIDE (ANTIEMETIC) 5 MG/2ML IV SOLN
10.0000 mg | Freq: Once | INTRAVENOUS | Status: AC | PRN
  Administered 2023-08-17: 10 mg via INTRAVENOUS

## 2023-08-17 MED ORDER — INSULIN ASPART 100 UNIT/ML IJ SOLN
0.0000 [IU] | INTRAMUSCULAR | Status: DC | PRN
  Administered 2023-08-17: 2 [IU] via SUBCUTANEOUS

## 2023-08-17 MED ORDER — ROCURONIUM BROMIDE 10 MG/ML (PF) SYRINGE
PREFILLED_SYRINGE | INTRAVENOUS | Status: DC | PRN
  Administered 2023-08-17: 30 mg via INTRAVENOUS
  Administered 2023-08-17: 50 mg via INTRAVENOUS

## 2023-08-17 MED ORDER — LABETALOL HCL 5 MG/ML IV SOLN: 10.0000 mg | INTRAVENOUS | Status: DC | PRN

## 2023-08-17 MED ORDER — SODIUM CHLORIDE 0.9% FLUSH: 3.0000 mL | INTRAVENOUS | Status: DC | PRN

## 2023-08-17 MED ORDER — CHLORHEXIDINE GLUCONATE CLOTH 2 % EX PADS
6.0000 | MEDICATED_PAD | Freq: Every day | CUTANEOUS | Status: DC
Start: 2023-08-18 — End: 2023-08-18
  Administered 2023-08-18: 6 via TOPICAL

## 2023-08-17 MED ORDER — HEPARIN SODIUM (PORCINE) 5000 UNIT/ML IJ SOLN
5000.0000 [IU] | Freq: Three times a day (TID) | INTRAMUSCULAR | Status: DC
  Administered 2023-08-18: 5000 [IU] via SUBCUTANEOUS
  Filled 2023-08-17 (×2): qty 1

## 2023-08-17 MED ORDER — ACETAMINOPHEN 500 MG PO TABS
1000.0000 mg | ORAL_TABLET | Freq: Once | ORAL | Status: AC
  Administered 2023-08-17: 1000 mg via ORAL
  Filled 2023-08-17: qty 2

## 2023-08-17 MED ORDER — PHENYLEPHRINE 80 MCG/ML (10ML) SYRINGE FOR IV PUSH (FOR BLOOD PRESSURE SUPPORT)
PREFILLED_SYRINGE | INTRAVENOUS | Status: DC | PRN
  Administered 2023-08-17 (×2): 160 ug via INTRAVENOUS

## 2023-08-17 MED ORDER — CEFAZOLIN SODIUM-DEXTROSE 2-4 GM/100ML-% IV SOLN
2.0000 g | Freq: Once | INTRAVENOUS | Status: AC
  Administered 2023-08-18: 2 g via INTRAVENOUS
  Filled 2023-08-17: qty 100

## 2023-08-17 MED ORDER — ONDANSETRON HCL 4 MG/2ML IJ SOLN
INTRAMUSCULAR | Status: DC | PRN
  Administered 2023-08-17: 4 mg via INTRAVENOUS

## 2023-08-17 MED ORDER — 0.9 % SODIUM CHLORIDE (POUR BTL) OPTIME
TOPICAL | Status: DC | PRN
  Administered 2023-08-17: 1000 mL

## 2023-08-17 MED ORDER — ONDANSETRON HCL 4 MG/2ML IJ SOLN: 4.0000 mg | Freq: Four times a day (QID) | INTRAMUSCULAR | Status: DC | PRN

## 2023-08-17 MED ORDER — INSULIN ASPART 100 UNIT/ML IJ SOLN
0.0000 [IU] | Freq: Three times a day (TID) | INTRAMUSCULAR | Status: DC
Start: 2023-08-18 — End: 2023-08-18
  Administered 2023-08-18: 4 [IU] via SUBCUTANEOUS
  Administered 2023-08-18: 7 [IU] via SUBCUTANEOUS

## 2023-08-17 MED ORDER — DEXAMETHASONE SODIUM PHOSPHATE 10 MG/ML IJ SOLN
INTRAMUSCULAR | Status: DC | PRN
  Administered 2023-08-17: 10 mg via INTRAVENOUS

## 2023-08-17 MED ORDER — PHENYLEPHRINE HCL-NACL 20-0.9 MG/250ML-% IV SOLN
INTRAVENOUS | Status: DC | PRN
  Administered 2023-08-17: 25 ug/min via INTRAVENOUS

## 2023-08-17 MED ORDER — HYDROMORPHONE HCL 1 MG/ML IJ SOLN
INTRAMUSCULAR | Status: AC
  Filled 2023-08-17: qty 1

## 2023-08-17 MED ORDER — ORAL CARE MOUTH RINSE: 15.0000 mL | Freq: Once | OROMUCOSAL | Status: AC

## 2023-08-17 MED ORDER — OXYCODONE-ACETAMINOPHEN 5-325 MG PO TABS
1.0000 | ORAL_TABLET | ORAL | Status: DC | PRN
  Administered 2023-08-17: 1 via ORAL
  Administered 2023-08-18 (×3): 2 via ORAL
  Filled 2023-08-17 (×4): qty 2

## 2023-08-17 MED ORDER — HYDROMORPHONE HCL 1 MG/ML IJ SOLN: 0.5000 mg | INTRAMUSCULAR | Status: DC | PRN

## 2023-08-17 MED ORDER — SODIUM CHLORIDE 0.9 % IV SOLN: INTRAVENOUS | Status: DC

## 2023-08-17 MED ORDER — PROPOFOL 10 MG/ML IV BOLUS
INTRAVENOUS | Status: DC | PRN
  Administered 2023-08-17: 200 mg via INTRAVENOUS

## 2023-08-17 MED ORDER — GUAIFENESIN-DM 100-10 MG/5ML PO SYRP: 15.0000 mL | ORAL_SOLUTION | ORAL | Status: DC | PRN

## 2023-08-17 MED ORDER — DOCUSATE SODIUM 100 MG PO CAPS
100.0000 mg | ORAL_CAPSULE | Freq: Two times a day (BID) | ORAL | Status: DC
  Administered 2023-08-17 – 2023-08-18 (×2): 100 mg via ORAL
  Filled 2023-08-17 (×2): qty 1

## 2023-08-17 MED ORDER — SODIUM CHLORIDE 0.9 % IV SOLN: 250.0000 mL | INTRAVENOUS | Status: DC | PRN

## 2023-08-17 MED ORDER — FENTANYL CITRATE (PF) 250 MCG/5ML IJ SOLN
INTRAMUSCULAR | Status: AC
Start: 2023-08-17 — End: ?
  Filled 2023-08-17: qty 5

## 2023-08-17 SURGICAL SUPPLY — 48 items
BAG COUNTER SPONGE SURGICOUNT (BAG) ×2 IMPLANT
BNDG ELASTIC 4INX 5YD STR LF (GAUZE/BANDAGES/DRESSINGS) IMPLANT
BNDG ELASTIC 4X5.8 VLCR STR LF (GAUZE/BANDAGES/DRESSINGS) IMPLANT
BNDG ELASTIC 6INX 5YD STR LF (GAUZE/BANDAGES/DRESSINGS) IMPLANT
BNDG ELASTIC 6X10 VLCR STRL LF (GAUZE/BANDAGES/DRESSINGS) IMPLANT
BNDG GAUZE DERMACEA FLUFF 4 (GAUZE/BANDAGES/DRESSINGS) IMPLANT
CANISTER SUCT 3000ML PPV (MISCELLANEOUS) ×2 IMPLANT
CANISTER WOUNDNEG PRESSURE 500 (CANNISTER) IMPLANT
CLAMP SUTURE YELLOW 5 PAIRS (MISCELLANEOUS) IMPLANT
CLIP TI MEDIUM 6 (CLIP) ×2 IMPLANT
CLIP TI WIDE RED SMALL 6 (CLIP) ×2 IMPLANT
COVER SURGICAL LIGHT HANDLE (MISCELLANEOUS) ×2 IMPLANT
CUFF TRNQT CYL 34X4.125X (TOURNIQUET CUFF) IMPLANT
DRAPE HALF SHEET 40X57 (DRAPES) IMPLANT
DRAPE U-SHAPE 76X120 STRL (DRAPES) IMPLANT
DRSG VAC GRANUFOAM SM (GAUZE/BANDAGES/DRESSINGS) IMPLANT
ELECT REM PT RETURN 9FT ADLT (ELECTROSURGICAL) ×2 IMPLANT
ELECTRODE REM PT RTRN 9FT ADLT (ELECTROSURGICAL) ×2 IMPLANT
GAUZE SPONGE 4X4 12PLY STRL (GAUZE/BANDAGES/DRESSINGS) ×2 IMPLANT
GAUZE XEROFORM 5X9 LF (GAUZE/BANDAGES/DRESSINGS) IMPLANT
GLOVE BIOGEL PI IND STRL 7.0 (GLOVE) ×2 IMPLANT
GOWN STRL REUS W/ TWL LRG LVL3 (GOWN DISPOSABLE) ×4 IMPLANT
GOWN STRL REUS W/ TWL XL LVL3 (GOWN DISPOSABLE) ×2 IMPLANT
HEMOSTAT SNOW SURGICEL 2X4 (HEMOSTASIS) IMPLANT
IV NS IRRIG 3000ML ARTHROMATIC (IV SOLUTION) ×2 IMPLANT
KIT BASIN OR (CUSTOM PROCEDURE TRAY) ×2 IMPLANT
KIT TURNOVER KIT B (KITS) ×2 IMPLANT
NS IRRIG 1000ML POUR BTL (IV SOLUTION) ×2 IMPLANT
PACK CV ACCESS (CUSTOM PROCEDURE TRAY) IMPLANT
PACK GENERAL/GYN (CUSTOM PROCEDURE TRAY) ×2 IMPLANT
PACK UNIVERSAL I (CUSTOM PROCEDURE TRAY) ×2 IMPLANT
PAD ARMBOARD POSITIONER FOAM (MISCELLANEOUS) ×4 IMPLANT
POWDER SURGICEL 3.0 GRAM (HEMOSTASIS) IMPLANT
PULSAVAC PLUS IRRIG FAN TIP (DISPOSABLE) IMPLANT
SET HNDPC FAN SPRY TIP SCT (DISPOSABLE) IMPLANT
STAPLER VISISTAT (STAPLE) IMPLANT
SUT ETHILON 3 0 PS 1 (SUTURE) IMPLANT
SUT MNCRL AB 4-0 PS2 18 (SUTURE) IMPLANT
SUT PROLENE 5 0 C 1 24 (SUTURE) IMPLANT
SUT SILK 2-0 18XBRD TIE 12 (SUTURE) IMPLANT
SUT SILK 3-0 18XBRD TIE 12 (SUTURE) IMPLANT
SUT VIC AB 2-0 CTX 36 (SUTURE) IMPLANT
SUT VIC AB 3-0 SH 27X BRD (SUTURE) IMPLANT
SUT VICRYL 4-0 PS2 18IN ABS (SUTURE) IMPLANT
TAG SUTURE CLAMP YLW 5PR (MISCELLANEOUS) ×2 IMPLANT
TIP FAN IRRIG PULSAVAC PLUS (DISPOSABLE) IMPLANT
TOWEL GREEN STERILE (TOWEL DISPOSABLE) ×2 IMPLANT
WATER STERILE IRR 1000ML POUR (IV SOLUTION) ×2 IMPLANT

## 2023-08-17 NOTE — Transfer of Care (Signed)
 Immediate Anesthesia Transfer of Care Note  Patient: Paul [name]  Procedure(s) Performed: DEBRIDEMENT, WOUND (Right) APPLICATION, WOUND VAC (Right: Arm Lower)  Patient Location: PACU  Anesthesia Type:General  Level of Consciousness: awake, alert , and oriented  Airway & Oxygen Therapy: Patient Spontanous Breathing  Post-op Assessment: Report given to RN  Post vital signs: Reviewed and stable  Last Vitals:  Vitals Value Taken Time  BP 124/72 08/17/23 1651  Temp    Pulse 97 08/17/23 1655  Resp 30 08/17/23 1655  SpO2 98 % 08/17/23 1655  Vitals shown include unfiled device data.  Last Pain:  Vitals:   08/17/23 1159  TempSrc:   PainSc: 0-No pain         Complications: No notable events documented.

## 2023-08-17 NOTE — Anesthesia Postprocedure Evaluation (Signed)
 Anesthesia Post Note  Patient: Paiton Fosco  Procedure(s) Performed: DEBRIDEMENT, WOUND (Right) APPLICATION, WOUND VAC (Right: Arm Lower) REMOVAL OF ARTERIOVENOUS GORETEX GRAFT (AVGG) (Right: Arm Lower)     Patient location during evaluation: PACU Anesthesia Type: General Level of consciousness: awake and alert Pain management: pain level controlled Vital Signs Assessment: post-procedure vital signs reviewed and stable Respiratory status: spontaneous breathing, nonlabored ventilation and respiratory function stable Cardiovascular status: blood pressure returned to baseline and stable Postop Assessment: no apparent nausea or vomiting Anesthetic complications: no  No notable events documented.  Last Vitals:  Vitals:   08/17/23 1715 08/17/23 1730  BP: 111/77 108/84  Pulse: 90 86  Resp: 14 15  Temp:    SpO2: 95% 97%    Last Pain:  Vitals:   08/17/23 1713  TempSrc:   PainSc: 7                  Jenner Rosier,W. EDMOND

## 2023-08-17 NOTE — Anesthesia Preprocedure Evaluation (Addendum)
 Anesthesia Evaluation  Patient identified by MRN, date of birth, ID band Patient awake    Reviewed: Allergy & Precautions, H&P , NPO status , Patient's Chart, lab work & pertinent test results  Airway Mallampati: III  TM Distance: >3 FB Neck ROM: Full    Dental no notable dental hx. (+) Teeth Intact, Dental Advisory Given   Pulmonary neg pulmonary ROS   Pulmonary exam normal breath sounds clear to auscultation       Cardiovascular hypertension, + Peripheral Vascular Disease   Rhythm:Regular Rate:Normal     Neuro/Psych  Headaches  negative psych ROS   GI/Hepatic Neg liver ROS,GERD  ,,  Endo/Other  diabetes, Insulin DependentHypothyroidism  Class 3 obesity  Renal/GU ESRF and DialysisRenal disease  negative genitourinary   Musculoskeletal   Abdominal   Peds  Hematology  (+) Blood dyscrasia, anemia   Anesthesia Other Findings   Reproductive/Obstetrics negative OB ROS                             Anesthesia Physical Anesthesia Plan  ASA: 3  Anesthesia Plan: General   Post-op Pain Management: Tylenol PO (pre-op)*   Induction: Intravenous  PONV Risk Score and Plan: 3 and Ondansetron, Dexamethasone and Midazolam  Airway Management Planned: Oral ETT and Video Laryngoscope Planned  Additional Equipment:   Intra-op Plan:   Post-operative Plan: Extubation in OR  Informed Consent: I have reviewed the patients History and Physical, chart, labs and discussed the procedure including the risks, benefits and alternatives for the proposed anesthesia with the patient or authorized representative who has indicated his/her understanding and acceptance.     Dental advisory given  Plan Discussed with: CRNA  Anesthesia Plan Comments:        Anesthesia Quick Evaluation

## 2023-08-17 NOTE — Op Note (Signed)
 OPERATIVE NOTE  PROCEDURE:   Incision and drainage of right arm abscess Excision of Right arm AV graft Wound vac application  PRE-OPERATIVE DIAGNOSIS: infected right arm AVG  POST-OPERATIVE DIAGNOSIS: same as above   SURGEON: Daria Pastures MD  ASSISTANT(S): Nathanial Rancher, PA  Given the complexity of the case,  the assistant was necessary in order to expedient the procedure and safely perform the technical aspects of the operation.  The assistant provided traction and countertraction to assist with exposure of the artery and vein. These skills, especially following the Prolene suture for the anastomosis, could not have been adequately performed by a scrub tech assistant.   ANESTHESIA: general  ESTIMATED BLOOD LOSS: 25 cc  FINDING(S): Thin purulent fluid surrounding arterial anastomosis Non-incorporated PTFE graft throughout it's course Multiphasic radial signal on completion  SPECIMEN(S):   Swab - Right upper arm abscess  INDICATIONS:   Paul [name] is a 33 y.o. male with ESRD who recently underwent a right upper extremity interposition PTFE AV graft.  There was noted to be significant bleeding from venous hypertension and there was significant blood loss during surgery.  He presented back to clinic for wound breakdown of the antecubital incision and his grafts noted to be thrombosed on duplex.  Risks and benefits of wound exploration with possible excision and possible VAC placement were reviewed, he expressed understanding and elects to proceed.  DESCRIPTION: The patient was brought to the operating room and positioned supine on the operating table.  Anesthesia was induced and the right arm was prepped and draped in the usual sterile fashion.  Preoperative antibiotics were administered and a timeout was performed. A tourniquet was applied to the upper arm and inflated to 250 mmHg and the patient was systemically heparinized.  The staples were then removed from the incision  and the antecubital fossa incision was opened.  We immediately encountered thin purulent fluid and a culture was sent.  The graft was then identified and noted to be floating and not incorporated down to the anastomosis.  Dissection of the brachial artery was performed in the anterior surface of brachial artery was identified.  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  The vein cuff was clamped and the AV graft was removed to the anastomosis.  I then confirmed that there was flow through the brachial artery with a Doppler machine and also noted a multiphasic radial artery signal at the wrist.  This vein cuff was then oversewn with a 5-0 Prolene.  It appeared that the midportion of the graft may be incorporated for that reason a counterincision was made on the lateral portion of the arm overlying the AV graft.  Dissection was carried deeper into the graft encountered and was then realized that the entire graft was not incorporated.  The axillary incision was then reopened and although there was no purulent, no element of the graft was scarred in.  The end to end anastomosis to the small axillary vein was identified and the vein was clamped with a right angle. The graft was excised at the anastomosis.  The axillary vein was then ligated with a 2-0 silk tie.  Heparin was reversed and all incisions were copiously irrigated.  Hemostasis was achieved with electrocautery.  The axillary incision was closed in layers with 3-0 Vicryl for the deeper tissue and skin staples mixed with 3-0  vertical mattress nylon sutures to support some of the friable tissue.  The counterincision was closed loosely with skin staples and a wound VAC was applied to the antecubital incision.  Dry 4 x 4's were then applied to the axillary incision and the entire arm was wrapped in an Ace bandage.  WERE correct at the end of the  procedure.  The patient tolerated the procedure well and was transferred to recovery in stable condition.   COMPLICATIONS: none apparent  CONDITION: stable  Daria Pastures MD [facility] Phone Number: [phone] 08/17/2023 4:39 PM

## 2023-08-17 NOTE — Interval H&P Note (Signed)
 History and Physical Interval Note:  08/17/2023 2:03 PM  Paul [name]  has presented today for surgery, with the diagnosis of ESRD.  The various methods of treatment have been discussed with the patient and family. After consideration of risks, benefits and other options for treatment, the patient has consented to  Procedure(s): DEBRIDEMENT, WOUND (Right) as a surgical intervention.  The patient's history has been reviewed, patient examined, no change in status, stable for surgery.  I have reviewed the patient's chart and labs.  Questions were answered to the patient's satisfaction.     Daria Pastures

## 2023-08-18 ENCOUNTER — Encounter (HOSPITAL_COMMUNITY): Payer: Self-pay | Admitting: Vascular Surgery

## 2023-08-18 ENCOUNTER — Other Ambulatory Visit (HOSPITAL_COMMUNITY): Payer: Self-pay

## 2023-08-18 DIAGNOSIS — T827XXA Infection and inflammatory reaction due to other cardiac and vascular devices, implants and grafts, initial encounter: Secondary | ICD-10-CM | POA: Diagnosis not present

## 2023-08-18 LAB — GLUCOSE, CAPILLARY
Glucose-Capillary: 171 mg/dL — ABNORMAL HIGH (ref 70–99)
Glucose-Capillary: 208 mg/dL — ABNORMAL HIGH (ref 70–99)
Glucose-Capillary: 242 mg/dL — ABNORMAL HIGH (ref 70–99)

## 2023-08-18 MED ORDER — HEPARIN SODIUM (PORCINE) 1000 UNIT/ML IJ SOLN
1.9000 mL | Freq: Once | INTRAMUSCULAR | Status: AC
  Administered 2023-08-18: 1900 [IU] via INTRAVENOUS

## 2023-08-18 MED ORDER — MINOCYCLINE HCL 50 MG PO CAPS
200.0000 mg | ORAL_CAPSULE | Freq: Two times a day (BID) | ORAL | Status: DC
  Administered 2023-08-18: 200 mg via ORAL
  Filled 2023-08-18 (×2): qty 4

## 2023-08-18 MED ORDER — LEVOFLOXACIN 750 MG PO TABS
750.0000 mg | ORAL_TABLET | Freq: Once | ORAL | Status: AC
  Administered 2023-08-18: 750 mg via ORAL
  Filled 2023-08-18: qty 1

## 2023-08-18 MED ORDER — LEVOFLOXACIN 500 MG PO TABS
ORAL_TABLET | ORAL | 0 refills | Status: DC
  Filled 2023-08-18: qty 10, 8d supply, fill #0

## 2023-08-18 MED ORDER — LEVOFLOXACIN 500 MG PO TABS: 500.0000 mg | ORAL_TABLET | ORAL | Status: DC

## 2023-08-18 MED ORDER — MINOCYCLINE HCL 100 MG PO CAPS
200.0000 mg | ORAL_CAPSULE | Freq: Two times a day (BID) | ORAL | 0 refills | Status: DC
Start: 2023-08-18 — End: 2024-01-10
  Filled 2023-08-18: qty 56, 14d supply, fill #0

## 2023-08-18 MED ORDER — OXYCODONE-ACETAMINOPHEN 5-325 MG PO TABS
1.0000 | ORAL_TABLET | Freq: Four times a day (QID) | ORAL | 0 refills | Status: DC | PRN
Start: 2023-08-18 — End: 2023-08-23
  Filled 2023-08-18: qty 20, 5d supply, fill #0

## 2023-08-18 NOTE — Addendum Note (Signed)
 Addendum  created 08/18/23 0815 by Dorie Rank, CRNA   Intraprocedure Meds edited

## 2023-08-18 NOTE — Progress Notes (Signed)
 Mobility Specialist Progress Note:    08/18/23 1035  Mobility  Activity Ambulated independently in hallway  Level of Assistance Independent  Assistive Device None  Distance Ambulated (ft) 470 ft  Activity Response Tolerated well  Mobility Referral Yes  Mobility visit 1 Mobility  Mobility Specialist Start Time (ACUTE ONLY) 1030  Mobility Specialist Stop Time (ACUTE ONLY) 1035  Mobility Specialist Time Calculation (min) (ACUTE ONLY) 5 min   Pt received in bed, agreeable to mobility session. Ambulated in hallway independently. Tolerated well, asx throughout. Returned pt to room, all needs met.  Pre Mobility: HR 77 bpm During Mobility: HR 122 bpm Post Mobility: HR 83 bpm  Feliciana Rossetti Mobility Specialist Please contact via Special educational needs teacher or  Rehab office at [phone]

## 2023-08-18 NOTE — Progress Notes (Signed)
 AVS completed and placed with chart. Patient waiting on Ins. Auth. For wound vac.

## 2023-08-18 NOTE — TOC Transition Note (Signed)
 Transition of Care [facility]) - Discharge Note Donn Pierini RN, BSN Transitions of Care Unit 4E- RN Case Manager See Treatment Team for direct phone #   Patient Details  Name: Paul [name] MRN: [id number] Date of Birth: [birth year]  Transition of Care [facility]) CM/SW Contact:  Darrold Span, RN Phone Number: 08/18/2023, 4:49 PM   Clinical Narrative:    Pt stable for transition home today pending HHRN and home VAC arrangements.   [employer] order form has been signed and faxed to [employer] to start insurance approval process. CM spoke with liaison who will update this writer once approved and POD pending.   CM spoke with pt at bedside- discussed HH and VAC needs, informed pt of home VAC pending approval and hopeful of approval for later today. Choice for [facility] services offered- pt voiced he did not have a preference for [facility] provider- explained to pt that [facility] may be a barrier, asked if pt had transportation to get to [facility] office should that be needed for drsg changes as back up- pt voiced he felt he could work it out if needed.   Address, phone # and PCP all confirmed.   CM made call to [facility] liaison for [facility] need.  1150- per liaison- [facility] can accept however SOC delay until 4/2 for first wound VAC change- spoke with [facility] PA- M. Collins- who stated that [facility] for 4/2 is fine and 2x drsg changes is ok as well. [facility] liaison updated- and pt will have first wound VAC drsg in the home on 4/2.   1515- [facility] liaison notified CM that home wound VAC has been approved- she will send POD for delivery.   1645- POD received and readyVAC has been delivered to the bedside for home VAC needs. Signed POD has been faxed back to [facility] liaison, copy of POD placed on chart- pt given original. Bedside RN aware and to change over to home VAC. Pt has contacted family to transport home.   No further CM needs noted.    Final next level of care: Home w Home Health Services Barriers to Discharge: Barriers Resolved   Patient  Goals and CMS Choice Patient states their goals for this hospitalization and ongoing recovery are:: return home CMS Medicare.gov Compare Post Acute Care list provided to:: Patient Choice offered to / list presented to : Patient      Discharge Placement               Home w/ [facility]        Discharge Plan and Services Additional resources added to the After Visit Summary for     Discharge Planning Services: CM Consult Post Acute Care Choice: Durable Medical Equipment, Home Health          DME Arranged: Vac DME Agency: [facility] Date DME Agency Contacted: 08/18/23 Time DME Agency Contacted: 1130 Representative spoke with at DME Agency: French Ana HH Arranged: RN HH Agency: [facility] Date HH Agency Contacted: 08/18/23 Time HH Agency Contacted: 1150 Representative spoke with at [facility] Agency: Adele Dan  Social Drivers of Health (SDOH) Interventions SDOH Screenings   Food Insecurity: No Food Insecurity (09/29/2022)  Housing: Low Risk  (09/29/2022)  Transportation Needs: No Transportation Needs (09/29/2022)  Utilities: Not At Risk (09/29/2022)  Alcohol Screen: Low Risk  (06/07/2021)  Depression (PHQ2-9): Medium Risk (05/11/2022)  Physical Activity: Insufficiently Active (07/07/2021)  Social Connections: Socially Isolated (06/07/2021)  Stress: Stress Concern Present (08/06/2021)  Tobacco Use: Low Risk  (08/17/2023)  Readmission Risk Interventions     No data to display

## 2023-08-18 NOTE — Progress Notes (Addendum)
 [facility] [place]  Subjective  - Doing well over all   Objective 129/81 73 97.6 F (36.4 C) (Oral) 14 100%  Intake/Output Summary (Last 24 hours) at 08/18/2023 1610 Last data filed at 08/17/2023 2300 Gross per 24 hour  Intake 2360 ml  Output --  Net 2360 ml    Right UE incision healing well without hematoma, no erythema or abnormal edema Vac with good seal compartments soft Grip 5/5 equal B UE Lungs non labored breathing  Assessment/Planning: POD #1 PROCEDURE:   Incision and drainage of right arm abscess Excision of Right arm AV graft Wound vac application  Ace wrap removed very minimal edema, motor and sensation intact M/N/V intact right UE Incisions healing well  Cultures  FEW GRAM POSITIVE COCCI IN PAIRS   Culture MODERATE STAPHYLOCOCCUS AUREUS   FEW WBC PRESENT, PREDOMINANTLY PMN RARE GRAM POSITIVE COCCI RARE SQUAMOUS EPITHELIAL CELLS PRESENT   Culture RARE ESCHERICHIA COLI RARE STENOTROPHOMONAS MALTOPHILIA NO ANAEROBES ISOLATED   Leukocytosis WBC 16.7 Not currently on antibiotics pending f/u cultures Pain well controlled Will start home vac process pending discharge plans HH vac ordered Spoke with pharmacy about culture results and they will help with antibiotic plan.   Mosetta Pigeon 08/18/2023 7:18 AM --  Laboratory Lab Results: Recent Labs    08/17/23 1225 08/17/23 2027  WBC  --  16.7*  HGB 14.3 10.6*  HCT 42.0 33.2*  PLT  --  173   BMET Recent Labs    08/17/23 1225 08/17/23 2027  NA 136 136  K 5.0 5.3*  CL 104 100  CO2  --  20*  GLUCOSE 149* 278*  BUN 44* 32*  CREATININE 11.70* 11.21*  CALCIUM  --  9.2    COAG Lab Results  Component Value Date   INR 1.3 (H) 08/17/2023   INR 1.8 (H) 09/29/2022   INR 1.3 (H) 05/06/2021   No results found for: "PTT"  VASCULAR STAFF ADDENDUM: I have independently interviewed and examined the patient. I agree with the above.  Will plan for discharge  today with home vac and PO abx  Daria Pastures MD [facility] Phone Number: [phone] 08/18/2023 8:34 AM

## 2023-08-18 NOTE — Progress Notes (Signed)
         Infectious disease pharmacy recommended 3 weeks fo Levaquin 500 mg q 48 hours and Minocycline 200 mg BID.  MODERATE STAPHYLOCOCCUS AUREUS  RARE ESCHERICHIA COLI RARE STENOTROPHOMONAS MALTOPHILIA NO ANAEROBES ISOLATED   Pending final cultures  Mosetta Pigeon PA-C

## 2023-08-19 DIAGNOSIS — E1169 Type 2 diabetes mellitus with other specified complication: Secondary | ICD-10-CM | POA: Diagnosis not present

## 2023-08-19 DIAGNOSIS — T8189XA Other complications of procedures, not elsewhere classified, initial encounter: Secondary | ICD-10-CM | POA: Diagnosis not present

## 2023-08-21 ENCOUNTER — Other Ambulatory Visit: Payer: Self-pay | Admitting: Student

## 2023-08-21 DIAGNOSIS — E1129 Type 2 diabetes mellitus with other diabetic kidney complication: Secondary | ICD-10-CM | POA: Diagnosis not present

## 2023-08-21 DIAGNOSIS — E1169 Type 2 diabetes mellitus with other specified complication: Secondary | ICD-10-CM | POA: Diagnosis not present

## 2023-08-21 DIAGNOSIS — T8189XA Other complications of procedures, not elsewhere classified, initial encounter: Secondary | ICD-10-CM | POA: Diagnosis not present

## 2023-08-21 DIAGNOSIS — Z992 Dependence on renal dialysis: Secondary | ICD-10-CM | POA: Diagnosis not present

## 2023-08-21 DIAGNOSIS — N186 End stage renal disease: Secondary | ICD-10-CM | POA: Diagnosis not present

## 2023-08-21 NOTE — Discharge Summary (Signed)
 [facility] Discharge Summary   Patient ID:  Paul [name] MRN: [id number] DOB/AGE: 31-Jun-1992 31 y.o.  Admit date: 08/17/2023 Discharge date: 08/18/23 Date of Surgery: 08/17/2023 Surgeon: Surgeon(s): Daria Pastures, MD  Admission Diagnosis: End stage renal [facility]) [N18.6] Infection of vascular bypass graft, initial encounter (HCC) [T82.7XXA]  Discharge Diagnoses:  End stage renal disease (HCC) [N18.6] Infection of vascular bypass graft, initial encounter (HCC) [W09.7XXA]  Secondary Diagnoses: Past Medical History:  Diagnosis Date   Acute hypoxemic respiratory failure (HCC) 09/11/2016   Anemia    Chronic kidney disease    ARF on CRF Dialysis T/TH/Sa   Complication of anesthesia    per patient blood pressure drops   Diabetes mellitus    Type II   End stage renal disease on dialysis (HCC)    [place]    GERD (gastroesophageal reflux disease)    diet controlled   HLD (hyperlipidemia)    Hypertension    Hypothyroidism    Morbid obesity (HCC)    Sacral wound    resolved   Thyroid disease     Procedure(s): DEBRIDEMENT, WOUND APPLICATION, WOUND VAC REMOVAL OF ARTERIOVENOUS GORETEX GRAFT (AVGG)  Discharged Condition: stable  HPI:  33 y/o male presented with incisional breakdown over an AV graft placed by Dr. Randie Heinz on 08/01/23.  He presented to the office for exam.  He has a working [facility].  He is on anticoagulation.     Hospital Course:  Paul [name] is a 33 y.o. male is S/P  Procedure(s): DEBRIDEMENT, WOUND APPLICATION, WOUND VAC REMOVAL OF ARTERIOVENOUS GORETEX GRAFT (AVGG) Post op course was uneventful.  He will be discharged with a home vac and HH RN for 2 times a week vac changes.  He has a palpable radial pulse and motor intact.  Plans will be for f/u in 2-3 weeks to check the wound.  He has positive infection with cultures reporting:   Infectious disease pharmacy recommended 3 weeks fo Levaquin 500 mg q 48 hours and Minocycline 200  mg BID.   MODERATE STAPHYLOCOCCUS AUREUS  RARE ESCHERICHIA COLI RARE STENOTROPHOMONAS MALTOPHILIA NO ANAEROBES ISOLATED   Consults:  Treatment Team:  Maeola Harman, MD  Significant Diagnostic Studies: CBC Lab Results  Component Value Date   WBC 16.7 (H) 08/17/2023   HGB 10.6 (L) 08/17/2023   HCT 33.2 (L) 08/17/2023   MCV 83.4 08/17/2023   PLT 173 08/17/2023    BMET    Component Value Date/Time   NA 136 08/17/2023 2027   NA 139 06/14/2019 0914   K 5.3 (H) 08/17/2023 2027   CL 100 08/17/2023 2027   CO2 20 (L) 08/17/2023 2027   GLUCOSE 278 (H) 08/17/2023 2027   BUN 32 (H) 08/17/2023 2027   BUN 31 (H) 06/14/2019 0914   CREATININE 11.21 (H) 08/17/2023 2027   CREATININE 1.85 (H) 11/28/2014 1153   CALCIUM 9.2 08/17/2023 2027   GFRNONAA 6 (L) 08/17/2023 2027   GFRNONAA 51 (L) 11/28/2014 1153   GFRAA 6 (L) 06/14/2019 0914   GFRAA 58 (L) 11/28/2014 1153   COAG Lab Results  Component Value Date   INR 1.3 (H) 08/17/2023   INR 1.8 (H) 09/29/2022   INR 1.3 (H) 05/06/2021     Disposition:  Discharge to :Home Discharge Instructions     Call MD for:  redness, tenderness, or signs of infection (pain, swelling, bleeding, redness, odor or green/yellow discharge around incision site)   Complete by: As directed    Call MD  for:  severe or increased pain, loss or decreased feeling  in affected limb(s)   Complete by: As directed    Call MD for:  temperature >100.5   Complete by: As directed    Resume previous diet   Complete by: As directed       Allergies as of 08/18/2023       Reactions   Hydralazine Hcl Other (See Comments)   DRUG-INDUCED LUPUS        Medication List     TAKE these medications    aspirin EC 81 MG tablet Take 1 tablet (81 mg total) by mouth daily. Swallow whole.   Eliquis 5 MG Tabs tablet Generic drug: apixaban Take 5 mg by mouth 2 (two) times daily.   ferric citrate 1 GM 210 MG(Fe) tablet Commonly known as: AURYXIA Take 2-4  tablets (420-840 mg total) by mouth See admin instructions. Take 4 tablets (840 mg) by mouth with each meal & take 2 tablets (420 mg) by mouth with each snack.   gabapentin 100 MG capsule Commonly known as: NEURONTIN TAKE 1 CAPSULE BY MOUTH THREE TIMES DAILY   Lantus 100 UNIT/ML injection Generic drug: insulin glargine INJECT 5 UNITS SUBCUTANEOUSLY TWICE DAILY What changed: See the new instructions.   levofloxacin 500 MG tablet Commonly known as: LEVAQUIN Take 1.5 tablets (750 mg total) by mouth every other day AND 1 tablet (500 mg total) every other day.   levothyroxine 150 MCG tablet Commonly known as: SYNTHROID TAKE 1 TABLET BY MOUTH ONCE DAILY BEFORE BREAKFAST . APPOINTMENT REQUIRED FOR FUTURE REFILLS What changed: See the new instructions.   midodrine 5 MG tablet Commonly known as: PROAMATINE Take 1 tablet (5 mg total) by mouth Every Tuesday,Thursday,and Saturday with dialysis.   minocycline 100 MG capsule Commonly known as: MINOCIN Take 2 capsules (200 mg total) by mouth 2 (two) times daily.   multivitamin Tabs tablet Take 1 tablet by mouth at bedtime.   oxyCODONE-acetaminophen 5-325 MG tablet Commonly known as: PERCOCET/ROXICET Take 1 tablet by mouth every 6 (six) hours as needed for moderate pain (pain score 4-6). What changed: reasons to take this   rosuvastatin 20 MG tablet Commonly known as: CRESTOR Take 1 tablet by mouth once daily   zinc sulfate (50mg  elemental zinc) 220 (50 Zn) MG capsule Take 1 capsule (220 mg total) by mouth daily.       Verbal and written Discharge instructions given to the patient. Wound care per Discharge AVS  Follow-up Information     [facility] Follow up.   Why: 3-4 weeks. The office will call you with your appointment (sent) Contact information: [address] [place] [address] [phone]        [facility] Follow up.   Why: HHRN arranged for home wound VAC drsg changes  2x/week- first drsg/start of care will be for 4/2- they will contact you to schedule visit (if you have any problems before [facility] starts you will need to call the [facility] office) Contact information: [address] [place], [place] [address] Phone: [phone]        [facility]- home wound VAC Follow up.   Why: Important Resources and Contacts  General Questions: [phone] Order Supplies: [phone], option 2, then dial ext. [phone] 24/7 Technical Support: [phone], option 3. Help with V.A.C. Therapy unit such as changing  full canister, charging the unit, therapy alarms, troubleshooting dressing applications or questions  regarding safety. Reach out to your health care  provider managing your V.A.C. Therapy for any  questions directly related to your therapy Contact information: If unit alarms or if experiencing issues to contact the home health, wound  care clinic, or physician's office that is managing the V.A.C. Therapy.  If unsuccessful, call [facility] Technical Support at [phone], option 3.                Signed: Mosetta Pigeon 08/21/2023, 10:36 AM

## 2023-08-22 ENCOUNTER — Telehealth: Payer: Self-pay

## 2023-08-22 LAB — AEROBIC/ANAEROBIC CULTURE W GRAM STAIN (SURGICAL/DEEP WOUND): Gram Stain: NONE SEEN

## 2023-08-22 NOTE — Telephone Encounter (Signed)
 Triage: -pt called stating his wound vac popped off. -HH scheduled to come out for wound vac change 04/02 per d/c summary note.  Pt states he did talk to them and told him they would call him after 5 to set a time. -instructed pt to put a W-T-D bandage on. Instructed pt how to perform and he stated he could do that, he had gauze at home just needed to get some saline. -confirms understanding he will call the office if he has not heard from [facility] RN by 10 am so he can make arrangements to come in the office to have it reapplied.

## 2023-08-23 ENCOUNTER — Encounter

## 2023-08-23 ENCOUNTER — Other Ambulatory Visit: Payer: Self-pay | Admitting: Physician Assistant

## 2023-08-23 ENCOUNTER — Telehealth: Payer: Self-pay

## 2023-08-23 DIAGNOSIS — N2581 Secondary hyperparathyroidism of renal origin: Secondary | ICD-10-CM | POA: Diagnosis not present

## 2023-08-23 DIAGNOSIS — D631 Anemia in chronic kidney disease: Secondary | ICD-10-CM | POA: Diagnosis not present

## 2023-08-23 DIAGNOSIS — E1122 Type 2 diabetes mellitus with diabetic chronic kidney disease: Secondary | ICD-10-CM | POA: Diagnosis not present

## 2023-08-23 DIAGNOSIS — Z6841 Body Mass Index (BMI) 40.0 and over, adult: Secondary | ICD-10-CM | POA: Diagnosis not present

## 2023-08-23 DIAGNOSIS — Z7901 Long term (current) use of anticoagulants: Secondary | ICD-10-CM | POA: Diagnosis not present

## 2023-08-23 DIAGNOSIS — L02413 Cutaneous abscess of right upper limb: Secondary | ICD-10-CM | POA: Diagnosis not present

## 2023-08-23 DIAGNOSIS — I120 Hypertensive chronic kidney disease with stage 5 chronic kidney disease or end stage renal disease: Secondary | ICD-10-CM | POA: Diagnosis not present

## 2023-08-23 DIAGNOSIS — E785 Hyperlipidemia, unspecified: Secondary | ICD-10-CM | POA: Diagnosis not present

## 2023-08-23 DIAGNOSIS — D509 Iron deficiency anemia, unspecified: Secondary | ICD-10-CM | POA: Diagnosis not present

## 2023-08-23 DIAGNOSIS — T827XXA Infection and inflammatory reaction due to other cardiac and vascular devices, implants and grafts, initial encounter: Secondary | ICD-10-CM | POA: Diagnosis not present

## 2023-08-23 DIAGNOSIS — K219 Gastro-esophageal reflux disease without esophagitis: Secondary | ICD-10-CM | POA: Diagnosis not present

## 2023-08-23 DIAGNOSIS — Z7982 Long term (current) use of aspirin: Secondary | ICD-10-CM | POA: Diagnosis not present

## 2023-08-23 DIAGNOSIS — Z792 Long term (current) use of antibiotics: Secondary | ICD-10-CM | POA: Diagnosis not present

## 2023-08-23 DIAGNOSIS — N186 End stage renal disease: Secondary | ICD-10-CM | POA: Diagnosis not present

## 2023-08-23 DIAGNOSIS — Z992 Dependence on renal dialysis: Secondary | ICD-10-CM | POA: Diagnosis not present

## 2023-08-23 DIAGNOSIS — Z794 Long term (current) use of insulin: Secondary | ICD-10-CM | POA: Diagnosis not present

## 2023-08-23 MED ORDER — OXYCODONE-ACETAMINOPHEN 5-325 MG PO TABS: 1.0000 | ORAL_TABLET | Freq: Three times a day (TID) | ORAL | 0 refills | Status: DC | PRN

## 2023-08-23 NOTE — Telephone Encounter (Signed)
 Medication Refill: -pt called requesting more medication -returned call to pt who states they changed his wound vac today and they had to stuff that black foam in there so its real painful.  He states a has a few pill left but wanted to get a refill before he runs out. -PA consulted. Limited refill sent in to pharmacy. -pt educated in reducing use of narcotic pain medication and try to extend this prescription to cover until next office visit.  If worsening pain is reported, pt will need appt to evaluate the area.

## 2023-08-25 ENCOUNTER — Other Ambulatory Visit: Payer: Self-pay | Admitting: Student

## 2023-08-25 DIAGNOSIS — Z7901 Long term (current) use of anticoagulants: Secondary | ICD-10-CM | POA: Diagnosis not present

## 2023-08-25 DIAGNOSIS — E1122 Type 2 diabetes mellitus with diabetic chronic kidney disease: Secondary | ICD-10-CM | POA: Diagnosis not present

## 2023-08-25 DIAGNOSIS — Z794 Long term (current) use of insulin: Secondary | ICD-10-CM | POA: Diagnosis not present

## 2023-08-25 DIAGNOSIS — D631 Anemia in chronic kidney disease: Secondary | ICD-10-CM | POA: Diagnosis not present

## 2023-08-25 DIAGNOSIS — T827XXA Infection and inflammatory reaction due to other cardiac and vascular devices, implants and grafts, initial encounter: Secondary | ICD-10-CM | POA: Diagnosis not present

## 2023-08-25 DIAGNOSIS — Z792 Long term (current) use of antibiotics: Secondary | ICD-10-CM | POA: Diagnosis not present

## 2023-08-25 DIAGNOSIS — L02413 Cutaneous abscess of right upper limb: Secondary | ICD-10-CM | POA: Diagnosis not present

## 2023-08-25 DIAGNOSIS — N186 End stage renal disease: Secondary | ICD-10-CM | POA: Diagnosis not present

## 2023-08-25 DIAGNOSIS — Z7982 Long term (current) use of aspirin: Secondary | ICD-10-CM | POA: Diagnosis not present

## 2023-08-25 DIAGNOSIS — N2581 Secondary hyperparathyroidism of renal origin: Secondary | ICD-10-CM | POA: Diagnosis not present

## 2023-08-25 DIAGNOSIS — I120 Hypertensive chronic kidney disease with stage 5 chronic kidney disease or end stage renal disease: Secondary | ICD-10-CM | POA: Diagnosis not present

## 2023-08-25 DIAGNOSIS — Z6841 Body Mass Index (BMI) 40.0 and over, adult: Secondary | ICD-10-CM | POA: Diagnosis not present

## 2023-08-25 DIAGNOSIS — E785 Hyperlipidemia, unspecified: Secondary | ICD-10-CM | POA: Diagnosis not present

## 2023-08-25 DIAGNOSIS — D509 Iron deficiency anemia, unspecified: Secondary | ICD-10-CM | POA: Diagnosis not present

## 2023-08-25 DIAGNOSIS — Z992 Dependence on renal dialysis: Secondary | ICD-10-CM | POA: Diagnosis not present

## 2023-08-25 DIAGNOSIS — K219 Gastro-esophageal reflux disease without esophagitis: Secondary | ICD-10-CM | POA: Diagnosis not present

## 2023-08-28 DIAGNOSIS — Z7982 Long term (current) use of aspirin: Secondary | ICD-10-CM | POA: Diagnosis not present

## 2023-08-28 DIAGNOSIS — N2581 Secondary hyperparathyroidism of renal origin: Secondary | ICD-10-CM | POA: Diagnosis not present

## 2023-08-28 DIAGNOSIS — I120 Hypertensive chronic kidney disease with stage 5 chronic kidney disease or end stage renal disease: Secondary | ICD-10-CM | POA: Diagnosis not present

## 2023-08-28 DIAGNOSIS — E1122 Type 2 diabetes mellitus with diabetic chronic kidney disease: Secondary | ICD-10-CM | POA: Diagnosis not present

## 2023-08-28 DIAGNOSIS — N186 End stage renal disease: Secondary | ICD-10-CM | POA: Diagnosis not present

## 2023-08-28 DIAGNOSIS — D509 Iron deficiency anemia, unspecified: Secondary | ICD-10-CM | POA: Diagnosis not present

## 2023-08-28 DIAGNOSIS — Z792 Long term (current) use of antibiotics: Secondary | ICD-10-CM | POA: Diagnosis not present

## 2023-08-28 DIAGNOSIS — K219 Gastro-esophageal reflux disease without esophagitis: Secondary | ICD-10-CM | POA: Diagnosis not present

## 2023-08-28 DIAGNOSIS — Z992 Dependence on renal dialysis: Secondary | ICD-10-CM | POA: Diagnosis not present

## 2023-08-28 DIAGNOSIS — D631 Anemia in chronic kidney disease: Secondary | ICD-10-CM | POA: Diagnosis not present

## 2023-08-28 DIAGNOSIS — Z6841 Body Mass Index (BMI) 40.0 and over, adult: Secondary | ICD-10-CM | POA: Diagnosis not present

## 2023-08-28 DIAGNOSIS — Z7901 Long term (current) use of anticoagulants: Secondary | ICD-10-CM | POA: Diagnosis not present

## 2023-08-28 DIAGNOSIS — Z794 Long term (current) use of insulin: Secondary | ICD-10-CM | POA: Diagnosis not present

## 2023-08-28 DIAGNOSIS — T827XXA Infection and inflammatory reaction due to other cardiac and vascular devices, implants and grafts, initial encounter: Secondary | ICD-10-CM | POA: Diagnosis not present

## 2023-08-28 DIAGNOSIS — L02413 Cutaneous abscess of right upper limb: Secondary | ICD-10-CM | POA: Diagnosis not present

## 2023-08-28 DIAGNOSIS — E785 Hyperlipidemia, unspecified: Secondary | ICD-10-CM | POA: Diagnosis not present

## 2023-08-30 DIAGNOSIS — E1122 Type 2 diabetes mellitus with diabetic chronic kidney disease: Secondary | ICD-10-CM | POA: Diagnosis not present

## 2023-08-30 DIAGNOSIS — Z794 Long term (current) use of insulin: Secondary | ICD-10-CM | POA: Diagnosis not present

## 2023-08-30 DIAGNOSIS — Z792 Long term (current) use of antibiotics: Secondary | ICD-10-CM | POA: Diagnosis not present

## 2023-08-30 DIAGNOSIS — Z7901 Long term (current) use of anticoagulants: Secondary | ICD-10-CM | POA: Diagnosis not present

## 2023-08-30 DIAGNOSIS — D509 Iron deficiency anemia, unspecified: Secondary | ICD-10-CM | POA: Diagnosis not present

## 2023-08-30 DIAGNOSIS — N186 End stage renal disease: Secondary | ICD-10-CM | POA: Diagnosis not present

## 2023-08-30 DIAGNOSIS — Z6841 Body Mass Index (BMI) 40.0 and over, adult: Secondary | ICD-10-CM | POA: Diagnosis not present

## 2023-08-30 DIAGNOSIS — K219 Gastro-esophageal reflux disease without esophagitis: Secondary | ICD-10-CM | POA: Diagnosis not present

## 2023-08-30 DIAGNOSIS — E785 Hyperlipidemia, unspecified: Secondary | ICD-10-CM | POA: Diagnosis not present

## 2023-08-30 DIAGNOSIS — N2581 Secondary hyperparathyroidism of renal origin: Secondary | ICD-10-CM | POA: Diagnosis not present

## 2023-08-30 DIAGNOSIS — T827XXA Infection and inflammatory reaction due to other cardiac and vascular devices, implants and grafts, initial encounter: Secondary | ICD-10-CM | POA: Diagnosis not present

## 2023-08-30 DIAGNOSIS — Z7982 Long term (current) use of aspirin: Secondary | ICD-10-CM | POA: Diagnosis not present

## 2023-08-30 DIAGNOSIS — I120 Hypertensive chronic kidney disease with stage 5 chronic kidney disease or end stage renal disease: Secondary | ICD-10-CM | POA: Diagnosis not present

## 2023-08-30 DIAGNOSIS — D631 Anemia in chronic kidney disease: Secondary | ICD-10-CM | POA: Diagnosis not present

## 2023-08-30 DIAGNOSIS — Z992 Dependence on renal dialysis: Secondary | ICD-10-CM | POA: Diagnosis not present

## 2023-08-30 DIAGNOSIS — L02413 Cutaneous abscess of right upper limb: Secondary | ICD-10-CM | POA: Diagnosis not present

## 2023-09-01 DIAGNOSIS — Z794 Long term (current) use of insulin: Secondary | ICD-10-CM | POA: Diagnosis not present

## 2023-09-01 DIAGNOSIS — N2581 Secondary hyperparathyroidism of renal origin: Secondary | ICD-10-CM | POA: Diagnosis not present

## 2023-09-01 DIAGNOSIS — I120 Hypertensive chronic kidney disease with stage 5 chronic kidney disease or end stage renal disease: Secondary | ICD-10-CM | POA: Diagnosis not present

## 2023-09-01 DIAGNOSIS — D509 Iron deficiency anemia, unspecified: Secondary | ICD-10-CM | POA: Diagnosis not present

## 2023-09-01 DIAGNOSIS — Z7982 Long term (current) use of aspirin: Secondary | ICD-10-CM | POA: Diagnosis not present

## 2023-09-01 DIAGNOSIS — D631 Anemia in chronic kidney disease: Secondary | ICD-10-CM | POA: Diagnosis not present

## 2023-09-01 DIAGNOSIS — L02413 Cutaneous abscess of right upper limb: Secondary | ICD-10-CM | POA: Diagnosis not present

## 2023-09-01 DIAGNOSIS — T827XXA Infection and inflammatory reaction due to other cardiac and vascular devices, implants and grafts, initial encounter: Secondary | ICD-10-CM | POA: Diagnosis not present

## 2023-09-01 DIAGNOSIS — Z7901 Long term (current) use of anticoagulants: Secondary | ICD-10-CM | POA: Diagnosis not present

## 2023-09-01 DIAGNOSIS — E1122 Type 2 diabetes mellitus with diabetic chronic kidney disease: Secondary | ICD-10-CM | POA: Diagnosis not present

## 2023-09-01 DIAGNOSIS — Z6841 Body Mass Index (BMI) 40.0 and over, adult: Secondary | ICD-10-CM | POA: Diagnosis not present

## 2023-09-01 DIAGNOSIS — E785 Hyperlipidemia, unspecified: Secondary | ICD-10-CM | POA: Diagnosis not present

## 2023-09-01 DIAGNOSIS — N186 End stage renal disease: Secondary | ICD-10-CM | POA: Diagnosis not present

## 2023-09-01 DIAGNOSIS — K219 Gastro-esophageal reflux disease without esophagitis: Secondary | ICD-10-CM | POA: Diagnosis not present

## 2023-09-01 DIAGNOSIS — Z792 Long term (current) use of antibiotics: Secondary | ICD-10-CM | POA: Diagnosis not present

## 2023-09-01 DIAGNOSIS — Z992 Dependence on renal dialysis: Secondary | ICD-10-CM | POA: Diagnosis not present

## 2023-09-04 ENCOUNTER — Telehealth: Payer: Self-pay

## 2023-09-04 DIAGNOSIS — N186 End stage renal disease: Secondary | ICD-10-CM | POA: Diagnosis not present

## 2023-09-04 DIAGNOSIS — E785 Hyperlipidemia, unspecified: Secondary | ICD-10-CM | POA: Diagnosis not present

## 2023-09-04 DIAGNOSIS — N2581 Secondary hyperparathyroidism of renal origin: Secondary | ICD-10-CM | POA: Diagnosis not present

## 2023-09-04 DIAGNOSIS — Z992 Dependence on renal dialysis: Secondary | ICD-10-CM | POA: Diagnosis not present

## 2023-09-04 DIAGNOSIS — L02413 Cutaneous abscess of right upper limb: Secondary | ICD-10-CM | POA: Diagnosis not present

## 2023-09-04 DIAGNOSIS — D509 Iron deficiency anemia, unspecified: Secondary | ICD-10-CM | POA: Diagnosis not present

## 2023-09-04 DIAGNOSIS — Z6841 Body Mass Index (BMI) 40.0 and over, adult: Secondary | ICD-10-CM | POA: Diagnosis not present

## 2023-09-04 DIAGNOSIS — Z794 Long term (current) use of insulin: Secondary | ICD-10-CM | POA: Diagnosis not present

## 2023-09-04 DIAGNOSIS — D631 Anemia in chronic kidney disease: Secondary | ICD-10-CM | POA: Diagnosis not present

## 2023-09-04 DIAGNOSIS — Z7982 Long term (current) use of aspirin: Secondary | ICD-10-CM | POA: Diagnosis not present

## 2023-09-04 DIAGNOSIS — Z792 Long term (current) use of antibiotics: Secondary | ICD-10-CM | POA: Diagnosis not present

## 2023-09-04 DIAGNOSIS — I120 Hypertensive chronic kidney disease with stage 5 chronic kidney disease or end stage renal disease: Secondary | ICD-10-CM | POA: Diagnosis not present

## 2023-09-04 DIAGNOSIS — K219 Gastro-esophageal reflux disease without esophagitis: Secondary | ICD-10-CM | POA: Diagnosis not present

## 2023-09-04 DIAGNOSIS — Z7901 Long term (current) use of anticoagulants: Secondary | ICD-10-CM | POA: Diagnosis not present

## 2023-09-04 DIAGNOSIS — E1122 Type 2 diabetes mellitus with diabetic chronic kidney disease: Secondary | ICD-10-CM | POA: Diagnosis not present

## 2023-09-04 DIAGNOSIS — T827XXA Infection and inflammatory reaction due to other cardiac and vascular devices, implants and grafts, initial encounter: Secondary | ICD-10-CM | POA: Diagnosis not present

## 2023-09-04 NOTE — Telephone Encounter (Signed)
 Spoke with [facility] nurse Rice Chamorro, to give her VO for wet-dry dressing. She states pt's wound is too small now for wound vac and is much improved.

## 2023-09-06 DIAGNOSIS — K219 Gastro-esophageal reflux disease without esophagitis: Secondary | ICD-10-CM | POA: Diagnosis not present

## 2023-09-06 DIAGNOSIS — Z794 Long term (current) use of insulin: Secondary | ICD-10-CM | POA: Diagnosis not present

## 2023-09-06 DIAGNOSIS — D631 Anemia in chronic kidney disease: Secondary | ICD-10-CM | POA: Diagnosis not present

## 2023-09-06 DIAGNOSIS — Z7982 Long term (current) use of aspirin: Secondary | ICD-10-CM | POA: Diagnosis not present

## 2023-09-06 DIAGNOSIS — E785 Hyperlipidemia, unspecified: Secondary | ICD-10-CM | POA: Diagnosis not present

## 2023-09-06 DIAGNOSIS — Z792 Long term (current) use of antibiotics: Secondary | ICD-10-CM | POA: Diagnosis not present

## 2023-09-06 DIAGNOSIS — D509 Iron deficiency anemia, unspecified: Secondary | ICD-10-CM | POA: Diagnosis not present

## 2023-09-06 DIAGNOSIS — N186 End stage renal disease: Secondary | ICD-10-CM | POA: Diagnosis not present

## 2023-09-06 DIAGNOSIS — Z7901 Long term (current) use of anticoagulants: Secondary | ICD-10-CM | POA: Diagnosis not present

## 2023-09-06 DIAGNOSIS — E1122 Type 2 diabetes mellitus with diabetic chronic kidney disease: Secondary | ICD-10-CM | POA: Diagnosis not present

## 2023-09-06 DIAGNOSIS — I120 Hypertensive chronic kidney disease with stage 5 chronic kidney disease or end stage renal disease: Secondary | ICD-10-CM | POA: Diagnosis not present

## 2023-09-06 DIAGNOSIS — Z992 Dependence on renal dialysis: Secondary | ICD-10-CM | POA: Diagnosis not present

## 2023-09-06 DIAGNOSIS — T827XXA Infection and inflammatory reaction due to other cardiac and vascular devices, implants and grafts, initial encounter: Secondary | ICD-10-CM | POA: Diagnosis not present

## 2023-09-06 DIAGNOSIS — N2581 Secondary hyperparathyroidism of renal origin: Secondary | ICD-10-CM | POA: Diagnosis not present

## 2023-09-06 DIAGNOSIS — L02413 Cutaneous abscess of right upper limb: Secondary | ICD-10-CM | POA: Diagnosis not present

## 2023-09-06 DIAGNOSIS — Z6841 Body Mass Index (BMI) 40.0 and over, adult: Secondary | ICD-10-CM | POA: Diagnosis not present

## 2023-09-08 DIAGNOSIS — D509 Iron deficiency anemia, unspecified: Secondary | ICD-10-CM | POA: Diagnosis not present

## 2023-09-08 DIAGNOSIS — N186 End stage renal disease: Secondary | ICD-10-CM | POA: Diagnosis not present

## 2023-09-08 DIAGNOSIS — D631 Anemia in chronic kidney disease: Secondary | ICD-10-CM | POA: Diagnosis not present

## 2023-09-08 DIAGNOSIS — Z7982 Long term (current) use of aspirin: Secondary | ICD-10-CM | POA: Diagnosis not present

## 2023-09-08 DIAGNOSIS — Z992 Dependence on renal dialysis: Secondary | ICD-10-CM | POA: Diagnosis not present

## 2023-09-08 DIAGNOSIS — E785 Hyperlipidemia, unspecified: Secondary | ICD-10-CM | POA: Diagnosis not present

## 2023-09-08 DIAGNOSIS — N2581 Secondary hyperparathyroidism of renal origin: Secondary | ICD-10-CM | POA: Diagnosis not present

## 2023-09-08 DIAGNOSIS — Z7901 Long term (current) use of anticoagulants: Secondary | ICD-10-CM | POA: Diagnosis not present

## 2023-09-08 DIAGNOSIS — L02413 Cutaneous abscess of right upper limb: Secondary | ICD-10-CM | POA: Diagnosis not present

## 2023-09-08 DIAGNOSIS — T827XXA Infection and inflammatory reaction due to other cardiac and vascular devices, implants and grafts, initial encounter: Secondary | ICD-10-CM | POA: Diagnosis not present

## 2023-09-08 DIAGNOSIS — Z794 Long term (current) use of insulin: Secondary | ICD-10-CM | POA: Diagnosis not present

## 2023-09-08 DIAGNOSIS — Z792 Long term (current) use of antibiotics: Secondary | ICD-10-CM | POA: Diagnosis not present

## 2023-09-08 DIAGNOSIS — Z6841 Body Mass Index (BMI) 40.0 and over, adult: Secondary | ICD-10-CM | POA: Diagnosis not present

## 2023-09-08 DIAGNOSIS — I120 Hypertensive chronic kidney disease with stage 5 chronic kidney disease or end stage renal disease: Secondary | ICD-10-CM | POA: Diagnosis not present

## 2023-09-08 DIAGNOSIS — K219 Gastro-esophageal reflux disease without esophagitis: Secondary | ICD-10-CM | POA: Diagnosis not present

## 2023-09-08 DIAGNOSIS — E1122 Type 2 diabetes mellitus with diabetic chronic kidney disease: Secondary | ICD-10-CM | POA: Diagnosis not present

## 2023-09-11 DIAGNOSIS — N186 End stage renal disease: Secondary | ICD-10-CM | POA: Diagnosis not present

## 2023-09-11 DIAGNOSIS — Z992 Dependence on renal dialysis: Secondary | ICD-10-CM | POA: Diagnosis not present

## 2023-09-11 DIAGNOSIS — D631 Anemia in chronic kidney disease: Secondary | ICD-10-CM | POA: Diagnosis not present

## 2023-09-11 DIAGNOSIS — E785 Hyperlipidemia, unspecified: Secondary | ICD-10-CM | POA: Diagnosis not present

## 2023-09-11 DIAGNOSIS — K219 Gastro-esophageal reflux disease without esophagitis: Secondary | ICD-10-CM | POA: Diagnosis not present

## 2023-09-11 DIAGNOSIS — I120 Hypertensive chronic kidney disease with stage 5 chronic kidney disease or end stage renal disease: Secondary | ICD-10-CM | POA: Diagnosis not present

## 2023-09-11 DIAGNOSIS — D509 Iron deficiency anemia, unspecified: Secondary | ICD-10-CM | POA: Diagnosis not present

## 2023-09-11 DIAGNOSIS — E1122 Type 2 diabetes mellitus with diabetic chronic kidney disease: Secondary | ICD-10-CM | POA: Diagnosis not present

## 2023-09-11 DIAGNOSIS — Z7982 Long term (current) use of aspirin: Secondary | ICD-10-CM | POA: Diagnosis not present

## 2023-09-11 DIAGNOSIS — L02413 Cutaneous abscess of right upper limb: Secondary | ICD-10-CM | POA: Diagnosis not present

## 2023-09-11 DIAGNOSIS — Z794 Long term (current) use of insulin: Secondary | ICD-10-CM | POA: Diagnosis not present

## 2023-09-11 DIAGNOSIS — Z6841 Body Mass Index (BMI) 40.0 and over, adult: Secondary | ICD-10-CM | POA: Diagnosis not present

## 2023-09-11 DIAGNOSIS — T827XXA Infection and inflammatory reaction due to other cardiac and vascular devices, implants and grafts, initial encounter: Secondary | ICD-10-CM | POA: Diagnosis not present

## 2023-09-11 DIAGNOSIS — Z7901 Long term (current) use of anticoagulants: Secondary | ICD-10-CM | POA: Diagnosis not present

## 2023-09-11 DIAGNOSIS — Z792 Long term (current) use of antibiotics: Secondary | ICD-10-CM | POA: Diagnosis not present

## 2023-09-11 DIAGNOSIS — N2581 Secondary hyperparathyroidism of renal origin: Secondary | ICD-10-CM | POA: Diagnosis not present

## 2023-09-12 ENCOUNTER — Other Ambulatory Visit: Payer: Self-pay | Admitting: Student

## 2023-09-12 DIAGNOSIS — Z794 Long term (current) use of insulin: Secondary | ICD-10-CM

## 2023-09-12 IMAGING — MR MR FOOT*L* W/O CM
5 of 7 series · 32 of 40 positions shown · non-contrast
Comparison: CT left foot from yesterday.

CLINICAL DATA: Left great toe ulcer.  Left foot pain and redness.

EXAM:
MRI OF THE LEFT FOOT WITHOUT CONTRAST
TECHNIQUE: Multiplanar, multisequence MR imaging of the left forefoot was
performed. No intravenous contrast was administered.

[Series 4: T1 · coronal · left · 3.0mm · 0.47mm/px · 8 of 50 slices shown (1 of 2)]
[im 1/50]
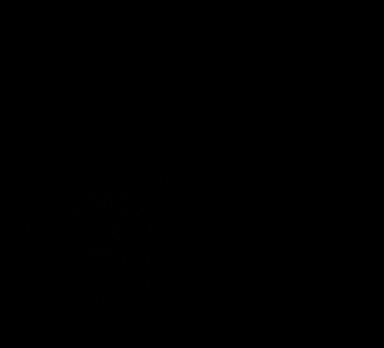
[im 8/50]
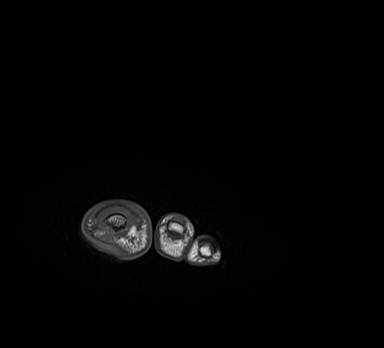
[im 15/50]
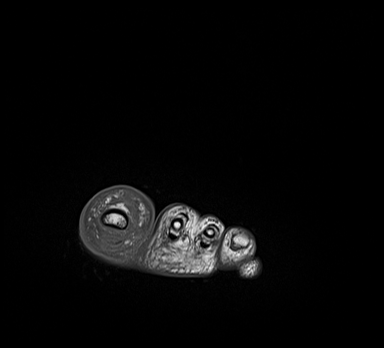
[im 22/50]
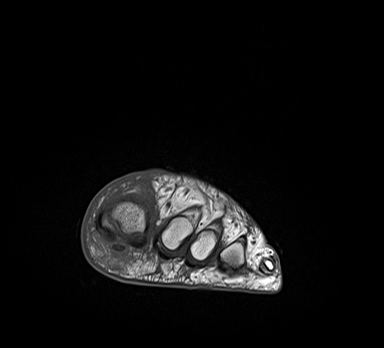
[im 29/50]
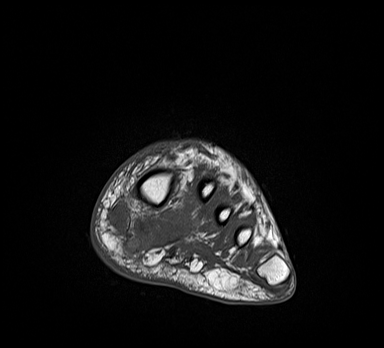
[im 36/50]
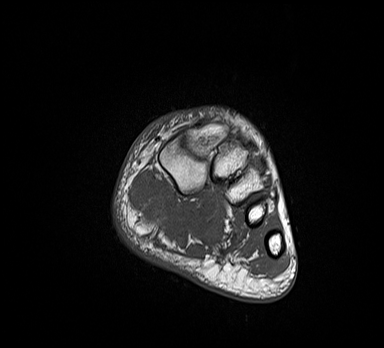
[im 43/50]
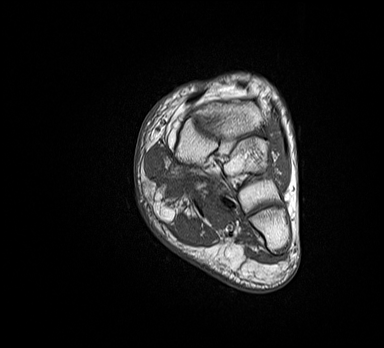
[im 50/50]
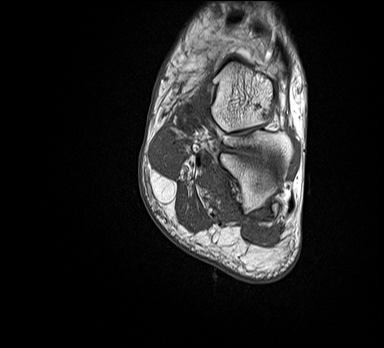

[Series 5: T2 fat-sat · coronal · left · 3.0mm · 0.49mm/px · 8 of 50 slices shown (1 of 2)]
[im 1/50]
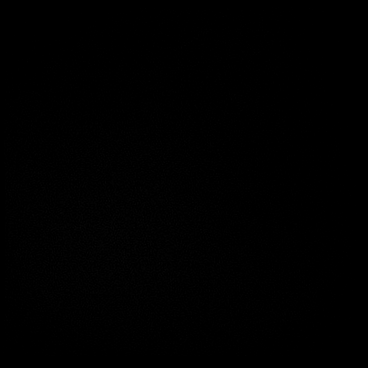
[im 8/50]
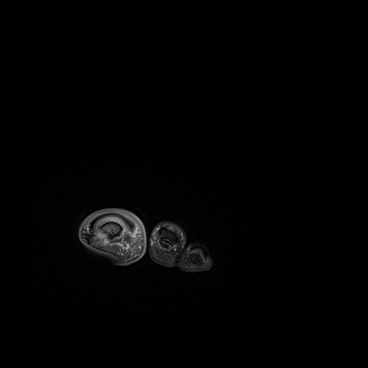
[im 15/50]
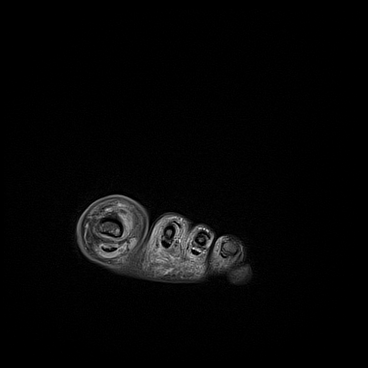
[im 22/50]
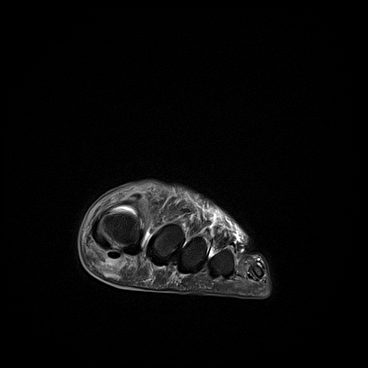
[im 29/50]
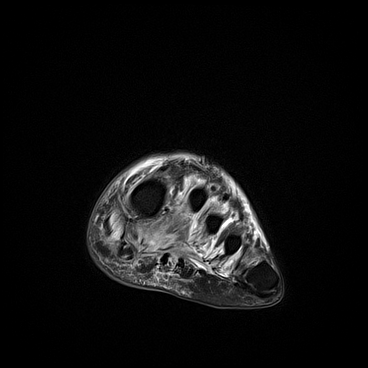
[im 36/50]
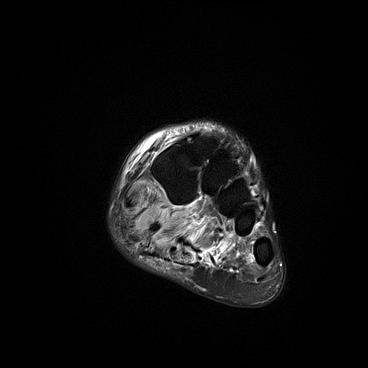
[im 43/50]
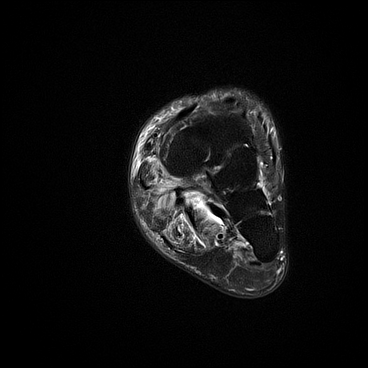
[im 50/50]
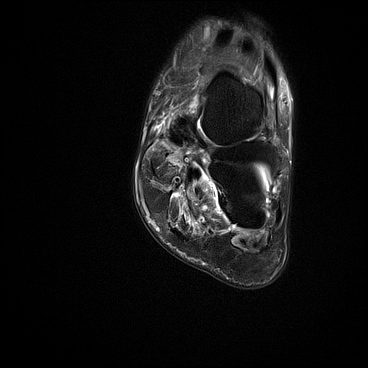

[Series 6: T1 · axial · left · 3.0mm · 0.52mm/px · z∈[-144,-20]mm · 6 of 34 slices shown (2 of 2)]
[im 1/34]
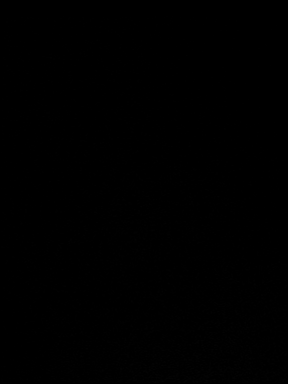
[im 7/34]
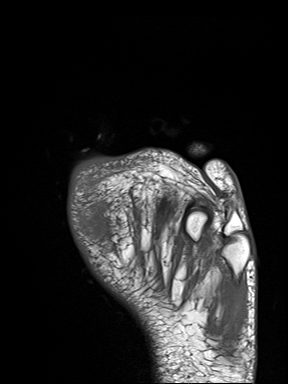
[im 14/34]
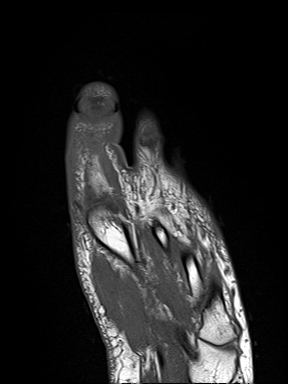
[im 20/34]
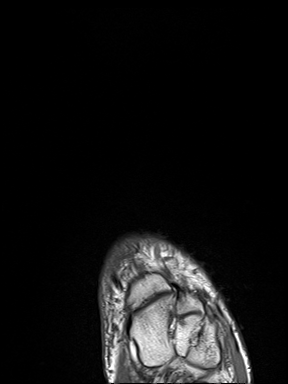
[im 27/34]
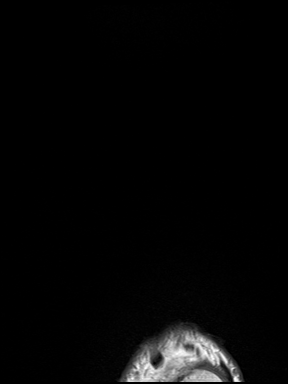
[im 34/34]
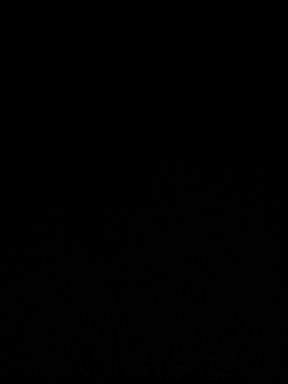

[Series 7: T2 fat-sat · axial · left · 3.0mm · 0.48mm/px · z∈[-137,-13]mm · 5 of 32 slices shown (2 of 2)]
[im 1/32]
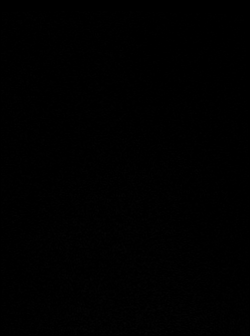
[im 8/32]
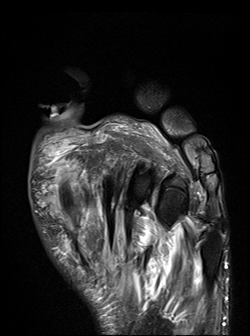
[im 16/32]
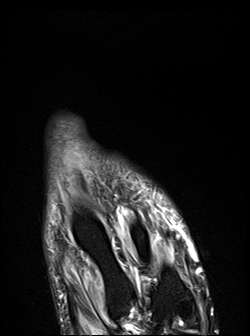
[im 24/32]
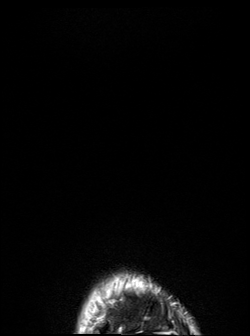
[im 32/32]
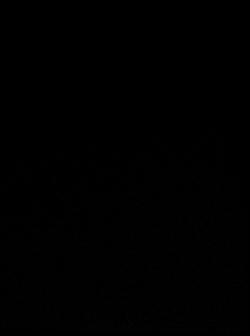

[Series 8: STIR · sagittal · left · 3.0mm · 0.70mm/px · 5 of 31 slices shown]
[im 1/31]
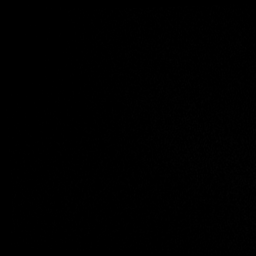
[im 8/31]
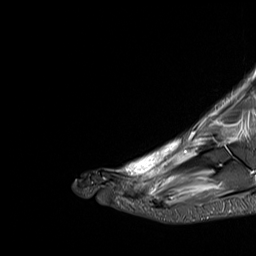
[im 16/31]
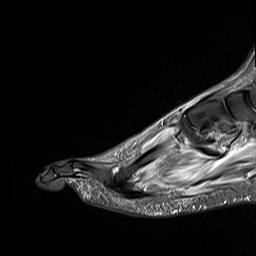
[im 23/31]
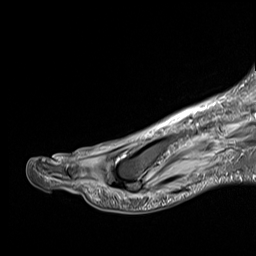
[im 31/31]
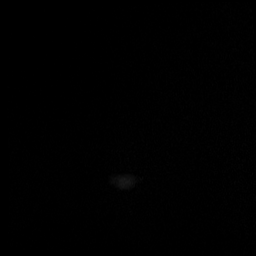

[32 of 40 positions shown; findings below may reference images not displayed]

FINDINGS: Bones/Joint/Cartilage

Fairly confluent marrow edema involving the first proximal and
distal phalanges, as well as the hallux sesamoids, with patchy T1
hypointensity, consistent with osteomyelitis. No fracture or
dislocation. Joint spaces are preserved. No significant joint
effusion.

Ligaments

Collateral ligaments are intact.

Muscles and Tendons
Partially torn distal flexor hallucis longus tendon with small
amount of fluid surrounding the tendon. Remaining flexor and
extensor tendons are intact. Increased T2 signal within the
intrinsic muscles of the forefoot, nonspecific, but likely related
to diabetic muscle changes.

Soft tissue
Soft tissue ulceration at the plantar base of the first IP joint.
2.0 x 0.7 x 1.3 cm fluid collection along the lateral aspect of the
first proximal phalanx (series 7, image 22).
IMPRESSION: 1. Soft tissue ulceration at the plantar base of the first IP joint
with underlying osteomyelitis of the first proximal and distal
phalanges, as well as the hallux sesamoids.
2. 2.0 cm abscess along the lateral aspect of the first proximal
phalanx.
3. Partially torn distal flexor hallucis longus tendon with mild
infectious tenosynovitis.

## 2023-09-13 ENCOUNTER — Encounter: Payer: Self-pay | Admitting: Family

## 2023-09-13 ENCOUNTER — Ambulatory Visit (INDEPENDENT_AMBULATORY_CARE_PROVIDER_SITE_OTHER): Admitting: Physician Assistant

## 2023-09-13 ENCOUNTER — Ambulatory Visit: Admitting: Family

## 2023-09-13 VITALS — BP 136/94 | HR 93 | Temp 98.1°F | Ht 74.0 in | Wt 360.4 lb

## 2023-09-13 DIAGNOSIS — Z89432 Acquired absence of left foot: Secondary | ICD-10-CM

## 2023-09-13 DIAGNOSIS — L97521 Non-pressure chronic ulcer of other part of left foot limited to breakdown of skin: Secondary | ICD-10-CM

## 2023-09-13 DIAGNOSIS — N186 End stage renal disease: Secondary | ICD-10-CM

## 2023-09-13 DIAGNOSIS — B351 Tinea unguium: Secondary | ICD-10-CM

## 2023-09-13 NOTE — Progress Notes (Signed)
 Office Visit Note   Patient: Paul [name]           Date of Birth: 10-Feb-1991           MRN: [id number] Visit Date: 09/13/2023              Requested by: Vallorie Gayer, DO [address] [place],  [place] [address] PCP: Vallorie Gayer, DO  Chief Complaint  Patient presents with   Right Foot - Follow-up   Left Foot - Follow-up      HPI: The patient is a 33 year old gentleman seen status post left first ray amputation 11 months ago we have been seeing him for routine debridements  He also requests a nail trim today  Assessment & Plan: Visit Diagnoses: No diagnosis found.  Plan: Nail trimmed x 11 patient tolerated well.  The incision is fully healed he will follow-up in the office for debridement of hyperkeratotic tissue  Follow-Up Instructions: No follow-ups on file.   Ortho Exam  Patient is alert, oriented, no adenopathy, well-dressed, normal affect, normal respiratory effort. On examination left foot the first ray amputation has buildup of hyperkeratotic tissue which was debrided today with a 10 blade knife back to viable tissue there is no underlying open area this has fully epithelialized he does have thickened and discolored onychomycotic nails x 9 he is unable to safely trim his own nails these were trimmed today. Imaging: No results found. No images are attached to the encounter.  Labs: Lab Results  Component Value Date   HGBA1C 7.4 (H) 08/17/2023   HGBA1C 7.4 (H) 09/30/2022   HGBA1C 6.3 (H) 04/18/2022   REPTSTATUS 08/22/2023 FINAL 08/17/2023   GRAMSTAIN NO WBC SEEN FEW GRAM POSITIVE COCCI IN PAIRS  08/17/2023   CULT  08/17/2023    MODERATE STAPHYLOCOCCUS AUREUS FEW SERRATIA MARCESCENS NO ANAEROBES ISOLATED Performed at [facility], [address] [place], [place] [address]    [facility] STAPHYLOCOCCUS AUREUS 08/17/2023   LABORGA SERRATIA MARCESCENS 08/17/2023     Lab Results  Component Value Date   ALBUMIN  4.0 08/17/2023   ALBUMIN  3.3 (L)  09/30/2022   ALBUMIN  4.4 09/29/2022    Lab Results  Component Value Date   MG 2.8 (H) 09/26/2016   MG 2.8 (H) 09/25/2016   MG 2.8 (H) 09/24/2016   Lab Results  Component Value Date   VD25OH 6.8 (L) 09/14/2016    No results found for: "PREALBUMIN"    Latest Ref Rng & Units 08/17/2023    8:27 PM 08/17/2023   12:25 PM 08/01/2023   11:50 AM  CBC EXTENDED  WBC 4.0 - 10.5 K/uL 16.7     RBC 4.22 - 5.81 MIL/uL 3.98     Hemoglobin 13.0 - 17.0 g/dL 29.5  28.4  13.2   HCT 39.0 - 52.0 % 33.2  42.0  44.0   Platelets 150 - 400 K/uL 173        There is no height or weight on file to calculate BMI.  Orders:  No orders of the defined types were placed in this encounter.  No orders of the defined types were placed in this encounter.    Procedures: No procedures performed  Clinical Data: No additional findings.  ROS:  All other systems negative, except as noted in the HPI. Review of Systems  Objective: Vital Signs: There were no vitals taken for this visit.  Specialty Comments:  No specialty comments available.  PMFS History: Patient Active Problem List   Diagnosis  Date Noted   Infection of vascular bypass graft, initial encounter (HCC) 08/17/2023   Acute osteomyelitis of left foot (HCC) 09/30/2022   Sepsis (HCC) 09/29/2022   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   Thrombus 05/04/2022   Ischemia of left upper extremity 05/04/2022   Complication of vascular access for dialysis 04/19/2022   ESRD (end stage renal disease) (HCC) 04/19/2022   Failure of hemodialysis access (HCC) 04/18/2022   Left hand pain 09/22/2021   Diabetic foot infection (HCC)    Wound infection 05/07/2021   MSSA bacteremia 05/07/2021   Osteomyelitis (HCC) 05/07/2021   Abscess 05/07/2021   Sepsis due to cellulitis (HCC) 05/06/2021   Poor compliance with medication 03/17/2021   Fluid overload, unspecified 07/09/2020   Complication of vascular dialysis catheter 03/23/2020   Other  disorders of phosphorus metabolism 01/28/2020   Hypertension 01/08/2020   Hypocalcemia 10/29/2019   ESRD on dialysis (HCC) 11/16/2017   Pruritus, unspecified 04/04/2017   Unspecified protein-calorie malnutrition (HCC) 11/11/2016   Headache, unspecified 10/29/2016   Encounter for immunization 10/19/2016   Iron  deficiency anemia, unspecified 10/10/2016   Anemia in chronic kidney disease 09/30/2016   Coagulation defect, unspecified (HCC) 09/30/2016   Pneumonia due to Pseudomonas (HCC) 09/30/2016   Secondary hyperparathyroidism of renal origin (HCC) 09/30/2016   Hypothyroidism 10/29/2009   Type 2 diabetes mellitus (HCC) 04/14/2009   Hyperlipidemia 04/14/2009   OBESITY, MORBID 04/14/2009   Essential hypertension, benign 04/14/2009   Past Medical History:  Diagnosis Date   Acute hypoxemic respiratory failure (HCC) 09/11/2016   Anemia    Chronic kidney disease    ARF on CRF Dialysis T/TH/Sa   Complication of anesthesia    per patient blood pressure drops   Diabetes mellitus    Type II   End stage renal disease on dialysis (HCC)    [place]    GERD (gastroesophageal reflux disease)    diet controlled   HLD (hyperlipidemia)    Hypertension    Hypothyroidism    Morbid obesity (HCC)    Sacral wound    resolved   Thyroid  disease     Family History  Problem Relation Age of Onset   Heart disease Mother    Hypertension Mother     Past Surgical History:  Procedure Laterality Date   AMPUTATION Left 05/08/2021   Procedure: AMPUTATION GREAT TOE;  Surgeon: Timothy Ford, MD;  Location: [facility] OR;  Service: Orthopedics;  Laterality: Left;   AMPUTATION Left 10/01/2022   Procedure: LEFT FOOT AMPUTATION RAY 1st;  Surgeon: Timothy Ford, MD;  Location: [facility] OR;  Service: Orthopedics;  Laterality: Left;   APPLICATION OF WOUND VAC Right 08/17/2023   Procedure: APPLICATION, WOUND VAC;  Surgeon: Philipp Brawn, MD;  Location: [facility] OR;  Service: Vascular;  Laterality: Right;   AV FISTULA  PLACEMENT Left 09/26/2016   Procedure: LEFT UPPER ARM ARTERIOVENOUS (AV) FISTULA CREATION;  Surgeon: Richrd Char, MD;  Location: [facility] OR;  Service: Vascular;  Laterality: Left;   AV FISTULA PLACEMENT Left 12/02/2016   Procedure: INSERTION OF ARTERIOVENOUS GORE-TEX GRAFT LEFT UPPER  ARM USING A 4-7MM BY 45CM GRAFT ;  Surgeon: Mayo Speck, MD;  Location: [facility] OR;  Service: Vascular;  Laterality: Left;   AV FISTULA PLACEMENT Right 07/03/2020   Procedure: INSERTION OF ARTERIOVENOUS (AV) GORE-TEX GRAFT RIGHT UPPER ARM;  Surgeon: Young Hensen, MD;  Location: [facility] OR;  Service: Vascular;  Laterality: Right;   AV FISTULA PLACEMENT Left 07/02/2021   Procedure:  LEFT UPPER EXTREMITY ARTERIOVENOUS FISTULA CREATION;  Surgeon: Adine Hoof, MD;  Location[facility] OR;  Service: Vascular;  Laterality: Left;   AV FISTULA PLACEMENT Right 05/12/2023   Procedure: RIGHT UPPER EXTREMITY ARTERIOVENOUS (AV) FISTULA CREATION;  Surgeon: Adine Hoof, MD;  Location: [facility] OR;  Service: Vascular;  Laterality: Right;   AVGG REMOVAL Right 08/17/2023   Procedure: REMOVAL OF ARTERIOVENOUS GORETEX GRAFT (AVGG);  Surgeon: Philipp Brawn, MD;  Location: [facility] OR;  Service: Vascular;  Laterality: Right;   BASCILIC VEIN TRANSPOSITION Right 05/06/2020   Procedure: FIRST STAGE RIGHT BASCILIC VEIN TRANSPOSITION;  Surgeon: Margherita Shell, MD;  Location: [facility] OR;  Service: Vascular;  Laterality: Right;   BASCILIC VEIN TRANSPOSITION Left 08/20/2021   Procedure: LEFT ARM SECOND STAGE WITH ARTERIOVENOUS GRAFT;  Surgeon: Adine Hoof, MD;  Location: [facility] OR;  Service: Vascular;  Laterality: Left;   BASCILIC VEIN TRANSPOSITION Right 08/01/2023   Procedure: RIGHT UPPER ARM FISTULA LIGATION;  Surgeon: Adine Hoof, MD;  Location: [facility] OR;  Service: Vascular;  Laterality: Right;   DIALYSIS/PERMA CATHETER INSERTION N/A 06/21/2023   Procedure: DIALYSIS/PERMA CATHETER INSERTION;  Surgeon: Patrick Boor, MD;   [facility]: [facility] INVASIVE CV LAB;  Service: Cardiovascular;  Laterality: N/A;   DIALYSIS/PERMA CATHETER REMOVAL N/A 06/21/2023   Procedure: DIALYSIS/PERMA CATHETER REMOVAL;  Surgeon: Patrick Boor, MD;  Location: [facility] INVASIVE CV LAB;  Service: Cardiovascular;  Laterality: N/A;   EXCHANGE OF A DIALYSIS CATHETER Right 10/08/2016   Procedure: EXCHANGE OF A DIALYSIS CATHETER-RIGHT INTERNAL JUGULAR;  Surgeon: Arvil Lauber, MD;  Location: [facility] OR;  Service: Vascular;  Laterality: Right;   INSERTION OF ARTERIOVENOUS (AV) ARTEGRAFT ARM Right 08/01/2023   Procedure: INSERTION, GRAFT, ARTERIOVENOUS, RIGHT UPPER EXTREMITY USING GORE TEX;  Surgeon: Adine Hoof, MD;  Location: [facility] OR;  Service: Vascular;  Laterality: Right;   INSERTION OF DIALYSIS CATHETER Right 09/26/2016   Procedure: INSERTION OF DIALYSIS CATHETER - Right Internal Jugular Placement;  Surgeon: Richrd Char, MD;  Location: [facility] OR;  Service: Vascular;  Laterality: Right;   INSERTION OF DIALYSIS CATHETER Left 03/22/2018   Procedure: INSERTION OF DIALYSIS CATHETER;  Surgeon: Young Hensen, MD;  Location: [facility] OR;  Service: Vascular;  Laterality: Left;   INTRAOPERATIVE ARTERIOGRAM Left 05/04/2022   Procedure: INTRA OPERATIVE ARTERIOGRAM;  Surgeon: Kayla Part, MD;  Location: [facility] OR;  Service: Vascular;  Laterality: Left;   IR AV DIALY SHUNT INTRO NEEDLE/INTRAC INITIAL W/PTA/STENT/IMG LT Left 02/17/2023   IR AV DIALY SHUNT INTRO NEEDLE/INTRACATH INITIAL W/PTA/IMG LEFT  08/19/2022   IR AV DIALY SHUNT INTRO NEEDLE/INTRACATH INITIAL W/PTA/IMG LEFT  11/14/2022   IR DIALY SHUNT INTRO NEEDLE/INTRACATH INITIAL W/IMG LEFT Left 04/15/2022   IR FLUORO GUIDE CV LINE LEFT  04/19/2022   IR FLUORO GUIDE CV LINE LEFT  05/30/2022   IR FLUORO GUIDE CV LINE LEFT  03/24/2023   IR FLUORO GUIDE CV LINE RIGHT  03/26/2021   IR FLUORO GUIDE CV LINE RIGHT  05/13/2021   IR FLUORO GUIDE CV LINE RIGHT  05/04/2022   IR FLUORO GUIDE CV LINE RIGHT  02/17/2023   IR  REMOVAL TUN CV CATH W/O FL  05/11/2021   IR REMOVAL TUN CV CATH W/O FL  10/06/2021   IR THROMBECTOMY AV FISTULA W/THROMBOLYSIS/PTA INC/SHUNT/IMG LEFT Left 02/02/2022   IR THROMBECTOMY AV FISTULA W/THROMBOLYSIS/PTA INC/SHUNT/IMG LEFT Left 03/22/2022   IR THROMBECTOMY AV FISTULA W/THROMBOLYSIS/PTA INC/SHUNT/IMG LEFT Left 05/04/2022   IR THROMBECTOMY AV FISTULA W/THROMBOLYSIS/PTA INC/SHUNT/IMG LEFT Left  12/09/2022   IR THROMBECTOMY AV FISTULA W/THROMBOLYSIS/PTA/STENT INC/SHUNT/IMG LT Left 04/27/2022   IR US  GUIDE VASC ACCESS LEFT  02/02/2022   IR US  GUIDE VASC ACCESS LEFT  03/22/2022   IR US  GUIDE VASC ACCESS LEFT  04/15/2022   IR US  GUIDE VASC ACCESS LEFT  04/19/2022   IR US  GUIDE VASC ACCESS LEFT  04/27/2022   IR US  GUIDE VASC ACCESS LEFT  12/13/2022   IR US  GUIDE VASC ACCESS LEFT  02/21/2023   IR US  GUIDE VASC ACCESS RIGHT  03/26/2021   IR US  GUIDE VASC ACCESS RIGHT  05/13/2021   IR US  GUIDE VASC ACCESS RIGHT  05/04/2022   IR US  GUIDE VASC ACCESS RIGHT  02/17/2023   PERIPHERAL VASCULAR BALLOON ANGIOPLASTY  06/21/2023   Procedure: PERIPHERAL VASCULAR BALLOON ANGIOPLASTY;  Surgeon: Patrick Boor, MD;  Location: [facility] INVASIVE CV LAB;  Service: Cardiovascular;;  Innominate Vein   REVISON OF ARTERIOVENOUS FISTULA Left 03/22/2018   Procedure: LEFT UPPER EXTREMITY ARTERIOVENOUS  REVISION WITH GORE-TEX GRAFT.;  Surgeon: Young Hensen, MD;  Location: [facility] OR;  Service: Vascular;  Laterality: Left;   TEE WITHOUT CARDIOVERSION N/A 05/12/2021   Procedure: TRANSESOPHAGEAL ECHOCARDIOGRAM (TEE);  Surgeon: Jann Melody, MD;  Location: [facility] ENDOSCOPY;  Service: Cardiovascular;  Laterality: N/A;   THROMBECTOMY Left 08/26/2019   thrombectomy of LUA loop AVG   THROMBECTOMY AND REVISION OF ARTERIOVENTOUS (AV) GORETEX  GRAFT Right 07/27/2020   Procedure: INSERTION OF RIGHT ARM LOOP GRAFT WITH EXCISION OF RIGHT ARM BRACHIAL AXILLARY GRAFT;  Surgeon: Young Hensen, MD;  Location: [facility] OR;  Service: Vascular;   Laterality: Right;   THROMBECTOMY BRACHIAL ARTERY Left 05/04/2022   Procedure: LEFT ARM RADIAL BRACHIAL ARTERY EMBOLECTOMY;  Surgeon: Kayla Part, MD;  Location: [facility] OR;  Service: Vascular;  Laterality: Left;   UPPER EXTREMITY VENOGRAPHY Bilateral 06/14/2021   Procedure: UPPER EXTREMITY VENOGRAPHY;  Surgeon: Adine Hoof, MD;  Location: [facility] INVASIVE CV LAB;  Service: Cardiovascular;  Laterality: Bilateral;   UPPER EXTREMITY VENOGRAPHY N/A 04/24/2023   Procedure: UPPER EXTREMITY VENOGRAPHY;  Surgeon: Adine Hoof, MD;  Location: [facility] INVASIVE CV LAB;  Service: Cardiovascular;  Laterality: N/A;   VISCERAL ANGIOGRAPHY Right 06/17/2020   Procedure: CENTRAL VENO;  Surgeon: Carlene Che, MD;  Location: [facility] INVASIVE CV LAB;  Service: Cardiovascular;  Laterality: Right;   WOUND DEBRIDEMENT Right 08/17/2023   Procedure: DEBRIDEMENT, WOUND;  Surgeon: Philipp Brawn, MD;  Location: [facility] OR;  Service: Vascular;  Laterality: Right;   Social History   Occupational History   Not on file  Tobacco Use   Smoking status: Never   Smokeless tobacco: Never  Vaping Use   Vaping status: Never Used  Substance and Sexual Activity   Alcohol use: No   Drug use: Yes    Frequency: 2.0 times per week    Types: Marijuana    Comment: 1-2 times day   Sexual activity: Yes

## 2023-09-13 NOTE — Progress Notes (Signed)
 POST OPERATIVE OFFICE NOTE    CC:  F/u for surgery  HPI:  This is a 33 y.o. male who is s/p RUA AVG on 08/01/2023 by Dr. Vikki Graves.   He was taken back to the OR on 08/17/2023 and underwent I&D of right arm abscess, excision of AVG and vac placement by Dr. Susi Eric.   Cultures were taken and ID recommended 3 weeks of Levaquin  q48h and Minocycline  200mg  bid x 3 weeks.   Pt states he does not have pain/numbness in the right hand.  He states he is doing wet to dry dressings and is healing nicely.   The pt is on dialysis T/T/S at [facility] [address] location.   Allergies  Allergen Reactions   Hydralazine  Hcl Other (See Comments)    DRUG-INDUCED LUPUS    Current Outpatient Medications  Medication Sig Dispense Refill   aspirin  EC 81 MG tablet Take 1 tablet (81 mg total) by mouth daily. Swallow whole. 90 tablet 0   ELIQUIS  5 MG TABS tablet Take 1 tablet by mouth twice daily 60 tablet 0   ferric citrate  (AURYXIA ) 1 GM 210 MG(Fe) tablet Take 2-4 tablets (420-840 mg total) by mouth See admin instructions. Take 4 tablets (840 mg) by mouth with each meal & take 2 tablets (420 mg) by mouth with each snack. 270 tablet 0   gabapentin  (NEURONTIN ) 100 MG capsule TAKE 1 CAPSULE BY MOUTH THREE TIMES DAILY 90 capsule 0   gabapentin  (NEURONTIN ) 300 MG capsule Take 1 capsule by mouth at bedtime 30 capsule 0   LANTUS  100 UNIT/ML injection INJECT 5 UNITS SUBCUTANEOUSLY TWICE DAILY 10 mL 0   levofloxacin  (LEVAQUIN ) 500 MG tablet Take 1.5 tablets (750 mg total) by mouth every other day AND 1 tablet (500 mg total) every other day. 10 tablet 0   levothyroxine  (SYNTHROID ) 150 MCG tablet TAKE 1 TABLET BY MOUTH ONCE DAILY BEFORE BREAKFAST . APPOINTMENT REQUIRED FOR FUTURE REFILLS (Patient taking differently: Take 150 mcg by mouth daily before breakfast.) 15 tablet 0   midodrine  (PROAMATINE ) 5 MG tablet Take 1 tablet (5 mg total) by mouth Every Tuesday,Thursday,and Saturday with dialysis. 30 tablet 3   minocycline  (MINOCIN ) 100  MG capsule Take 2 capsules (200 mg total) by mouth 2 (two) times daily. 63 capsule 0   multivitamin (RENA-VIT) TABS tablet Take 1 tablet by mouth at bedtime. 30 tablet 0   oxyCODONE -acetaminophen  (PERCOCET/ROXICET) 5-325 MG tablet Take 1 tablet by mouth every 8 (eight) hours as needed for severe pain (pain score 7-10). 20 tablet 0   rosuvastatin  (CRESTOR ) 20 MG tablet Take 1 tablet by mouth once daily 90 tablet 0   zinc  sulfate 220 (50 Zn) MG capsule Take 1 capsule (220 mg total) by mouth daily. 14 capsule 0   No current facility-administered medications for this visit.     ROS:  See HPI  Physical Exam:  Today's Vitals   09/13/23 0927  BP: (!) 136/94  Pulse: 93  Temp: 98.1 F (36.7 C)  TempSrc: Temporal  SpO2: 94%  Weight: (!) 360 lb 6.4 oz (163.5 kg)  Height: 6\' 2"  (1.88 m)   Body mass index is 46.27 kg/m.   Incision:  axillary incision and lateral arm incision all healed nicely with staples and nylon Good granulation tissue distal incision  Extremities:   There is a palpable right radial pulse.   Motor and sensory are in tact.       Assessment/Plan:  This is a 33 y.o. male who is s/p: RUA  AVG on 08/01/2023 by Dr. Vikki Graves.   He was taken back to the OR on 08/17/2023 and underwent I&D of right arm abscess, excision of AVG and vac placement by Dr. Susi Eric.   Cultures were taken and ID recommended 3 weeks of Levaquin  q48h and Minocycline  200mg  bid x 3 weeks.   -the pt is doing well.  His distal incision looks great.  He will continue wet to dry dressing changes daily. -staples and nylon sutures removed and incisions look great -will have him return in 4-6 weeks and see Dr. Susi Eric for further discussions about a Hero graft.     Maryanna Smart, [facility] [phone]  Clinic MD:  Vikki Graves

## 2023-09-15 ENCOUNTER — Other Ambulatory Visit: Payer: Self-pay | Admitting: Student

## 2023-09-15 DIAGNOSIS — Z7901 Long term (current) use of anticoagulants: Secondary | ICD-10-CM | POA: Diagnosis not present

## 2023-09-15 DIAGNOSIS — Z6841 Body Mass Index (BMI) 40.0 and over, adult: Secondary | ICD-10-CM | POA: Diagnosis not present

## 2023-09-15 DIAGNOSIS — Z992 Dependence on renal dialysis: Secondary | ICD-10-CM | POA: Diagnosis not present

## 2023-09-15 DIAGNOSIS — I120 Hypertensive chronic kidney disease with stage 5 chronic kidney disease or end stage renal disease: Secondary | ICD-10-CM | POA: Diagnosis not present

## 2023-09-15 DIAGNOSIS — Z7982 Long term (current) use of aspirin: Secondary | ICD-10-CM | POA: Diagnosis not present

## 2023-09-15 DIAGNOSIS — T827XXA Infection and inflammatory reaction due to other cardiac and vascular devices, implants and grafts, initial encounter: Secondary | ICD-10-CM | POA: Diagnosis not present

## 2023-09-15 DIAGNOSIS — D509 Iron deficiency anemia, unspecified: Secondary | ICD-10-CM | POA: Diagnosis not present

## 2023-09-15 DIAGNOSIS — E785 Hyperlipidemia, unspecified: Secondary | ICD-10-CM | POA: Diagnosis not present

## 2023-09-15 DIAGNOSIS — Z792 Long term (current) use of antibiotics: Secondary | ICD-10-CM | POA: Diagnosis not present

## 2023-09-15 DIAGNOSIS — N2581 Secondary hyperparathyroidism of renal origin: Secondary | ICD-10-CM | POA: Diagnosis not present

## 2023-09-15 DIAGNOSIS — E1122 Type 2 diabetes mellitus with diabetic chronic kidney disease: Secondary | ICD-10-CM | POA: Diagnosis not present

## 2023-09-15 DIAGNOSIS — Z794 Long term (current) use of insulin: Secondary | ICD-10-CM | POA: Diagnosis not present

## 2023-09-15 DIAGNOSIS — K219 Gastro-esophageal reflux disease without esophagitis: Secondary | ICD-10-CM | POA: Diagnosis not present

## 2023-09-15 DIAGNOSIS — L02413 Cutaneous abscess of right upper limb: Secondary | ICD-10-CM | POA: Diagnosis not present

## 2023-09-15 DIAGNOSIS — D631 Anemia in chronic kidney disease: Secondary | ICD-10-CM | POA: Diagnosis not present

## 2023-09-15 DIAGNOSIS — N186 End stage renal disease: Secondary | ICD-10-CM | POA: Diagnosis not present

## 2023-09-18 ENCOUNTER — Telehealth: Payer: Self-pay

## 2023-09-18 DIAGNOSIS — N2581 Secondary hyperparathyroidism of renal origin: Secondary | ICD-10-CM | POA: Diagnosis not present

## 2023-09-18 DIAGNOSIS — Z992 Dependence on renal dialysis: Secondary | ICD-10-CM | POA: Diagnosis not present

## 2023-09-18 DIAGNOSIS — Z792 Long term (current) use of antibiotics: Secondary | ICD-10-CM | POA: Diagnosis not present

## 2023-09-18 DIAGNOSIS — D631 Anemia in chronic kidney disease: Secondary | ICD-10-CM | POA: Diagnosis not present

## 2023-09-18 DIAGNOSIS — I120 Hypertensive chronic kidney disease with stage 5 chronic kidney disease or end stage renal disease: Secondary | ICD-10-CM | POA: Diagnosis not present

## 2023-09-18 DIAGNOSIS — Z7901 Long term (current) use of anticoagulants: Secondary | ICD-10-CM | POA: Diagnosis not present

## 2023-09-18 DIAGNOSIS — L02413 Cutaneous abscess of right upper limb: Secondary | ICD-10-CM | POA: Diagnosis not present

## 2023-09-18 DIAGNOSIS — Z794 Long term (current) use of insulin: Secondary | ICD-10-CM | POA: Diagnosis not present

## 2023-09-18 DIAGNOSIS — E1122 Type 2 diabetes mellitus with diabetic chronic kidney disease: Secondary | ICD-10-CM | POA: Diagnosis not present

## 2023-09-18 DIAGNOSIS — K219 Gastro-esophageal reflux disease without esophagitis: Secondary | ICD-10-CM | POA: Diagnosis not present

## 2023-09-18 DIAGNOSIS — Z7982 Long term (current) use of aspirin: Secondary | ICD-10-CM | POA: Diagnosis not present

## 2023-09-18 DIAGNOSIS — D509 Iron deficiency anemia, unspecified: Secondary | ICD-10-CM | POA: Diagnosis not present

## 2023-09-18 DIAGNOSIS — Z6841 Body Mass Index (BMI) 40.0 and over, adult: Secondary | ICD-10-CM | POA: Diagnosis not present

## 2023-09-18 DIAGNOSIS — E785 Hyperlipidemia, unspecified: Secondary | ICD-10-CM | POA: Diagnosis not present

## 2023-09-18 DIAGNOSIS — T827XXA Infection and inflammatory reaction due to other cardiac and vascular devices, implants and grafts, initial encounter: Secondary | ICD-10-CM | POA: Diagnosis not present

## 2023-09-18 DIAGNOSIS — N186 End stage renal disease: Secondary | ICD-10-CM | POA: Diagnosis not present

## 2023-09-18 NOTE — Telephone Encounter (Signed)
 Home Health: Rice Chamorro, RN @ [facility] called requesting verbal orders to update home health to RN x 1 visit next week and will likely be discontinued after that.   -returned call and VM full

## 2023-09-19 NOTE — Telephone Encounter (Signed)
 Paul Chamorro, RN @ [facility] was notified that her request was updated by Kristi Petties, RN (See Telephone note)  Paul [name] stated that the pt will be discharged from [facility]'s care on Sep 25, 2023.

## 2023-09-20 DIAGNOSIS — N186 End stage renal disease: Secondary | ICD-10-CM | POA: Diagnosis not present

## 2023-09-20 DIAGNOSIS — Z992 Dependence on renal dialysis: Secondary | ICD-10-CM | POA: Diagnosis not present

## 2023-09-20 DIAGNOSIS — E1129 Type 2 diabetes mellitus with other diabetic kidney complication: Secondary | ICD-10-CM | POA: Diagnosis not present

## 2023-09-25 DIAGNOSIS — Z992 Dependence on renal dialysis: Secondary | ICD-10-CM | POA: Diagnosis not present

## 2023-09-25 DIAGNOSIS — Z792 Long term (current) use of antibiotics: Secondary | ICD-10-CM | POA: Diagnosis not present

## 2023-09-25 DIAGNOSIS — N186 End stage renal disease: Secondary | ICD-10-CM | POA: Diagnosis not present

## 2023-09-25 DIAGNOSIS — Z7901 Long term (current) use of anticoagulants: Secondary | ICD-10-CM | POA: Diagnosis not present

## 2023-09-25 DIAGNOSIS — E1122 Type 2 diabetes mellitus with diabetic chronic kidney disease: Secondary | ICD-10-CM | POA: Diagnosis not present

## 2023-09-25 DIAGNOSIS — T827XXA Infection and inflammatory reaction due to other cardiac and vascular devices, implants and grafts, initial encounter: Secondary | ICD-10-CM | POA: Diagnosis not present

## 2023-09-25 DIAGNOSIS — Z6841 Body Mass Index (BMI) 40.0 and over, adult: Secondary | ICD-10-CM | POA: Diagnosis not present

## 2023-09-25 DIAGNOSIS — K219 Gastro-esophageal reflux disease without esophagitis: Secondary | ICD-10-CM | POA: Diagnosis not present

## 2023-09-25 DIAGNOSIS — Z794 Long term (current) use of insulin: Secondary | ICD-10-CM | POA: Diagnosis not present

## 2023-09-25 DIAGNOSIS — L02413 Cutaneous abscess of right upper limb: Secondary | ICD-10-CM | POA: Diagnosis not present

## 2023-09-25 DIAGNOSIS — D509 Iron deficiency anemia, unspecified: Secondary | ICD-10-CM | POA: Diagnosis not present

## 2023-09-25 DIAGNOSIS — I120 Hypertensive chronic kidney disease with stage 5 chronic kidney disease or end stage renal disease: Secondary | ICD-10-CM | POA: Diagnosis not present

## 2023-09-25 DIAGNOSIS — D631 Anemia in chronic kidney disease: Secondary | ICD-10-CM | POA: Diagnosis not present

## 2023-09-25 DIAGNOSIS — N2581 Secondary hyperparathyroidism of renal origin: Secondary | ICD-10-CM | POA: Diagnosis not present

## 2023-09-25 DIAGNOSIS — E785 Hyperlipidemia, unspecified: Secondary | ICD-10-CM | POA: Diagnosis not present

## 2023-09-25 DIAGNOSIS — Z7982 Long term (current) use of aspirin: Secondary | ICD-10-CM | POA: Diagnosis not present

## 2023-09-27 ENCOUNTER — Other Ambulatory Visit: Payer: Self-pay | Admitting: Student

## 2023-09-27 DIAGNOSIS — E039 Hypothyroidism, unspecified: Secondary | ICD-10-CM

## 2023-10-05 ENCOUNTER — Other Ambulatory Visit: Payer: Self-pay | Admitting: Student

## 2023-10-05 DIAGNOSIS — E785 Hyperlipidemia, unspecified: Secondary | ICD-10-CM

## 2023-10-11 ENCOUNTER — Ambulatory Visit (INDEPENDENT_AMBULATORY_CARE_PROVIDER_SITE_OTHER): Admitting: Family

## 2023-10-11 ENCOUNTER — Encounter: Payer: Self-pay | Admitting: Family

## 2023-10-11 DIAGNOSIS — Z89432 Acquired absence of left foot: Secondary | ICD-10-CM

## 2023-10-11 DIAGNOSIS — L97521 Non-pressure chronic ulcer of other part of left foot limited to breakdown of skin: Secondary | ICD-10-CM

## 2023-10-11 NOTE — Progress Notes (Signed)
 Office Visit Note   Patient: Paul [name]           Date of Birth: 04-15-1991           MRN: [id number] Visit Date: 10/11/2023              Requested by: Vallorie Gayer, DO [address] 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  Plan: The incision is fully healed he will follow-up in the office for debridement of hyperkeratotic tissue  Follow-Up Instructions: No follow-ups on file.   Ortho Exam  Patient is alert, oriented, no adenopathy, well-dressed, normal affect, normal respiratory effort. On examination left foot the first ray amputation has buildup of hyperkeratotic tissue which was debrided today with a 10 blade knife back to viable tissue there is no underlying open area this has fully epithelialized  Imaging: No results found. No images are attached to the encounter.  Labs: Lab Results  Component Value Date   HGBA1C 7.4 (H) 08/17/2023   HGBA1C 7.4 (H) 09/30/2022   HGBA1C 6.3 (H) 04/18/2022   REPTSTATUS 08/22/2023 FINAL 08/17/2023   GRAMSTAIN NO WBC SEEN FEW GRAM POSITIVE COCCI IN PAIRS  08/17/2023   CULT  08/17/2023    MODERATE STAPHYLOCOCCUS AUREUS FEW SERRATIA MARCESCENS NO ANAEROBES ISOLATED Performed at [facility], [address], [place], [place] [address]    [facility] STAPHYLOCOCCUS AUREUS 08/17/2023   LABORGA SERRATIA MARCESCENS 08/17/2023     Lab Results  Component Value Date   ALBUMIN  4.0 08/17/2023   ALBUMIN  3.3 (L) 09/30/2022   ALBUMIN  4.4 09/29/2022    Lab Results  Component Value Date   MG 2.8 (H) 09/26/2016   MG 2.8 (H) 09/25/2016   MG 2.8 (H) 09/24/2016   Lab Results  Component Value Date    VD25OH 6.8 (L) 09/14/2016    No results found for: "PREALBUMIN"    Latest Ref Rng & Units 08/17/2023    8:27 PM 08/17/2023   12:25 PM 08/01/2023   11:50 AM  CBC EXTENDED  WBC 4.0 - 10.5 K/uL 16.7     RBC 4.22 - 5.81 MIL/uL 3.98     Hemoglobin 13.0 - 17.0 g/dL 82.9  56.2  13.0   HCT 39.0 - 52.0 % 33.2  42.0  44.0   Platelets 150 - 400 K/uL 173        There is no height or weight on file to calculate BMI.  Orders:  No orders of the defined types were placed in this encounter.  No orders of the defined types were placed in this encounter.    Procedures: No procedures performed  Clinical Data: No additional findings.  ROS:  All other systems negative, except as noted in the HPI. Review of Systems  Objective: Vital Signs: There were no vitals taken for this visit.  Specialty Comments:  No specialty comments available.  PMFS History: Patient Active Problem List   Diagnosis Date Noted   Infection of vascular bypass graft, initial encounter (HCC) 08/17/2023   Acute osteomyelitis of left foot (HCC) 09/30/2022   Sepsis (HCC) 09/29/2022   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  Thrombus 05/04/2022   Ischemia of left upper extremity 05/04/2022   Complication of vascular access for dialysis 04/19/2022   ESRD (end stage renal disease) (HCC) 04/19/2022   Failure of hemodialysis access (HCC) 04/18/2022   Left hand pain 09/22/2021   Diabetic foot infection (HCC)    Wound infection 05/07/2021   MSSA bacteremia 05/07/2021   Osteomyelitis (HCC) 05/07/2021   Abscess 05/07/2021   Sepsis due to cellulitis (HCC) 05/06/2021   Poor compliance with medication 03/17/2021   Fluid overload, unspecified 07/09/2020   Complication of vascular dialysis catheter 03/23/2020   Other disorders of phosphorus metabolism 01/28/2020   Hypertension 01/08/2020   Hypocalcemia 10/29/2019   ESRD on dialysis (HCC) 11/16/2017   Pruritus, unspecified 04/04/2017   Unspecified  protein-calorie malnutrition (HCC) 11/11/2016   Headache, unspecified 10/29/2016   Encounter for immunization 10/19/2016   Iron  deficiency anemia, unspecified 10/10/2016   Anemia in chronic kidney disease 09/30/2016   Coagulation defect, unspecified (HCC) 09/30/2016   Pneumonia due to Pseudomonas (HCC) 09/30/2016   Secondary hyperparathyroidism of renal origin (HCC) 09/30/2016   Hypothyroidism 10/29/2009   Type 2 diabetes mellitus (HCC) 04/14/2009   Hyperlipidemia 04/14/2009   OBESITY, MORBID 04/14/2009   Essential hypertension, benign 04/14/2009   Past Medical History:  Diagnosis Date   Acute hypoxemic respiratory failure (HCC) 09/11/2016   Anemia    Chronic kidney disease    ARF on CRF Dialysis T/TH/Sa   Complication of anesthesia    per patient blood pressure drops   Diabetes mellitus    Type II   End stage renal disease on dialysis (HCC)    [place]    GERD (gastroesophageal reflux disease)    diet controlled   HLD (hyperlipidemia)    Hypertension    Hypothyroidism    Morbid obesity (HCC)    Sacral wound    resolved   Thyroid  disease     Family History  Problem Relation Age of Onset   Heart disease Mother    Hypertension Mother     Past Surgical History:  Procedure Laterality Date   AMPUTATION Left 05/08/2021   Procedure: AMPUTATION GREAT TOE;  Surgeon: Timothy Ford, MD;  Location: [facility] OR;  Service: Orthopedics;  Laterality: Left;   AMPUTATION Left 10/01/2022   Procedure: LEFT FOOT AMPUTATION RAY 1st;  Surgeon: Timothy Ford, MD;  Location: [facility] OR;  Service: Orthopedics;  Laterality: Left;   APPLICATION OF WOUND VAC Right 08/17/2023   Procedure: APPLICATION, WOUND VAC;  Surgeon: Philipp Brawn, MD;  Location: [facility] OR;  Service: Vascular;  Laterality: Right;   AV FISTULA PLACEMENT Left 09/26/2016   Procedure: LEFT UPPER ARM ARTERIOVENOUS (AV) FISTULA CREATION;  Surgeon: Richrd Char, MD;  Location: [facility] OR;  Service: Vascular;  Laterality: Left;   AV  FISTULA PLACEMENT Left 12/02/2016   Procedure: INSERTION OF ARTERIOVENOUS GORE-TEX GRAFT LEFT UPPER  ARM USING A 4-7MM BY 45CM GRAFT ;  Surgeon: Mayo Speck, MD;  Location: [facility] OR;  Service: Vascular;  Laterality: Left;   AV FISTULA PLACEMENT Right 07/03/2020   Procedure: INSERTION OF ARTERIOVENOUS (AV) GORE-TEX GRAFT RIGHT UPPER ARM;  Surgeon: Young Hensen, MD;  Location: [facility] OR;  Service: Vascular;  Laterality: Right;   AV FISTULA PLACEMENT Left 07/02/2021   Procedure: LEFT UPPER EXTREMITY ARTERIOVENOUS FISTULA CREATION;  Surgeon: Adine Hoof, MD;  Location: [facility] OR;  Service: Vascular;  Laterality: Left;   AV FISTULA PLACEMENT Right 05/12/2023   Procedure: RIGHT UPPER EXTREMITY ARTERIOVENOUS (AV) FISTULA  CREATION;  Surgeon: Adine Hoof, MD;  Location: [facility] OR;  Service: Vascular;  Laterality: Right;   AVGG REMOVAL Right 08/17/2023   Procedure: REMOVAL OF ARTERIOVENOUS GORETEX GRAFT (AVGG);  Surgeon: Philipp Brawn, MD;  Location: [facility] OR;  Service: Vascular;  Laterality: Right;   BASCILIC VEIN TRANSPOSITION Right 05/06/2020   Procedure: FIRST STAGE RIGHT BASCILIC VEIN TRANSPOSITION;  Surgeon: Margherita Shell, MD;  Location: [facility] OR;  Service: Vascular;  Laterality: Right;   BASCILIC VEIN TRANSPOSITION Left 08/20/2021   Procedure: LEFT ARM SECOND STAGE WITH ARTERIOVENOUS GRAFT;  Surgeon: Adine Hoof, MD;  Location: [facility] OR;  Service: Vascular;  Laterality: Left;   BASCILIC VEIN TRANSPOSITION Right 08/01/2023   Procedure: RIGHT UPPER ARM FISTULA LIGATION;  Surgeon: Adine Hoof, MD;  Location: [facility] OR;  Service: Vascular;  Laterality: Right;   DIALYSIS/PERMA CATHETER INSERTION N/A 06/21/2023   Procedure: DIALYSIS/PERMA CATHETER INSERTION;  Surgeon: Patrick Boor, MD;  Location: [facility] INVASIVE CV LAB;  Service: Cardiovascular;  Laterality: N/A;   DIALYSIS/PERMA CATHETER REMOVAL N/A 06/21/2023   Procedure: DIALYSIS/PERMA CATHETER REMOVAL;  Surgeon: Patrick Boor, MD;  Location: [facility] INVASIVE CV LAB;  Service: Cardiovascular;  Laterality: N/A;   EXCHANGE OF A DIALYSIS CATHETER Right 10/08/2016   Procedure: EXCHANGE OF A DIALYSIS CATHETER-RIGHT INTERNAL JUGULAR;  Surgeon: Arvil Lauber, MD;  Location: [facility] OR;  Service: Vascular;  Laterality: Right;   INSERTION OF ARTERIOVENOUS (AV) ARTEGRAFT ARM Right 08/01/2023   Procedure: INSERTION, GRAFT, ARTERIOVENOUS, RIGHT UPPER EXTREMITY USING GORE TEX;  Surgeon: Adine Hoof, MD;  Location: [facility] OR;  Service: Vascular;  Laterality: Right;   INSERTION OF DIALYSIS CATHETER Right 09/26/2016   Procedure: INSERTION OF DIALYSIS CATHETER - Right Internal Jugular Placement;  Surgeon: Richrd Char, MD;  Location: [facility] OR;  Service: Vascular;  Laterality: Right;   INSERTION OF DIALYSIS CATHETER Left 03/22/2018   Procedure: INSERTION OF DIALYSIS CATHETER;  Surgeon: Young Hensen, MD;  Location: [facility] OR;  Service: Vascular;  Laterality: Left;   INTRAOPERATIVE ARTERIOGRAM Left 05/04/2022   Procedure: INTRA OPERATIVE ARTERIOGRAM;  Surgeon: Kayla Part, MD;  Location: [facility] OR;  Service: Vascular;  Laterality: Left;   IR AV DIALY SHUNT INTRO NEEDLE/INTRAC INITIAL W/PTA/STENT/IMG LT Left 02/17/2023   IR AV DIALY SHUNT INTRO NEEDLE/INTRACATH INITIAL W/PTA/IMG LEFT  08/19/2022   IR AV DIALY SHUNT INTRO NEEDLE/INTRACATH INITIAL W/PTA/IMG LEFT  11/14/2022   IR DIALY SHUNT INTRO NEEDLE/INTRACATH INITIAL W/IMG LEFT Left 04/15/2022   IR FLUORO GUIDE CV LINE LEFT  04/19/2022   IR FLUORO GUIDE CV LINE LEFT  05/30/2022   IR FLUORO GUIDE CV LINE LEFT  03/24/2023   IR FLUORO GUIDE CV LINE RIGHT  03/26/2021   IR FLUORO GUIDE CV LINE RIGHT  05/13/2021   IR FLUORO GUIDE CV LINE RIGHT  05/04/2022   IR FLUORO GUIDE CV LINE RIGHT  02/17/2023   IR REMOVAL TUN CV CATH W/O FL  05/11/2021   IR REMOVAL TUN CV CATH W/O FL  10/06/2021   IR THROMBECTOMY AV FISTULA W/THROMBOLYSIS/PTA INC/SHUNT/IMG LEFT Left 02/02/2022   IR THROMBECTOMY AV  FISTULA W/THROMBOLYSIS/PTA INC/SHUNT/IMG LEFT Left 03/22/2022   IR THROMBECTOMY AV FISTULA W/THROMBOLYSIS/PTA INC/SHUNT/IMG LEFT Left 05/04/2022   IR THROMBECTOMY AV FISTULA W/THROMBOLYSIS/PTA INC/SHUNT/IMG LEFT Left 12/09/2022   IR THROMBECTOMY AV FISTULA W/THROMBOLYSIS/PTA/STENT INC/SHUNT/IMG LT Left 04/27/2022   IR US  GUIDE VASC ACCESS LEFT  02/02/2022   IR US  GUIDE VASC ACCESS LEFT  03/22/2022   IR US  GUIDE VASC  ACCESS LEFT  04/15/2022   IR US  GUIDE VASC ACCESS LEFT  04/19/2022   IR US  GUIDE VASC ACCESS LEFT  04/27/2022   IR US  GUIDE VASC ACCESS LEFT  12/13/2022   IR US  GUIDE VASC ACCESS LEFT  02/21/2023   IR US  GUIDE VASC ACCESS RIGHT  03/26/2021   IR US  GUIDE VASC ACCESS RIGHT  05/13/2021   IR US  GUIDE VASC ACCESS RIGHT  05/04/2022   IR US  GUIDE VASC ACCESS RIGHT  02/17/2023   PERIPHERAL VASCULAR BALLOON ANGIOPLASTY  06/21/2023   Procedure: PERIPHERAL VASCULAR BALLOON ANGIOPLASTY;  Surgeon: Patrick Boor, MD;  Location: [facility] INVAS[facility] CV LAB;  Service: Cardiovascular;;  Innominate Vein   REVISON OF ARTERIOVENOUS FISTULA Left 03/22/2018   Procedure: LEFT UPPER EXTREMITY ARTERIOVENOUS  REVISION WITH GORE-TEX GRAFT.;  Surgeon: Young Hensen, MD;  Location: [facility] OR;  Service: Vascular;  Laterality: Left;   TEE WITHOUT CARDIOVERSION N/A 05/12/2021   Procedure: TRANSESOPHAGEAL ECHOCARDIOGRAM (TEE);  Surgeon: Jann Melody, MD;  Location: [facility] ENDOSCOPY;  Service: Cardiovascular;  Laterality: N/A;   THROMBECTOMY Left 08/26/2019   thrombectomy of LUA loop AVG   THROMBECTOMY AND REVISION OF ARTERIOVENTOUS (AV) GORETEX  GRAFT Right 07/27/2020   Procedure: INSERTION OF RIGHT ARM LOOP GRAFT WITH EXCISION OF RIGHT ARM BRACHIAL AXILLARY GRAFT;  Surgeon: Young Hensen, MD;  Location: [facility] OR;  Service: Vascular;  Laterality: Right;   THROMBECTOMY BRACHIAL ARTERY Left 05/04/2022   Procedure: LEFT ARM RADIAL BRACHIAL ARTERY EMBOLECTOMY;  Surgeon: Kayla Part, MD;  Location: [facility] OR;  Service:  Vascular;  Laterality: Left;   UPPER EXTREMITY VENOGRAPHY Bilateral 06/14/2021   Procedure: UPPER EXTREMITY VENOGRAPHY;  Surgeon: Adine Hoof, MD;  Location: [facility] INVASIVE CV LAB;  Service: Cardiovascular;  Laterality: Bilateral;   UPPER EXTREMITY VENOGRAPHY N/A 04/24/2023   Procedure: UPPER EXTREMITY VENOGRAPHY;  Surgeon: Adine Hoof, MD;  Location: [facility] INVASIVE CV LAB;  Service: Cardiovascular;  Laterality: N/A;   VISCERAL ANGIOGRAPHY Right 06/17/2020   Procedure: CENTRAL VENO;  Surgeon: Carlene Che, MD;  Location: [facility] INVASIVE CV LAB;  Service: Cardiovascular;  Laterality: Right;   WOUND DEBRIDEMENT Right 08/17/2023   Procedure: DEBRIDEMENT, WOUND;  Surgeon: Philipp Brawn, MD;  Location: [facility] OR;  Service: Vascular;  Laterality: Right;   Social History   Occupational History   Not on file  Tobacco Use   Smoking status: Never   Smokeless tobacco: Never  Vaping Use   Vaping status: Never Used  Substance and Sexual Activity   Alcohol use: No   Drug use: Yes    Frequency: 2.0 times per week    Types: Marijuana    Comment: 1-2 times day   Sexual activity: Yes

## 2023-10-15 ENCOUNTER — Other Ambulatory Visit: Payer: Self-pay | Admitting: Student

## 2023-10-18 NOTE — Progress Notes (Unsigned)
 Patient ID: Amilcar Reever, male   DOB: January 15, 1991, 33 y.o.   MRN: [id number]  Reason for Consult: No chief complaint on file.   Referred by Vallorie Gayer, DO  Subjective:  HPI Raylan Hanton is a 33 y.o. male who presents for evaluation HD access creation.  Past Medical History: 09/11/2016: Acute hypoxemic respiratory failure (HCC) No date: Anemia No date: Chronic kidney disease     Comment:  ARF on CRF Dialysis T/TH/Sa No date: Complication of anesthesia     Comment:  per patient blood pressure drops No date: Diabetes mellitus     Comment:  Type II No date: End stage renal disease on dialysis [facility])     Comment:  [place]  No date: GERD (gastroesophageal reflux disease)     Comment:  diet controlled No date: HLD (hyperlipidemia) No date: Hypertension No date: Hypothyroidism No date: Morbid obesity (HCC) No date: Sacral wound     Comment:  resolved No date: Thyroid  disease  Family History  Problem Relation Age of Onset   Heart disease Mother    Hypertension Mother    Past Surgical History:  Procedure Laterality Date   AMPUTATION Left 05/08/2021   Procedure: AMPUTATION GREAT TOE;  Surgeon: Timothy Ford, MD;  Location: [facility] OR;  Service: Orthopedics;  Laterality: Left;   AMPUTATION Left 10/01/2022   Procedure: LEFT FOOT AMPUTATION RAY 1st;  Surgeon: Timothy Ford, MD;  Location: [facility] OR;  Service: Orthopedics;  Laterality: Left;   APPLICATION OF WOUND VAC Right 08/17/2023   Procedure: APPLICATION, WOUND VAC;  Surgeon: Philipp Brawn, MD;  Location: [facility] OR;  Service: Vascular;  Laterality: Right;   AV FISTULA PLACEMENT Left 09/26/2016   Procedure: LEFT UPPER ARM ARTERIOVENOUS (AV) FISTULA CREATION;  Surgeon: Richrd Char, MD;  Location: [facility] OR;  Service: Vascular;  Laterality: Left;   AV FISTULA PLACEMENT Left 12/02/2016   Procedure: INSERTION OF ARTERIOVENOUS GORE-TEX GRAFT LEFT UPPER  ARM USING A 4-7MM BY 45CM GRAFT ;  Surgeon: Mayo Speck, MD;  Location: [facility] OR;   Service: Vascular;  Laterality: Left;   AV FISTULA PLACEMENT Right 07/03/2020   Procedure: INSERTION OF ARTERIOVENOUS (AV) GORE-TEX GRAFT RIGHT UPPER ARM;  Surgeon: Young Hensen, MD;  Location: [facility] OR;  Service: Vascular;  Laterality: Right;   AV FISTULA PLACEMENT Left 07/02/2021   Procedure: LEFT UPPER EXTREMITY ARTERIOVENOUS FISTULA CREATION;  Surgeon: Adine Hoof, MD;  Location: [facility] OR;  Service: Vascular;  Laterality: Left;   AV FISTULA PLACEMENT Right 05/12/2023   Procedure: RIGHT UPPER EXTREMITY ARTERIOVENOUS (AV) FISTULA CREATION;  Surgeon: Adine Hoof, MD;  Location: [facility] OR;  Service: Vascular;  Laterality: Right;   AVGG REMOVAL Right 08/17/2023   Procedure: REMOVAL OF ARTERIOVENOUS GORETEX GRAFT (AVGG);  Surgeon: Philipp Brawn, MD;  Location: [facility] OR;  Service: Vascular;  Laterality: Right;   BASCILIC VEIN TRANSPOSITION Right 05/06/2020   Procedure: FIRST STAGE RIGHT BASCILIC VEIN TRANSPOSITION;  Surgeon: Margherita Shell, MD;  Location: [facility] OR;  Service: Vascular;  Laterality: Right;   BASCILIC VEIN TRANSPOSITION Left 08/20/2021   Procedure: LEFT ARM SECOND STAGE WITH ARTERIOVENOUS GRAFT;  Surgeon: Adine Hoof, MD;  Location: [facility] OR;  Service: Vascular;  Laterality: Left;   BASCILIC VEIN TRANSPOSITION Right 08/01/2023   Procedure: RIGHT UPPER ARM FISTULA LIGATION;  Surgeon: Adine Hoof, MD;  Location: [facility] OR;  Service: Vascular;  Laterality: Right;   DIALYSIS/PERMA CATHETER INSERTION N/A 06/21/2023   Procedure: DIALYSIS/PERMA CATHETER INSERTION;  Surgeon: Patrick Boor, MD;  Location: [facility] INVASIVE CV LAB;  Service: Cardiovascular;  Laterality: N/A;   DIALYSIS/PERMA CATHETER REMOVAL N/A 06/21/2023   Procedure: DIALYSIS/PERMA CATHETER REMOVAL;  Surgeon: Patrick Boor, MD;  Location: [facility] INVASIVE CV LAB;  Service: Cardiovascular;  Laterality: N/A;   EXCHANGE OF A DIALYSIS CATHETER Right 10/08/2016   Procedure: EXCHANGE OF A DIALYSIS  CATHETER-RIGHT INTERNAL JUGULAR;  Surgeon: Arvil Lauber, MD;  Location: [facility] OR;  Service: Vascular;  Laterality: Right;   INSERTION OF ARTERIOVENOUS (AV) ARTEGRAFT ARM Right 08/01/2023   Procedure: INSERTION, GRAFT, ARTERIOVENOUS, RIGHT UPPER EXTREMITY USING GORE TEX;  Surgeon: Adine Hoof, MD;  Location: [facility] OR;  Service: Vascular;  Laterality: Right;   INSERTION OF DIALYSIS CATHETER Right 09/26/2016   Procedure: INSERTION OF DIALYSIS CATHETER - Right Internal Jugular Placement;  Surgeon: Richrd Char, MD;  Location: [facility] OR;  Service: Vascular;  Laterality: Right;   INSERTION OF DIALYSIS CATHETER Left 03/22/2018   Procedure: INSERTION OF DIALYSIS CATHETER;  Surgeon: Young Hensen, MD;  Location: [facility] OR;  Service: Vascular;  Laterality: Left;   INTRAOPERATIVE ARTERIOGRAM Left 05/04/2022   Procedure: INTRA OPERATIVE ARTERIOGRAM;  Surgeon: Kayla Part, MD;  Location: [facility] OR;  Service: Vascular;  Laterality: Left;   IR AV DIALY SHUNT INTRO NEEDLE/INTRAC INITIAL W/PTA/STENT/IMG LT Left 02/17/2023   IR AV DIALY SHUNT INTRO NEEDLE/INTRACATH INITIAL W/PTA/IMG LEFT  08/19/2022   IR AV DIALY SHUNT INTRO NEEDLE/INTRACATH INITIAL W/PTA/IMG LEFT  11/14/2022   IR DIALY SHUNT INTRO NEEDLE/INTRACATH INITIAL W/IMG LEFT Left 04/15/2022   IR FLUORO GUIDE CV LINE LEFT  04/19/2022   IR FLUORO GUIDE CV LINE LEFT  05/30/2022   IR FLUORO GUIDE CV LINE LEFT  03/24/2023   IR FLUORO GUIDE CV LINE RIGHT  03/26/2021   IR FLUORO GUIDE CV LINE RIGHT  05/13/2021   IR FLUORO GUIDE CV LINE RIGHT  05/04/2022   IR FLUORO GUIDE CV LINE RIGHT  02/17/2023   IR REMOVAL TUN CV CATH W/O FL  05/11/2021   IR REMOVAL TUN CV CATH W/O FL  10/06/2021   IR THROMBECTOMY AV FISTULA W/THROMBOLYSIS/PTA INC/SHUNT/IMG LEFT Left 02/02/2022   IR THROMBECTOMY AV FISTULA W/THROMBOLYSIS/PTA INC/SHUNT/IMG LEFT Left 03/22/2022   IR THROMBECTOMY AV FISTULA W/THROMBOLYSIS/PTA INC/SHUNT/IMG LEFT Left 05/04/2022   IR THROMBECTOMY AV FISTULA  W/THROMBOLYSIS/PTA INC/SHUNT/IMG LEFT Left 12/09/2022   IR THROMBECTOMY AV FISTULA W/THROMBOLYSIS/PTA/STENT INC/SHUNT/IMG LT Left 04/27/2022   IR US  GUIDE VASC ACCESS LEFT  02/02/2022   IR US  GUIDE VASC ACCESS LEFT  03/22/2022   IR US  GUIDE VASC ACCESS LEFT  04/15/2022   IR US  GUIDE VASC ACCESS LEFT  04/19/2022   IR US  GUIDE VASC ACCESS LEFT  04/27/2022   IR US  GUIDE VASC ACCESS LEFT  12/13/2022   IR US  GUIDE VASC ACCESS LEFT  02/21/2023   IR US  GUIDE VASC ACCESS RIGHT  03/26/2021   IR US  GUIDE VASC ACCESS RIGHT  05/13/2021   IR US  GUIDE VASC ACCESS RIGHT  05/04/2022   IR US  GUIDE VASC ACCESS RIGHT  02/17/2023   PERIPHERAL VASCULAR BALLOON ANGIOPLASTY  06/21/2023   Procedure: PERIPHERAL VASCULAR BALLOON ANGIOPLASTY;  Surgeon: Patrick Boor, MD;  Location: [facility] INVASIVE CV LAB;  Service: Cardiovascular;;  Innominate Vein   REVISON OF ARTERIOVENOUS FISTULA Left 03/22/2018   Procedure: LEFT UPPER EXTREMITY ARTERIOVENOUS  REVISION WITH GORE-TEX GRAFT.;  Surgeon: Young Hensen, MD;  Location: [facility] OR;  Service: Vascular;  Laterality: Left;   TEE WITHOUT CARDIOVERSION  N/A 05/12/2021   Procedure: TRANSESOPHAGEAL ECHOCARDIOGRAM (TEE);  Surgeon: Jann Melody, MD;  Location[facility] ENDOSCOPY;  Service: Cardiovascular;  Laterality: N/A;   THROMBECTOMY Left 08/26/2019   thrombectomy of LUA loop AVG   THROMBECTOMY AND REVISION OF ARTERIOVENTOUS (AV) GORETEX  GRAFT Right 07/27/2020   Procedure: INSERTION OF RIGHT ARM LOOP GRAFT WITH EXCISION OF RIGHT ARM BRACHIAL AXILLARY GRAFT;  Surgeon: Young Hensen, MD;  Location: [facility] OR;  Service: Vascular;  Laterality: Right;   THROMBECTOMY BRACHIAL ARTERY Left 05/04/2022   Procedure: LEFT ARM RADIAL BRACHIAL ARTERY EMBOLECTOMY;  Surgeon: Kayla Part, MD;  Location: [facility] OR;  Service: Vascular;  Laterality: Left;   UPPER EXTREMITY VENOGRAPHY Bilateral 06/14/2021   Procedure: UPPER EXTREMITY VENOGRAPHY;  Surgeon: Adine Hoof, MD;  Location: [facility]  INVASIVE CV LAB;  Service: Cardiovascular;  Laterality: Bilateral;   UPPER EXTREMITY VENOGRAPHY N/A 04/24/2023   Procedure: UPPER EXTREMITY VENOGRAPHY;  Surgeon: Adine Hoof, MD;  Location: [facility] INVASIVE CV LAB;  Service: Cardiovascular;  Laterality: N/A;   VISCERAL ANGIOGRAPHY Right 06/17/2020   Procedure: CENTRAL VENO;  Surgeon: Carlene Che, MD;  Location: [facility] INVASIVE CV LAB;  Service: Cardiovascular;  Laterality: Right;   WOUND DEBRIDEMENT Right 08/17/2023   Procedure: DEBRIDEMENT, WOUND;  Surgeon: Philipp Brawn, MD;  Location: [facility] OR;  Service: Vascular;  Laterality: Right;    Short Social History:  Social History   Tobacco Use   Smoking status: Never   Smokeless tobacco: Never  Substance Use Topics   Alcohol use: No    Allergies  Allergen Reactions   Hydralazine  Hcl Other (See Comments)    DRUG-INDUCED LUPUS    Current Outpatient Medications  Medication Sig Dispense Refill   aspirin  EC 81 MG tablet Take 1 tablet (81 mg total) by mouth daily. Swallow whole. 90 tablet 0   ELIQUIS  5 MG TABS tablet Take 1 tablet by mouth twice daily 60 tablet 0   ferric citrate  (AURYXIA ) 1 GM 210 MG(Fe) tablet Take 2-4 tablets (420-840 mg total) by mouth See admin instructions. Take 4 tablets (840 mg) by mouth with each meal & take 2 tablets (420 mg) by mouth with each snack. 270 tablet 0   gabapentin  (NEURONTIN ) 100 MG capsule TAKE 1 CAPSULE BY MOUTH THREE TIMES DAILY 90 capsule 0   gabapentin  (NEURONTIN ) 300 MG capsule Take 1 capsule by mouth at bedtime 30 capsule 0   LANTUS  100 UNIT/ML injection INJECT 5 UNITS SUBCUTANEOUSLY TWICE DAILY 10 mL 0   levofloxacin  (LEVAQUIN ) 500 MG tablet Take 1.5 tablets (750 mg total) by mouth every other day AND 1 tablet (500 mg total) every other day. 10 tablet 0   levothyroxine  (SYNTHROID ) 150 MCG tablet TAKE 1 TABLET BY MOUTH ONCE DAILY BEFORE BREAKFAST * APPOINTMENT REQUIRED FOR FUTURE REFILLS * 15 tablet 0   midodrine  (PROAMATINE ) 5 MG tablet  Take 1 tablet (5 mg total) by mouth Every Tuesday,Thursday,and Saturday with dialysis. 30 tablet 3   minocycline  (MINOCIN ) 100 MG capsule Take 2 capsules (200 mg total) by mouth 2 (two) times daily. 63 capsule 0   multivitamin (RENA-VIT) TABS tablet Take 1 tablet by mouth at bedtime. 30 tablet 0   rosuvastatin  (CRESTOR ) 20 MG tablet Take 1 tablet by mouth once daily 90 tablet 0   zinc  sulfate 220 (50 Zn) MG capsule Take 1 capsule (220 mg total) by mouth daily. 14 capsule 0   No current facility-administered medications for this visit.    REVIEW OF SYSTEMS  All other systems were reviewed and are negative  Objective:  Objective   There were no vitals filed for this visit. There is no height or weight on file to calculate BMI.  Physical Exam General: no acute distress Cardiac: hemodynamically stable Pulm: normal work of breathing Neuro: alert, no focal deficit Extremities: *** Vascular:   Right: palpable brachial, radial  Left: palpable brachial, radial   Data: UE arterial duplex ***   Vein mapping ***  Note from nephrologist reviewed ***      Assessment/Plan:     Treyon Wymore is a 33 y.o. male with {CKDACcess:31998} who presents to discuss permanent access creation.  Dominant hand: {RIGHT/LEFT:20294} Previous access surgeries: *** Previous catheters: {RIGHT/LEFT:21944} IJ Currently dialyzing via *** on *** Other arm surgeries/injuries: ***  The vein mapping suggests there is *** a suitable *** and we discussed that they are a candidate for ***  The risks an benefits including of access creation were reviewed including: need for additional procedures, need for additional creations, steal, ischemia monomelic neuropathy, failure of access, and bleeding. The patient expressed understand and is willing to proceed.  I explained that I will perform intraoperative vein mapping to confirm the above findings and will determine the most appropriate access to create but with  a preoperative plan for {RIGHT/LEFT:20294} arm ***.     Philipp Brawn MD [facility]

## 2023-10-18 NOTE — H&P (View-Only) (Signed)
 Patient ID: Paul [name], male   DOB: January 24, 1991, 33 y.o.   MRN: [id number]  Reason for Consult: Follow-up   Referred by Vallorie Gayer, DO  Subjective:  HPI Paul [name] is a 33 y.o. male who presents for evaluation HD access creation.  Past Medical History: 09/11/2016: Acute hypoxemic respiratory failure (HCC) No date: Anemia No date: Chronic kidney disease     Comment:  ARF on CRF Dialysis T/TH/Sa No date: Complication of anesthesia     Comment:  per patient blood pressure drops No date: Diabetes mellitus     Comment:  Type II No date: End stage renal disease on dialysis [facility])     Comment:  [place]   No date: GERD (gastroesophageal reflux disease)     Comment:  diet controlled No date: HLD (hyperlipidemia) No date: Hypertension No date: Hypothyroidism No date: Morbid obesity (HCC) No date: Sacral wound     Comment:  resolved No date: Thyroid  disease  Family History  Problem Relation Age of Onset   Heart disease Mother    Hypertension Mother    Past Surgical History:  Procedure Laterality Date   AMPUTATION Left 05/08/2021   Procedure: AMPUTATION GREAT TOE;  Surgeon: Timothy Ford, MD;  Location: [facility] OR;  Service: Orthopedics;  Laterality: Left;   AMPUTATION Left 10/01/2022   Procedure: LEFT FOOT AMPUTATION RAY 1st;  Surgeon: Timothy Ford, MD;  Location: [facility] OR;  Service: Orthopedics;  Laterality: Left;   APPLICATION OF WOUND VAC Right 08/17/2023   Procedure: APPLICATION, WOUND VAC;  Surgeon: Philipp Brawn, MD;  Location: [facility] OR;  Service: Vascular;  Laterality: Right;   AV FISTULA PLACEMENT Left 09/26/2016   Procedure: LEFT UPPER ARM ARTERIOVENOUS (AV) FISTULA CREATION;  Surgeon: Richrd Char, MD;  Location: [facility] OR;  Service: Vascular;  Laterality: Left;   AV FISTULA PLACEMENT Left 12/02/2016   Procedure: INSERTION OF ARTERIOVENOUS GORE-TEX GRAFT LEFT UPPER  ARM USING A 4-7MM BY 45CM GRAFT ;  Surgeon: Mayo Speck, MD;  Location: [facility] OR;  Service:  Vascular;  Laterality: Left;   AV FISTULA PLACEMENT Right 07/03/2020   Procedure: INSERTION OF ARTERIOVENOUS (AV) GORE-TEX GRAFT RIGHT UPPER ARM;  Surgeon: Young Hensen, MD;  Location: [facility] OR;  Service: Vascular;  Laterality: Right;   AV FISTULA PLACEMENT Left 07/02/2021   Procedure: LEFT UPPER EXTREMITY ARTERIOVENOUS FISTULA CREATION;  Surgeon: Adine Hoof, MD;  Location: [facility] OR;  Service: Vascular;  Laterality: Left;   AV FISTULA PLACEMENT Right 05/12/2023   Procedure: RIGHT UPPER EXTREMITY ARTERIOVENOUS (AV) FISTULA CREATION;  Surgeon: Adine Hoof, MD;  Location: [facility] OR;  Service: Vascular;  Laterality: Right;   AVGG REMOVAL Right 08/17/2023   Procedure: REMOVAL OF ARTERIOVENOUS GORETEX GRAFT (AVGG);  Surgeon: Philipp Brawn, MD;  Location: [facility] OR;  Service: Vascular;  Laterality: Right;   BASCILIC VEIN TRANSPOSITION Right 05/06/2020   Procedure: FIRST STAGE RIGHT BASCILIC VEIN TRANSPOSITION;  Surgeon: Margherita Shell, MD;  Location: [facility] OR;  Service: Vascular;  Laterality: Right;   BASCILIC VEIN TRANSPOSITION Left 08/20/2021   Procedure: LEFT ARM SECOND STAGE WITH ARTERIOVENOUS GRAFT;  Surgeon: Adine Hoof, MD;  Location: [facility] OR;  Service: Vascular;  Laterality: Left;   BASCILIC VEIN TRANSPOSITION Right 08/01/2023   Procedure: RIGHT UPPER ARM FISTULA LIGATION;  Surgeon: Adine Hoof, MD;  Location: [facility] OR;  Service: Vascular;  Laterality: Right;   DIALYSIS/PERMA CATHETER INSERTION N/A 06/21/2023   Procedure: DIALYSIS/PERMA CATHETER INSERTION;  Surgeon: Aloysius Janus  W, MD;  [facility]: [facility] INVASIVE CV LAB;  Service: Cardiovascular;  Laterality: N/A;   DIALYSIS/PERMA CATHETER REMOVAL N/A 06/21/2023   Procedure: DIALYSIS/PERMA CATHETER REMOVAL;  Surgeon: Patrick Boor, MD;  Location: [facility] INVASIVE CV LAB;  Service: Cardiovascular;  Laterality: N/A;   EXCHANGE OF A DIALYSIS CATHETER Right 10/08/2016   Procedure: EXCHANGE OF A DIALYSIS CATHETER-RIGHT  INTERNAL JUGULAR;  Surgeon: Arvil Lauber, MD;  Location: [facility] OR;  Service: Vascular;  Laterality: Right;   INSERTION OF ARTERIOVENOUS (AV) ARTEGRAFT ARM Right 08/01/2023   Procedure: INSERTION, GRAFT, ARTERIOVENOUS, RIGHT UPPER EXTREMITY USING GORE TEX;  Surgeon: Adine Hoof, MD;  Location: [facility] OR;  Service: Vascular;  Laterality: Right;   INSERTION OF DIALYSIS CATHETER Right 09/26/2016   Procedure: INSERTION OF DIALYSIS CATHETER - Right Internal Jugular Placement;  Surgeon: Richrd Char, MD;  [facility] OR;  Service: Vascular;  Laterality: Right;   INSERTION OF DIALYSIS CATHETER Left 03/22/2018   Procedure: INSERTION OF DIALYSIS CATHETER;  Surgeon: Young Hensen, MD;  Location: [facility] OR;  Service: Vascular;  Laterality: Left;   INTRAOPERATIVE ARTERIOGRAM Left 05/04/2022   Procedure: INTRA OPERATIVE ARTERIOGRAM;  Surgeon: Kayla Part, MD;  Location: [facility] OR;  Service: Vascular;  Laterality: Left;   IR AV DIALY SHUNT INTRO NEEDLE/INTRAC INITIAL W/PTA/STENT/IMG LT Left 02/17/2023   IR AV DIALY SHUNT INTRO NEEDLE/INTRACATH INITIAL W/PTA/IMG LEFT  08/19/2022   IR AV DIALY SHUNT INTRO NEEDLE/INTRACATH INITIAL W/PTA/IMG LEFT  11/14/2022   IR DIALY SHUNT INTRO NEEDLE/INTRACATH INITIAL W/IMG LEFT Left 04/15/2022   IR FLUORO GUIDE CV LINE LEFT  04/19/2022   IR FLUORO GUIDE CV LINE LEFT  05/30/2022   IR FLUORO GUIDE CV LINE LEFT  03/24/2023   IR FLUORO GUIDE CV LINE RIGHT  03/26/2021   IR FLUORO GUIDE CV LINE RIGHT  05/13/2021   IR FLUORO GUIDE CV LINE RIGHT  05/04/2022   IR FLUORO GUIDE CV LINE RIGHT  02/17/2023   IR REMOVAL TUN CV CATH W/O FL  05/11/2021   IR REMOVAL TUN CV CATH W/O FL  10/06/2021   IR THROMBECTOMY AV FISTULA W/THROMBOLYSIS/PTA INC/SHUNT/IMG LEFT Left 02/02/2022   IR THROMBECTOMY AV FISTULA W/THROMBOLYSIS/PTA INC/SHUNT/IMG LEFT Left 03/22/2022   IR THROMBECTOMY AV FISTULA W/THROMBOLYSIS/PTA INC/SHUNT/IMG LEFT Left 05/04/2022   IR THROMBECTOMY AV FISTULA  W/THROMBOLYSIS/PTA INC/SHUNT/IMG LEFT Left 12/09/2022   IR THROMBECTOMY AV FISTULA W/THROMBOLYSIS/PTA/STENT INC/SHUNT/IMG LT Left 04/27/2022   IR US  GUIDE VASC ACCESS LEFT  02/02/2022   IR US  GUIDE VASC ACCESS LEFT  03/22/2022   IR US  GUIDE VASC ACCESS LEFT  04/15/2022   IR US  GUIDE VASC ACCESS LEFT  04/19/2022   IR US  GUIDE VASC ACCESS LEFT  04/27/2022   IR US  GUIDE VASC ACCESS LEFT  12/13/2022   IR US  GUIDE VASC ACCESS LEFT  02/21/2023   IR US  GUIDE VASC ACCESS RIGHT  03/26/2021   IR US  GUIDE VASC ACCESS RIGHT  05/13/2021   IR US  GUIDE VASC ACCESS RIGHT  05/04/2022   IR US  GUIDE VASC ACCESS RIGHT  02/17/2023   PERIPHERAL VASCULAR BALLOON ANGIOPLASTY  06/21/2023   Procedure: PERIPHERAL VASCULAR BALLOON ANGIOPLASTY;  Surgeon: Patrick Boor, MD;  [facility]: [facility] INVASIVE CV LAB;  Service: Cardiovascular;;  Innominate Vein   REVISON OF ARTERIOVENOUS FISTULA Left 03/22/2018   Procedure: LEFT UPPER EXTREMITY ARTERIOVENOUS  REVISION WITH GORE-TEX GRAFT.;  Surgeon: Young Hensen, MD;  Location: [facility] OR;  Service: Vascular;  Laterality: Left;   TEE WITHOUT CARDIOVERSION N/A 05/12/2021  Procedure: TRANSESOPHAGEAL ECHOCARDIOGRAM (TEE);  Surgeon: Jann Melody, MD;  Location: [facility] ENDOSCOPY;  Service: Cardiovascular;  Laterality: N/A;   THROMBECTOMY Left 08/26/2019   thrombectomy of LUA loop AVG   THROMBECTOMY AND REVISION OF ARTERIOVENTOUS (AV) GORETEX  GRAFT Right 07/27/2020   Procedure: INSERTION OF RIGHT ARM LOOP GRAFT WITH EXCISION OF RIGHT ARM BRACHIAL AXILLARY GRAFT;  Surgeon: Young Hensen, MD;  [facility] OR;  Service: Vascular;  Laterality: Right;   THROMBECTOMY BRACHIAL ARTERY Left 05/04/2022   Procedure: LEFT ARM RADIAL BRACHIAL ARTERY EMBOLECTOMY;  Surgeon: Kayla Part, MD;  Location: [facility] OR;  Service: Vascular;  Laterality: Left;   UPPER EXTREMITY VENOGRAPHY Bilateral 06/14/2021   Procedure: UPPER EXTREMITY VENOGRAPHY;  Surgeon: Adine Hoof, MD;  Location: [facility]  INVASIVE CV LAB;  Service: Cardiovascular;  Laterality: Bilateral;   UPPER EXTREMITY VENOGRAPHY N/A 04/24/2023   Procedure: UPPER EXTREMITY VENOGRAPHY;  Surgeon: Adine Hoof, MD;  Location: [facility] INVASIVE CV LAB;  Service: Cardiovascular;  Laterality: N/A;   VISCERAL ANGIOGRAPHY Right 06/17/2020   Procedure: CENTRAL VENO;  Surgeon: Carlene Che, MD;  Location: [facility] INVASIVE CV LAB;  Service: Cardiovascular;  Laterality: Right;   WOUND DEBRIDEMENT Right 08/17/2023   Procedure: DEBRIDEMENT, WOUND;  Surgeon: Philipp Brawn, MD;  Location: [facility] OR;  Service: Vascular;  Laterality: Right;    Short Social History:  Social History   Tobacco Use   Smoking status: Never   Smokeless tobacco: Never  Substance Use Topics   Alcohol use: No    Allergies  Allergen Reactions   Hydralazine  Hcl Other (See Comments)    DRUG-INDUCED LUPUS    Current Outpatient Medications  Medication Sig Dispense Refill   aspirin  EC 81 MG tablet Take 1 tablet (81 mg total) by mouth daily. Swallow whole. 90 tablet 0   ELIQUIS  5 MG TABS tablet Take 1 tablet by mouth twice daily 60 tablet 0   ferric citrate  (AURYXIA ) 1 GM 210 MG(Fe) tablet Take 2-4 tablets (420-840 mg total) by mouth See admin instructions. Take 4 tablets (840 mg) by mouth with each meal & take 2 tablets (420 mg) by mouth with each snack. 270 tablet 0   gabapentin  (NEURONTIN ) 100 MG capsule TAKE 1 CAPSULE BY MOUTH THREE TIMES DAILY 90 capsule 0   gabapentin  (NEURONTIN ) 300 MG capsule Take 1 capsule by mouth at bedtime 30 capsule 0   LANTUS  100 UNIT/ML injection INJECT 5 UNITS SUBCUTANEOUSLY TWICE DAILY 10 mL 0   levofloxacin  (LEVAQUIN ) 500 MG tablet Take 1.5 tablets (750 mg total) by mouth every other day AND 1 tablet (500 mg total) every other day. 10 tablet 0   levothyroxine  (SYNTHROID ) 150 MCG tablet TAKE 1 TABLET BY MOUTH ONCE DAILY BEFORE BREAKFAST * APPOINTMENT REQUIRED FOR FUTURE REFILLS * 15 tablet 0   midodrine  (PROAMATINE ) 5 MG tablet  Take 1 tablet (5 mg total) by mouth Every Tuesday,Thursday,and Saturday with dialysis. 30 tablet 3   minocycline  (MINOCIN ) 100 MG capsule Take 2 capsules (200 mg total) by mouth 2 (two) times daily. 63 capsule 0   multivitamin (RENA-VIT) TABS tablet Take 1 tablet by mouth at bedtime. 30 tablet 0   rosuvastatin  (CRESTOR ) 20 MG tablet Take 1 tablet by mouth once daily 90 tablet 0   zinc  sulfate 220 (50 Zn) MG capsule Take 1 capsule (220 mg total) by mouth daily. 14 capsule 0   No current facility-administered medications for this visit.    REVIEW OF SYSTEMS  All other systems were  reviewed and are negative  Objective:  Objective   Vitals:   10/20/23 0910  BP: 109/77  Pulse: 99  Resp: 20  Temp: 97.9 F (36.6 C)  TempSrc: Temporal  SpO2: 95%  Weight: (!) 356 lb 4.8 oz (161.6 kg)  Height: 6' 2 (1.88 m)   Body mass index is 45.75 kg/m.  Physical Exam General: no acute distress Cardiac: hemodynamically stable Pulm: normal work of breathing Neuro: alert, no focal deficit Extremities: Healed left arm incisions from graft removal, TDC right IJ Vascular:   Right: palpable radial    Data: Reviewed the venogram from Dr. Vikki [name] Bilateral central systems are occluded or severely diseased      Assessment/Plan:     Paul [name] is a 33 y.o. male with ESRD who presents to discuss permanent access creation.   He has had a multiple access surgeries in bilateral arms and most recently has had an infected graft in the right which was excised.  All central systems are occluded or severely diseased.  We discussed that he would need either a hero graft or lower extremity access.  We also discussed the options of PD but I explained that he would need to lose a significant amount of weight before that would be appropriate.  Will plan for hero graft with Dr. Vikki [name] in the coming weeks.    Philipp Brawn MD [facility]

## 2023-10-20 ENCOUNTER — Encounter: Payer: Self-pay | Admitting: Vascular Surgery

## 2023-10-20 ENCOUNTER — Other Ambulatory Visit: Payer: Self-pay | Admitting: Student

## 2023-10-20 ENCOUNTER — Ambulatory Visit: Attending: Vascular Surgery | Admitting: Vascular Surgery

## 2023-10-20 VITALS — BP 109/77 | HR 99 | Temp 97.9°F | Resp 20 | Ht 74.0 in | Wt 356.3 lb

## 2023-10-20 DIAGNOSIS — N186 End stage renal disease: Secondary | ICD-10-CM | POA: Insufficient documentation

## 2023-10-20 DIAGNOSIS — Z794 Long term (current) use of insulin: Secondary | ICD-10-CM

## 2023-10-21 DIAGNOSIS — Z992 Dependence on renal dialysis: Secondary | ICD-10-CM | POA: Diagnosis not present

## 2023-10-21 DIAGNOSIS — N186 End stage renal disease: Secondary | ICD-10-CM | POA: Diagnosis not present

## 2023-10-21 DIAGNOSIS — E1129 Type 2 diabetes mellitus with other diabetic kidney complication: Secondary | ICD-10-CM | POA: Diagnosis not present

## 2023-10-23 ENCOUNTER — Telehealth: Payer: Self-pay

## 2023-10-23 NOTE — Telephone Encounter (Signed)
 Attempted call to home number.  No answer / No VM

## 2023-10-23 NOTE — Telephone Encounter (Signed)
 Attempted call for surgery scheduling.  No answer / No VM

## 2023-10-26 ENCOUNTER — Other Ambulatory Visit: Payer: Self-pay

## 2023-10-26 DIAGNOSIS — N186 End stage renal disease: Secondary | ICD-10-CM

## 2023-10-30 ENCOUNTER — Telehealth: Payer: Self-pay

## 2023-10-30 ENCOUNTER — Other Ambulatory Visit: Payer: Self-pay

## 2023-10-30 NOTE — Telephone Encounter (Signed)
 Patient left message for surgery scheduler stating that he needed to cancel surgery on 6/13.  Did not give reason.  This nurse attempted to return call.  No answer / No VM.

## 2023-10-31 ENCOUNTER — Encounter: Payer: Self-pay | Admitting: *Deleted

## 2023-11-01 ENCOUNTER — Other Ambulatory Visit: Payer: Self-pay

## 2023-11-01 ENCOUNTER — Encounter (HOSPITAL_COMMUNITY): Payer: Self-pay | Admitting: Vascular Surgery

## 2023-11-01 NOTE — Progress Notes (Signed)
 PCP - Dameron, Marisa, DO  Cardiologist -   PPM/ICD - denies Device Orders - n/a Rep Notified - n/a  Chest x-ray - 07-30-22 EKG - DOS Stress Test - 03-06-20 (CE) ECHO - TEE- 05-12-21 Cardiac Cath -   CPAP - denies  Fasting Blood Sugar - Per patient blood sugar range 110-130 Checks Blood Sugar BID Last A1c on 08-17-23 7.4  Blood Thinner Instructions: ELIQUIS  Hold 2 days prior to procedure last dose 11-07-23 Aspirin  Instructions: cont.  ERAS Protcol - NPO  COVID TEST- n/a  Anesthesia review: Yes, hx of HTN, DM, Blood pressures drop due to anesthesia  Patient verbally denies any shortness of breath, fever, cough and chest pain during phone call   -------------  SDW INSTRUCTIONS given:  Your procedure is scheduled on November 10, 2023.  Report to [facility] Main Entrance A at 5:30 A.M., and check in at the Admitting office.  Call this number if you have problems the morning of surgery:  [phone]   Remember:  Do not eat or drink  after midnight the night before your surgery     Take these medicines the morning of surgery with A SIP OF WATER   aspirin   gabapentin  (NEURONTIN )  levothyroxine  (SYNTHROID )  rosuvastatin  (CRESTOR )  WHAT DO I DO ABOUT MY DIABETES MEDICATION?   Do not take oral diabetes medicines (pills) the morning of surgery.  THE NIGHT BEFORE SURGERY, take 2 or 3  units of  LANTUS  insulin .      THE MORNING OF SURGERY, take 2 or 3 units of LANTUS  insulin .  The day of surgery, do not take other diabetes injectables, including Byetta (exenatide), Bydureon (exenatide ER), Victoza (liraglutide), or Trulicity (dulaglutide).  If your CBG is greater than 220 mg/dL, you may take  of your sliding scale (correction) dose of insulin .   HOW TO MANAGE YOUR DIABETES BEFORE AND AFTER SURGERY  Why is it important to control my blood sugar before and after surgery? Improving blood sugar levels before and after surgery helps healing and can limit problems. A way  of improving blood sugar control is eating a healthy diet by:  Eating less sugar and carbohydrates  Increasing activity/exercise  Talking with your doctor about reaching your blood sugar goals High blood sugars (greater than 180 mg/dL) can raise your risk of infections and slow your recovery, so you will need to focus on controlling your diabetes during the weeks before surgery. Make sure that the doctor who takes care of your diabetes knows about your planned surgery including the date and location.  How do I manage my blood sugar before surgery? Check your blood sugar at least 4 times a day, starting 2 days before surgery, to make sure that the level is not too high or low.  Check your blood sugar the morning of your surgery when you wake up and every 2 hours until you get to the Short Stay unit.  If your blood sugar is less than 70 mg/dL, you will need to treat for low blood sugar: Do not take insulin . Treat a low blood sugar (less than 70 mg/dL) with  cup of clear juice (cranberry or apple), 4 glucose tablets, OR glucose gel. Recheck blood sugar in 15 minutes after treatment (to make sure it is greater than 70 mg/dL). If your blood sugar is not greater than 70 mg/dL on recheck, call [phone] for further instructions. Report your blood sugar to the short stay nurse when you get to Short Stay.  If you are  admitted to the hospital after surgery: Your blood sugar will be checked by the staff and you will probably be given insulin  after surgery (instead of oral diabetes medicines) to make sure you have good blood sugar levels. The goal for blood sugar control after surgery is 80-180 mg/dL.   As of today, STOP taking any Aspirin  (unless otherwise instructed by your surgeon) Aleve, Naproxen, Ibuprofen, Motrin, Advil, Goody's, BC's, all herbal medications, fish oil, and all vitamins.                      Do not wear jewelry, make up, or nail polish            Do not wear lotions, powders,  perfumes/colognes, or deodorant.            Do not shave 48 hours prior to surgery.  Men may shave face and neck.            Do not bring valuables to the hospital.            [facility] is not responsible for any belongings or valuables.  Do NOT Smoke (Tobacco/Vaping) 24 hours prior to your procedure If you use a CPAP at night, you may bring all equipment for your overnight stay.   Contacts, glasses, dentures or bridgework may not be worn into surgery.      For patients admitted to the hospital, discharge time will be determined by your treatment team.   Patients discharged the day of surgery will not be allowed to drive home, and someone needs to stay with them for 24 hours.    Special instructions:   [place]- Preparing For Surgery  Before surgery, you can play an important role. Because skin is not sterile, your skin needs to be as free of germs as possible. You can reduce the number of germs on your skin by washing with CHG (chlorahexidine gluconate) Soap before surgery.  CHG is an antiseptic cleaner which kills germs and bonds with the skin to continue killing germs even after washing.    Oral Hygiene is also important to reduce your risk of infection.  Remember - BRUSH YOUR TEETH THE MORNING OF SURGERY WITH YOUR REGULAR TOOTHPASTE  Please do not use if you have an allergy to CHG or antibacterial soaps. If your skin becomes reddened/irritated stop using the CHG.  Do not shave (including legs and underarms) for at least 48 hours prior to first CHG shower. It is OK to shave your face.  Please follow these instructions carefully.   Shower the NIGHT BEFORE SURGERY and the MORNING OF SURGERY with DIAL  Soap.   Pat yourself dry with a CLEAN TOWEL.  Wear CLEAN PAJAMAS to bed the night before surgery  Place CLEAN SHEETS on your bed the night of your first shower and DO NOT SLEEP WITH PETS.   Day of Surgery: Please shower morning of surgery  Wear Clean/Comfortable clothing the  morning of surgery Do not apply any deodorants/lotions.   Remember to brush your teeth WITH YOUR REGULAR TOOTHPASTE.   Questions were answered. Patient verbalized understanding of instructions.

## 2023-11-03 ENCOUNTER — Encounter (HOSPITAL_COMMUNITY): Admission: RE | Payer: Self-pay | Source: Home / Self Care

## 2023-11-03 ENCOUNTER — Ambulatory Visit (HOSPITAL_COMMUNITY): Admission: RE | Admit: 2023-11-03 | Source: Home / Self Care | Admitting: Vascular Surgery

## 2023-11-03 SURGERY — INSERTION, CATHETER, HERO
Anesthesia: Choice | Laterality: Right

## 2023-11-06 ENCOUNTER — Other Ambulatory Visit: Payer: Self-pay | Admitting: Student

## 2023-11-06 DIAGNOSIS — E039 Hypothyroidism, unspecified: Secondary | ICD-10-CM

## 2023-11-09 NOTE — Anesthesia Preprocedure Evaluation (Addendum)
 Anesthesia Evaluation  Patient identified by MRN, date of birth, ID band Patient awake    Reviewed: Allergy & Precautions, NPO status , Patient's Chart, lab work & pertinent test results  History of Anesthesia Complications (+) DIFFICULT AIRWAY and history of anesthetic complications  Airway Mallampati: II  TM Distance: >3 FB Neck ROM: Full    Dental  (+) Dental Advisory Given   Pulmonary neg pulmonary ROS   Pulmonary exam normal        Cardiovascular hypertension, + Peripheral Vascular Disease  Normal cardiovascular exam     Neuro/Psych  Headaches  negative psych ROS   GI/Hepatic Neg liver ROS,GERD  ,,  Endo/Other  diabetes, Type 2, Insulin  DependentHypothyroidism  Class 3 obesity  Renal/GU ESRF and DialysisRenal disease     Musculoskeletal negative musculoskeletal ROS (+)    Abdominal  (+) + obese  Peds  Hematology negative hematology ROS (+)  On eliquis     Anesthesia Other Findings   Reproductive/Obstetrics                             Anesthesia Physical Anesthesia Plan  ASA: 3  Anesthesia Plan: General   Post-op Pain Management: Tylenol  PO (pre-op )*   Induction: Intravenous  PONV Risk Score and Plan: 3 and Treatment may vary due to age or medical condition, Ondansetron , Dexamethasone  and Midazolam   Airway Management Planned: LMA  Additional Equipment: None  Intra-op Plan:   Post-operative Plan: Extubation in OR  Informed Consent: I have reviewed the patients History and Physical, chart, labs and discussed the procedure including the risks, benefits and alternatives for the proposed anesthesia with the patient or authorized representative who has indicated his/her understanding and acceptance.     Dental advisory given  Plan Discussed with: CRNA and Anesthesiologist  Anesthesia Plan Comments: (PAT note written Unclear hx regarding difficult intubation. Last two  airway notes - 1 was elective glidescope without DL attempt per record, the more recent intubation was documented as grade 1 view with Mac 4 (DL), yet in the comments wrote Recommend glide. )       Anesthesia Quick Evaluation

## 2023-11-09 NOTE — Progress Notes (Signed)
 Anesthesia Chart Review: Paul [name]  Case: [id number] Date/Time: 11/10/23 0715   Procedure: INSERTION, CATHETER, HERO (Right)   Anesthesia type: Choice   Diagnosis: ESRD (end stage renal disease) (HCC) [N18.6]   Pre-op  diagnosis: ESRD   [facility]OM [facility] / [facility]   Surgeons: Adine Hoof, MD       DISCUSSION: Patient is a 33 year old male scheduled for the above procedure.   History includes never smoker, DIFFICULT INTUBATION, HTN (hypotension with anesthesia), DM2, HLD, ESRD, GERD, hypothyroidism, anemia, morbid obesity, osteomyelitis (s/p left great toe amputation 05/08/21, left foot 1st ray amputation 10/01/22), MSSA bacteremia (with SVC thrombus, possibly septic thrombus vs related to HD catheter on TEE 04/2021, s/p anticoagulation and 6 weeks cefazoliln).  For DIFFICULT INTBUATION history: - 08/17/23: Grade 1; Oral Pharyngeal Airway; 4; Oral; 7.5 mm; Cuffed; Stylet; Direct Visualization, ETCO2 (Capnography), Bilateral Breath Sounds, Chest Rise; 23 cm  - 08/01/23: IV induction, rapid sequence, 4 and MAC, Grade 1 view, 7.5 mm ETT. Comments: Recommend Glide. - 05/12/23: LMA 5.0 (RR remained high and SpO2 < 90. Decision to intubate.Aaron AasAaron AasPressures being treated with phenylephrine  bolus and gtt) ---> Intubation: Mask ventilation without difficulty. Glidescope and 4 (lopro), 7.0 mm what was readily available (not d/t patient circumstance), 1 attempt  Anesthesia team to evaluate on the day of surgery. Per VVS, hold Eliquis  for 2 days prior to surgery. Last dose 11/07/23.    VS: Ht 6' 2 (1.88 m)   Wt (!) 161.6 kg   BMI 45.74 kg/m  Wt Readings from Last 3 Encounters:  10/20/23 (!) 161.6 kg  09/13/23 (!) 163.5 kg  08/17/23 (!) 159 kg   BP Readings from Last 3 Encounters:  10/20/23 109/77  09/13/23 (!) 136/94  08/18/23 (!) 135/95   Pulse Readings from Last 3 Encounters:  10/20/23 99  09/13/23 93  08/18/23 79     PROVIDERS: Vallorie Gayer, DO is PCP    LABS:  For day of surgery.  EKG: EKG 09/29/22:  Sinus tachycardia at 103 bpm Consider right atrial enlargement similar to prior No STEMI Confirmed by Wynell Heath ([id number]) on 10/03/2022 2:12:37 PM  CV: TEE 05/12/21: IMPRESSIONS   1. There is a large, echodensity 22 mm X 11mm in the SVC, with a long  filamentous stalk. The stalk is adjacent to the tricuspid valve at the tip  but appears to have a connection to the SVC lesion.   2. Left ventricular ejection fraction, by estimation, is 65 to 70%. The  left ventricle has normal function.   3. Right ventricular systolic function is normal. The right ventricular  size is normal.   4. No left atrial/left atrial appendage thrombus was detected.   5. The mitral valve is normal in structure. No evidence of mitral valve  regurgitation.   6. The aortic valve is tricuspid. Aortic valve regurgitation is not  visualized. No aortic stenosis is present.   Conclusion(s)/Recommendation(s): In the setting of bacteremia and recent  central line, cannot exclude septic thrombus arising from the SVC. Repeat  imaging after therapy (TEE vs Cardiac CT) is reasonable. Reached out to  primary and ID team post procedure.    Past Medical History:  Diagnosis Date   Acute hypoxemic respiratory failure (HCC) 09/11/2016   Anemia    Chronic kidney disease    ARF on CRF Dialysis T/TH/Sa   Complication of anesthesia    per patient blood pressure drops   Diabetes mellitus    Type II  Difficult intubation    End stage renal disease on dialysis [facility])    [place]    GERD (gastroesophageal reflux disease)    diet controlled   HLD (hyperlipidemia)    Hypertension    Hypothyroidism    Morbid obesity (HCC)    Sacral wound    resolved   Thyroid  disease     Past Surgical History:  Procedure Laterality Date   AMPUTATION Left 05/08/2021   Procedure: AMPUTATION GREAT TOE;  Surgeon: Timothy Ford, MD;  Location: [facility] OR;  Service: Orthopedics;  Laterality:  Left;   AMPUTATION Left 10/01/2022   Procedure: LEFT FOOT AMPUTATION RAY 1st;  Surgeon: Timothy Ford, MD;  Location: [facility] OR;  Service: Orthopedics;  Laterality: Left;   APPLICATION OF WOUND VAC Right 08/17/2023   Procedure: APPLICATION, WOUND VAC;  Surgeon: Philipp Brawn, MD;  Location: [facility] OR;  Service: Vascular;  Laterality: Right;   AV FISTULA PLACEMENT Left 09/26/2016   Procedure: LEFT UPPER ARM ARTERIOVENOUS (AV) FISTULA CREATION;  Surgeon: Richrd Char, MD;  [facility];  Service: Vascular;  Laterality: Left;   AV FISTULA PLACEMENT Left 12/02/2016   Procedure: INSERTION OF ARTERIOVENOUS GORE-TEX GRAFT LEFT UPPER  ARM USING A 4-7MM BY 45CM GRAFT ;  Surgeon: Mayo Speck, MD;  [facility];  Service: Vascular;  Laterality: Left;   AV FISTULA PLACEMENT Right 07/03/2020   Procedure: INSERTION OF ARTERIOVENOUS (AV) GORE-TEX GRAFT RIGHT UPPER ARM;  Surgeon: Young Hensen, MD;  [facility];  Service: Vascular;  Laterality: Right;   AV FISTULA PLACEMENT Left 07/02/2021   Procedure: LEFT UPPER EXTREMITY ARTERIOVENOUS FISTULA CREATION;  Surgeon: Adine Hoof, MD;  Location: [facility] OR;  Service: Vascular;  Laterality: Left;   AV FISTULA PLACEMENT Right 05/12/2023   Procedure: RIGHT UPPER EXTREMITY ARTERIOVENOUS (AV) FISTULA CREATION;  Surgeon: Adine Hoof, MD;  Location: [facility] OR;  Service: Vascular;  Laterality: Right;   AVGG REMOVAL Right 08/17/2023   Procedure: REMOVAL OF ARTERIOVENOUS GORETEX GRAFT (AVGG);  Surgeon: Philipp Brawn, MD;  Location: [facility] OR;  Service: Vascular;  Laterality: Right;   BASCILIC VEIN TRANSPOSITION Right 05/06/2020   Procedure: FIRST STAGE RIGHT BASCILIC VEIN TRANSPOSITION;  Surgeon: Margherita Shell, MD;  [facility];  Service: Vascular;  Laterality: Right;   BASCILIC VEIN TRANSPOSITION Left 08/20/2021   Procedure: LEFT ARM SECOND STAGE WITH ARTERIOVENOUS GRAFT;  Surgeon: Adine Hoof, MD;  Location: [facility] OR;  Service:  Vascular;  Laterality: Left;   BASCILIC VEIN TRANSPOSITION Right 08/01/2023   Procedure: RIGHT UPPER ARM FISTULA LIGATION;  Surgeon: Adine Hoof, MD;  Location: [facility] OR;  Service: Vascular;  Laterality: Right;   DIALYSIS/PERMA CATHETER INSERTION N/A 06/21/2023   Procedure: DIALYSIS/PERMA CATHETER INSERTION;  Surgeon: Patrick Boor, MD;  [facility];  Service: Cardiovascular;  Laterality: N/A;   DIALYSIS/PERMA CATHETER REMOVAL N/A 06/21/2023   Procedure: DIALYSIS/PERMA CATHETER REMOVAL;  Surgeon: Patrick Boor, MD;  Location: [facility] INVASIVE CV LAB;  Service: Cardiovascular;  Laterality: N/A;   EXCHANGE OF A DIALYSIS CATHETER Right 10/08/2016   Procedure: EXCHANGE OF A DIALYSIS CATHETER-RIGHT INTERNAL JUGULAR;  Surgeon: Arvil Lauber, MD;  [facility];  Service: Vascular;  Laterality: Right;   INSERTION OF ARTERIOVENOUS (AV) ARTEGRAFT ARM Right 08/01/2023   Procedure: INSERTION, GRAFT, ARTERIOVENOUS, RIGHT UPPER EXTREMITY USING GORE TEX;  Surgeon: Adine Hoof, MD;  Location: [facility] OR;  Service: Vascular;  Laterality: Right;   INSERTION OF  DIALYSIS CATHETER Right 09/26/2016   Procedure: INSERTION OF DIALYSIS CATHETER - Right Internal Jugular Placement;  Surgeon: Richrd Char, MD;  [facility];  Service: Vascular;  Laterality: Right;   INSERTION OF DIALYSIS CATHETER Left 03/22/2018   Procedure: INSERTION OF DIALYSIS CATHETER;  Surgeon: Young Hensen, MD;  [facility];  Service: Vascular;  Laterality: Left;   INTRAOPERATIVE ARTERIOGRAM Left 05/04/2022   Procedure: INTRA OPERATIVE ARTERIOGRAM;  Surgeon: Kayla Part, MD;  Location: [facility] OR;  Service: Vascular;  Laterality: Left;   IR AV DIALY SHUNT INTRO NEEDLE/INTRAC INITIAL W/PTA/STENT/IMG LT Left 02/17/2023   IR AV DIALY SHUNT INTRO NEEDLE/INTRACATH INITIAL W/PTA/IMG LEFT  08/19/2022   IR AV DIALY SHUNT INTRO NEEDLE/INTRACATH INITIAL W/PTA/IMG LEFT  11/14/2022   IR DIALY SHUNT INTRO NEEDLE/INTRACATH  INITIAL W/IMG LEFT Left 04/15/2022   IR FLUORO GUIDE CV LINE LEFT  04/19/2022   IR FLUORO GUIDE CV LINE LEFT  05/30/2022   IR FLUORO GUIDE CV LINE LEFT  03/24/2023   IR FLUORO GUIDE CV LINE RIGHT  03/26/2021   IR FLUORO GUIDE CV LINE RIGHT  05/13/2021   IR FLUORO GUIDE CV LINE RIGHT  05/04/2022   IR FLUORO GUIDE CV LINE RIGHT  02/17/2023   IR REMOVAL TUN CV CATH W/O FL  05/11/2021   IR REMOVAL TUN CV CATH W/O FL  10/06/2021   IR THROMBECTOMY AV FISTULA W/THROMBOLYSIS/PTA INC/SHUNT/IMG LEFT Left 02/02/2022   IR THROMBECTOMY AV FISTULA W/THROMBOLYSIS/PTA INC/SHUNT/IMG LEFT Left 03/22/2022   IR THROMBECTOMY AV FISTULA W/THROMBOLYSIS/PTA INC/SHUNT/IMG LEFT Left 05/04/2022   IR THROMBECTOMY AV FISTULA W/THROMBOLYSIS/PTA INC/SHUNT/IMG LEFT Left 12/09/2022   IR THROMBECTOMY AV FISTULA W/THROMBOLYSIS/PTA/STENT INC/SHUNT/IMG LT Left 04/27/2022   IR US  GUIDE VASC ACCESS LEFT  02/02/2022   IR US  GUIDE VASC ACCESS LEFT  03/22/2022   IR US  GUIDE VASC ACCESS LEFT  04/15/2022   IR US  GUIDE VASC ACCESS LEFT  04/19/2022   IR US  GUIDE VASC ACCESS LEFT  04/27/2022   IR US  GUIDE VASC ACCESS LEFT  12/13/2022   IR US  GUIDE VASC ACCESS LEFT  02/21/2023   IR US  GUIDE VASC ACCESS RIGHT  03/26/2021   IR US  GUIDE VASC ACCESS RIGHT  05/13/2021   IR US  GUIDE VASC ACCESS RIGHT  05/04/2022   IR US  GUIDE VASC ACCESS RIGHT  02/17/2023   PERIPHERAL VASCULAR BALLOON ANGIOPLASTY  06/21/2023   Procedure: PERIPHERAL VASCULAR BALLOON ANGIOPLASTY;  Surgeon: Patrick Boor, MD;  [facility] INVAS[facility] CV LAB;  Service: Cardiovascular;;  Innominate Vein   REVISON OF ARTERIOVENOUS FISTULA Left 03/22/2018   Procedure: LEFT UPPER EXTREMITY ARTERIOVENOUS  REVISION WITH GORE-TEX GRAFT.;  Surgeon: Young Hensen, MD;  Location: [facility] OR;  Service: Vascular;  Laterality: Left;   TEE WITHOUT CARDIOVERSION N/A 05/12/2021   Procedure: TRANSESOPHAGEAL ECHOCARDIOGRAM (TEE);  Surgeon: Jann Melody, MD;  Location: [facility] ENDOSCOPY;  Service:  Cardiovascular;  Laterality: N/A;   THROMBECTOMY Left 08/26/2019   thrombectomy of LUA loop AVG   THROMBECTOMY AND REVISION OF ARTERIOVENTOUS (AV) GORETEX  GRAFT Right 07/27/2020   Procedure: INSERTION OF RIGHT ARM LOOP GRAFT WITH EXCISION OF RIGHT ARM BRACHIAL AXILLARY GRAFT;  Surgeon: Young Hensen, MD;  [facility];  Service: Vascular;  Laterality: Right;   THROMBECTOMY BRACHIAL ARTERY Left 05/04/2022   Procedure: LEFT ARM RADIAL BRACHIAL ARTERY EMBOLECTOMY;  Surgeon: Kayla Part, MD;  Location: [facility] OR;  Service: Vascular;  Laterality: Left;   UPPER EXTREMITY VENOGRAPHY Bilateral 06/14/2021   Procedure: UPPER  EXTREMITY VENOGRAPHY;  Surgeon: Adine Hoof, MD;  Location: [facility] INVASIVE CV LAB;  Service: Cardiovascular;  Laterality: Bilateral;   UPPER EXTREMITY VENOGRAPHY N/A 04/24/2023   Procedure: UPPER EXTREMITY VENOGRAPHY;  Surgeon: Adine Hoof, MD;  Location: [facility] INVASIVE CV LAB;  Service: Cardiovascular;  Laterality: N/A;   VISCERAL ANGIOGRAPHY Right 06/17/2020   Procedure: CENTRAL VENO;  Surgeon: Carlene Che, MD;  Location: [facility] INVASIVE CV LAB;  Service: Cardiovascular;  Laterality: Right;   WOUND DEBRIDEMENT Right 08/17/2023   Procedure: DEBRIDEMENT, WOUND;  Surgeon: Philipp Brawn, MD;  Location: [facility] OR;  Service: Vascular;  Laterality: Right;    MEDICATIONS: No current facility-administered medications for this encounter.    acetaminophen  (TYLENOL ) 500 MG tablet   aspirin  EC 81 MG tablet   calcium  elemental as carbonate (TUMS ULTRA 1000) 400 MG chewable tablet   cinacalcet  (SENSIPAR ) 30 MG tablet   diphenhydrAMINE  (BENADRYL ) 25 MG tablet   ELIQUIS  5 MG TABS tablet   ferric citrate  (AURYXIA ) 1 GM 210 MG(Fe) tablet   gabapentin  (NEURONTIN ) 100 MG capsule   LANTUS  100 UNIT/ML injection   midodrine  (PROAMATINE ) 5 MG tablet   multivitamin (RENA-VIT) TABS tablet   rosuvastatin  (CRESTOR ) 20 MG tablet   gabapentin  (NEURONTIN ) 300 MG capsule    levofloxacin  (LEVAQUIN ) 500 MG tablet   levothyroxine  (SYNTHROID ) 150 MCG tablet   minocycline  (MINOCIN ) 100 MG capsule  Not currently taking minocycline  or Levaquin .  Ella Gun, PA-C Surgical Short Stay/Anesthesiology [facility] Phone [phone] [facility] Phone [phone] 11/09/2023 1:44 PM

## 2023-11-10 ENCOUNTER — Ambulatory Visit (HOSPITAL_COMMUNITY)

## 2023-11-10 ENCOUNTER — Encounter (HOSPITAL_COMMUNITY)
Admission: RE | Disposition: A | Discharge status: Home / Self Care | Payer: Self-pay | Source: Home / Self Care | Attending: Vascular Surgery

## 2023-11-10 ENCOUNTER — Ambulatory Visit (HOSPITAL_COMMUNITY)
Admission: RE | Admit: 2023-11-10 | Discharge: 2023-11-10 | Disposition: A | Discharge status: Home / Self Care | Attending: Vascular Surgery | Admitting: Vascular Surgery

## 2023-11-10 ENCOUNTER — Other Ambulatory Visit: Payer: Self-pay

## 2023-11-10 ENCOUNTER — Other Ambulatory Visit (HOSPITAL_COMMUNITY): Payer: Self-pay

## 2023-11-10 ENCOUNTER — Ambulatory Visit (HOSPITAL_COMMUNITY): Payer: Self-pay | Admitting: Physician Assistant

## 2023-11-10 ENCOUNTER — Encounter (HOSPITAL_COMMUNITY): Payer: Self-pay | Admitting: Vascular Surgery

## 2023-11-10 DIAGNOSIS — Z992 Dependence on renal dialysis: Secondary | ICD-10-CM

## 2023-11-10 DIAGNOSIS — E039 Hypothyroidism, unspecified: Secondary | ICD-10-CM | POA: Insufficient documentation

## 2023-11-10 DIAGNOSIS — T8249XA Other complication of vascular dialysis catheter, initial encounter: Secondary | ICD-10-CM | POA: Diagnosis not present

## 2023-11-10 DIAGNOSIS — Z6841 Body Mass Index (BMI) 40.0 and over, adult: Secondary | ICD-10-CM | POA: Diagnosis not present

## 2023-11-10 DIAGNOSIS — Z89412 Acquired absence of left great toe: Secondary | ICD-10-CM | POA: Insufficient documentation

## 2023-11-10 DIAGNOSIS — I120 Hypertensive chronic kidney disease with stage 5 chronic kidney disease or end stage renal disease: Secondary | ICD-10-CM

## 2023-11-10 DIAGNOSIS — I871 Compression of vein: Secondary | ICD-10-CM

## 2023-11-10 DIAGNOSIS — Z79890 Hormone replacement therapy (postmenopausal): Secondary | ICD-10-CM | POA: Diagnosis not present

## 2023-11-10 DIAGNOSIS — Z794 Long term (current) use of insulin: Secondary | ICD-10-CM | POA: Diagnosis not present

## 2023-11-10 DIAGNOSIS — E1122 Type 2 diabetes mellitus with diabetic chronic kidney disease: Secondary | ICD-10-CM

## 2023-11-10 DIAGNOSIS — Z452 Encounter for adjustment and management of vascular access device: Secondary | ICD-10-CM | POA: Diagnosis not present

## 2023-11-10 DIAGNOSIS — N186 End stage renal disease: Secondary | ICD-10-CM

## 2023-11-10 DIAGNOSIS — K219 Gastro-esophageal reflux disease without esophagitis: Secondary | ICD-10-CM | POA: Insufficient documentation

## 2023-11-10 DIAGNOSIS — Z7901 Long term (current) use of anticoagulants: Secondary | ICD-10-CM | POA: Insufficient documentation

## 2023-11-10 DIAGNOSIS — E785 Hyperlipidemia, unspecified: Secondary | ICD-10-CM | POA: Insufficient documentation

## 2023-11-10 HISTORY — PX: INSERTION OF DIALYSIS CATHETER: SHX1324

## 2023-11-10 HISTORY — PX: VASCULAR ACCESS DEVICE INSERTION: SHX5158

## 2023-11-10 HISTORY — PX: AORTOGRAM: SHX6300

## 2023-11-10 HISTORY — DX: Failed or difficult intubation, initial encounter: T88.4XXA

## 2023-11-10 LAB — TYPE AND SCREEN
ABO/RH(D): O POS
Antibody Screen: NEGATIVE

## 2023-11-10 LAB — POCT I-STAT, CHEM 8
BUN: 40 mg/dL — ABNORMAL HIGH (ref 6–20)
Calcium, Ion: 1.13 mmol/L — ABNORMAL LOW (ref 1.15–1.40)
Chloride: 100 mmol/L (ref 98–111)
Creatinine, Ser: 15.2 mg/dL — ABNORMAL HIGH (ref 0.61–1.24)
Glucose, Bld: 148 mg/dL — ABNORMAL HIGH (ref 70–99)
HCT: 54 % — ABNORMAL HIGH (ref 39.0–52.0)
Hemoglobin: 18.4 g/dL — ABNORMAL HIGH (ref 13.0–17.0)
Potassium: 4.5 mmol/L (ref 3.5–5.1)
Sodium: 136 mmol/L (ref 135–145)
TCO2: 23 mmol/L (ref 22–32)

## 2023-11-10 LAB — GLUCOSE, CAPILLARY
Glucose-Capillary: 162 mg/dL — ABNORMAL HIGH (ref 70–99)
Glucose-Capillary: 213 mg/dL — ABNORMAL HIGH (ref 70–99)

## 2023-11-10 SURGERY — INSERTION, CATHETER, HERO
Anesthesia: General | Site: Neck | Laterality: Right

## 2023-11-10 MED ORDER — INSULIN ASPART 100 UNIT/ML IJ SOLN: 0.0000 [IU] | INTRAMUSCULAR | Status: DC | PRN

## 2023-11-10 MED ORDER — LIDOCAINE 2% (20 MG/ML) 5 ML SYRINGE
INTRAMUSCULAR | Status: AC
  Filled 2023-11-10: qty 5

## 2023-11-10 MED ORDER — ROCURONIUM BROMIDE 100 MG/10ML IV SOLN
INTRAVENOUS | Status: DC | PRN
  Administered 2023-11-10: 60 mg via INTRAVENOUS
  Administered 2023-11-10: 20 mg via INTRAVENOUS
  Administered 2023-11-10: 10 mg via INTRAVENOUS

## 2023-11-10 MED ORDER — CHLORHEXIDINE GLUCONATE 0.12 % MT SOLN
15.0000 mL | Freq: Once | OROMUCOSAL | Status: AC
  Administered 2023-11-10: 15 mL via OROMUCOSAL
  Filled 2023-11-10: qty 15

## 2023-11-10 MED ORDER — PHENYLEPHRINE 80 MCG/ML (10ML) SYRINGE FOR IV PUSH (FOR BLOOD PRESSURE SUPPORT)
PREFILLED_SYRINGE | INTRAVENOUS | Status: DC | PRN
  Administered 2023-11-10: 80 ug via INTRAVENOUS
  Administered 2023-11-10: 160 ug via INTRAVENOUS

## 2023-11-10 MED ORDER — SODIUM CHLORIDE 0.9% FLUSH: 3.0000 mL | INTRAVENOUS | Status: DC | PRN

## 2023-11-10 MED ORDER — MIDODRINE HCL 5 MG PO TABS
5.0000 mg | ORAL_TABLET | Freq: Once | ORAL | Status: AC
  Administered 2023-11-10: 5 mg via ORAL
  Filled 2023-11-10: qty 1

## 2023-11-10 MED ORDER — ALBUMIN HUMAN 5 % IV SOLN: INTRAVENOUS | Status: DC | PRN

## 2023-11-10 MED ORDER — HEPARIN 6000 UNIT IRRIGATION SOLUTION
Status: AC
  Filled 2023-11-10: qty 500

## 2023-11-10 MED ORDER — LIDOCAINE-EPINEPHRINE (PF) 1 %-1:200000 IJ SOLN
INTRAMUSCULAR | Status: AC
  Filled 2023-11-10: qty 30

## 2023-11-10 MED ORDER — SUGAMMADEX SODIUM 200 MG/2ML IV SOLN
INTRAVENOUS | Status: DC | PRN
Start: 2023-11-10 — End: 2023-11-10
  Administered 2023-11-10: 200 mg via INTRAVENOUS

## 2023-11-10 MED ORDER — CEFAZOLIN SODIUM-DEXTROSE 3-4 GM/150ML-% IV SOLN
3.0000 g | INTRAVENOUS | Status: AC
  Administered 2023-11-10: 3 g via INTRAVENOUS
  Filled 2023-11-10: qty 150

## 2023-11-10 MED ORDER — FENTANYL CITRATE (PF) 250 MCG/5ML IJ SOLN
INTRAMUSCULAR | Status: AC
  Filled 2023-11-10: qty 5

## 2023-11-10 MED ORDER — SODIUM CHLORIDE 0.9 % IV SOLN: INTRAVENOUS | Status: DC

## 2023-11-10 MED ORDER — VASOPRESSIN 20 UNIT/ML IV SOLN
INTRAVENOUS | Status: DC | PRN
  Administered 2023-11-10 (×3): 2 [IU] via INTRAVENOUS

## 2023-11-10 MED ORDER — OXYCODONE-ACETAMINOPHEN 5-325 MG PO TABS
1.0000 | ORAL_TABLET | Freq: Four times a day (QID) | ORAL | 0 refills | Status: DC | PRN
  Filled 2023-11-10: qty 20, 5d supply, fill #0

## 2023-11-10 MED ORDER — PROPOFOL 10 MG/ML IV BOLUS
INTRAVENOUS | Status: AC
  Filled 2023-11-10: qty 20

## 2023-11-10 MED ORDER — ORAL CARE MOUTH RINSE
15.0000 mL | Freq: Once | OROMUCOSAL | Status: AC
Start: 2023-11-10 — End: 2023-11-10

## 2023-11-10 MED ORDER — FENTANYL CITRATE (PF) 250 MCG/5ML IJ SOLN
INTRAMUSCULAR | Status: DC | PRN
  Administered 2023-11-10: 25 ug via INTRAVENOUS
  Administered 2023-11-10: 50 ug via INTRAVENOUS

## 2023-11-10 MED ORDER — HEPARIN SODIUM (PORCINE) 1000 UNIT/ML IJ SOLN
INTRAMUSCULAR | Status: DC | PRN
  Administered 2023-11-10: 5700 [IU]

## 2023-11-10 MED ORDER — PROPOFOL 10 MG/ML IV BOLUS
INTRAVENOUS | Status: AC
Start: 2023-11-10 — End: 2023-11-10
  Filled 2023-11-10: qty 20

## 2023-11-10 MED ORDER — OXYCODONE HCL 5 MG/5ML PO SOLN: 5.0000 mg | Freq: Once | ORAL | Status: AC | PRN

## 2023-11-10 MED ORDER — CHLORHEXIDINE GLUCONATE 4 % EX SOLN: 60.0000 mL | Freq: Once | CUTANEOUS | Status: DC

## 2023-11-10 MED ORDER — HEPARIN SODIUM (PORCINE) 1000 UNIT/ML IJ SOLN
INTRAMUSCULAR | Status: AC
  Filled 2023-11-10: qty 10

## 2023-11-10 MED ORDER — VASOPRESSIN 20 UNIT/ML IV SOLN
INTRAVENOUS | Status: AC
  Filled 2023-11-10: qty 1

## 2023-11-10 MED ORDER — ONDANSETRON HCL 4 MG/2ML IJ SOLN
INTRAMUSCULAR | Status: AC
  Filled 2023-11-10: qty 2

## 2023-11-10 MED ORDER — ONDANSETRON HCL 4 MG/2ML IJ SOLN
INTRAMUSCULAR | Status: DC | PRN
  Administered 2023-11-10: 4 mg via INTRAVENOUS

## 2023-11-10 MED ORDER — VASOPRESSIN 20 UNITS/100 ML INFUSION FOR SHOCK
INTRAVENOUS | Status: DC | PRN
  Administered 2023-11-10: .03 [IU]/min via INTRAVENOUS

## 2023-11-10 MED ORDER — OXYCODONE HCL 5 MG PO TABS
ORAL_TABLET | ORAL | Status: AC
  Filled 2023-11-10: qty 1

## 2023-11-10 MED ORDER — MIDAZOLAM HCL 2 MG/2ML IJ SOLN
INTRAMUSCULAR | Status: DC | PRN
  Administered 2023-11-10: 2 mg via INTRAVENOUS

## 2023-11-10 MED ORDER — NOREPINEPHRINE 4 MG/250ML-% IV SOLN
INTRAVENOUS | Status: DC | PRN
  Administered 2023-11-10: 3 ug/min via INTRAVENOUS

## 2023-11-10 MED ORDER — PHENYLEPHRINE HCL-NACL 20-0.9 MG/250ML-% IV SOLN
INTRAVENOUS | Status: DC | PRN
  Administered 2023-11-10: 40 ug/min via INTRAVENOUS

## 2023-11-10 MED ORDER — HEPARIN 6000 UNIT IRRIGATION SOLUTION
Status: DC | PRN
Start: 2023-11-10 — End: 2023-11-10
  Administered 2023-11-10: 1

## 2023-11-10 MED ORDER — LIDOCAINE 2% (20 MG/ML) 5 ML SYRINGE
INTRAMUSCULAR | Status: DC | PRN
  Administered 2023-11-10: 60 mg via INTRAVENOUS

## 2023-11-10 MED ORDER — OXYCODONE HCL 5 MG PO TABS
5.0000 mg | ORAL_TABLET | Freq: Once | ORAL | Status: AC | PRN
  Administered 2023-11-10: 5 mg via ORAL

## 2023-11-10 MED ORDER — PROPOFOL 10 MG/ML IV BOLUS
INTRAVENOUS | Status: DC | PRN
  Administered 2023-11-10 (×2): 20 mg via INTRAVENOUS
  Administered 2023-11-10: 40 mg via INTRAVENOUS
  Administered 2023-11-10: 120 mg via INTRAVENOUS

## 2023-11-10 MED ORDER — SODIUM CHLORIDE 0.9 % IV SOLN: INTRAVENOUS | Status: DC | PRN

## 2023-11-10 MED ORDER — MIDAZOLAM HCL 2 MG/2ML IJ SOLN
INTRAMUSCULAR | Status: AC
  Filled 2023-11-10: qty 2

## 2023-11-10 MED ORDER — SODIUM CHLORIDE 0.9 % IV SOLN
12.5000 mg | INTRAVENOUS | Status: DC | PRN
  Filled 2023-11-10: qty 0.5

## 2023-11-10 MED ORDER — AMISULPRIDE (ANTIEMETIC) 5 MG/2ML IV SOLN
10.0000 mg | Freq: Once | INTRAVENOUS | Status: AC | PRN
Start: 2023-11-10 — End: 2023-11-10
  Administered 2023-11-10: 10 mg via INTRAVENOUS

## 2023-11-10 MED ORDER — FENTANYL CITRATE (PF) 100 MCG/2ML IJ SOLN
25.0000 ug | INTRAMUSCULAR | Status: DC | PRN
  Administered 2023-11-10: 50 ug via INTRAVENOUS

## 2023-11-10 MED ORDER — AMISULPRIDE (ANTIEMETIC) 5 MG/2ML IV SOLN
INTRAVENOUS | Status: AC
  Filled 2023-11-10: qty 4

## 2023-11-10 MED ORDER — ACETAMINOPHEN 500 MG PO TABS
1000.0000 mg | ORAL_TABLET | Freq: Once | ORAL | Status: AC
  Administered 2023-11-10: 1000 mg via ORAL
  Filled 2023-11-10: qty 2

## 2023-11-10 MED ORDER — FENTANYL CITRATE (PF) 100 MCG/2ML IJ SOLN
INTRAMUSCULAR | Status: AC
Start: 2023-11-10 — End: 2023-11-10
  Filled 2023-11-10: qty 2

## 2023-11-10 MED ORDER — 0.9 % SODIUM CHLORIDE (POUR BTL) OPTIME
TOPICAL | Status: DC | PRN
  Administered 2023-11-10: 1000 mL

## 2023-11-10 MED ORDER — IODIXANOL 320 MG/ML IV SOLN
INTRAVENOUS | Status: DC | PRN
  Administered 2023-11-10: 30 mL via INTRAVENOUS

## 2023-11-10 SURGICAL SUPPLY — 53 items
ARMBAND PINK RESTRICT EXTREMIT (MISCELLANEOUS) ×3 IMPLANT
BAG COUNTER SPONGE SURGICOUNT (BAG) ×3 IMPLANT
BALLOON MUSTANG 8X60X75 (BALLOONS) IMPLANT
BIOPATCH RED 1 DISK 7.0 (GAUZE/BANDAGES/DRESSINGS) IMPLANT
CANISTER SUCTION 3000ML PPV (SUCTIONS) ×3 IMPLANT
CANNULA VESSEL 3MM 2 BLNT TIP (CANNULA) IMPLANT
CATH BEACON 5 .035 65 KMP TIP (CATHETERS) IMPLANT
CATH PALINDROME-P 23 W/VT (CATHETERS) IMPLANT
CLIP LIGATING EXTRA MED SLVR (CLIP) ×3 IMPLANT
CLIP LIGATING EXTRA SM BLUE (MISCELLANEOUS) ×3 IMPLANT
COMPONENT HERO ACCESSORY KIT (VASCULAR PRODUCTS) IMPLANT
COMPONENT HERO ARTERIAL GRAFT (Vascular Products) IMPLANT
COMPONENT HERO VENOUS OVERFLOW (Vascular Products) IMPLANT
COVER PROBE W GEL 5X96 (DRAPES) ×3 IMPLANT
DERMABOND ADVANCED .7 DNX12 (GAUZE/BANDAGES/DRESSINGS) ×3 IMPLANT
DERMABOND ADVANCED .7 DNX6 (GAUZE/BANDAGES/DRESSINGS) IMPLANT
DEVICE TORQUE SEADRAGON GRN (MISCELLANEOUS) IMPLANT
DRAPE C-ARM 42X72 X-RAY (DRAPES) ×3 IMPLANT
DRAPE CHEST BREAST 15X10 FENES (DRAPES) IMPLANT
DRAPE SURG ORHT 6 SPLT 77X108 (DRAPES) IMPLANT
ELECTRODE REM PT RTRN 9FT ADLT (ELECTROSURGICAL) ×3 IMPLANT
GLIDEWIRE ADV .035X180CM (WIRE) IMPLANT
GLOVE BIO SURGEON STRL SZ7.5 (GLOVE) ×3 IMPLANT
GOWN STRL REUS W/ TWL LRG LVL3 (GOWN DISPOSABLE) ×6 IMPLANT
GOWN STRL REUS W/ TWL XL LVL3 (GOWN DISPOSABLE) ×3 IMPLANT
GUIDEWIRE AMPLATZ STIFF 0.35 (WIRE) IMPLANT
GUIDEWIRE ANGLED .035X150CM (WIRE) IMPLANT
KIT BASIN OR (CUSTOM PROCEDURE TRAY) ×3 IMPLANT
KIT ENCORE 26 ADVANTAGE (KITS) IMPLANT
KIT TURNOVER KIT B (KITS) ×3 IMPLANT
MAT PREVALON FULL STRYKER (MISCELLANEOUS) IMPLANT
NDL PERC 18GX7CM (NEEDLE) ×3 IMPLANT
NEEDLE PERC 18GX7CM (NEEDLE) ×3 IMPLANT
NS IRRIG 1000ML POUR BTL (IV SOLUTION) ×3 IMPLANT
PACK CV ACCESS (CUSTOM PROCEDURE TRAY) ×3 IMPLANT
PAD ARMBOARD POSITIONER FOAM (MISCELLANEOUS) ×6 IMPLANT
POWDER SURGICEL 3.0 GRAM (HEMOSTASIS) IMPLANT
SET MICROPUNCTURE 5F STIFF (MISCELLANEOUS) IMPLANT
SHEATH PINNACLE 8F 10CM (SHEATH) IMPLANT
SUT ETHILON 3 0 PS 1 (SUTURE) IMPLANT
SUT MNCRL AB 4-0 PS2 18 (SUTURE) ×3 IMPLANT
SUT PROLENE 6 0 BV (SUTURE) ×3 IMPLANT
SUT VIC AB 3-0 SH 27X BRD (SUTURE) ×3 IMPLANT
SYR 30ML LL (SYRINGE) IMPLANT
SYR 5ML LL (SYRINGE) ×3 IMPLANT
TOWEL GREEN STERILE (TOWEL DISPOSABLE) ×3 IMPLANT
UNDERPAD 30X36 HEAVY ABSORB (UNDERPADS AND DIAPERS) ×3 IMPLANT
WATER STERILE IRR 1000ML POUR (IV SOLUTION) ×3 IMPLANT
WIRE AMPLATZ SS-J .035X180CM (WIRE) IMPLANT
WIRE BENTSON .035X145CM (WIRE) IMPLANT
WIRE J 3MM .035X145CM (WIRE) ×3 IMPLANT
WIRE ROSEN 145CM (WIRE) IMPLANT
WIRE TORQFLEX AUST .018X40CM (WIRE) IMPLANT

## 2023-11-10 NOTE — Anesthesia Procedure Notes (Signed)
 Procedure Name: Intubation Date/Time: 11/10/2023 8:18 AM  Performed by: Gabe Jock, CRNAPre-anesthesia Checklist: Patient identified, Emergency Drugs available, Suction available and Patient being monitored Patient Re-evaluated:Patient Re-evaluated prior to induction Oxygen Delivery Method: Circle System Utilized Preoxygenation: Pre-oxygenation with 100% oxygen Induction Type: IV induction Ventilation: Mask ventilation without difficulty Laryngoscope Size: Glidescope and 4 Grade View: Grade I Tube type: Oral Tube size: 8.0 mm Number of attempts: 1 Airway Equipment and Method: Stylet Placement Confirmation: ETT inserted through vocal cords under direct vision, positive ETCO2 and breath sounds checked- equal and bilateral Secured at: 24 cm Tube secured with: Tape Dental Injury: Teeth and Oropharynx as per pre-operative assessment

## 2023-11-10 NOTE — Progress Notes (Signed)
 Fentanyl  50mcq iv wasted in stericycle.Witnessed by [employer] RN [facility].

## 2023-11-10 NOTE — Discharge Instructions (Signed)

## 2023-11-10 NOTE — Anesthesia Procedure Notes (Signed)
 Procedure Name: LMA Insertion Date/Time: 11/10/2023 7:52 AM  Performed by: Gabe Jock, CRNAPre-anesthesia Checklist: Patient identified, Emergency Drugs available, Suction available and Patient being monitored Patient Re-evaluated:Patient Re-evaluated prior to induction Oxygen Delivery Method: Circle System Utilized Preoxygenation: Pre-oxygenation with 100% oxygen Induction Type: IV induction Ventilation: Mask ventilation without difficulty LMA: LMA inserted LMA Size: 5.0 Number of attempts: 1 Placement Confirmation: positive ETCO2 Tube secured with: Tape Dental Injury: Teeth and Oropharynx as per pre-operative assessment

## 2023-11-10 NOTE — Op Note (Signed)
 Patient name: Paul [name] MRN: [id number] DOB: 1990-08-08 Sex: male  11/10/2023 Pre-operative Diagnosis: End-stage renal disease, need for permanent dialysis access Post-operative diagnosis:  Same Surgeon:  Sarajane Cumming. Vikki Graves, MD Assistant: Naida Austria, PA Procedure Performed: 1.  Placement of left-sided 23 cm tunneled dialysis catheter via left external jugular collateral vein using ultrasound and fluoroscopic guidance 2.  Central venogram with 8 mm balloon venoplasty of right innominate and subclavian veins 3.  Placement of right upper extremity brachial artery based hemodialysis with reliable outflow graft   Indications: 33 year old male with multiple previous upper extremity accesses most recently a right upper extremity graft which was infected and was subsequently excised.  He is now on dialysis via right IJ tunneled dialysis catheter.  He has very few options for permanent access he is discussed proceeding with hemodialysis reliable outflow graft.  I discussed with him that if the graft is able to be placed in the right IJ we will likely need to remove the tunneled dialysis catheter and replaced this on the left and patient is adamant to not have a femoral tunneled dialysis catheter.  We also discussed that if he has clotting issues with the graft that will need to be removed.  We discussed that he is at increased risk of infection relative to most patients given that he recently had an infected graft.  Patient demonstrates very good understanding of all of this but is willing to proceed and consent was signed.  Experience assistant was necessary due to the difficulty of this procedure and the bilateral nature of the endovascular placement of the tunneled dialysis catheter and balloon venoplasty of the right innominate and subclavian veins concomitantly.  The assistant was also necessary to facilitate very difficult following of the hero graft and performing anastomosis to the brachial  artery.  Findings: The existing catheter on the right side was occlusive to the right subclavian and innominate veins.  There were no named neck veins for cannulation but there was a branch of from the external jugular vein which I was able to cannulate and perform venogram and this was noted to directly flow to the right subclavian and innominate veins.  I initially attempted to place the catheter by getting a wire around the existing catheter but this was occlusive.  We then removed the catheter over a wire perform balloon venoplasty of the right innominate and subclavian veins and were able to get a stiff wire into the right atrium and into the IVC from the left and vein at 23 cm catheter was placed from the external jugular collateral vein into the SVC and atrial junction.  We were then able to place the venous outflow catheter portion of the hero graft and this was noted to be in place and neither of the catheters were kinked both did flush and withdrawal blood easily.  The brachial artery was approximately 3 mm was free of disease and this was used as the inflow for the hero graft.  At completion there was very strong flow in the hero graft and the tunneled dialysis catheter did flush and withdraw easily and was locked with 1.9 cc of concentrated heparin  in both ports.  The radial and ulnar signals at the right wrist were dopplerable although not palpable and did augment with compression of the graft.   Procedure:  The patient was identified in the holding area and taken to the operating room where he was placed supine operative table and initially LMA anesthesia was induced  and ultimately he was transitioned to general anesthesia due to hypotension.  He was then sterilely prepped in the bilateral neck and chest and right upper extremity and a timeout was called.  I began using ultrasound and identified a collateral vein in the neck on the left side which had previously identified and this was cannulated  with a micropuncture needle followed by wire and a sheath and central venography was performed which demonstrated direct flow into the deep system.  It also demonstrated that the left innominate and left subclavian appeared patent but these were very deep for cannulation and there were no large veins that I could cannulate to connect to the system.  I was ultimately able to get a Glidewire down around into the right subclavian vein and then placed a 5 French sheath.  Attempted to use a Kumpe catheter to get the wire around the existing tunneled dialysis catheter but this would not work.  For that reason I cut down on the existing tunneled dialysis catheter in the right neck and transected this catheter ultimately this entire catheter was removed by dissecting away the cuff.  I placed a wire through the catheter down into the IVC under fluoroscopic guidance this was initially a Rosen wire and ultimately transition to an Amplatz wire.  I placed an 8 [place] sheath.  We then performed 8 mm balloon angioplasty of the right innominate and subclavian veins.  After this I was able to get a Glidewire from the left side down centrally and ultimately placed a Rosen wire into the IVC.  We then serially dilated the wire tract and placed the introducer sheath.  I made a counterincision and tunneled a 23 cm tunneled dialysis catheter and then placed this centrally next to the Amplatz wire and the wire was removed and the base catheter did flush and withdraw easily.  This catheter was then used by anesthesia as an additional port and the other port was locked with concentrated heparin .  I then turned my attention to the right side and were able to get the venous outflow catheter down using fluoroscopic guidance after placing a 12 French dilator and this was placed into the mid right atrium.  I made a counterincision near the deltopectoral groove on the right which is approximately 3 cm in length and I tunneled the venous outflow  catheter portion of the hero graft retrograde to this level and transected it.  We then performed a standard brachial artery incision below the antecubitum and dissected down to the brachial artery encircled this with vessel loop.  The artery was noted to be free of disease and was 3 mm in diameter.  We then tunneled the graft laterally from the brachial artery incision to the incision at the deltopectoral groove.  The venous outflow catheter was then trimmed to size and assembled to the graft.  The graft was then pulled taut and this was done under fluoroscopic guidance maintaining the orientation of the venous outflow catheter in the right atrium.  I then flushed the graft with heparinized saline and the V OC portion was then reclamped.  The brachial artery was clamped this and proximally opened longitudinally flushed heparinized saline in both directions.  The graft was trimmed to size and sewn end-to-side with 6-0 Prolene suture.  Prior to completion we allowed flushing and backbleeding from the catheter portion as well as the proximal distal brachial artery.  At completion there was a very strong flow palpable through the graft and dopplerable  flow in the VOC portion of the catheter.  By fluoroscopic guidance both the hero venous outflow catheter and TDC both sitting through the right innominate vein and there was no kinking.  Satisfied with this we irrigated all the wounds.  They were all then closed with Vicryl Monocryl and Dermabond placed at the skin level.  The thumb dialysis catheter was flushed again and locked with 1.9 cc of concentrated heparin  and both ports after anesthesia was 3 using the balloon for.  Sterile dressing was applied.  He was awakened from anesthesia having tolerated the procedure without any complication.  All counts were correct at completion.  EBL: 200cc  Contrast: 30cc   Aniyha Tate C. Vikki Graves, MD [facility] Office: [phone] Pager:  [phone]

## 2023-11-10 NOTE — Transfer of Care (Signed)
 Immediate Anesthesia Transfer of Care Note  Patient: Paul [name]  Procedure(s) Performed: INSERTION, CATHETER, HERO (Right: Neck) INSERTION OF DIALYSIS CATHETER (Left: Chest) central venogram  Patient Location: PACU  Anesthesia Type:General  Level of Consciousness: drowsy  Airway & Oxygen Therapy: Patient Spontanous Breathing and Patient connected to face mask oxygen  Post-op Assessment: Report given to RN and Post -op Vital signs reviewed and stable  Post vital signs: Reviewed and stable  Last Vitals:  Vitals Value Taken Time  BP 111/77 11/10/23 11:00  Temp 98.5   Pulse 99 11/10/23 11:03  Resp 26 11/10/23 11:03  SpO2 100 % 11/10/23 11:03  Vitals shown include unfiled device data.  Last Pain:  Vitals:   11/10/23 0613  TempSrc:   PainSc: 0-No pain         Complications: No notable events documented.

## 2023-11-10 NOTE — Anesthesia Postprocedure Evaluation (Signed)
 Anesthesia Post Note  Patient: Errin Chewning  Procedure(s) Performed: INSERTION, CATHETER, HERO (Right: Neck) INSERTION OF DIALYSIS CATHETER (Left: Chest) central venogram     Patient location during evaluation: PACU Anesthesia Type: General Level of consciousness: awake and alert Pain management: pain level controlled Vital Signs Assessment: post-procedure vital signs reviewed and stable Respiratory status: spontaneous breathing, nonlabored ventilation and respiratory function stable Cardiovascular status: stable and blood pressure returned to baseline Anesthetic complications: no   No notable events documented.  Last Vitals:  Vitals:   11/10/23 1130 11/10/23 1145  BP: 96/67 (!) 99/54  Pulse: (!) 101 (!) 101  Resp: 13 17  Temp:    SpO2: 96% 97%                    Juventino Oppenheim

## 2023-11-10 NOTE — Interval H&P Note (Signed)
 History and Physical Interval Note:  11/10/2023 7:38 AM  Paul [name]  has presented today for surgery, with the diagnosis of ESRD.  The various methods of treatment have been discussed with the patient and family. After consideration of risks, benefits and other options for treatment, the patient has consented to  Procedure(s): INSERTION, CATHETER, HERO (Right) as a surgical intervention.   I have had a very long discussion with the patient.  I am not sure that the hero catheter is going to function given his lack of inflow and also we discussed possible need to remove his tunneled dialysis catheter.  Patient demonstrates very good understanding of this but wishes to proceed.  We discussed the risks of infection and the need for alternative arterial access given his still healing right upper extremity incision.  We also discussed that if hero catheter has clotting issues that it must be removed.  He demonstrates very good understanding of this and agrees to proceed.    The patient's history has been reviewed, patient examined, no change in status, stable for surgery.  I have reviewed the patient's chart and labs.  Questions were answered to the patient's satisfaction.     Angela Kell

## 2023-11-12 ENCOUNTER — Other Ambulatory Visit: Payer: Self-pay | Admitting: Student

## 2023-11-13 ENCOUNTER — Encounter (HOSPITAL_COMMUNITY): Payer: Self-pay | Admitting: Vascular Surgery

## 2023-11-15 ENCOUNTER — Other Ambulatory Visit: Payer: Self-pay | Admitting: Student

## 2023-11-15 DIAGNOSIS — Z794 Long term (current) use of insulin: Secondary | ICD-10-CM

## 2023-11-16 ENCOUNTER — Other Ambulatory Visit: Payer: Self-pay | Admitting: Physician Assistant

## 2023-11-16 ENCOUNTER — Telehealth: Payer: Self-pay

## 2023-11-16 MED ORDER — OXYCODONE-ACETAMINOPHEN 5-325 MG PO TABS: 1.0000 | ORAL_TABLET | Freq: Four times a day (QID) | ORAL | 0 refills | Status: DC | PRN

## 2023-11-16 NOTE — Progress Notes (Signed)
 Mr. Delbene underwent left  IJ [place] placement as well as right arm hero graft placement on 11/10/2023 by Dr. Sheree.  He was provided 5 days of pain medication postoperatively which he has finished.  He will be prescribed an additional number 20 tablets of 5/325 mg Percocet.  He was made aware that this will be his last prescription for pain medicine.  He has a follow-up with us  in the office in a couple weeks.  Donnice Sender, PA-C [facility] [phone] 11/16/2023  11:01 AM

## 2023-11-16 NOTE — Telephone Encounter (Signed)
 Pt called [facility] triage line asking for a refill of Oxycodone  for his arm pain post surgery.  Adina Sender, PA sent a refill in for pt.    Pt notified of refill.

## 2023-11-17 DIAGNOSIS — N186 End stage renal disease: Secondary | ICD-10-CM | POA: Diagnosis not present

## 2023-11-20 ENCOUNTER — Telehealth: Payer: Self-pay

## 2023-11-20 DIAGNOSIS — N186 End stage renal disease: Secondary | ICD-10-CM | POA: Diagnosis not present

## 2023-11-20 DIAGNOSIS — E1129 Type 2 diabetes mellitus with other diabetic kidney complication: Secondary | ICD-10-CM | POA: Diagnosis not present

## 2023-11-20 DIAGNOSIS — Z992 Dependence on renal dialysis: Secondary | ICD-10-CM | POA: Diagnosis not present

## 2023-11-20 NOTE — Telephone Encounter (Signed)
 Paul [name], pt's sister, called [facility] Triage concerned that the Hero graft placed 11/10/23 is not working.     Pt reports that he no longer feels the graft.  It hasn't been accessed yet.    Brittny wants to know if the graft has to be removed if it doesn't work.   Brittny requesting a call from Dr. Sheree to discuss.  [phone]   Staff message sent to Dr. Sheree, awaiting reply.  Scheduled appt with Dr. Sheree, 11/29/23 to discuss steps forward.

## 2023-11-27 ENCOUNTER — Other Ambulatory Visit: Payer: Self-pay | Admitting: Vascular Surgery

## 2023-11-27 DIAGNOSIS — I739 Peripheral vascular disease, unspecified: Secondary | ICD-10-CM

## 2023-11-29 ENCOUNTER — Ambulatory Visit: Attending: Vascular Surgery | Admitting: Vascular Surgery

## 2023-11-29 ENCOUNTER — Encounter: Payer: Self-pay | Admitting: Vascular Surgery

## 2023-11-29 VITALS — BP 123/85 | HR 96 | Temp 97.9°F | Ht 74.0 in | Wt 363.0 lb

## 2023-11-29 DIAGNOSIS — N186 End stage renal disease: Secondary | ICD-10-CM

## 2023-11-29 NOTE — Progress Notes (Signed)
     Subjective:     Patient ID: Paul [name], male   DOB: 11-05-1990, 33 y.o.   MRN: [id number]  HPI 33 year old male on dialysis via tunneled catheter follows up with concerns for thrombosis of his hero graft.  He states that he has not been able to feel it for several days.  Catheter is working well.   Review of Systems As above    Objective:   Physical Exam Vitals:   11/29/23 0942  BP: 123/85  Pulse: 96  Temp: 97.9 F (36.6 C)  SpO2: 93%   Awake alert and oriented Nonlabored respirations There is a palpable thrill in the graft in the upper arm as well as dopplerable signal both in the upper arm and the chest    Assessment/plan     33 year old male status post placement of right hero graft.  This should be okay to be used next week and after 3 uses should be okay to remove the tunneled catheter which is in the left external jugular vein.        Zahi Plaskett C. Sheree, MD [facility] [place] Office: [phone] Pager: [phone]

## 2023-11-30 ENCOUNTER — Other Ambulatory Visit: Payer: Self-pay

## 2023-11-30 DIAGNOSIS — E1129 Type 2 diabetes mellitus with other diabetic kidney complication: Secondary | ICD-10-CM

## 2023-12-01 ENCOUNTER — Other Ambulatory Visit: Payer: Self-pay

## 2023-12-01 DIAGNOSIS — Z794 Long term (current) use of insulin: Secondary | ICD-10-CM

## 2023-12-01 MED ORDER — LANTUS 100 UNIT/ML ~~LOC~~ SOLN: 5.0000 [IU] | Freq: Two times a day (BID) | SUBCUTANEOUS | 3 refills | Status: DC

## 2023-12-06 ENCOUNTER — Encounter

## 2023-12-08 NOTE — Addendum Note (Signed)
 Addended by: Katryna Tschirhart C on: 12/08/2023 01:59 PM   Modules accepted: Orders

## 2023-12-11 MED ORDER — APIXABAN 5 MG PO TABS: 5.0000 mg | ORAL_TABLET | Freq: Two times a day (BID) | ORAL | 0 refills | Status: DC

## 2023-12-21 ENCOUNTER — Telehealth: Payer: Self-pay

## 2023-12-21 DIAGNOSIS — N186 End stage renal disease: Secondary | ICD-10-CM

## 2023-12-21 NOTE — Progress Notes (Signed)
 Complex Care Management Note Care Guide Note  12/21/2023 Name: Paul [name] MRN: [id number] DOB: 04/07/1991   Complex Care Management Outreach Attempts: An unsuccessful telephone outreach was attempted today to offer the patient information about available complex care management services.  Follow Up Plan:  Additional outreach attempts will be made to offer the patient complex care management information and services.   Encounter Outcome:  No Answer  [facility]  [facility], [facility] Health Care Management Assistant Direct Dial : [phone]  Fax: [phone]

## 2023-12-27 ENCOUNTER — Encounter

## 2023-12-27 ENCOUNTER — Other Ambulatory Visit: Payer: Self-pay

## 2023-12-27 DIAGNOSIS — E039 Hypothyroidism, unspecified: Secondary | ICD-10-CM

## 2023-12-27 DIAGNOSIS — Z7901 Long term (current) use of anticoagulants: Secondary | ICD-10-CM | POA: Insufficient documentation

## 2023-12-28 NOTE — Progress Notes (Signed)
 Complex Care Management Note Care Guide Note  12/28/2023 Name: Paul [name] MRN: [id number] DOB: 07/14/1990   Complex Care Management Outreach Attempts: A second unsuccessful outreach was attempted today to offer the patient with information about available complex care management services.  Follow Up Plan:  Additional outreach attempts will be made to offer the patient complex care management information and services.   Encounter Outcome:  No Answer  Dream[facility]  [facility], [facility] Management Assistant Direct Dial : [phone]  Fax: [phone]

## 2023-12-29 MED ORDER — LEVOTHYROXINE SODIUM 150 MCG PO TABS: 150.0000 ug | ORAL_TABLET | Freq: Every day | ORAL | 0 refills | Status: DC

## 2024-01-01 NOTE — Progress Notes (Signed)
 Complex Care Management Note Care Guide Note  01/01/2024 Name: Paul [name] MRN: [id number] DOB: Jun 20, 1990   Complex Care Management Outreach Attempts: A third unsuccessful outreach was attempted today to offer the patient with information about available complex care management services.  Follow Up Plan:  No further outreach attempts will be made at this time. We have been unable to contact the patient to offer or enroll patient in complex care management services.  Encounter Outcome:  No Answer  [facility]  [facility], [facility] Health Care Management Assistant Direct Dial : [phone]  Fax: [phone]

## 2024-01-03 ENCOUNTER — Ambulatory Visit: Admitting: Family Medicine

## 2024-01-03 ENCOUNTER — Other Ambulatory Visit: Payer: Self-pay

## 2024-01-03 NOTE — Telephone Encounter (Signed)
 Pt calling to get a refill on levofloxacin  150 MCG. Pt has an appt on 01/10/2024 with Dr Tharon for physical and lab work, [employer]

## 2024-01-04 ENCOUNTER — Other Ambulatory Visit: Payer: Self-pay

## 2024-01-05 MED ORDER — APIXABAN 5 MG PO TABS: 5.0000 mg | ORAL_TABLET | Freq: Two times a day (BID) | ORAL | 0 refills | Status: DC

## 2024-01-10 ENCOUNTER — Encounter: Payer: Self-pay | Admitting: Family Medicine

## 2024-01-10 ENCOUNTER — Ambulatory Visit (INDEPENDENT_AMBULATORY_CARE_PROVIDER_SITE_OTHER): Admitting: Family Medicine

## 2024-01-10 ENCOUNTER — Telehealth: Payer: Self-pay

## 2024-01-10 VITALS — BP 124/68 | HR 89 | Ht 74.0 in | Wt 346.6 lb

## 2024-01-10 DIAGNOSIS — E785 Hyperlipidemia, unspecified: Secondary | ICD-10-CM | POA: Diagnosis not present

## 2024-01-10 DIAGNOSIS — Z794 Long term (current) use of insulin: Secondary | ICD-10-CM | POA: Diagnosis not present

## 2024-01-10 DIAGNOSIS — N186 End stage renal disease: Secondary | ICD-10-CM | POA: Diagnosis not present

## 2024-01-10 DIAGNOSIS — E039 Hypothyroidism, unspecified: Secondary | ICD-10-CM

## 2024-01-10 DIAGNOSIS — E1129 Type 2 diabetes mellitus with other diabetic kidney complication: Secondary | ICD-10-CM

## 2024-01-10 DIAGNOSIS — M545 Low back pain, unspecified: Secondary | ICD-10-CM | POA: Diagnosis not present

## 2024-01-10 DIAGNOSIS — G8929 Other chronic pain: Secondary | ICD-10-CM

## 2024-01-10 DIAGNOSIS — Z1159 Encounter for screening for other viral diseases: Secondary | ICD-10-CM

## 2024-01-10 DIAGNOSIS — Z992 Dependence on renal dialysis: Secondary | ICD-10-CM | POA: Diagnosis not present

## 2024-01-10 DIAGNOSIS — N185 Chronic kidney disease, stage 5: Secondary | ICD-10-CM | POA: Diagnosis not present

## 2024-01-10 DIAGNOSIS — I10 Essential (primary) hypertension: Secondary | ICD-10-CM

## 2024-01-10 DIAGNOSIS — D631 Anemia in chronic kidney disease: Secondary | ICD-10-CM

## 2024-01-10 LAB — POCT GLYCOSYLATED HEMOGLOBIN (HGB A1C): HbA1c, POC (controlled diabetic range): 7.4 % — AB (ref 0.0–7.0)

## 2024-01-10 MED ORDER — FERRIC CITRATE 1 GM 210 MG(FE) PO TABS: 420.0000 mg | ORAL_TABLET | ORAL | 0 refills | Status: DC

## 2024-01-10 MED ORDER — CINACALCET HCL 30 MG PO TABS: 30.0000 mg | ORAL_TABLET | Freq: Every day | ORAL | 0 refills | Status: AC

## 2024-01-10 MED ORDER — CYCLOBENZAPRINE HCL 5 MG PO TABS
5.0000 mg | ORAL_TABLET | Freq: Three times a day (TID) | ORAL | 0 refills | Status: AC | PRN
Start: 2024-01-10 — End: ?

## 2024-01-10 MED ORDER — ASPIRIN 81 MG PO TBEC: 81.0000 mg | DELAYED_RELEASE_TABLET | Freq: Every day | ORAL | 0 refills | Status: AC

## 2024-01-10 MED ORDER — GABAPENTIN 100 MG PO CAPS: 100.0000 mg | ORAL_CAPSULE | Freq: Three times a day (TID) | ORAL | 0 refills | Status: DC

## 2024-01-10 MED ORDER — GABAPENTIN 300 MG PO CAPS: 300.0000 mg | ORAL_CAPSULE | Freq: Every day | ORAL | 0 refills | Status: DC

## 2024-01-10 MED ORDER — APIXABAN 5 MG PO TABS: 5.0000 mg | ORAL_TABLET | Freq: Two times a day (BID) | ORAL | 0 refills | Status: DC

## 2024-01-10 MED ORDER — MIDODRINE HCL 5 MG PO TABS: 5.0000 mg | ORAL_TABLET | ORAL | 3 refills | Status: AC

## 2024-01-10 MED ORDER — ROSUVASTATIN CALCIUM 20 MG PO TABS: 20.0000 mg | ORAL_TABLET | Freq: Every day | ORAL | 0 refills | Status: DC

## 2024-01-10 MED ORDER — LANTUS 100 UNIT/ML ~~LOC~~ SOLN: 5.0000 [IU] | Freq: Two times a day (BID) | SUBCUTANEOUS | 3 refills | Status: DC

## 2024-01-10 NOTE — Assessment & Plan Note (Signed)
 A1c stable at 7.4 today.  He is close to goal while on Lantus  5 to 8 units twice daily.  With elevated BMI, he would be a good candidate for a GLP-1.  Will discuss.

## 2024-01-10 NOTE — Telephone Encounter (Signed)
 Pharmacy calls nurse line in regards to (2) prescriptions.   (1) Pharmacy requests clarification on Gabapentin  prescriptions. He asks if patient is taking both 100mg  and 300mg  daily.   (2) For day supply he asks if provider is aware of how many snacks the patient will be consuming per day on Ferric Citrate . He reports typically 2-3 snacks per day, however needs information for insurance purposes.   Advised will forward to PCP.   I can give verbal to pharmacy.

## 2024-01-10 NOTE — Assessment & Plan Note (Signed)
 Controlled today.  He is on midodrine  with dialysis only.  Continue to monitor.

## 2024-01-10 NOTE — Assessment & Plan Note (Signed)
 He has been taking Synthroid  150 mcg daily.  We will update TSH today and adjust as needed.

## 2024-01-10 NOTE — Assessment & Plan Note (Signed)
 We will update CBC today.

## 2024-01-10 NOTE — Progress Notes (Signed)
    SUBJECTIVE:   CHIEF COMPLAINT / HPI:   Check in Doing well. Just needs medications refilled and would like some labs collected for monitoring.  Lower back pain Worsened after sitting for 4 hours at HD.  He does not endorse any issues with walking.  PERTINENT  PMH / PSH: Hypertension, type 2 diabetes, hypothyroidism, ESRD on dialysis Tuesday Thursday Saturday, elevated BMI  OBJECTIVE:   BP 124/68   Pulse 89   Ht 6' 2 (1.88 m)   Wt (!) 346 lb 9.6 oz (157.2 kg)   SpO2 96%   BMI 44.50 kg/m   General: Alert and oriented, in NAD Skin: Warm, dry, and intact HEENT: NCAT, EOM grossly normal, midline nasal septum Respiratory: CTAB, breathing and speaking comfortably on RA Abdominal: Soft, nontender, nondistended, normoactive bowel sounds Extremities: Moves all extremities grossly equally Neurological/MSK: No gross focal deficit, strength and sensation normal in bilateral lower extremities, gait normal, straight leg raise testing negative bilaterally, no tenderness to palpation over midline or paraspinal areas Psychiatric: Appropriate mood and affect   ASSESSMENT/PLAN:   Assessment & Plan Type 2 diabetes mellitus with other diabetic kidney complication, with long-term current use of insulin  (HCC) A1c stable at 7.4 today.  He is close to goal while on Lantus  5 to 8 units twice daily.  With elevated BMI, he would be a good candidate for a GLP-1.  Will discuss. Essential hypertension, benign Controlled today.  He is on midodrine  with dialysis only.  Continue to monitor. ESRD on dialysis [facility]) I have refilled his binders today.  We will update his BMP.  Continue to follow with nephrology and dialysis every Tuesday, Thursday, Saturday. Anemia in stage 5 chronic kidney disease, not on chronic dialysis [facility]) We will update CBC today. Hypothyroidism, unspecified type He has been taking Synthroid  150 mcg daily.  We will update TSH today and adjust as needed. Need for hepatitis C  screening test Screening obtained today. Hyperlipidemia, unspecified hyperlipidemia type We will update lipid panel today.  He continues taking Crestor  20 mg daily. Chronic bilateral low back pain without sciatica Reassuring exam today at the end of the visit.  Likely exacerbated by lying immobile for 4 hours with HD.  After shared decision making, we will try Flexeril  5 mg 3 times daily as needed.  We discussed side effects of sedation and dizziness.  He will let me know how he is doing with this medicine.   Stuart Redo, MD [facility]

## 2024-01-10 NOTE — Assessment & Plan Note (Signed)
 We will update lipid panel today.  He continues taking Crestor  20 mg daily.

## 2024-01-10 NOTE — Assessment & Plan Note (Signed)
 I have refilled his binders today.  We will update his BMP.  Continue to follow with nephrology and dialysis every Tuesday, Thursday, Saturday.

## 2024-01-10 NOTE — Patient Instructions (Addendum)
 I have ordered labs today to evaluate your kidneys, thyroid , and blood counts.  I reordered your medications. Continue to follow with nephrology.  I sent in flexeril . This medicine can cause sedation/dizziness, so be very careful until you know how it affects you.

## 2024-01-11 LAB — BASIC METABOLIC PANEL WITH GFR
BUN/Creatinine Ratio: 3 — ABNORMAL LOW (ref 9–20)
BUN: 41 mg/dL — ABNORMAL HIGH (ref 6–20)
CO2: 23 mmol/L (ref 20–29)
Calcium: 11.4 mg/dL — ABNORMAL HIGH (ref 8.7–10.2)
Chloride: 92 mmol/L — ABNORMAL LOW (ref 96–106)
Creatinine, Ser: 13.53 mg/dL — ABNORMAL HIGH (ref 0.76–1.27)
Glucose: 110 mg/dL — ABNORMAL HIGH (ref 70–99)
Potassium: 5 mmol/L (ref 3.5–5.2)
Sodium: 142 mmol/L (ref 134–144)
eGFR: 5 mL/min/1.73 — ABNORMAL LOW (ref >59)

## 2024-01-11 LAB — LIPID PANEL
Chol/HDL Ratio: 4.5 ratio (ref 0.0–5.0)
Cholesterol, Total: 149 mg/dL (ref 100–199)
HDL: 33 mg/dL — ABNORMAL LOW (ref >39)
LDL Chol Calc (NIH): 76 mg/dL (ref 0–99)
Triglycerides: 245 mg/dL — ABNORMAL HIGH (ref 0–149)
VLDL Cholesterol Cal: 40 mg/dL (ref 5–40)

## 2024-01-11 LAB — HCV INTERPRETATION

## 2024-01-11 LAB — CBC WITH DIFFERENTIAL/PLATELET
Basophils Absolute: 0.1 x10E3/uL (ref 0.0–0.2)
Basos: 1 %
EOS (ABSOLUTE): 0.1 x10E3/uL (ref 0.0–0.4)
Eos: 1 %
Hematocrit: 49.5 % (ref 37.5–51.0)
Hemoglobin: 16.3 g/dL (ref 13.0–17.7)
Immature Grans (Abs): 0 x10E3/uL (ref 0.0–0.1)
Immature Granulocytes: 0 %
Lymphocytes Absolute: 2.8 x10E3/uL (ref 0.7–3.1)
Lymphs: 29 %
MCH: 27.1 pg (ref 26.6–33.0)
MCHC: 32.9 g/dL (ref 31.5–35.7)
MCV: 82 fL (ref 79–97)
Monocytes Absolute: 0.7 x10E3/uL (ref 0.1–0.9)
Monocytes: 8 %
Neutrophils Absolute: 5.7 x10E3/uL (ref 1.4–7.0)
Neutrophils: 61 %
Platelets: 252 x10E3/uL (ref 150–450)
RBC: 6.01 x10E6/uL — ABNORMAL HIGH (ref 4.14–5.80)
RDW: 19.3 % — ABNORMAL HIGH (ref 11.6–15.4)
WBC: 9.4 x10E3/uL (ref 3.4–10.8)

## 2024-01-11 LAB — T4F: T4,Free (Direct): 1.25 ng/dL (ref 0.82–1.77)

## 2024-01-11 LAB — HCV AB W REFLEX TO QUANT PCR: HCV Ab: NONREACTIVE

## 2024-01-11 LAB — TSH RFX ON ABNORMAL TO FREE T4: TSH: 6.76 u[IU]/mL — ABNORMAL HIGH (ref 0.450–4.500)

## 2024-01-11 MED ORDER — GABAPENTIN 100 MG PO CAPS: 100.0000 mg | ORAL_CAPSULE | Freq: Two times a day (BID) | ORAL | 0 refills | Status: DC

## 2024-01-11 NOTE — Telephone Encounter (Signed)
 Appreciate pharmacy input.  I have canceled the daily gabapentin  prescription (prescribed initially as 3 times a day and then prescribed this 2 times a day).  We will keep his prescription at 300 mg at nighttime given his renal dysfunction.  He can follow-up with us  if his pain is not controlled from this perspective.  I am not aware of how many snacks the patient consumes on ferric citrate .  I would defer further discussion of this medication to his nephrologist.  Please relay to pharmacy.  Thank so much.

## 2024-01-12 ENCOUNTER — Ambulatory Visit: Payer: Self-pay | Admitting: Family Medicine

## 2024-01-12 NOTE — Progress Notes (Signed)
 A1c stable at 7.4 while on Lantus  5 to 8 units daily.  With elevated BMI, patient is likely good candidate for GLP-1.  Paul [name], how do you feel about trying Mounjaro?  Can hopefully get off of insulin  if he is stable on this at next A1c recheck.  BMP as expected for his degree of ESRD.  He continues to take his calcium  binders and has hemodialysis Tuesday Thursday Saturday.  Continue to follow with nephrology.  RDW is persistently high on his CBC.  He is without anemia or MCV abnormalities.  Can consider future testing for vitamin deficiencies as indicated.  LDL near goal for him.  Continue Crestor , discussion surrounding GLP-1, and lifestyle modifications.  TSH elevated though T4 is normal.  He is subclinically hypothyroid.  Continue to monitor.  Hepatitis C negative.

## 2024-01-21 DIAGNOSIS — E1129 Type 2 diabetes mellitus with other diabetic kidney complication: Secondary | ICD-10-CM | POA: Diagnosis not present

## 2024-01-21 DIAGNOSIS — N186 End stage renal disease: Secondary | ICD-10-CM | POA: Diagnosis not present

## 2024-01-21 DIAGNOSIS — Z992 Dependence on renal dialysis: Secondary | ICD-10-CM | POA: Diagnosis not present

## 2024-01-24 ENCOUNTER — Other Ambulatory Visit: Payer: Self-pay

## 2024-01-25 MED ORDER — FERRIC CITRATE 1 GM 210 MG(FE) PO TABS: 420.0000 mg | ORAL_TABLET | ORAL | 0 refills | Status: AC

## 2024-01-25 NOTE — Telephone Encounter (Signed)
 Confirmed with patient.   Patient reports ~ 2 snacks per day.   Pharmacy has been updated.   Advised on 300mg  Gabapentin  nightly.

## 2024-01-29 ENCOUNTER — Other Ambulatory Visit: Payer: Self-pay

## 2024-01-29 DIAGNOSIS — E039 Hypothyroidism, unspecified: Secondary | ICD-10-CM

## 2024-02-19 ENCOUNTER — Other Ambulatory Visit: Payer: Self-pay

## 2024-02-19 DIAGNOSIS — E039 Hypothyroidism, unspecified: Secondary | ICD-10-CM

## 2024-02-20 ENCOUNTER — Other Ambulatory Visit (HOSPITAL_COMMUNITY): Payer: Self-pay | Admitting: Internal Medicine

## 2024-02-20 ENCOUNTER — Other Ambulatory Visit (HOSPITAL_COMMUNITY): Payer: Self-pay | Admitting: Student

## 2024-02-20 DIAGNOSIS — N186 End stage renal disease: Secondary | ICD-10-CM | POA: Diagnosis not present

## 2024-02-20 DIAGNOSIS — Z992 Dependence on renal dialysis: Secondary | ICD-10-CM | POA: Diagnosis not present

## 2024-02-20 DIAGNOSIS — E1129 Type 2 diabetes mellitus with other diabetic kidney complication: Secondary | ICD-10-CM | POA: Diagnosis not present

## 2024-02-21 ENCOUNTER — Other Ambulatory Visit (HOSPITAL_COMMUNITY): Payer: Self-pay | Admitting: Internal Medicine

## 2024-02-21 ENCOUNTER — Other Ambulatory Visit: Payer: Self-pay | Admitting: Family Medicine

## 2024-02-21 ENCOUNTER — Ambulatory Visit (HOSPITAL_COMMUNITY)
Admission: RE | Admit: 2024-02-21 | Discharge: 2024-02-21 | Disposition: A | Discharge status: Home / Self Care | Source: Ambulatory Visit | Attending: Internal Medicine | Admitting: Internal Medicine

## 2024-02-21 ENCOUNTER — Other Ambulatory Visit: Payer: Self-pay

## 2024-02-21 ENCOUNTER — Encounter (HOSPITAL_COMMUNITY): Payer: Self-pay

## 2024-02-21 DIAGNOSIS — Z992 Dependence on renal dialysis: Secondary | ICD-10-CM | POA: Insufficient documentation

## 2024-02-21 DIAGNOSIS — N186 End stage renal disease: Secondary | ICD-10-CM | POA: Insufficient documentation

## 2024-02-21 DIAGNOSIS — I120 Hypertensive chronic kidney disease with stage 5 chronic kidney disease or end stage renal disease: Secondary | ICD-10-CM | POA: Insufficient documentation

## 2024-02-21 DIAGNOSIS — Z79899 Other long term (current) drug therapy: Secondary | ICD-10-CM | POA: Diagnosis not present

## 2024-02-21 DIAGNOSIS — T82868A Thrombosis of vascular prosthetic devices, implants and grafts, initial encounter: Secondary | ICD-10-CM | POA: Insufficient documentation

## 2024-02-21 DIAGNOSIS — Z794 Long term (current) use of insulin: Secondary | ICD-10-CM | POA: Diagnosis not present

## 2024-02-21 DIAGNOSIS — E1122 Type 2 diabetes mellitus with diabetic chronic kidney disease: Secondary | ICD-10-CM | POA: Diagnosis not present

## 2024-02-21 DIAGNOSIS — Y832 Surgical operation with anastomosis, bypass or graft as the cause of abnormal reaction of the patient, or of later complication, without mention of misadventure at the time of the procedure: Secondary | ICD-10-CM | POA: Diagnosis not present

## 2024-02-21 DIAGNOSIS — E279 Disorder of adrenal gland, unspecified: Secondary | ICD-10-CM | POA: Diagnosis not present

## 2024-02-21 HISTORY — PX: IR US GUIDE VASC ACCESS RIGHT: IMG2390

## 2024-02-21 HISTORY — PX: IR THROMBECTOMY AV FISTULA W/THROMBOLYSIS/PTA INC/SHUNT/IMG RIGHT: IMG6119

## 2024-02-21 LAB — GLUCOSE, CAPILLARY: Glucose-Capillary: 115 mg/dL — ABNORMAL HIGH (ref 70–99)

## 2024-02-21 MED ORDER — LIDOCAINE HCL 1 % IJ SOLN
INTRAMUSCULAR | Status: AC
  Filled 2024-02-21: qty 20

## 2024-02-21 MED ORDER — ALTEPLASE 2 MG IJ SOLR
INTRAMUSCULAR | Status: AC | PRN
  Administered 2024-02-21 (×2): 2 mg

## 2024-02-21 MED ORDER — FENTANYL CITRATE (PF) 100 MCG/2ML IJ SOLN
INTRAMUSCULAR | Status: AC | PRN
  Administered 2024-02-21 (×4): 25 ug via INTRAVENOUS

## 2024-02-21 MED ORDER — MIDAZOLAM HCL 2 MG/2ML IJ SOLN
INTRAMUSCULAR | Status: AC
  Filled 2024-02-21: qty 2

## 2024-02-21 MED ORDER — FENTANYL CITRATE (PF) 100 MCG/2ML IJ SOLN
INTRAMUSCULAR | Status: AC
  Filled 2024-02-21: qty 2

## 2024-02-21 MED ORDER — IOHEXOL 300 MG/ML  SOLN
150.0000 mL | Freq: Once | INTRAMUSCULAR | Status: AC | PRN
  Administered 2024-02-21: 50 mL via INTRAVENOUS

## 2024-02-21 MED ORDER — MIDAZOLAM HCL 2 MG/2ML IJ SOLN
INTRAMUSCULAR | Status: AC | PRN
  Administered 2024-02-21: .5 mg via INTRAVENOUS
  Administered 2024-02-21: 1 mg via INTRAVENOUS
  Administered 2024-02-21: .5 mg via INTRAVENOUS

## 2024-02-21 MED ORDER — SODIUM CHLORIDE 0.9 % IV SOLN: INTRAVENOUS | Status: DC

## 2024-02-21 MED ORDER — ALTEPLASE 2 MG IJ SOLR
INTRAMUSCULAR | Status: AC
Start: 2024-02-21 — End: 2024-02-21
  Filled 2024-02-21: qty 4

## 2024-02-21 MED ORDER — HEPARIN SODIUM (PORCINE) 1000 UNIT/ML IJ SOLN
INTRAMUSCULAR | Status: AC
  Filled 2024-02-21: qty 10

## 2024-02-21 MED ORDER — LIDOCAINE HCL 1 % IJ SOLN
20.0000 mL | Freq: Once | INTRAMUSCULAR | Status: AC
  Administered 2024-02-21: 10 mL via INTRADERMAL

## 2024-02-21 NOTE — H&P (Signed)
 Chief Complaint: Patient was seen in consultation today for malfunction of his RUE HERO graft  Referring Physician(s): Peeples,Samuel J  Supervising Physician: Karalee Beat  Patient Status: [facility] - Out-pt  History of Present Illness: Paul [name] is a 33 y.o. male know to IR for years-long history of ESRD on HD with poor vascular access requiring multiple interventions to establish and maintain reliable access.  He was last seen in 02/17/23 for a thrombosed LUE graft which failed intervention and he was catheter dependent through 04/2023 at which time a new right basilic-brachial AV fistula was created.  He was then seen for angioplasty of the innominate vein by [facility] 06/21/23, and conversion with interposition PTFE-AV graft 08/01/23 complicated by wound breakdown with possible infection requiring intra-operative I&D and excision 08/17/23.  Most recently he underwent placement of a righ tupper extremity brachial artery- based HeRO graft 11/10/23 with Dr. Sheree.  At the time of HeRO creation his only remaining suitable neck vein was a left EJ and a catheter was placed until regular HD established with his graft.  This has since been removed.  Unfortunately, he continues to have difficulty with RUE HeRO graft access.  He presents to [facility] today for thrombosed graft.   Patient presents to [facility] today in his usual state of health.  He has been NPO. He is understanding of the goals of the procedure and is agreeable to proceed. He does understand that, if needed, a tunneled dialysis catheter placement today may be done in the groin given his neck vein occlusions.   For procedural purposes patient is FULL CODE.    Past Medical History:  Diagnosis Date   Acute hypoxemic respiratory failure (HCC) 09/11/2016   Anemia    Chronic kidney disease    ARF on CRF Dialysis T/TH/Sa   Complication of anesthesia    per patient blood pressure drops   Diabetes mellitus    Type II   Difficult  intubation    End stage renal disease on dialysis (HCC)    [place]    GERD (gastroesophageal reflux disease)    diet controlled   HLD (hyperlipidemia)    Hypertension    Hypothyroidism    Morbid obesity (HCC)    Sacral wound    resolved   Thyroid  disease     Past Surgical History:  Procedure Laterality Date   AMPUTATION Left 05/08/2021   Procedure: AMPUTATION GREAT TOE;  Surgeon: Harden Jerona GAILS, MD;  Location: [facility] OR;  Service: Orthopedics;  Laterality: Left;   AMPUTATION Left 10/01/2022   Procedure: LEFT FOOT AMPUTATION RAY 1st;  Surgeon: Harden Jerona GAILS, MD;  Location: [facility] OR;  Service: Orthopedics;  Laterality: Left;   AORTOGRAM  11/10/2023   Procedure: central venogram;  Surgeon: Sheree Penne Bruckner, MD;  Location: [facility] OR;  Service: Vascular;;   APPLICATION OF WOUND VAC Right 08/17/2023   Procedure: APPLICATION, WOUND VAC;  Surgeon: Pearline Norman RAMAN, MD;  Location: [facility] OR;  Service: Vascular;  Laterality: Right;   AV FISTULA PLACEMENT Left 09/26/2016   Procedure: LEFT UPPER ARM ARTERIOVENOUS (AV) FISTULA CREATION;  Surgeon: Harvey Carlin BRAVO, MD;  Location: [facility] OR;  Service: Vascular;  Laterality: Left;   AV FISTULA PLACEMENT Left 12/02/2016   Procedure: INSERTION OF ARTERIOVENOUS GORE-TEX GRAFT LEFT UPPER  ARM USING A 4-7MM BY 45CM GRAFT ;  Surgeon: Oris Krystal FALCON, MD;  Location: [facility] OR;  Service: Vascular;  Laterality: Left;   AV FISTULA PLACEMENT Right 07/03/2020   Procedure:  INSERTION OF ARTERIOVENOUS (AV) GORE-TEX GRAFT RIGHT UPPER ARM;  Surgeon: Gretta Lonni PARAS, MD;  Location: [facility] OR;  Service: Vascular;  Laterality: Right;   AV FISTULA PLACEMENT Left 07/02/2021   Procedure: LEFT UPPER EXTREMITY ARTERIOVENOUS FISTULA CREATION;  Surgeon: Sheree Penne Lonni, MD;  Location: [facility] OR;  Service: Vascular;  Laterality: Left;   AV FISTULA PLACEMENT Right 05/12/2023   Procedure: RIGHT UPPER EXTREMITY ARTERIOVENOUS (AV) FISTULA CREATION;  Surgeon: Sheree Penne Lonni,  MD;  Location: [facility] OR;  Service: Vascular;  Laterality: Right;   AVGG REMOVAL Right 08/17/2023   Procedure: REMOVAL OF ARTERIOVENOUS GORETEX GRAFT (AVGG);  Surgeon: Pearline Norman RAMAN, MD;  Location: [facility] OR;  Service: Vascular;  Laterality: Right;   BASCILIC VEIN TRANSPOSITION Right 05/06/2020   Procedure: FIRST STAGE RIGHT BASCILIC VEIN TRANSPOSITION;  Surgeon: Serene Gaile ORN, MD;  Location: [facility] OR;  Service: Vascular;  Laterality: Right;   BASCILIC VEIN TRANSPOSITION Left 08/20/2021   Procedure: LEFT ARM SECOND STAGE WITH ARTERIOVENOUS GRAFT;  Surgeon: Sheree Penne Lonni, MD;  Location: [facility] OR;  Service: Vascular;  Laterality: Left;   BASCILIC VEIN TRANSPOSITION Right 08/01/2023   Procedure: RIGHT UPPER ARM FISTULA LIGATION;  Surgeon: Sheree Penne Lonni, MD;  Location: [facility] OR;  Service: Vascular;  Laterality: Right;   DIALYSIS/PERMA CATHETER INSERTION N/A 06/21/2023   Procedure: DIALYSIS/PERMA CATHETER INSERTION;  Surgeon: Melia Lynwood ORN, MD;  Location: [facility] INVASIVE CV LAB;  Service: Cardiovascular;  Laterality: N/A;   DIALYSIS/PERMA CATHETER REMOVAL N/A 06/21/2023   Procedure: DIALYSIS/PERMA CATHETER REMOVAL;  Surgeon: Melia Lynwood ORN, MD;  Location: [facility] INVASIVE CV LAB;  Service: Cardiovascular;  Laterality: N/A;   EXCHANGE OF A DIALYSIS CATHETER Right 10/08/2016   Procedure: EXCHANGE OF A DIALYSIS CATHETER-RIGHT INTERNAL JUGULAR;  Surgeon: Laurence Redell CROME, MD;  Location: [facility] OR;  Service: Vascular;  Laterality: Right;   INSERTION OF ARTERIOVENOUS (AV) ARTEGRAFT ARM Right 08/01/2023   Procedure: INSERTION, GRAFT, ARTERIOVENOUS, RIGHT UPPER EXTREMITY USING GORE TEX;  Surgeon: Sheree Penne Lonni, MD;  Location: [facility] OR;  Service: Vascular;  Laterality: Right;   INSERTION OF DIALYSIS CATHETER Right 09/26/2016   Procedure: INSERTION OF DIALYSIS CATHETER - Right Internal Jugular Placement;  Surgeon: Harvey Carlin BRAVO, MD;  Location: [facility] OR;  Service: Vascular;  Laterality: Right;   INSERTION OF DIALYSIS  CATHETER Left 03/22/2018   Procedure: INSERTION OF DIALYSIS CATHETER;  Surgeon: Gretta Lonni PARAS, MD;  Location: [facility] OR;  Service: Vascular;  Laterality: Left;   INSERTION OF DIALYSIS CATHETER Left 11/10/2023   Procedure: INSERTION OF DIALYSIS CATHETER;  Surgeon: Sheree Penne Lonni, MD;  Location: [facility] OR;  Service: Vascular;  Laterality: Left;   INTRAOPERATIVE ARTERIOGRAM Left 05/04/2022   Procedure: INTRA OPERATIVE ARTERIOGRAM;  Surgeon: Lanis Fonda BRAVO, MD;  Location: [facility] OR;  Service: Vascular;  Laterality: Left;   IR AV DIALY SHUNT INTRO NEEDLE/INTRAC INITIAL W/PTA/STENT/IMG LT Left 02/17/2023   IR AV DIALY SHUNT INTRO NEEDLE/INTRACATH INITIAL W/PTA/IMG LEFT  08/19/2022   IR AV DIALY SHUNT INTRO NEEDLE/INTRACATH INITIAL W/PTA/IMG LEFT  11/14/2022   IR DIALY SHUNT INTRO NEEDLE/INTRACATH INITIAL W/IMG LEFT Left 04/15/2022   IR FLUORO GUIDE CV LINE LEFT  04/19/2022   IR FLUORO GUIDE CV LINE LEFT  05/30/2022   IR FLUORO GUIDE CV LINE LEFT  03/24/2023   IR FLUORO GUIDE CV LINE RIGHT  03/26/2021   IR FLUORO GUIDE CV LINE RIGHT  05/13/2021   IR FLUORO GUIDE CV LINE RIGHT  05/04/2022   IR FLUORO GUIDE CV LINE RIGHT  02/17/2023   IR REMOVAL TUN CV CATH W/O FL  05/11/2021   IR REMOVAL TUN CV CATH W/O FL  10/06/2021   IR THROMBECTOMY AV FISTULA W/THROMBOLYSIS/PTA INC/SHUNT/IMG LEFT Left 02/02/2022   IR THROMBECTOMY AV FISTULA W/THROMBOLYSIS/PTA INC/SHUNT/IMG LEFT Left 03/22/2022   IR THROMBECTOMY AV FISTULA W/THROMBOLYSIS/PTA INC/SHUNT/IMG LEFT Left 05/04/2022   IR THROMBECTOMY AV FISTULA W/THROMBOLYSIS/PTA INC/SHUNT/IMG LEFT Left 12/09/2022   IR THROMBECTOMY AV FISTULA W/THROMBOLYSIS/PTA/STENT INC/SHUNT/IMG LT Left 04/27/2022   IR US  GUIDE VASC ACCESS LEFT  02/02/2022   IR US  GUIDE VASC ACCESS LEFT  03/22/2022   IR US  GUIDE VASC ACCESS LEFT  04/15/2022   IR US  GUIDE VASC ACCESS LEFT  04/19/2022   IR US  GUIDE VASC ACCESS LEFT  04/27/2022   IR US  GUIDE VASC ACCESS LEFT  12/13/2022   IR US  GUIDE VASC  ACCESS LEFT  02/21/2023   IR US  GUIDE VASC ACCESS RIGHT  03/26/2021   IR US  GUIDE VASC ACCESS RIGHT  05/13/2021   IR US  GUIDE VASC ACCESS RIGHT  05/04/2022   IR US  GUIDE VASC ACCESS RIGHT  02/17/2023   PERIPHERAL VASCULAR BALLOON ANGIOPLASTY  06/21/2023   Procedure: PERIPHERAL VASCULAR BALLOON ANGIOPLASTY;  Surgeon: Melia Lynwood ORN, MD;  Location: [facility] INVASIVE CV LAB;  Service: Cardiovascular;;  Innominate Vein   REVISON OF ARTERIOVENOUS FISTULA Left 03/22/2018   Procedure: LEFT UPPER EXTREMITY ARTERIOVENOUS  REVISION WITH GORE-TEX GRAFT.;  Surgeon: Gretta Lonni PARAS, MD;  Location: [facility] OR;  Service: Vascular;  Laterality: Left;   TEE WITHOUT CARDIOVERSION N/A 05/12/2021   Procedure: TRANSESOPHAGEAL ECHOCARDIOGRAM (TEE);  Surgeon: Santo Stanly LABOR, MD;  Location: [facility] ENDOSCOPY;  Service: Cardiovascular;  Laterality: N/A;   THROMBECTOMY Left 08/26/2019   thrombectomy of LUA loop AVG   THROMBECTOMY AND REVISION OF ARTERIOVENTOUS (AV) GORETEX  GRAFT Right 07/27/2020   Procedure: INSERTION OF RIGHT ARM LOOP GRAFT WITH EXCISION OF RIGHT ARM BRACHIAL AXILLARY GRAFT;  Surgeon: Gretta Lonni PARAS, MD;  Location: [facility] OR;  Service: Vascular;  Laterality: Right;   THROMBECTOMY BRACHIAL ARTERY Left 05/04/2022   Procedure: LEFT ARM RADIAL BRACHIAL ARTERY EMBOLECTOMY;  Surgeon: Lanis Fonda BRAVO, MD;  Location: [facility] OR;  Service: Vascular;  Laterality: Left;   UPPER EXTREMITY VENOGRAPHY Bilateral 06/14/2021   Procedure: UPPER EXTREMITY VENOGRAPHY;  Surgeon: Sheree Penne Lonni, MD;  Location: [facility] INVASIVE CV LAB;  Service: Cardiovascular;  Laterality: Bilateral;   UPPER EXTREMITY VENOGRAPHY N/A 04/24/2023   Procedure: UPPER EXTREMITY VENOGRAPHY;  Surgeon: Sheree Penne Lonni, MD;  Location: [facility] INVASIVE CV LAB;  Service: Cardiovascular;  Laterality: N/A;   VASCULAR ACCESS DEVICE INSERTION Right 11/10/2023   Procedure: INSERTION, CATHETER, HERO;  Surgeon: Sheree Penne Lonni, MD;  Location: [facility] OR;   Service: Vascular;  Laterality: Right;   VISCERAL ANGIOGRAPHY Right 06/17/2020   Procedure: CENTRAL VENO;  Surgeon: Magda Debby SAILOR, MD;  Location: [facility] INVASIVE CV LAB;  Service: Cardiovascular;  Laterality: Right;   WOUND DEBRIDEMENT Right 08/17/2023   Procedure: DEBRIDEMENT, WOUND;  Surgeon: Pearline Norman RAMAN, MD;  Location: [facility] OR;  Service: Vascular;  Laterality: Right;    Allergies: Hydralazine  hcl  Medications: Prior to Admission medications   Medication Sig Start Date End Date Taking? Authorizing Provider  acetaminophen  (TYLENOL ) 500 MG tablet Take 1,000 mg by mouth every 6 (six) hours as needed for moderate pain (pain score 4-6).   Yes [provider]  apixaban  (ELIQUIS ) 5 MG TABS tablet Take 1 tablet (5 mg total) by mouth 2 (two) times daily. 01/10/24  Yes  Tharon Lung, MD  aspirin  EC 81 MG tablet Take 1 tablet (81 mg total) by mouth daily. Swallow whole. 01/10/24  Yes Mabe, Lung, MD  cinacalcet  (SENSIPAR ) 30 MG tablet Take 1 tablet (30 mg total) by mouth daily. 01/10/24  Yes Tharon Lung, MD  cyclobenzaprine  (FLEXERIL ) 5 MG tablet Take 1 tablet (5 mg total) by mouth 3 (three) times daily as needed for muscle spasms. 01/10/24  Yes Tharon Lung, MD  diphenhydrAMINE  (BENADRYL ) 25 MG tablet Take 25 mg by mouth every 6 (six) hours as needed for itching.   Yes [provider]  ferric citrate  (AURYXIA ) 1 GM 210 MG(Fe) tablet Take 2-4 tablets (420-840 mg total) by mouth See admin instructions. Take 4 tablets (840 mg) by mouth with each meal & take 2 tablets (420 mg) by mouth with each snack. 01/25/24  Yes Suknaim, Kulkaew B, DO  gabapentin  (NEURONTIN ) 300 MG capsule Take 1 capsule (300 mg total) by mouth at bedtime. 01/10/24  Yes Mabe, Lung, MD  levothyroxine  (SYNTHROID ) 150 MCG tablet TAKE 1 TABLET BY MOUTH ONCE DAILY BEFORE BREAKFAST 02/19/24  Yes Suknaim, Kulkaew B, DO  multivitamin (RENA-VIT) TABS tablet Take 1 tablet by mouth at bedtime. 05/11/22  Yes Dameron, Marisa, DO   rosuvastatin  (CRESTOR ) 20 MG tablet Take 1 tablet (20 mg total) by mouth daily. 01/10/24  Yes Tharon Lung, MD  calcium  elemental as carbonate (TUMS ULTRA 1000) 400 MG chewable tablet Chew 1,000 mg by mouth daily as needed for heartburn. Patient taking differently: Chew 1,000 mg by mouth daily.    [provider]  LANTUS  100 UNIT/ML injection Inject 0.05-0.08 mLs (5-8 Units total) into the skin 2 (two) times daily. 01/10/24   Tharon Lung, MD  midodrine  (PROAMATINE ) 5 MG tablet Take 1 tablet (5 mg total) by mouth Every Tuesday,Thursday,and Saturday with dialysis. 01/11/24   Tharon Lung, MD     Family History  Problem Relation Age of Onset   Heart disease Mother    Hypertension Mother     Social History   Socioeconomic History   Marital status: Single    Spouse name: Not on file   Number of children: Not on file   Years of education: Not on file   Highest education level: Not on file  Occupational History   Not on file  Tobacco Use   Smoking status: Never   Smokeless tobacco: Never  Vaping Use   Vaping status: Never Used  Substance and Sexual Activity   Alcohol use: No   Drug use: Yes    Frequency: 2.0 times per week    Types: Marijuana    Comment: weekly   Sexual activity: Yes  Other Topics Concern   Not on file  Social History Narrative   Not on file   Social Drivers of Health   Financial Resource Strain: Not on file  Food Insecurity: No Food Insecurity (09/29/2022)   Hunger Vital Sign    Worried About Running Out of Food in the Last Year: Never true    Ran Out of Food in the Last Year: Never true  Transportation Needs: No Transportation Needs (09/29/2022)   PRAPARE - Administrator, Civil Service (Medical): No    Lack of Transportation (Non-Medical): No  Physical Activity: Insufficiently Active (07/07/2021)   Exercise Vital Sign    Days of Exercise per Week: 4 days    Minutes of Exercise per Session: 30 min  Stress: Stress Concern Present  (08/06/2021)   [employer] of Occupational  Health - Occupational Stress Questionnaire    Feeling of Stress : To some extent  Social Connections: Socially Isolated (06/07/2021)   Social Connection and Isolation Panel    Frequency of Communication with Friends and Family: More than three times a week    Frequency of Social Gatherings with Friends and Family: More than three times a week    Attends Religious Services: Never    Database administrator or Organizations: No    Attends Banker Meetings: Never    Marital Status: Never married     Review of Systems: A 12 point ROS discussed and pertinent positives are indicated in the HPI above.  All other systems are negative.  Review of Systems  Vital Signs: There were no vitals taken for this visit.  Physical Exam       Imaging: No results found.  Labs:  CBC: Recent Labs    08/17/23 1225 08/17/23 2027 11/10/23 0619 01/10/24 1021  WBC  --  16.7*  --  9.4  HGB 14.3 10.6* 18.4* 16.3  HCT 42.0 33.2* 54.0* 49.5  PLT  --  173  --  252    COAGS: Recent Labs    08/17/23 2027  INR 1.3*    BMP: Recent Labs    08/17/23 1225 08/17/23 2027 11/10/23 0619 01/10/24 1021  NA 136 136 136 142  K 5.0 5.3* 4.5 5.0  CL 104 100 100 92*  CO2  --  20*  --  23  GLUCOSE 149* 278* 148* 110*  BUN 44* 32* 40* 41*  CALCIUM   --  9.2  --  11.4*  CREATININE 11.70* 11.21* 15.20* 13.53*  GFRNONAA  --  6*  --   --     LIVER FUNCTION TESTS: Recent Labs    08/17/23 2027  BILITOT 0.5  AST 22  ALT 11  ALKPHOS 54  PROT 8.2*  ALBUMIN  4.0    TUMOR MARKERS: No results for input(s): AFPTM, CEA, CA199, CHROMGRNA in the last 8760 hours.  Assessment and Plan: Patient with past medical history of multiple failed HD access placements presents with complaint of clotted RUE HeRO graft.  IR consulted for declot at the request of Dr. Macel. Case reviewed by Dr. Karalee who approves patient for procedure.   Patient presents today in their usual state of health.  He has been NPO and is not currently on blood thinners.   Risks and benefits discussed with the patient including, but not limited to bleeding, infection, vascular injury, pulmonary embolism, need for tunneled HD catheter placement or even death.  All of the patient's questions were answered, patient is agreeable to proceed. Consent signed and in chart.   Thank you for this interesting consult.  I greatly enjoyed meeting Aztlan Coll and look forward to participating in their care.  A copy of this report was sent to the requesting provider on this date.  Electronically Signed: Mercedez Boule Sue-Ellen Kayda Allers, PA 02/21/2024, 10:35 AM   I spent a total of  30 Minutes   in face to face in clinical consultation, greater than 50% of which was counseling/coordinating care for clotted RUE HeRO graft.

## 2024-02-21 NOTE — Procedures (Signed)
 Interventional Radiology Procedure Note  Procedure: Successful declot procedure  Complications: None  Estimated Blood Loss: 25 mL  Recommendations: - DC home   Signed,  Wilkie LOIS Lent, MD

## 2024-02-25 ENCOUNTER — Encounter (HOSPITAL_COMMUNITY): Payer: Self-pay

## 2024-02-25 ENCOUNTER — Emergency Department (HOSPITAL_COMMUNITY)

## 2024-02-25 ENCOUNTER — Emergency Department (HOSPITAL_COMMUNITY)
Admission: EM | Admit: 2024-02-25 | Discharge: 2024-02-25 | Disposition: A | Discharge status: Home / Self Care | Attending: Emergency Medicine | Admitting: Emergency Medicine

## 2024-02-25 ENCOUNTER — Other Ambulatory Visit: Payer: Self-pay

## 2024-02-25 DIAGNOSIS — Z7982 Long term (current) use of aspirin: Secondary | ICD-10-CM | POA: Insufficient documentation

## 2024-02-25 DIAGNOSIS — N186 End stage renal disease: Secondary | ICD-10-CM | POA: Insufficient documentation

## 2024-02-25 DIAGNOSIS — Z992 Dependence on renal dialysis: Secondary | ICD-10-CM | POA: Insufficient documentation

## 2024-02-25 DIAGNOSIS — I120 Hypertensive chronic kidney disease with stage 5 chronic kidney disease or end stage renal disease: Secondary | ICD-10-CM | POA: Insufficient documentation

## 2024-02-25 DIAGNOSIS — Z79899 Other long term (current) drug therapy: Secondary | ICD-10-CM | POA: Insufficient documentation

## 2024-02-25 DIAGNOSIS — T82868A Thrombosis of vascular prosthetic devices, implants and grafts, initial encounter: Secondary | ICD-10-CM | POA: Insufficient documentation

## 2024-02-25 DIAGNOSIS — Y712 Prosthetic and other implants, materials and accessory cardiovascular devices associated with adverse incidents: Secondary | ICD-10-CM | POA: Diagnosis not present

## 2024-02-25 DIAGNOSIS — Z7901 Long term (current) use of anticoagulants: Secondary | ICD-10-CM | POA: Insufficient documentation

## 2024-02-25 MED ORDER — APIXABAN 5 MG PO TABS: 5.0000 mg | ORAL_TABLET | Freq: Two times a day (BID) | ORAL | 0 refills | Status: AC

## 2024-02-25 MED ORDER — APIXABAN 5 MG PO TABS
5.0000 mg | ORAL_TABLET | Freq: Once | ORAL | Status: AC
  Administered 2024-02-25: 5 mg via ORAL
  Filled 2024-02-25: qty 1

## 2024-02-25 NOTE — Progress Notes (Signed)
 RUE dialysis duplex has been completed.  Preliminary results given to Dr. Pearline.   Results can be found under chart review under CV PROC. 02/25/2024 1:05 PM Jimmie Dattilio RVT, RDMS

## 2024-02-25 NOTE — ED Triage Notes (Signed)
 Patient has been battling with his fistula clotting because his insurance will not cover eliquis .  Reports he just got it declotted Wednesday and its clotted again.

## 2024-02-25 NOTE — ED Provider Notes (Signed)
 [facility] AT [facility] Provider Note   CSN: [id number] Arrival date & time: 02/25/24  9164     Patient presents with: Vascular Access Problem   Paul [name] is a 33 y.o. male.   Patient is a 33 year old male with end-stage renal disease on dialysis, hypertension who is presenting today with complaints of concern that his right upper extremity dialysis graft has clotted.  Patient last had his graft clot this past Wednesday and had to have it declotted.  He went to dialysis on Thursday and Saturday and reports that he was able to have dialysis however last night noticed that he lost the thrill in his graft and the pulse and he is concerned that it is clotted again.  He reports that he is concerned because he normally takes Eliquis  twice daily and sometimes even takes extra doses because he has a tendency to clot.  He usually gets his prescription filled a week early so that he never runs out but the pharmacy will not fill it at this time until the day it is due which is on the eighth.  So he has now gone 4 days without his Eliquis .  He is not having any significant arm swelling does report some minimal pain.  He otherwise has no complaints at this time but is concerned that if he does not get this evaluated before Tuesday he will not be able to have his scheduled dialysis.  The history is provided by the patient and medical records.       Prior to Admission medications   Medication Sig Start Date End Date Taking? Authorizing Provider  apixaban  (ELIQUIS ) 5 MG TABS tablet Take 1 tablet (5 mg total) by mouth 2 (two) times daily. 02/25/24  Yes Doretha Folks, MD  acetaminophen  (TYLENOL ) 500 MG tablet Take 1,000 mg by mouth every 6 (six) hours as needed for moderate pain (pain score 4-6).    [provider]  apixaban  (ELIQUIS ) 5 MG TABS tablet Take 1 tablet by mouth twice daily 02/22/24   Suknaim, Kulkaew B, DO  aspirin  EC 81 MG tablet Take 1 tablet (81  mg total) by mouth daily. Swallow whole. 01/10/24   Tharon Lung, MD  calcium  elemental as carbonate (TUMS ULTRA 1000) 400 MG chewable tablet Chew 1,000 mg by mouth daily as needed for heartburn. Patient taking differently: Chew 1,000 mg by mouth daily.    [provider]  cinacalcet  (SENSIPAR ) 30 MG tablet Take 1 tablet (30 mg total) by mouth daily. 01/10/24   Tharon Lung, MD  cyclobenzaprine  (FLEXERIL ) 5 MG tablet Take 1 tablet (5 mg total) by mouth 3 (three) times daily as needed for muscle spasms. 01/10/24   Tharon Lung, MD  diphenhydrAMINE  (BENADRYL ) 25 MG tablet Take 25 mg by mouth every 6 (six) hours as needed for itching.    [provider]  ferric citrate  (AURYXIA ) 1 GM 210 MG(Fe) tablet Take 2-4 tablets (420-840 mg total) by mouth See admin instructions. Take 4 tablets (840 mg) by mouth with each meal & take 2 tablets (420 mg) by mouth with each snack. 01/25/24   Suknaim, [place] B, DO  gabapentin  (NEURONTIN ) 300 MG capsule Take 1 capsule (300 mg total) by mouth at bedtime. 01/10/24   Tharon Lung, MD  LANTUS  100 UNIT/ML injection Inject 0.05-0.08 mLs (5-8 Units total) into the skin 2 (two) times daily. 01/10/24   Tharon Lung, MD  levothyroxine  (SYNTHROID ) 150 MCG tablet TAKE 1 TABLET BY MOUTH ONCE DAILY  BEFORE BREAKFAST 02/19/24   Suzen Elder B, DO  midodrine  (PROAMATINE ) 5 MG tablet Take 1 tablet (5 mg total) by mouth Every Tuesday,Thursday,and Saturday with dialysis. 01/11/24   Tharon Lung, MD  multivitamin (RENA-VIT) TABS tablet Take 1 tablet by mouth at bedtime. 05/11/22   Dameron, Marisa, DO  rosuvastatin  (CRESTOR ) 20 MG tablet Take 1 tablet (20 mg total) by mouth daily. 01/10/24   Tharon Lung, MD    Allergies: Hydralazine  hcl    Review of Systems  Updated Vital Signs BP 116/79 (BP Location: Left Arm)   Pulse 97   Temp 97.8 F (36.6 C) (Oral)   Resp 18   Ht 6' 2 (1.88 m)   Wt (!) 154.2 kg   SpO2 98%   BMI 43.65 kg/m   Physical Exam Vitals and nursing  note reviewed.  Constitutional:      General: He is not in acute distress.    Appearance: He is well-developed.  HENT:     Head: Normocephalic and atraumatic.  Eyes:     Conjunctiva/sclera: Conjunctivae normal.     Pupils: Pupils are equal, round, and reactive to light.  Cardiovascular:     Rate and Rhythm: Normal rate and regular rhythm.     Pulses: Normal pulses.     Heart sounds: No murmur heard. Pulmonary:     Effort: Pulmonary effort is normal. No respiratory distress.     Breath sounds: Normal breath sounds. No wheezing or rales.  Musculoskeletal:        General: No tenderness. Normal range of motion.     Cervical back: Normal range of motion and neck supple.     Comments: Graft noted in the RUE but no thrill or pulse palpated in the graft.  No swelling or pain noted  Skin:    General: Skin is warm and dry.     Findings: No erythema or rash.  Neurological:     Mental Status: He is alert and oriented to person, place, and time.  Psychiatric:        Behavior: Behavior normal.     (all labs ordered are listed, but only abnormal results are displayed) Labs Reviewed - No data to display  EKG: None  Radiology: VAS US  DUPLEX DIALYSIS ACCESS (AVF, AVG) Result Date: 02/25/2024 DIALYSIS ACCESS Patient Name:  Paul [name]  Date of Exam:   02/25/2024 Medical Rec #: [id number]      Accession #:    [id number] Date of Birth: 16-Feb-1991       Patient Gender: M Patient Age:   87 years Exam Location:  [facility] Procedure:      VAS US  DUPLEX DIALYSIS ACCESS (AVF, AVG) Referring Phys: NORMAN SERVE --------------------------------------------------------------------------------  Reason for Exam: ESRD. Access Site: Right Upper Extremity. Access Type: Brachial artery HeRO graft. History: Patient had HeRO graft declotted by IR on 02/21/2024, patient states he          can't feel thrill and came to have access evaluated for re-occlusion. Comparison Study: No previous exams Performing  Technologist: Jody Hill RVT, RDMS  Examination Guidelines: A complete evaluation includes B-mode imaging, spectral Doppler, color Doppler, and power Doppler as needed of all accessible portions of each vessel. Unilateral testing is considered an integral part of a complete examination. Limited examinations for reoccurring indications may be performed as noted.  Findings:   +--------------------+----------+-----------------+--------+ AVG                 PSV (cm/s)Flow Vol (mL/min)Describe +--------------------+----------+-----------------+--------+ Native  artery inflow    54           58                 +--------------------+----------+-----------------+--------+ Arterial anastomosis                           occluded +--------------------+----------+-----------------+--------+ Prox graft                                     occluded +--------------------+----------+-----------------+--------+ Mid graft                                      occluded +--------------------+----------+-----------------+--------+ Distal graft                                   occluded +--------------------+----------+-----------------+--------+ Venous anastomosis                             occluded +--------------------+----------+-----------------+--------+ Venous outflow                                 occluded +--------------------+----------+-----------------+--------+ Subclavian vein and IJV are patent, however both show continuous flow.  Summary: Brachial artery HeRO graft is occluded in it's entirety  *See table(s) above for measurements and observations.    --------------------------------------------------------------------------------   Preliminary      Procedures   Medications Ordered in the ED  apixaban  (ELIQUIS ) tablet 5 mg (5 mg Oral Given 02/25/24 0930)                                    Medical Decision Making Amount and/or Complexity of Data Reviewed Radiology: ordered  and independent interpretation performed. Decision-making details documented in ED Course.  Risk Prescription drug management.   Pt with multiple medical problems and comorbidities and presenting today with a complaint that caries a high risk for morbidity and mortality.  Here today due to concern that his dialysis graft has clotted again.  Also he has not had his Eliquis  for the last 4 days because the pharmacy will not fill it until Wednesday.  Patient was given a dose of Eliquis  here and will attempt to get 7 tablets of the Eliquis  so that he can take it until he can get his prescription filled.  Also spoke with Dr. Pearline with vascular surgery who recommended doing a duplex of the right upper extremity and if it is clotted we may be able to schedule an appointment with IR for declotting as patient does not want to miss his regularly scheduled dialysis.  He had his normal dialysis on Saturday and his next scheduled course would be on Tuesday.  He otherwise does not complain of any shortness of breath, chest pain or new swelling.  Patient will be n.p.o. after midnight and somebody from interventional radiology will call him tomorrow morning for declotting procedure.  At this time patient appears stable for discharge home.  He is comfortable with this plan will pick up his Eliquis  from the pharmacy which has agreed to fill 5 tablets until he  can pick up his normal prescription on Wednesday.     Final diagnoses:  Anticoagulated  Thrombosis of kidney dialysis arteriovenous graft, initial encounter    ED Discharge Orders          Ordered    Ambulatory referral to Hematology / Oncology        02/25/24 1101    apixaban  (ELIQUIS ) 5 MG TABS tablet  2 times daily        02/25/24 1101    IR Radiologist Eval & Mgmt        02/25/24 1351               Doretha Folks, MD 02/25/24 1404

## 2024-02-25 NOTE — Discharge Instructions (Addendum)
 IR discharge instructions: Please note that since you were seen on the weekend, a scheduler will not be able to call and give you a specific time until Monday. We are aiming to complete your declot Monday. Please do not eat or drink anything starting Sunday night at midnight in preparation for sedation medicine Monday. Patient care line is [phone]; however, our scheduler will call you in AM.   The 5 additional pills of Eliquis  were sent to your pharmacy and you can pick those up today.

## 2024-02-26 ENCOUNTER — Other Ambulatory Visit: Payer: Self-pay | Admitting: Student

## 2024-02-26 NOTE — H&P (Signed)
 Chief Complaint: Concern for clotted HD graft. Patient presents for fistulugram with possible intervention   Referring Physician(s): Dr, Jayson Player   Supervising Physician: Philip Cornet  Patient Status: [name] - Out-pt  History of Present Illness: Paul [name] is a 33 y.o. male 33 y.o. male outpatient, Known to IR. History of ESRD withultiple AVF.  Currently recenrived HD via a right upper extremity brachial artery HeRO graft placed on 6.20.25. Patient presented to the ED at [facility] on 10.5.25 du concerns for a clotted graft.  Patient states that he typically takes eliquis  but was not able to take it for 4 day. 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 Patient presents for fistulogram with possible intervention.   Last fistulolgram performed by IR on 10.1.25. Per procedure notes from IR Attending Dr. Wilkie Lent a 6 X 100 mm mustang angioplasty and thrombectomy was performed thourghout the graft  and a 5 Fr Fogarty balloon was pull the arterial plug. Follow up arteriogram showed residual thrombus in the main body of the graft rheolytic thormbectomy and balloon maceration was performed. Due to occlusive right subclavian and innovate vain Paul [name]    Currently without any significant complaints. Patient alert and laying in bed,calm. Denies any fevers, headache, chest pain, SOB, cough, abdominal pain, nausea, vomiting or bleeding.   Patient is on eliquis . Last dose given on 10.7.25. No pertinent allergies. Patient has been NPO since midnight.   Return precautions and treatment recommendations and follow-up discussed with the patient  who is agreeable with the plan.    Past Medical History:  Diagnosis Date   Acute hypoxemic respiratory failure (HCC) 09/11/2016   Anemia    Chronic kidney disease    ARF on CRF Dialysis T/TH/Sa   Complication of anesthesia    per patient blood pressure drops   Diabetes mellitus     Type II   Difficult intubation    End stage renal disease on dialysis (HCC)    [place]    GERD (gastroesophageal reflux disease)    diet controlled   HLD (hyperlipidemia)    Hypertension    Hypothyroidism    Morbid obesity (HCC)    Sacral wound    resolved   Thyroid  disease     Past Surgical History:  Procedure Laterality Date   AMPUTATION Left 05/08/2021   Procedure: AMPUTATION GREAT TOE;  Surgeon: Harden Jerona GAILS, MD;  Location: [facility] OR;  Service: Orthopedics;  Laterality: Left;   AMPUTATION Left 10/01/2022   Procedure: LEFT FOOT AMPUTATION RAY 1st;  Surgeon: Harden Jerona GAILS, MD;  Location: [facility] OR;  Service: Orthopedics;  Laterality: Left;   AORTOGRAM  11/10/2023   Procedure: central venogram;  Surgeon: Sheree Penne Bruckner, MD;  Location: [facility] OR;  Service: Vascular;;   APPLICATION OF WOUND VAC Right 08/17/2023   Procedure: APPLICATION, WOUND VAC;  Surgeon: Pearline Norman RAMAN, MD;  Location: [facility] OR;  Service: Vascular;  Laterality: Right;   AV FISTULA PLACEMENT Left 09/26/2016   Procedure: LEFT UPPER ARM ARTERIOVENOUS (AV) FISTULA CREATION;  Surgeon: Harvey Carlin BRAVO, MD;  Location: [facility] OR;  Service: Vascular;  Laterality: Left;   AV FISTULA PLACEMENT Left 12/02/2016   Procedure: INSERTION OF ARTERIOVENOUS GORE-TEX GRAFT LEFT UPPER  ARM USING A 4-7MM BY 45CM GRAFT ;  Surgeon: Oris Krystal FALCON, MD;  Location: [facility] OR;  Service: Vascular;  Laterality: Left;   AV FISTULA PLACEMENT Right 07/03/2020   Procedure: INSERTION  OF ARTERIOVENOUS (AV) GORE-TEX GRAFT RIGHT UPPER ARM;  Surgeon: Gretta Lonni PARAS, MD;  Location: [facility] OR;  Service: Vascular;  Laterality: Right;   AV FISTULA PLACEMENT Left 07/02/2021   Procedure: LEFT UPPER EXTREMITY ARTERIOVENOUS FISTULA CREATION;  Surgeon: Sheree Penne Lonni, MD;  Location: [facility] OR;  Service: Vascular;  Laterality: Left;   AV FISTULA PLACEMENT Right 05/12/2023   Procedure: RIGHT UPPER EXTREMITY ARTERIOVENOUS (AV) FISTULA CREATION;  Surgeon: Sheree Penne Lonni, MD;  Location: [facility] OR;  Service: Vascular;  Laterality: Right;   AVGG REMOVAL Right 08/17/2023   Procedure: REMOVAL OF ARTERIOVENOUS GORETEX GRAFT (AVGG);  Surgeon: Pearline Norman RAMAN, MD;  Location: [name] OR;  Service: Vascular;  Laterality: Right;   BASCILIC VEIN TRANSPOSITION Right 05/06/2020   Procedure: FIRST STAGE RIGHT BASCILIC VEIN TRANSPOSITION;  Surgeon: Serene Gaile ORN, MD;  Location: [facility] OR;  Service: Vascular;  Laterality: Right;   BASCILIC VEIN TRANSPOSITION Left 08/20/2021   Procedure: LEFT ARM SECOND STAGE WITH ARTERIOVENOUS GRAFT;  Surgeon: Sheree Penne Lonni, MD;  Location: [facility] OR;  Service: Vascular;  Laterality: Left;   BASCILIC VEIN TRANSPOSITION Right 08/01/2023   Procedure: RIGHT UPPER ARM FISTULA LIGATION;  Surgeon: Sheree Penne Lonni, MD;  Location: [facility] OR;  Service: Vascular;  Laterality: Right;   DIALYSIS/PERMA CATHETER INSERTION N/A 06/21/2023   Procedure: DIALYSIS/PERMA CATHETER INSERTION;  Surgeon: Melia Lynwood ORN, MD;  [facility]: [facility] INVASIVE CV LAB;  Service: Cardiovascular;  Laterality: N/A;   DIALYSIS/PERMA CATHETER REMOVAL N/A 06/21/2023   Procedure: DIALYSIS/PERMA CATHETER REMOVAL;  Surgeon: Melia Lynwood ORN, MD;  Location: [facility] INVASIVE CV LAB;  Service: Cardiovascular;  Laterality: N/A;   EXCHANGE OF A DIALYSIS CATHETER Right 10/08/2016   Procedure: EXCHANGE OF A DIALYSIS CATHETER-RIGHT INTERNAL JUGULAR;  Surgeon: Laurence Redell CROME, MD;  Location: [facility] OR;  Service: Vascular;  Laterality: Right;   INSERTION OF ARTERIOVENOUS (AV) ARTEGRAFT ARM Right 08/01/2023   Procedure: INSERTION, GRAFT, ARTERIOVENOUS, RIGHT UPPER EXTREMITY USING GORE TEX;  Surgeon: Sheree Penne Lonni, MD;  Location: [facility] OR;  Service: Vascular;  Laterality: Right;   INSERTION OF DIALYSIS CATHETER Right 09/26/2016   Procedure: INSERTION OF DIALYSIS CATHETER - Right Internal Jugular Placement;  Surgeon: Harvey Carlin BRAVO, MD;  Location: [facility] OR;  Service: Vascular;  Laterality: Right;    INSERTION OF DIALYSIS CATHETER Left 03/22/2018   Procedure: INSERTION OF DIALYSIS CATHETER;  Surgeon: Gretta Lonni PARAS, MD;  Location: [facility] OR;  Service: Vascular;  Laterality: Left;   INSERTION OF DIALYSIS CATHETER Left 11/10/2023   Procedure: INSERTION OF DIALYSIS CATHETER;  Surgeon: Sheree Penne Lonni, MD;  Location: [name] OR;  Service: Vascular;  Laterality: Left;   INTRAOPERATIVE ARTERIOGRAM Left 05/04/2022   Procedure: INTRA OPERATIVE ARTERIOGRAM;  Surgeon: Lanis Fonda BRAVO, MD;  Location: [facility] OR;  Service: Vascular;  Laterality: Left;   IR AV DIALY SHUNT INTRO NEEDLE/INTRAC INITIAL W/PTA/STENT/IMG LT Left 02/17/2023   IR AV DIALY SHUNT INTRO NEEDLE/INTRACATH INITIAL W/PTA/IMG LEFT  08/19/2022   IR AV DIALY SHUNT INTRO NEEDLE/INTRACATH INITIAL W/PTA/IMG LEFT  11/14/2022   IR DIALY SHUNT INTRO NEEDLE/INTRACATH INITIAL W/IMG LEFT Left 04/15/2022   IR FLUORO GUIDE CV LINE LEFT  04/19/2022   IR FLUORO GUIDE CV LINE LEFT  05/30/2022   IR FLUORO GUIDE CV LINE LEFT  03/24/2023   IR FLUORO GUIDE CV LINE RIGHT  03/26/2021   IR FLUORO GUIDE CV LINE RIGHT  05/13/2021   IR FLUORO GUIDE CV LINE RIGHT  05/04/2022   IR FLUORO GUIDE CV LINE RIGHT  02/17/2023  IR REMOVAL TUN CV CATH W/O FL  05/11/2021   IR REMOVAL TUN CV CATH W/O FL  10/06/2021   IR THROMBECTOMY AV FISTULA W/THROMBOLYSIS/PTA INC/SHUNT/IMG LEFT Left 02/02/2022   IR THROMBECTOMY AV FISTULA W/THROMBOLYSIS/PTA INC/SHUNT/IMG LEFT Left 03/22/2022   IR THROMBECTOMY AV FISTULA W/THROMBOLYSIS/PTA INC/SHUNT/IMG LEFT Left 05/04/2022   IR THROMBECTOMY AV FISTULA W/THROMBOLYSIS/PTA INC/SHUNT/IMG LEFT Left 12/09/2022   IR THROMBECTOMY AV FISTULA W/THROMBOLYSIS/PTA INC/SHUNT/IMG RIGHT Right 02/21/2024   IR THROMBECTOMY AV FISTULA W/THROMBOLYSIS/PTA/STENT INC/SHUNT/IMG LT Left 04/27/2022   IR US  GUIDE VASC ACCESS LEFT  02/02/2022   IR US  GUIDE VASC ACCESS LEFT  03/22/2022   IR US  GUIDE VASC ACCESS LEFT  04/15/2022   IR US  GUIDE VASC ACCESS LEFT  04/19/2022    IR US  GUIDE VASC ACCESS LEFT  04/27/2022   IR US  GUIDE VASC ACCESS LEFT  12/13/2022   IR US  GUIDE VASC ACCESS LEFT  02/21/2023   IR US  GUIDE VASC ACCESS RIGHT  03/26/2021   IR US  GUIDE VASC ACCESS RIGHT  05/13/2021   IR US  GUIDE VASC ACCESS RIGHT  05/04/2022   IR US  GUIDE VASC ACCESS RIGHT  02/17/2023   IR US  GUIDE VASC ACCESS RIGHT  02/21/2024   PERIPHERAL VASCULAR BALLOON ANGIOPLASTY  06/21/2023   Procedure: PERIPHERAL VASCULAR BALLOON ANGIOPLASTY;  Surgeon: Melia Lynwood ORN, MD;  [facility]: [facility] INVASIVE CV LAB;  Service: Cardiovascular;;  Innominate Vein   REVISON OF ARTERIOVENOUS FISTULA Left 03/22/2018   Procedure: LEFT UPPER EXTREMITY ARTERIOVENOUS  REVISION WITH GORE-TEX GRAFT.;  Surgeon: Gretta Lonni PARAS, MD;  Location: [facility] OR;  Service: Vascular;  Laterality: Left;   TEE WITHOUT CARDIOVERSION N/A 05/12/2021   Procedure: TRANSESOPHAGEAL ECHOCARDIOGRAM (TEE);  Surgeon: Santo Stanly LABOR, MD;  Location: [facility] ENDOSCOPY;  Service: Cardiovascular;  Laterality: N/A;   THROMBECTOMY Left 08/26/2019   thrombectomy of LUA loop AVG   THROMBECTOMY AND REVISION OF ARTERIOVENTOUS (AV) GORETEX  GRAFT Right 07/27/2020   Procedure: INSERTION OF RIGHT ARM LOOP GRAFT WITH EXCISION OF RIGHT ARM BRACHIAL AXILLARY GRAFT;  Surgeon: Gretta Lonni PARAS, MD;  Location: [facility] OR;  Service: Vascular;  Laterality: Right;   THROMBECTOMY BRACHIAL ARTERY Left 05/04/2022   Procedure: LEFT ARM RADIAL BRACHIAL ARTERY EMBOLECTOMY;  Surgeon: Lanis Fonda BRAVO, MD;  Location: [facility] OR;  Service: Vascular;  Laterality: Left;   UPPER EXTREMITY VENOGRAPHY Bilateral 06/14/2021   Procedure: UPPER EXTREMITY VENOGRAPHY;  Surgeon: Sheree Penne Lonni, MD;  Location: [name] INVASIVE CV LAB;  Service: Cardiovascular;  Laterality: Bilateral;   UPPER EXTREMITY VENOGRAPHY N/A 04/24/2023   Procedure: UPPER EXTREMITY VENOGRAPHY;  Surgeon: Sheree Penne Lonni, MD;  Location: [facility] INVASIVE CV LAB;  Service: Cardiovascular;  Laterality: N/A;    VASCULAR ACCESS DEVICE INSERTION Right 11/10/2023   Procedure: INSERTION, CATHETER, HERO;  Surgeon: Sheree Penne Lonni, MD;  Location: [facility] OR;  Service: Vascular;  Laterality: Right;   VISCERAL ANGIOGRAPHY Right 06/17/2020   Procedure: CENTRAL VENO;  Surgeon: Magda Debby SAILOR, MD;  Location: [facility] INVASIVE CV LAB;  Service: Cardiovascular;  Laterality: Right;   WOUND DEBRIDEMENT Right 08/17/2023   Procedure: DEBRIDEMENT, WOUND;  Surgeon: Pearline Norman RAMAN, MD;  Location: [facility] OR;  Service: Vascular;  Laterality: Right;    Allergies: Hydralazine  hcl  Medications: Prior to Admission medications   Medication Sig Start Date End Date Taking? Authorizing Provider  acetaminophen  (TYLENOL ) 500 MG tablet Take 1,000 mg by mouth every 6 (six) hours as needed for moderate pain (pain score 4-6).    [provider]  apixaban  (ELIQUIS ) 5  MG TABS tablet Take 1 tablet by mouth twice daily 02/22/24   Suknaim, Kulkaew B, DO  apixaban  (ELIQUIS ) 5 MG TABS tablet Take 1 tablet (5 mg total) by mouth 2 (two) times daily. 02/25/24   Doretha Folks, MD  aspirin  EC 81 MG tablet Take 1 tablet (81 mg total) by mouth daily. Swallow whole. 01/10/24   Tharon Lung, MD  calcium  elemental as carbonate (TUMS ULTRA 1000) 400 MG chewable tablet Chew 1,000 mg by mouth daily as needed for heartburn. Patient taking differently: Chew 1,000 mg by mouth daily.    [provider]  cinacalcet  (SENSIPAR ) 30 MG tablet Take 1 tablet (30 mg total) by mouth daily. 01/10/24   Tharon Lung, MD  cyclobenzaprine  (FLEXERIL ) 5 MG tablet Take 1 tablet (5 mg total) by mouth 3 (three) times daily as needed for muscle spasms. 01/10/24   Tharon Lung, MD  diphenhydrAMINE  (BENADRYL ) 25 MG tablet Take 25 mg by mouth every 6 (six) hours as needed for itching.    [provider]  ferric citrate  (AURYXIA ) 1 GM 210 MG(Fe) tablet Take 2-4 tablets (420-840 mg total) by mouth See admin instructions. Take 4 tablets (840 mg) by mouth with  each meal & take 2 tablets (420 mg) by mouth with each snack. 01/25/24   Suknaim, [place] B, DO  gabapentin  (NEURONTIN ) 300 MG capsule Take 1 capsule (300 mg total) by mouth at bedtime. 01/10/24   Tharon Lung, MD  LANTUS  100 UNIT/ML injection Inject 0.05-0.08 mLs (5-8 Units total) into the skin 2 (two) times daily. 01/10/24   Tharon Lung, MD  levothyroxine  (SYNTHROID ) 150 MCG tablet TAKE 1 TABLET BY MOUTH ONCE DAILY BEFORE BREAKFAST 02/19/24   Suknaim, Houston B, DO  midodrine  (PROAMATINE ) 5 MG tablet Take 1 tablet (5 mg total) by mouth Every Tuesday,Thursday,and Saturday with dialysis. 01/11/24   Tharon Lung, MD  multivitamin (RENA-VIT) TABS tablet Take 1 tablet by mouth at bedtime. 05/11/22   Dameron, Marisa, DO  rosuvastatin  (CRESTOR ) 20 MG tablet Take 1 tablet (20 mg total) by mouth daily. 01/10/24   Tharon Lung, MD     Family History  Problem Relation Age of Onset   Heart disease Mother    Hypertension Mother     Social History   Socioeconomic History   Marital status: Single    Spouse name: Not on file   Number of children: Not on file   Years of education: Not on file   Highest education level: Not on file  Occupational History   Not on file  Tobacco Use   Smoking status: Never   Smokeless tobacco: Never  Vaping Use   Vaping status: Never Used  Substance and Sexual Activity   Alcohol use: No   Drug use: Yes    Frequency: 2.0 times per week    Types: Marijuana    Comment: weekly   Sexual activity: Yes  Other Topics Concern   Not on file  Social History Narrative   Not on file   Social Drivers of Health   Financial Resource Strain: Not on file  Food Insecurity: No Food Insecurity (09/29/2022)   Hunger Vital Sign    Worried About Running Out of Food in the Last Year: Never true    Ran Out of Food in the Last Year: Never true  Transportation Needs: No Transportation Needs (09/29/2022)   PRAPARE - Administrator, Civil Service (Medical): No    Lack of  Transportation (Non-Medical): No  Physical Activity: Insufficiently  Active (07/07/2021)   Exercise Vital Sign    Days of Exercise per Week: 4 days    Minutes of Exercise per Session: 30 min  Stress: Stress Concern Present (08/06/2021)   [employer] of Occupational Health - Occupational Stress Questionnaire    Feeling of Stress : To some extent  Social Connections: Socially Isolated (06/07/2021)   Social Connection and Isolation Panel    Frequency of Communication with Friends and Family: More than three times a week    Frequency of Social Gatherings with Friends and Family: More than three times a week    Attends Religious Services: Never    Database administrator or Organizations: No    Attends Banker Meetings: Never    Marital Status: Never married    Review of Systems: A 12 point ROS discussed and pertinent positives are indicated in the HPI above.  All other systems are negative.  Review of Systems  Constitutional:  Negative for fever.  HENT:  Negative for congestion.   Respiratory:  Negative for cough and shortness of breath.   Cardiovascular:  Negative for chest pain.  Gastrointestinal:  Negative for abdominal pain.  Neurological:  Negative for headaches.  Psychiatric/Behavioral:  Negative for behavioral problems and confusion.     Vital Signs: BP (!) 157/78   Pulse 81   Temp 98 F (36.7 C) (Oral)   Resp 19   Ht 6' 2 (1.88 m)   Wt (!) 340 lb (154.2 kg)   SpO2 96%   BMI 43.65 kg/m     Physical Exam Vitals and nursing note reviewed.  Constitutional:      Appearance: He is well-developed. He is obese.  HENT:     Head: Normocephalic.  Cardiovascular:     Comments: RUE graft -/- Pulmonary:     Effort: Pulmonary effort is normal.  Musculoskeletal:        General: Normal range of motion.     Cervical back: Normal range of motion.  Skin:    General: Skin is dry.  Neurological:     Mental Status: He is alert and oriented to person, place, and  time.     Imaging: VAS US  DUPLEX DIALYSIS ACCESS (AVF, AVG) Result Date: 02/26/2024 DIALYSIS ACCESS Patient Name:  GERRY BLANCHFIELD  Date of Exam:   02/25/2024 Medical Rec #: [id number]      Accession #:    [id number] Date of Birth: [birth year]       Patient Gender: M Patient Age:   13 years Exam Location:  [facility] Procedure:      VAS US  DUPLEX DIALYSIS ACCESS (AVF, AVG) Referring Phys: NORMAN SERVE --------------------------------------------------------------------------------  Reason for Exam: ESRD. Access Site: Right Upper Extremity. Access Type: Brachial artery HeRO graft. History: Patient had HeRO graft declotted by IR on 02/21/2024, patient states he          can't feel thrill and came to have access evaluated for re-occlusion. Comparison Study: No previous exams Performing Technologist: Jody Hill RVT, RDMS  Examination Guidelines: A complete evaluation includes B-mode imaging, spectral Doppler, color Doppler, and power Doppler as needed of all accessible portions of each vessel. Unilateral testing is considered an integral part of a complete examination. Limited examinations for reoccurring indications may be performed as noted.  Findings:   +--------------------+----------+-----------------+--------+ AVG                 PSV (cm/s)Flow Vol (mL/min)Describe +--------------------+----------+-----------------+--------+ Native artery inflow    54  58                 +--------------------+----------+-----------------+--------+ Arterial anastomosis                           occluded +--------------------+----------+-----------------+--------+ Prox graft                                     occluded +--------------------+----------+-----------------+--------+ Mid graft                                      occluded +--------------------+----------+-----------------+--------+ Distal graft                                   occluded  +--------------------+----------+-----------------+--------+ Venous anastomosis                             occluded +--------------------+----------+-----------------+--------+ Venous outflow                                 occluded +--------------------+----------+-----------------+--------+ Subclavian vein and IJV are patent, however both show continuous flow.  Summary: Brachial artery HeRO graft is occluded in it's entirety  *See table(s) above for measurements and observations.  Diagnosing physician: Gaile New MD Electronically signed by Gaile New MD on 02/26/2024 at 8:17:47 AM.    --------------------------------------------------------------------------------   Final    IR THROMBECTOMY AV FISTULA/W THROMBOLYSIS/PTA INC SHUNT/IMG RIGHT Result Date: 02/21/2024 INDICATION: 33 year old male with end-stage renal disease on hemodialysis via a right upper extremity HERO graft. His graft was placed in June of this year and had been working well until this week when it suddenly thrombosed. He presents for declot. EXAM: 1. Ultrasound-guided antegrade venous puncture 2. Ultrasound-guided second retrograde venous puncture 3. Hemodialysis graft declot with angioplasty MEDICATIONS: 4 mg tPA ANESTHESIA/SEDATION: Moderate (conscious) sedation was employed during this procedure. A total of Versed  2 mg and Fentanyl  100 mcg was administered intravenously by the radiology nurse. Total intra-service moderate Sedation Time: 50 minutes. The patient's level of consciousness and vital signs were monitored continuously by radiology nursing throughout the procedure under my direct supervision. FLUOROSCOPY: Radiation Exposure Index (as provided by the fluoroscopic device): 86 mGy Kerma COMPLICATIONS: None immediate. PROCEDURE: Informed written consent was obtained from the patient after a thorough discussion of the procedural risks, benefits and alternatives. All questions were addressed. Maximal Sterile Barrier  Technique was utilized including caps, mask, sterile gowns, sterile gloves, sterile drape, hand hygiene and skin antiseptic. A timeout was performed prior to the initiation of the procedure. The hemodialysis circuit was evaluated with ultrasound. The graft leading up to the HERO component is completely thrombosed. The arterial anastomosis is patent although an arterial plug is present just within the graft. An image was obtained and stored for the medical record. Local anesthesia was attained by infiltration with 1% lidocaine . A small dermatotomy was made. Under real-time sonographic guidance, the vessel was punctured in antegrade fashion directed toward the venous outflow with a 21 gauge micropuncture needle. Using standard technique, the initial micro needle was exchanged over a 0.018 micro wire for a transitional 4 [place] micro sheath. 2 mg tPA was then injected through  the micro sheath. Attention was next turned to the retrograde puncture. Further up the arm, the same technique was used to obtain and ultrasound-guided access of the dialysis graft in a retrograde fashion directed toward the arterial anastomosis. The vessel was punctured under real-time ultrasound guidance with a 21 gauge micropuncture needle. The micro needle was then exchanged over a 0.018 microwire for a transitional 4 [place] micro sheath. An additional 2 mg tPA was injected through the micro sheath. Attention was turned back to the initial antegrade puncture. The micro sheath was exchanged over a 0.035 wire for a 6 French vascular sheath. An angled catheter and Glidewire were navigated through the graft, through the right heart and into the inferior vena cava. An exchange length Rosen wire was placed in the IVC. The angled catheter was removed. Next, a 6 x 100 mm mustang angioplasty balloon was advanced over the wire. The balloon was inflated in serial overlapping stations affectively angioplasty the entirety of the graft segment. There were  several areas of moderate stenosis likely due to thrombus within the graft. The balloon was then deflated and removed. Next, a 6 [place] angio vac device was advanced over the wire and into the graft. Rheolytic thrombectomy was then performed throughout the graft all the way to the tip of the hero device in the right atrium. The thrombectomy device was removed. Attention was again turned to the retrograde sheath. Over a 0.035 wire the initial micro sheath was exchanged for a 7 [place] sheath. An angled catheter and Glidewire were used to navigate into the brachial artery. A 5 French Fogarty balloon was advanced over the wire. The balloon was used to pull the arterial plug. This maneuver was performed several times. Follow-up arteriography from the brachial artery demonstrates a widely patent arterial anastomosis and proximal aspect of the graft. There is some residual thrombus within the main body of the graft. Therefore, additional rheolytic thrombectomy and balloon maceration was performed. Follow-up venography through the antegrade sheath demonstrates complete restoration of flow with no residual thrombus or stenosis. Flow is present throughout the entirety of the HERO graft. There is a strong palpable thrill. Wires and devices were removed. Sheaths were removed and hemostasis attained with the assistance of 0 Prolene pursestring sutures. IMPRESSION: Successful declot procedure with complete restoration of flow. ACCESS: This access remains amenable to future percutaneous interventions as clinically indicated. Electronically Signed   By: Wilkie Lent M.D.   On: 02/21/2024 13:43    Labs:  CBC: Recent Labs    08/17/23 1225 08/17/23 2027 11/10/23 0619 01/10/24 1021  WBC  --  16.7*  --  9.4  HGB 14.3 10.6* 18.4* 16.3  HCT 42.0 33.2* 54.0* 49.5  PLT  --  173  --  252    COAGS: Recent Labs    08/17/23 2027  INR 1.3*    BMP: Recent Labs    08/17/23 1225 08/17/23 2027 11/10/23 0619  01/10/24 1021  NA 136 136 136 142  K 5.0 5.3* 4.5 5.0  CL 104 100 100 92*  CO2  --  20*  --  23  GLUCOSE 149* 278* 148* 110*  BUN 44* 32* 40* 41*  CALCIUM   --  9.2  --  11.4*  CREATININE 11.70* 11.21* 15.20* 13.53*  GFRNONAA  --  6*  --   --     LIVER FUNCTION TESTS: Recent Labs    08/17/23 2027  BILITOT 0.5  AST 22  ALT 11  ALKPHOS 54  PROT 8.2*  ALBUMIN  4.0    TUMOR MARKERS: No results for input(s): AFPTM, CEA, CA199, CHROMGRNA in the last 8760 hours.  Assessment and Plan:  33 y.o. male outpatient, Known to IR. History of ESRD withultiple AVF.  Currently recenrived HD via a right upper extremity brachial artery HeRO graft placed on 6.20.25. Patient presented to the ED at [facility] on 10.5.25 du concerns for a clotted graft.  Patient states that he typically takes eliquis  but was not able to take it for 4 day. 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 Patient presents for fistulogram with possible intervention.   PLAN: IR Image Guided Fistulogram with Possible Intervention.   Risks and benefits discussed with the patient including, but not limited to bleeding, infection, vascular injury, pulmonary embolism, need for tunneled HD catheter placement or even death.  All of the patient's questions were answered, patient is agreeable to proceed.  Consent signed and in chart.  Thank you for this interesting consult.  I greatly enjoyed meeting Keiandre Cygan and look forward to participating in their care.  A copy of this report was sent to the requesting provider on this date.  Electronically Signed: Delon JAYSON Beagle, NP 02/27/2024, 8:55 AM   I spent a total of  30 Minutes   in face to face in clinical consultation, greater than 50% of which was counseling/coordinating care for fistulogram possible intervention possible [facility] placement

## 2024-02-27 ENCOUNTER — Other Ambulatory Visit: Payer: Self-pay

## 2024-02-27 ENCOUNTER — Other Ambulatory Visit (HOSPITAL_COMMUNITY): Payer: Self-pay

## 2024-02-27 ENCOUNTER — Ambulatory Visit (HOSPITAL_COMMUNITY)
Admission: RE | Admit: 2024-02-27 | Discharge: 2024-02-27 | Disposition: A | Discharge status: Home / Self Care | Source: Ambulatory Visit

## 2024-02-27 DIAGNOSIS — N186 End stage renal disease: Secondary | ICD-10-CM | POA: Diagnosis not present

## 2024-02-27 DIAGNOSIS — I120 Hypertensive chronic kidney disease with stage 5 chronic kidney disease or end stage renal disease: Secondary | ICD-10-CM | POA: Insufficient documentation

## 2024-02-27 DIAGNOSIS — Y832 Surgical operation with anastomosis, bypass or graft as the cause of abnormal reaction of the patient, or of later complication, without mention of misadventure at the time of the procedure: Secondary | ICD-10-CM | POA: Diagnosis not present

## 2024-02-27 DIAGNOSIS — T82868A Thrombosis of vascular prosthetic devices, implants and grafts, initial encounter: Secondary | ICD-10-CM | POA: Insufficient documentation

## 2024-02-27 DIAGNOSIS — Z7901 Long term (current) use of anticoagulants: Secondary | ICD-10-CM

## 2024-02-27 DIAGNOSIS — Z992 Dependence on renal dialysis: Secondary | ICD-10-CM | POA: Insufficient documentation

## 2024-02-27 DIAGNOSIS — Z604 Social exclusion and rejection: Secondary | ICD-10-CM | POA: Insufficient documentation

## 2024-02-27 DIAGNOSIS — E1122 Type 2 diabetes mellitus with diabetic chronic kidney disease: Secondary | ICD-10-CM | POA: Insufficient documentation

## 2024-02-27 HISTORY — PX: IR THROMBECTOMY AV FISTULA W/THROMBOLYSIS/PTA INC/SHUNT/IMG RIGHT: IMG6119

## 2024-02-27 HISTORY — PX: IR US GUIDE VASC ACCESS RIGHT: IMG2390

## 2024-02-27 HISTORY — PX: IR AV DIALY SHUNT INTRO NEEDLE/INTRACATH INITIAL W/PTA/IMG RIGHT: IMG6116

## 2024-02-27 LAB — GLUCOSE, CAPILLARY: Glucose-Capillary: 114 mg/dL — ABNORMAL HIGH (ref 70–99)

## 2024-02-27 MED ORDER — ALTEPLASE 2 MG IJ SOLR
INTRAMUSCULAR | Status: AC | PRN
  Administered 2024-02-27: 4 mg

## 2024-02-27 MED ORDER — LIDOCAINE-EPINEPHRINE 1 %-1:100000 IJ SOLN
INTRAMUSCULAR | Status: AC
  Filled 2024-02-27: qty 1

## 2024-02-27 MED ORDER — IOHEXOL 300 MG/ML  SOLN
100.0000 mL | Freq: Once | INTRAMUSCULAR | Status: AC | PRN
  Administered 2024-02-27: 40 mL via INTRAVENOUS

## 2024-02-27 MED ORDER — HEPARIN SODIUM (PORCINE) 1000 UNIT/ML IJ SOLN
INTRAMUSCULAR | Status: AC
  Filled 2024-02-27: qty 10

## 2024-02-27 MED ORDER — SODIUM CHLORIDE 0.9 % IV SOLN: INTRAVENOUS | Status: DC

## 2024-02-27 MED ORDER — HEPARIN SODIUM (PORCINE) 1000 UNIT/ML IJ SOLN
INTRAMUSCULAR | Status: AC | PRN
Start: 2024-02-27 — End: 2024-02-27
  Administered 2024-02-27: 3000 [IU] via INTRAVENOUS

## 2024-02-27 MED ORDER — LIDOCAINE HCL 1 % IJ SOLN
20.0000 mL | Freq: Once | INTRAMUSCULAR | Status: AC
  Administered 2024-02-27: 10 mL via INTRADERMAL

## 2024-02-27 MED ORDER — FENTANYL CITRATE (PF) 100 MCG/2ML IJ SOLN
INTRAMUSCULAR | Status: AC
  Filled 2024-02-27: qty 2

## 2024-02-27 MED ORDER — ALTEPLASE 2 MG IJ SOLR
INTRAMUSCULAR | Status: AC
  Filled 2024-02-27: qty 4

## 2024-02-27 MED ORDER — CEFAZOLIN SODIUM-DEXTROSE 2-4 GM/100ML-% IV SOLN
INTRAVENOUS | Status: AC
  Filled 2024-02-27: qty 100

## 2024-02-27 MED ORDER — ATROPINE SULFATE 1 MG/10ML IJ SOSY
PREFILLED_SYRINGE | INTRAMUSCULAR | Status: AC
  Filled 2024-02-27: qty 10

## 2024-02-27 MED ORDER — MIDAZOLAM HCL 2 MG/2ML IJ SOLN
INTRAMUSCULAR | Status: AC | PRN
  Administered 2024-02-27: 1 mg via INTRAVENOUS
  Administered 2024-02-27 (×2): .5 mg via INTRAVENOUS

## 2024-02-27 MED ORDER — MIDAZOLAM HCL 2 MG/2ML IJ SOLN
INTRAMUSCULAR | Status: AC
  Filled 2024-02-27: qty 2

## 2024-02-27 MED ORDER — FENTANYL CITRATE (PF) 100 MCG/2ML IJ SOLN
INTRAMUSCULAR | Status: AC | PRN
  Administered 2024-02-27 (×4): 25 ug via INTRAVENOUS

## 2024-02-27 NOTE — Procedures (Signed)
 Interventional Radiology Procedure Note  Procedure: RUE HERO GRAFT DECLOT    Complications: None  Estimated Blood Loss:  MIN  Findings: RESTORED FLOW.  TO HAVE HD LATER TODAY.  2ND DECLOT IN 7 DAYS(NOT LONG TERM DURABLE ACCESS).  WHEN HIS ACCESS OCCLUDES NEXT EVALUATION IS VAS SUGERY(CAIN)    M. TREVOR Lionell Matuszak, MD

## 2024-02-27 NOTE — Progress Notes (Signed)
 Patient was given discharge instructions. He verbalized understanding.

## 2024-02-28 ENCOUNTER — Encounter (HOSPITAL_COMMUNITY): Payer: Self-pay | Admitting: Radiology

## 2024-02-28 ENCOUNTER — Other Ambulatory Visit (HOSPITAL_COMMUNITY): Payer: Self-pay

## 2024-02-28 DIAGNOSIS — T82868A Thrombosis of vascular prosthetic devices, implants and grafts, initial encounter: Secondary | ICD-10-CM

## 2024-02-28 DIAGNOSIS — N186 End stage renal disease: Secondary | ICD-10-CM

## 2024-02-28 DIAGNOSIS — Z7901 Long term (current) use of anticoagulants: Secondary | ICD-10-CM

## 2024-03-22 ENCOUNTER — Telehealth: Payer: Self-pay

## 2024-03-22 DIAGNOSIS — N186 End stage renal disease: Secondary | ICD-10-CM | POA: Diagnosis not present

## 2024-03-22 DIAGNOSIS — Z992 Dependence on renal dialysis: Secondary | ICD-10-CM | POA: Diagnosis not present

## 2024-03-22 DIAGNOSIS — E1129 Type 2 diabetes mellitus with other diabetic kidney complication: Secondary | ICD-10-CM | POA: Diagnosis not present

## 2024-03-22 MED ORDER — GABAPENTIN 100 MG PO CAPS: 100.0000 mg | ORAL_CAPSULE | Freq: Three times a day (TID) | ORAL | 3 refills | Status: AC

## 2024-03-22 NOTE — Telephone Encounter (Signed)
 Patient calls nurse line requesting a refill on Gabapentin .   He reports he prefers the 100mg  taking (1) morning (1) midday and (1) at bedtime.   He is requesting a 90 day supply of this medication.   Scheduled an apt with PCP for 12/1.  Advised will forward to PCP.

## 2024-03-25 ENCOUNTER — Inpatient Hospital Stay: Admitting: Internal Medicine

## 2024-03-25 ENCOUNTER — Inpatient Hospital Stay

## 2024-03-25 ENCOUNTER — Encounter: Payer: Self-pay | Admitting: Radiology

## 2024-03-29 ENCOUNTER — Other Ambulatory Visit: Payer: Self-pay

## 2024-03-29 DIAGNOSIS — E1129 Type 2 diabetes mellitus with other diabetic kidney complication: Secondary | ICD-10-CM

## 2024-04-15 ENCOUNTER — Telehealth: Payer: Self-pay

## 2024-04-15 NOTE — Telephone Encounter (Signed)
 Received refill request for Accu-Chek Guide Test Strips from [facility] on [address] However, noticed in patient chart it was discontinued on 10/27/2022. Called patient to clarify if her needs a refill on the prescription. Patient stated that he does indeed need a refill on the Accu-Chek Guide Test Strips and to send request to [facility] on [address]  Harlene Reiter, CMA

## 2024-04-21 DIAGNOSIS — N186 End stage renal disease: Secondary | ICD-10-CM | POA: Diagnosis not present

## 2024-04-21 DIAGNOSIS — E1129 Type 2 diabetes mellitus with other diabetic kidney complication: Secondary | ICD-10-CM | POA: Diagnosis not present

## 2024-04-21 DIAGNOSIS — Z992 Dependence on renal dialysis: Secondary | ICD-10-CM | POA: Diagnosis not present

## 2024-04-22 ENCOUNTER — Ambulatory Visit

## 2024-04-24 MED ORDER — ACCU-CHEK GUIDE TEST VI STRP: ORAL_STRIP | 12 refills | Status: AC

## 2024-04-24 NOTE — Addendum Note (Signed)
 Addended by: CORALEE ELDER on: 04/24/2024 11:45 AM   Modules accepted: Orders

## 2024-04-24 NOTE — Addendum Note (Signed)
 Addended by: GLENDIA DAMIEN SQUIBB on: 04/24/2024 11:41 AM   Modules accepted: Orders

## 2024-04-24 NOTE — Telephone Encounter (Signed)
 Patient calls nurse line in regards to testing strips.   I do not see where testing strips were called in.   Will forward to PCP.

## 2024-05-01 ENCOUNTER — Ambulatory Visit

## 2024-05-02 ENCOUNTER — Other Ambulatory Visit (HOSPITAL_COMMUNITY): Payer: Self-pay

## 2024-05-02 MED ORDER — FERRIC CITRATE 1 GM 210 MG(FE) PO TABS
840.0000 mg | ORAL_TABLET | Freq: Three times a day (TID) | ORAL | 3 refills | Status: AC
  Filled 2024-05-02: qty 360, 30d supply, fill #0

## 2024-05-03 ENCOUNTER — Other Ambulatory Visit (HOSPITAL_COMMUNITY): Payer: Self-pay

## 2024-05-10 ENCOUNTER — Other Ambulatory Visit: Payer: Self-pay | Admitting: Family Medicine

## 2024-05-10 DIAGNOSIS — E785 Hyperlipidemia, unspecified: Secondary | ICD-10-CM

## 2024-05-10 MED ORDER — ROSUVASTATIN CALCIUM 20 MG PO TABS: 20.0000 mg | ORAL_TABLET | Freq: Every day | ORAL | 0 refills | Status: DC

## 2024-05-10 MED ORDER — ROSUVASTATIN CALCIUM 10 MG PO TABS: 10.0000 mg | ORAL_TABLET | Freq: Every day | ORAL | 0 refills | Status: AC

## 2024-05-10 NOTE — Telephone Encounter (Addendum)
 Rx sent On review, patient is ESRD on HD and should be on 10mg  dose of crestor  Dose adjusted, new rx sent in; asked pharmacy to discard 20mg  daily dose rx.  Paul JINNY Legions, MD

## 2024-05-10 NOTE — Addendum Note (Signed)
 Addended by: DONAH LAYMON PARAS on: 05/10/2024 05:18 PM   Modules accepted: Orders

## 2024-05-10 NOTE — Addendum Note (Signed)
 Addended by: Ebunoluwa Gernert C on: 05/10/2024 11:22 AM   Modules accepted: Orders
